# Patient Record
Sex: Male | Born: 1935
Health system: Southern US, Community
[De-identification: ages and names within clinical notes are randomized; demographics above are authoritative.]

## PROBLEM LIST (undated history)

## (undated) DIAGNOSIS — I10 Essential (primary) hypertension: Secondary | ICD-10-CM

## (undated) DIAGNOSIS — G2581 Restless legs syndrome: Secondary | ICD-10-CM

## (undated) DIAGNOSIS — H9190 Unspecified hearing loss, unspecified ear: Secondary | ICD-10-CM

## (undated) DIAGNOSIS — Z9289 Personal history of other medical treatment: Secondary | ICD-10-CM

## (undated) DIAGNOSIS — F411 Generalized anxiety disorder: Secondary | ICD-10-CM

## (undated) DIAGNOSIS — R0602 Shortness of breath: Secondary | ICD-10-CM

## (undated) DIAGNOSIS — K828 Other specified diseases of gallbladder: Secondary | ICD-10-CM

## (undated) DIAGNOSIS — F329 Major depressive disorder, single episode, unspecified: Secondary | ICD-10-CM

## (undated) DIAGNOSIS — C61 Malignant neoplasm of prostate: Secondary | ICD-10-CM

## (undated) DIAGNOSIS — F419 Anxiety disorder, unspecified: Secondary | ICD-10-CM

## (undated) DIAGNOSIS — R011 Cardiac murmur, unspecified: Secondary | ICD-10-CM

## (undated) DIAGNOSIS — G8929 Other chronic pain: Secondary | ICD-10-CM

## (undated) DIAGNOSIS — K279 Peptic ulcer, site unspecified, unspecified as acute or chronic, without hemorrhage or perforation: Secondary | ICD-10-CM

## (undated) DIAGNOSIS — K219 Gastro-esophageal reflux disease without esophagitis: Secondary | ICD-10-CM

## (undated) DIAGNOSIS — Z9181 History of falling: Secondary | ICD-10-CM

## (undated) DIAGNOSIS — Z789 Other specified health status: Secondary | ICD-10-CM

## (undated) DIAGNOSIS — C801 Malignant (primary) neoplasm, unspecified: Secondary | ICD-10-CM

## (undated) DIAGNOSIS — J849 Interstitial pulmonary disease, unspecified: Secondary | ICD-10-CM

## (undated) DIAGNOSIS — E785 Hyperlipidemia, unspecified: Secondary | ICD-10-CM

## (undated) DIAGNOSIS — R0789 Other chest pain: Secondary | ICD-10-CM

## (undated) DIAGNOSIS — K922 Gastrointestinal hemorrhage, unspecified: Secondary | ICD-10-CM

## (undated) DIAGNOSIS — E7800 Pure hypercholesterolemia, unspecified: Secondary | ICD-10-CM

## (undated) HISTORY — PX: OTHER SURGICAL HISTORY: SHX169

## (undated) HISTORY — DX: Malignant neoplasm of prostate: C61

## (undated) HISTORY — DX: Essential (primary) hypertension: I10

## (undated) HISTORY — DX: Pure hypercholesterolemia, unspecified: E78.00

## (undated) HISTORY — PX: ESOPHAGOGASTRODUODENOSCOPY: SHX1529

## (undated) HISTORY — DX: Other specified diseases of gallbladder: K82.8

## (undated) HISTORY — DX: Gastrointestinal hemorrhage, unspecified: K92.2

## (undated) HISTORY — PX: TONSILLECTOMY: SUR1361

---

## 1998-08-04 ENCOUNTER — Other Ambulatory Visit: Admission: RE | Admit: 1998-08-04 | Discharge: 1998-08-04 | Payer: Self-pay

## 2000-05-04 ENCOUNTER — Encounter: Payer: Self-pay | Admitting: Emergency Medicine

## 2000-05-04 ENCOUNTER — Emergency Department (HOSPITAL_COMMUNITY): Admission: EM | Admit: 2000-05-04 | Discharge: 2000-05-04 | Payer: Self-pay | Admitting: Emergency Medicine

## 2000-05-07 ENCOUNTER — Inpatient Hospital Stay (HOSPITAL_COMMUNITY): Admission: EM | Admit: 2000-05-07 | Discharge: 2000-05-07 | Payer: Self-pay | Admitting: Urology

## 2000-05-07 ENCOUNTER — Encounter: Payer: Self-pay | Admitting: Urology

## 2000-08-14 ENCOUNTER — Other Ambulatory Visit: Admission: RE | Admit: 2000-08-14 | Discharge: 2000-08-14 | Payer: Self-pay | Admitting: Urology

## 2000-10-09 ENCOUNTER — Ambulatory Visit (HOSPITAL_COMMUNITY): Admission: RE | Admit: 2000-10-09 | Discharge: 2000-10-09 | Payer: Self-pay | Admitting: Urology

## 2000-10-09 ENCOUNTER — Encounter: Payer: Self-pay | Admitting: Urology

## 2000-10-13 ENCOUNTER — Ambulatory Visit: Admission: RE | Admit: 2000-10-13 | Discharge: 2001-01-11 | Payer: Self-pay | Admitting: Radiation Oncology

## 2001-07-23 ENCOUNTER — Encounter: Payer: Self-pay | Admitting: Family Medicine

## 2001-07-23 ENCOUNTER — Ambulatory Visit (HOSPITAL_COMMUNITY): Admission: RE | Admit: 2001-07-23 | Discharge: 2001-07-23 | Payer: Self-pay | Admitting: Family Medicine

## 2001-08-10 ENCOUNTER — Ambulatory Visit: Admission: RE | Admit: 2001-08-10 | Discharge: 2001-11-08 | Payer: Self-pay | Admitting: Radiation Oncology

## 2001-08-17 ENCOUNTER — Ambulatory Visit (HOSPITAL_COMMUNITY): Admission: RE | Admit: 2001-08-17 | Discharge: 2001-08-17 | Payer: Self-pay | Admitting: Radiation Oncology

## 2001-08-18 ENCOUNTER — Ambulatory Visit (HOSPITAL_COMMUNITY): Admission: RE | Admit: 2001-08-18 | Discharge: 2001-08-18 | Payer: Self-pay | Admitting: Radiation Oncology

## 2002-08-25 ENCOUNTER — Encounter: Payer: Self-pay | Admitting: Family Medicine

## 2002-08-25 ENCOUNTER — Ambulatory Visit (HOSPITAL_COMMUNITY): Admission: RE | Admit: 2002-08-25 | Discharge: 2002-08-25 | Payer: Self-pay | Admitting: Family Medicine

## 2003-05-23 ENCOUNTER — Ambulatory Visit (HOSPITAL_COMMUNITY): Admission: RE | Admit: 2003-05-23 | Discharge: 2003-05-23 | Payer: Self-pay | Admitting: Family Medicine

## 2003-06-11 HISTORY — PX: COLONOSCOPY: SHX174

## 2003-07-25 ENCOUNTER — Inpatient Hospital Stay (HOSPITAL_COMMUNITY): Admission: AD | Admit: 2003-07-25 | Discharge: 2003-07-28 | Payer: Self-pay | Admitting: Family Medicine

## 2003-08-18 ENCOUNTER — Ambulatory Visit (HOSPITAL_COMMUNITY): Admission: RE | Admit: 2003-08-18 | Discharge: 2003-08-18 | Payer: Self-pay | Admitting: Internal Medicine

## 2003-09-01 ENCOUNTER — Ambulatory Visit (HOSPITAL_COMMUNITY): Admission: RE | Admit: 2003-09-01 | Discharge: 2003-09-01 | Payer: Self-pay | Admitting: Internal Medicine

## 2004-03-07 ENCOUNTER — Ambulatory Visit (HOSPITAL_COMMUNITY): Admission: RE | Admit: 2004-03-07 | Discharge: 2004-03-07 | Payer: Self-pay | Admitting: Family Medicine

## 2004-11-19 ENCOUNTER — Ambulatory Visit (HOSPITAL_COMMUNITY): Admission: RE | Admit: 2004-11-19 | Discharge: 2004-11-19 | Payer: Self-pay | Admitting: Family Medicine

## 2005-03-08 ENCOUNTER — Inpatient Hospital Stay (HOSPITAL_COMMUNITY): Admission: AD | Admit: 2005-03-08 | Discharge: 2005-03-10 | Payer: Self-pay | Admitting: Family Medicine

## 2005-03-09 ENCOUNTER — Ambulatory Visit: Payer: Self-pay | Admitting: Internal Medicine

## 2005-03-15 ENCOUNTER — Ambulatory Visit (HOSPITAL_COMMUNITY): Admission: RE | Admit: 2005-03-15 | Discharge: 2005-03-15 | Payer: Self-pay | Admitting: Internal Medicine

## 2007-01-22 ENCOUNTER — Ambulatory Visit (HOSPITAL_COMMUNITY): Admission: RE | Admit: 2007-01-22 | Discharge: 2007-01-22 | Payer: Self-pay | Admitting: Family Medicine

## 2007-09-10 ENCOUNTER — Ambulatory Visit (HOSPITAL_COMMUNITY): Admission: RE | Admit: 2007-09-10 | Discharge: 2007-09-10 | Payer: Self-pay | Admitting: Family Medicine

## 2007-09-15 ENCOUNTER — Encounter: Admission: RE | Admit: 2007-09-15 | Discharge: 2007-09-15 | Payer: Self-pay | Admitting: Family Medicine

## 2007-09-29 ENCOUNTER — Encounter: Admission: RE | Admit: 2007-09-29 | Discharge: 2007-09-29 | Payer: Self-pay | Admitting: Family Medicine

## 2007-10-12 ENCOUNTER — Encounter: Admission: RE | Admit: 2007-10-12 | Discharge: 2007-10-12 | Payer: Self-pay | Admitting: Family Medicine

## 2007-12-31 ENCOUNTER — Encounter (INDEPENDENT_AMBULATORY_CARE_PROVIDER_SITE_OTHER): Payer: Self-pay | Admitting: Family Medicine

## 2007-12-31 ENCOUNTER — Ambulatory Visit (HOSPITAL_COMMUNITY): Admission: RE | Admit: 2007-12-31 | Discharge: 2007-12-31 | Payer: Self-pay | Admitting: Family Medicine

## 2008-09-19 ENCOUNTER — Encounter (HOSPITAL_COMMUNITY): Admission: RE | Admit: 2008-09-19 | Discharge: 2008-10-19 | Payer: Self-pay | Admitting: Urology

## 2010-06-30 ENCOUNTER — Encounter (INDEPENDENT_AMBULATORY_CARE_PROVIDER_SITE_OTHER): Payer: Self-pay | Admitting: Internal Medicine

## 2010-07-01 ENCOUNTER — Encounter: Payer: Self-pay | Admitting: Family Medicine

## 2010-08-15 ENCOUNTER — Ambulatory Visit (HOSPITAL_COMMUNITY)
Admission: RE | Admit: 2010-08-15 | Discharge: 2010-08-15 | Disposition: A | Discharge status: Home / Self Care | Payer: Medicare Other | Source: Ambulatory Visit | Attending: Family Medicine | Admitting: Family Medicine

## 2010-08-15 ENCOUNTER — Other Ambulatory Visit (HOSPITAL_COMMUNITY): Payer: Self-pay | Admitting: Family Medicine

## 2010-08-15 DIAGNOSIS — Z8546 Personal history of malignant neoplasm of prostate: Secondary | ICD-10-CM | POA: Insufficient documentation

## 2010-08-15 DIAGNOSIS — K5730 Diverticulosis of large intestine without perforation or abscess without bleeding: Secondary | ICD-10-CM | POA: Insufficient documentation

## 2010-08-15 DIAGNOSIS — R109 Unspecified abdominal pain: Secondary | ICD-10-CM | POA: Insufficient documentation

## 2010-08-15 DIAGNOSIS — R58 Hemorrhage, not elsewhere classified: Secondary | ICD-10-CM

## 2010-08-15 DIAGNOSIS — N323 Diverticulum of bladder: Secondary | ICD-10-CM | POA: Insufficient documentation

## 2010-08-15 DIAGNOSIS — R933 Abnormal findings on diagnostic imaging of other parts of digestive tract: Secondary | ICD-10-CM | POA: Insufficient documentation

## 2010-08-15 DIAGNOSIS — N200 Calculus of kidney: Secondary | ICD-10-CM | POA: Insufficient documentation

## 2010-08-15 DIAGNOSIS — K429 Umbilical hernia without obstruction or gangrene: Secondary | ICD-10-CM | POA: Insufficient documentation

## 2010-08-15 DIAGNOSIS — K625 Hemorrhage of anus and rectum: Secondary | ICD-10-CM | POA: Insufficient documentation

## 2010-08-15 MED ORDER — IOHEXOL 300 MG/ML  SOLN: 100.0000 mL | Freq: Once | INTRAMUSCULAR | Status: AC | PRN

## 2010-09-03 ENCOUNTER — Encounter (INDEPENDENT_AMBULATORY_CARE_PROVIDER_SITE_OTHER): Payer: Self-pay | Admitting: *Deleted

## 2010-09-04 ENCOUNTER — Ambulatory Visit: Admitting: Gastroenterology

## 2010-09-11 NOTE — Letter (Signed)
Summary: Generic Letter, Intro to Referring  [facility]  [address]   [place], [place] [address]   Phone: [phone]  Fax: [phone]      September 03, 2010             RE: Jeffrey Sherman   [birth year]                 [address]                 [place], [place]  [address]  Dear Kemper Durie,  Pt has cancelled his appointment for tomorrow 09/04/10 @ 1030 w/LSL            Sincerely,    Diana Eves  [facility] Ph: [phone]   Fax: [phone]

## 2010-10-26 NOTE — Op Note (Signed)
[facility]  Patient:    Jeffrey Sherman, Jeffrey Sherman                        MRN: [id number] Proc. Date: 05/07/00 Adm. Date:  [id number] Disc. Date: [id number] Attending:  Benny Lennert                           Operative Report  PREOPERATIVE DIAGNOSIS:  Left distal ureteral calculus.  POSTOPERATIVE DIAGNOSIS:  Left distal ureteral calculus.  PROCEDURE PERFORMED:  Cystoscopy, ureteroscopy, stone basketing, and double-J stent placement.  SURGEON:  Barron Alvine, M.D.  ANESTHESIA:  General endotracheal.  INDICATIONS:  The patient is a 77 year old male.  He was recently evaluated in our office due to a distal left ureteral stone.  The stone was 4 mm in size and recommended continued efforts at spontaneous passage.  He contacted [place] today stating he could no longer stand the pain.  For that reason, we admitted him to [facility].  X-rays showed continued presence of a distal ureteral stone.  The patient requested intervention.  He understood the advantages, disadvantages, as well as full risks, and full informed consent was obtained.  DESCRIPTION OF PROCEDURE AND FINDINGS:  The patient was brought to the operating room where he had the successful induction of general endotracheal anesthesia.  He was placed in the lithotomy position, prepped and draped in the usual manner.  Cystoscopy revealed a relatively unremarkable prostatic urethra.  The patient did have considerable edema of the left hemitrigone.  At the ureteral orifice, one could see a stone just proximal.  For that reason, retrograde pyelogram was not performed.  With some manipulation, a guidewire was able to be passed beyond the stone and into the left renal unit with fluoroscopic guidance.  We then utilized a 6.5-French rigid ureteroscope.  We were able to insert this into the distal ureter without the need for dilation. A 4 mm hard stone was encountered.  This was basket extracted  without difficulty.  The stone appeared to be ________ composition.  Over the guidewire, a 24 cm, 6-French double-J stent was placed.  A dangle string was left which was secured to the penis.  The stent was placed with visual as well as fluoroscopic guidance and appeared to be in good position.  The patient tolerated the procedure well.  He was brought to the recovery room in stable condition. DD:  05/07/00 TD:  05/07/00 Job: [id number]

## 2010-10-26 NOTE — H&P (Signed)
NAME:  Jeffrey Sherman, Jeffrey Sherman                         ACCOUNT NO.:  [id number]   MEDICAL RECORD NO.:  [id number]                  PATIENT TYPE:   LOCATION:  339                                  FACILITY:  [facility]   PHYSICIAN:  Patrica Duel, M.D.                 DATE OF BIRTH:  Dec 29, 1929   DATE OF ADMISSION:  DATE OF DISCHARGE:                                HISTORY & PHYSICAL   CHIEF COMPLAINT:  Diarrhea.   HISTORY OF PRESENT ILLNESS:  This is a 77 year old male with a history of  hypertension, disc disease, osteoarthritis, and prostate carcinoma medically  treated.  He has been quite stable over the past several years.  He suffered  the loss of his wife approximately 2 years ago and suffered from depression  but this has been well controlled of recent.   The patient presented to the office on July 19, 2003 with a 5- to 6-day  history of diarrhea.  This consisted of 4-5 watery stools daily.  He has had  ongoing abdominal cramping.  Of note, the patient had taken a course of  amoxicillin approximately 7 days prior to the onset of diarrhea.  He denied  fever, chills, melena, or hematemesis.   The patient was worked up with CBC which revealed a mild anemia (hemoglobin  11.3) with a prior hemoglobin check in June 2004 of 13.  Chemistries WRE  normal.  Stool for C. difficile, O&P negative; C&S is negative for  suspicious cultures at this time - final report is pending.   The patient was treated empirically with Flagyl and Imodium as well as  dietary instructions.  He has continued to experience 4-5 watery stools per  day without gross blood.  His only abdominal pain is of cramping quality.  He does note high-intensity borborygmi but is devoid of other complaints.  He is generally weak and is in need of admission for hydration and a full  workup of intractable diarrhea.  He is very reluctant to undergo colonoscopy  at this time though this is probably indicated.   There is no history of  headache, neurologic deficits, shortness of breath,  chest pain, or genitourinary symptoms.   PAST MEDICAL HISTORY:  As documented above.   CURRENT MEDICATIONS:  1. Terazosin 5 mg p.o. b.i.d.  2. Verapamil SR 120 one t.i.d.  3. Lisinopril 20 mg one q.i.d.  4. Hydrochlorothiazide 25 daily.  5. Celebrex 200 mg daily p.r.n. (not taken recently).  6. Potassium 20 mEq one b.i.d.  7. Aspirin 81 mg daily.  8. Effexor XR 75 mg daily.   SOCIAL HISTORY:  Nonsmoker, nondrinker.   FAMILY HISTORY:  Significant for coronary disease in his mother.  One  brother died of carcinoma, unknown origin.  One brother died of heart  attack.   REVIEW OF SYSTEMS:  Negative except as mentioned.   PHYSICAL EXAMINATION:  GENERAL:  A very pleasant white male  who is alert and  oriented, no acute distress.  HEENT:  His mucous membranes are mildly dehydrated.  Ears, nose, and throat  benign.  There is no scleral icterus.  NECK:  Supple, no bruits.  LUNGS:  Clear.  HEART:  Heart sounds are normal without murmurs, rubs, or gallops.  ABDOMEN:  Mildly hypertympanitic with increased bowel sounds.  There is mild  periumbilical tenderness only.  EXTREMITIES:  No clubbing, cyanosis, or edema.  NEUROLOGIC:  Within normal limits.   ASSESSMENT:  Intractable diarrhea with negative Clostridium difficile, ova  and parasites, culture and sensitivity, and mild anemia documented on recent  laboratory.  Admit for hydration, GI consult.  Will continue empiric Flagyl  and Lomotil.  Will follow and treat expectantly.     ___________________________________________                                         Patrica Duel, M.D.   MC/MEDQ  D:  07/25/2003  T:  07/25/2003  Job:  [id number]

## 2010-10-26 NOTE — Op Note (Signed)
Jeffrey Sherman, Jeffrey Sherman               ACCOUNT NO.:  [id number]   MEDICAL RECORD NO.:  [id number]          PATIENT TYPE:  INP   LOCATION:  A210                          FACILITY:  [facility]   PHYSICIAN:  R. Roetta Sessions, M.D. DATE OF BIRTH:  07-19-1929   DATE OF PROCEDURE:  03/09/2005  DATE OF DISCHARGE:                                 OPERATIVE REPORT   PROCEDURE:  Esophagogastroduodenoscopy with ablation arteriovenous  malformation and Maloney dilation.   ENDOSCOPIST:  Jonathon Bellows, M.D.   INDICATIONS FOR PROCEDURE:  The patient is a 77 year old gentleman who  experienced recent syncope recently in the setting of melena.  He was seen  by Dr. Phillips Odor and was found to have current jelly, grossly guaiac-positive  stool on rectal exam in his office.  He was admitted for further evaluation.  We have seen him.  He does take aspirin daily.  Admitting hemoglobin 12.9.  It is 11.5 this morning with INR of 1.2.  He has remained stable and has not  had any more blood per rectum.  He also reports intermittent esophageal  dysphagia to solids.  Colonoscopy in February 2005 demonstrated sigmoid  diverticula and hemorrhoids.  An EGD is now being done.  This procedure was  discussed with the patient and the patient's family at length.  Potential  risks, benefits, and alternatives have been reviewed and questioned  answered.   PROCEDURE NOTE:  Oxygen saturation, blood pressure, pulse, respirations were  monitored throughout the entirety of the procedure.   MEDICATIONS:  Conscious sedation:  Versed 3 mg IV, Demerol 25 mg IV in  divided doses.  Cetacaine spray for topical oropharyngeal anesthesia.   INSTRUMENT:  Olympus video chip system.   FINDINGS:  Examination of the tubular esophagus revealed no mucosal  abnormalities.  There was no ring, stricture, or other abnormality.  The EG  junction was easily traversed to enter the stomach and gastric cavity.  The  stomach inflated well with air.  It was  empty.  A thorough examination of  the gastric mucosa, including retroflexion view of the proximal stomach and  esophagogastric junction demonstrated only a solitary non-bleeding AVM in  the antrum.  The pylorus was patent and easily traversed. Examination of the  bulb and second portion appeared normal.   THERAPY/DIAGNOSTIC MANEUVERS PERFORMED:  The solitary AVM was ablated with  the Gold probe.  Subsequently a 56-French Maloney dilator was passed to full  insertion with good patient tolerance and without blood or trauma.  A look  back revealed no apparent complications related to the passage of the  dilator.  The patient tolerated the procedure well, and was reacted.   IMPRESSION:  1.  Normal esophagus, status post passage of a 56-French Maloney dilator.  2.  Solitary antral arteriovenous malformation (non-bleeding), otherwise      normal gastric mucosa, status post ablation of the arteriovenous      malformation as described above.  3.  Patent pylorus.  4.  Normal D1 and D2.   Today's findings do not explain the patient's GI bleed.  Would wonder about  the  possibility of a small bowel erosion, AVM, or even a slow diverticular  bleed to explain the bulk of his recent bleeding.  At any rate, his bleeding  seems to have cease.   RECOMMENDATIONS:  1.  Discontinue aspirin.  2.  Regular diet.  3.  Outpatient Gibbon small bowel  capsule study.  4.  Check hemoglobin and hematocrit tomorrow morning.      Jonathon Bellows, M.D.  Electronically Signed     RMR/MEDQ  D:  03/09/2005  T:  03/09/2005  Job:  [id number]   cc:   Corrie Mckusick, M.D.  Fax: [phone]

## 2010-10-26 NOTE — Consult Note (Signed)
Jeffrey Sherman, Jeffrey Sherman               ACCOUNT NO.:  [id number]   MEDICAL RECORD NO.:  [id number]          PATIENT TYPE:  INP   LOCATION:  A210                          FACILITY:  [facility]   PHYSICIAN:  R. Roetta Sessions, M.D. DATE OF BIRTH:  May 26, 1930   DATE OF CONSULTATION:  03/08/2005  DATE OF DISCHARGE:                                   CONSULTATION   CONSULTING PHYSICIAN:  R. Roetta Sessions, M.D.   REFERRING PHYSICIAN:  Corrie Mckusick, M.D.   REASON FOR CONSULTATION:  GI bleed.   HISTORY OF PRESENT ILLNESS:  The patient is a 77 year old Caucasian  gentleman who was admitted with syncope and GI bleed.  Approximately three  days ago, he developed two episodes of black tarry stools, followed by  bright red blood per rectum.  He had a syncopal episode.  Yesterday he had  another episode of fresh blood per rectum.  He presented to Dr. Lamar Blinks  office this morning for evaluation.  Rectal examination, by Melony Overly,  P.A., revealed fresh blood per rectum.  He was admitted for further  evaluation.  Before Tuesday, the patient was having regular bowel movements  and no abdominal pain until he developed tarry stools.  He describes mid  abdominal tenderness.  He also is having gas pains.  He denies any  heartburn.  He had a single episode of vomiting Tuesday as well.  He denies  any hematemesis.  He has intermittent esophageal dysphagia.  He has  difficulty swallowing solid foods intermittently and gets __________  with  liquids occasionally.  He complains of about a one to two-month history of  palpitations, denies any chest pain.   MEDICATIONS PRIOR TO ADMISSION:  1.  Terazosin 5 mg, two tablets b.i.d.  2.  Lisinopril 20 mg every day.  3.  Potassium chloride 20 mEq every day.  4.  Effexor XR 75 mg every day.  5.  Prevacid 30 mg every day.  6.  Aspirin 81 mg every day.  7.  __________  500 mg two p.r.n. but rarely takes for pain.  8.  Verapamil SR 240 mg every day.  9.   Hydrochlorothiazide 25 mg every day.  10. Garlique every day.   ALLERGIES:  No known drug allergies.   PAST MEDICAL HISTORY:  1.  He was in the hospital, in February 2005, for diarrhea.  At that time,      he had a colonoscopy, by Dr. Karilyn Cota, which revealed sigmoid      diverticulosis, hemorrhoids, a few focal areas of nonspecific mucosal      erythema.  2.  In March 2005, a GI series with small-bowel follow-through which      revealed a single episode of gastroesophageal reflux.  The patient states his diarrhea has resolved.  1.  Hypertension.  2.  Osteoarthritis.  3.  Prostate cancer, diagnosed in 2002.  He receives Lupron injections every      four months.  4.  History of bleeding duodenal ulcer in the 1980s, treated by Dr. Park Breed,      according to the patient.  5.  Left ureteral stone, status post cystoscopy.   FAMILY HISTORY:  Brother with prostate cancer.   SOCIAL HISTORY:  He is widowed.  He has three children locally.  He has one  son he has not heard from in over five years.  Denies any tobacco or alcohol  use.   REVIEW OF SYSTEMS:  See HPI for GI and cardiopulmonary.  CONSTITUTIONAL:  Denies any weight loss.   PHYSICAL EXAMINATION:  VITAL SIGNS:  Weight 174.4, temp 97.3, pulse 63,  respirations 20, blood pressure 130/70.  GENERAL:  A pleasant, well nourished, well developed, Caucasian male in no  acute distress.  SKIN:  Warm and dry.  No jaundice.  HEENT:  Sclerae nonicteric.  Oropharyngeal mucosa moist and pink.  No  lymphadenopathy.  CHEST:  Lungs are clear to auscultation.  CARDIAC:  Reveals a regular rate and rhythm.  No murmurs, rubs, or gallops.  ABDOMEN:  Positive bowel sounds.  Soft, nontender, nondistended.  No  organomegaly or masses.  No rebound tenderness or guarding.  No abdominal  bruits or hernias.  EXTREMITIES:  No edema.  RECTAL:  Done at the primary medical doctor's office today.  According to  the patient, he was found to have fresh blood per  rectum.   LABORATORY:  White count 14,500, hemoglobin 12.9, hematocrit 37.8, platelets  239,000.  Sodium 136, potassium 3.1, BUN 28, creatinine 1.7, glucose 117.  Total bilirubin 0.5, alkaline phosphatase 53, AST 20, ALT 15, albumin 3.5.  Cardiac enzymes negative x1.  Chest x-ray revealed an area of chronic  atelectasis, infiltrate, or scarring inferior medial aspect of the right  middle lobe which was seen on prior CT in March 2005, a moderate degree of  COPD.   IMPRESSION:  The patient is a 77 year old gentleman who had two episodes of  melena, followed by fresh blood per rectum and syncope.  He had fresh blood  per rectum again yesterday.  His hemoglobin is mildly low.  He gives a  history of bleeding duodenal ulcer in the past.  He is on Prevacid and  aspirin daily.  Given a history of melena, would suspect upper  gastrointestinal bleed.  Reassuringly he had a colonoscopy, February 2005,  which revealed sigmoid diverticula and hemorrhoids.   RECOMMENDATIONS:  1.  IV Protonix.  2.  Replete potassium.  3.  Repeat CBC in the morning.  4.  EGD.  Further recommendations to follow.   I would like to thank Dr. Assunta Found for allowing [place] to take part in the  care of this patient.      Tana Coast, P.AJonathon Bellows, M.D.  Electronically Signed    LL/MEDQ  D:  03/08/2005  T:  03/08/2005  Job:  [id number]

## 2010-10-26 NOTE — H&P (Signed)
NAME:  Jeffrey Sherman,  NO.:  [id number]   MEDICAL RECORD NO.:  [id number]          PATIENT TYPE:  INP   LOCATION:  A210                          FACILITY:  [facility]   PHYSICIAN:  Corrie Mckusick, M.D.  DATE OF BIRTH:  Feb 04, 1930   DATE OF ADMISSION:  03/08/2005  DATE OF DISCHARGE:  LH                                HISTORY & PHYSICAL   Mr. Canterbury is a 77 year old white male who is being admitted from our office  for rectal bleeding, weakness, and syncope for three days.   HISTORY OF PRESENT ILLNESS:  Mr. Kubisiak was seen in our office, on March 07, 2005, for the above symptoms which began on March 05, 2005.  He  states that he passed out one time at home and had an unknown length of time  of loss of consciousness.  He has gotten progressively weaker over the past  several days or so and has noticed bright red rectal bleeding also for 2-3  days.  He also reported some palpitations without chest pain at the office  visit on March 07, 2005, and also nausea and vomiting x1.  He was worked  up in our office but adamantly refused admission yesterday but agreed to go  in today.  He is thus being admitted directly from our office for further  evaluation and treatment.   PAST MEDICAL HISTORY:  1.  Hypertension.  2.  Prostate cancer, diagnosed in 1998 for which he is on Lupron injections      every four months.  3.  GI bleed in the 1980s.  4.  Rectal bleeding in February 2005 and was admitted to [facility]      for the same.  He was worked up and evaluated by Dr. Karilyn Cota with      colonoscopy revealing sigmoid diverticula and moderate sized external      hemorrhoids.  5.  Osteoarthritis.   SURGICAL HISTORY:  Negative.   FAMILY HISTORY:  Noncontributory.   SOCIAL HISTORY:  He is a widower for approximately three years.  He is  accompanied by one of his daughters today.  Of note, it was very important  to him that he eat out with his family last  evening and that is why he was  adamant about not being admitted yesterday.  He denies any tobacco or  alcohol use.   REVIEW OF SYSTEMS:  He denies any sore throat, earache, no headaches, no  blurred vision, no dizziness, no chest pain, but has had palpitations off  and on for several months worsening in the past week or so.  No shortness of  breath, cough, hemoptysis.  He has had some diarrhea over the past several  days associated with the bright red bleeding on the toilet paper and in the  toilet, last episode was on March 07, 2005, in the morning and at the  time of admission he has had no further diarrhea stools.  No nausea or  vomiting at this time.  No bladder problems.  No muscle weakness joint pain,  no fevers, weight  loss, or other constitutional symptoms.   CURRENT MEDICATIONS:  1.  Verapamil SR 240 mg p.o. every day.  2.  HCTZ 25 mg p.o. every day.  3.  Hytrin 5 mg.  He normally takes one p.o. b.i.d. p.r.n. for blood      pressure but is allowed to take as many as four in the morning and two      in the evening, according to the patient and his daughter.  4.  Lisinopril 20 mg, two p.o. q.a.m.  5.  Potassium XR 20 mEq two p.o. every day.  6.  Effexor 75 mg, two every day.  7.  Prevacid 30 mg p.o. every day.  8.  Aspirin 81 mg p.o. every day, last taken on March 06, 2005.   He has no known drug allergies.   PHYSICAL EXAMINATION:  GENERAL:  He is alert and oriented, very pleasant,  elderly male accompanied by his daughter in no acute distress.  VITAL SIGNS:  He is afebrile, pulse is 64, BP is 100/62, weight is 180 up  from 177 yesterday in our office.  HEENT:  PERRLA.  EOMI.  Conjunctivae are pale.  Pharynx is within normal  limits.  NECK:  Supple without adenopathy.  HEART:  Regular rate and rhythm without murmur, gallop or rub.  LUNGS:  Clear to auscultation without crackles, wheezes, rales, or rhonchi.  ABDOMEN:  Bowel sounds are positive, soft.  He has  generalized mild  tenderness all throughout the four quadrants.  No masses or organomegaly are  palpable.  EXTREMITIES:  No edema, cyanosis, or clubbing.  BACK:  Negative for CVA tenderness.  SKIN:  Warm and dry.  NEURO:  CN II-XII are grossly normal.  Gait is normal.  RECTAL:  No obvious hemorrhoids externally.  Rectal tone is within normal  limits.  He does have bright red blood obvious on my glove after exam  currant jelly type and guaiac is positive.   LABORATORY:  On March 07, 2005, hematocrit, in our office, was 42%.  EKG, on March 07, 2005, revealed anterolateral ST changes as compared to  an old EKG from our office 2003.   ASSESSMENT:  1.  Rectal bleeding.  2.  Syncope x1.  3.  History of gastrointestinal bleed in 2005 and earlier in the 1980s.  4.  Hypertension.  5.  Prostate cancer, under the care of urology on Lupron injections every      four months.   PLAN:  1.  He is admitted to Dr. Phillips Odor.  2.  CBC, C-MET, troponin level, and cardiac enzymes on admission.  3.  Consult Dr. Karilyn Cota.  4.  Telemetry monitoring.  5.  Continue all above current medications.  6.  [facility] standing orders.  7.  Clear liquid diet.  8.  Obtain chest x-ray on admission.  9.  Coordination of care is provided.      Melony Overly, [place]      Corrie Mckusick, M.D.  Electronically Signed    TH/MEDQ  D:  03/08/2005  T:  03/08/2005  Job:  [id number]   cc:   Lionel December, M.D.  [address]  [place]  [place] [address]   Ky Barban, M.D.  Fax: [phone]

## 2010-10-26 NOTE — Op Note (Signed)
NAME:  MATTHER, LABELL                        ACCOUNT NO.:  [id number]   MEDICAL RECORD NO.:  [id number]                    PATIENT TYPE:  PINP   LOCATION:  339                                  FACILITY:   PHYSICIAN:  Lionel December, M.D.                 DATE OF BIRTH:   DATE OF PROCEDURE:  07/27/2003  DATE OF DISCHARGE:                                 OPERATIVE REPORT   PROCEDURE:  Total colonoscopy.   ENDOSCOPIST:  Lionel December, M.D.   INDICATIONS:  This patient is a 77 year old Caucasian male admitted with  about a 10-day history of diarrhea which now reportedly has become bloody.  A stool study has been negative including C. difficile toxin titer.  He was  recently treated with antibiotic; however, he has not responded to Flagyl.  He is now having bloody diarrhea.  The procedure and risks were reviewed  with the patient and informed consent was obtained.   PREOPERATIVE MEDICATIONS:  Demerol 50 mg IV and Versed 8 mg IV in divided  dose.   FINDINGS:  Procedure performed in endoscopy suite.  The patient's vital  signs and O2 saturation were monitored during the procedure and remained  stable.  The patient was placed in the left lateral recumbent position and  rectal examination was performed.  No abnormality noted on external or  digital exam.   Olympus videoscope was placed in the rectum and advanced under vision into  the sigmoid colon where multiple diverticular were noted.  There are a few  focal areas of mucosal erythema, but no erosions, ulcers, or membranes were  noted.  This yellowish-reddish material was noted throughout the colon.  Sample was taken and guaiac test was done and was negative. I felt that this  was red-yellow and not blood.   The scope was passed all the way to the cecum which was identified by  appendiceal orifice and the ileocecal valve.  Pictures were taken for the  record.  As the scope was withdrawn, the colonic mucosa was once again  carefully  examined and there were no endoscopic changes of colitis.  There  were also no polyps and/or tumor masses.  The rectal mucosa was normal.  The  scope was retroflexed to exam the anorectal junction and moderate sized  hemorrhoids were noted below the dentate line.   The endoscope was straightened and withdrawn.  The patient tolerated the  procedure well.   FINAL DIAGNOSES:  1. No endoscopic evidence of colitis.  2. Sigmoid colon diverticulosis and moderate size external hemorrhoids.  3. A few focal areas of mucosal erythema which is felt to be a nonspecific     finding and would not explain his diarrhea.   This is possibly small bowel diarrhea or post infectious viral.   RECOMMENDATIONS:  1. Will advance his diet to 4 gm sodium and start him on __________ 2 mg  p.o. q.i.d. Will start him on Metamucil, etc. at a later date.  2. Will discontinue his Questran and Lactinex.  3. CBC and MET-7 will be checked in the morning along with serum magnesium.      ___________________________________________                                            Lionel December, M.D.   NR/MEDQ  D:  07/27/2003  T:  07/28/2003  Job:  [id number]   cc:   Angus G. Renard Matter, M.D.  [address]  [place]  [place] [address]  Fax: [phone]

## 2010-10-26 NOTE — Discharge Summary (Signed)
NAME:  Jeffrey Sherman, Jeffrey Sherman NO.:  [id number]   MEDICAL RECORD NO.:  [id number]          PATIENT TYPE:  INP   LOCATION:  A210                          FACILITY:  [facility]   PHYSICIAN:  Corrie Mckusick, M.D.  DATE OF BIRTH:  [birth year]   DATE OF ADMISSION:  03/08/2005  DATE OF DISCHARGE:  10/01/2006LH                                 DISCHARGE SUMMARY   For history of present illness and past medical history, please see  admission H&P.   HOSPITAL COURSE:  A 77 year old gentleman with hypertension, prostate  cancer, GI bleed in the 1980s with a rectal bleed in February of 2005, and  osteoarthritis who presented to the office with recurrent GI bleeding. He  was seen by our physician's assistance who did the initial H&P. She was  admitted with cardiac enzymes as well as initial blood work. Dr. Karilyn Cota was  consulted on admission as well, and he was kept on a clear diet. Please see  Dr. Luvenia Starch note on admission for initial consultation details. EGD and  colonoscopy if needed on the day after admission.   The following day, the patient felt quite well. IV fluids were continued.  Potassium was increased as it had decreased to 2.8. A small amount of AVMs  were seen on EGD. Advanced the patient to a regular diet. Decided for an  outpatient capsule study. Hemoglobin and hematocrit remained stable. The GI  specialist felt like the patient could be discharged on the first as he had  remained stable. He was discharged on potassium chloride. He was to follow  up with Dr. Sherwood Gambler in one week. Follow up with Dr. Jena Gauss in one week for  capsule study. Please see note on day of discharge for details.      Corrie Mckusick, M.D.  Electronically Signed     JCG/MEDQ  D:  05/01/2005  T:  05/01/2005  Job:  [id number]

## 2010-10-26 NOTE — Consult Note (Signed)
NAME:  Jeffrey Sherman, Jeffrey Sherman                         ACCOUNT NO.:  [id number]   MEDICAL RECORD NO.:  [id number]                   PATIENT TYPE:  INP   LOCATION:  A339                                 FACILITY:  [facility]   PHYSICIAN:  Jeffrey Sherman, M.D.              DATE OF BIRTH:  May 24, 1930   DATE OF CONSULTATION:  DATE OF DISCHARGE:                                   CONSULTATION   GI CONSULT   REQUESTING PHYSICIAN:  Jeffrey Sherman, M.D.   REASON FOR CONSULTATION:  Diarrhea.   HISTORY OF PRESENT ILLNESS:  This patient is a 77 year old Caucasian male  who was admitted today for a 2-week history of large volume, loose watery  diarrhea.  He was seen in outpatient consultation on February 2 by Dr.  Nobie Sherman.  At that point in time he was treated for C. difficile colitis and  stool studies were obtained.  He has received 5 days of Flagyl.  He has also  been using Imodium, Pepto and Lomotil with minimal relief.  He reports large  volume diarrhea 5-10 times a day most days, however, 2-3 times a day on  other days.  He denies any mucous, bleeding, or melena. Stool studies were  negative for ova and parasites.  Culture and sensitivity is initially  negative; however, final studies are pending.  C. difficile is initially  negative.  He does report associated abdominal cramping; however, this is  relieved post defecation.   PAST MEDICAL HISTORY:  He does have a past medical history of what he  believes were duodenal ulcers, diagnosed by Dr. Park Sherman in the 1980's; however,  do not have records of this.  He denies any nausea or vomiting.  He denies  any problems with dysphagia or odynophagia.  He has never had a colonoscopy.  Questionable duodenal ulcers in the 1980s by Dr. Park Sherman, prostate carcinoma  diagnosed 2-3 years ago.  Currently on Lupron for the last year.  Disk  disease, osteoarthritis, hypertension, situational depression when he lost  his wife approximately 2 years ago, hypokalemia.   PAST  SURGICAL HISTORY:  Denies.   MEDICATIONS PRIOR TO ADMISSION:  1. Terazosin 5 mg p.o. b.i.d.  2. Verapamil 120 mg t.i.d.  3. Lisinopril 20 p.o. q.i.d.  4. Hydrochlorothiazide 25 mg daily.  5. Celebrex 200 mg p.Jeffreyn. on a very intermittent infrequent basis.  6. KCl 20 mEq p.o. b.i.d.  7. Aspirin 81 mg daily.  8. Effexor XR 75 mg daily.  9. Pepto p.Jeffreyn.  10.      Lomotil p.Jeffreyn.  11.      Imodium p.Jeffreyn.   ALLERGIES:  No known drug allergies.   FAMILY HISTORY:  No known family history of colorectal carcinoma, or similar  GI problems.  Mother deceased at age 62 secondary to coronary artery  disease.  Father had an __________ disease secondary to unknown etiology.  He has 1 sister who is  healthy and 5 siblings who are deceased with  significant history for prostate carcinoma.   SOCIAL HISTORY:  Denies any alcohol, tobacco or drug use.  The patient is a  widower who lives alone.  He has 3 healthy, grown children.   REVIEW OF SYSTEMS:  CONSTITUTIONAL:  He denies any fever or chills.  Weight  has been steady.  Denies any changes in appetite.  CARDIOVASCULAR:  Denies  any chest pain or palpitations.  RESPIRATORY:  Denies any shortness of  breath, dyspnea, cough, or hemoptysis.  GENITOURINARY:  Denies any dysuria,  hematuria, increased urinary frequency.  SKIN:  Denies any rash or jaundice.   PHYSICAL EXAMINATION:  VITAL SIGNS:  Weight 184.4 pounds.  Height 69 inches.  Temperature 97.1, pulse 61, respirations 20, blood pressure 120/70.  GENERAL:  This patient is a well-developed, well-nourished, Caucasian male  in no acute distress.  he does appear slightly pale today.  He is  accompanied by his daughter-in-law.  HEENT:  Sclerae are clear.  Nonicteric.  Conjunctivae pink.  Oropharynx pink  and moist without any lesions.  NECK:  Supple without any masses or thyromegaly.  HEART:  Regular rate and rhythm with normal S1-S2, without any murmurs,  clicks, rubs or gallops.  LUNGS:  Clear to  auscultation bilaterally.  ABDOMEN:  Positive bowel sounds x4, although he does have increased bowel  sounds, left lower quadrant as well as tympany.  He denies any tenderness.  Abdomen is nondistended.  No palpable mass or hepatosplenomegaly. No rebound  tenderness.  EXTREMITIES:  No edema.  Good pulses bilaterally.  SKIN:  Pale, warm and dry, without any rash or jaundice.   LABORATORY STUDIES:  WBC 7.3, hemoglobin 12, hematocrit 34.6, calcium 8.5.  Sodium 141, potassium 2.9, chloride 103, CO2 30, BUN 8, creatinine 1.3,  glucose 100.  Total bilirubin 0.4, total protein 5.9, alkaline phosphatase  64, SGOT 18, SGPT 12, albumin 3.0, stool studies for HPI.   ASSESSMENT:  1. [name] is a 77 year old Caucasian male with a 2-week history of large     volume watery diarrhea:  Agree with possible C. difficile colitis not     identified on initial assay, or antibiotic induced colitis, or possible,     but less likely inflammatory bowel disease.  I discussed colonoscopy with     the patient as well as his son and daughter-in-law.  At this point in     time, he is declining the procedure.  I also offered sigmoidoscopy and     explained both procedures in detail including risks and benefits with the     patient.  He is refusing; however, he will continue to consider.  2. Hypokalemia.  Potassium today is 2.9.  The patient does have a history of     this.  Can be addressed by Dr. Nobie Sherman.  3. Mild anemia.  Hemoglobin 12 and hematocrit 34.6 this is down from the     patient's baseline which apparently is around 13.   RECOMMENDATIONS:  1. We will recheck C. difficile.  2. Will follow up on C&S stool.  3. Continue Flagyl for now.  4. Start Questran 5 g daily.  Do not give 2 hours prior-to-or-post any other     medications.  5. Can try probiotics per Dr. Luvenia Sherman recommendations:  Lactinex 2 tablets     with meals or t.i.d. 6. Recommend colonoscopy or at least sigmoidoscopy; will follow up with      patient tomorrow.  7.  Will check Hemoccult stools x3.  8. Further recommendations to follow.     ________________________________________  ___________________________________________  Jeffrey Sherman, N.P.                  Jonathon Bellows, M.D.   KC/MEDQ  D:  07/25/2003  T:  07/25/2003  Job:  [id number]   cc:   Jeffrey Sherman, M.D.  [address]  [place]  [place] [address]  Fax: [phone]   Jeffrey Sherman, M.D.  [address]  [place]  [place] [address]  Fax: [phone]

## 2010-10-26 NOTE — Discharge Summary (Signed)
NAME:  Jeffrey Sherman, Jeffrey Sherman                         ACCOUNT NO.:  [id number]   MEDICAL RECORD NO.:  [id number]                   PATIENT TYPE:  INP   LOCATION:  A339                                 FACILITY:  [facility]   PHYSICIAN:  Patrica Duel, M.D.                 DATE OF BIRTH:  05-Oct-1929   DATE OF ADMISSION:  07/25/2003  DATE OF DISCHARGE:  07/28/2003                                 DISCHARGE SUMMARY   DISCHARGE DIAGNOSES:  1. Diarrhea of questionable etiology, possible secretory.  2. Colonoscopy essentially negative.  3. Culture is negative.  4. History of hypertension.  5. Osteoarthritis.  6. Prostate carcinoma medically treated.   HISTORY:  Details regarding admission, please refer to the admitting note.  Briefly this 77 year old male, with a history as noted above, presented to  the office with a 5-to-6-day history of diarrhea on February 8.  This  consisted of 4-5 watery stools daily.  He has had ongoing cramping. He had  taken a course of amoxicillin 7 days prior to the onset of diarrhea.  That  day the patient was tested with CBC which revealed a mild anemia (hemoglobin  11.3 with a prior hemoglobin check in June of 2004 of 13).  Chemistries were  normal.  Stool for C. difficile, O&P, and C&S were all negative.   The patient had been treated empirically with Flagyl and Imodium as well as  dietary instructions. He continued to experience 4-5 watery stools/day  without gross blood.  His only abdominal pain was of cramping quality.  The  patient was generally weak and the need of admission for hydration and a  full work for intractable diarrhea.   COURSE IN THE HOSPITAL:  The patient was seen by gastroenterology in  consultation.  Flagyl was continued.  He was also treated symptomatically  with Imodium, Lactinex and Questran.  He continued to have diarrhea.  He was  very reluctant to undergo colonoscopy, but ultimately consented.  The  procedure was performed on February 16 and  was essentially benign.   The etiology of his diarrhea remains unclear; however, it is improving and  the patient is very anxious for discharge at this time.  Aside from sigmoid  colon diverticulosis/irritable bowel, question small bowel diarrhea remain  likely culprits.   DISPOSITION:  1. The patient is to continue Imodium.  2. His home meds which include terazosin 5 mg b.i.d.  3. Verapamil-SR 120 t.i.d.  4. Lisinopril 20 q.i.d.  5. HCTZ (hold house 1 week) 25 mg daily.  6. Celebrex 200 p.r.n. only.  7. Potassium 20 mEq 1 b.i.d.  8. Aspirin 81 daily.  9. Effexor XR 75 daily.  10.      He will be followed and treated expectantly as an outpatient.  Of     note, the patient's potassium today is 2.9.  He is to increase his     potassium to  t.i.d. x1 week.     ___________________________________________                                         Patrica Duel, M.D.   MC/MEDQ  D:  07/28/2003  T:  07/28/2003  Job:  [id number]

## 2011-02-05 ENCOUNTER — Other Ambulatory Visit (HOSPITAL_COMMUNITY): Payer: Self-pay | Admitting: Physical Medicine and Rehabilitation

## 2011-02-05 DIAGNOSIS — M549 Dorsalgia, unspecified: Secondary | ICD-10-CM

## 2011-02-08 ENCOUNTER — Ambulatory Visit (HOSPITAL_COMMUNITY)
Admission: RE | Admit: 2011-02-08 | Discharge: 2011-02-08 | Disposition: A | Discharge status: Home / Self Care | Payer: Medicare Other | Source: Ambulatory Visit | Attending: Physical Medicine and Rehabilitation | Admitting: Physical Medicine and Rehabilitation

## 2011-02-08 DIAGNOSIS — M546 Pain in thoracic spine: Secondary | ICD-10-CM | POA: Insufficient documentation

## 2011-02-08 DIAGNOSIS — M549 Dorsalgia, unspecified: Secondary | ICD-10-CM

## 2011-02-08 DIAGNOSIS — M5124 Other intervertebral disc displacement, thoracic region: Secondary | ICD-10-CM | POA: Insufficient documentation

## 2011-02-08 LAB — CREATININE, SERUM
Creatinine, Ser: 0.94 mg/dL (ref 0.50–1.35)
GFR calc Af Amer: >60 mL/min (ref >60)
GFR calc non Af Amer: >60 mL/min (ref >60)

## 2011-02-08 MED ORDER — GADOBENATE DIMEGLUMINE 529 MG/ML IV SOLN
17.0000 mL | Freq: Once | INTRAVENOUS | Status: AC | PRN
  Administered 2011-02-08: 17 mL via INTRAVENOUS

## 2011-06-25 DIAGNOSIS — C61 Malignant neoplasm of prostate: Secondary | ICD-10-CM | POA: Diagnosis not present

## 2011-07-10 DIAGNOSIS — Z79899 Other long term (current) drug therapy: Secondary | ICD-10-CM | POA: Diagnosis not present

## 2011-07-10 DIAGNOSIS — Z6827 Body mass index (BMI) 27.0-27.9, adult: Secondary | ICD-10-CM | POA: Diagnosis not present

## 2011-07-10 DIAGNOSIS — M159 Polyosteoarthritis, unspecified: Secondary | ICD-10-CM | POA: Diagnosis not present

## 2011-07-10 DIAGNOSIS — I10 Essential (primary) hypertension: Secondary | ICD-10-CM | POA: Diagnosis not present

## 2011-07-10 DIAGNOSIS — E785 Hyperlipidemia, unspecified: Secondary | ICD-10-CM | POA: Diagnosis not present

## 2011-07-15 DIAGNOSIS — R269 Unspecified abnormalities of gait and mobility: Secondary | ICD-10-CM | POA: Diagnosis not present

## 2011-07-15 DIAGNOSIS — G609 Hereditary and idiopathic neuropathy, unspecified: Secondary | ICD-10-CM | POA: Diagnosis not present

## 2011-07-15 DIAGNOSIS — IMO0002 Reserved for concepts with insufficient information to code with codable children: Secondary | ICD-10-CM | POA: Diagnosis not present

## 2011-07-25 DIAGNOSIS — E782 Mixed hyperlipidemia: Secondary | ICD-10-CM | POA: Diagnosis not present

## 2011-07-25 DIAGNOSIS — I10 Essential (primary) hypertension: Secondary | ICD-10-CM | POA: Diagnosis not present

## 2011-07-25 DIAGNOSIS — I359 Nonrheumatic aortic valve disorder, unspecified: Secondary | ICD-10-CM | POA: Diagnosis not present

## 2011-07-29 ENCOUNTER — Other Ambulatory Visit (HOSPITAL_COMMUNITY): Payer: Self-pay | Admitting: Orthopaedic Surgery

## 2011-07-29 DIAGNOSIS — S2239XA Fracture of one rib, unspecified side, initial encounter for closed fracture: Secondary | ICD-10-CM

## 2011-07-29 DIAGNOSIS — S2249XA Multiple fractures of ribs, unspecified side, initial encounter for closed fracture: Secondary | ICD-10-CM

## 2011-07-29 DIAGNOSIS — M25579 Pain in unspecified ankle and joints of unspecified foot: Secondary | ICD-10-CM | POA: Diagnosis not present

## 2011-07-29 DIAGNOSIS — R079 Chest pain, unspecified: Secondary | ICD-10-CM | POA: Diagnosis not present

## 2011-07-30 ENCOUNTER — Encounter (HOSPITAL_COMMUNITY): Payer: Self-pay

## 2011-07-30 ENCOUNTER — Encounter (HOSPITAL_COMMUNITY)
Admission: RE | Admit: 2011-07-30 | Discharge: 2011-07-30 | Disposition: A | Discharge status: Home / Self Care | Payer: Medicare Other | Source: Ambulatory Visit | Attending: Orthopaedic Surgery | Admitting: Orthopaedic Surgery

## 2011-07-30 DIAGNOSIS — N323 Diverticulum of bladder: Secondary | ICD-10-CM | POA: Diagnosis not present

## 2011-07-30 DIAGNOSIS — IMO0002 Reserved for concepts with insufficient information to code with codable children: Secondary | ICD-10-CM | POA: Diagnosis not present

## 2011-07-30 DIAGNOSIS — R937 Abnormal findings on diagnostic imaging of other parts of musculoskeletal system: Secondary | ICD-10-CM | POA: Diagnosis not present

## 2011-07-30 DIAGNOSIS — R079 Chest pain, unspecified: Secondary | ICD-10-CM | POA: Diagnosis not present

## 2011-07-30 DIAGNOSIS — W19XXXA Unspecified fall, initial encounter: Secondary | ICD-10-CM | POA: Insufficient documentation

## 2011-07-30 DIAGNOSIS — Z043 Encounter for examination and observation following other accident: Secondary | ICD-10-CM | POA: Diagnosis not present

## 2011-07-30 DIAGNOSIS — S2239XA Fracture of one rib, unspecified side, initial encounter for closed fracture: Secondary | ICD-10-CM

## 2011-07-30 HISTORY — DX: Essential (primary) hypertension: I10

## 2011-07-30 HISTORY — DX: Malignant (primary) neoplasm, unspecified: C80.1

## 2011-07-30 MED ORDER — TECHNETIUM TC 99M MEDRONATE IV KIT
25.0000 | PACK | Freq: Once | INTRAVENOUS | Status: AC | PRN
  Administered 2011-07-30: 24.2 via INTRAVENOUS

## 2011-07-31 DIAGNOSIS — S2239XA Fracture of one rib, unspecified side, initial encounter for closed fracture: Secondary | ICD-10-CM | POA: Diagnosis not present

## 2011-08-07 DIAGNOSIS — S2239XA Fracture of one rib, unspecified side, initial encounter for closed fracture: Secondary | ICD-10-CM | POA: Diagnosis not present

## 2011-08-07 DIAGNOSIS — M25579 Pain in unspecified ankle and joints of unspecified foot: Secondary | ICD-10-CM | POA: Diagnosis not present

## 2011-08-09 DIAGNOSIS — E782 Mixed hyperlipidemia: Secondary | ICD-10-CM | POA: Diagnosis not present

## 2011-09-04 DIAGNOSIS — M25579 Pain in unspecified ankle and joints of unspecified foot: Secondary | ICD-10-CM | POA: Diagnosis not present

## 2011-09-04 DIAGNOSIS — S2239XA Fracture of one rib, unspecified side, initial encounter for closed fracture: Secondary | ICD-10-CM | POA: Diagnosis not present

## 2011-09-26 DIAGNOSIS — J1289 Other viral pneumonia: Secondary | ICD-10-CM | POA: Diagnosis not present

## 2011-09-26 DIAGNOSIS — J069 Acute upper respiratory infection, unspecified: Secondary | ICD-10-CM | POA: Diagnosis not present

## 2011-10-10 DIAGNOSIS — R079 Chest pain, unspecified: Secondary | ICD-10-CM | POA: Diagnosis not present

## 2011-10-16 DIAGNOSIS — H52229 Regular astigmatism, unspecified eye: Secondary | ICD-10-CM | POA: Diagnosis not present

## 2011-10-16 DIAGNOSIS — H2510 Age-related nuclear cataract, unspecified eye: Secondary | ICD-10-CM | POA: Diagnosis not present

## 2011-10-16 DIAGNOSIS — H2589 Other age-related cataract: Secondary | ICD-10-CM | POA: Diagnosis not present

## 2011-10-16 DIAGNOSIS — H5200 Hypermetropia, unspecified eye: Secondary | ICD-10-CM | POA: Diagnosis not present

## 2011-12-12 ENCOUNTER — Encounter (HOSPITAL_COMMUNITY): Payer: Self-pay | Admitting: *Deleted

## 2011-12-12 ENCOUNTER — Emergency Department (HOSPITAL_COMMUNITY): Payer: Medicare Other

## 2011-12-12 ENCOUNTER — Emergency Department (HOSPITAL_COMMUNITY)
Admission: EM | Admit: 2011-12-12 | Discharge: 2011-12-12 | Disposition: A | Discharge status: Home / Self Care | Payer: Medicare Other | Attending: Emergency Medicine | Admitting: Emergency Medicine

## 2011-12-12 DIAGNOSIS — Z7982 Long term (current) use of aspirin: Secondary | ICD-10-CM | POA: Insufficient documentation

## 2011-12-12 DIAGNOSIS — M25539 Pain in unspecified wrist: Secondary | ICD-10-CM | POA: Insufficient documentation

## 2011-12-12 DIAGNOSIS — Z79899 Other long term (current) drug therapy: Secondary | ICD-10-CM | POA: Diagnosis not present

## 2011-12-12 DIAGNOSIS — S52609A Unspecified fracture of lower end of unspecified ulna, initial encounter for closed fracture: Secondary | ICD-10-CM | POA: Insufficient documentation

## 2011-12-12 DIAGNOSIS — I10 Essential (primary) hypertension: Secondary | ICD-10-CM | POA: Diagnosis not present

## 2011-12-12 DIAGNOSIS — R110 Nausea: Secondary | ICD-10-CM | POA: Insufficient documentation

## 2011-12-12 DIAGNOSIS — S52509A Unspecified fracture of the lower end of unspecified radius, initial encounter for closed fracture: Secondary | ICD-10-CM | POA: Diagnosis not present

## 2011-12-12 DIAGNOSIS — W010XXA Fall on same level from slipping, tripping and stumbling without subsequent striking against object, initial encounter: Secondary | ICD-10-CM | POA: Insufficient documentation

## 2011-12-12 DIAGNOSIS — Z043 Encounter for examination and observation following other accident: Secondary | ICD-10-CM | POA: Diagnosis not present

## 2011-12-12 MED ORDER — HYDROCODONE-ACETAMINOPHEN 5-325 MG PO TABS
1.0000 | ORAL_TABLET | Freq: Once | ORAL | Status: AC
  Administered 2011-12-12: 1 via ORAL
  Filled 2011-12-12: qty 1

## 2011-12-12 MED ORDER — OXYCODONE-ACETAMINOPHEN 5-325 MG PO TABS: 1.0000 | ORAL_TABLET | Freq: Four times a day (QID) | ORAL | Status: DC | PRN

## 2011-12-12 MED ORDER — HYDROCODONE-ACETAMINOPHEN 5-325 MG PO TABS: 1.0000 | ORAL_TABLET | Freq: Four times a day (QID) | ORAL | Status: DC | PRN

## 2011-12-12 NOTE — ED Notes (Signed)
Pt states he slipped in wet grass. Deformity to right wrist. NAD.

## 2011-12-12 NOTE — ED Provider Notes (Signed)
History  Scribed for Shelda Jakes, MD, the patient was seen in room APA18/APA18. This chart was scribed by Candelaria Stagers. The patient's care started at 6:59 PM   CSN: [id number]  Arrival date & time 12/12/11  1819   First MD Initiated Contact with Patient 12/12/11 1821      Chief Complaint  Patient presents with  . Wrist Pain     The history is provided by the patient.   Jeffrey Sherman is a 78 y.o. male who presents to the Emergency Department complaining of right wrist pain after slipping and falling earlier today outside on wet grass landing on his right wrist.  He denies hitting his head or LOC.  No pain to the legs, hips, or neck.  Nothing seems to make the pain better or worse.    Past Medical History  Diagnosis Date  . Cancer   . Hypertension     History reviewed. No pertinent past surgical history.  No family history on file.  History  Substance Use Topics  . Smoking status: Never Smoker   . Smokeless tobacco: Not on file  . Alcohol Use: No      Review of Systems  Constitutional: Negative for fever.  HENT: Negative for neck pain.   Respiratory: Negative for cough and shortness of breath.   Cardiovascular: Negative for chest pain.  Gastrointestinal: Positive for nausea. Negative for vomiting, abdominal pain and diarrhea.  Musculoskeletal: Negative for myalgias and back pain.  Skin: Negative for rash.  Neurological: Negative for headaches.    Allergies  Review of patient's allergies indicates no known allergies.  Home Medications   Current Outpatient Rx  Name Route Sig Dispense Refill  . ALPRAZOLAM 0.25 MG PO TABS Oral Take 0.25 mg by mouth at bedtime as needed. For sleep    . ASPIRIN EC 81 MG PO TBEC Oral Take 81 mg by mouth every morning.    Marland Kitchen VITAMIN C 500 MG PO TABS Oral Take 500 mg by mouth every morning.    Marland Kitchen HYDROCODONE-ACETAMINOPHEN 5-325 MG PO TABS Oral Take 1-2 tablets by mouth every 6 (six) hours as needed for pain. 10 tablet 0  .  OXYCODONE-ACETAMINOPHEN 5-325 MG PO TABS Oral Take 1 tablet by mouth every 6 (six) hours as needed for pain. 3 tablet 0    BP 140/64  Pulse 62  Temp 97.5 F (36.4 C) (Oral)  Resp 20  Ht 5\' 9"  (1.753 m)  Wt 183 lb (83.008 kg)  BMI 27.02 kg/m2  SpO2 100%  Physical Exam  Nursing note and vitals reviewed. Constitutional: He is oriented to person, place, and time. He appears well-developed and well-nourished.  HENT:  Head: Normocephalic and atraumatic.  Eyes: EOM are normal.  Neck: Normal range of motion.  Cardiovascular: Normal rate and regular rhythm.   No murmur heard. Pulmonary/Chest: Effort normal. He has no wheezes. He has no rales.  Abdominal: Bowel sounds are normal. There is no tenderness.  Musculoskeletal: Normal range of motion. He exhibits no edema.       Deformity of the right wrist.  Normal feeling and ROM of the right fingers.  Tear under the right thumb nail.  Normal sensation to light touch of the right hand.    Neurological: He is alert and oriented to person, place, and time.  Skin: Skin is warm and dry.    ED Course  Procedures   DIAGNOSTIC STUDIES: Oxygen Saturation is 100% on room air, normal by my interpretation.  COORDINATION OF CARE:     Labs Reviewed - No data to display Dg Wrist Complete Right  12/12/2011  *RADIOLOGY REPORT*  Clinical Data: Wrist pain post fall  RIGHT WRIST - COMPLETE 3+ VIEW  Comparison: None  Findings: Four views of the right wrist submitted.  There is displaced fracture of the distal right radius.  Displaced fracture of the ulnar styloid.  IMPRESSION: Displaced fracture of distal right radius and displaced fracture of the ulnar styloid.  Original Report Authenticated By: Natasha Mead, M.D.     1. Closed fracture of distal radius and ulna       MDM  X-rays consistent with a distal radius fracture with displacement dorsally and ulnar styloid process fracture. Patient placed in sugar tong splint by the nurse with my assistance  will followup with Dr. Romeo Apple orthopedics for final fracture treatment.    I personally performed the services described in this documentation, which was scribed in my presence. The recorded information has been reviewed and considered.           Shelda Jakes, MD 12/12/11 2012

## 2011-12-12 NOTE — ED Notes (Signed)
Pt fell in water, landing on right wrist, pt c/o pain to right wrist area, good cap refill noted, pt able to wiggle fingers and feel touch, obvious deformity noted. Pt denies any other injury

## 2011-12-16 ENCOUNTER — Encounter: Payer: Self-pay | Admitting: Orthopedic Surgery

## 2011-12-16 ENCOUNTER — Telehealth: Payer: Self-pay | Admitting: Orthopedic Surgery

## 2011-12-16 ENCOUNTER — Ambulatory Visit (INDEPENDENT_AMBULATORY_CARE_PROVIDER_SITE_OTHER): Payer: Medicare Other | Admitting: Orthopedic Surgery

## 2011-12-16 VITALS — BP 90/50 | Ht 69.0 in | Wt 183.0 lb

## 2011-12-16 DIAGNOSIS — S62109A Fracture of unspecified carpal bone, unspecified wrist, initial encounter for closed fracture: Secondary | ICD-10-CM | POA: Diagnosis not present

## 2011-12-16 DIAGNOSIS — K5900 Constipation, unspecified: Secondary | ICD-10-CM

## 2011-12-16 HISTORY — DX: Fracture of unspecified carpal bone, unspecified wrist, initial encounter for closed fracture: S62.109A

## 2011-12-16 MED ORDER — POLYETHYLENE GLYCOL 3350 17 GM/SCOOP PO POWD: 17.0000 g | Freq: Every day | ORAL | Status: AC

## 2011-12-16 MED ORDER — HYDROCODONE-ACETAMINOPHEN 10-325 MG PO TABS: 1.0000 | ORAL_TABLET | ORAL | Status: DC | PRN

## 2011-12-16 NOTE — Progress Notes (Signed)
Patient ID: Jeffrey Sherman, male   DOB: February 17, 1930, 78 y.o.   MRN: [id number] Chief Complaint  Patient presents with  . Wrist Injury    fractured right wrist, DOI 12/12/11    BP 90/50  Ht 5\' 9"  (1.753 m)  Wt 83.008 kg (183 lb)  BMI 27.02 kg/m2  Past Medical History  Diagnosis Date  . Cancer   . Hypertension     No past surgical history on file.  Family History  Problem Relation Age of Onset  . Cancer     History   Social History  . Marital Status: Widowed    Spouse Name: N/A    Number of Children: N/A  . Years of Education: N/A   Occupational History  . Not on file.   Social History Main Topics  . Smoking status: Never Smoker   . Smokeless tobacco: Not on file  . Alcohol Use: No  . Drug Use: No  . Sexually Active:    Other Topics Concern  . Not on file   Social History Narrative  . No narrative on file     This is a 78 year old male, who fell on July 4. He injured his RIGHT wrist. He has a displaced distal radius fracture. He was placed in splints and to the office for followup visit.  He complains of 5/10 pain and swelling with throbbing in the RIGHT wrist radiating to the RIGHT elbow.  He has a history of blurred vision, fainting. He sees a cardiologist at [facility]. Complains of some wheezing. He has some constipation secondary to the medications. He has some anxiety and depression and nervousness. He bruises easily. He has no other review of system complaints.  His family physician is Dr. Phillips Odor.  He's awake alert, and oriented. His mood and affect are normal. His overall appearance is normally ambulatory. The cane.  Left Upper extremity exam  Inspection and palpation revealed no abnormalities in the upper extremities.  Range of motion is full without contracture.  Motor exam is normal with grade 5 strength.  The joints are fully reduced without subluxation.  There is no atrophy or tremor and muscle tone is normal.  All joints are  stable.  Lower extremity exam  Ambulation is normal.  Inspection and palpation revealed no tenderness or abnormality in alignment in the lower extremities. Range of motion is full.  Strength is grade 5.   all joints are stable.  RIGHT wrist is deformed tenderness at the distal radius, significant swelling in the hand and the wrist. Painful range of motion. Joint otherwise stable. Strength not assessed. Skin intact.  X-rays show a displaced distal radius fracture with osteopenia.  Recommend open reduction, internal fixation after cardiology consult  MiraLax for constipation Norco for pain

## 2011-12-16 NOTE — Patient Instructions (Addendum)
Keep elevated   See the heart doctor and call us when they have seen you

## 2011-12-16 NOTE — Telephone Encounter (Signed)
Contacted [facility] at ph# [phone], to request "urgent" appointment for medical/surgical clearance per Dr. Romeo Apple, for wrist fracture requiring surgery as soon as possible.  Spoke with Cordelia Pen.  States patient sees Dr. Rennis Golden, who will be in office tomorrow, 12/17/11.  She will see that he receives our faxed notes, sent to fax# [phone].  Referral entered into [facility] system.

## 2011-12-17 ENCOUNTER — Telehealth: Payer: Self-pay | Admitting: Orthopedic Surgery

## 2011-12-17 ENCOUNTER — Other Ambulatory Visit: Payer: Self-pay | Admitting: *Deleted

## 2011-12-17 ENCOUNTER — Encounter (HOSPITAL_COMMUNITY): Payer: Self-pay | Admitting: Pharmacy Technician

## 2011-12-17 DIAGNOSIS — Z9889 Other specified postprocedural states: Secondary | ICD-10-CM

## 2011-12-17 NOTE — Addendum Note (Signed)
Addended by: Fuller Canada E on: 12/17/2011 02:19 PM   Modules accepted: Orders

## 2011-12-17 NOTE — Telephone Encounter (Signed)
Patient has been scheduled for surgery

## 2011-12-17 NOTE — Telephone Encounter (Signed)
Per Medicare and secondary insurer [employer] guidelines, no pre-authorization required for surgery;  Out-patient surgery scheduled at [facility] 12/19/11, repair of fracture right wrist (right closed distal radius fracture).

## 2011-12-17 NOTE — Telephone Encounter (Signed)
Letter of clearance received today, 12/17/11, from Dr. Rennis Golden.  Dr. Romeo Apple has reviewed, and has scheduled the surgery for this Thursday, 12/19/11; patient aware, per call to patient and family by nurse.  Pre-op and post op appointments scheduled, as noted in system.

## 2011-12-18 ENCOUNTER — Encounter (HOSPITAL_COMMUNITY)
Admission: RE | Admit: 2011-12-18 | Discharge: 2011-12-18 | Disposition: A | Discharge status: Home / Self Care | Payer: Medicare Other | Source: Ambulatory Visit | Attending: Orthopedic Surgery | Admitting: Orthopedic Surgery

## 2011-12-18 ENCOUNTER — Encounter (HOSPITAL_COMMUNITY): Payer: Self-pay | Admitting: Pharmacy Technician

## 2011-12-18 ENCOUNTER — Other Ambulatory Visit: Payer: Self-pay | Admitting: *Deleted

## 2011-12-18 ENCOUNTER — Encounter (HOSPITAL_COMMUNITY): Payer: Self-pay

## 2011-12-18 DIAGNOSIS — Z01810 Encounter for preprocedural cardiovascular examination: Secondary | ICD-10-CM | POA: Diagnosis not present

## 2011-12-18 DIAGNOSIS — Z9889 Other specified postprocedural states: Secondary | ICD-10-CM

## 2011-12-18 DIAGNOSIS — Z01812 Encounter for preprocedural laboratory examination: Secondary | ICD-10-CM | POA: Diagnosis not present

## 2011-12-18 DIAGNOSIS — S52539A Colles' fracture of unspecified radius, initial encounter for closed fracture: Secondary | ICD-10-CM | POA: Diagnosis not present

## 2011-12-18 DIAGNOSIS — I10 Essential (primary) hypertension: Secondary | ICD-10-CM | POA: Diagnosis not present

## 2011-12-18 DIAGNOSIS — Z79899 Other long term (current) drug therapy: Secondary | ICD-10-CM | POA: Diagnosis not present

## 2011-12-18 HISTORY — DX: Cardiac murmur, unspecified: R01.1

## 2011-12-18 HISTORY — DX: Shortness of breath: R06.02

## 2011-12-18 HISTORY — DX: Gastro-esophageal reflux disease without esophagitis: K21.9

## 2011-12-18 HISTORY — DX: Unspecified hearing loss, unspecified ear: H91.90

## 2011-12-18 HISTORY — DX: Anxiety disorder, unspecified: F41.9

## 2011-12-18 HISTORY — DX: Other specified health status: Z78.9

## 2011-12-18 LAB — APTT: aPTT: 31 seconds (ref 24–37)

## 2011-12-18 LAB — CBC
HCT: 36.6 % — ABNORMAL LOW (ref 39.0–52.0)
Hemoglobin: 12.2 g/dL — ABNORMAL LOW (ref 13.0–17.0)
MCV: 84.5 fL (ref 78.0–100.0)
RBC: 4.33 MIL/uL (ref 4.22–5.81)
RDW: 14 % (ref 11.5–15.5)
WBC: 6.9 10*3/uL (ref 4.0–10.5)

## 2011-12-18 LAB — DIFFERENTIAL
Eosinophils Relative: 3 % (ref 0–5)
Lymphocytes Relative: 15 % (ref 12–46)
Lymphs Abs: 1 10*3/uL (ref 0.7–4.0)
Monocytes Absolute: 0.7 10*3/uL (ref 0.1–1.0)
Monocytes Relative: 11 % (ref 3–12)

## 2011-12-18 LAB — BASIC METABOLIC PANEL
CO2: 29 mEq/L (ref 19–32)
Chloride: 105 mEq/L (ref 96–112)
Creatinine, Ser: 0.96 mg/dL (ref 0.50–1.35)
Potassium: 4.2 mEq/L (ref 3.5–5.1)

## 2011-12-18 NOTE — Patient Instructions (Addendum)
pat20 Jeffrey Sherman  12/18/2011   Your procedure is scheduled on:  12/19/2011  Report to [facility] at  1130  AM.  Call this number if you have problems the morning of surgery: [phone]   Remember:   Do not eat food:After Midnight.  May have clear liquids:until Midnight .    Take these medicines the morning of surgery with A SIP OF WATER: norco,percocet,xanax,hctz,omeprazole,lisinopril,terazosin,effexor,verapamil   Do not wear jewelry, make-up or nail polish.  Do not wear lotions, powders, or perfumes. You may wear deodorant.  Do not shave 48 hours prior to surgery. Men may shave face and neck.  Do not bring valuables to the hospital.  Contacts, dentures or bridgework may not be worn into surgery.  Leave suitcase in the car. After surgery it may be brought to your room.  For patients admitted to the hospital, checkout time is 11:00 AM the day of discharge.   Patients discharged the day of surgery will not be allowed to drive home.  Name and phone number of your driver: family  Special Instructions: CHG Shower Use Special Wash: 1/2 bottle night before surgery and 1/2 bottle morning of surgery.   Please read over the following fact sheets that you were given: Pain Booklet, MRSA Information, Surgical Site Infection Prevention, Anesthesia Post-op Instructions and Care and Recovery After Surgery Open Reduction, Internal Fixation (ORIF), Generic Usually, if bones are broken (fractured) and are out of place, unstable, or may become out of place, surgery is needed. This surgery is called an open reduction and internal fixation (ORIF). Open reduction means that the area of the fracture is opened up so the surgeon can see it. Internal fixation means that screws, pins, or fixation devices are used to hold the bone pieces in place. LET YOUR CAREGIVER KNOW ABOUT:   Allergies.   Medicines taken, including herbs, eyedrops, over-the-counter medicines, and creams.   Use of steroids (by mouth  or creams).   Previous problems with anesthetics or numbing medicines.   History of bleeding or blood problems.   History of blood clots.   Possibility of pregnancy, if this applies.   Previous surgery.   Other health problems.  RISKS AND COMPLICATIONS  All surgery is associated with risks. Some of these risks are:  Excessive bleeding.   Infection.   Imperfect results with loss of joint function.  BEFORE THE PROCEDURE  Usually, surgery is performed shortly after the injury. It is important to provide information to your caregiver after your injury. AFTER THE PROCEDURE  After surgery, you will be taken to a recovery area where a nurse will monitor your progress. You may have a long, narrow tube(catheter) in the bladder following surgery that helps you pass your water. When awake, stable, taking fluids well, and without complications, you will be returned to your room. You will receive physical therapy and other care. Physical therapy is done until you are doing well and your caregiver feels it is safe for you to go home or to an extended care facility. Following surgery, the bones may be protected with a cast. The type of casting depends on where the fracture was. Casts are generally left in place for about 5 to 6 weeks. During this time, your caregiver will follow your progress. X-rays may be taken during healing to make sure the bones stay in place. HOME CARE INSTRUCTIONS   You or your child may resume normal diet and activities as directed or allowed.   Put  ice on the injured area.   Put ice in a plastic bag.   Place a towel between the skin and the bag.   Leave the ice on for 15 to 20 minutes at a time, 3 to 4 times a day, for the first 2 days following surgery.   Change bandages (dressings) if necessary or as directed.   If given a plaster or fiberglass cast:   Do not try to scratch the skin under the cast using sharp or pointed objects.   Check the skin around the  cast every day. You may put lotion on any red or sore areas.   Keep the cast dry and clean.   Do not put pressure on any part of the cast or splint until it is fully hardened.   The cast or splint can be protected during bathing with a plastic bag. Do not lower the cast or splint into water.   Only take over-the-counter or prescription medicines for pain, discomfort, or fever as directed by your caregiver.   Use crutches as directed and do not exercise the leg unless instructed.   If the bones get out of position (displaced), it may eventually lead to arthritis and lasting disability. Problems can follow even the best of care. Follow the directions of your caregiver.   Follow all instructions given by your caregiver, make and keep follow-up appointments, and use crutches as directed.  SEEK IMMEDIATE MEDICAL CARE IF:   There is redness, swelling, numbness, or increasing pain in the wound.   There is pus coming from the wound.   You or your child has an oral temperature above 102 F (38.9 C), not controlled by medicine.   A bad smell is coming from the wound or dressing.   The wound breaks open (edges not staying together) after stitches (sutures) or staples have been removed.   The skin or nails below the injury turn blue or gray, or feel cold or numb.   There is severe pain under the cast or in the foot.  If there is not a window in the cast for observing the wound, a discharge or minor bleeding may show up as a stain on the outside of the cast. Report these findings to your caregiver. MAKE SURE YOU:   Understand these instructions.   Will watch your condition.   Will get help right away if you are not doing well or gets worse.  Document Released: 06/07/2006 Document Revised: 05/16/2011 Document Reviewed: 05/15/2007 [facility] Patient Information 2012 [place], [place].PATIENT INSTRUCTIONS POST-ANESTHESIA  IMMEDIATELY FOLLOWING SURGERY:  Do not drive or operate machinery for  the first twenty four hours after surgery.  Do not make any important decisions for twenty four hours after surgery or while taking narcotic pain medications or sedatives.  If you develop intractable nausea and vomiting or a severe headache please notify your doctor immediately.  FOLLOW-UP:  Please make an appointment with your surgeon as instructed. You do not need to follow up with anesthesia unless specifically instructed to do so.  WOUND CARE INSTRUCTIONS (if applicable):  Keep a dry clean dressing on the anesthesia/puncture wound site if there is drainage.  Once the wound has quit draining you may leave it open to air.  Generally you should leave the bandage intact for twenty four hours unless there is drainage.  If the epidural site drains for more than 36-48 hours please call the anesthesia department.  QUESTIONS?:  Please feel free to call your physician or the  hospital operator if you have any questions, and they will be happy to assist you.

## 2011-12-18 NOTE — Progress Notes (Signed)
12/18/11 0924  OBSTRUCTIVE SLEEP APNEA  Have you ever been diagnosed with sleep apnea through a sleep study? No  Do you snore loudly (loud enough to be heard through closed doors)?  1  Do you often feel tired, fatigued, or sleepy during the daytime? 0  Has anyone observed you stop breathing during your sleep? 0  Do you have, or are you being treated for high blood pressure? 1  BMI more than 35 kg/m2? 0  Age over 76 years old? 1  Neck circumference greater than 40 cm/18 inches? 0  Gender: 1  Obstructive Sleep Apnea Score 4   Score 4 or greater  Updated health history;Results sent to PCP

## 2011-12-18 NOTE — Pre-Procedure Instructions (Signed)
Patient's daughter in law concerned that patient may have postop N/V,sts he had PONV with Ether he had been given in past,but patient cannot remember type of surgery he had them. Daughter in law spoke with Glynn Octave CRNA about this.

## 2011-12-19 ENCOUNTER — Encounter (HOSPITAL_COMMUNITY): Payer: Self-pay | Admitting: Anesthesiology

## 2011-12-19 ENCOUNTER — Ambulatory Visit (HOSPITAL_COMMUNITY): Payer: Medicare Other

## 2011-12-19 ENCOUNTER — Encounter (HOSPITAL_COMMUNITY): Payer: Self-pay | Admitting: *Deleted

## 2011-12-19 ENCOUNTER — Ambulatory Visit (HOSPITAL_COMMUNITY): Payer: Medicare Other | Admitting: Anesthesiology

## 2011-12-19 ENCOUNTER — Ambulatory Visit (HOSPITAL_COMMUNITY)
Admission: RE | Admit: 2011-12-19 | Discharge: 2011-12-20 | Disposition: A | Discharge status: Home / Self Care | Payer: Medicare Other | Source: Ambulatory Visit | Attending: Orthopedic Surgery | Admitting: Orthopedic Surgery

## 2011-12-19 ENCOUNTER — Encounter (HOSPITAL_COMMUNITY)
Admission: RE | Disposition: A | Discharge status: Home / Self Care | Payer: Self-pay | Source: Ambulatory Visit | Attending: Orthopedic Surgery

## 2011-12-19 DIAGNOSIS — K5900 Constipation, unspecified: Secondary | ICD-10-CM

## 2011-12-19 DIAGNOSIS — S62109A Fracture of unspecified carpal bone, unspecified wrist, initial encounter for closed fracture: Secondary | ICD-10-CM | POA: Diagnosis not present

## 2011-12-19 DIAGNOSIS — Z79899 Other long term (current) drug therapy: Secondary | ICD-10-CM | POA: Insufficient documentation

## 2011-12-19 DIAGNOSIS — S52539A Colles' fracture of unspecified radius, initial encounter for closed fracture: Secondary | ICD-10-CM | POA: Insufficient documentation

## 2011-12-19 DIAGNOSIS — Z01810 Encounter for preprocedural cardiovascular examination: Secondary | ICD-10-CM | POA: Insufficient documentation

## 2011-12-19 DIAGNOSIS — S52599A Other fractures of lower end of unspecified radius, initial encounter for closed fracture: Secondary | ICD-10-CM | POA: Diagnosis not present

## 2011-12-19 DIAGNOSIS — Z01812 Encounter for preprocedural laboratory examination: Secondary | ICD-10-CM | POA: Insufficient documentation

## 2011-12-19 DIAGNOSIS — IMO0002 Reserved for concepts with insufficient information to code with codable children: Secondary | ICD-10-CM | POA: Diagnosis not present

## 2011-12-19 DIAGNOSIS — I10 Essential (primary) hypertension: Secondary | ICD-10-CM | POA: Insufficient documentation

## 2011-12-19 DIAGNOSIS — W19XXXA Unspecified fall, initial encounter: Secondary | ICD-10-CM | POA: Insufficient documentation

## 2011-12-19 HISTORY — PX: ORIF WRIST FRACTURE: SHX2133

## 2011-12-19 SURGERY — OPEN REDUCTION INTERNAL FIXATION (ORIF) WRIST FRACTURE
Anesthesia: General | Site: Wrist | Laterality: Right | Wound class: Clean

## 2011-12-19 MED ORDER — LABETALOL HCL 5 MG/ML IV SOLN
INTRAVENOUS | Status: AC
  Filled 2011-12-19: qty 4

## 2011-12-19 MED ORDER — MORPHINE SULFATE 4 MG/ML IJ SOLN
2.0000 mg | INTRAMUSCULAR | Status: DC | PRN
  Administered 2011-12-20 (×2): 2 mg via INTRAVENOUS
  Filled 2011-12-19 (×2): qty 1

## 2011-12-19 MED ORDER — ONDANSETRON HCL 4 MG/2ML IJ SOLN
INTRAMUSCULAR | Status: AC
  Filled 2011-12-19: qty 2

## 2011-12-19 MED ORDER — MIDAZOLAM HCL 2 MG/2ML IJ SOLN
INTRAMUSCULAR | Status: AC
  Administered 2011-12-19: 2 mg via INTRAVENOUS
  Filled 2011-12-19: qty 2

## 2011-12-19 MED ORDER — LABETALOL HCL 5 MG/ML IV SOLN
10.0000 mg | Freq: Once | INTRAVENOUS | Status: AC
  Administered 2011-12-19: 10 mg via INTRAVENOUS

## 2011-12-19 MED ORDER — ETOMIDATE 2 MG/ML IV SOLN
INTRAVENOUS | Status: DC | PRN
  Administered 2011-12-19: 6 mg via INTRAVENOUS
  Administered 2011-12-19: 14 mg via INTRAVENOUS

## 2011-12-19 MED ORDER — HYDRALAZINE HCL 20 MG/ML IJ SOLN
INTRAMUSCULAR | Status: AC
  Filled 2011-12-19: qty 1

## 2011-12-19 MED ORDER — MIDAZOLAM HCL 2 MG/2ML IJ SOLN
INTRAMUSCULAR | Status: AC
  Filled 2011-12-19: qty 2

## 2011-12-19 MED ORDER — PROPOFOL 10 MG/ML IV EMUL
INTRAVENOUS | Status: AC
  Filled 2011-12-19: qty 20

## 2011-12-19 MED ORDER — LISINOPRIL 5 MG PO TABS
2.5000 mg | ORAL_TABLET | Freq: Every day | ORAL | Status: DC
  Administered 2011-12-20: 2.5 mg via ORAL
  Filled 2011-12-19: qty 1

## 2011-12-19 MED ORDER — ACETAMINOPHEN 10 MG/ML IV SOLN
INTRAVENOUS | Status: AC
  Filled 2011-12-19: qty 100

## 2011-12-19 MED ORDER — CEFAZOLIN SODIUM-DEXTROSE 2-3 GM-% IV SOLR
INTRAVENOUS | Status: AC
  Filled 2011-12-19: qty 50

## 2011-12-19 MED ORDER — FENTANYL CITRATE 0.05 MG/ML IJ SOLN
INTRAMUSCULAR | Status: AC
  Filled 2011-12-19: qty 2

## 2011-12-19 MED ORDER — ASPIRIN EC 81 MG PO TBEC
81.0000 mg | DELAYED_RELEASE_TABLET | ORAL | Status: DC
  Administered 2011-12-20: 81 mg via ORAL
  Filled 2011-12-19: qty 1

## 2011-12-19 MED ORDER — TERAZOSIN HCL 1 MG PO CAPS
1.0000 mg | ORAL_CAPSULE | Freq: Every day | ORAL | Status: DC
  Administered 2011-12-19: 1 mg via ORAL
  Filled 2011-12-19: qty 1

## 2011-12-19 MED ORDER — ONDANSETRON HCL 4 MG PO TABS: 4.0000 mg | ORAL_TABLET | Freq: Four times a day (QID) | ORAL | Status: DC | PRN

## 2011-12-19 MED ORDER — FENTANYL CITRATE 0.05 MG/ML IJ SOLN
25.0000 ug | INTRAMUSCULAR | Status: DC | PRN
  Administered 2011-12-19 (×2): 50 ug via INTRAVENOUS

## 2011-12-19 MED ORDER — CHLORHEXIDINE GLUCONATE 4 % EX LIQD
60.0000 mL | Freq: Once | CUTANEOUS | Status: DC
  Filled 2011-12-19: qty 60

## 2011-12-19 MED ORDER — SODIUM CHLORIDE 0.9 % IR SOLN
Status: DC | PRN
  Administered 2011-12-19: 1000 mL

## 2011-12-19 MED ORDER — POLYETHYLENE GLYCOL 3350 17 G PO PACK: 17.0000 g | PACK | Freq: Every day | ORAL | Status: DC

## 2011-12-19 MED ORDER — LIDOCAINE HCL (PF) 1 % IJ SOLN
INTRAMUSCULAR | Status: AC
  Filled 2011-12-19: qty 5

## 2011-12-19 MED ORDER — ALPRAZOLAM 0.25 MG PO TABS
0.2500 mg | ORAL_TABLET | Freq: Every evening | ORAL | Status: DC | PRN
  Administered 2011-12-19: 0.25 mg via ORAL
  Filled 2011-12-19: qty 1

## 2011-12-19 MED ORDER — ALUM & MAG HYDROXIDE-SIMETH 200-200-20 MG/5ML PO SUSP: 30.0000 mL | Freq: Four times a day (QID) | ORAL | Status: DC | PRN

## 2011-12-19 MED ORDER — OMEGA-3-ACID ETHYL ESTERS 1 G PO CAPS
1.0000 g | ORAL_CAPSULE | Freq: Every day | ORAL | Status: DC
  Administered 2011-12-19 – 2011-12-20 (×2): 1 g via ORAL
  Filled 2011-12-19 (×2): qty 1

## 2011-12-19 MED ORDER — LIDOCAINE HCL (CARDIAC) 10 MG/ML IV SOLN
INTRAVENOUS | Status: DC | PRN
  Administered 2011-12-19: 50 mg via INTRAVENOUS

## 2011-12-19 MED ORDER — ONDANSETRON HCL 4 MG/2ML IJ SOLN: 4.0000 mg | Freq: Four times a day (QID) | INTRAMUSCULAR | Status: DC | PRN

## 2011-12-19 MED ORDER — POTASSIUM CHLORIDE CRYS ER 20 MEQ PO TBCR
20.0000 meq | EXTENDED_RELEASE_TABLET | Freq: Two times a day (BID) | ORAL | Status: DC
  Administered 2011-12-19 – 2011-12-20 (×2): 20 meq via ORAL
  Filled 2011-12-19 (×2): qty 1

## 2011-12-19 MED ORDER — TRAMADOL HCL 50 MG PO TABS
ORAL_TABLET | ORAL | Status: AC
  Filled 2011-12-19: qty 1

## 2011-12-19 MED ORDER — CEFAZOLIN SODIUM-DEXTROSE 2-3 GM-% IV SOLR: 2.0000 g | INTRAVENOUS | Status: DC

## 2011-12-19 MED ORDER — VENLAFAXINE HCL ER 75 MG PO CP24
150.0000 mg | ORAL_CAPSULE | Freq: Every day | ORAL | Status: DC
  Administered 2011-12-19 – 2011-12-20 (×2): 150 mg via ORAL
  Filled 2011-12-19 (×2): qty 2

## 2011-12-19 MED ORDER — BUPIVACAINE HCL (PF) 0.5 % IJ SOLN
INTRAMUSCULAR | Status: DC | PRN
  Administered 2011-12-19: 30 mL

## 2011-12-19 MED ORDER — PANTOPRAZOLE SODIUM 40 MG PO TBEC
40.0000 mg | DELAYED_RELEASE_TABLET | Freq: Every day | ORAL | Status: DC
  Administered 2011-12-19: 40 mg via ORAL
  Filled 2011-12-19: qty 1

## 2011-12-19 MED ORDER — HYDROCODONE-ACETAMINOPHEN 10-325 MG PO TABS
1.0000 | ORAL_TABLET | ORAL | Status: DC | PRN
  Administered 2011-12-19 – 2011-12-20 (×3): 1 via ORAL
  Filled 2011-12-19 (×3): qty 1

## 2011-12-19 MED ORDER — FENTANYL CITRATE 0.05 MG/ML IJ SOLN
INTRAMUSCULAR | Status: AC
  Administered 2011-12-19: 50 ug via INTRAVENOUS
  Filled 2011-12-19: qty 2

## 2011-12-19 MED ORDER — LACTATED RINGERS IV SOLN
INTRAVENOUS | Status: DC
  Administered 2011-12-19: 12:00:00 via INTRAVENOUS

## 2011-12-19 MED ORDER — HYDROCHLOROTHIAZIDE 25 MG PO TABS
25.0000 mg | ORAL_TABLET | Freq: Every day | ORAL | Status: DC
  Administered 2011-12-20: 25 mg via ORAL
  Filled 2011-12-19: qty 1

## 2011-12-19 MED ORDER — ONDANSETRON HCL 4 MG/2ML IJ SOLN: 4.0000 mg | Freq: Once | INTRAMUSCULAR | Status: DC | PRN

## 2011-12-19 MED ORDER — POLYETHYLENE GLYCOL 3350 17 G PO PACK
17.0000 g | PACK | Freq: Every day | ORAL | Status: DC
  Administered 2011-12-19 – 2011-12-20 (×2): 17 g via ORAL
  Filled 2011-12-19 (×2): qty 1

## 2011-12-19 MED ORDER — LABETALOL HCL 5 MG/ML IV SOLN
INTRAVENOUS | Status: DC | PRN
  Administered 2011-12-19: 10 mg via INTRAVENOUS

## 2011-12-19 MED ORDER — BUPIVACAINE HCL (PF) 0.5 % IJ SOLN
INTRAMUSCULAR | Status: AC
  Filled 2011-12-19: qty 60

## 2011-12-19 MED ORDER — LACTATED RINGERS IV SOLN
INTRAVENOUS | Status: DC | PRN
  Administered 2011-12-19: 12:00:00 via INTRAVENOUS

## 2011-12-19 MED ORDER — ROCURONIUM BROMIDE 50 MG/5ML IV SOLN
INTRAVENOUS | Status: AC
  Filled 2011-12-19: qty 1

## 2011-12-19 MED ORDER — CEFAZOLIN SODIUM 1-5 GM-% IV SOLN
INTRAVENOUS | Status: DC | PRN
  Administered 2011-12-19: 2 g via INTRAVENOUS

## 2011-12-19 MED ORDER — ACETAMINOPHEN 10 MG/ML IV SOLN
1000.0000 mg | Freq: Once | INTRAVENOUS | Status: AC
  Administered 2011-12-19: 1000 mg via INTRAVENOUS

## 2011-12-19 MED ORDER — HYDRALAZINE HCL 20 MG/ML IJ SOLN
INTRAMUSCULAR | Status: DC | PRN
  Administered 2011-12-19 (×2): 2 mg via INTRAVENOUS

## 2011-12-19 MED ORDER — SIMVASTATIN 20 MG PO TABS
20.0000 mg | ORAL_TABLET | Freq: Every evening | ORAL | Status: DC
  Administered 2011-12-19: 20 mg via ORAL
  Filled 2011-12-19: qty 1

## 2011-12-19 MED ORDER — TRAMADOL HCL 50 MG PO TABS
50.0000 mg | ORAL_TABLET | Freq: Once | ORAL | Status: AC
  Administered 2011-12-19: 50 mg via ORAL

## 2011-12-19 MED ORDER — ARTIFICIAL TEARS OP OINT
TOPICAL_OINTMENT | OPHTHALMIC | Status: AC
  Filled 2011-12-19: qty 3.5

## 2011-12-19 MED ORDER — ONDANSETRON HCL 4 MG/2ML IJ SOLN
4.0000 mg | Freq: Once | INTRAMUSCULAR | Status: AC
  Administered 2011-12-19: 4 mg via INTRAVENOUS

## 2011-12-19 MED ORDER — MIDAZOLAM HCL 2 MG/2ML IJ SOLN
1.0000 mg | INTRAMUSCULAR | Status: DC | PRN
  Administered 2011-12-19 (×2): 2 mg via INTRAVENOUS

## 2011-12-19 MED ORDER — BIOTENE DRY MOUTH MT LIQD
15.0000 mL | Freq: Two times a day (BID) | OROMUCOSAL | Status: DC
  Administered 2011-12-20: 15 mL via OROMUCOSAL

## 2011-12-19 MED ORDER — VITAMIN C 500 MG PO TABS
500.0000 mg | ORAL_TABLET | ORAL | Status: DC
  Administered 2011-12-20: 500 mg via ORAL
  Filled 2011-12-19: qty 1

## 2011-12-19 MED ORDER — LORAZEPAM 0.5 MG PO TABS: 0.5000 mg | ORAL_TABLET | ORAL | Status: DC | PRN

## 2011-12-19 MED ORDER — VERAPAMIL HCL 80 MG PO TABS
40.0000 mg | ORAL_TABLET | Freq: Two times a day (BID) | ORAL | Status: DC
  Administered 2011-12-19 – 2011-12-20 (×2): 40 mg via ORAL
  Filled 2011-12-19: qty 1
  Filled 2011-12-19: qty 2

## 2011-12-19 MED ORDER — FENTANYL CITRATE 0.05 MG/ML IJ SOLN
INTRAMUSCULAR | Status: DC | PRN
  Administered 2011-12-19 (×4): 50 ug via INTRAVENOUS

## 2011-12-19 MED ORDER — ETOMIDATE 2 MG/ML IV SOLN
INTRAVENOUS | Status: AC
  Filled 2011-12-19: qty 10

## 2011-12-19 MED ORDER — SODIUM CHLORIDE 0.9 % IV SOLN
INTRAVENOUS | Status: DC
  Administered 2011-12-19: 17:00:00 via INTRAVENOUS

## 2011-12-19 MED ORDER — SUCCINYLCHOLINE CHLORIDE 20 MG/ML IJ SOLN
INTRAMUSCULAR | Status: AC
  Filled 2011-12-19: qty 1

## 2011-12-19 SURGICAL SUPPLY — 64 items
BAG HAMPER (MISCELLANEOUS) ×2 IMPLANT
BANDAGE ELASTIC 3 VELCRO NS (GAUZE/BANDAGES/DRESSINGS) ×2 IMPLANT
BANDAGE ELASTIC 4 VELCRO NS (GAUZE/BANDAGES/DRESSINGS) ×2 IMPLANT
BANDAGE ESMARK 4X12 BL STRL LF (DISPOSABLE) ×1 IMPLANT
BIT DRILL 2 FAST STEP (BIT) ×2 IMPLANT
BIT DRILL 2.5X4 QC (BIT) ×2 IMPLANT
BLADE SURG SZ10 CARB STEEL (BLADE) ×2 IMPLANT
BNDG CMPR 12X4 ELC STRL LF (DISPOSABLE) ×1
BNDG COHESIVE 4X5 TAN NS LF (GAUZE/BANDAGES/DRESSINGS) ×2 IMPLANT
BNDG ESMARK 4X12 BLUE STRL LF (DISPOSABLE) ×2
CHLORAPREP W/TINT 26ML (MISCELLANEOUS) IMPLANT
CLOTH BEACON ORANGE TIMEOUT ST (SAFETY) ×2 IMPLANT
COVER LIGHT HANDLE STERIS (MISCELLANEOUS) ×4 IMPLANT
COVER PROBE W GEL 5X96 (DRAPES) IMPLANT
CUFF TOURNIQUET SINGLE 18IN (TOURNIQUET CUFF) ×2 IMPLANT
DRAPE C-ARM FOLDED MOBILE STRL (DRAPES) ×2 IMPLANT
DRSG TEGADERM 2-3/8X2-3/4 SM (GAUZE/BANDAGES/DRESSINGS) ×2 IMPLANT
DRSG XEROFORM 1X8 (GAUZE/BANDAGES/DRESSINGS) ×2 IMPLANT
ELECT REM PT RETURN 9FT ADLT (ELECTROSURGICAL) ×2
ELECTRODE REM PT RTRN 9FT ADLT (ELECTROSURGICAL) ×1 IMPLANT
GLOVE BIOGEL PI IND STRL 6.5 (GLOVE) ×1 IMPLANT
GLOVE BIOGEL PI IND STRL 7.0 (GLOVE) ×2 IMPLANT
GLOVE BIOGEL PI INDICATOR 6.5 (GLOVE) ×1
GLOVE BIOGEL PI INDICATOR 7.0 (GLOVE) ×2
GLOVE ECLIPSE 6.5 STRL STRAW (GLOVE) ×6 IMPLANT
GLOVE EXAM NITRILE MD LF STRL (GLOVE) ×2 IMPLANT
GLOVE SKINSENSE NS SZ8.0 LF (GLOVE) ×1
GLOVE SKINSENSE STRL SZ8.0 LF (GLOVE) ×1 IMPLANT
GLOVE SS N UNI LF 8.5 STRL (GLOVE) ×2 IMPLANT
GOWN STRL REIN XL XLG (GOWN DISPOSABLE) ×8 IMPLANT
INST SET MINOR BONE (KITS) ×2 IMPLANT
K-WIRE 1.6 (WIRE) ×1
K-WIRE FX5X1.6XNS BN SS (WIRE) ×1
KIT ROOM TURNOVER APOR (KITS) ×2 IMPLANT
KWIRE FX5X1.6XNS BN SS (WIRE) ×1 IMPLANT
MANIFOLD NEPTUNE II (INSTRUMENTS) ×2 IMPLANT
NEEDLE HYPO 21X1.5 SAFETY (NEEDLE) ×2 IMPLANT
NS IRRIG 1000ML POUR BTL (IV SOLUTION) ×2 IMPLANT
PACK BASIC LIMB (CUSTOM PROCEDURE TRAY) ×2 IMPLANT
PAD ARMBOARD 7.5X6 YLW CONV (MISCELLANEOUS) ×2 IMPLANT
PAD CAST 3X4 CTTN HI CHSV (CAST SUPPLIES) ×1 IMPLANT
PADDING CAST COTTON 3X4 STRL (CAST SUPPLIES) ×2
PEG SUBCHONDRAL SMOOTH 2.0X18 (Peg) ×4 IMPLANT
PEG SUBCHONDRAL SMOOTH 2.0X20 (Peg) ×4 IMPLANT
PEG SUBCHONDRAL SMOOTH 2.0X22 (Peg) ×4 IMPLANT
PEG SUBCHONDRAL SMOOTH 2.0X26 (Peg) ×2 IMPLANT
PEG SUBCHONDRAL SMOOTH 2.0X28 (Peg) ×2 IMPLANT
PLATE STAN 24.4X59.5 RT (Plate) ×2 IMPLANT
SCREW BN 13X3.5XNS CORT TI (Screw) ×1 IMPLANT
SCREW CORT 3.5X13 (Screw) ×1 IMPLANT
SCREW CORT 3.5X14 LNG (Screw) ×4 IMPLANT
SET BASIN LINEN APH (SET/KITS/TRAYS/PACK) ×2 IMPLANT
SPLINT IMMOBILIZER J 3INX20FT (CAST SUPPLIES)
SPLINT J IMMOBILIZER 3X20FT (CAST SUPPLIES) IMPLANT
SPONGE GAUZE 2X2 8PLY STRL LF (GAUZE/BANDAGES/DRESSINGS) ×2 IMPLANT
SPONGE GAUZE 4X4 12PLY (GAUZE/BANDAGES/DRESSINGS) ×2 IMPLANT
SPONGE LAP 4X18 X RAY DECT (DISPOSABLE) ×2 IMPLANT
STAPLER VISISTAT (STAPLE) ×2 IMPLANT
SUT ETHILON 3 0 FSL (SUTURE) IMPLANT
SUT MON AB 2-0 CT1 36 (SUTURE) ×2 IMPLANT
SYR 30ML LL (SYRINGE) ×2 IMPLANT
SYR BULB IRRIGATION 50ML (SYRINGE) ×2 IMPLANT
SYR CONTROL 10ML LL (SYRINGE) ×2 IMPLANT
WATER STERILE IRR 1000ML POUR (IV SOLUTION) ×4 IMPLANT

## 2011-12-19 NOTE — H&P (Signed)
  Chief Complaint   Patient presents with   .  Wrist Injury     fractured right wrist, DOI 12/12/11   BP 90/50  Ht 5\' 9"  (1.753 m)  Wt 83.008 kg (183 lb)  BMI 27.02 kg/m2  Past Medical History   Diagnosis  Date   .  Cancer    .  Hypertension    No past surgical history on file.  Family History   Problem  Relation  Age of Onset   .  Cancer      History    Social History   .  Marital Status:  Widowed     Spouse Name:  N/A     Number of Children:  N/A   .  Years of Education:  N/A    Occupational History   .  Not on file.    Social History Main Topics   .  Smoking status:  Never Smoker   .  Smokeless tobacco:  Not on file   .  Alcohol Use:  No   .  Drug Use:  No   .  Sexually Active:     Other Topics  Concern   .  Not on file    Social History Narrative   .  No narrative on file   This is a 78 year old male, who fell on July 4. He injured his RIGHT wrist. He has a displaced distal radius fracture. He was placed in splints and to the office for followup visit.  He complains of 5/10 pain and swelling with throbbing in the RIGHT wrist radiating to the RIGHT elbow.  He has a history of blurred vision, fainting. He sees a cardiologist at [facility]. Complains of some wheezing. He has some constipation secondary to the medications. He has some anxiety and depression and nervousness. He bruises easily. He has no other review of system complaints.  His family physician is Dr. Phillips Odor.  He's awake alert, and oriented. His mood and affect are normal. His overall appearance is normally ambulatory. The cane.  Left Upper extremity exam  Inspection and palpation revealed no abnormalities in the upper extremities. Range of motion is full without contracture.  Motor exam is normal with grade 5 strength.  The joints are fully reduced without subluxation.  There is no atrophy or tremor and muscle tone is normal. All joints are stable.  Lower extremity exam  Ambulation is normal.    Inspection and palpation revealed no tenderness or abnormality in alignment in the lower extremities.  Range of motion is full. Strength is grade 5.  all joints are stable.  RIGHT wrist is deformed tenderness at the distal radius, significant swelling in the hand and the wrist. Painful range of motion. Joint otherwise stable. Strength not assessed. Skin intact.  X-rays show a displaced distal radius fracture with osteopenia.  Recommend open reduction, internal fixation after cardiology consult  MiraLax for constipation  Norco for pain   Addendum: [facility] cardiology has cleared patient for surgery

## 2011-12-19 NOTE — Transfer of Care (Signed)
Immediate Anesthesia Transfer of Care Note  Patient: Jeffrey Sherman  Procedure(s) Performed: Procedure(s) (LRB): OPEN REDUCTION INTERNAL FIXATION (ORIF) WRIST FRACTURE (Right)  Patient Location: PACU  Anesthesia Type: General  Level of Consciousness: awake, alert , oriented and patient cooperative  Airway & Oxygen Therapy: Patient Spontanous Breathing and Patient connected to face mask oxygen  Post-op Assessment: Report given to PACU RN and Post -op Vital signs reviewed and stable  Post vital signs: Reviewed and stable  Complications: No apparent anesthesia complications

## 2011-12-19 NOTE — Brief Op Note (Addendum)
12/19/2011  3:16 PM  PATIENT:  Jeffrey Sherman  78 y.o. male  PRE-OPERATIVE DIAGNOSIS:  right closed distal radius fracture  POST-OPERATIVE DIAGNOSIS:  right closed distal radius fracture  PROCEDURE:  Procedure(s) (LRB): OPEN REDUCTION INTERNAL FIXATION (ORIF) WRIST FRACTURE (Right)  SURGEON:  Surgeon(s) and Role:    * Vickki Hearing, MD - Primary  PHYSICIAN ASSISTANT:   ASSISTANTS: BETTY ASHLEY    ANESTHESIA:   general  EBL:  Total I/O In: 600 [I.V.:600] Out: 0   BLOOD ADMINISTERED:none  DRAINS: none   LOCAL MEDICATIONS USED:  MARCAINE     SPECIMEN:  No Specimen  DISPOSITION OF SPECIMEN:  N/A  COUNTS:  YES  TOURNIQUET:  * Missing tourniquet times found for documented tourniquets in log:  16109 *  DICTATION: .Dragon Dictation  PLAN OF CARE: Admit for overnight observation  PATIENT DISPOSITION:  PACU - guarded condition.   Delay start of Pharmacological VTE agent (>24hrs) due to surgical blood loss or risk of bleeding: not applicable

## 2011-12-19 NOTE — Anesthesia Preprocedure Evaluation (Addendum)
Anesthesia Evaluation  Patient identified by MRN, date of birth, ID band Patient awake    Reviewed: Allergy & Precautions, H&P , NPO status , Patient's Chart, lab work & pertinent test results  Airway Mallampati: II      Dental  (+) Edentulous Upper, Missing and Poor Dentition   Pulmonary shortness of breath and with exertion,  RA sta=92% breath sounds clear to auscultation        Cardiovascular hypertension, Pt. on medications + dysrhythmias (in NSR now) Atrial Fibrillation + Valvular Problems/Murmurs Rhythm:Regular Rate:Normal     Neuro/Psych Anxiety    GI/Hepatic PUD, GERD-  Medicated and Controlled,  Endo/Other    Renal/GU      Musculoskeletal   Abdominal   Peds  Hematology   Anesthesia Other Findings   Reproductive/Obstetrics                           Anesthesia Physical Anesthesia Plan  ASA: III  Anesthesia Plan: General   Post-op Pain Management:    Induction: Intravenous, Rapid sequence and Cricoid pressure planned  Airway Management Planned: Oral ETT  Additional Equipment:   Intra-op Plan:   Post-operative Plan: Extubation in OR  Informed Consent: I have reviewed the patients History and Physical, chart, labs and discussed the procedure including the risks, benefits and alternatives for the proposed anesthesia with the patient or authorized representative who has indicated his/her understanding and acceptance.     Plan Discussed with:   Anesthesia Plan Comments: (Amidate induction.)        Anesthesia Quick Evaluation

## 2011-12-19 NOTE — Anesthesia Postprocedure Evaluation (Addendum)
  Anesthesia Post-op Note  Patient: Jeffrey Sherman  Procedure(s) Performed: Procedure(s) (LRB): OPEN REDUCTION INTERNAL FIXATION (ORIF) WRIST FRACTURE (Right)  Patient Location: PACU  Anesthesia Type: General  Level of Consciousness: awake, alert , oriented and patient cooperative  Airway and Oxygen Therapy: Patient Spontanous Breathing and Patient connected to face mask oxygen  Post-op Pain: none  Post-op Assessment: Post-op Vital signs reviewed, Patient's Cardiovascular Status Stable, Respiratory Function Stable and Patent Airway  Post-op Vital Signs: Reviewed and stable  Complications: No apparent anesthesia complications  12/20/11  Patient doing well, No PONV.  VSS.  No apparent anesthesia complications.

## 2011-12-19 NOTE — Anesthesia Procedure Notes (Addendum)
Procedure Name: Intubation Date/Time: 12/19/2011 1:37 PM Performed by: Carolyne Littles, Letina Luckett L Pre-anesthesia Checklist: Patient identified, Patient being monitored, Timeout performed, Emergency Drugs available and Suction available Patient Re-evaluated:Patient Re-evaluated prior to inductionOxygen Delivery Method: Circle System Utilized Preoxygenation: Pre-oxygenation with 100% oxygen Intubation Type: IV induction, Rapid sequence and Cricoid Pressure applied Laryngoscope Size: 3 and Miller Grade View: Grade I Tube type: Oral Tube size: 7.0 mm Number of attempts: 1 Airway Equipment and Method: stylet Placement Confirmation: ETT inserted through vocal cords under direct vision,  positive ETCO2 and breath sounds checked- equal and bilateral Secured at: 21 cm Tube secured with: Tape Dental Injury: Teeth and Oropharynx as per pre-operative assessment

## 2011-12-19 NOTE — Op Note (Signed)
12/19/2011  3:16 PM  PATIENT:  Jeffrey Sherman  78 y.o. male  PRE-OPERATIVE DIAGNOSIS:  right closed distal radius fracture  POST-OPERATIVE DIAGNOSIS:  right closed distal radius fracture  PROCEDURE:  Procedure(s) (LRB): OPEN REDUCTION INTERNAL FIXATION (ORIF) WRIST FRACTURE (Right)  SURGEON:  Surgeon(s) and Role:    * Vickki Hearing, MD - Primary  PHYSICIAN ASSISTANT:   ASSISTANTS: BETTY ASHLEY    ANESTHESIA:   general  EBL:  Total I/O In: 600 [I.V.:600] Out: 0   BLOOD ADMINISTERED:none  DRAINS: none   LOCAL MEDICATIONS USED:  MARCAINE     SPECIMEN:  No Specimen  DISPOSITION OF SPECIMEN:  N/A  COUNTS:  YES  TOURNIQUET:  * Missing tourniquet times found for documented tourniquets in log:  16109 *  DICTATION: .Dragon Dictation  PLAN OF CARE: Admit for overnight observation  PATIENT DISPOSITION:  PACU - guarded condition.   Delay start of Pharmacological VTE agent (>24hrs) due to surgical blood loss or risk of bleeding: not applicable  The patient was taken to the operating room after site marking and chart update. Antibiotics were started using Ancef.  general anesthesia was administered   His right arm was prepped with Betadine and draped sterilely timeout was completed  A volar incision was made over the right wrist subcutaneous tissue was divided the flexor carpi radialis tendon was identified the tendon sheath was split down to the scaphoid tubercle. The radial artery was protected. The floor of the tendon was explored and opened the pronator quadratus was ruptured. The first extensor compartment was identified the brachial radialis was cut in a step cut fashion and retracted. Subperiosteal dissection expose the fracture which was reduced with manual traction and rotation. A K wire was placed in the fracture fragments to hold the bone in place and then a plate was applied to the radius using the DVR system. The oblong hole was used first a 2.5 drill bit  drill the hole the screw was placed the plate was adjusted using a K wire to confirm position of the plate in relation to the subchondral bone  Smooth pegs were applied per manufacture technique  2 additional screws were placed in the shaft  AP and lateral x-rays which were taken throughout the case confirmed reduction of the fracture and the position of the plate and hardware  The wounds were irrigated the brachial radialis tendon was repaired with 2-0 Monocryl suture  The skin was closed with 2-0 Monocryl and staples. Marcaine plain 30 cc were injected into the wound edges.  A volar splint was applied over the sterile dressing  After the splint is changed a cast will be applied and he'll wear that for 6 weeks and then a removable splint and start range of motion exercises at that time  He is having some issues with his blood pressure and will be on overnight admission status.

## 2011-12-19 NOTE — Preoperative (Signed)
Beta Blockers   Reason not to administer Beta Blockers:Not Applicable 

## 2011-12-19 NOTE — Addendum Note (Signed)
Addendum  created 12/19/11 1549 by Marolyn Hammock, CRNA   Modules edited:Charges VN

## 2011-12-19 NOTE — Interval H&P Note (Signed)
History and Physical Interval Note:  12/19/2011 1:11 PM  Jeffrey Sherman  has presented today for surgery, with the diagnosis of right closed distal radius fracture  The various methods of treatment have been discussed with the patient and family. After consideration of risks, benefits and other options for treatment, the patient has consented to  Procedure(s) (LRB): OPEN REDUCTION INTERNAL FIXATION (ORIF) WRIST FRACTURE (Right) as a surgical intervention .  The patient's history has been reviewed, patient examined, no change in status, stable for surgery.  I have reviewed the patients' chart and labs.  Questions were answered to the patient's satisfaction.     Fuller Canada

## 2011-12-19 NOTE — Progress Notes (Signed)
Pt states "I have not had a bowel movement in 7 days." Stomach is taut and firm. Bowel sounds are active. Dr. Romeo Apple made aware via page and order for one time soap suds enema obtained. Pt states he would like to rest at this time. Ask that we return later and administer enema.

## 2011-12-20 ENCOUNTER — Encounter (HOSPITAL_COMMUNITY): Payer: Self-pay | Admitting: Orthopedic Surgery

## 2011-12-20 MED ORDER — HYDROCODONE-ACETAMINOPHEN 10-325 MG PO TABS: 1.0000 | ORAL_TABLET | ORAL | Status: AC | PRN

## 2011-12-20 NOTE — Progress Notes (Signed)
Subjective: 1 Day Post-Op Procedure(s) (LRB): OPEN REDUCTION INTERNAL FIXATION (ORIF) WRIST FRACTURE (Right) Patient reports pain as moderate  Objective: Vital signs in last 24 hours: Temp:  [97.9 F (36.6 C)-99.4 F (37.4 C)] 98.4 F (36.9 C) (07/12 0517) Pulse Rate:  [69-94] 94  (07/12 0517) Resp:  [9-25] 18  (07/12 0517) BP: (136-205)/(69-110) 180/76 mmHg (07/12 0517) SpO2:  [90 %-99 %] 92 % (07/12 0517) Weight:  [198 lb 10.2 oz (90.1 kg)] 198 lb 10.2 oz (90.1 kg) (07/11 1651)  Intake/Output from previous day: 07/11 0701 - 07/12 0700 In: 699.2 [I.V.:699.2] Out: 975 [Urine:975] Intake/Output this shift:     Basename 12/18/11 0930  HGB 12.2*    Basename 12/18/11 0930  WBC 6.9  RBC 4.33  HCT 36.6*  PLT 172    Basename 12/18/11 0930  NA 141  K 4.2  CL 105  CO2 29  BUN 18  CREATININE 0.96  GLUCOSE 126*  CALCIUM 9.2    Basename 12/18/11 0930  LABPT --  INR 1.08      Assessment/Plan: 1 Day Post-Op Procedure(s) (LRB): OPEN REDUCTION INTERNAL FIXATION (ORIF) WRIST FRACTURE (Right) Home today  Fuller Canada 12/20/2011, 7:46 AM

## 2011-12-20 NOTE — Addendum Note (Signed)
Addendum  created 12/20/11 0958 by Moshe Salisbury, CRNA   Modules edited:Notes Section

## 2011-12-20 NOTE — Plan of Care (Signed)
Problem: Phase I Progression Outcomes Goal: Incision/dressings dry and intact Outcome: Completed/Met Date Met:  12/20/11 Dressing to rt wrist intact without drainage  Problem: Discharge Progression Outcomes Goal: Other Discharge Outcomes/Goals Outcome: Completed/Met Date Met:  12/20/11 Discharge instructions read to pt and his family both verbalized understanding of instructions  Rx for pain control Discharged to home with family

## 2011-12-20 NOTE — Progress Notes (Deleted)
States feels better. Slept well last PM. No complaints this am. No changes from PM assessment.

## 2011-12-20 NOTE — Discharge Summary (Signed)
Physician Discharge Summary  Patient ID: Jeffrey Sherman MRN: [id number] DOB/AGE: 01-31-1930 78 y.o.  Admit date: 12/19/2011 Discharge date: 12/20/2011  Admission Diagnoses: Fracture right wrist, hypertension  Discharge Diagnoses: Same Active Problems:  * No active hospital problems. *    Discharged Condition: good  Hospital Course: Patient was admitted on July 11 for a right distal radius fracture for internal fixation in the postop period his blood pressure was elevated he was kept overnight for observation blood pressure improved he was allowed to be discharged home BP 180/76  Pulse 94  Temp 98.4 F (36.9 C) (Oral)  Resp 18  Ht 5\' 9"  (1.753 m)  Wt 90.1 kg (198 lb 10.2 oz)  BMI 29.33 kg/m2  SpO2 94%   Consults: None  Significant Diagnostic Studies:   Treatments:   Discharge Exam: Blood pressure 180/76, pulse 94, temperature 98.4 F (36.9 C), temperature source Oral, resp. rate 18, height 5\' 9"  (1.753 m), weight 90.1 kg (198 lb 10.2 oz), SpO2 94.00%. General appearance: alert and cooperative, there is a moderate amount of swelling in the right upper extremity but not unexpected. Neurovascular exam is normal capillary refill is normal  Disposition: 01-Home or Self Care  Discharge Orders    Future Appointments: Provider: Department: Dept Phone: Center:   12/23/2011 2:30 PM Vickki Hearing, MD [facility] [phone] [facility]     Future Orders Please Complete By Expires   Diet - low sodium heart healthy      Call MD / Call 911      Comments:   If you experience chest pain or shortness of breath, CALL 911 and be transported to the hospital emergency room.  If you develope a fever above 101 F, pus (white drainage) or increased drainage or redness at the wound, or calf pain, call your surgeon's office.   Constipation Prevention      Comments:   Drink plenty of fluids.  Prune juice may be helpful.  You may use a stool softener, such as Colace (over the counter)  100 mg twice a day.  Use MiraLax (over the counter) for constipation as needed.   Increase activity slowly as tolerated      Discharge instructions      Comments:   Keep hand elevated and apply ice packs to the macular wrist every 2 hours for 30 minutes     Medication List  As of 12/20/2011 10:12 AM   STOP taking these medications         polyethylene glycol powder powder         TAKE these medications         ALPRAZolam 0.25 MG tablet   Commonly known as: XANAX   Take 0.25 mg by mouth at bedtime as needed. For sleep      aspirin EC 81 MG tablet   Take 81 mg by mouth every morning.      fish oil-omega-3 fatty acids 1000 MG capsule   Take 1 g by mouth daily.      hydrochlorothiazide 25 MG tablet   Commonly known as: HYDRODIURIL   Take 25 mg by mouth daily.      HYDROcodone-acetaminophen 10-325 MG per tablet   Commonly known as: NORCO   Take 1 tablet by mouth every 4 (four) hours as needed. For pain      lisinopril 2.5 MG tablet   Commonly known as: PRINIVIL,ZESTRIL   Take 2.5 mg by mouth daily.      LORazepam 0.5  MG tablet   Commonly known as: ATIVAN   Take 0.5 mg by mouth as needed. For anxiety      MELATONIN PO   Take 1 capsule by mouth at bedtime.      naproxen sodium 220 MG tablet   Commonly known as: ANAPROX   Take 220 mg by mouth every 8 (eight) hours as needed. For pain      omeprazole 20 MG capsule   Commonly known as: PRILOSEC   Take 20 mg by mouth 2 (two) times daily.      OSTEO BI-FLEX ADV JOINT SHIELD PO   Take 2 tablets by mouth daily.      potassium chloride SA 20 MEQ tablet   Commonly known as: K-DUR,KLOR-CON   Take 20 mEq by mouth 2 (two) times daily.      simvastatin 20 MG tablet   Commonly known as: ZOCOR   Take 20 mg by mouth every evening.      terazosin 1 MG capsule   Commonly known as: HYTRIN   Take 1 mg by mouth at bedtime.      venlafaxine XR 150 MG 24 hr capsule   Commonly known as: EFFEXOR-XR   Take 150 mg by mouth daily.        verapamil 40 MG tablet   Commonly known as: CALAN   Take 40 mg by mouth 2 (two) times daily.      vitamin C 500 MG tablet   Commonly known as: ASCORBIC ACID   Take 500 mg by mouth every morning.           Follow-up Information    Follow up on 12/23/2011. (2:30 PM Monday)         Splint change on Monday Signed: Fuller Canada 12/20/2011, 10:12 AM

## 2011-12-20 NOTE — Progress Notes (Signed)
UR chart review completed.  

## 2011-12-20 NOTE — Care Management Note (Signed)
    Page 1 of 1   12/20/2011     11:29:25 AM   CARE MANAGEMENT NOTE 12/20/2011  Patient:  Jeffrey Sherman, Jeffrey Sherman   Account Number:  [id number]  Date Initiated:  12/20/2011  Documentation initiated by:  Sharrie Rothman  Subjective/Objective Assessment:   Pt admitted from home s/p OTIF of right wrist. Pt lives with daughter and will return home with her at discharge. Pt is fairly indpendent with ADL's PTA.     Action/Plan:   No HH or DME needs noted.   Anticipated DC Date:  12/20/2011   Anticipated DC Plan:  HOME/SELF CARE      DC Planning Services  CM consult      Choice offered to / List presented to:             Status of service:  Completed, signed off Medicare Important Message given?   (If response is "NO", the following Medicare IM given date fields will be blank) Date Medicare IM given:   Date Additional Medicare IM given:    Discharge Disposition:  HOME/SELF CARE  Per UR Regulation:    If discussed at Long Length of Stay Meetings, dates discussed:    Comments:  12/20/11 1128 Arlyss Queen, RN BSN CM Pt discharged home today with daughter. No CM needs noted.

## 2011-12-23 ENCOUNTER — Ambulatory Visit (INDEPENDENT_AMBULATORY_CARE_PROVIDER_SITE_OTHER): Payer: Medicare Other | Admitting: Orthopedic Surgery

## 2011-12-23 ENCOUNTER — Encounter: Payer: Self-pay | Admitting: Orthopedic Surgery

## 2011-12-23 VITALS — BP 120/70 | Ht 69.0 in | Wt 198.0 lb

## 2011-12-23 DIAGNOSIS — S62109A Fracture of unspecified carpal bone, unspecified wrist, initial encounter for closed fracture: Secondary | ICD-10-CM

## 2011-12-23 NOTE — Progress Notes (Signed)
Patient ID: Jeffrey Sherman, male   DOB: 1929/08/26, 78 y.o.   MRN: [id number] Chief Complaint  Patient presents with  . Routine Post Op    post op 1, right wrist, DOS 12/19/11    BP 120/70  Ht 5\' 9"  (1.753 m)  Wt 198 lb (89.812 kg)  BMI 29.24 kg/m2  PRE-OPERATIVE DIAGNOSIS: right closed distal radius fracture  POST-OPERATIVE DIAGNOSIS: right closed distal radius fracture  PROCEDURE: Procedure(s) (LRB):  OPEN REDUCTION INTERNAL FIXATION (ORIF) WRIST FRACTURE (Right)   The patient presents for dressing change and splint change and wound check  mild swelling is noted. Wounds are redressed including one on the forearm   Application of new splint volar neutral wrist position  Return for x-ray staples out and short arm cast

## 2011-12-23 NOTE — Patient Instructions (Addendum)
Keep elevated and apply ice as needed

## 2011-12-30 ENCOUNTER — Ambulatory Visit (INDEPENDENT_AMBULATORY_CARE_PROVIDER_SITE_OTHER): Payer: Medicare Other | Admitting: Orthopedic Surgery

## 2011-12-30 ENCOUNTER — Encounter: Payer: Self-pay | Admitting: Orthopedic Surgery

## 2011-12-30 ENCOUNTER — Ambulatory Visit (INDEPENDENT_AMBULATORY_CARE_PROVIDER_SITE_OTHER): Payer: Medicare Other

## 2011-12-30 VITALS — BP 104/60 | Ht 69.0 in | Wt 198.0 lb

## 2011-12-30 DIAGNOSIS — S62109A Fracture of unspecified carpal bone, unspecified wrist, initial encounter for closed fracture: Secondary | ICD-10-CM

## 2011-12-30 DIAGNOSIS — S62101A Fracture of unspecified carpal bone, right wrist, initial encounter for closed fracture: Secondary | ICD-10-CM

## 2011-12-30 NOTE — Patient Instructions (Addendum)
Keep  Cast dry   Do not get wet   If it gets wet dry with a hair dryer on low setting and call the office   

## 2011-12-30 NOTE — Progress Notes (Signed)
Patient ID: Jeffrey Sherman, male   DOB: 20-May-1930, 78 y.o.   MRN: [id number] Chief Complaint  Patient presents with  . Follow-up    1 week recheck right wrist    xrays look good dorsal comminutuia   Cast change   SAC

## 2012-01-28 ENCOUNTER — Ambulatory Visit (INDEPENDENT_AMBULATORY_CARE_PROVIDER_SITE_OTHER): Payer: Medicare Other

## 2012-01-28 ENCOUNTER — Encounter: Payer: Self-pay | Admitting: Orthopedic Surgery

## 2012-01-28 ENCOUNTER — Ambulatory Visit (INDEPENDENT_AMBULATORY_CARE_PROVIDER_SITE_OTHER): Payer: Medicare Other | Admitting: Orthopedic Surgery

## 2012-01-28 VITALS — BP 102/64 | Ht 69.0 in | Wt 198.0 lb

## 2012-01-28 DIAGNOSIS — S62101A Fracture of unspecified carpal bone, right wrist, initial encounter for closed fracture: Secondary | ICD-10-CM

## 2012-01-28 DIAGNOSIS — S62109A Fracture of unspecified carpal bone, unspecified wrist, initial encounter for closed fracture: Secondary | ICD-10-CM | POA: Diagnosis not present

## 2012-01-28 NOTE — Progress Notes (Signed)
Patient ID: Jeffrey Sherman, male   DOB: 1929/10/18, 78 y.o.   MRN: [id number] Chief Complaint  Patient presents with  . Follow-up    one month recheck and xrays Right wrist/ july 11 DOS     BP 102/64  Ht 5\' 9"  (1.753 m)  Wt 198 lb (89.812 kg)  BMI 29.24 kg/m2  Postoperative #6 status post open treatment internal fixation, RIGHT wrist with DVR plate.  Abdomen superficial skin abrasion area. He is doing very well. His hand looks good. He has some stiffness.  His x-ray shows good fracture healing and appropriate plate placement.  Recommend 6 weeks in a brace and come back for repeat films and determine if therapy is needed

## 2012-01-28 NOTE — Patient Instructions (Signed)
Soak hand   Wear brace

## 2012-02-04 DIAGNOSIS — R7309 Other abnormal glucose: Secondary | ICD-10-CM | POA: Diagnosis not present

## 2012-02-04 DIAGNOSIS — Z79899 Other long term (current) drug therapy: Secondary | ICD-10-CM | POA: Diagnosis not present

## 2012-02-04 DIAGNOSIS — E785 Hyperlipidemia, unspecified: Secondary | ICD-10-CM | POA: Diagnosis not present

## 2012-02-04 DIAGNOSIS — I10 Essential (primary) hypertension: Secondary | ICD-10-CM | POA: Diagnosis not present

## 2012-02-04 DIAGNOSIS — R55 Syncope and collapse: Secondary | ICD-10-CM | POA: Diagnosis not present

## 2012-02-04 DIAGNOSIS — Z6827 Body mass index (BMI) 27.0-27.9, adult: Secondary | ICD-10-CM | POA: Diagnosis not present

## 2012-02-04 DIAGNOSIS — Z125 Encounter for screening for malignant neoplasm of prostate: Secondary | ICD-10-CM | POA: Diagnosis not present

## 2012-02-04 DIAGNOSIS — M159 Polyosteoarthritis, unspecified: Secondary | ICD-10-CM | POA: Diagnosis not present

## 2012-03-10 ENCOUNTER — Encounter: Payer: Self-pay | Admitting: Orthopedic Surgery

## 2012-03-10 ENCOUNTER — Ambulatory Visit (INDEPENDENT_AMBULATORY_CARE_PROVIDER_SITE_OTHER): Payer: Medicare Other | Admitting: Orthopedic Surgery

## 2012-03-10 ENCOUNTER — Ambulatory Visit (INDEPENDENT_AMBULATORY_CARE_PROVIDER_SITE_OTHER): Payer: Medicare Other

## 2012-03-10 VITALS — BP 120/70 | Ht 69.0 in | Wt 187.0 lb

## 2012-03-10 DIAGNOSIS — G5600 Carpal tunnel syndrome, unspecified upper limb: Secondary | ICD-10-CM

## 2012-03-10 DIAGNOSIS — S62101A Fracture of unspecified carpal bone, right wrist, initial encounter for closed fracture: Secondary | ICD-10-CM

## 2012-03-10 DIAGNOSIS — S62109A Fracture of unspecified carpal bone, unspecified wrist, initial encounter for closed fracture: Secondary | ICD-10-CM

## 2012-03-10 MED ORDER — GABAPENTIN 100 MG PO CAPS: 100.0000 mg | ORAL_CAPSULE | Freq: Every day | ORAL | Status: DC

## 2012-03-10 NOTE — Patient Instructions (Signed)
activities as tolerated  Start gabapentin 100 mg at night take for 2 moths for numbness

## 2012-03-10 NOTE — Progress Notes (Signed)
Patient ID: Jeffrey Sherman, male   DOB: 1930-04-22, 78 y.o.   MRN: [id number] Chief Complaint  Patient presents with  . Follow-up    follow up and xray right wrist, ORIF 12/19/11   C/O numbness in the index finger and thumb.  Xray today: healed fracture distal radius   tinels negative , no carpal tunnel tenderness   This is date range of motion in the index finger. He also has some stiffness in the LEFT hand as well.  Recommend Neurontin 100 mg at night for 2 months if no improvement, then reassess for carpal tunnel syndrome

## 2012-03-17 DIAGNOSIS — Z23 Encounter for immunization: Secondary | ICD-10-CM | POA: Diagnosis not present

## 2012-04-10 DIAGNOSIS — C44319 Basal cell carcinoma of skin of other parts of face: Secondary | ICD-10-CM | POA: Diagnosis not present

## 2012-04-10 DIAGNOSIS — L570 Actinic keratosis: Secondary | ICD-10-CM | POA: Diagnosis not present

## 2012-04-10 DIAGNOSIS — D044 Carcinoma in situ of skin of scalp and neck: Secondary | ICD-10-CM | POA: Diagnosis not present

## 2012-04-23 DIAGNOSIS — Z85828 Personal history of other malignant neoplasm of skin: Secondary | ICD-10-CM | POA: Diagnosis not present

## 2012-04-23 DIAGNOSIS — L980 Pyogenic granuloma: Secondary | ICD-10-CM | POA: Diagnosis not present

## 2012-07-16 DIAGNOSIS — R9431 Abnormal electrocardiogram [ECG] [EKG]: Secondary | ICD-10-CM | POA: Diagnosis not present

## 2012-07-16 DIAGNOSIS — I10 Essential (primary) hypertension: Secondary | ICD-10-CM | POA: Diagnosis not present

## 2012-07-16 DIAGNOSIS — E782 Mixed hyperlipidemia: Secondary | ICD-10-CM | POA: Diagnosis not present

## 2012-07-21 DIAGNOSIS — Z6827 Body mass index (BMI) 27.0-27.9, adult: Secondary | ICD-10-CM | POA: Diagnosis not present

## 2012-07-21 DIAGNOSIS — I10 Essential (primary) hypertension: Secondary | ICD-10-CM | POA: Diagnosis not present

## 2012-07-21 DIAGNOSIS — F411 Generalized anxiety disorder: Secondary | ICD-10-CM | POA: Diagnosis not present

## 2012-07-21 DIAGNOSIS — R7309 Other abnormal glucose: Secondary | ICD-10-CM | POA: Diagnosis not present

## 2012-07-21 DIAGNOSIS — M159 Polyosteoarthritis, unspecified: Secondary | ICD-10-CM | POA: Diagnosis not present

## 2012-11-13 DIAGNOSIS — I10 Essential (primary) hypertension: Secondary | ICD-10-CM | POA: Diagnosis not present

## 2012-11-13 DIAGNOSIS — E781 Pure hyperglyceridemia: Secondary | ICD-10-CM | POA: Diagnosis not present

## 2012-11-13 DIAGNOSIS — R7301 Impaired fasting glucose: Secondary | ICD-10-CM | POA: Diagnosis not present

## 2012-11-13 DIAGNOSIS — E785 Hyperlipidemia, unspecified: Secondary | ICD-10-CM | POA: Diagnosis not present

## 2012-11-13 DIAGNOSIS — Z6826 Body mass index (BMI) 26.0-26.9, adult: Secondary | ICD-10-CM | POA: Diagnosis not present

## 2012-12-28 DIAGNOSIS — R972 Elevated prostate specific antigen [PSA]: Secondary | ICD-10-CM | POA: Diagnosis not present

## 2013-01-05 DIAGNOSIS — C61 Malignant neoplasm of prostate: Secondary | ICD-10-CM | POA: Diagnosis not present

## 2013-01-29 DIAGNOSIS — Z6826 Body mass index (BMI) 26.0-26.9, adult: Secondary | ICD-10-CM | POA: Diagnosis not present

## 2013-01-29 DIAGNOSIS — E785 Hyperlipidemia, unspecified: Secondary | ICD-10-CM | POA: Diagnosis not present

## 2013-01-29 DIAGNOSIS — R55 Syncope and collapse: Secondary | ICD-10-CM | POA: Diagnosis not present

## 2013-01-29 DIAGNOSIS — I10 Essential (primary) hypertension: Secondary | ICD-10-CM | POA: Diagnosis not present

## 2013-03-10 DIAGNOSIS — Z23 Encounter for immunization: Secondary | ICD-10-CM | POA: Diagnosis not present

## 2013-05-17 DIAGNOSIS — H2510 Age-related nuclear cataract, unspecified eye: Secondary | ICD-10-CM | POA: Diagnosis not present

## 2013-05-17 DIAGNOSIS — H52229 Regular astigmatism, unspecified eye: Secondary | ICD-10-CM | POA: Diagnosis not present

## 2013-05-17 DIAGNOSIS — H524 Presbyopia: Secondary | ICD-10-CM | POA: Diagnosis not present

## 2013-05-17 DIAGNOSIS — H5200 Hypermetropia, unspecified eye: Secondary | ICD-10-CM | POA: Diagnosis not present

## 2013-06-09 DIAGNOSIS — F411 Generalized anxiety disorder: Secondary | ICD-10-CM | POA: Diagnosis not present

## 2013-06-09 DIAGNOSIS — Z6827 Body mass index (BMI) 27.0-27.9, adult: Secondary | ICD-10-CM | POA: Diagnosis not present

## 2013-06-09 DIAGNOSIS — H8309 Labyrinthitis, unspecified ear: Secondary | ICD-10-CM | POA: Diagnosis not present

## 2013-07-08 DIAGNOSIS — J019 Acute sinusitis, unspecified: Secondary | ICD-10-CM | POA: Diagnosis not present

## 2013-07-08 DIAGNOSIS — I10 Essential (primary) hypertension: Secondary | ICD-10-CM | POA: Diagnosis not present

## 2013-07-08 DIAGNOSIS — Z6827 Body mass index (BMI) 27.0-27.9, adult: Secondary | ICD-10-CM | POA: Diagnosis not present

## 2013-07-19 DIAGNOSIS — J210 Acute bronchiolitis due to respiratory syncytial virus: Secondary | ICD-10-CM | POA: Diagnosis not present

## 2013-07-19 DIAGNOSIS — J392 Other diseases of pharynx: Secondary | ICD-10-CM | POA: Diagnosis not present

## 2013-10-04 DIAGNOSIS — J210 Acute bronchiolitis due to respiratory syncytial virus: Secondary | ICD-10-CM | POA: Diagnosis not present

## 2013-10-06 DIAGNOSIS — J984 Other disorders of lung: Secondary | ICD-10-CM | POA: Diagnosis not present

## 2013-10-06 DIAGNOSIS — Z6827 Body mass index (BMI) 27.0-27.9, adult: Secondary | ICD-10-CM | POA: Diagnosis not present

## 2013-10-08 DIAGNOSIS — J189 Pneumonia, unspecified organism: Secondary | ICD-10-CM | POA: Diagnosis not present

## 2013-11-17 DIAGNOSIS — J210 Acute bronchiolitis due to respiratory syncytial virus: Secondary | ICD-10-CM | POA: Diagnosis not present

## 2013-11-17 DIAGNOSIS — J392 Other diseases of pharynx: Secondary | ICD-10-CM | POA: Diagnosis not present

## 2013-12-17 ENCOUNTER — Ambulatory Visit (INDEPENDENT_AMBULATORY_CARE_PROVIDER_SITE_OTHER)
Admission: RE | Admit: 2013-12-17 | Discharge: 2013-12-17 | Disposition: A | Discharge status: Home / Self Care | Payer: Medicare Other | Source: Ambulatory Visit | Attending: Emergency Medicine | Admitting: Emergency Medicine

## 2013-12-17 ENCOUNTER — Ambulatory Visit (INDEPENDENT_AMBULATORY_CARE_PROVIDER_SITE_OTHER): Payer: Medicare Other | Admitting: Emergency Medicine

## 2013-12-17 ENCOUNTER — Encounter: Payer: Self-pay | Admitting: Emergency Medicine

## 2013-12-17 VITALS — BP 130/72 | HR 69 | Ht 69.0 in | Wt 190.0 lb

## 2013-12-17 DIAGNOSIS — R0609 Other forms of dyspnea: Secondary | ICD-10-CM

## 2013-12-17 DIAGNOSIS — R059 Cough, unspecified: Secondary | ICD-10-CM | POA: Diagnosis not present

## 2013-12-17 DIAGNOSIS — R0600 Dyspnea, unspecified: Secondary | ICD-10-CM | POA: Insufficient documentation

## 2013-12-17 DIAGNOSIS — R0989 Other specified symptoms and signs involving the circulatory and respiratory systems: Secondary | ICD-10-CM | POA: Diagnosis not present

## 2013-12-17 DIAGNOSIS — R05 Cough: Secondary | ICD-10-CM | POA: Diagnosis not present

## 2013-12-17 NOTE — Assessment & Plan Note (Addendum)
Etiology unclear, but suspect that there are components of some deconditioning following his recent pneumonia/bronchitis, as well as residual congestion, nasal drip and cough. Must also consider pneumonitis or obstructive lung disease.  - We will check a chest x-ray pulmonary function testing - we will start Nasonex to see if this will help with his nocturnal drainage and wheezing - rov to review testing and response

## 2013-12-17 NOTE — Progress Notes (Signed)
Subjective:    Patient ID: Jeffrey Sherman, male    DOB: 1930-04-07, 80 y.o.   MRN: [id number]  HPI 80 yo never smoker, hx HTN, prostate CA, murmur with ? Valvular disease. He presents to evaluate dyspnea. He was well until April. He tells me that he had an episode of coughing, some sputum, SOB and wheeze when laying supine. Dx as bronchitis vs PNA, treated w Levaquin. He improved but breathing not back to baseline. Then he had recurrent event, same sx In June - he was treated with another round abx, now completed. Was also given SABA, not sure whether he did it correctly, didn't really help. He had a CXR at urgent care - questioned a mild PNA.   His complaints now are dyspnea at rest and then worse with exertion, the wheeze he hears at night.  He has some nasal gtt at night. He is weaker. He may have had some LE edema   Review of Systems  Constitutional: Negative for fever and unexpected weight change.  HENT: Positive for rhinorrhea. Negative for congestion, dental problem, ear pain, nosebleeds, postnasal drip, sinus pressure, sneezing, sore throat and trouble swallowing.   Eyes: Negative for redness and itching.  Respiratory: Positive for cough and shortness of breath. Negative for chest tightness and wheezing.   Cardiovascular: Negative for palpitations and leg swelling.  Gastrointestinal: Negative for nausea and vomiting.  Genitourinary: Negative for dysuria.  Musculoskeletal: Negative for joint swelling.  Skin: Negative for rash.  Neurological: Negative for headaches.  Hematological: Does not bruise/bleed easily.  Psychiatric/Behavioral: Negative for dysphoric mood. The patient is not nervous/anxious.     Past Medical History  Diagnosis Date  . Hypertension   . Anxiety   . Shortness of breath   . Heart murmur     "leaking heart valve"  . GERD (gastroesophageal reflux disease)   . Cancer     prostate  . Poor historian   . HOH (hard of hearing)     left     Family History   Problem Relation Age of Onset  . Cancer       History   Social History  . Marital Status: Widowed    Spouse Name: N/A    Number of Children: N/A  . Years of Education: N/A   Occupational History  . retired    Social History Main Topics  . Smoking status: Never Smoker   . Smokeless tobacco: Never Used  . Alcohol Use: No  . Drug Use: No  . Sexual Activity: Not on file   Other Topics Concern  . Not on file   Social History Narrative  . No narrative on file     No Known Allergies   Outpatient Prescriptions Prior to Visit  Medication Sig Dispense Refill  . aspirin EC 81 MG tablet Take 81 mg by mouth every morning.      . fish oil-omega-3 fatty acids 1000 MG capsule Take 1 g by mouth daily.      . hydrochlorothiazide (HYDRODIURIL) 25 MG tablet Take 25 mg by mouth daily.      Marland Kitchen lisinopril (PRINIVIL,ZESTRIL) 2.5 MG tablet Take 2.5 mg by mouth daily.      Marland Kitchen LORazepam (ATIVAN) 0.5 MG tablet Take 0.5 mg by mouth as needed. For anxiety      . Misc Natural Products (OSTEO BI-FLEX ADV JOINT SHIELD PO) Take 2 tablets by mouth daily.      . Nutritional Supplements (MELATONIN PO) Take 1 capsule  by mouth at bedtime.      Marland Kitchen omeprazole (PRILOSEC) 20 MG capsule Take 20 mg by mouth 2 (two) times daily.      . potassium chloride SA (K-DUR,KLOR-CON) 20 MEQ tablet Take 20 mEq by mouth 2 (two) times daily.      . simvastatin (ZOCOR) 20 MG tablet Take 20 mg by mouth every evening.      . terazosin (HYTRIN) 1 MG capsule Take 1 mg by mouth at bedtime.      Marland Kitchen venlafaxine XR (EFFEXOR-XR) 150 MG 24 hr capsule Take 150 mg by mouth daily.      . verapamil (CALAN) 40 MG tablet Take 40 mg by mouth 2 (two) times daily.      . vitamin C (ASCORBIC ACID) 500 MG tablet Take 500 mg by mouth every morning.      Marland Kitchen ALPRAZolam (XANAX) 0.25 MG tablet Take 0.25 mg by mouth at bedtime as needed. For sleep      . gabapentin (NEURONTIN) 100 MG capsule Take 1 capsule (100 mg total) by mouth daily.  30 capsule  1  .  naproxen sodium (ANAPROX) 220 MG tablet Take 220 mg by mouth every 8 (eight) hours as needed. For pain       No facility-administered medications prior to visit.         Objective:   Physical Exam  Filed Vitals:   12/17/13 0945  BP: 130/72  Pulse: 69  Height: 5\' 9"  (1.753 m)  Weight: 190 lb (86.183 kg)  SpO2: 96%   Gen: Pleasant, well-nourished, in no distress,  normal affect  ENT: No lesions,  mouth clear,  oropharynx clear, no postnasal drip  Neck: No JVD, no TMG, no carotid bruits  Lungs: No use of accessory muscles, clear without rales or rhonchi  Cardiovascular: RRR, heart sounds normal, no murmur or gallops, no peripheral edema  Musculoskeletal: No deformities, no cyanosis or clubbing  Neuro: alert, non focal  Skin: Warm, no lesions or rashes     Assessment & Plan:  Dyspnea Etiology unclear, but suspect that there are components of some deconditioning following his recent pneumonia/bronchitis, as well as residual congestion, nasal drip and cough. Must also consider pneumonitis or obstructive lung disease.  - We will check a chest x-ray pulmonary function testing - we will start Nasonex to see if this will help with his nocturnal drainage and wheezing - rov to review testing and response

## 2013-12-17 NOTE — Patient Instructions (Signed)
We will perform a chest x-ray today and attempt to obtain her old films from urgent care We will perform pulmonary function testing at your next office visit Try using Nasonex 2 sprays each nostril in the evening (not right before bedtime).  Follow with Dr Lamonte Sakai next available with full PFT

## 2014-02-02 ENCOUNTER — Ambulatory Visit (INDEPENDENT_AMBULATORY_CARE_PROVIDER_SITE_OTHER): Payer: Medicare Other | Admitting: Emergency Medicine

## 2014-02-02 ENCOUNTER — Encounter: Payer: Self-pay | Admitting: Emergency Medicine

## 2014-02-02 VITALS — BP 134/74 | HR 62 | Ht 67.0 in | Wt 187.0 lb

## 2014-02-02 DIAGNOSIS — R0609 Other forms of dyspnea: Secondary | ICD-10-CM

## 2014-02-02 DIAGNOSIS — R0989 Other specified symptoms and signs involving the circulatory and respiratory systems: Secondary | ICD-10-CM | POA: Diagnosis not present

## 2014-02-02 DIAGNOSIS — R0600 Dyspnea, unspecified: Secondary | ICD-10-CM

## 2014-02-02 LAB — PULMONARY FUNCTION TEST
DL/VA % PRED: 103 %
DL/VA: 4.51 ml/min/mmHg/L
DLCO UNC % PRED: 92 %
DLCO unc: 26.29 ml/min/mmHg
FEF 25-75 POST: 3.55 L/s
FEF 25-75 PRE: 2.26 L/s
FEF2575-%CHANGE-POST: 57 %
FEF2575-%PRED-POST: 221 %
FEF2575-%Pred-Pre: 141 %
FEV1-%Change-Post: 12 %
FEV1-%Pred-Post: 107 %
FEV1-%Pred-Pre: 94 %
FEV1-POST: 2.58 L
FEV1-PRE: 2.29 L
FEV1FVC-%Change-Post: 0 %
FEV1FVC-%PRED-PRE: 112 %
FEV6-%CHANGE-POST: 13 %
FEV6-%PRED-PRE: 90 %
FEV6-%Pred-Post: 102 %
FEV6-POST: 3.25 L
FEV6-Pre: 2.86 L
FEV6FVC-%PRED-POST: 108 %
FEV6FVC-%Pred-Pre: 108 %
FVC-%CHANGE-POST: 13 %
FVC-%PRED-POST: 94 %
FVC-%PRED-PRE: 83 %
FVC-PRE: 2.86 L
FVC-Post: 3.25 L
PRE FEV1/FVC RATIO: 80 %
Post FEV1/FVC ratio: 80 %
Post FEV6/FVC ratio: 100 %
Pre FEV6/FVC Ratio: 100 %
RV % pred: 90 %
RV: 2.29 L
TLC % pred: 103 %
TLC: 6.67 L

## 2014-02-02 NOTE — Progress Notes (Signed)
Subjective:    Patient ID: Jeffrey Sherman, male    DOB: Aug 20, 1929, 80 y.o.   MRN: [id number]  HPI 81 yo never smoker, hx HTN, prostate CA, murmur with ? Valvular disease. He presents to evaluate dyspnea. He was well until April. He tells me that he had an episode of coughing, some sputum, SOB and wheeze when laying supine. Dx as bronchitis vs PNA, treated w Levaquin. He improved but breathing not back to baseline. Then he had recurrent event, same sx In June - he was treated with another round abx, now completed. Was also given SABA, not sure whether he did it correctly, didn't really help. He had a CXR at urgent care - questioned a mild PNA.   His complaints now are dyspnea at rest and then worse with exertion, the wheeze he hears at night.  He has some nasal gtt at night. He is weaker. He may have had some LE edema  ROV 02/02/14 -- follow up visit for dyspnea, showed up initially in 4/'15. He underwent PFT today. He is now doing some better. He still hears some wheezing at night and in the am. He has some dry cough. nasonex seemed to make him worse. He is on lisinopril.    Review of Systems  Constitutional: Negative for fever and unexpected weight change.  HENT: Positive for rhinorrhea. Negative for congestion, dental problem, ear pain, nosebleeds, postnasal drip, sinus pressure, sneezing, sore throat and trouble swallowing.   Eyes: Negative for redness and itching.  Respiratory: Positive for cough and shortness of breath. Negative for chest tightness and wheezing.   Cardiovascular: Negative for palpitations and leg swelling.  Gastrointestinal: Negative for nausea and vomiting.  Genitourinary: Negative for dysuria.  Musculoskeletal: Negative for joint swelling.  Skin: Negative for rash.  Neurological: Negative for headaches.  Hematological: Does not bruise/bleed easily.  Psychiatric/Behavioral: Negative for dysphoric mood. The patient is not nervous/anxious.     Past Medical History    Diagnosis Date  . Hypertension   . Anxiety   . Shortness of breath   . Heart murmur     "leaking heart valve"  . GERD (gastroesophageal reflux disease)   . Cancer     prostate  . Poor historian   . HOH (hard of hearing)     left     Family History  Problem Relation Age of Onset  . Cancer       History   Social History  . Marital Status: Widowed    Spouse Name: N/A    Number of Children: N/A  . Years of Education: N/A   Occupational History  . retired    Social History Main Topics  . Smoking status: Never Smoker   . Smokeless tobacco: Never Used  . Alcohol Use: No  . Drug Use: No  . Sexual Activity: Not on file   Other Topics Concern  . Not on file   Social History Narrative  . No narrative on file     No Known Allergies   Outpatient Prescriptions Prior to Visit  Medication Sig Dispense Refill  . ALPRAZolam (XANAX) 0.25 MG tablet Take 0.25 mg by mouth at bedtime as needed. For sleep      . aspirin EC 81 MG tablet Take 81 mg by mouth every morning.      . fish oil-omega-3 fatty acids 1000 MG capsule Take 1 g by mouth daily.      . hydrochlorothiazide (HYDRODIURIL) 25 MG tablet Take 25 mg  by mouth daily.      Marland Kitchen lisinopril (PRINIVIL,ZESTRIL) 2.5 MG tablet Take 2.5 mg by mouth daily.      Marland Kitchen LORazepam (ATIVAN) 0.5 MG tablet Take 0.5 mg by mouth as needed. For anxiety      . Misc Natural Products (OSTEO BI-FLEX ADV JOINT SHIELD PO) Take 2 tablets by mouth daily.      . naproxen sodium (ANAPROX) 220 MG tablet Take 220 mg by mouth every 8 (eight) hours as needed. For pain      . Nutritional Supplements (MELATONIN PO) Take 1 capsule by mouth at bedtime.      Marland Kitchen omeprazole (PRILOSEC) 20 MG capsule Take 20 mg by mouth 2 (two) times daily.      . potassium chloride SA (K-DUR,KLOR-CON) 20 MEQ tablet Take 20 mEq by mouth 2 (two) times daily.      . simvastatin (ZOCOR) 20 MG tablet Take 20 mg by mouth every evening.      . terazosin (HYTRIN) 1 MG capsule Take 1 mg by  mouth at bedtime.      Marland Kitchen venlafaxine XR (EFFEXOR-XR) 150 MG 24 hr capsule Take 75 mg by mouth daily.       . verapamil (CALAN) 40 MG tablet Take 40 mg by mouth 2 (two) times daily.      . vitamin C (ASCORBIC ACID) 500 MG tablet Take 500 mg by mouth every morning.      . gabapentin (NEURONTIN) 100 MG capsule Take 1 capsule (100 mg total) by mouth daily.  30 capsule  1   No facility-administered medications prior to visit.         Objective:   Physical Exam  Filed Vitals:   02/02/14 1132  BP: 134/74  Pulse: 62  Height: 5\' 7"  (1.702 m)  Weight: 187 lb (84.823 kg)  SpO2: 93%   Gen: Pleasant, well-nourished, in no distress,  normal affect  ENT: No lesions,  mouth clear,  oropharynx clear, no postnasal drip  Neck: No JVD, no TMG, no carotid bruits  Lungs: No use of accessory muscles, clear without rales or rhonchi  Cardiovascular: RRR, heart sounds normal, no murmur or gallops, no peripheral edema  Musculoskeletal: No deformities, no cyanosis or clubbing  Neuro: alert, non focal  Skin: Warm, no lesions or rashes     Assessment & Plan:  Dyspnea PFTs support mild AFL - probably never became clinically relevant until after URI in April.  - will teach him how to do the ProAir respiclick, may be easier to do  - asked him to discuss with Dr Hilma Favors a possible alternative to lisinopril - follow prn

## 2014-02-02 NOTE — Assessment & Plan Note (Signed)
PFTs support mild AFL - probably never became clinically relevant until after URI in April.  - will teach him how to do the ProAir respiclick, may be easier to do  - asked him to discuss with Dr Hilma Favors a possible alternative to lisinopril - follow prn

## 2014-02-02 NOTE — Progress Notes (Signed)
PFT done today. 

## 2014-02-02 NOTE — Patient Instructions (Signed)
We will teach you how to use ProAir. Use 2 puffs if you have shortness of breath or wheezing.  Follow with Dr Lamonte Sakai as needed

## 2014-03-07 ENCOUNTER — Ambulatory Visit (HOSPITAL_COMMUNITY)
Admission: RE | Admit: 2014-03-07 | Discharge: 2014-03-07 | Disposition: A | Discharge status: Home / Self Care | Payer: Medicare Other | Source: Ambulatory Visit | Attending: Family Medicine | Admitting: Family Medicine

## 2014-03-07 ENCOUNTER — Other Ambulatory Visit (HOSPITAL_COMMUNITY): Payer: Self-pay | Admitting: Family Medicine

## 2014-03-07 DIAGNOSIS — M25572 Pain in left ankle and joints of left foot: Secondary | ICD-10-CM

## 2014-03-07 DIAGNOSIS — R609 Edema, unspecified: Secondary | ICD-10-CM | POA: Diagnosis not present

## 2014-03-07 DIAGNOSIS — M25579 Pain in unspecified ankle and joints of unspecified foot: Secondary | ICD-10-CM | POA: Diagnosis not present

## 2014-03-07 DIAGNOSIS — R5381 Other malaise: Secondary | ICD-10-CM | POA: Diagnosis not present

## 2014-03-07 DIAGNOSIS — M7989 Other specified soft tissue disorders: Secondary | ICD-10-CM | POA: Diagnosis not present

## 2014-03-07 DIAGNOSIS — Z6827 Body mass index (BMI) 27.0-27.9, adult: Secondary | ICD-10-CM | POA: Diagnosis not present

## 2014-03-07 DIAGNOSIS — R5383 Other fatigue: Secondary | ICD-10-CM | POA: Diagnosis not present

## 2014-03-07 DIAGNOSIS — E785 Hyperlipidemia, unspecified: Secondary | ICD-10-CM | POA: Diagnosis not present

## 2014-03-07 DIAGNOSIS — C61 Malignant neoplasm of prostate: Secondary | ICD-10-CM | POA: Diagnosis not present

## 2014-03-10 DIAGNOSIS — C61 Malignant neoplasm of prostate: Secondary | ICD-10-CM | POA: Diagnosis not present

## 2014-03-11 ENCOUNTER — Other Ambulatory Visit (HOSPITAL_COMMUNITY): Payer: Self-pay | Admitting: Urology

## 2014-03-11 DIAGNOSIS — C61 Malignant neoplasm of prostate: Secondary | ICD-10-CM

## 2014-03-16 DIAGNOSIS — Z23 Encounter for immunization: Secondary | ICD-10-CM | POA: Diagnosis not present

## 2014-03-28 ENCOUNTER — Encounter (HOSPITAL_COMMUNITY): Payer: Medicare Other

## 2014-03-28 ENCOUNTER — Encounter (HOSPITAL_COMMUNITY)
Admission: RE | Admit: 2014-03-28 | Discharge: 2014-03-28 | Disposition: A | Discharge status: Home / Self Care | Payer: Medicare Other | Source: Ambulatory Visit | Attending: Urology | Admitting: Urology

## 2014-03-28 DIAGNOSIS — N200 Calculus of kidney: Secondary | ICD-10-CM | POA: Diagnosis not present

## 2014-03-28 DIAGNOSIS — C61 Malignant neoplasm of prostate: Secondary | ICD-10-CM | POA: Insufficient documentation

## 2014-03-28 MED ORDER — TECHNETIUM TC 99M MEDRONATE IV KIT
26.0000 | PACK | Freq: Once | INTRAVENOUS | Status: AC | PRN
  Administered 2014-03-28: 26 via INTRAVENOUS

## 2014-04-06 DIAGNOSIS — C61 Malignant neoplasm of prostate: Secondary | ICD-10-CM | POA: Diagnosis not present

## 2014-04-25 DIAGNOSIS — J020 Streptococcal pharyngitis: Secondary | ICD-10-CM | POA: Diagnosis not present

## 2014-04-25 DIAGNOSIS — J0100 Acute maxillary sinusitis, unspecified: Secondary | ICD-10-CM | POA: Diagnosis not present

## 2014-04-25 DIAGNOSIS — J988 Other specified respiratory disorders: Secondary | ICD-10-CM | POA: Diagnosis not present

## 2014-05-08 ENCOUNTER — Emergency Department (HOSPITAL_COMMUNITY)
Admission: EM | Admit: 2014-05-08 | Discharge: 2014-05-08 | Disposition: A | Discharge status: Home / Self Care | Payer: Medicare Other | Attending: Emergency Medicine | Admitting: Emergency Medicine

## 2014-05-08 ENCOUNTER — Encounter (HOSPITAL_COMMUNITY): Payer: Self-pay

## 2014-05-08 DIAGNOSIS — R300 Dysuria: Secondary | ICD-10-CM | POA: Diagnosis not present

## 2014-05-08 DIAGNOSIS — Z79899 Other long term (current) drug therapy: Secondary | ICD-10-CM | POA: Diagnosis not present

## 2014-05-08 DIAGNOSIS — I10 Essential (primary) hypertension: Secondary | ICD-10-CM | POA: Insufficient documentation

## 2014-05-08 DIAGNOSIS — Z8546 Personal history of malignant neoplasm of prostate: Secondary | ICD-10-CM | POA: Diagnosis not present

## 2014-05-08 DIAGNOSIS — Z7982 Long term (current) use of aspirin: Secondary | ICD-10-CM | POA: Insufficient documentation

## 2014-05-08 DIAGNOSIS — R339 Retention of urine, unspecified: Secondary | ICD-10-CM | POA: Diagnosis present

## 2014-05-08 DIAGNOSIS — Z87442 Personal history of urinary calculi: Secondary | ICD-10-CM | POA: Diagnosis not present

## 2014-05-08 DIAGNOSIS — K219 Gastro-esophageal reflux disease without esophagitis: Secondary | ICD-10-CM | POA: Diagnosis not present

## 2014-05-08 DIAGNOSIS — F419 Anxiety disorder, unspecified: Secondary | ICD-10-CM | POA: Insufficient documentation

## 2014-05-08 DIAGNOSIS — R319 Hematuria, unspecified: Secondary | ICD-10-CM | POA: Diagnosis not present

## 2014-05-08 DIAGNOSIS — R011 Cardiac murmur, unspecified: Secondary | ICD-10-CM | POA: Diagnosis not present

## 2014-05-08 DIAGNOSIS — H9190 Unspecified hearing loss, unspecified ear: Secondary | ICD-10-CM | POA: Insufficient documentation

## 2014-05-08 LAB — URINALYSIS, ROUTINE W REFLEX MICROSCOPIC
Bilirubin Urine: NEGATIVE
Glucose, UA: NEGATIVE mg/dL
Ketones, ur: NEGATIVE mg/dL
LEUKOCYTES UA: NEGATIVE
NITRITE: NEGATIVE
PROTEIN: 100 mg/dL — AB
Specific Gravity, Urine: >1.03 — ABNORMAL HIGH (ref 1.005–1.030)
UROBILINOGEN UA: 0.2 mg/dL (ref 0.0–1.0)
pH: 7 (ref 5.0–8.0)

## 2014-05-08 LAB — URINE MICROSCOPIC-ADD ON

## 2014-05-08 MED ORDER — CIPROFLOXACIN HCL 500 MG PO TABS: 500.0000 mg | ORAL_TABLET | Freq: Two times a day (BID) | ORAL | Status: DC

## 2014-05-08 MED ORDER — PHENAZOPYRIDINE HCL 100 MG PO TABS
100.0000 mg | ORAL_TABLET | Freq: Once | ORAL | Status: AC
  Administered 2014-05-08: 100 mg via ORAL
  Filled 2014-05-08: qty 1

## 2014-05-08 MED ORDER — PHENAZOPYRIDINE HCL 97.2 MG PO TABS: 97.0000 mg | ORAL_TABLET | Freq: Three times a day (TID) | ORAL | Status: DC | PRN

## 2014-05-08 MED ORDER — CIPROFLOXACIN HCL 250 MG PO TABS
500.0000 mg | ORAL_TABLET | Freq: Once | ORAL | Status: AC
  Administered 2014-05-08: 500 mg via ORAL
  Filled 2014-05-08: qty 2

## 2014-05-08 NOTE — ED Notes (Signed)
Pt states his bladder does not feel full at this time, he has been going often today with minimal results each time, "dribbles".  63mls in bladder per bladder scanner results

## 2014-05-08 NOTE — Discharge Instructions (Signed)
Dysuria Dysuria is the medical term for pain with urination. There are many causes for dysuria, but urinary tract infection is the most common. If a urinalysis was performed it can show that there is a urinary tract infection. A urine culture confirms that you or your child is sick. You will need to follow up with a healthcare provider because:  If a urine culture was done you will need to know the culture results and treatment recommendations.  If the urine culture was positive, you or your child will need to be put on antibiotics or know if the antibiotics prescribed are the right antibiotics for your urinary tract infection.  If the urine culture is negative (no urinary tract infection), then other causes may need to be explored or antibiotics need to be stopped. Today laboratory work may have been done and there does not seem to be an infection. If cultures were done they will take at least 24 to 48 hours to be completed. Today x-rays may have been taken and they read as normal. No cause can be found for the problems. The x-rays may be re-read by a radiologist and you will be contacted if additional findings are made. You or your child may have been put on medications to help with this problem until you can see your primary caregiver. If the problems get better, see your primary caregiver if the problems return. If you were given antibiotics (medications which kill germs), take all of the mediations as directed for the full course of treatment.  If laboratory work was done, you need to find the results. Leave a telephone number where you can be reached. If this is not possible, make sure you find out how you are to get test results. HOME CARE INSTRUCTIONS   Drink lots of fluids. For adults, drink eight, 8 ounce glasses of clear juice or water a day. For children, replace fluids as suggested by your caregiver.  Empty the bladder often. Avoid holding urine for long periods of time.  After a bowel  movement, women should cleanse front to back, using each tissue only once.  Empty your bladder before and after sexual intercourse.  Take all the medicine given to you until it is gone. You may feel better in a few days, but TAKE ALL MEDICINE.  Avoid caffeine, tea, alcohol and carbonated beverages, because they tend to irritate the bladder.  In men, alcohol may irritate the prostate.  Only take over-the-counter or prescription medicines for pain, discomfort, or fever as directed by your caregiver.  If your caregiver has given you a follow-up appointment, it is very important to keep that appointment. Not keeping the appointment could result in a chronic or permanent injury, pain, and disability. If there is any problem keeping the appointment, you must call back to this facility for assistance. SEEK IMMEDIATE MEDICAL CARE IF:   Back pain develops.  A fever develops.  There is nausea (feeling sick to your stomach) or vomiting (throwing up).  Problems are no better with medications or are getting worse. MAKE SURE YOU:   Understand these instructions.  Will watch your condition.  Will get help right away if you are not doing well or get worse. Document Released: 02/23/2004 Document Revised: 08/19/2011 Document Reviewed: 12/31/2007 [facility] Patient Information 2015 [place], [place]. This information is not intended to replace advice given to you by your health care provider. Make sure you discuss any questions you have with your health care provider.  Hematuria Hematuria is  blood in your urine. It can be caused by a bladder infection, kidney infection, prostate infection, kidney stone, or cancer of your urinary tract. Infections can usually be treated with medicine, and a kidney stone usually will pass through your urine. If neither of these is the cause of your hematuria, further workup to find out the reason may be needed. It is very important that you tell your health care provider  about any blood you see in your urine, even if the blood stops without treatment or happens without causing pain. Blood in your urine that happens and then stops and then happens again can be a symptom of a very serious condition. Also, pain is not a symptom in the initial stages of many urinary cancers. HOME CARE INSTRUCTIONS   Drink lots of fluid, 3-4 quarts a day. If you have been diagnosed with an infection, cranberry juice is especially recommended, in addition to large amounts of water.  Avoid caffeine, tea, and carbonated beverages because they tend to irritate the bladder.  Avoid alcohol because it may irritate the prostate.  Take all medicines as directed by your health care provider.  If you were prescribed an antibiotic medicine, finish it all even if you start to feel better.  If you have been diagnosed with a kidney stone, follow your health care provider's instructions regarding straining your urine to catch the stone.  Empty your bladder often. Avoid holding urine for long periods of time.  After a bowel movement, women should cleanse front to back. Use each tissue only once.  Empty your bladder before and after sexual intercourse if you are a male. SEEK MEDICAL CARE IF:  You develop back pain.  You have a fever.  You have a feeling of sickness in your stomach (nausea) or vomiting.  Your symptoms are not better in 3 days. Return sooner if you are getting worse. SEEK IMMEDIATE MEDICAL CARE IF:   You develop severe vomiting and are unable to keep the medicine down.  You develop severe back or abdominal pain despite taking your medicines.  You begin passing a large amount of blood or clots in your urine.  You feel extremely weak or faint, or you pass out. MAKE SURE YOU:   Understand these instructions.  Will watch your condition.  Will get help right away if you are not doing well or get worse. Document Released: 05/27/2005 Document Revised: 10/11/2013  Document Reviewed: 01/25/2013 [facility] Patient Information 2015 [place], [place]. This information is not intended to replace advice given to you by your health care provider. Make sure you discuss any questions you have with your health care provider.  Possible Urinary Tract Infection Urinary tract infections (UTIs) can develop anywhere along your urinary tract. Your urinary tract is your body's drainage system for removing wastes and extra water. Your urinary tract includes two kidneys, two ureters, a bladder, and a urethra. Your kidneys are a pair of bean-shaped organs. Each kidney is about the size of your fist. They are located below your ribs, one on each side of your spine. CAUSES Infections are caused by microbes, which are microscopic organisms, including fungi, viruses, and bacteria. These organisms are so small that they can only be seen through a microscope. Bacteria are the microbes that most commonly cause UTIs. SYMPTOMS  Symptoms of UTIs may vary by age and gender of the patient and by the location of the infection. Symptoms in young women typically include a frequent and intense urge to urinate and a  painful, burning feeling in the bladder or urethra during urination. Older women and men are more likely to be tired, shaky, and weak and have muscle aches and abdominal pain. A fever may mean the infection is in your kidneys. Other symptoms of a kidney infection include pain in your back or sides below the ribs, nausea, and vomiting. DIAGNOSIS To diagnose a UTI, your caregiver will ask you about your symptoms. Your caregiver also will ask to provide a urine sample. The urine sample will be tested for bacteria and white blood cells. White blood cells are made by your body to help fight infection. TREATMENT  Typically, UTIs can be treated with medication. Because most UTIs are caused by a bacterial infection, they usually can be treated with the use of antibiotics. The choice of antibiotic and  length of treatment depend on your symptoms and the type of bacteria causing your infection. HOME CARE INSTRUCTIONS  If you were prescribed antibiotics, take them exactly as your caregiver instructs you. Finish the medication even if you feel better after you have only taken some of the medication.  Drink enough water and fluids to keep your urine clear or pale yellow.  Avoid caffeine, tea, and carbonated beverages. They tend to irritate your bladder.  Empty your bladder often. Avoid holding urine for long periods of time.  Empty your bladder before and after sexual intercourse.  After a bowel movement, women should cleanse from front to back. Use each tissue only once. SEEK MEDICAL CARE IF:   You have back pain.  You develop a fever.  Your symptoms do not begin to resolve within 3 days. SEEK IMMEDIATE MEDICAL CARE IF:   You have severe back pain or lower abdominal pain.  You develop chills.  You have nausea or vomiting.  You have continued burning or discomfort with urination. MAKE SURE YOU:   Understand these instructions.  Will watch your condition.  Will get help right away if you are not doing well or get worse. Document Released: 03/06/2005 Document Revised: 11/26/2011 Document Reviewed: 07/05/2011 [facility] Patient Information 2015 [place], [place]. This information is not intended to replace advice given to you by your health care provider. Make sure you discuss any questions you have with your health care provider.   Possible Kidney Stones Kidney stones (urolithiasis) are deposits that form inside your kidneys. The intense pain is caused by the stone moving through the urinary tract. When the stone moves, the ureter goes into spasm around the stone. The stone is usually passed in the urine.  CAUSES   A disorder that makes certain neck glands produce too much parathyroid hormone (primary hyperparathyroidism).  A buildup of uric acid crystals, similar to gout in  your joints.  Narrowing (stricture) of the ureter.  A kidney obstruction present at birth (congenital obstruction).  Previous surgery on the kidney or ureters.  Numerous kidney infections. SYMPTOMS   Feeling sick to your stomach (nauseous).  Throwing up (vomiting).  Blood in the urine (hematuria).  Pain that usually spreads (radiates) to the groin.  Frequency or urgency of urination. DIAGNOSIS   Taking a history and physical exam.  Blood or urine tests.  CT scan.  Occasionally, an examination of the inside of the urinary bladder (cystoscopy) is performed. TREATMENT   Observation.  Increasing your fluid intake.  Extracorporeal shock wave lithotripsy--This is a noninvasive procedure that uses shock waves to break up kidney stones.  Surgery may be needed if you have severe pain or persistent obstruction.  There are various surgical procedures. Most of the procedures are performed with the use of small instruments. Only small incisions are needed to accommodate these instruments, so recovery time is minimized. The size, location, and chemical composition are all important variables that will determine the proper choice of action for you. Talk to your health care provider to better understand your situation so that you will minimize the risk of injury to yourself and your kidney.  HOME CARE INSTRUCTIONS   Drink enough water and fluids to keep your urine clear or pale yellow. This will help you to pass the stone or stone fragments.  Strain all urine through the provided strainer. Keep all particulate matter and stones for your health care provider to see. The stone causing the pain may be as small as a grain of salt. It is very important to use the strainer each and every time you pass your urine. The collection of your stone will allow your health care provider to analyze it and verify that a stone has actually passed. The stone analysis will often identify what you can do to  reduce the incidence of recurrences.  Only take over-the-counter or prescription medicines for pain, discomfort, or fever as directed by your health care provider.  Make a follow-up appointment with your health care provider as directed.  Get follow-up X-rays if required. The absence of pain does not always mean that the stone has passed. It may have only stopped moving. If the urine remains completely obstructed, it can cause loss of kidney function or even complete destruction of the kidney. It is your responsibility to make sure X-rays and follow-ups are completed. Ultrasounds of the kidney can show blockages and the status of the kidney. Ultrasounds are not associated with any radiation and can be performed easily in a matter of minutes. SEEK MEDICAL CARE IF:  You experience pain that is progressive and unresponsive to any pain medicine you have been prescribed. SEEK IMMEDIATE MEDICAL CARE IF:   Pain cannot be controlled with the prescribed medicine.  You have a fever or shaking chills.  The severity or intensity of pain increases over 18 hours and is not relieved by pain medicine.  You develop a new onset of abdominal pain.  You feel faint or pass out.  You are unable to urinate. MAKE SURE YOU:   Understand these instructions.  Will watch your condition.  Will get help right away if you are not doing well or get worse. Document Released: 05/27/2005 Document Revised: 01/27/2013 Document Reviewed: 10/28/2012 [facility] Patient Information 2015 [place], [place]. This information is not intended to replace advice given to you by your health care provider. Make sure you discuss any questions you have with your health care provider.

## 2014-05-08 NOTE — ED Notes (Signed)
I am having trouble urinating. Having retention, hard to start urinating per pt. It has been going on for several days, gotten worse per family. History of prostate cancer, and they recently started him back on the shots for prostate cancer. Had a shot last month per patient.

## 2014-05-08 NOTE — ED Provider Notes (Addendum)
TIME SEEN: This chart was scribed for [place], DO by Hilda Lias, ED Scribe. This patient was seen in room APA04/APA04 and the patient's care was started at 9:05 PM.   CHIEF COMPLAINT: Dysuria   HPI: HPI Comments: Jeffrey Sherman is a 80 y.o. male with a hx of prostate cancer who presents to the Emergency Department complaining of dysuria that he describes as burning and sharp with associated discharge that is worsening, and has been present for several days. Pt states that he has had kidney stones before and that this pain is different from his kidney stone pain. Pt also notes that he had diarrhea once yesterday. Pt states that he recently started a new medication for his prostate problems and thinks that the new medication may be what is causing his current symptoms. He denies ever having a urinary tract infection in the past. Pt denies fever, chills, nausea, vomiting, testicular pain or swelling, hematuria, or back pain.     ROS: See HPI Constitutional: no fever  Eyes: no drainage  ENT: no runny nose   Cardiovascular:  no chest pain  Resp: no SOB  GI: no vomiting; no nausea; one episode of diarrhea GU: dysuria; penile discharge; no hematuria; no testicular pain Integumentary: no rash  Allergy: no hives  Musculoskeletal: no leg swelling; no back pain Neurological: no slurred speech ROS otherwise negative  PAST MEDICAL HISTORY/PAST SURGICAL HISTORY:  Past Medical History  Diagnosis Date  . Hypertension   . Anxiety   . Shortness of breath   . Heart murmur     "leaking heart valve"  . GERD (gastroesophageal reflux disease)   . Cancer     prostate  . Poor historian   . HOH (hard of hearing)     left    MEDICATIONS:  Prior to Admission medications   Medication Sig Start Date End Date Taking? Authorizing Provider  ALPRAZolam (XANAX) 0.25 MG tablet Take 0.25 mg by mouth at bedtime as needed. For sleep    Historical Provider, MD  aspirin EC 81 MG tablet Take 81 mg by  mouth every morning.    Historical Provider, MD  fish oil-omega-3 fatty acids 1000 MG capsule Take 1 g by mouth daily.    Historical Provider, MD  gabapentin (NEURONTIN) 100 MG capsule Take 1 capsule (100 mg total) by mouth daily. 03/10/12   Carole Civil, MD  hydrochlorothiazide (HYDRODIURIL) 25 MG tablet Take 25 mg by mouth daily.    Historical Provider, MD  lisinopril (PRINIVIL,ZESTRIL) 2.5 MG tablet Take 2.5 mg by mouth daily.    Historical Provider, MD  LORazepam (ATIVAN) 0.5 MG tablet Take 0.5 mg by mouth as needed. For anxiety    Historical Provider, MD  Misc Natural Products (OSTEO BI-FLEX ADV JOINT SHIELD PO) Take 2 tablets by mouth daily.    Historical Provider, MD  naproxen sodium (ANAPROX) 220 MG tablet Take 220 mg by mouth every 8 (eight) hours as needed. For pain    Historical Provider, MD  Nutritional Supplements (MELATONIN PO) Take 1 capsule by mouth at bedtime.    Historical Provider, MD  omeprazole (PRILOSEC) 20 MG capsule Take 20 mg by mouth 2 (two) times daily.    Historical Provider, MD  potassium chloride SA (K-DUR,KLOR-CON) 20 MEQ tablet Take 20 mEq by mouth 2 (two) times daily.    Historical Provider, MD  simvastatin (ZOCOR) 20 MG tablet Take 20 mg by mouth every evening.    Historical Provider, MD  terazosin (HYTRIN)  1 MG capsule Take 1 mg by mouth at bedtime.    Historical Provider, MD  venlafaxine XR (EFFEXOR-XR) 150 MG 24 hr capsule Take 75 mg by mouth daily.     Historical Provider, MD  verapamil (CALAN) 40 MG tablet Take 40 mg by mouth 2 (two) times daily.    Historical Provider, MD  vitamin C (ASCORBIC ACID) 500 MG tablet Take 500 mg by mouth every morning.    Historical Provider, MD    ALLERGIES:  No Known Allergies  SOCIAL HISTORY:  History  Substance Use Topics  . Smoking status: Never Smoker   . Smokeless tobacco: Never Used  . Alcohol Use: No    FAMILY HISTORY: Family History  Problem Relation Age of Onset  . Cancer      EXAM: BP 127/67 mmHg   Pulse 74  Temp(Src) 98.1 F (36.7 C) (Oral)  Resp 16  Ht 5\' 9"  (1.753 m)  Wt 188 lb (85.276 kg)  BMI 27.75 kg/m2  SpO2 98% CONSTITUTIONAL: Alert and oriented and responds appropriately to questions. Well-appearing; well-nourished, nontoxic, in no distress, pleasant HEAD: Normocephalic EYES: Conjunctivae clear, PERRL ENT: normal nose; no rhinorrhea; moist mucous membranes; pharynx without lesions noted NECK: Supple, no meningismus, no LAD  CARD: RRR; S1 and S2 appreciated; no murmurs, no clicks, no rubs, no gallops RESP: Normal chest excursion without splinting or tachypnea; breath sounds clear and equal bilaterally; no wheezes, no rhonchi, no rales,  ABD/GI: Normal bowel sounds; non-distended; soft, non-tender, no rebound, no guarding GU: normal external genitalia; no penile discharge; normal urethral meatus; circumsized; no testicular pain or masses; no scrotal swelling; no hernia; 2+ femoral pulses bilaterally; no peroneal warmth, erythema, induration, or crepitus  BACK:  The back appears normal and is non-tender to palpation, there is no CVA tenderness EXT: Normal ROM in all joints; non-tender to palpation; no edema; normal capillary refill; no cyanosis    SKIN: Normal color for age and race; warm NEURO: Moves all extremities equally PSYCH: The patient's mood and manner are appropriate. Grooming and personal hygiene are appropriate.    MEDICAL DECISION MAKING: Patient here with dysuria that is concerning for possible UTI.  He describes urinary retention but only has 23 mL of urine in his bladder. He is hemodynamically stable. GU exam normal. Urinalysis, urine culture pending. Flank pain to suggest nephrolithiasis. He is otherwise well-appearing, nontoxic.  ED PROGRESS: Patient's urine shows hemoglobin but no other sign of infection. Discussed with patient that this could be a renal stone that is trying to pass through the urethra. He denies any flank pain and denies that he is having  any pain unless he is to urinate. Have discussed with her that given he does have blood and symptoms of dysuria I will discharge him on Cipro. I do not feel he needs further workup today as he is very well-appearing, nontoxic, in no distress and without other associated symptoms. Have advised him to follow-up with his PCP if his symptoms continue. Have a diet center return to the ER if he develops fevers, flank pain, vomiting, is unable to urinate or is passing clots. He verbalizes understanding and is comfortable with this plan. We'll discharge with prescription for Pyridium as well.  He is already on Terazosin.   I personally performed the services described in this documentation, which was scribed in my presence. The recorded information has been reviewed and is accurate.     [place], DO 05/08/14 [place], DO 05/08/14  [id number] 

## 2014-05-12 DIAGNOSIS — R300 Dysuria: Secondary | ICD-10-CM | POA: Diagnosis not present

## 2014-05-25 DIAGNOSIS — E663 Overweight: Secondary | ICD-10-CM | POA: Diagnosis not present

## 2014-05-25 DIAGNOSIS — Z6827 Body mass index (BMI) 27.0-27.9, adult: Secondary | ICD-10-CM | POA: Diagnosis not present

## 2014-05-25 DIAGNOSIS — E782 Mixed hyperlipidemia: Secondary | ICD-10-CM | POA: Diagnosis not present

## 2014-05-25 DIAGNOSIS — I10 Essential (primary) hypertension: Secondary | ICD-10-CM | POA: Diagnosis not present

## 2014-06-10 DIAGNOSIS — Z9289 Personal history of other medical treatment: Secondary | ICD-10-CM

## 2014-06-10 HISTORY — DX: Personal history of other medical treatment: Z92.89

## 2014-06-24 DIAGNOSIS — H04129 Dry eye syndrome of unspecified lacrimal gland: Secondary | ICD-10-CM | POA: Diagnosis not present

## 2014-08-04 DIAGNOSIS — H16223 Keratoconjunctivitis sicca, not specified as Sjogren's, bilateral: Secondary | ICD-10-CM | POA: Diagnosis not present

## 2014-08-04 DIAGNOSIS — H04123 Dry eye syndrome of bilateral lacrimal glands: Secondary | ICD-10-CM | POA: Diagnosis not present

## 2014-08-09 DIAGNOSIS — D485 Neoplasm of uncertain behavior of skin: Secondary | ICD-10-CM | POA: Diagnosis not present

## 2014-08-09 DIAGNOSIS — Z85828 Personal history of other malignant neoplasm of skin: Secondary | ICD-10-CM | POA: Diagnosis not present

## 2014-08-09 DIAGNOSIS — C44319 Basal cell carcinoma of skin of other parts of face: Secondary | ICD-10-CM | POA: Diagnosis not present

## 2014-08-09 DIAGNOSIS — D0462 Carcinoma in situ of skin of left upper limb, including shoulder: Secondary | ICD-10-CM | POA: Diagnosis not present

## 2014-08-09 DIAGNOSIS — D0439 Carcinoma in situ of skin of other parts of face: Secondary | ICD-10-CM | POA: Diagnosis not present

## 2014-08-15 DIAGNOSIS — C44621 Squamous cell carcinoma of skin of unspecified upper limb, including shoulder: Secondary | ICD-10-CM | POA: Diagnosis not present

## 2014-09-01 DIAGNOSIS — C44310 Basal cell carcinoma of skin of unspecified parts of face: Secondary | ICD-10-CM | POA: Diagnosis not present

## 2014-09-01 DIAGNOSIS — C44320 Squamous cell carcinoma of skin of unspecified parts of face: Secondary | ICD-10-CM | POA: Diagnosis not present

## 2014-09-13 DIAGNOSIS — C61 Malignant neoplasm of prostate: Secondary | ICD-10-CM | POA: Diagnosis not present

## 2014-09-23 DIAGNOSIS — R7301 Impaired fasting glucose: Secondary | ICD-10-CM | POA: Diagnosis not present

## 2014-09-23 DIAGNOSIS — R5383 Other fatigue: Secondary | ICD-10-CM | POA: Diagnosis not present

## 2014-09-23 DIAGNOSIS — Z6828 Body mass index (BMI) 28.0-28.9, adult: Secondary | ICD-10-CM | POA: Diagnosis not present

## 2014-09-23 DIAGNOSIS — E663 Overweight: Secondary | ICD-10-CM | POA: Diagnosis not present

## 2014-09-23 DIAGNOSIS — R0609 Other forms of dyspnea: Secondary | ICD-10-CM | POA: Diagnosis not present

## 2014-10-03 DIAGNOSIS — R0602 Shortness of breath: Secondary | ICD-10-CM | POA: Diagnosis not present

## 2014-10-03 DIAGNOSIS — R001 Bradycardia, unspecified: Secondary | ICD-10-CM | POA: Diagnosis not present

## 2014-10-04 DIAGNOSIS — R0602 Shortness of breath: Secondary | ICD-10-CM | POA: Diagnosis not present

## 2014-10-04 DIAGNOSIS — R001 Bradycardia, unspecified: Secondary | ICD-10-CM | POA: Diagnosis not present

## 2014-10-25 ENCOUNTER — Ambulatory Visit (INDEPENDENT_AMBULATORY_CARE_PROVIDER_SITE_OTHER): Payer: Medicare Other | Admitting: Urology

## 2014-10-25 DIAGNOSIS — C61 Malignant neoplasm of prostate: Secondary | ICD-10-CM

## 2014-10-25 DIAGNOSIS — E291 Testicular hypofunction: Secondary | ICD-10-CM

## 2014-11-08 DIAGNOSIS — R001 Bradycardia, unspecified: Secondary | ICD-10-CM | POA: Diagnosis not present

## 2014-11-14 DIAGNOSIS — R5383 Other fatigue: Secondary | ICD-10-CM | POA: Diagnosis not present

## 2014-12-20 ENCOUNTER — Ambulatory Visit (INDEPENDENT_AMBULATORY_CARE_PROVIDER_SITE_OTHER): Payer: Medicare Other | Admitting: Orthopedic Surgery

## 2014-12-20 ENCOUNTER — Ambulatory Visit (INDEPENDENT_AMBULATORY_CARE_PROVIDER_SITE_OTHER): Payer: Medicare Other

## 2014-12-20 ENCOUNTER — Encounter: Payer: Self-pay | Admitting: Orthopedic Surgery

## 2014-12-20 VITALS — BP 121/51 | Ht 69.0 in | Wt 195.0 lb

## 2014-12-20 DIAGNOSIS — M1712 Unilateral primary osteoarthritis, left knee: Secondary | ICD-10-CM

## 2014-12-20 DIAGNOSIS — M25562 Pain in left knee: Secondary | ICD-10-CM | POA: Diagnosis not present

## 2014-12-20 NOTE — Progress Notes (Signed)
Patient ID: Jeffrey Sherman, male   DOB: Oct 23, 1929, 81 y.o.   MRN: [id number] New Problem   Chief Complaint  Patient presents with  . Knee Pain    left knee pain, no known injury     Jeffrey Sherman is a 81 y.o. male.   HPI This 81 year old male presents with a ten-day history of severe pain over the medial aspect of his left knee which progressed to stiffness stabbing aching sharp constant 7 out of 10 discomfort without any history of trauma  System review shortness of breath wheezing ankle leg edema excessive nighttime urination burning pain in his legs dizziness lightheadedness joint pain stiffness joints and swollen joints   Review of Systems See hpi  Past Medical History  Diagnosis Date  . Hypertension   . Anxiety   . Shortness of breath   . Heart murmur     "leaking heart valve"  . GERD (gastroesophageal reflux disease)   . Cancer     prostate  . Poor historian   . HOH (hard of hearing)     left    Past Surgical History  Procedure Laterality Date  . Esophagogastroduodenoscopy    . Orif wrist fracture  12/19/2011    Procedure: OPEN REDUCTION INTERNAL FIXATION (ORIF) WRIST FRACTURE;  Surgeon: Carole Civil, MD;  Location: [facility];  Service: Orthopedics;  Laterality: Right;    Family History  Problem Relation Age of Onset  . Cancer    . Heart attack Mother   . Heart disease Sister     by pass  . Cancer Brother     Social History History  Substance Use Topics  . Smoking status: Never Smoker   . Smokeless tobacco: Never Used  . Alcohol Use: No    No Known Allergies  Current Outpatient Prescriptions  Medication Sig Dispense Refill  . aspirin EC 81 MG tablet Take 81 mg by mouth every morning.    Marland Kitchen aspirin 500 MG tablet Take 250-500 mg by mouth daily as needed for pain.    . cefdinir (OMNICEF) 300 MG capsule Take 300 mg by mouth 2 (two) times daily.    . ciprofloxacin (CIPRO) 500 MG tablet Take 1 tablet (500 mg total) by mouth 2 (two) times daily. 14  tablet 0  . Flax Oil-Fish Oil-Borage Oil (FISH OIL-FLAX OIL-BORAGE OIL) CAPS Take 1 capsule by mouth daily.    Marland Kitchen gabapentin (NEURONTIN) 100 MG capsule Take 1 capsule (100 mg total) by mouth daily. (Patient not taking: Reported on 05/08/2014) 30 capsule 1  . Glucosamine-Chondroit-Vit C-Mn (GLUCOSAMINE 1500 COMPLEX PO) Take 1 tablet by mouth daily.    . hydrochlorothiazide (HYDRODIURIL) 25 MG tablet Take 25 mg by mouth daily.    Marland Kitchen lisinopril (PRINIVIL,ZESTRIL) 2.5 MG tablet Take 2.5 mg by mouth daily.    Marland Kitchen LORazepam (ATIVAN) 1 MG tablet Take 1 mg by mouth 2 (two) times daily as needed for anxiety.    . Misc Natural Products (OSTEO BI-FLEX ADV JOINT SHIELD PO) Take 2 tablets by mouth daily.    . Multiple Vitamin (MULTIVITAMIN WITH MINERALS) TABS tablet Take 1 tablet by mouth daily.    . Nutritional Supplements (MELATONIN PO) Take 1 capsule by mouth at bedtime.    Marland Kitchen omeprazole (PRILOSEC) 20 MG capsule Take 20 mg by mouth 2 (two) times daily.    . phenazopyridine (PYRIDIUM) 97 MG tablet Take 1 tablet (97 mg total) by mouth 3 (three) times daily as needed for pain. 10 tablet 0  .  potassium chloride SA (K-DUR,KLOR-CON) 20 MEQ tablet Take 20 mEq by mouth 2 (two) times daily.    . simvastatin (ZOCOR) 20 MG tablet Take 20 mg by mouth every evening.    . terazosin (HYTRIN) 5 MG capsule Take 5 mg by mouth at bedtime.    Marland Kitchen venlafaxine XR (EFFEXOR-XR) 75 MG 24 hr capsule Take 75 mg by mouth daily.    . verapamil (CALAN-SR) 240 MG CR tablet Take 240 mg by mouth 2 (two) times daily.    . vitamin C (ASCORBIC ACID) 500 MG tablet Take 500 mg by mouth every morning.     No current facility-administered medications for this visit.       Physical Exam Blood pressure 121/51, height 5\' 9"  (1.753 m), weight 195 lb (88.451 kg). Physical Exam The patient is well developed well nourished and well groomed. Orientation to person place and time is normal  Mood is pleasant. Ambulatory status walking with a  cane  Medial joint line tenderness quite severe. McMurray sign positive. Knee flexion 120. Slight loss of extension less than 5. Stability tests were normal. Muscle tone strength normal in his quads. Skin intact. Sensation normal. Passer exam normal.   Data Reviewed Imaging shows mild to moderate joint space narrowing medially nothing too severe  Assessment Encounter Diagnoses  Name Primary?  . Left knee pain   . Primary osteoarthritis of left knee Yes   Question medial meniscal tear Plan Injection follow-up 6 weeks  Procedure note left knee injection verbal consent was obtained to inject left knee joint  Timeout was completed to confirm the site of injection  The medications used were 40 mg of Depo-Medrol and 1% lidocaine 3 cc  Anesthesia was provided by ethyl chloride and the skin was prepped with alcohol.  After cleaning the skin with alcohol a 20-gauge needle was used to inject the left knee joint. There were no complications. A sterile bandage was applied.

## 2015-01-10 ENCOUNTER — Other Ambulatory Visit: Payer: Self-pay | Admitting: Urology

## 2015-01-10 DIAGNOSIS — C61 Malignant neoplasm of prostate: Secondary | ICD-10-CM

## 2015-01-16 ENCOUNTER — Other Ambulatory Visit: Payer: Self-pay | Admitting: Urology

## 2015-01-16 DIAGNOSIS — I10 Essential (primary) hypertension: Secondary | ICD-10-CM | POA: Diagnosis not present

## 2015-01-16 DIAGNOSIS — C61 Malignant neoplasm of prostate: Secondary | ICD-10-CM

## 2015-01-16 DIAGNOSIS — Z6828 Body mass index (BMI) 28.0-28.9, adult: Secondary | ICD-10-CM | POA: Diagnosis not present

## 2015-01-16 DIAGNOSIS — Z1389 Encounter for screening for other disorder: Secondary | ICD-10-CM | POA: Diagnosis not present

## 2015-01-16 DIAGNOSIS — E291 Testicular hypofunction: Secondary | ICD-10-CM

## 2015-01-16 DIAGNOSIS — E663 Overweight: Secondary | ICD-10-CM | POA: Diagnosis not present

## 2015-01-17 ENCOUNTER — Other Ambulatory Visit (HOSPITAL_COMMUNITY): Payer: TRICARE For Life (TFL)

## 2015-01-30 DIAGNOSIS — C61 Malignant neoplasm of prostate: Secondary | ICD-10-CM | POA: Diagnosis not present

## 2015-01-31 ENCOUNTER — Ambulatory Visit (INDEPENDENT_AMBULATORY_CARE_PROVIDER_SITE_OTHER): Payer: Medicare Other | Admitting: Orthopedic Surgery

## 2015-01-31 VITALS — BP 152/61 | Ht 69.0 in | Wt 194.0 lb

## 2015-01-31 DIAGNOSIS — M25562 Pain in left knee: Secondary | ICD-10-CM

## 2015-01-31 DIAGNOSIS — M1712 Unilateral primary osteoarthritis, left knee: Secondary | ICD-10-CM

## 2015-01-31 NOTE — Patient Instructions (Signed)
We will refer to [facility] for Synvisc injections

## 2015-01-31 NOTE — Progress Notes (Signed)
Patient ID: Jeffrey Sherman, male   DOB: 1930/01/15, 81 y.o.   MRN: [id number]  Follow up visit  Chief Complaint  Patient presents with  . Follow-up    6 week follow up left knee s/p injection    BP 152/61 mmHg  Ht 5\' 9"  (1.753 m)  Wt 194 lb (87.998 kg)  BMI 28.64 kg/m2  Encounter Diagnoses  Name Primary?  . Left knee pain Yes  . Primary osteoarthritis of left knee     Follow-up visit after injection which did give the patient's symptomatic improvement in his left knee pain however he would like to try hyaluronic acid injections. We reviewed his and records and his insurance does not cover [place] doing the injection per [employer] policy  He is interested in seeking this treatment so we will send him to another orthopedist to get the injection

## 2015-02-01 ENCOUNTER — Other Ambulatory Visit: Payer: Self-pay | Admitting: *Deleted

## 2015-02-01 ENCOUNTER — Telehealth: Payer: Self-pay | Admitting: *Deleted

## 2015-02-01 DIAGNOSIS — M1712 Unilateral primary osteoarthritis, left knee: Secondary | ICD-10-CM

## 2015-02-01 NOTE — Telephone Encounter (Signed)
REFERRAL FAXED TO [facility] FOR ORTHOVISC INJECTIONS

## 2015-02-02 NOTE — Telephone Encounter (Signed)
appt wth Dr Veverly Fells 03/21/15 10:30am

## 2015-02-28 ENCOUNTER — Ambulatory Visit (INDEPENDENT_AMBULATORY_CARE_PROVIDER_SITE_OTHER): Payer: Medicare Other | Admitting: Urology

## 2015-02-28 DIAGNOSIS — C61 Malignant neoplasm of prostate: Secondary | ICD-10-CM | POA: Diagnosis not present

## 2015-03-16 DIAGNOSIS — Z23 Encounter for immunization: Secondary | ICD-10-CM | POA: Diagnosis not present

## 2015-04-18 DIAGNOSIS — M1712 Unilateral primary osteoarthritis, left knee: Secondary | ICD-10-CM | POA: Diagnosis not present

## 2015-04-18 DIAGNOSIS — R262 Difficulty in walking, not elsewhere classified: Secondary | ICD-10-CM | POA: Diagnosis not present

## 2015-04-18 DIAGNOSIS — M25561 Pain in right knee: Secondary | ICD-10-CM | POA: Diagnosis not present

## 2015-04-18 DIAGNOSIS — M170 Bilateral primary osteoarthritis of knee: Secondary | ICD-10-CM | POA: Diagnosis not present

## 2015-04-18 DIAGNOSIS — M25562 Pain in left knee: Secondary | ICD-10-CM | POA: Diagnosis not present

## 2015-04-25 DIAGNOSIS — M1712 Unilateral primary osteoarthritis, left knee: Secondary | ICD-10-CM | POA: Diagnosis not present

## 2015-04-25 DIAGNOSIS — R2689 Other abnormalities of gait and mobility: Secondary | ICD-10-CM | POA: Diagnosis not present

## 2015-04-25 DIAGNOSIS — M25561 Pain in right knee: Secondary | ICD-10-CM | POA: Diagnosis not present

## 2015-04-25 DIAGNOSIS — M170 Bilateral primary osteoarthritis of knee: Secondary | ICD-10-CM | POA: Diagnosis not present

## 2015-04-25 DIAGNOSIS — M25562 Pain in left knee: Secondary | ICD-10-CM | POA: Diagnosis not present

## 2015-05-02 DIAGNOSIS — M25562 Pain in left knee: Secondary | ICD-10-CM | POA: Diagnosis not present

## 2015-05-02 DIAGNOSIS — M1712 Unilateral primary osteoarthritis, left knee: Secondary | ICD-10-CM | POA: Diagnosis not present

## 2015-05-09 DIAGNOSIS — M25562 Pain in left knee: Secondary | ICD-10-CM | POA: Diagnosis not present

## 2015-05-09 DIAGNOSIS — M1712 Unilateral primary osteoarthritis, left knee: Secondary | ICD-10-CM | POA: Diagnosis not present

## 2015-05-16 DIAGNOSIS — M25562 Pain in left knee: Secondary | ICD-10-CM | POA: Diagnosis not present

## 2015-05-16 DIAGNOSIS — M1712 Unilateral primary osteoarthritis, left knee: Secondary | ICD-10-CM | POA: Diagnosis not present

## 2015-06-14 DIAGNOSIS — C61 Malignant neoplasm of prostate: Secondary | ICD-10-CM | POA: Diagnosis not present

## 2015-06-27 ENCOUNTER — Ambulatory Visit: Payer: TRICARE For Life (TFL) | Admitting: Internal Medicine

## 2015-06-29 DIAGNOSIS — E785 Hyperlipidemia, unspecified: Secondary | ICD-10-CM | POA: Diagnosis not present

## 2015-06-29 DIAGNOSIS — C61 Malignant neoplasm of prostate: Secondary | ICD-10-CM | POA: Diagnosis not present

## 2015-06-29 DIAGNOSIS — K259 Gastric ulcer, unspecified as acute or chronic, without hemorrhage or perforation: Secondary | ICD-10-CM | POA: Diagnosis not present

## 2015-06-29 DIAGNOSIS — K4090 Unilateral inguinal hernia, without obstruction or gangrene, not specified as recurrent: Secondary | ICD-10-CM | POA: Diagnosis not present

## 2015-06-29 DIAGNOSIS — R7302 Impaired glucose tolerance (oral): Secondary | ICD-10-CM | POA: Diagnosis not present

## 2015-06-29 DIAGNOSIS — Z1389 Encounter for screening for other disorder: Secondary | ICD-10-CM | POA: Diagnosis not present

## 2015-06-29 DIAGNOSIS — Z6828 Body mass index (BMI) 28.0-28.9, adult: Secondary | ICD-10-CM | POA: Diagnosis not present

## 2015-06-29 DIAGNOSIS — R739 Hyperglycemia, unspecified: Secondary | ICD-10-CM | POA: Diagnosis not present

## 2015-07-04 ENCOUNTER — Ambulatory Visit (INDEPENDENT_AMBULATORY_CARE_PROVIDER_SITE_OTHER): Payer: Medicare Other | Admitting: Urology

## 2015-07-04 DIAGNOSIS — C61 Malignant neoplasm of prostate: Secondary | ICD-10-CM | POA: Diagnosis not present

## 2015-07-04 DIAGNOSIS — N401 Enlarged prostate with lower urinary tract symptoms: Secondary | ICD-10-CM | POA: Diagnosis not present

## 2015-07-07 ENCOUNTER — Ambulatory Visit: Payer: TRICARE For Life (TFL) | Admitting: Internal Medicine

## 2015-07-14 DIAGNOSIS — M79642 Pain in left hand: Secondary | ICD-10-CM | POA: Diagnosis not present

## 2015-07-14 DIAGNOSIS — T1490 Injury, unspecified: Secondary | ICD-10-CM | POA: Diagnosis not present

## 2015-07-14 DIAGNOSIS — W5501XA Bitten by cat, initial encounter: Secondary | ICD-10-CM | POA: Diagnosis not present

## 2015-07-17 ENCOUNTER — Encounter: Payer: Self-pay | Admitting: Internal Medicine

## 2015-07-18 DIAGNOSIS — S76211A Strain of adductor muscle, fascia and tendon of right thigh, initial encounter: Secondary | ICD-10-CM | POA: Diagnosis not present

## 2015-07-26 ENCOUNTER — Ambulatory Visit: Payer: TRICARE For Life (TFL) | Admitting: Nurse Practitioner

## 2015-09-13 DIAGNOSIS — Z0000 Encounter for general adult medical examination without abnormal findings: Secondary | ICD-10-CM | POA: Diagnosis not present

## 2015-09-13 DIAGNOSIS — Z6827 Body mass index (BMI) 27.0-27.9, adult: Secondary | ICD-10-CM | POA: Diagnosis not present

## 2015-09-13 DIAGNOSIS — Z1389 Encounter for screening for other disorder: Secondary | ICD-10-CM | POA: Diagnosis not present

## 2015-09-29 DIAGNOSIS — H31001 Unspecified chorioretinal scars, right eye: Secondary | ICD-10-CM | POA: Diagnosis not present

## 2015-09-29 DIAGNOSIS — H52223 Regular astigmatism, bilateral: Secondary | ICD-10-CM | POA: Diagnosis not present

## 2015-09-29 DIAGNOSIS — H5203 Hypermetropia, bilateral: Secondary | ICD-10-CM | POA: Diagnosis not present

## 2015-09-29 DIAGNOSIS — H524 Presbyopia: Secondary | ICD-10-CM | POA: Diagnosis not present

## 2015-10-03 DIAGNOSIS — N401 Enlarged prostate with lower urinary tract symptoms: Secondary | ICD-10-CM | POA: Diagnosis not present

## 2015-10-31 ENCOUNTER — Ambulatory Visit (INDEPENDENT_AMBULATORY_CARE_PROVIDER_SITE_OTHER): Payer: Medicare Other | Admitting: Urology

## 2015-10-31 ENCOUNTER — Other Ambulatory Visit: Payer: Self-pay | Admitting: Urology

## 2015-10-31 DIAGNOSIS — C61 Malignant neoplasm of prostate: Secondary | ICD-10-CM | POA: Diagnosis not present

## 2015-11-01 ENCOUNTER — Other Ambulatory Visit: Payer: Self-pay | Admitting: Urology

## 2015-11-01 DIAGNOSIS — C61 Malignant neoplasm of prostate: Secondary | ICD-10-CM

## 2015-11-07 ENCOUNTER — Encounter (HOSPITAL_COMMUNITY)
Admission: RE | Admit: 2015-11-07 | Discharge: 2015-11-07 | Disposition: A | Discharge status: Home / Self Care | Payer: Medicare Other | Source: Ambulatory Visit | Attending: Urology | Admitting: Urology

## 2015-11-07 ENCOUNTER — Encounter (HOSPITAL_COMMUNITY): Payer: Self-pay

## 2015-11-07 DIAGNOSIS — C61 Malignant neoplasm of prostate: Secondary | ICD-10-CM | POA: Insufficient documentation

## 2015-11-07 MED ORDER — TECHNETIUM TC 99M MEDRONATE IV KIT
25.0000 | PACK | Freq: Once | INTRAVENOUS | Status: AC | PRN
  Administered 2015-11-07: 25 via INTRAVENOUS

## 2015-11-09 ENCOUNTER — Ambulatory Visit (HOSPITAL_COMMUNITY)
Admission: RE | Admit: 2015-11-09 | Discharge: 2015-11-09 | Disposition: A | Discharge status: Home / Self Care | Payer: Medicare Other | Source: Ambulatory Visit | Attending: Urology | Admitting: Urology

## 2015-11-09 DIAGNOSIS — C61 Malignant neoplasm of prostate: Secondary | ICD-10-CM | POA: Diagnosis not present

## 2015-11-09 DIAGNOSIS — M778 Other enthesopathies, not elsewhere classified: Secondary | ICD-10-CM | POA: Insufficient documentation

## 2015-11-09 DIAGNOSIS — J984 Other disorders of lung: Secondary | ICD-10-CM | POA: Diagnosis not present

## 2015-11-09 DIAGNOSIS — K7689 Other specified diseases of liver: Secondary | ICD-10-CM | POA: Diagnosis not present

## 2015-11-09 DIAGNOSIS — N281 Cyst of kidney, acquired: Secondary | ICD-10-CM | POA: Diagnosis not present

## 2015-11-09 DIAGNOSIS — M4807 Spinal stenosis, lumbosacral region: Secondary | ICD-10-CM | POA: Insufficient documentation

## 2015-11-09 DIAGNOSIS — K5730 Diverticulosis of large intestine without perforation or abscess without bleeding: Secondary | ICD-10-CM | POA: Diagnosis not present

## 2015-11-09 DIAGNOSIS — I2510 Atherosclerotic heart disease of native coronary artery without angina pectoris: Secondary | ICD-10-CM | POA: Insufficient documentation

## 2015-11-09 LAB — POCT I-STAT CREATININE: CREATININE: 1.1 mg/dL (ref 0.61–1.24)

## 2015-11-09 MED ORDER — IOPAMIDOL (ISOVUE-300) INJECTION 61%
100.0000 mL | Freq: Once | INTRAVENOUS | Status: AC | PRN
  Administered 2015-11-09: 100 mL via INTRAVENOUS

## 2015-12-27 DIAGNOSIS — F419 Anxiety disorder, unspecified: Secondary | ICD-10-CM | POA: Diagnosis not present

## 2015-12-27 DIAGNOSIS — I10 Essential (primary) hypertension: Secondary | ICD-10-CM | POA: Diagnosis not present

## 2015-12-27 DIAGNOSIS — C61 Malignant neoplasm of prostate: Secondary | ICD-10-CM | POA: Diagnosis not present

## 2015-12-27 DIAGNOSIS — Z6828 Body mass index (BMI) 28.0-28.9, adult: Secondary | ICD-10-CM | POA: Diagnosis not present

## 2016-02-21 DIAGNOSIS — Z6828 Body mass index (BMI) 28.0-28.9, adult: Secondary | ICD-10-CM | POA: Diagnosis not present

## 2016-02-21 DIAGNOSIS — F419 Anxiety disorder, unspecified: Secondary | ICD-10-CM | POA: Diagnosis not present

## 2016-02-21 DIAGNOSIS — E663 Overweight: Secondary | ICD-10-CM | POA: Diagnosis not present

## 2016-02-22 DIAGNOSIS — C61 Malignant neoplasm of prostate: Secondary | ICD-10-CM | POA: Diagnosis not present

## 2016-02-27 ENCOUNTER — Ambulatory Visit (INDEPENDENT_AMBULATORY_CARE_PROVIDER_SITE_OTHER): Payer: Medicare Other | Admitting: Urology

## 2016-02-27 DIAGNOSIS — C61 Malignant neoplasm of prostate: Secondary | ICD-10-CM | POA: Diagnosis not present

## 2016-03-20 DIAGNOSIS — Z23 Encounter for immunization: Secondary | ICD-10-CM | POA: Diagnosis not present

## 2016-05-31 DIAGNOSIS — Z6828 Body mass index (BMI) 28.0-28.9, adult: Secondary | ICD-10-CM | POA: Diagnosis not present

## 2016-05-31 DIAGNOSIS — E782 Mixed hyperlipidemia: Secondary | ICD-10-CM | POA: Diagnosis not present

## 2016-05-31 DIAGNOSIS — Z1389 Encounter for screening for other disorder: Secondary | ICD-10-CM | POA: Diagnosis not present

## 2016-05-31 DIAGNOSIS — K219 Gastro-esophageal reflux disease without esophagitis: Secondary | ICD-10-CM | POA: Diagnosis not present

## 2016-05-31 DIAGNOSIS — E663 Overweight: Secondary | ICD-10-CM | POA: Diagnosis not present

## 2016-05-31 DIAGNOSIS — I10 Essential (primary) hypertension: Secondary | ICD-10-CM | POA: Diagnosis not present

## 2016-05-31 DIAGNOSIS — R1013 Epigastric pain: Secondary | ICD-10-CM | POA: Diagnosis not present

## 2016-06-18 ENCOUNTER — Encounter (INDEPENDENT_AMBULATORY_CARE_PROVIDER_SITE_OTHER): Payer: Self-pay | Admitting: Internal Medicine

## 2016-06-20 ENCOUNTER — Encounter (INDEPENDENT_AMBULATORY_CARE_PROVIDER_SITE_OTHER): Payer: Self-pay | Admitting: Internal Medicine

## 2016-06-20 ENCOUNTER — Encounter (INDEPENDENT_AMBULATORY_CARE_PROVIDER_SITE_OTHER): Payer: Self-pay | Admitting: *Deleted

## 2016-06-20 ENCOUNTER — Other Ambulatory Visit (INDEPENDENT_AMBULATORY_CARE_PROVIDER_SITE_OTHER): Payer: Self-pay | Admitting: Internal Medicine

## 2016-06-20 ENCOUNTER — Encounter (INDEPENDENT_AMBULATORY_CARE_PROVIDER_SITE_OTHER): Payer: Self-pay

## 2016-06-20 ENCOUNTER — Ambulatory Visit (INDEPENDENT_AMBULATORY_CARE_PROVIDER_SITE_OTHER): Payer: Medicare Other | Admitting: Internal Medicine

## 2016-06-20 VITALS — BP 144/80 | HR 60 | Temp 98.1°F | Ht 69.0 in | Wt 194.5 lb

## 2016-06-20 DIAGNOSIS — I1A0 Resistant hypertension: Secondary | ICD-10-CM | POA: Insufficient documentation

## 2016-06-20 DIAGNOSIS — K219 Gastro-esophageal reflux disease without esophagitis: Secondary | ICD-10-CM | POA: Diagnosis not present

## 2016-06-20 DIAGNOSIS — I10 Essential (primary) hypertension: Secondary | ICD-10-CM

## 2016-06-20 DIAGNOSIS — E7800 Pure hypercholesterolemia, unspecified: Secondary | ICD-10-CM

## 2016-06-20 HISTORY — DX: Pure hypercholesterolemia, unspecified: E78.00

## 2016-06-20 HISTORY — DX: Essential (primary) hypertension: I10

## 2016-06-20 NOTE — Progress Notes (Addendum)
Subjective:    Patient ID: Jeffrey Sherman, male    DOB: Oct 11, 1929, 83 y.o.   MRN: [id number]  HPI Referred by Dr. Gerarda Fraction for PUD/EGD. He tells me he is pretty sure he has an ulcer. Symptoms for about a year. Saw Dr. Gerarda Fraction and was given an Rx for Protonix. He says the Protonix has helped. His acid reflux for the most part controlled with Protonix.  He says his acid reflux was terrible before starting the Protonix.  His appetite is good. No weight loss. No abdominal pain.  Has a BM about every 3 days. No melena or BRRB.   05/31/2016 Amylase 51, Lipase 37, H. Pylori less than 0.9 06/28/2016 H and H 14.2 and 41.2    DATE OF PROCEDURE:  03/09/2005       PROCEDURE:  Esophagogastroduodenoscopy with ablation arteriovenous  malformation and Maloney dilation.   ENDOSCOPIST:  Bridgette Habermann, M.D.   INDICATIONS FOR PROCEDURE:  The patient is a 83 year old gentleman who  experienced recent syncope recently in the setting of melena.  He was seen  by Dr. Hilma Favors and was found to have current jelly, grossly guaiac-positive  stool on rectal exam in his office.  He was admitted for further evaluation.   1.  Normal esophagus, status post passage of a 56-French Maloney dilator.  2.  Solitary antral arteriovenous malformation (non-bleeding), otherwise      normal gastric mucosa, status post ablation of the arteriovenous      malformation as described above.  3.  Patent pylorus.  4.  Normal D1 and D2.    08/06/2003 Colonoscopy: diarrhea, bloody.    FINAL DIAGNOSES:  1. No endoscopic evidence of colitis.  2. Sigmoid colon diverticulosis and moderate size external hemorrhoids.  3. A few focal areas of mucosal erythema which is felt to be a nonspecific     finding and would not explain his diarrhea.    Review of Systems Past Medical History:  Diagnosis Date  . Anxiety   . Cancer [facility])    prostate  . Essential hypertension, benign 06/20/2016  . GERD (gastroesophageal reflux disease)     . Heart murmur    "leaking heart valve"  . High cholesterol 06/20/2016  . HOH (hard of hearing)    left  . Hypertension   . Poor historian   . Shortness of breath     Past Surgical History:  Procedure Laterality Date  . ESOPHAGOGASTRODUODENOSCOPY    . ORIF WRIST FRACTURE  12/19/2011   Procedure: OPEN REDUCTION INTERNAL FIXATION (ORIF) WRIST FRACTURE;  Surgeon: Carole Civil, MD;  Location: [facility];  Service: Orthopedics;  Laterality: Right;    No Known Allergies  Current Outpatient Prescriptions on File Prior to Visit  Medication Sig Dispense Refill  . aspirin EC 81 MG tablet Take 81 mg by mouth every morning.    . Flax Oil-Fish Oil-Borage Oil (FISH OIL-FLAX OIL-BORAGE OIL) CAPS Take 1 capsule by mouth daily.    . hydrochlorothiazide (HYDRODIURIL) 25 MG tablet Take 25 mg by mouth daily.    Marland Kitchen lisinopril (PRINIVIL,ZESTRIL) 2.5 MG tablet Take 40 mg by mouth daily.     Marland Kitchen LORazepam (ATIVAN) 1 MG tablet Take 1 mg by mouth 2 (two) times daily as needed for anxiety.    . Multiple Vitamin (MULTIVITAMIN WITH MINERALS) TABS tablet Take 1 tablet by mouth daily.    . Nutritional Supplements (MELATONIN PO) Take 1 capsule by mouth at bedtime.    . potassium chloride SA (  K-DUR,KLOR-CON) 20 MEQ tablet Take 20 mEq by mouth 2 (two) times daily.    . simvastatin (ZOCOR) 20 MG tablet Take 20 mg by mouth every evening.    . terazosin (HYTRIN) 5 MG capsule Take 5 mg by mouth at bedtime.    Marland Kitchen venlafaxine XR (EFFEXOR-XR) 75 MG 24 hr capsule Take 75 mg by mouth daily.    . verapamil (CALAN-SR) 240 MG CR tablet Take 240 mg by mouth 2 (two) times daily.     No current facility-administered medications on file prior to visit.        Objective:   Physical Exam  Vitals:   06/20/16 1015  BP: (!) 144/80  Pulse: 60  Temp: 98.1 F (36.7 C)   Alert and oriented. Skin warm and dry. Oral mucosa is moist.   . Sclera anicteric, conjunctivae is pink. Thyroid not enlarged. No cervical lymphadenopathy. Lungs  clear. Heart regular rate and rhythm.  Abdomen is soft. Bowel sounds are positive. No hepatomegaly. No abdominal masses felt. No tenderness.  No edema to lower extremities.          Assessment & Plan:  GERD.  EGD to rule out PUD.  The risks and benefits such as perforation, bleeding, and infection were reviewed with the patient and is agreeable.

## 2016-06-20 NOTE — Patient Instructions (Signed)
EGD. The risks and benefits such as perforation, bleeding, and infection were reviewed with the patient and is agreeable. 

## 2016-06-24 ENCOUNTER — Encounter (INDEPENDENT_AMBULATORY_CARE_PROVIDER_SITE_OTHER): Payer: Self-pay

## 2016-06-25 DIAGNOSIS — C61 Malignant neoplasm of prostate: Secondary | ICD-10-CM | POA: Diagnosis not present

## 2016-07-02 ENCOUNTER — Ambulatory Visit (INDEPENDENT_AMBULATORY_CARE_PROVIDER_SITE_OTHER): Payer: Medicare Other | Admitting: Urology

## 2016-07-02 DIAGNOSIS — N3281 Overactive bladder: Secondary | ICD-10-CM | POA: Diagnosis not present

## 2016-07-02 DIAGNOSIS — C61 Malignant neoplasm of prostate: Secondary | ICD-10-CM | POA: Diagnosis not present

## 2016-07-02 DIAGNOSIS — R9721 Rising PSA following treatment for malignant neoplasm of prostate: Secondary | ICD-10-CM

## 2016-07-02 DIAGNOSIS — N401 Enlarged prostate with lower urinary tract symptoms: Secondary | ICD-10-CM

## 2016-07-18 ENCOUNTER — Encounter (HOSPITAL_COMMUNITY): Payer: Self-pay | Admitting: *Deleted

## 2016-07-18 ENCOUNTER — Ambulatory Visit (HOSPITAL_COMMUNITY)
Admission: RE | Admit: 2016-07-18 | Discharge: 2016-07-18 | Disposition: A | Discharge status: Home / Self Care | Payer: Medicare Other | Source: Ambulatory Visit | Attending: Internal Medicine | Admitting: Internal Medicine

## 2016-07-18 ENCOUNTER — Encounter (HOSPITAL_COMMUNITY)
Admission: RE | Disposition: A | Discharge status: Home / Self Care | Payer: Self-pay | Source: Ambulatory Visit | Attending: Internal Medicine

## 2016-07-18 DIAGNOSIS — K228 Other specified diseases of esophagus: Secondary | ICD-10-CM | POA: Diagnosis not present

## 2016-07-18 DIAGNOSIS — K3189 Other diseases of stomach and duodenum: Secondary | ICD-10-CM | POA: Diagnosis not present

## 2016-07-18 DIAGNOSIS — Z7982 Long term (current) use of aspirin: Secondary | ICD-10-CM | POA: Diagnosis not present

## 2016-07-18 DIAGNOSIS — F419 Anxiety disorder, unspecified: Secondary | ICD-10-CM | POA: Diagnosis not present

## 2016-07-18 DIAGNOSIS — K2980 Duodenitis without bleeding: Secondary | ICD-10-CM

## 2016-07-18 DIAGNOSIS — Z79899 Other long term (current) drug therapy: Secondary | ICD-10-CM | POA: Diagnosis not present

## 2016-07-18 DIAGNOSIS — E7800 Pure hypercholesterolemia, unspecified: Secondary | ICD-10-CM | POA: Diagnosis not present

## 2016-07-18 DIAGNOSIS — I10 Essential (primary) hypertension: Secondary | ICD-10-CM | POA: Diagnosis not present

## 2016-07-18 DIAGNOSIS — K219 Gastro-esophageal reflux disease without esophagitis: Secondary | ICD-10-CM | POA: Insufficient documentation

## 2016-07-18 DIAGNOSIS — R1013 Epigastric pain: Secondary | ICD-10-CM | POA: Diagnosis not present

## 2016-07-18 HISTORY — PX: ESOPHAGOGASTRODUODENOSCOPY: SHX5428

## 2016-07-18 SURGERY — EGD (ESOPHAGOGASTRODUODENOSCOPY)
Anesthesia: Moderate Sedation

## 2016-07-18 MED ORDER — ENALAPRILAT 1.25 MG/ML IV SOLN
INTRAVENOUS | Status: DC | PRN
  Administered 2016-07-18: 1.25 mg via INTRAVENOUS

## 2016-07-18 MED ORDER — MIDAZOLAM HCL 5 MG/5ML IJ SOLN
INTRAMUSCULAR | Status: DC | PRN
  Administered 2016-07-18 (×4): 1 mg via INTRAVENOUS

## 2016-07-18 MED ORDER — MEPERIDINE HCL 50 MG/ML IJ SOLN
INTRAMUSCULAR | Status: DC | PRN
  Administered 2016-07-18 (×2): 25 mg via INTRAVENOUS

## 2016-07-18 MED ORDER — BUTAMBEN-TETRACAINE-BENZOCAINE 2-2-14 % EX AERO
INHALATION_SPRAY | CUTANEOUS | Status: DC | PRN
  Administered 2016-07-18: 2 via TOPICAL

## 2016-07-18 MED ORDER — MIDAZOLAM HCL 5 MG/5ML IJ SOLN
INTRAMUSCULAR | Status: AC
  Filled 2016-07-18: qty 10

## 2016-07-18 MED ORDER — ENALAPRILAT 1.25 MG/ML IV SOLN
INTRAVENOUS | Status: AC
  Filled 2016-07-18: qty 2

## 2016-07-18 MED ORDER — MEPERIDINE HCL 50 MG/ML IJ SOLN
INTRAMUSCULAR | Status: AC
  Filled 2016-07-18: qty 1

## 2016-07-18 MED ORDER — SODIUM CHLORIDE 0.9 % IV SOLN
INTRAVENOUS | Status: DC
  Administered 2016-07-18: 10:00:00 via INTRAVENOUS

## 2016-07-18 MED ORDER — STERILE WATER FOR IRRIGATION IR SOLN
Status: DC | PRN
  Administered 2016-07-18: 11:00:00

## 2016-07-18 NOTE — H&P (Signed)
Jeffrey Sherman is an 83 y.o. male.   Chief Complaint: Patient is here for EGD. HPI: Patient is a 83 year old Caucasian male who presents with over 6 month history of intermittent epigastric pain and belching. He has not experienced nausea vomiting hematemesis melena or rectal bleeding or weight loss. He also been having frequent heartburn. He states his symptoms reminded him. Peptic ulcer disease. He is on low-dose aspirin and he also takes Advil for headache twice a week. He was begun on pantoprazole by PCP few weeks ago and feels 50% better. He is undergoing diagnostic EGD.  Past Medical History:  Diagnosis Date  . Anxiety   . Cancer [facility])    prostate  . Essential hypertension, benign 06/20/2016  . GERD (gastroesophageal reflux disease)   . Heart murmur    "leaking heart valve"  . High cholesterol 06/20/2016  . HOH (hard of hearing)    left  . Hypertension   . Poor historian   . Shortness of breath     Past Surgical History:  Procedure Laterality Date  . ESOPHAGOGASTRODUODENOSCOPY    . ORIF WRIST FRACTURE  12/19/2011   Procedure: OPEN REDUCTION INTERNAL FIXATION (ORIF) WRIST FRACTURE;  Surgeon: Carole Civil, MD;  Location: [facility];  Service: Orthopedics;  Laterality: Right;  . prostate cancer     Diagnosed in the 90s  . TONSILLECTOMY      Family History  Problem Relation Age of Onset  . Cancer    . Heart attack Mother   . Cancer Brother   . Heart attack Brother   . Heart disease Sister     by pass   Social History:  reports that he has never smoked. He has never used smokeless tobacco. He reports that he does not drink alcohol or use drugs.  Allergies: No Known Allergies  Medications Prior to Admission  Medication Sig Dispense Refill  . alfuzosin (UROXATRAL) 10 MG 24 hr tablet Take 10 mg by mouth daily with breakfast.    . alum & mag hydroxide-simeth (MAALOX/MYLANTA) 200-200-20 MG/5ML suspension Take 30 mLs by mouth every 6 (six) hours as needed for indigestion  or heartburn.    Marland Kitchen aspirin EC 81 MG tablet Take 81 mg by mouth every morning.    . calcium carbonate (TUMS - DOSED IN MG ELEMENTAL CALCIUM) 500 MG chewable tablet Chew 1 tablet by mouth 2 (two) times daily as needed for indigestion or heartburn.     . cholecalciferol (VITAMIN D) 1000 units tablet Take 1,000 Units by mouth daily.    . diphenhydramine-acetaminophen (TYLENOL PM) 25-500 MG TABS tablet Take 1 tablet by mouth at bedtime.     . Flax Oil-Fish Oil-Borage Oil (FISH OIL-FLAX OIL-BORAGE OIL) CAPS Take 1 capsule by mouth daily.    . hydrochlorothiazide (HYDRODIURIL) 25 MG tablet Take 25 mg by mouth daily.    Marland Kitchen lisinopril (PRINIVIL,ZESTRIL) 40 MG tablet Take 40 mg by mouth 2 (two) times daily.    Marland Kitchen LORazepam (ATIVAN) 1 MG tablet Take 1 mg by mouth 2 (two) times daily as needed for anxiety.     . Melatonin 10 MG TABS Take 10 mg by mouth at bedtime.    . pantoprazole (PROTONIX) 40 MG tablet Take 40 mg by mouth daily.    . potassium chloride SA (K-DUR,KLOR-CON) 20 MEQ tablet Take 20 mEq by mouth 2 (two) times daily.    . simvastatin (ZOCOR) 10 MG tablet Take 10 mg by mouth daily.    Marland Kitchen venlafaxine XR (EFFEXOR-XR)  75 MG 24 hr capsule Take 75 mg by mouth daily.    . verapamil (CALAN-SR) 240 MG CR tablet Take 240 mg by mouth 2 (two) times daily.    . vitamin C (ASCORBIC ACID) 500 MG tablet Take 1,000 mg by mouth daily.      No results found for this or any previous visit (from the past 48 hour(s)). No results found.  ROS  Blood pressure (!) 210/89, pulse (!) 56, temperature 97.8 F (36.6 C), temperature source Oral, resp. rate 15, height 5\' 9"  (1.753 m), weight 194 lb (88 kg), SpO2 97 %. Physical Exam  Constitutional: He appears well-developed and well-nourished.  HENT:  Mouth/Throat: Oropharynx is clear and moist.  Eyes: Conjunctivae are normal. No scleral icterus.  Neck: No thyromegaly present.  Cardiovascular: Normal rate, regular rhythm and normal heart sounds.   No murmur  heard. Respiratory: Effort normal and breath sounds normal.  GI:  Abdomen is full, symmetrical soft and nontender without organomegaly or masses.  Musculoskeletal: He exhibits no edema.  Lymphadenopathy:    He has no cervical adenopathy.  Neurological: He is alert.  Skin: Skin is warm and dry.     Assessment/Plan Epigastric pain. GERD. Diagnostic EGD.  Hildred Laser, MD 07/18/2016, 10:45 AM

## 2016-07-18 NOTE — Op Note (Signed)
[facility] Patient Name: Jeffrey Sherman Procedure Date: 07/18/2016 10:46 AM MRN: [id number] Date of Birth: [birth year] Attending MD: Hildred Laser , MD CSN: [id number] Age: 83 Admit Type: Outpatient Procedure:                Upper GI endoscopy Indications:              Epigastric abdominal pain, Follow-up of                            gastro-esophageal reflux disease Providers:                Hildred Laser, MD, Otis Peak B. Sharon Seller, RN, Sherlyn Lees, Technician Referring MD:             Halford Chessman. Md Medicines:                Cetacaine spray, Meperidine 50 mg IV, Midazolam 4                            mg IV Complications:            No immediate complications. Estimated Blood Loss:     Estimated blood loss: none. Procedure:                Pre-Anesthesia Assessment:                           - Prior to the procedure, a History and Physical                            was performed, and patient medications and                            allergies were reviewed. The patient's tolerance of                            previous anesthesia was also reviewed. The risks                            and benefits of the procedure and the sedation                            options and risks were discussed with the patient.                            All questions were answered, and informed consent                            was obtained. Prior Anticoagulants: The patient                            last took aspirin 1 day and ibuprofen 7 days prior  to the procedure. ASA Grade Assessment: II - A                            patient with mild systemic disease. After reviewing                            the risks and benefits, the patient was deemed in                            satisfactory condition to undergo the procedure.                           After obtaining informed consent, the endoscope was                            passed  under direct vision. Throughout the                            procedure, the patient's blood pressure, pulse, and                            oxygen saturations were monitored continuously. The                            [id number]) scope was introduced through the                            mouth, and advanced to the second part of duodenum.                            The upper GI endoscopy was accomplished without                            difficulty. The patient tolerated the procedure                            well. Scope In: 10:57:03 AM Scope Out: 11:01:54 AM Total Procedure Duration: 0 hours 4 minutes 51 seconds  Findings:      The examined esophagus was normal.      The Z-line was irregular and was found 43 cm from the incisors.      The entire examined stomach was normal.      A healed ulcer was found in the duodenal bulb. Adjacent mucosal findings       include congestion and erythema.      The second portion of the duodenum was normal. Impression:               - Normal esophagus.                           - Z-line irregular, 43 cm from the incisors.                           - Normal stomach.                           -  Duodenal scar.                           - Normal second portion of the duodenum.                           - No specimens collected. Moderate Sedation:      Moderate (conscious) sedation was administered by the endoscopy nurse       and supervised by the endoscopist. The following parameters were       monitored: oxygen saturation, heart rate, blood pressure, CO2       capnography and response to care. Total physician intraservice time was       12 minutes. Recommendation:           - Patient has a contact number available for                            emergencies. The signs and symptoms of potential                            delayed complications were discussed with the                            patient. Return to normal activities tomorrow.                             Written discharge instructions were provided to the                            patient.                           - Resume previous diet today.                           - Continue present medications.                           - Perform an H. pylori serology today. Procedure Code(s):        --- Professional ---                           [phone], Esophagogastroduodenoscopy, flexible,                            transoral; diagnostic, including collection of                            specimen(s) by brushing or washing, when performed                            (separate procedure)                           99152, Moderate sedation services provided by the  same physician or other qualified health care                            professional performing the diagnostic or                            therapeutic service that the sedation supports,                            requiring the presence of an independent trained                            observer to assist in the monitoring of the                            patient's level of consciousness and physiological                            status; initial 15 minutes of intraservice time,                            patient age 13 years or older Diagnosis Code(s):        --- Professional ---                           K22.8, Other specified diseases of esophagus                           K31.89, Other diseases of stomach and duodenum                           R10.13, Epigastric pain                           K21.9, Gastro-esophageal reflux disease without                            esophagitis CPT copyright 2016 American Medical Association. All rights reserved. The codes documented in this report are preliminary and upon coder review may  be revised to meet current compliance requirements. Hildred Laser, MD Hildred Laser, MD 07/18/2016 11:12:41 AM This report has been signed  electronically. Number of Addenda: 0

## 2016-07-18 NOTE — Discharge Instructions (Signed)
Resume usual medications and diet. Keep Advil use to minimum. Can take Tylenol up to 2 g per day as needed for headache. No driving for 24 hours. Physician will call with results of blood test.  Esophagogastroduodenoscopy, Care After Introduction Refer to this sheet in the next few weeks. These instructions provide you with information about caring for yourself after your procedure. Your health care provider may also give you more specific instructions. Your treatment has been planned according to current medical practices, but problems sometimes occur. Call your health care provider if you have any problems or questions after your procedure. What can I expect after the procedure? After the procedure, it is common to have:  A sore throat.  Nausea.  Bloating.  Dizziness.  Fatigue. Follow these instructions at home:  Do not eat or drink anything until the numbing medicine (local anesthetic) has worn off and your gag reflex has returned. You will know that the local anesthetic has worn off when you can swallow comfortably.  Do not drive for 24 hours if you received a medicine to help you relax (sedative).  If your health care provider took a tissue sample for testing during the procedure, make sure to get your test results. This is your responsibility. Ask your health care provider or the department performing the test when your results will be ready.  Keep all follow-up visits as told by your health care provider. This is important. Contact a health care provider if:  You cannot stop coughing.  You are not urinating.  You are urinating less than usual. Get help right away if:  You have trouble swallowing.  You cannot eat or drink.  You have throat or chest pain that gets worse.  You are dizzy or light-headed.  You faint.  You have nausea or vomiting.  You have chills.  You have a fever.  You have severe abdominal pain.  You have black, tarry, or bloody  stools. This information is not intended to replace advice given to you by your health care provider. Make sure you discuss any questions you have with your health care provider. Document Released: 05/13/2012 Document Revised: 11/02/2015 Document Reviewed: 04/20/2015  2017 Elsevier

## 2016-07-19 LAB — H. PYLORI ANTIBODY, IGG

## 2016-07-23 ENCOUNTER — Encounter (HOSPITAL_COMMUNITY): Payer: Self-pay | Admitting: Internal Medicine

## 2016-07-26 ENCOUNTER — Emergency Department (HOSPITAL_COMMUNITY)
Admission: EM | Admit: 2016-07-26 | Discharge: 2016-07-26 | Disposition: A | Discharge status: Home / Self Care | Payer: Medicare Other | Attending: Emergency Medicine | Admitting: Emergency Medicine

## 2016-07-26 ENCOUNTER — Encounter (HOSPITAL_COMMUNITY): Payer: Self-pay | Admitting: Emergency Medicine

## 2016-07-26 ENCOUNTER — Emergency Department (HOSPITAL_COMMUNITY): Payer: Medicare Other

## 2016-07-26 DIAGNOSIS — Z1389 Encounter for screening for other disorder: Secondary | ICD-10-CM | POA: Diagnosis not present

## 2016-07-26 DIAGNOSIS — R0789 Other chest pain: Secondary | ICD-10-CM | POA: Diagnosis not present

## 2016-07-26 DIAGNOSIS — Z7982 Long term (current) use of aspirin: Secondary | ICD-10-CM | POA: Diagnosis not present

## 2016-07-26 DIAGNOSIS — R079 Chest pain, unspecified: Secondary | ICD-10-CM | POA: Diagnosis not present

## 2016-07-26 DIAGNOSIS — I10 Essential (primary) hypertension: Secondary | ICD-10-CM | POA: Insufficient documentation

## 2016-07-26 DIAGNOSIS — Z8546 Personal history of malignant neoplasm of prostate: Secondary | ICD-10-CM | POA: Insufficient documentation

## 2016-07-26 DIAGNOSIS — Z79899 Other long term (current) drug therapy: Secondary | ICD-10-CM | POA: Diagnosis not present

## 2016-07-26 DIAGNOSIS — E663 Overweight: Secondary | ICD-10-CM | POA: Diagnosis not present

## 2016-07-26 DIAGNOSIS — Z6828 Body mass index (BMI) 28.0-28.9, adult: Secondary | ICD-10-CM | POA: Diagnosis not present

## 2016-07-26 HISTORY — DX: Other chest pain: R07.89

## 2016-07-26 HISTORY — DX: Personal history of other medical treatment: Z92.89

## 2016-07-26 HISTORY — DX: Other chronic pain: G89.29

## 2016-07-26 LAB — CBC
HCT: 40.7 % (ref 39.0–52.0)
HEMOGLOBIN: 13.7 g/dL (ref 13.0–17.0)
MCH: 28.3 pg (ref 26.0–34.0)
MCHC: 33.7 g/dL (ref 30.0–36.0)
MCV: 84.1 fL (ref 78.0–100.0)
Platelets: 202 10*3/uL (ref 150–400)
RBC: 4.84 MIL/uL (ref 4.22–5.81)
RDW: 13.9 % (ref 11.5–15.5)
WBC: 7.3 10*3/uL (ref 4.0–10.5)

## 2016-07-26 LAB — TROPONIN I
Troponin I: <0.03 ng/mL (ref <0.03)
Troponin I: <0.03 ng/mL (ref <0.03)

## 2016-07-26 LAB — BASIC METABOLIC PANEL
ANION GAP: 9 (ref 5–15)
BUN: 20 mg/dL (ref 6–20)
CO2: 32 mmol/L (ref 22–32)
Calcium: 9.3 mg/dL (ref 8.9–10.3)
Chloride: 100 mmol/L — ABNORMAL LOW (ref 101–111)
Creatinine, Ser: 1.02 mg/dL (ref 0.61–1.24)
GFR calc non Af Amer: >60 mL/min (ref >60)
GLUCOSE: 120 mg/dL — AB (ref 65–99)
POTASSIUM: 3 mmol/L — AB (ref 3.5–5.1)
Sodium: 141 mmol/L (ref 135–145)

## 2016-07-26 LAB — D-DIMER, QUANTITATIVE: D-Dimer, Quant: 0.34 ug/mL-FEU (ref 0.00–0.50)

## 2016-07-26 MED ORDER — POTASSIUM CHLORIDE CRYS ER 20 MEQ PO TBCR
40.0000 meq | EXTENDED_RELEASE_TABLET | Freq: Once | ORAL | Status: AC
  Administered 2016-07-26: 40 meq via ORAL
  Filled 2016-07-26: qty 2

## 2016-07-26 MED ORDER — HYDROCODONE-ACETAMINOPHEN 5-325 MG PO TABS
ORAL_TABLET | ORAL | 0 refills | Status: DC
Start: 2016-07-26 — End: 2017-11-05

## 2016-07-26 MED ORDER — HYDROCODONE-ACETAMINOPHEN 5-325 MG PO TABS
1.0000 | ORAL_TABLET | Freq: Once | ORAL | Status: AC
  Administered 2016-07-26: 1 via ORAL
  Filled 2016-07-26: qty 1

## 2016-07-26 NOTE — ED Notes (Signed)
EKG given to DR. Thurnell Garbe

## 2016-07-26 NOTE — ED Triage Notes (Signed)
Pt reports cp in left chest without radiation since yesterday . Pt was sent by Dr. Hilma Favors.  Pt alert and oriented.

## 2016-07-26 NOTE — Discharge Instructions (Signed)
Take the prescription as directed.  Apply moist heat or ice to the area(s) of discomfort, for 15 minutes at a time, several times per day for the next few days.  Do not fall asleep on a heating or ice pack.  Call your regular medical doctor on Monday to schedule a follow up appointment in the next 3 days.  Return to the Emergency Department immediately if worsening. ° °

## 2016-07-26 NOTE — ED Notes (Signed)
Pt just left for xray

## 2016-07-26 NOTE — ED Provider Notes (Signed)
[facility] DEPT Provider Note   CSN: [id number] Arrival date & time: 07/26/16  1227     History   Chief Complaint Chief Complaint  Patient presents with  . Chest Pain    HPI Jeffrey Sherman is a 83 y.o. male.   Chest Pain      Pt was seen at 1330. Per pt and his family, c/o gradual onset and persistence of multiple intermittent episodes of left sided chest wall "pain" for the past 2 days. Describes the CP as "sharp like a pin," and specifically located at one spot on his left upper chest wall. Episodes occur regardless of activity or rest. Episodes last seconds to one minute maximum. Pt endorses hx of same "for years and years." States he was told "it was muscle pain." Pt was evaluated by his PMD PTA, received 1 SL ntg without change in his symptoms. Pt states he occasionally takes tylenol "that helps." Denies palpitations, no cough/SOB, no abd pain, no N/V/D, no injury, no rash, no fevers, no back pain.     Past Medical History:  Diagnosis Date  . Anxiety   . Cancer [name])    prostate  . Chronic chest wall pain   . Essential hypertension, benign 06/20/2016  . GERD (gastroesophageal reflux disease)   . H/O echocardiogram 2016   normal  . Heart murmur    "leaking heart valve"  . High cholesterol 06/20/2016  . HOH (hard of hearing)    left  . Hypertension   . Poor historian   . Shortness of breath     Patient Active Problem List   Diagnosis Date Noted  . Essential hypertension, benign 06/20/2016  . High cholesterol 06/20/2016  . Gastroesophageal reflux disease without esophagitis 06/20/2016  . Dyspnea 12/17/2013  . Constipation 12/16/2011  . Wrist fracture 12/16/2011    Past Surgical History:  Procedure Laterality Date  . ESOPHAGOGASTRODUODENOSCOPY    . ESOPHAGOGASTRODUODENOSCOPY N/A 07/18/2016   Procedure: ESOPHAGOGASTRODUODENOSCOPY (EGD);  Surgeon: Rogene Houston, MD;  Location: [facility] ENDO SUITE;  Service: Endoscopy;  Laterality: N/A;  3:00-moved to 230 per  Lelon Frohlich  . ORIF WRIST FRACTURE  12/19/2011   Procedure: OPEN REDUCTION INTERNAL FIXATION (ORIF) WRIST FRACTURE;  Surgeon: Carole Civil, MD;  Location: [facility] ORS;  Service: Orthopedics;  Laterality: Right;  . prostate cancer     Diagnosed in the 90s  . TONSILLECTOMY         Home Medications    Prior to Admission medications   Medication Sig Start Date End Date Taking? Authorizing Provider  alfuzosin (UROXATRAL) 10 MG 24 hr tablet Take 10 mg by mouth daily with breakfast.    Historical Provider, MD  alum & mag hydroxide-simeth (MAALOX/MYLANTA) 200-200-20 MG/5ML suspension Take 30 mLs by mouth every 6 (six) hours as needed for indigestion or heartburn.    Historical Provider, MD  aspirin EC 81 MG tablet Take 81 mg by mouth every morning.    Historical Provider, MD  calcium carbonate (TUMS - DOSED IN MG ELEMENTAL CALCIUM) 500 MG chewable tablet Chew 1 tablet by mouth 2 (two) times daily as needed for indigestion or heartburn.     Historical Provider, MD  cholecalciferol (VITAMIN D) 1000 units tablet Take 1,000 Units by mouth daily.    Historical Provider, MD  diphenhydramine-acetaminophen (TYLENOL PM) 25-500 MG TABS tablet Take 1 tablet by mouth at bedtime.     Historical Provider, MD  Flax Oil-Fish Oil-Borage Oil (FISH OIL-FLAX OIL-BORAGE OIL) CAPS Take 1 capsule by mouth  daily.    Historical Provider, MD  hydrochlorothiazide (HYDRODIURIL) 25 MG tablet Take 25 mg by mouth daily.    Historical Provider, MD  lisinopril (PRINIVIL,ZESTRIL) 40 MG tablet Take 40 mg by mouth 2 (two) times daily.    Historical Provider, MD  LORazepam (ATIVAN) 1 MG tablet Take 1 mg by mouth 2 (two) times daily as needed for anxiety.     Historical Provider, MD  Melatonin 10 MG TABS Take 10 mg by mouth at bedtime.    Historical Provider, MD  pantoprazole (PROTONIX) 40 MG tablet Take 40 mg by mouth daily.    Historical Provider, MD  potassium chloride SA (K-DUR,KLOR-CON) 20 MEQ tablet Take 20 mEq by mouth 2 (two) times  daily.    Historical Provider, MD  simvastatin (ZOCOR) 10 MG tablet Take 10 mg by mouth daily.    Historical Provider, MD  venlafaxine XR (EFFEXOR-XR) 75 MG 24 hr capsule Take 75 mg by mouth daily.    Historical Provider, MD  verapamil (CALAN-SR) 240 MG CR tablet Take 240 mg by mouth 2 (two) times daily.    Historical Provider, MD  vitamin C (ASCORBIC ACID) 500 MG tablet Take 1,000 mg by mouth daily.    Historical Provider, MD    Family History Family History  Problem Relation Age of Onset  . Cancer    . Heart attack Mother   . Cancer Brother   . Heart attack Brother   . Heart disease Sister     by pass    Social History Social History  Substance Use Topics  . Smoking status: Never Smoker  . Smokeless tobacco: Never Used  . Alcohol use No     Allergies   Patient has no known allergies.   Review of Systems Review of Systems  Cardiovascular: Positive for chest pain.  ROS: Statement: All systems negative except as marked or noted in the HPI; Constitutional: Negative for fever and chills. ; ; Eyes: Negative for eye pain, redness and discharge. ; ; ENMT: Negative for ear pain, hoarseness, nasal congestion, sinus pressure and sore throat. ; ; Cardiovascular: Negative for palpitations, diaphoresis, dyspnea and peripheral edema. ; ; Respiratory: Negative for cough, wheezing and stridor. ; ; Gastrointestinal: Negative for nausea, vomiting, diarrhea, abdominal pain, blood in stool, hematemesis, jaundice and rectal bleeding. . ; ; Genitourinary: Negative for dysuria, flank pain and hematuria. ; ; Musculoskeletal: +chest wall pain. Negative for back pain and neck pain. Negative for swelling and trauma.; ; Skin: Negative for pruritus, rash, abrasions, blisters, bruising and skin lesion.; ; Neuro: Negative for headache, lightheadedness and neck stiffness. Negative for weakness, altered level of consciousness, altered mental status, extremity weakness, paresthesias, involuntary movement, seizure  and syncope.      Physical Exam Updated Vital Signs BP (!) 203/104 (BP Location: Right Arm)   Pulse 75   Temp 98.2 F (36.8 C) (Oral)   Resp 18   Ht 5\' 9"  (1.753 m)   Wt 194 lb (88 kg)   SpO2 95%   BMI 28.65 kg/m   Patient Vitals for the past 24 hrs:  BP Temp Temp src Pulse Resp SpO2 Height Weight  07/26/16 1500 186/95 - - (!) 59 17 95 % - -  07/26/16 1400 162/91 - - 61 14 96 % - -  07/26/16 1345 - - - 63 13 96 % - -  07/26/16 1330 169/89 - - 63 18 93 % - -  07/26/16 1236 (!) 203/104 98.2 F (36.8 C) Oral 75  18 95 % - -  07/26/16 1234 - - - - - - 5\' 9"  (1.753 m) 194 lb (88 kg)      Physical Exam 1335: Physical examination:  Nursing notes reviewed; Vital signs and O2 SAT reviewed;  Constitutional: Well developed, Well nourished, Well hydrated, In no acute distress; Head:  Normocephalic, atraumatic; Eyes: EOMI, PERRL, No scleral icterus; ENMT: Mouth and pharynx normal, Mucous membranes moist; Neck: Supple, Full range of motion, No lymphadenopathy; Cardiovascular: Regular rate and rhythm, No gallop; Respiratory: Breath sounds clear & equal bilaterally, No wheezes.  Speaking full sentences with ease, Normal respiratory effort/excursion; Chest: +left upper chest wall point tender to palp which reproduces pt's symptoms. No rash, no soft tissue crepitus, no deformity. Movement normal; Abdomen: Soft, Nontender, Nondistended, Normal bowel sounds; Genitourinary: No CVA tenderness; Extremities: Pulses normal, No tenderness, No edema, No calf edema or asymmetry.; Neuro: AA&Ox3, Major CN grossly intact.  Speech clear. No gross focal motor or sensory deficits in extremities.; Skin: Color normal, Warm, Dry.   ED Treatments / Results  Labs (all labs ordered are listed, but only abnormal results are displayed)   EKG  EKG Interpretation  Date/Time:  Friday July 26 2016 12:40:18 EST Ventricular Rate:  74 PR Interval:    QRS Duration: 100 QT Interval:  439 QTC Calculation: 488 R  Axis:   -44 Text Interpretation:  Sinus rhythm with 1st degree A-V block Left axis deviation Inferior infarct, old Baseline wander When compared with ECG of 05/07/2000 No significant change was found Confirmed by [facility]  MD, Nunzio Cory [phone]) on 07/26/2016 1:52:55 PM       Radiology   Procedures Procedures (including critical care time)  Medications Ordered in ED Medications  HYDROcodone-acetaminophen (NORCO/VICODIN) 5-325 MG per tablet 1 tablet (not administered)     Initial Impression / Assessment and Plan / ED Course  I have reviewed the triage vital signs and the nursing notes.  Pertinent labs & imaging results that were available during my care of the patient were reviewed by me and considered in my medical decision making (see chart for details).  MDM Reviewed: previous chart, nursing note and vitals Reviewed previous: labs and ECG Interpretation: labs, ECG and x-ray   Results for orders placed or performed during the hospital encounter of [id number]  Basic metabolic panel  Result Value Ref Range   Sodium 141 135 - 145 mmol/L   Potassium 3.0 (L) 3.5 - 5.1 mmol/L   Chloride 100 (L) 101 - 111 mmol/L   CO2 32 22 - 32 mmol/L   Glucose, Bld 120 (H) 65 - 99 mg/dL   BUN 20 6 - 20 mg/dL   Creatinine, Ser 1.02 0.61 - 1.24 mg/dL   Calcium 9.3 8.9 - 10.3 mg/dL   GFR calc non Af Amer >60 >60 mL/min   GFR calc Af Amer >60 >60 mL/min   Anion gap 9 5 - 15  CBC  Result Value Ref Range   WBC 7.3 4.0 - 10.5 K/uL   RBC 4.84 4.22 - 5.81 MIL/uL   Hemoglobin 13.7 13.0 - 17.0 g/dL   HCT 40.7 39.0 - 52.0 %   MCV 84.1 78.0 - 100.0 fL   MCH 28.3 26.0 - 34.0 pg   MCHC 33.7 30.0 - 36.0 g/dL   RDW 13.9 11.5 - 15.5 %   Platelets 202 150 - 400 K/uL  Troponin I  Result Value Ref Range   Troponin I <0.03 <0.03 ng/mL  D-dimer, quantitative  Result Value Ref Range  D-Dimer, Quant 0.34 0.00 - 0.50 ug/mL-FEU  Troponin I  Result Value Ref Range   Troponin I <0.03 <0.03 ng/mL   Dg Chest  2 View Result Date: 07/26/2016 CLINICAL DATA:  Left chest pain for years, worsening pain last few days EXAM: CHEST  2 VIEW COMPARISON:  12/17/2013 FINDINGS: Cardiomediastinal silhouette is stable. Atherosclerotic calcifications of thoracic aorta again noted. No infiltrate or pleural effusion. No pulmonary edema. Degenerative changes mid thoracic spine. Stable bilateral basilar scarring. IMPRESSION: No active cardiopulmonary disease. Electronically Signed   By: Lahoma Crocker M.D.   On: 07/26/2016 12:59    1645:  Doubt PE as cause for symptoms with normal d-dimer and low risk [place].  Doubt ACS as cause for symptoms with normal troponin and unchanged EKG from previous after 2 days of atypical, chronic symptoms. Pt feels better after pain meds. Potassium repleted PO. Pt wants to go home now. Dx and testing d/w pt and family.  Questions answered.  Verb understanding, agreeable to d/c home with outpt f/u.     Final Clinical Impressions(s) / ED Diagnoses   Final diagnoses:  None    New Prescriptions New Prescriptions   No medications on file      Francine Graven, DO 07/29/16 1541

## 2016-08-02 ENCOUNTER — Encounter (INDEPENDENT_AMBULATORY_CARE_PROVIDER_SITE_OTHER): Payer: Self-pay | Admitting: Internal Medicine

## 2016-08-02 NOTE — Progress Notes (Signed)
Patient was given an appointment for 10/30/16 at 11:30am with Deberah Castle, NP.  A letter was mailed to the patient.

## 2016-08-08 DIAGNOSIS — I10 Essential (primary) hypertension: Secondary | ICD-10-CM | POA: Diagnosis not present

## 2016-08-08 DIAGNOSIS — E785 Hyperlipidemia, unspecified: Secondary | ICD-10-CM | POA: Diagnosis not present

## 2016-08-08 DIAGNOSIS — E782 Mixed hyperlipidemia: Secondary | ICD-10-CM | POA: Diagnosis not present

## 2016-08-08 DIAGNOSIS — R7309 Other abnormal glucose: Secondary | ICD-10-CM | POA: Diagnosis not present

## 2016-08-08 DIAGNOSIS — Z6829 Body mass index (BMI) 29.0-29.9, adult: Secondary | ICD-10-CM | POA: Diagnosis not present

## 2016-08-08 DIAGNOSIS — C61 Malignant neoplasm of prostate: Secondary | ICD-10-CM | POA: Diagnosis not present

## 2016-09-25 ENCOUNTER — Other Ambulatory Visit: Payer: Self-pay | Admitting: Urology

## 2016-09-25 DIAGNOSIS — E782 Mixed hyperlipidemia: Secondary | ICD-10-CM | POA: Diagnosis not present

## 2016-09-25 DIAGNOSIS — Z8546 Personal history of malignant neoplasm of prostate: Secondary | ICD-10-CM | POA: Diagnosis not present

## 2016-09-25 DIAGNOSIS — C61 Malignant neoplasm of prostate: Secondary | ICD-10-CM

## 2016-09-25 DIAGNOSIS — I10 Essential (primary) hypertension: Secondary | ICD-10-CM | POA: Diagnosis not present

## 2016-09-25 DIAGNOSIS — F330 Major depressive disorder, recurrent, mild: Secondary | ICD-10-CM | POA: Diagnosis not present

## 2016-09-25 DIAGNOSIS — N400 Enlarged prostate without lower urinary tract symptoms: Secondary | ICD-10-CM | POA: Diagnosis not present

## 2016-10-08 ENCOUNTER — Other Ambulatory Visit: Payer: Self-pay | Admitting: Urology

## 2016-10-08 DIAGNOSIS — C61 Malignant neoplasm of prostate: Secondary | ICD-10-CM

## 2016-10-11 DIAGNOSIS — N400 Enlarged prostate without lower urinary tract symptoms: Secondary | ICD-10-CM | POA: Diagnosis not present

## 2016-10-11 DIAGNOSIS — Z0000 Encounter for general adult medical examination without abnormal findings: Secondary | ICD-10-CM | POA: Diagnosis not present

## 2016-10-11 DIAGNOSIS — F330 Major depressive disorder, recurrent, mild: Secondary | ICD-10-CM | POA: Diagnosis not present

## 2016-10-11 DIAGNOSIS — E782 Mixed hyperlipidemia: Secondary | ICD-10-CM | POA: Diagnosis not present

## 2016-10-11 DIAGNOSIS — Z8546 Personal history of malignant neoplasm of prostate: Secondary | ICD-10-CM | POA: Diagnosis not present

## 2016-10-11 DIAGNOSIS — I10 Essential (primary) hypertension: Secondary | ICD-10-CM | POA: Diagnosis not present

## 2016-10-30 ENCOUNTER — Ambulatory Visit (INDEPENDENT_AMBULATORY_CARE_PROVIDER_SITE_OTHER): Payer: Medicare Other | Admitting: Internal Medicine

## 2016-10-31 ENCOUNTER — Other Ambulatory Visit (HOSPITAL_COMMUNITY): Payer: Medicare Other

## 2016-11-02 DIAGNOSIS — R197 Diarrhea, unspecified: Secondary | ICD-10-CM | POA: Diagnosis not present

## 2016-11-07 ENCOUNTER — Encounter (HOSPITAL_COMMUNITY): Payer: Self-pay

## 2016-11-07 ENCOUNTER — Ambulatory Visit (HOSPITAL_COMMUNITY): Payer: Medicare Other

## 2016-11-07 DIAGNOSIS — C61 Malignant neoplasm of prostate: Secondary | ICD-10-CM | POA: Diagnosis not present

## 2016-11-12 ENCOUNTER — Ambulatory Visit (INDEPENDENT_AMBULATORY_CARE_PROVIDER_SITE_OTHER): Payer: Medicare Other | Admitting: Urology

## 2016-11-12 DIAGNOSIS — R9721 Rising PSA following treatment for malignant neoplasm of prostate: Secondary | ICD-10-CM

## 2016-11-12 DIAGNOSIS — C61 Malignant neoplasm of prostate: Secondary | ICD-10-CM

## 2016-11-15 ENCOUNTER — Encounter (HOSPITAL_COMMUNITY)
Admission: RE | Admit: 2016-11-15 | Discharge: 2016-11-15 | Disposition: A | Discharge status: Home / Self Care | Payer: Medicare Other | Source: Ambulatory Visit | Attending: Urology | Admitting: Urology

## 2016-11-15 ENCOUNTER — Encounter (HOSPITAL_COMMUNITY): Payer: Self-pay

## 2016-11-15 DIAGNOSIS — C61 Malignant neoplasm of prostate: Secondary | ICD-10-CM | POA: Insufficient documentation

## 2016-11-15 MED ORDER — TECHNETIUM TC 99M MEDRONATE IV KIT
20.0000 | PACK | Freq: Once | INTRAVENOUS | Status: AC | PRN
  Administered 2016-11-15: 21 via INTRAVENOUS

## 2016-11-22 ENCOUNTER — Ambulatory Visit (HOSPITAL_COMMUNITY)
Admission: RE | Admit: 2016-11-22 | Discharge: 2016-11-22 | Disposition: A | Discharge status: Home / Self Care | Payer: Medicare Other | Source: Ambulatory Visit | Attending: Urology | Admitting: Urology

## 2016-11-22 DIAGNOSIS — K8020 Calculus of gallbladder without cholecystitis without obstruction: Secondary | ICD-10-CM | POA: Diagnosis not present

## 2016-11-22 DIAGNOSIS — C61 Malignant neoplasm of prostate: Secondary | ICD-10-CM | POA: Diagnosis not present

## 2016-11-22 DIAGNOSIS — R918 Other nonspecific abnormal finding of lung field: Secondary | ICD-10-CM | POA: Diagnosis not present

## 2016-11-22 DIAGNOSIS — I700 Atherosclerosis of aorta: Secondary | ICD-10-CM | POA: Insufficient documentation

## 2016-11-22 DIAGNOSIS — I2510 Atherosclerotic heart disease of native coronary artery without angina pectoris: Secondary | ICD-10-CM | POA: Diagnosis not present

## 2016-11-22 DIAGNOSIS — K5730 Diverticulosis of large intestine without perforation or abscess without bleeding: Secondary | ICD-10-CM | POA: Insufficient documentation

## 2016-11-22 LAB — POCT I-STAT CREATININE: CREATININE: 1.1 mg/dL (ref 0.61–1.24)

## 2016-11-22 MED ORDER — IOPAMIDOL (ISOVUE-300) INJECTION 61%
100.0000 mL | Freq: Once | INTRAVENOUS | Status: AC | PRN
  Administered 2016-11-22: 100 mL via INTRAVENOUS

## 2017-02-04 DIAGNOSIS — C61 Malignant neoplasm of prostate: Secondary | ICD-10-CM | POA: Diagnosis not present

## 2017-02-11 ENCOUNTER — Ambulatory Visit (INDEPENDENT_AMBULATORY_CARE_PROVIDER_SITE_OTHER): Payer: Medicare Other | Admitting: Urology

## 2017-02-11 DIAGNOSIS — C61 Malignant neoplasm of prostate: Secondary | ICD-10-CM | POA: Diagnosis not present

## 2017-02-11 DIAGNOSIS — R351 Nocturia: Secondary | ICD-10-CM

## 2017-02-11 DIAGNOSIS — N3281 Overactive bladder: Secondary | ICD-10-CM

## 2017-02-11 DIAGNOSIS — R9721 Rising PSA following treatment for malignant neoplasm of prostate: Secondary | ICD-10-CM

## 2017-03-08 DIAGNOSIS — I10 Essential (primary) hypertension: Secondary | ICD-10-CM | POA: Diagnosis not present

## 2017-03-08 DIAGNOSIS — F330 Major depressive disorder, recurrent, mild: Secondary | ICD-10-CM | POA: Diagnosis not present

## 2017-03-12 DIAGNOSIS — N400 Enlarged prostate without lower urinary tract symptoms: Secondary | ICD-10-CM | POA: Diagnosis not present

## 2017-03-12 DIAGNOSIS — I10 Essential (primary) hypertension: Secondary | ICD-10-CM | POA: Diagnosis not present

## 2017-03-14 DIAGNOSIS — F330 Major depressive disorder, recurrent, mild: Secondary | ICD-10-CM | POA: Diagnosis not present

## 2017-03-14 DIAGNOSIS — E782 Mixed hyperlipidemia: Secondary | ICD-10-CM | POA: Diagnosis not present

## 2017-03-14 DIAGNOSIS — Z8546 Personal history of malignant neoplasm of prostate: Secondary | ICD-10-CM | POA: Diagnosis not present

## 2017-03-14 DIAGNOSIS — I10 Essential (primary) hypertension: Secondary | ICD-10-CM | POA: Diagnosis not present

## 2017-03-17 ENCOUNTER — Other Ambulatory Visit (HOSPITAL_COMMUNITY): Payer: Self-pay | Admitting: Internal Medicine

## 2017-03-17 ENCOUNTER — Ambulatory Visit (HOSPITAL_COMMUNITY)
Admission: RE | Admit: 2017-03-17 | Discharge: 2017-03-17 | Disposition: A | Discharge status: Home / Self Care | Payer: Medicare Other | Source: Ambulatory Visit | Attending: Internal Medicine | Admitting: Internal Medicine

## 2017-03-17 DIAGNOSIS — R52 Pain, unspecified: Secondary | ICD-10-CM | POA: Insufficient documentation

## 2017-03-17 DIAGNOSIS — S2242XA Multiple fractures of ribs, left side, initial encounter for closed fracture: Secondary | ICD-10-CM | POA: Diagnosis not present

## 2017-03-18 DIAGNOSIS — R071 Chest pain on breathing: Secondary | ICD-10-CM | POA: Diagnosis not present

## 2017-03-18 DIAGNOSIS — Z6829 Body mass index (BMI) 29.0-29.9, adult: Secondary | ICD-10-CM | POA: Diagnosis not present

## 2017-03-24 DIAGNOSIS — Z6829 Body mass index (BMI) 29.0-29.9, adult: Secondary | ICD-10-CM | POA: Diagnosis not present

## 2017-03-24 DIAGNOSIS — S2249XS Multiple fractures of ribs, unspecified side, sequela: Secondary | ICD-10-CM | POA: Diagnosis not present

## 2017-05-16 DIAGNOSIS — I10 Essential (primary) hypertension: Secondary | ICD-10-CM | POA: Diagnosis not present

## 2017-05-23 DIAGNOSIS — I10 Essential (primary) hypertension: Secondary | ICD-10-CM | POA: Diagnosis not present

## 2017-07-07 DIAGNOSIS — C61 Malignant neoplasm of prostate: Secondary | ICD-10-CM | POA: Diagnosis not present

## 2017-07-15 ENCOUNTER — Ambulatory Visit (INDEPENDENT_AMBULATORY_CARE_PROVIDER_SITE_OTHER): Payer: Medicare Other | Admitting: Urology

## 2017-07-15 DIAGNOSIS — R9721 Rising PSA following treatment for malignant neoplasm of prostate: Secondary | ICD-10-CM

## 2017-07-15 DIAGNOSIS — R3913 Splitting of urinary stream: Secondary | ICD-10-CM | POA: Diagnosis not present

## 2017-07-15 DIAGNOSIS — C61 Malignant neoplasm of prostate: Secondary | ICD-10-CM

## 2017-07-15 DIAGNOSIS — N401 Enlarged prostate with lower urinary tract symptoms: Secondary | ICD-10-CM | POA: Diagnosis not present

## 2017-07-29 DIAGNOSIS — Z6830 Body mass index (BMI) 30.0-30.9, adult: Secondary | ICD-10-CM | POA: Diagnosis not present

## 2017-07-29 DIAGNOSIS — R109 Unspecified abdominal pain: Secondary | ICD-10-CM | POA: Diagnosis not present

## 2017-08-12 DIAGNOSIS — R109 Unspecified abdominal pain: Secondary | ICD-10-CM | POA: Diagnosis not present

## 2017-08-12 DIAGNOSIS — N400 Enlarged prostate without lower urinary tract symptoms: Secondary | ICD-10-CM | POA: Diagnosis not present

## 2017-08-12 DIAGNOSIS — Z6830 Body mass index (BMI) 30.0-30.9, adult: Secondary | ICD-10-CM | POA: Diagnosis not present

## 2017-08-12 DIAGNOSIS — F419 Anxiety disorder, unspecified: Secondary | ICD-10-CM | POA: Diagnosis not present

## 2017-09-18 DIAGNOSIS — H25813 Combined forms of age-related cataract, bilateral: Secondary | ICD-10-CM | POA: Diagnosis not present

## 2017-09-18 DIAGNOSIS — H31001 Unspecified chorioretinal scars, right eye: Secondary | ICD-10-CM | POA: Diagnosis not present

## 2017-10-06 DIAGNOSIS — C61 Malignant neoplasm of prostate: Secondary | ICD-10-CM | POA: Diagnosis not present

## 2017-10-14 ENCOUNTER — Ambulatory Visit (INDEPENDENT_AMBULATORY_CARE_PROVIDER_SITE_OTHER): Payer: Medicare Other | Admitting: Urology

## 2017-10-14 DIAGNOSIS — C61 Malignant neoplasm of prostate: Secondary | ICD-10-CM | POA: Diagnosis not present

## 2017-10-14 DIAGNOSIS — N401 Enlarged prostate with lower urinary tract symptoms: Secondary | ICD-10-CM

## 2017-10-14 DIAGNOSIS — R3915 Urgency of urination: Secondary | ICD-10-CM

## 2017-10-14 DIAGNOSIS — R9721 Rising PSA following treatment for malignant neoplasm of prostate: Secondary | ICD-10-CM

## 2017-10-15 ENCOUNTER — Other Ambulatory Visit: Payer: Self-pay | Admitting: Urology

## 2017-10-15 DIAGNOSIS — C61 Malignant neoplasm of prostate: Secondary | ICD-10-CM

## 2017-10-21 DIAGNOSIS — C61 Malignant neoplasm of prostate: Secondary | ICD-10-CM | POA: Diagnosis not present

## 2017-11-04 ENCOUNTER — Ambulatory Visit (HOSPITAL_COMMUNITY)
Admission: RE | Admit: 2017-11-04 | Discharge: 2017-11-04 | Disposition: A | Discharge status: Home / Self Care | Payer: Medicare Other | Source: Ambulatory Visit | Attending: Urology | Admitting: Urology

## 2017-11-04 DIAGNOSIS — I120 Hypertensive chronic kidney disease with stage 5 chronic kidney disease or end stage renal disease: Secondary | ICD-10-CM | POA: Diagnosis not present

## 2017-11-04 DIAGNOSIS — N179 Acute kidney failure, unspecified: Secondary | ICD-10-CM | POA: Diagnosis not present

## 2017-11-04 DIAGNOSIS — N400 Enlarged prostate without lower urinary tract symptoms: Secondary | ICD-10-CM

## 2017-11-04 DIAGNOSIS — N323 Diverticulum of bladder: Secondary | ICD-10-CM | POA: Insufficient documentation

## 2017-11-04 DIAGNOSIS — K5730 Diverticulosis of large intestine without perforation or abscess without bleeding: Secondary | ICD-10-CM

## 2017-11-04 DIAGNOSIS — K625 Hemorrhage of anus and rectum: Secondary | ICD-10-CM | POA: Diagnosis not present

## 2017-11-04 DIAGNOSIS — Z09 Encounter for follow-up examination after completed treatment for conditions other than malignant neoplasm: Secondary | ICD-10-CM

## 2017-11-04 DIAGNOSIS — K8020 Calculus of gallbladder without cholecystitis without obstruction: Secondary | ICD-10-CM | POA: Insufficient documentation

## 2017-11-04 DIAGNOSIS — Z8546 Personal history of malignant neoplasm of prostate: Secondary | ICD-10-CM

## 2017-11-04 DIAGNOSIS — E785 Hyperlipidemia, unspecified: Secondary | ICD-10-CM | POA: Diagnosis not present

## 2017-11-04 DIAGNOSIS — N200 Calculus of kidney: Secondary | ICD-10-CM

## 2017-11-04 DIAGNOSIS — D72829 Elevated white blood cell count, unspecified: Secondary | ICD-10-CM | POA: Diagnosis not present

## 2017-11-04 DIAGNOSIS — C61 Malignant neoplasm of prostate: Secondary | ICD-10-CM

## 2017-11-04 DIAGNOSIS — N189 Chronic kidney disease, unspecified: Secondary | ICD-10-CM | POA: Diagnosis not present

## 2017-11-04 DIAGNOSIS — K5733 Diverticulitis of large intestine without perforation or abscess with bleeding: Secondary | ICD-10-CM | POA: Diagnosis not present

## 2017-11-04 DIAGNOSIS — A09 Infectious gastroenteritis and colitis, unspecified: Secondary | ICD-10-CM | POA: Diagnosis not present

## 2017-11-04 DIAGNOSIS — I10 Essential (primary) hypertension: Secondary | ICD-10-CM | POA: Diagnosis not present

## 2017-11-04 LAB — POCT I-STAT CREATININE: CREATININE: 1.1 mg/dL (ref 0.61–1.24)

## 2017-11-04 MED ORDER — IOPAMIDOL (ISOVUE-300) INJECTION 61%
100.0000 mL | Freq: Once | INTRAVENOUS | Status: AC | PRN
  Administered 2017-11-04: 100 mL via INTRAVENOUS

## 2017-11-05 ENCOUNTER — Emergency Department (HOSPITAL_COMMUNITY)
Admission: EM | Admit: 2017-11-05 | Discharge: 2017-11-06 | Disposition: A | Discharge status: Home / Self Care | Payer: Medicare Other | Source: Home / Self Care | Attending: Emergency Medicine | Admitting: Emergency Medicine

## 2017-11-05 ENCOUNTER — Encounter (HOSPITAL_COMMUNITY): Payer: Self-pay

## 2017-11-05 DIAGNOSIS — K625 Hemorrhage of anus and rectum: Secondary | ICD-10-CM | POA: Insufficient documentation

## 2017-11-05 DIAGNOSIS — Z79899 Other long term (current) drug therapy: Secondary | ICD-10-CM | POA: Insufficient documentation

## 2017-11-05 DIAGNOSIS — Z8546 Personal history of malignant neoplasm of prostate: Secondary | ICD-10-CM

## 2017-11-05 DIAGNOSIS — I10 Essential (primary) hypertension: Secondary | ICD-10-CM

## 2017-11-05 DIAGNOSIS — Z7982 Long term (current) use of aspirin: Secondary | ICD-10-CM

## 2017-11-05 NOTE — ED Triage Notes (Signed)
Patient states that he has rectal bleeding, started around 4 hours ago.  States that he has the bleeding in the past that was much worse.

## 2017-11-06 ENCOUNTER — Other Ambulatory Visit: Payer: Self-pay

## 2017-11-06 ENCOUNTER — Encounter (HOSPITAL_COMMUNITY): Payer: Self-pay | Admitting: Emergency Medicine

## 2017-11-06 ENCOUNTER — Inpatient Hospital Stay (HOSPITAL_COMMUNITY)
Admission: EM | Admit: 2017-11-06 | Discharge: 2017-11-08 | DRG: 378 | Disposition: A | Discharge status: Home / Self Care | Payer: Medicare Other | Attending: Internal Medicine | Admitting: Internal Medicine

## 2017-11-06 ENCOUNTER — Inpatient Hospital Stay (HOSPITAL_COMMUNITY): Payer: Medicare Other

## 2017-11-06 DIAGNOSIS — F329 Major depressive disorder, single episode, unspecified: Secondary | ICD-10-CM | POA: Diagnosis present

## 2017-11-06 DIAGNOSIS — Z8639 Personal history of other endocrine, nutritional and metabolic disease: Secondary | ICD-10-CM | POA: Diagnosis present

## 2017-11-06 DIAGNOSIS — E860 Dehydration: Secondary | ICD-10-CM | POA: Diagnosis present

## 2017-11-06 DIAGNOSIS — E785 Hyperlipidemia, unspecified: Secondary | ICD-10-CM | POA: Diagnosis present

## 2017-11-06 DIAGNOSIS — E876 Hypokalemia: Secondary | ICD-10-CM | POA: Diagnosis present

## 2017-11-06 DIAGNOSIS — I120 Hypertensive chronic kidney disease with stage 5 chronic kidney disease or end stage renal disease: Secondary | ICD-10-CM | POA: Diagnosis not present

## 2017-11-06 DIAGNOSIS — H918X2 Other specified hearing loss, left ear: Secondary | ICD-10-CM | POA: Diagnosis present

## 2017-11-06 DIAGNOSIS — I10 Essential (primary) hypertension: Secondary | ICD-10-CM | POA: Diagnosis present

## 2017-11-06 DIAGNOSIS — D649 Anemia, unspecified: Secondary | ICD-10-CM | POA: Diagnosis not present

## 2017-11-06 DIAGNOSIS — R109 Unspecified abdominal pain: Secondary | ICD-10-CM | POA: Diagnosis present

## 2017-11-06 DIAGNOSIS — N179 Acute kidney failure, unspecified: Secondary | ICD-10-CM | POA: Diagnosis present

## 2017-11-06 DIAGNOSIS — R739 Hyperglycemia, unspecified: Secondary | ICD-10-CM | POA: Diagnosis present

## 2017-11-06 DIAGNOSIS — K5733 Diverticulitis of large intestine without perforation or abscess with bleeding: Principal | ICD-10-CM | POA: Diagnosis present

## 2017-11-06 DIAGNOSIS — Z7982 Long term (current) use of aspirin: Secondary | ICD-10-CM | POA: Diagnosis not present

## 2017-11-06 DIAGNOSIS — E7800 Pure hypercholesterolemia, unspecified: Secondary | ICD-10-CM | POA: Diagnosis present

## 2017-11-06 DIAGNOSIS — Z79899 Other long term (current) drug therapy: Secondary | ICD-10-CM

## 2017-11-06 DIAGNOSIS — N400 Enlarged prostate without lower urinary tract symptoms: Secondary | ICD-10-CM | POA: Diagnosis not present

## 2017-11-06 DIAGNOSIS — E1165 Type 2 diabetes mellitus with hyperglycemia: Secondary | ICD-10-CM | POA: Diagnosis present

## 2017-11-06 DIAGNOSIS — A09 Infectious gastroenteritis and colitis, unspecified: Secondary | ICD-10-CM | POA: Diagnosis not present

## 2017-11-06 DIAGNOSIS — D72829 Elevated white blood cell count, unspecified: Secondary | ICD-10-CM | POA: Diagnosis not present

## 2017-11-06 DIAGNOSIS — F419 Anxiety disorder, unspecified: Secondary | ICD-10-CM | POA: Diagnosis present

## 2017-11-06 DIAGNOSIS — K219 Gastro-esophageal reflux disease without esophagitis: Secondary | ICD-10-CM | POA: Diagnosis present

## 2017-11-06 DIAGNOSIS — Z8546 Personal history of malignant neoplasm of prostate: Secondary | ICD-10-CM

## 2017-11-06 DIAGNOSIS — N200 Calculus of kidney: Secondary | ICD-10-CM | POA: Diagnosis not present

## 2017-11-06 DIAGNOSIS — T50995A Adverse effect of other drugs, medicaments and biological substances, initial encounter: Secondary | ICD-10-CM | POA: Diagnosis present

## 2017-11-06 DIAGNOSIS — K529 Noninfective gastroenteritis and colitis, unspecified: Secondary | ICD-10-CM

## 2017-11-06 DIAGNOSIS — Y92009 Unspecified place in unspecified non-institutional (private) residence as the place of occurrence of the external cause: Secondary | ICD-10-CM | POA: Diagnosis not present

## 2017-11-06 DIAGNOSIS — Z8249 Family history of ischemic heart disease and other diseases of the circulatory system: Secondary | ICD-10-CM

## 2017-11-06 DIAGNOSIS — N289 Disorder of kidney and ureter, unspecified: Secondary | ICD-10-CM

## 2017-11-06 DIAGNOSIS — R1030 Lower abdominal pain, unspecified: Secondary | ICD-10-CM | POA: Diagnosis not present

## 2017-11-06 DIAGNOSIS — N189 Chronic kidney disease, unspecified: Secondary | ICD-10-CM | POA: Diagnosis not present

## 2017-11-06 DIAGNOSIS — D62 Acute posthemorrhagic anemia: Secondary | ICD-10-CM

## 2017-11-06 DIAGNOSIS — Z8711 Personal history of peptic ulcer disease: Secondary | ICD-10-CM

## 2017-11-06 DIAGNOSIS — K625 Hemorrhage of anus and rectum: Secondary | ICD-10-CM | POA: Diagnosis not present

## 2017-11-06 HISTORY — DX: Peptic ulcer, site unspecified, unspecified as acute or chronic, without hemorrhage or perforation: K27.9

## 2017-11-06 LAB — COMPREHENSIVE METABOLIC PANEL
ALBUMIN: 3.7 g/dL (ref 3.5–5.0)
ALT: 21 U/L (ref 17–63)
ANION GAP: 12 (ref 5–15)
AST: 28 U/L (ref 15–41)
Alkaline Phosphatase: 63 U/L (ref 38–126)
BILIRUBIN TOTAL: 0.9 mg/dL (ref 0.3–1.2)
BUN: 18 mg/dL (ref 6–20)
CHLORIDE: 99 mmol/L — AB (ref 101–111)
CO2: 29 mmol/L (ref 22–32)
Calcium: 9.3 mg/dL (ref 8.9–10.3)
Creatinine, Ser: 1.31 mg/dL — ABNORMAL HIGH (ref 0.61–1.24)
GFR calc Af Amer: 56 mL/min — ABNORMAL LOW (ref >60)
GFR, EST NON AFRICAN AMERICAN: 48 mL/min — AB (ref >60)
Glucose, Bld: 175 mg/dL — ABNORMAL HIGH (ref 65–99)
POTASSIUM: 3.4 mmol/L — AB (ref 3.5–5.1)
Sodium: 140 mmol/L (ref 135–145)
TOTAL PROTEIN: 6.6 g/dL (ref 6.5–8.1)

## 2017-11-06 LAB — CBC WITH DIFFERENTIAL/PLATELET
BASOS PCT: 0 %
Basophils Absolute: 0 10*3/uL (ref 0.0–0.1)
EOS ABS: 0 10*3/uL (ref 0.0–0.7)
EOS PCT: 0 %
HCT: 44.7 % (ref 39.0–52.0)
HEMOGLOBIN: 14.9 g/dL (ref 13.0–17.0)
Lymphocytes Relative: 6 %
Lymphs Abs: 1 10*3/uL (ref 0.7–4.0)
MCH: 27.9 pg (ref 26.0–34.0)
MCHC: 33.3 g/dL (ref 30.0–36.0)
MCV: 83.7 fL (ref 78.0–100.0)
Monocytes Absolute: 1.1 10*3/uL (ref 0.1–1.0)
Monocytes Relative: 6 %
NEUTROS PCT: 88 %
Neutro Abs: 16.2 10*3/uL (ref 1.7–7.7)
PLATELETS: 197 10*3/uL (ref 150–400)
RBC: 5.34 MIL/uL (ref 4.22–5.81)
RDW: 14.2 % (ref 11.5–15.5)
WBC: 18.3 10*3/uL — AB (ref 4.0–10.5)

## 2017-11-06 LAB — CBC
HEMATOCRIT: 43.9 % (ref 39.0–52.0)
HEMOGLOBIN: 14.7 g/dL (ref 13.0–17.0)
MCH: 27.7 pg (ref 26.0–34.0)
MCHC: 33.5 g/dL (ref 30.0–36.0)
MCV: 82.7 fL (ref 78.0–100.0)
Platelets: 209 10*3/uL (ref 150–400)
RBC: 5.31 MIL/uL (ref 4.22–5.81)
RDW: 14.2 % (ref 11.5–15.5)
WBC: 19.6 10*3/uL — AB (ref 4.0–10.5)

## 2017-11-06 LAB — BASIC METABOLIC PANEL
Anion gap: 12 (ref 5–15)
BUN: 19 mg/dL (ref 6–20)
CALCIUM: 9.2 mg/dL (ref 8.9–10.3)
CO2: 28 mmol/L (ref 22–32)
CREATININE: 1.6 mg/dL — AB (ref 0.61–1.24)
Chloride: 101 mmol/L (ref 101–111)
GFR, EST AFRICAN AMERICAN: 44 mL/min — AB (ref >60)
GFR, EST NON AFRICAN AMERICAN: 38 mL/min — AB (ref >60)
Glucose, Bld: 218 mg/dL — ABNORMAL HIGH (ref 65–99)
Potassium: 3.3 mmol/L — ABNORMAL LOW (ref 3.5–5.1)
SODIUM: 141 mmol/L (ref 135–145)

## 2017-11-06 LAB — PROTIME-INR
INR: 1.12
PROTHROMBIN TIME: 14.3 s (ref 11.4–15.2)

## 2017-11-06 LAB — C DIFFICILE QUICK SCREEN W PCR REFLEX
C DIFFICLE (CDIFF) ANTIGEN: NEGATIVE
C Diff interpretation: NOT DETECTED
C Diff toxin: NEGATIVE

## 2017-11-06 LAB — TYPE AND SCREEN
ABO/RH(D): O POS
ANTIBODY SCREEN: NEGATIVE

## 2017-11-06 LAB — POC OCCULT BLOOD, ED: FECAL OCCULT BLD: POSITIVE — AB

## 2017-11-06 MED ORDER — POTASSIUM CHLORIDE IN NACL 20-0.9 MEQ/L-% IV SOLN
INTRAVENOUS | Status: AC
  Administered 2017-11-06: 22:00:00 via INTRAVENOUS

## 2017-11-06 MED ORDER — METRONIDAZOLE IN NACL 5-0.79 MG/ML-% IV SOLN
500.0000 mg | Freq: Three times a day (TID) | INTRAVENOUS | Status: DC
  Administered 2017-11-06 – 2017-11-08 (×5): 500 mg via INTRAVENOUS
  Filled 2017-11-06 (×5): qty 100

## 2017-11-06 MED ORDER — ALFUZOSIN HCL ER 10 MG PO TB24
10.0000 mg | ORAL_TABLET | Freq: Every day | ORAL | Status: DC
  Administered 2017-11-07 – 2017-11-08 (×2): 10 mg via ORAL
  Filled 2017-11-06 (×4): qty 1

## 2017-11-06 MED ORDER — POTASSIUM CHLORIDE CRYS ER 20 MEQ PO TBCR
20.0000 meq | EXTENDED_RELEASE_TABLET | Freq: Two times a day (BID) | ORAL | Status: DC
  Administered 2017-11-06 – 2017-11-08 (×4): 20 meq via ORAL
  Filled 2017-11-06 (×4): qty 1

## 2017-11-06 MED ORDER — POTASSIUM CHLORIDE CRYS ER 20 MEQ PO TBCR
40.0000 meq | EXTENDED_RELEASE_TABLET | Freq: Once | ORAL | Status: AC
  Administered 2017-11-06: 40 meq via ORAL
  Filled 2017-11-06: qty 2

## 2017-11-06 MED ORDER — SODIUM CHLORIDE 0.9 % IV SOLN
INTRAVENOUS | Status: DC
Start: 2017-11-06 — End: 2017-11-06

## 2017-11-06 MED ORDER — SACCHAROMYCES BOULARDII 250 MG PO CAPS
250.0000 mg | ORAL_CAPSULE | Freq: Two times a day (BID) | ORAL | Status: DC
  Administered 2017-11-06 – 2017-11-08 (×4): 250 mg via ORAL
  Filled 2017-11-06 (×4): qty 1

## 2017-11-06 MED ORDER — MELATONIN 3 MG PO TABS
9.0000 mg | ORAL_TABLET | Freq: Every day | ORAL | Status: DC
  Administered 2017-11-07: 9 mg via ORAL
  Filled 2017-11-06 (×3): qty 3

## 2017-11-06 MED ORDER — FAMOTIDINE IN NACL 20-0.9 MG/50ML-% IV SOLN
20.0000 mg | Freq: Once | INTRAVENOUS | Status: AC
  Administered 2017-11-06: 20 mg via INTRAVENOUS
  Filled 2017-11-06: qty 50

## 2017-11-06 MED ORDER — VENLAFAXINE HCL ER 75 MG PO CP24
75.0000 mg | ORAL_CAPSULE | Freq: Every day | ORAL | Status: DC
Start: 2017-11-07 — End: 2017-11-08
  Administered 2017-11-07 – 2017-11-08 (×2): 75 mg via ORAL
  Filled 2017-11-06 (×2): qty 1

## 2017-11-06 MED ORDER — SIMVASTATIN 10 MG PO TABS
10.0000 mg | ORAL_TABLET | Freq: Every day | ORAL | Status: DC
  Administered 2017-11-06 – 2017-11-07 (×2): 10 mg via ORAL
  Filled 2017-11-06 (×2): qty 1

## 2017-11-06 MED ORDER — SODIUM CHLORIDE 0.9 % IV BOLUS
1000.0000 mL | Freq: Once | INTRAVENOUS | Status: AC
  Administered 2017-11-06: 1000 mL via INTRAVENOUS

## 2017-11-06 MED ORDER — LORAZEPAM 1 MG PO TABS
1.0000 mg | ORAL_TABLET | Freq: Two times a day (BID) | ORAL | Status: DC | PRN
  Administered 2017-11-07: 1 mg via ORAL
  Filled 2017-11-06: qty 1

## 2017-11-06 MED ORDER — FAMOTIDINE IN NACL 20-0.9 MG/50ML-% IV SOLN
20.0000 mg | Freq: Two times a day (BID) | INTRAVENOUS | Status: DC
  Filled 2017-11-06: qty 50

## 2017-11-06 MED ORDER — MELATONIN 5 MG PO TABS
10.0000 mg | ORAL_TABLET | Freq: Every day | ORAL | Status: DC
  Filled 2017-11-06: qty 2

## 2017-11-06 MED ORDER — SODIUM CHLORIDE 0.9 % IV BOLUS
500.0000 mL | Freq: Once | INTRAVENOUS | Status: AC
  Administered 2017-11-06: 500 mL via INTRAVENOUS

## 2017-11-06 MED ORDER — ACETAMINOPHEN 650 MG RE SUPP: 650.0000 mg | Freq: Four times a day (QID) | RECTAL | Status: DC | PRN

## 2017-11-06 MED ORDER — ACETAMINOPHEN 325 MG PO TABS
650.0000 mg | ORAL_TABLET | Freq: Four times a day (QID) | ORAL | Status: DC | PRN
Start: 2017-11-06 — End: 2017-11-08
  Administered 2017-11-07: 650 mg via ORAL
  Filled 2017-11-06: qty 2

## 2017-11-06 MED ORDER — CIPROFLOXACIN IN D5W 400 MG/200ML IV SOLN
400.0000 mg | Freq: Two times a day (BID) | INTRAVENOUS | Status: DC
  Administered 2017-11-06 – 2017-11-08 (×4): 400 mg via INTRAVENOUS
  Filled 2017-11-06 (×4): qty 200

## 2017-11-06 MED ORDER — VERAPAMIL HCL ER 240 MG PO TBCR
240.0000 mg | EXTENDED_RELEASE_TABLET | Freq: Two times a day (BID) | ORAL | Status: DC
  Administered 2017-11-06 – 2017-11-08 (×4): 240 mg via ORAL
  Filled 2017-11-06 (×4): qty 1

## 2017-11-06 NOTE — Discharge Instructions (Addendum)
Return to the emergency department if you develop worsening bleeding, dizziness or lightheadedness, severe abdominal pain, high fever, or other new and concerning symptoms.

## 2017-11-06 NOTE — H&P (Signed)
[facility] H&P   Patient Demographics:    Jeffrey Sherman, is a 84 y.o. male  MRN: [id number]   DOB - 11-25-1929  Admit Date - 11/06/2017  Outpatient Primary MD for the patient is Celene Squibb, MD  Referring MD/NP/PA:  Angelina Ok  Outpatient Specialists:  Dr. Laural Golden  Patient coming from: home  Chief Complaint  Patient presents with  . Rectal Bleeding      HPI:    Jeffrey Sherman  is a 84 y.o. male, w hypertension, hyperlipidemia, prostate cancer, pud, gerd, diverticulosis c/o rectal bleeding on toilet paper and in toilet bowel starting yesterday.  Had 2 episodes this am.  Pt notes  Bilateral lower abd pain, starting yesterday as well as diarrhea, .  Pt denies fever, chills, heartburn.    In ED,  Na 140 K 3.4 Bun 18, Creatinine 1.31 Ast 28, Alt 21   Wbc 19.6, Hgb 14.7, Plt 209  FOBT positive  CT scan abd / pelvis pending  Pt will be admitted for rectal bleeding, diarrhea, and lower abdominal pain.       Review of systems:    In addition to the HPI above,  No Fever-chills, No Headache, No changes with Vision or hearing, No problems swallowing food or Liquids, No Chest pain, Cough or Shortness of Breath,  No Blood in  Urine, No dysuria, No new skin rashes or bruises, No new joints pains-aches,  No new weakness, tingling, numbness in any extremity, No recent weight gain or loss, No polyuria, polydypsia or polyphagia, No significant Mental Stressors.  A full 10 point Review of Systems was done, except as stated above, all other Review of Systems were negative.   With Past History of the following :    Past Medical History:  Diagnosis Date  . Anxiety   . Cancer [facility])    prostate  . Chronic chest wall pain   . Essential hypertension, benign 06/20/2016  . GERD (gastroesophageal reflux disease)   . H/O echocardiogram 2016   normal  . Heart murmur    "leaking heart valve"  . High cholesterol 06/20/2016  . HOH (hard of hearing)    left  . Hypertension   . Poor historian   . PUD (peptic ulcer disease)   . Shortness of breath       Past Surgical History:  Procedure Laterality Date  . ESOPHAGOGASTRODUODENOSCOPY    . ESOPHAGOGASTRODUODENOSCOPY N/A 07/18/2016   Procedure: ESOPHAGOGASTRODUODENOSCOPY (EGD);  Surgeon: Rogene Houston, MD;  Location: [facility] ENDO SUITE;  Service: Endoscopy;  Laterality: N/A;  3:00-moved to 230 per Lelon Frohlich  . ORIF WRIST FRACTURE  12/19/2011   Procedure: OPEN REDUCTION INTERNAL FIXATION (ORIF) WRIST FRACTURE;  Surgeon: Carole Civil, MD;  Location: [facility] ORS;  Service: Orthopedics;  Laterality: Right;  . prostate cancer     Diagnosed in the 90s  . TONSILLECTOMY  Social History:     Social History   Tobacco Use  . Smoking status: Never Smoker  . Smokeless tobacco: Never Used  Substance Use Topics  . Alcohol use: No     Lives - at home  Mobility - walks by self   Family History :     Family History  Problem Relation Age of Onset  . Cancer Unknown   . Heart attack Mother   . Cancer Brother   . Heart attack Brother   . Heart disease Sister        by pass       Home Medications:   Prior to Admission medications   Medication Sig Start Date End Date Taking? Authorizing Provider  alfuzosin (UROXATRAL) 10 MG 24 hr tablet Take 10 mg by mouth daily with breakfast.    [provider]  aspirin EC 81 MG tablet Take 81 mg by mouth every morning.    [provider]  calcium carbonate (TUMS - DOSED IN MG ELEMENTAL CALCIUM) 500 MG chewable tablet Chew 1 tablet by mouth 2 (two) times daily as needed for indigestion or heartburn.     [provider]  cholecalciferol (VITAMIN D) 1000 units tablet Take 1,000 Units by mouth daily.    [provider]  diphenhydramine-acetaminophen (TYLENOL PM) 25-500 MG TABS tablet Take 1 tablet by mouth at bedtime.     [provider]  Flax Oil-Fish Oil-Borage Oil (FISH OIL-FLAX OIL-BORAGE OIL) CAPS Take 1 capsule by mouth daily.    [provider]  hydrochlorothiazide (HYDRODIURIL) 25 MG tablet Take 25 mg by mouth daily.    [provider]  lisinopril (PRINIVIL,ZESTRIL) 40 MG tablet Take 40 mg by mouth 2 (two) times daily.    [provider]  LORazepam (ATIVAN) 1 MG tablet Take 1 mg by mouth 2 (two) times daily as needed for anxiety.     [provider]  Melatonin 10 MG TABS Take 10 mg by mouth at bedtime.    [provider]  omeprazole (PRILOSEC) 20 MG capsule Take 20 mg by mouth 2 (two) times daily. 10/23/17   [provider]  potassium chloride SA (K-DUR,KLOR-CON) 20 MEQ tablet Take 20 mEq by mouth 2 (two) times daily.    [provider]  simvastatin (ZOCOR) 10 MG tablet Take 10 mg by mouth at bedtime.     [provider]  terazosin (HYTRIN) 5 MG capsule Take 5 mg by mouth 2 (two) times daily. 09/14/17   [provider]  venlafaxine XR (EFFEXOR-XR) 75 MG 24 hr capsule Take 75 mg by mouth daily.    [provider]  verapamil (CALAN-SR) 240 MG CR tablet Take 240 mg by mouth 2 (two) times daily.    [provider]  vitamin C (ASCORBIC ACID) 500 MG tablet Take 1,000 mg by mouth daily.    [provider]     Allergies:    No Known Allergies   Physical Exam:   Vitals  Blood pressure (!) 151/77, pulse 97, temperature 98.4 F (36.9 C), temperature source Oral, resp. rate 18, height 5\' 9"  (1.753 m), weight 90.7 kg (200 lb), SpO2 96 %.   1. General  lying in bed in NAD,   2. Normal affect and insight, Not Suicidal or Homicidal, Awake Alert, Oriented X 3.  3. No F.N deficits, ALL C.Nerves Intact, Strength 5/5 all 4 extremities, Sensation intact all 4 extremities, Plantars down going.  4. Ears and Eyes appear Normal,  Conjunctivae clear, PERRLA. Moist Oral Mucosa.  5. Supple Neck, No JVD, No cervical  lymphadenopathy appriciated, No Carotid Bruits.  6. Symmetrical Chest wall movement, Good air movement bilaterally, CTAB.  7. RRR, No Gallops, Rubs or Murmurs, No Parasternal Heave.  8. Positive Bowel Sounds, Abdomen Soft, No tenderness, No organomegaly appriciated,No rebound -guarding or rigidity.  9.  No Cyanosis, Normal Skin Turgor, No Skin Rash or Bruise.  10. Good muscle tone,  joints appear normal , no effusions, Normal ROM.  11. No Palpable Lymph Nodes in Neck or Axillae      Data Review:    CBC Recent Labs  Lab 11/05/17 2218 11/06/17 1445  WBC 18.3* 19.6*  HGB 14.9 14.7  HCT 44.7 43.9  PLT 197 209  MCV 83.7 82.7  MCH 27.9 27.7  MCHC 33.3 33.5  RDW 14.2 14.2  LYMPHSABS 1.0  --   MONOABS 1.1  --   EOSABS 0.0  --   BASOSABS 0.0  --    ------------------------------------------------------------------------------------------------------------------  Chemistries  Recent Labs  Lab 11/04/17 0915 11/05/17 2218 11/06/17 1445  NA  --  141 140  K  --  3.3* 3.4*  CL  --  101 99*  CO2  --  28 29  GLUCOSE  --  218* 175*  BUN  --  19 18  CREATININE 1.10 1.60* 1.31*  CALCIUM  --  9.2 9.3  AST  --   --  28  ALT  --   --  21  ALKPHOS  --   --  63  BILITOT  --   --  0.9   ------------------------------------------------------------------------------------------------------------------ estimated creatinine clearance is 45.9 mL/min (A) (by C-G formula based on SCr of 1.31 mg/dL (H)). ------------------------------------------------------------------------------------------------------------------ No results for input(s): TSH, T4TOTAL, T3FREE, THYROIDAB in the last 72 hours.  Invalid input(s): FREET3  Coagulation profile Recent Labs  Lab 11/05/17 2218  INR 1.12   ------------------------------------------------------------------------------------------------------------------- No results for input(s): DDIMER in the last 72  hours. -------------------------------------------------------------------------------------------------------------------  Cardiac Enzymes No results for input(s): CKMB, TROPONINI, MYOGLOBIN in the last 168 hours.  Invalid input(s): CK ------------------------------------------------------------------------------------------------------------------ No results found for: BNP   ---------------------------------------------------------------------------------------------------------------  Urinalysis    Component Value Date/Time   COLORURINE YELLOW 05/08/2014 2045   APPEARANCEUR CLEAR 05/08/2014 2045   LABSPEC >1.030 (H) 05/08/2014 2045   PHURINE 7.0 05/08/2014 2045   GLUCOSEU NEGATIVE 05/08/2014 2045   HGBUR MODERATE (A) 05/08/2014 2045   BILIRUBINUR NEGATIVE 05/08/2014 2045   KETONESUR NEGATIVE 05/08/2014 2045   PROTEINUR 100 (A) 05/08/2014 2045   UROBILINOGEN 0.2 05/08/2014 2045   NITRITE NEGATIVE 05/08/2014 2045   LEUKOCYTESUR NEGATIVE 05/08/2014 2045    ----------------------------------------------------------------------------------------------------------------   Imaging Results:    No results found.     Assessment & Plan:    Active Problems:   Rectal bleeding    Rectal bleeding NPO except for medications STOP aspirin Pepcid 20mg  iv bid Please consult GI in am  Diarrhea Check stool for GI pathogen panel Check stool for C. Diff  Lower abdominal pain Check CT abd / pelvis r/o diverticulitis Empirically start abx below Start Cipro 400mg  iv bid Start Flagyl 500mg  iv tid  Hypertension Cont Verapamil 240mg  po qday HOLD  Lisinopril HOLD  Hydrochlorothiazide  Hyperlipidemia Cont simvastatin 10mg  po qhs  Renal insufficiency HOLD lisinopril HOLD hydrochlorothiazide CT scan as above r/o hydronephrosis Hydrate with ns iv  Anxiety Cont Effexor Cont lorazepam prn  Bph Cont Terazosin 5mg  po bid      DVT Prophylaxis  SCDs  AM Labs Ordered,  also please review Full Orders  Family Communication: Admission, patients condition and plan of care including tests being ordered have been discussed with the patient  who indicate understanding and agree with the plan and Code Status.  Code Status FULL CODE  Likely DC to home  Condition GUARDED    Consults called:  Please call GI in am  Admission status: inpatient  Time spent in minutes : 45   Jani Gravel M.D on 11/06/2017 at 5:45 PM  Between 7am to 7pm - Pager - [phone]  . After 7pm go to [email or web address] - password [facility]  [facility] - Office  [phone]

## 2017-11-06 NOTE — ED Provider Notes (Signed)
[facility] EMERGENCY DEPARTMENT Provider Note   CSN: [id number] Arrival date & time: 11/06/17  1421     History   Chief Complaint Chief Complaint  Patient presents with  . Rectal Bleeding    HPI Jeffrey WELCHER is a 84 y.o. male.Patient complains of rectal bleeding since yesterday af pain. He is pain-free at present. He was seen here yesterday told to return if he had worsening bleeding. Other associated symptoms includelightheadedness . He admits to some nausea, no vomitting No other associated symptoms.   HPI  Past Medical History:  Diagnosis Date  . Anxiety   . Cancer [facility])    prostate  . Chronic chest wall pain   . Essential hypertension, benign 06/20/2016  . GERD (gastroesophageal reflux disease)   . H/O echocardiogram 2016   normal  . Heart murmur    "leaking heart valve"  . High cholesterol 06/20/2016  . HOH (hard of hearing)    left  . Hypertension   . Poor historian   . Shortness of breath   bleeding ulcers  Patient Active Problem List   Diagnosis Date Noted  . Essential hypertension, benign 06/20/2016  . High cholesterol 06/20/2016  . Gastroesophageal reflux disease without esophagitis 06/20/2016  . Dyspnea 12/17/2013  . Constipation 12/16/2011  . Wrist fracture 12/16/2011    Past Surgical History:  Procedure Laterality Date  . ESOPHAGOGASTRODUODENOSCOPY    . ESOPHAGOGASTRODUODENOSCOPY N/A 07/18/2016   Procedure: ESOPHAGOGASTRODUODENOSCOPY (EGD);  Surgeon: Rogene Houston, MD;  Location: [facility] ENDO [facility]ITE;  Service: Endoscopy;  Laterality: N/A;  3:00-moved to 230 per Lelon Frohlich  . ORIF WRIST FRACTURE  12/19/2011   Procedure: OPEN REDUCTION INTERNAL FIXATION (ORIF) WRIST FRACTURE;  Surgeon: Carole Civil, MD;  Location: [facility] ORS;  Service: Orthopedics;  Laterality: Right;  . prostate cancer     Diagnosed in the 90s  . TONSILLECTOMY          Home Medications    Prior to Admission medications   Medication Sig Start Date End Date Taking? Authorizing  Provider  alfuzosin (UROXATRAL) 10 MG 24 hr tablet Take 10 mg by mouth daily with breakfast.    [provider]  aspirin EC 81 MG tablet Take 81 mg by mouth every morning.    [provider]  calcium carbonate (TUMS - DOSED IN MG ELEMENTAL CALCIUM) 500 MG chewable tablet Chew 1 tablet by mouth 2 (two) times daily as needed for indigestion or heartburn.     [provider]  cholecalciferol (VITAMIN D) 1000 units tablet Take 1,000 Units by mouth daily.    [provider]  diphenhydramine-acetaminophen (TYLENOL PM) 25-500 MG TABS tablet Take 1 tablet by mouth at bedtime.     [provider]  Flax Oil-Fish Oil-Borage Oil (FISH OIL-FLAX OIL-BORAGE OIL) CAPS Take 1 capsule by mouth daily.    [provider]  hydrochlorothiazide (HYDRODIURIL) 25 MG tablet Take 25 mg by mouth daily.    [provider]  lisinopril (PRINIVIL,ZESTRIL) 40 MG tablet Take 40 mg by mouth 2 (two) times daily.    [provider]  LORazepam (ATIVAN) 1 MG tablet Take 1 mg by mouth 2 (two) times daily as needed for anxiety.     [provider]  Melatonin 10 MG TABS Take 10 mg by mouth at bedtime.    [provider]  omeprazole (PRILOSEC) 20 MG capsule Take 20 mg by mouth 2 (two) times daily. 10/23/17   [provider]  potassium chloride SA (K-DUR,KLOR-CON)  20 MEQ tablet Take 20 mEq by mouth 2 (two) times daily.    [provider]  simvastatin (ZOCOR) 10 MG tablet Take 10 mg by mouth at bedtime.     [provider]  terazosin (HYTRIN) 5 MG capsule Take 5 mg by mouth 2 (two) times daily. 09/14/17   [provider]  venlafaxine XR (EFFEXOR-XR) 75 MG 24 hr capsule Take 75 mg by mouth daily.    [provider]  verapamil (CALAN-SR) 240 MG CR tablet Take 240 mg by mouth 2 (two) times daily.    [provider]  vitamin C (ASCORBIC ACID) 500 MG tablet Take 1,000 mg by mouth daily.    [provider]    Family History Family History  Problem Relation Age of Onset  . Cancer Unknown   . Heart attack Mother   . Cancer Brother   . Heart attack Brother   . Heart disease Sister        by pass    Social History Social History   Tobacco Use  . Smoking status: Never Smoker  . Smokeless tobacco: Never Used  Substance Use Topics  . Alcohol use: No  . Drug use: No     Allergies   Patient has no known allergies.   Review of Systems Review of Systems  Constitutional: Negative.   HENT: Negative.   Respiratory: Negative.   Cardiovascular: Negative.   Gastrointestinal: Positive for abdominal pain, blood in stool and nausea.  Musculoskeletal: Negative.   Skin: Negative.   Allergic/Immunologic: Positive for immunocompromised state.       Cancer patient  Neurological: Positive for light-headedness.  Psychiatric/Behavioral: Negative.   All other systems reviewed and are negative.    Physical Exam Updated Vital Signs BP (!) 151/77 (BP Location: Left Arm)   Pulse 97   Temp 98.4 F (36.9 C) (Oral)   Resp 18   Ht 5\' 9"  (1.753 m)   Wt 90.7 kg (200 lb)   SpO2 96%   BMI 29.53 kg/m   Physical Exam  Constitutional: He appears well-developed and well-nourished. No distress.  HENT:  Head: Normocephalic and atraumatic.  Eyes: Pupils are equal, round, and reactive to light. Conjunctivae are normal.  Neck: Neck supple. No tracheal deviation present. No thyromegaly present.  Cardiovascular: Normal rate and regular rhythm.  No murmur heard. Pulmonary/Chest: Effort normal and breath sounds normal.  Abdominal: Soft. Bowel sounds are normal. He exhibits no distension. There is tenderness.  Mildly tender over lower quadrants  bilaterally  Genitourinary: Penis normal.  Genitourinary Comments: Rectal normal tone, grossly bloody stool  Musculoskeletal: Normal range of motion. He exhibits no edema or tenderness.  Neurological: He is alert. Coordination normal.  Skin:  Skin is warm and dry. No rash noted.  Psychiatric: He has a normal mood and affect.  Nursing note and vitals reviewed.    ED Treatments / Results  Labs (all labs ordered are listed, but only abnormal results are displayed) Labs Reviewed  COMPREHENSIVE METABOLIC PANEL - Abnormal; Notable for the following components:      Result Value   Potassium 3.4 (*)    Chloride 99 (*)    Glucose, Bld 175 (*)    Creatinine, Ser 1.31 (*)    GFR calc non Af Amer 48 (*)    GFR calc Af Amer 56 (*)    All other components within normal limits  CBC - Abnormal; Notable for the following components:   WBC 19.6 (*)  All other components within normal limits  POC OCCULT BLOOD, ED - Abnormal; Notable for the following components:   Fecal Occult Bld POSITIVE (*)    All other components within normal limits  TYPE AND SCREEN   Results for orders placed or performed during the hospital encounter of 11/06/17  Comprehensive metabolic panel  Result Value Ref Range   Sodium 140 135 - 145 mmol/L   Potassium 3.4 (L) 3.5 - 5.1 mmol/L   Chloride 99 (L) 101 - 111 mmol/L   CO2 29 22 - 32 mmol/L   Glucose, Bld 175 (H) 65 - 99 mg/dL   BUN 18 6 - 20 mg/dL   Creatinine, Ser 1.31 (H) 0.61 - 1.24 mg/dL   Calcium 9.3 8.9 - 10.3 mg/dL   Total Protein 6.6 6.5 - 8.1 g/dL   Albumin 3.7 3.5 - 5.0 g/dL   AST 28 15 - 41 U/L   ALT 21 17 - 63 U/L   Alkaline Phosphatase 63 38 - 126 U/L   Total Bilirubin 0.9 0.3 - 1.2 mg/dL   GFR calc non Af Amer 48 (L) >60 mL/min   GFR calc Af Amer 56 (L) >60 mL/min   Anion gap 12 5 - 15  CBC  Result Value Ref Range   WBC 19.6 (H) 4.0 - 10.5 K/uL   RBC 5.31 4.22 - 5.81 MIL/uL   Hemoglobin 14.7 13.0 - 17.0 g/dL   HCT 43.9 39.0 - 52.0 %   MCV 82.7 78.0 - 100.0 fL   MCH 27.7 26.0 - 34.0 pg   MCHC 33.5 30.0 - 36.0 g/dL   RDW 14.2 11.5 - 15.5 %   Platelets 209 150 - 400 K/uL  POC occult blood, ED  Result Value Ref Range   Fecal Occult Bld POSITIVE (A) NEGATIVE  Type and screen  [facility]  Result Value Ref Range   ABO/RH(D) O POS    Antibody Screen NEG    Sample Expiration      11/09/2017 Performed at [facility], [address] [place], [place] [address]    Ct Abdomen Pelvis W Contrast  Result Date: 11/04/2017 CLINICAL DATA:  Follow-up prostate carcinoma. EXAM: CT ABDOMEN AND PELVIS WITH CONTRAST TECHNIQUE: Multidetector CT imaging of the abdomen and pelvis was performed using the standard protocol following bolus administration of intravenous contrast. CONTRAST:  110mL ISOVUE-300 IOPAMIDOL (ISOVUE-300) INJECTION 61% COMPARISON:  11/22/2016 FINDINGS: Lower Chest: No acute findings. Hepatobiliary: Small cyst in inferior left hepatic lobe remains stable. A tiny sub-cm hypervascular focus in the anterior left hepatic lobe remains stable or decreased in size since previous study. No new or enlarging liver masses identified. Several tiny calcified gallstones are seen, however there is no evidence of cholecystitis or biliary ductal dilatation. Pancreas:  No mass or inflammatory changes. Spleen: Within normal limits in size and appearance. Adrenals/Urinary Tract: Stable 3 mm nonobstructing calculus in lower pole of right kidney. No evidence of ureteral calculi or hydronephrosis. Bilateral renal cysts show no significant change, and there is no evidence of renal mass or hydronephrosis. Several small bladder diverticula noted. Stomach/Bowel: No evidence of obstruction, inflammatory process or abnormal fluid collections. Diverticulosis again seen involving the descending and sigmoid colon, without evidence of diverticulitis. Vascular/Lymphatic: No pathologically enlarged lymph nodes. No abdominal aortic aneurysm. Aortic atherosclerosis. Retroaortic left renal vein incidentally noted. Reproductive: Stable mildly enlarged prostate gland with mass effect on bladder base. Symmetric seminal vesicles. Other:  None. Musculoskeletal:  No suspicious bone lesions identified. IMPRESSION:  Stable mildly  enlarged prostate gland. No evidence of recurrent or metastatic disease. Stable small bladder diverticula. Colonic diverticulosis, without radiographic evidence of diverticulitis. Stable tiny nonobstructing right renal calculus. Cholelithiasis.  No radiographic evidence of cholecystitis. Electronically Signed   By: Earle Gell M.D.   On: 11/04/2017 10:13    EKG None  Radiology No results found.  Procedures Procedures (including critical care time)  Medications Ordered in ED Medications  sodium chloride 0.9 % bolus 1,000 mL (has no administration in time range)     Initial Impression / Assessment and Plan / ED Course  I have reviewed the triage vital signs and the nursing notes.  Pertinent labs & imaging results that were available during my care of the patient were reviewed by me and considered in my medical decision making (see chart for details).     Case discussed with Dr. Oneida Alar, gastrologistby telephone. There is noted gastroenterology coverage this weekend at [facility]. Optionsinclude23 hour observation under hospitalist service and serial hemoglobins, if patient remains stablehe could presumably go home for out Patient colonoscopy next week. Another option is to transfer pt to [facility], where he could get GI consultation this weekend.patient elects to stay at [facility] consulted hospitalist.lab work remarkable for leukocytosis.hemoglobin remained stable over yesterday.lab work also remarkable for mild hypokalemi, improved over yesterday. Dr. Oneida Alar recommends Anusol cream 4 times daily.  Oral potassium supplementation ordered. . Pepcid ordered by me. I've consulted Dr. Maudie Mercury who willarrange for overnight stay Final Clinical Impressions(s) / ED Diagnoses  Diagnosis #1 rectal bleeding #2lower abdominal pain #3 leukocytosis Final diagnoses:  None  #4 hypokalemia #5 renal insufficiency  ED Discharge Orders    None         Orlie Dakin, MD 11/06/17 1747

## 2017-11-06 NOTE — ED Triage Notes (Signed)
Pt here last night for rectal bleeding Here today with daughter who shares pictures of blood in toilet States pain is 8 and bleeding is worse

## 2017-11-06 NOTE — ED Provider Notes (Signed)
Jeffrey Sherman [facility] EMERGENCY DEPARTMENT Provider Note   CSN: [id number] Arrival date & time: 11/05/17  2135     History   Chief Complaint Chief Complaint  Patient presents with  . Rectal Bleeding    HPI Jeffrey Sherman is a 84 y.o. male.  Patient is an 84 year old male with past medical history of hypertension, prostate cancer, high cholesterol.  He presents for evaluation of rectal bleeding.  He states that he had several episodes of passing bright red blood per rectum this evening.  He denies any pain.  He denies any hemorrhoid.  He denies any fevers or chills.  The history is provided by the patient.  Rectal Bleeding  Quality:  Bright red Amount:  Moderate Duration:  2 hours Timing:  Constant Chronicity:  New Relieved by:  Nothing Worsened by:  Nothing Ineffective treatments:  None tried   Past Medical History:  Diagnosis Date  . Anxiety   . Cancer [facility])    prostate  . Chronic chest wall pain   . Essential hypertension, benign 06/20/2016  . GERD (gastroesophageal reflux disease)   . H/O echocardiogram 2016   normal  . Heart murmur    "leaking heart valve"  . High cholesterol 06/20/2016  . HOH (hard of hearing)    left  . Hypertension   . Poor historian   . Shortness of breath     Patient Active Problem List   Diagnosis Date Noted  . Essential hypertension, benign 06/20/2016  . High cholesterol 06/20/2016  . Gastroesophageal reflux disease without esophagitis 06/20/2016  . Dyspnea 12/17/2013  . Constipation 12/16/2011  . Wrist fracture 12/16/2011    Past Surgical History:  Procedure Laterality Date  . ESOPHAGOGASTRODUODENOSCOPY    . ESOPHAGOGASTRODUODENOSCOPY N/A 07/18/2016   Procedure: ESOPHAGOGASTRODUODENOSCOPY (EGD);  Surgeon: Jeffrey Sherman;  Location: [facility] ENDO [facility];  Service: Endoscopy;  Laterality: N/A;  3:00-moved to 230 per Lelon Frohlich  . ORIF WRIST FRACTURE  12/19/2011   Procedure: OPEN REDUCTION INTERNAL FIXATION (ORIF) WRIST FRACTURE;  Surgeon:  Jeffrey Sherman;  Location: [facility] ORS;  Service: Orthopedics;  Laterality: Right;  . prostate cancer     Diagnosed in the 90s  . TONSILLECTOMY          Home Medications    Prior to Admission medications   Medication Sig Start Date End Date Taking? Authorizing Provider  alfuzosin (UROXATRAL) 10 MG 24 hr tablet Take 10 mg by mouth daily with breakfast.   Yes Provider, Historical, Sherman  aspirin EC 81 MG tablet Take 81 mg by mouth every morning.   Yes Provider, Historical, Sherman  calcium carbonate (TUMS - DOSED IN MG ELEMENTAL CALCIUM) 500 MG chewable tablet Chew 1 tablet by mouth 2 (two) times daily as needed for indigestion or heartburn.    Yes Provider, Historical, Sherman  cholecalciferol (VITAMIN D) 1000 units tablet Take 1,000 Units by mouth daily.   Yes Provider, Historical, Sherman  diphenhydramine-acetaminophen (TYLENOL PM) 25-500 MG TABS tablet Take 1 tablet by mouth at bedtime.    Yes Provider, Historical, Sherman  Flax Oil-Fish Oil-Borage Oil (FISH OIL-FLAX OIL-BORAGE OIL) CAPS Take 1 capsule by mouth daily.   Yes Provider, Historical, Sherman  hydrochlorothiazide (HYDRODIURIL) 25 MG tablet Take 25 mg by mouth daily.   Yes Provider, Historical, Sherman  lisinopril (PRINIVIL,ZESTRIL) 40 MG tablet Take 40 mg by mouth 2 (two) times daily.   Yes Provider, Historical, Sherman  LORazepam (ATIVAN) 1 MG tablet Take 1 mg by mouth 2 (two) times  daily as needed for anxiety.    Yes Provider, Historical, Sherman  Melatonin 10 MG TABS Take 10 mg by mouth at bedtime.   Yes Provider, Historical, Sherman  omeprazole (PRILOSEC) 20 MG capsule Take 20 mg by mouth 2 (two) times daily. 10/23/17  Yes Provider, Historical, Sherman  potassium chloride SA (K-DUR,KLOR-CON) 20 MEQ tablet Take 20 mEq by mouth 2 (two) times daily.   Yes Provider, Historical, Sherman  simvastatin (ZOCOR) 10 MG tablet Take 10 mg by mouth at bedtime.    Yes Provider, Historical, Sherman  terazosin (HYTRIN) 5 MG capsule Take 5 mg by mouth 2 (two) times daily. 09/14/17  Yes Provider,  Historical, Sherman  venlafaxine XR (EFFEXOR-XR) 75 MG 24 hr capsule Take 75 mg by mouth daily.   Yes Provider, Historical, Sherman  verapamil (CALAN-SR) 240 MG CR tablet Take 240 mg by mouth 2 (two) times daily.   Yes Provider, Historical, Sherman  vitamin C (ASCORBIC ACID) 500 MG tablet Take 1,000 mg by mouth daily.   Yes Provider, Historical, Sherman    Family History Family History  Problem Relation Age of Onset  . Cancer Unknown   . Heart attack Mother   . Cancer Brother   . Heart attack Brother   . Heart disease Sister        by pass    Social History Social History   Tobacco Use  . Smoking status: Never Smoker  . Smokeless tobacco: Never Used  Substance Use Topics  . Alcohol use: No  . Drug use: No     Allergies   Patient has no known allergies.   Review of Systems Review of Systems  Gastrointestinal: Positive for hematochezia.  All other systems reviewed and are negative.    Physical Exam Updated Vital Signs BP (!) 187/95   Pulse 89   Temp 98 F (36.7 C) (Oral)   Resp 16   Ht 5\' 9"  (1.753 m)   Wt 90.7 kg (200 lb)   SpO2 94%   BMI 29.53 kg/m   Physical Exam  Constitutional: He is oriented to person, place, and time. He appears well-developed and well-nourished. No distress.  HENT:  Head: Normocephalic and atraumatic.  Mouth/Throat: Oropharynx is clear and moist.  Neck: Normal range of motion. Neck supple.  Cardiovascular: Normal rate and regular rhythm. Exam reveals no friction rub.  No murmur heard. Pulmonary/Chest: Effort normal and breath sounds normal. No respiratory distress. He has no wheezes. He has no rales.  Abdominal: Soft. Bowel sounds are normal. He exhibits no distension. There is no tenderness.  Genitourinary: Rectum normal.  Genitourinary Comments: There are no obvious hemorrhoids or fissures visible.  There is no gross blood noted on DRE.  Musculoskeletal: Normal range of motion. He exhibits no edema.  Neurological: He is alert and oriented to  person, place, and time. Coordination normal.  Skin: Skin is warm and dry. He is not diaphoretic.  Nursing note and vitals reviewed.    ED Treatments / Results  Labs (all labs ordered are listed, but only abnormal results are displayed) Labs Reviewed  BASIC METABOLIC PANEL  CBC WITH DIFFERENTIAL/PLATELET  PROTIME-INR    EKG None  Radiology Ct Abdomen Pelvis W Contrast  Result Date: 11/04/2017 CLINICAL DATA:  Follow-up prostate carcinoma. EXAM: CT ABDOMEN AND PELVIS WITH CONTRAST TECHNIQUE: Multidetector CT imaging of the abdomen and pelvis was performed using the standard protocol following bolus administration of intravenous contrast. CONTRAST:  175mL ISOVUE-300 IOPAMIDOL (ISOVUE-300) INJECTION 61% COMPARISON:  11/22/2016 FINDINGS:  Lower Chest: No acute findings. Hepatobiliary: Small cyst in inferior left hepatic lobe remains stable. A tiny sub-cm hypervascular focus in the anterior left hepatic lobe remains stable or decreased in size since previous study. No new or enlarging liver masses identified. Several tiny calcified gallstones are seen, however there is no evidence of cholecystitis or biliary ductal dilatation. Pancreas:  No mass or inflammatory changes. Spleen: Within normal limits in size and appearance. Adrenals/Urinary Tract: Stable 3 mm nonobstructing calculus in lower pole of right kidney. No evidence of ureteral calculi or hydronephrosis. Bilateral renal cysts show no significant change, and there is no evidence of renal mass or hydronephrosis. Several small bladder diverticula noted. Stomach/Bowel: No evidence of obstruction, inflammatory process or abnormal fluid collections. Diverticulosis again seen involving the descending and sigmoid colon, without evidence of diverticulitis. Vascular/Lymphatic: No pathologically enlarged lymph nodes. No abdominal aortic aneurysm. Aortic atherosclerosis. Retroaortic left renal vein incidentally noted. Reproductive: Stable mildly enlarged  prostate gland with mass effect on bladder base. Symmetric seminal vesicles. Other:  None. Musculoskeletal:  No suspicious bone lesions identified. IMPRESSION: Stable mildly enlarged prostate gland. No evidence of recurrent or metastatic disease. Stable small bladder diverticula. Colonic diverticulosis, without radiographic evidence of diverticulitis. Stable tiny nonobstructing right renal calculus. Cholelithiasis.  No radiographic evidence of cholecystitis. Electronically Signed   By: Earle Gell M.D.   On: 11/04/2017 10:13    Procedures Procedures (including critical care time)  Medications Ordered in ED Medications  sodium chloride 0.9 % bolus 500 mL (has no administration in time range)     Initial Impression / Assessment and Plan / ED Course  I have reviewed the triage vital signs and the nursing notes.  Pertinent labs & imaging results that were available during my care of the patient were reviewed by me and considered in my medical decision making (see chart for details).  Patient presents here after several episodes of rectal bleeding.  He reports passing bright red blood.  He has had no dizziness, chest pain, or shortness of breath.  He is remained hemodynamically stable while in the emergency department.  His hemoglobin is 14.9.  He does have a white count of 18,000, the significance of which I am uncertain.  He is complaining of no abdominal pain and his abdominal exam is benign.  He underwent a CT scan yesterday as an outpatient for follow-up of his prostate cancer.  This revealed diverticulosis with no diverticulitis.  With the benign abdominal exam, I see no indication to repeat this.  The patient has had no further bleeding and is insistent upon going home.  He does not want to stay in the hospital and his family at bedside states they will watch him and bring him back if he experiences additional problems.  At this point, I feel as though he is appropriate for discharge with strict  return precautions.  Final Clinical Impressions(s) / ED Diagnoses   Final diagnoses:  None    ED Discharge Orders    None       Veryl Speak, Sherman 11/06/17 [phone]

## 2017-11-06 NOTE — Progress Notes (Signed)
Pharmacy Antibiotic Note  Jeffrey Sherman is a 84 y.o. male admitted on 11/06/2017 with intr-abdominal infection.Marland Kitchen  Pharmacy has been consulted for ciprofloxacin dosing.  Plan: Start ciprofloxacin 400mg  IV q12h. Pharmacy will continue to monitor labs, cultures and patient  progress.  Height: 5\' 9"  (175.3 cm) Weight: 200 lb (90.7 kg) IBW/kg (Calculated) : 70.7  Temp (24hrs), Avg:98.2 F (36.8 C), Min:98 F (36.7 C), Max:98.4 F (36.9 C)  Recent Labs  Lab 11/04/17 0915 11/05/17 2218 11/06/17 1445  WBC  --  18.3* 19.6*  CREATININE 1.10 1.60* 1.31*    Estimated Creatinine Clearance: 45.9 mL/min (A) (by C-G formula based on SCr of 1.31 mg/dL (H)).    No Known Allergies  Antimicrobials this admission: 5/30 Cipro>>   Dose adjustments this admission: n/a  Microbiology results: c. diff screen in progress  BCx:   UCx:   Sputum:   MRSA PCR:   Thank you for allowing pharmacy to be a part of this patient's care.  Despina Pole 11/06/2017 9:12 PM

## 2017-11-07 ENCOUNTER — Telehealth: Payer: Self-pay | Admitting: Gastroenterology

## 2017-11-07 DIAGNOSIS — D62 Acute posthemorrhagic anemia: Secondary | ICD-10-CM

## 2017-11-07 DIAGNOSIS — K625 Hemorrhage of anus and rectum: Secondary | ICD-10-CM

## 2017-11-07 DIAGNOSIS — R1030 Lower abdominal pain, unspecified: Secondary | ICD-10-CM

## 2017-11-07 DIAGNOSIS — N400 Enlarged prostate without lower urinary tract symptoms: Secondary | ICD-10-CM

## 2017-11-07 DIAGNOSIS — D72829 Elevated white blood cell count, unspecified: Secondary | ICD-10-CM

## 2017-11-07 DIAGNOSIS — I10 Essential (primary) hypertension: Secondary | ICD-10-CM

## 2017-11-07 DIAGNOSIS — N289 Disorder of kidney and ureter, unspecified: Secondary | ICD-10-CM

## 2017-11-07 DIAGNOSIS — A09 Infectious gastroenteritis and colitis, unspecified: Secondary | ICD-10-CM

## 2017-11-07 DIAGNOSIS — E876 Hypokalemia: Secondary | ICD-10-CM

## 2017-11-07 DIAGNOSIS — D649 Anemia, unspecified: Secondary | ICD-10-CM

## 2017-11-07 DIAGNOSIS — K529 Noninfective gastroenteritis and colitis, unspecified: Secondary | ICD-10-CM

## 2017-11-07 LAB — COMPREHENSIVE METABOLIC PANEL
ALT: 18 U/L (ref 17–63)
AST: 26 U/L (ref 15–41)
Albumin: 3.1 g/dL — ABNORMAL LOW (ref 3.5–5.0)
Alkaline Phosphatase: 49 U/L (ref 38–126)
Anion gap: 6 (ref 5–15)
BUN: 19 mg/dL (ref 6–20)
CHLORIDE: 105 mmol/L (ref 101–111)
CO2: 27 mmol/L (ref 22–32)
CREATININE: 1.24 mg/dL (ref 0.61–1.24)
Calcium: 8.8 mg/dL — ABNORMAL LOW (ref 8.9–10.3)
GFR, EST AFRICAN AMERICAN: 59 mL/min — AB (ref >60)
GFR, EST NON AFRICAN AMERICAN: 51 mL/min — AB (ref >60)
Glucose, Bld: 147 mg/dL — ABNORMAL HIGH (ref 65–99)
Potassium: 3.4 mmol/L — ABNORMAL LOW (ref 3.5–5.1)
Sodium: 138 mmol/L (ref 135–145)
Total Bilirubin: 0.9 mg/dL (ref 0.3–1.2)
Total Protein: 5.5 g/dL — ABNORMAL LOW (ref 6.5–8.1)

## 2017-11-07 LAB — GASTROINTESTINAL PANEL BY PCR, STOOL (REPLACES STOOL CULTURE)
ADENOVIRUS F40/41: NOT DETECTED
Astrovirus: NOT DETECTED
Campylobacter species: NOT DETECTED
Cryptosporidium: NOT DETECTED
Cyclospora cayetanensis: NOT DETECTED
ENTEROAGGREGATIVE E COLI (EAEC): NOT DETECTED
ENTEROPATHOGENIC E COLI (EPEC): NOT DETECTED
Entamoeba histolytica: NOT DETECTED
Enterotoxigenic E coli (ETEC): NOT DETECTED
GIARDIA LAMBLIA: NOT DETECTED
NOROVIRUS GI/GII: NOT DETECTED
Plesimonas shigelloides: NOT DETECTED
ROTAVIRUS A: NOT DETECTED
SHIGELLA/ENTEROINVASIVE E COLI (EIEC): NOT DETECTED
Salmonella species: NOT DETECTED
Sapovirus (I, II, IV, and V): NOT DETECTED
Shiga like toxin producing E coli (STEC): NOT DETECTED
Vibrio cholerae: NOT DETECTED
Vibrio species: NOT DETECTED
Yersinia enterocolitica: NOT DETECTED

## 2017-11-07 LAB — HEMOGLOBIN A1C
Hgb A1c MFr Bld: 5.5 % (ref 4.8–5.6)
MEAN PLASMA GLUCOSE: 111.15 mg/dL

## 2017-11-07 LAB — CBC
HCT: 39 % (ref 39.0–52.0)
Hemoglobin: 12.6 g/dL — ABNORMAL LOW (ref 13.0–17.0)
MCH: 27 pg (ref 26.0–34.0)
MCHC: 32.3 g/dL (ref 30.0–36.0)
MCV: 83.5 fL (ref 78.0–100.0)
PLATELETS: 174 10*3/uL (ref 150–400)
RBC: 4.67 MIL/uL (ref 4.22–5.81)
RDW: 14.4 % (ref 11.5–15.5)
WBC: 18.2 10*3/uL — AB (ref 4.0–10.5)

## 2017-11-07 MED ORDER — PANTOPRAZOLE SODIUM 40 MG PO TBEC
40.0000 mg | DELAYED_RELEASE_TABLET | Freq: Every day | ORAL | Status: DC
  Administered 2017-11-07 – 2017-11-08 (×2): 40 mg via ORAL
  Filled 2017-11-07 (×2): qty 1

## 2017-11-07 NOTE — Care Management Important Message (Signed)
Important Message  Patient Details  Name: Jeffrey Sherman MRN: [id number] Date of Birth: [birth year]   Medicare Important Message Given:  Yes    Shelda Altes 11/07/2017, 12:37 PM

## 2017-11-07 NOTE — Consult Note (Signed)
Referring Provider: [facility] Primary Care Physician:  Celene Squibb, MD Primary Gastroenterologist:  Dr. Laural Golden  Date of Admission: 11/06/17 Date of Consultation: 11/07/17  Reason for Consultation:  Colitis, GI bleed  HPI:  Jeffrey Sherman is a 84 y.o. male with a past medical history of anxiety, prostate cancer, GERD, hypertension, peptic ulcer disease.  He presented to the emergency department complaining of rectal bleeding on toilet paper and in the toilet bowl which began the day prior to presentation.  2 episodes yesterday morning.  Notes bilateral lower abdominal pain which started yesterday as well as diarrhea.  In the ED his white blood cell count was 19.6, hemoglobin 14.7. His iFOBT was positive.  CT of the abdomen and pelvis found diffuse wall thickening along the distal transverse, descending, sigmoid colon with surrounding soft tissue inflammation compatible with infectious versus inflammatory colitis.  There is also a question of a 1.7 cm nonspecific hypodensity in the right hepatic lobe, liver otherwise unremarkable and recommended correlation with liver enzymes.  CMP found normal liver enzymes.  The patient was admitted and started on antibiotics for presumed infectious colitis.  C. difficile was negative, GI pathogen panel is still pending.  CMP this morning with mild hypokalemia (potassium 3.4), liver enzymes remain normal.  CBC found a mild decrease in his white blood cell count from 19.6 to 18.2 on antibiotics.  His hemoglobin decreased from 14.7 to 12.6.  He is n.p.o.  Last colonoscopy in our system completed 07/27/2003 which found no endoscopic evidence of colitis, sigmoid colon diverticulosis and moderate-sized external hemorrhoids, few focal areas of mucosal erythema felt to be nonspecific.  Today he is accompanied by his family including his son and daughter-in-law at the bedside.  Today he states he began having abdominal pain a couple few days ago.  This is  accompanied with significant GI bleeding.  He is also felt ill.  His abdominal pain continued through last night and this morning.  He is having lower abdominal pain bilaterally today.  He states his last bowel movement was 7:00 this morning and there was blood in air as well, although he feels it is decreased in amount.  Denies nausea and vomiting.  He does have some lightheadedness and weakness.  Denies chest pain, shortness of breath, syncope.  Denies any history of inflammatory bowel diseases.  Confirms to date and findings of his last colonoscopy.  He notes that he was in the emergency department the day before the visit that led to this admission and he was discharged home.  They did come back, and subsequently were admitted, due to worsening symptoms.  No other GI complaints.  He is asking to eat.  Past Medical History:  Diagnosis Date  . Anxiety   . Cancer [facility])    prostate  . Chronic chest wall pain   . Essential hypertension, benign 06/20/2016  . GERD (gastroesophageal reflux disease)   . H/O echocardiogram 2016   normal  . Heart murmur    "leaking heart valve"  . High cholesterol 06/20/2016  . HOH (hard of hearing)    left  . Hypertension   . Poor historian   . PUD (peptic ulcer disease)   . Shortness of breath     Past Surgical History:  Procedure Laterality Date  . ESOPHAGOGASTRODUODENOSCOPY    . ESOPHAGOGASTRODUODENOSCOPY N/A 07/18/2016   Procedure: ESOPHAGOGASTRODUODENOSCOPY (EGD);  Surgeon: Rogene Houston, MD;  Location: [facility];  Service: Endoscopy;  Laterality: N/A;  3:00-moved to 230  per Lelon Frohlich  . ORIF WRIST FRACTURE  12/19/2011   Procedure: OPEN REDUCTION INTERNAL FIXATION (ORIF) WRIST FRACTURE;  Surgeon: Carole Civil, MD;  Location: [facility] ORS;  Service: Orthopedics;  Laterality: Right;  . prostate cancer     Diagnosed in the 90s  . TONSILLECTOMY      Prior to Admission medications   Medication Sig Start Date End Date Taking? Authorizing Provider   alfuzosin (UROXATRAL) 10 MG 24 hr tablet Take 10 mg by mouth daily with breakfast.   Yes [provider]  aspirin EC 81 MG tablet Take 81 mg by mouth every morning.   Yes [provider]  calcium carbonate (TUMS - DOSED IN MG ELEMENTAL CALCIUM) 500 MG chewable tablet Chew 1 tablet by mouth 2 (two) times daily as needed for indigestion or heartburn.    Yes [provider]  cholecalciferol (VITAMIN D) 1000 units tablet Take 1,000 Units by mouth daily.   Yes [provider]  diphenhydramine-acetaminophen (TYLENOL PM) 25-500 MG TABS tablet Take 1 tablet by mouth at bedtime.    Yes [provider]  Flax Oil-Fish Oil-Borage Oil (FISH OIL-FLAX OIL-BORAGE OIL) CAPS Take 1 capsule by mouth daily.   Yes [provider]  hydrochlorothiazide (HYDRODIURIL) 25 MG tablet Take 25 mg by mouth daily.   Yes [provider]  lisinopril (PRINIVIL,ZESTRIL) 40 MG tablet Take 40 mg by mouth 2 (two) times daily.   Yes [provider]  LORazepam (ATIVAN) 1 MG tablet Take 1 mg by mouth 2 (two) times daily as needed for anxiety.    Yes [provider]  Melatonin 10 MG TABS Take 10 mg by mouth at bedtime.   Yes [provider]  omeprazole (PRILOSEC) 20 MG capsule Take 20 mg by mouth 2 (two) times daily. 10/23/17  Yes [provider]  potassium chloride SA (K-DUR,KLOR-CON) 20 MEQ tablet Take 20 mEq by mouth 2 (two) times daily.   Yes [provider]  simvastatin (ZOCOR) 10 MG tablet Take 10 mg by mouth at bedtime.    Yes [provider]  terazosin (HYTRIN) 5 MG capsule Take 5 mg by mouth 2 (two) times daily. 09/14/17  Yes [provider]  venlafaxine XR (EFFEXOR-XR) 75 MG 24 hr capsule Take 75 mg by mouth daily.   Yes [provider]  verapamil (CALAN-SR) 240 MG CR tablet Take 240 mg by mouth 2 (two) times daily.   Yes [provider]  vitamin C (ASCORBIC ACID) 500 MG tablet Take 1,000 mg  by mouth daily.   Yes [provider]    Current Facility-Administered Medications  Medication Dose Route Frequency Provider Last Rate Last Dose  . 0.9 % NaCl with KCl 20 mEq/ L  infusion   Intravenous Continuous Jani Gravel, MD 100 mL/hr at 11/07/17 0600    . acetaminophen (TYLENOL) tablet 650 mg  650 mg Oral Q6H PRN Jani Gravel, MD       Or  . acetaminophen (TYLENOL) suppository 650 mg  650 mg Rectal Q6H PRN Jani Gravel, MD      . alfuzosin (UROXATRAL) 24 hr tablet 10 mg  10 mg Oral Q breakfast Jani Gravel, MD      . ciprofloxacin (CIPRO) IVPB 400 mg  400 mg Intravenous Q12H Jani Gravel, MD   Stopped at 11/07/17 0025  . famotidine (PEPCID) IVPB 20 mg premix  20 mg Intravenous Q12H Jani Gravel, MD      . LORazepam (ATIVAN) tablet 1 mg  1  mg Oral BID PRN Jani Gravel, MD      . Melatonin TABS 9 mg  9 mg Oral Loma Sousa, MD      . metroNIDAZOLE (FLAGYL) IVPB 500 mg  500 mg Intravenous Lysle Dingwall, MD   Stopped at 11/07/17 0720  . potassium chloride SA (K-DUR,KLOR-CON) CR tablet 20 mEq  20 mEq Oral BID Jani Gravel, MD   20 mEq at 11/06/17 2129  . saccharomyces boulardii (FLORASTOR) capsule 250 mg  250 mg Oral BID Jani Gravel, MD   250 mg at 11/06/17 2130  . simvastatin (ZOCOR) tablet 10 mg  10 mg Oral Loma Sousa, MD   10 mg at 11/06/17 2130  . venlafaxine XR (EFFEXOR-XR) 24 hr capsule 75 mg  75 mg Oral Daily Jani Gravel, MD      . verapamil (CALAN-SR) CR tablet 240 mg  240 mg Oral BID Jani Gravel, MD   240 mg at 11/06/17 2129    Allergies as of 11/06/2017  . (No Known Allergies)    Family History  Problem Relation Age of Onset  . Cancer Unknown   . Heart attack Mother   . Cancer Brother   . Heart attack Brother   . Heart disease Sister        by pass    Social History   Socioeconomic History  . Marital status: Widowed    Spouse name: Not on file  . Number of children: Not on file  . Years of education: Not on file  . Highest education level: Not on file  Occupational  History  . Occupation: retired    Fish farm manager: RETIRED  Social Needs  . Financial resource strain: Not on file  . Food insecurity:    Worry: Not on file    Inability: Not on file  . Transportation needs:    Medical: Not on file    Non-medical: Not on file  Tobacco Use  . Smoking status: Never Smoker  . Smokeless tobacco: Never Used  Substance and Sexual Activity  . Alcohol use: No  . Drug use: No  . Sexual activity: Not on file  Lifestyle  . Physical activity:    Days per week: Not on file    Minutes per session: Not on file  . Stress: Not on file  Relationships  . Social connections:    Talks on phone: Not on file    Gets together: Not on file    Attends religious service: Not on file    Active member of club or organization: Not on file    Attends meetings of clubs or organizations: Not on file    Relationship status: Not on file  . Intimate partner violence:    Fear of current or ex partner: Not on file    Emotionally abused: Not on file    Physically abused: Not on file    Forced sexual activity: Not on file  Other Topics Concern  . Not on file  Social History Narrative  . Not on file    Review of Systems: General: Negative for anorexia, weight loss, fever, chills, fatigue, weakness. ENT: Negative for hoarseness, difficulty swallowing. CV: Negative for chest pain, angina, palpitations, peripheral edema.  Respiratory: Negative for dyspnea at rest, cough, sputum, wheezing.  GI: See history of present illness. Derm: Negative for rash or itching.  Endo: Negative for unusual weight change.  Heme: Negative for bruising or bleeding. Allergy: Negative for rash or hives.  Physical Exam: Vital signs  in last 24 hours: Temp:  [98.2 F (36.8 C)-98.6 F (37 C)] 98.6 F (37 C) (05/31 0456) Pulse Rate:  [77-101] 78 (05/31 0456) Resp:  [18] 18 (05/30 1649) BP: (93-194)/(60-84) 145/74 (05/31 0456) SpO2:  [91 %-97 %] 91 % (05/31 0456) Weight:  [200 lb (90.7 kg)-200 lb 6.4  oz (90.9 kg)] 200 lb 6.4 oz (90.9 kg) (05/31 0500) Last BM Date: 11/06/17 General:   Alert,  Well-developed, well-nourished, pleasant and cooperative in NAD Head:  Normocephalic and atraumatic. Eyes:  Sclera clear, no icterus. Conjunctiva pink. Ears:  Somewhat hard of hearing. Neck:  Supple; no masses or thyromegaly. Lungs:  Clear throughout to auscultation. No wheezes, crackles, or rhonchi. No acute distress. Heart:  Regular rate and rhythm; no murmurs, clicks, rubs,  or gallops. Abdomen:  Soft, and nondistended. Noted TTP bilateral lower abdominal quadrants. No masses, hepatosplenomegaly or hernias noted. Normal bowel sounds, without guarding, and without rebound.   Rectal:  Deferred.   Msk:  Symmetrical without gross deformities. Pulses:  Normal bilateral DP pulses noted. Extremities:  Without clubbing or edema. Neurologic:  Alert and  oriented x4;  grossly normal neurologically. Psych:  Alert and cooperative. Normal mood and affect.  Intake/Output from previous day: 05/30 0701 - 05/31 0700 In: 2951 [P.O.:240; I.V.:850; IV Piggyback:200] Out: -  Intake/Output this shift: No intake/output data recorded.  Lab Results: Recent Labs    11/05/17 2218 11/06/17 1445 11/07/17 0451  WBC 18.3* 19.6* 18.2*  HGB 14.9 14.7 12.6*  HCT 44.7 43.9 39.0  PLT 197 209 174   BMET Recent Labs    11/05/17 2218 11/06/17 1445 11/07/17 0451  NA 141 140 138  K 3.3* 3.4* 3.4*  CL 101 99* 105  CO2 28 29 27   GLUCOSE 218* 175* 147*  BUN 19 18 19   CREATININE 1.60* 1.31* 1.24  CALCIUM 9.2 9.3 8.8*   LFT Recent Labs    11/06/17 1445 11/07/17 0451  PROT 6.6 5.5*  ALBUMIN 3.7 3.1*  AST 28 26  ALT 21 18  ALKPHOS 63 49  BILITOT 0.9 0.9   PT/INR Recent Labs    11/05/17 2218  LABPROT 14.3  INR 1.12   Hepatitis Panel No results for input(s): HEPBSAG, HCVAB, HEPAIGM, HEPBIGM in the last 72 hours. C-Diff Recent Labs    11/06/17 1751  CDIFFTOX NEGATIVE    Studies/Results: Ct  Abdomen Pelvis Wo Contrast  Result Date: 11/07/2017 CLINICAL DATA:  Acute onset of bilateral lower quadrant abdominal pain. Hypertension and hyperlipidemia. EXAM: CT ABDOMEN AND PELVIS WITHOUT CONTRAST TECHNIQUE: Multidetector CT imaging of the abdomen and pelvis was performed following the standard protocol without IV contrast. COMPARISON:  CT of the abdomen and pelvis performed 11/04/2017 FINDINGS: Lower chest: Mild scarring is noted at the lung bases. Scattered coronary artery calcifications are seen. Hepatobiliary: A 1.7 cm nonspecific hypodensity is noted at the right hepatic lobe. The liver is otherwise unremarkable. Stones are seen dependently within the gallbladder. The gallbladder is otherwise unremarkable. The common bile duct remains normal in caliber. Pancreas: The pancreas is within normal limits. Spleen: The spleen is unremarkable in appearance. Adrenals/Urinary Tract: The adrenal glands are unremarkable in appearance. Large bilateral renal cysts are seen. Nonobstructing bilateral renal stones are noted, measuring up to 4 mm on the right. There is no evidence of hydronephrosis. No obstructing ureteral stones are seen. Minimal left-sided pelvicaliectasis remains within normal limits. Stomach/Bowel: There is diffuse wall thickening along the distal transverse, descending and sigmoid colon, with surrounding soft tissue inflammation and  trace fluid along the left paracolic gutter, compatible with infectious or inflammatory colitis. Underlying diverticulosis is noted along the sigmoid colon, without definite evidence of focal diverticulitis. The remaining colon is unremarkable in appearance. The appendix is not definitely characterized; there is no evidence for appendicitis. The small bowel is unremarkable in appearance. The stomach is decompressed and grossly unremarkable. Vascular/Lymphatic: Diffuse calcification is seen along the abdominal aorta and its branches. The abdominal aorta is otherwise  grossly unremarkable. The inferior vena cava is grossly unremarkable. No retroperitoneal lymphadenopathy is seen. No pelvic sidewall lymphadenopathy is identified. Reproductive: The bladder is mildly distended and grossly unremarkable. There is impression on the base of the bladder by the patient's prostate. The prostate remains normal in size. Other: No additional soft tissue abnormalities are seen. Musculoskeletal: No acute osseous abnormalities are identified. Vacuum phenomenon is noted at L4-L5. The visualized musculature is unremarkable in appearance. IMPRESSION: 1. Diffuse wall thickening along the distal transverse, descending and sigmoid colon, with surrounding soft tissue inflammation and trace fluid along the left paracolic gutter, compatible with infectious or inflammatory colitis. 2. Underlying diverticulosis along the sigmoid colon. 3. Scattered coronary artery calcifications seen. 4. 1.7 cm nonspecific hypodensity at the right hepatic lobe. Would correlate with LFTs. 5. Nonobstructing bilateral renal stones, measuring up to 4 mm on the right. 6. Cholelithiasis. Gallbladder otherwise unremarkable. Aortic Atherosclerosis (ICD10-I70.0). Electronically Signed   By: Garald Balding M.D.   On: 11/07/2017 00:00    Impression: Admitted with abdominal pain and rectal bleeding with decline in hgb, although still >10. CT consistent with bowel wall thickening transverse colon through sigmoid colon consistent with infectious vs inflammatory colitis.  Given the fact that he felt ill prior to his symptoms and his elevated white blood cell count infectious colitis is at the top of his differentials.  He is still having abdominal pain this morning.  He did have a bowel movement this morning with rectal bleeding, although he feels it was much less blood than previous.  Denies nausea and vomiting.  He is wanting to eat.  He is wanting to go home soon.  No indication for endoscopic evaluation at this  time.  Plan: 1. [place] for further GI bleeding 2. Follow hgb and WBC count (CBC in the morning) 3. Agree with antibiotics 4. Transfuse as necessary 5. Continue oral PPI (history of GERD) 6. If he develops a need for endoscopic evaluation over the weekend, will need to be transferred to [facility] 7. Supportive measures   ** Please note there is no GI coverage this weekend from Friday 11/07/17 at 5 pm until Monday 11/10/17 at 7 am. **   Thank you for allowing [place] to participate in the care of [place], DNP, AGNP-C Adult & Gerontological Nurse Practitioner [facility]    LOS: 1 day     11/07/2017, 8:07 AM

## 2017-11-07 NOTE — Progress Notes (Signed)
PROGRESS NOTE    Jeffrey Sherman  GQB:[id number] DOB: January 23, 1930 DOA: 11/06/2017 PCP: Celene Squibb, MD     Brief Narrative:  84 year old male with past medical history significant for diabetes, BPH, hypertension, hyperlipidemia and diverticulosis.  Who was admitted secondary to abdominal pain, elevated WBCs and rectal bleeding.  Found with acute infectious colitis/diverticulitis.  Assessment & Plan: 1-acute infectious diverticulitis: -Continue Cipro and Flagyl -Slowly advance diet -Continue as needed analgesics -Continue IV fluids and supportive care -PRN antiemetics. -Patient is still having some dark bloody stools but hollowed less than prior to admission and reports improvement in his abdominal pain. -colonoscopy in 4-6 weeks  2-hypertension: -Continue verapamil. -BP stable and under fair control  3-hyperlipidemia -Continue Zocor  4-history of BPH: Without acute urinary symptoms  -continue alfuzosin   5-GERD: -Continue PPI daily.  6-depression/anxiety -Continue Effexor and as needed Ativan.  7-acute kidney injury: In the setting of dehydration and continue use of nephrotoxic agents -Continue holding diuretics and ACE inhibitors -IV fluid has been provided -Creatinine trending down.  Will follow be met in a.m.  DVT prophylaxis: SCDs Code Status: Full code Family Communication: Son and daughter-in-law at bedside. Disposition Plan: Continue IV antibiotics for diverticulitis.  Slowly advance diet.  Follow hemoglobin trend.  Consultants:   GI service  Procedures:   See below for x-ray reports.  Antimicrobials:  Anti-infectives (From admission, onward)   Start     Dose/Rate Route Frequency Ordered Stop   11/06/17 2200  ciprofloxacin (CIPRO) IVPB 400 mg     400 mg 200 mL/hr over 60 Minutes Intravenous Every 12 hours 11/06/17 2111     11/06/17 2100  metroNIDAZOLE (FLAGYL) IVPB 500 mg     500 mg 100 mL/hr over 60 Minutes Intravenous Every 8 hours 11/06/17 2049        Subjective: No nausea, no vomiting.  Still reporting mild to moderate abdominal discomfort.  Had some dark bloody bowel movement earlier this morning.  Objective: Vitals:   11/06/17 2014 11/06/17 2017 11/07/17 0456 11/07/17 0500  BP:  (!) 154/84 (!) 145/74   Pulse:  78 78   Resp:      Temp:  98.2 F (36.8 C) 98.6 F (37 C)   TempSrc:  Oral Oral   SpO2: 93% 97% 91%   Weight:    90.9 kg (200 lb 6.4 oz)  Height:        Intake/Output Summary (Last 24 hours) at 11/07/2017 1803 Last data filed at 11/07/2017 0900 Gross per 24 hour  Intake 1770 ml  Output 250 ml  Net 1520 ml   Filed Weights   11/06/17 1429 11/07/17 0500  Weight: 90.7 kg (200 lb) 90.9 kg (200 lb 6.4 oz)    Examination: General exam: Alert, awake, oriented x 3.  Still reporting mild abdominal discomfort.  No nausea, no vomiting. Respiratory system: Clear to auscultation. Respiratory effort normal. Cardiovascular system:RRR. No murmurs, rubs, gallops. Gastrointestinal system: Abdomen is nondistended, soft and tender to palpation to bilateral lower quadrants with deep palpation. No organomegaly or masses felt. Normal bowel sounds heard. Central nervous system: Alert and oriented. No focal neurological deficits. Extremities: No C/C/E, +pedal pulses Skin: No rashes, lesions or ulcers Psychiatry: Judgement and insight appear normal. Mood & affect appropriate.   Data Reviewed: I have personally reviewed following labs and imaging studies  CBC: Recent Labs  Lab 11/05/17 2218 11/06/17 1445 11/07/17 0451  WBC 18.3* 19.6* 18.2*  NEUTROABS 16.2  --   --   HGB 14.9  14.7 12.6*  HCT 44.7 43.9 39.0  MCV 83.7 82.7 83.5  PLT 197 209 774   Basic Metabolic Panel: Recent Labs  Lab 11/04/17 0915 11/05/17 2218 11/06/17 1445 11/07/17 0451  NA  --  141 140 138  K  --  3.3* 3.4* 3.4*  CL  --  101 99* 105  CO2  --  28 29 27   GLUCOSE  --  218* 175* 147*  BUN  --  19 18 19   CREATININE 1.10 1.60* 1.31* 1.24    CALCIUM  --  9.2 9.3 8.8*   GFR: Estimated Creatinine Clearance: 48.5 mL/min (by C-G formula based on SCr of 1.24 mg/dL).   Liver Function Tests: Recent Labs  Lab 11/06/17 1445 11/07/17 0451  AST 28 26  ALT 21 18  ALKPHOS 63 49  BILITOT 0.9 0.9  PROT 6.6 5.5*  ALBUMIN 3.7 3.1*   Coagulation Profile: Recent Labs  Lab 11/05/17 2218  INR 1.12   HbA1C: Recent Labs    11/06/17 1445  HGBA1C 5.5   Urine analysis:    Component Value Date/Time   COLORURINE YELLOW 05/08/2014 2045   APPEARANCEUR CLEAR 05/08/2014 2045   LABSPEC >1.030 (H) 05/08/2014 2045   PHURINE 7.0 05/08/2014 2045   GLUCOSEU NEGATIVE 05/08/2014 2045   HGBUR MODERATE (A) 05/08/2014 2045   BILIRUBINUR NEGATIVE 05/08/2014 2045   KETONESUR NEGATIVE 05/08/2014 2045   PROTEINUR 100 (A) 05/08/2014 2045   UROBILINOGEN 0.2 05/08/2014 2045   NITRITE NEGATIVE 05/08/2014 2045   LEUKOCYTESUR NEGATIVE 05/08/2014 2045   Sepsis Labs:  Recent Results (from the past 240 hour(s))  C difficile quick scan w PCR reflex     Status: None   Collection Time: 11/06/17  5:51 PM  Result Value Ref Range Status   C Diff antigen NEGATIVE NEGATIVE Final   C Diff toxin NEGATIVE NEGATIVE Final   C Diff interpretation No C. difficile detected.  Final    Comment: Performed at [facility], [address], [place], [place] [address]     Radiology Studies: Ct Abdomen Pelvis Wo Contrast  Result Date: 11/07/2017 CLINICAL DATA:  Acute onset of bilateral lower quadrant abdominal pain. Hypertension and hyperlipidemia. EXAM: CT ABDOMEN AND PELVIS WITHOUT CONTRAST TECHNIQUE: Multidetector CT imaging of the abdomen and pelvis was performed following the standard protocol without IV contrast. COMPARISON:  CT of the abdomen and pelvis performed 11/04/2017 FINDINGS: Lower chest: Mild scarring is noted at the lung bases. Scattered coronary artery calcifications are seen. Hepatobiliary: A 1.7 cm nonspecific hypodensity is noted at the right hepatic  lobe. The liver is otherwise unremarkable. Stones are seen dependently within the gallbladder. The gallbladder is otherwise unremarkable. The common bile duct remains normal in caliber. Pancreas: The pancreas is within normal limits. Spleen: The spleen is unremarkable in appearance. Adrenals/Urinary Tract: The adrenal glands are unremarkable in appearance. Large bilateral renal cysts are seen. Nonobstructing bilateral renal stones are noted, measuring up to 4 mm on the right. There is no evidence of hydronephrosis. No obstructing ureteral stones are seen. Minimal left-sided pelvicaliectasis remains within normal limits. Stomach/Bowel: There is diffuse wall thickening along the distal transverse, descending and sigmoid colon, with surrounding soft tissue inflammation and trace fluid along the left paracolic gutter, compatible with infectious or inflammatory colitis. Underlying diverticulosis is noted along the sigmoid colon, without definite evidence of focal diverticulitis. The remaining colon is unremarkable in appearance. The appendix is not definitely characterized; there is no evidence for appendicitis. The small bowel is unremarkable in appearance.  The stomach is decompressed and grossly unremarkable. Vascular/Lymphatic: Diffuse calcification is seen along the abdominal aorta and its branches. The abdominal aorta is otherwise grossly unremarkable. The inferior vena cava is grossly unremarkable. No retroperitoneal lymphadenopathy is seen. No pelvic sidewall lymphadenopathy is identified. Reproductive: The bladder is mildly distended and grossly unremarkable. There is impression on the base of the bladder by the patient's prostate. The prostate remains normal in size. Other: No additional soft tissue abnormalities are seen. Musculoskeletal: No acute osseous abnormalities are identified. Vacuum phenomenon is noted at L4-L5. The visualized musculature is unremarkable in appearance. IMPRESSION: 1. Diffuse wall  thickening along the distal transverse, descending and sigmoid colon, with surrounding soft tissue inflammation and trace fluid along the left paracolic gutter, compatible with infectious or inflammatory colitis. 2. Underlying diverticulosis along the sigmoid colon. 3. Scattered coronary artery calcifications seen. 4. 1.7 cm nonspecific hypodensity at the right hepatic lobe. Would correlate with LFTs. 5. Nonobstructing bilateral renal stones, measuring up to 4 mm on the right. 6. Cholelithiasis. Gallbladder otherwise unremarkable. Aortic Atherosclerosis (ICD10-I70.0). Electronically Signed   By: Garald Balding M.D.   On: 11/07/2017 00:00    Scheduled Meds: . alfuzosin  10 mg Oral Q breakfast  . Melatonin  9 mg Oral QHS  . pantoprazole  40 mg Oral QAC breakfast  . potassium chloride SA  20 mEq Oral BID  . saccharomyces boulardii  250 mg Oral BID  . simvastatin  10 mg Oral QHS  . venlafaxine XR  75 mg Oral Daily  . verapamil  240 mg Oral BID   Continuous Infusions: . ciprofloxacin Stopped (11/07/17 1048)  . metronidazole Stopped (11/07/17 1639)     LOS: 1 day    Time spent: 25 minutes     Barton Dubois, MD [facility] Pager [phone]  If 7PM-7AM, please contact night-coverage [email or web address] Password [facility] 11/07/2017, 6:03 PM

## 2017-11-07 NOTE — Telephone Encounter (Signed)
OPV W/ DR. Laural Golden WITHIN 4-6 WEEKS, Dx: COLITIS & DISCUSS THE BENEFITS V.RISKS OF TCS.

## 2017-11-08 LAB — BASIC METABOLIC PANEL
ANION GAP: 6 (ref 5–15)
BUN: 16 mg/dL (ref 6–20)
CALCIUM: 8.8 mg/dL — AB (ref 8.9–10.3)
CO2: 28 mmol/L (ref 22–32)
Chloride: 108 mmol/L (ref 101–111)
Creatinine, Ser: 1 mg/dL (ref 0.61–1.24)
GFR calc Af Amer: >60 mL/min (ref >60)
GFR calc non Af Amer: >60 mL/min (ref >60)
GLUCOSE: 117 mg/dL — AB (ref 65–99)
Potassium: 3.3 mmol/L — ABNORMAL LOW (ref 3.5–5.1)
Sodium: 142 mmol/L (ref 135–145)

## 2017-11-08 LAB — CBC
HEMATOCRIT: 36 % — AB (ref 39.0–52.0)
Hemoglobin: 11.6 g/dL — ABNORMAL LOW (ref 13.0–17.0)
MCH: 27.4 pg (ref 26.0–34.0)
MCHC: 32.2 g/dL (ref 30.0–36.0)
MCV: 85.1 fL (ref 78.0–100.0)
Platelets: 172 10*3/uL (ref 150–400)
RBC: 4.23 MIL/uL (ref 4.22–5.81)
RDW: 14.3 % (ref 11.5–15.5)
WBC: 14.6 10*3/uL — AB (ref 4.0–10.5)

## 2017-11-08 MED ORDER — SIMVASTATIN 10 MG PO TABS
10.0000 mg | ORAL_TABLET | Freq: Every day | ORAL | Status: DC
Start: 2017-11-23 — End: 2020-03-22

## 2017-11-08 MED ORDER — METRONIDAZOLE 500 MG PO TABS: 500.0000 mg | ORAL_TABLET | Freq: Three times a day (TID) | ORAL | 0 refills | Status: AC

## 2017-11-08 MED ORDER — CIPROFLOXACIN HCL 500 MG PO TABS: 500.0000 mg | ORAL_TABLET | Freq: Two times a day (BID) | ORAL | 0 refills | Status: AC

## 2017-11-08 MED ORDER — SACCHAROMYCES BOULARDII 250 MG PO CAPS: 250.0000 mg | ORAL_CAPSULE | Freq: Two times a day (BID) | ORAL | 0 refills | Status: DC

## 2017-11-08 NOTE — Progress Notes (Addendum)
Patient's IVs removed.  Sites WNL.  Sites WNL. AVS reviewed with patient.  Verbalized understanding of discharge instructions, physician follow-up, medications.  Prescriptions given to patient.  Patient stable at time of discharge.

## 2017-11-08 NOTE — Discharge Summary (Signed)
Physician Discharge Summary  Jeffrey Sherman TKW:[id number] DOB: 1930-02-02 DOA: 11/06/2017  PCP: Celene Squibb, MD  Admit date: 11/06/2017 Discharge date: 11/08/2017  Time spent: 35 minutes  Recommendations for Outpatient Follow-up:  1. Repeat CBC to follow hemoglobin and WBCs trend 2. Repeat basic metabolic panel to follow electrolytes and renal function 3. Reassess blood pressure and further adjust antihypertensive regimen as needed.   Discharge Diagnoses:  Active Problems:   Rectal bleeding   Abdominal pain   Hypokalemia   Renal insufficiency   Hyperglycemia   Infectious colitis   Anemia   Leukocytosis BPH  Discharge Condition: Stable and improved.  Patient tolerating diet without any further episode of nausea/vomiting.  Discharge home with instructions to follow-up with PCP in 10 days and to follow-up with gastroenterology service in 4-6 weeks.  Diet recommendation: Heart healthy/low residue diet.  Filed Weights   11/06/17 1429 11/07/17 0500  Weight: 90.7 kg (200 lb) 90.9 kg (200 lb 6.4 oz)    Brief Narrative of present illness:   84 year old male with past medical history significant for diabetes, BPH, hypertension, hyperlipidemia and diverticulosis.  Who was admitted secondary to abdominal pain, elevated WBCs and rectal bleeding.  Found with acute infectious colitis/diverticulitis.  Hospital Course:  1-acute infectious diverticulitis: -Continue Cipro and Flagyl for 10 days -Continue as needed analgesics -Continue probiotics -Patient without blood in his stools at discharge; having mild loose BM's.  -advise to follow low residue diet and to maintain adequate hydration. -colonoscopy in 4-6 weeks  2-hypertension: -BP stable and under fair control -Okay to resume home antihypertensive regimen. -Patient advised to follow low-sodium diet.  3-hyperlipidemia -Continue Zocor after completing treatment with ciprofloxacin   4-history of BPH:  -Without acute urinary  symptoms  -continue Alfuzosin and Hytrin   5-GERD: -Continue PPI daily.  6-depression/anxiety -Continue Effexor and as needed Ativan. -No suicidal ideation or hallucination -Mood is a stable.  7-acute kidney injury: In the setting of dehydration and continue use of nephrotoxic agents -Resolved back to normal range at discharge -Patient advised to keep himself well-hydrated -Okay to resume home diuretics and antihypertensive regimen at this point. -We will recommend repeat basic metabolic panel at follow-up visit to reassess electrolytes and renal function trend.  8-hypokalemia -Repleted and patient will be discharged on potassium supplementation. -As mentioned above repeat basic metabolic panel to reassess electrolytes trend.    Procedures:  See below for x-ray reports.  Consultations:  GI service  Discharge Exam: Vitals:   11/07/17 2118 11/08/17 0529  BP: (!) 166/74 (!) 155/70  Pulse: 74 72  Resp:    Temp: 98.4 F (36.9 C) 98.3 F (36.8 C)  SpO2: 95% 94%   General exam: Alert, awake, oriented x 3.  reports having some loose stools, but no further bleeding. No nausea, no vomiting and able to tolerate diet. Mild lower quadrant pain reported. Respiratory system: Clear to auscultation. Respiratory effort normal. Cardiovascular system:RRR. No murmurs, rubs, gallops. Gastrointestinal system: Abdomen is nondistended, soft and with very mild tenderness to palpation in lower quadrants (L > R). No organomegaly or masses felt. Normal bowel sounds heard. Central nervous system: Alert and oriented. No focal neurological deficits. Extremities: No C/C/E, +pedal pulses Skin: No rashes, lesions or ulcers Psychiatry: Judgement and insight appear normal. Mood & affect appropriate.   Discharge Instructions   Discharge Instructions    Diet - low sodium heart healthy   Complete by:  As directed    Discharge instructions   Complete by:  As directed  Take medications as  prescribed Keep yourself well-hydrated Follow-up with PCP in 10 days Contact gastroenterologist office to arrange follow-up visit in 4-6 weeks. Increase fiber intake and follow a low residue diet.     Allergies as of 11/08/2017   No Known Allergies     Medication List    TAKE these medications   alfuzosin 10 MG 24 hr tablet Commonly known as:  UROXATRAL Take 10 mg by mouth daily with breakfast.   aspirin EC 81 MG tablet Take 81 mg by mouth every morning.   calcium carbonate 500 MG chewable tablet Commonly known as:  TUMS - dosed in mg elemental calcium Chew 1 tablet by mouth 2 (two) times daily as needed for indigestion or heartburn.   cholecalciferol 1000 units tablet Commonly known as:  VITAMIN D Take 1,000 Units by mouth daily.   ciprofloxacin 500 MG tablet Commonly known as:  CIPRO Take 1 tablet (500 mg total) by mouth 2 (two) times daily for 10 days.   diphenhydramine-acetaminophen 25-500 MG Tabs tablet Commonly known as:  TYLENOL PM Take 1 tablet by mouth at bedtime.   Fish Oil-Flax Oil-Borage Oil Caps Take 1 capsule by mouth daily.   hydrochlorothiazide 25 MG tablet Commonly known as:  HYDRODIURIL Take 25 mg by mouth daily.   lisinopril 40 MG tablet Commonly known as:  PRINIVIL,ZESTRIL Take 40 mg by mouth 2 (two) times daily.   LORazepam 1 MG tablet Commonly known as:  ATIVAN Take 1 mg by mouth 2 (two) times daily as needed for anxiety.   Melatonin 10 MG Tabs Take 10 mg by mouth at bedtime.   metroNIDAZOLE 500 MG tablet Commonly known as:  FLAGYL Take 1 tablet (500 mg total) by mouth 3 (three) times daily for 10 days.   omeprazole 20 MG capsule Commonly known as:  PRILOSEC Take 20 mg by mouth 2 (two) times daily.   potassium chloride SA 20 MEQ tablet Commonly known as:  K-DUR,KLOR-CON Take 20 mEq by mouth 2 (two) times daily.   saccharomyces boulardii 250 MG capsule Commonly known as:  FLORASTOR Take 1 capsule (250 mg total) by mouth 2 (two)  times daily.   simvastatin 10 MG tablet Commonly known as:  ZOCOR Take 1 tablet (10 mg total) by mouth at bedtime. Start taking on:  11/23/2017 What changed:  These instructions start on 11/23/2017. If you are unsure what to do until then, ask your doctor or other care provider.   terazosin 5 MG capsule Commonly known as:  HYTRIN Take 5 mg by mouth 2 (two) times daily.   venlafaxine XR 75 MG 24 hr capsule Commonly known as:  EFFEXOR-XR Take 75 mg by mouth daily.   verapamil 240 MG CR tablet Commonly known as:  CALAN-SR Take 240 mg by mouth 2 (two) times daily.   vitamin C 500 MG tablet Commonly known as:  ASCORBIC ACID Take 1,000 mg by mouth daily.      No Known Allergies Follow-up Information    Celene Squibb, MD. Schedule an appointment as soon as possible for a visit in 10 day(s).   Specialty:  Internal Medicine Contact information: [place] [address] [phone]           The results of significant diagnostics from this hospitalization (including imaging, microbiology, ancillary and laboratory) are listed below for reference.    Significant Diagnostic Studies: Ct Abdomen Pelvis Wo Contrast  Result Date: 11/07/2017 CLINICAL DATA:  Acute onset of bilateral lower  quadrant abdominal pain. Hypertension and hyperlipidemia. EXAM: CT ABDOMEN AND PELVIS WITHOUT CONTRAST TECHNIQUE: Multidetector CT imaging of the abdomen and pelvis was performed following the standard protocol without IV contrast. COMPARISON:  CT of the abdomen and pelvis performed 11/04/2017 FINDINGS: Lower chest: Mild scarring is noted at the lung bases. Scattered coronary artery calcifications are seen. Hepatobiliary: A 1.7 cm nonspecific hypodensity is noted at the right hepatic lobe. The liver is otherwise unremarkable. Stones are seen dependently within the gallbladder. The gallbladder is otherwise unremarkable. The common bile duct remains normal in caliber. Pancreas: The pancreas is  within normal limits. Spleen: The spleen is unremarkable in appearance. Adrenals/Urinary Tract: The adrenal glands are unremarkable in appearance. Large bilateral renal cysts are seen. Nonobstructing bilateral renal stones are noted, measuring up to 4 mm on the right. There is no evidence of hydronephrosis. No obstructing ureteral stones are seen. Minimal left-sided pelvicaliectasis remains within normal limits. Stomach/Bowel: There is diffuse wall thickening along the distal transverse, descending and sigmoid colon, with surrounding soft tissue inflammation and trace fluid along the left paracolic gutter, compatible with infectious or inflammatory colitis. Underlying diverticulosis is noted along the sigmoid colon, without definite evidence of focal diverticulitis. The remaining colon is unremarkable in appearance. The appendix is not definitely characterized; there is no evidence for appendicitis. The small bowel is unremarkable in appearance. The stomach is decompressed and grossly unremarkable. Vascular/Lymphatic: Diffuse calcification is seen along the abdominal aorta and its branches. The abdominal aorta is otherwise grossly unremarkable. The inferior vena cava is grossly unremarkable. No retroperitoneal lymphadenopathy is seen. No pelvic sidewall lymphadenopathy is identified. Reproductive: The bladder is mildly distended and grossly unremarkable. There is impression on the base of the bladder by the patient's prostate. The prostate remains normal in size. Other: No additional soft tissue abnormalities are seen. Musculoskeletal: No acute osseous abnormalities are identified. Vacuum phenomenon is noted at L4-L5. The visualized musculature is unremarkable in appearance. IMPRESSION: 1. Diffuse wall thickening along the distal transverse, descending and sigmoid colon, with surrounding soft tissue inflammation and trace fluid along the left paracolic gutter, compatible with infectious or inflammatory colitis. 2.  Underlying diverticulosis along the sigmoid colon. 3. Scattered coronary artery calcifications seen. 4. 1.7 cm nonspecific hypodensity at the right hepatic lobe. Would correlate with LFTs. 5. Nonobstructing bilateral renal stones, measuring up to 4 mm on the right. 6. Cholelithiasis. Gallbladder otherwise unremarkable. Aortic Atherosclerosis (ICD10-I70.0). Electronically Signed   By: Garald Balding M.D.   On: 11/07/2017 00:00   Ct Abdomen Pelvis W Contrast  Result Date: 11/04/2017 CLINICAL DATA:  Follow-up prostate carcinoma. EXAM: CT ABDOMEN AND PELVIS WITH CONTRAST TECHNIQUE: Multidetector CT imaging of the abdomen and pelvis was performed using the standard protocol following bolus administration of intravenous contrast. CONTRAST:  140mL ISOVUE-300 IOPAMIDOL (ISOVUE-300) INJECTION 61% COMPARISON:  11/22/2016 FINDINGS: Lower Chest: No acute findings. Hepatobiliary: Small cyst in inferior left hepatic lobe remains stable. A tiny sub-cm hypervascular focus in the anterior left hepatic lobe remains stable or decreased in size since previous study. No new or enlarging liver masses identified. Several tiny calcified gallstones are seen, however there is no evidence of cholecystitis or biliary ductal dilatation. Pancreas:  No mass or inflammatory changes. Spleen: Within normal limits in size and appearance. Adrenals/Urinary Tract: Stable 3 mm nonobstructing calculus in lower pole of right kidney. No evidence of ureteral calculi or hydronephrosis. Bilateral renal cysts show no significant change, and there is no evidence of renal mass or hydronephrosis. Several small bladder diverticula noted.  Stomach/Bowel: No evidence of obstruction, inflammatory process or abnormal fluid collections. Diverticulosis again seen involving the descending and sigmoid colon, without evidence of diverticulitis. Vascular/Lymphatic: No pathologically enlarged lymph nodes. No abdominal aortic aneurysm. Aortic atherosclerosis. Retroaortic  left renal vein incidentally noted. Reproductive: Stable mildly enlarged prostate gland with mass effect on bladder base. Symmetric seminal vesicles. Other:  None. Musculoskeletal:  No suspicious bone lesions identified. IMPRESSION: Stable mildly enlarged prostate gland. No evidence of recurrent or metastatic disease. Stable small bladder diverticula. Colonic diverticulosis, without radiographic evidence of diverticulitis. Stable tiny nonobstructing right renal calculus. Cholelithiasis.  No radiographic evidence of cholecystitis. Electronically Signed   By: Earle Gell M.D.   On: 11/04/2017 10:13    Microbiology: Recent Results (from the past 240 hour(s))  Gastrointestinal Panel by PCR , Stool     Status: None   Collection Time: 11/06/17  5:51 PM  Result Value Ref Range Status   Campylobacter species NOT DETECTED NOT DETECTED Final   Plesimonas shigelloides NOT DETECTED NOT DETECTED Final   Salmonella species NOT DETECTED NOT DETECTED Final   Yersinia enterocolitica NOT DETECTED NOT DETECTED Final   Vibrio species NOT DETECTED NOT DETECTED Final   Vibrio cholerae NOT DETECTED NOT DETECTED Final   Enteroaggregative E coli (EAEC) NOT DETECTED NOT DETECTED Final   Enteropathogenic E coli (EPEC) NOT DETECTED NOT DETECTED Final   Enterotoxigenic E coli (ETEC) NOT DETECTED NOT DETECTED Final   Shiga like toxin producing E coli (STEC) NOT DETECTED NOT DETECTED Final   Shigella/Enteroinvasive E coli (EIEC) NOT DETECTED NOT DETECTED Final   Cryptosporidium NOT DETECTED NOT DETECTED Final   Cyclospora cayetanensis NOT DETECTED NOT DETECTED Final   Entamoeba histolytica NOT DETECTED NOT DETECTED Final   Giardia lamblia NOT DETECTED NOT DETECTED Final   Adenovirus F40/41 NOT DETECTED NOT DETECTED Final   Astrovirus NOT DETECTED NOT DETECTED Final   Norovirus GI/GII NOT DETECTED NOT DETECTED Final   Rotavirus A NOT DETECTED NOT DETECTED Final   Sapovirus (I, II, IV, and V) NOT DETECTED NOT DETECTED  Final    Comment: Performed at [facility], [address] [place], [place] [address]  C difficile quick scan w PCR reflex     Status: None   Collection Time: 11/06/17  5:51 PM  Result Value Ref Range Status   C Diff antigen NEGATIVE NEGATIVE Final   C Diff toxin NEGATIVE NEGATIVE Final   C Diff interpretation No C. difficile detected.  Final    Comment: Performed at [facility], [address] [place], [place] [address]     Labs: Basic Metabolic Panel: Recent Labs  Lab 11/04/17 0915 11/05/17 2218 11/06/17 1445 11/07/17 0451 11/08/17 0706  NA  --  141 140 138 142  K  --  3.3* 3.4* 3.4* 3.3*  CL  --  101 99* 105 108  CO2  --  28 29 27 28   GLUCOSE  --  218* 175* 147* 117*  BUN  --  19 18 19 16   CREATININE 1.10 1.60* 1.31* 1.24 1.00  CALCIUM  --  9.2 9.3 8.8* 8.8*   Liver Function Tests: Recent Labs  Lab 11/06/17 1445 11/07/17 0451  AST 28 26  ALT 21 18  ALKPHOS 63 49  BILITOT 0.9 0.9  PROT 6.6 5.5*  ALBUMIN 3.7 3.1*   CBC: Recent Labs  Lab 11/05/17 2218 11/06/17 1445 11/07/17 0451 11/08/17 0706  WBC 18.3* 19.6* 18.2* 14.6*  NEUTROABS 16.2  --   --   --  HGB 14.9 14.7 12.6* 11.6*  HCT 44.7 43.9 39.0 36.0*  MCV 83.7 82.7 83.5 85.1  PLT 197 209 174 172    Signed:  Barton Dubois MD.  [facility] 11/08/2017, 1:10 PM

## 2017-11-10 NOTE — Telephone Encounter (Signed)
Forwarded to [employer] for OV

## 2017-11-14 DIAGNOSIS — Z6830 Body mass index (BMI) 30.0-30.9, adult: Secondary | ICD-10-CM | POA: Diagnosis not present

## 2017-11-14 DIAGNOSIS — I10 Essential (primary) hypertension: Secondary | ICD-10-CM | POA: Diagnosis not present

## 2017-11-14 DIAGNOSIS — K5792 Diverticulitis of intestine, part unspecified, without perforation or abscess without bleeding: Secondary | ICD-10-CM | POA: Diagnosis not present

## 2017-11-28 DIAGNOSIS — Z6830 Body mass index (BMI) 30.0-30.9, adult: Secondary | ICD-10-CM | POA: Diagnosis not present

## 2017-11-28 DIAGNOSIS — I10 Essential (primary) hypertension: Secondary | ICD-10-CM | POA: Diagnosis not present

## 2017-11-28 DIAGNOSIS — K5781 Diverticulitis of intestine, part unspecified, with perforation and abscess with bleeding: Secondary | ICD-10-CM | POA: Diagnosis not present

## 2017-12-23 ENCOUNTER — Ambulatory Visit (INDEPENDENT_AMBULATORY_CARE_PROVIDER_SITE_OTHER): Payer: Medicare Other | Admitting: Internal Medicine

## 2017-12-23 ENCOUNTER — Encounter (INDEPENDENT_AMBULATORY_CARE_PROVIDER_SITE_OTHER): Payer: Self-pay | Admitting: Internal Medicine

## 2017-12-23 VITALS — BP 130/90 | HR 74 | Temp 97.9°F | Resp 18 | Ht 66.0 in | Wt 200.0 lb

## 2017-12-23 DIAGNOSIS — Z8719 Personal history of other diseases of the digestive system: Secondary | ICD-10-CM

## 2017-12-23 DIAGNOSIS — K219 Gastro-esophageal reflux disease without esophagitis: Secondary | ICD-10-CM

## 2017-12-23 MED ORDER — OMEPRAZOLE 20 MG PO CPDR: 20.0000 mg | DELAYED_RELEASE_CAPSULE | Freq: Every day | ORAL | 5 refills | Status: DC

## 2017-12-23 NOTE — Patient Instructions (Signed)
Notify if abdominal pain or rectal bleeding recurs. Notify if decreasing Prilosec/omeprazole dose causes breakthrough symptoms. Please have lab send a copy of your blood work to be done in 3 weeks.

## 2017-12-23 NOTE — Progress Notes (Signed)
Presenting complaint;  Follow-up to recent hospitalization for left-sided colitis.  Database and subjective:  Patient is 84 year old Caucasian male with multiple medical problems who was in usual state of health until 11/05/2017 when he noted left-sided abdominal pain and rectal bleeding.  He was seen in emergency room that evening.  His WBC was 18.3 but H&H was 14.9 and 44.7.  Patient had scheduled unenhanced CT the day before as a follow-up to his prostate cancer.  This study revealed diverticulosis but no diverticulitis.  Therefore patient was felt to be bleeding most likely from colonic diverticulosis.  He was deemed to be stable and discharged.  Patient returned the next day with persistent left-sided pain and large volume hematochezia.  His WBC was 9.6 and H&H was 14.7 and 43.9.  Abdominopelvic CT was obtained without IV contrast.  This study revealed diffuse wall thickening starting at distal transverse colon and involving splenic flexure descending colon as well as proximal sigmoid colon.  He also had sigmoid colon diverticulosis nonspecific hypodensity in the right hepatic lobe and nonobstructing renal calculi and cholelithiasis. Patient was hospitalized.  He was empirically treated with antibiotics.  GI pathogen panel was negative and C. difficile quick scan was also negative. He was seen in consultation by Dr. Gala Romney during my absence. He improved and was discharged.  At the time of discharge WBC was 14.6 and hemoglobin was 11.6.  Patient is accompanied by his daughter-in-law Silva Bandy. His abdominal pain has completely resolved and he has not passed any more blood per rectum.  His daughter-in-law states that it took him 3 to 4 weeks  before he felt better.  He is having formed stools daily.  He has good appetite.  He says heartburn is well controlled with therapy and he may take Tums once or twice a month.  He denies dysphagia epigastric pain or melena.      Current Medications: Outpatient  Encounter Medications as of 12/23/2017  Medication Sig  . alfuzosin (UROXATRAL) 10 MG 24 hr tablet Take 10 mg by mouth daily with breakfast.  . aspirin EC 81 MG tablet Take 81 mg by mouth every morning.  . calcium carbonate (TUMS - DOSED IN MG ELEMENTAL CALCIUM) 500 MG chewable tablet Chew 1 tablet by mouth 2 (two) times daily as needed for indigestion or heartburn.   . cholecalciferol (VITAMIN D) 1000 units tablet Take 1,000 Units by mouth daily.  Marland Kitchen diltiazem (CARDIZEM CD) 240 MG 24 hr capsule Take 240 mg by mouth daily.   . diphenhydramine-acetaminophen (TYLENOL PM) 25-500 MG TABS tablet Take 1 tablet by mouth at bedtime.   . Flax Oil-Fish Oil-Borage Oil (FISH OIL-FLAX OIL-BORAGE OIL) CAPS Take 1 capsule by mouth daily.  . hydrochlorothiazide (HYDRODIURIL) 25 MG tablet Take 25 mg by mouth daily.  Marland Kitchen lisinopril (PRINIVIL,ZESTRIL) 40 MG tablet Take 40 mg by mouth 2 (two) times daily.  Marland Kitchen LORazepam (ATIVAN) 1 MG tablet Take 1 mg by mouth 2 (two) times daily as needed for anxiety.   . meclizine (ANTIVERT) 25 MG tablet Take 25 mg by mouth as needed.   . Melatonin 10 MG TABS Take 10 mg by mouth at bedtime.  Marland Kitchen omeprazole (PRILOSEC) 20 MG capsule Take 20 mg by mouth 2 (two) times daily.  . potassium chloride SA (K-DUR,KLOR-CON) 20 MEQ tablet Take 20 mEq by mouth 2 (two) times daily.  Marland Kitchen saccharomyces boulardii (FLORASTOR) 250 MG capsule Take 1 capsule (250 mg total) by mouth 2 (two) times daily.  . simvastatin (ZOCOR) 10 MG  tablet Take 1 tablet (10 mg total) by mouth at bedtime.  Marland Kitchen terazosin (HYTRIN) 5 MG capsule Take 5 mg by mouth 2 (two) times daily.  Marland Kitchen venlafaxine XR (EFFEXOR-XR) 75 MG 24 hr capsule Take 75 mg by mouth daily.  . verapamil (CALAN-SR) 240 MG CR tablet Take 240 mg by mouth 2 (two) times daily.  . vitamin C (ASCORBIC ACID) 500 MG tablet Take 1,000 mg by mouth daily.   No facility-administered encounter medications on file as of 12/23/2017.    Past Medical History:  Diagnosis Date  .  Anxiety   . Cancer [facility]) diagnosed 18 years ago.  He remains in remission.    prostate  .    Marland Kitchen Essential hypertension, benign 06/20/2016  . GERD (gastroesophageal reflux disease)   . H/O echocardiogram 2016   normal  . Heart murmur    "leaking heart valve"  . High cholesterol 06/20/2016  . HOH (hard of hearing)    left  . Hypertension   . Poor historian   . PUD (peptic ulcer disease)   . Shortness of breath    Past Surgical History:  Procedure Laterality Date  . ESOPHAGOGASTRODUODENOSCOPY    . ESOPHAGOGASTRODUODENOSCOPY N/A 07/18/2016   Procedure: ESOPHAGOGASTRODUODENOSCOPY (EGD);  Surgeon: Rogene Houston, MD;  Location: [facility] ENDO SUITE;  Service: Endoscopy;  Laterality: N/A;  3:00-moved to 230 per Lelon Frohlich  . ORIF WRIST FRACTURE  12/19/2011   Procedure: OPEN REDUCTION INTERNAL FIXATION (ORIF) WRIST FRACTURE;  Surgeon: Carole Civil, MD;  Location: [facility] ORS;  Service: Orthopedics;  Laterality: Right;  . prostate cancer     Diagnosed in the 90s  . TONSILLECTOMY       Objective: Blood pressure 130/90, pulse 74, temperature 97.9 F (36.6 C), temperature source Oral, resp. rate 18, height 5\' 6"  (1.676 m), weight 200 lb (90.7 kg). Patient is alert and in no acute distress. He is using cane for ambulation.  He was able to move from chair to examination table by himself. Conjunctiva is pink. Sclera is nonicteric Oropharyngeal mucosa is normal. No neck masses or thyromegaly noted. Cardiac exam with regular rhythm normal S1 and S2. No murmur or gallop noted. Lungs are clear to auscultation. Abdomen is full.  Bowel sounds are normal.  No bruit noted.  On palpation abdomen is soft and nontender with organomegaly or masses. He has trace edema around left ankle.  Labs/studies Results:  WBC and hemoglobin as above. LFTs from 11/07/2017 Bilirubin 0.9, AP 49, AST 26, ALT 18 and albumin 3.1.  Multiple CT images reviewed including one from 11/09/2015 as well as from 11/04/2017 and  11/07/2017.  Wall thickening to descending colon extending to splenic flexure distal sigmoid colon and proximal sigmoid colon. Hepatic cyst also seen on study of 11/09/2015.  This is stable and does not need further work-up. Cholelithiasis and nephrolithiasis Sigmoid diverticulosis.    Assessment:  #1.  Self-limiting bout of left-sided colitis.  Patient has had full recovery.  GI pathogen panel and C. difficile testing was negative.  Based on distribution and presentation it appears he had ischemic colitis.  Infection less likely.  No further work-up unless his condition recurs.  #2.  Chronic GERD.  He also has a history of duodenal ulcer documented to have healed on EGD of January 2018.  He is doing well and does not need double dose PPI.  #3.  Asymptomatic cholelithiasis.  #4.  Anemia secondary to recent GI blood loss.  Patient is scheduled for blood work in  2 weeks.  Therefore will hold off doing any blood test today.   Plan:  Medication list updated. Florastor removed from the list. Decrease omeprazole to 20 mg p.o. every morning. Patient to have CBC in 2 weeks his primary care physician. Office visit as needed.

## 2018-01-09 ENCOUNTER — Other Ambulatory Visit: Payer: Self-pay

## 2018-01-26 DIAGNOSIS — R7301 Impaired fasting glucose: Secondary | ICD-10-CM | POA: Diagnosis not present

## 2018-01-26 DIAGNOSIS — I10 Essential (primary) hypertension: Secondary | ICD-10-CM | POA: Diagnosis not present

## 2018-01-26 DIAGNOSIS — N400 Enlarged prostate without lower urinary tract symptoms: Secondary | ICD-10-CM | POA: Diagnosis not present

## 2018-01-30 DIAGNOSIS — C61 Malignant neoplasm of prostate: Secondary | ICD-10-CM | POA: Diagnosis not present

## 2018-01-30 DIAGNOSIS — E782 Mixed hyperlipidemia: Secondary | ICD-10-CM | POA: Diagnosis not present

## 2018-01-30 DIAGNOSIS — N400 Enlarged prostate without lower urinary tract symptoms: Secondary | ICD-10-CM | POA: Diagnosis not present

## 2018-01-30 DIAGNOSIS — R7301 Impaired fasting glucose: Secondary | ICD-10-CM | POA: Diagnosis not present

## 2018-01-30 DIAGNOSIS — F330 Major depressive disorder, recurrent, mild: Secondary | ICD-10-CM | POA: Diagnosis not present

## 2018-01-30 DIAGNOSIS — Z6830 Body mass index (BMI) 30.0-30.9, adult: Secondary | ICD-10-CM | POA: Diagnosis not present

## 2018-01-30 DIAGNOSIS — I10 Essential (primary) hypertension: Secondary | ICD-10-CM | POA: Diagnosis not present

## 2018-01-30 DIAGNOSIS — K219 Gastro-esophageal reflux disease without esophagitis: Secondary | ICD-10-CM | POA: Diagnosis not present

## 2018-02-23 DIAGNOSIS — R42 Dizziness and giddiness: Secondary | ICD-10-CM | POA: Diagnosis not present

## 2018-02-23 DIAGNOSIS — K219 Gastro-esophageal reflux disease without esophagitis: Secondary | ICD-10-CM | POA: Diagnosis not present

## 2018-02-23 DIAGNOSIS — Z6830 Body mass index (BMI) 30.0-30.9, adult: Secondary | ICD-10-CM | POA: Diagnosis not present

## 2018-04-14 DIAGNOSIS — C61 Malignant neoplasm of prostate: Secondary | ICD-10-CM | POA: Diagnosis not present

## 2018-04-21 ENCOUNTER — Ambulatory Visit (INDEPENDENT_AMBULATORY_CARE_PROVIDER_SITE_OTHER): Payer: Medicare Other | Admitting: Urology

## 2018-04-21 DIAGNOSIS — N401 Enlarged prostate with lower urinary tract symptoms: Secondary | ICD-10-CM

## 2018-04-21 DIAGNOSIS — R3915 Urgency of urination: Secondary | ICD-10-CM | POA: Diagnosis not present

## 2018-04-21 DIAGNOSIS — C61 Malignant neoplasm of prostate: Secondary | ICD-10-CM

## 2018-04-29 ENCOUNTER — Other Ambulatory Visit: Payer: Self-pay

## 2018-06-06 ENCOUNTER — Encounter (HOSPITAL_COMMUNITY): Payer: Self-pay

## 2018-06-06 ENCOUNTER — Emergency Department (HOSPITAL_COMMUNITY)
Admission: EM | Admit: 2018-06-06 | Discharge: 2018-06-06 | Disposition: A | Discharge status: Home / Self Care | Payer: Medicare Other | Attending: Emergency Medicine | Admitting: Emergency Medicine

## 2018-06-06 ENCOUNTER — Other Ambulatory Visit: Payer: Self-pay

## 2018-06-06 ENCOUNTER — Emergency Department (HOSPITAL_COMMUNITY): Payer: Medicare Other

## 2018-06-06 DIAGNOSIS — Z7982 Long term (current) use of aspirin: Secondary | ICD-10-CM | POA: Diagnosis not present

## 2018-06-06 DIAGNOSIS — R0689 Other abnormalities of breathing: Secondary | ICD-10-CM | POA: Diagnosis not present

## 2018-06-06 DIAGNOSIS — R55 Syncope and collapse: Secondary | ICD-10-CM | POA: Insufficient documentation

## 2018-06-06 DIAGNOSIS — I10 Essential (primary) hypertension: Secondary | ICD-10-CM | POA: Diagnosis not present

## 2018-06-06 DIAGNOSIS — I959 Hypotension, unspecified: Secondary | ICD-10-CM | POA: Diagnosis not present

## 2018-06-06 DIAGNOSIS — E860 Dehydration: Secondary | ICD-10-CM | POA: Diagnosis not present

## 2018-06-06 DIAGNOSIS — E876 Hypokalemia: Secondary | ICD-10-CM | POA: Insufficient documentation

## 2018-06-06 DIAGNOSIS — Z79899 Other long term (current) drug therapy: Secondary | ICD-10-CM | POA: Diagnosis not present

## 2018-06-06 DIAGNOSIS — J9811 Atelectasis: Secondary | ICD-10-CM | POA: Diagnosis not present

## 2018-06-06 DIAGNOSIS — I4430 Unspecified atrioventricular block: Secondary | ICD-10-CM | POA: Diagnosis not present

## 2018-06-06 DIAGNOSIS — R9431 Abnormal electrocardiogram [ECG] [EKG]: Secondary | ICD-10-CM | POA: Diagnosis not present

## 2018-06-06 DIAGNOSIS — I440 Atrioventricular block, first degree: Secondary | ICD-10-CM | POA: Diagnosis not present

## 2018-06-06 LAB — COMPREHENSIVE METABOLIC PANEL
ALBUMIN: 3.8 g/dL (ref 3.5–5.0)
ALT: 17 U/L (ref 0–44)
ANION GAP: 8 (ref 5–15)
AST: 20 U/L (ref 15–41)
Alkaline Phosphatase: 51 U/L (ref 38–126)
BILIRUBIN TOTAL: 0.5 mg/dL (ref 0.3–1.2)
BUN: 21 mg/dL (ref 8–23)
CHLORIDE: 106 mmol/L (ref 98–111)
CO2: 27 mmol/L (ref 22–32)
Calcium: 8.9 mg/dL (ref 8.9–10.3)
Creatinine, Ser: 1.55 mg/dL — ABNORMAL HIGH (ref 0.61–1.24)
GFR calc Af Amer: 46 mL/min — ABNORMAL LOW (ref >60)
GFR, EST NON AFRICAN AMERICAN: 40 mL/min — AB (ref >60)
GLUCOSE: 147 mg/dL — AB (ref 70–99)
Potassium: 3.2 mmol/L — ABNORMAL LOW (ref 3.5–5.1)
Sodium: 141 mmol/L (ref 135–145)
TOTAL PROTEIN: 6.5 g/dL (ref 6.5–8.1)

## 2018-06-06 LAB — CBC WITH DIFFERENTIAL/PLATELET
ABS IMMATURE GRANULOCYTES: 0.12 10*3/uL — AB (ref 0.00–0.07)
BASOS ABS: 0 10*3/uL (ref 0.0–0.1)
BASOS PCT: 0 %
Eosinophils Absolute: 0 10*3/uL (ref 0.0–0.5)
Eosinophils Relative: 0 %
HCT: 43.4 % (ref 39.0–52.0)
HEMOGLOBIN: 13.7 g/dL (ref 13.0–17.0)
Immature Granulocytes: 1 %
LYMPHS PCT: 6 %
Lymphs Abs: 1.1 10*3/uL (ref 0.7–4.0)
MCH: 27 pg (ref 26.0–34.0)
MCHC: 31.6 g/dL (ref 30.0–36.0)
MCV: 85.4 fL (ref 80.0–100.0)
MONO ABS: 1.7 10*3/uL — AB (ref 0.1–1.0)
Monocytes Relative: 9 %
NEUTROS ABS: 16.4 10*3/uL — AB (ref 1.7–7.7)
NEUTROS PCT: 84 %
PLATELETS: 216 10*3/uL (ref 150–400)
RBC: 5.08 MIL/uL (ref 4.22–5.81)
RDW: 13.9 % (ref 11.5–15.5)
WBC: 19.4 10*3/uL — AB (ref 4.0–10.5)
nRBC: 0 % (ref 0.0–0.2)

## 2018-06-06 LAB — URINALYSIS, ROUTINE W REFLEX MICROSCOPIC
BILIRUBIN URINE: NEGATIVE
Bacteria, UA: NONE SEEN
GLUCOSE, UA: NEGATIVE mg/dL
HGB URINE DIPSTICK: NEGATIVE
KETONES UR: NEGATIVE mg/dL
LEUKOCYTES UA: NEGATIVE
NITRITE: NEGATIVE
PH: 5 (ref 5.0–8.0)
Protein, ur: 30 mg/dL — AB
SPECIFIC GRAVITY, URINE: 1.018 (ref 1.005–1.030)

## 2018-06-06 LAB — LACTIC ACID, PLASMA
Lactic Acid, Venous: 2 mmol/L (ref 0.5–1.9)
Lactic Acid, Venous: 1.6 mmol/L (ref 0.5–1.9)

## 2018-06-06 MED ORDER — ONDANSETRON HCL 4 MG/2ML IJ SOLN
4.0000 mg | Freq: Once | INTRAMUSCULAR | Status: AC
  Administered 2018-06-06: 4 mg via INTRAVENOUS
  Filled 2018-06-06: qty 2

## 2018-06-06 MED ORDER — POTASSIUM CHLORIDE CRYS ER 20 MEQ PO TBCR
40.0000 meq | EXTENDED_RELEASE_TABLET | Freq: Once | ORAL | Status: AC
  Administered 2018-06-06: 40 meq via ORAL
  Filled 2018-06-06: qty 2

## 2018-06-06 MED ORDER — LISINOPRIL 10 MG PO TABS
40.0000 mg | ORAL_TABLET | Freq: Once | ORAL | Status: AC
  Administered 2018-06-06: 40 mg via ORAL
  Filled 2018-06-06: qty 4

## 2018-06-06 MED ORDER — SODIUM CHLORIDE 0.9 % IV BOLUS
1000.0000 mL | Freq: Once | INTRAVENOUS | Status: AC
  Administered 2018-06-06: 1000 mL via INTRAVENOUS

## 2018-06-06 NOTE — ED Notes (Signed)
O2 up to 100% with O2 at 2 L

## 2018-06-06 NOTE — ED Notes (Signed)
Pt has had 2 large loose stools

## 2018-06-06 NOTE — Discharge Instructions (Addendum)
Follow-up with your doctor next week as scheduled for blood work and checkup.  Today you are mildly dehydrated with a somewhat low potassium.  Make sure you take all of your medicine as usual.  Try to drink some extra water for the next several days.  Also make sure you are eating 3 meals each day.  If you feel dizzy, lie down quickly.  Return here if your symptoms worsen for reevaluation.

## 2018-06-06 NOTE — ED Provider Notes (Signed)
[facility] EMERGENCY DEPARTMENT Provider Note   CSN: [id number] Arrival date & time: 06/06/18  1731     History   Chief Complaint Chief Complaint  Patient presents with  . Loss of Consciousness    HPI Jeffrey Sherman is a 84 y.o. male.  HPI  Patient presents for evaluation of near syncope which occurred while he was sitting on a commode at home today.  He felt nauseated and dizzy so he stood up and tried to get to his bed.  He fell on the floor and does not feel he passed out.  He was able to pound on the floor to get his son's attention who arrived and called EMS to transfer him here.  Patient reports having this happen multiple times, but not recently.  He denies injury today.  He was able to eat both breakfast and lunch, before the incident.  No other illnesses recently including vomiting, diarrhea, dysuria, cough or shortness of breath.  There are no other known modifying factors.   Past Medical History:  Diagnosis Date  . Anxiety   . Cancer [facility])    prostate  . Chronic chest wall pain   . Essential hypertension, benign 06/20/2016  . GERD (gastroesophageal reflux disease)   . H/O echocardiogram 2016   normal  . Heart murmur    "leaking heart valve"  . High cholesterol 06/20/2016  . HOH (hard of hearing)    left  . Hypertension   . Poor historian   . PUD (peptic ulcer disease)   . Shortness of breath     Patient Active Problem List   Diagnosis Date Noted  . Infectious colitis   . Anemia   . Leukocytosis   . Rectal bleeding 11/06/2017  . Abdominal pain 11/06/2017  . Hypokalemia 11/06/2017  . Renal insufficiency 11/06/2017  . Hyperglycemia 11/06/2017  . Essential hypertension, benign 06/20/2016  . High cholesterol 06/20/2016  . Gastroesophageal reflux disease without esophagitis 06/20/2016  . Dyspnea 12/17/2013  . Constipation 12/16/2011  . Wrist fracture 12/16/2011    Past Surgical History:  Procedure Laterality Date  . ESOPHAGOGASTRODUODENOSCOPY      . ESOPHAGOGASTRODUODENOSCOPY N/A 07/18/2016   Procedure: ESOPHAGOGASTRODUODENOSCOPY (EGD);  Surgeon: Rogene Houston, MD;  Location: [facility] ENDO SUITE;  Service: Endoscopy;  Laterality: N/A;  3:00-moved to 230 per Lelon Frohlich  . ORIF WRIST FRACTURE  12/19/2011   Procedure: OPEN REDUCTION INTERNAL FIXATION (ORIF) WRIST FRACTURE;  Surgeon: Carole Civil, MD;  Location: [facility] ORS;  Service: Orthopedics;  Laterality: Right;  . prostate cancer     Diagnosed in the 90s  . TONSILLECTOMY          Home Medications    Prior to Admission medications   Medication Sig Start Date End Date Taking? Authorizing Provider  alfuzosin (UROXATRAL) 10 MG 24 hr tablet Take 10 mg by mouth daily with breakfast.   Yes [provider]  aspirin EC 81 MG tablet Take 81 mg by mouth every morning.   Yes [provider]  calcium carbonate (TUMS - DOSED IN MG ELEMENTAL CALCIUM) 500 MG chewable tablet Chew 1 tablet by mouth 2 (two) times daily as needed for indigestion or heartburn.    Yes [provider]  cholecalciferol (VITAMIN D) 1000 units tablet Take 1,000 Units by mouth daily.   Yes [provider]  diltiazem (CARDIZEM CD) 240 MG 24 hr capsule Take 240 mg by mouth daily.  11/28/17  Yes [provider]  diphenhydramine-acetaminophen (TYLENOL PM) 25-500  MG TABS tablet Take 1 tablet by mouth at bedtime.    Yes [provider]  Flax Oil-Fish Oil-Borage Oil (FISH OIL-FLAX OIL-BORAGE OIL) CAPS Take 1 capsule by mouth daily.   Yes [provider]  hydrochlorothiazide (HYDRODIURIL) 25 MG tablet Take 25 mg by mouth daily.   Yes [provider]  lisinopril (PRINIVIL,ZESTRIL) 40 MG tablet Take 40 mg by mouth 2 (two) times daily.   Yes [provider]  LORazepam (ATIVAN) 1 MG tablet Take 1 mg by mouth 2 (two) times daily as needed for anxiety.    Yes [provider]  meclizine (ANTIVERT) 25 MG tablet Take 25 mg by mouth as needed.  11/14/17  Yes [provider]  Melatonin 10 MG TABS Take 10 mg by mouth at bedtime.   Yes [provider]  omeprazole (PRILOSEC) 20 MG capsule Take 1 capsule (20 mg total) by mouth daily before breakfast. Patient taking differently: Take 40 mg by mouth daily before breakfast.  12/23/17  Yes Rehman, Mechele Dawley, MD  potassium chloride SA (K-DUR,KLOR-CON) 20 MEQ tablet Take 20 mEq by mouth 2 (two) times daily.   Yes [provider]  simvastatin (ZOCOR) 10 MG tablet Take 1 tablet (10 mg total) by mouth at bedtime. 11/23/17  Yes Barton Dubois, MD  terazosin (HYTRIN) 5 MG capsule Take 5 mg by mouth 2 (two) times daily. 09/14/17  Yes [provider]  venlafaxine XR (EFFEXOR-XR) 75 MG 24 hr capsule Take 75 mg by mouth daily.   Yes [provider]  vitamin C (ASCORBIC ACID) 500 MG tablet Take 1,000 mg by mouth daily.   Yes [provider]    Family History Family History  Problem Relation Age of Onset  . Cancer Other   . Heart attack Mother   . Cancer Brother   . Heart attack Brother   . Heart disease Sister        by pass    Social History Social History   Tobacco Use  . Smoking status: Never Smoker  . Smokeless tobacco: Never Used  Substance Use Topics  . Alcohol use: No  . Drug use: No     Allergies   Patient has no known allergies.   Review of Systems Review of Systems  All other systems reviewed and are negative.    Physical Exam Updated Vital Signs BP (!) 196/82   Pulse 60   Temp 97.7 F (36.5 C) (Rectal)   Resp 15   Wt 88.5 kg   SpO2 100%   BMI 31.47 kg/m   Physical Exam Vitals signs and nursing note reviewed.  Constitutional:      General: He is not in acute distress.    Appearance: Normal appearance. He is well-developed. He is obese. He is not ill-appearing or diaphoretic.  HENT:     Head: Normocephalic and atraumatic.     Right Ear: External ear normal.     Left Ear: External ear normal.  Eyes:     Conjunctiva/sclera:  Conjunctivae normal.     Pupils: Pupils are equal, round, and reactive to light.  Neck:     Musculoskeletal: Normal range of motion and neck supple. No neck rigidity.     Trachea: Phonation normal.  Cardiovascular:     Rate and Rhythm: Normal rate and regular rhythm.     Heart sounds: Normal heart sounds.  Pulmonary:     Effort: Pulmonary effort is normal.     Breath sounds: Normal breath  sounds. No stridor. No rhonchi.  Abdominal:     Palpations: Abdomen is soft.     Tenderness: There is no abdominal tenderness.  Musculoskeletal: Normal range of motion.        General: No swelling, tenderness or signs of injury.  Skin:    General: Skin is warm and dry.  Neurological:     Mental Status: He is alert and oriented to person, place, and time.     Cranial Nerves: No cranial nerve deficit.     Sensory: No sensory deficit.     Motor: No abnormal muscle tone.     Coordination: Coordination normal.  Psychiatric:        Behavior: Behavior normal.        Thought Content: Thought content normal.        Judgment: Judgment normal.      ED Treatments / Results  Labs (all labs ordered are listed, but only abnormal results are displayed) Labs Reviewed  COMPREHENSIVE METABOLIC PANEL - Abnormal; Notable for the following components:      Result Value   Potassium 3.2 (*)    Glucose, Bld 147 (*)    Creatinine, Ser 1.55 (*)    GFR calc non Af Amer 40 (*)    GFR calc Af Amer 46 (*)    All other components within normal limits  CBC WITH DIFFERENTIAL/PLATELET - Abnormal; Notable for the following components:   WBC 19.4 (*)    Neutro Abs 16.4 (*)    Monocytes Absolute 1.7 (*)    Abs Immature Granulocytes 0.12 (*)    All other components within normal limits  URINALYSIS, ROUTINE W REFLEX MICROSCOPIC - Abnormal; Notable for the following components:   APPearance HAZY (*)    Protein, ur 30 (*)    All other components within normal limits  LACTIC ACID, PLASMA - Abnormal; Notable for the  following components:   Lactic Acid, Venous 2.0 (*)    All other components within normal limits  CULTURE, BLOOD (ROUTINE X 2)  CULTURE, BLOOD (ROUTINE X 2)  LACTIC ACID, PLASMA    EKG EKG Interpretation  Date/Time:  Saturday June 06 2018 17:50:04 EST Ventricular Rate:  67 PR Interval:    QRS Duration: 92 QT Interval:  513 QTC Calculation: 542 R Axis:   -41 Text Interpretation:  Sinus rhythm Prolonged PR interval Left axis deviation Abnormal R-wave progression, late transition Abnormal inferior Q waves Nonspecific T abnrm, anterolateral leads Prolonged QT interval Since last tracing QT has lengthened Confirmed by Daleen Bo [phone]) on 06/06/2018 8:57:52 PM   Radiology Dg Chest Port 1 View  Result Date: 06/06/2018 CLINICAL DATA:  Acute onset of syncope. Decreased blood pressure. EXAM: PORTABLE CHEST 1 VIEW COMPARISON:  Chest radiograph performed 03/17/2017 FINDINGS: The lungs are well-aerated. Mild vascular congestion is noted. Minimal bibasilar atelectasis is seen. There is no evidence of pleural effusion or pneumothorax. The cardiomediastinal silhouette is within normal limits. No acute osseous abnormalities are seen. IMPRESSION: Mild vascular congestion noted. Minimal bibasilar atelectasis seen. Electronically Signed   By: Garald Balding M.D.   On: 06/06/2018 21:58    Procedures Procedures (including critical care time)  Medications Ordered in ED Medications  sodium chloride 0.9 % bolus 1,000 mL (0 mLs Intravenous Stopped 06/06/18 1915)  ondansetron (ZOFRAN) injection 4 mg (4 mg Intravenous Given 06/06/18 1915)  lisinopril (PRINIVIL,ZESTRIL) tablet 40 mg (40 mg Oral Given 06/06/18 2143)  potassium chloride SA (K-DUR,KLOR-CON) CR tablet 40 mEq (40 mEq Oral Given 06/06/18 2143)  Initial Impression / Assessment and Plan / ED Course  I have reviewed the triage vital signs and the nursing notes.  Pertinent labs & imaging results that were available during my care of  the patient were reviewed by me and considered in my medical decision making (see chart for details).  Clinical Course as of Jun 06 2202  Sat Jun 06, 2018  2110 Normal except potassium low, glucose high, creatinine high, GFR low  Comprehensive metabolic panel(!) [EW]  4287 Normal except white count high  CBC with Differential(!) [EW]  2111 Normal except protein on dipstick  Urinalysis, Routine w reflex microscopic(!) [EW]  2154 No infiltrate or CHF, images reviewed by me.  DG Chest Port 1 View [EW]    Clinical Course User Index [EW] Daleen Bo, MD     Patient Vitals for the past 24 hrs:  BP Temp Temp src Pulse Resp SpO2 Weight  06/06/18 2030 (!) 196/82 - - 60 15 - -  06/06/18 2000 (!) 176/68 - - 67 15 - -  06/06/18 1945 - - - 64 15 - -  06/06/18 1930 (!) 160/72 - - 60 12 - -  06/06/18 1900 (!) 175/78 - - (!) 55 11 - -  06/06/18 1840 - - - 61 12 - -  06/06/18 1839 (!) 168/70 - - 67 15 100 % -  06/06/18 1830 (!) 168/70 - - - - - -  06/06/18 1819 130/81 - - - - 100 % -  06/06/18 1815 - 97.7 F (36.5 C) Rectal - - - -  06/06/18 1754 - - - 60 12 - -  06/06/18 1740 138/88 98.1 F (36.7 C) Oral 80 11 (!) 76 % -  06/06/18 1738 - - - - - - 88.5 kg    9:28 PM Reevaluation with update and discussion. After initial assessment and treatment, an updated evaluation reveals he is feeling better at this time no longer nauseous.  He did not take his evening blood pressure medicine.  Typically takes lisinopril in the evenings.  Patient and family updated on findings and plan.  They initially stated that the patient want to stay in the hospital, but if he cannot be fully admitted he does not want to stay.  I advised that he appeared to be a reasonable observation candidate, but not a admission candidate at this time. Daleen Bo   Medical Decision Making: Syncope versus syncope associated with attempt to have a bowel movement.  Patient improved on arrival after treatment.  Elevated white  count but no focus for infection.  No fever.  SIRS negative with reassuring blood pressure.  Lactate is borderline elevated.  Doubt sepsis.  Doubt metabolic instability.  Mild elevation in creatinine, patient has not been vomiting.  No significant preceding symptoms prior to the near syncopal episode.    CRITICAL CARE-no Performed by: Daleen Bo  Nursing Notes Reviewed/ Care Coordinated Applicable Imaging Reviewed Interpretation of Laboratory Data incorporated into ED treatment  The patient appears reasonably screened and/or stabilized for discharge and I doubt any other medical condition or other [facility] requiring further screening, evaluation, or treatment in the ED at this time prior to discharge.  Plan: Home Medications-continue usual medications; Home Treatments-rest, fluids, increase daily fluid intake; return here if the recommended treatment, does not improve the symptoms; Recommended follow up-PCP checkup next week as scheduled for blood work and further evaluation as needed.   Final Clinical Impressions(s) / ED Diagnoses   Final diagnoses:  Near syncope  Hypokalemia  Dehydration    ED Discharge Orders    None       Daleen Bo, MD 06/06/18 2203

## 2018-06-06 NOTE — ED Notes (Signed)
Pt. Was able to ambulate with no assistance on room air without stumbling or any trouble breathing.

## 2018-06-06 NOTE — ED Triage Notes (Signed)
Pt was ambulating to BR due to feeling nauseated   had syncopal episode. Pt was alert upon EMS arrival. No complaints of pain. Pt was clammy at time. Bp dropped to 72/38 and fluids initiated and placed in trendelenburg. Pt is alert and oriented at this time and requested bedpan upon arrival due to loose stools

## 2018-06-11 LAB — CULTURE, BLOOD (ROUTINE X 2)
CULTURE: NO GROWTH
Culture: NO GROWTH
SPECIAL REQUESTS: ADEQUATE
SPECIAL REQUESTS: ADEQUATE

## 2018-06-12 DIAGNOSIS — E782 Mixed hyperlipidemia: Secondary | ICD-10-CM | POA: Diagnosis not present

## 2018-06-12 DIAGNOSIS — N400 Enlarged prostate without lower urinary tract symptoms: Secondary | ICD-10-CM | POA: Diagnosis not present

## 2018-06-12 DIAGNOSIS — I10 Essential (primary) hypertension: Secondary | ICD-10-CM | POA: Diagnosis not present

## 2018-06-12 DIAGNOSIS — F330 Major depressive disorder, recurrent, mild: Secondary | ICD-10-CM | POA: Diagnosis not present

## 2018-06-12 DIAGNOSIS — R7301 Impaired fasting glucose: Secondary | ICD-10-CM | POA: Diagnosis not present

## 2018-06-16 DIAGNOSIS — I10 Essential (primary) hypertension: Secondary | ICD-10-CM | POA: Diagnosis not present

## 2018-06-16 DIAGNOSIS — E782 Mixed hyperlipidemia: Secondary | ICD-10-CM | POA: Diagnosis not present

## 2018-06-16 DIAGNOSIS — N401 Enlarged prostate with lower urinary tract symptoms: Secondary | ICD-10-CM | POA: Diagnosis not present

## 2018-06-16 DIAGNOSIS — F331 Major depressive disorder, recurrent, moderate: Secondary | ICD-10-CM | POA: Diagnosis not present

## 2018-06-16 DIAGNOSIS — K219 Gastro-esophageal reflux disease without esophagitis: Secondary | ICD-10-CM | POA: Diagnosis not present

## 2018-06-16 DIAGNOSIS — R55 Syncope and collapse: Secondary | ICD-10-CM | POA: Diagnosis not present

## 2018-06-16 DIAGNOSIS — R7301 Impaired fasting glucose: Secondary | ICD-10-CM | POA: Diagnosis not present

## 2018-07-03 DIAGNOSIS — F331 Major depressive disorder, recurrent, moderate: Secondary | ICD-10-CM | POA: Diagnosis not present

## 2018-07-03 DIAGNOSIS — R42 Dizziness and giddiness: Secondary | ICD-10-CM | POA: Diagnosis not present

## 2018-07-03 DIAGNOSIS — N400 Enlarged prostate without lower urinary tract symptoms: Secondary | ICD-10-CM | POA: Diagnosis not present

## 2018-07-03 DIAGNOSIS — I10 Essential (primary) hypertension: Secondary | ICD-10-CM | POA: Diagnosis not present

## 2018-07-03 DIAGNOSIS — C61 Malignant neoplasm of prostate: Secondary | ICD-10-CM | POA: Diagnosis not present

## 2018-08-03 DIAGNOSIS — I10 Essential (primary) hypertension: Secondary | ICD-10-CM | POA: Diagnosis not present

## 2018-08-03 DIAGNOSIS — R42 Dizziness and giddiness: Secondary | ICD-10-CM | POA: Diagnosis not present

## 2018-11-04 DIAGNOSIS — Z0000 Encounter for general adult medical examination without abnormal findings: Secondary | ICD-10-CM | POA: Diagnosis not present

## 2018-12-23 DIAGNOSIS — E782 Mixed hyperlipidemia: Secondary | ICD-10-CM | POA: Diagnosis not present

## 2018-12-23 DIAGNOSIS — R7301 Impaired fasting glucose: Secondary | ICD-10-CM | POA: Diagnosis not present

## 2018-12-23 DIAGNOSIS — I10 Essential (primary) hypertension: Secondary | ICD-10-CM | POA: Diagnosis not present

## 2018-12-25 DIAGNOSIS — I129 Hypertensive chronic kidney disease with stage 1 through stage 4 chronic kidney disease, or unspecified chronic kidney disease: Secondary | ICD-10-CM | POA: Diagnosis not present

## 2018-12-25 DIAGNOSIS — Z8546 Personal history of malignant neoplasm of prostate: Secondary | ICD-10-CM | POA: Diagnosis not present

## 2018-12-25 DIAGNOSIS — N401 Enlarged prostate with lower urinary tract symptoms: Secondary | ICD-10-CM | POA: Diagnosis not present

## 2018-12-25 DIAGNOSIS — R7301 Impaired fasting glucose: Secondary | ICD-10-CM | POA: Diagnosis not present

## 2018-12-25 DIAGNOSIS — F331 Major depressive disorder, recurrent, moderate: Secondary | ICD-10-CM | POA: Diagnosis not present

## 2018-12-25 DIAGNOSIS — K219 Gastro-esophageal reflux disease without esophagitis: Secondary | ICD-10-CM | POA: Diagnosis not present

## 2018-12-25 DIAGNOSIS — E782 Mixed hyperlipidemia: Secondary | ICD-10-CM | POA: Diagnosis not present

## 2018-12-25 DIAGNOSIS — R079 Chest pain, unspecified: Secondary | ICD-10-CM | POA: Diagnosis not present

## 2018-12-25 DIAGNOSIS — E876 Hypokalemia: Secondary | ICD-10-CM | POA: Diagnosis not present

## 2018-12-25 DIAGNOSIS — N183 Chronic kidney disease, stage 3 (moderate): Secondary | ICD-10-CM | POA: Diagnosis not present

## 2019-01-06 ENCOUNTER — Other Ambulatory Visit: Payer: Medicare Other

## 2019-01-06 ENCOUNTER — Other Ambulatory Visit: Payer: Self-pay

## 2019-01-06 DIAGNOSIS — R6889 Other general symptoms and signs: Secondary | ICD-10-CM | POA: Diagnosis not present

## 2019-01-06 DIAGNOSIS — Z20822 Contact with and (suspected) exposure to covid-19: Secondary | ICD-10-CM

## 2019-01-08 LAB — NOVEL CORONAVIRUS, NAA: SARS-CoV-2, NAA: NOT DETECTED

## 2019-03-15 DIAGNOSIS — C61 Malignant neoplasm of prostate: Secondary | ICD-10-CM | POA: Diagnosis not present

## 2019-03-23 ENCOUNTER — Other Ambulatory Visit: Payer: Self-pay

## 2019-03-23 ENCOUNTER — Ambulatory Visit (INDEPENDENT_AMBULATORY_CARE_PROVIDER_SITE_OTHER): Payer: Medicare Other | Admitting: Urology

## 2019-03-23 DIAGNOSIS — C61 Malignant neoplasm of prostate: Secondary | ICD-10-CM

## 2019-03-23 DIAGNOSIS — N401 Enlarged prostate with lower urinary tract symptoms: Secondary | ICD-10-CM | POA: Diagnosis not present

## 2019-03-23 DIAGNOSIS — R9721 Rising PSA following treatment for malignant neoplasm of prostate: Secondary | ICD-10-CM | POA: Diagnosis not present

## 2019-03-25 DIAGNOSIS — N400 Enlarged prostate without lower urinary tract symptoms: Secondary | ICD-10-CM | POA: Diagnosis not present

## 2019-03-25 DIAGNOSIS — F331 Major depressive disorder, recurrent, moderate: Secondary | ICD-10-CM | POA: Diagnosis not present

## 2019-03-25 DIAGNOSIS — Z23 Encounter for immunization: Secondary | ICD-10-CM | POA: Diagnosis not present

## 2019-03-25 DIAGNOSIS — I10 Essential (primary) hypertension: Secondary | ICD-10-CM | POA: Diagnosis not present

## 2019-03-25 DIAGNOSIS — C61 Malignant neoplasm of prostate: Secondary | ICD-10-CM | POA: Diagnosis not present

## 2019-04-21 ENCOUNTER — Ambulatory Visit (HOSPITAL_COMMUNITY)
Admission: RE | Admit: 2019-04-21 | Discharge: 2019-04-21 | Disposition: A | Discharge status: Home / Self Care | Payer: Medicare Other | Source: Ambulatory Visit | Attending: Internal Medicine | Admitting: Internal Medicine

## 2019-04-21 ENCOUNTER — Other Ambulatory Visit: Payer: Self-pay

## 2019-04-21 ENCOUNTER — Other Ambulatory Visit (HOSPITAL_COMMUNITY): Payer: Self-pay | Admitting: Internal Medicine

## 2019-04-21 ENCOUNTER — Other Ambulatory Visit: Payer: Self-pay | Admitting: Internal Medicine

## 2019-04-21 DIAGNOSIS — R42 Dizziness and giddiness: Secondary | ICD-10-CM | POA: Diagnosis not present

## 2019-04-21 DIAGNOSIS — R1011 Right upper quadrant pain: Secondary | ICD-10-CM | POA: Insufficient documentation

## 2019-04-21 DIAGNOSIS — N401 Enlarged prostate with lower urinary tract symptoms: Secondary | ICD-10-CM | POA: Diagnosis not present

## 2019-04-21 DIAGNOSIS — R079 Chest pain, unspecified: Secondary | ICD-10-CM | POA: Diagnosis not present

## 2019-04-21 DIAGNOSIS — N183 Chronic kidney disease, stage 3 unspecified: Secondary | ICD-10-CM | POA: Diagnosis not present

## 2019-04-21 DIAGNOSIS — K5781 Diverticulitis of intestine, part unspecified, with perforation and abscess with bleeding: Secondary | ICD-10-CM | POA: Diagnosis not present

## 2019-04-21 DIAGNOSIS — R109 Unspecified abdominal pain: Secondary | ICD-10-CM | POA: Diagnosis not present

## 2019-04-21 DIAGNOSIS — Z8546 Personal history of malignant neoplasm of prostate: Secondary | ICD-10-CM | POA: Diagnosis not present

## 2019-04-21 DIAGNOSIS — I10 Essential (primary) hypertension: Secondary | ICD-10-CM | POA: Diagnosis not present

## 2019-04-21 DIAGNOSIS — Z8719 Personal history of other diseases of the digestive system: Secondary | ICD-10-CM

## 2019-04-21 DIAGNOSIS — I129 Hypertensive chronic kidney disease with stage 1 through stage 4 chronic kidney disease, or unspecified chronic kidney disease: Secondary | ICD-10-CM | POA: Diagnosis not present

## 2019-04-21 DIAGNOSIS — K219 Gastro-esophageal reflux disease without esophagitis: Secondary | ICD-10-CM | POA: Diagnosis not present

## 2019-04-21 DIAGNOSIS — Z6830 Body mass index (BMI) 30.0-30.9, adult: Secondary | ICD-10-CM | POA: Diagnosis not present

## 2019-04-21 DIAGNOSIS — K8020 Calculus of gallbladder without cholecystitis without obstruction: Secondary | ICD-10-CM | POA: Diagnosis not present

## 2019-04-21 DIAGNOSIS — F419 Anxiety disorder, unspecified: Secondary | ICD-10-CM | POA: Diagnosis not present

## 2019-04-21 DIAGNOSIS — E876 Hypokalemia: Secondary | ICD-10-CM | POA: Diagnosis not present

## 2019-04-21 DIAGNOSIS — N400 Enlarged prostate without lower urinary tract symptoms: Secondary | ICD-10-CM | POA: Diagnosis not present

## 2019-05-05 ENCOUNTER — Other Ambulatory Visit: Payer: Self-pay

## 2019-05-17 DIAGNOSIS — N183 Chronic kidney disease, stage 3 unspecified: Secondary | ICD-10-CM | POA: Diagnosis not present

## 2019-05-17 DIAGNOSIS — E782 Mixed hyperlipidemia: Secondary | ICD-10-CM | POA: Diagnosis not present

## 2019-05-17 DIAGNOSIS — I10 Essential (primary) hypertension: Secondary | ICD-10-CM | POA: Diagnosis not present

## 2019-05-17 DIAGNOSIS — R7301 Impaired fasting glucose: Secondary | ICD-10-CM | POA: Diagnosis not present

## 2019-05-21 DIAGNOSIS — I10 Essential (primary) hypertension: Secondary | ICD-10-CM | POA: Diagnosis not present

## 2019-05-21 DIAGNOSIS — F331 Major depressive disorder, recurrent, moderate: Secondary | ICD-10-CM | POA: Diagnosis not present

## 2019-05-21 DIAGNOSIS — E875 Hyperkalemia: Secondary | ICD-10-CM | POA: Diagnosis not present

## 2019-05-21 DIAGNOSIS — E782 Mixed hyperlipidemia: Secondary | ICD-10-CM | POA: Diagnosis not present

## 2019-05-21 DIAGNOSIS — N401 Enlarged prostate with lower urinary tract symptoms: Secondary | ICD-10-CM | POA: Diagnosis not present

## 2019-05-21 DIAGNOSIS — K219 Gastro-esophageal reflux disease without esophagitis: Secondary | ICD-10-CM | POA: Diagnosis not present

## 2019-05-21 DIAGNOSIS — R7301 Impaired fasting glucose: Secondary | ICD-10-CM | POA: Diagnosis not present

## 2019-05-21 DIAGNOSIS — I714 Abdominal aortic aneurysm, without rupture: Secondary | ICD-10-CM | POA: Diagnosis not present

## 2019-06-17 ENCOUNTER — Other Ambulatory Visit: Payer: Self-pay

## 2019-06-17 DIAGNOSIS — C61 Malignant neoplasm of prostate: Secondary | ICD-10-CM

## 2019-06-17 NOTE — Addendum Note (Signed)
Addended by: Dorisann Frames on: 06/17/2019 01:11 PM   Modules accepted: Orders

## 2019-06-22 DIAGNOSIS — Z23 Encounter for immunization: Secondary | ICD-10-CM | POA: Diagnosis not present

## 2019-06-30 DIAGNOSIS — Z8546 Personal history of malignant neoplasm of prostate: Secondary | ICD-10-CM | POA: Diagnosis not present

## 2019-06-30 DIAGNOSIS — N401 Enlarged prostate with lower urinary tract symptoms: Secondary | ICD-10-CM | POA: Diagnosis not present

## 2019-06-30 DIAGNOSIS — I129 Hypertensive chronic kidney disease with stage 1 through stage 4 chronic kidney disease, or unspecified chronic kidney disease: Secondary | ICD-10-CM | POA: Diagnosis not present

## 2019-06-30 DIAGNOSIS — K219 Gastro-esophageal reflux disease without esophagitis: Secondary | ICD-10-CM | POA: Diagnosis not present

## 2019-06-30 DIAGNOSIS — N183 Chronic kidney disease, stage 3 unspecified: Secondary | ICD-10-CM | POA: Diagnosis not present

## 2019-07-19 DIAGNOSIS — R2689 Other abnormalities of gait and mobility: Secondary | ICD-10-CM | POA: Diagnosis not present

## 2019-08-01 DIAGNOSIS — C61 Malignant neoplasm of prostate: Secondary | ICD-10-CM | POA: Diagnosis not present

## 2019-08-01 DIAGNOSIS — N400 Enlarged prostate without lower urinary tract symptoms: Secondary | ICD-10-CM | POA: Diagnosis not present

## 2019-08-01 DIAGNOSIS — F331 Major depressive disorder, recurrent, moderate: Secondary | ICD-10-CM | POA: Diagnosis not present

## 2019-08-01 DIAGNOSIS — I10 Essential (primary) hypertension: Secondary | ICD-10-CM | POA: Diagnosis not present

## 2019-08-02 DIAGNOSIS — Z23 Encounter for immunization: Secondary | ICD-10-CM | POA: Diagnosis not present

## 2019-08-17 DIAGNOSIS — Z6830 Body mass index (BMI) 30.0-30.9, adult: Secondary | ICD-10-CM | POA: Diagnosis not present

## 2019-08-17 DIAGNOSIS — R079 Chest pain, unspecified: Secondary | ICD-10-CM | POA: Diagnosis not present

## 2019-08-17 DIAGNOSIS — R109 Unspecified abdominal pain: Secondary | ICD-10-CM | POA: Diagnosis not present

## 2019-08-17 DIAGNOSIS — K219 Gastro-esophageal reflux disease without esophagitis: Secondary | ICD-10-CM | POA: Diagnosis not present

## 2019-08-17 DIAGNOSIS — N183 Chronic kidney disease, stage 3 unspecified: Secondary | ICD-10-CM | POA: Diagnosis not present

## 2019-08-17 DIAGNOSIS — Z8546 Personal history of malignant neoplasm of prostate: Secondary | ICD-10-CM | POA: Diagnosis not present

## 2019-08-17 DIAGNOSIS — I129 Hypertensive chronic kidney disease with stage 1 through stage 4 chronic kidney disease, or unspecified chronic kidney disease: Secondary | ICD-10-CM | POA: Diagnosis not present

## 2019-08-17 DIAGNOSIS — N400 Enlarged prostate without lower urinary tract symptoms: Secondary | ICD-10-CM | POA: Diagnosis not present

## 2019-08-17 DIAGNOSIS — R42 Dizziness and giddiness: Secondary | ICD-10-CM | POA: Diagnosis not present

## 2019-08-17 DIAGNOSIS — R10816 Epigastric abdominal tenderness: Secondary | ICD-10-CM | POA: Diagnosis not present

## 2019-08-17 DIAGNOSIS — E876 Hypokalemia: Secondary | ICD-10-CM | POA: Diagnosis not present

## 2019-08-17 DIAGNOSIS — I714 Abdominal aortic aneurysm, without rupture: Secondary | ICD-10-CM | POA: Diagnosis not present

## 2019-08-17 DIAGNOSIS — I10 Essential (primary) hypertension: Secondary | ICD-10-CM | POA: Diagnosis not present

## 2019-08-17 DIAGNOSIS — F419 Anxiety disorder, unspecified: Secondary | ICD-10-CM | POA: Diagnosis not present

## 2019-08-19 ENCOUNTER — Other Ambulatory Visit (HOSPITAL_COMMUNITY): Payer: Medicare Other

## 2019-08-25 ENCOUNTER — Other Ambulatory Visit: Payer: Self-pay | Admitting: Urology

## 2019-08-25 DIAGNOSIS — C61 Malignant neoplasm of prostate: Secondary | ICD-10-CM | POA: Diagnosis not present

## 2019-08-26 LAB — BUN: BUN: 20 mg/dL (ref 7–25)

## 2019-08-26 LAB — CREATININE, SERUM: Creat: 1.18 mg/dL — ABNORMAL HIGH (ref 0.70–1.11)

## 2019-08-26 LAB — PSA: PSA: 28.7 ng/mL — ABNORMAL HIGH (ref <=4.0)

## 2019-08-30 ENCOUNTER — Other Ambulatory Visit: Payer: Self-pay | Admitting: Urology

## 2019-08-31 DIAGNOSIS — R10816 Epigastric abdominal tenderness: Secondary | ICD-10-CM | POA: Diagnosis not present

## 2019-08-31 DIAGNOSIS — K219 Gastro-esophageal reflux disease without esophagitis: Secondary | ICD-10-CM | POA: Diagnosis not present

## 2019-09-15 DIAGNOSIS — R21 Rash and other nonspecific skin eruption: Secondary | ICD-10-CM | POA: Diagnosis not present

## 2019-09-28 DIAGNOSIS — R21 Rash and other nonspecific skin eruption: Secondary | ICD-10-CM | POA: Diagnosis not present

## 2019-10-15 DIAGNOSIS — H31001 Unspecified chorioretinal scars, right eye: Secondary | ICD-10-CM | POA: Diagnosis not present

## 2019-10-19 ENCOUNTER — Encounter: Payer: Self-pay | Admitting: Urology

## 2019-10-19 ENCOUNTER — Other Ambulatory Visit: Payer: Self-pay

## 2019-10-19 ENCOUNTER — Ambulatory Visit (INDEPENDENT_AMBULATORY_CARE_PROVIDER_SITE_OTHER): Payer: Medicare Other | Admitting: Urology

## 2019-10-19 VITALS — BP 164/81 | HR 64 | Temp 97.3°F | Ht 69.0 in | Wt 195.0 lb

## 2019-10-19 DIAGNOSIS — C61 Malignant neoplasm of prostate: Secondary | ICD-10-CM

## 2019-10-19 LAB — POCT URINALYSIS DIPSTICK
Blood, UA: NEGATIVE
Glucose, UA: NEGATIVE
Ketones, UA: NEGATIVE
Leukocytes, UA: NEGATIVE
Nitrite, UA: NEGATIVE
Protein, UA: POSITIVE — AB
Spec Grav, UA: 1.015 (ref 1.010–1.025)
Urobilinogen, UA: NEGATIVE E.U./dL — AB
pH, UA: 5 (ref 5.0–8.0)

## 2019-10-19 LAB — BLADDER SCAN AMB NON-IMAGING: Scan Result: 55.5

## 2019-10-19 MED ORDER — LEUPROLIDE ACETATE (4 MONTH) 30 MG ~~LOC~~ KIT
30.0000 mg | PACK | Freq: Once | SUBCUTANEOUS | Status: AC
  Administered 2019-10-19: 30 mg via SUBCUTANEOUS

## 2019-10-19 NOTE — Progress Notes (Signed)
See progress note.

## 2019-10-19 NOTE — Progress Notes (Signed)
H&P  Chief Complaint: Follow-up of prostate cancer  History of Present Illness:   5.11.2021: This man comes in today for follow-up.  He was seen 6 months ago.  At that time I ordered bone and CT scans.  He refused to have either 1 of these done.  He complains mainly about urinary frequency, urgency and nocturia at the present time.  He is adamant about restarting Lupron, as he feels like this will improve his urinary symptomatology.  He denies any bone pain.  He has a stable weight.  He still refuses to have any imaging studies done.  Originally diagnosed by Dr. Serita Butcher on 3.7.2002. At that time, his PSA was 22.7. Prostate cancer was found bilaterally-less than 5% of the biopsy tissue on the right revealed GS 3+3 pattern. 15% of the biopsy material on the left revealed GS 3+4 = 7 pattern. Metastatic survey was negative. The patient did not desire definitive therapy, i.e., radiation or surgery. He was put on androgen deprivation. He continued that until his visit with Dr. Junious Silk, in October 2015 at which time his PSA was up to 11.29. Bone scan in October 2015 revealed no evidence of osseous metastatic disease.  bone scan and CT scans from late May 2017 revealed no evidence of metastatic disease.  He has had a steady progression of his PSA since he was started on his androgen deprivation therapy vacation.  CT abdomen and pelvis as well as bone scans performed in June, 2018 revealed no evidence of metastatic disease.   11.12.2019: Most recent PSA (11.5.2019) was 18.   07/15/2017--no recent complaints of bone pain, weight gain or weight loss. Biggest complaint is fatigue. No blood in his urine or stool.   10/14/2017: Most recent PSA 15.3. T level 110. No c/o blood in urine or stool. Still on ADT vacation.   5.28.2019: Bone/CT scan both showed no evidence of metastatic disease.   11.12.2019: Most recent PSA 18.   10.13.2020: PSA 22.1, T level 94.   Past Medical History:  Diagnosis Date  .  Anxiety   . Cancer [facility])    prostate  . Chronic chest wall pain   . Essential hypertension, benign 06/20/2016  . GERD (gastroesophageal reflux disease)   . H/O echocardiogram 2016   normal  . Heart murmur    "leaking heart valve"  . High cholesterol 06/20/2016  . HOH (hard of hearing)    left  . Hypertension   . Poor historian   . PUD (peptic ulcer disease)   . Shortness of breath     Past Surgical History:  Procedure Laterality Date  . ESOPHAGOGASTRODUODENOSCOPY    . ESOPHAGOGASTRODUODENOSCOPY N/A 07/18/2016   Procedure: ESOPHAGOGASTRODUODENOSCOPY (EGD);  Surgeon: Rogene Houston, MD;  Location: [facility] ENDO SUITE;  Service: Endoscopy;  Laterality: N/A;  3:00-moved to 230 per Lelon Frohlich  . ORIF WRIST FRACTURE  12/19/2011   Procedure: OPEN REDUCTION INTERNAL FIXATION (ORIF) WRIST FRACTURE;  Surgeon: Carole Civil, MD;  Location: [facility] ORS;  Service: Orthopedics;  Laterality: Right;  . prostate cancer     Diagnosed in the 90s  . TONSILLECTOMY      Home Medications:  Allergies as of 10/19/2019   No Known Allergies     Medication List       Accurate as of Oct 19, 2019 10:15 AM. If you have any questions, ask your nurse or doctor.        alfuzosin 10 MG 24 hr tablet Commonly known as: UROXATRAL Take 10 mg  by mouth daily with breakfast.   aspirin EC 81 MG tablet Take 81 mg by mouth every morning.   calcium carbonate 500 MG chewable tablet Commonly known as: TUMS - dosed in mg elemental calcium Chew 1 tablet by mouth 2 (two) times daily as needed for indigestion or heartburn.   cholecalciferol 1000 units tablet Commonly known as: VITAMIN D Take 1,000 Units by mouth daily.   diltiazem 240 MG 24 hr capsule Commonly known as: CARDIZEM CD Take 240 mg by mouth daily.   diphenhydramine-acetaminophen 25-500 MG Tabs tablet Commonly known as: TYLENOL PM Take 1 tablet by mouth at bedtime.   Fish Oil-Flax Oil-Borage Oil Caps Take 1 capsule by mouth daily.   hydrochlorothiazide 25  MG tablet Commonly known as: HYDRODIURIL Take 25 mg by mouth daily.   lidocaine 5 % ointment Commonly known as: XYLOCAINE   lisinopril 40 MG tablet Commonly known as: ZESTRIL Take 40 mg by mouth 2 (two) times daily.   LORazepam 1 MG tablet Commonly known as: ATIVAN Take 1 mg by mouth 2 (two) times daily as needed for anxiety.   meclizine 25 MG tablet Commonly known as: ANTIVERT Take 25 mg by mouth as needed.   Melatonin 10 MG Tabs Take 10 mg by mouth at bedtime.   omeprazole 20 MG capsule Commonly known as: PRILOSEC Take 1 capsule (20 mg total) by mouth daily before breakfast. What changed: how much to take   potassium chloride SA 20 MEQ tablet Commonly known as: KLOR-CON Take 20 mEq by mouth 2 (two) times daily.   simvastatin 10 MG tablet Commonly known as: ZOCOR Take 1 tablet (10 mg total) by mouth at bedtime.   terazosin 5 MG capsule Commonly known as: HYTRIN Take 5 mg by mouth 2 (two) times daily.   triamcinolone cream 0.1 % Commonly known as: KENALOG APPLY TO AFFECTED AREASWTHREE TIMES DAILY AS NEEDED FOR RASH.   venlafaxine XR 75 MG 24 hr capsule Commonly known as: EFFEXOR-XR Take 75 mg by mouth daily.   vitamin C 500 MG tablet Commonly known as: ASCORBIC ACID Take 1,000 mg by mouth daily.       Allergies: No Known Allergies  Family History  Problem Relation Age of Onset  . Cancer Other   . Heart attack Mother   . Cancer Brother   . Heart attack Brother   . Heart disease Sister        by pass    Social History:  reports that he has never smoked. He has never used smokeless tobacco. He reports that he does not drink alcohol or use drugs.  ROS: Urological Symptom Review  Patient is experiencing the following symptoms: Frequent urination Hard to postpone urination Get up at night to urinate Leakage of urine Stream starts and stops Trouble starting stream Weak stream  Review of Systems  Gastrointestinal (upper)  : Negative for  upper GI symptoms Estill Bamberg to check to see if he needs a preop for Gastrointestinal (lower) : Negative for lower GI symptoms Constitutional : Negative for symptoms Skin: Negative for skin symptoms Eyes: Negative for eye symptoms Ear/Nose/Throat : Negative for Ear/Nose/Throat symptoms Hematologic/Lymphatic: Easy bruising Cardiovascular : Leg swelling Chest pain Respiratory : Shortness of breath Endocrine: Negative for endocrine symptoms Musculoskeletal: Back pain Neurological: Negative for neurological symptoms Psychologic: Anxiety                  Physical Exam:  Vital signs in last 24 hours: BP (!) 164/81   Pulse 64  Temp (!) 97.3 F (36.3 C)   Ht 5\' 9"  (1.753 m)   Wt 195 lb (88.5 kg)   BMI 28.80 kg/m  Constitutional:  Alert and oriented, No acute distress Cardiovascular: Regular rate  Respiratory: Normal respiratory effort Genitourinary: Prostate is 30 g, symmetrical, minimally firm. Lymphatic: No lymphadenopathy Neurologic: Grossly intact, no focal deficits Psychiatric: Normal mood and affect  Laboratory Data:  No results for input(s): WBC, HGB, HCT, PLT in the last 72 hours.  No results for input(s): NA, K, CL, GLUCOSE, BUN, CALCIUM, CREATININE in the last 72 hours.  Invalid input(s): CO3   Results for orders placed or performed in visit on 10/19/19 (from the past 24 hour(s))  POCT urinalysis dipstick     Status: Abnormal   Collection Time: 10/19/19  9:58 AM  Result Value Ref Range   Color, UA dk yellow    Clarity, UA clear    Glucose, UA Negative Negative   Bilirubin, UA moderate    Ketones, UA neg    Spec Grav, UA 1.015 1.010 - 1.025   Blood, UA neg    pH, UA 5.0 5.0 - 8.0   Protein, UA Positive (A) Negative   Urobilinogen, UA negative (A) 0.2 or 1.0 E.U./dL   Nitrite, UA neg    Leukocytes, UA Negative Negative   Appearance     Odor     I have reviewed prior pt notes  I have reviewed notes from referring/previous physicians  I  have reviewed urinalysis results  I have reviewed prior PSA/testosterone results  Impression/Assessment:  1.  Prostate cancer, diagnosed in 2002, without definitive curative therapy.  His last Lupron was many years ago.  He has had slow upward increase of his PSA but he is denied/refused imaging recently.  PSA 6 months ago was 22.  Currently, I am not concerned about bony metastatic disease but will never know this unless he has imaging.  He wants to start back on androgen deprivation therapy.  2.  Lower urinary tract symptoms, probably unrelated to his prostate cancer.  Plan:  I spent a significant amount of time with the patient discussing with him the fact that increasing PSA does not correlate with urinary tract symptomatology.  It does correlate with advancing prostate cancer, however.  He refuses imaging at this point.  He also wants to get back on ADT which we reinitiated today  I will have him come back in 4 months for repeat Lupron.  We will perform laboratories today and before I see him in 4 months.  He is also aware that without imaging we will not know about metastatic disease.  He does not want to be treated for metastatic disease anyway

## 2019-10-20 LAB — TESTOSTERONE: Testosterone: 78 ng/dL — ABNORMAL LOW (ref 250–827)

## 2019-10-20 LAB — PSA: PSA: 24 ng/mL — ABNORMAL HIGH (ref <=4.0)

## 2019-10-21 ENCOUNTER — Telehealth: Payer: Self-pay

## 2019-10-21 NOTE — Telephone Encounter (Signed)
-----   Message from Franchot Gallo, MD sent at 10/20/2019  5:03 PM EDT ----- Notify patient that PSA was lower at 24.  His testosterone is still pretty low at 70. ----- Message ----- From: Dorisann Frames, RN Sent: 10/20/2019   8:59 AM EDT To: Franchot Gallo, MD  Please review

## 2019-10-21 NOTE — Telephone Encounter (Signed)
Pt notified of results

## 2019-12-02 ENCOUNTER — Encounter (HOSPITAL_COMMUNITY): Payer: Self-pay | Admitting: *Deleted

## 2019-12-02 ENCOUNTER — Emergency Department (HOSPITAL_COMMUNITY): Payer: Medicare Other

## 2019-12-02 ENCOUNTER — Emergency Department (HOSPITAL_COMMUNITY)
Admission: EM | Admit: 2019-12-02 | Discharge: 2019-12-02 | Disposition: A | Discharge status: Home / Self Care | Payer: Medicare Other | Attending: Emergency Medicine | Admitting: Emergency Medicine

## 2019-12-02 ENCOUNTER — Other Ambulatory Visit: Payer: Self-pay

## 2019-12-02 DIAGNOSIS — Z8546 Personal history of malignant neoplasm of prostate: Secondary | ICD-10-CM | POA: Diagnosis not present

## 2019-12-02 DIAGNOSIS — Z7982 Long term (current) use of aspirin: Secondary | ICD-10-CM | POA: Diagnosis not present

## 2019-12-02 DIAGNOSIS — Z23 Encounter for immunization: Secondary | ICD-10-CM | POA: Diagnosis not present

## 2019-12-02 DIAGNOSIS — Y999 Unspecified external cause status: Secondary | ICD-10-CM | POA: Insufficient documentation

## 2019-12-02 DIAGNOSIS — S51812A Laceration without foreign body of left forearm, initial encounter: Secondary | ICD-10-CM | POA: Insufficient documentation

## 2019-12-02 DIAGNOSIS — Y929 Unspecified place or not applicable: Secondary | ICD-10-CM | POA: Diagnosis not present

## 2019-12-02 DIAGNOSIS — I10 Essential (primary) hypertension: Secondary | ICD-10-CM | POA: Insufficient documentation

## 2019-12-02 DIAGNOSIS — Y9389 Activity, other specified: Secondary | ICD-10-CM | POA: Diagnosis not present

## 2019-12-02 DIAGNOSIS — W25XXXA Contact with sharp glass, initial encounter: Secondary | ICD-10-CM | POA: Diagnosis not present

## 2019-12-02 DIAGNOSIS — S5012XA Contusion of left forearm, initial encounter: Secondary | ICD-10-CM

## 2019-12-02 MED ORDER — POVIDONE-IODINE 10 % OINT PACKET: TOPICAL_OINTMENT | Freq: Once | CUTANEOUS | Status: DC

## 2019-12-02 MED ORDER — TETANUS-DIPHTH-ACELL PERTUSSIS 5-2.5-18.5 LF-MCG/0.5 IM SUSP
0.5000 mL | Freq: Once | INTRAMUSCULAR | Status: AC
  Administered 2019-12-02: 0.5 mL via INTRAMUSCULAR
  Filled 2019-12-02: qty 0.5

## 2019-12-02 MED ORDER — POVIDONE-IODINE 10 % EX SOLN
CUTANEOUS | Status: AC
  Filled 2019-12-02: qty 30

## 2019-12-02 MED ORDER — DOUBLE ANTIBIOTIC 500-10000 UNIT/GM EX OINT
TOPICAL_OINTMENT | Freq: Once | CUTANEOUS | Status: AC
  Filled 2019-12-02: qty 3

## 2019-12-02 MED ORDER — BACITRACIN ZINC 500 UNIT/GM EX OINT: 1.0000 "application " | TOPICAL_OINTMENT | Freq: Two times a day (BID) | CUTANEOUS | Status: DC

## 2019-12-02 NOTE — Discharge Instructions (Signed)
Apply cold compresses to this frequently Change your dressings twice a day with a topical antibiotic ointment and a sterile dressing with a compression wrap similar to how we wrapped it in the emergency department See your doctor in 3 days for recheck and wound check Come back to the emergency department for increasing pain swelling numbness or weakness of the arm any changes in color of the hand or a cold hand Tylenol or ibuprofen for pain I would expect this to be swollen and bruised for up to 1 month or even longer in some cases.  Try to keep your arm elevated above your heart to prevent swelling down into the hand

## 2019-12-02 NOTE — ED Provider Notes (Signed)
[facility] Provider Note   CSN: [id number] Arrival date & time: 12/02/19  1730     History Chief Complaint  Patient presents with   Laceration    Jeffrey Sherman is a 86 y.o. male.  HPI   This patient is an 86 year old male, had an accidental fall where he slipped and tripped over his feet landing on his left forearm on the grass.  This occurred 4 hours ago.  He was held back up to his feet by his family, he complains of pain and swelling with some laceration to the left extensor surface of the proximal forearm.  He denies any numbness or weakness of the hand, no pain at the wrist, no pain in the shoulder, no injury to the head or the neck or the trunk.  No pain with deep breathing.  This is persistent, mild to moderate, treated with a dressing prehospital.  He is not on any anticoagulants.  Past Medical History:  Diagnosis Date   Anxiety    Cancer [facility])    prostate   Chronic chest wall pain    Essential hypertension, benign 06/20/2016   GERD (gastroesophageal reflux disease)    H/O echocardiogram 2016   normal   Heart murmur    "leaking heart valve"   High cholesterol 06/20/2016   HOH (hard of hearing)    left   Hypertension    Poor historian    PUD (peptic ulcer disease)    Shortness of breath     Patient Active Problem List   Diagnosis Date Noted   Infectious colitis    Anemia    Leukocytosis    Rectal bleeding 11/06/2017   Abdominal pain 11/06/2017   Hypokalemia 11/06/2017   Renal insufficiency 11/06/2017   Hyperglycemia 11/06/2017   Essential hypertension, benign 06/20/2016   High cholesterol 06/20/2016   Gastroesophageal reflux disease without esophagitis 06/20/2016   Dyspnea 12/17/2013   Constipation 12/16/2011   Wrist fracture 12/16/2011    Past Surgical History:  Procedure Laterality Date   ESOPHAGOGASTRODUODENOSCOPY     ESOPHAGOGASTRODUODENOSCOPY N/A 07/18/2016   Procedure:  ESOPHAGOGASTRODUODENOSCOPY (EGD);  Surgeon: Rogene Houston, MD;  Location[facility];  Service: Endoscopy;  Laterality: N/A;  3:00-moved to 230 per Ann   ORIF WRIST FRACTURE  12/19/2011   Procedure: OPEN REDUCTION INTERNAL FIXATION (ORIF) WRIST FRACTURE;  Surgeon: Carole Civil, MD;  Location: [facility] ORS;  Service: Orthopedics;  Laterality: Right;   prostate cancer     Diagnosed in the 90s   TONSILLECTOMY         Family History  Problem Relation Age of Onset   Cancer Other    Heart attack Mother    Cancer Brother    Heart attack Brother    Heart disease Sister        by pass    Social History   Tobacco Use   Smoking status: Never Smoker   Smokeless tobacco: Never Used  Substance Use Topics   Alcohol use: No   Drug use: No    Home Medications Prior to Admission medications   Medication Sig Start Date End Date Taking? Authorizing Provider  alfuzosin (UROXATRAL) 10 MG 24 hr tablet Take 10 mg by mouth daily with breakfast.    [provider]  aspirin EC 81 MG tablet Take 81 mg by mouth every morning.    [provider]  calcium carbonate (TUMS - DOSED IN MG ELEMENTAL CALCIUM) 500 MG chewable tablet Chew 1 tablet  by mouth 2 (two) times daily as needed for indigestion or heartburn.     [provider]  cholecalciferol (VITAMIN D) 1000 units tablet Take 1,000 Units by mouth daily.    [provider]  diltiazem (CARDIZEM CD) 240 MG 24 hr capsule Take 240 mg by mouth daily.  11/28/17   [provider]  diphenhydramine-acetaminophen (TYLENOL PM) 25-500 MG TABS tablet Take 1 tablet by mouth at bedtime.     [provider]  Flax Oil-Fish Oil-Borage Oil (FISH OIL-FLAX OIL-BORAGE OIL) CAPS Take 1 capsule by mouth daily.    [provider]  hydrochlorothiazide (HYDRODIURIL) 25 MG tablet Take 25 mg by mouth daily.    [provider]  lidocaine (XYLOCAINE) 5 % ointment  09/28/19   [provider]    lisinopril (PRINIVIL,ZESTRIL) 40 MG tablet Take 40 mg by mouth 2 (two) times daily.    [provider]  LORazepam (ATIVAN) 1 MG tablet Take 1 mg by mouth 2 (two) times daily as needed for anxiety.     [provider]  meclizine (ANTIVERT) 25 MG tablet Take 25 mg by mouth as needed.  11/14/17   [provider]  Melatonin 10 MG TABS Take 10 mg by mouth at bedtime.    [provider]  omeprazole (PRILOSEC) 20 MG capsule Take 1 capsule (20 mg total) by mouth daily before breakfast. Patient taking differently: Take 40 mg by mouth daily before breakfast.  12/23/17   Rehman, Mechele Dawley, MD  potassium chloride SA (K-DUR,KLOR-CON) 20 MEQ tablet Take 20 mEq by mouth 2 (two) times daily.    [provider]  simvastatin (ZOCOR) 10 MG tablet Take 1 tablet (10 mg total) by mouth at bedtime. 11/23/17   Barton Dubois, MD  terazosin (HYTRIN) 5 MG capsule Take 5 mg by mouth 2 (two) times daily. 09/14/17   [provider]  triamcinolone cream (KENALOG) 0.1 % APPLY TO AFFECTED AREASWTHREE TIMES DAILY AS NEEDED FOR RASH. 09/15/19   [provider]  venlafaxine XR (EFFEXOR-XR) 75 MG 24 hr capsule Take 75 mg by mouth daily.    [provider]  vitamin C (ASCORBIC ACID) 500 MG tablet Take 1,000 mg by mouth daily.    [provider]    Allergies    Patient has no known allergies.  Review of Systems   Review of Systems  All other systems reviewed and are negative.   Physical Exam Updated Vital Signs BP (!) 150/109    Pulse 71    Temp 98.6 F (37 C)    Resp 20    Ht 1.753 m (5\' 9" )    Wt 89.8 kg    SpO2 95%    BMI 29.24 kg/m   Physical Exam Vitals and nursing note reviewed.  Constitutional:      General: He is not in acute distress.    Appearance: He is well-developed.  HENT:     Head: Normocephalic and atraumatic.     Mouth/Throat:     Pharynx: No oropharyngeal exudate.  Eyes:     General: No scleral icterus.       Right eye: No  discharge.        Left eye: No discharge.     Conjunctiva/sclera: Conjunctivae normal.     Pupils: Pupils are equal, round, and reactive to light.  Neck:     Thyroid: No thyromegaly.     Vascular: No JVD.  Cardiovascular:     Rate and Rhythm: Normal  rate and regular rhythm.     Heart sounds: Normal heart sounds. No murmur heard.  No friction rub. No gallop.   Pulmonary:     Effort: Pulmonary effort is normal. No respiratory distress.     Breath sounds: Normal breath sounds. No wheezing or rales.  Abdominal:     General: Bowel sounds are normal. There is no distension.     Palpations: Abdomen is soft. There is no mass.     Tenderness: There is no abdominal tenderness.  Musculoskeletal:        General: Swelling, tenderness and signs of injury present. No deformity. Normal range of motion.     Cervical back: Normal range of motion and neck supple.     Right lower leg: No edema.     Left lower leg: No edema.     Comments: Totally normal range of motion of the shoulder elbow and wrist.  He has no pain with range of motion of those joints however he does have significant swelling of the left forearm.  It is still soft but noticeably swollen.  He has normal sensation distally to the hand, normal pulses at the wrist, normal color and temperature of the skin.  There are some superficial skin tears overlying the swollen area.  Lymphadenopathy:     Cervical: No cervical adenopathy.  Skin:    General: Skin is warm and dry.     Findings: No erythema or rash.     Comments: See above  Neurological:     Mental Status: He is alert.     Coordination: Coordination normal.     Comments: See above  Psychiatric:        Behavior: Behavior normal.     ED Results / Procedures / Treatments   Labs (all labs ordered are listed, but only abnormal results are displayed) Labs Reviewed - No data to display  EKG None  Radiology DG Forearm Left  Result Date: 12/02/2019 CLINICAL DATA:  Status post  trauma. EXAM: LEFT FOREARM - 2 VIEW COMPARISON:  None. FINDINGS: There is no evidence of acute fracture or other focal bone lesions. A small, chronic appearing deformity is seen along the distal left radius. This is present on the prior exam. Moderate soft tissue swelling is seen along the dorsal aspect of the proximal to mid left forearm. IMPRESSION: Moderate soft tissue swelling along the dorsal aspect of the proximal to mid left forearm. Electronically Signed   By: Virgina Norfolk M.D.   On: 12/02/2019 19:08    Procedures Procedures (including critical care time)  Medications Ordered in ED Medications  povidone-iodine (BETADINE) 10 % ointment (has no administration in time range)  povidone-iodine (BETADINE) 10 % external solution (has no administration in time range)  polymixin-bacitracin (POLYSPORIN) ointment (has no administration in time range)  bacitracin ointment 1 application (has no administration in time range)  Tdap (BOOSTRIX) injection 0.5 mL (has no administration in time range)    ED Course  I have reviewed the triage vital signs and the nursing notes.  Pertinent labs & imaging results that were available during my care of the patient were reviewed by me and considered in my medical decision making (see chart for details).    MDM Rules/Calculators/A&P                          I have personally viewed the x-rays, there is no signs of fractures or dislocation but just significant soft tissue  swelling.  The patient's exam shows significant swelling of the forearm however there is no signs of a compartment syndrome at this time.  There is no fractures just soft tissue swelling which I suspect will lead to extensive bruising.  I have encouraged the patient to do cold compresses, anti-inflammatories, wound care with antibiotics cream as well as sterile dressings which will be started in the emergency department after the wounds are cleansed.  Patient is agreeable to the  plan.  tdap updated  Final Clinical Impression(s) / ED Diagnoses Final diagnoses:  Contusion of left forearm, initial encounter  Skin tear of left forearm without complication, initial encounter    Rx / DC Orders ED Discharge Orders    None       Noemi Chapel, MD 12/02/19 2033

## 2019-12-02 NOTE — ED Notes (Signed)
Tripped   [place] denies hitting head   With lac/tear to R FA

## 2019-12-02 NOTE — ED Triage Notes (Signed)
Fell laceration to left arm with swelling

## 2019-12-28 ENCOUNTER — Other Ambulatory Visit: Payer: Self-pay

## 2019-12-28 ENCOUNTER — Emergency Department (HOSPITAL_COMMUNITY)
Admission: EM | Admit: 2019-12-28 | Discharge: 2019-12-28 | Disposition: A | Discharge status: Home / Self Care | Payer: Medicare Other | Attending: Emergency Medicine | Admitting: Emergency Medicine

## 2019-12-28 ENCOUNTER — Encounter (HOSPITAL_COMMUNITY): Payer: Self-pay | Admitting: Emergency Medicine

## 2019-12-28 DIAGNOSIS — Z7982 Long term (current) use of aspirin: Secondary | ICD-10-CM | POA: Insufficient documentation

## 2019-12-28 DIAGNOSIS — Z8546 Personal history of malignant neoplasm of prostate: Secondary | ICD-10-CM | POA: Insufficient documentation

## 2019-12-28 DIAGNOSIS — Y9289 Other specified places as the place of occurrence of the external cause: Secondary | ICD-10-CM | POA: Diagnosis not present

## 2019-12-28 DIAGNOSIS — Y9389 Activity, other specified: Secondary | ICD-10-CM | POA: Insufficient documentation

## 2019-12-28 DIAGNOSIS — Z79899 Other long term (current) drug therapy: Secondary | ICD-10-CM | POA: Diagnosis not present

## 2019-12-28 DIAGNOSIS — Y998 Other external cause status: Secondary | ICD-10-CM | POA: Insufficient documentation

## 2019-12-28 DIAGNOSIS — S51812S Laceration without foreign body of left forearm, sequela: Secondary | ICD-10-CM | POA: Diagnosis not present

## 2019-12-28 DIAGNOSIS — T148XXA Other injury of unspecified body region, initial encounter: Secondary | ICD-10-CM | POA: Diagnosis not present

## 2019-12-28 DIAGNOSIS — I10 Essential (primary) hypertension: Secondary | ICD-10-CM | POA: Insufficient documentation

## 2019-12-28 DIAGNOSIS — W19XXXA Unspecified fall, initial encounter: Secondary | ICD-10-CM | POA: Insufficient documentation

## 2019-12-28 DIAGNOSIS — S5012XA Contusion of left forearm, initial encounter: Secondary | ICD-10-CM | POA: Diagnosis not present

## 2019-12-28 MED ORDER — HYDROCODONE-ACETAMINOPHEN 5-325 MG PO TABS: 1.0000 | ORAL_TABLET | Freq: Four times a day (QID) | ORAL | 0 refills | Status: DC | PRN

## 2019-12-28 MED ORDER — LIDOCAINE-EPINEPHRINE 1 %-1:100000 IJ SOLN
30.0000 mL | Freq: Once | INTRAMUSCULAR | Status: AC
  Administered 2019-12-28: 30 mL via INTRADERMAL
  Filled 2019-12-28: qty 1

## 2019-12-28 MED ORDER — HYDROCODONE-ACETAMINOPHEN 5-325 MG PO TABS
1.0000 | ORAL_TABLET | Freq: Once | ORAL | Status: AC
  Administered 2019-12-28: 1 via ORAL
  Filled 2019-12-28: qty 1

## 2019-12-28 MED ORDER — DOXYCYCLINE HYCLATE 100 MG PO TABS
100.0000 mg | ORAL_TABLET | Freq: Once | ORAL | Status: AC
  Administered 2019-12-28: 100 mg via ORAL
  Filled 2019-12-28: qty 1

## 2019-12-28 MED ORDER — DOXYCYCLINE HYCLATE 100 MG PO CAPS: 100.0000 mg | ORAL_CAPSULE | Freq: Two times a day (BID) | ORAL | 0 refills | Status: AC

## 2019-12-28 NOTE — ED Triage Notes (Signed)
Pt arrives via POV from Dr. Vanetta Shawl office where patient was seen for followup evaluation for left arm hematoma from fall last month. Per family patient possibly requires surgery to remove hematoma. Page Dr. Amedeo Plenty once patient is in a room. PT is currently NPO.

## 2019-12-28 NOTE — Consult Note (Signed)
Reason for Consult:Left forearm hematoma Referring Physician: Coralyn Sherman  Jeffrey Sherman is an 86 y.o. male.  HPI: Jeffrey Sherman fell and struck his left arm against a trashcan on 6/24 and developed a forearm hematoma. It has persisted until now and he has developed a wound overlying it. He was directed to come to ED for evaluation and likely evacuation. He is not on any blood thinners. He is RHD and usually ambulates with a cane.  Past Medical History:  Diagnosis Date  . Anxiety   . Cancer [facility])    prostate  . Chronic chest wall pain   . Essential hypertension, benign 06/20/2016  . GERD (gastroesophageal reflux disease)   . H/O echocardiogram 2016   normal  . Heart murmur    "leaking heart valve"  . High cholesterol 06/20/2016  . HOH (hard of hearing)    left  . Hypertension   . Poor historian   . PUD (peptic ulcer disease)   . Shortness of breath     Past Surgical History:  Procedure Laterality Date  . ESOPHAGOGASTRODUODENOSCOPY    . ESOPHAGOGASTRODUODENOSCOPY N/A 07/18/2016   Procedure: ESOPHAGOGASTRODUODENOSCOPY (EGD);  Surgeon: Jeffrey Houston, MD;  Location[facility];  Service: Endoscopy;  Laterality: N/A;  3:00-moved to 230 per Jeffrey Sherman  . ORIF WRIST FRACTURE  12/19/2011   Procedure: OPEN REDUCTION INTERNAL FIXATION (ORIF) WRIST FRACTURE;  Surgeon: Jeffrey Civil, MD;  Location: [facility] ORS;  Service: Orthopedics;  Laterality: Right;  . prostate cancer     Diagnosed in the 90s  . TONSILLECTOMY      Family History  Problem Relation Age of Onset  . Cancer Other   . Heart attack Mother   . Cancer Brother   . Heart attack Brother   . Heart disease Sister        by pass    Social History:  reports that he has never smoked. He has never used smokeless tobacco. He reports that he does not drink alcohol and does not use drugs.  Allergies: No Known Allergies  Medications: I have reviewed the patient's current medications.  No results found for this or any previous visit (from the  past 48 hour(s)).  No results found.  Review of Systems  HENT: Negative for ear discharge, ear pain, hearing loss and tinnitus.   Eyes: Negative for photophobia and pain.  Respiratory: Negative for cough and shortness of breath.   Cardiovascular: Negative for chest pain.  Gastrointestinal: Negative for abdominal pain, nausea and vomiting.  Genitourinary: Negative for dysuria, flank pain, frequency and urgency.  Musculoskeletal: Positive for myalgias (Left forearm). Negative for back pain and neck pain.  Neurological: Negative for dizziness and headaches.  Hematological: Does not bruise/bleed easily.  Psychiatric/Behavioral: The patient is not nervous/anxious.    Blood pressure (!) 147/71, pulse 61, temperature 98.3 F (36.8 C), temperature source Oral, resp. rate 14, height 5\' 9"  (1.753 m), weight 90.7 kg, SpO2 95 %. Physical Exam Constitutional:      General: He is not in acute distress.    Appearance: He is well-developed. He is not diaphoretic.  HENT:     Head: Normocephalic and atraumatic.  Eyes:     General: No scleral icterus.       Right eye: No discharge.        Left eye: No discharge.     Conjunctiva/sclera: Conjunctivae normal.  Cardiovascular:     Rate and Rhythm: Normal rate and regular rhythm.  Pulmonary:  Effort: Pulmonary effort is normal. No respiratory distress.  Musculoskeletal:     Cervical back: Normal range of motion.     Comments: Left shoulder, elbow, wrist, digits- Dorsal FA hematoma with full thickness skin loss overlying, mild-mod TTP, no instability, no blocks to motion  Sens  Ax/R/M/U intact  Mot   Ax/ R/ PIN/ M/ AIN/ U intact  Rad 2+  Skin:    General: Skin is warm and dry.  Neurological:     Mental Status: He is alert.  Psychiatric:        Behavior: Behavior normal.     Assessment/Plan: Left forearm hematoma -- Plan bedside I&D by Dr. Amedeo Sherman today. Anticipate discharge after procedure. May need formal wound care and/or plastic surgery  for wound closure.  Multiple medical problems including HTN, HLD, and GERD    Lisette Abu, PA-C Orthopedic Surgery [phone] 12/28/2019, 4:28 PM

## 2019-12-28 NOTE — Discharge Instructions (Addendum)
At this time there does not appear to be the presence of an emergent medical condition, however there is always the potential for conditions to change. Please read and follow the below instructions.  Please return to the Emergency Department immediately for any new or worsening symptoms. Go to Dr. Vanetta Shawl office tomorrow at noon for reevaluation. Please take your antibiotic Doxycycline as prescribed until complete to help with your symptoms.  Please drink enough water to avoid dehydration and get plenty of rest. You may take the medication Norco (Hydrocodone/Acetaminophen) as prescribed to help with severe pain.  This medication will make you drowsy so do not drive, drink alcohol, take other sedating medications or perform any dangerous activities such as driving after taking Norco. Norco contains Tylenol (acetaminophen) so do not take any other Tylenol-containing products with Norco.  Do not take Norco with lorazepam as this will make side effects worse.  Get help right away if: You have fever or chills Your pain is not getting better with medicine. Your skin over the hematoma breaks or starts to bleed. You have chest pain or trouble breathing You have any new/concerning or worsening of symptoms  Please read the additional information packets attached to your discharge summary.  Do not take your medicine if  develop an itchy rash, swelling in your mouth or lips, or difficulty breathing; call 911 and seek immediate emergency medical attention if this occurs.  You may review your lab tests and imaging results in their entirety on your MyChart account.  Please discuss all results of fully with your primary care provider and other specialist at your follow-up visit.  Note: Portions of this text may have been transcribed using voice recognition software. Every effort was made to ensure accuracy; however, inadvertent computerized transcription errors may still be present.

## 2019-12-28 NOTE — ED Provider Notes (Signed)
[facility] EMERGENCY DEPARTMENT Provider Note   CSN: [id number] Arrival date & time: 12/28/19  1436     History Chief Complaint  Patient presents with  . Fall    Jeffrey Sherman is a 86 y.o. male history prostate cancer, hypertension, GERD, hypercholesterolemia, PUD.  Patient sent in today by PCP for evaluation of left forearm hematoma.  Patient suffered a fall last month, at that time he had x-rays which showed no fracture.  He reports that the swelling of his arm has continued and has recently opened up on the dorsal forearm exposing a blood clot.  Plan of care today is for hand surgery evaluation and evacuation of the hematoma.  Patient denies any new fall, denies fever/chills, chest pain/shortness of breath, abdominal pain, nausea/vomiting, numbness/weakness, tingling or any additional concerns.  HPI     Past Medical History:  Diagnosis Date  . Anxiety   . Cancer [facility])    prostate  . Chronic chest wall pain   . Essential hypertension, benign 06/20/2016  . GERD (gastroesophageal reflux disease)   . H/O echocardiogram 2016   normal  . Heart murmur    "leaking heart valve"  . High cholesterol 06/20/2016  . HOH (hard of hearing)    left  . Hypertension   . Poor historian   . PUD (peptic ulcer disease)   . Shortness of breath     Patient Active Problem List   Diagnosis Date Noted  . Infectious colitis   . Anemia   . Leukocytosis   . Rectal bleeding 11/06/2017  . Abdominal pain 11/06/2017  . Hypokalemia 11/06/2017  . Renal insufficiency 11/06/2017  . Hyperglycemia 11/06/2017  . Essential hypertension, benign 06/20/2016  . High cholesterol 06/20/2016  . Gastroesophageal reflux disease without esophagitis 06/20/2016  . Dyspnea 12/17/2013  . Constipation 12/16/2011  . Wrist fracture 12/16/2011    Past Surgical History:  Procedure Laterality Date  . ESOPHAGOGASTRODUODENOSCOPY    . ESOPHAGOGASTRODUODENOSCOPY N/A 07/18/2016   Procedure:  ESOPHAGOGASTRODUODENOSCOPY (EGD);  Surgeon: Rogene Houston, MD;  [facility];  Service: Endoscopy;  Laterality: N/A;  3:00-moved to 230 per Lelon Frohlich  . ORIF WRIST FRACTURE  12/19/2011   Procedure: OPEN REDUCTION INTERNAL FIXATION (ORIF) WRIST FRACTURE;  Surgeon: Carole Civil, MD;  Location: [facility] ORS;  Service: Orthopedics;  Laterality: Right;  . prostate cancer     Diagnosed in the 90s  . TONSILLECTOMY         Family History  Problem Relation Age of Onset  . Cancer Other   . Heart attack Mother   . Cancer Brother   . Heart attack Brother   . Heart disease Sister        by pass    Social History   Tobacco Use  . Smoking status: Never Smoker  . Smokeless tobacco: Never Used  Substance Use Topics  . Alcohol use: No  . Drug use: No    Home Medications Prior to Admission medications   Medication Sig Start Date End Date Taking? Authorizing Provider  alfuzosin (UROXATRAL) 10 MG 24 hr tablet Take 10 mg by mouth daily with breakfast.    [provider]  aspirin EC 81 MG tablet Take 81 mg by mouth every morning.    [provider]  calcium carbonate (TUMS - DOSED IN MG ELEMENTAL CALCIUM) 500 MG chewable tablet Chew 1 tablet by mouth 2 (two) times daily as needed for indigestion or heartburn.     [provider]  cholecalciferol (VITAMIN D) 1000 units tablet Take 1,000 Units by mouth daily.    [provider]  diltiazem (CARDIZEM CD) 240 MG 24 hr capsule Take 240 mg by mouth daily.  11/28/17   [provider]  diphenhydramine-acetaminophen (TYLENOL PM) 25-500 MG TABS tablet Take 1 tablet by mouth at bedtime.     [provider]  doxycycline (VIBRAMYCIN) 100 MG capsule Take 1 capsule (100 mg total) by mouth 2 (two) times daily for 14 days. 12/28/19 01/11/20  Nuala Alpha A, PA-C  Flax Oil-Fish Oil-Borage Oil (FISH OIL-FLAX OIL-BORAGE OIL) CAPS Take 1 capsule by mouth daily.    [provider]  hydrochlorothiazide  (HYDRODIURIL) 25 MG tablet Take 25 mg by mouth daily.    [provider]  HYDROcodone-acetaminophen (NORCO/VICODIN) 5-325 MG tablet Take 1 tablet by mouth every 6 (six) hours as needed. 12/28/19   Nuala Alpha A, PA-C  lidocaine (XYLOCAINE) 5 % ointment  09/28/19   [provider]  lisinopril (PRINIVIL,ZESTRIL) 40 MG tablet Take 40 mg by mouth 2 (two) times daily.    [provider]  LORazepam (ATIVAN) 1 MG tablet Take 1 mg by mouth 2 (two) times daily as needed for anxiety.     [provider]  meclizine (ANTIVERT) 25 MG tablet Take 25 mg by mouth as needed.  11/14/17   [provider]  Melatonin 10 MG TABS Take 10 mg by mouth at bedtime.    [provider]  omeprazole (PRILOSEC) 20 MG capsule Take 1 capsule (20 mg total) by mouth daily before breakfast. Patient taking differently: Take 40 mg by mouth daily before breakfast.  12/23/17   Rehman, Mechele Dawley, MD  potassium chloride SA (K-DUR,KLOR-CON) 20 MEQ tablet Take 20 mEq by mouth 2 (two) times daily.    [provider]  simvastatin (ZOCOR) 10 MG tablet Take 1 tablet (10 mg total) by mouth at bedtime. 11/23/17   Barton Dubois, MD  terazosin (HYTRIN) 5 MG capsule Take 5 mg by mouth 2 (two) times daily. 09/14/17   [provider]  triamcinolone cream (KENALOG) 0.1 % APPLY TO AFFECTED AREASWTHREE TIMES DAILY AS NEEDED FOR RASH. 09/15/19   [provider]  venlafaxine XR (EFFEXOR-XR) 75 MG 24 hr capsule Take 75 mg by mouth daily.    [provider]  vitamin C (ASCORBIC ACID) 500 MG tablet Take 1,000 mg by mouth daily.    [provider]    Allergies    Patient has no known allergies.  Review of Systems   Review of Systems Ten systems are reviewed and are negative for acute change except as noted in the HPI Physical Exam Updated Vital Signs BP (!) 147/71 (BP Location: Right Arm)   Pulse 61   Temp 98.3 F (36.8 C) (Oral)   Resp 14   Ht 5\' 9"  (1.753  m)   Wt 90.7 kg   SpO2 95%   BMI 29.53 kg/m   Physical Exam Constitutional:      General: He is not in acute distress.    Appearance: Normal appearance. He is well-developed. He is not ill-appearing or diaphoretic.  HENT:     Head: Normocephalic and atraumatic.  Eyes:     General: Vision grossly intact. Gaze aligned appropriately.     Pupils: Pupils are equal, round, and reactive to light.  Neck:     Trachea: Trachea and phonation normal.  Pulmonary:     Effort: Pulmonary effort is normal. No respiratory distress.  Abdominal:     General: There is no distension.     Palpations: Abdomen is soft.     Tenderness: There is no abdominal tenderness. There is no guarding or rebound.  Musculoskeletal:        General: Normal range of motion.     Cervical back: Normal range of motion.     Comments: Good range of motion and strength at the left elbow forearm and hand without pain.  Capillary refill and sensation intact to all fingers.  Strong and equal radial pulses.  Skin:    General: Skin is warm and dry.          Comments: Large hematoma approximately 8 cm x 5 cm to the dorsal left forearm.  There is approximately a 1-2 cm area where it has opened up exposing blood clot.  No active bleeding.  Neurological:     Mental Status: He is alert.     GCS: GCS eye subscore is 4. GCS verbal subscore is 5. GCS motor subscore is 6.     Comments: Speech is clear and goal oriented, follows commands Major Cranial nerves without deficit, no facial droop Moves extremities without ataxia, coordination intact  Psychiatric:        Behavior: Behavior normal.     ED Results / Procedures / Treatments   Labs (all labs ordered are listed, but only abnormal results are displayed) Labs Reviewed - No data to display  EKG None  Radiology No results found.  Procedures Procedures (including critical care time)  Medications Ordered in ED Medications  lidocaine-EPINEPHrine (XYLOCAINE W/EPI) 1  %-1:100000 (with pres) injection 30 mL (30 mLs Intradermal Given 12/28/19 1644)  doxycycline (VIBRA-TABS) tablet 100 mg (100 mg Oral Given 12/28/19 1844)  HYDROcodone-acetaminophen (NORCO/VICODIN) 5-325 MG per tablet 1 tablet (1 tablet Oral Given 12/28/19 1844)    ED Course  I have reviewed the triage vital signs and the nursing notes.  Pertinent labs & imaging results that were available during my care of the patient were reviewed by me and considered in my medical decision making (see chart for details).  Clinical Course as of Dec 27 1845  Tue Dec 28, 2019  1829 Dr. Amedeo Plenty; Doxycycline 100mg  BID x 2 weeks, follow-up in office tomorrow.   [BM]    Clinical Course User Index [BM] Gari Crown   MDM Rules/Calculators/A&P                         Additional History Obtained: 1. Nursing notes from this visit. 2. Electronic medical record, patient presented on 12/02/2019 after a fall.  He had contusion and skin tear of the left forearm.  X-ray at that time showed soft tissue swelling without acute fracture.  Tdap was updated and bacitracin/polymyxin was applied. ------------------------------------ 86 year old male arrives for hand surgery evaluation.  He has large hematoma to the left forearm, no active bleeding.  Neurovascular intact distally.  Strong equal radial pulses, good capillary refill and sensation, good range of motion at the wrist elbow and hand. - 4:25 PM: Discussed case in person with hand surgery PA Hilbert Odor, advises that Dr. Amedeo Plenty is in route to evaluate patient. --- Patient underwent irrigation and drainage by Dr. Amedeo Plenty.  Plan of care is discharged with doxycycline 100 mg twice daily x2 weeks as well as pain control.  Patient to see Dr. Amedeo Plenty in his office tomorrow at noon.  On reassessment patient resting comfortably no acute distress states understanding  of care plan has no questions.  First dose doxycycline given in the ER, patient given 1 Norco for pain  control.  His wife is here to drive home, patient states understanding of narcotic precautions.  Patient prescribed 6 (six) pills Norco for pain.  PDMP reviewed, patient without recent opiate prescriptions, he was recently prescribed lorazepam.  I advised patient to not take pain medication with lorazepam due to side effects and patient and his wife stated understanding.  At this time there does not appear to be any evidence of an acute emergency medical condition and the patient appears stable for discharge with appropriate outpatient follow up. Diagnosis was discussed with patient who verbalizes understanding of care plan and is agreeable to discharge. I have discussed return precautions with patient and wife who verbalizes understanding. Patient encouraged to follow-up with Dr. Amedeo Plenty. All questions answered.  Patient's case discussed with Dr. Laverta Baltimore who agrees with plan to discharge with follow-up.   Note: Portions of this report may have been transcribed using voice recognition software. Every effort was made to ensure accuracy; however, inadvertent computerized transcription errors may still be present. Final Clinical Impression(s) / ED Diagnoses Final diagnoses:  Hematoma    Rx / DC Orders ED Discharge Orders         Ordered    HYDROcodone-acetaminophen (NORCO/VICODIN) 5-325 MG tablet  Every 6 hours PRN     Discontinue  Reprint     12/28/19 1846    doxycycline (VIBRAMYCIN) 100 MG capsule  2 times daily     Discontinue  Reprint     12/28/19 1846           Deliah Boston, PA-C 12/28/19 1847    Margette Fast, MD 12/28/19 [phone]

## 2019-12-28 NOTE — Consult Note (Signed)
Reason for Consult-request to evaluate and treat left forearm hematoma Referring Physician: ER staff and outside primary care provider  VINSON TIETZE is an 86 y.o. male.  HPI: 86 year old male with hematoma about his arm.  Patient had a 46-week old hematoma unfortunately this is eroded his skin and he has a quarter sized area of de-epithelialized and open injury.  There is extruding hematoma which is about the size of a softball.  This is very impressive on his examination.  He is here today with his daughter-in-law.  They are very nice people.  They are from [place].  He came down today for evaluation and treatment services.  I reviewed his exam at length and the findings.  Past Medical History:  Diagnosis Date  . Anxiety   . Cancer [facility])    prostate  . Chronic chest wall pain   . Essential hypertension, benign 06/20/2016  . GERD (gastroesophageal reflux disease)   . H/O echocardiogram 2016   normal  . Heart murmur    "leaking heart valve"  . High cholesterol 06/20/2016  . HOH (hard of hearing)    left  . Hypertension   . Poor historian   . PUD (peptic ulcer disease)   . Shortness of breath     Past Surgical History:  Procedure Laterality Date  . ESOPHAGOGASTRODUODENOSCOPY    . ESOPHAGOGASTRODUODENOSCOPY N/A 07/18/2016   Procedure: ESOPHAGOGASTRODUODENOSCOPY (EGD);  Surgeon: Rogene Houston, MD;  Location[facility];  Service: Endoscopy;  Laterality: N/A;  3:00-moved to 230 per Lelon Frohlich  . ORIF WRIST FRACTURE  12/19/2011   Procedure: OPEN REDUCTION INTERNAL FIXATION (ORIF) WRIST FRACTURE;  Surgeon: Carole Civil, MD;  Location: [facility] ORS;  Service: Orthopedics;  Laterality: Right;  . prostate cancer     Diagnosed in the 90s  . TONSILLECTOMY      Family History  Problem Relation Age of Onset  . Cancer Other   . Heart attack Mother   . Cancer Brother   . Heart attack Brother   . Heart disease Sister        by pass    Social History:  reports that he has never  smoked. He has never used smokeless tobacco. He reports that he does not drink alcohol and does not use drugs.  Allergies: No Known Allergies  Medications: I have reviewed the patient's current medications.  No results found for this or any previous visit (from the past 48 hour(s)).  No results found.  Review of Systems  Respiratory: Negative.   Cardiovascular: Negative.   Endocrine: Negative.   Genitourinary: Negative.    Blood pressure (!) 177/91, pulse 87, temperature 98.3 F (36.8 C), resp. rate 15, height 5\' 9"  (1.753 m), weight 90.7 kg, SpO2 95 %. Physical Exam Large softball size hematoma forearm with extruding hematoma.  There is open quarter size skin injury.  He notes no other complaints.  I reviewed this with him at length fortunately he is neurovascularly intact.  No significant radial median or ulnar nerve issues and no compartment syndrome.  The patient is alert and oriented in no acute distress. The patient complains of pain in the affected upper extremity.  The patient is noted to have a normal HEENT exam. Lung fields show equal chest expansion and no shortness of breath. Abdomen exam is nontender without distention. Lower extremity examination does not show any fracture dislocation or blood clot symptoms. Pelvis is stable and the neck and back are stable and nontender. Assessment/Plan: Large left  forearm hematoma.  The patient was consented and and following this underwent evacuation of the hematoma this was a softball size hematoma which I evacuated without difficulty.  Following the hematoma evacuation the patient then underwent irrigation debridement of skin subtenons tissue and a limited extensor tenolysis.  Patient tolerated this well.  Following this I then packed the wound with gauze and perform dressing of Xeroform 4 x 4's and compressive wrap.  We will see him tomorrow in the office and begin wet-to-dry dressing changes and whirlpools.  I discussed  with the patient and his family all issues.  He will need a compressive wrap and daily dressing changes with whirlpool and wicking of the wound.  Discharge medicines doxycycline 100 twice daily x14 days and in addition to this pain management as provided by the ER tonight.  I discussed all issues.  He tolerated the procedure well.  Amedeo Plenty MD  Satira Anis Lani Havlik III 12/28/2019, 7:02 PM

## 2019-12-29 ENCOUNTER — Telehealth: Payer: Self-pay | Admitting: Orthopedic Surgery

## 2019-12-29 DIAGNOSIS — M79602 Pain in left arm: Secondary | ICD-10-CM | POA: Diagnosis not present

## 2019-12-29 NOTE — Telephone Encounter (Signed)
Pt's daughter in law called and stated that Jeffrey Sherman had recently fell and had a hematoma surgically removed.  She said that now he was having to have daily whirlpools.  She wanted to know if we knew of a PT facility around [place] that had a whirlpool.  I gave her the phone number to a couple of locations but I told her that I was unsure what they had in office and what they could offer.  She doesn't want to have to take him to [facility] everyday and that they were told that Medicare will not pay for him to have home health for this.  I told her again that I could only tell her the therapy facilities that I know of and that she should speak with the surgeon or his office staff for further help.

## 2019-12-30 DIAGNOSIS — M79602 Pain in left arm: Secondary | ICD-10-CM | POA: Diagnosis not present

## 2019-12-31 DIAGNOSIS — M79602 Pain in left arm: Secondary | ICD-10-CM | POA: Diagnosis not present

## 2020-01-03 DIAGNOSIS — M79642 Pain in left hand: Secondary | ICD-10-CM | POA: Diagnosis not present

## 2020-01-05 DIAGNOSIS — M79642 Pain in left hand: Secondary | ICD-10-CM | POA: Diagnosis not present

## 2020-02-07 ENCOUNTER — Other Ambulatory Visit: Payer: Self-pay

## 2020-02-07 ENCOUNTER — Other Ambulatory Visit: Payer: Medicare Other

## 2020-02-07 DIAGNOSIS — C61 Malignant neoplasm of prostate: Secondary | ICD-10-CM | POA: Diagnosis not present

## 2020-02-08 LAB — PSA: Prostate Specific Ag, Serum: 24.4 ng/mL — ABNORMAL HIGH (ref 0.0–4.0)

## 2020-02-08 LAB — TESTOSTERONE: Testosterone: 32 ng/dL — ABNORMAL LOW (ref 264–916)

## 2020-02-15 ENCOUNTER — Ambulatory Visit (INDEPENDENT_AMBULATORY_CARE_PROVIDER_SITE_OTHER): Payer: Medicare Other | Admitting: Urology

## 2020-02-15 ENCOUNTER — Encounter: Payer: Self-pay | Admitting: Urology

## 2020-02-15 ENCOUNTER — Other Ambulatory Visit: Payer: Self-pay

## 2020-02-15 VITALS — BP 145/80 | HR 103 | Temp 98.3°F | Ht 69.0 in | Wt 197.0 lb

## 2020-02-15 DIAGNOSIS — C61 Malignant neoplasm of prostate: Secondary | ICD-10-CM

## 2020-02-15 DIAGNOSIS — R350 Frequency of micturition: Secondary | ICD-10-CM | POA: Diagnosis not present

## 2020-02-15 DIAGNOSIS — M545 Low back pain, unspecified: Secondary | ICD-10-CM

## 2020-02-15 DIAGNOSIS — R3915 Urgency of urination: Secondary | ICD-10-CM | POA: Diagnosis not present

## 2020-02-15 LAB — MICROSCOPIC EXAMINATION
Bacteria, UA: NONE SEEN
Epithelial Cells (non renal): NONE SEEN /hpf (ref 0–10)
RBC, Urine: NONE SEEN /hpf (ref 0–2)
Renal Epithel, UA: NONE SEEN /hpf
WBC, UA: NONE SEEN /hpf (ref 0–5)

## 2020-02-15 LAB — URINALYSIS, ROUTINE W REFLEX MICROSCOPIC
Bilirubin, UA: NEGATIVE
Glucose, UA: NEGATIVE
Ketones, UA: NEGATIVE
Leukocytes,UA: NEGATIVE
Nitrite, UA: NEGATIVE
RBC, UA: NEGATIVE
Specific Gravity, UA: 1.02 (ref 1.005–1.030)
Urobilinogen, Ur: 0.2 mg/dL (ref 0.2–1.0)
pH, UA: 6 (ref 5.0–7.5)

## 2020-02-15 MED ORDER — OXYBUTYNIN CHLORIDE 5 MG PO TABS: 5.0000 mg | ORAL_TABLET | Freq: Three times a day (TID) | ORAL | 3 refills | Status: DC | PRN

## 2020-02-15 MED ORDER — OXYBUTYNIN CHLORIDE 5 MG PO TABS: 5.0000 mg | ORAL_TABLET | Freq: Three times a day (TID) | ORAL | 11 refills | Status: DC | PRN

## 2020-02-15 NOTE — Progress Notes (Signed)

## 2020-02-15 NOTE — Progress Notes (Signed)
H&P  Chief Complaint: PCa and BPH w/LUTS  History of Present Illness:   9.7.2021: Pt reports that he is experiencing urinary frequency despite taking both alfuzosin and terazosin. He does c/o about his urinary frequency.  Pt denies any recent UTIs, significant urinary incontinence, gross hematuria, or blood in his stool. He realizes that Lupron did not help his LUTS. Pt experiencing pain in his back and reports a sedentary lifestyle. PSA: 24  (below copied from [facility] records):  5.11.2021: This man comes in today for follow-up.  He was seen 6 months ago.  At that time I ordered bone and CT scans.  He refused to have either 1 of these done.  He complains mainly about urinary frequency, urgency and nocturia at the present time.  He is adamant about restarting Lupron, as he feels like this will improve his urinary symptomatology. This was restarted.  He denies any bone pain.  He has a stable weight.  He still refuses to have any imaging studies done.  Originally diagnosed by Dr. Serita Butcher on 3.7.2002. At that time, his PSA was 22.7. Prostate cancer was found bilaterally-less than 5% of the biopsy tissue on the right revealed GS 3+3 pattern. 15% of the biopsy material on the left revealed GS 3+4 = 7 pattern. Metastatic survey was negative. The patient did not desire definitive therapy, i.e., radiation or surgery. He was put on androgen deprivation. He continued that until his visit with Dr. Junious Silk, in October 2015 at which time his PSA was up to 11.29. Bone scan in October 2015 revealed no evidence of osseous metastatic disease.  bone scan and CT scans from late May 2017 revealed no evidence of metastatic disease.  He has had a steady progression of his PSA since he was started on his androgen deprivation therapy vacation.  CT abdomen and pelvis as well as bone scans performed in June, 2018 revealed no evidence of metastatic disease.   11.12.2019: Most recent PSA (11.5.2019) was 18.    07/15/2017--no recent complaints of bone pain, weight gain or weight loss. Biggest complaint is fatigue. No blood in his urine or stool.   10/14/2017: Most recent PSA 15.3. T level 110. No c/o blood in urine or stool. Still on ADT vacation.   5.28.2019: Bone/CT scan both showed no evidence of metastatic disease.   11.12.2019: Most recent PSA 18.   10.13.2020: PSA 22.1, T level 94.  Past Medical History:  Diagnosis Date  . Anxiety   . Cancer [facility])    prostate  . Chronic chest wall pain   . Essential hypertension, benign 06/20/2016  . GERD (gastroesophageal reflux disease)   . H/O echocardiogram 2016   normal  . Heart murmur    "leaking heart valve"  . High cholesterol 06/20/2016  . HOH (hard of hearing)    left  . Hypertension   . Poor historian   . PUD (peptic ulcer disease)   . Shortness of breath     Past Surgical History:  Procedure Laterality Date  . ESOPHAGOGASTRODUODENOSCOPY    . ESOPHAGOGASTRODUODENOSCOPY N/A 07/18/2016   Procedure: ESOPHAGOGASTRODUODENOSCOPY (EGD);  Surgeon: Rogene Houston, MD;  [facility];  Service: Endoscopy;  Laterality: N/A;  3:00-moved to 230 per Lelon Frohlich  . ORIF WRIST FRACTURE  12/19/2011   Procedure: OPEN REDUCTION INTERNAL FIXATION (ORIF) WRIST FRACTURE;  Surgeon: Carole Civil, MD;  Location: [facility] ORS;  Service: Orthopedics;  Laterality: Right;  . prostate cancer     Diagnosed in the 90s  .  TONSILLECTOMY      Home Medications:  Allergies as of 02/15/2020   No Known Allergies     Medication List       Accurate as of February 15, 2020  1:10 PM. If you have any questions, ask your nurse or doctor.        alfuzosin 10 MG 24 hr tablet Commonly known as: UROXATRAL Take 10 mg by mouth daily with breakfast.   aspirin EC 81 MG tablet Take 81 mg by mouth every morning.   calcium carbonate 500 MG chewable tablet Commonly known as: TUMS - dosed in mg elemental calcium Chew 1 tablet by mouth 2 (two) times daily as needed for  indigestion or heartburn.   cholecalciferol 1000 units tablet Commonly known as: VITAMIN D Take 1,000 Units by mouth daily.   diltiazem 240 MG 24 hr capsule Commonly known as: CARDIZEM CD Take 240 mg by mouth daily.   diphenhydramine-acetaminophen 25-500 MG Tabs tablet Commonly known as: TYLENOL PM Take 1 tablet by mouth at bedtime.   Fish Oil-Flax Oil-Borage Oil Caps Take 1 capsule by mouth daily.   hydrochlorothiazide 25 MG tablet Commonly known as: HYDRODIURIL Take 25 mg by mouth daily.   HYDROcodone-acetaminophen 5-325 MG tablet Commonly known as: NORCO/VICODIN Take 1 tablet by mouth every 6 (six) hours as needed.   lidocaine 5 % ointment Commonly known as: XYLOCAINE   lisinopril 40 MG tablet Commonly known as: ZESTRIL Take 40 mg by mouth 2 (two) times daily.   LORazepam 1 MG tablet Commonly known as: ATIVAN Take 1 mg by mouth 2 (two) times daily as needed for anxiety.   meclizine 25 MG tablet Commonly known as: ANTIVERT Take 25 mg by mouth as needed.   Melatonin 10 MG Tabs Take 10 mg by mouth at bedtime.   omeprazole 20 MG capsule Commonly known as: PRILOSEC Take 1 capsule (20 mg total) by mouth daily before breakfast. What changed: how much to take   potassium chloride SA 20 MEQ tablet Commonly known as: KLOR-CON Take 20 mEq by mouth 2 (two) times daily.   simvastatin 10 MG tablet Commonly known as: ZOCOR Take 1 tablet (10 mg total) by mouth at bedtime.   terazosin 5 MG capsule Commonly known as: HYTRIN Take 5 mg by mouth 2 (two) times daily.   triamcinolone cream 0.1 % Commonly known as: KENALOG APPLY TO AFFECTED AREASWTHREE TIMES DAILY AS NEEDED FOR RASH.   venlafaxine XR 75 MG 24 hr capsule Commonly known as: EFFEXOR-XR Take 75 mg by mouth daily.   vitamin C 500 MG tablet Commonly known as: ASCORBIC ACID Take 1,000 mg by mouth daily.       Allergies: No Known Allergies  Family History  Problem Relation Age of Onset  . Cancer  Other   . Heart attack Mother   . Cancer Brother   . Heart attack Brother   . Heart disease Sister        by pass    Social History:  reports that he has never smoked. He has never used smokeless tobacco. He reports that he does not drink alcohol and does not use drugs.  ROS: A complete review of systems was performed.  All systems are negative except for pertinent findings as noted.  Physical Exam:  Vital signs in last 24 hours: There were no vitals taken for this visit. Constitutional:  Alert and oriented, No acute distress Cardiovascular: Regular rate  Respiratory: Normal respiratory effort Neurologic: Grossly intact, no focal deficits  Psychiatric: Normal mood and affect  I have reviewed prior pt notes  I have reviewed notes from referring/previous physicians  I have reviewed urinalysis results  I have reviewed prior PSA results  Impression/Assessment:  Recurrent PCa followinng definitive Rx--PSA did not respond to Lupron  Significant LUTS  Plan:  1. Pt advised to discontinue alfuzosin, and started on oxybutynin.  2. Pt sent for x-ray (lumbar series) in one week and encouraged to increase his activity.  3. F/U in 6 months for OV, PSA, and symptom recheck.  4. He is adamantly against further Rx for his PCa  CC: Dr. Allyn Kenner

## 2020-02-16 ENCOUNTER — Ambulatory Visit (HOSPITAL_COMMUNITY)
Admission: RE | Admit: 2020-02-16 | Discharge: 2020-02-16 | Disposition: A | Discharge status: Home / Self Care | Payer: Medicare Other | Source: Ambulatory Visit | Attending: Urology | Admitting: Urology

## 2020-02-16 DIAGNOSIS — M545 Low back pain, unspecified: Secondary | ICD-10-CM

## 2020-02-16 DIAGNOSIS — C61 Malignant neoplasm of prostate: Secondary | ICD-10-CM | POA: Diagnosis not present

## 2020-02-17 NOTE — Progress Notes (Signed)
Letter sent.

## 2020-02-21 DIAGNOSIS — M545 Low back pain: Secondary | ICD-10-CM | POA: Diagnosis not present

## 2020-03-01 DIAGNOSIS — J069 Acute upper respiratory infection, unspecified: Secondary | ICD-10-CM | POA: Diagnosis not present

## 2020-03-09 ENCOUNTER — Emergency Department (HOSPITAL_COMMUNITY): Payer: Medicare Other

## 2020-03-09 ENCOUNTER — Encounter (HOSPITAL_COMMUNITY): Payer: Self-pay | Admitting: Emergency Medicine

## 2020-03-09 ENCOUNTER — Inpatient Hospital Stay (HOSPITAL_COMMUNITY)
Admission: EM | Admit: 2020-03-09 | Discharge: 2020-03-14 | DRG: 480 | Disposition: A | Discharge status: Skilled Nursing Facility | Payer: Medicare Other | Attending: Family Medicine | Admitting: Family Medicine

## 2020-03-09 ENCOUNTER — Other Ambulatory Visit: Payer: Self-pay

## 2020-03-09 DIAGNOSIS — W010XXA Fall on same level from slipping, tripping and stumbling without subsequent striking against object, initial encounter: Secondary | ICD-10-CM | POA: Diagnosis present

## 2020-03-09 DIAGNOSIS — Y9301 Activity, walking, marching and hiking: Secondary | ICD-10-CM | POA: Diagnosis present

## 2020-03-09 DIAGNOSIS — S72001A Fracture of unspecified part of neck of right femur, initial encounter for closed fracture: Secondary | ICD-10-CM | POA: Diagnosis not present

## 2020-03-09 DIAGNOSIS — E663 Overweight: Secondary | ICD-10-CM | POA: Diagnosis present

## 2020-03-09 DIAGNOSIS — S72001S Fracture of unspecified part of neck of right femur, sequela: Secondary | ICD-10-CM

## 2020-03-09 DIAGNOSIS — R5381 Other malaise: Secondary | ICD-10-CM | POA: Diagnosis not present

## 2020-03-09 DIAGNOSIS — M6281 Muscle weakness (generalized): Secondary | ICD-10-CM | POA: Diagnosis not present

## 2020-03-09 DIAGNOSIS — I1A0 Resistant hypertension: Secondary | ICD-10-CM

## 2020-03-09 DIAGNOSIS — Z79899 Other long term (current) drug therapy: Secondary | ICD-10-CM | POA: Diagnosis not present

## 2020-03-09 DIAGNOSIS — J9601 Acute respiratory failure with hypoxia: Secondary | ICD-10-CM | POA: Diagnosis present

## 2020-03-09 DIAGNOSIS — Z6829 Body mass index (BMI) 29.0-29.9, adult: Secondary | ICD-10-CM

## 2020-03-09 DIAGNOSIS — E785 Hyperlipidemia, unspecified: Secondary | ICD-10-CM | POA: Diagnosis not present

## 2020-03-09 DIAGNOSIS — I10 Essential (primary) hypertension: Secondary | ICD-10-CM | POA: Diagnosis not present

## 2020-03-09 DIAGNOSIS — F419 Anxiety disorder, unspecified: Secondary | ICD-10-CM | POA: Diagnosis present

## 2020-03-09 DIAGNOSIS — Z419 Encounter for procedure for purposes other than remedying health state, unspecified: Secondary | ICD-10-CM

## 2020-03-09 DIAGNOSIS — Z8711 Personal history of peptic ulcer disease: Secondary | ICD-10-CM | POA: Diagnosis not present

## 2020-03-09 DIAGNOSIS — D72828 Other elevated white blood cell count: Secondary | ICD-10-CM | POA: Diagnosis not present

## 2020-03-09 DIAGNOSIS — K5909 Other constipation: Secondary | ICD-10-CM | POA: Diagnosis not present

## 2020-03-09 DIAGNOSIS — Z8249 Family history of ischemic heart disease and other diseases of the circulatory system: Secondary | ICD-10-CM | POA: Diagnosis not present

## 2020-03-09 DIAGNOSIS — R0600 Dyspnea, unspecified: Secondary | ICD-10-CM | POA: Diagnosis present

## 2020-03-09 DIAGNOSIS — N179 Acute kidney failure, unspecified: Secondary | ICD-10-CM | POA: Diagnosis present

## 2020-03-09 DIAGNOSIS — N3281 Overactive bladder: Secondary | ICD-10-CM | POA: Diagnosis not present

## 2020-03-09 DIAGNOSIS — Z7982 Long term (current) use of aspirin: Secondary | ICD-10-CM

## 2020-03-09 DIAGNOSIS — D62 Acute posthemorrhagic anemia: Secondary | ICD-10-CM | POA: Diagnosis not present

## 2020-03-09 DIAGNOSIS — Z4789 Encounter for other orthopedic aftercare: Secondary | ICD-10-CM | POA: Diagnosis not present

## 2020-03-09 DIAGNOSIS — F339 Major depressive disorder, recurrent, unspecified: Secondary | ICD-10-CM | POA: Diagnosis not present

## 2020-03-09 DIAGNOSIS — S72001D Fracture of unspecified part of neck of right femur, subsequent encounter for closed fracture with routine healing: Secondary | ICD-10-CM | POA: Diagnosis not present

## 2020-03-09 DIAGNOSIS — R739 Hyperglycemia, unspecified: Secondary | ICD-10-CM | POA: Diagnosis present

## 2020-03-09 DIAGNOSIS — K219 Gastro-esophageal reflux disease without esophagitis: Secondary | ICD-10-CM | POA: Diagnosis present

## 2020-03-09 DIAGNOSIS — J189 Pneumonia, unspecified organism: Secondary | ICD-10-CM | POA: Diagnosis not present

## 2020-03-09 DIAGNOSIS — W19XXXA Unspecified fall, initial encounter: Secondary | ICD-10-CM | POA: Diagnosis not present

## 2020-03-09 DIAGNOSIS — J984 Other disorders of lung: Secondary | ICD-10-CM | POA: Diagnosis not present

## 2020-03-09 DIAGNOSIS — Z9181 History of falling: Secondary | ICD-10-CM | POA: Diagnosis not present

## 2020-03-09 DIAGNOSIS — Z20822 Contact with and (suspected) exposure to covid-19: Secondary | ICD-10-CM | POA: Diagnosis present

## 2020-03-09 DIAGNOSIS — H9190 Unspecified hearing loss, unspecified ear: Secondary | ICD-10-CM | POA: Diagnosis present

## 2020-03-09 DIAGNOSIS — R011 Cardiac murmur, unspecified: Secondary | ICD-10-CM | POA: Diagnosis present

## 2020-03-09 DIAGNOSIS — H9192 Unspecified hearing loss, left ear: Secondary | ICD-10-CM | POA: Diagnosis not present

## 2020-03-09 DIAGNOSIS — R339 Retention of urine, unspecified: Secondary | ICD-10-CM

## 2020-03-09 DIAGNOSIS — R262 Difficulty in walking, not elsewhere classified: Secondary | ICD-10-CM | POA: Diagnosis not present

## 2020-03-09 DIAGNOSIS — E876 Hypokalemia: Secondary | ICD-10-CM | POA: Diagnosis present

## 2020-03-09 DIAGNOSIS — Z809 Family history of malignant neoplasm, unspecified: Secondary | ICD-10-CM

## 2020-03-09 DIAGNOSIS — R488 Other symbolic dysfunctions: Secondary | ICD-10-CM | POA: Diagnosis not present

## 2020-03-09 DIAGNOSIS — C61 Malignant neoplasm of prostate: Secondary | ICD-10-CM | POA: Diagnosis not present

## 2020-03-09 DIAGNOSIS — S72041A Displaced fracture of base of neck of right femur, initial encounter for closed fracture: Secondary | ICD-10-CM | POA: Diagnosis not present

## 2020-03-09 DIAGNOSIS — E7800 Pure hypercholesterolemia, unspecified: Secondary | ICD-10-CM | POA: Diagnosis not present

## 2020-03-09 DIAGNOSIS — N281 Cyst of kidney, acquired: Secondary | ICD-10-CM | POA: Diagnosis not present

## 2020-03-09 DIAGNOSIS — N289 Disorder of kidney and ureter, unspecified: Secondary | ICD-10-CM

## 2020-03-09 DIAGNOSIS — R000 Tachycardia, unspecified: Secondary | ICD-10-CM | POA: Diagnosis not present

## 2020-03-09 DIAGNOSIS — Z8546 Personal history of malignant neoplasm of prostate: Secondary | ICD-10-CM | POA: Diagnosis not present

## 2020-03-09 DIAGNOSIS — D649 Anemia, unspecified: Secondary | ICD-10-CM | POA: Diagnosis not present

## 2020-03-09 DIAGNOSIS — Z741 Need for assistance with personal care: Secondary | ICD-10-CM | POA: Diagnosis not present

## 2020-03-09 DIAGNOSIS — F411 Generalized anxiety disorder: Secondary | ICD-10-CM | POA: Diagnosis not present

## 2020-03-09 DIAGNOSIS — J849 Interstitial pulmonary disease, unspecified: Secondary | ICD-10-CM | POA: Diagnosis not present

## 2020-03-09 DIAGNOSIS — J90 Pleural effusion, not elsewhere classified: Secondary | ICD-10-CM | POA: Diagnosis not present

## 2020-03-09 DIAGNOSIS — R52 Pain, unspecified: Secondary | ICD-10-CM | POA: Diagnosis not present

## 2020-03-09 DIAGNOSIS — R079 Chest pain, unspecified: Secondary | ICD-10-CM | POA: Diagnosis not present

## 2020-03-09 HISTORY — DX: Fracture of unspecified part of neck of right femur, initial encounter for closed fracture: S72.001A

## 2020-03-09 LAB — URINALYSIS, ROUTINE W REFLEX MICROSCOPIC
Bacteria, UA: NONE SEEN
Bilirubin Urine: NEGATIVE
Glucose, UA: NEGATIVE mg/dL
Hgb urine dipstick: NEGATIVE
Ketones, ur: NEGATIVE mg/dL
Leukocytes,Ua: NEGATIVE
Nitrite: NEGATIVE
Protein, ur: 30 mg/dL — AB
Specific Gravity, Urine: 1.019 (ref 1.005–1.030)
pH: 6 (ref 5.0–8.0)

## 2020-03-09 LAB — HIV ANTIBODY (ROUTINE TESTING W REFLEX): HIV Screen 4th Generation wRfx: NONREACTIVE

## 2020-03-09 LAB — GLUCOSE, CAPILLARY
Glucose-Capillary: 150 mg/dL — ABNORMAL HIGH (ref 70–99)
Glucose-Capillary: 167 mg/dL — ABNORMAL HIGH (ref 70–99)
Glucose-Capillary: 188 mg/dL — ABNORMAL HIGH (ref 70–99)

## 2020-03-09 LAB — CBC WITH DIFFERENTIAL/PLATELET
Abs Immature Granulocytes: 0.07 10*3/uL (ref 0.00–0.07)
Basophils Absolute: 0 10*3/uL (ref 0.0–0.1)
Basophils Relative: 0 %
Eosinophils Absolute: 0 10*3/uL (ref 0.0–0.5)
Eosinophils Relative: 0 %
HCT: 37.7 % — ABNORMAL LOW (ref 39.0–52.0)
Hemoglobin: 12.1 g/dL — ABNORMAL LOW (ref 13.0–17.0)
Immature Granulocytes: 1 %
Lymphocytes Relative: 4 %
Lymphs Abs: 0.5 10*3/uL — ABNORMAL LOW (ref 0.7–4.0)
MCH: 25.8 pg — ABNORMAL LOW (ref 26.0–34.0)
MCHC: 32.1 g/dL (ref 30.0–36.0)
MCV: 80.4 fL (ref 80.0–100.0)
Monocytes Absolute: 1 10*3/uL (ref 0.1–1.0)
Monocytes Relative: 7 %
Neutro Abs: 12.2 10*3/uL — ABNORMAL HIGH (ref 1.7–7.7)
Neutrophils Relative %: 88 %
Platelets: 205 10*3/uL (ref 150–400)
RBC: 4.69 MIL/uL (ref 4.22–5.81)
RDW: 14.6 % (ref 11.5–15.5)
WBC: 13.7 10*3/uL — ABNORMAL HIGH (ref 4.0–10.5)
nRBC: 0 % (ref 0.0–0.2)

## 2020-03-09 LAB — PROTIME-INR
INR: 1.1 (ref 0.8–1.2)
Prothrombin Time: 13.5 seconds (ref 11.4–15.2)

## 2020-03-09 LAB — RESPIRATORY PANEL BY RT PCR (FLU A&B, COVID)
Influenza A by PCR: NEGATIVE
Influenza B by PCR: NEGATIVE
SARS Coronavirus 2 by RT PCR: NEGATIVE

## 2020-03-09 LAB — HEMOGLOBIN A1C
Hgb A1c MFr Bld: 5.9 % — ABNORMAL HIGH (ref 4.8–5.6)
Mean Plasma Glucose: 122.63 mg/dL

## 2020-03-09 LAB — BASIC METABOLIC PANEL
Anion gap: 10 (ref 5–15)
BUN: 21 mg/dL (ref 8–23)
CO2: 31 mmol/L (ref 22–32)
Calcium: 9.2 mg/dL (ref 8.9–10.3)
Chloride: 100 mmol/L (ref 98–111)
Creatinine, Ser: 1.5 mg/dL — ABNORMAL HIGH (ref 0.61–1.24)
GFR calc Af Amer: 47 mL/min — ABNORMAL LOW (ref >60)
GFR calc non Af Amer: 41 mL/min — ABNORMAL LOW (ref >60)
Glucose, Bld: 208 mg/dL — ABNORMAL HIGH (ref 70–99)
Potassium: 3.2 mmol/L — ABNORMAL LOW (ref 3.5–5.1)
Sodium: 141 mmol/L (ref 135–145)

## 2020-03-09 LAB — STREP PNEUMONIAE URINARY ANTIGEN: Strep Pneumo Urinary Antigen: NEGATIVE

## 2020-03-09 LAB — SURGICAL PCR SCREEN
MRSA, PCR: NEGATIVE
Staphylococcus aureus: NEGATIVE

## 2020-03-09 LAB — TYPE AND SCREEN
ABO/RH(D): O POS
Antibody Screen: NEGATIVE

## 2020-03-09 LAB — CBG MONITORING, ED: Glucose-Capillary: 156 mg/dL — ABNORMAL HIGH (ref 70–99)

## 2020-03-09 MED ORDER — CALCIUM CARBONATE ANTACID 500 MG PO CHEW: 1.0000 | CHEWABLE_TABLET | Freq: Two times a day (BID) | ORAL | Status: DC | PRN

## 2020-03-09 MED ORDER — FENTANYL CITRATE (PF) 100 MCG/2ML IJ SOLN
50.0000 ug | INTRAMUSCULAR | Status: AC | PRN
  Administered 2020-03-09 (×2): 50 ug via INTRAVENOUS
  Filled 2020-03-09 (×2): qty 2

## 2020-03-09 MED ORDER — SODIUM CHLORIDE 0.9 % IV SOLN
500.0000 mg | INTRAVENOUS | Status: DC
  Administered 2020-03-09 – 2020-03-13 (×4): 500 mg via INTRAVENOUS
  Filled 2020-03-09 (×4): qty 500

## 2020-03-09 MED ORDER — HYDROCHLOROTHIAZIDE 25 MG PO TABS
25.0000 mg | ORAL_TABLET | Freq: Every day | ORAL | Status: DC
  Administered 2020-03-09 – 2020-03-11 (×2): 25 mg via ORAL
  Filled 2020-03-09 (×3): qty 1

## 2020-03-09 MED ORDER — MORPHINE SULFATE (PF) 4 MG/ML IV SOLN
4.0000 mg | INTRAVENOUS | Status: DC | PRN
  Administered 2020-03-09 (×2): 4 mg via INTRAVENOUS
  Filled 2020-03-09 (×2): qty 1

## 2020-03-09 MED ORDER — CHLORHEXIDINE GLUCONATE 4 % EX LIQD
60.0000 mL | Freq: Once | CUTANEOUS | Status: DC
  Filled 2020-03-09: qty 15

## 2020-03-09 MED ORDER — ONDANSETRON HCL 4 MG/2ML IJ SOLN
4.0000 mg | Freq: Once | INTRAMUSCULAR | Status: AC
  Administered 2020-03-09: 4 mg via INTRAVENOUS
  Filled 2020-03-09: qty 2

## 2020-03-09 MED ORDER — ONDANSETRON HCL 4 MG/2ML IJ SOLN
4.0000 mg | Freq: Three times a day (TID) | INTRAMUSCULAR | Status: DC | PRN
  Administered 2020-03-10: 4 mg via INTRAVENOUS
  Filled 2020-03-09: qty 2

## 2020-03-09 MED ORDER — INFLUENZA VAC A&B SA ADJ QUAD 0.5 ML IM PRSY
0.5000 mL | PREFILLED_SYRINGE | INTRAMUSCULAR | Status: DC
  Filled 2020-03-09: qty 0.5

## 2020-03-09 MED ORDER — POVIDONE-IODINE 10 % EX SWAB: 2.0000 "application " | Freq: Once | CUTANEOUS | Status: DC

## 2020-03-09 MED ORDER — SODIUM CHLORIDE 0.9 % IV SOLN: INTRAVENOUS | Status: DC

## 2020-03-09 MED ORDER — VENLAFAXINE HCL ER 75 MG PO CP24
75.0000 mg | ORAL_CAPSULE | Freq: Every day | ORAL | Status: DC
  Administered 2020-03-09 – 2020-03-14 (×5): 75 mg via ORAL
  Filled 2020-03-09 (×5): qty 1

## 2020-03-09 MED ORDER — LORAZEPAM 1 MG PO TABS
1.0000 mg | ORAL_TABLET | Freq: Two times a day (BID) | ORAL | Status: DC | PRN
  Administered 2020-03-09 – 2020-03-11 (×2): 1 mg via ORAL
  Filled 2020-03-09 (×3): qty 1

## 2020-03-09 MED ORDER — ONDANSETRON 4 MG PO TBDP: 4.0000 mg | ORAL_TABLET | Freq: Three times a day (TID) | ORAL | Status: DC | PRN

## 2020-03-09 MED ORDER — INSULIN ASPART 100 UNIT/ML ~~LOC~~ SOLN
0.0000 [IU] | Freq: Three times a day (TID) | SUBCUTANEOUS | Status: DC
  Administered 2020-03-09 – 2020-03-11 (×4): 1 [IU] via SUBCUTANEOUS

## 2020-03-09 MED ORDER — TERAZOSIN HCL 5 MG PO CAPS
5.0000 mg | ORAL_CAPSULE | Freq: Two times a day (BID) | ORAL | Status: DC
  Administered 2020-03-09 – 2020-03-14 (×10): 5 mg via ORAL
  Filled 2020-03-09 (×13): qty 1

## 2020-03-09 MED ORDER — CEFAZOLIN SODIUM-DEXTROSE 2-4 GM/100ML-% IV SOLN: 2.0000 g | INTRAVENOUS | Status: DC

## 2020-03-09 MED ORDER — PANTOPRAZOLE SODIUM 40 MG PO TBEC
40.0000 mg | DELAYED_RELEASE_TABLET | Freq: Every day | ORAL | Status: DC
  Administered 2020-03-09 – 2020-03-14 (×5): 40 mg via ORAL
  Filled 2020-03-09 (×5): qty 1

## 2020-03-09 MED ORDER — DILTIAZEM HCL ER COATED BEADS 120 MG PO CP24
240.0000 mg | ORAL_CAPSULE | Freq: Every day | ORAL | Status: DC
  Administered 2020-03-09 – 2020-03-14 (×5): 240 mg via ORAL
  Filled 2020-03-09 (×4): qty 2
  Filled 2020-03-09: qty 1
  Filled 2020-03-09: qty 2
  Filled 2020-03-09: qty 1

## 2020-03-09 MED ORDER — POTASSIUM CHLORIDE CRYS ER 20 MEQ PO TBCR
20.0000 meq | EXTENDED_RELEASE_TABLET | Freq: Once | ORAL | Status: AC
  Administered 2020-03-09: 20 meq via ORAL
  Filled 2020-03-09: qty 1

## 2020-03-09 MED ORDER — LISINOPRIL 10 MG PO TABS
40.0000 mg | ORAL_TABLET | Freq: Every day | ORAL | Status: DC
  Administered 2020-03-09 – 2020-03-12 (×3): 40 mg via ORAL
  Filled 2020-03-09 (×3): qty 4

## 2020-03-09 MED ORDER — MUPIROCIN 2 % EX OINT: 1.0000 "application " | TOPICAL_OINTMENT | Freq: Two times a day (BID) | CUTANEOUS | Status: DC

## 2020-03-09 MED ORDER — INSULIN ASPART 100 UNIT/ML ~~LOC~~ SOLN
0.0000 [IU] | SUBCUTANEOUS | Status: DC
  Administered 2020-03-09: 1 [IU] via SUBCUTANEOUS
  Filled 2020-03-09: qty 1

## 2020-03-09 MED ORDER — ASPIRIN EC 81 MG PO TBEC: 81.0000 mg | DELAYED_RELEASE_TABLET | ORAL | Status: DC

## 2020-03-09 MED ORDER — LABETALOL HCL 5 MG/ML IV SOLN: 10.0000 mg | INTRAVENOUS | Status: DC | PRN

## 2020-03-09 MED ORDER — SODIUM CHLORIDE 0.9 % IV SOLN
2.0000 g | INTRAVENOUS | Status: AC
  Administered 2020-03-09 – 2020-03-13 (×5): 2 g via INTRAVENOUS
  Filled 2020-03-09 (×5): qty 20

## 2020-03-09 MED ORDER — SIMVASTATIN 20 MG PO TABS
10.0000 mg | ORAL_TABLET | Freq: Every day | ORAL | Status: DC
  Administered 2020-03-09 – 2020-03-13 (×5): 10 mg via ORAL
  Filled 2020-03-09 (×5): qty 1

## 2020-03-09 MED ORDER — HYDROMORPHONE HCL 1 MG/ML IJ SOLN
0.5000 mg | INTRAMUSCULAR | Status: DC | PRN
  Administered 2020-03-09 – 2020-03-10 (×4): 0.5 mg via INTRAVENOUS
  Filled 2020-03-09 (×4): qty 0.5

## 2020-03-09 MED ORDER — ASPIRIN EC 81 MG PO TBEC
81.0000 mg | DELAYED_RELEASE_TABLET | ORAL | Status: DC
  Administered 2020-03-09: 81 mg via ORAL
  Filled 2020-03-09: qty 1

## 2020-03-09 MED ORDER — POTASSIUM CHLORIDE 10 MEQ/100ML IV SOLN
10.0000 meq | INTRAVENOUS | Status: AC
  Administered 2020-03-09 (×3): 10 meq via INTRAVENOUS
  Filled 2020-03-09 (×3): qty 100

## 2020-03-09 MED ORDER — HEPARIN SODIUM (PORCINE) 5000 UNIT/ML IJ SOLN
5000.0000 [IU] | Freq: Three times a day (TID) | INTRAMUSCULAR | Status: DC
  Administered 2020-03-09 (×3): 5000 [IU] via SUBCUTANEOUS
  Filled 2020-03-09 (×3): qty 1

## 2020-03-09 NOTE — Anesthesia Preprocedure Evaluation (Addendum)
Anesthesia Evaluation  Patient identified by MRN, date of birth, ID band Patient awake    Reviewed: Allergy & Precautions, NPO status , Patient's Chart, lab work & pertinent test results  History of Anesthesia Complications Negative for: history of anesthetic complications  Airway Mallampati: II  TM Distance: >3 FB Neck ROM: Full    Dental  (+) Edentulous Upper, Missing, Dental Advisory Given   Pulmonary shortness of breath and with exertion, pneumonia (on 3l of O2),  IMPRESSION: Peribronchial thickening with streaky perihilar infiltrates suggesting bronchitis or multifocal pneumonia.   Electronically Signed   By: Lucienne Capers M.D.   On: 03/09/2020 02:47   Pulmonary exam normal breath sounds clear to auscultation       Cardiovascular Exercise Tolerance: Good hypertension, Pt. on medications Normal cardiovascular exam+ Valvular Problems/Murmurs  Rhythm:Regular Rate:Normal  09-Mar-2020 02:50:13 [facility] ROUTINE RECORD Marked sinus bradycardia Left axis deviation Abnormal R-wave progression, late transition Borderline repolarization abnormality Otherwise no significant change Confirmed by Ripley Fraise [phone]) on 03/09/2020 3:31:24 AM   Neuro/Psych Anxiety negative neurological ROS     GI/Hepatic PUD, GERD  Medicated,  Endo/Other  negative endocrine ROS  Renal/GU Renal disease     Musculoskeletal   Abdominal   Peds  Hematology  (+) anemia ,   Anesthesia Other Findings   Reproductive/Obstetrics                            Anesthesia Physical Anesthesia Plan  ASA: IV  Anesthesia Plan: Spinal and General   Post-op Pain Management:    Induction: Intravenous  PONV Risk Score and Plan: 3 and Ondansetron  Airway Management Planned: Nasal Cannula, Natural Airway and Simple Face Mask  Additional Equipment:   Intra-op Plan:   Post-operative Plan:    Informed Consent: I have reviewed the patients History and Physical, chart, labs and discussed the procedure including the risks, benefits and alternatives for the proposed anesthesia with the patient or authorized representative who has indicated his/her understanding and acceptance.     Dental advisory given  Plan Discussed with: CRNA and Surgeon  Anesthesia Plan Comments: (Discussed with Dr. Wynetta Emery regarding continuing IV fluids until tomorrow,  Dr. Wynetta Emery will order echo today and will review.)       Anesthesia Quick Evaluation

## 2020-03-09 NOTE — Plan of Care (Signed)

## 2020-03-09 NOTE — H&P (Signed)
TRH H&P    Patient Demographics:    Jeffrey Sherman, is a 86 y.o. male  MRN: [id number]  DOB - [birth year]  Admit Date - 03/09/2020  Referring MD/NP/PA: Dr. Christy Gentles  Outpatient Primary MD for the patient is Celene Squibb, MD  Patient coming from: Home  Chief complaint- Fall and hip pain   HPI:    Jeffrey Sherman  is a 86 y.o. male, with history of peptic ulcer disease, hypertension, hard of hearing, hyperlipidemia, GERD, anxiety, and more presents to the ED with a chief complaint of fall and right hip pain.  Patient reports that at 7:30 PM he was walking to the kitchen when he tripped over the threshold in the doorway, and landed on his right hip.  Patient reports that he instantly felt sharp pain, especially when moving the extremity, but was trying not to come into the ER.  His family was unable to get him off of the commode around 12:30 AM so they called EMS.  In the interim they had given him hydrocodone, and a muscle relaxer, and neither had much of an effect on his pain.  He reports that at rest his pain is a 5-6 out of 10, and when he is moving it severe.  Patient reports that before his fall he had no lightheadedness, dizziness, chest pain, palpitations, shortness of breath.  After his fall he did have nausea that he attributes to the pain.  Patient reports that he has had a cough over the last week.  He went to PCP virtual visit and was given a Z-Pak.  This did not improve his cough.  Patient has no other acute complaints at this time.  In the ED Temperature 97.9, heart rate 58, respiratory rate 17, blood pressure 169/74 White blood cell count 13.7, hemoglobin 12.1 Hypokalemia at 3.2 AKI with a creatinine of 1.50 EKG shows heart rate 53, sinus bradycardia, QTC 461 Respiratory panel pending X-ray hip shows minimally displaced right femoral neck fracture Chest x-ray shows peribronchial thickening with streaky  perihilar infiltrates suggesting bronchitis or multifocal pneumonia 2 L nasal cannula applied Fentanyl given for pain     Review of systems:    In addition to the HPI above,  No Fever-chills, No Headache, No changes with Vision or hearing, No problems swallowing food or Liquids, Positive for chronic chest pain, and dry cough No Abdominal pain, No  Vomiting, admits to nausea after fall , bowel movements are regular, No Blood in stool or Urine, No dysuria, No new skin rashes or bruises, New right hip pain,  No new weakness, tingling, numbness in any extremity, No recent weight gain or loss, No polyuria, polydypsia or polyphagia, No significant Mental Stressors.  All other systems reviewed and are negative.    Past History of the following :    Past Medical History:  Diagnosis Date  . Anxiety   . Cancer [facility])    prostate  . Chronic chest wall pain   . Essential hypertension, benign 06/20/2016  . GERD (gastroesophageal reflux disease)   .  H/O echocardiogram 2016   normal  . Heart murmur    "leaking heart valve"  . High cholesterol 06/20/2016  . HOH (hard of hearing)    left  . Hypertension   . Poor historian   . PUD (peptic ulcer disease)   . Shortness of breath       Past Surgical History:  Procedure Laterality Date  . ESOPHAGOGASTRODUODENOSCOPY    . ESOPHAGOGASTRODUODENOSCOPY N/A 07/18/2016   Procedure: ESOPHAGOGASTRODUODENOSCOPY (EGD);  Surgeon: Rogene Houston, MD;  Location: [facility] ENDO SUITE;  Service: Endoscopy;  Laterality: N/A;  3:00-moved to 230 per Lelon Frohlich  . ORIF WRIST FRACTURE  12/19/2011   Procedure: OPEN REDUCTION INTERNAL FIXATION (ORIF) WRIST FRACTURE;  Surgeon: Carole Civil, MD;  Location: [facility] ORS;  Service: Orthopedics;  Laterality: Right;  . prostate cancer     Diagnosed in the 90s  . TONSILLECTOMY        Social History:      Social History   Tobacco Use  . Smoking status: Never Smoker  . Smokeless tobacco: Never Used  Substance Use  Topics  . Alcohol use: No       Family History :     Family History  Problem Relation Age of Onset  . Cancer Other   . Heart attack Mother   . Cancer Brother   . Heart attack Brother   . Heart disease Sister        by pass      Home Medications:   Prior to Admission medications   Medication Sig Start Date End Date Taking? Authorizing Provider  alfuzosin (UROXATRAL) 10 MG 24 hr tablet Take 10 mg by mouth daily with breakfast.    [provider]  aspirin EC 81 MG tablet Take 81 mg by mouth every morning.    [provider]  calcium carbonate (TUMS - DOSED IN MG ELEMENTAL CALCIUM) 500 MG chewable tablet Chew 1 tablet by mouth 2 (two) times daily as needed for indigestion or heartburn.     [provider]  cholecalciferol (VITAMIN D) 1000 units tablet Take 1,000 Units by mouth daily.    [provider]  diltiazem (CARDIZEM CD) 240 MG 24 hr capsule Take 240 mg by mouth daily.  11/28/17   [provider]  diphenhydramine-acetaminophen (TYLENOL PM) 25-500 MG TABS tablet Take 1 tablet by mouth at bedtime.     [provider]  Flax Oil-Fish Oil-Borage Oil (FISH OIL-FLAX OIL-BORAGE OIL) CAPS Take 1 capsule by mouth daily.    [provider]  hydrochlorothiazide (HYDRODIURIL) 25 MG tablet Take 25 mg by mouth daily.    [provider]  HYDROcodone-acetaminophen (NORCO/VICODIN) 5-325 MG tablet Take 1 tablet by mouth every 6 (six) hours as needed. 12/28/19   Nuala Alpha A, PA-C  lidocaine (XYLOCAINE) 5 % ointment  09/28/19   [provider]  lisinopril (PRINIVIL,ZESTRIL) 40 MG tablet Take 40 mg by mouth 2 (two) times daily.    [provider]  LORazepam (ATIVAN) 1 MG tablet Take 1 mg by mouth 2 (two) times daily as needed for anxiety.     [provider]  meclizine (ANTIVERT) 25 MG tablet Take 25 mg by mouth as needed.  11/14/17   [provider]  Melatonin 10 MG TABS Take 10 mg by mouth  at bedtime.    [provider]  omeprazole (PRILOSEC) 20 MG capsule Take 1 capsule (20 mg total) by mouth daily before breakfast. Patient taking differently: Take 40 mg  by mouth daily before breakfast.  12/23/17   Rehman, Mechele Dawley, MD  oxybutynin (DITROPAN) 5 MG tablet Take 1 tablet (5 mg total) by mouth every 8 (eight) hours as needed (urinary frequency/urgency). 02/15/20   Franchot Gallo, MD  potassium chloride SA (K-DUR,KLOR-CON) 20 MEQ tablet Take 20 mEq by mouth 2 (two) times daily.    [provider]  simvastatin (ZOCOR) 10 MG tablet Take 1 tablet (10 mg total) by mouth at bedtime. 11/23/17   Barton Dubois, MD  terazosin (HYTRIN) 5 MG capsule Take 5 mg by mouth 2 (two) times daily. 09/14/17   [provider]  triamcinolone cream (KENALOG) 0.1 % APPLY TO AFFECTED AREASWTHREE TIMES DAILY AS NEEDED FOR RASH. 09/15/19   [provider]  venlafaxine XR (EFFEXOR-XR) 75 MG 24 hr capsule Take 75 mg by mouth daily.    [provider]  vitamin C (ASCORBIC ACID) 500 MG tablet Take 1,000 mg by mouth daily.    [provider]     Allergies:    No Known Allergies   Physical Exam:   Vitals  Blood pressure (!) 185/78, pulse 72, temperature 97.9 F (36.6 C), temperature source Oral, resp. rate 14, height 5\' 9"  (1.753 m), weight 90 kg, SpO2 98 %.  1.  General: Lying supine in bed in no acute distress  2. Psychiatric: Mood and behavior normal for situation Oriented to person and place  3. Neurologic: Cranial nerves II through XII are grossly intact, equal sensation in the upper and lower extremities bilaterally  4. HEENMT:  Head is atraumatic, normocephalic, pupils are reactive to light, neck is supple, trachea is midline, mucous membranes are moist  5. Respiratory : Lungs are diminished in the lower lung fields bilaterally No wheeze rhonchi or crackles  6. Cardiovascular : Heart rate is bradycardic, systolic murmur, no rubs or  gallops Rhythm is regular Trace peripheral edema bilaterally  7. Gastrointestinal:  Abdomen is soft, nondistended, nontender to palpation Bowel sounds active  8. Skin:  No acute lesions on limited skin exam  9.Musculoskeletal:  Right leg minimally externally rotated no bruising, tender to palpation over proximal femur    Data Review:    CBC Recent Labs  Lab 03/09/20 0259  WBC 13.7*  HGB 12.1*  HCT 37.7*  PLT 205  MCV 80.4  MCH 25.8*  MCHC 32.1  RDW 14.6  LYMPHSABS 0.5*  MONOABS 1.0  EOSABS 0.0  BASOSABS 0.0   ------------------------------------------------------------------------------------------------------------------  Results for orders placed or performed during the hospital encounter of 03/09/20 (from the past 48 hour(s))  Basic metabolic panel     Status: Abnormal   Collection Time: 03/09/20  2:59 AM  Result Value Ref Range   Sodium 141 135 - 145 mmol/L   Potassium 3.2 (L) 3.5 - 5.1 mmol/L   Chloride 100 98 - 111 mmol/L   CO2 31 22 - 32 mmol/L   Glucose, Bld 208 (H) 70 - 99 mg/dL    Comment: Glucose reference range applies only to samples taken after fasting for at least 8 hours.   BUN 21 8 - 23 mg/dL   Creatinine, Ser 1.50 (H) 0.61 - 1.24 mg/dL   Calcium 9.2 8.9 - 10.3 mg/dL   GFR calc non Af Amer 41 (L) >60 mL/min   GFR calc Af Amer 47 (L) >60 mL/min   Anion gap 10 5 - 15    Comment: Performed at [facility], [address] [place], [place] [address]  CBC WITH DIFFERENTIAL  Status: Abnormal   Collection Time: 03/09/20  2:59 AM  Result Value Ref Range   WBC 13.7 (H) 4.0 - 10.5 K/uL   RBC 4.69 4.22 - 5.81 MIL/uL   Hemoglobin 12.1 (L) 13.0 - 17.0 g/dL   HCT 37.7 (L) 39 - 52 %   MCV 80.4 80.0 - 100.0 fL   MCH 25.8 (L) 26.0 - 34.0 pg   MCHC 32.1 30.0 - 36.0 g/dL   RDW 14.6 11.5 - 15.5 %   Platelets 205 150 - 400 K/uL   nRBC 0.0 0.0 - 0.2 %   Neutrophils Relative % 88 %   Neutro Abs 12.2 (H) 1.7 - 7.7 K/uL   Lymphocytes Relative 4 %    Lymphs Abs 0.5 (L) 0.7 - 4.0 K/uL   Monocytes Relative 7 %   Monocytes Absolute 1.0 0 - 1 K/uL   Eosinophils Relative 0 %   Eosinophils Absolute 0.0 0 - 0 K/uL   Basophils Relative 0 %   Basophils Absolute 0.0 0 - 0 K/uL   Immature Granulocytes 1 %   Abs Immature Granulocytes 0.07 0.00 - 0.07 K/uL    Comment: Performed at [facility], [address] [place], [place] [address]  Protime-INR     Status: None   Collection Time: 03/09/20  2:59 AM  Result Value Ref Range   Prothrombin Time 13.5 11.4 - 15.2 seconds   INR 1.1 0.8 - 1.2    Comment: (NOTE) INR goal varies based on device and disease states. Performed at [facility], [address], [place], [place] [address]   Type and screen [facility]     Status: None   Collection Time: 03/09/20  2:59 AM  Result Value Ref Range   ABO/RH(D) O POS    Antibody Screen NEG    Sample Expiration      03/12/2020,2359 Performed at [facility], [address] [place], [place] [address]   Respiratory Panel by RT PCR (Flu A&B, Covid) - Nasopharyngeal Swab     Status: None   Collection Time: 03/09/20  3:55 AM   Specimen: Nasopharyngeal Swab  Result Value Ref Range   SARS Coronavirus 2 by RT PCR NEGATIVE NEGATIVE    Comment: (NOTE) SARS-CoV-2 target nucleic acids are NOT DETECTED.  The SARS-CoV-2 RNA is generally detectable in upper respiratoy specimens during the acute phase of infection. The lowest concentration of SARS-CoV-2 viral copies this assay can detect is 131 copies/mL. A negative result does not preclude SARS-Cov-2 infection and should not be used as the sole basis for treatment or other patient management decisions. A negative result may occur with  improper specimen collection/handling, submission of specimen other than nasopharyngeal swab, presence of viral mutation(s) within the areas targeted by this assay, and inadequate number of viral copies (<131 copies/mL). A negative result must be combined with  clinical observations, patient history, and epidemiological information. The expected result is Negative.  Fact Sheet for Patients:  PinkCheek.be  Fact Sheet for Healthcare Providers:  GravelBags.it  This test is no t yet approved or cleared by the [place] FDA and  has been authorized for detection and/or diagnosis of SARS-CoV-2 by FDA under an Emergency Use Authorization (EUA). This EUA will remain  in effect (meaning this test can be used) for the duration of the COVID-19 declaration under Section 564(b)(1) of the Act, 21 U.S.C. section 360bbb-3(b)(1), unless the authorization is terminated or revoked sooner.     Influenza A by PCR NEGATIVE NEGATIVE  Influenza B by PCR NEGATIVE NEGATIVE    Comment: (NOTE) The Xpert Xpress SARS-CoV-2/FLU/RSV assay is intended as an aid in  the diagnosis of influenza from Nasopharyngeal swab specimens and  should not be used as a sole basis for treatment. Nasal washings and  aspirates are unacceptable for Xpert Xpress SARS-CoV-2/FLU/RSV  testing.  Fact Sheet for Patients: PinkCheek.be  Fact Sheet for Healthcare Providers: GravelBags.it  This test is not yet approved or cleared by the [place] FDA and  has been authorized for detection and/or diagnosis of SARS-CoV-2 by  FDA under an Emergency Use Authorization (EUA). This EUA will remain  in effect (meaning this test can be used) for the duration of the  Covid-19 declaration under Section 564(b)(1) of the Act, 21  U.S.C. section 360bbb-3(b)(1), unless the authorization is  terminated or revoked. Performed at [facility], [address], [place], [place] [address]     Chemistries  Recent Labs  Lab 03/09/20 0259  NA 141  K 3.2*  CL 100  CO2 31  GLUCOSE 208*  BUN 21  CREATININE 1.50*  CALCIUM 9.2    ------------------------------------------------------------------------------------------------------------------  ------------------------------------------------------------------------------------------------------------------ GFR: Estimated Creatinine Clearance: 37.7 mL/min (A) (by C-G formula based on SCr of 1.5 mg/dL (H)). Liver Function Tests: No results for input(s): AST, ALT, ALKPHOS, BILITOT, PROT, ALBUMIN in the last 168 hours. No results for input(s): LIPASE, AMYLASE in the last 168 hours. No results for input(s): AMMONIA in the last 168 hours. Coagulation Profile: Recent Labs  Lab 03/09/20 0259  INR 1.1   Cardiac Enzymes: No results for input(s): CKTOTAL, CKMB, CKMBINDEX, TROPONINI in the last 168 hours. BNP (last 3 results) No results for input(s): PROBNP in the last 8760 hours. HbA1C: No results for input(s): HGBA1C in the last 72 hours. CBG: No results for input(s): GLUCAP in the last 168 hours. Lipid Profile: No results for input(s): CHOL, HDL, LDLCALC, TRIG, CHOLHDL, LDLDIRECT in the last 72 hours. Thyroid Function Tests: No results for input(s): TSH, T4TOTAL, FREET4, T3FREE, THYROIDAB in the last 72 hours. Anemia Panel: No results for input(s): VITAMINB12, FOLATE, FERRITIN, TIBC, IRON, RETICCTPCT in the last 72 hours.  --------------------------------------------------------------------------------------------------------------- Urine analysis:    Component Value Date/Time   COLORURINE YELLOW 06/06/2018 1840   APPEARANCEUR Clear 02/15/2020 1321   LABSPEC 1.018 06/06/2018 1840   PHURINE 5.0 06/06/2018 1840   GLUCOSEU Negative 02/15/2020 1321   HGBUR NEGATIVE 06/06/2018 1840   BILIRUBINUR Negative 02/15/2020 1321   KETONESUR NEGATIVE 06/06/2018 1840   PROTEINUR 1+ (A) 02/15/2020 1321   PROTEINUR 30 (A) 06/06/2018 1840   UROBILINOGEN negative (A) 10/19/2019 0958   UROBILINOGEN 0.2 05/08/2014 2045   NITRITE Negative 02/15/2020 1321   NITRITE NEGATIVE  06/06/2018 1840   LEUKOCYTESUR Negative 02/15/2020 1321      Imaging Results:    DG Chest Port 1 View  Result Date: 03/09/2020 CLINICAL DATA:  Right hip pain after a fall. History of prostate cancer. EXAM: PORTABLE CHEST 1 VIEW COMPARISON:  06/06/2018 FINDINGS: Heart size and pulmonary vascularity are normal. Peribronchial thickening with streaky perihilar infiltrates. This could represent bronchitis or multifocal pneumonia. No focal consolidation. No pleural effusions. No pneumothorax. Mediastinal contours appear intact. Calcified and tortuous aorta. Degenerative changes in the spine and shoulders. IMPRESSION: Peribronchial thickening with streaky perihilar infiltrates suggesting bronchitis or multifocal pneumonia. Electronically Signed   By: Lucienne Capers M.D.   On: 03/09/2020 02:47   DG Hip Unilat W or Wo Pelvis 2-3 Views Right  Result Date: 03/09/2020 CLINICAL DATA:  Right hip pain after fall EXAM: DG HIP (WITH OR WITHOUT PELVIS) 2-3V RIGHT COMPARISON:  None. FINDINGS: There is a minimally displaced fracture of the right femoral neck with mild shortening. No dislocation. No other pelvic fracture. Left hip normal. IMPRESSION: Minimally displaced right femoral neck fracture. Electronically Signed   By: Ulyses Jarred M.D.   On: 03/09/2020 02:45    My personal review of EKG: Rhythm SB, Rate 53 /min, QTc 461 ,no Acute ST changes   Assessment & Plan:    Active Problems:   Closed hip fracture, right, initial encounter ([place])   1. Right closed hip fracture 1. X-ray shows minimally displaced right femoral neck fracture 2. Secondary to mechanical fall 3. Pain control with morphine 4. Consult to Ortho -plan to see this morning 5. EKG shows sinus bradycardia, patient is asymptomatic, QTC is 461 6. Chest x-ray showed questionable pneumonia, bronchitis 7. UA pending 8. Blood type O+ 9. Revised cardiac risk index for preoperative risk = 3.9 % 10. 30-day risk of death, MI, or cardiac  arrest 2. Respiratory failure with hypoxia 1. Saturations down to 89% 2. Improved with 2LNC 3. Patient reports that he doesn't wear O2 at home 4. Likely 2/2 CAP 5. Wean off O2 as tolerated 6. Start Rocephin and Zithromax per CAP protocol 7. Sputum culture pending 8. Continue to monitor 3. Community-acquired pneumonia 1. Start Rocephin and Zithromax 2. Sputum culture 3. Continue to monitor 4. Hypokalemia 1. Replace and recheck 5. AKI 1. Baseline Cr 1.18 2. Today 1.5 3. Continue IV fluids 4. Continue to monitor 6. Hyperglycemia 1. Glucose 200 without diagnosis of DMII 2. Stress reaction? 3. Hgb A1C pending 7. HTN 1. Continue home medications    DVT Prophylaxis-Heparin- SCDs   AM Labs Ordered, also please review Full Orders  Family Communication: Admission, patients condition and plan of care including tests being ordered have been discussed with the patient and daughter-in-law who indicate understanding and agree with the plan and Code Status.  Code Status: Full code  Admission status: Inpatient :The appropriate admission status for this patient is INPATIENT. Inpatient status is judged to be reasonable and necessary in order to provide the required intensity of service to ensure the patient's safety. The patient's presenting symptoms, physical exam findings, and initial radiographic and laboratory data in the context of their chronic comorbidities is felt to place them at high risk for further clinical deterioration. Furthermore, it is not anticipated that the patient will be medically stable for discharge from the hospital within 2 midnights of admission. The following factors support the admission status of inpatient.     The patient's presenting symptoms include painful right hip, cough The worrisome physical exam findings include minimally externally rotated right lower extremity, hypoxic down to 89 requiring 2 L nasal cannula The initial radiographic and laboratory data  are worrisome because of pneumonia and right hip fracture The chronic co-morbidities include hx of peptic ulcer disease, hypertension, hard of hearing, GERD, chronic chest wall pain       * I certify that at the point of admission it is my clinical judgment that the patient will require inpatient hospital care spanning beyond 2 midnights from the point of admission due to high intensity of service, high risk for further deterioration and high frequency of surveillance required.*  Time spent in minutes : [place]

## 2020-03-09 NOTE — ED Triage Notes (Signed)
Pt fell earlier today causing injury to right hip/leg. Pt called EMS due to pain increasing over the night.

## 2020-03-09 NOTE — H&P (View-Only) (Signed)
[place]   Patient ID: ARTHURO CANELO, male   DOB: 02/18/1930, 86 y.o.   MRN: [id number]  New patient  Requested by:  Reason for:   Based on the information below I recommend internal fixation right hip versus bipolar partial replacement depending on fracture reduction at the time of surgery*  Chief Complaint  Patient presents with  . Fall     HPI  I have spoken to [name] who indicates the patient is not a good ambulator at this time.  She is the caregiver and is the daughter-in-law.  86 year old male fell on March 09, 2020 landed on his right hip was unable to ambulate complained of severe nonradiating sharp right hip pain which was worse by movement  Review of Systems (all) ROS All systems were negative except for patient was experienced some shortness of breath has been to the urologist for prostate issues and he also had some recent back pain   Past Medical History:  Diagnosis Date  . Anxiety   . Cancer [facility])    prostate  . Chronic chest wall pain   . Essential hypertension, benign 06/20/2016  . GERD (gastroesophageal reflux disease)   . H/O echocardiogram 2016   normal  . Heart murmur    "leaking heart valve"  . High cholesterol 06/20/2016  . HOH (hard of hearing)    left  . Hypertension   . Poor historian   . PUD (peptic ulcer disease)   . Shortness of breath     Past Surgical History:  Procedure Laterality Date  . ESOPHAGOGASTRODUODENOSCOPY    . ESOPHAGOGASTRODUODENOSCOPY N/A 07/18/2016   Procedure: ESOPHAGOGASTRODUODENOSCOPY (EGD);  Surgeon: Rogene Houston, MD;  Location: [facility] ENDO SUITE;  Service: Endoscopy;  Laterality: N/A;  3:00-moved to 230 per Lelon Frohlich  . ORIF WRIST FRACTURE  12/19/2011   Procedure: OPEN REDUCTION INTERNAL FIXATION (ORIF) WRIST FRACTURE;  Surgeon: Carole Civil, MD;  Location: [facility] ORS;  Service: Orthopedics;  Laterality: Right;  . prostate cancer     Diagnosed in the 90s  . TONSILLECTOMY       Family History  Problem Relation Age of Onset  . Cancer Other   . Heart attack Mother   . Cancer Brother   . Heart attack Brother   . Heart disease Sister        by pass   Social History   Tobacco Use  . Smoking status: Never Smoker  . Smokeless tobacco: Never Used  Substance Use Topics  . Alcohol use: No  . Drug use: No   No Known Allergies  Current Facility-Administered Medications:  .  0.9 %  sodium chloride infusion, , Intravenous, Continuous, Zierle-Ghosh, Asia B, DO, Last Rate: 75 mL/hr at 03/09/20 0624, New Bag at 03/09/20 0624 .  aspirin EC tablet 81 mg, 81 mg, Oral, BH-q7a, Zierle-Ghosh, Asia B, DO, 81 mg at 03/09/20 0743 .  azithromycin (ZITHROMAX) 500 mg in sodium chloride 0.9 % 250 mL IVPB, 500 mg, Intravenous, Q24H, Zierle-Ghosh, Asia B, DO .  calcium carbonate (TUMS - dosed in mg elemental calcium) chewable tablet 200 mg of elemental calcium, 1 tablet, Oral, BID PRN, Zierle-Ghosh, Asia B, DO .  cefTRIAXone (ROCEPHIN) 2 g in sodium chloride 0.9 % 100 mL IVPB, 2 g, Intravenous, Q24H, Zierle-Ghosh, Asia B, DO, Stopped at 03/09/20 0715 .  diltiazem (CARDIZEM CD) 24 hr capsule 240 mg, 240 mg, Oral, Daily, Zierle-Ghosh, Asia B, DO .  heparin injection 5,000 Units, 5,000 Units, Subcutaneous,  Q8H, Zierle-Ghosh, Asia B, DO, 5,000 Units at 03/09/20 0618 .  hydrochlorothiazide (HYDRODIURIL) tablet 25 mg, 25 mg, Oral, Daily, Zierle-Ghosh, Asia B, DO .  insulin aspart (novoLOG) injection 0-6 Units, 0-6 Units, Subcutaneous, Q4H, Johnson, Clanford L, MD .  lisinopril (ZESTRIL) tablet 40 mg, 40 mg, Oral, Daily, Zierle-Ghosh, Asia B, DO .  LORazepam (ATIVAN) tablet 1 mg, 1 mg, Oral, BID PRN, Zierle-Ghosh, Asia B, DO .  morphine 4 MG/ML injection 4 mg, 4 mg, Intravenous, Q3H PRN, Zierle-Ghosh, Asia B, DO, 4 mg at 03/09/20 0617 .  pantoprazole (PROTONIX) EC tablet 40 mg, 40 mg, Oral, Daily, Zierle-Ghosh, Asia B, DO .  potassium chloride 10 mEq in 100 mL IVPB, 10 mEq, Intravenous, Q1  Hr x 3, Zierle-Ghosh, Asia B, DO, Last Rate: 100 mL/hr at 03/09/20 0802, 10 mEq at 03/09/20 0802 .  simvastatin (ZOCOR) tablet 10 mg, 10 mg, Oral, QHS, Zierle-Ghosh, Asia B, DO .  terazosin (HYTRIN) capsule 5 mg, 5 mg, Oral, BID, Zierle-Ghosh, Asia B, DO .  venlafaxine XR (EFFEXOR-XR) 24 hr capsule 75 mg, 75 mg, Oral, Daily, Zierle-Ghosh, Asia B, DO  Current Outpatient Medications:  .  alfuzosin (UROXATRAL) 10 MG 24 hr tablet, Take 10 mg by mouth daily with breakfast., Disp: , Rfl:  .  aspirin EC 81 MG tablet, Take 81 mg by mouth every morning., Disp: , Rfl:  .  calcium carbonate (TUMS - DOSED IN MG ELEMENTAL CALCIUM) 500 MG chewable tablet, Chew 1 tablet by mouth 2 (two) times daily as needed for indigestion or heartburn. , Disp: , Rfl:  .  cholecalciferol (VITAMIN D) 1000 units tablet, Take 1,000 Units by mouth daily., Disp: , Rfl:  .  diltiazem (CARDIZEM CD) 240 MG 24 hr capsule, Take 240 mg by mouth daily. , Disp: , Rfl:  .  diphenhydramine-acetaminophen (TYLENOL PM) 25-500 MG TABS tablet, Take 1 tablet by mouth at bedtime. , Disp: , Rfl:  .  Flax Oil-Fish Oil-Borage Oil (FISH OIL-FLAX OIL-BORAGE OIL) CAPS, Take 1 capsule by mouth daily., Disp: , Rfl:  .  hydrochlorothiazide (HYDRODIURIL) 25 MG tablet, Take 25 mg by mouth daily., Disp: , Rfl:  .  HYDROcodone-acetaminophen (NORCO/VICODIN) 5-325 MG tablet, Take 1 tablet by mouth every 6 (six) hours as needed., Disp: 6 tablet, Rfl: 0 .  lidocaine (XYLOCAINE) 5 % ointment, , Disp: , Rfl:  .  lisinopril (PRINIVIL,ZESTRIL) 40 MG tablet, Take 40 mg by mouth 2 (two) times daily., Disp: , Rfl:  .  LORazepam (ATIVAN) 1 MG tablet, Take 1 mg by mouth 2 (two) times daily as needed for anxiety. , Disp: , Rfl:  .  meclizine (ANTIVERT) 25 MG tablet, Take 25 mg by mouth as needed. , Disp: , Rfl:  .  Melatonin 10 MG TABS, Take 10 mg by mouth at bedtime., Disp: , Rfl:  .  omeprazole (PRILOSEC) 20 MG capsule, Take 1 capsule (20 mg total) by mouth daily before  breakfast. (Patient taking differently: Take 40 mg by mouth daily before breakfast. ), Disp: 30 capsule, Rfl: 5 .  oxybutynin (DITROPAN) 5 MG tablet, Take 1 tablet (5 mg total) by mouth every 8 (eight) hours as needed (urinary frequency/urgency)., Disp: 270 tablet, Rfl: 3 .  potassium chloride SA (K-DUR,KLOR-CON) 20 MEQ tablet, Take 20 mEq by mouth 2 (two) times daily., Disp: , Rfl:  .  simvastatin (ZOCOR) 10 MG tablet, Take 1 tablet (10 mg total) by mouth at bedtime., Disp: , Rfl:  .  terazosin (HYTRIN) 5 MG capsule, Take  5 mg by mouth 2 (two) times daily., Disp: , Rfl:  .  triamcinolone cream (KENALOG) 0.1 %, APPLY TO AFFECTED AREASWTHREE TIMES DAILY AS NEEDED FOR RASH., Disp: , Rfl:  .  venlafaxine XR (EFFEXOR-XR) 75 MG 24 hr capsule, Take 75 mg by mouth daily., Disp: , Rfl:  .  vitamin C (ASCORBIC ACID) 500 MG tablet, Take 1,000 mg by mouth daily., Disp: , Rfl:     Physical Exam(=30) BP (!) 185/72 (BP Location: Right Arm)   Pulse (!) 54   Temp 97.9 F (36.6 C) (Oral)   Resp 16   Ht 5\' 9"  (1.753 m)   Wt 90 kg   SpO2 98%   BMI 29.30 kg/m   Gen. Appearance normal with a protuberant abdomen mildly overweight Peripheral vascular system mild edema Lymph nodes ARE NORMAL  Gait unable to ambulate  Left Upper extremity  Inspection revealed no malalignment or asymmetry  Assessment of range of motion: Full range of motion was recorded  Assessment of stability: Elbow wrist and hand and shoulder were stable  Assessment of muscle strength and tone revealed grade 5 muscle strength and normal muscle tone  Skin was normal without rash lesion or ulceration  Right upper extremity  Inspection revealed no malalignment or asymmetry  Assessment of range of motion: Full range of motion was recorded  Assessment of stability: Elbow wrist and hand and shoulder were stable  Assessment of muscle strength and tone revealed grade 5 muscle strength and normal muscle tone  Skin was normal without rash  lesion or ulceration  Left lower extremity  Inspection revealed no malalignment or asymmetry  Assessment of range of motion: Full range of motion was recorded  Assessment of stability: Ankle, knee and hip were stable  Assessment of muscle strength and tone revealed grade 5 muscle strength and normal muscle tone  Skin was normal without rash lesion or ulceration  Right lower extremity Inspection revealed slight shortening of the right lower extremity  Assessment of range of motion revealed he can move his foot and his toes and his ankle  Assessment of range of motion: Full range of motion was recorded  Assessment of stability: Ankle, knee and hip were stable  Assessment of muscle strength and tone normal strength and muscle tone  Skin was normal without rash lesion or ulceration  Coordination was tested by finger-to-nose nose and was normal with slight intention tremor Deep tendon reflexes were normal in the upper extremities deferred in the lower extremities toes were downgoing on Babinski sensation to touch was normal   Mental status  Oriented to time person and place normal  Mood and affect normal without depression anxiety or agitation  Dx:   Data Reviewed  ER RECORD REVIEWED: CONFIRMS HISTORY   I reviewed the following images and the reports and my independent interpretation is AP pelvis AP lateral right hip  The fracture is complete it is partially angulated apex anterior   EXAM: PORTABLE CHEST 1 VIEW  COMPARISON:  06/06/2018  FINDINGS: Heart size and pulmonary vascularity are normal. Peribronchial thickening with streaky perihilar infiltrates. This could represent bronchitis or multifocal pneumonia. No focal consolidation. No pleural effusions. No pneumothorax. Mediastinal contours appear intact. Calcified and tortuous aorta. Degenerative changes in the spine and shoulders.  IMPRESSION: Peribronchial thickening with streaky perihilar  infiltrates suggesting bronchitis or multifocal pneumonia.   Electronically Signed   By: Lucienne Capers M.D.   On: 03/09/2020 02:47  Assessment  86 year old male who is a poor household  ambulator fractured his right hip has a complete fracture with angulation  Plan   Closed reduction on the fracture table Assess fracture reduction if acceptable cannulated hip screws right hip or dynamic hip screw right hip if not  Convert to bipolar hip replacement right hip   Carole Civil MD

## 2020-03-09 NOTE — ED Provider Notes (Signed)
[facility] EMERGENCY DEPARTMENT Provider Note   CSN: [id number] Arrival date & time: 03/09/20  0201     History Chief Complaint  Patient presents with  . Fall    Jeffrey Sherman is a 86 y.o. male.  The history is provided by the patient.  Fall This is a new problem. Pertinent negatives include no chest pain, no abdominal pain, no headaches and no shortness of breath. The symptoms are aggravated by walking. The symptoms are relieved by rest.  Patient presents after mechanical fall.  He reports he fell several hours ago at home.  Reports he tripped over the threshold in his house.  He reports he landed on his right hip and buttocks.  No head injury or LOC.  He does not take anticoagulants. No neck or back pain.  No chest pain/shortness of breath Patient now reports pain in right hip and right buttock    Past Medical History:  Diagnosis Date  . Anxiety   . Cancer [facility])    prostate  . Chronic chest wall pain   . Essential hypertension, benign 06/20/2016  . GERD (gastroesophageal reflux disease)   . H/O echocardiogram 2016   normal  . Heart murmur    "leaking heart valve"  . High cholesterol 06/20/2016  . HOH (hard of hearing)    left  . Hypertension   . Poor historian   . PUD (peptic ulcer disease)   . Shortness of breath     Patient Active Problem List   Diagnosis Date Noted  . Infectious colitis   . Anemia   . Leukocytosis   . Rectal bleeding 11/06/2017  . Abdominal pain 11/06/2017  . Hypokalemia 11/06/2017  . Renal insufficiency 11/06/2017  . Hyperglycemia 11/06/2017  . Essential hypertension, benign 06/20/2016  . High cholesterol 06/20/2016  . Gastroesophageal reflux disease without esophagitis 06/20/2016  . Dyspnea 12/17/2013  . Constipation 12/16/2011  . Wrist fracture 12/16/2011    Past Surgical History:  Procedure Laterality Date  . ESOPHAGOGASTRODUODENOSCOPY    . ESOPHAGOGASTRODUODENOSCOPY N/A 07/18/2016   Procedure: ESOPHAGOGASTRODUODENOSCOPY  (EGD);  Surgeon: Rogene Houston, MD;  Location: [facility] ENDO SUITE;  Service: Endoscopy;  Laterality: N/A;  3:00-moved to 230 per Lelon Frohlich  . ORIF WRIST FRACTURE  12/19/2011   Procedure: OPEN REDUCTION INTERNAL FIXATION (ORIF) WRIST FRACTURE;  Surgeon: Carole Civil, MD;  Location: [facility] ORS;  Service: Orthopedics;  Laterality: Right;  . prostate cancer     Diagnosed in the 90s  . TONSILLECTOMY         Family History  Problem Relation Age of Onset  . Cancer Other   . Heart attack Mother   . Cancer Brother   . Heart attack Brother   . Heart disease Sister        by pass    Social History   Tobacco Use  . Smoking status: Never Smoker  . Smokeless tobacco: Never Used  Substance Use Topics  . Alcohol use: No  . Drug use: No    Home Medications Prior to Admission medications   Medication Sig Start Date End Date Taking? Authorizing Provider  alfuzosin (UROXATRAL) 10 MG 24 hr tablet Take 10 mg by mouth daily with breakfast.    [provider]  aspirin EC 81 MG tablet Take 81 mg by mouth every morning.    [provider]  calcium carbonate (TUMS - DOSED IN MG ELEMENTAL CALCIUM) 500 MG chewable tablet Chew 1 tablet by mouth 2 (two) times daily as  needed for indigestion or heartburn.     [provider]  cholecalciferol (VITAMIN D) 1000 units tablet Take 1,000 Units by mouth daily.    [provider]  diltiazem (CARDIZEM CD) 240 MG 24 hr capsule Take 240 mg by mouth daily.  11/28/17   [provider]  diphenhydramine-acetaminophen (TYLENOL PM) 25-500 MG TABS tablet Take 1 tablet by mouth at bedtime.     [provider]  Flax Oil-Fish Oil-Borage Oil (FISH OIL-FLAX OIL-BORAGE OIL) CAPS Take 1 capsule by mouth daily.    [provider]  hydrochlorothiazide (HYDRODIURIL) 25 MG tablet Take 25 mg by mouth daily.    [provider]  HYDROcodone-acetaminophen (NORCO/VICODIN) 5-325 MG tablet Take 1 tablet by mouth every 6 (six)  hours as needed. 12/28/19   Nuala Alpha A, PA-C  lidocaine (XYLOCAINE) 5 % ointment  09/28/19   [provider]  lisinopril (PRINIVIL,ZESTRIL) 40 MG tablet Take 40 mg by mouth 2 (two) times daily.    [provider]  LORazepam (ATIVAN) 1 MG tablet Take 1 mg by mouth 2 (two) times daily as needed for anxiety.     [provider]  meclizine (ANTIVERT) 25 MG tablet Take 25 mg by mouth as needed.  11/14/17   [provider]  Melatonin 10 MG TABS Take 10 mg by mouth at bedtime.    [provider]  omeprazole (PRILOSEC) 20 MG capsule Take 1 capsule (20 mg total) by mouth daily before breakfast. Patient taking differently: Take 40 mg by mouth daily before breakfast.  12/23/17   Rehman, Mechele Dawley, MD  oxybutynin (DITROPAN) 5 MG tablet Take 1 tablet (5 mg total) by mouth every 8 (eight) hours as needed (urinary frequency/urgency). 02/15/20   Franchot Gallo, MD  potassium chloride SA (K-DUR,KLOR-CON) 20 MEQ tablet Take 20 mEq by mouth 2 (two) times daily.    [provider]  simvastatin (ZOCOR) 10 MG tablet Take 1 tablet (10 mg total) by mouth at bedtime. 11/23/17   Barton Dubois, MD  terazosin (HYTRIN) 5 MG capsule Take 5 mg by mouth 2 (two) times daily. 09/14/17   [provider]  triamcinolone cream (KENALOG) 0.1 % APPLY TO AFFECTED AREASWTHREE TIMES DAILY AS NEEDED FOR RASH. 09/15/19   [provider]  venlafaxine XR (EFFEXOR-XR) 75 MG 24 hr capsule Take 75 mg by mouth daily.    [provider]  vitamin C (ASCORBIC ACID) 500 MG tablet Take 1,000 mg by mouth daily.    [provider]    Allergies    Patient has no known allergies.  Review of Systems   Review of Systems  Constitutional: Negative for fever.  Respiratory: Negative for shortness of breath.   Cardiovascular: Negative for chest pain.  Gastrointestinal: Negative for abdominal pain.  Musculoskeletal: Positive for arthralgias. Negative for back pain and  neck pain.  Neurological: Negative for headaches.  All other systems reviewed and are negative.   Physical Exam Updated Vital Signs BP 127/68   Pulse (!) 58   Temp 97.9 F (36.6 C) (Oral)   Resp 18   Ht 1.753 m (5\' 9" )   Wt 90 kg   SpO2 90%   BMI 29.30 kg/m   Physical Exam CONSTITUTIONAL: Elderly, uncomfortable appearing HEAD: Normocephalic/atraumatic EYES: EOMI/PERRL ENMT: Mucous membranes moist NECK: supple no meningeal signs SPINE/BACK:entire spine nontender CV: S1/S2 noted, no murmurs/rubs/gallops noted LUNGS: Lungs are clear to auscultation bilaterally, no apparent distress ABDOMEN: soft, nontender NEURO: Pt is awake/alert/appropriate, moves all extremitiesx4.  No facial droop.   EXTREMITIES: pulses normal/equal, full ROM,, right lower extremity appears shortened.  Tenderness to range of motion right hip.  Distal pulses intact.  All other extremities/joints palpated/ranged and nontender SKIN: warm, color normal PSYCH: no abnormalities of mood noted, alert and oriented to situation  ED Results / Procedures / Treatments   Labs (all labs ordered are listed, but only abnormal results are displayed) Labs Reviewed  BASIC METABOLIC PANEL - Abnormal; Notable for the following components:      Result Value   Potassium 3.2 (*)    Glucose, Bld 208 (*)    Creatinine, Ser 1.50 (*)    GFR calc non Af Amer 41 (*)    GFR calc Af Amer 47 (*)    All other components within normal limits  CBC WITH DIFFERENTIAL/PLATELET - Abnormal; Notable for the following components:   WBC 13.7 (*)    Hemoglobin 12.1 (*)    HCT 37.7 (*)    MCH 25.8 (*)    Neutro Abs 12.2 (*)    Lymphs Abs 0.5 (*)    All other components within normal limits  RESPIRATORY PANEL BY RT PCR (FLU A&B, COVID)  PROTIME-INR  TYPE AND SCREEN    EKG EKG Interpretation  Date/Time:  Thursday March 09 2020 02:50:13 EDT Ventricular Rate:  53 PR Interval:    QRS Duration: 106 QT Interval:  491 QTC  Calculation: 461 R Axis:   -30 Text Interpretation: Marked sinus bradycardia Left axis deviation Abnormal R-wave progression, late transition Borderline repolarization abnormality Otherwise no significant change Confirmed by Ripley Fraise [phone]) on 03/09/2020 3:31:24 AM   Radiology DG Chest Port 1 View  Result Date: 03/09/2020 CLINICAL DATA:  Right hip pain after a fall. History of prostate cancer. EXAM: PORTABLE CHEST 1 VIEW COMPARISON:  06/06/2018 FINDINGS: Heart size and pulmonary vascularity are normal. Peribronchial thickening with streaky perihilar infiltrates. This could represent bronchitis or multifocal pneumonia. No focal consolidation. No pleural effusions. No pneumothorax. Mediastinal contours appear intact. Calcified and tortuous aorta. Degenerative changes in the spine and shoulders. IMPRESSION: Peribronchial thickening with streaky perihilar infiltrates suggesting bronchitis or multifocal pneumonia. Electronically Signed   By: Lucienne Capers M.D.   On: 03/09/2020 02:47   DG Hip Unilat W or Wo Pelvis 2-3 Views Right  Result Date: 03/09/2020 CLINICAL DATA:  Right hip pain after fall EXAM: DG HIP (WITH OR WITHOUT PELVIS) 2-3V RIGHT COMPARISON:  None. FINDINGS: There is a minimally displaced fracture of the right femoral neck with mild shortening. No dislocation. No other pelvic fracture. Left hip normal. IMPRESSION: Minimally displaced right femoral neck fracture. Electronically Signed   By: Ulyses Jarred M.D.   On: 03/09/2020 02:45    Procedures Procedures (including critical care time)  Medications Ordered in ED Medications  fentaNYL (SUBLIMAZE) injection 50 mcg (50 mcg Intravenous Given 03/09/20 0234)  ondansetron (ZOFRAN) injection 4 mg (4 mg Intravenous Given 03/09/20 0234)    ED Course  I have reviewed the triage vital signs and the nursing notes.  Pertinent labs & imaging results that were available during my care of the patient were reviewed by me and considered in  my medical decision making (see chart for details).    MDM Rules/Calculators/A&P                          4:30 AM Patient presents after mechanical fall. No signs of head or neck injury.  Patient sustained a right femoral neck  fracture.  Discussed with Dr. Aline Brochure with orthopedic surgery.  Patient will be admitted to medical service with Ortho consult 4:41 AM Discussed with hospitalist service for admission.  Final Clinical Impression(s) / ED Diagnoses Final diagnoses:  Closed displaced fracture of right femoral neck [facility])    Rx / DC Orders ED Discharge Orders    None       Ripley Fraise, MD 03/09/20 [phone]

## 2020-03-09 NOTE — TOC Initial Note (Signed)
Transition of Care [facility]) - Initial/Assessment Note    Patient Details  Name: Jeffrey Sherman MRN: [id number] Date of Birth: 10/26/1929  [facility] of [facility]) CM/SW Contact:    Boneta Lucks, RN Phone Number: 03/09/2020, 2:54 PM  Clinical Narrative:      Patient admitted with Right Closed hip fracture. Surgery scheduled for tomorrow. TOC spoke with Silva Bandy - Daughter in [place]. Patient lives with his son and Silva Bandy.  TOC discussed SNF with Silva Bandy. She is requesting [facility].  FL2 is incomplete, but TOC sent to Keri at [facility] and called. Kristin Bruins is hopeful she will have a bed first of the week. TOC to follow.            Expected Discharge Plan: Skilled Nursing Facility Barriers to Discharge: Continued Medical Work up   Patient Goals and CMS Choice Patient states their goals for this hospitalization and ongoing recovery are:: to go to SNF then return home. CMS Medicare.gov Compare Post Acute Care list provided to:: Patient Represenative (must comment) Choice offered to / list presented to : Adult Children  Expected Discharge Plan and Services Expected Discharge Plan: [place] [facility] [place] arrangements for the past 2 months: Single Family Home                     Prior Living Arrangements/Services Living arrangements for the past 2 months: Single Family Home Lives with:: Adult Children Patient language and need for interpreter reviewed:: Yes Do you feel safe going back to the place where you live?: Yes      Need for Family Participation in Patient Care: Yes (Comment) Care giver support system in place?: Yes (comment)   Criminal Activity/Legal Involvement Pertinent to Current Situation/Hospitalization: No - Comment as needed   Permission Sought/Granted      Share Information with NAME: Silva Bandy     Permission granted to share info w Relationship: Daughter in [place] granted to share info w Contact Information: SNF  Emotional Assessment      Affect (typically observed): Accepting Orientation: : Oriented to Self, Oriented to Place, Oriented to  Time, Oriented to Situation Alcohol / Substance Use: Not Applicable Psych Involvement: No (comment)  Admission diagnosis:  Closed hip fracture, right, initial encounter ([place]) [S72.001A] Closed displaced fracture of right femoral neck ([place]) [S72.001A] Patient Active Problem List   Diagnosis Date Noted  . Closed displaced fracture of right femoral neck ([place]) 03/09/2020  . Infectious colitis   . Anemia   . Leukocytosis   . Rectal bleeding 11/06/2017  . Abdominal pain 11/06/2017  . Hypokalemia 11/06/2017  . Renal insufficiency 11/06/2017  . Hyperglycemia 11/06/2017  . Essential hypertension 06/20/2016  . High cholesterol 06/20/2016  . Gastroesophageal reflux disease without esophagitis 06/20/2016  . Dyspnea 12/17/2013  . Constipation 12/16/2011  . Wrist fracture 12/16/2011   PCP:  Celene Squibb, MD Pharmacy:   [facility], [place] [address] Phone: [phone] Fax: [phone]  [facility] - [address], [place] [address] Phone: [phone] Fax: [phone]  [facility] [place], [place] [address] [place] [address] Phone: [phone] Fax: [phone]

## 2020-03-09 NOTE — Progress Notes (Signed)
ASSUMPTION OF CARE NOTE   03/09/2020 2:05 PM  Jeffrey Sherman was seen and examined.  The H&P by the admitting provider, orders, imaging was reviewed.  Please see new orders.  Will continue to follow.   Ortho consult requested and IV BP lowering meds ordered and 2D echo ordered for preop evaluation after conferencing with anesthesiologist.   Vitals:   03/09/20 1000 03/09/20 1100  BP: (!) 189/88 (!) 192/84  Pulse: 67 66  Resp: (!) 21 18  Temp:  98.2 F (36.8 C)  SpO2: 92% 93%    Results for orders placed or performed during the hospital encounter of 03/09/20  Respiratory Panel by RT PCR (Flu A&B, Covid) - Nasopharyngeal Swab   Specimen: Nasopharyngeal Swab  Result Value Ref Range   SARS Coronavirus 2 by RT PCR NEGATIVE NEGATIVE   Influenza A by PCR NEGATIVE NEGATIVE   Influenza B by PCR NEGATIVE NEGATIVE  Surgical PCR screen   Specimen: Nasal Mucosa; Nasal Swab  Result Value Ref Range   MRSA, PCR NEGATIVE NEGATIVE   Staphylococcus aureus NEGATIVE NEGATIVE  Basic metabolic panel  Result Value Ref Range   Sodium 141 135 - 145 mmol/L   Potassium 3.2 (L) 3.5 - 5.1 mmol/L   Chloride 100 98 - 111 mmol/L   CO2 31 22 - 32 mmol/L   Glucose, Bld 208 (H) 70 - 99 mg/dL   BUN 21 8 - 23 mg/dL   Creatinine, Ser 1.50 (H) 0.61 - 1.24 mg/dL   Calcium 9.2 8.9 - 10.3 mg/dL   GFR calc non Af Amer 41 (L) >60 mL/min   GFR calc Af Amer 47 (L) >60 mL/min   Anion gap 10 5 - 15  CBC WITH DIFFERENTIAL  Result Value Ref Range   WBC 13.7 (H) 4.0 - 10.5 K/uL   RBC 4.69 4.22 - 5.81 MIL/uL   Hemoglobin 12.1 (L) 13.0 - 17.0 g/dL   HCT 37.7 (L) 39 - 52 %   MCV 80.4 80.0 - 100.0 fL   MCH 25.8 (L) 26.0 - 34.0 pg   MCHC 32.1 30.0 - 36.0 g/dL   RDW 14.6 11.5 - 15.5 %   Platelets 205 150 - 400 K/uL   nRBC 0.0 0.0 - 0.2 %   Neutrophils Relative % 88 %   Neutro Abs 12.2 (H) 1.7 - 7.7 K/uL   Lymphocytes Relative 4 %   Lymphs Abs 0.5 (L) 0.7 - 4.0 K/uL   Monocytes Relative 7 %   Monocytes Absolute 1.0 0  - 1 K/uL   Eosinophils Relative 0 %   Eosinophils Absolute 0.0 0 - 0 K/uL   Basophils Relative 0 %   Basophils Absolute 0.0 0 - 0 K/uL   Immature Granulocytes 1 %   Abs Immature Granulocytes 0.07 0.00 - 0.07 K/uL  Protime-INR  Result Value Ref Range   Prothrombin Time 13.5 11.4 - 15.2 seconds   INR 1.1 0.8 - 1.2  Hemoglobin A1c  Result Value Ref Range   Hgb A1c MFr Bld 5.9 (H) 4.8 - 5.6 %   Mean Plasma Glucose 122.63 mg/dL  HIV Antibody (routine testing w rflx)  Result Value Ref Range   HIV Screen 4th Generation wRfx Non Reactive Non Reactive  Urinalysis, Routine w reflex microscopic Urine, Clean Catch  Result Value Ref Range   Color, Urine YELLOW YELLOW   APPearance CLEAR CLEAR   Specific Gravity, Urine 1.019 1.005 - 1.030   pH 6.0 5.0 - 8.0   Glucose, UA NEGATIVE  NEGATIVE mg/dL   Hgb urine dipstick NEGATIVE NEGATIVE   Bilirubin Urine NEGATIVE NEGATIVE   Ketones, ur NEGATIVE NEGATIVE mg/dL   Protein, ur 30 (A) NEGATIVE mg/dL   Nitrite NEGATIVE NEGATIVE   Leukocytes,Ua NEGATIVE NEGATIVE   RBC / HPF 0-5 0 - 5 RBC/hpf   WBC, UA 0-5 0 - 5 WBC/hpf   Bacteria, UA NONE SEEN NONE SEEN   Mucus PRESENT    Hyaline Casts, UA PRESENT   Glucose, capillary  Result Value Ref Range   Glucose-Capillary 150 (H) 70 - 99 mg/dL  CBG monitoring, ED  Result Value Ref Range   Glucose-Capillary 156 (H) 70 - 99 mg/dL   Comment 1 Notify RN   Type and screen [facility]  Result Value Ref Range   ABO/RH(D) O POS    Antibody Screen NEG    Sample Expiration      03/12/2020,2359 Performed at [facility], [address] [place], [place] [address]    Time Spent 45 mins  Murvin Natal, MD [facility]   03/09/2020  2:03 AM How to contact the [facility] Attending or Consulting provider [place] or covering provider during after hours 7P -7A, for this patient?  1. Check the care team in [facility] and look for a) attending/consulting TRH provider listed and b) the [facility] team listed 2. Log into  [email or web address] and use [place]'s universal password to access. If you do not have the password, please contact the hospital operator. 3. Locate the [facility] provider you are looking for under [facility] and page to a number that you can be directly reached. 4. If you still have difficulty reaching the provider, please page the [facility] (Director on Call) for the Hospitalists listed on amion for assistance.

## 2020-03-09 NOTE — Consult Note (Signed)
[place]   Patient ID: Jeffrey Sherman, male   DOB: May 17, 1930, 86 y.o.   MRN: [id number]  New patient  Requested by:  Reason for:   Based on the information below I recommend internal fixation right hip versus bipolar partial replacement depending on fracture reduction at the time of surgery*  Chief Complaint  Patient presents with  . Fall     HPI  I have spoken to Jeffrey Sherman who indicates the patient is not a good ambulator at this time.  She is the caregiver and is the daughter-in-law.  86 year old male fell on March 09, 2020 landed on his right hip was unable to ambulate complained of severe nonradiating sharp right hip pain which was worse by movement  Review of Systems (all) ROS All systems were negative except for patient was experienced some shortness of breath has been to the urologist for prostate issues and he also had some recent back pain   Past Medical History:  Diagnosis Date  . Anxiety   . Cancer [facility])    prostate  . Chronic chest wall pain   . Essential hypertension, benign 06/20/2016  . GERD (gastroesophageal reflux disease)   . H/O echocardiogram 2016   normal  . Heart murmur    "leaking heart valve"  . High cholesterol 06/20/2016  . HOH (hard of hearing)    left  . Hypertension   . Poor historian   . PUD (peptic ulcer disease)   . Shortness of breath     Past Surgical History:  Procedure Laterality Date  . ESOPHAGOGASTRODUODENOSCOPY    . ESOPHAGOGASTRODUODENOSCOPY N/A 07/18/2016   Procedure: ESOPHAGOGASTRODUODENOSCOPY (EGD);  Surgeon: Rogene Houston, MD;  Location: [facility] ENDO SUITE;  Service: Endoscopy;  Laterality: N/A;  3:00-moved to 230 per Lelon Frohlich  . ORIF WRIST FRACTURE  12/19/2011   Procedure: OPEN REDUCTION INTERNAL FIXATION (ORIF) WRIST FRACTURE;  Surgeon: Carole Civil, MD;  Location: [facility] ORS;  Service: Orthopedics;  Laterality: Right;  . prostate cancer     Diagnosed in the 90s  . TONSILLECTOMY       Family History  Problem Relation Age of Onset  . Cancer Other   . Heart attack Mother   . Cancer Brother   . Heart attack Brother   . Heart disease Sister        by pass   Social History   Tobacco Use  . Smoking status: Never Smoker  . Smokeless tobacco: Never Used  Substance Use Topics  . Alcohol use: No  . Drug use: No   No Known Allergies  Current Facility-Administered Medications:  .  0.9 %  sodium chloride infusion, , Intravenous, Continuous, Zierle-Ghosh, Asia B, DO, Last Rate: 75 mL/hr at 03/09/20 0624, New Bag at 03/09/20 0624 .  aspirin EC tablet 81 mg, 81 mg, Oral, BH-q7a, Zierle-Ghosh, Asia B, DO, 81 mg at 03/09/20 0743 .  azithromycin (ZITHROMAX) 500 mg in sodium chloride 0.9 % 250 mL IVPB, 500 mg, Intravenous, Q24H, Zierle-Ghosh, Asia B, DO .  calcium carbonate (TUMS - dosed in mg elemental calcium) chewable tablet 200 mg of elemental calcium, 1 tablet, Oral, BID PRN, Zierle-Ghosh, Asia B, DO .  cefTRIAXone (ROCEPHIN) 2 g in sodium chloride 0.9 % 100 mL IVPB, 2 g, Intravenous, Q24H, Zierle-Ghosh, Asia B, DO, Stopped at 03/09/20 0715 .  diltiazem (CARDIZEM CD) 24 hr capsule 240 mg, 240 mg, Oral, Daily, Zierle-Ghosh, Asia B, DO .  heparin injection 5,000 Units, 5,000 Units, Subcutaneous,  Q8H, Zierle-Ghosh, Asia B, DO, 5,000 Units at 03/09/20 0618 .  hydrochlorothiazide (HYDRODIURIL) tablet 25 mg, 25 mg, Oral, Daily, Zierle-Ghosh, Asia B, DO .  insulin aspart (novoLOG) injection 0-6 Units, 0-6 Units, Subcutaneous, Q4H, Johnson, Clanford L, MD .  lisinopril (ZESTRIL) tablet 40 mg, 40 mg, Oral, Daily, Zierle-Ghosh, Asia B, DO .  LORazepam (ATIVAN) tablet 1 mg, 1 mg, Oral, BID PRN, Zierle-Ghosh, Asia B, DO .  morphine 4 MG/ML injection 4 mg, 4 mg, Intravenous, Q3H PRN, Zierle-Ghosh, Asia B, DO, 4 mg at 03/09/20 0617 .  pantoprazole (PROTONIX) EC tablet 40 mg, 40 mg, Oral, Daily, Zierle-Ghosh, Asia B, DO .  potassium chloride 10 mEq in 100 mL IVPB, 10 mEq, Intravenous, Q1  Hr x 3, Zierle-Ghosh, Asia B, DO, Last Rate: 100 mL/hr at 03/09/20 0802, 10 mEq at 03/09/20 0802 .  simvastatin (ZOCOR) tablet 10 mg, 10 mg, Oral, QHS, Zierle-Ghosh, Asia B, DO .  terazosin (HYTRIN) capsule 5 mg, 5 mg, Oral, BID, Zierle-Ghosh, Asia B, DO .  venlafaxine XR (EFFEXOR-XR) 24 hr capsule 75 mg, 75 mg, Oral, Daily, Zierle-Ghosh, Asia B, DO  Current Outpatient Medications:  .  alfuzosin (UROXATRAL) 10 MG 24 hr tablet, Take 10 mg by mouth daily with breakfast., Disp: , Rfl:  .  aspirin EC 81 MG tablet, Take 81 mg by mouth every morning., Disp: , Rfl:  .  calcium carbonate (TUMS - DOSED IN MG ELEMENTAL CALCIUM) 500 MG chewable tablet, Chew 1 tablet by mouth 2 (two) times daily as needed for indigestion or heartburn. , Disp: , Rfl:  .  cholecalciferol (VITAMIN D) 1000 units tablet, Take 1,000 Units by mouth daily., Disp: , Rfl:  .  diltiazem (CARDIZEM CD) 240 MG 24 hr capsule, Take 240 mg by mouth daily. , Disp: , Rfl:  .  diphenhydramine-acetaminophen (TYLENOL PM) 25-500 MG TABS tablet, Take 1 tablet by mouth at bedtime. , Disp: , Rfl:  .  Flax Oil-Fish Oil-Borage Oil (FISH OIL-FLAX OIL-BORAGE OIL) CAPS, Take 1 capsule by mouth daily., Disp: , Rfl:  .  hydrochlorothiazide (HYDRODIURIL) 25 MG tablet, Take 25 mg by mouth daily., Disp: , Rfl:  .  HYDROcodone-acetaminophen (NORCO/VICODIN) 5-325 MG tablet, Take 1 tablet by mouth every 6 (six) hours as needed., Disp: 6 tablet, Rfl: 0 .  lidocaine (XYLOCAINE) 5 % ointment, , Disp: , Rfl:  .  lisinopril (PRINIVIL,ZESTRIL) 40 MG tablet, Take 40 mg by mouth 2 (two) times daily., Disp: , Rfl:  .  LORazepam (ATIVAN) 1 MG tablet, Take 1 mg by mouth 2 (two) times daily as needed for anxiety. , Disp: , Rfl:  .  meclizine (ANTIVERT) 25 MG tablet, Take 25 mg by mouth as needed. , Disp: , Rfl:  .  Melatonin 10 MG TABS, Take 10 mg by mouth at bedtime., Disp: , Rfl:  .  omeprazole (PRILOSEC) 20 MG capsule, Take 1 capsule (20 mg total) by mouth daily before  breakfast. (Patient taking differently: Take 40 mg by mouth daily before breakfast. ), Disp: 30 capsule, Rfl: 5 .  oxybutynin (DITROPAN) 5 MG tablet, Take 1 tablet (5 mg total) by mouth every 8 (eight) hours as needed (urinary frequency/urgency)., Disp: 270 tablet, Rfl: 3 .  potassium chloride SA (K-DUR,KLOR-CON) 20 MEQ tablet, Take 20 mEq by mouth 2 (two) times daily., Disp: , Rfl:  .  simvastatin (ZOCOR) 10 MG tablet, Take 1 tablet (10 mg total) by mouth at bedtime., Disp: , Rfl:  .  terazosin (HYTRIN) 5 MG capsule, Take  5 mg by mouth 2 (two) times daily., Disp: , Rfl:  .  triamcinolone cream (KENALOG) 0.1 %, APPLY TO AFFECTED AREASWTHREE TIMES DAILY AS NEEDED FOR RASH., Disp: , Rfl:  .  venlafaxine XR (EFFEXOR-XR) 75 MG 24 hr capsule, Take 75 mg by mouth daily., Disp: , Rfl:  .  vitamin C (ASCORBIC ACID) 500 MG tablet, Take 1,000 mg by mouth daily., Disp: , Rfl:     Physical Exam(=30) BP (!) 185/72 (BP Location: Right Arm)   Pulse (!) 54   Temp 97.9 F (36.6 C) (Oral)   Resp 16   Ht 5\' 9"  (1.753 m)   Wt 90 kg   SpO2 98%   BMI 29.30 kg/m   Gen. Appearance normal with a protuberant abdomen mildly overweight Peripheral vascular system mild edema Lymph nodes ARE NORMAL  Gait unable to ambulate  Left Upper extremity  Inspection revealed no malalignment or asymmetry  Assessment of range of motion: Full range of motion was recorded  Assessment of stability: Elbow wrist and hand and shoulder were stable  Assessment of muscle strength and tone revealed grade 5 muscle strength and normal muscle tone  Skin was normal without rash lesion or ulceration  Right upper extremity  Inspection revealed no malalignment or asymmetry  Assessment of range of motion: Full range of motion was recorded  Assessment of stability: Elbow wrist and hand and shoulder were stable  Assessment of muscle strength and tone revealed grade 5 muscle strength and normal muscle tone  Skin was normal without rash  lesion or ulceration  Left lower extremity  Inspection revealed no malalignment or asymmetry  Assessment of range of motion: Full range of motion was recorded  Assessment of stability: Ankle, knee and hip were stable  Assessment of muscle strength and tone revealed grade 5 muscle strength and normal muscle tone  Skin was normal without rash lesion or ulceration  Right lower extremity Inspection revealed slight shortening of the right lower extremity  Assessment of range of motion revealed he can move his foot and his toes and his ankle  Assessment of range of motion: Full range of motion was recorded  Assessment of stability: Ankle, knee and hip were stable  Assessment of muscle strength and tone normal strength and muscle tone  Skin was normal without rash lesion or ulceration  Coordination was tested by finger-to-nose nose and was normal with slight intention tremor Deep tendon reflexes were normal in the upper extremities deferred in the lower extremities toes were downgoing on Babinski sensation to touch was normal   Mental status  Oriented to time person and place normal  Mood and affect normal without depression anxiety or agitation  Dx:   Data Reviewed  ER RECORD REVIEWED: CONFIRMS HISTORY   I reviewed the following images and the reports and my independent interpretation is AP pelvis AP lateral right hip  The fracture is complete it is partially angulated apex anterior   EXAM: PORTABLE CHEST 1 VIEW  COMPARISON:  06/06/2018  FINDINGS: Heart size and pulmonary vascularity are normal. Peribronchial thickening with streaky perihilar infiltrates. This could represent bronchitis or multifocal pneumonia. No focal consolidation. No pleural effusions. No pneumothorax. Mediastinal contours appear intact. Calcified and tortuous aorta. Degenerative changes in the spine and shoulders.  IMPRESSION: Peribronchial thickening with streaky perihilar  infiltrates suggesting bronchitis or multifocal pneumonia.   Electronically Signed   By: Lucienne Capers M.D.   On: 03/09/2020 02:47  Assessment  86 year old male who is a poor household  ambulator fractured his right hip has a complete fracture with angulation  Plan   Closed reduction on the fracture table Assess fracture reduction if acceptable cannulated hip screws right hip or dynamic hip screw right hip if not  Convert to bipolar hip replacement right hip   Carole Civil MD

## 2020-03-10 ENCOUNTER — Inpatient Hospital Stay (HOSPITAL_COMMUNITY): Payer: Medicare Other | Admitting: Anesthesiology

## 2020-03-10 ENCOUNTER — Other Ambulatory Visit (HOSPITAL_COMMUNITY): Payer: Medicare Other

## 2020-03-10 ENCOUNTER — Encounter (HOSPITAL_COMMUNITY)
Admission: EM | Disposition: A | Discharge status: Skilled Nursing Facility | Payer: Self-pay | Source: Home / Self Care | Attending: Family Medicine

## 2020-03-10 ENCOUNTER — Encounter (HOSPITAL_COMMUNITY): Payer: Self-pay | Admitting: Family Medicine

## 2020-03-10 ENCOUNTER — Inpatient Hospital Stay (HOSPITAL_COMMUNITY): Payer: Medicare Other

## 2020-03-10 DIAGNOSIS — E7800 Pure hypercholesterolemia, unspecified: Secondary | ICD-10-CM | POA: Diagnosis not present

## 2020-03-10 DIAGNOSIS — K219 Gastro-esophageal reflux disease without esophagitis: Secondary | ICD-10-CM

## 2020-03-10 DIAGNOSIS — I10 Essential (primary) hypertension: Secondary | ICD-10-CM | POA: Diagnosis not present

## 2020-03-10 HISTORY — PX: HIP PINNING,CANNULATED: SHX1758

## 2020-03-10 LAB — CBC WITH DIFFERENTIAL/PLATELET
Abs Immature Granulocytes: 0.04 10*3/uL (ref 0.00–0.07)
Basophils Absolute: 0 10*3/uL (ref 0.0–0.1)
Basophils Relative: 0 %
Eosinophils Absolute: 0.1 10*3/uL (ref 0.0–0.5)
Eosinophils Relative: 1 %
HCT: 35.9 % — ABNORMAL LOW (ref 39.0–52.0)
Hemoglobin: 11.3 g/dL — ABNORMAL LOW (ref 13.0–17.0)
Immature Granulocytes: 0 %
Lymphocytes Relative: 9 %
Lymphs Abs: 1 10*3/uL (ref 0.7–4.0)
MCH: 26 pg (ref 26.0–34.0)
MCHC: 31.5 g/dL (ref 30.0–36.0)
MCV: 82.7 fL (ref 80.0–100.0)
Monocytes Absolute: 1.1 10*3/uL — ABNORMAL HIGH (ref 0.1–1.0)
Monocytes Relative: 10 %
Neutro Abs: 9 10*3/uL — ABNORMAL HIGH (ref 1.7–7.7)
Neutrophils Relative %: 80 %
Platelets: 188 10*3/uL (ref 150–400)
RBC: 4.34 MIL/uL (ref 4.22–5.81)
RDW: 15.1 % (ref 11.5–15.5)
WBC: 11.2 10*3/uL — ABNORMAL HIGH (ref 4.0–10.5)
nRBC: 0 % (ref 0.0–0.2)

## 2020-03-10 LAB — COMPREHENSIVE METABOLIC PANEL
ALT: 16 U/L (ref 0–44)
AST: 17 U/L (ref 15–41)
Albumin: 3 g/dL — ABNORMAL LOW (ref 3.5–5.0)
Alkaline Phosphatase: 46 U/L (ref 38–126)
Anion gap: 8 (ref 5–15)
BUN: 18 mg/dL (ref 8–23)
CO2: 30 mmol/L (ref 22–32)
Calcium: 8.8 mg/dL — ABNORMAL LOW (ref 8.9–10.3)
Chloride: 101 mmol/L (ref 98–111)
Creatinine, Ser: 1.21 mg/dL (ref 0.61–1.24)
GFR calc Af Amer: >60 mL/min (ref >60)
GFR calc non Af Amer: 53 mL/min — ABNORMAL LOW (ref >60)
Glucose, Bld: 160 mg/dL — ABNORMAL HIGH (ref 70–99)
Potassium: 3.3 mmol/L — ABNORMAL LOW (ref 3.5–5.1)
Sodium: 139 mmol/L (ref 135–145)
Total Bilirubin: 0.7 mg/dL (ref 0.3–1.2)
Total Protein: 5.6 g/dL — ABNORMAL LOW (ref 6.5–8.1)

## 2020-03-10 LAB — MAGNESIUM: Magnesium: 2.3 mg/dL (ref 1.7–2.4)

## 2020-03-10 LAB — GLUCOSE, CAPILLARY
Glucose-Capillary: 162 mg/dL — ABNORMAL HIGH (ref 70–99)
Glucose-Capillary: 141 mg/dL — ABNORMAL HIGH (ref 70–99)
Glucose-Capillary: 165 mg/dL — ABNORMAL HIGH (ref 70–99)
Glucose-Capillary: 141 mg/dL — ABNORMAL HIGH (ref 70–99)

## 2020-03-10 SURGERY — FIXATION, FEMUR, NECK, PERCUTANEOUS, USING SCREW
Anesthesia: General | Site: Hip | Laterality: Right

## 2020-03-10 MED ORDER — MENTHOL 3 MG MT LOZG: 1.0000 | LOZENGE | OROMUCOSAL | Status: DC | PRN

## 2020-03-10 MED ORDER — METHOCARBAMOL 1000 MG/10ML IJ SOLN
500.0000 mg | Freq: Four times a day (QID) | INTRAVENOUS | Status: DC | PRN
  Filled 2020-03-10: qty 5

## 2020-03-10 MED ORDER — BUPIVACAINE HCL (PF) 0.5 % IJ SOLN
INTRAMUSCULAR | Status: AC
  Filled 2020-03-10: qty 30

## 2020-03-10 MED ORDER — PHENOL 1.4 % MT LIQD: 1.0000 | OROMUCOSAL | Status: DC | PRN

## 2020-03-10 MED ORDER — CHLORHEXIDINE GLUCONATE CLOTH 2 % EX PADS
6.0000 | MEDICATED_PAD | Freq: Every day | CUTANEOUS | Status: DC
  Administered 2020-03-11: 6 via TOPICAL

## 2020-03-10 MED ORDER — SENNOSIDES-DOCUSATE SODIUM 8.6-50 MG PO TABS: 1.0000 | ORAL_TABLET | Freq: Every evening | ORAL | Status: DC | PRN

## 2020-03-10 MED ORDER — LACTATED RINGERS IV SOLN: INTRAVENOUS | Status: DC

## 2020-03-10 MED ORDER — DOCUSATE SODIUM 100 MG PO CAPS
100.0000 mg | ORAL_CAPSULE | Freq: Two times a day (BID) | ORAL | Status: DC
  Administered 2020-03-10 – 2020-03-14 (×10): 100 mg via ORAL
  Filled 2020-03-10 (×9): qty 1

## 2020-03-10 MED ORDER — SODIUM CHLORIDE 0.9 % IV SOLN: INTRAVENOUS | Status: DC

## 2020-03-10 MED ORDER — OXYBUTYNIN CHLORIDE 5 MG PO TABS: 5.0000 mg | ORAL_TABLET | Freq: Three times a day (TID) | ORAL | Status: DC | PRN

## 2020-03-10 MED ORDER — BUPIVACAINE HCL (PF) 0.5 % IJ SOLN
INTRAMUSCULAR | Status: DC | PRN
  Administered 2020-03-10: 30 mL

## 2020-03-10 MED ORDER — HYDROMORPHONE HCL 1 MG/ML IJ SOLN: 0.2500 mg | INTRAMUSCULAR | Status: DC | PRN

## 2020-03-10 MED ORDER — PHENYLEPHRINE 40 MCG/ML (10ML) SYRINGE FOR IV PUSH (FOR BLOOD PRESSURE SUPPORT)
PREFILLED_SYRINGE | INTRAVENOUS | Status: DC | PRN
  Administered 2020-03-10 (×2): 80 ug via INTRAVENOUS

## 2020-03-10 MED ORDER — ONDANSETRON HCL 4 MG/2ML IJ SOLN
INTRAMUSCULAR | Status: AC
  Filled 2020-03-10: qty 2

## 2020-03-10 MED ORDER — PHENYLEPHRINE 40 MCG/ML (10ML) SYRINGE FOR IV PUSH (FOR BLOOD PRESSURE SUPPORT)
PREFILLED_SYRINGE | INTRAVENOUS | Status: AC
  Filled 2020-03-10: qty 10

## 2020-03-10 MED ORDER — TRAMADOL HCL 50 MG PO TABS
50.0000 mg | ORAL_TABLET | Freq: Four times a day (QID) | ORAL | Status: DC
  Administered 2020-03-10 – 2020-03-12 (×5): 50 mg via ORAL
  Filled 2020-03-10 (×5): qty 1

## 2020-03-10 MED ORDER — FENTANYL CITRATE (PF) 100 MCG/2ML IJ SOLN
INTRAMUSCULAR | Status: DC | PRN
Start: 2020-03-10 — End: 2020-03-10
  Administered 2020-03-10: 50 ug via INTRAVENOUS

## 2020-03-10 MED ORDER — PROPOFOL 500 MG/50ML IV EMUL
INTRAVENOUS | Status: DC | PRN
  Administered 2020-03-10: 50 ug/kg/min via INTRAVENOUS
  Administered 2020-03-10: 60 ug/kg/min via INTRAVENOUS

## 2020-03-10 MED ORDER — BUPIVACAINE LIPOSOME 1.3 % IJ SUSP
INTRAMUSCULAR | Status: AC
  Filled 2020-03-10: qty 20

## 2020-03-10 MED ORDER — LACTATED RINGERS IV SOLN: INTRAVENOUS | Status: DC | PRN

## 2020-03-10 MED ORDER — BENZONATATE 100 MG PO CAPS: 100.0000 mg | ORAL_CAPSULE | Freq: Three times a day (TID) | ORAL | Status: DC | PRN

## 2020-03-10 MED ORDER — FENTANYL CITRATE (PF) 100 MCG/2ML IJ SOLN
INTRAMUSCULAR | Status: AC
  Filled 2020-03-10: qty 2

## 2020-03-10 MED ORDER — LIDOCAINE 2% (20 MG/ML) 5 ML SYRINGE
INTRAMUSCULAR | Status: AC
  Filled 2020-03-10: qty 5

## 2020-03-10 MED ORDER — POTASSIUM CHLORIDE CRYS ER 20 MEQ PO TBCR
40.0000 meq | EXTENDED_RELEASE_TABLET | Freq: Once | ORAL | Status: AC
  Administered 2020-03-10: 40 meq via ORAL
  Filled 2020-03-10: qty 2

## 2020-03-10 MED ORDER — PROPOFOL 10 MG/ML IV BOLUS
INTRAVENOUS | Status: AC
  Filled 2020-03-10: qty 20

## 2020-03-10 MED ORDER — ONDANSETRON HCL 4 MG/2ML IJ SOLN: 4.0000 mg | Freq: Once | INTRAMUSCULAR | Status: DC | PRN

## 2020-03-10 MED ORDER — ONDANSETRON HCL 4 MG/2ML IJ SOLN
INTRAMUSCULAR | Status: DC | PRN
  Administered 2020-03-10: 4 mg via INTRAVENOUS

## 2020-03-10 MED ORDER — 0.9 % SODIUM CHLORIDE (POUR BTL) OPTIME
TOPICAL | Status: DC | PRN
  Administered 2020-03-10: 1000 mL

## 2020-03-10 MED ORDER — MORPHINE SULFATE (PF) 2 MG/ML IV SOLN
0.5000 mg | INTRAVENOUS | Status: DC | PRN
  Administered 2020-03-10: 1 mg via INTRAVENOUS
  Filled 2020-03-10: qty 1

## 2020-03-10 MED ORDER — METOCLOPRAMIDE HCL 5 MG/ML IJ SOLN: 5.0000 mg | Freq: Three times a day (TID) | INTRAMUSCULAR | Status: DC | PRN

## 2020-03-10 MED ORDER — CEFAZOLIN SODIUM-DEXTROSE 2-4 GM/100ML-% IV SOLN
INTRAVENOUS | Status: AC
  Filled 2020-03-10: qty 100

## 2020-03-10 MED ORDER — CEFAZOLIN SODIUM-DEXTROSE 2-3 GM-%(50ML) IV SOLR
INTRAVENOUS | Status: DC | PRN
  Administered 2020-03-10: 2 g via INTRAVENOUS

## 2020-03-10 MED ORDER — PROPOFOL 10 MG/ML IV BOLUS
INTRAVENOUS | Status: AC
  Filled 2020-03-10: qty 40

## 2020-03-10 MED ORDER — METHOCARBAMOL 500 MG PO TABS
500.0000 mg | ORAL_TABLET | Freq: Four times a day (QID) | ORAL | Status: DC | PRN
  Administered 2020-03-10: 500 mg via ORAL
  Filled 2020-03-10: qty 1

## 2020-03-10 MED ORDER — ASPIRIN EC 325 MG PO TBEC
325.0000 mg | DELAYED_RELEASE_TABLET | Freq: Every day | ORAL | Status: DC
  Administered 2020-03-11 – 2020-03-14 (×4): 325 mg via ORAL
  Filled 2020-03-10 (×4): qty 1

## 2020-03-10 MED ORDER — CEFAZOLIN SODIUM-DEXTROSE 2-4 GM/100ML-% IV SOLN
2.0000 g | Freq: Four times a day (QID) | INTRAVENOUS | Status: AC
  Administered 2020-03-10 (×2): 2 g via INTRAVENOUS
  Filled 2020-03-10 (×2): qty 100

## 2020-03-10 MED ORDER — BUPIVACAINE HCL (PF) 0.5 % IJ SOLN
INTRAMUSCULAR | Status: DC | PRN
  Administered 2020-03-10: 2 mL via INTRATHECAL

## 2020-03-10 MED ORDER — PROPOFOL 10 MG/ML IV BOLUS
INTRAVENOUS | Status: DC | PRN
  Administered 2020-03-10 (×5): 20 mg via INTRAVENOUS

## 2020-03-10 MED ORDER — HYDROCODONE-ACETAMINOPHEN 5-325 MG PO TABS
1.0000 | ORAL_TABLET | ORAL | Status: DC | PRN
  Administered 2020-03-10: 2 via ORAL
  Administered 2020-03-11 – 2020-03-12 (×3): 1 via ORAL
  Filled 2020-03-10 (×3): qty 1
  Filled 2020-03-10: qty 2

## 2020-03-10 MED ORDER — FENTANYL CITRATE (PF) 100 MCG/2ML IJ SOLN
INTRAMUSCULAR | Status: DC | PRN
  Administered 2020-03-10: 15 ug via INTRATHECAL

## 2020-03-10 MED ORDER — LIDOCAINE 2% (20 MG/ML) 5 ML SYRINGE
INTRAMUSCULAR | Status: DC | PRN
  Administered 2020-03-10: 50 mg via INTRAVENOUS

## 2020-03-10 MED ORDER — METOCLOPRAMIDE HCL 5 MG PO TABS
5.0000 mg | ORAL_TABLET | Freq: Three times a day (TID) | ORAL | Status: DC | PRN
  Filled 2020-03-10: qty 2

## 2020-03-10 SURGICAL SUPPLY — 93 items
APL PRP STRL LF DISP 70% ISPRP (MISCELLANEOUS) ×1
APL SKNCLS STERI-STRIP NONHPOA (GAUZE/BANDAGES/DRESSINGS)
BAG HAMPER (MISCELLANEOUS) ×3 IMPLANT
BENZOIN TINCTURE PRP APPL 2/3 (GAUZE/BANDAGES/DRESSINGS) IMPLANT
BIT DRILL 2.8X128 (BIT) ×1 IMPLANT
BIT DRILL 2.8X128MM (BIT)
BIT DRILL 4.8X300 (BIT) ×2 IMPLANT
BIT DRILL 4.8X300MM (BIT) ×1
BLADE HEX COATED 2.75 (ELECTRODE) ×1 IMPLANT
BLADE SAGITTAL 25.0X1.27X90 (BLADE) ×1 IMPLANT
BLADE SAGITTAL 25.0X1.27X90MM (BLADE)
BLADE SURG SZ10 CARB STEEL (BLADE) ×6 IMPLANT
BNDG GAUZE ELAST 4 BULKY (GAUZE/BANDAGES/DRESSINGS) ×3 IMPLANT
BRUSH FEMORAL CANAL (MISCELLANEOUS) IMPLANT
CHLORAPREP W/TINT 26 (MISCELLANEOUS) ×3 IMPLANT
CLOSURE WOUND 1/2 X4 (GAUZE/BANDAGES/DRESSINGS)
CLOTH BEACON ORANGE TIMEOUT ST (SAFETY) ×3 IMPLANT
COVER LIGHT HANDLE STERIS (MISCELLANEOUS) ×6 IMPLANT
COVER MAYO STAND XLG (MISCELLANEOUS) ×2 IMPLANT
COVER WAND RF STERILE (DRAPES) ×3 IMPLANT
DECANTER SPIKE VIAL GLASS SM (MISCELLANEOUS) ×4 IMPLANT
DRAPE HIP W/POCKET STRL (MISCELLANEOUS) ×1 IMPLANT
DRAPE STERI IOBAN 125X83 (DRAPES) ×3 IMPLANT
DRAPE U-SHAPE 47X51 STRL (DRAPES) ×1 IMPLANT
DRESSING MEPILEX BORDER 6X8 (GAUZE/BANDAGES/DRESSINGS) ×1 IMPLANT
DRSG MEPILEX BORDER 4X12 (GAUZE/BANDAGES/DRESSINGS) ×1 IMPLANT
DRSG MEPILEX BORDER 6X8 (GAUZE/BANDAGES/DRESSINGS) ×3
DRSG MEPILEX SACRM 8.7X9.8 (GAUZE/BANDAGES/DRESSINGS) ×3 IMPLANT
ELECT REM PT RETURN 9FT ADLT (ELECTROSURGICAL) ×3
ELECTRODE REM PT RTRN 9FT ADLT (ELECTROSURGICAL) ×1 IMPLANT
GLOVE BIO SURGEON STRL SZ 6.5 (GLOVE) IMPLANT
GLOVE BIO SURGEONS STRL SZ 6.5 (GLOVE)
GLOVE BIOGEL M 7.0 STRL (GLOVE) ×2 IMPLANT
GLOVE BIOGEL PI IND STRL 7.0 (GLOVE) ×3 IMPLANT
GLOVE BIOGEL PI IND STRL 7.5 (GLOVE) IMPLANT
GLOVE BIOGEL PI INDICATOR 7.0 (GLOVE) ×6
GLOVE BIOGEL PI INDICATOR 7.5 (GLOVE) ×2
GLOVE ECLIPSE 6.5 STRL STRAW (GLOVE) ×4 IMPLANT
GLOVE SKINSENSE NS SZ8.0 LF (GLOVE) ×4
GLOVE SKINSENSE STRL SZ8.0 LF (GLOVE) ×2 IMPLANT
GLOVE SS N UNI LF 8.5 STRL (GLOVE) ×3 IMPLANT
GOWN STRL REUS W/TWL LRG LVL3 (GOWN DISPOSABLE) ×10 IMPLANT
GOWN STRL REUS W/TWL XL LVL3 (GOWN DISPOSABLE) ×3 IMPLANT
HANDPIECE INTERPULSE COAX TIP (DISPOSABLE)
INST SET MAJOR BONE (KITS) ×3 IMPLANT
KIT BLADEGUARD II DBL (SET/KITS/TRAYS/PACK) ×3 IMPLANT
KIT TURNOVER CYSTO (KITS) ×3 IMPLANT
KIT TURNOVER KIT A (KITS) ×3 IMPLANT
MANIFOLD NEPTUNE II (INSTRUMENTS) ×3 IMPLANT
MARKER SKIN DUAL TIP RULER LAB (MISCELLANEOUS) ×3 IMPLANT
NDL HYPO 18GX1.5 BLUNT FILL (NEEDLE) ×1 IMPLANT
NDL HYPO 21X1.5 SAFETY (NEEDLE) ×1 IMPLANT
NDL SPNL 18GX3.5 QUINCKE PK (NEEDLE) ×1 IMPLANT
NEEDLE HYPO 18GX1.5 BLUNT FILL (NEEDLE) IMPLANT
NEEDLE HYPO 21X1.5 SAFETY (NEEDLE) ×3 IMPLANT
NEEDLE SPNL 18GX3.5 QUINCKE PK (NEEDLE) ×3 IMPLANT
NS IRRIG 1000ML POUR BTL (IV SOLUTION) ×3 IMPLANT
PACK BASIC III (CUSTOM PROCEDURE TRAY) ×3
PACK SRG BSC III STRL LF ECLPS (CUSTOM PROCEDURE TRAY) ×1 IMPLANT
PACK TOTAL JOINT (CUSTOM PROCEDURE TRAY) ×1 IMPLANT
PAD ABD 5X9 TENDERSORB (GAUZE/BANDAGES/DRESSINGS) ×3 IMPLANT
PAD ARMBOARD 7.5X6 YLW CONV (MISCELLANEOUS) ×3 IMPLANT
PASSER SUT SWANSON 36MM LOOP (INSTRUMENTS) IMPLANT
PENCIL SMOKE EVACUATOR (MISCELLANEOUS) ×3 IMPLANT
PENCIL SMOKE EVACUATOR COATED (MISCELLANEOUS) ×1 IMPLANT
PIN GUIDE DRILL TIP 2.8X300 (DRILL) ×9 IMPLANT
PIN STMN SNGL STERILE 9X3.6MM (PIN) ×2 IMPLANT
SCREW CANN 6.5 90MM (Screw) ×6 IMPLANT
SCREW CANN 6.5 95MM (Screw) ×6 IMPLANT
SCREW CANN LG 6.5 FLT 90X22 (Screw) IMPLANT
SCREW CANN LG 6.5 FLT 95X22 (Screw) IMPLANT
SET BASIN LINEN APH (SET/KITS/TRAYS/PACK) ×3 IMPLANT
SET HNDPC FAN SPRY TIP SCT (DISPOSABLE) ×1 IMPLANT
SPONGE LAP 18X18 RF (DISPOSABLE) ×6 IMPLANT
STAPLER VISISTAT 35W (STAPLE) ×3 IMPLANT
STRIP CLOSURE SKIN 1/2X4 (GAUZE/BANDAGES/DRESSINGS) IMPLANT
SUT BRALON NAB BRD #1 30IN (SUTURE) ×6 IMPLANT
SUT ETHIBOND 5 LR DA (SUTURE) ×2 IMPLANT
SUT MNCRL 0 VIOLET CTX 36 (SUTURE) ×1 IMPLANT
SUT MON AB 0 CT1 (SUTURE) ×2 IMPLANT
SUT MON AB 2-0 CT1 36 (SUTURE) ×1 IMPLANT
SUT MONOCRYL 0 CTX 36 (SUTURE) ×3
SUT VIC AB 1 CT1 27 (SUTURE)
SUT VIC AB 1 CT1 27XBRD ANTBC (SUTURE) ×2 IMPLANT
SYR 20ML LL LF (SYRINGE) ×5 IMPLANT
SYR 30ML LL (SYRINGE) ×3 IMPLANT
SYR BULB IRRIG 60ML STRL (SYRINGE) ×6 IMPLANT
TOWER CARTRIDGE SMART MIX (DISPOSABLE) IMPLANT
TRAY FOLEY MTR SLVR 16FR STAT (SET/KITS/TRAYS/PACK) ×3 IMPLANT
WASHER ACECAN 6.5 (Washer) ×8 IMPLANT
WATER STERILE IRR 1000ML POUR (IV SOLUTION) ×6 IMPLANT
YANKAUER SUCT 12FT TUBE ARGYLE (SUCTIONS) ×1 IMPLANT
YANKAUER SUCT BULB TIP 10FT TU (MISCELLANEOUS) ×3 IMPLANT

## 2020-03-10 NOTE — Interval H&P Note (Signed)
History and Physical Interval Note:  03/10/2020 8:32 AM  Jeffrey Sherman  has presented today for surgery, with the diagnosis of Right hip fracture femoral neck.  The various methods of treatment have been discussed with the patient and family. After consideration of risks, benefits and other options for treatment, the patient has consented to  Procedure(s): CANNULATED HIP PINNING (Right) ARTHROPLASTY BIPOLAR HIP (HEMIARTHROPLASTY) (Right) as a surgical intervention.  The patient's history has been reviewed, patient examined, no change in status, stable for surgery.  I have reviewed the patient's chart and labs.  Questions were answered to the patient's satisfaction.     Arther Abbott

## 2020-03-10 NOTE — Interval H&P Note (Signed)
History and Physical Interval Note:  03/10/2020 8:28 AM  Jeffrey Sherman  has presented today for surgery, with the diagnosis of Right hip fracture femoral neck.  The various methods of treatment have been discussed with the patient and family. After consideration of risks, benefits and other options for treatment, the patient has consented to  Procedure(s): CANNULATED HIP PINNING (Right) ARTHROPLASTY BIPOLAR HIP (HEMIARTHROPLASTY) (Right) as a surgical intervention.  The patient's history has been reviewed, patient examined, no change in status, stable for surgery.  I have reviewed the patient's chart and labs.  Questions were answered to the patient's satisfaction.     Arther Abbott

## 2020-03-10 NOTE — Anesthesia Procedure Notes (Signed)
Date/Time: 03/10/2020 9:52 AM Performed by: Orlie Dakin, CRNA Pre-anesthesia Checklist: Patient identified, Emergency Drugs available, Suction available and Patient being monitored Oxygen Delivery Method: Non-rebreather mask Induction Type: IV induction Placement Confirmation: positive ETCO2

## 2020-03-10 NOTE — Progress Notes (Signed)
PROGRESS NOTE   Jeffrey Sherman  TWS:[id number] DOB: 1930-04-06 DOA: 03/09/2020 PCP: Celene Squibb, MD   Chief Complaint  Patient presents with  . Fall    Brief Admission History:   86 y.o. male, with history of peptic ulcer disease, hypertension, hard of hearing, hyperlipidemia, GERD, anxiety, and more presents to the ED with a chief complaint of fall and right hip pain.  Patient reports that at 7:30 PM he was walking to the kitchen when he tripped over the threshold in the doorway, and landed on his right hip.  Patient reports that he instantly felt sharp pain, especially when moving the extremity, but was trying not to come into the ER.  His family was unable to get him off of the commode around 12:30 AM so they called EMS.  In the interim they had given him hydrocodone, and a muscle relaxer, and neither had much of an effect on his pain.  He reports that at rest his pain is a 5-6 out of 10, and when he is moving it severe.  Patient reports that before his fall he had no lightheadedness, dizziness, chest pain, palpitations, shortness of breath.  After his fall he did have nausea that he attributes to the pain.  Patient reports that he has had a cough over the last week.  He went to PCP virtual visit and was given a Z-Pak.  This did not improve his cough.  Patient has no other acute complaints at this time.  Assessment & Plan:   Principal Problem:   Closed displaced fracture of right femoral neck (HCC) Active Problems:   Dyspnea   Essential hypertension   High cholesterol   Gastroesophageal reflux disease without esophagitis   Renal insufficiency  1. POD #0 s/p right hip fracture repair - per Dr. Aline Brochure, weightbearing as tolerated, no hip precautions, DVT prophylaxis for 35 days, staples to come otu on postop day 14, first postop visit in 2 weeks.   2. Hypertension - pt has been restarted on his home meds and BP is better controlled.  3. AKI - prerenal - resolved with IV fluid  hydration.   4. Acute blood loss anemia - likely from hip fracture and operative repair, following Hg, transfuse if needed, Hg on down to 11.3.  5. GERD - protonix ordered for GI protection.  6. Reactive leukocytosis - no s/s of infection found.   DVT prophylaxis: aspirin  Code Status: full  Family Communication:  Disposition: [facility]   Status is: Inpatient  Remains inpatient appropriate because:Inpatient level of care appropriate due to severity of illness  Dispo: The patient is from: Home              Anticipated d/c is to: SNF              Anticipated d/c date is: 1 day              Patient currently is not medically stable to d/c. Consultants:   Orthopedics   Procedures:   Internal fixation right hip   Antimicrobials:    Subjective: Pt reports pain is controlled.  No complaints.    Objective: Vitals:   03/10/20 1145 03/10/20 1200 03/10/20 1215 03/10/20 1434  BP: (!) 148/76 (!) 158/84 (!) 161/99 140/74  Pulse: 80 89 91 88  Resp: (!) 22 11 19 20   Temp:    99.3 F (37.4 C)  TempSrc:    Oral  SpO2: 100% 95% 94% 93%  Weight:  Height:        Intake/Output Summary (Last 24 hours) at 03/10/2020 1457 Last data filed at 03/10/2020 1215 Gross per 24 hour  Intake 2260.18 ml  Output 1200 ml  Net 1060.18 ml   Filed Weights   03/09/20 0206 03/10/20 0806  Weight: 90 kg 90 kg    Examination:  General exam: Appears calm and comfortable  Respiratory system: Clear to auscultation. Respiratory effort normal. Cardiovascular system: S1 & S2 heard, RRR. No JVD, murmurs, rubs, gallops or clicks. No pedal edema. Gastrointestinal system: Abdomen is nondistended, soft and nontender. No organomegaly or masses felt. Normal bowel sounds heard. Central nervous system: Alert and oriented. No focal neurological deficits. Extremities: wound clean and dry and intact. Good pedal pulses BLEs.  Skin: No rashes, lesions or ulcers Psychiatry: Judgement and insight appear normal. Mood  & affect appropriate.   Data Reviewed: I have personally reviewed following labs and imaging studies  CBC: Recent Labs  Lab 03/09/20 0259 03/10/20 0328  WBC 13.7* 11.2*  NEUTROABS 12.2* 9.0*  HGB 12.1* 11.3*  HCT 37.7* 35.9*  MCV 80.4 82.7  PLT 205 235    Basic Metabolic Panel: Recent Labs  Lab 03/09/20 0259 03/10/20 0328  NA 141 139  K 3.2* 3.3*  CL 100 101  CO2 31 30  GLUCOSE 208* 160*  BUN 21 18  CREATININE 1.50* 1.21  CALCIUM 9.2 8.8*  MG  --  2.3    GFR: Estimated Creatinine Clearance: 46.8 mL/min (by C-G formula based on SCr of 1.21 mg/dL).  Liver Function Tests: Recent Labs  Lab 03/10/20 0328  AST 17  ALT 16  ALKPHOS 46  BILITOT 0.7  PROT 5.6*  ALBUMIN 3.0*    CBG: Recent Labs  Lab 03/09/20 1106 03/09/20 1623 03/09/20 2158 03/10/20 0406 03/10/20 1143  GLUCAP 150* 167* 188* 162* 141*    Recent Results (from the past 240 hour(s))  Respiratory Panel by RT PCR (Flu A&B, Covid) - Nasopharyngeal Swab     Status: None   Collection Time: 03/09/20  3:55 AM   Specimen: Nasopharyngeal Swab  Result Value Ref Range Status   SARS Coronavirus 2 by RT PCR NEGATIVE NEGATIVE Final    Comment: (NOTE) SARS-CoV-2 target nucleic acids are NOT DETECTED.  The SARS-CoV-2 RNA is generally detectable in upper respiratoy specimens during the acute phase of infection. The lowest concentration of SARS-CoV-2 viral copies this assay can detect is 131 copies/mL. A negative result does not preclude SARS-Cov-2 infection and should not be used as the sole basis for treatment or other patient management decisions. A negative result may occur with  improper specimen collection/handling, submission of specimen other than nasopharyngeal swab, presence of viral mutation(s) within the areas targeted by this assay, and inadequate number of viral copies (<131 copies/mL). A negative result must be combined with clinical observations, patient history, and epidemiological  information. The expected result is Negative.  Fact Sheet for Patients:  PinkCheek.be  Fact Sheet for Healthcare Providers:  GravelBags.it  This test is no t yet approved or cleared by the [place] FDA and  has been authorized for detection and/or diagnosis of SARS-CoV-2 by FDA under an Emergency Use Authorization (EUA). This EUA will remain  in effect (meaning this test can be used) for the duration of the COVID-19 declaration under Section 564(b)(1) of the Act, 21 U.S.C. section 360bbb-3(b)(1), unless the authorization is terminated or revoked sooner.     Influenza A by PCR NEGATIVE NEGATIVE Final   Influenza B  by PCR NEGATIVE NEGATIVE Final    Comment: (NOTE) The Xpert Xpress SARS-CoV-2/FLU/RSV assay is intended as an aid in  the diagnosis of influenza from Nasopharyngeal swab specimens and  should not be used as a sole basis for treatment. Nasal washings and  aspirates are unacceptable for Xpert Xpress SARS-CoV-2/FLU/RSV  testing.  Fact Sheet for Patients: PinkCheek.be  Fact Sheet for Healthcare Providers: GravelBags.it  This test is not yet approved or cleared by the [place] FDA and  has been authorized for detection and/or diagnosis of SARS-CoV-2 by  FDA under an Emergency Use Authorization (EUA). This EUA will remain  in effect (meaning this test can be used) for the duration of the  Covid-19 declaration under Section 564(b)(1) of the Act, 21  U.S.C. section 360bbb-3(b)(1), unless the authorization is  terminated or revoked. Performed at [facility], [address], [place], [place] [address]   Surgical PCR screen     Status: None   Collection Time: 03/09/20 11:31 AM   Specimen: Nasal Mucosa; Nasal Swab  Result Value Ref Range Status   MRSA, PCR NEGATIVE NEGATIVE Final   Staphylococcus aureus NEGATIVE NEGATIVE Final    Comment: (NOTE) The  Xpert SA Assay (FDA approved for NASAL specimens in patients 32 years of age and older), is one component of a comprehensive surveillance program. It is not intended to diagnose infection nor to guide or monitor treatment. Performed at [facility], [address], [place],  [address]      Radiology Studies: [facility] Chest [facility] 1 View  Result Date: 03/09/2020 CLINICAL DATA:  Right hip pain after a fall. History of prostate cancer. EXAM: PORTABLE CHEST 1 VIEW COMPARISON:  06/06/2018 FINDINGS: Heart size and pulmonary vascularity are normal. Peribronchial thickening with streaky perihilar infiltrates. This could represent bronchitis or multifocal pneumonia. No focal consolidation. No pleural effusions. No pneumothorax. Mediastinal contours appear intact. Calcified and tortuous aorta. Degenerative changes in the spine and shoulders. IMPRESSION: Peribronchial thickening with streaky perihilar infiltrates suggesting bronchitis or multifocal pneumonia. Electronically Signed   By: Lucienne Capers M.D.   On: 03/09/2020 02:47   DG HIP OPERATIVE UNILAT WITH PELVIS RIGHT  Result Date: 03/10/2020 CLINICAL DATA:  Fracture fixation EXAM: OPERATIVE right HIP (WITH PELVIS IF PERFORMED) 8 VIEWS TECHNIQUE: Fluoroscopic spot image(s) were submitted for interpretation post-operatively. COMPARISON:  03/09/2020 FINDINGS: Right femoral neck fracture fixed with 4 screws across the fracture. Satisfactory fracture alignment. Satisfactory hardware placement. IMPRESSION: Screw fixation of right femoral neck fracture. Electronically Signed   By: Franchot Gallo M.D.   On: 03/10/2020 11:46   DG Hip Unilat W or Wo Pelvis 2-3 Views Right  Result Date: 03/09/2020 CLINICAL DATA:  Right hip pain after fall EXAM: DG HIP (WITH OR WITHOUT PELVIS) 2-3V RIGHT COMPARISON:  None. FINDINGS: There is a minimally displaced fracture of the right femoral neck with mild shortening. No dislocation. No other pelvic fracture. Left hip normal.  IMPRESSION: Minimally displaced right femoral neck fracture. Electronically Signed   By: Ulyses Jarred M.D.   On: 03/09/2020 02:45   Scheduled Meds: . [START ON 03/11/2020] aspirin EC  325 mg Oral Q breakfast  . Chlorhexidine Gluconate Cloth  6 each Topical Daily  . diltiazem  240 mg Oral Daily  . docusate sodium  100 mg Oral BID  . hydrochlorothiazide  25 mg Oral Daily  . influenza vaccine adjuvanted  0.5 mL Intramuscular Tomorrow-1000  . insulin aspart  0-6 Units Subcutaneous TID WC  . lisinopril  40 mg Oral  Daily  . pantoprazole  40 mg Oral Daily  . potassium chloride  40 mEq Oral Once  . simvastatin  10 mg Oral QHS  . terazosin  5 mg Oral BID  . traMADol  50 mg Oral Q6H  . venlafaxine XR  75 mg Oral Daily   Continuous Infusions: . sodium chloride 50 mL/hr at 03/09/20 1714  . sodium chloride 75 mL/hr at 03/10/20 1401  . azithromycin 500 mg (03/09/20 0844)  .  ceFAZolin (ANCEF) IV 2 g (03/10/20 1304)  . cefTRIAXone (ROCEPHIN)  IV 2 g (03/10/20 0545)  . methocarbamol (ROBAXIN) IV      LOS: 1 day   Time spent: 30 minutes   Lorik Guo Wynetta Emery, MD How to contact the [facility] Attending or Consulting provider [place] or covering provider during after hours [place], for this patient?  1. Check the care team in [facility] and look for a) attending/consulting [facility] provider listed and b) the [facility] team listed 2. Log into [email or web address] and use [place]'s universal password to access. If you do not have the password, please contact the hospital operator. 3. Locate the [facility] provider you are looking for under [facility] and page to a number that you can be directly reached. 4. If you still have difficulty reaching the provider, please page the [facility] (Director on Call) for the Hospitalists listed on amion for assistance.  03/10/2020, 2:57 PM

## 2020-03-10 NOTE — Brief Op Note (Signed)
03/10/2020  11:09 AM  PATIENT:  Jeffrey Sherman  86 y.o. male  PRE-OPERATIVE DIAGNOSIS:  Right hip fracture femoral neck  POST-OPERATIVE DIAGNOSIS:  Right hip fracture femoral neck  PROCEDURE:  Procedure(s): INTERNAL FIXATION RIGHT HIP (Right)   Implants Biomet titanium screws x4 with 4 washers.  2 screws were placed superiorly 90 mm and 2 inferior 95 mm all 4 screws had washers  Findings apex anterior angulation femoral neck fracture.  SURGEON:  Surgeon(s) and Role:    * Carole Civil, MD - Primary  PHYSICIAN ASSISTANT:   ASSISTANTS: none   ANESTHESIA:   spinal  EBL:  50  BLOOD ADMINISTERED:none  DRAINS: none   LOCAL MEDICATIONS USED:  MARCAINE     SPECIMEN:  No Specimen  DISPOSITION OF SPECIMEN:  N/A  COUNTS:  YES  TOURNIQUET:  * No tourniquets in log *  DICTATION: .Dragon Dictation  PLAN OF CARE: Admit to inpatient   PATIENT DISPOSITION:  PACU - hemodynamically stable.   Delay start of Pharmacological VTE agent (>24hrs) due to surgical blood loss or risk of bleeding: yes  Surgical dictation  Jeffrey Sherman was seen in the preop area his hip was reevaluated and he was reevaluated and I cleared him for surgery.  I updated the chart, checked the x-rays and check for implants which were available.  Once in the operating room and a spinal anesthetic was placed by anesthesia and then he was placed on the fracture table with a perineal post and a Foley catheter inserted.  The leg was placed in traction on the right and the left leg was padded and placed in a well leg holder in abduction flexion and external rotation.  The C-arm was brought in  A mini timeout was performed and radiographs were taken.  The fracture was reduced with traction and internal rotation.  There was minimal if any shortening and the primary deformity was external rotation.  After the anterior cortex of the femoral neck was reestablished using the concave contour concept the leg was  prepped and draped sterilely  Timeout was completed  The incision was made over the greater trochanter extended distally the subcutaneous tissue was divided down to the fascia.  The fascia was split in line with the skin incision and an L-shaped detachment of the vastus lateralis from the greater trochanter was performed followed by subperiosteal dissection and a retractor was placed to hold the muscle back.  A cannulated pin guide was used to place 2 pins superiorly and 2 pins inferiorly and a box shaped configuration  Once the pins were confirmed to be in place superiorly they were measured the cortex was drilled and 2 screws were placed with washers  We repeated this distally along the femoral neck.  Final imaging confirmed good pin placement and maintenance of reduction  The wound was irrigated with copious amounts of saline and the vastus lateralis was closed back to the greater trochanter with #1 Braylon followed by fascial closure with #1 Braylon and subcutaneous closure with 0 Monocryl.  Skin closed with staples.  30 cc of Marcaine with epinephrine was injected below the fascia and below the muscle  A sterile dressing was applied  The patient was taken off the fracture table carefully placed on the regular bed and taken to recovery room in stable condition  Postoperative plan  He can now be weightbearing as tolerated There are no hip precautions DVT prophylaxis for 35 days Staples come out on postop day 14 First  postop visit postop day 14 gave or take 1 or 2 days

## 2020-03-10 NOTE — Anesthesia Postprocedure Evaluation (Signed)
Anesthesia Post Note  Patient: Jeffrey Sherman  Procedure(s) Performed: INTERNAL FIXATION RIGHT HIP (Right Hip)  Patient location during evaluation: PACU Anesthesia Type: Spinal and General Level of consciousness: awake and alert Pain management: pain level controlled Vital Signs Assessment: post-procedure vital signs reviewed and stable Respiratory status: spontaneous breathing, nonlabored ventilation and respiratory function stable Cardiovascular status: blood pressure returned to baseline Postop Assessment: no apparent nausea or vomiting and no headache Anesthetic complications: no   No complications documented.   Last Vitals:  Vitals:   03/10/20 1145 03/10/20 1200  BP: (!) 148/76 (!) 158/84  Pulse: 80 89  Resp: (!) 22 11  Temp:    SpO2: 100% 95%    Last Pain:  Vitals:   03/10/20 1200  TempSrc:   PainSc: (P) 0-No pain                 Orlie Dakin

## 2020-03-10 NOTE — Anesthesia Procedure Notes (Signed)
Spinal  Patient location during procedure: OR Start time: 03/10/2020 9:19 AM End time: 03/10/2020 9:29 AM Staffing Performed: anesthesiologist  Anesthesiologist: Denese Killings, MD Resident/CRNA: Orlie Dakin, CRNA Preanesthetic Checklist Completed: patient identified, IV checked, site marked, risks and benefits discussed, surgical consent, monitors and equipment checked, pre-op evaluation and timeout performed Spinal Block Patient position: left lateral decubitus Prep: Betadine Patient monitoring: heart rate, cardiac monitor, continuous pulse ox and blood pressure Approach: midline Location: L3-4 Injection technique: single-shot Needle Needle type: Sprotte  Needle gauge: 24 G Needle length: 9 cm Needle insertion depth: 7 cm Assessment Sensory level: T8

## 2020-03-10 NOTE — Op Note (Signed)
03/10/2020  11:15 AM  PATIENT:  Jeffrey Sherman  86 y.o. male  PRE-OPERATIVE DIAGNOSIS:  Right hip fracture femoral neck  POST-OPERATIVE DIAGNOSIS:  Right hip fracture femoral neck  PROCEDURE:  Procedure(s): INTERNAL FIXATION RIGHT HIP (Right)   Implants Biomet titanium screws x4 with 4 washers.  2 screws were placed superiorly 90 mm and 2 inferior 95 mm all 4 screws had washers  Findings apex anterior angulation femoral neck fracture.  SURGEON:  Surgeon(s) and Role:    * Carole Civil, MD - Primary  PHYSICIAN ASSISTANT:   ASSISTANTS: none   ANESTHESIA:   spinal  EBL:  50  BLOOD ADMINISTERED:none  DRAINS: none   LOCAL MEDICATIONS USED:  MARCAINE     SPECIMEN:  No Specimen  DISPOSITION OF SPECIMEN:  N/A  COUNTS:  YES  TOURNIQUET:  * No tourniquets in log *  DICTATION: .Dragon Dictation  PLAN OF CARE: Admit to inpatient   PATIENT DISPOSITION:  PACU - hemodynamically stable.   Delay start of Pharmacological VTE agent (>24hrs) due to surgical blood loss or risk of bleeding: yes  Surgical dictation  Jeffrey Sherman was seen in the preop area his hip was reevaluated and he was reevaluated and I cleared him for surgery.  I updated the chart, checked the x-rays and check for implants which were available.  Once in the operating room and a spinal anesthetic was placed by anesthesia and then he was placed on the fracture table with a perineal post and a Foley catheter inserted.  The leg was placed in traction on the right and the left leg was padded and placed in a well leg holder in abduction flexion and external rotation.  The C-arm was brought in  A mini timeout was performed and radiographs were taken.  The fracture was reduced with traction and internal rotation.  There was minimal if any shortening and the primary deformity was external rotation.  After the anterior cortex of the femoral neck was reestablished using the concave contour concept the leg was  prepped and draped sterilely  Timeout was completed  The incision was made over the greater trochanter extended distally the subcutaneous tissue was divided down to the fascia.  The fascia was split in line with the skin incision and an L-shaped detachment of the vastus lateralis from the greater trochanter was performed followed by subperiosteal dissection and a retractor was placed to hold the muscle back.  A cannulated pin guide was used to place 2 pins superiorly and 2 pins inferiorly and a box shaped configuration  Once the pins were confirmed to be in place superiorly they were measured the cortex was drilled and 2 screws were placed with washers  We repeated this distally along the femoral neck.  Final imaging confirmed good pin placement and maintenance of reduction  The wound was irrigated with copious amounts of saline and the vastus lateralis was closed back to the greater trochanter with #1 Braylon followed by fascial closure with #1 Braylon and subcutaneous closure with 0 Monocryl.  Skin closed with staples.  30 cc of Marcaine with epinephrine was injected below the fascia and below the muscle  A sterile dressing was applied  The patient was taken off the fracture table carefully placed on the regular bed and taken to recovery room in stable condition  Postoperative plan  He can now be weightbearing as tolerated There are no hip precautions DVT prophylaxis for 35 days Staples come out on postop day 14 First  postop visit postop day 14 gave or take 1 or 2 days   Cpt code [phone]

## 2020-03-10 NOTE — Care Management Important Message (Signed)
Important Message  Patient Details  Name: Jeffrey Sherman MRN: [id number] Date of Birth: 03/21/1930   Medicare Important Message Given:  Yes     Tommy Medal 03/10/2020, 3:57 PM

## 2020-03-10 NOTE — Transfer of Care (Signed)
Immediate Anesthesia Transfer of Care Note  Patient: Jeffrey Sherman  Procedure(s) Performed: INTERNAL FIXATION RIGHT HIP (Right Hip)  Patient Location: PACU  Anesthesia Type:General and Spinal  Level of Consciousness: awake and patient cooperative  Airway & Oxygen Therapy: Patient Spontanous Breathing and Patient connected to face mask oxygen  Post-op Assessment: Report given to RN and Post -op Vital signs reviewed and stable  Post vital signs: Reviewed and stable  Last Vitals:  Vitals Value Taken Time  BP 119/70 03/10/20 1112  Temp    Pulse 80 03/10/20 1115  Resp 20 03/10/20 1115  SpO2 99 % 03/10/20 1115  Vitals shown include unvalidated device data.  Last Pain:  Vitals:   03/10/20 0806  TempSrc:   PainSc: 0-No pain         Complications: No complications documented.

## 2020-03-10 NOTE — Brief Op Note (Signed)
03/10/2020  11:15 AM  PATIENT:  Jeffrey Sherman  86 y.o. male  PRE-OPERATIVE DIAGNOSIS:  Right hip fracture femoral neck  POST-OPERATIVE DIAGNOSIS:  Right hip fracture femoral neck  PROCEDURE:  Procedure(s): INTERNAL FIXATION RIGHT HIP (Right)   Implants Biomet titanium screws x4 with 4 washers.  2 screws were placed superiorly 90 mm and 2 inferior 95 mm all 4 screws had washers  Findings apex anterior angulation femoral neck fracture.  SURGEON:  Surgeon(s) and Role:    * Carole Civil, MD - Primary  PHYSICIAN ASSISTANT:   ASSISTANTS: none   ANESTHESIA:   spinal  EBL:  50  BLOOD ADMINISTERED:none  DRAINS: none   LOCAL MEDICATIONS USED:  MARCAINE     SPECIMEN:  No Specimen  DISPOSITION OF SPECIMEN:  N/A  COUNTS:  YES  TOURNIQUET:  * No tourniquets in log *  DICTATION: .Dragon Dictation  PLAN OF CARE: Admit to inpatient   PATIENT DISPOSITION:  PACU - hemodynamically stable.   Delay start of Pharmacological VTE agent (>24hrs) due to surgical blood loss or risk of bleeding: yes  Surgical dictation  Mr. Barg was seen in the preop area his hip was reevaluated and he was reevaluated and I cleared him for surgery.  I updated the chart, checked the x-rays and check for implants which were available.  Once in the operating room and a spinal anesthetic was placed by anesthesia and then he was placed on the fracture table with a perineal post and a Foley catheter inserted.  The leg was placed in traction on the right and the left leg was padded and placed in a well leg holder in abduction flexion and external rotation.  The C-arm was brought in  A mini timeout was performed and radiographs were taken.  The fracture was reduced with traction and internal rotation.  There was minimal if any shortening and the primary deformity was external rotation.  After the anterior cortex of the femoral neck was reestablished using the concave contour concept the leg was  prepped and draped sterilely  Timeout was completed  The incision was made over the greater trochanter extended distally the subcutaneous tissue was divided down to the fascia.  The fascia was split in line with the skin incision and an L-shaped detachment of the vastus lateralis from the greater trochanter was performed followed by subperiosteal dissection and a retractor was placed to hold the muscle back.  A cannulated pin guide was used to place 2 pins superiorly and 2 pins inferiorly and a box shaped configuration  Once the pins were confirmed to be in place superiorly they were measured the cortex was drilled and 2 screws were placed with washers  We repeated this distally along the femoral neck.  Final imaging confirmed good pin placement and maintenance of reduction  The wound was irrigated with copious amounts of saline and the vastus lateralis was closed back to the greater trochanter with #1 Braylon followed by fascial closure with #1 Braylon and subcutaneous closure with 0 Monocryl.  Skin closed with staples.  30 cc of Marcaine with epinephrine was injected below the fascia and below the muscle  A sterile dressing was applied  The patient was taken off the fracture table carefully placed on the regular bed and taken to recovery room in stable condition  Postoperative plan  He can now be weightbearing as tolerated There are no hip precautions DVT prophylaxis for 35 days Staples come out on postop day 14 First  postop visit postop day 14 gave or take 1 or 2 days

## 2020-03-10 NOTE — TOC Progression Note (Signed)
Transition of Care [facility]) - Progression Note    Patient Details  Name: Jeffrey Sherman MRN: [id number] Date of Birth: [birth year]  Transition of Care [facility]) CM/SW Contact  Boneta Lucks, RN Phone Number: 03/10/2020, 11:43 AM  Clinical Narrative:   Patient is in surgery. TOC meeting with Silva Bandy, copy of vaccine card provided and faxed to [facility] at [facility].  Bed will be available on Monday. MD's updated.    Expected Discharge Plan: Skilled Nursing Facility Barriers to Discharge: Continued Medical Work up  Expected Discharge Plan and Services Expected Discharge Plan: [place]

## 2020-03-11 DIAGNOSIS — K219 Gastro-esophageal reflux disease without esophagitis: Secondary | ICD-10-CM | POA: Diagnosis not present

## 2020-03-11 DIAGNOSIS — E7800 Pure hypercholesterolemia, unspecified: Secondary | ICD-10-CM | POA: Diagnosis not present

## 2020-03-11 DIAGNOSIS — I10 Essential (primary) hypertension: Secondary | ICD-10-CM | POA: Diagnosis not present

## 2020-03-11 DIAGNOSIS — S72001A Fracture of unspecified part of neck of right femur, initial encounter for closed fracture: Secondary | ICD-10-CM | POA: Diagnosis not present

## 2020-03-11 LAB — BASIC METABOLIC PANEL
Anion gap: 7 (ref 5–15)
BUN: 24 mg/dL — ABNORMAL HIGH (ref 8–23)
CO2: 29 mmol/L (ref 22–32)
Calcium: 8.5 mg/dL — ABNORMAL LOW (ref 8.9–10.3)
Chloride: 102 mmol/L (ref 98–111)
Creatinine, Ser: 1.4 mg/dL — ABNORMAL HIGH (ref 0.61–1.24)
GFR calc Af Amer: 52 mL/min — ABNORMAL LOW (ref >60)
GFR calc non Af Amer: 45 mL/min — ABNORMAL LOW (ref >60)
Glucose, Bld: 169 mg/dL — ABNORMAL HIGH (ref 70–99)
Potassium: 3.8 mmol/L (ref 3.5–5.1)
Sodium: 138 mmol/L (ref 135–145)

## 2020-03-11 LAB — CBC
HCT: 32.7 % — ABNORMAL LOW (ref 39.0–52.0)
Hemoglobin: 9.9 g/dL — ABNORMAL LOW (ref 13.0–17.0)
MCH: 25.3 pg — ABNORMAL LOW (ref 26.0–34.0)
MCHC: 30.3 g/dL (ref 30.0–36.0)
MCV: 83.4 fL (ref 80.0–100.0)
Platelets: 153 10*3/uL (ref 150–400)
RBC: 3.92 MIL/uL — ABNORMAL LOW (ref 4.22–5.81)
RDW: 15.3 % (ref 11.5–15.5)
WBC: 12 10*3/uL — ABNORMAL HIGH (ref 4.0–10.5)
nRBC: 0 % (ref 0.0–0.2)

## 2020-03-11 LAB — GLUCOSE, CAPILLARY
Glucose-Capillary: 172 mg/dL — ABNORMAL HIGH (ref 70–99)
Glucose-Capillary: 151 mg/dL — ABNORMAL HIGH (ref 70–99)
Glucose-Capillary: 137 mg/dL — ABNORMAL HIGH (ref 70–99)
Glucose-Capillary: 172 mg/dL — ABNORMAL HIGH (ref 70–99)
Glucose-Capillary: 171 mg/dL — ABNORMAL HIGH (ref 70–99)
Glucose-Capillary: 156 mg/dL — ABNORMAL HIGH (ref 70–99)

## 2020-03-11 LAB — LEGIONELLA PNEUMOPHILA SEROGP 1 UR AG: L. pneumophila Serogp 1 Ur Ag: NEGATIVE

## 2020-03-11 MED ORDER — SODIUM CHLORIDE 0.9 % IV BOLUS
500.0000 mL | Freq: Once | INTRAVENOUS | Status: AC
  Administered 2020-03-11: 500 mL via INTRAVENOUS

## 2020-03-11 MED ORDER — SODIUM CHLORIDE 0.9 % IV BOLUS
250.0000 mL | Freq: Once | INTRAVENOUS | Status: AC
  Administered 2020-03-11: 250 mL via INTRAVENOUS

## 2020-03-11 NOTE — Progress Notes (Addendum)
Patient's foley removed at 0930 this am. 40cc of urine returned from In and Out cath this afternoon at 1800, MD aware and orders given

## 2020-03-11 NOTE — Progress Notes (Signed)
PROGRESS NOTE   Jeffrey Sherman  JKK:[id number] DOB: June 25, 1929 DOA: 03/09/2020 PCP: Celene Squibb, MD   Chief Complaint  Patient presents with  . Fall    Brief Admission History:   86 y.o. male, with history of peptic ulcer disease, hypertension, hard of hearing, hyperlipidemia, GERD, anxiety, and more presents to the ED with a chief complaint of fall and right hip pain.  Patient reports that at 7:30 PM he was walking to the kitchen when he tripped over the threshold in the doorway, and landed on his right hip.  Patient reports that he instantly felt sharp pain, especially when moving the extremity, but was trying not to come into the ER.  His family was unable to get him off of the commode around 12:30 AM so they called EMS.  In the interim they had given him hydrocodone, and a muscle relaxer, and neither had much of an effect on his pain.  He reports that at rest his pain is a 5-6 out of 10, and when he is moving it severe.  Patient reports that before his fall he had no lightheadedness, dizziness, chest pain, palpitations, shortness of breath.  After his fall he did have nausea that he attributes to the pain.  Patient reports that he has had a cough over the last week.  He went to PCP virtual visit and was given a Z-Pak.  This did not improve his cough.  Patient has no other acute complaints at this time.  Assessment & Plan:   Principal Problem:   Closed displaced fracture of right femoral neck (HCC) Active Problems:   Dyspnea   Essential hypertension   High cholesterol   Gastroesophageal reflux disease without esophagitis   Renal insufficiency  1. POD #1 s/p right hip fracture repair - per Dr. Aline Brochure, weightbearing as tolerated, no hip precautions, DVT prophylaxis for 35 days, staples to come out on postop day 14, first postop visit in 2 weeks.   2. Hypertension - pt has been restarted on his home meds and BP is better controlled.  3. AKI - prerenal - treating with IV fluid  hydration.   4. Acute blood loss anemia - likely from hip fracture and operative repair, following Hg, transfuse if needed, Hg on down to 11.3.  5. GERD - protonix ordered for GI protection.  6. Reactive leukocytosis - no s/s of infection found.   DVT prophylaxis: aspirin  Code Status: full  Family Communication: updated at bedside Disposition: [facility]   Status is: Inpatient  Remains inpatient appropriate because:Inpatient level of care appropriate due to severity of illness  Dispo: The patient is from: Home              Anticipated d/c is to: SNF              Anticipated d/c date is: 2 days              Patient currently is not medically stable to d/c. Consultants:   Orthopedics   Procedures:   Internal fixation right hip   Antimicrobials:    Subjective: Pt having intermittent confusion.   Needs soft diet due to few remaining teeth.     Objective: Vitals:   03/10/20 1652 03/10/20 1653 03/10/20 2141 03/11/20 0559  BP:   127/62 138/70  Pulse:   78 85  Resp:   16 14  Temp:   (!) 97.1 F (36.2 C) 97.9 F (36.6 C)  TempSrc:   Oral Axillary  SpO2: (!) 85% 91% 94% 94%  Weight:      Height:        Intake/Output Summary (Last 24 hours) at 03/11/2020 1245 Last data filed at 03/11/2020 0900 Gross per 24 hour  Intake 2240.44 ml  Output 850 ml  Net 1390.44 ml   Filed Weights   03/09/20 0206 03/10/20 0806  Weight: 90 kg 90 kg    Examination:  General exam: Appears calm and comfortable  Respiratory system: Clear to auscultation. Respiratory effort normal. Cardiovascular system: S1 & S2 heard, RRR. No JVD, murmurs, rubs, gallops or clicks. No pedal edema. Gastrointestinal system: Abdomen is nondistended, soft and nontender. No organomegaly or masses felt. Normal bowel sounds heard. Central nervous system: Alert and oriented. No focal neurological deficits. Extremities: wound clean and dry and intact. Good pedal pulses BLEs.  Skin: No rashes, lesions or  ulcers Psychiatry: Judgement and insight appear normal. Mood & affect appropriate.   Data Reviewed: I have personally reviewed following labs and imaging studies  CBC: Recent Labs  Lab 03/09/20 0259 03/10/20 0328 03/11/20 0702  WBC 13.7* 11.2* 12.0*  NEUTROABS 12.2* 9.0*  --   HGB 12.1* 11.3* 9.9*  HCT 37.7* 35.9* 32.7*  MCV 80.4 82.7 83.4  PLT 205 188 262    Basic Metabolic Panel: Recent Labs  Lab 03/09/20 0259 03/10/20 0328 03/11/20 0702  NA 141 139 138  K 3.2* 3.3* 3.8  CL 100 101 102  CO2 31 30 29   GLUCOSE 208* 160* 169*  BUN 21 18 24*  CREATININE 1.50* 1.21 1.40*  CALCIUM 9.2 8.8* 8.5*  MG  --  2.3  --     GFR: Estimated Creatinine Clearance: 40.4 mL/min (A) (by C-G formula based on SCr of 1.4 mg/dL (H)).  Liver Function Tests: Recent Labs  Lab 03/10/20 0328  AST 17  ALT 16  ALKPHOS 46  BILITOT 0.7  PROT 5.6*  ALBUMIN 3.0*    CBG: Recent Labs  Lab 03/10/20 1637 03/10/20 2145 03/11/20 0258 03/11/20 0737 03/11/20 1133  GLUCAP 165* 141* 172* 151* 137*    Recent Results (from the past 240 hour(s))  Respiratory Panel by RT PCR (Flu A&B, Covid) - Nasopharyngeal Swab     Status: None   Collection Time: 03/09/20  3:55 AM   Specimen: Nasopharyngeal Swab  Result Value Ref Range Status   SARS Coronavirus 2 by RT PCR NEGATIVE NEGATIVE Final    Comment: (NOTE) SARS-CoV-2 target nucleic acids are NOT DETECTED.  The SARS-CoV-2 RNA is generally detectable in upper respiratoy specimens during the acute phase of infection. The lowest concentration of SARS-CoV-2 viral copies this assay can detect is 131 copies/mL. A negative result does not preclude SARS-Cov-2 infection and should not be used as the sole basis for treatment or other patient management decisions. A negative result may occur with  improper specimen collection/handling, submission of specimen other than nasopharyngeal swab, presence of viral mutation(s) within the areas targeted by this  assay, and inadequate number of viral copies (<131 copies/mL). A negative result must be combined with clinical observations, patient history, and epidemiological information. The expected result is Negative.  Fact Sheet for Patients:  PinkCheek.be  Fact Sheet for Healthcare Providers:  GravelBags.it  This test is no t yet approved or cleared by the [place] FDA and  has been authorized for detection and/or diagnosis of SARS-CoV-2 by FDA under an Emergency Use Authorization (EUA). This EUA will remain  in effect (meaning this test can be used) for the  duration of the COVID-19 declaration under Section 564(b)(1) of the Act, 21 U.S.C. section 360bbb-3(b)(1), unless the authorization is terminated or revoked sooner.     Influenza A by PCR NEGATIVE NEGATIVE Final   Influenza B by PCR NEGATIVE NEGATIVE Final    Comment: (NOTE) The Xpert Xpress SARS-CoV-2/FLU/RSV assay is intended as an aid in  the diagnosis of influenza from Nasopharyngeal swab specimens and  should not be used as a sole basis for treatment. Nasal washings and  aspirates are unacceptable for Xpert Xpress SARS-CoV-2/FLU/RSV  testing.  Fact Sheet for Patients: PinkCheek.be  Fact Sheet for Healthcare Providers: GravelBags.it  This test is not yet approved or cleared by the [place] FDA and  has been authorized for detection and/or diagnosis of SARS-CoV-2 by  FDA under an Emergency Use Authorization (EUA). This EUA will remain  in effect (meaning this test can be used) for the duration of the  Covid-19 declaration under Section 564(b)(1) of the Act, 21  U.S.C. section 360bbb-3(b)(1), unless the authorization is  terminated or revoked. Performed at [facility], [address] [place], [place] [address]   Surgical PCR screen     Status: None   Collection Time: 03/09/20 11:31 AM   Specimen:  Nasal Mucosa; Nasal Swab  Result Value Ref Range Status   MRSA, PCR NEGATIVE NEGATIVE Final   Staphylococcus aureus NEGATIVE NEGATIVE Final    Comment: (NOTE) The Xpert SA Assay (FDA approved for NASAL specimens in patients 16 years of age and older), is one component of a comprehensive surveillance program. It is not intended to diagnose infection nor to guide or monitor treatment. Performed at [facility], [address] [place], [place] [address]      Radiology Studies: DG HIP OPERATIVE UNILAT WITH PELVIS RIGHT  Result Date: 03/10/2020 CLINICAL DATA:  Fracture fixation EXAM: OPERATIVE right HIP (WITH PELVIS IF PERFORMED) 8 VIEWS TECHNIQUE: Fluoroscopic spot image(s) were submitted for interpretation post-operatively. COMPARISON:  03/09/2020 FINDINGS: Right femoral neck fracture fixed with 4 screws across the fracture. Satisfactory fracture alignment. Satisfactory hardware placement. IMPRESSION: Screw fixation of right femoral neck fracture. Electronically Signed   By: Franchot Gallo M.D.   On: 03/10/2020 11:46   Scheduled Meds: . aspirin EC  325 mg Oral Q breakfast  . Chlorhexidine Gluconate Cloth  6 each Topical Daily  . diltiazem  240 mg Oral Daily  . docusate sodium  100 mg Oral BID  . hydrochlorothiazide  25 mg Oral Daily  . influenza vaccine adjuvanted  0.5 mL Intramuscular Tomorrow-1000  . insulin aspart  0-6 Units Subcutaneous TID WC  . lisinopril  40 mg Oral Daily  . pantoprazole  40 mg Oral Daily  . simvastatin  10 mg Oral QHS  . terazosin  5 mg Oral BID  . traMADol  50 mg Oral Q6H  . venlafaxine XR  75 mg Oral Daily   Continuous Infusions: . sodium chloride Stopped (03/10/20 0733)  . sodium chloride 75 mL/hr at 03/11/20 0357  . azithromycin 500 mg (03/11/20 0918)  . cefTRIAXone (ROCEPHIN)  IV 2 g (03/11/20 0630)  . methocarbamol (ROBAXIN) IV      LOS: 2 days   Time spent: 30 minutes   Marelyn Rouser Wynetta Emery, MD How to contact the [facility] Attending or Consulting  provider [place] or covering provider during after hours [place], for this patient?  1. Check the care team in [facility] and look for a) attending/consulting [facility] provider listed and b) the [facility] team listed 2.  Log into [email or web address] and use [place]'s universal password to access. If you do not have the password, please contact the hospital operator. 3. Locate the [facility] provider you are looking for under [facility] and page to a number that you can be directly reached. 4. If you still have difficulty reaching the provider, please page the [facility] (Director on Call) for the Hospitalists listed on amion for assistance.  03/11/2020, 12:45 PM

## 2020-03-11 NOTE — Evaluation (Signed)
Physical Therapy Evaluation Patient Details Name: KENDALL JUSTO MRN: [id number] DOB: [birth year] Today's Date: 03/11/2020   History of Present Illness  Jeffrey Sherman  is a 86 y.o. male, with history of peptic ulcer disease, hypertension, hard of hearing, hyperlipidemia, GERD, anxiety, and more presents to the ED with a chief complaint of fall and right hip pain.  Patient reports that at 7:30 PM he was walking to the kitchen when he tripped over the threshold in the doorway, and landed on his right hip.  Patient reports that he instantly felt sharp pain, especially when moving the extremity, but was trying not to come into the ER.  His family was unable to get him off of the commode around 12:30 AM so they called EMS.  In the interim they had given him hydrocodone, and a muscle relaxer, and neither had much of an effect on his pain.  He reports that at rest his pain is a 5-6 out of 10, and when he is moving it severe.  Patient reports that before his fall he had no lightheadedness, dizziness, chest pain, palpitations, shortness of breath.  After his fall he did have nausea that he attributes to the pain.  X-ray + for fx; an ORIF was completed on 03/10/2020.  The pt has no restrictions and is WBAT  Clinical Impression  PT is not oriented at this time and is total assist for all motion including ankle pumps.  PT will need SNF placement following acute stay.    Follow Up Recommendations SNF    Equipment Recommendations  None recommended by PT    Recommendations for Other Services       Precautions / Restrictions Precautions Precautions: Fall Restrictions Weight Bearing Restrictions: No      Mobility  Bed Mobility Overal bed mobility: Needs Assistance Bed Mobility: Supine to Sit     Supine to sit: +2 for safety/equipment        Transfers Overall transfer level:  (unable at this time)                       Pertinent Vitals/Pain Pain Assessment: Faces Faces Pain Scale:  Hurts even more Pain Descriptors / Indicators: Operative site guarding Pain Intervention(s): Limited activity within patient's tolerance    Home Living Family/patient expects to be discharged to:: Skilled nursing facility Living Arrangements: Children                    Prior Function Level of Independence: Independent               Hand Dominance        Extremity/Trunk Assessment   Upper Extremity Assessment Upper Extremity Assessment: Defer to OT evaluation    Lower Extremity Assessment Lower Extremity Assessment: Difficult to assess due to impaired cognition (All activity passive as pt would not assist)       Communication   Communication: No difficulties  Cognition Arousal/Alertness: Suspect due to medications Behavior During Therapy: WFL for tasks assessed/performed Overall Cognitive Status: Impaired/Different from baseline Area of Impairment: Orientation;Attention;Following commands;Awareness                 Orientation Level: Place;Time;Situation     Following Commands:  (not following commands)              General Comments      Exercises General Exercises - Lower Extremity Ankle Circles/Pumps: PROM;Both;10 reps Quad Sets: Other (comment) (attempted to get pt to complete quad  sets but was unable) Heel Slides: PROM;10 reps Hip ABduction/ADduction: PROM;10 reps   Assessment/Plan    PT Assessment Patient needs continued PT services  PT Problem List Decreased strength;Decreased range of motion;Decreased activity tolerance;Decreased balance;Pain       PT Treatment Interventions DME instruction;Gait training;Stair training;Functional mobility training;Therapeutic activities;Therapeutic exercise;Balance training    PT Goals (Current goals can be found in the Care Plan section)  Acute Rehab PT Goals Patient Stated Goal: To be able to walk again PT Goal Formulation: With patient/family Potential to Achieve Goals: Fair     Frequency Min 6X/week   Barriers to discharge        Co-evaluation               AM-PAC PT "6 Clicks" Mobility  Outcome Measure Help needed turning from your back to your side while in a flat bed without using bedrails?: Total Help needed moving from lying on your back to sitting on the side of a flat bed without using bedrails?: Total Help needed moving to and from a bed to a chair (including a wheelchair)?: Total Help needed standing up from a chair using your arms (e.g., wheelchair or bedside chair)?: Total Help needed to walk in hospital room?: Total Help needed climbing 3-5 steps with a railing? : Total 6 Click Score: 6    End of Session   Activity Tolerance: Patient limited by pain;Treatment limited secondary to agitation Patient left: in bed   PT Visit Diagnosis: Unsteadiness on feet (R26.81);Other abnormalities of gait and mobility (R26.89);Difficulty in walking, not elsewhere classified (R26.2)    Time: 1100-1140 PT Time Calculation (min) (ACUTE ONLY): 40 min   Charges:   PT Evaluation $PT Eval Low Complexity: 1 Low PT Treatments $Therapeutic Exercise: 8-22 mins         Rayetta Humphrey, PT CLT [phone] 03/11/2020, 12:34 PM

## 2020-03-11 NOTE — Progress Notes (Signed)
MD notified of current bladder scan 86ml. 500 ml bolus given at 1819. Pt had foley removed today and previous bladder scan per day shift report was also 0 ml prior to bolus.

## 2020-03-12 ENCOUNTER — Inpatient Hospital Stay (HOSPITAL_COMMUNITY): Payer: Medicare Other

## 2020-03-12 DIAGNOSIS — I10 Essential (primary) hypertension: Secondary | ICD-10-CM | POA: Diagnosis not present

## 2020-03-12 DIAGNOSIS — E7800 Pure hypercholesterolemia, unspecified: Secondary | ICD-10-CM | POA: Diagnosis not present

## 2020-03-12 DIAGNOSIS — S72001A Fracture of unspecified part of neck of right femur, initial encounter for closed fracture: Secondary | ICD-10-CM | POA: Diagnosis not present

## 2020-03-12 DIAGNOSIS — K219 Gastro-esophageal reflux disease without esophagitis: Secondary | ICD-10-CM | POA: Diagnosis not present

## 2020-03-12 LAB — GLUCOSE, CAPILLARY
Glucose-Capillary: 129 mg/dL — ABNORMAL HIGH (ref 70–99)
Glucose-Capillary: 146 mg/dL — ABNORMAL HIGH (ref 70–99)
Glucose-Capillary: 133 mg/dL — ABNORMAL HIGH (ref 70–99)
Glucose-Capillary: 150 mg/dL — ABNORMAL HIGH (ref 70–99)

## 2020-03-12 LAB — CBC
HCT: 29.6 % — ABNORMAL LOW (ref 39.0–52.0)
Hemoglobin: 9.3 g/dL — ABNORMAL LOW (ref 13.0–17.0)
MCH: 26.5 pg (ref 26.0–34.0)
MCHC: 31.4 g/dL (ref 30.0–36.0)
MCV: 84.3 fL (ref 80.0–100.0)
Platelets: 178 10*3/uL (ref 150–400)
RBC: 3.51 MIL/uL — ABNORMAL LOW (ref 4.22–5.81)
RDW: 15.8 % — ABNORMAL HIGH (ref 11.5–15.5)
WBC: 10.4 10*3/uL (ref 4.0–10.5)
nRBC: 0 % (ref 0.0–0.2)

## 2020-03-12 LAB — BASIC METABOLIC PANEL
Anion gap: 5 (ref 5–15)
BUN: 34 mg/dL — ABNORMAL HIGH (ref 8–23)
CO2: 27 mmol/L (ref 22–32)
Calcium: 8.3 mg/dL — ABNORMAL LOW (ref 8.9–10.3)
Chloride: 106 mmol/L (ref 98–111)
Creatinine, Ser: 1.77 mg/dL — ABNORMAL HIGH (ref 0.61–1.24)
GFR calc Af Amer: 39 mL/min — ABNORMAL LOW (ref >60)
GFR calc non Af Amer: 34 mL/min — ABNORMAL LOW (ref >60)
Glucose, Bld: 138 mg/dL — ABNORMAL HIGH (ref 70–99)
Potassium: 3.8 mmol/L (ref 3.5–5.1)
Sodium: 138 mmol/L (ref 135–145)

## 2020-03-12 MED ORDER — TRAMADOL HCL 50 MG PO TABS
25.0000 mg | ORAL_TABLET | Freq: Two times a day (BID) | ORAL | Status: DC | PRN
  Administered 2020-03-12: 25 mg via ORAL
  Filled 2020-03-12 (×2): qty 1

## 2020-03-12 MED ORDER — SODIUM CHLORIDE 0.9 % IV BOLUS
500.0000 mL | Freq: Once | INTRAVENOUS | Status: AC
  Administered 2020-03-12: 500 mL via INTRAVENOUS

## 2020-03-12 NOTE — Progress Notes (Signed)
Physical Therapy Treatment Patient Details Name: Jeffrey Sherman MRN: [id number] DOB: 11-Aug-1929 Today's Date: 03/12/2020    History of Present Illness Jeffrey Sherman  is a 86 y.o. male, with history of peptic ulcer disease, hypertension, hard of hearing, hyperlipidemia, GERD, anxiety, and more presents to the ED with a chief complaint of fall and right hip pain.  Patient reports that at 7:30 PM he was walking to the kitchen when he tripped over the threshold in the doorway, and landed on his right hip.  Patient reports that he instantly felt sharp pain, especially when moving the extremity, but was trying not to come into the ER.  His family was unable to get him off of the commode around 12:30 AM so they called EMS.  In the interim they had given him hydrocodone, and a muscle relaxer, and neither had much of an effect on his pain.  He reports that at rest his pain is a 5-6 out of 10, and when he is moving it severe.  Patient reports that before his fall he had no lightheadedness, dizziness, chest pain, palpitations, shortness of breath.  After his fall he did have nausea that he attributes to the pain.  X-ray + for fx; an ORIF was completed on 03/10/2020.  The pt has no restrictions and is WBAT    PT Comments    Therapist attempted to get pt to turn his head or use his UE but pt would not follow command for this activity. PT will respond to questions 50% of the time but will not answer appropriately.  Continue to follow pt to improve bed mobility and LE exercises to prevent stiffness as able.   Follow Up Recommendations  SNF     Equipment Recommendations  None recommended by PT    Recommendations for Other Services       Precautions / Restrictions Precautions Precautions: Fall Restrictions Weight Bearing Restrictions: Yes RUE Weight Bearing: Weight bearing as tolerated    Mobility  Bed Mobility Overal bed mobility: Needs Assistance Bed Mobility:  (unable to get pt to sitting ; pt  begins to resist therapist half way through)                       Exercises General Exercises - Lower Extremity Ankle Circles/Pumps: PROM;Both;10 reps Heel Slides: PROM;10 reps Hip ABduction/ADduction: PROM;10 reps    General Comments  All activity 100% therapist at this time.       Pertinent Vitals/Pain Pain Assessment: Faces Faces Pain Scale: Hurts even more Pain Descriptors / Indicators: Operative site guarding Pain Intervention(s): Limited activity within patient's tolerance    Home Living Family/patient expects to be discharged to:: Skilled nursing facility Living Arrangements: Children                  Prior Function Level of Independence: Independent          PT Goals (current goals can now be found in the care plan section)      Frequency    Min 6X/week      PT Plan  Continue to attempt to promote mobility        AM-PAC PT "6 Clicks" Mobility   Outcome Measure  Help needed turning from your back to your side while in a flat bed without using bedrails?: Total Help needed moving from lying on your back to sitting on the side of a flat bed without using bedrails?: Total Help needed moving to and from  a bed to a chair (including a wheelchair)?: Total Help needed standing up from a chair using your arms (e.g., wheelchair or bedside chair)?: Total Help needed to walk in hospital room?: Total Help needed climbing 3-5 steps with a railing? : Total 6 Click Score: 6    End of Session   Activity Tolerance: Patient limited by pain;Treatment limited secondary to agitation;Patient limited by lethargy Patient left: in bed   PT Visit Diagnosis: Unsteadiness on feet (R26.81);Other abnormalities of gait and mobility (R26.89);Difficulty in walking, not elsewhere classified (R26.2)     Time: 1030-1055 PT Time Calculation (min) (ACUTE ONLY): 25 min  Charges:  $Therapeutic Exercise: 23-37 mins                      Rayetta Humphrey, PT  CLT [phone] 03/12/2020, 11:06 AM

## 2020-03-12 NOTE — Progress Notes (Signed)
PROGRESS NOTE   Jeffrey Sherman  EUM:[id number] DOB: 06-03-1930 DOA: 03/09/2020 PCP: Celene Squibb, MD   Chief Complaint  Patient presents with  . Fall    Brief Admission History:   86 y.o. male, with history of peptic ulcer disease, hypertension, hard of hearing, hyperlipidemia, GERD, anxiety, and more presents to the ED with a chief complaint of fall and right hip pain.  Patient reports that at 7:30 PM he was walking to the kitchen when he tripped over the threshold in the doorway, and landed on his right hip.  Patient reports that he instantly felt sharp pain, especially when moving the extremity, but was trying not to come into the ER.  His family was unable to get him off of the commode around 12:30 AM so they called EMS.  In the interim they had given him hydrocodone, and a muscle relaxer, and neither had much of an effect on his pain.  He reports that at rest his pain is a 5-6 out of 10, and when he is moving it severe.  Patient reports that before his fall he had no lightheadedness, dizziness, chest pain, palpitations, shortness of breath.  After his fall he did have nausea that he attributes to the pain.  Patient reports that he has had a cough over the last week.  He went to PCP virtual visit and was given a Z-Pak.  This did not improve his cough.  Patient has no other acute complaints at this time.  Assessment & Plan:   Principal Problem:   Closed displaced fracture of right femoral neck (HCC) Active Problems:   Dyspnea   Essential hypertension   High cholesterol   Gastroesophageal reflux disease without esophagitis   Renal insufficiency  1. POD #2 s/p right hip fracture repair - per Dr. Aline Brochure, weightbearing as tolerated, no hip precautions, DVT prophylaxis for 35 days, staples to come out on postop day 14, first postop visit in 2 weeks.   2. Hypertension - pt has been restarted on his home meds and BP is better controlled.  3. AKI - prerenal - treating with IV fluid  hydration.  DC lisinopril.  DC HCTZ 4. Poor urinary output - I ordered STAT renal US and placed foley cath to monitor urine output better and rule out bladder outlet obstruction.  Tests have been unrevealing. No hydronephrosis.  Bladder empty.  Continue IV fluid hydration.  5. Abnormal CXR - repeated CXR 10/3 with no findings of pneumonia.  6. Acute blood loss anemia - likely from hip fracture and operative repair, following Hg, transfuse if needed, Hg on down to 9.3.  7. GERD - protonix ordered for GI protection.  8. Reactive leukocytosis - no s/s of infection found.  WBC down to 10.4.   DVT prophylaxis: aspirin  Code Status: full  Family Communication: updated at bedside Disposition: [facility]   Status is: Inpatient  Remains inpatient appropriate because:Inpatient level of care appropriate due to severity of illness.  Pt having very poor urinary output.    Dispo: The patient is from: Home              Anticipated d/c is to: SNF              Anticipated d/c date is: 2 days              Patient currently is not medically stable to d/c.  Consultants:   Orthopedics   Procedures:   Internal fixation right hip  Antimicrobials:    Subjective: Pt still having poor urine output.  Having right hip pain with movement.      Objective: Vitals:   03/11/20 1500 03/11/20 2050 03/12/20 0043 03/12/20 1443  BP: (!) 98/52 (!) 148/82 124/67 (!) 101/59  Pulse: 88 86 70 74  Resp: 18 19 19 18   Temp: 99 F (37.2 C) 98.5 F (36.9 C) 98 F (36.7 C)   TempSrc: Axillary Oral Oral   SpO2: 92% 100% 97% 96%  Weight:      Height:        Intake/Output Summary (Last 24 hours) at 03/12/2020 1641 Last data filed at 03/12/2020 1500 Gross per 24 hour  Intake 2981.41 ml  Output --  Net 2981.41 ml   Filed Weights   03/09/20 0206 03/10/20 0806  Weight: 90 kg 90 kg    Examination:  General exam: Appears calm and comfortable  Respiratory system: Clear to auscultation. Respiratory effort  normal. Cardiovascular system: S1 & S2 heard, RRR. No JVD, murmurs, rubs, gallops or clicks. No pedal edema. Gastrointestinal system: Abdomen is mildly distended, soft and light tenderness with deep palpation. No organomegaly or masses felt. Normal bowel sounds heard. Central nervous system: Alert and oriented. No focal neurological deficits. Extremities: wound clean and dry and intact. Good pedal pulses BLEs.  Skin: No rashes, lesions or ulcers Psychiatry: Judgement and insight appear normal. Mood & affect appropriate.   Data Reviewed: I have personally reviewed following labs and imaging studies  CBC: Recent Labs  Lab 03/09/20 0259 03/10/20 0328 03/11/20 0702 03/12/20 0215  WBC 13.7* 11.2* 12.0* 10.4  NEUTROABS 12.2* 9.0*  --   --   HGB 12.1* 11.3* 9.9* 9.3*  HCT 37.7* 35.9* 32.7* 29.6*  MCV 80.4 82.7 83.4 84.3  PLT 205 188 153 580    Basic Metabolic Panel: Recent Labs  Lab 03/09/20 0259 03/10/20 0328 03/11/20 0702 03/12/20 0215  NA 141 139 138 138  K 3.2* 3.3* 3.8 3.8  CL 100 101 102 106  CO2 31 30 29 27   GLUCOSE 208* 160* 169* 138*  BUN 21 18 24* 34*  CREATININE 1.50* 1.21 1.40* 1.77*  CALCIUM 9.2 8.8* 8.5* 8.3*  MG  --  2.3  --   --     GFR: Estimated Creatinine Clearance: 32 mL/min (A) (by C-G formula based on SCr of 1.77 mg/dL (H)).  Liver Function Tests: Recent Labs  Lab 03/10/20 0328  AST 17  ALT 16  ALKPHOS 46  BILITOT 0.7  PROT 5.6*  ALBUMIN 3.0*    CBG: Recent Labs  Lab 03/11/20 1612 03/11/20 2110 03/12/20 0732 03/12/20 1122 03/12/20 1633  GLUCAP 171* 156* 129* 146* 133*    Recent Results (from the past 240 hour(s))  Respiratory Panel by RT PCR (Flu A&B, Covid) - Nasopharyngeal Swab     Status: None   Collection Time: 03/09/20  3:55 AM   Specimen: Nasopharyngeal Swab  Result Value Ref Range Status   SARS Coronavirus 2 by RT PCR NEGATIVE NEGATIVE Final    Comment: (NOTE) SARS-CoV-2 target nucleic acids are NOT DETECTED.  The  SARS-CoV-2 RNA is generally detectable in upper respiratoy specimens during the acute phase of infection. The lowest concentration of SARS-CoV-2 viral copies this assay can detect is 131 copies/mL. A negative result does not preclude SARS-Cov-2 infection and should not be used as the sole basis for treatment or other patient management decisions. A negative result may occur with  improper specimen collection/handling, submission of specimen other than  nasopharyngeal swab, presence of viral mutation(s) within the areas targeted by this assay, and inadequate number of viral copies (<131 copies/mL). A negative result must be combined with clinical observations, patient history, and epidemiological information. The expected result is Negative.  Fact Sheet for Patients:  PinkCheek.be  Fact Sheet for Healthcare Providers:  GravelBags.it  This test is no t yet approved or cleared by the [place] FDA and  has been authorized for detection and/or diagnosis of SARS-CoV-2 by FDA under an Emergency Use Authorization (EUA). This EUA will remain  in effect (meaning this test can be used) for the duration of the COVID-19 declaration under Section 564(b)(1) of the Act, 21 U.S.C. section 360bbb-3(b)(1), unless the authorization is terminated or revoked sooner.     Influenza A by PCR NEGATIVE NEGATIVE Final   Influenza B by PCR NEGATIVE NEGATIVE Final    Comment: (NOTE) The Xpert Xpress SARS-CoV-2/FLU/RSV assay is intended as an aid in  the diagnosis of influenza from Nasopharyngeal swab specimens and  should not be used as a sole basis for treatment. Nasal washings and  aspirates are unacceptable for Xpert Xpress SARS-CoV-2/FLU/RSV  testing.  Fact Sheet for Patients: PinkCheek.be  Fact Sheet for Healthcare Providers: GravelBags.it  This test is not yet approved or cleared  by the [place] FDA and  has been authorized for detection and/or diagnosis of SARS-CoV-2 by  FDA under an Emergency Use Authorization (EUA). This EUA will remain  in effect (meaning this test can be used) for the duration of the  Covid-19 declaration under Section 564(b)(1) of the Act, 21  U.S.C. section 360bbb-3(b)(1), unless the authorization is  terminated or revoked. Performed at [facility], [address] [place], [place] [address]   Surgical PCR screen     Status: None   Collection Time: 03/09/20 11:31 AM   Specimen: Nasal Mucosa; Nasal Swab  Result Value Ref Range Status   MRSA, PCR NEGATIVE NEGATIVE Final   Staphylococcus aureus NEGATIVE NEGATIVE Final    Comment: (NOTE) The Xpert SA Assay (FDA approved for NASAL specimens in patients 24 years of age and older), is one component of a comprehensive surveillance program. It is not intended to diagnose infection nor to guide or monitor treatment. Performed at [facility], [address] [place], [place] [address]      Radiology Studies: US RENAL  Result Date: 03/12/2020 CLINICAL DATA:  Acute kidney injury EXAM: RENAL / URINARY TRACT ULTRASOUND COMPLETE COMPARISON:  Abdominal ultrasound 04/21/2019 FINDINGS: Right Kidney: Renal measurements: 12.5 x 5.0 x 4.3 cm = volume: 141 mL. Echogenicity within normal limits. Multiple cysts, largest measuring 6.6 cm. No solid mass. No hydronephrosis. Left Kidney: Renal measurements: 11.8 x 5.6 x 5.5 cm = volume: 189 mL. Echogenicity within normal limits. Large cyst measuring 6.1 cm. No solid mass. No hydronephrosis. Bladder: Decompressed. Other: None. IMPRESSION: No acute abnormality.  Bilateral renal cysts. Electronically Signed   By: Audie Pinto M.D.   On: 03/12/2020 12:06   DG CHEST PORT 1 VIEW  Result Date: 03/12/2020 CLINICAL DATA:  86 year old male with possible pneumonia. Negative for COVID-19 on 03/09/2020. EXAM: PORTABLE CHEST 1 VIEW COMPARISON:  Portable chest 03/09/2020  and earlier. FINDINGS: Portable AP upright view at 1259 hours. Lower lung volumes. Mediastinal contours are stable. Calcified aortic atherosclerosis. Visualized tracheal air column is within normal limits. Mild chronic increased pulmonary interstitial markings appear stable since 2019. No pneumothorax or pleural effusion. No confluent pulmonary opacity. No acute osseous abnormality identified. IMPRESSION: Lower lung  volumes. No acute cardiopulmonary abnormality. Electronically Signed   By: Genevie Ann M.D.   On: 03/12/2020 14:31   Scheduled Meds: . aspirin EC  325 mg Oral Q breakfast  . Chlorhexidine Gluconate Cloth  6 each Topical Daily  . diltiazem  240 mg Oral Daily  . docusate sodium  100 mg Oral BID  . influenza vaccine adjuvanted  0.5 mL Intramuscular Tomorrow-1000  . insulin aspart  0-6 Units Subcutaneous TID WC  . pantoprazole  40 mg Oral Daily  . simvastatin  10 mg Oral QHS  . terazosin  5 mg Oral BID  . venlafaxine XR  75 mg Oral Daily   Continuous Infusions: . sodium chloride 75 mL/hr at 03/12/20 0756  . azithromycin 500 mg (03/12/20 3762)  . cefTRIAXone (ROCEPHIN)  IV 2 g (03/12/20 0630)  . methocarbamol (ROBAXIN) IV      LOS: 3 days   Time spent: 30 minutes   Taylynn Easton Wynetta Emery, MD How to contact the [facility] Attending or Consulting provider [place] or covering provider during after hours [place], for this patient?  1. Check the care team in [facility] and look for a) attending/consulting TR[facility] provider listed and b) the [facility] team listed 2. Log into [email or web address] and use [place]'s universal password to access. If you do not have the password, please contact the hospital operator. 3. Locate the [facility] provider you are looking for under [facility] and page to a number that you can be directly reached. 4. If you still have difficulty reaching the provider, please page the [facility] (Director on Call) for the Hospitalists listed on amion for assistance.  03/12/2020, 4:41 PM

## 2020-03-12 NOTE — Progress Notes (Signed)
MD notified of current bladder scan 0 ml after 250 ml bolus was given

## 2020-03-12 NOTE — Progress Notes (Signed)
In and out cath ordered. MD notified of 5 ml return

## 2020-03-12 NOTE — Progress Notes (Signed)
Up trending Cr. UOP = 79ml in 7 hours. 587ml bolus, 24ml bolus given by previous providers. Another 589ml bolus given. Infusion rate increased to 156ml/hour for maintenance fluids. Stat repeat BMP ordered. Patient may need nephro consult in the AM.

## 2020-03-12 NOTE — Progress Notes (Signed)
MD notified of current bladder scan less than 50 ml

## 2020-03-13 ENCOUNTER — Encounter (HOSPITAL_COMMUNITY): Payer: Self-pay | Admitting: Orthopedic Surgery

## 2020-03-13 DIAGNOSIS — K219 Gastro-esophageal reflux disease without esophagitis: Secondary | ICD-10-CM | POA: Diagnosis not present

## 2020-03-13 DIAGNOSIS — I10 Essential (primary) hypertension: Secondary | ICD-10-CM | POA: Diagnosis not present

## 2020-03-13 DIAGNOSIS — E7800 Pure hypercholesterolemia, unspecified: Secondary | ICD-10-CM | POA: Diagnosis not present

## 2020-03-13 DIAGNOSIS — S72001A Fracture of unspecified part of neck of right femur, initial encounter for closed fracture: Secondary | ICD-10-CM | POA: Diagnosis not present

## 2020-03-13 LAB — BASIC METABOLIC PANEL
Anion gap: 8 (ref 5–15)
BUN: 43 mg/dL — ABNORMAL HIGH (ref 8–23)
CO2: 23 mmol/L (ref 22–32)
Calcium: 8.7 mg/dL — ABNORMAL LOW (ref 8.9–10.3)
Chloride: 106 mmol/L (ref 98–111)
Creatinine, Ser: 1.74 mg/dL — ABNORMAL HIGH (ref 0.61–1.24)
GFR calc Af Amer: 40 mL/min — ABNORMAL LOW (ref >60)
GFR calc non Af Amer: 34 mL/min — ABNORMAL LOW (ref >60)
Glucose, Bld: 144 mg/dL — ABNORMAL HIGH (ref 70–99)
Potassium: 3.6 mmol/L (ref 3.5–5.1)
Sodium: 137 mmol/L (ref 135–145)

## 2020-03-13 LAB — CBC
HCT: 29.5 % — ABNORMAL LOW (ref 39.0–52.0)
Hemoglobin: 9 g/dL — ABNORMAL LOW (ref 13.0–17.0)
MCH: 25.9 pg — ABNORMAL LOW (ref 26.0–34.0)
MCHC: 30.5 g/dL (ref 30.0–36.0)
MCV: 84.8 fL (ref 80.0–100.0)
Platelets: 159 10*3/uL (ref 150–400)
RBC: 3.48 MIL/uL — ABNORMAL LOW (ref 4.22–5.81)
RDW: 15.9 % — ABNORMAL HIGH (ref 11.5–15.5)
WBC: 8 10*3/uL (ref 4.0–10.5)
nRBC: 0 % (ref 0.0–0.2)

## 2020-03-13 LAB — GLUCOSE, CAPILLARY
Glucose-Capillary: 144 mg/dL — ABNORMAL HIGH (ref 70–99)
Glucose-Capillary: 120 mg/dL — ABNORMAL HIGH (ref 70–99)
Glucose-Capillary: 158 mg/dL — ABNORMAL HIGH (ref 70–99)
Glucose-Capillary: 147 mg/dL — ABNORMAL HIGH (ref 70–99)
Glucose-Capillary: 159 mg/dL — ABNORMAL HIGH (ref 70–99)

## 2020-03-13 LAB — PHOSPHORUS: Phosphorus: 3.3 mg/dL (ref 2.5–4.6)

## 2020-03-13 LAB — MAGNESIUM: Magnesium: 2.3 mg/dL (ref 1.7–2.4)

## 2020-03-13 LAB — SARS CORONAVIRUS 2 BY RT PCR (HOSPITAL ORDER, PERFORMED IN ~~LOC~~ HOSPITAL LAB): SARS Coronavirus 2: NEGATIVE

## 2020-03-13 MED ORDER — BENZONATATE 100 MG PO CAPS: 100.0000 mg | ORAL_CAPSULE | Freq: Three times a day (TID) | ORAL | Status: DC | PRN

## 2020-03-13 MED ORDER — METHOCARBAMOL 500 MG PO TABS: 250.0000 mg | ORAL_TABLET | Freq: Four times a day (QID) | ORAL | Status: DC | PRN

## 2020-03-13 MED ORDER — METHOCARBAMOL 1000 MG/10ML IJ SOLN
250.0000 mg | Freq: Four times a day (QID) | INTRAVENOUS | Status: DC | PRN
  Filled 2020-03-13: qty 2.5

## 2020-03-13 MED ORDER — LORAZEPAM 0.5 MG PO TABS
0.5000 mg | ORAL_TABLET | Freq: Four times a day (QID) | ORAL | Status: DC | PRN
  Administered 2020-03-13: 0.5 mg via ORAL
  Filled 2020-03-13: qty 1

## 2020-03-13 MED ORDER — HYDROCODONE-ACETAMINOPHEN 5-325 MG PO TABS: 1.0000 | ORAL_TABLET | Freq: Four times a day (QID) | ORAL | Status: DC | PRN

## 2020-03-13 MED ORDER — MORPHINE SULFATE (PF) 4 MG/ML IV SOLN
4.0000 mg | Freq: Once | INTRAVENOUS | Status: AC
  Administered 2020-03-13: 4 mg via INTRAVENOUS
  Filled 2020-03-13: qty 1

## 2020-03-13 MED ORDER — POLYETHYLENE GLYCOL 3350 17 G PO PACK
17.0000 g | PACK | Freq: Every day | ORAL | Status: DC
  Administered 2020-03-13 – 2020-03-14 (×2): 17 g via ORAL
  Filled 2020-03-13 (×2): qty 1

## 2020-03-13 MED ORDER — ACETAMINOPHEN 500 MG PO TABS
500.0000 mg | ORAL_TABLET | Freq: Four times a day (QID) | ORAL | Status: DC
  Administered 2020-03-13 – 2020-03-14 (×3): 500 mg via ORAL
  Filled 2020-03-13 (×4): qty 1

## 2020-03-13 MED ORDER — TRAMADOL HCL 50 MG PO TABS: 25.0000 mg | ORAL_TABLET | Freq: Two times a day (BID) | ORAL | Status: DC | PRN

## 2020-03-13 NOTE — Progress Notes (Signed)
Physical Therapy Treatment Patient Details Name: Jeffrey Sherman MRN: [id number] DOB: June 23, 1929 Today's Date: 03/13/2020    History of Present Illness Jeffrey Sherman  is a 86 y.o. male, with history of peptic ulcer disease, hypertension, hard of hearing, hyperlipidemia, GERD, anxiety, and more presents to the ED with a chief complaint of fall and right hip pain.  Patient reports that at 7:30 PM he was walking to the kitchen when he tripped over the threshold in the doorway, and landed on his right hip.  Patient reports that he instantly felt sharp pain, especially when moving the extremity, but was trying not to come into the ER.  His family was unable to get him off of the commode around 12:30 AM so they called EMS.  In the interim they had given him hydrocodone, and a muscle relaxer, and neither had much of an effect on his pain.  He reports that at rest his pain is a 5-6 out of 10, and when he is moving it severe.  Patient reports that before his fall he had no lightheadedness, dizziness, chest pain, palpitations, shortness of breath.  After his fall he did have nausea that he attributes to the pain.  X-ray + for fx; an ORIF was completed on 03/10/2020.  The pt has no restrictions and is WBAT    PT Comments    Patient apprehensive due to right hip pain, requires much time and Max assist to sit up at bedside requiring constant verbal/tactile cueing for proper hand placement, frequent leaning backward while seated at bedside, able to stand a couple of times for up to 20 seconds before having to sit due to fatigue.  Patient required Max assist for forward/laterally scooting at bedside and unable to transfer to chair or eat lunch seated at bedside due to constant leaning backwards and generalized weakness.  Patient required 2 person assist to reposition when put back bed.  Patient will benefit from continued physical therapy in hospital and recommended venue below to increase strength, balance, endurance  for safe ADLs and gait.   Follow Up Recommendations  SNF     Equipment Recommendations  None recommended by PT    Recommendations for Other Services       Precautions / Restrictions Precautions Precautions: Fall Restrictions Weight Bearing Restrictions: Yes RLE Weight Bearing: Weight bearing as tolerated    Mobility  Bed Mobility Overal bed mobility: Needs Assistance Bed Mobility: Supine to Sit;Sit to Supine     Supine to sit: Max assist Sit to supine: Max assist;+2 for physical assistance      Transfers Overall transfer level: Needs assistance Equipment used: Rolling walker (2 wheeled) Transfers: Sit to/from Stand Sit to Stand: Max assist;From elevated surface         General transfer comment: limited to standing for 10-20 second before having to sit due to weakness, right hip pain  Ambulation/Gait                 Stairs             Wheelchair Mobility    Modified Rankin (Stroke Patients Only)       Balance Overall balance assessment: Needs assistance Sitting-balance support: Feet supported;No upper extremity supported Sitting balance-Leahy Scale: Poor Sitting balance - Comments: fair/poor seated at EOB with frequent leaning backwards Postural control: Posterior lean Standing balance support: During functional activity;Bilateral upper extremity supported Standing balance-Leahy Scale: Poor Standing balance comment: using RW  Cognition Arousal/Alertness: Awake/alert Behavior During Therapy: WFL for tasks assessed/performed Overall Cognitive Status: Within Functional Limits for tasks assessed                                        Exercises General Exercises - Lower Extremity Ankle Circles/Pumps: AROM;Both;10 reps;Supine Heel Slides: Supine;10 reps;Both;AAROM;AROM    General Comments        Pertinent Vitals/Pain Pain Assessment: Faces Faces Pain Scale: Hurts even  more Pain Location: left hip Pain Descriptors / Indicators: Sore;Grimacing;Guarding Pain Intervention(s): Limited activity within patient's tolerance;Monitored during session    Home Living                      Prior Function            PT Goals (current goals can now be found in the care plan section) Acute Rehab PT Goals Patient Stated Goal: To be able to walk again PT Goal Formulation: With patient/family Potential to Achieve Goals: Good Progress towards PT goals: Progressing toward goals    Frequency    Min 4X/week      PT Plan Current plan remains appropriate    Co-evaluation              AM-PAC PT "6 Clicks" Mobility   Outcome Measure  Help needed turning from your back to your side while in a flat bed without using bedrails?: A Lot Help needed moving from lying on your back to sitting on the side of a flat bed without using bedrails?: A Lot Help needed moving to and from a bed to a chair (including a wheelchair)?: Total Help needed standing up from a chair using your arms (e.g., wheelchair or bedside chair)?: A Lot Help needed to walk in hospital room?: Total Help needed climbing 3-5 steps with a railing? : Total 6 Click Score: 9    End of Session Equipment Utilized During Treatment: Gait belt;Oxygen Activity Tolerance: Patient tolerated treatment well;Patient limited by fatigue Patient left: in bed;with call bell/phone within reach;with family/visitor present Nurse Communication: Mobility status PT Visit Diagnosis: Unsteadiness on feet (R26.81);Other abnormalities of gait and mobility (R26.89);Difficulty in walking, not elsewhere classified (R26.2);Muscle weakness (generalized) (M62.81);History of falling (Z91.81)     Time: 9774-1423 PT Time Calculation (min) (ACUTE ONLY): 34 min  Charges:  $Therapeutic Activity: 23-37 mins                     2:33 PM, 03/13/20 Lonell Grandchild, MPT Physical Therapist with [facility] 336 [phone] office [phone] mobile phone

## 2020-03-13 NOTE — NC FL2 (Signed)
[place] LEVEL OF CARE SCREENING TOOL     IDENTIFICATION  Patient Name: Jeffrey Sherman Birthdate: 02/13/1930 Sex: male Admission Date (Current Location): 03/09/2020  [facility] and [place] Number:  [employer] and Address:         Provider Number: [id number]  Attending Physician Name and Address:  Murlean Iba, MD  Relative Name and Phone Number:  Jeffrey Sherman [phone]    Current Level of Care: Hospital Recommended Level of Care: [place] Prior Approval Number:    Date Approved/Denied:   PASRR Number: [id number]  Discharge Plan: SNF    Current Diagnoses: Patient Active Problem List   Diagnosis Date Noted  . Closed displaced fracture of right femoral neck ([place]) 03/09/2020  . Infectious colitis   . Anemia   . Leukocytosis   . Rectal bleeding 11/06/2017  . Abdominal pain 11/06/2017  . Hypokalemia 11/06/2017  . Renal insufficiency 11/06/2017  . Hyperglycemia 11/06/2017  . Essential hypertension 06/20/2016  . High cholesterol 06/20/2016  . Gastroesophageal reflux disease without esophagitis 06/20/2016  . Dyspnea 12/17/2013  . Constipation 12/16/2011  . Wrist fracture 12/16/2011    Orientation RESPIRATION BLADDER Height & Weight     Self, Time, Situation, Place  O2 (2 L) Indwelling catheter Weight: 90 kg Height:  5\' 9"  (175.3 cm)  BEHAVIORAL SYMPTOMS/MOOD NEUROLOGICAL BOWEL NUTRITION STATUS      Continent Diet (See DC Summary)  AMBULATORY STATUS COMMUNICATION OF NEEDS Skin   Extensive Assist Verbally Surgical wounds (right hip)                       Personal Care Assistance Level of Assistance  Bathing, Feeding, Dressing Bathing Assistance: Maximum assistance Feeding assistance: Limited assistance Dressing Assistance: Maximum assistance     Functional Limitations Info  Sight, Hearing, Speech Sight Info: Adequate Hearing Info: Impaired Speech Info: Adequate    SPECIAL CARE FACTORS FREQUENCY   PT (By licensed PT)     PT Frequency: 5 times a week              Contractures Contractures Info: Not present    Additional Factors Info  Code Status, Allergies Code Status Info: Full Allergies Info: NKDA           Current Medications (03/13/2020):  This is the current hospital active medication list Current Facility-Administered Medications  Medication Dose Route Frequency Provider Last Rate Last Admin  . 0.9 %  sodium chloride infusion   Intravenous Continuous Wynetta Emery, Clanford L, MD 75 mL/hr at 03/12/20 0756 Rate Change at 03/12/20 0756  . aspirin EC tablet 325 mg  325 mg Oral Q breakfast Carole Civil, MD   325 mg at 03/13/20 0272  . benzonatate (TESSALON) capsule 100-200 mg  100-200 mg Oral Q8H PRN Carole Civil, MD      . calcium carbonate (TUMS - dosed in mg elemental calcium) chewable tablet 200 mg of elemental calcium  1 tablet Oral BID PRN Carole Civil, MD      . Chlorhexidine Gluconate Cloth 2 % PADS 6 each  6 each Topical Daily Carole Civil, MD   6 each at 03/11/20 0919  . diltiazem (CARDIZEM CD) 24 hr capsule 240 mg  240 mg Oral Daily Carole Civil, MD   240 mg at 03/13/20 5366  . docusate sodium (COLACE) capsule 100 mg  100 mg Oral BID Carole Civil, MD   100 mg at 03/13/20 4403  .  HYDROcodone-acetaminophen (NORCO/VICODIN) 5-325 MG per tablet 1-2 tablet  1-2 tablet Oral Q4H PRN Carole Civil, MD   1 tablet at 03/12/20 2347  . influenza vaccine adjuvanted (FLUAD) injection 0.5 mL  0.5 mL Intramuscular Tomorrow-1000 Johnson, Clanford L, MD      . insulin aspart (novoLOG) injection 0-6 Units  0-6 Units Subcutaneous TID WC Carole Civil, MD   1 Units at 03/11/20 1709  . labetalol (NORMODYNE) injection 10 mg  10 mg Intravenous Q2H PRN Carole Civil, MD      . LORazepam (ATIVAN) tablet 1 mg  1 mg Oral BID PRN Carole Civil, MD   1 mg at 03/11/20 0253  . menthol-cetylpyridinium (CEPACOL) lozenge 3 mg  1 lozenge  Oral PRN Carole Civil, MD       Or  . phenol (CHLORASEPTIC) mouth spray 1 spray  1 spray Mouth/Throat PRN Carole Civil, MD      . methocarbamol (ROBAXIN) tablet 500 mg  500 mg Oral Q6H PRN Carole Civil, MD   500 mg at 03/10/20 1409   Or  . methocarbamol (ROBAXIN) 500 mg in dextrose 5 % 50 mL IVPB  500 mg Intravenous Q6H PRN Carole Civil, MD      . metoCLOPramide (REGLAN) tablet 5-10 mg  5-10 mg Oral Q8H PRN Carole Civil, MD       Or  . metoCLOPramide (REGLAN) injection 5-10 mg  5-10 mg Intravenous Q8H PRN Carole Civil, MD      . ondansetron [facility]) injection 4 mg  4 mg Intravenous Q8H PRN Carole Civil, MD   4 mg at 03/10/20 0036   Or  . ondansetron (ZOFRAN-ODT) disintegrating tablet 4 mg  4 mg Oral Q8H PRN Carole Civil, MD      . oxybutynin (DITROPAN) tablet 5 mg  5 mg Oral Q8H PRN Johnson, Clanford L, MD      . pantoprazole (PROTONIX) EC tablet 40 mg  40 mg Oral Daily Carole Civil, MD   40 mg at 03/13/20 0907  . senna-docusate (Senokot-S) tablet 1 tablet  1 tablet Oral QHS PRN Carole Civil, MD      . simvastatin (ZOCOR) tablet 10 mg  10 mg Oral QHS Carole Civil, MD   10 mg at 03/12/20 2155  . terazosin (HYTRIN) capsule 5 mg  5 mg Oral BID Carole Civil, MD   5 mg at 03/13/20 8453  . traMADol (ULTRAM) tablet 25 mg  25 mg Oral Q12H PRN Johnson, Clanford L, MD   25 mg at 03/12/20 2200  . venlafaxine XR (EFFEXOR-XR) 24 hr capsule 75 mg  75 mg Oral Daily Carole Civil, MD   75 mg at 03/13/20 6468     Discharge Medications: Please see discharge summary for a list of discharge medications.  Relevant Imaging Results:  Relevant Lab Results:   Additional Information SSN#: [id number]  Boneta Lucks, RN

## 2020-03-13 NOTE — Progress Notes (Signed)
MD notified of pt moaning in pain and restless. PRN medications given at 2200 and 2347 with no relief

## 2020-03-13 NOTE — Progress Notes (Signed)
Patient ID: Jeffrey Sherman, male   DOB: [birth year], 86 y.o.   MRN: [id number] Surgery date October 1  This is postop day #3 status post internal fixation right hip with closed reduction of femoral neck fracture  BP 124/64 (BP Location: Right Arm)   Pulse 81   Temp 99.3 F (37.4 C) (Oral)   Resp 18   Ht 5\' 9"  (1.753 m)   Wt 90 kg   SpO2 97%   BMI 29.30 kg/m   Postop course has been marked by confusion, thought to be medication related and environmental change related  Patient is progressing slowly with physical therapy will need long-term rehab care.  Concern over swelling of the right thigh.  Right thigh examined dressing looks relatively clean thigh is soft there is no sign of compartment syndrome.  Patient had preop edema which was confirmed intraoperatively with a lot of extracellular fluid in the soft tissues  Leg alignment looks normal neurovascular exam is intact  Good with discharge when bed availabPostoperative plan  He can now be weightbearing as tolerated There are no hip precautions DVT prophylaxis for 35 days Staples come out on postop day 14 First postop visit postop day 14 gave or take 1 or 2 days    le

## 2020-03-13 NOTE — Progress Notes (Signed)
PROGRESS NOTE   Jeffrey Sherman  VZC:[id number] DOB: [birth year] DOA: 03/09/2020 PCP: Celene Squibb, MD   Chief Complaint  Patient presents with  . Fall    Brief Admission History:   86 y.o. male, with history of peptic ulcer disease, hypertension, hard of hearing, hyperlipidemia, GERD, anxiety, and more presents to the ED with a chief complaint of fall and right hip pain.  Patient reports that at 7:30 PM he was walking to the kitchen when he tripped over the threshold in the doorway, and landed on his right hip.  Patient reports that he instantly felt sharp pain, especially when moving the extremity, but was trying not to come into the ER.  His family was unable to get him off of the commode around 12:30 AM so they called EMS.  In the interim they had given him hydrocodone, and a muscle relaxer, and neither had much of an effect on his pain.  He reports that at rest his pain is a 5-6 out of 10, and when he is moving it severe.  Patient reports that before his fall he had no lightheadedness, dizziness, chest pain, palpitations, shortness of breath.  After his fall he did have nausea that he attributes to the pain.  Patient reports that he has had a cough over the last week.  He went to PCP virtual visit and was given a Z-Pak.  This did not improve his cough.  Patient has no other acute complaints at this time.  Assessment & Plan:   Principal Problem:   Closed displaced fracture of right femoral neck (HCC) Active Problems:   Dyspnea   Essential hypertension   High cholesterol   Gastroesophageal reflux disease without esophagitis   Renal insufficiency  1. POD #3 s/p right hip fracture repair - per Dr. Aline Brochure, weightbearing as tolerated, no hip precautions, DVT prophylaxis for 35 days, staples to come out on postop day 14, first postop visit in 2 weeks.   2. Hypertension - pt has been restarted on his home meds and BP is better controlled.  3. AKI - prerenal - treating with IV fluid  hydration.  DC lisinopril.  DC HCTZ.  Creatinine starting to plateau.  Hopefully will start improving in next 24 hours.  4. Poor urinary output - I ordered STAT renal US and placed foley cath to monitor urine output better and rule out bladder outlet obstruction.  Tests have been unrevealing. No hydronephrosis.  Bladder empty.  Continue IV fluid hydration.  5. Abnormal CXR - repeated CXR 10/3 with no findings of pneumonia.  6. Acute blood loss anemia - likely from hip fracture and operative repair, following Hg, transfuse if needed, Hg on down to 9.3.  7. GERD - protonix ordered for GI protection.  8. Reactive leukocytosis - no s/s of infection found.  WBC down to 10.4.   DVT prophylaxis: aspirin  Code Status: full  Family Communication: updated at bedside Disposition: [facility]   Status is: Inpatient  Remains inpatient appropriate because:Inpatient level of care appropriate due to severity of illness.  Pt having very poor urinary output.    Dispo: The patient is from: Home              Anticipated d/c is to: SNF              Anticipated d/c date is: 1 day              Patient currently is not medically stable to d/c.  Consultants:   Orthopedics   Procedures:   Internal fixation right hip   Antimicrobials:    Subjective: Pt eating a little better today.  He had right leg pain briefly overnight treated and better this morning.      Objective: Vitals:   03/12/20 1443 03/12/20 2139 03/13/20 0624 03/13/20 1601  BP: (!) 101/59 137/62 124/64 137/72  Pulse: 74 83 81 85  Resp: 18   18  Temp:  98.7 F (37.1 C) 99.3 F (37.4 C)   TempSrc:  Oral Oral   SpO2: 96% 97% 97% 94%  Weight:      Height:        Intake/Output Summary (Last 24 hours) at 03/13/2020 1618 Last data filed at 03/13/2020 0700 Gross per 24 hour  Intake --  Output 800 ml  Net -800 ml   Filed Weights   03/09/20 0206 03/10/20 0806  Weight: 90 kg 90 kg    Examination:  General exam: Appears calm and  comfortable  Respiratory system: Clear to auscultation. Respiratory effort normal. Cardiovascular system: S1 & S2 heard, RRR. No JVD, murmurs, rubs, gallops or clicks. No pedal edema. Gastrointestinal system: Abdomen is mildly distended, soft and light tenderness with deep palpation. No organomegaly or masses felt. Normal bowel sounds heard. Central nervous system: Alert and oriented. No focal neurological deficits. Extremities: wound clean and dry and intact. Good pedal pulses BLEs.  Skin: No rashes, lesions or ulcers Psychiatry: Judgement and insight appear normal. Mood & affect appropriate.   Data Reviewed: I have personally reviewed following labs and imaging studies  CBC: Recent Labs  Lab 03/09/20 0259 03/10/20 0328 03/11/20 0702 03/12/20 0215 03/13/20 0552  WBC 13.7* 11.2* 12.0* 10.4 8.0  NEUTROABS 12.2* 9.0*  --   --   --   HGB 12.1* 11.3* 9.9* 9.3* 9.0*  HCT 37.7* 35.9* 32.7* 29.6* 29.5*  MCV 80.4 82.7 83.4 84.3 84.8  PLT 205 188 153 178 865    Basic Metabolic Panel: Recent Labs  Lab 03/09/20 0259 03/10/20 0328 03/11/20 0702 03/12/20 0215 03/13/20 0552  NA 141 139 138 138 137  K 3.2* 3.3* 3.8 3.8 3.6  CL 100 101 102 106 106  CO2 31 30 29 27 23   GLUCOSE 208* 160* 169* 138* 144*  BUN 21 18 24* 34* 43*  CREATININE 1.50* 1.21 1.40* 1.77* 1.74*  CALCIUM 9.2 8.8* 8.5* 8.3* 8.7*  MG  --  2.3  --   --  2.3  PHOS  --   --   --   --  3.3    GFR: Estimated Creatinine Clearance: 32.5 mL/min (A) (by C-G formula based on SCr of 1.74 mg/dL (H)).  Liver Function Tests: Recent Labs  Lab 03/10/20 0328  AST 17  ALT 16  ALKPHOS 46  BILITOT 0.7  PROT 5.6*  ALBUMIN 3.0*    CBG: Recent Labs  Lab 03/12/20 2140 03/13/20 0256 03/13/20 0800 03/13/20 1114 03/13/20 1609  GLUCAP 150* 144* 120* 158* 147*    Recent Results (from the past 240 hour(s))  Respiratory Panel by RT PCR (Flu A&B, Covid) - Nasopharyngeal Swab     Status: None   Collection Time: 03/09/20   3:55 AM   Specimen: Nasopharyngeal Swab  Result Value Ref Range Status   SARS Coronavirus 2 by RT PCR NEGATIVE NEGATIVE Final    Comment: (NOTE) SARS-CoV-2 target nucleic acids are NOT DETECTED.  The SARS-CoV-2 RNA is generally detectable in upper respiratoy specimens during the acute phase of infection. The  lowest concentration of SARS-CoV-2 viral copies this assay can detect is 131 copies/mL. A negative result does not preclude SARS-Cov-2 infection and should not be used as the sole basis for treatment or other patient management decisions. A negative result may occur with  improper specimen collection/handling, submission of specimen other than nasopharyngeal swab, presence of viral mutation(s) within the areas targeted by this assay, and inadequate number of viral copies (<131 copies/mL). A negative result must be combined with clinical observations, patient history, and epidemiological information. The expected result is Negative.  Fact Sheet for Patients:  PinkCheek.be  Fact Sheet for Healthcare Providers:  GravelBags.it  This test is no t yet approved or cleared by the [place] FDA and  has been authorized for detection and/or diagnosis of SARS-CoV-2 by FDA under an Emergency Use Authorization (EUA). This EUA will remain  in effect (meaning this test can be used) for the duration of the COVID-19 declaration under Section 564(b)(1) of the Act, 21 U.S.C. section 360bbb-3(b)(1), unless the authorization is terminated or revoked sooner.     Influenza A by PCR NEGATIVE NEGATIVE Final   Influenza B by PCR NEGATIVE NEGATIVE Final    Comment: (NOTE) The Xpert Xpress SARS-CoV-2/FLU/RSV assay is intended as an aid in  the diagnosis of influenza from Nasopharyngeal swab specimens and  should not be used as a sole basis for treatment. Nasal washings and  aspirates are unacceptable for Xpert Xpress SARS-CoV-2/FLU/RSV   testing.  Fact Sheet for Patients: PinkCheek.be  Fact Sheet for Healthcare Providers: GravelBags.it  This test is not yet approved or cleared by the [place] FDA and  has been authorized for detection and/or diagnosis of SARS-CoV-2 by  FDA under an Emergency Use Authorization (EUA). This EUA will remain  in effect (meaning this test can be used) for the duration of the  Covid-19 declaration under Section 564(b)(1) of the Act, 21  U.S.C. section 360bbb-3(b)(1), unless the authorization is  terminated or revoked. Performed at [facility], [address] [place], [place] [address]   Surgical PCR screen     Status: None   Collection Time: 03/09/20 11:31 AM   Specimen: Nasal Mucosa; Nasal Swab  Result Value Ref Range Status   MRSA, PCR NEGATIVE NEGATIVE Final   Staphylococcus aureus NEGATIVE NEGATIVE Final    Comment: (NOTE) The Xpert SA Assay (FDA approved for NASAL specimens in patients 65 years of age and older), is one component of a comprehensive surveillance program. It is not intended to diagnose infection nor to guide or monitor treatment. Performed at [facility], [address], [place],  [address]   SARS Coronavirus 2 by RT PCR (hospital order, performed in [facility] hospital lab) Nasopharyngeal Nasopharyngeal Swab     Status: None   Collection Time: 03/13/20 10:59 AM   Specimen: Nasopharyngeal Swab  Result Value Ref Range Status   SARS Coronavirus 2 NEGATIVE NEGATIVE Final    Comment: (NOTE) SARS-CoV-2 target nucleic acids are NOT DETECTED.  The SARS-CoV-2 RNA is generally detectable in upper and lower respiratory specimens during the acute phase of infection. The lowest concentration of SARS-CoV-2 viral copies this assay can detect is 250 copies / mL. A negative result does not preclude SARS-CoV-2 infection and should not be used as the sole basis for treatment or other patient management  decisions.  A negative result may occur with improper specimen collection / handling, submission of specimen other than nasopharyngeal swab, presence of viral mutation(s) within the areas targeted by this assay,  and inadequate number of viral copies (<250 copies / mL). A negative result must be combined with clinical observations, patient history, and epidemiological information.  Fact Sheet for Patients:   StrictlyIdeas.no  Fact Sheet for Healthcare Providers: BankingDealers.co.za  This test is not yet approved or  cleared by the [place] FDA and has been authorized for detection and/or diagnosis of SARS-CoV-2 by FDA under an Emergency Use Authorization (EUA).  This EUA will remain in effect (meaning this test can be used) for the duration of the COVID-19 declaration under Section 564(b)(1) of the Act, 21 U.S.C. section 360bbb-3(b)(1), unless the authorization is terminated or revoked sooner.  Performed at [facility], [address], [place], [place] [address]      Radiology Studies: US RENAL  Result Date: 03/12/2020 CLINICAL DATA:  Acute kidney injury EXAM: RENAL / URINARY TRACT ULTRASOUND COMPLETE COMPARISON:  Abdominal ultrasound 04/21/2019 FINDINGS: Right Kidney: Renal measurements: 12.5 x 5.0 x 4.3 cm = volume: 141 mL. Echogenicity within normal limits. Multiple cysts, largest measuring 6.6 cm. No solid mass. No hydronephrosis. Left Kidney: Renal measurements: 11.8 x 5.6 x 5.5 cm = volume: 189 mL. Echogenicity within normal limits. Large cyst measuring 6.1 cm. No solid mass. No hydronephrosis. Bladder: Decompressed. Other: None. IMPRESSION: No acute abnormality.  Bilateral renal cysts. Electronically Signed   By: Audie Pinto M.D.   On: 03/12/2020 12:06   DG CHEST PORT 1 VIEW  Result Date: 03/12/2020 CLINICAL DATA:  86 year old male with possible pneumonia. Negative for COVID-19 on 03/09/2020. EXAM: PORTABLE CHEST 1 VIEW  COMPARISON:  Portable chest 03/09/2020 and earlier. FINDINGS: Portable AP upright view at 1259 hours. Lower lung volumes. Mediastinal contours are stable. Calcified aortic atherosclerosis. Visualized tracheal air column is within normal limits. Mild chronic increased pulmonary interstitial markings appear stable since 2019. No pneumothorax or pleural effusion. No confluent pulmonary opacity. No acute osseous abnormality identified. IMPRESSION: Lower lung volumes. No acute cardiopulmonary abnormality. Electronically Signed   By: Genevie Ann M.D.   On: 03/12/2020 14:31   Scheduled Meds: . acetaminophen  500 mg Oral Q6H  . aspirin EC  325 mg Oral Q breakfast  . Chlorhexidine Gluconate Cloth  6 each Topical Daily  . diltiazem  240 mg Oral Daily  . docusate sodium  100 mg Oral BID  . influenza vaccine adjuvanted  0.5 mL Intramuscular Tomorrow-1000  . insulin aspart  0-6 Units Subcutaneous TID WC  . pantoprazole  40 mg Oral Daily  . simvastatin  10 mg Oral QHS  . terazosin  5 mg Oral BID  . venlafaxine XR  75 mg Oral Daily   Continuous Infusions: . sodium chloride 75 mL/hr at 03/12/20 0756  . methocarbamol (ROBAXIN) IV      LOS: 4 days   Time spent: 30 minutes   Naoko Diperna Wynetta Emery, MD How to contact the [facility] Attending or Consulting provider [place] or covering provider during after hours [place], for this patient?  1. Check the care team in [facility] and look for a) attending/consulting [facility] provider listed and b) the [facility] team listed 2. Log into [email or web address] and use [place]'s universal password to access. If you do not have the password, please contact the hospital operator. 3. Locate the [facility] provider you are looking for under [facility] and page to a number that you can be directly reached. 4. If you still have difficulty reaching the provider, please page the [facility] (Director on Call) for the Hospitalists listed on amion for assistance.  03/13/2020, 4:18 PM

## 2020-03-13 NOTE — Care Management Important Message (Signed)
Important Message  Patient Details  Name: Jeffrey Sherman MRN: [id number] Date of Birth: 27-May-1930   Medicare Important Message Given:  Yes     Tommy Medal 03/13/2020, 11:48 AM

## 2020-03-14 ENCOUNTER — Non-Acute Institutional Stay (SKILLED_NURSING_FACILITY): Payer: Medicare Other | Admitting: Adult Health

## 2020-03-14 ENCOUNTER — Encounter: Payer: Self-pay | Admitting: Adult Health

## 2020-03-14 ENCOUNTER — Inpatient Hospital Stay
Admission: RE | Admit: 2020-03-14 | Discharge: 2020-05-22 | Disposition: A | Discharge status: Skilled Nursing Facility | Payer: Medicare Other | Source: Ambulatory Visit | Attending: Internal Medicine | Admitting: Internal Medicine

## 2020-03-14 DIAGNOSIS — S72001A Fracture of unspecified part of neck of right femur, initial encounter for closed fracture: Secondary | ICD-10-CM

## 2020-03-14 DIAGNOSIS — D62 Acute posthemorrhagic anemia: Secondary | ICD-10-CM | POA: Diagnosis not present

## 2020-03-14 DIAGNOSIS — R0600 Dyspnea, unspecified: Secondary | ICD-10-CM

## 2020-03-14 DIAGNOSIS — E43 Unspecified severe protein-calorie malnutrition: Secondary | ICD-10-CM | POA: Diagnosis not present

## 2020-03-14 DIAGNOSIS — Z9181 History of falling: Secondary | ICD-10-CM | POA: Diagnosis not present

## 2020-03-14 DIAGNOSIS — C61 Malignant neoplasm of prostate: Secondary | ICD-10-CM | POA: Diagnosis not present

## 2020-03-14 DIAGNOSIS — N3281 Overactive bladder: Secondary | ICD-10-CM | POA: Diagnosis not present

## 2020-03-14 DIAGNOSIS — M6281 Muscle weakness (generalized): Secondary | ICD-10-CM | POA: Diagnosis not present

## 2020-03-14 DIAGNOSIS — H9192 Unspecified hearing loss, left ear: Secondary | ICD-10-CM | POA: Diagnosis not present

## 2020-03-14 DIAGNOSIS — K5909 Other constipation: Secondary | ICD-10-CM | POA: Diagnosis not present

## 2020-03-14 DIAGNOSIS — N289 Disorder of kidney and ureter, unspecified: Secondary | ICD-10-CM

## 2020-03-14 DIAGNOSIS — F339 Major depressive disorder, recurrent, unspecified: Secondary | ICD-10-CM | POA: Diagnosis not present

## 2020-03-14 DIAGNOSIS — F0390 Unspecified dementia without behavioral disturbance: Secondary | ICD-10-CM | POA: Diagnosis not present

## 2020-03-14 DIAGNOSIS — Z4789 Encounter for other orthopedic aftercare: Secondary | ICD-10-CM | POA: Diagnosis not present

## 2020-03-14 DIAGNOSIS — E876 Hypokalemia: Secondary | ICD-10-CM | POA: Diagnosis not present

## 2020-03-14 DIAGNOSIS — S72001D Fracture of unspecified part of neck of right femur, subsequent encounter for closed fracture with routine healing: Secondary | ICD-10-CM | POA: Diagnosis not present

## 2020-03-14 DIAGNOSIS — E785 Hyperlipidemia, unspecified: Secondary | ICD-10-CM | POA: Diagnosis not present

## 2020-03-14 DIAGNOSIS — I10 Essential (primary) hypertension: Secondary | ICD-10-CM | POA: Diagnosis not present

## 2020-03-14 DIAGNOSIS — K219 Gastro-esophageal reflux disease without esophagitis: Secondary | ICD-10-CM | POA: Diagnosis not present

## 2020-03-14 DIAGNOSIS — R488 Other symbolic dysfunctions: Secondary | ICD-10-CM | POA: Diagnosis not present

## 2020-03-14 DIAGNOSIS — F411 Generalized anxiety disorder: Secondary | ICD-10-CM | POA: Diagnosis not present

## 2020-03-14 DIAGNOSIS — D508 Other iron deficiency anemias: Secondary | ICD-10-CM | POA: Diagnosis not present

## 2020-03-14 DIAGNOSIS — R262 Difficulty in walking, not elsewhere classified: Secondary | ICD-10-CM | POA: Diagnosis not present

## 2020-03-14 DIAGNOSIS — J849 Interstitial pulmonary disease, unspecified: Secondary | ICD-10-CM | POA: Diagnosis not present

## 2020-03-14 DIAGNOSIS — Z741 Need for assistance with personal care: Secondary | ICD-10-CM | POA: Diagnosis not present

## 2020-03-14 DIAGNOSIS — R011 Cardiac murmur, unspecified: Secondary | ICD-10-CM | POA: Diagnosis not present

## 2020-03-14 DIAGNOSIS — Z8711 Personal history of peptic ulcer disease: Secondary | ICD-10-CM | POA: Diagnosis not present

## 2020-03-14 DIAGNOSIS — S72041D Displaced fracture of base of neck of right femur, subsequent encounter for closed fracture with routine healing: Secondary | ICD-10-CM | POA: Diagnosis present

## 2020-03-14 DIAGNOSIS — X58XXXD Exposure to other specified factors, subsequent encounter: Secondary | ICD-10-CM | POA: Diagnosis not present

## 2020-03-14 DIAGNOSIS — R079 Chest pain, unspecified: Secondary | ICD-10-CM | POA: Diagnosis not present

## 2020-03-14 DIAGNOSIS — Z23 Encounter for immunization: Secondary | ICD-10-CM | POA: Diagnosis not present

## 2020-03-14 DIAGNOSIS — E7800 Pure hypercholesterolemia, unspecified: Secondary | ICD-10-CM | POA: Diagnosis not present

## 2020-03-14 LAB — MAGNESIUM: Magnesium: 2.1 mg/dL (ref 1.7–2.4)

## 2020-03-14 LAB — BASIC METABOLIC PANEL
Anion gap: 7 (ref 5–15)
BUN: 36 mg/dL — ABNORMAL HIGH (ref 8–23)
CO2: 25 mmol/L (ref 22–32)
Calcium: 8.8 mg/dL — ABNORMAL LOW (ref 8.9–10.3)
Chloride: 107 mmol/L (ref 98–111)
Creatinine, Ser: 1.23 mg/dL (ref 0.61–1.24)
GFR calc Af Amer: >60 mL/min (ref >60)
GFR calc non Af Amer: 52 mL/min — ABNORMAL LOW (ref >60)
Glucose, Bld: 134 mg/dL — ABNORMAL HIGH (ref 70–99)
Potassium: 3.2 mmol/L — ABNORMAL LOW (ref 3.5–5.1)
Sodium: 139 mmol/L (ref 135–145)

## 2020-03-14 LAB — GLUCOSE, CAPILLARY
Glucose-Capillary: 143 mg/dL — ABNORMAL HIGH (ref 70–99)
Glucose-Capillary: 170 mg/dL — ABNORMAL HIGH (ref 70–99)

## 2020-03-14 MED ORDER — ASPIRIN 325 MG PO TBEC: DELAYED_RELEASE_TABLET | ORAL | 0 refills | Status: DC

## 2020-03-14 MED ORDER — FLEET ENEMA 7-19 GM/118ML RE ENEM
1.0000 | ENEMA | Freq: Once | RECTAL | Status: AC
  Administered 2020-03-14: 1 via RECTAL

## 2020-03-14 MED ORDER — POLYETHYLENE GLYCOL 3350 17 G PO PACK: 17.0000 g | PACK | ORAL | 0 refills | Status: DC

## 2020-03-14 MED ORDER — LISINOPRIL 40 MG PO TABS: 40.0000 mg | ORAL_TABLET | Freq: Every day | ORAL | Status: DC

## 2020-03-14 MED ORDER — SENNOSIDES-DOCUSATE SODIUM 8.6-50 MG PO TABS: 1.0000 | ORAL_TABLET | Freq: Every evening | ORAL | Status: DC | PRN

## 2020-03-14 MED ORDER — ACETAMINOPHEN 500 MG PO TABS
500.0000 mg | ORAL_TABLET | Freq: Three times a day (TID) | ORAL | 0 refills | Status: DC
Start: 2020-03-14 — End: 2020-03-29

## 2020-03-14 MED ORDER — DILTIAZEM HCL ER COATED BEADS 240 MG PO CP24: 240.0000 mg | ORAL_CAPSULE | Freq: Every day | ORAL | Status: DC

## 2020-03-14 MED ORDER — POTASSIUM CHLORIDE 20 MEQ/15ML (10%) PO SOLN
60.0000 meq | Freq: Every day | ORAL | Status: DC
  Administered 2020-03-14: 60 meq via ORAL
  Filled 2020-03-14: qty 60

## 2020-03-14 MED ORDER — OMEPRAZOLE 20 MG PO CPDR
20.0000 mg | DELAYED_RELEASE_CAPSULE | Freq: Every day | ORAL | 5 refills | Status: AC
Start: 2020-03-14 — End: ?

## 2020-03-14 NOTE — Progress Notes (Signed)
Location:    [facility] Room Number: 143/P Place of Service:  SNF (31)   CODE STATUS: Full Code  No Known Allergies  Chief Complaint  Patient presents with  . Hospitalization Follow-up    Hospitalization Follow Up    HPI:  He is a 86 year old man who has been hospitalized from 03-09-20 through 03-14-20. He had a mechanical fall at home and suffered a right hip fracture. He underwent a right hip internal fixation on 03-10-20. He is now requiring 02 which will need to be weaned off of. His k+ remains low at 3.2. he will need supplementation. His cxr does demonstrate some interstitial lung disease. His goal is to return back home. He will continue to be followed for his chronic illnesses including: hypertension; depression; prostate cancer.   Past Medical History:  Diagnosis Date  . Anxiety   . Cancer [facility])    prostate  . Chronic chest wall pain   . Essential hypertension, benign 06/20/2016  . GERD (gastroesophageal reflux disease)   . H/O echocardiogram 2016   normal  . Heart murmur    "leaking heart valve"  . High cholesterol 06/20/2016  . HOH (hard of hearing)    left  . Poor historian   . PUD (peptic ulcer disease)   . Shortness of breath     Past Surgical History:  Procedure Laterality Date  . ESOPHAGOGASTRODUODENOSCOPY    . ESOPHAGOGASTRODUODENOSCOPY N/A 07/18/2016   Procedure: ESOPHAGOGASTRODUODENOSCOPY (EGD);  Surgeon: Rogene Houston, MD;  Location: [facility] ENDO SUITE;  Service: Endoscopy;  Laterality: N/A;  3:00-moved to 230 per Lelon Frohlich  . HIP PINNING,CANNULATED Right 03/10/2020   Procedure: INTERNAL FIXATION RIGHT HIP;  Surgeon: Carole Civil, MD;  Location: [facility] ORS;  Service: Orthopedics;  Laterality: Right;  . ORIF WRIST FRACTURE  12/19/2011   Procedure: OPEN REDUCTION INTERNAL FIXATION (ORIF) WRIST FRACTURE;  Surgeon: Carole Civil, MD;  Location: [facility] ORS;  Service: Orthopedics;  Laterality: Right;  . prostate cancer     Diagnosed in the 90s   . TONSILLECTOMY      Social History   Socioeconomic History  . Marital status: Widowed    Spouse name: Not on file  . Number of children: Not on file  . Years of education: Not on file  . Highest education level: Not on file  Occupational History  . Occupation: retired    Fish farm manager: RETIRED  Tobacco Use  . Smoking status: Never Smoker  . Smokeless tobacco: Never Used  Substance and Sexual Activity  . Alcohol use: No  . Drug use: No  . Sexual activity: Not on file  Other Topics Concern  . Not on file  Social History Narrative  . Not on file   Social Determinants of Health   Financial Resource Strain:   . Difficulty of Paying Living Expenses: Not on file  Food Insecurity:   . Worried About Charity fundraiser in the Last Year: Not on file  . Ran Out of Food in the Last Year: Not on file  Transportation Needs:   . Lack of Transportation (Medical): Not on file  . Lack of Transportation (Non-Medical): Not on file  Physical Activity:   . Days of Exercise per Week: Not on file  . Minutes of Exercise per Session: Not on file  Stress:   . Feeling of Stress : Not on file  Social Connections:   . Frequency of Communication with Friends and Family: Not on file  .  Frequency of Social Gatherings with Friends and Family: Not on file  . Attends Religious Services: Not on file  . Active Member of Clubs or Organizations: Not on file  . Attends Archivist Meetings: Not on file  . Marital Status: Not on file  Intimate Partner Violence:   . Fear of Current or Ex-Partner: Not on file  . Emotionally Abused: Not on file  . Physically Abused: Not on file  . Sexually Abused: Not on file   Family History  Problem Relation Age of Onset  . Cancer Other   . Heart attack Mother   . Cancer Brother   . Heart attack Brother   . Heart disease Sister        by pass      VITAL SIGNS BP 138/89   Temp 98.3 F (36.8 C)   Resp 20   Ht 5\' 9"  (1.753 m)   Wt 223 lb 9.6 oz  (101.4 kg)   SpO2 97%   BMI 33.02 kg/m   Outpatient Encounter Medications as of 03/14/2020  Medication Sig  . acetaminophen (TYLENOL) 500 MG tablet Take 1 tablet (500 mg total) by mouth 3 (three) times daily.  Marland Kitchen alfuzosin (UROXATRAL) 10 MG 24 hr tablet Take 10 mg by mouth daily with breakfast.  . aspirin EC 325 MG EC tablet Take 325 mg daily thru 11/5 then resume 81 mg daily  . calcium carbonate (TUMS - DOSED IN MG ELEMENTAL CALCIUM) 500 MG chewable tablet Chew 1 tablet by mouth 2 (two) times daily as needed for indigestion or heartburn.   . diltiazem (CARDIZEM CD) 240 MG 24 hr capsule Take 1 capsule (240 mg total) by mouth daily.  . Flax Oil-Fish Oil-Borage Oil (FISH OIL-FLAX OIL-BORAGE OIL) CAPS Take 1 capsule by mouth daily.  Marland Kitchen lisinopril (ZESTRIL) 40 MG tablet Take 1 tablet (40 mg total) by mouth daily.  . Melatonin 10 MG TABS Take 10 mg by mouth at bedtime.  . NON FORMULARY Diet - Mechanical Soft  . omeprazole (PRILOSEC) 20 MG capsule Take 1 capsule (20 mg total) by mouth daily before breakfast.  . OXYGEN Inhale 2 L into the lungs continuous.  . polyethylene glycol (MIRALAX / GLYCOLAX) 17 g packet Take 17 g by mouth every other day.  . senna-docusate (SENOKOT-S) 8.6-50 MG tablet Take 1 tablet by mouth at bedtime as needed for mild constipation.  . simvastatin (ZOCOR) 10 MG tablet Take 1 tablet (10 mg total) by mouth at bedtime.  Marland Kitchen terazosin (HYTRIN) 5 MG capsule Take 5 mg by mouth daily.   Marland Kitchen venlafaxine XR (EFFEXOR-XR) 75 MG 24 hr capsule Take 75 mg by mouth daily.  . vitamin C (ASCORBIC ACID) 500 MG tablet Take 1,000 mg by mouth daily.   No facility-administered encounter medications on file as of 03/14/2020.     SIGNIFICANT DIAGNOSTIC EXAMS  TODAY  03-09-20: right hip x-ray: Minimally displaced right femoral neck fracture.  03-09-20: chest x-ray: Peribronchial thickening with streaky perihilar infiltrates suggesting bronchitis or multifocal pneumonia.  03-12-20: renal  ultrasound: No acute abnormality. Bilateral renal cysts  03-12-20: chest x-ray: Lower lung volumes. No acute cardiopulmonary abnormality.   LABS REVIEWED TODAY;   03-09-20: wbc 13.7; hgb 12.1; hct 37.7; mcv 80.4 plt 205; glucose 208; bun 21; creat 1.50; k+ 3.2; na++ 141; ca 9.2; hgb a1c 5.9; HIV: nr 03-10-20: wbc 11.2; hgb 11.3; hct 35.9; mcv 82.7 plt 188; glucose 160; bun 18; creat 1.21; k+ 3.3; na++ 139; ca 8.8 liver normal  3.0; mag 2.3 03-12-20: wbc 10.4; hgb 9.3; hct 29.6; mcv 84.3 plt 178; glucose 138; bun 34; creat 1.77; k+ 3.8; na++ 138; ca 8.3 03-14-20: glucose 134; bun 36; creat 1.23; k+ 3.2; na++ 139; ca 8.8 mag 2.1      Review of Systems  Reason unable to perform ROS: confusion     Physical Exam Constitutional:      General: He is not in acute distress.    Appearance: He is well-developed. He is obese. He is not diaphoretic.  HENT:     Ears:     Comments: HOH L > R  Neck:     Thyroid: No thyromegaly.  Cardiovascular:     Rate and Rhythm: Normal rate and regular rhythm.     Heart sounds: Normal heart sounds.  Pulmonary:     Effort: Pulmonary effort is normal. No respiratory distress.     Breath sounds: Normal breath sounds.     Comments: 02 Abdominal:     General: Bowel sounds are normal. There is distension.     Palpations: Abdomen is soft.     Tenderness: There is no abdominal tenderness.     Comments: Tympanic to percussion   Musculoskeletal:     Cervical back: Neck supple.     Right lower leg: No edema.     Left lower leg: No edema.     Comments: Is able to move all extremities  Status post right hip internal fixation 03-10-20  Lymphadenopathy:     Cervical: No cervical adenopathy.  Skin:    General: Skin is warm and dry.     Comments: Staples intact   Neurological:     Mental Status: He is alert. Mental status is at baseline.  Psychiatric:        Mood and Affect: Mood normal.        ASSESSMENT/ PLAN:  1. Closed displaced of right femoral neck : is  stable is status post right internal fixation 03-10-20. Will continue therapy as directed and will follow up with orthopedics; will continue tylenol 500 mg three times daily asa 325 mg daily through 04-14-20   2. Essential hypertension: is stable b/p 138/89 will continue lisinopril 40 mg daily cardizem cd 240 mg daily hytrin 5 mg daily (may need to stop this medication due to his recent fracture)  3. Gastroesophageal reflux disease without esophagitis: is stable will continue prilosec 20 mg daily   4. Prostate cancer/ overactive bladder: will continue hytrin 5 mg daily; urosatral 10 mg daily have stopped oxybutrin due to his recent fracture.   5. Renal insufficiency: is stable bun 36; creat 1.23   6. Hypokalemia: is worse k+ 3.2 will give k+ 40 meq today will repeat labs.   7. Dyslipidemia: is stable will continue zocor 10 mg daily and fish oil 1 gm daily   8. Chronic constipation: is worse: will change to miralax 17 gm daily; and will continue senna s daily as needed  9. Major depression recurrent chronic: is stable will continue effexor xr 75 mg daily take melatonin 10 mg nightly for sleep.   10. Acute blood loss as cause of postoperative anemia: hgb is declining current hgb 9.3 will continue to monitor his status.   11. Interstitial lung disease; is without change; now requires 02. Will attempt to wean off 02 as tolerated.    Will check cbc; iron studies bmp     MD is aware of resident's narcotic use and is in agreement with current plan  of care. We will attempt to wean resident as appropriate.  Ok Edwards NP [facility]  Contact [phone] Monday through Friday 8am- 5pm  After hours call [phone]

## 2020-03-14 NOTE — Discharge Instructions (Signed)
Follow up with Dr. Aline Brochure in 10 days for staple removal and postop check up.   Continue ASPIRIN for DVT prophylaxis thru 04/14/20 and then stop the 325 mg dose and resume 81 mg daily.    Weightbearing as tolerated.  There are no hip precautions.     IMPORTANT INFORMATION: PAY CLOSE ATTENTION   PHYSICIAN DISCHARGE INSTRUCTIONS  Follow with Primary care provider  Celene Squibb, MD  and other consultants as instructed by your Hospitalist Physician  [place] IF SYMPTOMS COME BACK, WORSEN OR NEW PROBLEM DEVELOPS   Please note: You were cared for by a hospitalist during your hospital stay. Every effort will be made to forward records to your primary care provider.  You can request that your primary care provider send for your hospital records if they have not received them.  Once you are discharged, your primary care physician will handle any further medical issues. Please note that NO REFILLS for any discharge medications will be authorized once you are discharged, as it is imperative that you return to your primary care physician (or establish a relationship with a primary care physician if you do not have one) for your post hospital discharge needs so that they can reassess your need for medications and monitor your lab values.  Please get a complete blood count and chemistry panel checked by your Primary MD at your next visit, and again as instructed by your Primary MD.  Get Medicines reviewed and adjusted: Please take all your medications with you for your next visit with your Primary MD  Laboratory/radiological data: Please request your Primary MD to go over all hospital tests and procedure/radiological results at the follow up, please ask your primary care provider to get all Hospital records sent to his/her office.  In some cases, they will be blood work, cultures and biopsy results pending at the time of your discharge. Please request that your  primary care provider follow up on these results.  If you are diabetic, please bring your blood sugar readings with you to your follow up appointment with primary care.    Please call and make your follow up appointments as soon as possible.    Also Note the following: If you experience worsening of your admission symptoms, develop shortness of breath, life threatening emergency, suicidal or homicidal thoughts you must seek medical attention immediately by calling 911 or calling your MD immediately  if symptoms less severe.  You must read complete instructions/literature along with all the possible adverse reactions/side effects for all the Medicines you take and that have been prescribed to you. Take any new Medicines after you have completely understood and accpet all the possible adverse reactions/side effects.   Do not drive when taking Pain medications or sleeping medications (Benzodiazepines)  Do not take more than prescribed Pain, Sleep and Anxiety Medications. It is not advisable to combine anxiety,sleep and pain medications without talking with your primary care practitioner  Special Instructions: If you have smoked or chewed Tobacco  in the last 2 yrs please stop smoking, stop any regular Alcohol  and or any Recreational drug use.  Wear Seat belts while driving.  Do not drive if taking any narcotic, mind altering or controlled substances or recreational drugs or alcohol.

## 2020-03-14 NOTE — Plan of Care (Signed)

## 2020-03-14 NOTE — Progress Notes (Signed)
Patient was given Fleet enema @ 1045.  Patient had medium-sized, soft BM @ 1136.

## 2020-03-14 NOTE — Discharge Summary (Signed)
Physician Discharge Summary  Jeffrey Sherman WNU:[id number] DOB: 05/04/1930 DOA: 03/09/2020  PCP: Jeffrey Squibb, MD Orthopedics: Dr. Aline Brochure  Admit date: 03/09/2020 Discharge date: 03/14/2020  Disposition:  SNF   Recommendations for Outpatient Follow-up:  1. Follow up with Dr. Aline Brochure in 10 days for suture removal and postop check 2. Suture Removal in 10 days.  3. Continue aspirin 325 mg daily thru 11/5, then resume Aspirin 81 mg daily   Discharge Condition: STABLE   CODE STATUS: FULL    Brief Hospitalization Summary: Please see all hospital notes, images, labs for full details of the hospitalization. ADMISSION HPI:  Jeffrey Sherman  is a 86 y.o. male, with history of peptic ulcer disease, hypertension, hard of hearing, hyperlipidemia, GERD, anxiety, and more presents to the ED with a chief complaint of fall and right hip pain.  Patient reports that at 7:30 PM he was walking to the kitchen when he tripped over the threshold in the doorway, and landed on his right hip.  Patient reports that he instantly felt sharp pain, especially when moving the extremity, but was trying not to come into the ER.  His family was unable to get him off of the commode around 12:30 AM so they called EMS.  In the interim they had given him hydrocodone, and a muscle relaxer, and neither had much of an effect on his pain.  He reports that at rest his pain is a 5-6 out of 10, and when he is moving it severe.  Patient reports that before his fall he had no lightheadedness, dizziness, chest pain, palpitations, shortness of breath.  After his fall he did have nausea that he attributes to the pain.  Patient reports that he has had a cough over the last week.  He went to PCP virtual visit and was given a Z-Pak.  This did not improve his cough.  Patient has no other acute complaints at this time.  In the ED Temperature 97.9, heart rate 58, respiratory rate 17, blood pressure 169/74 White blood cell count 13.7, hemoglobin  12.1 Hypokalemia at 3.2 AKI with a creatinine of 1.50 EKG shows heart rate 53, sinus bradycardia, QTC 461 Respiratory panel pending X-ray hip shows minimally displaced right femoral neck fracture Chest x-ray shows peribronchial thickening with streaky perihilar infiltrates suggesting bronchitis or multifocal pneumonia 2 L nasal cannula applied Fentanyl given for pain   1. POD #4 s/p right hip fracture repair - per Dr. Aline Brochure, weightbearing as tolerated, no hip precautions, DVT prophylaxis for 35 days, staples to come out on postop day 14, first postop visit in 2 weeks.   2. Hypertension - pt has been restarted on his home meds.  3. AKI - prerenal -RESOLVED.  Treated with IV fluid hydration.  DC HCTZ.  Creatinine much improved.   4. Poor urinary output - IMPROVED.  renal US normal, no bladder outlet obstruction found.  Tests have been unrevealing. No hydronephrosis.  Bladder empty.  5. Abnormal CXR - repeated CXR 10/3 with no findings of pneumonia.  6. Acute blood loss anemia - likely from hip fracture and operative repair, following Hg, transfuse if needed, Hg on down to 9.3.  7. GERD - protonix ordered for GI protection.  8. Reactive leukocytosis - no s/s of infection found.  WBC down to 10.4.   DVT prophylaxis: aspirin  Code Status: full  Family Communication: updated at bedside Disposition: [facility]   Discharge Diagnoses:  Principal Problem:   Closed displaced fracture of right femoral  neck [facility]) Active Problems:   Dyspnea   Essential hypertension   High cholesterol   Gastroesophageal reflux disease without esophagitis   Renal insufficiency  Discharge Instructions:  Allergies as of 03/14/2020   No Known Allergies     Medication List    STOP taking these medications   benzonatate 100 MG capsule Commonly known as: TESSALON   hydrochlorothiazide 25 MG tablet Commonly known as: HYDRODIURIL   HYDROcodone-acetaminophen 5-325 MG tablet Commonly known as:  NORCO/VICODIN   LORazepam 1 MG tablet Commonly known as: ATIVAN   potassium chloride SA 20 MEQ tablet Commonly known as: KLOR-CON     TAKE these medications   acetaminophen 500 MG tablet Commonly known as: TYLENOL Take 1 tablet (500 mg total) by mouth 3 (three) times daily.   alfuzosin 10 MG 24 hr tablet Commonly known as: UROXATRAL Take 10 mg by mouth daily with breakfast.   aspirin 325 MG EC tablet Take 325 mg daily thru 11/5 then resume 81 mg daily What changed:   medication strength  how much to take  how to take this  when to take this  additional instructions   calcium carbonate 500 MG chewable tablet Commonly known as: TUMS - dosed in mg elemental calcium Chew 1 tablet by mouth 2 (two) times daily as needed for indigestion or heartburn.   diltiazem 240 MG 24 hr capsule Commonly known as: CARDIZEM CD Take 1 capsule (240 mg total) by mouth daily.   Fish Oil-Flax Oil-Borage Oil Caps Take 1 capsule by mouth daily.   lisinopril 40 MG tablet Commonly known as: ZESTRIL Take 1 tablet (40 mg total) by mouth daily. What changed: when to take this   Melatonin 10 MG Tabs Take 10 mg by mouth at bedtime.   omeprazole 20 MG capsule Commonly known as: PRILOSEC Take 1 capsule (20 mg total) by mouth daily before breakfast.   oxybutynin 5 MG tablet Commonly known as: DITROPAN Take 1 tablet (5 mg total) by mouth every 8 (eight) hours as needed (urinary frequency/urgency).   polyethylene glycol 17 g packet Commonly known as: MIRALAX / GLYCOLAX Take 17 g by mouth every other day.   senna-docusate 8.6-50 MG tablet Commonly known as: Senokot-S Take 1 tablet by mouth at bedtime as needed for mild constipation.   simvastatin 10 MG tablet Commonly known as: ZOCOR Take 1 tablet (10 mg total) by mouth at bedtime.   terazosin 5 MG capsule Commonly known as: HYTRIN Take 5 mg by mouth daily.   venlafaxine XR 75 MG 24 hr capsule Commonly known as: EFFEXOR-XR Take 75  mg by mouth daily.   vitamin C 500 MG tablet Commonly known as: ASCORBIC ACID Take 1,000 mg by mouth daily.       Contact information for follow-up providers    Carole Civil, MD. Schedule an appointment as soon as possible for a visit in 10 day(s).   Specialties: Orthopedic Surgery, Radiology Why: Hospital Follow Up and staple removal  Contact information: [id number] [place] [address] [phone]            Contact information for after-discharge care    [facility] .   Service: Skilled Nursing Contact information: [address] [place] [phone]                 No Known Allergies Allergies as of 03/14/2020   No Known Allergies     Medication List  STOP taking these medications   benzonatate 100 MG capsule Commonly known as: TESSALON   hydrochlorothiazide 25 MG tablet Commonly known as: HYDRODIURIL   HYDROcodone-acetaminophen 5-325 MG tablet Commonly known as: NORCO/VICODIN   LORazepam 1 MG tablet Commonly known as: ATIVAN   potassium chloride SA 20 MEQ tablet Commonly known as: KLOR-CON     TAKE these medications   acetaminophen 500 MG tablet Commonly known as: TYLENOL Take 1 tablet (500 mg total) by mouth 3 (three) times daily.   alfuzosin 10 MG 24 hr tablet Commonly known as: UROXATRAL Take 10 mg by mouth daily with breakfast.   aspirin 325 MG EC tablet Take 325 mg daily thru 11/5 then resume 81 mg daily What changed:   medication strength  how much to take  how to take this  when to take this  additional instructions   calcium carbonate 500 MG chewable tablet Commonly known as: TUMS - dosed in mg elemental calcium Chew 1 tablet by mouth 2 (two) times daily as needed for indigestion or heartburn.   diltiazem 240 MG 24 hr capsule Commonly known as: CARDIZEM CD Take 1 capsule (240 mg total) by mouth daily.   Fish Oil-Flax  Oil-Borage Oil Caps Take 1 capsule by mouth daily.   lisinopril 40 MG tablet Commonly known as: ZESTRIL Take 1 tablet (40 mg total) by mouth daily. What changed: when to take this   Melatonin 10 MG Tabs Take 10 mg by mouth at bedtime.   omeprazole 20 MG capsule Commonly known as: PRILOSEC Take 1 capsule (20 mg total) by mouth daily before breakfast.   oxybutynin 5 MG tablet Commonly known as: DITROPAN Take 1 tablet (5 mg total) by mouth every 8 (eight) hours as needed (urinary frequency/urgency).   polyethylene glycol 17 g packet Commonly known as: MIRALAX / GLYCOLAX Take 17 g by mouth every other day.   senna-docusate 8.6-50 MG tablet Commonly known as: Senokot-S Take 1 tablet by mouth at bedtime as needed for mild constipation.   simvastatin 10 MG tablet Commonly known as: ZOCOR Take 1 tablet (10 mg total) by mouth at bedtime.   terazosin 5 MG capsule Commonly known as: HYTRIN Take 5 mg by mouth daily.   venlafaxine XR 75 MG 24 hr capsule Commonly known as: EFFEXOR-XR Take 75 mg by mouth daily.   vitamin C 500 MG tablet Commonly known as: ASCORBIC ACID Take 1,000 mg by mouth daily.       Procedures/Studies: Lumbar Spine 2-3 Views  Result Date: 02/16/2020 CLINICAL DATA:  Low back pain for 1 week, no known injury, initial encounter EXAM: LUMBAR SPINE - 2-3 VIEW COMPARISON:  None. FINDINGS: Five lumbar type vertebral bodies are well visualized. Vertebral body height is well maintained. Disc space narrowing is noted at L4-5. Mild osteophytic changes are seen. Facet hypertrophic changes are noted as well. No soft tissue abnormality is noted. Vascular calcifications are seen. IMPRESSION: Degenerative change without acute abnormality. Electronically Signed   By: Inez Catalina M.D.   On: 02/16/2020 14:59   US RENAL  Result Date: 03/12/2020 CLINICAL DATA:  Acute kidney injury EXAM: RENAL / URINARY TRACT ULTRASOUND COMPLETE COMPARISON:  Abdominal ultrasound 04/21/2019  FINDINGS: Right Kidney: Renal measurements: 12.5 x 5.0 x 4.3 cm = volume: 141 mL. Echogenicity within normal limits. Multiple cysts, largest measuring 6.6 cm. No solid mass. No hydronephrosis. Left Kidney: Renal measurements: 11.8 x 5.6 x 5.5 cm = volume: 189 mL. Echogenicity within normal limits. Large cyst measuring 6.1 cm. No  solid mass. No hydronephrosis. Bladder: Decompressed. Other: None. IMPRESSION: No acute abnormality.  Bilateral renal cysts. Electronically Signed   By: Audie Pinto M.D.   On: 03/12/2020 12:06   DG CHEST PORT 1 VIEW  Result Date: 03/12/2020 CLINICAL DATA:  86 year old male with possible pneumonia. Negative for COVID-19 on 03/09/2020. EXAM: PORTABLE CHEST 1 VIEW COMPARISON:  Portable chest 03/09/2020 and earlier. FINDINGS: Portable AP upright view at 1259 hours. Lower lung volumes. Mediastinal contours are stable. Calcified aortic atherosclerosis. Visualized tracheal air column is within normal limits. Mild chronic increased pulmonary interstitial markings appear stable since 2019. No pneumothorax or pleural effusion. No confluent pulmonary opacity. No acute osseous abnormality identified. IMPRESSION: Lower lung volumes. No acute cardiopulmonary abnormality. Electronically Signed   By: Genevie Ann M.D.   On: 03/12/2020 14:31   DG Chest Port 1 View  Result Date: 03/09/2020 CLINICAL DATA:  Right hip pain after a fall. History of prostate cancer. EXAM: PORTABLE CHEST 1 VIEW COMPARISON:  06/06/2018 FINDINGS: Heart size and pulmonary vascularity are normal. Peribronchial thickening with streaky perihilar infiltrates. This could represent bronchitis or multifocal pneumonia. No focal consolidation. No pleural effusions. No pneumothorax. Mediastinal contours appear intact. Calcified and tortuous aorta. Degenerative changes in the spine and shoulders. IMPRESSION: Peribronchial thickening with streaky perihilar infiltrates suggesting bronchitis or multifocal pneumonia. Electronically Signed    By: Lucienne Capers M.D.   On: 03/09/2020 02:47   DG HIP OPERATIVE UNILAT WITH PELVIS RIGHT  Result Date: 03/10/2020 CLINICAL DATA:  Fracture fixation EXAM: OPERATIVE right HIP (WITH PELVIS IF PERFORMED) 8 VIEWS TECHNIQUE: Fluoroscopic spot image(s) were submitted for interpretation post-operatively. COMPARISON:  03/09/2020 FINDINGS: Right femoral neck fracture fixed with 4 screws across the fracture. Satisfactory fracture alignment. Satisfactory hardware placement. IMPRESSION: Screw fixation of right femoral neck fracture. Electronically Signed   By: Franchot Gallo M.D.   On: 03/10/2020 11:46   DG Hip Unilat W or Wo Pelvis 2-3 Views Right  Result Date: 03/09/2020 CLINICAL DATA:  Right hip pain after fall EXAM: DG HIP (WITH OR WITHOUT PELVIS) 2-3V RIGHT COMPARISON:  None. FINDINGS: There is a minimally displaced fracture of the right femoral neck with mild shortening. No dislocation. No other pelvic fracture. Left hip normal. IMPRESSION: Minimally displaced right femoral neck fracture. Electronically Signed   By: Ulyses Jarred M.D.   On: 03/09/2020 02:45      Subjective: Pt without complaints.   Discharge Exam: Vitals:   03/13/20 2050 03/14/20 0450  BP: (!) 151/88 (!) 196/90  Pulse: 92 86  Resp: 20 20  Temp: 99.1 F (37.3 C) 99.2 F (37.3 C)  SpO2: 95% 97%   Vitals:   03/13/20 0624 03/13/20 1601 03/13/20 2050 03/14/20 0450  BP: 124/64 137/72 (!) 151/88 (!) 196/90  Pulse: 81 85 92 86  Resp:  18 20 20   Temp: 99.3 F (37.4 C)  99.1 F (37.3 C) 99.2 F (37.3 C)  TempSrc: Oral  Oral Oral  SpO2: 97% 94% 95% 97%  Weight:      Height:       General exam: Appears calm and comfortable  Respiratory system: Clear to auscultation. Respiratory effort normal. Cardiovascular system: S1 & S2 heard. No JVD, murmurs, rubs, gallops or clicks. No pedal edema. Gastrointestinal system: Abdomen is mildly distended, soft and light tenderness with deep palpation. No organomegaly or masses felt.  Normal bowel sounds heard. Central nervous system: Alert and oriented. No focal neurological deficits. Extremities: wound clean and dry and intact. Good pedal pulses BLEs.  Skin: No rashes, lesions or ulcers.  Psychiatry:  Mood & affect appropriate.    The results of significant diagnostics from this hospitalization (including imaging, microbiology, ancillary and laboratory) are listed below for reference.     Microbiology: Recent Results (from the past 240 hour(s))  Respiratory Panel by RT PCR (Flu A&B, Covid) - Nasopharyngeal Swab     Status: None   Collection Time: 03/09/20  3:55 AM   Specimen: Nasopharyngeal Swab  Result Value Ref Range Status   SARS Coronavirus 2 by RT PCR NEGATIVE NEGATIVE Final    Comment: (NOTE) SARS-CoV-2 target nucleic acids are NOT DETECTED.  The SARS-CoV-2 RNA is generally detectable in upper respiratoy specimens during the acute phase of infection. The lowest concentration of SARS-CoV-2 viral copies this assay can detect is 131 copies/mL. A negative result does not preclude SARS-Cov-2 infection and should not be used as the sole basis for treatment or other patient management decisions. A negative result may occur with  improper specimen collection/handling, submission of specimen other than nasopharyngeal swab, presence of viral mutation(s) within the areas targeted by this assay, and inadequate number of viral copies (<131 copies/mL). A negative result must be combined with clinical observations, patient history, and epidemiological information. The expected result is Negative.  Fact Sheet for Patients:  PinkCheek.be  Fact Sheet for Healthcare Providers:  GravelBags.it  This test is no t yet approved or cleared by the [place] FDA and  has been authorized for detection and/or diagnosis of SARS-CoV-2 by FDA under an Emergency Use Authorization (EUA). This EUA will remain  in effect  (meaning this test can be used) for the duration of the COVID-19 declaration under Section 564(b)(1) of the Act, 21 U.S.C. section 360bbb-3(b)(1), unless the authorization is terminated or revoked sooner.     Influenza A by PCR NEGATIVE NEGATIVE Final   Influenza B by PCR NEGATIVE NEGATIVE Final    Comment: (NOTE) The Xpert Xpress SARS-CoV-2/FLU/RSV assay is intended as an aid in  the diagnosis of influenza from Nasopharyngeal swab specimens and  should not be used as a sole basis for treatment. Nasal washings and  aspirates are unacceptable for Xpert Xpress SARS-CoV-2/FLU/RSV  testing.  Fact Sheet for Patients: PinkCheek.be  Fact Sheet for Healthcare Providers: GravelBags.it  This test is not yet approved or cleared by the [place] FDA and  has been authorized for detection and/or diagnosis of SARS-CoV-2 by  FDA under an Emergency Use Authorization (EUA). This EUA will remain  in effect (meaning this test can be used) for the duration of the  Covid-19 declaration under Section 564(b)(1) of the Act, 21  U.S.C. section 360bbb-3(b)(1), unless the authorization is  terminated or revoked. Performed at [facility], [address], [place], [place] [address]   Surgical PCR screen     Status: None   Collection Time: 03/09/20 11:31 AM   Specimen: Nasal Mucosa; Nasal Swab  Result Value Ref Range Status   MRSA, PCR NEGATIVE NEGATIVE Final   Staphylococcus aureus NEGATIVE NEGATIVE Final    Comment: (NOTE) The Xpert SA Assay (FDA approved for NASAL specimens in patients 107 years of age and older), is one component of a comprehensive surveillance program. It is not intended to diagnose infection nor to guide or monitor treatment. Performed at [facility], [address], [place], [place] [address]   SARS Coronavirus 2 by RT PCR (hospital order, performed in [facility] hospital lab) Nasopharyngeal Nasopharyngeal Swab      Status: None  Collection Time: 03/13/20 10:59 AM   Specimen: Nasopharyngeal Swab  Result Value Ref Range Status   SARS Coronavirus 2 NEGATIVE NEGATIVE Final    Comment: (NOTE) SARS-CoV-2 target nucleic acids are NOT DETECTED.  The SARS-CoV-2 RNA is generally detectable in upper and lower respiratory specimens during the acute phase of infection. The lowest concentration of SARS-CoV-2 viral copies this assay can detect is 250 copies / mL. A negative result does not preclude SARS-CoV-2 infection and should not be used as the sole basis for treatment or other patient management decisions.  A negative result may occur with improper specimen collection / handling, submission of specimen other than nasopharyngeal swab, presence of viral mutation(s) within the areas targeted by this assay, and inadequate number of viral copies (<250 copies / mL). A negative result must be combined with clinical observations, patient history, and epidemiological information.  Fact Sheet for Patients:   StrictlyIdeas.no  Fact Sheet for Healthcare Providers: BankingDealers.co.za  This test is not yet approved or  cleared by the [place] FDA and has been authorized for detection and/or diagnosis of SARS-CoV-2 by FDA under an Emergency Use Authorization (EUA).  This EUA will remain in effect (meaning this test can be used) for the duration of the COVID-19 declaration under Section 564(b)(1) of the Act, 21 U.S.C. section 360bbb-3(b)(1), unless the authorization is terminated or revoked sooner.  Performed at [facility], [address] [place], [place] [address]      Labs: BNP (last 3 results) No results for input(s): BNP in the last 8760 hours. Basic Metabolic Panel: Recent Labs  Lab 03/10/20 0328 03/11/20 0702 03/12/20 0215 03/13/20 0552 03/14/20 0630  NA 139 138 138 137 139  K 3.3* 3.8 3.8 3.6 3.2*  CL 101 102 106 106 107  CO2 30 29 27 23  25   GLUCOSE 160* 169* 138* 144* 134*  BUN 18 24* 34* 43* 36*  CREATININE 1.21 1.40* 1.77* 1.74* 1.23  CALCIUM 8.8* 8.5* 8.3* 8.7* 8.8*  MG 2.3  --   --  2.3 2.1  PHOS  --   --   --  3.3  --    Liver Function Tests: Recent Labs  Lab 03/10/20 0328  AST 17  ALT 16  ALKPHOS 46  BILITOT 0.7  PROT 5.6*  ALBUMIN 3.0*   No results for input(s): LIPASE, AMYLASE in the last 168 hours. No results for input(s): AMMONIA in the last 168 hours. CBC: Recent Labs  Lab 03/09/20 0259 03/10/20 0328 03/11/20 0702 03/12/20 0215 03/13/20 0552  WBC 13.7* 11.2* 12.0* 10.4 8.0  NEUTROABS 12.2* 9.0*  --   --   --   HGB 12.1* 11.3* 9.9* 9.3* 9.0*  HCT 37.7* 35.9* 32.7* 29.6* 29.5*  MCV 80.4 82.7 83.4 84.3 84.8  PLT 205 188 153 178 159   Cardiac Enzymes: No results for input(s): CKTOTAL, CKMB, CKMBINDEX, TROPONINI in the last 168 hours. BNP: Invalid input(s): POCBNP CBG: Recent Labs  Lab 03/13/20 0800 03/13/20 1114 03/13/20 1609 03/13/20 2050 03/14/20 0336  GLUCAP 120* 158* 147* 159* 143*   D-Dimer No results for input(s): DDIMER in the last 72 hours. Hgb A1c No results for input(s): HGBA1C in the last 72 hours. Lipid Profile No results for input(s): CHOL, HDL, LDLCALC, TRIG, CHOLHDL, LDLDIRECT in the last 72 hours. Thyroid function studies No results for input(s): TSH, T4TOTAL, T3FREE, THYROIDAB in the last 72 hours.  Invalid input(s): FREET3 Anemia work up No results for input(s): VITAMINB12, FOLATE, FERRITIN, TIBC, IRON, RETICCTPCT  in the last 72 hours. Urinalysis    Component Value Date/Time   COLORURINE YELLOW 03/09/2020 0605   APPEARANCEUR CLEAR 03/09/2020 0605   APPEARANCEUR Clear 02/15/2020 1321   LABSPEC 1.019 03/09/2020 0605   PHURINE 6.0 03/09/2020 0605   GLUCOSEU NEGATIVE 03/09/2020 0605   HGBUR NEGATIVE 03/09/2020 0605   BILIRUBINUR NEGATIVE 03/09/2020 0605   BILIRUBINUR Negative 02/15/2020 1321   KETONESUR NEGATIVE 03/09/2020 0605   PROTEINUR 30 (A)  03/09/2020 0605   UROBILINOGEN negative (A) 10/19/2019 0958   UROBILINOGEN 0.2 05/08/2014 2045   NITRITE NEGATIVE 03/09/2020 0605   LEUKOCYTESUR NEGATIVE 03/09/2020 0605   Sepsis Labs Invalid input(s): PROCALCITONIN,  WBC,  LACTICIDVEN Microbiology Recent Results (from the past 240 hour(s))  Respiratory Panel by RT PCR (Flu A&B, Covid) - Nasopharyngeal Swab     Status: None   Collection Time: 03/09/20  3:55 AM   Specimen: Nasopharyngeal Swab  Result Value Ref Range Status   SARS Coronavirus 2 by RT PCR NEGATIVE NEGATIVE Final    Comment: (NOTE) SARS-CoV-2 target nucleic acids are NOT DETECTED.  The SARS-CoV-2 RNA is generally detectable in upper respiratoy specimens during the acute phase of infection. The lowest concentration of SARS-CoV-2 viral copies this assay can detect is 131 copies/mL. A negative result does not preclude SARS-Cov-2 infection and should not be used as the sole basis for treatment or other patient management decisions. A negative result may occur with  improper specimen collection/handling, submission of specimen other than nasopharyngeal swab, presence of viral mutation(s) within the areas targeted by this assay, and inadequate number of viral copies (<131 copies/mL). A negative result must be combined with clinical observations, patient history, and epidemiological information. The expected result is Negative.  Fact Sheet for Patients:  PinkCheek.be  Fact Sheet for Healthcare Providers:  GravelBags.it  This test is no t yet approved or cleared by the [place] FDA and  has been authorized for detection and/or diagnosis of SARS-CoV-2 by FDA under an Emergency Use Authorization (EUA). This EUA will remain  in effect (meaning this test can be used) for the duration of the COVID-19 declaration under Section 564(b)(1) of the Act, 21 U.S.C. section 360bbb-3(b)(1), unless the authorization is  terminated or revoked sooner.     Influenza A by PCR NEGATIVE NEGATIVE Final   Influenza B by PCR NEGATIVE NEGATIVE Final    Comment: (NOTE) The Xpert Xpress SARS-CoV-2/FLU/RSV assay is intended as an aid in  the diagnosis of influenza from Nasopharyngeal swab specimens and  should not be used as a sole basis for treatment. Nasal washings and  aspirates are unacceptable for Xpert Xpress SARS-CoV-2/FLU/RSV  testing.  Fact Sheet for Patients: PinkCheek.be  Fact Sheet for Healthcare Providers: GravelBags.it  This test is not yet approved or cleared by the [place] FDA and  has been authorized for detection and/or diagnosis of SARS-CoV-2 by  FDA under an Emergency Use Authorization (EUA). This EUA will remain  in effect (meaning this test can be used) for the duration of the  Covid-19 declaration under Section 564(b)(1) of the Act, 21  U.S.C. section 360bbb-3(b)(1), unless the authorization is  terminated or revoked. Performed at [facility], [address], [place], [place] [address]   Surgical PCR screen     Status: None   Collection Time: 03/09/20 11:31 AM   Specimen: Nasal Mucosa; Nasal Swab  Result Value Ref Range Status   MRSA, PCR NEGATIVE NEGATIVE Final   Staphylococcus aureus NEGATIVE NEGATIVE Final    Comment: (NOTE)  The Xpert SA Assay (FDA approved for NASAL specimens in patients 36 years of age and older), is one component of a comprehensive surveillance program. It is not intended to diagnose infection nor to guide or monitor treatment. Performed at [facility], [address] [place], [place] [address]   SARS Coronavirus 2 by RT PCR (hospital order, performed in [facility] hospital lab) Nasopharyngeal Nasopharyngeal Swab     Status: None   Collection Time: 03/13/20 10:59 AM   Specimen: Nasopharyngeal Swab  Result Value Ref Range Status   SARS Coronavirus 2 NEGATIVE NEGATIVE Final    Comment:  (NOTE) SARS-CoV-2 target nucleic acids are NOT DETECTED.  The SARS-CoV-2 RNA is generally detectable in upper and lower respiratory specimens during the acute phase of infection. The lowest concentration of SARS-CoV-2 viral copies this assay can detect is 250 copies / mL. A negative result does not preclude SARS-CoV-2 infection and should not be used as the sole basis for treatment or other patient management decisions.  A negative result may occur with improper specimen collection / handling, submission of specimen other than nasopharyngeal swab, presence of viral mutation(s) within the areas targeted by this assay, and inadequate number of viral copies (<250 copies / mL). A negative result must be combined with clinical observations, patient history, and epidemiological information.  Fact Sheet for Patients:   StrictlyIdeas.no  Fact Sheet for Healthcare Providers: BankingDealers.co.za  This test is not yet approved or  cleared by the [place] FDA and has been authorized for detection and/or diagnosis of SARS-CoV-2 by FDA under an Emergency Use Authorization (EUA).  This EUA will remain in effect (meaning this test can be used) for the duration of the COVID-19 declaration under Section 564(b)(1) of the Act, 21 U.S.C. section 360bbb-3(b)(1), unless the authorization is terminated or revoked sooner.  Performed at [facility], [address] [place], [place] [address]    Time coordinating discharge: 38 mins   SIGNED:  Irwin Brakeman, MD  [facility] 03/14/2020, 10:06 AM How to contact the [facility] Attending or Consulting provider [place] or covering provider during after hours [place], for this patient?  1. Check the care team in [facility] and look for a) attending/consulting TRH provider listed and b) the [facility] team listed 2. Log into [email or web address] and use [place]'s universal password to access. If you do not have the password,  please contact the hospital operator. 3. Locate the [facility] provider you are looking for under [facility] and page to a number that you can be directly reached. 4. If you still have difficulty reaching the provider, please page the [facility] (Director on Call) for the Hospitalists listed on amion for assistance.

## 2020-03-14 NOTE — TOC Transition Note (Addendum)
Transition of Care [facility]) - CM/SW Discharge Note  Patient Details  Name: Jeffrey Sherman MRN: [id number] Date of Birth: 1930/03/05  [facility] of [facility]) CM/SW Contact:  [name], LCSW Phone Number: 03/14/2020, 10:40 AM  Clinical Narrative: Patient is medically stable to discharge to [facility]. CSW faxed discharge summary, discharge orders, FL2, and therapy notes to [facility]. CSW spoke with [facility] in admissions with [facility] Patient will go to room 155 and the number to call for report is [phone]. Patient had negative COVID test 03/13/20. CSW left multiple VM for patient's son regarding transfer. TOC signing off.  Final next level of care: Skilled Nursing Facility Barriers to Discharge: Barriers Resolved  Patient Goals and CMS Choice Patient states their goals for this hospitalization and ongoing recovery are:: Discharge to [facility] CMS Medicare.gov Compare Post Acute Care list provided to:: Patient Represenative (must comment) [name]. (son)) Choice offered to / list presented to : Adult Children  Discharge Placement PASRR number recieved: 03/13/20        Patient chooses bed at: [facility] Name of family member notified: [name]. Patient and family notified of of transfer: 03/14/20  Discharge Plan and Services        DME Arranged: N/A DME Agency: NA HH Arranged: NA [place] Agency: NA  Readmission Risk Interventions No flowsheet data found.

## 2020-03-15 ENCOUNTER — Non-Acute Institutional Stay (SKILLED_NURSING_FACILITY): Payer: Medicare Other | Admitting: Internal Medicine

## 2020-03-15 ENCOUNTER — Encounter: Payer: Self-pay | Admitting: Internal Medicine

## 2020-03-15 DIAGNOSIS — J849 Interstitial pulmonary disease, unspecified: Secondary | ICD-10-CM | POA: Insufficient documentation

## 2020-03-15 DIAGNOSIS — N3281 Overactive bladder: Secondary | ICD-10-CM

## 2020-03-15 DIAGNOSIS — S72001A Fracture of unspecified part of neck of right femur, initial encounter for closed fracture: Secondary | ICD-10-CM

## 2020-03-15 DIAGNOSIS — F339 Major depressive disorder, recurrent, unspecified: Secondary | ICD-10-CM | POA: Insufficient documentation

## 2020-03-15 DIAGNOSIS — D62 Acute posthemorrhagic anemia: Secondary | ICD-10-CM | POA: Diagnosis not present

## 2020-03-15 DIAGNOSIS — N289 Disorder of kidney and ureter, unspecified: Secondary | ICD-10-CM

## 2020-03-15 DIAGNOSIS — C61 Malignant neoplasm of prostate: Secondary | ICD-10-CM

## 2020-03-15 DIAGNOSIS — I10 Essential (primary) hypertension: Secondary | ICD-10-CM

## 2020-03-15 DIAGNOSIS — E785 Hyperlipidemia, unspecified: Secondary | ICD-10-CM | POA: Insufficient documentation

## 2020-03-15 DIAGNOSIS — E876 Hypokalemia: Secondary | ICD-10-CM

## 2020-03-15 HISTORY — DX: Interstitial pulmonary disease, unspecified: J84.9

## 2020-03-15 NOTE — Assessment & Plan Note (Signed)
Trial of generic Flomax discussed with NP

## 2020-03-15 NOTE — Assessment & Plan Note (Signed)
Urology monitor as scheduled

## 2020-03-15 NOTE — Assessment & Plan Note (Signed)
Post cannulated hip pinning hemoglobin dropped to 9.3. Monitor closely because of full dose aspirin prophylaxis in the context of past medical history of PUD. Change aspirin to enteric-coated and give after breakfast.

## 2020-03-15 NOTE — Assessment & Plan Note (Signed)
PT/OT at SNF.  Weightbearing as tolerated.

## 2020-03-15 NOTE — Assessment & Plan Note (Signed)
Avoid nephrotoxic agents such as spironolactone

## 2020-03-15 NOTE — Patient Instructions (Signed)
See assessment and plan under each diagnosis in the problem list and acutely for this visit 

## 2020-03-15 NOTE — Assessment & Plan Note (Addendum)
Potassium 3.2 in the context of HCTZ therapy.  HCTZ will be discontinued and hydralazine initiated.  Spironolactone not an option due to CKD. Repeat labs pending.

## 2020-03-15 NOTE — Assessment & Plan Note (Addendum)
Blood pressure initially 787 systolic; recheck 183/67.  He had been on HCTZ prior to his hospitalization.  This is relatively contraindicated because of the hypokalemia he has exhibited as well as the AKI superimposed on CKD stage IIIa.  Spironolactone is contraindicated in the context of CKD.  High-dose ACE inhibitor will also need to be monitored in view of the CKD.  If bradycardia is a persistent problem the rate limiting calcium channel blocker will need to be reassessed. Hydralazine will be ordered 3 times daily as needed for systolics greater than 255.

## 2020-03-15 NOTE — Progress Notes (Signed)
NURSING HOME LOCATION: [place] NUMBER: 143/P   CODE STATUS: Full Code  PCP:  Celene Squibb, MD  This is a comprehensive admission note to [facility] performed on this date less than 30 days from date of admission. Included are preadmission medical/surgical history; reconciled medication list; family history; social history and comprehensive review of systems.  Corrections and additions to the records were documented. Comprehensive physical exam was also performed. Additionally a clinical summary was entered for each active diagnosis pertinent to this admission in the Problem List to enhance continuity of care.  HPI: Patient was hospitalized 9/30-10/10/2019 for right hip fracture sustained in a mechanical fall.  He actually fell approximately 7:30 PM but declined to go to the ED despite ongoing pain.  At approximately 12:30 AM he was unable to rise from the commode even with help prompting EMS transfer to the ED.  Prior to arrival in ED he had received hydrocodone and a muscle relaxant without subjective benefit. White count was 13,700; hemoglobin was 12.1.  Hypokalemia was present with a potassium of 3.2.  AKI was documented with a creatinine of 1.50.  EKG revealed sinus bradycardia with a rate of 53 with QT interval of 461.  Imaging revealed minimally displaced right femoral neck fracture.  Chest x-ray suggested chronic bronchitic changes or possibly multifocal pneumonia.  He received nasal oxygen at 2 L/min. Cannulated right hip pinning was performed 10/1 by Dr. Arther Abbott.  This was associated with acute blood loss anemia.  Nadir hemoglobin was 9.3. AKI resolved with IV rehydration and discontinuation of HCTZ. Urine output was suboptimal; renal ultrasound was normal and no bladder outlet obstruction suggested. Repeat chest x-ray 10/3 did not suggest community associated pneumonia.( I personally reviewed the films. Present are interstitial ,reticulonodular  changes. The peribronchial changes are most prominent in the RLL). The elevated white count was felt to be reactive leukocytosis as there were no signs or symptoms of active infection.  White count did drop to 10,400. Postop weightbearing as tolerated was recommended. Orthopedic follow-up for suture removal scheduled for 10/13. Full dose aspirin prophylaxis was to be continued through 11/5; at that time dose was to be decreased to 81 mg daily.  Past medical and surgical history:includes GERD, dyslipidemia, history of peptic ulcer disease, essential hypertension, history of prostate cancer, and chronic pain syndrome. Surgeries and procedures include upper endoscopy, ORIF wrist fracture, and prostate biopsy apparently. He denies RRP or radiation; but apparently he had received ADT.  Social history: Nondrinker; never smoked.  Family history: There is a strong family history of heart disease/MI as well as cancer.  This is not contributory as he is 86 years old.   Review of systems: He states that he is tired having worked with PT/OT. He describes his hip pain as "terrible". The veracity of his responses is somewhat questionable as he could not give me the date he fell. He began to talk about having fallen June 24 and injuring his left elbow. He could not name his Orthopedist. He can also not name the Urologist who had treated his prostate cancer. Positive responses include intermittent shortness of breath as well as intermittent heart racing. His major concern is "overactive bladder" with urination every 15-60 minutes. He believes he is not receiving the medication prescribed by his Urologist for this. Indeed he had been on maintenance doses of Ditropan as well as Hytrin; both were discontinued. He states that he did have a prostate biopsy. He had no  further surgical intervention stating "I would not let them". He did state that he was on "women's shots", apparently androgen deprivation therapy. He does  validate that he has a history of ulcer. He describes dysphagia almost daily. The week prior to admission he had a virtual visit with his PCP and was given a Z-Pak for cough.  There was no improvement in the symptoms despite the antibiotic.  Constitutional: No fever, significant weight change  Eyes: No redness, discharge, pain, vision change ENT/mouth: No nasal congestion, purulent discharge, earache, change in hearing, sore throat  Cardiovascular: No chest pain, paroxysmal nocturnal dyspnea, claudication, edema  Respiratory: No sputum production, hemoptysis, significant snoring, apnea  Gastrointestinal: No heartburn, abdominal pain, nausea /vomiting, rectal bleeding, melena, change in bowels Genitourinary: No dysuria, hematuria, pyuria Dermatologic: No rash, pruritus, change in appearance of skin Neurologic: No dizziness, headache, syncope, seizures, numbness, tingling Psychiatric: No significant anxiety, depression, insomnia, anorexia Endocrine: No change in hair/skin/nails, excessive thirst, excessive hunger  Hematologic/lymphatic: No significant bruising, lymphadenopathy, abnormal bleeding Allergy/immunology: No itchy/watery eyes, significant sneezing, urticaria, angioedema  Physical exam:  Pertinent or positive findings: As noted he seems to be somewhat confused. Pattern alopecia is present. Maxilla is edentulous. He has a few remaining mandibular teeth. His heart rhythm and rate are slow and slightly irregular. He has low-grade rales at the bases; breath sounds are decreased superiorly. Abdomen is protuberant. Trace edema is noted at the sock line. Pedal pulses are decreased. There is a deformity of the left elbow.  General appearance: Adequately nourished; no acute distress, increased work of breathing is present.   Lymphatic: No lymphadenopathy about the head, neck, axilla. Eyes: No conjunctival inflammation or lid edema is present. There is no scleral icterus. Ears:  External ear exam  shows no significant lesions or deformities.   Nose:  External nasal examination shows no deformity or inflammation. Nasal mucosa are pink and moist without lesions, exudates Oral exam: Lips and gums are healthy appearing.There is no oropharyngeal erythema or exudate. Neck:  No thyromegaly, masses, tenderness noted.    Heart:  No gallop, murmur, click, rub.  Lungs:  without wheezes, rhonchi, rubs. Abdomen: Bowel sounds are normal.  Abdomen is soft and nontender with no organomegaly, hernias, masses. GU: Deferred  Extremities:  No cyanosis, clubbing. Neurologic exam: Balance, Rhomberg, finger to nose testing could not be completed due to clinical state Skin: Warm & dry w/o tenting. No significant lesions or rash.  See clinical summary under each active problem in the Problem List with associated updated therapeutic plan

## 2020-03-16 ENCOUNTER — Non-Acute Institutional Stay (SKILLED_NURSING_FACILITY): Payer: Medicare Other | Admitting: Adult Health

## 2020-03-16 ENCOUNTER — Encounter: Payer: Self-pay | Admitting: Adult Health

## 2020-03-16 ENCOUNTER — Encounter: Payer: Self-pay | Admitting: Internal Medicine

## 2020-03-16 ENCOUNTER — Other Ambulatory Visit (HOSPITAL_COMMUNITY)
Admission: RE | Admit: 2020-03-16 | Discharge: 2020-03-16 | Disposition: A | Discharge status: Home / Self Care | Payer: Medicare Other | Source: Skilled Nursing Facility | Attending: Adult Health | Admitting: Adult Health

## 2020-03-16 DIAGNOSIS — E876 Hypokalemia: Secondary | ICD-10-CM

## 2020-03-16 DIAGNOSIS — F0390 Unspecified dementia without behavioral disturbance: Secondary | ICD-10-CM | POA: Insufficient documentation

## 2020-03-16 DIAGNOSIS — D508 Other iron deficiency anemias: Secondary | ICD-10-CM

## 2020-03-16 DIAGNOSIS — I10 Essential (primary) hypertension: Secondary | ICD-10-CM | POA: Diagnosis not present

## 2020-03-16 DIAGNOSIS — E43 Unspecified severe protein-calorie malnutrition: Secondary | ICD-10-CM | POA: Diagnosis not present

## 2020-03-16 DIAGNOSIS — S72041D Displaced fracture of base of neck of right femur, subsequent encounter for closed fracture with routine healing: Secondary | ICD-10-CM | POA: Insufficient documentation

## 2020-03-16 DIAGNOSIS — X58XXXD Exposure to other specified factors, subsequent encounter: Secondary | ICD-10-CM | POA: Diagnosis not present

## 2020-03-16 LAB — COMPREHENSIVE METABOLIC PANEL
ALT: 33 U/L (ref 0–44)
AST: 32 U/L (ref 15–41)
Albumin: 2.5 g/dL — ABNORMAL LOW (ref 3.5–5.0)
Alkaline Phosphatase: 85 U/L (ref 38–126)
Anion gap: 11 (ref 5–15)
BUN: 18 mg/dL (ref 8–23)
CO2: 26 mmol/L (ref 22–32)
Calcium: 9.2 mg/dL (ref 8.9–10.3)
Chloride: 102 mmol/L (ref 98–111)
Creatinine, Ser: 0.86 mg/dL (ref 0.61–1.24)
GFR calc non Af Amer: >60 mL/min (ref >60)
Glucose, Bld: 127 mg/dL — ABNORMAL HIGH (ref 70–99)
Potassium: 3.3 mmol/L — ABNORMAL LOW (ref 3.5–5.1)
Sodium: 139 mmol/L (ref 135–145)
Total Bilirubin: 0.8 mg/dL (ref 0.3–1.2)
Total Protein: 5.8 g/dL — ABNORMAL LOW (ref 6.5–8.1)

## 2020-03-16 LAB — IRON AND TIBC
Iron: 23 ug/dL — ABNORMAL LOW (ref 45–182)
Saturation Ratios: 10 % — ABNORMAL LOW (ref 17.9–39.5)
TIBC: 221 ug/dL — ABNORMAL LOW (ref 250–450)
UIBC: 198 ug/dL

## 2020-03-16 LAB — HEMOGLOBIN AND HEMATOCRIT, BLOOD
HCT: 33.4 % — ABNORMAL LOW (ref 39.0–52.0)
Hemoglobin: 10.7 g/dL — ABNORMAL LOW (ref 13.0–17.0)

## 2020-03-16 LAB — FERRITIN: Ferritin: 137 ng/mL (ref 24–336)

## 2020-03-16 NOTE — Progress Notes (Signed)
Location:    [place] Room Number: 143/P Place of Service:  SNF (31)   CODE STATUS: Full Code  No Known Allergies  Chief Complaint  Patient presents with  . Acute Visit    Lab Follow Up    HPI:  He is hypokalemic low albumin and low iron levels. His BCAT is 24/50. He is cognitively impaired; his blood pressure remains elevated. There are no reports of uncontrolled pain; no reports of headaches; no reports of changes in appetite. He continues to participate in therapy.    Past Medical History:  Diagnosis Date  . Anxiety   . Cancer [facility])    prostate  . Chronic chest wall pain   . Essential hypertension, benign 06/20/2016  . GERD (gastroesophageal reflux disease)   . H/O echocardiogram 2016   normal  . Heart murmur    "leaking heart valve"  . High cholesterol 06/20/2016  . HOH (hard of hearing)    left  . Poor historian   . PUD (peptic ulcer disease)   . Shortness of breath     Past Surgical History:  Procedure Laterality Date  . ESOPHAGOGASTRODUODENOSCOPY    . ESOPHAGOGASTRODUODENOSCOPY N/A 07/18/2016   Procedure: ESOPHAGOGASTRODUODENOSCOPY (EGD);  Surgeon: Rogene Houston, MD;  Location: [facility] ENDO SUITE;  Service: Endoscopy;  Laterality: N/A;  3:00-moved to 230 per Lelon Frohlich  . HIP PINNING,CANNULATED Right 03/10/2020   Procedure: INTERNAL FIXATION RIGHT HIP;  Surgeon: Carole Civil, MD;  Location: [facility] ORS;  Service: Orthopedics;  Laterality: Right;  . ORIF WRIST FRACTURE  12/19/2011   Procedure: OPEN REDUCTION INTERNAL FIXATION (ORIF) WRIST FRACTURE;  Surgeon: Carole Civil, MD;  Location: [facility] ORS;  Service: Orthopedics;  Laterality: Right;  . prostate cancer     Diagnosed in the 90s  . TONSILLECTOMY      Social History   Socioeconomic History  . Marital status: Widowed    Spouse name: Not on file  . Number of children: Not on file  . Years of education: Not on file  . Highest education level: Not on file  Occupational History  .  Occupation: retired    Fish farm manager: RETIRED  Tobacco Use  . Smoking status: Never Smoker  . Smokeless tobacco: Never Used  Substance and Sexual Activity  . Alcohol use: No  . Drug use: No  . Sexual activity: Not on file  Other Topics Concern  . Not on file  Social History Narrative  . Not on file   Social Determinants of Health   Financial Resource Strain:   . Difficulty of Paying Living Expenses: Not on file  Food Insecurity:   . Worried About Charity fundraiser in the Last Year: Not on file  . Ran Out of Food in the Last Year: Not on file  Transportation Needs:   . Lack of Transportation (Medical): Not on file  . Lack of Transportation (Non-Medical): Not on file  Physical Activity:   . Days of Exercise per Week: Not on file  . Minutes of Exercise per Session: Not on file  Stress:   . Feeling of Stress : Not on file  Social Connections:   . Frequency of Communication with Friends and Family: Not on file  . Frequency of Social Gatherings with Friends and Family: Not on file  . Attends Religious Services: Not on file  . Active Member of Clubs or Organizations: Not on file  . Attends Archivist Meetings: Not on file  .  Marital Status: Not on file  Intimate Partner Violence:   . Fear of Current or Ex-Partner: Not on file  . Emotionally Abused: Not on file  . Physically Abused: Not on file  . Sexually Abused: Not on file   Family History  Problem Relation Age of Onset  . Cancer Other   . Heart attack Mother   . Cancer Brother   . Heart attack Brother   . Heart disease Sister        by pass      VITAL SIGNS BP (!) 170/84   Pulse 100   Temp (!) 97.2 F (36.2 C)   Resp 20   Ht 5\' 9"  (1.753 m)   Wt 213 lb 9.6 oz (96.9 kg)   SpO2 93%   BMI 31.54 kg/m   Outpatient Encounter Medications as of 03/16/2020  Medication Sig  . acetaminophen (TYLENOL) 500 MG tablet Take 1 tablet (500 mg total) by mouth 3 (three) times daily.  Marland Kitchen alfuzosin (UROXATRAL) 10 MG 24  hr tablet Take 10 mg by mouth daily with breakfast.  . Amino Acids-Protein Hydrolys (PRO-STAT PO) liquid; 15-100 gram-kcal/30 mL; amt: 30 ml; oral Special Instructions: for albumin 2.5 With Meals  . aspirin EC 325 MG EC tablet Take 325 mg daily thru 11/5 then resume 81 mg daily  . Balsam [place]-Castor Oil [facility] OINT Special Instructions: Apply to bilateral buttocks & sacral area qshift for blanchable erythema.  . calcium carbonate (TUMS - DOSED IN MG ELEMENTAL CALCIUM) 500 MG chewable tablet Chew 1 tablet by mouth 2 (two) times daily as needed for indigestion or heartburn.   . diltiazem (CARDIZEM CD) 240 MG 24 hr capsule Take 1 capsule (240 mg total) by mouth daily.  Derrill Memo ON 03/17/2020] ferrous sulfate 325 (65 FE) MG tablet Take 325 mg by mouth daily with breakfast. For Anemia and  Iron Deficiency  . Flax Oil-Fish Oil-Borage Oil (FISH OIL-FLAX OIL-BORAGE OIL) CAPS Take 1 capsule by mouth daily.  . hydrALAZINE (APRESOLINE) 50 MG tablet Take 50 mg by mouth 3 (three) times daily.  Marland Kitchen lisinopril (ZESTRIL) 40 MG tablet Take 1 tablet (40 mg total) by mouth daily.  . Melatonin 10 MG TABS Take 10 mg by mouth at bedtime.  Derrill Memo ON 03/25/2020] memantine (NAMENDA XR) 14 MG CP24 24 hr capsule Take 14 mg by mouth daily. For Dementia  . [START ON 04/02/2020] memantine (NAMENDA XR) 21 MG CP24 24 hr capsule Take 21 mg by mouth daily.  Derrill Memo ON 04/10/2020] memantine (NAMENDA XR) 28 MG CP24 24 hr capsule Take 28 mg by mouth daily.  Derrill Memo ON 03/17/2020] memantine (NAMENDA XR) 7 MG CP24 24 hr capsule Take 7 mg by mouth daily.  . NON FORMULARY Diet - Mechanical Soft  . omeprazole (PRILOSEC) 20 MG capsule Take 1 capsule (20 mg total) by mouth daily before breakfast.  . OXYGEN Inhale 2 L into the lungs continuous.  . polyethylene glycol (MIRALAX / GLYCOLAX) 17 g packet Take 17 g by mouth every other day.  Derrill Memo ON 03/17/2020] potassium chloride SA (KLOR-CON) 20 MEQ tablet Take 20 mEq by mouth daily.  Marland Kitchen  senna-docusate (SENOKOT-S) 8.6-50 MG tablet Take 1 tablet by mouth at bedtime as needed for mild constipation.  . simvastatin (ZOCOR) 10 MG tablet Take 1 tablet (10 mg total) by mouth at bedtime.  Marland Kitchen terazosin (HYTRIN) 5 MG capsule Take 5 mg by mouth daily.   Marland Kitchen venlafaxine XR (EFFEXOR-XR) 75 MG 24 hr capsule  Take 75 mg by mouth daily.  . vitamin C (ASCORBIC ACID) 500 MG tablet Take 1,000 mg by mouth daily.   No facility-administered encounter medications on file as of 03/16/2020.     SIGNIFICANT DIAGNOSTIC EXAMS  PREVIOUS   03-09-20: right hip x-ray: Minimally displaced right femoral neck fracture.  03-09-20: chest x-ray: Peribronchial thickening with streaky perihilar infiltrates suggesting bronchitis or multifocal pneumonia.  03-12-20: renal ultrasound: No acute abnormality. Bilateral renal cysts  03-12-20: chest x-ray: Lower lung volumes. No acute cardiopulmonary abnormality.   NO NEW EXAMS.   LABS REVIEWED PREVIOUS    03-09-20: wbc 13.7; hgb 12.1; hct 37.7; mcv 80.4 plt 205; glucose 208; bun 21; creat 1.50; k+ 3.2; na++ 141; ca 9.2; hgb a1c 5.9; HIV: nr 03-10-20: wbc 11.2; hgb 11.3; hct 35.9; mcv 82.7 plt 188; glucose 160; bun 18; creat 1.21; k+ 3.3; na++ 139; ca 8.8 liver normal 3.0; mag 2.3 03-12-20: wbc 10.4; hgb 9.3; hct 29.6; mcv 84.3 plt 178; glucose 138; bun 34; creat 1.77; k+ 3.8; na++ 138; ca 8.3 03-14-20: glucose 134; bun 36; creat 1.23; k+ 3.2; na++ 139; ca 8.8 mag 2.1    TODAY;   03-16-20: hgb 10.7; hct 33.4 glucose 127; bun 18; creat 0.86 k+ 3.3; na++ 139; ca 9.2 ;liver normal albumin 2.5 iron 23; tibc 221; ferritin 137   Review of Systems  Unable to perform ROS: Dementia (unable to participate )    Physical Exam Constitutional:      General: He is not in acute distress.    Appearance: He is well-developed. He is obese. He is not diaphoretic.  HENT:     Ears:     Comments: HOH L > R  Neck:     Thyroid: No thyromegaly.  Cardiovascular:     Rate and Rhythm: Normal  rate and regular rhythm.     Pulses: Normal pulses.     Heart sounds: Normal heart sounds.  Pulmonary:     Effort: Pulmonary effort is normal. No respiratory distress.     Breath sounds: Normal breath sounds.  Abdominal:     General: Bowel sounds are normal. There is no distension.     Palpations: Abdomen is soft.     Tenderness: There is no abdominal tenderness.  Musculoskeletal:     Cervical back: Neck supple.     Right lower leg: No edema.     Left lower leg: No edema.     Comments: Is able to move all extremities  Status post right hip internal fixation 03-10-20  Lymphadenopathy:     Cervical: No cervical adenopathy.  Skin:    General: Skin is warm and dry.     Comments: Incision line without signs of infection present   Neurological:     Mental Status: He is alert. Mental status is at baseline.  Psychiatric:        Mood and Affect: Mood normal.      ASSESSMENT/ PLAN:  TODAY  1. Essential hypertension 2. Hypokalemia 3. Protein calorie malnutrition severe 4. Dementia without behavioral disturbance unspecified dementia type 5. Iron deficiency anemia due to dietary causes  Will increase hydralazine to 50 mg three times daily  Will begin iron daily  Will begin k+ 20 meq daily  Will repeat k+ on 03-23-20 Will begin prostat 30 cc three times daily  Will begin namenda xr titration weekly to  28 mg daily  Will continue to monitor his status     MD is aware of resident's narcotic  use and is in agreement with current plan of care. We will attempt to wean resident as appropriate.  Ok Edwards NP [facility]  Contact [phone] Monday through Friday 8am- 5pm  After hours call [phone]

## 2020-03-22 ENCOUNTER — Encounter: Payer: Self-pay | Admitting: Adult Health

## 2020-03-22 ENCOUNTER — Inpatient Hospital Stay: Payer: Medicare Other

## 2020-03-22 ENCOUNTER — Ambulatory Visit (INDEPENDENT_AMBULATORY_CARE_PROVIDER_SITE_OTHER): Payer: Medicare Other | Admitting: Orthopedic Surgery

## 2020-03-22 ENCOUNTER — Non-Acute Institutional Stay (SKILLED_NURSING_FACILITY): Payer: Medicare Other | Admitting: Adult Health

## 2020-03-22 DIAGNOSIS — S72001A Fracture of unspecified part of neck of right femur, initial encounter for closed fracture: Secondary | ICD-10-CM

## 2020-03-22 DIAGNOSIS — I10 Essential (primary) hypertension: Secondary | ICD-10-CM

## 2020-03-22 DIAGNOSIS — F0390 Unspecified dementia without behavioral disturbance: Secondary | ICD-10-CM | POA: Insufficient documentation

## 2020-03-22 DIAGNOSIS — E441 Mild protein-calorie malnutrition: Secondary | ICD-10-CM | POA: Insufficient documentation

## 2020-03-22 DIAGNOSIS — D508 Other iron deficiency anemias: Secondary | ICD-10-CM | POA: Insufficient documentation

## 2020-03-22 DIAGNOSIS — E43 Unspecified severe protein-calorie malnutrition: Secondary | ICD-10-CM | POA: Insufficient documentation

## 2020-03-22 DIAGNOSIS — K219 Gastro-esophageal reflux disease without esophagitis: Secondary | ICD-10-CM

## 2020-03-22 HISTORY — DX: Other iron deficiency anemias: D50.8

## 2020-03-22 NOTE — Progress Notes (Signed)
Location:    [place] Room Number: 143/P Place of Service:  SNF (31)   CODE STATUS: Full Code  No Known Allergies  Chief Complaint  Patient presents with  . Short Term Rehab (STR)           Closed displaced fracture of right femoral neck:    Essential hypertension Gastroesophageal reflux disease without esophagitis    Weekly follow up for the first 30 days post hospitalization.     HPI:  He is a 86 year old short term rehab patient being seen for the management of his chronic illnesses: Closed displaced fracture of right femoral neck:    Essential hypertension Gastroesophageal reflux disease without esophagitis. There are no reports of uncontrolled pain. No reports of agitation or anxiety. His blood pressure is not adequately controlled.   Past Medical History:  Diagnosis Date  . Anxiety   . Cancer [facility])    prostate  . Chronic chest wall pain   . Essential hypertension, benign 06/20/2016  . GERD (gastroesophageal reflux disease)   . H/O echocardiogram 2016   normal  . Heart murmur    "leaking heart valve"  . High cholesterol 06/20/2016  . HOH (hard of hearing)    left  . Poor historian   . PUD (peptic ulcer disease)   . Shortness of breath     Past Surgical History:  Procedure Laterality Date  . ESOPHAGOGASTRODUODENOSCOPY    . ESOPHAGOGASTRODUODENOSCOPY N/A 07/18/2016   Procedure: ESOPHAGOGASTRODUODENOSCOPY (EGD);  Surgeon: Rogene Houston, MD;  Location: [facility] ENDO [facility];  Service: Endoscopy;  Laterality: N/A;  3:00-moved to 230 per Lelon Frohlich  . HIP PINNING,CANNULATED Right 03/10/2020   Procedure: INTERNAL FIXATION RIGHT HIP;  Surgeon: Carole Civil, MD;  Location: [facility] ORS;  Service: Orthopedics;  Laterality: Right;  . ORIF WRIST FRACTURE  12/19/2011   Procedure: OPEN REDUCTION INTERNAL FIXATION (ORIF) WRIST FRACTURE;  Surgeon: Carole Civil, MD;  Location: [facility] ORS;  Service: Orthopedics;  Laterality: Right;  . prostate cancer     Diagnosed in the  90s  . TONSILLECTOMY      Social History   Socioeconomic History  . Marital status: Widowed    Spouse name: Not on file  . Number of children: Not on file  . Years of education: Not on file  . Highest education level: Not on file  Occupational History  . Occupation: retired    Fish farm manager: RETIRED  Tobacco Use  . Smoking status: Never Smoker  . Smokeless tobacco: Never Used  Substance and Sexual Activity  . Alcohol use: No  . Drug use: No  . Sexual activity: Not on file  Other Topics Concern  . Not on file  Social History Narrative  . Not on file   Social Determinants of Health   Financial Resource Strain:   . Difficulty of Paying Living Expenses: Not on file  Food Insecurity:   . Worried About Charity fundraiser in the Last Year: Not on file  . Ran Out of Food in the Last Year: Not on file  Transportation Needs:   . Lack of Transportation (Medical): Not on file  . Lack of Transportation (Non-Medical): Not on file  Physical Activity:   . Days of Exercise per Week: Not on file  . Minutes of Exercise per Session: Not on file  Stress:   . Feeling of Stress : Not on file  Social Connections:   . Frequency of Communication with Friends and Family:  Not on file  . Frequency of Social Gatherings with Friends and Family: Not on file  . Attends Religious Services: Not on file  . Active Member of Clubs or Organizations: Not on file  . Attends Archivist Meetings: Not on file  . Marital Status: Not on file  Intimate Partner Violence:   . Fear of Current or Ex-Partner: Not on file  . Emotionally Abused: Not on file  . Physically Abused: Not on file  . Sexually Abused: Not on file   Family History  Problem Relation Age of Onset  . Cancer Other   . Heart attack Mother   . Cancer Brother   . Heart attack Brother   . Heart disease Sister        by pass      VITAL SIGNS BP (!) 184/90   Pulse 96   Temp 98.7 F (37.1 C)   Resp 20   Ht 5\' 9"  (1.753 m)   Wt  215 lb 8 oz (97.8 kg)   SpO2 97%   BMI 31.82 kg/m   Outpatient Encounter Medications as of 03/22/2020  Medication Sig  . acetaminophen (TYLENOL) 500 MG tablet Take 1 tablet (500 mg total) by mouth 3 (three) times daily.  Marland Kitchen alfuzosin (UROXATRAL) 10 MG 24 hr tablet Take 10 mg by mouth daily with breakfast.  . Amino Acids-Protein Hydrolys (PRO-STAT PO) liquid; 15-100 gram-kcal/30 mL; amt: 30 ml; oral Special Instructions: for albumin 2.5 With Meals  . aspirin EC 325 MG EC tablet Take 325 mg daily thru 11/5 then resume 81 mg daily  . Balsam [place]-Castor Oil [facility]) OINT Special Instructions: Apply to bilateral buttocks & sacral area qshift for blanchable erythema.  . calcium carbonate (TUMS - DOSED IN MG ELEMENTAL CALCIUM) 500 MG chewable tablet Chew 1 tablet by mouth 2 (two) times daily as needed for indigestion or heartburn.   . diltiazem (CARDIZEM CD) 360 MG 24 hr capsule Take 360 mg by mouth daily. Start 03/23/2020  . ferrous sulfate 325 (65 FE) MG tablet Take 325 mg by mouth daily with breakfast. For Anemia and  Iron Deficiency  . Flax Oil-Fish Oil-Borage Oil (FISH OIL-FLAX OIL-BORAGE OIL) CAPS Take 1 capsule by mouth daily.  . hydrALAZINE (APRESOLINE) 50 MG tablet Take 50 mg by mouth 3 (three) times daily.  Marland Kitchen lisinopril (ZESTRIL) 40 MG tablet Take 1 tablet (40 mg total) by mouth daily.  . Melatonin 10 MG TABS Take 10 mg by mouth at bedtime.  Derrill Memo ON 03/25/2020] memantine (NAMENDA XR) 14 MG CP24 24 hr capsule Take 14 mg by mouth daily. For Dementia  . [START ON 04/02/2020] memantine (NAMENDA XR) 21 MG CP24 24 hr capsule Take 21 mg by mouth daily.  Derrill Memo ON 04/10/2020] memantine (NAMENDA XR) 28 MG CP24 24 hr capsule Take 28 mg by mouth daily.  . memantine (NAMENDA XR) 7 MG CP24 24 hr capsule Take 7 mg by mouth daily.  . NON FORMULARY Diet - Mechanical Soft  . omeprazole (PRILOSEC) 20 MG capsule Take 1 capsule (20 mg total) by mouth daily before breakfast.  . OXYGEN Inhale 2 L into  the lungs continuous.  . polyethylene glycol (MIRALAX / GLYCOLAX) 17 g packet Take 17 g by mouth every other day.  . potassium chloride SA (KLOR-CON) 20 MEQ tablet Take 20 mEq by mouth daily.  Marland Kitchen senna-docusate (SENOKOT-S) 8.6-50 MG tablet Take 1 tablet by mouth at bedtime as needed for mild constipation.  Marland Kitchen terazosin (HYTRIN) 5  MG capsule Take 5 mg by mouth at bedtime.   Marland Kitchen venlafaxine XR (EFFEXOR-XR) 75 MG 24 hr capsule Take 75 mg by mouth daily.  . vitamin C (ASCORBIC ACID) 500 MG tablet Take 1,000 mg by mouth daily.  . [DISCONTINUED] diltiazem (CARDIZEM CD) 240 MG 24 hr capsule Take 1 capsule (240 mg total) by mouth daily.  . [DISCONTINUED] simvastatin (ZOCOR) 10 MG tablet Take 1 tablet (10 mg total) by mouth at bedtime.   No facility-administered encounter medications on file as of 03/22/2020.     SIGNIFICANT DIAGNOSTIC EXAMS   PREVIOUS   03-09-20: right hip x-ray: Minimally displaced right femoral neck fracture.  03-09-20: chest x-ray: Peribronchial thickening with streaky perihilar infiltrates suggesting bronchitis or multifocal pneumonia.  03-12-20: renal ultrasound: No acute abnormality. Bilateral renal cysts  03-12-20: chest x-ray: Lower lung volumes. No acute cardiopulmonary abnormality.   NO NEW EXAMS.   LABS REVIEWED PREVIOUS    03-09-20: wbc 13.7; hgb 12.1; hct 37.7; mcv 80.4 plt 205; glucose 208; bun 21; creat 1.50; k+ 3.2; na++ 141; ca 9.2; hgb a1c 5.9; HIV: nr 03-10-20: wbc 11.2; hgb 11.3; hct 35.9; mcv 82.7 plt 188; glucose 160; bun 18; creat 1.21; k+ 3.3; na++ 139; ca 8.8 liver normal 3.0; mag 2.3 03-12-20: wbc 10.4; hgb 9.3; hct 29.6; mcv 84.3 plt 178; glucose 138; bun 34; creat 1.77; k+ 3.8; na++ 138; ca 8.3 03-14-20: glucose 134; bun 36; creat 1.23; k+ 3.2; na++ 139; ca 8.8 mag 2.1   03-16-20: hgb 10.7; hct 33.4 glucose 127; bun 18; creat 0.86 k+ 3.3; na++ 139; ca 9.2 ;liver normal albumin 2.5 iron 23; tibc 221; ferritin 137  NO NEW LABS.    Review of Systems   Unable to perform ROS: Dementia (unable to participate )     Physical Exam Constitutional:      General: He is not in acute distress.    Appearance: He is well-developed. He is not diaphoretic.  HENT:     Ears:     Comments:  HOH L > R Neck:     Thyroid: No thyromegaly.  Cardiovascular:     Rate and Rhythm: Normal rate and regular rhythm.     Pulses: Normal pulses.     Heart sounds: Normal heart sounds.  Pulmonary:     Effort: Pulmonary effort is normal. No respiratory distress.     Breath sounds: Normal breath sounds.  Abdominal:     General: Bowel sounds are normal. There is no distension.     Palpations: Abdomen is soft.     Tenderness: There is no abdominal tenderness.  Musculoskeletal:     Cervical back: Neck supple.     Right lower leg: No edema.     Left lower leg: No edema.     Comments: Is able to move all extremities  Status post right hip internal fixation 03-10-20   Lymphadenopathy:     Cervical: No cervical adenopathy.  Skin:    General: Skin is warm and dry.  Neurological:     Mental Status: He is alert. Mental status is at baseline.  Psychiatric:        Mood and Affect: Mood normal.      ASSESSMENT/ PLAN:  1.  Closed displaced fracture of right femoral neck: is stable is status post right internal fixation 03-10-20; will continue therapy as directed and will follow up with orthopedics; will continue tylenol 500 mg three times daily asa 325 mg daily through 04-14-20  2. Essential hypertension  is worse: b/p 184/90: will continue lisinopril 40 mg daily hytrin 5 mg daily will increase to hydralazine 100 mg three times daily and cardizem cd 360 mg daily   3. Gastroesophageal reflux disease without esophagitis: is stable will continue prilosec 20 mg daily     PREVIOUS   4. Prostate cancer/ overactive bladder: will continue hytrin 5 mg daily; urosatral 10 mg daily have stopped oxybutrin due to his recent fracture.   5. Renal insufficiency: is stable bun  36; creat 1.23   6. Hypokalemia: is without change  k+ 3.2 awaiting follow up labs.   7. Dyslipidemia: is stable will continue  fish oil 1 gm daily and will stop zocor   8. Chronic constipation: is worse: will change to miralax 17 gm daily; and will continue senna s daily as needed  9. Major depression recurrent chronic: is stable will continue effexor xr 75 mg daily take melatonin 10 mg nightly for sleep.   10. Acute blood loss as cause of postoperative anemia: hgb 10.7 is stable  will continue to monitor his status.   11. Interstitial lung disease; is stable will monitor is off 02.   12. Dementia without behavioral disturbance unspecified dementia type: is without change: weight is 215 pounds. Will continue namenda titration.      MD is aware of resident's narcotic use and is in agreement with current plan of care. We will attempt to wean resident as appropriate.  Ok Edwards NP [facility]  Contact [phone] Monday through Friday 8am- 5pm  After hours call [phone]

## 2020-03-22 NOTE — Progress Notes (Signed)
Chief Complaint  Patient presents with  . Follow-up    Recheck on right hip, DOS 03-10-20.   xrays are good   86 year old male doing well with therapy postop ORIF right hip for femoral neck fracture x-rays today show good fracture alignment and normal hardware  Follow-up 4 weeks repeat x-ray

## 2020-03-23 ENCOUNTER — Non-Acute Institutional Stay (SKILLED_NURSING_FACILITY): Payer: Medicare Other | Admitting: Adult Health

## 2020-03-23 ENCOUNTER — Other Ambulatory Visit (HOSPITAL_COMMUNITY)
Admission: RE | Admit: 2020-03-23 | Discharge: 2020-03-23 | Disposition: A | Discharge status: Home / Self Care | Payer: Medicare Other | Source: Skilled Nursing Facility | Attending: Adult Health | Admitting: Adult Health

## 2020-03-23 DIAGNOSIS — I10 Essential (primary) hypertension: Secondary | ICD-10-CM | POA: Diagnosis not present

## 2020-03-23 DIAGNOSIS — E876 Hypokalemia: Secondary | ICD-10-CM | POA: Insufficient documentation

## 2020-03-23 LAB — POTASSIUM: Potassium: 3 mmol/L — ABNORMAL LOW (ref 3.5–5.1)

## 2020-03-27 ENCOUNTER — Telehealth: Payer: Self-pay | Admitting: Orthopedic Surgery

## 2020-03-27 ENCOUNTER — Encounter: Payer: Self-pay | Admitting: Adult Health

## 2020-03-27 ENCOUNTER — Encounter (HOSPITAL_COMMUNITY)
Admission: RE | Admit: 2020-03-27 | Discharge: 2020-03-27 | Disposition: A | Discharge status: Home / Self Care | Payer: Medicare Other | Source: Skilled Nursing Facility | Attending: Adult Health | Admitting: Adult Health

## 2020-03-27 DIAGNOSIS — I10 Essential (primary) hypertension: Secondary | ICD-10-CM | POA: Insufficient documentation

## 2020-03-27 LAB — BASIC METABOLIC PANEL
Anion gap: 10 (ref 5–15)
BUN: 34 mg/dL — ABNORMAL HIGH (ref 8–23)
CO2: 27 mmol/L (ref 22–32)
Calcium: 9 mg/dL (ref 8.9–10.3)
Chloride: 105 mmol/L (ref 98–111)
Creatinine, Ser: 0.85 mg/dL (ref 0.61–1.24)
GFR, Estimated: >60 mL/min (ref >60)
Glucose, Bld: 133 mg/dL — ABNORMAL HIGH (ref 70–99)
Potassium: 3.6 mmol/L (ref 3.5–5.1)
Sodium: 142 mmol/L (ref 135–145)

## 2020-03-27 LAB — CORTISOL-AM, BLOOD: Cortisol - AM: 10.6 ug/dL (ref 6.7–22.6)

## 2020-03-27 NOTE — Progress Notes (Signed)
Location:   [facility] Room Number: 923 RAQTM of Service:  SNF (31)   CODE STATUS: full code   No Known Allergies  Chief Complaint  Patient presents with  . Acute Visit    follow up labs     HPI:  His k+ returned today at 3.0. he is presently taking k+ 20 meq daily. His blood pressure reading is good today. There are no reports of uncontrolled pain. No reports of dizziness represent. His appetite is good.   Past Medical History:  Diagnosis Date  . Anxiety   . Cancer [facility])    prostate  . Chronic chest wall pain   . Essential hypertension, benign 06/20/2016  . GERD (gastroesophageal reflux disease)   . H/O echocardiogram 2016   normal  . Heart murmur    "leaking heart valve"  . High cholesterol 06/20/2016  . HOH (hard of hearing)    left  . Poor historian   . PUD (peptic ulcer disease)   . Shortness of breath     Past Surgical History:  Procedure Laterality Date  . ESOPHAGOGASTRODUODENOSCOPY    . ESOPHAGOGASTRODUODENOSCOPY N/A 07/18/2016   Procedure: ESOPHAGOGASTRODUODENOSCOPY (EGD);  Surgeon: Rogene Houston, MD;  Location: [facility] ENDO [facility];  Service: Endoscopy;  Laterality: N/A;  3:00-moved to 230 per Lelon Frohlich  . HIP PINNING,CANNULATED Right 03/10/2020   Procedure: INTERNAL FIXATION RIGHT HIP;  Surgeon: Carole Civil, MD;  Location: [facility] ORS;  Service: Orthopedics;  Laterality: Right;  . ORIF WRIST FRACTURE  12/19/2011   Procedure: OPEN REDUCTION INTERNAL FIXATION (ORIF) WRIST FRACTURE;  Surgeon: Carole Civil, MD;  Location: [facility] ORS;  Service: Orthopedics;  Laterality: Right;  . prostate cancer     Diagnosed in the 90s  . TONSILLECTOMY      Social History   Socioeconomic History  . Marital status: Widowed    Spouse name: Not on file  . Number of children: Not on file  . Years of education: Not on file  . Highest education level: Not on file  Occupational History  . Occupation: retired    Fish farm manager: RETIRED  Tobacco Use  . Smoking status:  Never Smoker  . Smokeless tobacco: Never Used  Substance and Sexual Activity  . Alcohol use: No  . Drug use: No  . Sexual activity: Not on file  Other Topics Concern  . Not on file  Social History Narrative  . Not on file   Social Determinants of Health   Financial Resource Strain:   . Difficulty of Paying Living Expenses: Not on file  Food Insecurity:   . Worried About Charity fundraiser in the Last Year: Not on file  . Ran Out of Food in the Last Year: Not on file  Transportation Needs:   . Lack of Transportation (Medical): Not on file  . Lack of Transportation (Non-Medical): Not on file  Physical Activity:   . Days of Exercise per Week: Not on file  . Minutes of Exercise per Session: Not on file  Stress:   . Feeling of Stress : Not on file  Social Connections:   . Frequency of Communication with Friends and Family: Not on file  . Frequency of Social Gatherings with Friends and Family: Not on file  . Attends Religious Services: Not on file  . Active Member of Clubs or Organizations: Not on file  . Attends Archivist Meetings: Not on file  . Marital Status: Not on file  Intimate  Partner Violence:   . Fear of Current or Ex-Partner: Not on file  . Emotionally Abused: Not on file  . Physically Abused: Not on file  . Sexually Abused: Not on file   Family History  Problem Relation Age of Onset  . Cancer Other   . Heart attack Mother   . Cancer Brother   . Heart attack Brother   . Heart disease Sister        by pass      VITAL SIGNS BP 133/73   Pulse 80   Ht 5\' 9"  (1.753 m)   Wt 215 lb (97.5 kg)   BMI 31.75 kg/m   Outpatient Encounter Medications as of 03/23/2020  Medication Sig  . acetaminophen (TYLENOL) 500 MG tablet Take 1 tablet (500 mg total) by mouth 3 (three) times daily.  Marland Kitchen alfuzosin (UROXATRAL) 10 MG 24 hr tablet Take 10 mg by mouth daily with breakfast.  . Amino Acids-Protein Hydrolys (PRO-STAT PO) liquid; 15-100 gram-kcal/30 mL; amt: 30  ml; oral Special Instructions: for albumin 2.5 With Meals  . aspirin EC 325 MG EC tablet Take 325 mg daily thru 11/5 then resume 81 mg daily  . Balsam [place]-Castor Oil [facility]) OINT Special Instructions: Apply to bilateral buttocks & sacral area qshift for blanchable erythema.  . calcium carbonate (TUMS - DOSED IN MG ELEMENTAL CALCIUM) 500 MG chewable tablet Chew 1 tablet by mouth 2 (two) times daily as needed for indigestion or heartburn.   . diltiazem (CARDIZEM CD) 360 MG 24 hr capsule Take 360 mg by mouth daily. Start 03/23/2020  . ferrous sulfate 325 (65 FE) MG tablet Take 325 mg by mouth daily with breakfast. For Anemia and  Iron Deficiency  . Flax Oil-Fish Oil-Borage Oil (FISH OIL-FLAX OIL-BORAGE OIL) CAPS Take 1 capsule by mouth daily.  . hydrALAZINE (APRESOLINE) 50 MG tablet Take 50 mg by mouth 3 (three) times daily.  Marland Kitchen lisinopril (ZESTRIL) 40 MG tablet Take 1 tablet (40 mg total) by mouth daily.  . Melatonin 10 MG TABS Take 10 mg by mouth at bedtime.  . memantine (NAMENDA XR) 14 MG CP24 24 hr capsule Take 14 mg by mouth daily. For Dementia  . [START ON 04/02/2020] memantine (NAMENDA XR) 21 MG CP24 24 hr capsule Take 21 mg by mouth daily.  Derrill Memo ON 04/10/2020] memantine (NAMENDA XR) 28 MG CP24 24 hr capsule Take 28 mg by mouth daily.  . memantine (NAMENDA XR) 7 MG CP24 24 hr capsule Take 7 mg by mouth daily.  . NON FORMULARY Diet - Mechanical Soft  . omeprazole (PRILOSEC) 20 MG capsule Take 1 capsule (20 mg total) by mouth daily before breakfast.  . OXYGEN Inhale 2 L into the lungs continuous.  . polyethylene glycol (MIRALAX / GLYCOLAX) 17 g packet Take 17 g by mouth every other day.  . potassium chloride SA (KLOR-CON) 20 MEQ tablet Take 20 mEq by mouth daily.  Marland Kitchen senna-docusate (SENOKOT-S) 8.6-50 MG tablet Take 1 tablet by mouth at bedtime as needed for mild constipation.  Marland Kitchen terazosin (HYTRIN) 5 MG capsule Take 5 mg by mouth at bedtime.   Marland Kitchen venlafaxine XR (EFFEXOR-XR) 75 MG 24 hr  capsule Take 75 mg by mouth daily.  . vitamin C (ASCORBIC ACID) 500 MG tablet Take 1,000 mg by mouth daily.   No facility-administered encounter medications on file as of 03/23/2020.     SIGNIFICANT DIAGNOSTIC EXAMS   PREVIOUS   03-09-20: right hip x-ray: Minimally displaced right femoral neck fracture.  03-09-20: chest x-ray: Peribronchial thickening with streaky perihilar infiltrates suggesting bronchitis or multifocal pneumonia.  03-12-20: renal ultrasound: No acute abnormality. Bilateral renal cysts  03-12-20: chest x-ray: Lower lung volumes. No acute cardiopulmonary abnormality.   NO NEW EXAMS.   LABS REVIEWED PREVIOUS    03-09-20: wbc 13.7; hgb 12.1; hct 37.7; mcv 80.4 plt 205; glucose 208; bun 21; creat 1.50; k+ 3.2; na++ 141; ca 9.2; hgb a1c 5.9; HIV: nr 03-10-20: wbc 11.2; hgb 11.3; hct 35.9; mcv 82.7 plt 188; glucose 160; bun 18; creat 1.21; k+ 3.3; na++ 139; ca 8.8 liver normal 3.0; mag 2.3 03-12-20: wbc 10.4; hgb 9.3; hct 29.6; mcv 84.3 plt 178; glucose 138; bun 34; creat 1.77; k+ 3.8; na++ 138; ca 8.3 03-14-20: glucose 134; bun 36; creat 1.23; k+ 3.2; na++ 139; ca 8.8 mag 2.1   03-16-20: hgb 10.7; hct 33.4 glucose 127; bun 18; creat 0.86 k+ 3.3; na++ 139; ca 9.2 ;liver normal albumin 2.5 iron 23; tibc 221; ferritin 137  TODAY  03-23-20: k+ 3.0    Review of Systems  Unable to perform ROS: Dementia (unable to participate )    Physical Exam Constitutional:      General: He is not in acute distress.    Appearance: He is well-developed. He is not diaphoretic.  HENT:     Ears:     Comments: HOH L > R Neck:     Thyroid: No thyromegaly.  Cardiovascular:     Rate and Rhythm: Normal rate and regular rhythm.     Pulses: Normal pulses.     Heart sounds: Normal heart sounds.  Pulmonary:     Effort: Pulmonary effort is normal. No respiratory distress.     Breath sounds: Normal breath sounds.  Abdominal:     General: Bowel sounds are normal. There is no distension.      Palpations: Abdomen is soft.     Tenderness: There is no abdominal tenderness.  Musculoskeletal:     Cervical back: Neck supple.     Right lower leg: No edema.     Left lower leg: No edema.     Comments: Is able to move all extremities  Status post right hip internal fixation 03-10-20    Lymphadenopathy:     Cervical: No cervical adenopathy.  Skin:    General: Skin is warm and dry.  Neurological:     Mental Status: He is alert. Mental status is at baseline.  Psychiatric:        Mood and Affect: Mood normal.        ASSESSMENT/ PLAN:  TODAY  1. Hypokalemia worse 2. Essential hypertension stable  Will continue current anti-hypertensive medications Will change to k+ 20 meq twice daily  Will check cortisol and bmp on 03-27-20.   MD is aware of resident's narcotic use and is in agreement with current plan of care. We will attempt to wean resident as appropriate.  Ok Edwards NP [facility]  Contact [phone] Monday through Friday 8am- 5pm  After hours call [phone]

## 2020-03-27 NOTE — Telephone Encounter (Signed)
I have faxed it.  

## 2020-03-27 NOTE — Telephone Encounter (Signed)
Call from [place] at [facility] - [phone] - requests order for removal of staples; states was not done at appointment. Aware of next follow up 04/19/20.

## 2020-03-27 NOTE — Telephone Encounter (Signed)
Yes, fax# [phone] (I closed out of note prior to adding).  I can fax it.

## 2020-03-27 NOTE — Telephone Encounter (Signed)
Have op note, did she give fax number?

## 2020-03-29 ENCOUNTER — Encounter: Payer: Self-pay | Admitting: Adult Health

## 2020-03-29 ENCOUNTER — Non-Acute Institutional Stay (SKILLED_NURSING_FACILITY): Payer: Medicare Other | Admitting: Adult Health

## 2020-03-29 ENCOUNTER — Other Ambulatory Visit: Payer: Self-pay | Admitting: *Deleted

## 2020-03-29 DIAGNOSIS — E876 Hypokalemia: Secondary | ICD-10-CM | POA: Diagnosis not present

## 2020-03-29 DIAGNOSIS — N3281 Overactive bladder: Secondary | ICD-10-CM | POA: Diagnosis not present

## 2020-03-29 DIAGNOSIS — N289 Disorder of kidney and ureter, unspecified: Secondary | ICD-10-CM | POA: Diagnosis not present

## 2020-03-29 DIAGNOSIS — C61 Malignant neoplasm of prostate: Secondary | ICD-10-CM | POA: Diagnosis not present

## 2020-03-29 NOTE — Progress Notes (Signed)
Location:    [facility] Room Number: 143/P Place of Service:  SNF (31)   CODE STATUS: Full Code  No Known Allergies  Chief Complaint  Patient presents with   Short Term Rehab (STR)          Prostate cancer/overactive bladder:   Renal insufficiency  Hypokalemia:    Weekly follow up for the first 30 days post hospitalization.     HPI:  He is a 86 year old short term rehab patient being seen for the management of his chronic illnesses:Prostate cancer/overactive bladder:   Renal insufficiency  Hypokalemia. There are no reports of uncontrolled pain; no changes in appetite; no heart burn; does have urinary frequency.    Past Medical History:  Diagnosis Date   Anxiety    Cancer [facility])    prostate   Chronic chest wall pain    Essential hypertension, benign 06/20/2016   GERD (gastroesophageal reflux disease)    H/O echocardiogram 2016   normal   Heart murmur    "leaking heart valve"   High cholesterol 06/20/2016   HOH (hard of hearing)    left   Poor historian    PUD (peptic ulcer disease)    Shortness of breath     Past Surgical History:  Procedure Laterality Date   ESOPHAGOGASTRODUODENOSCOPY     ESOPHAGOGASTRODUODENOSCOPY N/A 07/18/2016   Procedure: ESOPHAGOGASTRODUODENOSCOPY (EGD);  Surgeon: Rogene Houston, MD;  Location: [facility] ENDO SUITE;  Service: Endoscopy;  Laterality: N/A;  3:00-moved to 230 per [place] Right 03/10/2020   Procedure: INTERNAL FIXATION RIGHT HIP;  Surgeon: Carole Civil, MD;  Location: [facility] ORS;  Service: Orthopedics;  Laterality: Right;   ORIF WRIST FRACTURE  12/19/2011   Procedure: OPEN REDUCTION INTERNAL FIXATION (ORIF) WRIST FRACTURE;  Surgeon: Carole Civil, MD;  Location: [facility] ORS;  Service: Orthopedics;  Laterality: Right;   prostate cancer     Diagnosed in the 90s   TONSILLECTOMY      Social History   Socioeconomic History   Marital status: Widowed    Spouse name: Not on file    Number of children: Not on file   Years of education: Not on file   Highest education level: Not on file  Occupational History   Occupation: retired    Fish farm manager: RETIRED  Tobacco Use   Smoking status: Never Smoker   Smokeless tobacco: Never Used  Substance and Sexual Activity   Alcohol use: No   Drug use: No   Sexual activity: Not on file  Other Topics Concern   Not on file  Social History Narrative   Not on file   Social Determinants of Health   Financial Resource Strain:    Difficulty of Paying Living Expenses: Not on file  Food Insecurity:    Worried About Charity fundraiser in the Last Year: Not on file   [employer] of Food in the Last Year: Not on file  Transportation Needs:    Lack of Transportation (Medical): Not on file   Lack of Transportation (Non-Medical): Not on file  Physical Activity:    Days of Exercise per Week: Not on file   Minutes of Exercise per Session: Not on file  Stress:    Feeling of Stress : Not on file  Social Connections:    Frequency of Communication with Friends and Family: Not on file   Frequency of Social Gatherings with Friends and Family: Not on file   Attends  Religious Services: Not on file   Active Member of Clubs or Organizations: Not on file   Attends Club or Organization Meetings: Not on file   Marital Status: Not on file  Intimate Partner Violence:    Fear of Current or Ex-Partner: Not on file   Emotionally Abused: Not on file   Physically Abused: Not on file   Sexually Abused: Not on file   Family History  Problem Relation Age of Onset   Cancer Other    Heart attack Mother    Cancer Brother    Heart attack Brother    Heart disease Sister        by pass      VITAL SIGNS BP (!) 170/76    Pulse 84    Temp (!) 97.2 F (36.2 C)    Resp 20    Ht 5\' 9"  (1.753 m)    Wt 215 lb 8 oz (97.8 kg)    SpO2 98%    BMI 31.82 kg/m   Outpatient Encounter Medications as of 03/29/2020  Medication Sig    acetaminophen (TYLENOL) 500 MG tablet Take 1,000 mg by mouth in the morning, at noon, and at bedtime.   alfuzosin (UROXATRAL) 10 MG 24 hr tablet Take 10 mg by mouth daily with breakfast.   Amino Acids-Protein Hydrolys (PRO-STAT PO) liquid; 15-100 gram-kcal/30 mL; amt: 30 ml; oral Special Instructions: for albumin 2.5 With Meals   aspirin EC 325 MG EC tablet Take 325 mg daily thru 11/5 then resume 81 mg daily   Balsam [place]-Castor Oil (VENELEX) OINT Special Instructions: Apply to bilateral buttocks & sacral area qshift for blanchable erythema.   calcium carbonate (TUMS - DOSED IN MG ELEMENTAL CALCIUM) 500 MG chewable tablet Chew 1 tablet by mouth 2 (two) times daily as needed for indigestion or heartburn.    diltiazem (CARDIZEM CD) 360 MG 24 hr capsule Take 360 mg by mouth daily. Start 03/23/2020   ferrous sulfate 325 (65 FE) MG tablet Take 325 mg by mouth daily with breakfast. For Anemia and  Iron Deficiency   Flax Oil-Fish Oil-Borage Oil (FISH OIL-FLAX OIL-BORAGE OIL) CAPS Take 1 capsule by mouth daily.   hydrALAZINE (APRESOLINE) 50 MG tablet Take 100 mg by mouth 3 (three) times daily.    lisinopril (ZESTRIL) 40 MG tablet Take 1 tablet (40 mg total) by mouth daily.   Melatonin 10 MG TABS Take 10 mg by mouth at bedtime.   memantine (NAMENDA XR) 14 MG CP24 24 hr capsule Take 14 mg by mouth daily. For Dementia   [START ON 04/02/2020] memantine (NAMENDA XR) 21 MG CP24 24 hr capsule Take 21 mg by mouth daily.   [START ON 04/10/2020] memantine (NAMENDA XR) 28 MG CP24 24 hr capsule Take 28 mg by mouth daily.   NON FORMULARY Diet - Mechanical Soft   omeprazole (PRILOSEC) 20 MG capsule Take 1 capsule (20 mg total) by mouth daily before breakfast.   OXYGEN Inhale 2 L into the lungs continuous.   polyethylene glycol (MIRALAX / GLYCOLAX) 17 g packet Take 17 g by mouth every other day.   potassium chloride SA (KLOR-CON) 20 MEQ tablet Take 20 mEq by mouth 2 (two) times daily.     senna-docusate (SENOKOT-S) 8.6-50 MG tablet Take 1 tablet by mouth at bedtime as needed for mild constipation.   terazosin (HYTRIN) 5 MG capsule Take 5 mg by mouth at bedtime.    venlafaxine XR (EFFEXOR-XR) 75 MG 24 hr capsule Take 75 mg by mouth  daily.   vitamin C (ASCORBIC ACID) 500 MG tablet Take 1,000 mg by mouth daily.   No facility-administered encounter medications on file as of 03/29/2020.     SIGNIFICANT DIAGNOSTIC EXAMS   PREVIOUS   03-09-20: right hip x-ray: Minimally displaced right femoral neck fracture.  03-09-20: chest x-ray: Peribronchial thickening with streaky perihilar infiltrates suggesting bronchitis or multifocal pneumonia.  03-12-20: renal ultrasound: No acute abnormality. Bilateral renal cysts  03-12-20: chest x-ray: Lower lung volumes. No acute cardiopulmonary abnormality.   NO NEW EXAMS.   LABS REVIEWED PREVIOUS    03-09-20: wbc 13.7; hgb 12.1; hct 37.7; mcv 80.4 plt 205; glucose 208; bun 21; creat 1.50; k+ 3.2; na++ 141; ca 9.2; hgb a1c 5.9; HIV: nr 03-10-20: wbc 11.2; hgb 11.3; hct 35.9; mcv 82.7 plt 188; glucose 160; bun 18; creat 1.21; k+ 3.3; na++ 139; ca 8.8 liver normal 3.0; mag 2.3 03-12-20: wbc 10.4; hgb 9.3; hct 29.6; mcv 84.3 plt 178; glucose 138; bun 34; creat 1.77; k+ 3.8; na++ 138; ca 8.3 03-14-20: glucose 134; bun 36; creat 1.23; k+ 3.2; na++ 139; ca 8.8 mag 2.1   03-16-20: hgb 10.7; hct 33.4 glucose 127; bun 18; creat 0.86 k+ 3.3; na++ 139; ca 9.2 ;liver normal albumin 2.5 iron 23; tibc 221; ferritin 137 03-23-20: k+ 3.0   TODAY  03-27-20: glucose 133; bun 34; creat 0.85; k+ 3.6; na++ 142; ca 9.0; AM cortisol: 10.6    Review of Systems  Constitutional: Negative for malaise/fatigue.  Respiratory: Negative for cough and shortness of breath.   Cardiovascular: Negative for chest pain, palpitations and leg swelling.  Gastrointestinal: Negative for abdominal pain, constipation and heartburn.  Musculoskeletal: Negative for back pain, joint pain  and myalgias.  Skin: Negative.   Neurological: Negative for dizziness.  Psychiatric/Behavioral: The patient is not nervous/anxious.     Physical Exam Constitutional:      General: He is not in acute distress.    Appearance: He is well-developed. He is not diaphoretic.  HENT:     Ears:     Comments: HOH L>R Neck:     Thyroid: No thyromegaly.  Cardiovascular:     Rate and Rhythm: Normal rate and regular rhythm.     Pulses: Normal pulses.     Heart sounds: Normal heart sounds.  Pulmonary:     Effort: Pulmonary effort is normal. No respiratory distress.     Breath sounds: Normal breath sounds.  Abdominal:     General: Bowel sounds are normal. There is no distension.     Palpations: Abdomen is soft.     Tenderness: There is no abdominal tenderness.  Musculoskeletal:     Cervical back: Neck supple.     Right lower leg: No edema.     Left lower leg: No edema.     Comments: Is able to move all extremities  Status post right hip internal fixation 03-10-20  Lymphadenopathy:     Cervical: No cervical adenopathy.  Skin:    General: Skin is warm and dry.  Neurological:     Mental Status: He is alert. Mental status is at baseline.  Psychiatric:        Mood and Affect: Mood normal.       ASSESSMENT/ PLAN:  TODAY  1. Prostate cancer/overactive bladder: is stable will continue hytrin 5 mg daily urosatral 10 mg daily is off oxybutrin due to fracture  2. Renal insufficiency is stable bun 34; creat 0.85  3. Hypokalemia: is stable k+ 3.6 will continue k+  20 meq twice daily    PREVIOUS   4. Dyslipidemia: is stable will continue  fish oil 1 gm daily and is off zocor   5. Chronic constipation: is worse: will change to miralax 17 gm daily; and will continue senna s daily as needed  6. Major depression recurrent chronic: is stable will continue effexor xr 75 mg daily take melatonin 10 mg nightly for sleep.   9. Acute blood loss as cause of postoperative anemia: hgb 10.7 is stable   will continue to monitor his status.   10. Interstitial lung disease; is stable will monitor is off 02.   11. Dementia without behavioral disturbance unspecified dementia type: is without change: weight is 215 pounds. Will continue namenda titration.   12.  Closed displaced fracture of right femoral neck: is stable is status post right internal fixation 03-10-20; will continue therapy as directed and will follow up with orthopedics; will continue tylenol 1000 mg three times daily asa 325 mg daily through 04-14-20  13. Essential hypertension is without change : b/p 170/76: will continue lisinopril 40 mg daily hytrin 5 mg daily  hydralazine 100 mg three times daily and cardizem cd 360 mg daily   14. Gastroesophageal reflux disease without esophagitis: is stable will continue prilosec 20 mg daily     MD is aware of resident's narcotic use and is in agreement with current plan of care. We will attempt to wean resident as appropriate.  Ok Edwards NP [facility]  Contact [phone] Monday through Friday 8am- 5pm  After hours call [phone]

## 2020-03-29 NOTE — Patient Outreach (Signed)
Member screened for potential [facility] Care Management needs as a benefit of [place] [employer].  Per Patient Jeffrey Sherman member resides in [place].   Communication sent to [place] [facility] SW to collaborate about anticipated dc plans and potential [facility] Care Management needs.  Will continue to follow while member resides in SNF.   Marthenia Rolling, MSN-Ed, RN,BSN [place] Acute Care Coordinator [phone] [facility]) [phone]  (Toll free office)

## 2020-03-30 ENCOUNTER — Encounter: Payer: Self-pay | Admitting: Adult Health

## 2020-03-30 ENCOUNTER — Non-Acute Institutional Stay (SKILLED_NURSING_FACILITY): Payer: Medicare Other | Admitting: Adult Health

## 2020-03-30 DIAGNOSIS — S72001A Fracture of unspecified part of neck of right femur, initial encounter for closed fracture: Secondary | ICD-10-CM | POA: Diagnosis not present

## 2020-03-30 DIAGNOSIS — F0390 Unspecified dementia without behavioral disturbance: Secondary | ICD-10-CM

## 2020-03-30 DIAGNOSIS — J849 Interstitial pulmonary disease, unspecified: Secondary | ICD-10-CM | POA: Diagnosis not present

## 2020-03-30 NOTE — Progress Notes (Signed)
Location:    [facility] Room Number: 143/P Place of Service:  SNF (31)   CODE STATUS: DNR  No Known Allergies  Chief Complaint  Patient presents with   Acute Visit    Care Plan Meeting    HPI:  We have come together for her care plan meeting. Family present. BIMS 13/15 mood 8/30 with decreased energy and appetite. Weight is stable appetite is good is on supplements. There have been no recent falls. He has a blister on his right heel. He is non-ambulatory. He requires extensive assist with adls does feed himself. Is frequently incontinent of bladder and bowel. Presently this could represent a long term placement for him. He denies any uncontrolled pain. He continues to be followed for his chronic illnesses including:  Dementia without behavioral disturbance unspecified dementia type Interstitial lung disease Closed displaced fracture of right femoral neck  Past Medical History:  Diagnosis Date   Anxiety    Cancer [facility])    prostate   Chronic chest wall pain    Essential hypertension, benign 06/20/2016   GERD (gastroesophageal reflux disease)    H/O echocardiogram 2016   normal   Heart murmur    "leaking heart valve"   High cholesterol 06/20/2016   HOH (hard of hearing)    left   Poor historian    PUD (peptic ulcer disease)    Shortness of breath     Past Surgical History:  Procedure Laterality Date   ESOPHAGOGASTRODUODENOSCOPY     ESOPHAGOGASTRODUODENOSCOPY N/A 07/18/2016   Procedure: ESOPHAGOGASTRODUODENOSCOPY (EGD);  Surgeon: Rogene Houston, MD;  Location: [facility] ENDO SUITE;  Service: Endoscopy;  Laterality: N/A;  3:00-moved to 230 per [place] Right 03/10/2020   Procedure: INTERNAL FIXATION RIGHT HIP;  Surgeon: Carole Civil, MD;  Location: [facility] ORS;  Service: Orthopedics;  Laterality: Right;   ORIF WRIST FRACTURE  12/19/2011   Procedure: OPEN REDUCTION INTERNAL FIXATION (ORIF) WRIST FRACTURE;  Surgeon: Carole Civil, MD;  Location: [facility] ORS;  Service: Orthopedics;  Laterality: Right;   prostate cancer     Diagnosed in the 90s   TONSILLECTOMY      Social History   Socioeconomic History   Marital status: Widowed    Spouse name: Not on file   Number of children: Not on file   Years of education: Not on file   Highest education level: Not on file  Occupational History   Occupation: retired    Fish farm manager: RETIRED  Tobacco Use   Smoking status: Never Smoker   Smokeless tobacco: Never Used  Substance and Sexual Activity   Alcohol use: No   Drug use: No   Sexual activity: Not on file  Other Topics Concern   Not on file  Social History Narrative   Not on file   Social Determinants of Health   Financial Resource Strain:    Difficulty of Paying Living Expenses: Not on file  Food Insecurity:    Worried About Charity fundraiser in the Last Year: Not on file   [employer] of Food in the Last Year: Not on file  Transportation Needs:    Lack of Transportation (Medical): Not on file   Lack of Transportation (Non-Medical): Not on file  Physical Activity:    Days of Exercise per Week: Not on file   Minutes of Exercise per Session: Not on file  Stress:    Feeling of Stress : Not on file  Social Connections:  Frequency of Communication with Friends and Family: Not on file   Frequency of Social Gatherings with Friends and Family: Not on file   Attends Religious Services: Not on file   Active Member of Clubs or Organizations: Not on file   Attends Archivist Meetings: Not on file   Marital Status: Not on file  Intimate Partner Violence:    Fear of Current or Ex-Partner: Not on file   Emotionally Abused: Not on file   Physically Abused: Not on file   Sexually Abused: Not on file   Family History  Problem Relation Age of Onset   Cancer Other    Heart attack Mother    Cancer Brother    Heart attack Brother    Heart disease Sister        by  pass      VITAL SIGNS BP (!) 178/73    Pulse 82    Temp (!) 96.8 F (36 C)    Resp 20    Ht 5\' 9"  (1.753 m)    Wt 199 lb (90.3 kg)    SpO2 95%    BMI 29.39 kg/m   Outpatient Encounter Medications as of 03/30/2020  Medication Sig   acetaminophen (TYLENOL) 500 MG tablet Take 1,000 mg by mouth in the morning, at noon, and at bedtime.   alfuzosin (UROXATRAL) 10 MG 24 hr tablet Take 10 mg by mouth daily with breakfast.   Amino Acids-Protein Hydrolys (PRO-STAT PO) liquid; 15-100 gram-kcal/30 mL; amt: 30 ml; oral Special Instructions: for albumin 2.5 With Meals   aspirin EC 325 MG EC tablet Take 325 mg daily thru 11/5 then resume 81 mg daily   Balsam [place]-Castor Oil (VENELEX) OINT Special Instructions: Apply to bilateral buttocks & sacral area qshift for blanchable erythema.   calcium carbonate (TUMS - DOSED IN MG ELEMENTAL CALCIUM) 500 MG chewable tablet Chew 1 tablet by mouth 2 (two) times daily as needed for indigestion or heartburn.    diltiazem (CARDIZEM CD) 360 MG 24 hr capsule Take 360 mg by mouth daily. Start 03/23/2020   ferrous sulfate 325 (65 FE) MG tablet Take 325 mg by mouth daily with breakfast. For Anemia and  Iron Deficiency   Flax Oil-Fish Oil-Borage Oil (FISH OIL-FLAX OIL-BORAGE OIL) CAPS Take 1 capsule by mouth daily.   hydrALAZINE (APRESOLINE) 50 MG tablet Take 100 mg by mouth 3 (three) times daily.    lisinopril (ZESTRIL) 40 MG tablet Take 1 tablet (40 mg total) by mouth daily.   Melatonin 10 MG TABS Take 10 mg by mouth at bedtime.   memantine (NAMENDA XR) 14 MG CP24 24 hr capsule Take 14 mg by mouth daily. For Dementia   [START ON 04/02/2020] memantine (NAMENDA XR) 21 MG CP24 24 hr capsule Take 21 mg by mouth daily.   [START ON 04/10/2020] memantine (NAMENDA XR) 28 MG CP24 24 hr capsule Take 28 mg by mouth daily.   NON FORMULARY Diet - Mechanical Soft   omeprazole (PRILOSEC) 20 MG capsule Take 1 capsule (20 mg total) by mouth daily before breakfast.    OXYGEN Inhale 2 L into the lungs continuous.   polyethylene glycol (MIRALAX / GLYCOLAX) 17 g packet Take 17 g by mouth every other day.   potassium chloride SA (KLOR-CON) 20 MEQ tablet Take 20 mEq by mouth 2 (two) times daily.    senna-docusate (SENOKOT-S) 8.6-50 MG tablet Take 1 tablet by mouth at bedtime as needed for mild constipation.   terazosin (HYTRIN) 5 MG  capsule Take 5 mg by mouth at bedtime.    venlafaxine XR (EFFEXOR-XR) 75 MG 24 hr capsule Take 75 mg by mouth daily.   vitamin C (ASCORBIC ACID) 500 MG tablet Take 1,000 mg by mouth daily.   No facility-administered encounter medications on file as of 03/30/2020.     SIGNIFICANT DIAGNOSTIC EXAMS   PREVIOUS   03-09-20: right hip x-ray: Minimally displaced right femoral neck fracture.  03-09-20: chest x-ray: Peribronchial thickening with streaky perihilar infiltrates suggesting bronchitis or multifocal pneumonia.  03-12-20: renal ultrasound: No acute abnormality. Bilateral renal cysts  03-12-20: chest x-ray: Lower lung volumes. No acute cardiopulmonary abnormality.   NO NEW EXAMS.   LABS REVIEWED PREVIOUS    03-09-20: wbc 13.7; hgb 12.1; hct 37.7; mcv 80.4 plt 205; glucose 208; bun 21; creat 1.50; k+ 3.2; na++ 141; ca 9.2; hgb a1c 5.9; HIV: nr 03-10-20: wbc 11.2; hgb 11.3; hct 35.9; mcv 82.7 plt 188; glucose 160; bun 18; creat 1.21; k+ 3.3; na++ 139; ca 8.8 liver normal 3.0; mag 2.3 03-12-20: wbc 10.4; hgb 9.3; hct 29.6; mcv 84.3 plt 178; glucose 138; bun 34; creat 1.77; k+ 3.8; na++ 138; ca 8.3 03-14-20: glucose 134; bun 36; creat 1.23; k+ 3.2; na++ 139; ca 8.8 mag 2.1   03-16-20: hgb 10.7; hct 33.4 glucose 127; bun 18; creat 0.86 k+ 3.3; na++ 139; ca 9.2 ;liver normal albumin 2.5 iron 23; tibc 221; ferritin 137 03-23-20: k+ 3.0  03-27-20: glucose 133; bun 34; creat 0.85; k+ 3.6; na++ 142; ca 9.0; AM cortisol: 10.6  NO NEW LABS.     Review of Systems  Constitutional: Negative for malaise/fatigue.  Respiratory: Negative  for cough and shortness of breath.   Cardiovascular: Negative for chest pain, palpitations and leg swelling.  Gastrointestinal: Negative for abdominal pain, constipation and heartburn.  Musculoskeletal: Negative for back pain, joint pain and myalgias.  Skin: Negative.   Neurological: Negative for dizziness.  Psychiatric/Behavioral: The patient is not nervous/anxious.     Physical Exam Constitutional:      General: He is not in acute distress.    Appearance: He is well-developed. He is not diaphoretic.  HENT:     Ears:     Comments: HOH L>R Neck:     Thyroid: No thyromegaly.  Cardiovascular:     Rate and Rhythm: Normal rate and regular rhythm.     Pulses: Normal pulses.     Heart sounds: Normal heart sounds.  Pulmonary:     Effort: Pulmonary effort is normal. No respiratory distress.     Breath sounds: Normal breath sounds.  Abdominal:     General: Bowel sounds are normal. There is no distension.     Palpations: Abdomen is soft.     Tenderness: There is no abdominal tenderness.  Musculoskeletal:     Cervical back: Neck supple.     Right lower leg: No edema.     Left lower leg: No edema.     Comments: Is able to move all extremities  Status post right hip internal fixation 03-10-20  Lymphadenopathy:     Cervical: No cervical adenopathy.  Skin:    General: Skin is warm and dry.     Comments: Right heel blister: 2.6 x 2.6 cm  Neurological:     Mental Status: He is alert and oriented to person, place, and time.  Psychiatric:        Mood and Affect: Mood normal.       ASSESSMENT/ PLAN:  TODAY  1. Dementia  without behavioral disturbance unspecified dementia type 2. Interstitial lung disease 3. Closed displaced fracture of right femoral neck  Will continue current medication Will continue current plan of care Will continue to monitor his status.     MD is aware of resident's narcotic use and is in agreement with current plan of care. We will attempt to wean  resident as appropriate.  Ok Edwards NP [facility] Adult Medicine  Contact [phone] Monday through Friday 8am- 5pm  After hours call [phone]

## 2020-03-31 ENCOUNTER — Other Ambulatory Visit: Payer: Self-pay | Admitting: *Deleted

## 2020-03-31 NOTE — Patient Outreach (Signed)
[facility] Post- Acute Care Coordinator follow up. Member screened for potential [facility] Care Management needs as a benefit of [place] [employer].  Update previously received from [place] indicating transition plan is for member to return home with son and daughter in law.  Writer will continue to follow for potential [facility] Care Management needs while member resides in SNF.    Marthenia Rolling, MSN-Ed, RN,BSN [place] Acute Care Coordinator [phone] [facility]) [phone]  (Toll free office)

## 2020-04-04 ENCOUNTER — Non-Acute Institutional Stay (SKILLED_NURSING_FACILITY): Payer: Medicare Other | Admitting: Adult Health

## 2020-04-04 ENCOUNTER — Encounter: Payer: Self-pay | Admitting: Adult Health

## 2020-04-04 DIAGNOSIS — E785 Hyperlipidemia, unspecified: Secondary | ICD-10-CM | POA: Diagnosis not present

## 2020-04-04 DIAGNOSIS — F339 Major depressive disorder, recurrent, unspecified: Secondary | ICD-10-CM | POA: Diagnosis not present

## 2020-04-04 DIAGNOSIS — K5909 Other constipation: Secondary | ICD-10-CM

## 2020-04-04 NOTE — Progress Notes (Signed)
Location:    [place] Room Number: 156/D Place of Service:  SNF (31)   CODE STATUS: DNR  No Known Allergies  Chief Complaint  Patient presents with  . Short Term Rehab (STR)         Dyslipidemia:    Chronic constipation:  Major depression recurrent chronic     Weekly follow up for the first 30 days post hospitalization.     HPI:  He is a 86 year old short term rehab patient being seen for the management of his chronic illnesses: Dyslipidemia:    Chronic constipation:  Major depression recurrent chronic. He continues to participate in therapy. There are no reports of uncontrolled pain; no heart burn; no constipation. No anxiety or depressive thoughts.   Past Medical History:  Diagnosis Date  . Anxiety   . Cancer [facility])    prostate  . Chronic chest wall pain   . Essential hypertension, benign 06/20/2016  . GERD (gastroesophageal reflux disease)   . H/O echocardiogram 2016   normal  . Heart murmur    "leaking heart valve"  . High cholesterol 06/20/2016  . HOH (hard of hearing)    left  . Poor historian   . PUD (peptic ulcer disease)   . Shortness of breath     Past Surgical History:  Procedure Laterality Date  . ESOPHAGOGASTRODUODENOSCOPY    . ESOPHAGOGASTRODUODENOSCOPY N/A 07/18/2016   Procedure: ESOPHAGOGASTRODUODENOSCOPY (EGD);  Surgeon: Rogene Houston, MD;  Location: [facility] ENDO [facility];  Service: Endoscopy;  Laterality: N/A;  3:00-moved to 230 per Lelon Frohlich  . HIP PINNING,CANNULATED Right 03/10/2020   Procedure: INTERNAL FIXATION RIGHT HIP;  Surgeon: Carole Civil, MD;  Location: [facility] ORS;  Service: Orthopedics;  Laterality: Right;  . ORIF WRIST FRACTURE  12/19/2011   Procedure: OPEN REDUCTION INTERNAL FIXATION (ORIF) WRIST FRACTURE;  Surgeon: Carole Civil, MD;  Location: [facility] ORS;  Service: Orthopedics;  Laterality: Right;  . prostate cancer     Diagnosed in the 90s  . TONSILLECTOMY      Social History   Socioeconomic History  . Marital  status: Widowed    Spouse name: Not on file  . Number of children: Not on file  . Years of education: Not on file  . Highest education level: Not on file  Occupational History  . Occupation: retired    Fish farm manager: RETIRED  Tobacco Use  . Smoking status: Never Smoker  . Smokeless tobacco: Never Used  Substance and Sexual Activity  . Alcohol use: No  . Drug use: No  . Sexual activity: Not on file  Other Topics Concern  . Not on file  Social History Narrative  . Not on file   Social Determinants of Health   Financial Resource Strain:   . Difficulty of Paying Living Expenses: Not on file  Food Insecurity:   . Worried About Charity fundraiser in the Last Year: Not on file  . Ran Out of Food in the Last Year: Not on file  Transportation Needs:   . Lack of Transportation (Medical): Not on file  . Lack of Transportation (Non-Medical): Not on file  Physical Activity:   . Days of Exercise per Week: Not on file  . Minutes of Exercise per Session: Not on file  Stress:   . Feeling of Stress : Not on file  Social Connections:   . Frequency of Communication with Friends and Family: Not on file  . Frequency of Social Gatherings with Friends  and Family: Not on file  . Attends Religious Services: Not on file  . Active Member of Clubs or Organizations: Not on file  . Attends Archivist Meetings: Not on file  . Marital Status: Not on file  Intimate Partner Violence:   . Fear of Current or Ex-Partner: Not on file  . Emotionally Abused: Not on file  . Physically Abused: Not on file  . Sexually Abused: Not on file   Family History  Problem Relation Age of Onset  . Cancer Other   . Heart attack Mother   . Cancer Brother   . Heart attack Brother   . Heart disease Sister        by pass      VITAL SIGNS BP 140/67   Pulse 80   Temp 98.2 F (36.8 C)   Resp 20   Ht 5\' 9"  (1.753 m)   Wt 196 lb 6.4 oz (89.1 kg)   SpO2 96%   BMI 29.00 kg/m   Outpatient Encounter  Medications as of 04/04/2020  Medication Sig  . acetaminophen (TYLENOL) 500 MG tablet Take 1,000 mg by mouth in the morning, at noon, and at bedtime.  Marland Kitchen alfuzosin (UROXATRAL) 10 MG 24 hr tablet Take 10 mg by mouth daily with breakfast.  . Amino Acids-Protein Hydrolys (PRO-STAT PO) liquid; 15-100 gram-kcal/30 mL; amt: 30 ml; oral Special Instructions: for albumin 2.5 With Meals  . aspirin EC 325 MG EC tablet Take 325 mg daily thru 11/5 then resume 81 mg daily  . Balsam [place]-Castor Oil [facility]) OINT Special Instructions: Apply to bilateral buttocks & sacral area qshift for blanchable erythema.  . calcium carbonate (TUMS - DOSED IN MG ELEMENTAL CALCIUM) 500 MG chewable tablet Chew 1 tablet by mouth 2 (two) times daily as needed for indigestion or heartburn.   . diltiazem (CARDIZEM CD) 360 MG 24 hr capsule Take 360 mg by mouth daily. Start 03/23/2020  . ferrous sulfate 325 (65 FE) MG tablet Take 325 mg by mouth daily with breakfast. For Anemia and  Iron Deficiency  . Flax Oil-Fish Oil-Borage Oil (FISH OIL-FLAX OIL-BORAGE OIL) CAPS Take 1 capsule by mouth daily.  . hydrALAZINE (APRESOLINE) 50 MG tablet Take 100 mg by mouth 3 (three) times daily.   Marland Kitchen lisinopril (ZESTRIL) 40 MG tablet Take 1 tablet (40 mg total) by mouth daily.  . Melatonin 10 MG TABS Take 10 mg by mouth at bedtime.  . memantine (NAMENDA XR) 21 MG CP24 24 hr capsule Take 21 mg by mouth daily.  Derrill Memo ON 04/10/2020] memantine (NAMENDA XR) 28 MG CP24 24 hr capsule Take 28 mg by mouth daily.  . NON FORMULARY Diet - Mechanical Soft  . omeprazole (PRILOSEC) 20 MG capsule Take 1 capsule (20 mg total) by mouth daily before breakfast.  . OXYGEN Inhale 2 L into the lungs continuous.  . polyethylene glycol (MIRALAX / GLYCOLAX) 17 g packet Take 17 g by mouth every other day.  . potassium chloride SA (KLOR-CON) 20 MEQ tablet Take 20 mEq by mouth 2 (two) times daily.   Marland Kitchen senna-docusate (SENOKOT-S) 8.6-50 MG tablet Take 1 tablet by mouth at  bedtime as needed for mild constipation.  Marland Kitchen terazosin (HYTRIN) 5 MG capsule Take 5 mg by mouth at bedtime.   Marland Kitchen venlafaxine XR (EFFEXOR-XR) 75 MG 24 hr capsule Take 75 mg by mouth daily.  . vitamin C (ASCORBIC ACID) 500 MG tablet Take 1,000 mg by mouth daily.  . [DISCONTINUED] memantine (NAMENDA XR) 14  MG CP24 24 hr capsule Take 14 mg by mouth daily. For Dementia   No facility-administered encounter medications on file as of 04/04/2020.     SIGNIFICANT DIAGNOSTIC EXAMS   PREVIOUS   03-09-20: right hip x-ray: Minimally displaced right femoral neck fracture.  03-09-20: chest x-ray: Peribronchial thickening with streaky perihilar infiltrates suggesting bronchitis or multifocal pneumonia.  03-12-20: renal ultrasound: No acute abnormality. Bilateral renal cysts  03-12-20: chest x-ray: Lower lung volumes. No acute cardiopulmonary abnormality.   NO NEW EXAMS.   LABS REVIEWED PREVIOUS    03-09-20: wbc 13.7; hgb 12.1; hct 37.7; mcv 80.4 plt 205; glucose 208; bun 21; creat 1.50; k+ 3.2; na++ 141; ca 9.2; hgb a1c 5.9; HIV: nr 03-10-20: wbc 11.2; hgb 11.3; hct 35.9; mcv 82.7 plt 188; glucose 160; bun 18; creat 1.21; k+ 3.3; na++ 139; ca 8.8 liver normal 3.0; mag 2.3 03-12-20: wbc 10.4; hgb 9.3; hct 29.6; mcv 84.3 plt 178; glucose 138; bun 34; creat 1.77; k+ 3.8; na++ 138; ca 8.3 03-14-20: glucose 134; bun 36; creat 1.23; k+ 3.2; na++ 139; ca 8.8 mag 2.1   03-16-20: hgb 10.7; hct 33.4 glucose 127; bun 18; creat 0.86 k+ 3.3; na++ 139; ca 9.2 ;liver normal albumin 2.5 iron 23; tibc 221; ferritin 137 03-23-20: k+ 3.0  03-27-20: glucose 133; bun 34; creat 0.85; k+ 3.6; na++ 142; ca 9.0; AM cortisol: 10.6   NO NEW LABS.    Review of Systems  Constitutional: Negative for malaise/fatigue.  Respiratory: Negative for cough and shortness of breath.   Cardiovascular: Negative for chest pain, palpitations and leg swelling.  Gastrointestinal: Negative for abdominal pain, constipation and heartburn.   Musculoskeletal: Negative for back pain, joint pain and myalgias.  Skin: Negative.   Neurological: Negative for dizziness.  Psychiatric/Behavioral: The patient is not nervous/anxious.       Physical Exam Constitutional:      General: He is not in acute distress.    Appearance: He is well-developed. He is not diaphoretic.  HENT:     Ears:     Comments: HOH L>R Neck:     Thyroid: No thyromegaly.  Cardiovascular:     Rate and Rhythm: Normal rate and regular rhythm.     Pulses: Normal pulses.     Heart sounds: Normal heart sounds.  Pulmonary:     Effort: Pulmonary effort is normal. No respiratory distress.     Breath sounds: Normal breath sounds.  Abdominal:     General: Bowel sounds are normal. There is no distension.     Palpations: Abdomen is soft.     Tenderness: There is no abdominal tenderness.  Musculoskeletal:     Cervical back: Neck supple.     Right lower leg: No edema.     Left lower leg: No edema.     Comments:  Is able to move all extremities  Status post right hip internal fixation 03-10-20   Lymphadenopathy:     Cervical: No cervical adenopathy.  Skin:    General: Skin is warm and dry.     Comments: Right heel blister: 2.6 x 2.6 cm   Neurological:     Mental Status: He is alert and oriented to person, place, and time.  Psychiatric:        Mood and Affect: Mood normal.     ASSESSMENT/ PLAN:  TODAY  1. Dyslipidemia: is stable will continue fish oil 1 gm daily is off zocor  2. Chronic constipation: is stable will continue miralax daily and senna  s daily as needed  3. Major depression recurrent chronic: is stable will continue effexor xr 75 mg daily melatonin 10 mg nightly for sleep.     PREVIOUS   4. Acute blood loss as cause of postoperative anemia: hgb 10.7 is stable  will continue to monitor his status.   5. Interstitial lung disease; is stable will monitor is off 02.   6. Dementia without behavioral disturbance unspecified dementia type: is  without change: weight is 196 pounds. Will continue namenda titration.   7.  Closed displaced fracture of right femoral neck: is stable is status post right internal fixation 03-10-20; will continue therapy as directed and will follow up with orthopedics; will continue tylenol 1000 mg three times daily asa 325 mg daily through 04-14-20  8. Essential hypertension is without change : b/p 140/67: will continue lisinopril 40 mg daily hytrin 5 mg daily  hydralazine 100 mg three times daily and cardizem cd 360 mg daily   9. Gastroesophageal reflux disease without esophagitis: is stable will continue prilosec 20 mg daily   10. Prostate cancer/overactive bladder: is stable will continue hytrin 5 mg daily urosatral 10 mg daily is off oxybutrin due to fracture  11. Renal insufficiency is stable bun 34; creat 0.85  12. Hypokalemia: is stable k+ 3.6 will continue k+ 20 meq twice daily        MD is aware of resident's narcotic use and is in agreement with current plan of care. We will attempt to wean resident as appropriate.  Ok Edwards NP [facility]  Contact [phone] Monday through Friday 8am- 5pm  After hours call [phone]

## 2020-04-11 ENCOUNTER — Encounter: Payer: Self-pay | Admitting: Adult Health

## 2020-04-11 ENCOUNTER — Non-Acute Institutional Stay (SKILLED_NURSING_FACILITY): Payer: Medicare Other | Admitting: Adult Health

## 2020-04-11 DIAGNOSIS — D62 Acute posthemorrhagic anemia: Secondary | ICD-10-CM | POA: Diagnosis not present

## 2020-04-11 DIAGNOSIS — F0390 Unspecified dementia without behavioral disturbance: Secondary | ICD-10-CM | POA: Diagnosis not present

## 2020-04-11 DIAGNOSIS — J849 Interstitial pulmonary disease, unspecified: Secondary | ICD-10-CM | POA: Diagnosis not present

## 2020-04-11 NOTE — Progress Notes (Signed)
Location:    [place] Room Number: 156/D Place of Service:  SNF (31)   CODE STATUS: DNR  No Known Allergies  Chief Complaint  Patient presents with  . Medical Management of Chronic Issues           Acute blood loss as cause of postoperative anemia:    Interstitial lung disease   Dementia without behavioral disturbance unspecified dementia type:    HPI:  He is a 86 year old long term resident of this facility being seen for the management of his chronic illnesses; Acute blood loss as cause of postoperative anemia:    Interstitial lung disease   Dementia without behavioral disturbance unspecified dementia type. There are no reports of uncontrolled pain. He continues to have urinary frequency; is unable to take oxybutrin due to his recent fracture. No reports of constipation or heart burn.   Past Medical History:  Diagnosis Date  . Anxiety   . Cancer [facility])    prostate  . Chronic chest wall pain   . Essential hypertension, benign 06/20/2016  . GERD (gastroesophageal reflux disease)   . H/O echocardiogram 2016   normal  . Heart murmur    "leaking heart valve"  . High cholesterol 06/20/2016  . HOH (hard of hearing)    left  . Poor historian   . PUD (peptic ulcer disease)   . Shortness of breath     Past Surgical History:  Procedure Laterality Date  . ESOPHAGOGASTRODUODENOSCOPY    . ESOPHAGOGASTRODUODENOSCOPY N/A 07/18/2016   Procedure: ESOPHAGOGASTRODUODENOSCOPY (EGD);  Surgeon: Rogene Houston, MD;  Location: [facility];  Service: Endoscopy;  Laterality: N/A;  3:00-moved to 230 per Lelon Frohlich  . HIP PINNING,CANNULATED Right 03/10/2020   Procedure: INTERNAL FIXATION RIGHT HIP;  Surgeon: Carole Civil, MD;  Location: [facility] ORS;  Service: Orthopedics;  Laterality: Right;  . ORIF WRIST FRACTURE  12/19/2011   Procedure: OPEN REDUCTION INTERNAL FIXATION (ORIF) WRIST FRACTURE;  Surgeon: Carole Civil, MD;  Location: [facility] ORS;  Service: Orthopedics;   Laterality: Right;  . prostate cancer     Diagnosed in the 90s  . TONSILLECTOMY      Social History   Socioeconomic History  . Marital status: Widowed    Spouse name: Not on file  . Number of children: Not on file  . Years of education: Not on file  . Highest education level: Not on file  Occupational History  . Occupation: retired    Fish farm manager: RETIRED  Tobacco Use  . Smoking status: Never Smoker  . Smokeless tobacco: Never Used  Substance and Sexual Activity  . Alcohol use: No  . Drug use: No  . Sexual activity: Not on file  Other Topics Concern  . Not on file  Social History Narrative  . Not on file   Social Determinants of Health   Financial Resource Strain:   . Difficulty of Paying Living Expenses: Not on file  Food Insecurity:   . Worried About Charity fundraiser in the Last Year: Not on file  . Ran Out of Food in the Last Year: Not on file  Transportation Needs:   . Lack of Transportation (Medical): Not on file  . Lack of Transportation (Non-Medical): Not on file  Physical Activity:   . Days of Exercise per Week: Not on file  . Minutes of Exercise per Session: Not on file  Stress:   . Feeling of Stress : Not on file  Social Connections:   .  Frequency of Communication with Friends and Family: Not on file  . Frequency of Social Gatherings with Friends and Family: Not on file  . Attends Religious Services: Not on file  . Active Member of Clubs or Organizations: Not on file  . Attends Archivist Meetings: Not on file  . Marital Status: Not on file  Intimate Partner Violence:   . Fear of Current or Ex-Partner: Not on file  . Emotionally Abused: Not on file  . Physically Abused: Not on file  . Sexually Abused: Not on file   Family History  Problem Relation Age of Onset  . Cancer Other   . Heart attack Mother   . Cancer Brother   . Heart attack Brother   . Heart disease Sister        by pass      VITAL SIGNS BP (!) 126/52   Pulse 80    Temp 98.2 F (36.8 C)   Resp 20   Ht 5\' 9"  (1.753 m)   Wt 199 lb (90.3 kg)   SpO2 93%   BMI 29.39 kg/m   Outpatient Encounter Medications as of 04/11/2020  Medication Sig  . acetaminophen (TYLENOL) 500 MG tablet Take 1,000 mg by mouth in the morning, at noon, and at bedtime.  Marland Kitchen alfuzosin (UROXATRAL) 10 MG 24 hr tablet Take 10 mg by mouth daily with breakfast.  . Amino Acids-Protein Hydrolys (PRO-STAT PO) liquid; 15-100 gram-kcal/30 mL; amt: 30 ml; oral Special Instructions: for albumin 2.5 With Meals  . aspirin EC 325 MG EC tablet Take 325 mg daily thru 11/5 then resume 81 mg daily  . Balsam [place]-Castor Oil [facility]) OINT Special Instructions: Apply to bilateral buttocks & sacral area qshift for blanchable erythema.  . calcium carbonate (TUMS - DOSED IN MG ELEMENTAL CALCIUM) 500 MG chewable tablet Chew 1 tablet by mouth 2 (two) times daily as needed for indigestion or heartburn.   . diltiazem (CARDIZEM CD) 360 MG 24 hr capsule Take 360 mg by mouth daily. Start 03/23/2020  . ferrous sulfate 325 (65 FE) MG tablet Take 325 mg by mouth daily with breakfast. For Anemia and  Iron Deficiency  . Flax Oil-Fish Oil-Borage Oil (FISH OIL-FLAX OIL-BORAGE OIL) CAPS Take 1 capsule by mouth daily.  . hydrALAZINE (APRESOLINE) 50 MG tablet Take 100 mg by mouth 3 (three) times daily.   Marland Kitchen lisinopril (ZESTRIL) 40 MG tablet Take 1 tablet (40 mg total) by mouth daily.  . Melatonin 10 MG TABS Take 10 mg by mouth at bedtime.  . memantine (NAMENDA XR) 28 MG CP24 24 hr capsule Take 28 mg by mouth daily.  . NON FORMULARY Diet - Mechanical Soft  . omeprazole (PRILOSEC) 20 MG capsule Take 1 capsule (20 mg total) by mouth daily before breakfast.  . OXYGEN Inhale 2 L into the lungs continuous.  . polyethylene glycol (MIRALAX / GLYCOLAX) 17 g packet Take 17 g by mouth every other day.  . potassium chloride SA (KLOR-CON) 20 MEQ tablet Take 20 mEq by mouth 2 (two) times daily.   Marland Kitchen senna-docusate (SENOKOT-S) 8.6-50 MG  tablet Take 1 tablet by mouth at bedtime as needed for mild constipation.  Marland Kitchen terazosin (HYTRIN) 5 MG capsule Take 5 mg by mouth at bedtime.   Marland Kitchen venlafaxine XR (EFFEXOR-XR) 75 MG 24 hr capsule Take 75 mg by mouth daily.  . vitamin C (ASCORBIC ACID) 500 MG tablet Take 1,000 mg by mouth daily.  . [DISCONTINUED] memantine (NAMENDA XR) 21 MG CP24 24  hr capsule Take 21 mg by mouth daily.   No facility-administered encounter medications on file as of 04/11/2020.     SIGNIFICANT DIAGNOSTIC EXAMS   PREVIOUS   03-09-20: right hip x-ray: Minimally displaced right femoral neck fracture.  03-09-20: chest x-ray: Peribronchial thickening with streaky perihilar infiltrates suggesting bronchitis or multifocal pneumonia.  03-12-20: renal ultrasound: No acute abnormality. Bilateral renal cysts  03-12-20: chest x-ray: Lower lung volumes. No acute cardiopulmonary abnormality.   NO NEW EXAMS.   LABS REVIEWED PREVIOUS    03-09-20: wbc 13.7; hgb 12.1; hct 37.7; mcv 80.4 plt 205; glucose 208; bun 21; creat 1.50; k+ 3.2; na++ 141; ca 9.2; hgb a1c 5.9; HIV: nr 03-10-20: wbc 11.2; hgb 11.3; hct 35.9; mcv 82.7 plt 188; glucose 160; bun 18; creat 1.21; k+ 3.3; na++ 139; ca 8.8 liver normal 3.0; mag 2.3 03-12-20: wbc 10.4; hgb 9.3; hct 29.6; mcv 84.3 plt 178; glucose 138; bun 34; creat 1.77; k+ 3.8; na++ 138; ca 8.3 03-14-20: glucose 134; bun 36; creat 1.23; k+ 3.2; na++ 139; ca 8.8 mag 2.1   03-16-20: hgb 10.7; hct 33.4 glucose 127; bun 18; creat 0.86 k+ 3.3; na++ 139; ca 9.2 ;liver normal albumin 2.5 iron 23; tibc 221; ferritin 137 03-23-20: k+ 3.0  03-27-20: glucose 133; bun 34; creat 0.85; k+ 3.6; na++ 142; ca 9.0; AM cortisol: 10.6   NO NEW LABS.    Review of Systems  Constitutional: Negative for malaise/fatigue.  Respiratory: Negative for cough and shortness of breath.   Cardiovascular: Negative for chest pain, palpitations and leg swelling.  Gastrointestinal: Negative for abdominal pain, constipation and  heartburn.  Musculoskeletal: Negative for back pain, joint pain and myalgias.  Skin: Negative.   Neurological: Negative for dizziness.  Psychiatric/Behavioral: The patient is not nervous/anxious.     Physical Exam Constitutional:      General: He is not in acute distress.    Appearance: He is well-developed. He is not diaphoretic.  HENT:     Ears:     Comments: HOH L>R Neck:     Thyroid: No thyromegaly.  Cardiovascular:     Rate and Rhythm: Normal rate and regular rhythm.     Heart sounds: Normal heart sounds.  Pulmonary:     Effort: Pulmonary effort is normal. No respiratory distress.     Breath sounds: Normal breath sounds.  Abdominal:     General: Bowel sounds are normal. There is no distension.     Palpations: Abdomen is soft.     Tenderness: There is no abdominal tenderness.  Musculoskeletal:     Cervical back: Neck supple.     Right lower leg: No edema.     Left lower leg: No edema.     Comments: Is able to move all extremities  Status post right hip internal fixation 03-10-20    Lymphadenopathy:     Cervical: No cervical adenopathy.  Skin:    General: Skin is warm and dry.     Comments: Right heel: unstage: 4 x 4 cm   Neurological:     Mental Status: He is alert. Mental status is at baseline.  Psychiatric:        Mood and Affect: Mood normal.        ASSESSMENT/ PLAN:  TODAY  1. Acute blood loss as cause of postoperative anemia: hgb 10.7 will continue to monitor his status.   2. Interstitial lung disease is stable is off 02  3. Dementia without behavioral disturbance unspecified dementia type: is without  change weight is 199 pounds; will continue namenda xr 28 mg daily     PREVIOUS   4.  Closed displaced fracture of right femoral neck: is stable is status post right internal fixation 03-10-20; will continue therapy as directed and will follow up with orthopedics; will continue tylenol 1000 mg three times daily asa 325 mg daily through 04-14-20  5.  Essential hypertension is without change : b/p 126/52: will continue lisinopril 40 mg daily hytrin 5 mg daily  hydralazine 100 mg three times daily and cardizem cd 360 mg daily   6. Gastroesophageal reflux disease without esophagitis: is stable will continue prilosec 20 mg daily   7. Prostate cancer/overactive bladder: is stable will continue hytrin 5 mg daily urosatral 10 mg daily is off oxybutrin due to fracture  8. Renal insufficiency is stable bun 34; creat 0.85  9. Hypokalemia: is stable k+ 3.6 will continue k+ 20 meq twice daily   10. Dyslipidemia: is stable will continue fish oil 1 gm daily is off zocor  11. Chronic constipation: is stable will continue miralax daily and senna s daily as needed  12. Major depression recurrent chronic: is stable will continue effexor xr 75 mg daily melatonin 10 mg nightly for sleep.       MD is aware of resident's narcotic use and is in agreement with current plan of care. We will attempt to wean resident as appropriate.  Ok Edwards NP [facility]  Contact [phone] Monday through Friday 8am- 5pm  After hours call [phone]

## 2020-04-19 ENCOUNTER — Ambulatory Visit (INDEPENDENT_AMBULATORY_CARE_PROVIDER_SITE_OTHER): Payer: Medicare Other | Admitting: Orthopedic Surgery

## 2020-04-19 ENCOUNTER — Encounter: Payer: Self-pay | Admitting: Orthopedic Surgery

## 2020-04-19 ENCOUNTER — Inpatient Hospital Stay: Payer: Medicare Other

## 2020-04-19 DIAGNOSIS — S72001A Fracture of unspecified part of neck of right femur, initial encounter for closed fracture: Secondary | ICD-10-CM

## 2020-04-19 NOTE — Progress Notes (Signed)
Chief Complaint  Patient presents with  . Routine Post Op    03/10/20 right hip fracture/ not walking much   . Leg Problem    has restless legs at night/ wants to know who to see for it     X-rays are normal and frx is healing   Right leg is moving normally   Fu 6 weeks

## 2020-04-20 ENCOUNTER — Encounter: Payer: Self-pay | Admitting: Adult Health

## 2020-04-20 ENCOUNTER — Non-Acute Institutional Stay (SKILLED_NURSING_FACILITY): Payer: Medicare Other | Admitting: Adult Health

## 2020-04-20 DIAGNOSIS — G2581 Restless legs syndrome: Secondary | ICD-10-CM

## 2020-04-20 NOTE — Progress Notes (Signed)
Location:    [place] Room Number: 156/D Place of Service:  SNF (31)   CODE STATUS: DNR  No Known Allergies  Chief Complaint  Patient presents with  . Acute Visit    RLS    HPI:  He states that over the past several weeks his RLS has gotten worse. The jumping of his legs is interfering his ability to sleep at night. He denies any pain present. He denies any numbness or tingling at this time.   Past Medical History:  Diagnosis Date  . Anxiety   . Cancer [facility])    prostate  . Chronic chest wall pain   . Essential hypertension, benign 06/20/2016  . GERD (gastroesophageal reflux disease)   . H/O echocardiogram 2016   normal  . Heart murmur    "leaking heart valve"  . High cholesterol 06/20/2016  . HOH (hard of hearing)    left  . Poor historian   . PUD (peptic ulcer disease)   . Shortness of breath     Past Surgical History:  Procedure Laterality Date  . ESOPHAGOGASTRODUODENOSCOPY    . ESOPHAGOGASTRODUODENOSCOPY N/A 07/18/2016   Procedure: ESOPHAGOGASTRODUODENOSCOPY (EGD);  Surgeon: Rogene Houston, MD;  Location: [facility] ENDO SU[facility];  Service: Endoscopy;  Laterality: N/A;  3:00-moved to 230 per Lelon Frohlich  . HIP PINNING,CANNULATED Right 03/10/2020   Procedure: INTERNAL FIXATION RIGHT HIP;  Surgeon: Carole Civil, MD;  Location: [facility] ORS;  Service: Orthopedics;  Laterality: Right;  . ORIF WRIST FRACTURE  12/19/2011   Procedure: OPEN REDUCTION INTERNAL FIXATION (ORIF) WRIST FRACTURE;  Surgeon: Carole Civil, MD;  Location: [facility] ORS;  Service: Orthopedics;  Laterality: Right;  . prostate cancer     Diagnosed in the 90s  . TONSILLECTOMY      Social History   Socioeconomic History  . Marital status: Widowed    Spouse name: Not on file  . Number of children: Not on file  . Years of education: Not on file  . Highest education level: Not on file  Occupational History  . Occupation: retired    Fish farm manager: RETIRED  Tobacco Use  . Smoking status: Never  Smoker  . Smokeless tobacco: Never Used  Substance and Sexual Activity  . Alcohol use: No  . Drug use: No  . Sexual activity: Not on file  Other Topics Concern  . Not on file  Social History Narrative  . Not on file   Social Determinants of Health   Financial Resource Strain:   . Difficulty of Paying Living Expenses: Not on file  Food Insecurity:   . Worried About Charity fundraiser in the Last Year: Not on file  . Ran Out of Food in the Last Year: Not on file  Transportation Needs:   . Lack of Transportation (Medical): Not on file  . Lack of Transportation (Non-Medical): Not on file  Physical Activity:   . Days of Exercise per Week: Not on file  . Minutes of Exercise per Session: Not on file  Stress:   . Feeling of Stress : Not on file  Social Connections:   . Frequency of Communication with Friends and Family: Not on file  . Frequency of Social Gatherings with Friends and Family: Not on file  . Attends Religious Services: Not on file  . Active Member of Clubs or Organizations: Not on file  . Attends Archivist Meetings: Not on file  . Marital Status: Not on file  Intimate Partner Violence:   .  Fear of Current or Ex-Partner: Not on file  . Emotionally Abused: Not on file  . Physically Abused: Not on file  . Sexually Abused: Not on file   Family History  Problem Relation Age of Onset  . Cancer Other   . Heart attack Mother   . Cancer Brother   . Heart attack Brother   . Heart disease Sister        by pass      VITAL SIGNS BP (!) 141/79   Pulse 84   Temp 98 F (36.7 C)   Resp 18   Ht 5\' 9"  (1.753 m)   Wt 196 lb 12.8 oz (89.3 kg)   SpO2 95%   BMI 29.06 kg/m   Outpatient Encounter Medications as of 04/20/2020  Medication Sig  . acetaminophen (TYLENOL) 500 MG tablet Take 1,000 mg by mouth in the morning, at noon, and at bedtime.  Marland Kitchen alfuzosin (UROXATRAL) 10 MG 24 hr tablet Take 10 mg by mouth daily with breakfast.  . Amino Acids-Protein  Hydrolys (PRO-STAT PO) liquid; 15-100 gram-kcal/30 mL; amt: 30 ml; oral Special Instructions: for albumin 2.5 With Meals  . aspirin 81 MG chewable tablet Chew 81 mg by mouth daily.  Roseanne Kaufman [place]-Castor Oil [facility]) OINT Special Instructions: Apply to bilateral buttocks & sacral area qshift for blanchable erythema.  . calcium carbonate (TUMS - DOSED IN MG ELEMENTAL CALCIUM) 500 MG chewable tablet Chew 1 tablet by mouth 2 (two) times daily as needed for indigestion or heartburn.   . diltiazem (CARDIZEM CD) 360 MG 24 hr capsule Take 360 mg by mouth daily. Start 03/23/2020  . ferrous sulfate 325 (65 FE) MG tablet Take 325 mg by mouth daily with breakfast. For Anemia and  Iron Deficiency  . Flax Oil-Fish Oil-Borage Oil (FISH OIL-FLAX OIL-BORAGE OIL) CAPS Take 1 capsule by mouth daily.  . hydrALAZINE (APRESOLINE) 50 MG tablet Take 100 mg by mouth 3 (three) times daily.   Marland Kitchen lisinopril (ZESTRIL) 40 MG tablet Take 1 tablet (40 mg total) by mouth daily.  . Melatonin 10 MG TABS Take 10 mg by mouth at bedtime.  . memantine (NAMENDA XR) 28 MG CP24 24 hr capsule Take 28 mg by mouth daily.   . NON FORMULARY Diet - Mechanical Soft  . omeprazole (PRILOSEC) 20 MG capsule Take 1 capsule (20 mg total) by mouth daily before breakfast.  . OXYGEN Inhale 2 L into the lungs continuous.  . polyethylene glycol (MIRALAX / GLYCOLAX) 17 g packet Take 17 g by mouth every other day.  . potassium chloride SA (KLOR-CON) 20 MEQ tablet Take 20 mEq by mouth 2 (two) times daily.   Marland Kitchen senna-docusate (SENOKOT-S) 8.6-50 MG tablet Take 1 tablet by mouth at bedtime as needed for mild constipation.  Marland Kitchen terazosin (HYTRIN) 5 MG capsule Take 5 mg by mouth at bedtime.   Marland Kitchen venlafaxine XR (EFFEXOR-XR) 75 MG 24 hr capsule Take 75 mg by mouth daily.  . vitamin C (ASCORBIC ACID) 500 MG tablet Take 1,000 mg by mouth daily.  . [DISCONTINUED] aspirin EC 325 MG EC tablet Take 325 mg daily thru 11/5 then resume 81 mg daily  . [DISCONTINUED]  simvastatin (ZOCOR) 10 MG tablet    No facility-administered encounter medications on file as of 04/20/2020.     SIGNIFICANT DIAGNOSTIC EXAMS   PREVIOUS   03-09-20: right hip x-ray: Minimally displaced right femoral neck fracture.  03-09-20: chest x-ray: Peribronchial thickening with streaky perihilar infiltrates suggesting bronchitis or multifocal pneumonia.  03-12-20:  renal ultrasound: No acute abnormality. Bilateral renal cysts  03-12-20: chest x-ray: Lower lung volumes. No acute cardiopulmonary abnormality.   NO NEW EXAMS.   LABS REVIEWED PREVIOUS    03-09-20: wbc 13.7; hgb 12.1; hct 37.7; mcv 80.4 plt 205; glucose 208; bun 21; creat 1.50; k+ 3.2; na++ 141; ca 9.2; hgb a1c 5.9; HIV: nr 03-10-20: wbc 11.2; hgb 11.3; hct 35.9; mcv 82.7 plt 188; glucose 160; bun 18; creat 1.21; k+ 3.3; na++ 139; ca 8.8 liver normal 3.0; mag 2.3 03-12-20: wbc 10.4; hgb 9.3; hct 29.6; mcv 84.3 plt 178; glucose 138; bun 34; creat 1.77; k+ 3.8; na++ 138; ca 8.3 03-14-20: glucose 134; bun 36; creat 1.23; k+ 3.2; na++ 139; ca 8.8 mag 2.1   03-16-20: hgb 10.7; hct 33.4 glucose 127; bun 18; creat 0.86 k+ 3.3; na++ 139; ca 9.2 ;liver normal albumin 2.5 iron 23; tibc 221; ferritin 137 03-23-20: k+ 3.0  03-27-20: glucose 133; bun 34; creat 0.85; k+ 3.6; na++ 142; ca 9.0; AM cortisol: 10.6   NO NEW LABS.    Review of Systems  Constitutional: Negative for malaise/fatigue.  Respiratory: Negative for cough and shortness of breath.   Cardiovascular: Negative for chest pain, palpitations and leg swelling.  Gastrointestinal: Negative for abdominal pain, constipation and heartburn.  Musculoskeletal: Negative for back pain, joint pain and myalgias.  Skin: Negative.   Neurological: Negative for dizziness.       Legs jump at night   Psychiatric/Behavioral: The patient is not nervous/anxious.     Physical Exam Constitutional:      General: He is not in acute distress.    Appearance: He is well-developed. He is not  diaphoretic.  HENT:     Ears:     Comments: HOH L>R Neck:     Thyroid: No thyromegaly.  Cardiovascular:     Rate and Rhythm: Normal rate and regular rhythm.     Pulses: Normal pulses.     Heart sounds: Normal heart sounds.  Pulmonary:     Effort: Pulmonary effort is normal. No respiratory distress.     Breath sounds: Normal breath sounds.  Abdominal:     General: Bowel sounds are normal. There is no distension.     Palpations: Abdomen is soft.     Tenderness: There is no abdominal tenderness.  Musculoskeletal:     Cervical back: Neck supple.     Right lower leg: No edema.     Left lower leg: No edema.     Comments: Is able to move all extremities  Status post right hip internal fixation 03-10-20     Lymphadenopathy:     Cervical: No cervical adenopathy.  Skin:    General: Skin is warm and dry.  Neurological:     Mental Status: He is alert. Mental status is at baseline.  Psychiatric:        Mood and Affect: Mood normal.       ASSESSMENT/ PLAN:  TODAY  1. Restless leg syndrome.  Is worse  Will begin requip 0.25 mg nightly  Will monitor his response.     MD is aware of resident's narcotic use and is in agreement with current plan of care. We will attempt to wean resident as appropriate.  Ok Edwards NP [facility] Adult Medicine  Contact [phone] Monday through Friday 8am- 5pm  After hours call [phone]

## 2020-04-21 DIAGNOSIS — G2581 Restless legs syndrome: Secondary | ICD-10-CM | POA: Insufficient documentation

## 2020-04-24 ENCOUNTER — Other Ambulatory Visit: Payer: Self-pay | Admitting: *Deleted

## 2020-04-24 NOTE — Patient Outreach (Signed)
[facility] Post- Acute Care Coordinator follow up. Member screened for potential [facility] Care Management needs as a benefit of [place] Medicare.  Verified in Patient [place] that member resides in [facility] SNF.   Communication sent to SNF SW to inquire about transition plans.  Will continue to follow while member resides in SNF.    Marthenia Rolling, MSN-Ed, RN,BSN [place] Acute Care Coordinator [phone] [facility])

## 2020-04-26 ENCOUNTER — Other Ambulatory Visit: Payer: Self-pay | Admitting: *Deleted

## 2020-04-26 NOTE — Patient Outreach (Signed)
[place] Coordinator follow up. Member screened for potential [facility] Care Management needs as a benefit of [place] Medicare.  Update received from [facility] indicates member has transitioned to LTC at the facility.   No identifiable [facility] Care Management needs at this time.    Marthenia Rolling, MSN, RN,BSN [place] Acute Care Coordinator [phone] [facility]) [phone]  (Toll free office)

## 2020-04-27 ENCOUNTER — Encounter: Payer: Self-pay | Admitting: Adult Health

## 2020-04-27 ENCOUNTER — Non-Acute Institutional Stay (SKILLED_NURSING_FACILITY): Payer: Medicare Other | Admitting: Adult Health

## 2020-04-27 DIAGNOSIS — S72001A Fracture of unspecified part of neck of right femur, initial encounter for closed fracture: Secondary | ICD-10-CM | POA: Diagnosis not present

## 2020-04-27 DIAGNOSIS — F0390 Unspecified dementia without behavioral disturbance: Secondary | ICD-10-CM | POA: Diagnosis not present

## 2020-04-27 DIAGNOSIS — C61 Malignant neoplasm of prostate: Secondary | ICD-10-CM | POA: Diagnosis not present

## 2020-04-27 NOTE — Progress Notes (Signed)
Location:    [place] Room Number: 156/D Place of Service:  SNF (31)   CODE STATUS: DNR  No Known Allergies  Chief Complaint  Patient presents with  . Acute Visit    Care Plan Meeting    HPI:  We have come together for his care plan meeting. BIMS 15/15 mood 7/30.  He is a long term resident of this facility. He has a right heel unstaged wound. There are no recent falls. His weight is stable 196.8 pounds. He requires limited to extensive assist with adls. Is occasionally incontinent of urine; frequently incontinent of bowel. He feeds himself. He has a good appetite. There are no reports of uncontrolled pain. He continues to be followed for his chronic illnesses including: Dementia without behavioral disturbance unspecified dementia type Closed displaced fracture of right femoral neck Prostate cancer  Past Medical History:  Diagnosis Date  . Anxiety   . Cancer [facility])    prostate  . Chronic chest wall pain   . Essential hypertension, benign 06/20/2016  . GERD (gastroesophageal reflux disease)   . H/O echocardiogram 2016   normal  . Heart murmur    "leaking heart valve"  . High cholesterol 06/20/2016  . HOH (hard of hearing)    left  . Poor historian   . PUD (peptic ulcer disease)   . Shortness of breath     Past Surgical History:  Procedure Laterality Date  . ESOPHAGOGASTRODUODENOSCOPY    . ESOPHAGOGASTRODUODENOSCOPY N/A 07/18/2016   Procedure: ESOPHAGOGASTRODUODENOSCOPY (EGD);  Surgeon: Rogene Houston, MD;  Location: [facility];  Service: Endoscopy;  Laterality: N/A;  3:00-moved to 230 per Lelon Frohlich  . HIP PINNING,CANNULATED Right 03/10/2020   Procedure: INTERNAL FIXATION RIGHT HIP;  Surgeon: Carole Civil, MD;  Location: [facility] ORS;  Service: Orthopedics;  Laterality: Right;  . ORIF WRIST FRACTURE  12/19/2011   Procedure: OPEN REDUCTION INTERNAL FIXATION (ORIF) WRIST FRACTURE;  Surgeon: Carole Civil, MD;  Location: [facility] ORS;  Service: Orthopedics;   Laterality: Right;  . prostate cancer     Diagnosed in the 90s  . TONSILLECTOMY      Social History   Socioeconomic History  . Marital status: Widowed    Spouse name: Not on file  . Number of children: Not on file  . Years of education: Not on file  . Highest education level: Not on file  Occupational History  . Occupation: retired    Fish farm manager: RETIRED  Tobacco Use  . Smoking status: Never Smoker  . Smokeless tobacco: Never Used  Substance and Sexual Activity  . Alcohol use: No  . Drug use: No  . Sexual activity: Not on file  Other Topics Concern  . Not on file  Social History Narrative  . Not on file   Social Determinants of Health   Financial Resource Strain:   . Difficulty of Paying Living Expenses: Not on file  Food Insecurity:   . Worried About Charity fundraiser in the Last Year: Not on file  . Ran Out of Food in the Last Year: Not on file  Transportation Needs:   . Lack of Transportation (Medical): Not on file  . Lack of Transportation (Non-Medical): Not on file  Physical Activity:   . Days of Exercise per Week: Not on file  . Minutes of Exercise per Session: Not on file  Stress:   . Feeling of Stress : Not on file  Social Connections:   . Frequency of Communication  with Friends and Family: Not on file  . Frequency of Social Gatherings with Friends and Family: Not on file  . Attends Religious Services: Not on file  . Active Member of Clubs or Organizations: Not on file  . Attends Archivist Meetings: Not on file  . Marital Status: Not on file  Intimate Partner Violence:   . Fear of Current or Ex-Partner: Not on file  . Emotionally Abused: Not on file  . Physically Abused: Not on file  . Sexually Abused: Not on file   Family History  Problem Relation Age of Onset  . Cancer Other   . Heart attack Mother   . Cancer Brother   . Heart attack Brother   . Heart disease Sister        by pass      VITAL SIGNS BP 140/78   Pulse 84   Temp  98.2 F (36.8 C)   Resp 18   Ht 5\' 9"  (1.753 m)   Wt 196 lb 12.8 oz (89.3 kg)   SpO2 98%   BMI 29.06 kg/m   Outpatient Encounter Medications as of 04/27/2020  Medication Sig  . acetaminophen (TYLENOL) 500 MG tablet Take 1,000 mg by mouth in the morning, at noon, and at bedtime.  Marland Kitchen alfuzosin (UROXATRAL) 10 MG 24 hr tablet Take 10 mg by mouth daily with breakfast.  . Amino Acids-Protein Hydrolys (PRO-STAT PO) liquid; 15-100 gram-kcal/30 mL; amt: 30 ml; oral Special Instructions: for albumin 2.5 With Meals  . aspirin 81 MG chewable tablet Chew 81 mg by mouth daily.  Roseanne Kaufman [place]-Castor Oil [facility]) OINT Special Instructions: Apply to bilateral buttocks & sacral area qshift for blanchable erythema.  . calcium carbonate (TUMS - DOSED IN MG ELEMENTAL CALCIUM) 500 MG chewable tablet Chew 1 tablet by mouth 2 (two) times daily as needed for indigestion or heartburn.   . diltiazem (CARDIZEM CD) 360 MG 24 hr capsule Take 360 mg by mouth daily. Start 03/23/2020  . ferrous sulfate 325 (65 FE) MG tablet Take 325 mg by mouth daily with breakfast. For Anemia and  Iron Deficiency  . Flax Oil-Fish Oil-Borage Oil (FISH OIL-FLAX OIL-BORAGE OIL) CAPS Take 1 capsule by mouth daily.  . hydrALAZINE (APRESOLINE) 50 MG tablet Take 100 mg by mouth 3 (three) times daily.   Marland Kitchen lisinopril (ZESTRIL) 40 MG tablet Take 1 tablet (40 mg total) by mouth daily.  . Melatonin 10 MG TABS Take 10 mg by mouth at bedtime.  . memantine (NAMENDA XR) 28 MG CP24 24 hr capsule Take 28 mg by mouth daily.   . NON FORMULARY Diet - Mechanical Soft  . omeprazole (PRILOSEC) 20 MG capsule Take 1 capsule (20 mg total) by mouth daily before breakfast.  . OXYGEN Inhale 2 L into the lungs continuous.  . polyethylene glycol (MIRALAX / GLYCOLAX) 17 g packet Take 17 g by mouth every other day.  . potassium chloride SA (KLOR-CON) 20 MEQ tablet Take 20 mEq by mouth 2 (two) times daily.   Marland Kitchen rOPINIRole (REQUIP) 0.25 MG tablet Take 0.25 mg by mouth  at bedtime.  . senna-docusate (SENOKOT-S) 8.6-50 MG tablet Take 1 tablet by mouth at bedtime as needed for mild constipation.  Marland Kitchen terazosin (HYTRIN) 5 MG capsule Take 5 mg by mouth at bedtime.   Marland Kitchen venlafaxine XR (EFFEXOR-XR) 75 MG 24 hr capsule Take 75 mg by mouth daily.  . vitamin C (ASCORBIC ACID) 500 MG tablet Take 1,000 mg by mouth daily.   No  facility-administered encounter medications on file as of 04/27/2020.     SIGNIFICANT DIAGNOSTIC EXAMS   PREVIOUS   03-09-20: right hip x-ray: Minimally displaced right femoral neck fracture.  03-09-20: chest x-ray: Peribronchial thickening with streaky perihilar infiltrates suggesting bronchitis or multifocal pneumonia.  03-12-20: renal ultrasound: No acute abnormality. Bilateral renal cysts  03-12-20: chest x-ray: Lower lung volumes. No acute cardiopulmonary abnormality.   NO NEW EXAMS.   LABS REVIEWED PREVIOUS    03-09-20: wbc 13.7; hgb 12.1; hct 37.7; mcv 80.4 plt 205; glucose 208; bun 21; creat 1.50; k+ 3.2; na++ 141; ca 9.2; hgb a1c 5.9; HIV: nr 03-10-20: wbc 11.2; hgb 11.3; hct 35.9; mcv 82.7 plt 188; glucose 160; bun 18; creat 1.21; k+ 3.3; na++ 139; ca 8.8 liver normal 3.0; mag 2.3 03-12-20: wbc 10.4; hgb 9.3; hct 29.6; mcv 84.3 plt 178; glucose 138; bun 34; creat 1.77; k+ 3.8; na++ 138; ca 8.3 03-14-20: glucose 134; bun 36; creat 1.23; k+ 3.2; na++ 139; ca 8.8 mag 2.1   03-16-20: hgb 10.7; hct 33.4 glucose 127; bun 18; creat 0.86 k+ 3.3; na++ 139; ca 9.2 ;liver normal albumin 2.5 iron 23; tibc 221; ferritin 137 03-23-20: k+ 3.0  03-27-20: glucose 133; bun 34; creat 0.85; k+ 3.6; na++ 142; ca 9.0; AM cortisol: 10.6   NO NEW LABS.    Review of Systems  Constitutional: Negative for malaise/fatigue.  Respiratory: Negative for cough and shortness of breath.   Cardiovascular: Negative for chest pain, palpitations and leg swelling.  Gastrointestinal: Negative for abdominal pain, constipation and heartburn.  Musculoskeletal: Negative for  back pain, joint pain and myalgias.  Skin: Negative.   Neurological: Negative for dizziness.  Psychiatric/Behavioral: The patient is not nervous/anxious.     Physical Exam Constitutional:      General: He is not in acute distress.    Appearance: He is well-developed. He is not diaphoretic.  HENT:     Ears:     Comments: HOH L>R Neck:     Thyroid: No thyromegaly.  Cardiovascular:     Rate and Rhythm: Normal rate and regular rhythm.     Pulses: Normal pulses.     Heart sounds: Normal heart sounds.  Pulmonary:     Effort: Pulmonary effort is normal. No respiratory distress.     Breath sounds: Normal breath sounds.  Abdominal:     General: Bowel sounds are normal. There is no distension.     Palpations: Abdomen is soft.     Tenderness: There is no abdominal tenderness.  Musculoskeletal:     Cervical back: Neck supple.     Right lower leg: No edema.     Left lower leg: No edema.     Comments: Is able to move all extremities  Status post right hip internal fixation 03-10-20      Lymphadenopathy:     Cervical: No cervical adenopathy.  Skin:    General: Skin is warm and dry.  Neurological:     Mental Status: He is alert and oriented to person, place, and time.  Psychiatric:        Mood and Affect: Mood normal.       ASSESSMENT/ PLAN:  TODAY  1. Dementia without behavioral disturbance unspecified dementia type 2. Closed displaced fracture of right femoral neck 3. Prostate cancer  Will continue current medications Will continue current plan of care Will continue to monitor his status.   MD is aware of resident's narcotic use and is in agreement with current plan of  care. We will attempt to wean resident as appropriate.  Ok Edwards NP [facility] Adult Medicine  Contact [phone] Monday through Friday 8am- 5pm  After hours call [phone]

## 2020-05-12 ENCOUNTER — Non-Acute Institutional Stay (SKILLED_NURSING_FACILITY): Payer: Medicare Other | Admitting: Adult Health

## 2020-05-12 ENCOUNTER — Encounter: Payer: Self-pay | Admitting: Adult Health

## 2020-05-12 DIAGNOSIS — K219 Gastro-esophageal reflux disease without esophagitis: Secondary | ICD-10-CM | POA: Diagnosis not present

## 2020-05-12 DIAGNOSIS — S72001A Fracture of unspecified part of neck of right femur, initial encounter for closed fracture: Secondary | ICD-10-CM

## 2020-05-12 DIAGNOSIS — I10 Essential (primary) hypertension: Secondary | ICD-10-CM | POA: Diagnosis not present

## 2020-05-12 NOTE — Progress Notes (Signed)
Location:    [place] Room Number: 156-D Place of Service:  SNF (31)   CODE STATUS: DNR  No Known Allergies  Chief Complaint  Patient presents with  . Medical Management of Chronic Issues           Closed displaced fracture of right femoral neck:    Essential hypertension:  Gastroesophageal reflux disease without esophagitis    HPI:  He is a 86 year old long term resident of this facility being seen for the management of his chronic illnesses: Closed displaced fracture of right femoral neck:    Essential hypertension: I   Gastroesophageal reflux disease without esophagitis. He denies any pain; no heart burn; no constipation.   Past Medical History:  Diagnosis Date  . Anxiety   . Cancer [facility])    prostate  . Chronic chest wall pain   . Essential hypertension, benign 06/20/2016  . GERD (gastroesophageal reflux disease)   . H/O echocardiogram 2016   normal  . Heart murmur    "leaking heart valve"  . High cholesterol 06/20/2016  . HOH (hard of hearing)    left  . Poor historian   . PUD (peptic ulcer disease)   . Shortness of breath     Past Surgical History:  Procedure Laterality Date  . ESOPHAGOGASTRODUODENOSCOPY    . ESOPHAGOGASTRODUODENOSCOPY N/A 07/18/2016   Procedure: ESOPHAGOGASTRODUODENOSCOPY (EGD);  Surgeon: Rogene Houston, MD;  Location: [facility] ENDO [facility];  Service: Endoscopy;  Laterality: N/A;  3:00-moved to 230 per Lelon Frohlich  . HIP PINNING,CANNULATED Right 03/10/2020   Procedure: INTERNAL FIXATION RIGHT HIP;  Surgeon: Carole Civil, MD;  Location: [facility] ORS;  Service: Orthopedics;  Laterality: Right;  . ORIF WRIST FRACTURE  12/19/2011   Procedure: OPEN REDUCTION INTERNAL FIXATION (ORIF) WRIST FRACTURE;  Surgeon: Carole Civil, MD;  Location: [facility] ORS;  Service: Orthopedics;  Laterality: Right;  . prostate cancer     Diagnosed in the 90s  . TONSILLECTOMY      Social History   Socioeconomic History  . Marital status: Widowed    Spouse  name: Not on file  . Number of children: Not on file  . Years of education: Not on file  . Highest education level: Not on file  Occupational History  . Occupation: retired    Fish farm manager: RETIRED  Tobacco Use  . Smoking status: Never Smoker  . Smokeless tobacco: Never Used  Vaping Use  . Vaping Use: Never used  Substance and Sexual Activity  . Alcohol use: No  . Drug use: No  . Sexual activity: Not on file  Other Topics Concern  . Not on file  Social History Narrative  . Not on file   Social Determinants of Health   Financial Resource Strain:   . Difficulty of Paying Living Expenses: Not on file  Food Insecurity:   . Worried About Charity fundraiser in the Last Year: Not on file  . Ran Out of Food in the Last Year: Not on file  Transportation Needs:   . Lack of Transportation (Medical): Not on file  . Lack of Transportation (Non-Medical): Not on file  Physical Activity:   . Days of Exercise per Week: Not on file  . Minutes of Exercise per Session: Not on file  Stress:   . Feeling of Stress : Not on file  Social Connections:   . Frequency of Communication with Friends and Family: Not on file  . Frequency of Social Gatherings with  Friends and Family: Not on file  . Attends Religious Services: Not on file  . Active Member of Clubs or Organizations: Not on file  . Attends Archivist Meetings: Not on file  . Marital Status: Not on file  Intimate Partner Violence:   . Fear of Current or Ex-Partner: Not on file  . Emotionally Abused: Not on file  . Physically Abused: Not on file  . Sexually Abused: Not on file   Family History  Problem Relation Age of Onset  . Cancer Other   . Heart attack Mother   . Cancer Brother   . Heart attack Brother   . Heart disease Sister        by pass      VITAL SIGNS BP (!) 142/74   Pulse 84   Temp (!) 97.3 F (36.3 C)   Resp 20   Ht 5\' 9"  (1.753 m)   Wt 196 lb 12.8 oz (89.3 kg)   SpO2 95%   BMI 29.06 kg/m    Outpatient Encounter Medications as of 05/12/2020  Medication Sig  . acetaminophen (TYLENOL) 500 MG tablet Take 1,000 mg by mouth in the morning, at noon, and at bedtime.  Marland Kitchen alfuzosin (UROXATRAL) 10 MG 24 hr tablet Take 10 mg by mouth daily with breakfast.  . Amino Acids-Protein Hydrolys (PRO-STAT PO) liquid; 15-100 gram-kcal/30 mL; amt: 30 ml; oral Special Instructions: for albumin 2.5 With Meals  . Ascorbic Acid (VITAMIN C) 1000 MG tablet Take 1,000 mg by mouth daily.  Marland Kitchen aspirin 81 MG chewable tablet Chew 81 mg by mouth daily.  Roseanne Kaufman [place]-Castor Oil [facility]) OINT Special Instructions: Apply to bilateral buttocks & sacral area qshift for blanchable erythema.  . calcium carbonate (TUMS - DOSED IN MG ELEMENTAL CALCIUM) 500 MG chewable tablet Chew 1 tablet by mouth 2 (two) times daily as needed for indigestion or heartburn.   . diltiazem (CARDIZEM CD) 360 MG 24 hr capsule Take 360 mg by mouth daily. Start 03/23/2020  . ferrous sulfate 325 (65 FE) MG tablet Take 325 mg by mouth daily with breakfast. For Anemia and  Iron Deficiency  . Flax Oil-Fish Oil-Borage Oil (FISH OIL-FLAX OIL-BORAGE OIL) CAPS Take 1 capsule by mouth daily.  . hydrALAZINE (APRESOLINE) 100 MG tablet Take 100 mg by mouth 3 (three) times daily.  Marland Kitchen lisinopril (ZESTRIL) 40 MG tablet Take 1 tablet (40 mg total) by mouth daily.  . Melatonin 10 MG TABS Take 10 mg by mouth at bedtime.  . memantine (NAMENDA) 10 MG tablet Take 10 mg by mouth 2 (two) times daily.  . NON FORMULARY Diet - Mechanical Soft  . omeprazole (PRILOSEC) 20 MG capsule Take 1 capsule (20 mg total) by mouth daily before breakfast.  . OXYGEN Inhale 2 L into the lungs continuous.  . polyethylene glycol (MIRALAX / GLYCOLAX) 17 g packet Take 17 g by mouth daily.  . potassium chloride SA (KLOR-CON) 20 MEQ tablet Take 20 mEq by mouth 2 (two) times daily.   Marland Kitchen rOPINIRole (REQUIP) 0.25 MG tablet Take 0.25 mg by mouth at bedtime.  . salicylic acid 17 % gel Apply 1  application topically in the morning and at bedtime. Apply to warts to left hand  . senna-docusate (SENOKOT-S) 8.6-50 MG tablet Take 1 tablet by mouth at bedtime as needed for mild constipation.  Marland Kitchen terazosin (HYTRIN) 5 MG capsule Take 5 mg by mouth at bedtime.   Marland Kitchen venlafaxine XR (EFFEXOR-XR) 75 MG 24 hr capsule Take 75 mg  by mouth daily.  . [DISCONTINUED] hydrALAZINE (APRESOLINE) 50 MG tablet Take 100 mg by mouth 3 (three) times daily.   . [DISCONTINUED] memantine (NAMENDA XR) 28 MG CP24 24 hr capsule Take 28 mg by mouth daily.   . [DISCONTINUED] polyethylene glycol (MIRALAX / GLYCOLAX) 17 g packet Take 17 g by mouth every other day. (Patient not taking: Reported on 05/12/2020)  . [DISCONTINUED] vitamin C (ASCORBIC ACID) 500 MG tablet Take 1,000 mg by mouth daily.   No facility-administered encounter medications on file as of 05/12/2020.     SIGNIFICANT DIAGNOSTIC EXAMS   PREVIOUS   03-09-20: right hip x-ray: Minimally displaced right femoral neck fracture.  03-09-20: chest x-ray: Peribronchial thickening with streaky perihilar infiltrates suggesting bronchitis or multifocal pneumonia.  03-12-20: renal ultrasound: No acute abnormality. Bilateral renal cysts  03-12-20: chest x-ray: Lower lung volumes. No acute cardiopulmonary abnormality.   NO NEW EXAMS.   LABS REVIEWED PREVIOUS    03-09-20: wbc 13.7; hgb 12.1; hct 37.7; mcv 80.4 plt 205; glucose 208; bun 21; creat 1.50; k+ 3.2; na++ 141; ca 9.2; hgb a1c 5.9; HIV: nr 03-10-20: wbc 11.2; hgb 11.3; hct 35.9; mcv 82.7 plt 188; glucose 160; bun 18; creat 1.21; k+ 3.3; na++ 139; ca 8.8 liver normal 3.0; mag 2.3 03-12-20: wbc 10.4; hgb 9.3; hct 29.6; mcv 84.3 plt 178; glucose 138; bun 34; creat 1.77; k+ 3.8; na++ 138; ca 8.3 03-14-20: glucose 134; bun 36; creat 1.23; k+ 3.2; na++ 139; ca 8.8 mag 2.1   03-16-20: hgb 10.7; hct 33.4 glucose 127; bun 18; creat 0.86 k+ 3.3; na++ 139; ca 9.2 ;liver normal albumin 2.5 iron 23; tibc 221; ferritin 137  03-23-20: k+ 3.0  03-27-20: glucose 133; bun 34; creat 0.85; k+ 3.6; na++ 142; ca 9.0; AM cortisol: 10.6   NO NEW LABS.    Review of Systems  Constitutional: Negative for malaise/fatigue.  Respiratory: Negative for cough and shortness of breath.   Cardiovascular: Negative for chest pain, palpitations and leg swelling.  Gastrointestinal: Negative for abdominal pain, constipation and heartburn.  Musculoskeletal: Negative for back pain, joint pain and myalgias.  Skin: Negative.   Neurological: Negative for dizziness.  Psychiatric/Behavioral: The patient is not nervous/anxious.      Physical Exam Constitutional:      General: He is not in acute distress.    Appearance: He is well-developed. He is not diaphoretic.  HENT:     Ears:     Comments: HOH L>R Neck:     Thyroid: No thyromegaly.  Cardiovascular:     Rate and Rhythm: Normal rate and regular rhythm.     Pulses: Normal pulses.     Heart sounds: Normal heart sounds.  Pulmonary:     Effort: Pulmonary effort is normal. No respiratory distress.     Breath sounds: Normal breath sounds.  Abdominal:     General: Bowel sounds are normal. There is no distension.     Palpations: Abdomen is soft.     Tenderness: There is no abdominal tenderness.  Musculoskeletal:     Cervical back: Neck supple.     Right lower leg: No edema.     Left lower leg: No edema.     Comments: Is able to move all extremities  Status post right hip internal fixation 03-10-20       Lymphadenopathy:     Cervical: No cervical adenopathy.  Skin:    General: Skin is warm and dry.  Neurological:     Mental Status: He is  alert and oriented to person, place, and time.  Psychiatric:        Mood and Affect: Mood normal.     ASSESSMENT/ PLAN:  TODAY  1. Closed displaced fracture of right femoral neck: is stable is status post right internal fixation: 03-10-20. Will continue tylenol 1 gm three times daily  2. Essential hypertension: is stable b/p 142/74 will  continue lisinopril 40 mg daily; hytrin 5 mg daily hydralazine 100 mg three times daily and cardizem cd 360 mg daily   3. Gastroesophageal reflux disease without esophagitis: is stable will continue prilosec 20 mg daily     PREVIOUS    4. Prostate cancer/overactive bladder: is stable will continue hytrin 5 mg daily urosatral 10 mg daily is off oxybutrin due to fracture  5. Renal insufficiency is stable bun 34; creat 0.85  6. Hypokalemia: is stable k+ 3.6 will continue k+ 20 meq twice daily   7. Dyslipidemia: is stable will continue fish oil 1 gm daily is off zocor  8. Chronic constipation: is stable will continue miralax daily and senna s daily as needed  9. Major depression recurrent chronic: is stable will continue effexor xr 75 mg daily melatonin 10 mg nightly for sleep.   10. RLS: is stable will continue requip 0.25 mg nightly   11. Acute blood loss as cause of postoperative anemia: hgb 10.7 will continue to monitor his status.   12. Interstitial lung disease is stable is off 02  13. Dementia without behavioral disturbance unspecified dementia type: is without change weight is 194 pounds; will continue namenda 10 mg twice daily       MD is aware of resident's narcotic use and is in agreement with current plan of care. We will attempt to wean resident as appropriate.  Ok Edwards NP [facility]  Contact [phone] Monday through Friday 8am- 5pm  After hours call [phone]

## 2020-05-16 ENCOUNTER — Non-Acute Institutional Stay (SKILLED_NURSING_FACILITY): Payer: Medicare Other | Admitting: Adult Health

## 2020-05-16 ENCOUNTER — Encounter: Payer: Self-pay | Admitting: Adult Health

## 2020-05-16 DIAGNOSIS — F339 Major depressive disorder, recurrent, unspecified: Secondary | ICD-10-CM | POA: Diagnosis not present

## 2020-05-16 NOTE — Progress Notes (Signed)
Location:    [place] Room Number: 156-D Place of Service:  SNF (31)   CODE STATUS: DNR  No Known Allergies  Chief Complaint  Patient presents with  . Acute Visit    Anxiety     HPI:  He has a history of major depression for which he is taking effexor xr 75 mg daily. He states that he is feeling more nervous and anxious. He states that the anxiety is worse at night. He denies insomnia.   Past Medical History:  Diagnosis Date  . Anxiety   . Cancer [facility])    prostate  . Chronic chest wall pain   . Essential hypertension, benign 06/20/2016  . GERD (gastroesophageal reflux disease)   . H/O echocardiogram 2016   normal  . Heart murmur    "leaking heart valve"  . High cholesterol 06/20/2016  . HOH (hard of hearing)    left  . Poor historian   . PUD (peptic ulcer disease)   . Shortness of breath     Past Surgical History:  Procedure Laterality Date  . ESOPHAGOGASTRODUODENOSCOPY    . ESOPHAGOGASTRODUODENOSCOPY N/A 07/18/2016   Procedure: ESOPHAGOGASTRODUODENOSCOPY (EGD);  Surgeon: Rogene Houston, MD;  Location: [facility];  Service: Endoscopy;  Laterality: N/A;  3:00-moved to 230 per Lelon Frohlich  . HIP PINNING,CANNULATED Right 03/10/2020   Procedure: INTERNAL FIXATION RIGHT HIP;  Surgeon: Carole Civil, MD;  Location: [facility] ORS;  Service: Orthopedics;  Laterality: Right;  . ORIF WRIST FRACTURE  12/19/2011   Procedure: OPEN REDUCTION INTERNAL FIXATION (ORIF) WRIST FRACTURE;  Surgeon: Carole Civil, MD;  Location: [facility] ORS;  Service: Orthopedics;  Laterality: Right;  . prostate cancer     Diagnosed in the 90s  . TONSILLECTOMY      Social History   Socioeconomic History  . Marital status: Widowed    Spouse name: Not on file  . Number of children: Not on file  . Years of education: Not on file  . Highest education level: Not on file  Occupational History  . Occupation: retired    Fish farm manager: RETIRED  Tobacco Use  . Smoking status: Never Smoker   . Smokeless tobacco: Never Used  Vaping Use  . Vaping Use: Never used  Substance and Sexual Activity  . Alcohol use: No  . Drug use: No  . Sexual activity: Not on file  Other Topics Concern  . Not on file  Social History Narrative  . Not on file   Social Determinants of Health   Financial Resource Strain:   . Difficulty of Paying Living Expenses: Not on file  Food Insecurity:   . Worried About Charity fundraiser in the Last Year: Not on file  . Ran Out of Food in the Last Year: Not on file  Transportation Needs:   . Lack of Transportation (Medical): Not on file  . Lack of Transportation (Non-Medical): Not on file  Physical Activity:   . Days of Exercise per Week: Not on file  . Minutes of Exercise per Session: Not on file  Stress:   . Feeling of Stress : Not on file  Social Connections:   . Frequency of Communication with Friends and Family: Not on file  . Frequency of Social Gatherings with Friends and Family: Not on file  . Attends Religious Services: Not on file  . Active Member of Clubs or Organizations: Not on file  . Attends Archivist Meetings: Not on file  .  Marital Status: Not on file  Intimate Partner Violence:   . Fear of Current or Ex-Partner: Not on file  . Emotionally Abused: Not on file  . Physically Abused: Not on file  . Sexually Abused: Not on file   Family History  Problem Relation Age of Onset  . Cancer Other   . Heart attack Mother   . Cancer Brother   . Heart attack Brother   . Heart disease Sister        by pass      VITAL SIGNS BP 131/81   Pulse 84   Temp (!) 97.3 F (36.3 C)   Resp 20   Ht 5\' 9"  (1.753 m)   Wt 194 lb 6.4 oz (88.2 kg)   SpO2 98%   BMI 28.71 kg/m   Outpatient Encounter Medications as of 05/16/2020  Medication Sig  . acetaminophen (TYLENOL) 500 MG tablet Take 1,000 mg by mouth in the morning, at noon, and at bedtime.  Marland Kitchen alfuzosin (UROXATRAL) 10 MG 24 hr tablet Take 10 mg by mouth daily with  breakfast.  . Amino Acids-Protein Hydrolys (PRO-STAT PO) liquid; 15-100 gram-kcal/30 mL; amt: 30 ml; oral Special Instructions: for albumin 2.5 With Meals  . Ascorbic Acid (VITAMIN C) 1000 MG tablet Take 1,000 mg by mouth daily.  Marland Kitchen aspirin 81 MG chewable tablet Chew 81 mg by mouth daily.  Roseanne Kaufman [place]-Castor Oil [facility]) OINT Special Instructions: Apply to bilateral buttocks & sacral area qshift for blanchable erythema.  . calcium carbonate (TUMS - DOSED IN MG ELEMENTAL CALCIUM) 500 MG chewable tablet Chew 1 tablet by mouth 2 (two) times daily as needed for indigestion or heartburn.   . diltiazem (CARDIZEM CD) 360 MG 24 hr capsule Take 360 mg by mouth daily. Start 03/23/2020  . ferrous sulfate 325 (65 FE) MG tablet Take 325 mg by mouth daily with breakfast. For Anemia and  Iron Deficiency  . Flax Oil-Fish Oil-Borage Oil (FISH OIL-FLAX OIL-BORAGE OIL) CAPS Take 1 capsule by mouth daily.  . hydrALAZINE (APRESOLINE) 100 MG tablet Take 100 mg by mouth 3 (three) times daily.  Marland Kitchen lisinopril (ZESTRIL) 40 MG tablet Take 1 tablet (40 mg total) by mouth daily.  . Melatonin 10 MG TABS Take 10 mg by mouth at bedtime.  . memantine (NAMENDA) 10 MG tablet Take 10 mg by mouth 2 (two) times daily.  . NON FORMULARY Diet - Mechanical Soft  . omeprazole (PRILOSEC) 20 MG capsule Take 1 capsule (20 mg total) by mouth daily before breakfast.  . OXYGEN Inhale 2 L into the lungs continuous.  . polyethylene glycol (MIRALAX / GLYCOLAX) 17 g packet Take 17 g by mouth daily.  . potassium chloride SA (KLOR-CON) 20 MEQ tablet Take 20 mEq by mouth 2 (two) times daily.   Marland Kitchen rOPINIRole (REQUIP) 0.25 MG tablet Take 0.25 mg by mouth at bedtime.  . salicylic acid 17 % gel Apply 1 application topically in the morning and at bedtime. Apply to warts to left hand  . senna-docusate (SENOKOT-S) 8.6-50 MG tablet Take 1 tablet by mouth at bedtime as needed for mild constipation.  Marland Kitchen terazosin (HYTRIN) 5 MG capsule Take 5 mg by mouth at  bedtime.   Marland Kitchen venlafaxine XR (EFFEXOR-XR) 75 MG 24 hr capsule Take 75 mg by mouth daily.   No facility-administered encounter medications on file as of 05/16/2020.     SIGNIFICANT DIAGNOSTIC EXAMS   PREVIOUS   03-09-20: right hip x-ray: Minimally displaced right femoral neck fracture.  03-09-20:  chest x-ray: Peribronchial thickening with streaky perihilar infiltrates suggesting bronchitis or multifocal pneumonia.  03-12-20: renal ultrasound: No acute abnormality. Bilateral renal cysts  03-12-20: chest x-ray: Lower lung volumes. No acute cardiopulmonary abnormality.   NO NEW EXAMS.   LABS REVIEWED PREVIOUS    03-09-20: wbc 13.7; hgb 12.1; hct 37.7; mcv 80.4 plt 205; glucose 208; bun 21; creat 1.50; k+ 3.2; na++ 141; ca 9.2; hgb a1c 5.9; HIV: nr 03-10-20: wbc 11.2; hgb 11.3; hct 35.9; mcv 82.7 plt 188; glucose 160; bun 18; creat 1.21; k+ 3.3; na++ 139; ca 8.8 liver normal 3.0; mag 2.3 03-12-20: wbc 10.4; hgb 9.3; hct 29.6; mcv 84.3 plt 178; glucose 138; bun 34; creat 1.77; k+ 3.8; na++ 138; ca 8.3 03-14-20: glucose 134; bun 36; creat 1.23; k+ 3.2; na++ 139; ca 8.8 mag 2.1   03-16-20: hgb 10.7; hct 33.4 glucose 127; bun 18; creat 0.86 k+ 3.3; na++ 139; ca 9.2 ;liver normal albumin 2.5 iron 23; tibc 221; ferritin 137 03-23-20: k+ 3.0  03-27-20: glucose 133; bun 34; creat 0.85; k+ 3.6; na++ 142; ca 9.0; AM cortisol: 10.6   NO NEW LABS.     Review of Systems  Constitutional: Negative for malaise/fatigue.  Respiratory: Negative for cough and shortness of breath.   Cardiovascular: Negative for chest pain, palpitations and leg swelling.  Gastrointestinal: Negative for abdominal pain, constipation and heartburn.  Musculoskeletal: Negative for back pain, joint pain and myalgias.  Skin: Negative.   Neurological: Negative for dizziness.  Psychiatric/Behavioral: Positive for depression. The patient is nervous/anxious.     Physical Exam Constitutional:      General: He is not in acute  distress.    Appearance: He is well-developed. He is not diaphoretic.  HENT:     Ears:     Comments: HOH L>R Neck:     Thyroid: No thyromegaly.  Cardiovascular:     Rate and Rhythm: Normal rate and regular rhythm.     Pulses: Normal pulses.     Heart sounds: Normal heart sounds.  Pulmonary:     Effort: Pulmonary effort is normal. No respiratory distress.     Breath sounds: Normal breath sounds.  Abdominal:     General: Bowel sounds are normal. There is no distension.     Palpations: Abdomen is soft.     Tenderness: There is no abdominal tenderness.  Musculoskeletal:     Cervical back: Neck supple.     Right lower leg: No edema.     Left lower leg: No edema.     Comments:  Is able to move all extremities  Status post right hip internal fixation 03-10-20        Lymphadenopathy:     Cervical: No cervical adenopathy.  Skin:    General: Skin is warm and dry.  Neurological:     Mental Status: He is alert and oriented to person, place, and time.  Psychiatric:        Mood and Affect: Mood normal.      ASSESSMENT/ PLAN:  TODAY  1. Major depression recurrent chronic   Is worse Will increase to effexor xr 150 mg daily  Will monitor his status.     MD is aware of resident's narcotic use and is in agreement with current plan of care. We will attempt to wean resident as appropriate.  Ok Edwards NP [facility]  Contact [phone] Monday through Friday 8am- 5pm  After hours call [phone]

## 2020-05-22 ENCOUNTER — Encounter (HOSPITAL_COMMUNITY)
Admission: RE | Admit: 2020-05-22 | Discharge: 2020-05-22 | Disposition: A | Discharge status: Home / Self Care | Payer: Medicare Other | Source: Skilled Nursing Facility | Attending: Adult Health | Admitting: Adult Health

## 2020-05-22 ENCOUNTER — Inpatient Hospital Stay (HOSPITAL_COMMUNITY)
Admission: EM | Admit: 2020-05-22 | Discharge: 2020-06-01 | DRG: 872 | Disposition: A | Discharge status: Skilled Nursing Facility | Payer: Medicare Other | Source: Skilled Nursing Facility | Attending: Family Medicine | Admitting: Family Medicine

## 2020-05-22 ENCOUNTER — Emergency Department (HOSPITAL_COMMUNITY): Payer: Medicare Other

## 2020-05-22 ENCOUNTER — Non-Acute Institutional Stay (SKILLED_NURSING_FACILITY): Payer: Medicare Other | Admitting: Adult Health

## 2020-05-22 ENCOUNTER — Inpatient Hospital Stay (HOSPITAL_COMMUNITY): Payer: Medicare Other

## 2020-05-22 ENCOUNTER — Other Ambulatory Visit: Payer: Self-pay

## 2020-05-22 ENCOUNTER — Encounter: Payer: Self-pay | Admitting: Adult Health

## 2020-05-22 ENCOUNTER — Encounter (HOSPITAL_COMMUNITY): Payer: Self-pay | Admitting: *Deleted

## 2020-05-22 DIAGNOSIS — Z8249 Family history of ischemic heart disease and other diseases of the circulatory system: Secondary | ICD-10-CM

## 2020-05-22 DIAGNOSIS — R143 Flatulence: Secondary | ICD-10-CM | POA: Diagnosis not present

## 2020-05-22 DIAGNOSIS — I1A0 Resistant hypertension: Secondary | ICD-10-CM | POA: Diagnosis present

## 2020-05-22 DIAGNOSIS — F411 Generalized anxiety disorder: Secondary | ICD-10-CM | POA: Diagnosis present

## 2020-05-22 DIAGNOSIS — K5730 Diverticulosis of large intestine without perforation or abscess without bleeding: Secondary | ICD-10-CM | POA: Diagnosis not present

## 2020-05-22 DIAGNOSIS — R710 Precipitous drop in hematocrit: Secondary | ICD-10-CM | POA: Diagnosis present

## 2020-05-22 DIAGNOSIS — Z7982 Long term (current) use of aspirin: Secondary | ICD-10-CM

## 2020-05-22 DIAGNOSIS — Z66 Do not resuscitate: Secondary | ICD-10-CM | POA: Diagnosis present

## 2020-05-22 DIAGNOSIS — R42 Dizziness and giddiness: Secondary | ICD-10-CM | POA: Diagnosis not present

## 2020-05-22 DIAGNOSIS — K551 Chronic vascular disorders of intestine: Secondary | ICD-10-CM | POA: Diagnosis not present

## 2020-05-22 DIAGNOSIS — I10 Essential (primary) hypertension: Secondary | ICD-10-CM | POA: Diagnosis not present

## 2020-05-22 DIAGNOSIS — F0390 Unspecified dementia without behavioral disturbance: Secondary | ICD-10-CM | POA: Diagnosis present

## 2020-05-22 DIAGNOSIS — M6281 Muscle weakness (generalized): Secondary | ICD-10-CM | POA: Diagnosis not present

## 2020-05-22 DIAGNOSIS — R0602 Shortness of breath: Secondary | ICD-10-CM | POA: Diagnosis not present

## 2020-05-22 DIAGNOSIS — J9811 Atelectasis: Secondary | ICD-10-CM | POA: Diagnosis not present

## 2020-05-22 DIAGNOSIS — R011 Cardiac murmur, unspecified: Secondary | ICD-10-CM | POA: Diagnosis not present

## 2020-05-22 DIAGNOSIS — Z20822 Contact with and (suspected) exposure to covid-19: Secondary | ICD-10-CM | POA: Diagnosis present

## 2020-05-22 DIAGNOSIS — K219 Gastro-esophageal reflux disease without esophagitis: Secondary | ICD-10-CM | POA: Diagnosis present

## 2020-05-22 DIAGNOSIS — Z8711 Personal history of peptic ulcer disease: Secondary | ICD-10-CM

## 2020-05-22 DIAGNOSIS — N202 Calculus of kidney with calculus of ureter: Secondary | ICD-10-CM | POA: Diagnosis not present

## 2020-05-22 DIAGNOSIS — R109 Unspecified abdominal pain: Secondary | ICD-10-CM | POA: Diagnosis not present

## 2020-05-22 DIAGNOSIS — F339 Major depressive disorder, recurrent, unspecified: Secondary | ICD-10-CM | POA: Diagnosis not present

## 2020-05-22 DIAGNOSIS — Z9181 History of falling: Secondary | ICD-10-CM | POA: Diagnosis not present

## 2020-05-22 DIAGNOSIS — C61 Malignant neoplasm of prostate: Secondary | ICD-10-CM | POA: Diagnosis not present

## 2020-05-22 DIAGNOSIS — K2900 Acute gastritis without bleeding: Secondary | ICD-10-CM

## 2020-05-22 DIAGNOSIS — Z79899 Other long term (current) drug therapy: Secondary | ICD-10-CM | POA: Diagnosis not present

## 2020-05-22 DIAGNOSIS — E785 Hyperlipidemia, unspecified: Secondary | ICD-10-CM | POA: Diagnosis present

## 2020-05-22 DIAGNOSIS — M160 Bilateral primary osteoarthritis of hip: Secondary | ICD-10-CM | POA: Diagnosis not present

## 2020-05-22 DIAGNOSIS — A419 Sepsis, unspecified organism: Secondary | ICD-10-CM | POA: Diagnosis not present

## 2020-05-22 DIAGNOSIS — Z8546 Personal history of malignant neoplasm of prostate: Secondary | ICD-10-CM | POA: Diagnosis not present

## 2020-05-22 DIAGNOSIS — R0600 Dyspnea, unspecified: Secondary | ICD-10-CM | POA: Diagnosis not present

## 2020-05-22 DIAGNOSIS — J90 Pleural effusion, not elsewhere classified: Secondary | ICD-10-CM | POA: Diagnosis present

## 2020-05-22 DIAGNOSIS — J849 Interstitial pulmonary disease, unspecified: Secondary | ICD-10-CM | POA: Diagnosis not present

## 2020-05-22 DIAGNOSIS — K5909 Other constipation: Secondary | ICD-10-CM | POA: Diagnosis not present

## 2020-05-22 DIAGNOSIS — G8929 Other chronic pain: Secondary | ICD-10-CM

## 2020-05-22 DIAGNOSIS — E7800 Pure hypercholesterolemia, unspecified: Secondary | ICD-10-CM | POA: Diagnosis present

## 2020-05-22 DIAGNOSIS — R2681 Unsteadiness on feet: Secondary | ICD-10-CM | POA: Diagnosis not present

## 2020-05-22 DIAGNOSIS — N289 Disorder of kidney and ureter, unspecified: Secondary | ICD-10-CM | POA: Diagnosis not present

## 2020-05-22 DIAGNOSIS — M25551 Pain in right hip: Secondary | ICD-10-CM

## 2020-05-22 DIAGNOSIS — G2581 Restless legs syndrome: Secondary | ICD-10-CM | POA: Diagnosis present

## 2020-05-22 DIAGNOSIS — D72829 Elevated white blood cell count, unspecified: Secondary | ICD-10-CM | POA: Diagnosis not present

## 2020-05-22 DIAGNOSIS — D62 Acute posthemorrhagic anemia: Secondary | ICD-10-CM | POA: Diagnosis not present

## 2020-05-22 DIAGNOSIS — S72011A Unspecified intracapsular fracture of right femur, initial encounter for closed fracture: Secondary | ICD-10-CM | POA: Diagnosis not present

## 2020-05-22 DIAGNOSIS — I517 Cardiomegaly: Secondary | ICD-10-CM | POA: Diagnosis not present

## 2020-05-22 DIAGNOSIS — R279 Unspecified lack of coordination: Secondary | ICD-10-CM | POA: Diagnosis not present

## 2020-05-22 DIAGNOSIS — K529 Noninfective gastroenteritis and colitis, unspecified: Secondary | ICD-10-CM | POA: Diagnosis not present

## 2020-05-22 DIAGNOSIS — K5150 Left sided colitis without complications: Secondary | ICD-10-CM | POA: Diagnosis present

## 2020-05-22 DIAGNOSIS — R079 Chest pain, unspecified: Secondary | ICD-10-CM | POA: Diagnosis not present

## 2020-05-22 DIAGNOSIS — R262 Difficulty in walking, not elsewhere classified: Secondary | ICD-10-CM | POA: Diagnosis not present

## 2020-05-22 DIAGNOSIS — K922 Gastrointestinal hemorrhage, unspecified: Secondary | ICD-10-CM

## 2020-05-22 DIAGNOSIS — E876 Hypokalemia: Secondary | ICD-10-CM | POA: Diagnosis not present

## 2020-05-22 DIAGNOSIS — N3281 Overactive bladder: Secondary | ICD-10-CM | POA: Diagnosis not present

## 2020-05-22 DIAGNOSIS — K828 Other specified diseases of gallbladder: Secondary | ICD-10-CM | POA: Diagnosis not present

## 2020-05-22 DIAGNOSIS — S72001D Fracture of unspecified part of neck of right femur, subsequent encounter for closed fracture with routine healing: Secondary | ICD-10-CM | POA: Diagnosis not present

## 2020-05-22 DIAGNOSIS — A09 Infectious gastroenteritis and colitis, unspecified: Secondary | ICD-10-CM | POA: Diagnosis not present

## 2020-05-22 DIAGNOSIS — S3993XA Unspecified injury of pelvis, initial encounter: Secondary | ICD-10-CM | POA: Diagnosis not present

## 2020-05-22 DIAGNOSIS — R197 Diarrhea, unspecified: Secondary | ICD-10-CM | POA: Diagnosis not present

## 2020-05-22 DIAGNOSIS — K559 Vascular disorder of intestine, unspecified: Secondary | ICD-10-CM | POA: Diagnosis present

## 2020-05-22 DIAGNOSIS — R000 Tachycardia, unspecified: Secondary | ICD-10-CM | POA: Diagnosis not present

## 2020-05-22 DIAGNOSIS — R1310 Dysphagia, unspecified: Secondary | ICD-10-CM | POA: Diagnosis not present

## 2020-05-22 DIAGNOSIS — M25552 Pain in left hip: Secondary | ICD-10-CM

## 2020-05-22 HISTORY — DX: Cardiac murmur, unspecified: R01.1

## 2020-05-22 HISTORY — DX: Major depressive disorder, single episode, unspecified: F32.9

## 2020-05-22 HISTORY — DX: Restless legs syndrome: G25.81

## 2020-05-22 HISTORY — DX: Generalized anxiety disorder: F41.1

## 2020-05-22 HISTORY — DX: Interstitial pulmonary disease, unspecified: J84.9

## 2020-05-22 HISTORY — DX: History of falling: Z91.81

## 2020-05-22 HISTORY — DX: Hyperlipidemia, unspecified: E78.5

## 2020-05-22 LAB — URINALYSIS, ROUTINE W REFLEX MICROSCOPIC
Bacteria, UA: NONE SEEN
Bilirubin Urine: NEGATIVE
Glucose, UA: 150 mg/dL — AB
Hgb urine dipstick: NEGATIVE
Ketones, ur: NEGATIVE mg/dL
Leukocytes,Ua: NEGATIVE
Nitrite: NEGATIVE
Protein, ur: >=300 mg/dL — AB
Specific Gravity, Urine: 1.016 (ref 1.005–1.030)
pH: 5 (ref 5.0–8.0)

## 2020-05-22 LAB — CBC WITH DIFFERENTIAL/PLATELET
Abs Immature Granulocytes: 0.1 10*3/uL — ABNORMAL HIGH (ref 0.00–0.07)
Abs Immature Granulocytes: 0.22 10*3/uL — ABNORMAL HIGH (ref 0.00–0.07)
Basophils Absolute: 0 10*3/uL (ref 0.0–0.1)
Basophils Absolute: 0.1 10*3/uL (ref 0.0–0.1)
Basophils Relative: 0 %
Basophils Relative: 0 %
Eosinophils Absolute: 0 10*3/uL (ref 0.0–0.5)
Eosinophils Absolute: 0.1 10*3/uL (ref 0.0–0.5)
Eosinophils Relative: 0 %
Eosinophils Relative: 0 %
HCT: 43.8 % (ref 39.0–52.0)
HCT: 43.4 % (ref 39.0–52.0)
Hemoglobin: 14.6 g/dL (ref 13.0–17.0)
Hemoglobin: 14.1 g/dL (ref 13.0–17.0)
Immature Granulocytes: 1 %
Immature Granulocytes: 1 %
Lymphocytes Relative: 4 %
Lymphocytes Relative: 5 %
Lymphs Abs: 0.8 10*3/uL (ref 0.7–4.0)
Lymphs Abs: 1.4 10*3/uL (ref 0.7–4.0)
MCH: 27.2 pg (ref 26.0–34.0)
MCH: 26.8 pg (ref 26.0–34.0)
MCHC: 33.3 g/dL (ref 30.0–36.0)
MCHC: 32.5 g/dL (ref 30.0–36.0)
MCV: 81.6 fL (ref 80.0–100.0)
MCV: 82.4 fL (ref 80.0–100.0)
Monocytes Absolute: 1.3 10*3/uL — ABNORMAL HIGH (ref 0.1–1.0)
Monocytes Absolute: 2.8 10*3/uL — ABNORMAL HIGH (ref 0.1–1.0)
Monocytes Relative: 7 %
Monocytes Relative: 10 %
Neutro Abs: 17.2 10*3/uL — ABNORMAL HIGH (ref 1.7–7.7)
Neutro Abs: 25.1 10*3/uL — ABNORMAL HIGH (ref 1.7–7.7)
Neutrophils Relative %: 88 %
Neutrophils Relative %: 84 %
Platelets: 254 10*3/uL (ref 150–400)
Platelets: 265 10*3/uL (ref 150–400)
RBC: 5.37 MIL/uL (ref 4.22–5.81)
RBC: 5.27 MIL/uL (ref 4.22–5.81)
RDW: 14.6 % (ref 11.5–15.5)
RDW: 15.1 % (ref 11.5–15.5)
WBC: 19.4 10*3/uL — ABNORMAL HIGH (ref 4.0–10.5)
WBC: 29.7 10*3/uL — ABNORMAL HIGH (ref 4.0–10.5)
nRBC: 0 % (ref 0.0–0.2)
nRBC: 0 % (ref 0.0–0.2)

## 2020-05-22 LAB — BASIC METABOLIC PANEL
Anion gap: 11 (ref 5–15)
BUN: 35 mg/dL — ABNORMAL HIGH (ref 8–23)
CO2: 23 mmol/L (ref 22–32)
Calcium: 9.8 mg/dL (ref 8.9–10.3)
Chloride: 104 mmol/L (ref 98–111)
Creatinine, Ser: 1.2 mg/dL (ref 0.61–1.24)
GFR, Estimated: 58 mL/min — ABNORMAL LOW (ref >60)
Glucose, Bld: 211 mg/dL — ABNORMAL HIGH (ref 70–99)
Potassium: 3 mmol/L — ABNORMAL LOW (ref 3.5–5.1)
Sodium: 138 mmol/L (ref 135–145)

## 2020-05-22 LAB — COMPREHENSIVE METABOLIC PANEL
ALT: 12 U/L (ref 0–44)
AST: 15 U/L (ref 15–41)
Albumin: 3.5 g/dL (ref 3.5–5.0)
Alkaline Phosphatase: 66 U/L (ref 38–126)
Anion gap: 8 (ref 5–15)
BUN: 33 mg/dL — ABNORMAL HIGH (ref 8–23)
CO2: 28 mmol/L (ref 22–32)
Calcium: 9.6 mg/dL (ref 8.9–10.3)
Chloride: 101 mmol/L (ref 98–111)
Creatinine, Ser: 1.19 mg/dL (ref 0.61–1.24)
GFR, Estimated: 59 mL/min — ABNORMAL LOW (ref >60)
Glucose, Bld: 159 mg/dL — ABNORMAL HIGH (ref 70–99)
Potassium: 3.4 mmol/L — ABNORMAL LOW (ref 3.5–5.1)
Sodium: 137 mmol/L (ref 135–145)
Total Bilirubin: 0.7 mg/dL (ref 0.3–1.2)
Total Protein: 6.2 g/dL — ABNORMAL LOW (ref 6.5–8.1)

## 2020-05-22 LAB — LIPASE, BLOOD: Lipase: 18 U/L (ref 11–51)

## 2020-05-22 LAB — LACTIC ACID, PLASMA
Lactic Acid, Venous: 2.1 mmol/L (ref 0.5–1.9)
Lactic Acid, Venous: 1.9 mmol/L (ref 0.5–1.9)

## 2020-05-22 LAB — PROTIME-INR
INR: 1.2 (ref 0.8–1.2)
Prothrombin Time: 14.5 seconds (ref 11.4–15.2)

## 2020-05-22 LAB — MAGNESIUM: Magnesium: 1.9 mg/dL (ref 1.7–2.4)

## 2020-05-22 LAB — AMYLASE: Amylase: 58 U/L (ref 28–100)

## 2020-05-22 LAB — APTT: aPTT: 28 seconds (ref 24–36)

## 2020-05-22 LAB — POC OCCULT BLOOD, ED: Fecal Occult Bld: POSITIVE — AB

## 2020-05-22 MED ORDER — VANCOMYCIN 50 MG/ML ORAL SOLUTION
ORAL | Status: AC
  Filled 2020-05-22: qty 5

## 2020-05-22 MED ORDER — IOHEXOL 300 MG/ML  SOLN
100.0000 mL | Freq: Once | INTRAMUSCULAR | Status: AC | PRN
  Administered 2020-05-22: 19:00:00 100 mL via INTRAVENOUS

## 2020-05-22 MED ORDER — ACETAMINOPHEN 325 MG PO TABS
650.0000 mg | ORAL_TABLET | Freq: Once | ORAL | Status: AC
  Administered 2020-05-22: 19:00:00 650 mg via ORAL
  Filled 2020-05-22: qty 2

## 2020-05-22 MED ORDER — LACTATED RINGERS IV BOLUS (SEPSIS)
1000.0000 mL | Freq: Once | INTRAVENOUS | Status: AC
  Administered 2020-05-22: 19:00:00 1000 mL via INTRAVENOUS

## 2020-05-22 MED ORDER — MORPHINE SULFATE (PF) 2 MG/ML IV SOLN
2.0000 mg | Freq: Once | INTRAVENOUS | Status: AC
  Administered 2020-05-22: 21:00:00 2 mg via INTRAVENOUS
  Filled 2020-05-22: qty 1

## 2020-05-22 MED ORDER — LACTATED RINGERS IV BOLUS (SEPSIS): 1000.0000 mL | Freq: Once | INTRAVENOUS | Status: DC

## 2020-05-22 MED ORDER — POTASSIUM CHLORIDE IN NACL 40-0.9 MEQ/L-% IV SOLN
INTRAVENOUS | Status: AC
  Filled 2020-05-22 (×3): qty 1000

## 2020-05-22 MED ORDER — VANCOMYCIN 50 MG/ML ORAL SOLUTION
125.0000 mg | Freq: Four times a day (QID) | ORAL | Status: DC
  Administered 2020-05-23 (×2): 125 mg via ORAL
  Filled 2020-05-22 (×7): qty 2.5

## 2020-05-22 MED ORDER — HYDRALAZINE HCL 25 MG PO TABS
100.0000 mg | ORAL_TABLET | Freq: Three times a day (TID) | ORAL | Status: DC
  Administered 2020-05-23 – 2020-06-01 (×29): 100 mg via ORAL
  Filled 2020-05-22 (×2): qty 4
  Filled 2020-05-22: qty 2
  Filled 2020-05-22: qty 4
  Filled 2020-05-22: qty 2
  Filled 2020-05-22 (×6): qty 4
  Filled 2020-05-22: qty 2
  Filled 2020-05-22 (×11): qty 4
  Filled 2020-05-22: qty 2
  Filled 2020-05-22 (×9): qty 4
  Filled 2020-05-22: qty 2

## 2020-05-22 MED ORDER — VENLAFAXINE HCL ER 75 MG PO CP24
75.0000 mg | ORAL_CAPSULE | Freq: Every day | ORAL | Status: DC
  Administered 2020-05-23 – 2020-06-01 (×10): 75 mg via ORAL
  Filled 2020-05-22 (×7): qty 1
  Filled 2020-05-22: qty 2
  Filled 2020-05-22 (×2): qty 1

## 2020-05-22 MED ORDER — ALFUZOSIN HCL ER 10 MG PO TB24
10.0000 mg | ORAL_TABLET | Freq: Every day | ORAL | Status: DC
  Administered 2020-05-23 – 2020-06-01 (×10): 10 mg via ORAL
  Filled 2020-05-22 (×11): qty 1

## 2020-05-22 MED ORDER — METRONIDAZOLE IN NACL 5-0.79 MG/ML-% IV SOLN
500.0000 mg | Freq: Once | INTRAVENOUS | Status: AC
  Administered 2020-05-22: 20:00:00 500 mg via INTRAVENOUS
  Filled 2020-05-22: qty 100

## 2020-05-22 MED ORDER — MORPHINE SULFATE (PF) 2 MG/ML IV SOLN
2.0000 mg | INTRAVENOUS | Status: DC | PRN
  Administered 2020-05-23 – 2020-05-27 (×10): 2 mg via INTRAVENOUS
  Filled 2020-05-22 (×10): qty 1

## 2020-05-22 MED ORDER — ONDANSETRON HCL 4 MG/2ML IJ SOLN
4.0000 mg | Freq: Once | INTRAMUSCULAR | Status: AC
  Administered 2020-05-22: 19:00:00 4 mg via INTRAVENOUS
  Filled 2020-05-22: qty 2

## 2020-05-22 MED ORDER — DILTIAZEM HCL ER COATED BEADS 180 MG PO CP24
360.0000 mg | ORAL_CAPSULE | Freq: Every day | ORAL | Status: DC
  Administered 2020-05-23 – 2020-06-01 (×10): 360 mg via ORAL
  Filled 2020-05-22 (×3): qty 2
  Filled 2020-05-22 (×2): qty 1
  Filled 2020-05-22 (×7): qty 2

## 2020-05-22 MED ORDER — LACTATED RINGERS IV SOLN: INTRAVENOUS | Status: DC

## 2020-05-22 MED ORDER — SODIUM CHLORIDE 0.9 % IV SOLN
2.0000 g | Freq: Once | INTRAVENOUS | Status: AC
  Administered 2020-05-22: 19:00:00 2 g via INTRAVENOUS
  Filled 2020-05-22: qty 2

## 2020-05-22 NOTE — Progress Notes (Signed)
Pharmacy Antibiotic Note  Jeffrey Sherman is a 86 y.o. male admitted on 05/22/2020 with sepsis and IAI.  Pharmacy has been consulted for Cefepime dosing.  CC/HPI: From [facility] with dizziness and fall, loose stools with red-tinged blood and abdominal pain.  PMH: diverticulitis, MDD, anxiety, prostate cancer, HTN, GERD, HLD, HOH, PUD, R hip fx 03/09/20.   ID: Sepsis/IAI.  - Tmax 100.3. WBC 19.4>>29.7 same day. Scr 1.2  Cefepime 12/13>>  Plan: Cefepime 2g IV q8hr    Height: 5\' 9"  (175.3 cm) Weight: 88 kg (194 lb) IBW/kg (Calculated) : 70.7  Temp (24hrs), Avg:99.1 F (37.3 C), Min:97.9 F (36.6 C), Max:100.3 F (37.9 C)  Recent Labs  Lab 05/22/20 0130 05/22/20 1805  WBC 19.4* 29.7*  CREATININE 1.20  --     Estimated Creatinine Clearance: 46.7 mL/min (by C-G formula based on SCr of 1.2 mg/dL).    No Known Allergies   Braidyn Scorsone S. Alford Highland, PharmD, BCPS Clinical Staff Pharmacist [email or web address] [place] 05/22/2020 7:12 PM

## 2020-05-22 NOTE — ED Provider Notes (Signed)
[facility] Provider Note   CSN: [id number] Arrival date & time: 05/22/20  5188     History Chief Complaint  Patient presents with  . Rectal Bleeding    Jeffrey Sherman is a 86 y.o. male.  HPI   86 year old male with history of anxiety, prostate cancer, chest wall pain, hypertension, GERD, high cholesterol, hard of hearing, peptic ulcer disease, who presents to the emergency department today for evaluation of abdominal pain and rectal bleeding.  States he started having nausea vomiting and diarrhea yesterday.  He states that he did have some bright red blood per rectum as well.  He does not believe he had any melena.  No hematochezia.  He is not sure if he has had any fevers.  He denies any upper respiratory symptoms.  He does have a history of diverticulitis and states his symptoms feel similar.  Past Medical History:  Diagnosis Date  . Anxiety   . Cancer [facility])    prostate  . Chronic chest wall pain   . Essential hypertension, benign 06/20/2016  . GAD (generalized anxiety disorder)   . GERD (gastroesophageal reflux disease)   . H/O echocardiogram 2016   normal  . Heart murmur    "leaking heart valve"  . High cholesterol 06/20/2016  . HOH (hard of hearing)    left  . Hx of falling   . Hyperlipidemia   . Interstitial pulmonary disease ([place])   . MDD (major depressive disorder)   . Murmur, cardiac   . Poor historian   . PUD (peptic ulcer disease)   . PUD (peptic ulcer disease)   . RLS (restless legs syndrome)   . Shortness of breath   . Wrist fracture 12/16/2011    Patient Active Problem List   Diagnosis Date Noted  . Restless leg syndrome 04/21/2020  . Protein-calorie malnutrition, severe ([place]) 03/22/2020  . Dementia without behavioral disturbance ([place]) 03/22/2020  . Iron deficiency anemia due to dietary causes 03/22/2020  . Prostate cancer ([place]) 03/15/2020  . Overactive bladder 03/15/2020  . Dyslipidemia 03/15/2020  . Major depression,  recurrent, chronic ([place]) 03/15/2020  . Interstitial lung disease ([place]) 03/15/2020  . Closed displaced fracture of right femoral neck ([place]) 03/10/20 cannulated screw fixation  03/09/2020  . Infectious colitis   . Acute blood loss as cause of postoperative anemia   . Rectal bleeding 11/06/2017  . Hypokalemia 11/06/2017  . Renal insufficiency 11/06/2017  . Essential hypertension 06/20/2016  . High cholesterol 06/20/2016  . Gastroesophageal reflux disease without esophagitis 06/20/2016  . Dyspnea 12/17/2013  . Chronic constipation 12/16/2011    Past Surgical History:  Procedure Laterality Date  . ESOPHAGOGASTRODUODENOSCOPY    . ESOPHAGOGASTRODUODENOSCOPY N/A 07/18/2016   Procedure: ESOPHAGOGASTRODUODENOSCOPY (EGD);  Surgeon: Rogene Houston, MD;  Location: [facility] ENDO SUITE;  Service: Endoscopy;  Laterality: N/A;  3:00-moved to 230 per Lelon Frohlich  . HIP PINNING,CANNULATED Right 03/10/2020   Procedure: INTERNAL FIXATION RIGHT HIP;  Surgeon: Carole Civil, MD;  Location: [facility] ORS;  Service: Orthopedics;  Laterality: Right;  . ORIF WRIST FRACTURE  12/19/2011   Procedure: OPEN REDUCTION INTERNAL FIXATION (ORIF) WRIST FRACTURE;  Surgeon: Carole Civil, MD;  Location: [facility] ORS;  Service: Orthopedics;  Laterality: Right;  . prostate cancer     Diagnosed in the 90s  . TONSILLECTOMY         Family History  Problem Relation Age of Onset  . Cancer Other   . Heart attack Mother   . Cancer Brother   .  Heart attack Brother   . Heart disease Sister        by pass    Social History   Tobacco Use  . Smoking status: Never Smoker  . Smokeless tobacco: Never Used  Vaping Use  . Vaping Use: Never used  Substance Use Topics  . Alcohol use: No  . Drug use: No    Home Medications Prior to Admission medications   Medication Sig Start Date End Date Taking? Authorizing Provider  acetaminophen (TYLENOL) 500 MG tablet Take 1,000 mg by mouth in the morning, at noon, and at bedtime. 03/20/20  Yes  [provider]  alfuzosin (UROXATRAL) 10 MG 24 hr tablet Take 10 mg by mouth daily with breakfast.   Yes [provider]  Amino Acids-Protein Hydrolys (PRO-STAT PO) liquid; 15-100 gram-kcal/30 mL; amt: 30 ml; oral Special Instructions: for albumin 2.5 With Meals 03/16/20  Yes [provider]  Ascorbic Acid (VITAMIN C) 1000 MG tablet Take 1,000 mg by mouth daily.   Yes [provider]  aspirin 81 MG chewable tablet Chew 81 mg by mouth daily. 04/15/20  Yes [provider]  Janne Lab Oil [facility]) OINT Special Instructions: Apply to bilateral buttocks & sacral area qshift for blanchable erythema. 03/15/20  Yes [provider]  calcium carbonate (TUMS - DOSED IN MG ELEMENTAL CALCIUM) 500 MG chewable tablet Chew 1 tablet by mouth 2 (two) times daily as needed for indigestion or heartburn.    Yes [provider]  cloNIDine (CATAPRES) 0.1 MG tablet Take 0.1 mg by mouth See admin instructions. Stat-immediately due to elavated blood pressure   Yes [provider]  diltiazem (CARDIZEM CD) 360 MG 24 hr capsule Take 360 mg by mouth daily. Start 03/23/2020   Yes [provider]  ferrous sulfate 325 (65 FE) MG tablet Take 325 mg by mouth daily with breakfast. For Anemia and  Iron Deficiency 03/17/20  Yes [provider]  Flax Oil-Fish Oil-Borage Oil (FISH OIL-FLAX OIL-BORAGE OIL) CAPS Take 1 capsule by mouth daily.   Yes [provider]  hydrALAZINE (APRESOLINE) 100 MG tablet Take 100 mg by mouth 3 (three) times daily.   Yes [provider]  lisinopril (ZESTRIL) 40 MG tablet Take 1 tablet (40 mg total) by mouth daily. 03/14/20  Yes Johnson, Clanford L, MD  Melatonin 10 MG TABS Take 10 mg by mouth at bedtime.   Yes [provider]  memantine (NAMENDA) 10 MG tablet Take 10 mg by mouth 2 (two) times daily.   Yes [provider]  omeprazole (PRILOSEC) 20 MG capsule Take 1 capsule (20 mg  total) by mouth daily before breakfast. 03/14/20  Yes Johnson, Clanford L, MD  polyethylene glycol (MIRALAX / GLYCOLAX) 17 g packet Take 17 g by mouth daily.   Yes [provider]  potassium chloride SA (KLOR-CON) 20 MEQ tablet Take 20 mEq by mouth 2 (two) times daily.  03/23/20  Yes [provider]  rOPINIRole (REQUIP) 0.25 MG tablet Take 0.25 mg by mouth at bedtime. 04/21/20  Yes [provider]  salicylic acid 17 % gel Apply 1 application topically in the morning and at bedtime. Apply to warts to left hand   Yes [provider]  senna-docusate (SENOKOT-S) 8.6-50 MG tablet Take 1 tablet by mouth at bedtime as needed for mild constipation. 03/14/20  Yes Johnson, Clanford L, MD  terazosin (HYTRIN) 5 MG capsule Take 5 mg by mouth at bedtime.  03/14/20  Yes [provider]  venlafaxine XR (EFFEXOR-XR) 150 MG 24 hr capsule Take 75 mg by mouth daily with breakfast. 05/17/20  Yes [provider]  NON FORMULARY Diet - Mechanical Soft 03/14/20   [provider]  OXYGEN Inhale 2 L into the lungs continuous. 03/14/20   [provider]    Allergies    Patient has no known allergies.  Review of Systems   Review of Systems  Constitutional: Positive for fatigue. Negative for chills and fever.  HENT: Negative for ear pain and sore throat.   Eyes: Negative for visual disturbance.  Respiratory: Negative for cough and shortness of breath.   Cardiovascular: Negative for chest pain.  Gastrointestinal: Positive for abdominal pain, blood in stool, diarrhea, nausea and vomiting. Negative for constipation.  Genitourinary: Negative for dysuria and hematuria.  Musculoskeletal: Negative for back pain.  Skin: Negative for rash.  Neurological: Negative for headaches.  All other systems reviewed and are negative.   Physical Exam Updated Vital Signs BP (!) 177/81   Pulse (!) 105   Temp 97.8 F (36.6 C) (Oral)   Resp 19   Ht 5\' 9"  (1.753 m)    Wt 88 kg   SpO2 94%   BMI 28.65 kg/m   Physical Exam Vitals and nursing note reviewed.  Constitutional:      Appearance: He is well-developed and well-nourished. He is ill-appearing.  HENT:     Head: Normocephalic and atraumatic.  Eyes:     Conjunctiva/sclera: Conjunctivae normal.  Cardiovascular:     Rate and Rhythm: Regular rhythm. Tachycardia present.     Heart sounds: Normal heart sounds. No murmur heard.   Pulmonary:     Effort: Pulmonary effort is normal. No respiratory distress.     Breath sounds: Normal breath sounds. No wheezing, rhonchi or rales.  Abdominal:     General: Bowel sounds are normal.     Palpations: Abdomen is soft.     Tenderness: There is abdominal tenderness (LLQ). There is guarding. There is no rebound.  Musculoskeletal:        General: No edema.     Cervical back: Neck supple.  Skin:    General: Skin is warm and dry.  Neurological:     Mental Status: He is alert.  Psychiatric:        Mood and Affect: Mood and affect normal.     ED Results / Procedures / Treatments   Labs (all labs ordered are listed, but only abnormal results are displayed) Labs Reviewed  LACTIC ACID, PLASMA - Abnormal; Notable for the following components:      Result Value   Lactic Acid, Venous 2.1 (*)    All other components within normal limits  COMPREHENSIVE METABOLIC PANEL - Abnormal; Notable for the following components:   Potassium 3.4 (*)    Glucose, Bld 159 (*)    BUN 33 (*)    Total Protein 6.2 (*)    GFR, Estimated 59 (*)    All other components within normal limits  CBC WITH DIFFERENTIAL/PLATELET - Abnormal; Notable for the following components:   WBC 29.7 (*)    Neutro Abs 25.1 (*)    Monocytes Absolute 2.8 (*)    Abs Immature Granulocytes 0.22 (*)    All other components within normal limits  POC OCCULT BLOOD, ED - Abnormal; Notable for the following components:   Fecal Occult Bld POSITIVE (*)    All other components within normal limits  CULTURE,  BLOOD (ROUTINE X 2)  CULTURE, BLOOD (ROUTINE X  2)  URINE CULTURE  C DIFFICILE QUICK SCREEN W PCR REFLEX  PROTIME-INR  APTT  LACTIC ACID, PLASMA  URINALYSIS, ROUTINE W REFLEX MICROSCOPIC  TYPE AND SCREEN    EKG None  Radiology CT ABDOMEN PELVIS W CONTRAST  Result Date: 05/22/2020 CLINICAL DATA:  Abdominal pain, fever, rectal bleeding EXAM: CT ABDOMEN AND PELVIS WITH CONTRAST TECHNIQUE: Multidetector CT imaging of the abdomen and pelvis was performed using the standard protocol following bolus administration of intravenous contrast. CONTRAST:  115mL OMNIPAQUE IOHEXOL 300 MG/ML  SOLN COMPARISON:  May 2019 FINDINGS: Lower chest: Bibasilar atelectasis/scarring. Hepatobiliary: Small cyst of the left hepatic lobe. Few small layering gallstones. No biliary dilatation. Pancreas: Unremarkable. Spleen: Unremarkable. Adrenals/Urinary Tract: Adrenal glands are unremarkable. Bilateral renal cysts. 3 mm nonobstructing calculus of the lower pole the right kidney. Small bladder diverticula. Stomach/Bowel: Stomach is within normal limits. Distal colonic diverticulosis. There is wall thickening along the distal transverse colon, splenic flexure, and descending colon. Vascular/Lymphatic: Aortic atherosclerosis. No enlarged lymph nodes. Reproductive: Unremarkable. Other: Small volume abdominopelvic ascites. No acute abnormality of the abdominal wall. Musculoskeletal: Right femoral fixation with associated streak artifact. Degenerative changes of the lumbar spine. No acute osseous abnormality. IMPRESSION: Findings consistent with colitis involving the distal transverse colon, splenic flexure, and descending colon. This is likely ischemic or infectious in etiology. Diverticulosis is present but mostly distal to the area of thickening and diverticulitis is therefore less likely. 3 mm nonobstructing left renal calculus. Small bladder diverticula. Electronically Signed   By: Macy Mis M.D.   On: 05/22/2020 20:12    DG Chest Port 1 View  Result Date: 05/22/2020 CLINICAL DATA:  Questionable sepsis - evaluate for abnormality Patient reports dizziness leading to fall and abdominal pain. EXAM: PORTABLE CHEST 1 VIEW COMPARISON:  Radiograph 03/12/2020 FINDINGS: Improved cardiomegaly from prior. Unchanged mediastinal contours with aortic atherosclerosis and tortuosity. Chronic interstitial coarsening. Streaky atelectasis at the right lung base. No confluent consolidation. No pulmonary edema or large pleural effusion. No pneumothorax. No acute osseous abnormalities are seen. IMPRESSION: 1. Mild right basilar atelectasis. 2. Improved cardiomegaly from prior. Stable aortic atherosclerosis and tortuosity. Electronically Signed   By: Keith Rake M.D.   On: 05/22/2020 18:50    Procedures Procedures (including critical care time)  CRITICAL CARE Performed by: Rodney Booze   Total critical care time: 32 minutes  Critical care time was exclusive of separately billable procedures and treating other patients.  Critical care was necessary to treat or prevent imminent or life-threatening deterioration.  Critical care was time spent personally by me on the following activities: development of treatment plan with patient and/or surrogate as well as nursing, discussions with consultants, evaluation of patient's response to treatment, examination of patient, obtaining history from patient or surrogate, ordering and performing treatments and interventions, ordering and review of laboratory studies, ordering and review of radiographic studies, pulse oximetry and re-evaluation of patient's condition.   Medications Ordered in ED Medications  lactated ringers infusion ( Intravenous Rate/Dose Verify 05/22/20 2002)  ondansetron (ZOFRAN) injection 4 mg (4 mg Intravenous Given by Other 05/22/20 1835)  iohexol (OMNIPAQUE) 300 MG/ML solution 100 mL (100 mLs Intravenous Contrast Given 05/22/20 1919)  lactated ringers bolus  1,000 mL ( Intravenous Restarted 05/22/20 2047)  ceFEPIme (MAXIPIME) 2 g in sodium chloride 0.9 % 100 mL IVPB (0 g Intravenous Stopped 05/22/20 2000)  metroNIDAZOLE (FLAGYL) IVPB 500 mg (0 mg Intravenous Stopped 05/22/20 2047)  acetaminophen (TYLENOL) tablet 650 mg (650 mg Oral Given 05/22/20 1856)  morphine  2 MG/ML injection 2 mg (2 mg Intravenous Given 05/22/20 2046)    ED Course  I have reviewed the triage vital signs and the nursing notes.  Pertinent labs & imaging results that were available during my care of the patient were reviewed by me and considered in my medical decision making (see chart for details).    MDM Rules/Calculators/A&P                          86 year old male presenting the emergency department today for evaluation of abdominal pain, vomiting, diarrhea and rectal bleeding.  He arrives borderline febrile, tachycardic, tachypneic.  Code sepsis was initiated based on patient's initial presentation.  Fluids were ordered and IV antibiotics were ordered for suspected intra-abdominal infection.    Reviewed/interpreted labs CBC with leukocytosis, no sig anemia CMP is essentially at abseline Coags neg Hemoccult positive Lactic marginally elevated Blood cultures   ekg sinus tach, LAD, abnormal rwave progress, nonspecific t abnormalities, borderline prolonged qt  cxr -  1. Mild right basilar atelectasis. 2. Improved cardiomegaly from prior. Stable aortic atherosclerosis and tortuosity.  Ct abd/pelvis -  Findings consistent with colitis involving the distal transverse colon, splenic flexure, and descending colon. This is likely ischemic or infectious in etiology. Diverticulosis is present but mostly distal to the area of thickening and diverticulitis is therefore less likely. 3 mm nonobstructing left renal calculus. Small bladder diverticula.  Pt with sepsis from colitis. Ivf, abx, admit.  8:49 PM CONSULT with Dr. Denton Brick who accepts patient for admission  Final  Clinical Impression(s) / ED Diagnoses Final diagnoses:  Colitis    Rx / DC Orders ED Discharge Orders    None       Bishop Dublin 05/22/20 2054    Tiant Ferguson, MD 05/23/20 2215

## 2020-05-22 NOTE — ED Notes (Signed)
Date and time results received: 05/22/20 now (use smartphrase ".now" to insert current time)  Test: Lactic Acid Critical Value: 2.1    Name of Provider Notified: Dr. Roderic Palau.

## 2020-05-22 NOTE — ED Triage Notes (Signed)
Pt brought over from [facility] via stretcher with reports of pt becoming dizzy last night and then falling. Pt then began having loose stools with red tinged blood. Pt c/o abdominal pain all day. WBC 19.4, Hgb 14.6, protein in urine >300 per nursing home staff from labs today. Pt reports hx of diverticulitis with similar symptoms.

## 2020-05-22 NOTE — ED Notes (Signed)
Due to pat admission status, vital signs will be performed routine.

## 2020-05-22 NOTE — ED Notes (Signed)
Notified pharmacy of need for medications and IV fluids.

## 2020-05-22 NOTE — Progress Notes (Signed)
Location:  [place] Room Number: 156/D Place of Service:  SNF (31)   CODE STATUS: DNR  No Known Allergies  Chief Complaint  Patient presents with   Acute Visit    Nausea and Vomiting     HPI:  Overnight he had a "huge" bowel movement X2; and had a vagal response. He has had 2 diarrheal stools this AM. He is complaining of nausea; denies vomiting. Does have abdominal pain. His blood pressure is very high at 200/100. He denies any chest pain; no visual changes.   Past Medical History:  Diagnosis Date   Anxiety    Cancer [facility])    prostate   Chronic chest wall pain    Essential hypertension, benign 06/20/2016   GERD (gastroesophageal reflux disease)    H/O echocardiogram 2016   normal   Heart murmur    "leaking heart valve"   High cholesterol 06/20/2016   HOH (hard of hearing)    left   Poor historian    PUD (peptic ulcer disease)    Shortness of breath    Wrist fracture 12/16/2011    Past Surgical History:  Procedure Laterality Date   ESOPHAGOGASTRODUODENOSCOPY     ESOPHAGOGASTRODUODENOSCOPY N/A 07/18/2016   Procedure: ESOPHAGOGASTRODUODENOSCOPY (EGD);  Surgeon: Rogene Houston, MD;  Location: [facility] ENDO SUITE;  Service: Endoscopy;  Laterality: N/A;  3:00-moved to 230 per [place] Right 03/10/2020   Procedure: INTERNAL FIXATION RIGHT HIP;  Surgeon: Carole Civil, MD;  Location: [facility] ORS;  Service: Orthopedics;  Laterality: Right;   ORIF WRIST FRACTURE  12/19/2011   Procedure: OPEN REDUCTION INTERNAL FIXATION (ORIF) WRIST FRACTURE;  Surgeon: Carole Civil, MD;  Location: [facility] ORS;  Service: Orthopedics;  Laterality: Right;   prostate cancer     Diagnosed in the 90s   TONSILLECTOMY      Social History   Socioeconomic History   Marital status: Widowed    Spouse name: Not on file   Number of children: Not on file   Years of education: Not on file   Highest education level: Not on file   Occupational History   Occupation: retired    Fish farm manager: RETIRED  Tobacco Use   Smoking status: Never Smoker   Smokeless tobacco: Never Used  Scientific laboratory technician Use: Never used  Substance and Sexual Activity   Alcohol use: No   Drug use: No   Sexual activity: Not on file  Other Topics Concern   Not on file  Social History Narrative   Not on file   Social Determinants of Health   Financial Resource Strain: Not on file  Food Insecurity: Not on file  Transportation Needs: Not on file  Physical Activity: Not on file  Stress: Not on file  Social Connections: Not on file  Intimate Partner Violence: Not on file   Family History  Problem Relation Age of Onset   Cancer Other    Heart attack Mother    Cancer Brother    Heart attack Brother    Heart disease Sister        by pass      VITAL SIGNS BP (!) 200/100    Pulse (!) 58    Temp 97.9 F (36.6 C)    Resp 20    Ht 5\' 9"  (1.753 m)    Wt 194 lb 6.4 oz (88.2 kg)    SpO2 97%    BMI 28.71 kg/m   Outpatient Encounter  Medications as of 05/22/2020  Medication Sig   acetaminophen (TYLENOL) 500 MG tablet Take 1,000 mg by mouth in the morning, at noon, and at bedtime.   alfuzosin (UROXATRAL) 10 MG 24 hr tablet Take 10 mg by mouth daily with breakfast.   Amino Acids-Protein Hydrolys (PRO-STAT PO) liquid; 15-100 gram-kcal/30 mL; amt: 30 ml; oral Special Instructions: for albumin 2.5 With Meals   Ascorbic Acid (VITAMIN C) 1000 MG tablet Take 1,000 mg by mouth daily.   aspirin 81 MG chewable tablet Chew 81 mg by mouth daily.   Balsam [place]-Castor Oil [facility]) OINT Special Instructions: Apply to bilateral buttocks & sacral area qshift for blanchable erythema.   calcium carbonate (TUMS - DOSED IN MG ELEMENTAL CALCIUM) 500 MG chewable tablet Chew 1 tablet by mouth 2 (two) times daily as needed for indigestion or heartburn.    diltiazem (CARDIZEM CD) 360 MG 24 hr capsule Take 360 mg by mouth daily. Start 03/23/2020    ferrous sulfate 325 (65 FE) MG tablet Take 325 mg by mouth daily with breakfast. For Anemia and  Iron Deficiency   Flax Oil-Fish Oil-Borage Oil (FISH OIL-FLAX OIL-BORAGE OIL) CAPS Take 1 capsule by mouth daily.   hydrALAZINE (APRESOLINE) 100 MG tablet Take 100 mg by mouth 3 (three) times daily.   lisinopril (ZESTRIL) 40 MG tablet Take 1 tablet (40 mg total) by mouth daily.   Melatonin 10 MG TABS Take 10 mg by mouth at bedtime.   memantine (NAMENDA) 10 MG tablet Take 10 mg by mouth 2 (two) times daily.   NON FORMULARY Diet - Mechanical Soft   omeprazole (PRILOSEC) 20 MG capsule Take 1 capsule (20 mg total) by mouth daily before breakfast.   OXYGEN Inhale 2 L into the lungs continuous.   polyethylene glycol (MIRALAX / GLYCOLAX) 17 g packet Take 17 g by mouth daily.   potassium chloride SA (KLOR-CON) 20 MEQ tablet Take 20 mEq by mouth 2 (two) times daily.    rOPINIRole (REQUIP) 0.25 MG tablet Take 0.25 mg by mouth at bedtime.   salicylic acid 17 % gel Apply 1 application topically in the morning and at bedtime. Apply to warts to left hand   senna-docusate (SENOKOT-S) 8.6-50 MG tablet Take 1 tablet by mouth at bedtime as needed for mild constipation.   terazosin (HYTRIN) 5 MG capsule Take 5 mg by mouth at bedtime.    venlafaxine XR (EFFEXOR-XR) 150 MG 24 hr capsule Take 150 mg by mouth daily with breakfast.   [DISCONTINUED] venlafaxine XR (EFFEXOR-XR) 75 MG 24 hr capsule Take 75 mg by mouth daily.   No facility-administered encounter medications on file as of 05/22/2020.     SIGNIFICANT DIAGNOSTIC EXAMS   PREVIOUS   03-09-20: right hip x-ray: Minimally displaced right femoral neck fracture.  03-09-20: chest x-ray: Peribronchial thickening with streaky perihilar infiltrates suggesting bronchitis or multifocal pneumonia.  03-12-20: renal ultrasound: No acute abnormality. Bilateral renal cysts  03-12-20: chest x-ray: Lower lung volumes. No acute cardiopulmonary abnormality.    NO NEW EXAMS.   LABS REVIEWED PREVIOUS    03-09-20: wbc 13.7; hgb 12.1; hct 37.7; mcv 80.4 plt 205; glucose 208; bun 21; creat 1.50; k+ 3.2; na++ 141; ca 9.2; hgb a1c 5.9; HIV: nr 03-10-20: wbc 11.2; hgb 11.3; hct 35.9; mcv 82.7 plt 188; glucose 160; bun 18; creat 1.21; k+ 3.3; na++ 139; ca 8.8 liver normal 3.0; mag 2.3 03-12-20: wbc 10.4; hgb 9.3; hct 29.6; mcv 84.3 plt 178; glucose 138; bun 34; creat 1.77; k+ 3.8;  na++ 138; ca 8.3 03-14-20: glucose 134; bun 36; creat 1.23; k+ 3.2; na++ 139; ca 8.8 mag 2.1   03-16-20: hgb 10.7; hct 33.4 glucose 127; bun 18; creat 0.86 k+ 3.3; na++ 139; ca 9.2 ;liver normal albumin 2.5 iron 23; tibc 221; ferritin 137 03-23-20: k+ 3.0  03-27-20: glucose 133; bun 34; creat 0.85; k+ 3.6; na++ 142; ca 9.0; AM cortisol: 10.6   TODAY.    05-22-20: wbc 19.4; hgb 14.6; hct 43.8; mcv 81.6 plt 254; glucose 211; bun 35; creat 1.20; k+ 3.0; na++ 138; ca 9.8   Review of Systems  Constitutional: Negative for malaise/fatigue.  Respiratory: Negative for cough and shortness of breath.   Cardiovascular: Negative for chest pain, palpitations and leg swelling.  Gastrointestinal: Positive for abdominal pain, constipation, diarrhea and nausea. Negative for heartburn.  Musculoskeletal: Negative for back pain, joint pain and myalgias.  Skin: Negative.   Neurological: Negative for dizziness.  Psychiatric/Behavioral: The patient is not nervous/anxious.     Physical Exam Constitutional:      General: He is not in acute distress.    Appearance: He is well-developed and well-nourished. He is not diaphoretic.  HENT:     Ears:     Comments: HOH L>R Neck:     Thyroid: No thyromegaly.  Cardiovascular:     Rate and Rhythm: Normal rate and regular rhythm.     Pulses: Normal pulses and intact distal pulses.     Heart sounds: Normal heart sounds.  Pulmonary:     Effort: Pulmonary effort is normal. No respiratory distress.     Breath sounds: Normal breath sounds.  Abdominal:      General: Bowel sounds are normal. There is no distension.     Palpations: Abdomen is soft.     Tenderness: There is abdominal tenderness.  Musculoskeletal:        General: No edema.     Cervical back: Neck supple.     Right lower leg: No edema.     Left lower leg: No edema.     Comments: Is able to move all extremities  Status post right hip internal fixation 03-10-20         Lymphadenopathy:     Cervical: No cervical adenopathy.  Skin:    General: Skin is warm and dry.  Neurological:     Mental Status: He is alert and oriented to person, place, and time.  Psychiatric:        Mood and Affect: Mood and affect and mood normal.       ASSESSMENT/ PLAN:  TODAY  1. Constipation 2. Gastritis:  Is worse Will get kub Blood culture and urine cultures Will give k+ 40 meq one time Will begin clear liquid diet Will monitor his status.   MD is aware of resident's narcotic use and is in agreement with current plan of care. We will attempt to wean resident as appropriate.  Ok Edwards NP [facility]  Contact [phone] Monday through Friday 8am- 5pm  After hours call [phone]

## 2020-05-22 NOTE — H&P (Signed)
History and Physical    Jeffrey Sherman IOE:[id number] DOB: [birth year] DOA: 05/22/2020  PCP: Celene Squibb, MD   Patient coming from: [facility] center.  I have personally briefly reviewed patient's old medical records in [place]  Chief Complaint: Abdominal pain, Diarrhea  HPI: Jeffrey Sherman is a 86 y.o. male with medical history significant for prostate cancer, dementia, hypertension, peptic ulcer disease. Brought to the ED from nursing home with reports of abdominal pain, diarrhea.  Patient's symptoms started yesterday, with nausea vomiting and diarrhea.  There was some bright red blood in his stool.  He also reports vomiting.  Patient reports abdominal pain was very severe, and all over his abdomen particularly left side of abdomen. Reports similar presentation 2 years ago.  Patient also had hip fracture and repair October this year.  He reports he still having persistent pain in his right hip and is unable to bear weight, pain is so severe he cannot sleep.  Currently ambulating with a wheelchair.  ED Course:, Tachycardic to 116, intermittent tachypnea to 24, blood pressure systolic 182X to 937J, O2 sats greater than 91% on room air.  Potassium 3.4.  WBC 29.  Abd Ct-consistent with colitis involving the distal transverse, splenic flexure and descending colon-infectious versus h ischemic etiology.  Portable chest x-ray unremarkable.  IV cefepime and metronidazole.  Hospitalist to admit for likely infectious colitis.  Review of Systems: As per HPI all other systems reviewed and negative.  Past Medical History:  Diagnosis Date  . Anxiety   . Cancer [facility])    prostate  . Chronic chest wall pain   . Essential hypertension, benign 06/20/2016  . GAD (generalized anxiety disorder)   . GERD (gastroesophageal reflux disease)   . H/O echocardiogram 2016   normal  . Heart murmur    "leaking heart valve"  . High cholesterol 06/20/2016  . HOH (hard of hearing)    left  . Hx of falling    . Hyperlipidemia   . Interstitial pulmonary disease ([place])   . MDD (major depressive disorder)   . Murmur, cardiac   . Poor historian   . PUD (peptic ulcer disease)   . PUD (peptic ulcer disease)   . RLS (restless legs syndrome)   . Shortness of breath   . Wrist fracture 12/16/2011    Past Surgical History:  Procedure Laterality Date  . ESOPHAGOGASTRODUODENOSCOPY    . ESOPHAGOGASTRODUODENOSCOPY N/A 07/18/2016   Procedure: ESOPHAGOGASTRODUODENOSCOPY (EGD);  Surgeon: Rogene Houston, MD;  Location[facility] ENDO SU[facility];  Service: Endoscopy;  Laterality: N/A;  3:00-moved to 230 per [place] Frohlich  . HIP PINNING,CANNULATED Right 03/10/2020   Procedure: INTERNAL FIXATION RIGHT HIP;  Surgeon: Carole Civil, MD;  Location: [facility] ORS;  Service: Orthopedics;  Laterality: Right;  . ORIF WRIST FRACTURE  12/19/2011   Procedure: OPEN REDUCTION INTERNAL FIXATION (ORIF) WRIST FRACTURE;  Surgeon: Carole Civil, MD;  Location: [facility] ORS;  Service: Orthopedics;  Laterality: Right;  . prostate cancer     Diagnosed in the 90s  . TONSILLECTOMY       reports that he has never smoked. He has never used smokeless tobacco. He reports that he does not drink alcohol and does not use drugs.  No Known Allergies  Family History  Problem Relation Age of Onset  . Cancer Other   . Heart attack Mother   . Cancer Brother   . Heart attack Brother   . Heart disease Sister  by pass    Prior to Admission medications   Medication Sig Start Date End Date Taking? Authorizing Provider  acetaminophen (TYLENOL) 500 MG tablet Take 1,000 mg by mouth in the morning, at noon, and at bedtime. 03/20/20  Yes [provider]  alfuzosin (UROXATRAL) 10 MG 24 hr tablet Take 10 mg by mouth daily with breakfast.   Yes [provider]  Amino Acids-Protein Hydrolys (PRO-STAT PO) liquid; 15-100 gram-kcal/30 mL; amt: 30 ml; oral Special Instructions: for albumin 2.5 With Meals 03/16/20  Yes [provider]  Ascorbic  Acid (VITAMIN C) 1000 MG tablet Take 1,000 mg by mouth daily.   Yes [provider]  aspirin 81 MG chewable tablet Chew 81 mg by mouth daily. 04/15/20  Yes [provider]  Janne Lab Oil [facility]) OINT Special Instructions: Apply to bilateral buttocks & sacral area qshift for blanchable erythema. 03/15/20  Yes [provider]  calcium carbonate (TUMS - DOSED IN MG ELEMENTAL CALCIUM) 500 MG chewable tablet Chew 1 tablet by mouth 2 (two) times daily as needed for indigestion or heartburn.    Yes [provider]  cloNIDine (CATAPRES) 0.1 MG tablet Take 0.1 mg by mouth See admin instructions. Stat-immediately due to elavated blood pressure   Yes [provider]  diltiazem (CARDIZEM CD) 360 MG 24 hr capsule Take 360 mg by mouth daily. Start 03/23/2020   Yes [provider]  ferrous sulfate 325 (65 FE) MG tablet Take 325 mg by mouth daily with breakfast. For Anemia and  Iron Deficiency 03/17/20  Yes [provider]  Flax Oil-Fish Oil-Borage Oil (FISH OIL-FLAX OIL-BORAGE OIL) CAPS Take 1 capsule by mouth daily.   Yes [provider]  hydrALAZINE (APRESOLINE) 100 MG tablet Take 100 mg by mouth 3 (three) times daily.   Yes [provider]  lisinopril (ZESTRIL) 40 MG tablet Take 1 tablet (40 mg total) by mouth daily. 03/14/20  Yes Johnson, Clanford L, MD  Melatonin 10 MG TABS Take 10 mg by mouth at bedtime.   Yes [provider]  memantine (NAMENDA) 10 MG tablet Take 10 mg by mouth 2 (two) times daily.   Yes [provider]  omeprazole (PRILOSEC) 20 MG capsule Take 1 capsule (20 mg total) by mouth daily before breakfast. 03/14/20  Yes Johnson, Clanford L, MD  polyethylene glycol (MIRALAX / GLYCOLAX) 17 g packet Take 17 g by mouth daily.   Yes [provider]  potassium chloride SA (KLOR-CON) 20 MEQ tablet Take 20 mEq by mouth 2 (two) times daily.  03/23/20  Yes [provider]  rOPINIRole  (REQUIP) 0.25 MG tablet Take 0.25 mg by mouth at bedtime. 04/21/20  Yes [provider]  salicylic acid 17 % gel Apply 1 application topically in the morning and at bedtime. Apply to warts to left hand   Yes [provider]  senna-docusate (SENOKOT-S) 8.6-50 MG tablet Take 1 tablet by mouth at bedtime as needed for mild constipation. 03/14/20  Yes Johnson, Clanford L, MD  terazosin (HYTRIN) 5 MG capsule Take 5 mg by mouth at bedtime.  03/14/20  Yes [provider]  venlafaxine XR (EFFEXOR-XR) 150 MG 24 hr capsule Take 75 mg by mouth daily with breakfast. 05/17/20  Yes [provider]  NON FORMULARY Diet - Mechanical Soft 03/14/20   [provider]  OXYGEN Inhale 2 L into the lungs continuous. 03/14/20   [provider]    Physical Exam: Vitals:   05/22/20 1900 05/22/20 1930  05/22/20 1935 05/22/20 2001  BP: 130/66 (!) 185/79 (!) 177/81   Pulse: 95 (!) 107 (!) 105   Resp: (!) 24 19 19    Temp:    97.8 F (36.6 C)  TempSrc:    Oral  SpO2: 95% 93% 94%   Weight:      Height:        Constitutional: NAD, calm, comfortable Vitals:   05/22/20 1900 05/22/20 1930 05/22/20 1935 05/22/20 2001  BP: 130/66 (!) 185/79 (!) 177/81   Pulse: 95 (!) 107 (!) 105   Resp: (!) 24 19 19    Temp:    97.8 F (36.6 C)  TempSrc:    Oral  SpO2: 95% 93% 94%   Weight:      Height:       Eyes: PERRL, lids and conjunctivae normal ENMT: Mucous membranes are moist. Posterior pharynx clear of any exudate or lesions.Normal dentition.  Neck: normal, supple, no masses, no thyromegaly Respiratory: clear to auscultation bilaterally, no wheezing, no crackles. Normal respiratory effort. No accessory muscle use.  Cardiovascular: Regular rate and rhythm, no murmurs / rubs / gallops. No extremity edema. 2+ pedal pulses. No carotid bruits.  Abdomen: no tenderness, no masses palpated. No hepatosplenomegaly. Bowel sounds positive.  Musculoskeletal: no clubbing / cyanosis. No  joint deformity upper and lower extremities. Good ROM, no contractures. Normal muscle tone.  Skin: no rashes, lesions, ulcers. No induration Neurologic: CN 2-12 grossly intact. Sensation intact, DTR normal. Strength 5/5 in all 4.  Psychiatric: Normal judgment and insight. Alert and oriented x 3. Normal mood.   Labs on Admission: I have personally reviewed following labs and imaging studies  CBC: Recent Labs  Lab 05/22/20 0130 05/22/20 1805  WBC 19.4* 29.7*  NEUTROABS 17.2* 25.1*  HGB 14.6 14.1  HCT 43.8 43.4  MCV 81.6 82.4  PLT 254 161   Basic Metabolic Panel: Recent Labs  Lab 05/22/20 0130 05/22/20 1805  NA 138 137  K 3.0* 3.4*  CL 104 101  CO2 23 28  GLUCOSE 211* 159*  BUN 35* 33*  CREATININE 1.20 1.19  CALCIUM 9.8 9.6   Liver Function Tests: Recent Labs  Lab 05/22/20 1805  AST 15  ALT 12  ALKPHOS 66  BILITOT 0.7  PROT 6.2*  ALBUMIN 3.5   Recent Labs  Lab 05/22/20 1225  LIPASE 18  AMYLASE 58   Coagulation Profile: Recent Labs  Lab 05/22/20 1805  INR 1.2   Urine analysis:    Component Value Date/Time   COLORURINE YELLOW 05/22/2020 [place] 05/22/2020 1111   APPEARANCEUR Clear 02/15/2020 1321   LABSPEC 1.016 05/22/2020 1111   PHURINE 5.0 05/22/2020 1111   GLUCOSEU 150 (A) 05/22/2020 1111   HGBUR NEGATIVE 05/22/2020 1111   BILIRUBINUR NEGATIVE 05/22/2020 1111   BILIRUBINUR Negative 02/15/2020 1321   KETONESUR NEGATIVE 05/22/2020 1111   PROTEINUR >=300 (A) 05/22/2020 1111   UROBILINOGEN negative (A) 10/19/2019 0958   UROBILINOGEN 0.2 05/08/2014 2045   NITRITE NEGATIVE 05/22/2020 1111   LEUKOCYTESUR NEGATIVE 05/22/2020 1111    Radiological Exams on Admission: CT ABDOMEN PELVIS W CONTRAST  Result Date: 05/22/2020 CLINICAL DATA:  Abdominal pain, fever, rectal bleeding EXAM: CT ABDOMEN AND PELVIS WITH CONTRAST TECHNIQUE: Multidetector CT imaging of the abdomen and pelvis was performed using the standard protocol following  bolus administration of intravenous contrast. CONTRAST:  129mL OMNIPAQUE IOHEXOL 300 MG/ML  SOLN COMPARISON:  May 2019 FINDINGS: Lower chest: Bibasilar atelectasis/scarring. Hepatobiliary: Small cyst of the left hepatic lobe.  Few small layering gallstones. No biliary dilatation. Pancreas: Unremarkable. Spleen: Unremarkable. Adrenals/Urinary Tract: Adrenal glands are unremarkable. Bilateral renal cysts. 3 mm nonobstructing calculus of the lower pole the right kidney. Small bladder diverticula. Stomach/Bowel: Stomach is within normal limits. Distal colonic diverticulosis. There is wall thickening along the distal transverse colon, splenic flexure, and descending colon. Vascular/Lymphatic: Aortic atherosclerosis. No enlarged lymph nodes. Reproductive: Unremarkable. Other: Small volume abdominopelvic ascites. No acute abnormality of the abdominal wall. Musculoskeletal: Right femoral fixation with associated streak artifact. Degenerative changes of the lumbar spine. No acute osseous abnormality. IMPRESSION: Findings consistent with colitis involving the distal transverse colon, splenic flexure, and descending colon. This is likely ischemic or infectious in etiology. Diverticulosis is present but mostly distal to the area of thickening and diverticulitis is therefore less likely. 3 mm nonobstructing left renal calculus. Small bladder diverticula. Electronically Signed   By: Macy Mis M.D.   On: 05/22/2020 20:12   DG Chest Port 1 View  Result Date: 05/22/2020 CLINICAL DATA:  Questionable sepsis - evaluate for abnormality Patient reports dizziness leading to fall and abdominal pain. EXAM: PORTABLE CHEST 1 VIEW COMPARISON:  Radiograph 03/12/2020 FINDINGS: Improved cardiomegaly from prior. Unchanged mediastinal contours with aortic atherosclerosis and tortuosity. Chronic interstitial coarsening. Streaky atelectasis at the right lung base. No confluent consolidation. No pulmonary edema or large pleural effusion. No  pneumothorax. No acute osseous abnormalities are seen. IMPRESSION: 1. Mild right basilar atelectasis. 2. Improved cardiomegaly from prior. Stable aortic atherosclerosis and tortuosity. Electronically Signed   By: Keith Rake M.D.   On: 05/22/2020 18:50    EKG: Independently reviewed.  Tachycardic- 112 otherwise without significant change..  QTc prolonged 490.  Assessment/Plan Principal Problem:   Sepsis ([place]) Active Problems:   Infectious colitis   Essential hypertension   Prostate cancer ([place])   Major depression, recurrent, chronic (HCC)   Dementia without behavioral disturbance (HCC)   Restless leg syndrome   Sepsis, infectious colitis-with diarrhea, vomiting and abdominal pain.  Leukocytosis of 29, tachycardic to 116, tachypneic, with temp of 100.3.  Lactic acid 2.1.  Abdominal CT-colitis involving distal transverse splenic flexure and sigmoid colon.  More likely infectious considering SIRs.  -Will start oral vancomycin prophylactically pending stool C. difficile test, considering significant increase in leukocytosis, nursing home resident -Follow-up stool C. difficile -Continue IV ceftriaxone and metronidazole -Bowel rest with clear liquid diet - 1 L bolus given ,continue N/s+ 40 KCL 100cc/hr x 20 hrs -BMP, CBC in the morning - Hold home aspirin  Prolonged Qtc- 490. Med list include venlafaxine. -As needed Phenergan  Recent hip surgery 03/2020 with persistent right hip pain. -Obtain portable pelvic x-ray  Hypertension-blood pressure elevated up to 180s. -Resume diltiazem,  Hydralazine,  alfuzosin -Hold lisinopril with contrast exposure  Prostate cancer-Per nursing home notes follows with urology. -Resume alfuzosin  DVT prophylaxis: Scds-with reported blood in stools Code Status: Full code. Family Communication: None at bedside Disposition Plan:  > 2 days, pending resolution of SIRS, treatment of infectious colitis. Consults called: None Admission status: Inpt, tele   I certify that at the point of admission it is my clinical judgment that the patient will require inpatient hospital care spanning beyond 2 midnights from the point of admission due to high intensity of service, high risk for further deterioration and high frequency of surveillance required. The following factors support the patient status of inpatient: Current intravenous antibiotics for intra-abdominal infection.   Bethena Roys MD [facility]  05/22/2020, 10:43 PM

## 2020-05-22 NOTE — ED Notes (Signed)
Patient transported to CT via stretcher by radiology staff.

## 2020-05-22 NOTE — Progress Notes (Signed)
Following for Code Sepsis  

## 2020-05-23 ENCOUNTER — Encounter: Payer: Self-pay | Admitting: Adult Health

## 2020-05-23 ENCOUNTER — Other Ambulatory Visit: Payer: Self-pay

## 2020-05-23 DIAGNOSIS — C61 Malignant neoplasm of prostate: Secondary | ICD-10-CM

## 2020-05-23 DIAGNOSIS — F0390 Unspecified dementia without behavioral disturbance: Secondary | ICD-10-CM

## 2020-05-23 DIAGNOSIS — Z66 Do not resuscitate: Secondary | ICD-10-CM

## 2020-05-23 LAB — BASIC METABOLIC PANEL
Anion gap: 4 — ABNORMAL LOW (ref 5–15)
BUN: 31 mg/dL — ABNORMAL HIGH (ref 8–23)
CO2: 27 mmol/L (ref 22–32)
Calcium: 9.2 mg/dL (ref 8.9–10.3)
Chloride: 106 mmol/L (ref 98–111)
Creatinine, Ser: 1.06 mg/dL (ref 0.61–1.24)
GFR, Estimated: >60 mL/min (ref >60)
Glucose, Bld: 141 mg/dL — ABNORMAL HIGH (ref 70–99)
Potassium: 3.7 mmol/L (ref 3.5–5.1)
Sodium: 137 mmol/L (ref 135–145)

## 2020-05-23 LAB — TYPE AND SCREEN
ABO/RH(D): O POS
Antibody Screen: POSITIVE
Unit division: 0
Unit division: 0

## 2020-05-23 LAB — BPAM RBC
Blood Product Expiration Date: 202201152359
Blood Product Expiration Date: 202201152359
Unit Type and Rh: 5100
Unit Type and Rh: 5100

## 2020-05-23 LAB — URINE CULTURE: Culture: <10000 — AB

## 2020-05-23 LAB — RESP PANEL BY RT-PCR (FLU A&B, COVID) ARPGX2
Influenza A by PCR: NEGATIVE
Influenza B by PCR: NEGATIVE
SARS Coronavirus 2 by RT PCR: NEGATIVE

## 2020-05-23 LAB — NO BLOOD PRODUCTS

## 2020-05-23 LAB — C DIFFICILE QUICK SCREEN W PCR REFLEX
C Diff antigen: NEGATIVE
C Diff interpretation: NOT DETECTED
C Diff toxin: NEGATIVE

## 2020-05-23 LAB — CBC
HCT: 38.4 % — ABNORMAL LOW (ref 39.0–52.0)
Hemoglobin: 12.2 g/dL — ABNORMAL LOW (ref 13.0–17.0)
MCH: 26.5 pg (ref 26.0–34.0)
MCHC: 31.8 g/dL (ref 30.0–36.0)
MCV: 83.3 fL (ref 80.0–100.0)
Platelets: 224 10*3/uL (ref 150–400)
RBC: 4.61 MIL/uL (ref 4.22–5.81)
RDW: 15.5 % (ref 11.5–15.5)
WBC: 29.7 10*3/uL — ABNORMAL HIGH (ref 4.0–10.5)
nRBC: 0 % (ref 0.0–0.2)

## 2020-05-23 LAB — MRSA PCR SCREENING: MRSA by PCR: NEGATIVE

## 2020-05-23 MED ORDER — ACETAMINOPHEN 650 MG RE SUPP: 650.0000 mg | Freq: Four times a day (QID) | RECTAL | Status: DC | PRN

## 2020-05-23 MED ORDER — METRONIDAZOLE IN NACL 5-0.79 MG/ML-% IV SOLN
500.0000 mg | Freq: Three times a day (TID) | INTRAVENOUS | Status: DC
  Administered 2020-05-23 – 2020-05-30 (×22): 500 mg via INTRAVENOUS
  Filled 2020-05-23 (×23): qty 100

## 2020-05-23 MED ORDER — MEMANTINE HCL 10 MG PO TABS
10.0000 mg | ORAL_TABLET | Freq: Two times a day (BID) | ORAL | Status: DC
  Administered 2020-05-23 – 2020-06-01 (×18): 10 mg via ORAL
  Filled 2020-05-23 (×18): qty 1

## 2020-05-23 MED ORDER — PROMETHAZINE HCL 12.5 MG PO TABS: 12.5000 mg | ORAL_TABLET | Freq: Four times a day (QID) | ORAL | Status: DC | PRN

## 2020-05-23 MED ORDER — ACETAMINOPHEN 325 MG PO TABS
650.0000 mg | ORAL_TABLET | Freq: Four times a day (QID) | ORAL | Status: DC | PRN
  Administered 2020-05-25 – 2020-05-31 (×6): 650 mg via ORAL
  Filled 2020-05-23 (×6): qty 2

## 2020-05-23 MED ORDER — SODIUM CHLORIDE 0.9 % IV SOLN
2.0000 g | INTRAVENOUS | Status: DC
  Administered 2020-05-23 – 2020-05-30 (×8): 2 g via INTRAVENOUS
  Filled 2020-05-23 (×8): qty 20

## 2020-05-23 MED ORDER — LACTATED RINGERS IV SOLN: INTRAVENOUS | Status: DC

## 2020-05-23 NOTE — Progress Notes (Addendum)
PROGRESS NOTE    Jeffrey Sherman  FWY:[id number] DOB: 1929/11/22 DOA: 05/22/2020 PCP: Celene Squibb, MD    Brief Narrative:  86 y/o male with a history of hypertension, dementia, prostate cancer, who is currently in a nursing home, presents to the hospital with abdominal pain and diarrhea.  Found to have significant leukocytosis, low blood pressure as well as abdominal CT consistent with colitis.  C. diff found to be negative.  He is on antibiotics and IV fluids.   Assessment & Plan:   Principal Problem:   Sepsis ([place]) Active Problems:   Essential hypertension   Infectious colitis   Prostate cancer ([place])   Major depression, recurrent, chronic ([place])   Dementia without behavioral disturbance ([place])   Restless leg syndrome   Sepsis secondary to colitis -Patient presented with fever, leukocytosis, tachycardia and tachypnea -Abdominal CT indicated distal transverse splenic flexure and sigmoid colon colitis -Stool for C. difficile found to be negative -Patient is currently on intravenous antibiotics -Continue bowel rest, currently on clear liquids, advance as tolerated -Continue IV hydration -Repeat labs in a.m.  Hypertension -Continued on diltiazem and hydralazine -Lisinopril currently on hold since he was exposed to contrast  Prostate cancer -Continue follow-up with urology -Continue on alfuzosin  Dementia -Currently, patient does not appear confused   DVT prophylaxis: SCDs Start: 05/23/20 0258  Code Status: full code Family Communication: discussed with patient Disposition Plan: Status is: Inpatient  Remains inpatient appropriate because:IV treatments appropriate due to intensity of illness or inability to take PO   Dispo: The patient is from: SNF              Anticipated d/c is to: SNF              Anticipated d/c date is: 2 days              Patient currently is not medically stable to d/c.   Consultants:     Procedures:     Antimicrobials:    Ceftriaxone 12/14 >  Flagyl 12/14 >   Subjective: Patient reports that he is feeling better today.  Continues to have some loose stools and abdominal pain.  No nausea or vomiting.  Objective: Vitals:   05/23/20 1200 05/23/20 1230 05/23/20 1300 05/23/20 1324  BP: 126/76 (!) 142/67 132/66 (!) 146/66  Pulse: (!) 103 (!) 107 (!) 107 (!) 104  Resp: 19 20 (!) 22 16  Temp:    97.9 F (36.6 C)  TempSrc:    Oral  SpO2: 95% 96% 95% 97%  Weight:      Height:        Intake/Output Summary (Last 24 hours) at 05/23/2020 2038 Last data filed at 05/23/2020 1700 Gross per 24 hour  Intake 1480 ml  Output --  Net 1480 ml   Filed Weights   05/22/20 1810  Weight: 88 kg    Examination:  General exam: Appears calm and comfortable  Respiratory system: Clear to auscultation. Respiratory effort normal. Cardiovascular system: S1 & S2 heard, RRR. No JVD, murmurs, rubs, gallops or clicks. Gastrointestinal system: Abdomen is nondistended, soft and diffusely tender. No organomegaly or masses felt. Normal bowel sounds heard. Central nervous system: Alert and oriented. No focal neurological deficits. Extremities: Symmetric 5 x 5 power. Skin: No rashes, lesions or ulcers Psychiatry: Judgement and insight appear normal. Mood & affect appropriate.     Data Reviewed: I have personally reviewed following labs and imaging studies  CBC: Recent Labs  Lab 05/22/20 0130 05/22/20  1805 05/23/20 0600  WBC 19.4* 29.7* 29.7*  NEUTROABS 17.2* 25.1*  --   HGB 14.6 14.1 12.2*  HCT 43.8 43.4 38.4*  MCV 81.6 82.4 83.3  PLT 254 265 893   Basic Metabolic Panel: Recent Labs  Lab 05/22/20 0130 05/22/20 1805 05/23/20 0600  NA 138 137 137  K 3.0* 3.4* 3.7  CL 104 101 106  CO2 23 28 27   GLUCOSE 211* 159* 141*  BUN 35* 33* 31*  CREATININE 1.20 1.19 1.06  CALCIUM 9.8 9.6 9.2  MG  --  1.9  --    GFR: Estimated Creatinine Clearance: 52.9 mL/min (by C-G formula based on SCr of 1.06 mg/dL). Liver  Function Tests: Recent Labs  Lab 05/22/20 1805  AST 15  ALT 12  ALKPHOS 66  BILITOT 0.7  PROT 6.2*  ALBUMIN 3.5   Recent Labs  Lab 05/22/20 1225  LIPASE 18  AMYLASE 58   No results for input(s): AMMONIA in the last 168 hours. Coagulation Profile: Recent Labs  Lab 05/22/20 1805  INR 1.2   Cardiac Enzymes: No results for input(s): CKTOTAL, CKMB, CKMBINDEX, TROPONINI in the last 168 hours. BNP (last 3 results) No results for input(s): PROBNP in the last 8760 hours. HbA1C: No results for input(s): HGBA1C in the last 72 hours. CBG: No results for input(s): GLUCAP in the last 168 hours. Lipid Profile: No results for input(s): CHOL, HDL, LDLCALC, TRIG, CHOLHDL, LDLDIRECT in the last 72 hours. Thyroid Function Tests: No results for input(s): TSH, T4TOTAL, FREET4, T3FREE, THYROIDAB in the last 72 hours. Anemia Panel: No results for input(s): VITAMINB12, FOLATE, FERRITIN, TIBC, IRON, RETICCTPCT in the last 72 hours. Sepsis Labs: Recent Labs  Lab 05/22/20 1805 05/22/20 2230  LATICACIDVEN 2.1* 1.9    Recent Results (from the past 240 hour(s))  Culture, blood (routine x 2)     Status: None (Preliminary result)   Collection Time: 05/22/20  7:45 AM   Specimen: BLOOD  Result Value Ref Range Status   Specimen Description BLOOD RIGHT ANTECUBITAL DRAWN BY RN  Final   Special Requests   Final    BOTTLES DRAWN AEROBIC AND ANAEROBIC Blood Culture results may not be optimal due to an excessive volume of blood received in culture bottles   Culture   Final    NO GROWTH < 12 HOURS Performed at [facility], [address], [place], [place] [address]    Report Status PENDING  Incomplete  Culture, blood (routine x 2)     Status: None (Preliminary result)   Collection Time: 05/22/20  7:50 AM   Specimen: BLOOD  Result Value Ref Range Status   Specimen Description BLOOD LEFT ANTECUBITAL DRAWN BY RN  Final   Special Requests   Final    BOTTLES DRAWN AEROBIC AND ANAEROBIC Blood Culture  results may not be optimal due to an excessive volume of blood received in culture bottles   Culture   Final    NO GROWTH < 12 HOURS Performed at [facility], [address] [place], [place] [address]    Report Status PENDING  Incomplete  C Difficile Quick Screen w PCR reflex     Status: None   Collection Time: 05/22/20 11:55 PM   Specimen: STOOL  Result Value Ref Range Status   C Diff antigen NEGATIVE NEGATIVE Final   C Diff toxin NEGATIVE NEGATIVE Final   C Diff interpretation No C. difficile detected.  Final    Comment: Performed at [facility], [address]  [place], [place] [address]  Resp Panel by RT-PCR (Flu A&B, Covid) Nasopharyngeal Swab     Status: None   Collection Time: 05/23/20  1:00 AM   Specimen: Nasopharyngeal Swab; Nasopharyngeal(NP) swabs in vial transport medium  Result Value Ref Range Status   SARS Coronavirus 2 by RT PCR NEGATIVE NEGATIVE Final    Comment: (NOTE) SARS-CoV-2 target nucleic acids are NOT DETECTED.  The SARS-CoV-2 RNA is generally detectable in upper respiratory specimens during the acute phase of infection. The lowest concentration of SARS-CoV-2 viral copies this assay can detect is 138 copies/mL. A negative result does not preclude SARS-Cov-2 infection and should not be used as the sole basis for treatment or other patient management decisions. A negative result may occur with  improper specimen collection/handling, submission of specimen other than nasopharyngeal swab, presence of viral mutation(s) within the areas targeted by this assay, and inadequate number of viral copies(<138 copies/mL). A negative result must be combined with clinical observations, patient history, and epidemiological information. The expected result is Negative.  Fact Sheet for Patients:  EntrepreneurPulse.com.au  Fact Sheet for Healthcare Providers:  IncredibleEmployment.be  This test is no t yet approved or cleared by the  [place] FDA and  has been authorized for detection and/or diagnosis of SARS-CoV-2 by FDA under an Emergency Use Authorization (EUA). This EUA will remain  in effect (meaning this test can be used) for the duration of the COVID-19 declaration under Section 564(b)(1) of the Act, 21 U.S.C.section 360bbb-3(b)(1), unless the authorization is terminated  or revoked sooner.       Influenza A by PCR NEGATIVE NEGATIVE Final   Influenza B by PCR NEGATIVE NEGATIVE Final    Comment: (NOTE) The Xpert Xpress SARS-CoV-2/FLU/RSV plus assay is intended as an aid in the diagnosis of influenza from Nasopharyngeal swab specimens and should not be used as a sole basis for treatment. Nasal washings and aspirates are unacceptable for Xpert Xpress SARS-CoV-2/FLU/RSV testing.  Fact Sheet for Patients: EntrepreneurPulse.com.au  Fact Sheet for Healthcare Providers: IncredibleEmployment.be  This test is not yet approved or cleared by the [place] FDA and has been authorized for detection and/or diagnosis of SARS-CoV-2 by FDA under an Emergency Use Authorization (EUA). This EUA will remain in effect (meaning this test can be used) for the duration of the COVID-19 declaration under Section 564(b)(1) of the Act, 21 U.S.C. section 360bbb-3(b)(1), unless the authorization is terminated or revoked.  Performed at [facility], [address], [place], [place] [address]   MRSA PCR Screening     Status: None   Collection Time: 05/23/20  2:53 PM   Specimen: Nasal Mucosa; Nasopharyngeal  Result Value Ref Range Status   MRSA by PCR NEGATIVE NEGATIVE Final    Comment:        The GeneXpert MRSA Assay (FDA approved for NASAL specimens only), is one component of a comprehensive MRSA colonization surveillance program. It is not intended to diagnose MRSA infection nor to guide or monitor treatment for MRSA infections. Performed at [facility], [address]  [place], [place] [address]          Radiology Studies: CT ABDOMEN PELVIS W CONTRAST  Result Date: 05/22/2020 CLINICAL DATA:  Abdominal pain, fever, rectal bleeding EXAM: CT ABDOMEN AND PELVIS WITH CONTRAST TECHNIQUE: Multidetector CT imaging of the abdomen and pelvis was performed using the standard protocol following bolus administration of intravenous contrast. CONTRAST:  147mL OMNIPAQUE IOHEXOL 300 MG/ML  SOLN COMPARISON:  May 2019 FINDINGS: Lower chest: Bibasilar atelectasis/scarring. Hepatobiliary:  Small cyst of the left hepatic lobe. Few small layering gallstones. No biliary dilatation. Pancreas: Unremarkable. Spleen: Unremarkable. Adrenals/Urinary Tract: Adrenal glands are unremarkable. Bilateral renal cysts. 3 mm nonobstructing calculus of the lower pole the right kidney. Small bladder diverticula. Stomach/Bowel: Stomach is within normal limits. Distal colonic diverticulosis. There is wall thickening along the distal transverse colon, splenic flexure, and descending colon. Vascular/Lymphatic: Aortic atherosclerosis. No enlarged lymph nodes. Reproductive: Unremarkable. Other: Small volume abdominopelvic ascites. No acute abnormality of the abdominal wall. Musculoskeletal: Right femoral fixation with associated streak artifact. Degenerative changes of the lumbar spine. No acute osseous abnormality. IMPRESSION: Findings consistent with colitis involving the distal transverse colon, splenic flexure, and descending colon. This is likely ischemic or infectious in etiology. Diverticulosis is present but mostly distal to the area of thickening and diverticulitis is therefore less likely. 3 mm nonobstructing left renal calculus. Small bladder diverticula. Electronically Signed   By: Macy Mis M.D.   On: 05/22/2020 20:12   DG Pelvis Portable  Result Date: 05/22/2020 CLINICAL DATA:  Fall, pelvic injury EXAM: PORTABLE PELVIS 1-2 VIEWS COMPARISON:  None. FINDINGS: ORIF of a right subcapital femoral neck  fracture has been performed utilizing 4 partially-threaded screws with incomplete healing of the a fracture. No acute fracture or dislocation. Mild bilateral hip joint space narrowing is present in keeping with at least mild bilateral degenerative arthritis. Contrast is seen within the bladder lumen. Small bladder diverticulum present as well as trabeculation of the bladder wall is present in keeping with changes of probable chronic bladder outlet obstruction. Defect involving the bladder base likely represents mass effect from a hypertrophied central prostatic lobe. IMPRESSION: Status post right hip ORIF.  No acute fracture or dislocation. Findings in keeping with changes of chronic bladder outlet obstruction, possibly related to central prostatic hypertrophy. Electronically Signed   By: Fidela Salisbury MD   On: 05/22/2020 23:07   DG Chest Port 1 View  Result Date: 05/22/2020 CLINICAL DATA:  Questionable sepsis - evaluate for abnormality Patient reports dizziness leading to fall and abdominal pain. EXAM: PORTABLE CHEST 1 VIEW COMPARISON:  Radiograph 03/12/2020 FINDINGS: Improved cardiomegaly from prior. Unchanged mediastinal contours with aortic atherosclerosis and tortuosity. Chronic interstitial coarsening. Streaky atelectasis at the right lung base. No confluent consolidation. No pulmonary edema or large pleural effusion. No pneumothorax. No acute osseous abnormalities are seen. IMPRESSION: 1. Mild right basilar atelectasis. 2. Improved cardiomegaly from prior. Stable aortic atherosclerosis and tortuosity. Electronically Signed   By: Keith Rake M.D.   On: 05/22/2020 18:50        Scheduled Meds: . alfuzosin  10 mg Oral Q breakfast  . diltiazem  360 mg Oral Daily  . hydrALAZINE  100 mg Oral TID  . venlafaxine XR  75 mg Oral Q breakfast   Continuous Infusions: . cefTRIAXone (ROCEPHIN)  IV Stopped (05/23/20 2751)  . metronidazole 500 mg (05/23/20 1817)     LOS: 1 day    Time spent:  22mins    Kathie Dike, MD [facility]   If 7PM-7AM, please contact night-coverage [email or web address]  05/23/2020, 8:38 PM

## 2020-05-23 NOTE — Plan of Care (Signed)
  Problem: Education: Goal: Knowledge of General Education information will improve Description Including pain rating scale, medication(s)/side effects and non-pharmacologic comfort measures Outcome: Progressing   

## 2020-05-23 NOTE — ED Notes (Signed)

## 2020-05-23 NOTE — ED Notes (Signed)
Message sent to pharmacy regarding missing medications.

## 2020-05-23 NOTE — ED Notes (Signed)
Pt had small liquid burgundy stool.

## 2020-05-23 NOTE — Progress Notes (Signed)
Location:  [place] Room Number: 156-D Place of Service:  SNF (31)   CODE STATUS: DNR  No Known Allergies  Chief Complaint  Patient presents with  . Acute Visit    Medication review     HPI:    Past Medical History:  Diagnosis Date  . Anxiety   . Cancer [facility])    prostate  . Chronic chest wall pain   . Essential hypertension, benign 06/20/2016  . GAD (generalized anxiety disorder)   . GERD (gastroesophageal reflux disease)   . H/O echocardiogram 2016   normal  . Heart murmur    "leaking heart valve"  . High cholesterol 06/20/2016  . HOH (hard of hearing)    left  . Hx of falling   . Hyperlipidemia   . Interstitial pulmonary disease ([place])   . MDD (major depressive disorder)   . Murmur, cardiac   . Poor historian   . PUD (peptic ulcer disease)   . PUD (peptic ulcer disease)   . RLS (restless legs syndrome)   . Shortness of breath   . Wrist fracture 12/16/2011    Past Surgical History:  Procedure Laterality Date  . ESOPHAGOGASTRODUODENOSCOPY    . ESOPHAGOGASTRODUODENOSCOPY N/A 07/18/2016   Procedure: ESOPHAGOGASTRODUODENOSCOPY (EGD);  Surgeon: Rogene Houston, MD;  Location: [facility] ENDO SUITE;  Service: Endoscopy;  Laterality: N/A;  3:00-moved to 230 per Lelon Frohlich  . HIP PINNING,CANNULATED Right 03/10/2020   Procedure: INTERNAL FIXATION RIGHT HIP;  Surgeon: Carole Civil, MD;  Location: [facility] ORS;  Service: Orthopedics;  Laterality: Right;  . ORIF WRIST FRACTURE  12/19/2011   Procedure: OPEN REDUCTION INTERNAL FIXATION (ORIF) WRIST FRACTURE;  Surgeon: Carole Civil, MD;  Location: [facility] ORS;  Service: Orthopedics;  Laterality: Right;  . prostate cancer     Diagnosed in the 90s  . TONSILLECTOMY      Social History   Socioeconomic History  . Marital status: Widowed    Spouse name: Not on file  . Number of children: Not on file  . Years of education: Not on file  . Highest education level: Not on file  Occupational History  . Occupation:  retired    Fish farm manager: RETIRED  Tobacco Use  . Smoking status: Never Smoker  . Smokeless tobacco: Never Used  Vaping Use  . Vaping Use: Never used  Substance and Sexual Activity  . Alcohol use: No  . Drug use: No  . Sexual activity: Not on file  Other Topics Concern  . Not on file  Social History Narrative  . Not on file   Social Determinants of Health   Financial Resource Strain: Not on file  Food Insecurity: Not on file  Transportation Needs: Not on file  Physical Activity: Not on file  Stress: Not on file  Social Connections: Not on file  Intimate Partner Violence: Not on file   Family History  Problem Relation Age of Onset  . Cancer Other   . Heart attack Mother   . Cancer Brother   . Heart attack Brother   . Heart disease Sister        by pass      VITAL SIGNS BP (!) 152/78   Pulse (!) 58   Temp 97.9 F (36.6 C)   Resp 20   Ht 5\' 9"  (1.753 m)   Wt 194 lb 6.4 oz (88.2 kg)   SpO2 97%   BMI 28.71 kg/m   Facility-Administered Encounter Medications as of 05/23/2020  Medication  .  0.9 % NaCl with KCl 40 mEq / L  infusion  . acetaminophen (TYLENOL) tablet 650 mg   Or  . acetaminophen (TYLENOL) suppository 650 mg  . alfuzosin (UROXATRAL) 24 hr tablet 10 mg  . cefTRIAXone (ROCEPHIN) 2 g in sodium chloride 0.9 % 100 mL IVPB  . diltiazem (CARDIZEM CD) 24 hr capsule 360 mg  . hydrALAZINE (APRESOLINE) tablet 100 mg  . metroNIDAZOLE (FLAGYL) IVPB 500 mg  . morphine 2 MG/ML injection 2 mg  . promethazine (PHENERGAN) tablet 12.5 mg  . venlafaxine XR (EFFEXOR-XR) 24 hr capsule 75 mg   Outpatient Encounter Medications as of 05/23/2020  Medication Sig Note  . acetaminophen (TYLENOL) 500 MG tablet Take 1,000 mg by mouth in the morning, at noon, and at bedtime.   Marland Kitchen alfuzosin (UROXATRAL) 10 MG 24 hr tablet Take 10 mg by mouth daily with breakfast.   . Amino Acids-Protein Hydrolys (PRO-STAT PO) liquid; 15-100 gram-kcal/30 mL; amt: 30 ml; oral Special Instructions:  for albumin 2.5 With Meals   . Ascorbic Acid (VITAMIN C) 1000 MG tablet Take 1,000 mg by mouth daily.   Marland Kitchen aspirin 81 MG chewable tablet Chew 81 mg by mouth daily.   Roseanne Kaufman [place]-Castor Oil [facility]) OINT Special Instructions: Apply to bilateral buttocks & sacral area qshift for blanchable erythema.   . calcium carbonate (TUMS - DOSED IN MG ELEMENTAL CALCIUM) 500 MG chewable tablet Chew 1 tablet by mouth 2 (two) times daily as needed for indigestion or heartburn.    . cloNIDine (CATAPRES) 0.1 MG tablet Take 0.1 mg by mouth See admin instructions. Stat-immediately due to elavated blood pressure   . diltiazem (CARDIZEM CD) 360 MG 24 hr capsule Take 360 mg by mouth daily. Start 03/23/2020   . ferrous sulfate 325 (65 FE) MG tablet Take 325 mg by mouth daily with breakfast. For Anemia and  Iron Deficiency   . Flax Oil-Fish Oil-Borage Oil (FISH OIL-FLAX OIL-BORAGE OIL) CAPS Take 1 capsule by mouth daily.   . hydrALAZINE (APRESOLINE) 100 MG tablet Take 100 mg by mouth 3 (three) times daily.   Marland Kitchen lisinopril (ZESTRIL) 40 MG tablet Take 1 tablet (40 mg total) by mouth daily.   . Melatonin 10 MG TABS Take 10 mg by mouth at bedtime.   . memantine (NAMENDA) 10 MG tablet Take 10 mg by mouth 2 (two) times daily.   . NON FORMULARY Diet - Mechanical Soft 05/22/2020: Not on Mar   . omeprazole (PRILOSEC) 20 MG capsule Take 1 capsule (20 mg total) by mouth daily before breakfast.   . OXYGEN Inhale 2 L into the lungs continuous. 05/22/2020: Not on Mar  . polyethylene glycol (MIRALAX / GLYCOLAX) 17 g packet Take 17 g by mouth daily.   . potassium chloride SA (KLOR-CON) 20 MEQ tablet Take 20 mEq by mouth 2 (two) times daily.    Marland Kitchen rOPINIRole (REQUIP) 0.25 MG tablet Take 0.25 mg by mouth at bedtime.   . salicylic acid 17 % gel Apply 1 application topically in the morning and at bedtime. Apply to warts to left hand   . senna-docusate (SENOKOT-S) 8.6-50 MG tablet Take 1 tablet by mouth at bedtime as needed for mild  constipation.   Marland Kitchen terazosin (HYTRIN) 5 MG capsule Take 5 mg by mouth at bedtime.    Marland Kitchen venlafaxine XR (EFFEXOR-XR) 150 MG 24 hr capsule Take 75 mg by mouth daily with breakfast.      SIGNIFICANT DIAGNOSTIC EXAMS       ASSESSMENT/ PLAN:  MD is aware of resident's narcotic use and is in agreement with current plan of care. We will attempt to wean resident as appropriate.  Ok Edwards NP [facility]  Contact [phone] Monday through Friday 8am- 5pm  After hours call [phone]   This encounter was created in error - please disregard.

## 2020-05-23 NOTE — ED Notes (Addendum)
Pt placed on bedpan. Pt had small foul smelling burgundy liquid stool. Specimen sent to lab.

## 2020-05-24 DIAGNOSIS — K559 Vascular disorder of intestine, unspecified: Secondary | ICD-10-CM

## 2020-05-24 LAB — BASIC METABOLIC PANEL
Anion gap: 6 (ref 5–15)
BUN: 32 mg/dL — ABNORMAL HIGH (ref 8–23)
CO2: 24 mmol/L (ref 22–32)
Calcium: 9.2 mg/dL (ref 8.9–10.3)
Chloride: 108 mmol/L (ref 98–111)
Creatinine, Ser: 1.04 mg/dL (ref 0.61–1.24)
GFR, Estimated: >60 mL/min (ref >60)
Glucose, Bld: 118 mg/dL — ABNORMAL HIGH (ref 70–99)
Potassium: 3.9 mmol/L (ref 3.5–5.1)
Sodium: 138 mmol/L (ref 135–145)

## 2020-05-24 LAB — CBC
HCT: 34.6 % — ABNORMAL LOW (ref 39.0–52.0)
Hemoglobin: 10.8 g/dL — ABNORMAL LOW (ref 13.0–17.0)
MCH: 26.7 pg (ref 26.0–34.0)
MCHC: 31.2 g/dL (ref 30.0–36.0)
MCV: 85.4 fL (ref 80.0–100.0)
Platelets: 198 10*3/uL (ref 150–400)
RBC: 4.05 MIL/uL — ABNORMAL LOW (ref 4.22–5.81)
RDW: 15.5 % (ref 11.5–15.5)
WBC: 22.6 10*3/uL — ABNORMAL HIGH (ref 4.0–10.5)
nRBC: 0 % (ref 0.0–0.2)

## 2020-05-24 MED ORDER — MELATONIN 3 MG PO TABS
9.0000 mg | ORAL_TABLET | Freq: Every evening | ORAL | Status: DC | PRN
  Administered 2020-05-25 – 2020-05-30 (×4): 9 mg via ORAL
  Filled 2020-05-24 (×5): qty 3

## 2020-05-24 MED ORDER — MELATONIN 5 MG PO TABS
10.0000 mg | ORAL_TABLET | Freq: Every evening | ORAL | Status: DC | PRN
  Filled 2020-05-24: qty 2

## 2020-05-24 NOTE — [place] FL2 (Signed)
[place] MEDICAID FL2 LEVEL OF CARE SCREENING TOOL     IDENTIFICATION  Patient Name: Jeffrey Sherman Birthdate: Nov 07, 1929 Sex: male Admission Date (Current Location): 05/22/2020  [facility] and [place] Number:  [employer] and Address:  [facility] [address] [place]      Provider Number: [id number]  Attending Physician Name and Address:  Orson Eva, MD  Relative Name and Phone Number:       Current Level of Care: Hospital Recommended Level of Care:   Prior Approval Number:    Date Approved/Denied:   PASRR Number:    Discharge Plan: SNF    Current Diagnoses: Patient Active Problem List   Diagnosis Date Noted  . Sepsis ([place]) 05/22/2020  . Restless leg syndrome 04/21/2020  . Protein-calorie malnutrition, severe ([place]) 03/22/2020  . Dementia without behavioral disturbance ([place]) 03/22/2020  . Iron deficiency anemia due to dietary causes 03/22/2020  . Prostate cancer ([place]) 03/15/2020  . Overactive bladder 03/15/2020  . Dyslipidemia 03/15/2020  . Major depression, recurrent, chronic ([place]) 03/15/2020  . Interstitial lung disease ([place]) 03/15/2020  . Closed displaced fracture of right femoral neck ([place]) 03/10/20 cannulated screw fixation  03/09/2020  . Infectious colitis   . Acute blood loss as cause of postoperative anemia   . Rectal bleeding 11/06/2017  . Hypokalemia 11/06/2017  . Renal insufficiency 11/06/2017  . Essential hypertension 06/20/2016  . High cholesterol 06/20/2016  . Gastroesophageal reflux disease without esophagitis 06/20/2016  . Dyspnea 12/17/2013  . Chronic constipation 12/16/2011    Orientation RESPIRATION BLADDER Height & Weight     Self,Situation,Place  O2 (see dc summary) Incontinent Weight: 194 lb (88 kg) Height:  5\' 9"  (175.3 cm)  BEHAVIORAL SYMPTOMS/MOOD NEUROLOGICAL BOWEL NUTRITION STATUS      Continent Diet (see dc summary)  AMBULATORY STATUS COMMUNICATION OF NEEDS Skin   Extensive Assist  Verbally                         Personal Care Assistance Level of Assistance    Bathing Assistance: Maximum assistance Feeding assistance: Limited assistance Dressing Assistance: Maximum assistance     Functional Limitations Info    Sight Info: Adequate Hearing Info: Impaired Speech Info: Adequate    SPECIAL CARE FACTORS FREQUENCY                       Contractures Contractures Info: Not present    Additional Factors Info    Code Status Info: Full Allergies Info: NKDA           Current Medications (05/24/2020):  This is the current hospital active medication list Current Facility-Administered Medications  Medication Dose Route Frequency Provider Last Rate Last Admin  . acetaminophen (TYLENOL) tablet 650 mg  650 mg Oral Q6H PRN Emokpae, Ejiroghene E, MD       Or  . acetaminophen (TYLENOL) suppository 650 mg  650 mg Rectal Q6H PRN Emokpae, Ejiroghene E, MD      . alfuzosin (UROXATRAL) 24 hr tablet 10 mg  10 mg Oral Q breakfast Emokpae, Ejiroghene E, MD   10 mg at 05/24/20 1035  . cefTRIAXone (ROCEPHIN) 2 g in sodium chloride 0.9 % 100 mL IVPB  2 g Intravenous Q24H Emokpae, Ejiroghene E, MD 200 mL/hr at 05/24/20 0601 2 g at 05/24/20 0601  . diltiazem (CARDIZEM CD) 24 hr capsule 360 mg  360 mg Oral Daily Emokpae, Ejiroghene E, MD   360  mg at 05/24/20 1035  . hydrALAZINE (APRESOLINE) tablet 100 mg  100 mg Oral TID Emokpae, Ejiroghene E, MD   100 mg at 05/24/20 1035  . lactated ringers infusion   Intravenous Continuous Kathie Dike, MD 100 mL/hr at 05/23/20 2204 New Bag at 05/23/20 2204  . memantine (NAMENDA) tablet 10 mg  10 mg Oral BID Kathie Dike, MD   10 mg at 05/24/20 1035  . metroNIDAZOLE (FLAGYL) IVPB 500 mg  500 mg Intravenous Q8H Emokpae, Ejiroghene E, MD 100 mL/hr at 05/24/20 1046 500 mg at 05/24/20 1046  . morphine 2 MG/ML injection 2 mg  2 mg Intravenous Q4H PRN Emokpae, Ejiroghene E, MD   2 mg at 05/24/20 0331  . promethazine (PHENERGAN) tablet  12.5 mg  12.5 mg Oral Q6H PRN Emokpae, Ejiroghene E, MD      . venlafaxine XR (EFFEXOR-XR) 24 hr capsule 75 mg  75 mg Oral Q breakfast Emokpae, Ejiroghene E, MD   75 mg at 05/24/20 1036     Discharge Medications: Please see discharge summary for a list of discharge medications.  Relevant Imaging Results:  Relevant Lab Results:   Additional Information    Shade Flood, LCSW

## 2020-05-24 NOTE — TOC Initial Note (Signed)
Transition of Care [facility]) - Initial/Assessment Note    Patient Details  Name: Jeffrey Sherman MRN: [id number] Date of Birth: Apr 04, 1930  Transition of [facility]) CM/SW Contact:    Shade Flood, LCSW Phone Number: 05/24/2020, 11:05 AM  Clinical Narrative:                  Pt admitted from long term care at [facility]. Plan is for return to [facility] at dc. Pt status reviewed with MD in [place] today and MD is anticipating pt will be stable for dc tomorrow. Updated Kerri at [facility] and they would like a new COVID test for dc. Updated MD. Will follow up in AM.  Expected Discharge Plan: Long Term Nursing Home Barriers to Discharge: Continued Medical Work up   Patient Goals and CMS Choice        Expected Discharge Plan and Services Expected Discharge Plan: [place] In-house Referral: Clinical Social Work   Post Acute Care Choice: Resumption of Svcs/PTA Provider Living arrangements for the past 2 months: [place]                                      Prior Living Arrangements/Services Living arrangements for the past 2 months: [place] Lives with:: Facility Resident Patient language and need for interpreter reviewed:: Yes Do you feel safe going back to the place where you live?: Yes      Need for Family Participation in Patient Care: No (Comment) Care giver support system in place?: Yes (comment)   Criminal Activity/Legal Involvement Pertinent to Current Situation/Hospitalization: No - Comment as needed  Activities of Daily Living Home Assistive Devices/Equipment: Wheelchair ADL Screening (condition at time of admission) Patient's cognitive ability adequate to safely complete daily activities?: Yes Is the patient deaf or have difficulty hearing?: Yes Does the patient have difficulty seeing, even when wearing glasses/contacts?: No Does the patient have difficulty concentrating, remembering, or making decisions?: No Patient able to  express need for assistance with ADLs?: Yes Does the patient have difficulty dressing or bathing?: Yes Independently performs ADLs?: Yes (appropriate for developmental age) Communication: Independent Dressing (OT): Needs assistance Is this a change from baseline?: Pre-admission baseline Grooming: Needs assistance Is this a change from baseline?: Pre-admission baseline Feeding: Independent Bathing: Needs assistance Is this a change from baseline?: Pre-admission baseline Toileting: Needs assistance Is this a change from baseline?: Pre-admission baseline In/Out Bed: Needs assistance Is this a change from baseline?: Pre-admission baseline Walks in Home: Needs assistance Is this a change from baseline?: Pre-admission baseline Does the patient have difficulty walking or climbing stairs?: Yes Weakness of Legs: Both Weakness of Arms/Hands: None  Permission Sought/Granted                  Emotional Assessment       Orientation: : Oriented to Self,Oriented to Place,Oriented to Situation Alcohol / Substance Use: Not Applicable Psych Involvement: No (comment)  Admission diagnosis:  Infectious colitis [A09] Colitis [K52.9] Right hip pain [M25.551] Patient Active Problem List   Diagnosis Date Noted  . Sepsis ([place]) 05/22/2020  . Restless leg syndrome 04/21/2020  . Protein-calorie malnutrition, severe ([place]) 03/22/2020  . Dementia without behavioral disturbance ([place]) 03/22/2020  . Iron deficiency anemia due to dietary causes 03/22/2020  . Prostate cancer ([place]) 03/15/2020  . Overactive bladder 03/15/2020  . Dyslipidemia 03/15/2020  . Major depression, recurrent, chronic ([place]) 03/15/2020  .  Interstitial lung disease ([place]) 03/15/2020  . Closed displaced fracture of right femoral neck ([place]) 03/10/20 cannulated screw fixation  03/09/2020  . Infectious colitis   . Acute blood loss as cause of postoperative anemia   . Rectal bleeding 11/06/2017  . Hypokalemia 11/06/2017  . Renal  insufficiency 11/06/2017  . Essential hypertension 06/20/2016  . High cholesterol 06/20/2016  . Gastroesophageal reflux disease without esophagitis 06/20/2016  . Dyspnea 12/17/2013  . Chronic constipation 12/16/2011   PCP:  Celene Squibb, MD Pharmacy:   [facility], [place] [address] Phone: [phone] Fax: [phone]  [facility] - [address], [place] [address] Phone: [phone] Fax: [phone]  [facility] [place], [place] [address] [place] [address] Phone: [phone] Fax: [phone]     Social Determinants of Health (SDOH) Interventions    Readmission Risk Interventions Readmission Risk Prevention Plan 05/24/2020  Transportation Screening Complete  Medication Review Press photographer) Complete  Skilled Nursing Facility Complete  Some recent data might be hidden

## 2020-05-24 NOTE — Progress Notes (Addendum)
PROGRESS NOTE  Jeffrey Sherman ZHG:[id number] DOB: 02/09/1930 DOA: 05/22/2020 PCP: Celene Squibb, MD  Brief History:  86 y.o. male with medical history significant for prostate cancer, dementia, hypertension, peptic ulcer disease. Brought to the ED from [facility] with reports of abdominal pain, diarrhea.  Patient's symptoms started 05/21/20, with nausea vomiting and diarrhea.  There was some bright red blood in his stool. He also reports vomiting.  Patient reports abdominal pain was very severe, and all over his abdomen particularly left side of abdomen.  ED Course:, Tachycardic to 116, intermittent tachypnea to 24, blood pressure systolic 196Q to 229N, O2 sats greater than 91% on room air.  Potassium 3.4.  WBC 29.  Abd CT-consistent with colitis involving the distal transverse, splenic flexure and descending colon-infectious versus h ischemic etiology.  Portable chest x-ray unremarkable.  IV cefepime and metronidazole.  Hospitalist to admit for likely infectious colitis.  Assessment/Plan:   Sepsis secondary to colitis -Patient presented with fever, leukocytosis, tachycardia and tachypnea -Abdominal CT indicated distal transverse splenic flexure and sigmoid colon colitis -Stool for C. difficile found to be negative -Patient is currently on intravenous antibiotics -Continue bowel rest, currently on clear liquids, advance as tolerated -Continue IV hydration -Repeat labs in a.m. -suspect ischemic colitis -advance to full liquid  Hematochezia -due to ischemic colitis -no hematochezia since admission -drop in Hb due to dilution  Hypertension -Continued on diltiazem and hydralazine -Lisinopril currently on hold since he was exposed to contrast -BP remains controlled  Prostate cancer -Continue follow-up with urology -Continue on alfuzosin  Dementia without behavioral disturbance -continue Namenda       Status is: Inpatient  Remains inpatient appropriate  because:IV treatments appropriate due to intensity of illness or inability to take PO   Dispo: The patient is from: SNF              Anticipated d/c is to: SNF              Anticipated d/c date is: 1 day              Patient currently is not medically stable to d/c.        Family Communication:   No Family at bedside  Consultants:  none  Code Status: DNR  DVT Prophylaxis:  SCDs   Procedures: As Listed in Progress Note Above  Antibiotics: Ceftriaxone 12/14>> Cefepime 12/13>>12/14 metronidazole 12/13>>     Subjective: Patient feels that abd pain is slowly improving.  N/v/d has improved.  No BM today.  Denies cp, sob, f/c  Objective: Vitals:   05/24/20 0010 05/24/20 0328 05/24/20 1614 05/24/20 1700  BP: (!) 126/59 139/70 129/61   Pulse: 82 86 64   Resp: 19 18 19    Temp: 98.3 F (36.8 C) 98 F (36.7 C) (!) 97.4 F (36.3 C)   TempSrc:   Oral   SpO2: 98% 97% 94% 97%  Weight:      Height:        Intake/Output Summary (Last 24 hours) at 05/24/2020 1801 Last data filed at 05/24/2020 1500 Gross per 24 hour  Intake 1377.11 ml  Output 800 ml  Net 577.11 ml   Weight change:  Exam:   General:  Pt is alert, follows commands appropriately, not in acute distress  HEENT: No icterus, No thrush, No neck mass, [place]/AT  Cardiovascular: RRR, S1/S2, no rubs, no gallops  Respiratory: fine bibasilar crackles. No wheeze  Abdomen: Soft/+BS,  LLQ tender, non distended, no guarding  Extremities: No edema, No lymphangitis, No petechiae, No rashes, no synovitis   Data Reviewed: I have personally reviewed following labs and imaging studies Basic Metabolic Panel: Recent Labs  Lab 05/22/20 0130 05/22/20 1805 05/23/20 0600 05/24/20 0608  NA 138 137 137 138  K 3.0* 3.4* 3.7 3.9  CL 104 101 106 108  CO2 23 28 27 24   GLUCOSE 211* 159* 141* 118*  BUN 35* 33* 31* 32*  CREATININE 1.20 1.19 1.06 1.04  CALCIUM 9.8 9.6 9.2 9.2  MG  --  1.9  --   --    Liver Function  Tests: Recent Labs  Lab 05/22/20 1805  AST 15  ALT 12  ALKPHOS 66  BILITOT 0.7  PROT 6.2*  ALBUMIN 3.5   Recent Labs  Lab 05/22/20 1225  LIPASE 18  AMYLASE 58   No results for input(s): AMMONIA in the last 168 hours. Coagulation Profile: Recent Labs  Lab 05/22/20 1805  INR 1.2   CBC: Recent Labs  Lab 05/22/20 0130 05/22/20 1805 05/23/20 0600 05/24/20 0608  WBC 19.4* 29.7* 29.7* 22.6*  NEUTROABS 17.2* 25.1*  --   --   HGB 14.6 14.1 12.2* 10.8*  HCT 43.8 43.4 38.4* 34.6*  MCV 81.6 82.4 83.3 85.4  PLT 254 265 224 198   Cardiac Enzymes: No results for input(s): CKTOTAL, CKMB, CKMBINDEX, TROPONINI in the last 168 hours. BNP: Invalid input(s): POCBNP CBG: No results for input(s): GLUCAP in the last 168 hours. HbA1C: No results for input(s): HGBA1C in the last 72 hours. Urine analysis:    Component Value Date/Time   COLORURINE YELLOW 05/22/2020 1111   APPEARANCEUR CLEAR 05/22/2020 1111   APPEARANCEUR Clear 02/15/2020 1321   LABSPEC 1.016 05/22/2020 1111   PHURINE 5.0 05/22/2020 1111   GLUCOSEU 150 (A) 05/22/2020 1111   HGBUR NEGATIVE 05/22/2020 1111   BILIRUBINUR NEGATIVE 05/22/2020 1111   BILIRUBINUR Negative 02/15/2020 1321   KETONESUR NEGATIVE 05/22/2020 1111   PROTEINUR >=300 (A) 05/22/2020 1111   UROBILINOGEN negative (A) 10/19/2019 0958   UROBILINOGEN 0.2 05/08/2014 2045   NITRITE NEGATIVE 05/22/2020 1111   LEUKOCYTESUR NEGATIVE 05/22/2020 1111   Sepsis Labs: @LABRCNTIP (procalcitonin:4,lacticidven:4) ) Recent Results (from the past 240 hour(s))  Culture, blood (routine x 2)     Status: None (Preliminary result)   Collection Time: 05/22/20  7:45 AM   Specimen: BLOOD  Result Value Ref Range Status   Specimen Description BLOOD RIGHT ANTECUBITAL DRAWN BY RN  Final   Special Requests   Final    BOTTLES DRAWN AEROBIC AND ANAEROBIC Blood Culture results may not be optimal due to an excessive volume of blood received in culture bottles   Culture    Final    NO GROWTH < 12 HOURS Performed at [facility], [address] [place], [place] [address]    Report Status PENDING  Incomplete  Culture, blood (routine x 2)     Status: None (Preliminary result)   Collection Time: 05/22/20  7:50 AM   Specimen: BLOOD  Result Value Ref Range Status   Specimen Description BLOOD LEFT ANTECUBITAL DRAWN BY RN  Final   Special Requests   Final    BOTTLES DRAWN AEROBIC AND ANAEROBIC Blood Culture results may not be optimal due to an excessive volume of blood received in culture bottles   Culture   Final    NO GROWTH < 12 HOURS Performed at [facility], [address], [place], [place] [address]  Report Status PENDING  Incomplete  Culture, Urine     Status: Abnormal   Collection Time: 05/22/20 11:12 AM   Specimen: Urine, Clean Catch  Result Value Ref Range Status   Specimen Description   Final    URINE, CLEAN CATCH Performed at [facility], [address], [place], [place] [address]    Special Requests   Final    NONE Performed at [facility], [address] [place], [place] [address]    Culture (A)  Final    <10,000 COLONIES/mL INSIGNIFICANT GROWTH Performed at [facility] [address] [place], [place] [address]    Report Status 05/23/2020 FINAL  Final  C Difficile Quick Screen w PCR reflex     Status: None   Collection Time: 05/22/20 11:55 PM   Specimen: STOOL  Result Value Ref Range Status   C Diff antigen NEGATIVE NEGATIVE Final   C Diff toxin NEGATIVE NEGATIVE Final   C Diff interpretation No C. difficile detected.  Final    Comment: Performed at [facility], [address] [place], [place] [address]  Resp Panel by RT-PCR (Flu A&B, Covid) Nasopharyngeal Swab     Status: None   Collection Time: 05/23/20  1:00 AM   Specimen: Nasopharyngeal Swab; Nasopharyngeal(NP) swabs in vial transport medium  Result Value Ref Range Status   SARS Coronavirus 2 by RT PCR NEGATIVE NEGATIVE Final    Comment: (NOTE) SARS-CoV-2 target  nucleic acids are NOT DETECTED.  The SARS-CoV-2 RNA is generally detectable in upper respiratory specimens during the acute phase of infection. The lowest concentration of SARS-CoV-2 viral copies this assay can detect is 138 copies/mL. A negative result does not preclude SARS-Cov-2 infection and should not be used as the sole basis for treatment or other patient management decisions. A negative result may occur with  improper specimen collection/handling, submission of specimen other than nasopharyngeal swab, presence of viral mutation(s) within the areas targeted by this assay, and inadequate number of viral copies(<138 copies/mL). A negative result must be combined with clinical observations, patient history, and epidemiological information. The expected result is Negative.  Fact Sheet for Patients:  EntrepreneurPulse.com.au  Fact Sheet for Healthcare Providers:  IncredibleEmployment.be  This test is no t yet approved or cleared by the [place] FDA and  has been authorized for detection and/or diagnosis of SARS-CoV-2 by FDA under an Emergency Use Authorization (EUA). This EUA will remain  in effect (meaning this test can be used) for the duration of the COVID-19 declaration under Section 564(b)(1) of the Act, 21 U.S.C.section 360bbb-3(b)(1), unless the authorization is terminated  or revoked sooner.       Influenza A by PCR NEGATIVE NEGATIVE Final   Influenza B by PCR NEGATIVE NEGATIVE Final    Comment: (NOTE) The Xpert Xpress SARS-CoV-2/FLU/RSV plus assay is intended as an aid in the diagnosis of influenza from Nasopharyngeal swab specimens and should not be used as a sole basis for treatment. Nasal washings and aspirates are unacceptable for Xpert Xpress SARS-CoV-2/FLU/RSV testing.  Fact Sheet for Patients: EntrepreneurPulse.com.au  Fact Sheet for Healthcare  Providers: IncredibleEmployment.be  This test is not yet approved or cleared by the [place] FDA and has been authorized for detection and/or diagnosis of SARS-CoV-2 by FDA under an Emergency Use Authorization (EUA). This EUA will remain in effect (meaning this test can be used) for the duration of the COVID-19 declaration under Section 564(b)(1) of the Act, 21 U.S.C. section 360bbb-3(b)(1), unless the authorization is terminated or  revoked.  Performed at [facility] [address], [place], [place] [address]   MRSA PCR Screening     Status: None   Collection Time: 05/23/20  2:53 PM   Specimen: Nasal Mucosa; Nasopharyngeal  Result Value Ref Range Status   MRSA by PCR NEGATIVE NEGATIVE Final    Comment:        The GeneXpert MRSA Assay (FDA approved for NASAL specimens only), is one component of a comprehensive MRSA colonization surveillance program. It is not intended to diagnose MRSA infection nor to guide or monitor treatment for MRSA infections. Performed at [facility], [address], [place], [place] [address]      Scheduled Meds: . alfuzosin  10 mg Oral Q breakfast  . diltiazem  360 mg Oral Daily  . hydrALAZINE  100 mg Oral TID  . memantine  10 mg Oral BID  . venlafaxine XR  75 mg Oral Q breakfast   Continuous Infusions: . cefTRIAXone (ROCEPHIN)  IV Stopped (05/24/20 0630)  . lactated ringers 100 mL/hr at 05/23/20 2204  . metronidazole Stopped (05/24/20 1146)    Procedures/Studies: CT ABDOMEN PELVIS W CONTRAST  Result Date: 05/22/2020 CLINICAL DATA:  Abdominal pain, fever, rectal bleeding EXAM: CT ABDOMEN AND PELVIS WITH CONTRAST TECHNIQUE: Multidetector CT imaging of the abdomen and pelvis was performed using the standard protocol following bolus administration of intravenous contrast. CONTRAST:  110mL OMNIPAQUE IOHEXOL 300 MG/ML  SOLN COMPARISON:  May 2019 FINDINGS: Lower chest: Bibasilar atelectasis/scarring. Hepatobiliary: Small cyst  of the left hepatic lobe. Few small layering gallstones. No biliary dilatation. Pancreas: Unremarkable. Spleen: Unremarkable. Adrenals/Urinary Tract: Adrenal glands are unremarkable. Bilateral renal cysts. 3 mm nonobstructing calculus of the lower pole the right kidney. Small bladder diverticula. Stomach/Bowel: Stomach is within normal limits. Distal colonic diverticulosis. There is wall thickening along the distal transverse colon, splenic flexure, and descending colon. Vascular/Lymphatic: Aortic atherosclerosis. No enlarged lymph nodes. Reproductive: Unremarkable. Other: Small volume abdominopelvic ascites. No acute abnormality of the abdominal wall. Musculoskeletal: Right femoral fixation with associated streak artifact. Degenerative changes of the lumbar spine. No acute osseous abnormality. IMPRESSION: Findings consistent with colitis involving the distal transverse colon, splenic flexure, and descending colon. This is likely ischemic or infectious in etiology. Diverticulosis is present but mostly distal to the area of thickening and diverticulitis is therefore less likely. 3 mm nonobstructing left renal calculus. Small bladder diverticula. Electronically Signed   By: Macy Mis M.D.   On: 05/22/2020 20:12   DG Pelvis Portable  Result Date: 05/22/2020 CLINICAL DATA:  Fall, pelvic injury EXAM: PORTABLE PELVIS 1-2 VIEWS COMPARISON:  None. FINDINGS: ORIF of a right subcapital femoral neck fracture has been performed utilizing 4 partially-threaded screws with incomplete healing of the a fracture. No acute fracture or dislocation. Mild bilateral hip joint space narrowing is present in keeping with at least mild bilateral degenerative arthritis. Contrast is seen within the bladder lumen. Small bladder diverticulum present as well as trabeculation of the bladder wall is present in keeping with changes of probable chronic bladder outlet obstruction. Defect involving the bladder base likely represents mass  effect from a hypertrophied central prostatic lobe. IMPRESSION: Status post right hip ORIF.  No acute fracture or dislocation. Findings in keeping with changes of chronic bladder outlet obstruction, possibly related to central prostatic hypertrophy. Electronically Signed   By: Fidela Salisbury MD   On: 05/22/2020 23:07   DG Chest Port 1 View  Result Date: 05/22/2020 CLINICAL DATA:  Questionable sepsis - evaluate for abnormality Patient  reports dizziness leading to fall and abdominal pain. EXAM: PORTABLE CHEST 1 VIEW COMPARISON:  Radiograph 03/12/2020 FINDINGS: Improved cardiomegaly from prior. Unchanged mediastinal contours with aortic atherosclerosis and tortuosity. Chronic interstitial coarsening. Streaky atelectasis at the right lung base. No confluent consolidation. No pulmonary edema or large pleural effusion. No pneumothorax. No acute osseous abnormalities are seen. IMPRESSION: 1. Mild right basilar atelectasis. 2. Improved cardiomegaly from prior. Stable aortic atherosclerosis and tortuosity. Electronically Signed   By: Keith Rake M.D.   On: 05/22/2020 18:50    Orson Eva, DO  [facility]  If 7PM-7AM, please contact night-coverage [email or web address] Password [email or web address] 05/24/2020, 6:01 PM   LOS: 2 days

## 2020-05-25 ENCOUNTER — Encounter (HOSPITAL_COMMUNITY): Payer: Self-pay | Admitting: Internal Medicine

## 2020-05-25 ENCOUNTER — Inpatient Hospital Stay (HOSPITAL_COMMUNITY): Payer: Medicare Other

## 2020-05-25 DIAGNOSIS — K922 Gastrointestinal hemorrhage, unspecified: Secondary | ICD-10-CM

## 2020-05-25 LAB — BASIC METABOLIC PANEL
Anion gap: 6 (ref 5–15)
BUN: 24 mg/dL — ABNORMAL HIGH (ref 8–23)
CO2: 26 mmol/L (ref 22–32)
Calcium: 8.9 mg/dL (ref 8.9–10.3)
Chloride: 105 mmol/L (ref 98–111)
Creatinine, Ser: 0.86 mg/dL (ref 0.61–1.24)
GFR, Estimated: >60 mL/min (ref >60)
Glucose, Bld: 109 mg/dL — ABNORMAL HIGH (ref 70–99)
Potassium: 3.4 mmol/L — ABNORMAL LOW (ref 3.5–5.1)
Sodium: 137 mmol/L (ref 135–145)

## 2020-05-25 LAB — HEMOGLOBIN AND HEMATOCRIT, BLOOD
HCT: 36.9 % — ABNORMAL LOW (ref 39.0–52.0)
Hemoglobin: 10.8 g/dL — ABNORMAL LOW (ref 13.0–17.0)

## 2020-05-25 LAB — CBC
HCT: 34.8 % — ABNORMAL LOW (ref 39.0–52.0)
Hemoglobin: 10.8 g/dL — ABNORMAL LOW (ref 13.0–17.0)
MCH: 26.3 pg (ref 26.0–34.0)
MCHC: 31 g/dL (ref 30.0–36.0)
MCV: 84.7 fL (ref 80.0–100.0)
Platelets: 212 10*3/uL (ref 150–400)
RBC: 4.11 MIL/uL — ABNORMAL LOW (ref 4.22–5.81)
RDW: 15.3 % (ref 11.5–15.5)
WBC: 14.2 10*3/uL — ABNORMAL HIGH (ref 4.0–10.5)
nRBC: 0 % (ref 0.0–0.2)

## 2020-05-25 LAB — MAGNESIUM: Magnesium: 1.9 mg/dL (ref 1.7–2.4)

## 2020-05-25 MED ORDER — ALUM & MAG HYDROXIDE-SIMETH 200-200-20 MG/5ML PO SUSP
30.0000 mL | Freq: Four times a day (QID) | ORAL | Status: DC | PRN
  Administered 2020-05-25: 21:00:00 30 mL via ORAL
  Filled 2020-05-25: qty 30

## 2020-05-25 MED ORDER — IOHEXOL 350 MG/ML SOLN
100.0000 mL | Freq: Once | INTRAVENOUS | Status: AC | PRN
  Administered 2020-05-25: 100 mL via INTRAVENOUS

## 2020-05-25 MED ORDER — POTASSIUM CHLORIDE CRYS ER 20 MEQ PO TBCR
20.0000 meq | EXTENDED_RELEASE_TABLET | Freq: Once | ORAL | Status: AC
  Administered 2020-05-25: 18:00:00 20 meq via ORAL
  Filled 2020-05-25: qty 1

## 2020-05-25 NOTE — Progress Notes (Signed)
PROGRESS NOTE  Jeffrey Sherman LOV:[id number] DOB: 11-11-1929 DOA: 05/22/2020 PCP: Celene Squibb, MD  Brief History:  86 y.o.malewith medical history significant forprostate cancer, dementia, hypertension, peptic ulcer disease. [facility]Patient's symptoms started 05/21/20, with nausea vomiting and diarrhea. There was some bright red blood in his stool. He also reports vomiting.Patient reports abdominal pain was very severe,and all over his abdomen particularly left side of abdomen.  ED Course:, Tachycardic to 116, intermittent tachypnea to 24, blood pressure systolic 884Z to 660Y, O2 sats greater than 91% on room air. Potassium 3.4. WBC 29. Abd CT-consistent with colitis involving the distal transverse, splenic flexure and descending colon-infectious versus hischemic etiology. Portable chest x-ray unremarkable. IV cefepime and metronidazole. Hospitalist to admit for likely infectious colitis.  Assessment/Plan:   Sepsis secondary to colitis -Patient presented with fever, leukocytosis, tachycardia and tachypnea -Abdominal CT indicated distal transverse splenic flexure and sigmoid colon colitis -Stool for C. difficile found to be negative -only one small streak BM with blood 12/16 -Patient is currently on intravenous antibiotics -Continue IV LR -Repeat labs in a.m. -suspect ischemic colitis -continue full liquid -appreciate GI consult>>>CTA abd/pelvis  Hematochezia/Ischemic Colitis -due to ischemic colitis -small amount hematocheazia "streaks" on 12/16 -drop in Hb due to dilution  Hypertension -Continued on diltiazem and hydralazine -Lisinopril currently on hold since he was exposed to contrast -BP remains controlled  Prostate cancer -Continue follow-up with urology -Continue on alfuzosin  Dementia without behavioral disturbance -continue Namenda  Hypokalemia -replete -check  mag       Status is: Inpatient  Remains inpatient appropriate because:IV treatments appropriate due to intensity of illness or inability to take PO   Dispo: The patient is from: SNF  Anticipated d/c is to: SNF  Anticipated d/c date is: 1 day  Patient currently is not medically stable to d/c.        Family Communication:   No Family at bedside  Consultants:  none  Code Status: DNR  DVT Prophylaxis:  SCDs   Procedures: As Listed in Progress Note Above  Antibiotics: Ceftriaxone 12/14>> Cefepime 12/13>>12/14 metronidazole 12/13>>     Subjective: Patient had some abd pain with distension today.  Only streak of BM today.  Denies cp, sob,  N/v/d.  Objective: Vitals:   05/24/20 2047 05/25/20 0509 05/25/20 1006 05/25/20 1404  BP: (!) 149/68 (!) 160/82 (!) 178/95 (!) 163/81  Pulse: 80 86  87  Resp: 18 19  18   Temp: 98.5 F (36.9 C) 97.9 F (36.6 C)  98.7 F (37.1 C)  TempSrc:    Oral  SpO2: 96% 92%  90%  Weight:      Height:        Intake/Output Summary (Last 24 hours) at 05/25/2020 1747 Last data filed at 05/25/2020 1644 Gross per 24 hour  Intake --  Output 1600 ml  Net -1600 ml   Weight change:  Exam:   General:  Pt is alert, follows commands appropriately, not in acute distress  HEENT: No icterus, No thrush, No neck mass, [place]/AT  Cardiovascular: RRR, S1/S2, no rubs, no gallops  Respiratory: bibasilar rales. No wheeze  Abdomen: Soft/+BS, non tender, non distended, no guarding  Extremities: No edema, No lymphangitis, No petechiae, No rashes, no synovitis   Data Reviewed: I have personally reviewed following labs and imaging studies Basic Metabolic Panel: Recent Labs  Lab 05/22/20 0130 05/22/20 1805 05/23/20 0600 05/24/20 3016  05/25/20 0636  NA 138 137 137 138 137  K 3.0* 3.4* 3.7 3.9 3.4*  CL 104 101 106 108 105  CO2 23 28 27 24 26   GLUCOSE 211* 159* 141* 118* 109*   BUN 35* 33* 31* 32* 24*  CREATININE 1.20 1.19 1.06 1.04 0.86  CALCIUM 9.8 9.6 9.2 9.2 8.9  MG  --  1.9  --   --  1.9   Liver Function Tests: Recent Labs  Lab 05/22/20 1805  AST 15  ALT 12  ALKPHOS 66  BILITOT 0.7  PROT 6.2*  ALBUMIN 3.5   Recent Labs  Lab 05/22/20 1225  LIPASE 18  AMYLASE 58   No results for input(s): AMMONIA in the last 168 hours. Coagulation Profile: Recent Labs  Lab 05/22/20 1805  INR 1.2   CBC: Recent Labs  Lab 05/22/20 0130 05/22/20 1805 05/23/20 0600 05/24/20 0608 05/25/20 0636  WBC 19.4* 29.7* 29.7* 22.6* 14.2*  NEUTROABS 17.2* 25.1*  --   --   --   HGB 14.6 14.1 12.2* 10.8* 10.8*  HCT 43.8 43.4 38.4* 34.6* 34.8*  MCV 81.6 82.4 83.3 85.4 84.7  PLT 254 265 224 198 212   Cardiac Enzymes: No results for input(s): CKTOTAL, CKMB, CKMBINDEX, TROPONINI in the last 168 hours. BNP: Invalid input(s): POCBNP CBG: No results for input(s): GLUCAP in the last 168 hours. HbA1C: No results for input(s): HGBA1C in the last 72 hours. Urine analysis:    Component Value Date/Time   COLORURINE YELLOW 05/22/2020 1111   APPEARANCEUR CLEAR 05/22/2020 1111   APPEARANCEUR Clear 02/15/2020 1321   LABSPEC 1.016 05/22/2020 1111   PHURINE 5.0 05/22/2020 1111   GLUCOSEU 150 (A) 05/22/2020 1111   HGBUR NEGATIVE 05/22/2020 1111   BILIRUBINUR NEGATIVE 05/22/2020 1111   BILIRUBINUR Negative 02/15/2020 1321   KETONESUR NEGATIVE 05/22/2020 1111   PROTEINUR >=300 (A) 05/22/2020 1111   UROBILINOGEN negative (A) 10/19/2019 0958   UROBILINOGEN 0.2 05/08/2014 2045   NITRITE NEGATIVE 05/22/2020 1111   LEUKOCYTESUR NEGATIVE 05/22/2020 1111   Sepsis Labs: @LABRCNTIP (procalcitonin:4,lacticidven:4) ) Recent Results (from the past 240 hour(s))  Culture, blood (routine x 2)     Status: None (Preliminary result)   Collection Time: 05/22/20  7:45 AM   Specimen: BLOOD  Result Value Ref Range Status   Specimen Description BLOOD RIGHT ANTECUBITAL DRAWN BY RN  Final    Special Requests   Final    BOTTLES DRAWN AEROBIC AND ANAEROBIC Blood Culture results may not be optimal due to an excessive volume of blood received in culture bottles   Culture   Final    NO GROWTH 3 DAYS Performed at [facility], [address], [place], [place] [address]    Report Status PENDING  Incomplete  Culture, blood (routine x 2)     Status: None (Preliminary result)   Collection Time: 05/22/20  7:50 AM   Specimen: BLOOD  Result Value Ref Range Status   Specimen Description BLOOD LEFT ANTECUBITAL DRAWN BY RN  Final   Special Requests   Final    BOTTLES DRAWN AEROBIC AND ANAEROBIC Blood Culture results may not be optimal due to an excessive volume of blood received in culture bottles   Culture   Final    NO GROWTH 3 DAYS Performed at [facility], [address] [place], [place] [address]    Report Status PENDING  Incomplete  Culture, Urine     Status: Abnormal   Collection Time: 05/22/20 11:12 AM   Specimen: Urine, Clean  Catch  Result Value Ref Range Status   Specimen Description   Final    URINE, CLEAN CATCH Performed at [facility], [address] [place], [place] [address]    Special Requests   Final    NONE Performed at [facility], [address], [place], [place] [address]    Culture (A)  Final    <10,000 COLONIES/mL INSIGNIFICANT GROWTH Performed at [place] [address] [place], [place] [address]    Report Status 05/23/2020 FINAL  Final  Blood Culture (routine x 2)     Status: None (Preliminary result)   Collection Time: 05/22/20  6:18 PM   Specimen: BLOOD  Result Value Ref Range Status   Specimen Description BLOOD RIGHT ANTECUBITAL  Final   Special Requests   Final    BOTTLES DRAWN AEROBIC AND ANAEROBIC Blood Culture adequate volume   Culture   Final    NO GROWTH 3 DAYS Performed at [facility], [address] [place], [place] [address]    Report Status PENDING  Incomplete  Blood Culture (routine x 2)     Status: None (Preliminary  result)   Collection Time: 05/22/20  6:23 PM   Specimen: BLOOD  Result Value Ref Range Status   Specimen Description BLOOD BLOOD RIGHT WRIST  Final   Special Requests   Final    BOTTLES DRAWN AEROBIC AND ANAEROBIC Blood Culture adequate volume   Culture   Final    NO GROWTH 3 DAYS Performed at [facility], [address], [place], [place] [address]    Report Status PENDING  Incomplete  C Difficile Quick Screen w PCR reflex     Status: None   Collection Time: 05/22/20 11:55 PM   Specimen: STOOL  Result Value Ref Range Status   C Diff antigen NEGATIVE NEGATIVE Final   C Diff toxin NEGATIVE NEGATIVE Final   C Diff interpretation No C. difficile detected.  Final    Comment: Performed at [facility], [address], [place], [place] [address]  Resp Panel by RT-PCR (Flu A&B, Covid) Nasopharyngeal Swab     Status: None   Collection Time: 05/23/20  1:00 AM   Specimen: Nasopharyngeal Swab; Nasopharyngeal(NP) swabs in vial transport medium  Result Value Ref Range Status   SARS Coronavirus 2 by RT PCR NEGATIVE NEGATIVE Final    Comment: (NOTE) SARS-CoV-2 target nucleic acids are NOT DETECTED.  The SARS-CoV-2 RNA is generally detectable in upper respiratory specimens during the acute phase of infection. The lowest concentration of SARS-CoV-2 viral copies this assay can detect is 138 copies/mL. A negative result does not preclude SARS-Cov-2 infection and should not be used as the sole basis for treatment or other patient management decisions. A negative result may occur with  improper specimen collection/handling, submission of specimen other than nasopharyngeal swab, presence of viral mutation(s) within the areas targeted by this assay, and inadequate number of viral copies(<138 copies/mL). A negative result must be combined with clinical observations, patient history, and epidemiological information. The expected result is Negative.  Fact Sheet for Patients:   EntrepreneurPulse.com.au  Fact Sheet for Healthcare Providers:  IncredibleEmployment.be  This test is no t yet approved or cleared by the [place] FDA and  has been authorized for detection and/or diagnosis of SARS-CoV-2 by FDA under an Emergency Use Authorization (EUA). This EUA will remain  in effect (meaning this test can be used) for the duration of the COVID-19 declaration under Section 564(b)(1) of the Act, 21 U.S.C.section  360bbb-3(b)(1), unless the authorization is terminated  or revoked sooner.       Influenza A by PCR NEGATIVE NEGATIVE Final   Influenza B by PCR NEGATIVE NEGATIVE Final    Comment: (NOTE) The Xpert Xpress SARS-CoV-2/FLU/RSV plus assay is intended as an aid in the diagnosis of influenza from Nasopharyngeal swab specimens and should not be used as a sole basis for treatment. Nasal washings and aspirates are unacceptable for Xpert Xpress SARS-CoV-2/FLU/RSV testing.  Fact Sheet for Patients: EntrepreneurPulse.com.au  Fact Sheet for Healthcare Providers: IncredibleEmployment.be  This test is not yet approved or cleared by the [place] FDA and has been authorized for detection and/or diagnosis of SARS-CoV-2 by FDA under an Emergency Use Authorization (EUA). This EUA will remain in effect (meaning this test can be used) for the duration of the COVID-19 declaration under Section 564(b)(1) of the Act, 21 U.S.C. section 360bbb-3(b)(1), unless the authorization is terminated or revoked.  Performed at [facility], [address], [place], [place] [address]   MRSA PCR Screening     Status: None   Collection Time: 05/23/20  2:53 PM   Specimen: Nasal Mucosa; Nasopharyngeal  Result Value Ref Range Status   MRSA by PCR NEGATIVE NEGATIVE Final    Comment:        The GeneXpert MRSA Assay (FDA approved for NASAL specimens only), is one component of a comprehensive MRSA  colonization surveillance program. It is not intended to diagnose MRSA infection nor to guide or monitor treatment for MRSA infections. Performed at [facility], [address] [place], [place] [address]      Scheduled Meds: . alfuzosin  10 mg Oral Q breakfast  . diltiazem  360 mg Oral Daily  . hydrALAZINE  100 mg Oral TID  . memantine  10 mg Oral BID  . venlafaxine XR  75 mg Oral Q breakfast   Continuous Infusions: . cefTRIAXone (ROCEPHIN)  IV 2 g (05/25/20 3151)  . lactated ringers 100 mL/hr at 05/24/20 1854  . metronidazole 500 mg (05/25/20 1218)    Procedures/Studies: CT ABDOMEN PELVIS W CONTRAST  Result Date: 05/22/2020 CLINICAL DATA:  Abdominal pain, fever, rectal bleeding EXAM: CT ABDOMEN AND PELVIS WITH CONTRAST TECHNIQUE: Multidetector CT imaging of the abdomen and pelvis was performed using the standard protocol following bolus administration of intravenous contrast. CONTRAST:  169mL OMNIPAQUE IOHEXOL 300 MG/ML  SOLN COMPARISON:  May 2019 FINDINGS: Lower chest: Bibasilar atelectasis/scarring. Hepatobiliary: Small cyst of the left hepatic lobe. Few small layering gallstones. No biliary dilatation. Pancreas: Unremarkable. Spleen: Unremarkable. Adrenals/Urinary Tract: Adrenal glands are unremarkable. Bilateral renal cysts. 3 mm nonobstructing calculus of the lower pole the right kidney. Small bladder diverticula. Stomach/Bowel: Stomach is within normal limits. Distal colonic diverticulosis. There is wall thickening along the distal transverse colon, splenic flexure, and descending colon. Vascular/Lymphatic: Aortic atherosclerosis. No enlarged lymph nodes. Reproductive: Unremarkable. Other: Small volume abdominopelvic ascites. No acute abnormality of the abdominal wall. Musculoskeletal: Right femoral fixation with associated streak artifact. Degenerative changes of the lumbar spine. No acute osseous abnormality. IMPRESSION: Findings consistent with colitis involving the distal  transverse colon, splenic flexure, and descending colon. This is likely ischemic or infectious in etiology. Diverticulosis is present but mostly distal to the area of thickening and diverticulitis is therefore less likely. 3 mm nonobstructing left renal calculus. Small bladder diverticula. Electronically Signed   By: Macy Mis M.D.   On: 05/22/2020 20:12   DG Pelvis Portable  Result Date: 05/22/2020 CLINICAL DATA:  Fall, pelvic injury EXAM:  PORTABLE PELVIS 1-2 VIEWS COMPARISON:  None. FINDINGS: ORIF of a right subcapital femoral neck fracture has been performed utilizing 4 partially-threaded screws with incomplete healing of the a fracture. No acute fracture or dislocation. Mild bilateral hip joint space narrowing is present in keeping with at least mild bilateral degenerative arthritis. Contrast is seen within the bladder lumen. Small bladder diverticulum present as well as trabeculation of the bladder wall is present in keeping with changes of probable chronic bladder outlet obstruction. Defect involving the bladder base likely represents mass effect from a hypertrophied central prostatic lobe. IMPRESSION: Status post right hip ORIF.  No acute fracture or dislocation. Findings in keeping with changes of chronic bladder outlet obstruction, possibly related to central prostatic hypertrophy. Electronically Signed   By: Fidela Salisbury MD   On: 05/22/2020 23:07   DG Chest Port 1 View  Result Date: 05/22/2020 CLINICAL DATA:  Questionable sepsis - evaluate for abnormality Patient reports dizziness leading to fall and abdominal pain. EXAM: PORTABLE CHEST 1 VIEW COMPARISON:  Radiograph 03/12/2020 FINDINGS: Improved cardiomegaly from prior. Unchanged mediastinal contours with aortic atherosclerosis and tortuosity. Chronic interstitial coarsening. Streaky atelectasis at the right lung base. No confluent consolidation. No pulmonary edema or large pleural effusion. No pneumothorax. No acute osseous abnormalities  are seen. IMPRESSION: 1. Mild right basilar atelectasis. 2. Improved cardiomegaly from prior. Stable aortic atherosclerosis and tortuosity. Electronically Signed   By: Keith Rake M.D.   On: 05/22/2020 18:50    Orson Eva, DO  [facility]  If 7PM-7AM, please contact night-coverage [email or web address] Password [email or web address] 05/25/2020, 5:47 PM   LOS: 3 days

## 2020-05-25 NOTE — Consult Note (Signed)
Referring Provider: Orson Eva, MD Primary Care Physician:  Celene Squibb, MD Primary Gastroenterologist: Dr. Laural Golden Admission date: 05/22/2020 Consultation date: 05/25/2020  Reason for Consultation: Ischemic colitis  HPI: Jeffrey Sherman is a 86 y.o. male with history of prostate cancer, GERD, peptic ulcer disease, hypertension, recent hip fracture October 2021 presenting from the nursing home with abdominal pain, vomiting, diarrhea.  Symptoms started sunday.  He has been having bright red blood per rectum.  In the ED he was tachycardic with pulse of 116, tachypneic to 24, systolic blood pressures 921J to 180s.  O2 sats greater than 91% on room air.  Temp 100.3 lactic acid 2.1.  White blood cell count 29,000.  Potassium 3.4.  CT abdomen consistent with colitis involving the distal transverse, splenic flexure, descending colon.  Infectious versus ischemic etiology.  He was started on IV cefepime and IV metronidazole.  Patient was started on oral vancomycin prophylactically pending stool C. difficile testing considering significant leukocytosis. Has since been discontinued.   Last colonoscopy on the records, 2005.  Since admission C. difficile negative.  Admitting hemoglobin of 14.6, down to 10.8 yesterday and stable at 10.8 today.  Notably at discharge on October 7 his hemoglobin was 10.7.  SARS coronavirus 2 neg. GI pathogen panel ordered yesterday not collected.  Blood cultures remain negative.  Patient states his symptoms started day before admission.  Developed nausea and vomiting, diarrhea with blood in stool, left-sided abdominal pain.  States his nausea and vomiting quickly resolved.  He had several bloody burgundy colored stools in the emergency department on December 13.  States he did okay Tuesday and Wednesday, actually was supposed to be discharged today but this morning he started having left-sided abdominal pain and had another bloody bowel movement.  Nurses report burgundy colored  stool.  Patient states he has had a couple of this morning.  He denies any nausea or vomiting.  Continues to have significant right hip pain.  No other complaints.    Prior to Admission medications   Medication Sig Start Date End Date Taking? Authorizing Provider  acetaminophen (TYLENOL) 500 MG tablet Take 1,000 mg by mouth in the morning, at noon, and at bedtime. 03/20/20  Yes [provider]  alfuzosin (UROXATRAL) 10 MG 24 hr tablet Take 10 mg by mouth daily with breakfast.   Yes [provider]  Amino Acids-Protein Hydrolys (PRO-STAT PO) liquid; 15-100 gram-kcal/30 mL; amt: 30 ml; oral Special Instructions: for albumin 2.5 With Meals 03/16/20  Yes [provider]  Ascorbic Acid (VITAMIN C) 1000 MG tablet Take 1,000 mg by mouth daily.   Yes [provider]  aspirin 81 MG chewable tablet Chew 81 mg by mouth daily. 04/15/20  Yes [provider]  [facility]) OINT Special Instructions: Apply to bilateral buttocks & sacral area qshift for blanchable erythema. 03/15/20  Yes [provider]  calcium carbonate (TUMS - DOSED IN MG ELEMENTAL CALCIUM) 500 MG chewable tablet Chew 1 tablet by mouth 2 (two) times daily as needed for indigestion or heartburn.    Yes [provider]  cloNIDine (CATAPRES) 0.1 MG tablet Take 0.1 mg by mouth See admin instructions. Stat-immediately due to elavated blood pressure   Yes [provider]  diltiazem (CARDIZEM CD) 360 MG 24 hr capsule Take 360 mg by mouth daily. Start 03/23/2020   Yes [provider]  ferrous sulfate 325 (65 FE) MG tablet Take 325 mg by mouth daily with breakfast. For Anemia and  Iron  Deficiency 03/17/20  Yes [provider]  Flax Oil-Fish Oil-Borage Oil (FISH OIL-FLAX OIL-BORAGE OIL) CAPS Take 1 capsule by mouth daily.   Yes [provider]  hydrALAZINE (APRESOLINE) 100 MG tablet Take 100 mg by mouth 3 (three) times daily.   Yes [provider]  lisinopril (ZESTRIL) 40 MG tablet Take 1 tablet (40 mg total) by mouth daily. 03/14/20  Yes Johnson, Clanford L, MD  Melatonin 10 MG TABS Take 10 mg by mouth at bedtime.   Yes [provider]  memantine (NAMENDA) 10 MG tablet Take 10 mg by mouth 2 (two) times daily.   Yes [provider]  omeprazole (PRILOSEC) 20 MG capsule Take 1 capsule (20 mg total) by mouth daily before breakfast. 03/14/20  Yes Johnson, Clanford L, MD  polyethylene glycol (MIRALAX / GLYCOLAX) 17 g packet Take 17 g by mouth daily.   Yes [provider]  potassium chloride SA (KLOR-CON) 20 MEQ tablet Take 20 mEq by mouth 2 (two) times daily.  03/23/20  Yes [provider]  rOPINIRole (REQUIP) 0.25 MG tablet Take 0.25 mg by mouth at bedtime. 04/21/20  Yes [provider]  salicylic acid 17 % gel Apply 1 application topically in the morning and at bedtime. Apply to warts to left hand   Yes [provider]  senna-docusate (SENOKOT-S) 8.6-50 MG tablet Take 1 tablet by mouth at bedtime as needed for mild constipation. 03/14/20  Yes Johnson, Clanford L, MD  terazosin (HYTRIN) 5 MG capsule Take 5 mg by mouth at bedtime.  03/14/20  Yes [provider]  venlafaxine XR (EFFEXOR-XR) 150 MG 24 hr capsule Take 75 mg by mouth daily with breakfast. 05/17/20  Yes [provider]  NON FORMULARY Diet - Mechanical Soft 03/14/20   [provider]  OXYGEN Inhale 2 L into the lungs continuous. 03/14/20   [provider]    Current Facility-Administered Medications  Medication Dose Route Frequency Provider Last Rate Last Admin  . acetaminophen (TYLENOL) tablet 650 mg  650 mg Oral Q6H PRN Emokpae, Ejiroghene E, MD       Or  . acetaminophen (TYLENOL) suppository 650 mg  650 mg Rectal Q6H PRN Emokpae, Ejiroghene E, MD      . alfuzosin (UROXATRAL) 24 hr tablet 10 mg  10 mg Oral Q breakfast Emokpae, Ejiroghene E, MD   10 mg at 05/25/20 1011  . cefTRIAXone  (ROCEPHIN) 2 g in sodium chloride 0.9 % 100 mL IVPB  2 g Intravenous Q24H Emokpae, Ejiroghene E, MD 200 mL/hr at 05/25/20 0614 2 g at 05/25/20 0614  . diltiazem (CARDIZEM CD) 24 hr capsule 360 mg  360 mg Oral Daily Emokpae, Ejiroghene E, MD   360 mg at 05/25/20 1011  . hydrALAZINE (APRESOLINE) tablet 100 mg  100 mg Oral TID Emokpae, Ejiroghene E, MD   100 mg at 05/25/20 1011  . lactated ringers infusion   Intravenous Continuous Kathie Dike, MD 100 mL/hr at 05/24/20 1854 New Bag at 05/24/20 1854  . melatonin tablet 9 mg  9 mg Oral QHS PRN Lang Snow, FNP      . memantine (NAMENDA) tablet 10 mg  10 mg Oral BID Kathie Dike, MD   10 mg at 05/25/20 1011  . metroNIDAZOLE (FLAGYL) IVPB 500 mg  500 mg Intravenous Q8H Emokpae, Ejiroghene E, MD 100 mL/hr at 05/25/20 0359 500 mg at 05/25/20 0359  . morphine 2 MG/ML injection 2 mg  2 mg Intravenous Q4H PRN Emokpae, Ejiroghene E,  MD   2 mg at 05/25/20 1005  . promethazine (PHENERGAN) tablet 12.5 mg  12.5 mg Oral Q6H PRN Emokpae, Ejiroghene E, MD      . venlafaxine XR (EFFEXOR-XR) 24 hr capsule 75 mg  75 mg Oral Q breakfast Emokpae, Ejiroghene E, MD   75 mg at 05/25/20 1010    Allergies as of 05/22/2020  . (No Known Allergies)    Past Medical History:  Diagnosis Date  . Anxiety   . Cancer [facility])    prostate  . Chronic chest wall pain   . Essential hypertension, benign 06/20/2016  . GAD (generalized anxiety disorder)   . GERD (gastroesophageal reflux disease)   . H/O echocardiogram 2016   normal  . Heart murmur    "leaking heart valve"  . High cholesterol 06/20/2016  . HOH (hard of hearing)    left  . Hx of falling   . Hyperlipidemia   . Interstitial pulmonary disease ([place])   . MDD (major depressive disorder)   . Murmur, cardiac   . Poor historian   . PUD (peptic ulcer disease)   . PUD (peptic ulcer disease)   . RLS (restless legs syndrome)   . Shortness of breath   . Wrist fracture 12/16/2011    Past Surgical History:   Procedure Laterality Date  . COLONOSCOPY  2005   Rehman: Sigmoid colon diverticulosis, moderate sized external hemorrhoids. A few focal areas of mucosal erythema felt to be nonspecific.  Marland Kitchen ESOPHAGOGASTRODUODENOSCOPY    . ESOPHAGOGASTRODUODENOSCOPY N/A 07/18/2016   Rehman: normal exam except duodenal scar. h.pylori serologies negative  . HIP PINNING,CANNULATED Right 03/10/2020   Procedure: INTERNAL FIXATION RIGHT HIP;  Surgeon: Carole Civil, MD;  Location: [facility];  Service: Orthopedics;  Laterality: Right;  . ORIF WRIST FRACTURE  12/19/2011   Procedure: OPEN REDUCTION INTERNAL FIXATION (ORIF) WRIST FRACTURE;  Surgeon: Carole Civil, MD;  Location: [facility];  Service: Orthopedics;  Laterality: Right;  . prostate cancer     Diagnosed in the 90s  . TONSILLECTOMY      Family History  Problem Relation Age of Onset  . Cancer Other   . Heart attack Mother   . Cancer Brother   . Heart attack Brother   . Heart disease Sister        by pass    Social History   Socioeconomic History  . Marital status: Widowed    Spouse name: Not on file  . Number of children: Not on file  . Years of education: Not on file  . Highest education level: Not on file  Occupational History  . Occupation: retired    Fish farm manager: RETIRED  Tobacco Use  . Smoking status: Never Smoker  . Smokeless tobacco: Never Used  Vaping Use  . Vaping Use: Never used  Substance and Sexual Activity  . Alcohol use: No  . Drug use: No  . Sexual activity: Not on file  Other Topics Concern  . Not on file  Social History Narrative  . Not on file   Social Determinants of Health   Financial Resource Strain: Not on file  Food Insecurity: Not on file  Transportation Needs: Not on file  Physical Activity: Not on file  Stress: Not on file  Social Connections: Not on file  Intimate Partner Violence: Not on file     ROS:  General: Negative for anorexia, weight loss, fever, chills, fatigue, positive weakness. Eyes:  Negative for vision changes.  ENT: Negative for hoarseness, difficulty  swallowing , nasal congestion. CV: Negative for chest pain, angina, palpitations, dyspnea on exertion, peripheral edema.  Respiratory: Negative for dyspnea at rest, dyspnea on exertion, cough, sputum, wheezing.  GI: See history of present illness. GU:  Negative for dysuria, hematuria, urinary incontinence, urinary frequency, nocturnal urination.  MS: Positive right hip pain  Derm: Negative for rash or itching.  Neuro: Negative for weakness, abnormal sensation, seizure, frequent headaches, memory loss, confusion.  History of dementia but appears alert and oriented today. Psych: Negative for anxiety, depression, suicidal ideation, hallucinations.  Endo: Negative for unusual weight change.  Heme: Negative for bruising or bleeding. Allergy: Negative for rash or hives.       Physical Examination: Vital signs in last 24 hours: Temp:  [97.4 F (36.3 C)-98.5 F (36.9 C)] 97.9 F (36.6 C) (12/16 0509) Pulse Rate:  [64-86] 86 (12/16 0509) Resp:  [18-19] 19 (12/16 0509) BP: (129-178)/(61-95) 178/95 (12/16 1006) SpO2:  [92 %-97 %] 92 % (12/16 0509) Last BM Date: 05/23/20  General: Well-nourished, well-developed in no acute distress.  Head: Normocephalic, atraumatic.   Eyes: Conjunctiva pink, no icterus. Mouth: masked. Neck: Supple without thyromegaly, masses, or lymphadenopathy.  Lungs: Clear to auscultation bilaterally.  Heart: Regular rate and rhythm, no murmurs rubs or gallops.  Abdomen: Bowel sounds are normal,   nondistended, no hepatosplenomegaly or masses, no abdominal bruits or    hernia , no rebound or guarding.  Moderate left-sided tenderness Rectal: Not performed Extremities: No lower extremity edema, clubbing, deformity.  Neuro: Alert and oriented x 4 , grossly normal neurologically.  Skin: Warm and dry, no rash or jaundice.   Psych: Alert and cooperative, normal mood and affect.        Intake/Output from  previous day: 12/15 0701 - 12/16 0700 In: 570 [P.O.:470; IV Piggyback:100] Out: 1550 [Urine:1550] Intake/Output this shift: No intake/output data recorded.  Lab Results: CBC Recent Labs    05/23/20 0600 05/24/20 0608 05/25/20 0636  WBC 29.7* 22.6* 14.2*  HGB 12.2* 10.8* 10.8*  HCT 38.4* 34.6* 34.8*  MCV 83.3 85.4 84.7  PLT 224 198 212   BMET Recent Labs    05/23/20 0600 05/24/20 0608 05/25/20 0636  NA 137 138 137  K 3.7 3.9 3.4*  CL 106 108 105  CO2 27 24 26   GLUCOSE 141* 118* 109*  BUN 31* 32* 24*  CREATININE 1.06 1.04 0.86  CALCIUM 9.2 9.2 8.9   LFT Recent Labs    05/22/20 1805  BILITOT 0.7  ALKPHOS 66  AST 15  ALT 12  PROT 6.2*  ALBUMIN 3.5    Lipase Recent Labs    05/22/20 1225  LIPASE 18    PT/INR Recent Labs    05/22/20 1805  LABPROT 14.5  INR 1.2      Imaging Studies: CT ABDOMEN PELVIS W CONTRAST  Result Date: 05/22/2020 CLINICAL DATA:  Abdominal pain, fever, rectal bleeding EXAM: CT ABDOMEN AND PELVIS WITH CONTRAST TECHNIQUE: Multidetector CT imaging of the abdomen and pelvis was performed using the standard protocol following bolus administration of intravenous contrast. CONTRAST:  127mL OMNIPAQUE IOHEXOL 300 MG/ML  SOLN COMPARISON:  May 2019 FINDINGS: Lower chest: Bibasilar atelectasis/scarring. Hepatobiliary: Small cyst of the left hepatic lobe. Few small layering gallstones. No biliary dilatation. Pancreas: Unremarkable. Spleen: Unremarkable. Adrenals/Urinary Tract: Adrenal glands are unremarkable. Bilateral renal cysts. 3 mm nonobstructing calculus of the lower pole the right kidney. Small bladder diverticula. Stomach/Bowel: Stomach is within normal limits. Distal colonic diverticulosis. There is wall thickening along the distal  transverse colon, splenic flexure, and descending colon. Vascular/Lymphatic: Aortic atherosclerosis. No enlarged lymph nodes. Reproductive: Unremarkable. Other: Small volume abdominopelvic ascites. No acute  abnormality of the abdominal wall. Musculoskeletal: Right femoral fixation with associated streak artifact. Degenerative changes of the lumbar spine. No acute osseous abnormality. IMPRESSION: Findings consistent with colitis involving the distal transverse colon, splenic flexure, and descending colon. This is likely ischemic or infectious in etiology. Diverticulosis is present but mostly distal to the area of thickening and diverticulitis is therefore less likely. 3 mm nonobstructing left renal calculus. Small bladder diverticula. Electronically Signed   By: Macy Mis M.D.   On: 05/22/2020 20:12   DG Pelvis Portable  Result Date: 05/22/2020 CLINICAL DATA:  Fall, pelvic injury EXAM: PORTABLE PELVIS 1-2 VIEWS COMPARISON:  None. FINDINGS: ORIF of a right subcapital femoral neck fracture has been performed utilizing 4 partially-threaded screws with incomplete healing of the a fracture. No acute fracture or dislocation. Mild bilateral hip joint space narrowing is present in keeping with at least mild bilateral degenerative arthritis. Contrast is seen within the bladder lumen. Small bladder diverticulum present as well as trabeculation of the bladder wall is present in keeping with changes of probable chronic bladder outlet obstruction. Defect involving the bladder base likely represents mass effect from a hypertrophied central prostatic lobe. IMPRESSION: Status post right hip ORIF.  No acute fracture or dislocation. Findings in keeping with changes of chronic bladder outlet obstruction, possibly related to central prostatic hypertrophy. Electronically Signed   By: Fidela Salisbury MD   On: 05/22/2020 23:07   DG Chest Port 1 View  Result Date: 05/22/2020 CLINICAL DATA:  Questionable sepsis - evaluate for abnormality Patient reports dizziness leading to fall and abdominal pain. EXAM: PORTABLE CHEST 1 VIEW COMPARISON:  Radiograph 03/12/2020 FINDINGS: Improved cardiomegaly from prior. Unchanged mediastinal  contours with aortic atherosclerosis and tortuosity. Chronic interstitial coarsening. Streaky atelectasis at the right lung base. No confluent consolidation. No pulmonary edema or large pleural effusion. No pneumothorax. No acute osseous abnormalities are seen. IMPRESSION: 1. Mild right basilar atelectasis. 2. Improved cardiomegaly from prior. Stable aortic atherosclerosis and tortuosity. Electronically Signed   By: Keith Rake M.D.   On: 05/22/2020 18:50  [4 week]   Impression: 86 year old male with multiple comorbidities as outlined above, presenting from the nursing home 3 days ago for acute onset abdominal pain associated with vomiting, diarrhea, blood in the stool.  CT findings most consistent with ischemic versus infectious etiology.  Patient presented with significant leukocytosis 29,000, down to 14,000 today.  C. difficile negative.  GI pathogen panel ordered but not collected.  He has been on antibiotics, IV Rocephin and IV Flagyl.  Patient noted improvement over the last 24- 48 hours until this morning.  He has had some recurrent abdominal pain and noted to have burgundy colored stool today.  Plan: 1. Discussed with nursing staff, collect stool for GI pathogen panel. 2. Recheck H&H this afternoon. 3. Continue current supportive measures, antibiotic therapy. 4. Reassess in the morning.  Cannot exclude possibility of needing endoscopic evaluation based on clinical course.  We would like to thank you for the opportunity to participate in the care of WHITFIELD DULAY.  Laureen Ochs. Bernarda Caffey [facility] [phone] 12/16/20212:05 PM     LOS: 3 days

## 2020-05-26 DIAGNOSIS — K529 Noninfective gastroenteritis and colitis, unspecified: Secondary | ICD-10-CM

## 2020-05-26 LAB — BASIC METABOLIC PANEL
Anion gap: 9 (ref 5–15)
BUN: 14 mg/dL (ref 8–23)
CO2: 25 mmol/L (ref 22–32)
Calcium: 8.8 mg/dL — ABNORMAL LOW (ref 8.9–10.3)
Chloride: 102 mmol/L (ref 98–111)
Creatinine, Ser: 0.8 mg/dL (ref 0.61–1.24)
GFR, Estimated: >60 mL/min (ref >60)
Glucose, Bld: 138 mg/dL — ABNORMAL HIGH (ref 70–99)
Potassium: 3.1 mmol/L — ABNORMAL LOW (ref 3.5–5.1)
Sodium: 136 mmol/L (ref 135–145)

## 2020-05-26 LAB — GASTROINTESTINAL PANEL BY PCR, STOOL (REPLACES STOOL CULTURE)

## 2020-05-26 LAB — CBC
HCT: 38.9 % — ABNORMAL LOW (ref 39.0–52.0)
Hemoglobin: 12.4 g/dL — ABNORMAL LOW (ref 13.0–17.0)
MCH: 26.1 pg (ref 26.0–34.0)
MCHC: 31.9 g/dL (ref 30.0–36.0)
MCV: 81.9 fL (ref 80.0–100.0)
Platelets: 231 10*3/uL (ref 150–400)
RBC: 4.75 MIL/uL (ref 4.22–5.81)
RDW: 14.9 % (ref 11.5–15.5)
WBC: 17.2 10*3/uL — ABNORMAL HIGH (ref 4.0–10.5)
nRBC: 0 % (ref 0.0–0.2)

## 2020-05-26 LAB — MAGNESIUM: Magnesium: 1.8 mg/dL (ref 1.7–2.4)

## 2020-05-26 MED ORDER — METOPROLOL TARTRATE 25 MG PO TABS
25.0000 mg | ORAL_TABLET | Freq: Two times a day (BID) | ORAL | Status: DC
  Administered 2020-05-26 – 2020-06-01 (×12): 25 mg via ORAL
  Filled 2020-05-26 (×12): qty 1

## 2020-05-26 MED ORDER — POTASSIUM CHLORIDE CRYS ER 20 MEQ PO TBCR
40.0000 meq | EXTENDED_RELEASE_TABLET | Freq: Once | ORAL | Status: AC
  Administered 2020-05-26: 18:00:00 40 meq via ORAL
  Filled 2020-05-26: qty 2

## 2020-05-26 NOTE — TOC Progression Note (Addendum)
Transition of Care [facility]) - Progression Note    Patient Details  Name: Jeffrey Sherman MRN: [id number] Date of Birth: 10/12/1929  Transition of Care [facility]) CM/SW Contact  Shade Flood, LCSW Phone Number: 05/26/2020, 10:51 AM  Clinical Narrative:     TOC following. Pt status reviewed with MD in Progression today. MD now anticipating likely dc back to [facility] over the weekend. Updated pt's DIL [name]. She expresses that plan remains for pt to return to [facility] room 156 when stable for dc. She asks that [facility] contact her prior to transfer she is aware. Will ask weekend TOC to follow up on this in the event pt discharges over the weekend.  Pt will need new negative covid test result prior to transfer to [facility]. Updated MD this AM.  Expected Discharge Plan: Long Term Nursing Home Barriers to Discharge: Continued Medical Work up  Expected Discharge Plan and Services Expected Discharge Plan: [place] In-house Referral: Clinical Social Work   Post Acute Care Choice: Resumption of Svcs/PTA Provider Living arrangements for the past 2 months: [place]                                       Social Determinants of Health (SDOH) Interventions    Readmission Risk Interventions Readmission Risk Prevention Plan 05/24/2020  Transportation Screening Complete  Medication Review Press photographer) Complete  Skilled Nursing Facility Complete  Some recent data might be hidden

## 2020-05-26 NOTE — Care Management Important Message (Signed)
Important Message  Patient Details  Name: Jeffrey Sherman MRN: [id number] Date of Birth: April 22, 1930   Medicare Important Message Given:  Yes     Tommy Medal 05/26/2020, 4:00 PM

## 2020-05-26 NOTE — Plan of Care (Signed)

## 2020-05-26 NOTE — Progress Notes (Signed)
PROGRESS NOTE  Jeffrey Sherman YKZ:[id number] DOB: [birth year] DOA: 05/22/2020 PCP: Celene Squibb, MD  Brief History: 86 y.o.malewith medical history significant forprostate cancer, dementia, hypertension, peptic ulcer disease. [facility]Patient's symptoms started12/12/21, with nausea vomiting and diarrhea. There was some bright red blood in his stool. He also reports vomiting.Patient reports abdominal pain was very severe,and all over his abdomen particularly left side of abdomen.  ED Course:, Tachycardic to 116, intermittent tachypnea to 24, blood pressure systolic 939Q to 300P, O2 sats greater than 91% on room air. Potassium 3.4. WBC 29. Abd CT-consistent with colitis involving the distal transverse, splenic flexure and descending colon-infectious versus hischemic etiology. Portable chest x-ray unremarkable. IV cefepime and metronidazole was initially started. Hospitalist to admit for likely infectious colitis.  Cefepime was changed to ceftriaxone.  GI was consulted to assist  Assessment/Plan:   Sepsis secondary to colitis -Patient presented with fever, leukocytosis, tachycardia and tachypnea -Abdominal CT indicated distal transverse splenic flexure and sigmoid colon colitis -Stool for C. difficile found to be negative -GI pathogen panel neg -only one small streak BM with blood 12/16 -Patient is currently on intravenous antibiotics -Continue IV LR -Repeat labs in a.m. -suspect ischemic colitis -continue full liquid -appreciate GI consult>>>CTA abd/pelvis -12/16 CTA abd/pelvis-persistent colitis; tandem stenosis of IMA but patent celiac axis and SMA  Hematochezia/Ischemic Colitis -due to ischemic colitis -no hematochezia on 12/17 -drop in Hb due to dilution  Hypertension -Continued on diltiazem and hydralazine -Lisinopril currently on hold since he was exposed to contrast and  RAS -add metoprolol  Prostate cancer -Continue follow-up with urology -Continue on alfuzosin  Dementiawithout behavioral disturbance -continue Namenda  Hypokalemia -replete -check mag--1.8       Status is: Inpatient  Remains inpatient appropriate because:IV treatments appropriate due to intensity of illness or inability to take PO   Dispo: The patient is from:SNF Anticipated d/c is to:SNF Anticipated d/c date is: 1 day Patient currently is not medically stable to d/c.        Family Communication:NoFamily at bedside  Consultants:none  Code Status: DNR  DVT Prophylaxis: SCDs   Procedures: As Listed in Progress Note Above  Antibiotics: Ceftriaxone 12/14>> Cefepime 12/13>>12/14 metronidazole 12/13>>     Subjective: Patient had 2-3 BMs today-->states his abd pain is improving thereafter.  Denies f/c, cp, sob, n/v/  Objective: Vitals:   05/25/20 1404 05/25/20 2036 05/26/20 0529 05/26/20 1425  BP: (!) 163/81 (!) 199/96 (!) 196/103 (!) 177/86  Pulse: 87 88 77 84  Resp: 18 18 18 18   Temp: 98.7 F (37.1 C) 98.4 F (36.9 C) 97.7 F (36.5 C) 97.6 F (36.4 C)  TempSrc: Oral Oral Oral Oral  SpO2: 90% 94% 93% 95%  Weight:      Height:        Intake/Output Summary (Last 24 hours) at 05/26/2020 1712 Last data filed at 05/26/2020 1300 Gross per 24 hour  Intake 1080 ml  Output 500 ml  Net 580 ml   Weight change:  Exam:   General:  Pt is alert, follows commands appropriately, not in acute distress  HEENT: No icterus, No thrush, No neck mass, [place]/AT  Cardiovascular: RRR, S1/S2, no rubs, no gallops  Respiratory: bibasilar rales. No wheeze  Abdomen: Soft/+BS, LLQ pain/ tender, non distended, no guarding  Extremities: trace LE edema, No lymphangitis, No petechiae, No rashes, no synovitis   Data Reviewed: I have personally reviewed following  labs and imaging  studies Basic Metabolic Panel: Recent Labs  Lab 05/22/20 1805 05/23/20 0600 05/24/20 0608 05/25/20 0636 05/26/20 0607  NA 137 137 138 137 136  K 3.4* 3.7 3.9 3.4* 3.1*  CL 101 106 108 105 102  CO2 28 27 24 26 25   GLUCOSE 159* 141* 118* 109* 138*  BUN 33* 31* 32* 24* 14  CREATININE 1.19 1.06 1.04 0.86 0.80  CALCIUM 9.6 9.2 9.2 8.9 8.8*  MG 1.9  --   --  1.9 1.8   Liver Function Tests: Recent Labs  Lab 05/22/20 1805  AST 15  ALT 12  ALKPHOS 66  BILITOT 0.7  PROT 6.2*  ALBUMIN 3.5   Recent Labs  Lab 05/22/20 1225  LIPASE 18  AMYLASE 58   No results for input(s): AMMONIA in the last 168 hours. Coagulation Profile: Recent Labs  Lab 05/22/20 1805  INR 1.2   CBC: Recent Labs  Lab 05/22/20 0130 05/22/20 1805 05/23/20 0600 05/24/20 0608 05/25/20 0608 05/25/20 0636 05/26/20 0607  WBC 19.4* 29.7* 29.7* 22.6*  --  14.2* 17.2*  NEUTROABS 17.2* 25.1*  --   --   --   --   --   HGB 14.6 14.1 12.2* 10.8* 10.8* 10.8* 12.4*  HCT 43.8 43.4 38.4* 34.6* 36.9* 34.8* 38.9*  MCV 81.6 82.4 83.3 85.4  --  84.7 81.9  PLT 254 265 224 198  --  212 231   Cardiac Enzymes: No results for input(s): CKTOTAL, CKMB, CKMBINDEX, TROPONINI in the last 168 hours. BNP: Invalid input(s): POCBNP CBG: No results for input(s): GLUCAP in the last 168 hours. HbA1C: No results for input(s): HGBA1C in the last 72 hours. Urine analysis:    Component Value Date/Time   COLORURINE YELLOW 05/22/2020 1111   APPEARANCEUR CLEAR 05/22/2020 1111   APPEARANCEUR Clear 02/15/2020 1321   LABSPEC 1.016 05/22/2020 1111   PHURINE 5.0 05/22/2020 1111   GLUCOSEU 150 (A) 05/22/2020 1111   HGBUR NEGATIVE 05/22/2020 1111   BILIRUBINUR NEGATIVE 05/22/2020 1111   BILIRUBINUR Negative 02/15/2020 1321   KETONESUR NEGATIVE 05/22/2020 1111   PROTEINUR >=300 (A) 05/22/2020 1111   UROBILINOGEN negative (A) 10/19/2019 0958   UROBILINOGEN 0.2 05/08/2014 2045   NITRITE NEGATIVE 05/22/2020 1111   LEUKOCYTESUR  NEGATIVE 05/22/2020 1111   Sepsis Labs: @LABRCNTIP (procalcitonin:4,lacticidven:4) ) Recent Results (from the past 240 hour(s))  Culture, blood (routine x 2)     Status: None (Preliminary result)   Collection Time: 05/22/20  7:45 AM   Specimen: BLOOD  Result Value Ref Range Status   Specimen Description BLOOD RIGHT ANTECUBITAL DRAWN BY RN  Final   Special Requests   Final    BOTTLES DRAWN AEROBIC AND ANAEROBIC Blood Culture results may not be optimal due to an excessive volume of blood received in culture bottles   Culture   Final    NO GROWTH 4 DAYS Performed at [facility], [address], [place], [place] [address]    Report Status PENDING  Incomplete  Culture, blood (routine x 2)     Status: None (Preliminary result)   Collection Time: 05/22/20  7:50 AM   Specimen: BLOOD  Result Value Ref Range Status   Specimen Description BLOOD LEFT ANTECUBITAL DRAWN BY RN  Final   Special Requests   Final    BOTTLES DRAWN AEROBIC AND ANAEROBIC Blood Culture results may not be optimal due to an excessive volume of blood received in culture bottles   Culture   Final    NO  GROWTH 4 DAYS Performed at [facility], [address], [place], [place] [address]    Report Status PENDING  Incomplete  Culture, Urine     Status: Abnormal   Collection Time: 05/22/20 11:12 AM   Specimen: Urine, Clean Catch  Result Value Ref Range Status   Specimen Description   Final    URINE, CLEAN CATCH Performed at [facility], [address] [place], [place] [address]    Special Requests   Final    NONE Performed at [facility], [address] [place], [place] [address]    Culture (A)  Final    <10,000 COLONIES/mL INSIGNIFICANT GROWTH Performed at [facility] [address], [place], [place] [address]    Report Status 05/23/2020 FINAL  Final  Blood Culture (routine x 2)     Status: None (Preliminary result)   Collection Time: 05/22/20  6:18 PM   Specimen: BLOOD  Result Value Ref Range Status    Specimen Description BLOOD RIGHT ANTECUBITAL  Final   Special Requests   Final    BOTTLES DRAWN AEROBIC AND ANAEROBIC Blood Culture adequate volume   Culture   Final    NO GROWTH 4 DAYS Performed at [facility], [address], [place], [place] [address]    Report Status PENDING  Incomplete  Blood Culture (routine x 2)     Status: None (Preliminary result)   Collection Time: 05/22/20  6:23 PM   Specimen: BLOOD  Result Value Ref Range Status   Specimen Description BLOOD BLOOD RIGHT WRIST  Final   Special Requests   Final    BOTTLES DRAWN AEROBIC AND ANAEROBIC Blood Culture adequate volume   Culture   Final    NO GROWTH 4 DAYS Performed at [facility], [address] [place], [place] [address]    Report Status PENDING  Incomplete  C Difficile Quick Screen w PCR reflex     Status: None   Collection Time: 05/22/20 11:55 PM   Specimen: STOOL  Result Value Ref Range Status   C Diff antigen NEGATIVE NEGATIVE Final   C Diff toxin NEGATIVE NEGATIVE Final   C Diff interpretation No C. difficile detected.  Final    Comment: Performed at [facility], [address], [place], [place] [address]  Resp Panel by RT-PCR (Flu A&B, Covid) Nasopharyngeal Swab     Status: None   Collection Time: 05/23/20  1:00 AM   Specimen: Nasopharyngeal Swab; Nasopharyngeal(NP) swabs in vial transport medium  Result Value Ref Range Status   SARS Coronavirus 2 by RT PCR NEGATIVE NEGATIVE Final    Comment: (NOTE) SARS-CoV-2 target nucleic acids are NOT DETECTED.  The SARS-CoV-2 RNA is generally detectable in upper respiratory specimens during the acute phase of infection. The lowest concentration of SARS-CoV-2 viral copies this assay can detect is 138 copies/mL. A negative result does not preclude SARS-Cov-2 infection and should not be used as the sole basis for treatment or other patient management decisions. A negative result may occur with  improper specimen collection/handling, submission of specimen  other than nasopharyngeal swab, presence of viral mutation(s) within the areas targeted by this assay, and inadequate number of viral copies(<138 copies/mL). A negative result must be combined with clinical observations, patient history, and epidemiological information. The expected result is Negative.  Fact Sheet for Patients:  EntrepreneurPulse.com.au  Fact Sheet for Healthcare Providers:  IncredibleEmployment.be  This test is no t yet approved or cleared by the [place] FDA and  has been  authorized for detection and/or diagnosis of SARS-CoV-2 by FDA under an Emergency Use Authorization (EUA). This EUA will remain  in effect (meaning this test can be used) for the duration of the COVID-19 declaration under Section 564(b)(1) of the Act, 21 U.S.C.section 360bbb-3(b)(1), unless the authorization is terminated  or revoked sooner.       Influenza A by PCR NEGATIVE NEGATIVE Final   Influenza B by PCR NEGATIVE NEGATIVE Final    Comment: (NOTE) The Xpert Xpress SARS-CoV-2/FLU/RSV plus assay is intended as an aid in the diagnosis of influenza from Nasopharyngeal swab specimens and should not be used as a sole basis for treatment. Nasal washings and aspirates are unacceptable for Xpert Xpress SARS-CoV-2/FLU/RSV testing.  Fact Sheet for Patients: EntrepreneurPulse.com.au  Fact Sheet for Healthcare Providers: IncredibleEmployment.be  This test is not yet approved or cleared by the [place] FDA and has been authorized for detection and/or diagnosis of SARS-CoV-2 by FDA under an Emergency Use Authorization (EUA). This EUA will remain in effect (meaning this test can be used) for the duration of the COVID-19 declaration under Section 564(b)(1) of the Act, 21 U.S.C. section 360bbb-3(b)(1), unless the authorization is terminated or revoked.  Performed at [facility], [address], [place], [place]  [address]   MRSA PCR Screening     Status: None   Collection Time: 05/23/20  2:53 PM   Specimen: Nasal Mucosa; Nasopharyngeal  Result Value Ref Range Status   MRSA by PCR NEGATIVE NEGATIVE Final    Comment:        The GeneXpert MRSA Assay (FDA approved for NASAL specimens only), is one component of a comprehensive MRSA colonization surveillance program. It is not intended to diagnose MRSA infection nor to guide or monitor treatment for MRSA infections. Performed at [facility], [address], [place], [place] [address]   Gastrointestinal Panel by PCR , Stool     Status: None   Collection Time: 05/26/20  1:40 AM   Specimen: Stool  Result Value Ref Range Status   Campylobacter species NOT DETECTED NOT DETECTED Final   Plesimonas shigelloides NOT DETECTED NOT DETECTED Final   Salmonella species NOT DETECTED NOT DETECTED Final   Yersinia enterocolitica NOT DETECTED NOT DETECTED Final   Vibrio species NOT DETECTED NOT DETECTED Final   Vibrio cholerae NOT DETECTED NOT DETECTED Final   Enteroaggregative E coli (EAEC) NOT DETECTED NOT DETECTED Final   Enteropathogenic E coli (EPEC) NOT DETECTED NOT DETECTED Final   Enterotoxigenic E coli (ETEC) NOT DETECTED NOT DETECTED Final   Shiga like toxin producing E coli (STEC) NOT DETECTED NOT DETECTED Final   Shigella/Enteroinvasive E coli (EIEC) NOT DETECTED NOT DETECTED Final   Cryptosporidium NOT DETECTED NOT DETECTED Final   Cyclospora cayetanensis NOT DETECTED NOT DETECTED Final   Entamoeba histolytica NOT DETECTED NOT DETECTED Final   Giardia lamblia NOT DETECTED NOT DETECTED Final   Adenovirus F40/41 NOT DETECTED NOT DETECTED Final   Astrovirus NOT DETECTED NOT DETECTED Final   Norovirus GI/GII NOT DETECTED NOT DETECTED Final   Rotavirus A NOT DETECTED NOT DETECTED Final   Sapovirus (I, II, IV, and V) NOT DETECTED NOT DETECTED Final    Comment: Performed at [facility], [address] [place], [place] [address]      Scheduled Meds: . alfuzosin  10 mg Oral Q breakfast  . diltiazem  360 mg Oral Daily  . hydrALAZINE  100 mg Oral TID  . memantine  10 mg Oral BID  . venlafaxine XR  75 mg Oral Q breakfast   Continuous Infusions: . cefTRIAXone (ROCEPHIN)  IV 2 g (05/26/20 0725)  . lactated ringers 100 mL/hr at 05/26/20 0914  . metronidazole 500 mg (05/26/20 0912)    Procedures/Studies: CT ABDOMEN PELVIS W CONTRAST  Result Date: 05/22/2020 CLINICAL DATA:  Abdominal pain, fever, rectal bleeding EXAM: CT ABDOMEN AND PELVIS WITH CONTRAST TECHNIQUE: Multidetector CT imaging of the abdomen and pelvis was performed using the standard protocol following bolus administration of intravenous contrast. CONTRAST:  175mL OMNIPAQUE IOHEXOL 300 MG/ML  SOLN COMPARISON:  May 2019 FINDINGS: Lower chest: Bibasilar atelectasis/scarring. Hepatobiliary: Small cyst of the left hepatic lobe. Few small layering gallstones. No biliary dilatation. Pancreas: Unremarkable. Spleen: Unremarkable. Adrenals/Urinary Tract: Adrenal glands are unremarkable. Bilateral renal cysts. 3 mm nonobstructing calculus of the lower pole the right kidney. Small bladder diverticula. Stomach/Bowel: Stomach is within normal limits. Distal colonic diverticulosis. There is wall thickening along the distal transverse colon, splenic flexure, and descending colon. Vascular/Lymphatic: Aortic atherosclerosis. No enlarged lymph nodes. Reproductive: Unremarkable. Other: Small volume abdominopelvic ascites. No acute abnormality of the abdominal wall. Musculoskeletal: Right femoral fixation with associated streak artifact. Degenerative changes of the lumbar spine. No acute osseous abnormality. IMPRESSION: Findings consistent with colitis involving the distal transverse colon, splenic flexure, and descending colon. This is likely ischemic or infectious in etiology. Diverticulosis is present but mostly distal to the area of thickening and diverticulitis is therefore less  likely. 3 mm nonobstructing left renal calculus. Small bladder diverticula. Electronically Signed   By: Macy Mis M.D.   On: 05/22/2020 20:12   DG Pelvis Portable  Result Date: 05/22/2020 CLINICAL DATA:  Fall, pelvic injury EXAM: PORTABLE PELVIS 1-2 VIEWS COMPARISON:  None. FINDINGS: ORIF of a right subcapital femoral neck fracture has been performed utilizing 4 partially-threaded screws with incomplete healing of the a fracture. No acute fracture or dislocation. Mild bilateral hip joint space narrowing is present in keeping with at least mild bilateral degenerative arthritis. Contrast is seen within the bladder lumen. Small bladder diverticulum present as well as trabeculation of the bladder wall is present in keeping with changes of probable chronic bladder outlet obstruction. Defect involving the bladder base likely represents mass effect from a hypertrophied central prostatic lobe. IMPRESSION: Status post right hip ORIF.  No acute fracture or dislocation. Findings in keeping with changes of chronic bladder outlet obstruction, possibly related to central prostatic hypertrophy. Electronically Signed   By: Fidela Salisbury MD   On: 05/22/2020 23:07   DG Chest Port 1 View  Result Date: 05/22/2020 CLINICAL DATA:  Questionable sepsis - evaluate for abnormality Patient reports dizziness leading to fall and abdominal pain. EXAM: PORTABLE CHEST 1 VIEW COMPARISON:  Radiograph 03/12/2020 FINDINGS: Improved cardiomegaly from prior. Unchanged mediastinal contours with aortic atherosclerosis and tortuosity. Chronic interstitial coarsening. Streaky atelectasis at the right lung base. No confluent consolidation. No pulmonary edema or large pleural effusion. No pneumothorax. No acute osseous abnormalities are seen. IMPRESSION: 1. Mild right basilar atelectasis. 2. Improved cardiomegaly from prior. Stable aortic atherosclerosis and tortuosity. Electronically Signed   By: Keith Rake M.D.   On: 05/22/2020 18:50    CT Angio Abd/Pel w/ and/or w/o  Result Date: 05/26/2020 CLINICAL DATA:  Left abdominal pain, diarrhea, suspicion of recurrent colitis, rule out mesenteric ischemia EXAM: CTA ABDOMEN AND PELVIS WITH CONTRAST TECHNIQUE: Multidetector CT imaging of the abdomen and pelvis was performed using the standard protocol during bolus administration of intravenous contrast. Multiplanar reconstructed images and MIPs were obtained and reviewed to  evaluate the vascular anatomy. CONTRAST:  114mL OMNIPAQUE IOHEXOL 350 MG/ML SOLN COMPARISON:  05/22/2020 and previous FINDINGS: VASCULAR Aorta: Moderate calcified atheromatous plaque. No aneurysm, dissection, or stenosis. Celiac: Calcified ostial plaque without stenosis. Mild narrowing at the median arcuate ligament of the diaphragm, with minimal poststenotic dilatation, patent distally. SMA: Widely patent.  Minimal plaque.  Classic distal branch anatomy. Renals: Duplicated left, superior dominant with calcified ostial plaque, no stenosis. The inferior left renal artery is aberrant, arising below the level of the IMA origin just above the aortic bifurcation, supplying a portion of the lower pole. Duplicated right, superior dominant with calcified ostial plaque resulting in short segment stenosis of at least mild severity, patent distally. The more diminutive inferior right renal artery is aberrant arising below the level of the IMA, supplying a portion of the lower pole. IMA: Ostial stenosis of possible hemodynamic significance. Tandem heavily calcified lesion approximately 2.5 cm from the origin resulting in stenosis of probable hemodynamic significance. Inflow: Moderate calcified atheromatous plaque about the bifurcations of both common iliac arteries. No aneurysm, dissection, or stenosis. Proximal Outflow: Bilateral common femoral and visualized portions of the superficial and profunda femoral arteries are patent without evidence of aneurysm, dissection, vasculitis or  significant stenosis. Veins: No obvious venous abnormality within the limitations of this arterial phase study. Retroaortic left renal vein, an anatomic variant. Review of the MIP images confirms the above findings. NON-VASCULAR Lower chest: Small bilateral pleural effusions, and dependent atelectasis/consolidation posteriorly in both lung bases, increased since previous. Hepatobiliary: Benign cyst in hepatic for segment 4B, present since 08/15/2010. No new liver lesion or biliary ductal dilatation. Vicarious excretion of contrast material into the gallbladder obscuring the small calculi seen on the prior study. Pancreas: Unremarkable. No pancreatic ductal dilatation or surrounding inflammatory changes. Spleen: Normal in size without focal abnormality. Stable accessory splenule. Adrenals/Urinary Tract: Adrenal glands unremarkable. 6.7 cm cyst from the upper pole left kidney, slowly enlarging since 2012. 5.5 cm exophytic cyst from the mid right kidney, and 3.2 cm cyst from the right lower pole, present since 2012. 5 mm calcification in the lower pole right renal collecting system. 1 mm calcification in the left ureter at the level of the SI joint with stable mild pelvicaliectasis. Stomach/Bowel: Stomach is decompressed. Appendix not discretely identified no pericecal inflammatory/edematous change. The colon is nondilated. There is circumferential wall thickening in the distal transverse colon, splenic flexure, and descending colon. Multiple sigmoid diverticula without abscess. The rectum is nondistended. Lymphatic: Subcentimeter left para-aortic lymph node. No pelvic or mesenteric adenopathy. Reproductive: Mild prostate enlargement. Other: Small volume ascites in the lower abdomen, slightly increased since previous. No free air. Musculoskeletal: Lower lumbar spondylitic changes most marked L4-5. Orthopedic pins across the right femoral neck. No acute fracture or worrisome bone lesion. IMPRESSION: 1. Persistent  colitis involving distal transverse colon, splenic flexure, and descending colon. 2. Mild lower abdominal ascites, slightly increased on a. 3. 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 The celiac axis and superior mesenteric artery remain widely patent however, which typically allows adequate collateral perfusion. 4. Increase in small bilateral pleural effusions and dependent atelectasis/consolidation in both lung bases since previous study. 5. Sigmoid diverticulosis. 6. 1 mm mid left ureteral calculus with stable left renal pelvicaliectasis. 7. Right nephrolithiasis. 8. Bilateral renal artery ostial stenosis of possible hemodynamic significance. Aortic Atherosclerosis (ICD10-I70.0). Electronically Signed   By: Lucrezia Europe M.D.   On: 05/26/2020 08:35    Orson Eva, DO  [facility]  If 7PM-7AM, please contact  night-coverage [email or web address] Password [email or web address] 05/26/2020, 5:12 PM   LOS: 4 days

## 2020-05-26 NOTE — Progress Notes (Signed)
Subjective: Still with abdominal pain, but better today now that he's pooping. Laying in bed awaiting CNA assistance, has had a bowel movement. Stool liquid brown, no obvious melens or hematochezia. Pain is described as sharp. Denies N/V. No feeling up to eating, but is drinking ice water. Discussed CTA results and possible need for colonoscopy pending clinical progress, he understands and seems agreeable. No other overt GI complaints.  Objective: Vital signs in last 24 hours: Temp:  [97.7 F (36.5 C)-98.7 F (37.1 C)] 97.7 F (36.5 C) (12/17 0529) Pulse Rate:  [77-88] 77 (12/17 0529) Resp:  [18] 18 (12/17 0529) BP: (163-199)/(81-103) 196/103 (12/17 0529) SpO2:  [90 %-94 %] 93 % (12/17 0529) Last BM Date: 05/25/20 General:   Alert and oriented, pleasant Head:  Normocephalic and atraumatic. Eyes:  No icterus, sclera clear. Conjuctiva pink.  Neck:  Supple, without thyromegaly or masses.  Heart:  S1, S2 present, no murmurs noted.  Lungs: Clear to auscultation bilaterally, without wheezing, rales, or rhonchi.  Abdomen:  Bowel sounds present, soft, non-distended. Moderate to significant TTP generalized abdomen. No HSM or hernias noted. No rebound or guarding. Msk:  Symmetrical without gross deformities. Normal posture. Pulses:  Normal bilateral pulses noted. Extremities:  Without clubbing or edema. Neurologic:  Alert and  oriented x4;  grossly normal neurologically. Psych:  Alert and cooperative. Normal mood and affect.  Intake/Output from previous day: 12/16 0701 - 12/17 0700 In: 1560 [P.O.:1560] Out: 1150 [Urine:1150] Intake/Output this shift: Total I/O In: 240 [P.O.:240] Out: 250 [Urine:250]  Lab Results: Recent Labs    05/24/20 0608 05/25/20 0608 05/25/20 0636 05/26/20 0607  WBC 22.6*  --  14.2* 17.2*  HGB 10.8* 10.8* 10.8* 12.4*  HCT 34.6* 36.9* 34.8* 38.9*  PLT 198  --  212 231   BMET Recent Labs    05/24/20 0608 05/25/20 0636 05/26/20 0607  NA 138 137 136   K 3.9 3.4* 3.1*  CL 108 105 102  CO2 24 26 25   GLUCOSE 118* 109* 138*  BUN 32* 24* 14  CREATININE 1.04 0.86 0.80  CALCIUM 9.2 8.9 8.8*   LFT No results for input(s): PROT, ALBUMIN, AST, ALT, ALKPHOS, BILITOT, BILIDIR, IBILI in the last 72 hours. PT/INR No results for input(s): LABPROT, INR in the last 72 hours. Hepatitis Panel No results for input(s): HEPBSAG, HCVAB, HEPAIGM, HEPBIGM in the last 72 hours.   Studies/Results: CT Angio Abd/Pel w/ and/or w/o  Result Date: 05/26/2020 CLINICAL DATA:  Left abdominal pain, diarrhea, suspicion of recurrent colitis, rule out mesenteric ischemia EXAM: CTA ABDOMEN AND PELVIS WITH CONTRAST TECHNIQUE: Multidetector CT imaging of the abdomen and pelvis was performed using the standard protocol during bolus administration of intravenous contrast. Multiplanar reconstructed images and MIPs were obtained and reviewed to evaluate the vascular anatomy. CONTRAST:  154mL OMNIPAQUE IOHEXOL 350 MG/ML SOLN COMPARISON:  05/22/2020 and previous FINDINGS: VASCULAR Aorta: Moderate calcified atheromatous plaque. No aneurysm, dissection, or stenosis. Celiac: Calcified ostial plaque without stenosis. Mild narrowing at the median arcuate ligament of the diaphragm, with minimal poststenotic dilatation, patent distally. SMA: Widely patent.  Minimal plaque.  Classic distal branch anatomy. Renals: Duplicated left, superior dominant with calcified ostial plaque, no stenosis. The inferior left renal artery is aberrant, arising below the level of the IMA origin just above the aortic bifurcation, supplying a portion of the lower pole. Duplicated right, superior dominant with calcified ostial plaque resulting in short segment stenosis of at least mild severity, patent distally. The more diminutive inferior right  renal artery is aberrant arising below the level of the IMA, supplying a portion of the lower pole. IMA: Ostial stenosis of possible hemodynamic significance. Tandem heavily  calcified lesion approximately 2.5 cm from the origin resulting in stenosis of probable hemodynamic significance. Inflow: Moderate calcified atheromatous plaque about the bifurcations of both common iliac arteries. No aneurysm, dissection, or stenosis. Proximal Outflow: Bilateral common femoral and visualized portions of the superficial and profunda femoral arteries are patent without evidence of aneurysm, dissection, vasculitis or significant stenosis. Veins: No obvious venous abnormality within the limitations of this arterial phase study. Retroaortic left renal vein, an anatomic variant. Review of the MIP images confirms the above findings. NON-VASCULAR Lower chest: Small bilateral pleural effusions, and dependent atelectasis/consolidation posteriorly in both lung bases, increased since previous. Hepatobiliary: Benign cyst in hepatic for segment 4B, present since 08/15/2010. No new liver lesion or biliary ductal dilatation. Vicarious excretion of contrast material into the gallbladder obscuring the small calculi seen on the prior study. Pancreas: Unremarkable. No pancreatic ductal dilatation or surrounding inflammatory changes. Spleen: Normal in size without focal abnormality. Stable accessory splenule. Adrenals/Urinary Tract: Adrenal glands unremarkable. 6.7 cm cyst from the upper pole left kidney, slowly enlarging since 2012. 5.5 cm exophytic cyst from the mid right kidney, and 3.2 cm cyst from the right lower pole, present since 2012. 5 mm calcification in the lower pole right renal collecting system. 1 mm calcification in the left ureter at the level of the SI joint with stable mild pelvicaliectasis. Stomach/Bowel: Stomach is decompressed. Appendix not discretely identified no pericecal inflammatory/edematous change. The colon is nondilated. There is circumferential wall thickening in the distal transverse colon, splenic flexure, and descending colon. Multiple sigmoid diverticula without abscess. The rectum  is nondistended. Lymphatic: Subcentimeter left para-aortic lymph node. No pelvic or mesenteric adenopathy. Reproductive: Mild prostate enlargement. Other: Small volume ascites in the lower abdomen, slightly increased since previous. No free air. Musculoskeletal: Lower lumbar spondylitic changes most marked L4-5. Orthopedic pins across the right femoral neck. No acute fracture or worrisome bone lesion. IMPRESSION: 1. Persistent colitis involving distal transverse colon, splenic flexure, and descending colon. 2. Mild lower abdominal ascites, slightly increased on a. 3. 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 The celiac axis and superior mesenteric artery remain widely patent however, which typically allows adequate collateral perfusion. 4. Increase in small bilateral pleural effusions and dependent atelectasis/consolidation in both lung bases since previous study. 5. Sigmoid diverticulosis. 6. 1 mm mid left ureteral calculus with stable left renal pelvicaliectasis. 7. Right nephrolithiasis. 8. Bilateral renal artery ostial stenosis of possible hemodynamic significance. Aortic Atherosclerosis (ICD10-I70.0). Electronically Signed   By: Lucrezia Europe M.D.   On: 05/26/2020 08:35    Assessment: 86 year old male with multiple comorbidities as outlined in consult note and in history above, presenting from the nursing home 3 days ago for acute onset abdominal pain associated with vomiting, diarrhea, blood in the stool.  CT findings most consistent with ischemic versus infectious etiology.  Patient presented with significant leukocytosis 29,000, down to 14,000 today.  C. difficile negative.  GI pathogen panel ordered but not collected.  He has been on antibiotics, IV Rocephin and IV Flagyl.  Patient noted improvement over the last 24- 48 hours until this morning.  He has had some recurrent abdominal pain and noted to have burgundy colored stool today.   Abdominal pain with diarrhea and bleeding: CTA was  completed yesterday and noted persistent colitis involving the transverse colon, splenic flexure, and descending colon. Prox  stenosis of IMA, widely patent celiac and SMA and unlikely ischemic colitis. Right nephrolithiasis, bilateral renal artery ostial stenosis of possible hemodynamic significance. Hgb 12.4 today (has been 10.8, baseline normal). GI path panel collected and pending. Leucocytosis persists. Had a stool this morning, no obvious hematochezia or melena. Stool liquid but not watery. Pain improved with stools.   Plan: 1. Follow for GI Path Panel results 2. Follow hgb 3. Monitor for recurrent GI bleed 4. Transfuse as necessary 5. Continue antibiotics 6. Can hold on colonoscopy today with improvement 7. May need colonoscopy next couple days pending clinical progress 8. Supportive measures   Thank you for allowing [place] to participate in the care of Rogers, DNP, AGNP-C Adult & Gerontological Nurse Practitioner [facility]    LOS: 4 days    05/26/2020, 10:04 AM

## 2020-05-27 LAB — CBC
HCT: 36.7 % — ABNORMAL LOW (ref 39.0–52.0)
Hemoglobin: 11.6 g/dL — ABNORMAL LOW (ref 13.0–17.0)
MCH: 26 pg (ref 26.0–34.0)
MCHC: 31.6 g/dL (ref 30.0–36.0)
MCV: 82.3 fL (ref 80.0–100.0)
Platelets: 235 10*3/uL (ref 150–400)
RBC: 4.46 MIL/uL (ref 4.22–5.81)
RDW: 14.8 % (ref 11.5–15.5)
WBC: 13.6 10*3/uL — ABNORMAL HIGH (ref 4.0–10.5)
nRBC: 0.1 % (ref 0.0–0.2)

## 2020-05-27 LAB — BASIC METABOLIC PANEL
Anion gap: 8 (ref 5–15)
BUN: 11 mg/dL (ref 8–23)
CO2: 26 mmol/L (ref 22–32)
Calcium: 8.8 mg/dL — ABNORMAL LOW (ref 8.9–10.3)
Chloride: 104 mmol/L (ref 98–111)
Creatinine, Ser: 0.78 mg/dL (ref 0.61–1.24)
GFR, Estimated: >60 mL/min (ref >60)
Glucose, Bld: 123 mg/dL — ABNORMAL HIGH (ref 70–99)
Potassium: 3 mmol/L — ABNORMAL LOW (ref 3.5–5.1)
Sodium: 138 mmol/L (ref 135–145)

## 2020-05-27 LAB — CULTURE, BLOOD (ROUTINE X 2)
Culture: NO GROWTH
Culture: NO GROWTH
Culture: NO GROWTH
Culture: NO GROWTH
Special Requests: ADEQUATE
Special Requests: ADEQUATE

## 2020-05-27 LAB — MAGNESIUM: Magnesium: 1.8 mg/dL (ref 1.7–2.4)

## 2020-05-27 MED ORDER — POTASSIUM CHLORIDE CRYS ER 20 MEQ PO TBCR
40.0000 meq | EXTENDED_RELEASE_TABLET | Freq: Once | ORAL | Status: AC
  Administered 2020-05-27: 17:00:00 40 meq via ORAL
  Filled 2020-05-27: qty 2

## 2020-05-27 NOTE — Progress Notes (Signed)
PROGRESS NOTE  Jeffrey Sherman SPQ:[id number] DOB: 09-06-1929 DOA: 05/22/2020 PCP: Celene Squibb, MD  Brief History: 86 y.o.malewith medical history significant forprostate cancer, dementia, hypertension, peptic ulcer disease. [facility]Patient's symptoms started12/12/21, with nausea vomiting and diarrhea. There was some bright red blood in his stool. He also reports vomiting.Patient reports abdominal pain was very severe,and all over his abdomen particularly left side of abdomen.  ED Course:, Tachycardic to 116, intermittent tachypnea to 24, blood pressure systolic 333L to 456Y, O2 sats greater than 91% on room air. Potassium 3.4. WBC 29. Abd CT-consistent with colitis involving the distal transverse, splenic flexure and descending colon-infectious versus hischemic etiology. Portable chest x-ray unremarkable. IV cefepime and metronidazole was initially started. Hospitalist to admit for likely infectious colitis.  Cefepime was changed to ceftriaxone.  GI was consulted to assist The patient was placed on bowel rest and diet slowly advanced.  Assessment/Plan:   Sepsis secondary to colitis -Patient presented with fever, leukocytosis, tachycardia and tachypnea -Abdominal CT indicated distal transverse splenic flexure and sigmoid colon colitis -Stool for C. difficile found to be negative -GI pathogen panel neg -only one small streak BM with blood 12/16 -Patient is currently on intravenous antibiotics -Continue IVLR -Repeat labs in a.m. -suspect ischemic colitis -continuefull liquid>>advance to soft diet -appreciate GI consult>>>CTA abd/pelvis -12/16 CTA abd/pelvis-persistent colitis; tandem stenosis of IMA but patent celiac axis and SMA;  Colon is nondilated  Hematochezia/Ischemic Colitis -due to ischemic colitis -no hematochezia on 12/17 -drop in Hb due to  dilution  Hypertension -Continued on diltiazem and hydralazine -Lisinopril currently on hold since he was exposed to contrast and RAS -added metoprolol  Prostate cancer -Continue follow-up with urology -Continue on alfuzosin  Dementiawithout behavioral disturbance -continue Namenda  Hypokalemia -replete -check mag--1.8       Status is: Inpatient  Remains inpatient appropriate because:IV treatments appropriate due to intensity of illness or inability to take PO   Dispo: The patient is from:SNF Anticipated d/c is to:SNF Anticipated d/c date is: 1 day Patient currently is not medically stable to d/c.        Family Communication:daughter-in-law updated bedside 12/18  Consultants:none  Code Status: DNR  DVT Prophylaxis: SCDs   Procedures: As Listed in Progress Note Above  Antibiotics: Ceftriaxone 12/14>> Cefepime 12/13>>12/14 metronidazole 12/13>>       Subjective: Patient denies fevers, chills, headache, chest pain, dyspnea, nausea, vomiting, diarrhea,  dysuria, hematuria, hematochezia, and melena.  Continues to have LLQ abd pain, but is slowly improving   Objective: Vitals:   05/26/20 0529 05/26/20 1425 05/26/20 2025 05/27/20 0345  BP: (!) 196/103 (!) 177/86 (!) 152/84 (!) 146/64  Pulse: 77 84 82 77  Resp: 18 18 18 15   Temp: 97.7 F (36.5 C) 97.6 F (36.4 C) 98.1 F (36.7 C) 97.6 F (36.4 C)  TempSrc: Oral Oral  Oral  SpO2: 93% 95% 96% 92%  Weight:      Height:        Intake/Output Summary (Last 24 hours) at 05/27/2020 1445 Last data filed at 05/27/2020 0345 Gross per 24 hour  Intake 360 ml  Output 1500 ml  Net -1140 ml   Weight change:  Exam:   General:  Pt is alert, follows commands appropriately, not in acute distress  HEENT: No icterus, No thrush, No neck mass, [place]/AT  Cardiovascular: RRR, S1/S2, no rubs, no gallops  Respiratory: bibasilar  rales. No wheeze  Abdomen: Soft/+BS, non tender, non distended, no guarding  Extremities: trace LE edema, No lymphangitis, No petechiae, No rashes, no synovitis   Data Reviewed: I have personally reviewed following labs and imaging studies Basic Metabolic Panel: Recent Labs  Lab 05/22/20 1805 05/23/20 0600 05/24/20 0608 05/25/20 0636 05/26/20 0607 05/27/20 0452  NA 137 137 138 137 136 138  K 3.4* 3.7 3.9 3.4* 3.1* 3.0*  CL 101 106 108 105 102 104  CO2 28 27 24 26 25 26   GLUCOSE 159* 141* 118* 109* 138* 123*  BUN 33* 31* 32* 24* 14 11  CREATININE 1.19 1.06 1.04 0.86 0.80 0.78  CALCIUM 9.6 9.2 9.2 8.9 8.8* 8.8*  MG 1.9  --   --  1.9 1.8 1.8   Liver Function Tests: Recent Labs  Lab 05/22/20 1805  AST 15  ALT 12  ALKPHOS 66  BILITOT 0.7  PROT 6.2*  ALBUMIN 3.5   Recent Labs  Lab 05/22/20 1225  LIPASE 18  AMYLASE 58   No results for input(s): AMMONIA in the last 168 hours. Coagulation Profile: Recent Labs  Lab 05/22/20 1805  INR 1.2   CBC: Recent Labs  Lab 05/22/20 0130 05/22/20 1805 05/23/20 0600 05/24/20 0608 05/25/20 0608 05/25/20 0636 05/26/20 0607 05/27/20 0452  WBC 19.4* 29.7* 29.7* 22.6*  --  14.2* 17.2* 13.6*  NEUTROABS 17.2* 25.1*  --   --   --   --   --   --   HGB 14.6 14.1 12.2* 10.8* 10.8* 10.8* 12.4* 11.6*  HCT 43.8 43.4 38.4* 34.6* 36.9* 34.8* 38.9* 36.7*  MCV 81.6 82.4 83.3 85.4  --  84.7 81.9 82.3  PLT 254 265 224 198  --  212 231 235   Cardiac Enzymes: No results for input(s): CKTOTAL, CKMB, CKMBINDEX, TROPONINI in the last 168 hours. BNP: Invalid input(s): POCBNP CBG: No results for input(s): GLUCAP in the last 168 hours. HbA1C: No results for input(s): HGBA1C in the last 72 hours. Urine analysis:    Component Value Date/Time   COLORURINE YELLOW 05/22/2020 1111   APPEARANCEUR CLEAR 05/22/2020 1111   APPEARANCEUR Clear 02/15/2020 1321   LABSPEC 1.016 05/22/2020 1111   PHURINE 5.0 05/22/2020 1111   GLUCOSEU 150 (A)  05/22/2020 1111   HGBUR NEGATIVE 05/22/2020 1111   BILIRUBINUR NEGATIVE 05/22/2020 1111   BILIRUBINUR Negative 02/15/2020 1321   KETONESUR NEGATIVE 05/22/2020 1111   PROTEINUR >=300 (A) 05/22/2020 1111   UROBILINOGEN negative (A) 10/19/2019 0958   UROBILINOGEN 0.2 05/08/2014 2045   NITRITE NEGATIVE 05/22/2020 1111   LEUKOCYTESUR NEGATIVE 05/22/2020 1111   Sepsis Labs: @LABRCNTIP (procalcitonin:4,lacticidven:4) ) Recent Results (from the past 240 hour(s))  Culture, blood (routine x 2)     Status: None   Collection Time: 05/22/20  7:45 AM   Specimen: BLOOD  Result Value Ref Range Status   Specimen Description BLOOD RIGHT ANTECUBITAL DRAWN BY RN  Final   Special Requests   Final    BOTTLES DRAWN AEROBIC AND ANAEROBIC Blood Culture results may not be optimal due to an excessive volume of blood received in culture bottles   Culture   Final    NO GROWTH 5 DAYS Performed at [facility], [address] [place], [place] [address]    Report Status 05/27/2020 FINAL  Final  Culture, blood (routine x 2)     Status: None   Collection Time: 05/22/20  7:50 AM   Specimen: BLOOD  Result Value Ref Range Status   Specimen Description BLOOD LEFT ANTECUBITAL DRAWN  BY RN  Final   Special Requests   Final    BOTTLES DRAWN AEROBIC AND ANAEROBIC Blood Culture results may not be optimal due to an excessive volume of blood received in culture bottles   Culture   Final    NO GROWTH 5 DAYS Performed at [facility], [address], [place], [place] [address]    Report Status 05/27/2020 FINAL  Final  Culture, Urine     Status: Abnormal   Collection Time: 05/22/20 11:12 AM   Specimen: Urine, Clean Catch  Result Value Ref Range Status   Specimen Description   Final    URINE, CLEAN CATCH Performed at [facility], [address] [place], [place] [address]    Special Requests   Final    NONE Performed at [facility], [address] [place], [place] [address]    Culture (A)  Final    <10,000  COLONIES/mL INSIGNIFICANT GROWTH Performed at [place] [address], [place], [place] [address]    Report Status 05/23/2020 FINAL  Final  Blood Culture (routine x 2)     Status: None   Collection Time: 05/22/20  6:18 PM   Specimen: BLOOD  Result Value Ref Range Status   Specimen Description BLOOD RIGHT ANTECUBITAL  Final   Special Requests   Final    BOTTLES DRAWN AEROBIC AND ANAEROBIC Blood Culture adequate volume   Culture   Final    NO GROWTH 5 DAYS Performed at [facility], [address], [place], [place] [address]    Report Status 05/27/2020 FINAL  Final  Blood Culture (routine x 2)     Status: None   Collection Time: 05/22/20  6:23 PM   Specimen: BLOOD  Result Value Ref Range Status   Specimen Description BLOOD BLOOD RIGHT WRIST  Final   Special Requests   Final    BOTTLES DRAWN AEROBIC AND ANAEROBIC Blood Culture adequate volume   Culture   Final    NO GROWTH 5 DAYS Performed at [facility], [address] [place], [place] [address]    Report Status 05/27/2020 FINAL  Final  C Difficile Quick Screen w PCR reflex     Status: None   Collection Time: 05/22/20 11:55 PM   Specimen: STOOL  Result Value Ref Range Status   C Diff antigen NEGATIVE NEGATIVE Final   C Diff toxin NEGATIVE NEGATIVE Final   C Diff interpretation No C. difficile detected.  Final    Comment: Performed at [facility], [address], [place], [place] [address]  Resp Panel by RT-PCR (Flu A&B, Covid) Nasopharyngeal Swab     Status: None   Collection Time: 05/23/20  1:00 AM   Specimen: Nasopharyngeal Swab; Nasopharyngeal(NP) swabs in vial transport medium  Result Value Ref Range Status   SARS Coronavirus 2 by RT PCR NEGATIVE NEGATIVE Final    Comment: (NOTE) SARS-CoV-2 target nucleic acids are NOT DETECTED.  The SARS-CoV-2 RNA is generally detectable in upper respiratory specimens during the acute phase of infection. The lowest concentration of SARS-CoV-2 viral copies this assay can  detect is 138 copies/mL. A negative result does not preclude SARS-Cov-2 infection and should not be used as the sole basis for treatment or other patient management decisions. A negative result may occur with  improper specimen collection/handling, submission of specimen other than nasopharyngeal swab, presence of viral mutation(s) within the areas targeted by this assay, and inadequate number of viral copies(<138 copies/mL). A negative result must be combined with  clinical observations, patient history, and epidemiological information. The expected result is Negative.  Fact Sheet for Patients:  EntrepreneurPulse.com.au  Fact Sheet for Healthcare Providers:  IncredibleEmployment.be  This test is no t yet approved or cleared by the [place] FDA and  has been authorized for detection and/or diagnosis of SARS-CoV-2 by FDA under an Emergency Use Authorization (EUA). This EUA will remain  in effect (meaning this test can be used) for the duration of the COVID-19 declaration under Section 564(b)(1) of the Act, 21 U.S.C.section 360bbb-3(b)(1), unless the authorization is terminated  or revoked sooner.       Influenza A by PCR NEGATIVE NEGATIVE Final   Influenza B by PCR NEGATIVE NEGATIVE Final    Comment: (NOTE) The Xpert Xpress SARS-CoV-2/FLU/RSV plus assay is intended as an aid in the diagnosis of influenza from Nasopharyngeal swab specimens and should not be used as a sole basis for treatment. Nasal washings and aspirates are unacceptable for Xpert Xpress SARS-CoV-2/FLU/RSV testing.  Fact Sheet for Patients: EntrepreneurPulse.com.au  Fact Sheet for Healthcare Providers: IncredibleEmployment.be  This test is not yet approved or cleared by the [place] FDA and has been authorized for detection and/or diagnosis of SARS-CoV-2 by FDA under an Emergency Use Authorization (EUA). This EUA will remain in  effect (meaning this test can be used) for the duration of the COVID-19 declaration under Section 564(b)(1) of the Act, 21 U.S.C. section 360bbb-3(b)(1), unless the authorization is terminated or revoked.  Performed at [facility], [address], [place], [place] [address]   MRSA PCR Screening     Status: None   Collection Time: 05/23/20  2:53 PM   Specimen: Nasal Mucosa; Nasopharyngeal  Result Value Ref Range Status   MRSA by PCR NEGATIVE NEGATIVE Final    Comment:        The GeneXpert MRSA Assay (FDA approved for NASAL specimens only), is one component of a comprehensive MRSA colonization surveillance program. It is not intended to diagnose MRSA infection nor to guide or monitor treatment for MRSA infections. Performed at [facility], [address] [place], [place] [address]   Gastrointestinal Panel by PCR , Stool     Status: None   Collection Time: 05/26/20  1:40 AM   Specimen: Stool  Result Value Ref Range Status   Campylobacter species NOT DETECTED NOT DETECTED Final   Plesimonas shigelloides NOT DETECTED NOT DETECTED Final   Salmonella species NOT DETECTED NOT DETECTED Final   Yersinia enterocolitica NOT DETECTED NOT DETECTED Final   Vibrio species NOT DETECTED NOT DETECTED Final   Vibrio cholerae NOT DETECTED NOT DETECTED Final   Enteroaggregative E coli (EAEC) NOT DETECTED NOT DETECTED Final   Enteropathogenic E coli (EPEC) NOT DETECTED NOT DETECTED Final   Enterotoxigenic E coli (ETEC) NOT DETECTED NOT DETECTED Final   Shiga like toxin producing E coli (STEC) NOT DETECTED NOT DETECTED Final   Shigella/Enteroinvasive E coli (EIEC) NOT DETECTED NOT DETECTED Final   Cryptosporidium NOT DETECTED NOT DETECTED Final   Cyclospora cayetanensis NOT DETECTED NOT DETECTED Final   Entamoeba histolytica NOT DETECTED NOT DETECTED Final   Giardia lamblia NOT DETECTED NOT DETECTED Final   Adenovirus F40/41 NOT DETECTED NOT DETECTED Final   Astrovirus NOT DETECTED NOT DETECTED  Final   Norovirus GI/GII NOT DETECTED NOT DETECTED Final   Rotavirus A NOT DETECTED NOT DETECTED Final   Sapovirus (I, II, IV, and V) NOT DETECTED NOT DETECTED Final    Comment: Performed at [facility], [address] [place], [place] [address]     Scheduled Meds: . alfuzosin  10 mg Oral Q breakfast  . diltiazem  360 mg Oral Daily  . hydrALAZINE  100 mg Oral TID  . memantine  10 mg Oral BID  . metoprolol tartrate  25 mg Oral BID  . venlafaxine XR  75 mg Oral Q breakfast   Continuous Infusions: . cefTRIAXone (ROCEPHIN)  IV 2 g (05/27/20 6433)  . lactated ringers 100 mL/hr at 05/26/20 2222  . metronidazole 500 mg (05/27/20 1217)    Procedures/Studies: CT ABDOMEN PELVIS W CONTRAST  Result Date: 05/22/2020 CLINICAL DATA:  Abdominal pain, fever, rectal bleeding EXAM: CT ABDOMEN AND PELVIS WITH CONTRAST TECHNIQUE: Multidetector CT imaging of the abdomen and pelvis was performed using the standard protocol following bolus administration of intravenous contrast. CONTRAST:  181mL OMNIPAQUE IOHEXOL 300 MG/ML  SOLN COMPARISON:  May 2019 FINDINGS: Lower chest: Bibasilar atelectasis/scarring. Hepatobiliary: Small cyst of the left hepatic lobe. Few small layering gallstones. No biliary dilatation. Pancreas: Unremarkable. Spleen: Unremarkable. Adrenals/Urinary Tract: Adrenal glands are unremarkable. Bilateral renal cysts. 3 mm nonobstructing calculus of the lower pole the right kidney. Small bladder diverticula. Stomach/Bowel: Stomach is within normal limits. Distal colonic diverticulosis. There is wall thickening along the distal transverse colon, splenic flexure, and descending colon. Vascular/Lymphatic: Aortic atherosclerosis. No enlarged lymph nodes. Reproductive: Unremarkable. Other: Small volume abdominopelvic ascites. No acute abnormality of the abdominal wall. Musculoskeletal: Right femoral fixation with associated streak artifact. Degenerative changes of the lumbar spine. No acute  osseous abnormality. IMPRESSION: Findings consistent with colitis involving the distal transverse colon, splenic flexure, and descending colon. This is likely ischemic or infectious in etiology. Diverticulosis is present but mostly distal to the area of thickening and diverticulitis is therefore less likely. 3 mm nonobstructing left renal calculus. Small bladder diverticula. Electronically Signed   By: Macy Mis M.D.   On: 05/22/2020 20:12   DG Pelvis Portable  Result Date: 05/22/2020 CLINICAL DATA:  Fall, pelvic injury EXAM: PORTABLE PELVIS 1-2 VIEWS COMPARISON:  None. FINDINGS: ORIF of a right subcapital femoral neck fracture has been performed utilizing 4 partially-threaded screws with incomplete healing of the a fracture. No acute fracture or dislocation. Mild bilateral hip joint space narrowing is present in keeping with at least mild bilateral degenerative arthritis. Contrast is seen within the bladder lumen. Small bladder diverticulum present as well as trabeculation of the bladder wall is present in keeping with changes of probable chronic bladder outlet obstruction. Defect involving the bladder base likely represents mass effect from a hypertrophied central prostatic lobe. IMPRESSION: Status post right hip ORIF.  No acute fracture or dislocation. Findings in keeping with changes of chronic bladder outlet obstruction, possibly related to central prostatic hypertrophy. Electronically Signed   By: Fidela Salisbury MD   On: 05/22/2020 23:07   DG Chest Port 1 View  Result Date: 05/22/2020 CLINICAL DATA:  Questionable sepsis - evaluate for abnormality Patient reports dizziness leading to fall and abdominal pain. EXAM: PORTABLE CHEST 1 VIEW COMPARISON:  Radiograph 03/12/2020 FINDINGS: Improved cardiomegaly from prior. Unchanged mediastinal contours with aortic atherosclerosis and tortuosity. Chronic interstitial coarsening. Streaky atelectasis at the right lung base. No confluent consolidation. No  pulmonary edema or large pleural effusion. No pneumothorax. No acute osseous abnormalities are seen. IMPRESSION: 1. Mild right basilar atelectasis. 2. Improved cardiomegaly from prior. Stable aortic atherosclerosis and tortuosity. Electronically Signed   By: Keith Rake M.D.   On: 05/22/2020 18:50   CT Angio Abd/Pel w/ and/or w/o  Result Date: 05/26/2020 CLINICAL DATA:  Left abdominal pain, diarrhea, suspicion of recurrent colitis, rule out mesenteric ischemia EXAM: CTA ABDOMEN AND PELVIS WITH CONTRAST TECHNIQUE: Multidetector CT imaging of the abdomen and pelvis was performed using the standard protocol during bolus administration of intravenous contrast. Multiplanar reconstructed images and MIPs were obtained and reviewed to evaluate the vascular anatomy. CONTRAST:  16mL OMNIPAQUE IOHEXOL 350 MG/ML SOLN COMPARISON:  05/22/2020 and previous FINDINGS: VASCULAR Aorta: Moderate calcified atheromatous plaque. No aneurysm, dissection, or stenosis. Celiac: Calcified ostial plaque without stenosis. Mild narrowing at the median arcuate ligament of the diaphragm, with minimal poststenotic dilatation, patent distally. SMA: Widely patent.  Minimal plaque.  Classic distal branch anatomy. Renals: Duplicated left, superior dominant with calcified ostial plaque, no stenosis. The inferior left renal artery is aberrant, arising below the level of the IMA origin just above the aortic bifurcation, supplying a portion of the lower pole. Duplicated right, superior dominant with calcified ostial plaque resulting in short segment stenosis of at least mild severity, patent distally. The more diminutive inferior right renal artery is aberrant arising below the level of the IMA, supplying a portion of the lower pole. IMA: Ostial stenosis of possible hemodynamic significance. Tandem heavily calcified lesion approximately 2.5 cm from the origin resulting in stenosis of probable hemodynamic significance. Inflow: Moderate calcified  atheromatous plaque about the bifurcations of both common iliac arteries. No aneurysm, dissection, or stenosis. Proximal Outflow: Bilateral common femoral and visualized portions of the superficial and profunda femoral arteries are patent without evidence of aneurysm, dissection, vasculitis or significant stenosis. Veins: No obvious venous abnormality within the limitations of this arterial phase study. Retroaortic left renal vein, an anatomic variant. Review of the MIP images confirms the above findings. NON-VASCULAR Lower chest: Small bilateral pleural effusions, and dependent atelectasis/consolidation posteriorly in both lung bases, increased since previous. Hepatobiliary: Benign cyst in hepatic for segment 4B, present since 08/15/2010. No new liver lesion or biliary ductal dilatation. Vicarious excretion of contrast material into the gallbladder obscuring the small calculi seen on the prior study. Pancreas: Unremarkable. No pancreatic ductal dilatation or surrounding inflammatory changes. Spleen: Normal in size without focal abnormality. Stable accessory splenule. Adrenals/Urinary Tract: Adrenal glands unremarkable. 6.7 cm cyst from the upper pole left kidney, slowly enlarging since 2012. 5.5 cm exophytic cyst from the mid right kidney, and 3.2 cm cyst from the right lower pole, present since 2012. 5 mm calcification in the lower pole right renal collecting system. 1 mm calcification in the left ureter at the level of the SI joint with stable mild pelvicaliectasis. Stomach/Bowel: Stomach is decompressed. Appendix not discretely identified no pericecal inflammatory/edematous change. The colon is nondilated. There is circumferential wall thickening in the distal transverse colon, splenic flexure, and descending colon. Multiple sigmoid diverticula without abscess. The rectum is nondistended. Lymphatic: Subcentimeter left para-aortic lymph node. No pelvic or mesenteric adenopathy. Reproductive: Mild prostate  enlargement. Other: Small volume ascites in the lower abdomen, slightly increased since previous. No free air. Musculoskeletal: Lower lumbar spondylitic changes most marked L4-5. Orthopedic pins across the right femoral neck. No acute fracture or worrisome bone lesion. IMPRESSION: 1. Persistent colitis involving distal transverse colon, splenic flexure, and descending colon. 2. Mild lower abdominal ascites, slightly increased on a. 3. 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 The celiac axis and superior mesenteric artery remain widely patent however, which typically allows adequate collateral perfusion. 4. Increase in small bilateral pleural effusions and dependent atelectasis/consolidation in both lung bases since previous study. 5. Sigmoid diverticulosis.  6. 1 mm mid left ureteral calculus with stable left renal pelvicaliectasis. 7. Right nephrolithiasis. 8. Bilateral renal artery ostial stenosis of possible hemodynamic significance. Aortic Atherosclerosis (ICD10-I70.0). Electronically Signed   By: Lucrezia Europe M.D.   On: 05/26/2020 08:35    Orson Eva, DO  [facility]  If 7PM-7AM, please contact night-coverage [email or web address] Password [email or web address] 05/27/2020, 2:45 PM   LOS: 5 days

## 2020-05-27 NOTE — Progress Notes (Signed)
Subjective: Patient reports feeling better today.  Tolerating full liquid diet without any issues.  There is no one loose bowel movement today.  No melena hematochezia.  Still having some left lower quadrant discomfort but not severe.  Objective: Vital signs in last 24 hours: Temp:  [97.6 F (36.4 C)-98.1 F (36.7 C)] 97.6 F (36.4 C) (12/18 0345) Pulse Rate:  [77-84] 77 (12/18 0345) Resp:  [15-18] 15 (12/18 0345) BP: (146-177)/(64-86) 146/64 (12/18 0345) SpO2:  [92 %-96 %] 92 % (12/18 0345) Last BM Date: 05/27/20 General:   Alert and oriented, pleasant Head:  Normocephalic and atraumatic. Eyes:  No icterus, sclera clear. Conjuctiva pink.  Mouth:  Without lesions, mucosa pink and moist.  Neck:  Supple, without thyromegaly or masses.  Heart:  S1, S2 present, no murmurs noted.  Lungs: Clear to auscultation bilaterally, without wheezing, rales, or rhonchi.  Abdomen:  Bowel sounds present, soft, non-tender, non-distended. No HSM or hernias noted. No rebound or guarding. No masses appreciated  Msk:  Symmetrical without gross deformities. Normal posture. Pulses:  Normal pulses noted. Extremities:  Without clubbing or edema. Neurologic:  Alert and  oriented x4;  grossly normal neurologically. Skin:  Warm and dry, intact without significant lesions.  Cervical Nodes:  No significant cervical adenopathy. Psych:  Alert and cooperative. Normal mood and affect.  Intake/Output from previous day: 12/17 0701 - 12/18 0700 In: 840 [P.O.:840] Out: 1750 [Urine:1750] Intake/Output this shift: No intake/output data recorded.  Lab Results: Recent Labs    05/25/20 0636 05/26/20 0607 05/27/20 0452  WBC 14.2* 17.2* 13.6*  HGB 10.8* 12.4* 11.6*  HCT 34.8* 38.9* 36.7*  PLT 212 231 235   BMET Recent Labs    05/25/20 0636 05/26/20 0607 05/27/20 0452  NA 137 136 138  K 3.4* 3.1* 3.0*  CL 105 102 104  CO2 26 25 26   GLUCOSE 109* 138* 123*  BUN 24* 14 11  CREATININE 0.86 0.80 0.78  CALCIUM  8.9 8.8* 8.8*   LFT No results for input(s): PROT, ALBUMIN, AST, ALT, ALKPHOS, BILITOT, BILIDIR, IBILI in the last 72 hours. PT/INR No results for input(s): LABPROT, INR in the last 72 hours. Hepatitis Panel No results for input(s): HEPBSAG, HCVAB, HEPAIGM, HEPBIGM in the last 72 hours.   Studies/Results: CT Angio Abd/Pel w/ and/or w/o  Result Date: 05/26/2020 CLINICAL DATA:  Left abdominal pain, diarrhea, suspicion of recurrent colitis, rule out mesenteric ischemia EXAM: CTA ABDOMEN AND PELVIS WITH CONTRAST TECHNIQUE: Multidetector CT imaging of the abdomen and pelvis was performed using the standard protocol during bolus administration of intravenous contrast. Multiplanar reconstructed images and MIPs were obtained and reviewed to evaluate the vascular anatomy. CONTRAST:  153mL OMNIPAQUE IOHEXOL 350 MG/ML SOLN COMPARISON:  05/22/2020 and previous FINDINGS: VASCULAR Aorta: Moderate calcified atheromatous plaque. No aneurysm, dissection, or stenosis. Celiac: Calcified ostial plaque without stenosis. Mild narrowing at the median arcuate ligament of the diaphragm, with minimal poststenotic dilatation, patent distally. SMA: Widely patent.  Minimal plaque.  Classic distal branch anatomy. Renals: Duplicated left, superior dominant with calcified ostial plaque, no stenosis. The inferior left renal artery is aberrant, arising below the level of the IMA origin just above the aortic bifurcation, supplying a portion of the lower pole. Duplicated right, superior dominant with calcified ostial plaque resulting in short segment stenosis of at least mild severity, patent distally. The more diminutive inferior right renal artery is aberrant arising below the level of the IMA, supplying a portion of the lower pole. IMA: Ostial stenosis of possible  hemodynamic significance. Tandem heavily calcified lesion approximately 2.5 cm from the origin resulting in stenosis of probable hemodynamic significance. Inflow: Moderate  calcified atheromatous plaque about the bifurcations of both common iliac arteries. No aneurysm, dissection, or stenosis. Proximal Outflow: Bilateral common femoral and visualized portions of the superficial and profunda femoral arteries are patent without evidence of aneurysm, dissection, vasculitis or significant stenosis. Veins: No obvious venous abnormality within the limitations of this arterial phase study. Retroaortic left renal vein, an anatomic variant. Review of the MIP images confirms the above findings. NON-VASCULAR Lower chest: Small bilateral pleural effusions, and dependent atelectasis/consolidation posteriorly in both lung bases, increased since previous. Hepatobiliary: Benign cyst in hepatic for segment 4B, present since 08/15/2010. No new liver lesion or biliary ductal dilatation. Vicarious excretion of contrast material into the gallbladder obscuring the small calculi seen on the prior study. Pancreas: Unremarkable. No pancreatic ductal dilatation or surrounding inflammatory changes. Spleen: Normal in size without focal abnormality. Stable accessory splenule. Adrenals/Urinary Tract: Adrenal glands unremarkable. 6.7 cm cyst from the upper pole left kidney, slowly enlarging since 2012. 5.5 cm exophytic cyst from the mid right kidney, and 3.2 cm cyst from the right lower pole, present since 2012. 5 mm calcification in the lower pole right renal collecting system. 1 mm calcification in the left ureter at the level of the SI joint with stable mild pelvicaliectasis. Stomach/Bowel: Stomach is decompressed. Appendix not discretely identified no pericecal inflammatory/edematous change. The colon is nondilated. There is circumferential wall thickening in the distal transverse colon, splenic flexure, and descending colon. Multiple sigmoid diverticula without abscess. The rectum is nondistended. Lymphatic: Subcentimeter left para-aortic lymph node. No pelvic or mesenteric adenopathy. Reproductive: Mild  prostate enlargement. Other: Small volume ascites in the lower abdomen, slightly increased since previous. No free air. Musculoskeletal: Lower lumbar spondylitic changes most marked L4-5. Orthopedic pins across the right femoral neck. No acute fracture or worrisome bone lesion. IMPRESSION: 1. Persistent colitis involving distal transverse colon, splenic flexure, and descending colon. 2. Mild lower abdominal ascites, slightly increased on a. 3. 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 The celiac axis and superior mesenteric artery remain widely patent however, which typically allows adequate collateral perfusion. 4. Increase in small bilateral pleural effusions and dependent atelectasis/consolidation in both lung bases since previous study. 5. Sigmoid diverticulosis. 6. 1 mm mid left ureteral calculus with stable left renal pelvicaliectasis. 7. Right nephrolithiasis. 8. Bilateral renal artery ostial stenosis of possible hemodynamic significance. Aortic Atherosclerosis (ICD10-I70.0). Electronically Signed   By: Lucrezia Europe M.D.   On: 05/26/2020 08:35    Assessment: *Left-sided colitis-ischemic versus infectious *Left lower quadrant abdominal pain *Diarrhea  Plan: Patient continues to improve clinically.  Tolerating full liquid diet without any issues.  Still having some mild left lower quadrant discomfort.  One loose stool this a.m. C. difficile negative, GI panel PCR negative. Okay to DC abx. Continue supportive measures.  Advance diet as tolerated.  We will continue to hold off on colonoscopy for now as patient continues to improve.  He may benefit from outpatient colonoscopy in 4 to 6 weeks.  GI to continue to follow.  Elon Alas. Abbey Chatters, D.O. Gastroenterology and Hepatology [facility]   LOS: 5 days    05/27/2020, 11:05 AM

## 2020-05-28 LAB — BASIC METABOLIC PANEL
Anion gap: 6 (ref 5–15)
BUN: 10 mg/dL (ref 8–23)
CO2: 28 mmol/L (ref 22–32)
Calcium: 8.7 mg/dL — ABNORMAL LOW (ref 8.9–10.3)
Chloride: 103 mmol/L (ref 98–111)
Creatinine, Ser: 0.82 mg/dL (ref 0.61–1.24)
GFR, Estimated: >60 mL/min (ref >60)
Glucose, Bld: 132 mg/dL — ABNORMAL HIGH (ref 70–99)
Potassium: 3 mmol/L — ABNORMAL LOW (ref 3.5–5.1)
Sodium: 137 mmol/L (ref 135–145)

## 2020-05-28 LAB — CBC
HCT: 36.8 % — ABNORMAL LOW (ref 39.0–52.0)
Hemoglobin: 11.8 g/dL — ABNORMAL LOW (ref 13.0–17.0)
MCH: 26.2 pg (ref 26.0–34.0)
MCHC: 32.1 g/dL (ref 30.0–36.0)
MCV: 81.8 fL (ref 80.0–100.0)
Platelets: 227 10*3/uL (ref 150–400)
RBC: 4.5 MIL/uL (ref 4.22–5.81)
RDW: 15.1 % (ref 11.5–15.5)
WBC: 10.9 10*3/uL — ABNORMAL HIGH (ref 4.0–10.5)
nRBC: 0.3 % — ABNORMAL HIGH (ref 0.0–0.2)

## 2020-05-28 MED ORDER — FUROSEMIDE 10 MG/ML IJ SOLN
20.0000 mg | Freq: Once | INTRAMUSCULAR | Status: AC
  Administered 2020-05-28: 18:00:00 20 mg via INTRAVENOUS
  Filled 2020-05-28: qty 2

## 2020-05-28 MED ORDER — POTASSIUM CHLORIDE CRYS ER 20 MEQ PO TBCR
40.0000 meq | EXTENDED_RELEASE_TABLET | Freq: Once | ORAL | Status: AC
  Administered 2020-05-28: 18:00:00 40 meq via ORAL
  Filled 2020-05-28: qty 2

## 2020-05-28 NOTE — Progress Notes (Signed)
PROGRESS NOTE  Jeffrey Sherman EXB:[id number] DOB: 1929/07/04 DOA: 05/22/2020 PCP: Celene Squibb, MD   Brief History: 86 y.o.malewith medical history significant forprostate cancer, dementia, hypertension, peptic ulcer disease. [facility]Patient's symptoms started12/12/21, with nausea vomiting and diarrhea. There was some bright red blood in his stool. He also reports vomiting.Patient reports abdominal pain was very severe,and all over his abdomen particularly left side of abdomen.  ED Course:, Tachycardic to 116, intermittent tachypnea to 24, blood pressure systolic 102V to 253G, O2 sats greater than 91% on room air. Potassium 3.4. WBC 29. Abd CT-consistent with colitis involving the distal transverse, splenic flexure and descending colon-infectious versus hischemic etiology. Portable chest x-ray unremarkable. IV cefepime and metronidazolewas initially started. Hospitalist to admit for likely infectious colitis.Cefepime was changed to ceftriaxone. GI was consulted to assist The patient was placed on bowel rest and diet slowly advanced.  Assessment/Plan:   Sepsis secondary to colitis -Patient presented with fever, leukocytosis, tachycardia and tachypnea -Abdominal CT indicated distal transverse splenic flexure and sigmoid colon colitis -Stool for C. difficile found to be negative -GI pathogen panel neg -only one small streak BM with blood 12/16 -Patient is currently on intravenous antibiotics -Repeat labs in a.m. -suspect ischemic colitis -continuefull liquid>>advance to soft diet -appreciate GI consult>>>CTA abd/pelvis -12/16 CTA abd/pelvis-persistent colitis; tandem stenosis of IMA but patent celiac axis and SMA;  Colon is nondilated  Hematochezia/Ischemic Colitis -due to ischemic colitis -no hematochezia on 12/17 -drop in Hb due to dilution -discussed with GI, Dr. Almira Bar  sig on 12/20 as pt continues to have abd pain with "eleven" BMs since yesterday  Pleural Effusions -lasix IV x 1 -stable on RA -due to third-spacing  Hypertension -Continued on diltiazem and hydralazine -Lisinopril currently on hold since he was exposed to contrastand RAS -added metoprolol  Prostate cancer -Continue follow-up with urology -Continue on alfuzosin  Dementiawithout behavioral disturbance -continue Namenda  Hypokalemia -replete -check mag--1.8       Status is: Inpatient  Remains inpatient appropriate because:IV treatments appropriate due to intensity of illness or inability to take PO   Dispo: The patient is from:SNF Anticipated d/c is to:SNF Anticipated d/c date is: 1 day Patient currently is not medically stable to d/c.        Family Communication:daughter-in-law updated bedside 12/18  Consultants:none  Code Status: DNR  DVT Prophylaxis: SCDs   Procedures: As Listed in Progress Note Above  Antibiotics: Ceftriaxone 12/14>> Cefepime 12/13>>12/14 metronidazole 12/13>>      Subjective: Patient denies fevers, chills, headache, chest pain, dyspnea, nausea, vomiting,  dysuria, hematuria, hematochezia, and melena. He continues to have RLQ/LLQ pain intermittently, moderate.  Claims he had eleven BMs  Objective: Vitals:   05/27/20 1545 05/27/20 2003 05/28/20 0407 05/28/20 1411  BP: (!) 144/72 (!) 173/89 (!) 183/85 (!) 155/73  Pulse: 70 89 95   Resp: 20 18 18 20   Temp: 98.7 F (37.1 C) (!) 97.5 F (36.4 C) (!) 97.3 F (36.3 C) 99 F (37.2 C)  TempSrc: Oral Oral Oral Oral  SpO2: 95% 94% 95% 94%  Weight:      Height:        Intake/Output Summary (Last 24 hours) at 05/28/2020 1706 Last data filed at 05/28/2020 0918 Gross per 24 hour  Intake --  Output 1800 ml  Net -1800 ml   Weight change:  Exam:   General:  Pt is alert, follows commands  appropriately,  not in acute distress  HEENT: No icterus, No thrush, No neck mass, [place]/AT  Cardiovascular: RRR, S1/S2, no rubs, no gallops  Respiratory: CTA bilaterally, no wheezing, no crackles, no rhonchi  Abdomen: Soft/+BS, non tender, non distended, no guarding  Extremities: No edema, No lymphangitis, No petechiae, No rashes, no synovitis   Data Reviewed: I have personally reviewed following labs and imaging studies Basic Metabolic Panel: Recent Labs  Lab 05/22/20 1805 05/23/20 0600 05/24/20 0608 05/25/20 0636 05/26/20 0607 05/27/20 0452 05/28/20 0504  NA 137   < > 138 137 136 138 137  K 3.4*   < > 3.9 3.4* 3.1* 3.0* 3.0*  CL 101   < > 108 105 102 104 103  CO2 28   < > 24 26 25 26 28   GLUCOSE 159*   < > 118* 109* 138* 123* 132*  BUN 33*   < > 32* 24* 14 11 10   CREATININE 1.19   < > 1.04 0.86 0.80 0.78 0.82  CALCIUM 9.6   < > 9.2 8.9 8.8* 8.8* 8.7*  MG 1.9  --   --  1.9 1.8 1.8  --    < > = values in this interval not displayed.   Liver Function Tests: Recent Labs  Lab 05/22/20 1805  AST 15  ALT 12  ALKPHOS 66  BILITOT 0.7  PROT 6.2*  ALBUMIN 3.5   Recent Labs  Lab 05/22/20 1225  LIPASE 18  AMYLASE 58   No results for input(s): AMMONIA in the last 168 hours. Coagulation Profile: Recent Labs  Lab 05/22/20 1805  INR 1.2   CBC: Recent Labs  Lab 05/22/20 0130 05/22/20 1805 05/23/20 0600 05/24/20 0608 05/25/20 0608 05/25/20 0636 05/26/20 0607 05/27/20 0452 05/28/20 0504  WBC 19.4* 29.7*   < > 22.6*  --  14.2* 17.2* 13.6* 10.9*  NEUTROABS 17.2* 25.1*  --   --   --   --   --   --   --   HGB 14.6 14.1   < > 10.8* 10.8* 10.8* 12.4* 11.6* 11.8*  HCT 43.8 43.4   < > 34.6* 36.9* 34.8* 38.9* 36.7* 36.8*  MCV 81.6 82.4   < > 85.4  --  84.7 81.9 82.3 81.8  PLT 254 265   < > 198  --  212 231 235 227   < > = values in this interval not displayed.   Cardiac Enzymes: No results for input(s): CKTOTAL, CKMB, CKMBINDEX, TROPONINI in the last 168  hours. BNP: Invalid input(s): POCBNP CBG: No results for input(s): GLUCAP in the last 168 hours. HbA1C: No results for input(s): HGBA1C in the last 72 hours. Urine analysis:    Component Value Date/Time   COLORURINE YELLOW 05/22/2020 1111   APPEARANCEUR CLEAR 05/22/2020 1111   APPEARANCEUR Clear 02/15/2020 1321   LABSPEC 1.016 05/22/2020 1111   PHURINE 5.0 05/22/2020 1111   GLUCOSEU 150 (A) 05/22/2020 1111   HGBUR NEGATIVE 05/22/2020 1111   BILIRUBINUR NEGATIVE 05/22/2020 1111   BILIRUBINUR Negative 02/15/2020 1321   KETONESUR NEGATIVE 05/22/2020 1111   PROTEINUR >=300 (A) 05/22/2020 1111   UROBILINOGEN negative (A) 10/19/2019 0958   UROBILINOGEN 0.2 05/08/2014 2045   NITRITE NEGATIVE 05/22/2020 1111   LEUKOCYTESUR NEGATIVE 05/22/2020 1111   Sepsis Labs: @LABRCNTIP (procalcitonin:4,lacticidven:4) ) Recent Results (from the past 240 hour(s))  Culture, blood (routine x 2)     Status: None   Collection Time: 05/22/20  7:45 AM   Specimen: BLOOD  Result Value Ref Range Status  Specimen Description BLOOD RIGHT ANTECUBITAL DRAWN BY RN  Final   Special Requests   Final    BOTTLES DRAWN AEROBIC AND ANAEROBIC Blood Culture results may not be optimal due to an excessive volume of blood received in culture bottles   Culture   Final    NO GROWTH 5 DAYS Performed at [facility], [address], [place], [place] [address]    Report Status 05/27/2020 FINAL  Final  Culture, blood (routine x 2)     Status: None   Collection Time: 05/22/20  7:50 AM   Specimen: BLOOD  Result Value Ref Range Status   Specimen Description BLOOD LEFT ANTECUBITAL DRAWN BY RN  Final   Special Requests   Final    BOTTLES DRAWN AEROBIC AND ANAEROBIC Blood Culture results may not be optimal due to an excessive volume of blood received in culture bottles   Culture   Final    NO GROWTH 5 DAYS Performed at [facility], [address] [place], [place] [address]    Report Status 05/27/2020 FINAL  Final   Culture, Urine     Status: Abnormal   Collection Time: 05/22/20 11:12 AM   Specimen: Urine, Clean Catch  Result Value Ref Range Status   Specimen Description   Final    URINE, CLEAN CATCH Performed at [facility], [address], [place], [place] [address]    Special Requests   Final    NONE Performed at [facility], [address] [place], [place] [address]    Culture (A)  Final    <10,000 COLONIES/mL INSIGNIFICANT GROWTH Performed at [facility], [place] [address], [place], [place] [address]    Report Status 05/23/2020 FINAL  Final  Blood Culture (routine x 2)     Status: None   Collection Time: 05/22/20  6:18 PM   Specimen: BLOOD  Result Value Ref Range Status   Specimen Description BLOOD RIGHT ANTECUBITAL  Final   Special Requests   Final    BOTTLES DRAWN AEROBIC AND ANAEROBIC Blood Culture adequate volume   Culture   Final    NO GROWTH 5 DAYS Performed at [facility], [address] [place], [place] [address]    Report Status 05/27/2020 FINAL  Final  Blood Culture (routine x 2)     Status: None   Collection Time: 05/22/20  6:23 PM   Specimen: BLOOD  Result Value Ref Range Status   Specimen Description BLOOD BLOOD RIGHT WRIST  Final   Special Requests   Final    BOTTLES DRAWN AEROBIC AND ANAEROBIC Blood Culture adequate volume   Culture   Final    NO GROWTH 5 DAYS Performed at [facility], [address] [place], [place] [address]    Report Status 05/27/2020 FINAL  Final  C Difficile Quick Screen w PCR reflex     Status: None   Collection Time: 05/22/20 11:55 PM   Specimen: STOOL  Result Value Ref Range Status   C Diff antigen NEGATIVE NEGATIVE Final   C Diff toxin NEGATIVE NEGATIVE Final   C Diff interpretation No C. difficile detected.  Final    Comment: Performed at [facility], [address] [place], [place] [address]  Resp Panel by RT-PCR (Flu A&B, Covid) Nasopharyngeal Swab     Status: None   Collection Time: 05/23/20  1:00 AM   Specimen:  Nasopharyngeal Swab; Nasopharyngeal(NP) swabs in vial transport medium  Result Value Ref Range Status   SARS Coronavirus 2 by  RT PCR NEGATIVE NEGATIVE Final    Comment: (NOTE) SARS-CoV-2 target nucleic acids are NOT DETECTED.  The SARS-CoV-2 RNA is generally detectable in upper respiratory specimens during the acute phase of infection. The lowest concentration of SARS-CoV-2 viral copies this assay can detect is 138 copies/mL. A negative result does not preclude SARS-Cov-2 infection and should not be used as the sole basis for treatment or other patient management decisions. A negative result may occur with  improper specimen collection/handling, submission of specimen other than nasopharyngeal swab, presence of viral mutation(s) within the areas targeted by this assay, and inadequate number of viral copies(<138 copies/mL). A negative result must be combined with clinical observations, patient history, and epidemiological information. The expected result is Negative.  Fact Sheet for Patients:  EntrepreneurPulse.com.au  Fact Sheet for Healthcare Providers:  IncredibleEmployment.be  This test is no t yet approved or cleared by the [place] FDA and  has been authorized for detection and/or diagnosis of SARS-CoV-2 by FDA under an Emergency Use Authorization (EUA). This EUA will remain  in effect (meaning this test can be used) for the duration of the COVID-19 declaration under Section 564(b)(1) of the Act, 21 U.S.C.section 360bbb-3(b)(1), unless the authorization is terminated  or revoked sooner.       Influenza A by PCR NEGATIVE NEGATIVE Final   Influenza B by PCR NEGATIVE NEGATIVE Final    Comment: (NOTE) The Xpert Xpress SARS-CoV-2/FLU/RSV plus assay is intended as an aid in the diagnosis of influenza from Nasopharyngeal swab specimens and should not be used as a sole basis for treatment. Nasal washings and aspirates are unacceptable for  Xpert Xpress SARS-CoV-2/FLU/RSV testing.  Fact Sheet for Patients: EntrepreneurPulse.com.au  Fact Sheet for Healthcare Providers: IncredibleEmployment.be  This test is not yet approved or cleared by the [place] FDA and has been authorized for detection and/or diagnosis of SARS-CoV-2 by FDA under an Emergency Use Authorization (EUA). This EUA will remain in effect (meaning this test can be used) for the duration of the COVID-19 declaration under Section 564(b)(1) of the Act, 21 U.S.C. section 360bbb-3(b)(1), unless the authorization is terminated or revoked.  Performed at [facility], [address], [place], [place] [address]   MRSA PCR Screening     Status: None   Collection Time: 05/23/20  2:53 PM   Specimen: Nasal Mucosa; Nasopharyngeal  Result Value Ref Range Status   MRSA by PCR NEGATIVE NEGATIVE Final    Comment:        The GeneXpert MRSA Assay (FDA approved for NASAL specimens only), is one component of a comprehensive MRSA colonization surveillance program. It is not intended to diagnose MRSA infection nor to guide or monitor treatment for MRSA infections. Performed at [facility], [address], [place], [place] [address]   Gastrointestinal Panel by PCR , Stool     Status: None   Collection Time: 05/26/20  1:40 AM   Specimen: Stool  Result Value Ref Range Status   Campylobacter species NOT DETECTED NOT DETECTED Final   Plesimonas shigelloides NOT DETECTED NOT DETECTED Final   Salmonella species NOT DETECTED NOT DETECTED Final   Yersinia enterocolitica NOT DETECTED NOT DETECTED Final   Vibrio species NOT DETECTED NOT DETECTED Final   Vibrio cholerae NOT DETECTED NOT DETECTED Final   Enteroaggregative E coli (EAEC) NOT DETECTED NOT DETECTED Final   Enteropathogenic E coli (EPEC) NOT DETECTED NOT DETECTED Final   Enterotoxigenic E coli (ETEC) NOT DETECTED NOT DETECTED Final   Shiga like toxin producing E  coli (STEC) NOT  DETECTED NOT DETECTED Final   Shigella/Enteroinvasive E coli (EIEC) NOT DETECTED NOT DETECTED Final   Cryptosporidium NOT DETECTED NOT DETECTED Final   Cyclospora cayetanensis NOT DETECTED NOT DETECTED Final   Entamoeba histolytica NOT DETECTED NOT DETECTED Final   Giardia lamblia NOT DETECTED NOT DETECTED Final   Adenovirus F40/41 NOT DETECTED NOT DETECTED Final   Astrovirus NOT DETECTED NOT DETECTED Final   Norovirus GI/GII NOT DETECTED NOT DETECTED Final   Rotavirus A NOT DETECTED NOT DETECTED Final   Sapovirus (I, II, IV, and V) NOT DETECTED NOT DETECTED Final    Comment: Performed at [facility], [address] [place], [place] [address]     Scheduled Meds: . alfuzosin  10 mg Oral Q breakfast  . diltiazem  360 mg Oral Daily  . furosemide  20 mg Intravenous Once  . hydrALAZINE  100 mg Oral TID  . memantine  10 mg Oral BID  . metoprolol tartrate  25 mg Oral BID  . venlafaxine XR  75 mg Oral Q breakfast   Continuous Infusions: . cefTRIAXone (ROCEPHIN)  IV 2 g (05/28/20 0553)  . metronidazole 500 mg (05/28/20 1012)    Procedures/Studies: CT ABDOMEN PELVIS W CONTRAST  Result Date: 05/22/2020 CLINICAL DATA:  Abdominal pain, fever, rectal bleeding EXAM: CT ABDOMEN AND PELVIS WITH CONTRAST TECHNIQUE: Multidetector CT imaging of the abdomen and pelvis was performed using the standard protocol following bolus administration of intravenous contrast. CONTRAST:  175mL OMNIPAQUE IOHEXOL 300 MG/ML  SOLN COMPARISON:  May 2019 FINDINGS: Lower chest: Bibasilar atelectasis/scarring. Hepatobiliary: Small cyst of the left hepatic lobe. Few small layering gallstones. No biliary dilatation. Pancreas: Unremarkable. Spleen: Unremarkable. Adrenals/Urinary Tract: Adrenal glands are unremarkable. Bilateral renal cysts. 3 mm nonobstructing calculus of the lower pole the right kidney. Small bladder diverticula. Stomach/Bowel: Stomach is within normal limits. Distal colonic diverticulosis. There is  wall thickening along the distal transverse colon, splenic flexure, and descending colon. Vascular/Lymphatic: Aortic atherosclerosis. No enlarged lymph nodes. Reproductive: Unremarkable. Other: Small volume abdominopelvic ascites. No acute abnormality of the abdominal wall. Musculoskeletal: Right femoral fixation with associated streak artifact. Degenerative changes of the lumbar spine. No acute osseous abnormality. IMPRESSION: Findings consistent with colitis involving the distal transverse colon, splenic flexure, and descending colon. This is likely ischemic or infectious in etiology. Diverticulosis is present but mostly distal to the area of thickening and diverticulitis is therefore less likely. 3 mm nonobstructing left renal calculus. Small bladder diverticula. Electronically Signed   By: Macy Mis M.D.   On: 05/22/2020 20:12   DG Pelvis Portable  Result Date: 05/22/2020 CLINICAL DATA:  Fall, pelvic injury EXAM: PORTABLE PELVIS 1-2 VIEWS COMPARISON:  None. FINDINGS: ORIF of a right subcapital femoral neck fracture has been performed utilizing 4 partially-threaded screws with incomplete healing of the a fracture. No acute fracture or dislocation. Mild bilateral hip joint space narrowing is present in keeping with at least mild bilateral degenerative arthritis. Contrast is seen within the bladder lumen. Small bladder diverticulum present as well as trabeculation of the bladder wall is present in keeping with changes of probable chronic bladder outlet obstruction. Defect involving the bladder base likely represents mass effect from a hypertrophied central prostatic lobe. IMPRESSION: Status post right hip ORIF.  No acute fracture or dislocation. Findings in keeping with changes of chronic bladder outlet obstruction, possibly related to central prostatic hypertrophy. Electronically Signed   By: Fidela Salisbury MD   On: 05/22/2020 23:07   DG Chest Port 1  View  Result Date: 05/22/2020 CLINICAL DATA:   Questionable sepsis - evaluate for abnormality Patient reports dizziness leading to fall and abdominal pain. EXAM: PORTABLE CHEST 1 VIEW COMPARISON:  Radiograph 03/12/2020 FINDINGS: Improved cardiomegaly from prior. Unchanged mediastinal contours with aortic atherosclerosis and tortuosity. Chronic interstitial coarsening. Streaky atelectasis at the right lung base. No confluent consolidation. No pulmonary edema or large pleural effusion. No pneumothorax. No acute osseous abnormalities are seen. IMPRESSION: 1. Mild right basilar atelectasis. 2. Improved cardiomegaly from prior. Stable aortic atherosclerosis and tortuosity. Electronically Signed   By: Keith Rake M.D.   On: 05/22/2020 18:50   CT Angio Abd/Pel w/ and/or w/o  Result Date: 05/26/2020 CLINICAL DATA:  Left abdominal pain, diarrhea, suspicion of recurrent colitis, rule out mesenteric ischemia EXAM: CTA ABDOMEN AND PELVIS WITH CONTRAST TECHNIQUE: Multidetector CT imaging of the abdomen and pelvis was performed using the standard protocol during bolus administration of intravenous contrast. Multiplanar reconstructed images and MIPs were obtained and reviewed to evaluate the vascular anatomy. CONTRAST:  153mL OMNIPAQUE IOHEXOL 350 MG/ML SOLN COMPARISON:  05/22/2020 and previous FINDINGS: VASCULAR Aorta: Moderate calcified atheromatous plaque. No aneurysm, dissection, or stenosis. Celiac: Calcified ostial plaque without stenosis. Mild narrowing at the median arcuate ligament of the diaphragm, with minimal poststenotic dilatation, patent distally. SMA: Widely patent.  Minimal plaque.  Classic distal branch anatomy. Renals: Duplicated left, superior dominant with calcified ostial plaque, no stenosis. The inferior left renal artery is aberrant, arising below the level of the IMA origin just above the aortic bifurcation, supplying a portion of the lower pole. Duplicated right, superior dominant with calcified ostial plaque resulting in short segment  stenosis of at least mild severity, patent distally. The more diminutive inferior right renal artery is aberrant arising below the level of the IMA, supplying a portion of the lower pole. IMA: Ostial stenosis of possible hemodynamic significance. Tandem heavily calcified lesion approximately 2.5 cm from the origin resulting in stenosis of probable hemodynamic significance. Inflow: Moderate calcified atheromatous plaque about the bifurcations of both common iliac arteries. No aneurysm, dissection, or stenosis. Proximal Outflow: Bilateral common femoral and visualized portions of the superficial and profunda femoral arteries are patent without evidence of aneurysm, dissection, vasculitis or significant stenosis. Veins: No obvious venous abnormality within the limitations of this arterial phase study. Retroaortic left renal vein, an anatomic variant. Review of the MIP images confirms the above findings. NON-VASCULAR Lower chest: Small bilateral pleural effusions, and dependent atelectasis/consolidation posteriorly in both lung bases, increased since previous. Hepatobiliary: Benign cyst in hepatic for segment 4B, present since 08/15/2010. No new liver lesion or biliary ductal dilatation. Vicarious excretion of contrast material into the gallbladder obscuring the small calculi seen on the prior study. Pancreas: Unremarkable. No pancreatic ductal dilatation or surrounding inflammatory changes. Spleen: Normal in size without focal abnormality. Stable accessory splenule. Adrenals/Urinary Tract: Adrenal glands unremarkable. 6.7 cm cyst from the upper pole left kidney, slowly enlarging since 2012. 5.5 cm exophytic cyst from the mid right kidney, and 3.2 cm cyst from the right lower pole, present since 2012. 5 mm calcification in the lower pole right renal collecting system. 1 mm calcification in the left ureter at the level of the SI joint with stable mild pelvicaliectasis. Stomach/Bowel: Stomach is decompressed. Appendix not  discretely identified no pericecal inflammatory/edematous change. The colon is nondilated. There is circumferential wall thickening in the distal transverse colon, splenic flexure, and descending colon. Multiple sigmoid diverticula without abscess. The rectum is nondistended. Lymphatic: Subcentimeter left para-aortic lymph node. No  pelvic or mesenteric adenopathy. Reproductive: Mild prostate enlargement. Other: Small volume ascites in the lower abdomen, slightly increased since previous. No free air. Musculoskeletal: Lower lumbar spondylitic changes most marked L4-5. Orthopedic pins across the right femoral neck. No acute fracture or worrisome bone lesion. IMPRESSION: 1. Persistent colitis involving distal transverse colon, splenic flexure, and descending colon. 2. Mild lower abdominal ascites, slightly increased on a. 3. 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 The celiac axis and superior mesenteric artery remain widely patent however, which typically allows adequate collateral perfusion. 4. Increase in small bilateral pleural effusions and dependent atelectasis/consolidation in both lung bases since previous study. 5. Sigmoid diverticulosis. 6. 1 mm mid left ureteral calculus with stable left renal pelvicaliectasis. 7. Right nephrolithiasis. 8. Bilateral renal artery ostial stenosis of possible hemodynamic significance. Aortic Atherosclerosis (ICD10-I70.0). Electronically Signed   By: Lucrezia Europe M.D.   On: 05/26/2020 08:35    Orson Eva, DO  [facility]  If 7PM-7AM, please contact night-coverage [email or web address] Password [email or web address] 05/28/2020, 5:06 PM   LOS: 6 days

## 2020-05-28 NOTE — Progress Notes (Signed)
Subjective: Patient notes 11 loose bowel movements overnight.  Now having right-sided abdominal pain.  No melena hematochezia.  Hemoglobin stable.  Objective: Vital signs in last 24 hours: Temp:  [97.3 F (36.3 C)-99 F (37.2 C)] 99 F (37.2 C) (12/19 1411) Pulse Rate:  [70-95] 95 (12/19 0407) Resp:  [18-20] 20 (12/19 1411) BP: (144-183)/(72-89) 155/73 (12/19 1411) SpO2:  [94 %-95 %] 94 % (12/19 1411) Last BM Date: 05/28/20 General:   Alert and oriented, pleasant Head:  Normocephalic and atraumatic. Eyes:  No icterus, sclera clear. Conjuctiva pink.  Mouth:  Without lesions, mucosa pink and moist.  Neck:  Supple, without thyromegaly or masses.  Heart:  S1, S2 present, no murmurs noted.  Lungs: Clear to auscultation bilaterally, without wheezing, rales, or rhonchi.  Abdomen:  Bowel sounds present, soft, non-tender, non-distended. No HSM or hernias noted. No rebound or guarding. No masses appreciated  Msk:  Symmetrical without gross deformities. Normal posture. Pulses:  Normal pulses noted. Extremities:  Without clubbing or edema. Neurologic:  Alert and  oriented x4;  grossly normal neurologically. Skin:  Warm and dry, intact without significant lesions.  Cervical Nodes:  No significant cervical adenopathy. Psych:  Alert and cooperative. Normal mood and affect.  Intake/Output from previous day: 12/18 0701 - 12/19 0700 In: -  Out: 1100 [Urine:1100] Intake/Output this shift: Total I/O In: -  Out: 1000 [Urine:1000]  Lab Results: Recent Labs    05/26/20 0607 05/27/20 0452 05/28/20 0504  WBC 17.2* 13.6* 10.9*  HGB 12.4* 11.6* 11.8*  HCT 38.9* 36.7* 36.8*  PLT 231 235 227   BMET Recent Labs    05/26/20 0607 05/27/20 0452 05/28/20 0504  NA 136 138 137  K 3.1* 3.0* 3.0*  CL 102 104 103  CO2 25 26 28   GLUCOSE 138* 123* 132*  BUN 14 11 10   CREATININE 0.80 0.78 0.82  CALCIUM 8.8* 8.8* 8.7*   LFT No results for input(s): PROT, ALBUMIN, AST, ALT, ALKPHOS, BILITOT,  BILIDIR, IBILI in the last 72 hours. PT/INR No results for input(s): LABPROT, INR in the last 72 hours. Hepatitis Panel No results for input(s): HEPBSAG, HCVAB, HEPAIGM, HEPBIGM in the last 72 hours.   Studies/Results: No results found.   Assessment: *Left-sided colitis-ischemic versus infectious *Left lower quadrant abdominal pain *Diarrhea  Plan: Patient reports 11 bowel movements overnight.   C. difficile negative, GI panel PCR negative.  Continue supportive measures.  We may need to pursue flexible sigmoidoscopy in the a.m. to further evaluate as patient appears to have a taken a step backwards clinically today.  Recommend placing back on clear liquids today.  N.p.o. after midnight.  We will evaluate first thing in the morning for potential flexible sigmoidoscopy.  GI to continue to follow.  Elon Alas. Abbey Chatters, D.O. Gastroenterology and Hepatology [facility]   LOS: 6 days    05/28/2020, 3:05 PM

## 2020-05-29 ENCOUNTER — Telehealth: Payer: Self-pay

## 2020-05-29 DIAGNOSIS — A419 Sepsis, unspecified organism: Principal | ICD-10-CM

## 2020-05-29 LAB — BASIC METABOLIC PANEL
Anion gap: 8 (ref 5–15)
BUN: 10 mg/dL (ref 8–23)
CO2: 27 mmol/L (ref 22–32)
Calcium: 8.8 mg/dL — ABNORMAL LOW (ref 8.9–10.3)
Chloride: 102 mmol/L (ref 98–111)
Creatinine, Ser: 0.78 mg/dL (ref 0.61–1.24)
GFR, Estimated: >60 mL/min (ref >60)
Glucose, Bld: 127 mg/dL — ABNORMAL HIGH (ref 70–99)
Potassium: 2.9 mmol/L — ABNORMAL LOW (ref 3.5–5.1)
Sodium: 137 mmol/L (ref 135–145)

## 2020-05-29 LAB — CBC
HCT: 37.6 % — ABNORMAL LOW (ref 39.0–52.0)
Hemoglobin: 11.8 g/dL — ABNORMAL LOW (ref 13.0–17.0)
MCH: 25.7 pg — ABNORMAL LOW (ref 26.0–34.0)
MCHC: 31.4 g/dL (ref 30.0–36.0)
MCV: 81.7 fL (ref 80.0–100.0)
Platelets: 230 10*3/uL (ref 150–400)
RBC: 4.6 MIL/uL (ref 4.22–5.81)
RDW: 15.1 % (ref 11.5–15.5)
WBC: 9.8 10*3/uL (ref 4.0–10.5)
nRBC: 0 % (ref 0.0–0.2)

## 2020-05-29 LAB — MAGNESIUM: Magnesium: 1.9 mg/dL (ref 1.7–2.4)

## 2020-05-29 MED ORDER — FUROSEMIDE 10 MG/ML IJ SOLN
20.0000 mg | Freq: Once | INTRAMUSCULAR | Status: AC
  Administered 2020-05-29: 22:00:00 20 mg via INTRAVENOUS
  Filled 2020-05-29: qty 2

## 2020-05-29 MED ORDER — POTASSIUM CHLORIDE CRYS ER 20 MEQ PO TBCR
40.0000 meq | EXTENDED_RELEASE_TABLET | Freq: Once | ORAL | Status: DC
  Filled 2020-05-29: qty 2

## 2020-05-29 MED ORDER — POTASSIUM CHLORIDE CRYS ER 20 MEQ PO TBCR
40.0000 meq | EXTENDED_RELEASE_TABLET | Freq: Once | ORAL | Status: AC
  Administered 2020-05-29: 23:00:00 40 meq via ORAL
  Filled 2020-05-29: qty 2

## 2020-05-29 NOTE — Telephone Encounter (Signed)
Yes if he is in hospital his appointment needs to be RS

## 2020-05-29 NOTE — Progress Notes (Signed)
    Subjective: 2 loose stools overnight. None this morning. No rectal bleeding. Abdomen feels full. Complains of gas. Abdominal pain improved since admission but gas is moreso the issue now. Uncomfortable with gas but improved with passing flatus.   Objective: Vital signs in last 24 hours: Temp:  [98.1 F (36.7 C)-99 F (37.2 C)] 98.1 F (36.7 C) (12/20 0509) Pulse Rate:  [65-85] 65 (12/20 0509) Resp:  [20] 20 (12/20 0509) BP: (155-182)/(73-86) 172/81 (12/20 0509) SpO2:  [94 %-97 %] 97 % (12/20 0509) Last BM Date: 05/29/20 General:   Alert and oriented, pleasant Head:  Normocephalic and atraumatic. Abdomen:  Abdomen full, round, but soft. No TTP. +BS. No rebound or guarding. Rectus diastasis vs ventral hernia Neurologic:  Alert and  oriented x4 Psych:  Alert and cooperative. Normal mood and affect.  Intake/Output from previous day: 12/19 0701 - 12/20 0700 In: -  Out: 3200 [Urine:3200] Intake/Output this shift: No intake/output data recorded.  Lab Results: Recent Labs    05/27/20 0452 05/28/20 0504 05/29/20 0510  WBC 13.6* 10.9* 9.8  HGB 11.6* 11.8* 11.8*  HCT 36.7* 36.8* 37.6*  PLT 235 227 230   BMET Recent Labs    05/27/20 0452 05/28/20 0504 05/29/20 0510  NA 138 137 137  K 3.0* 3.0* 2.9*  CL 104 103 102  CO2 26 28 27   GLUCOSE 123* 132* 127*  BUN 11 10 10   CREATININE 0.78 0.82 0.78  CALCIUM 8.8* 8.7* 8.8*    Assessment: 86 year old male admitted with abdominal pain, vomiting, diarrhea, rectal bleeding, with CT findings of colitis involving distal transverse colon, splenic flexure, and descending colon. CTA with persistent colitis, proximal stenoses of IMA but celiac and SMA remain widely patent. Cdiff and GI path panel negative. Suspicious for ischemic colitis.   Today, he has had reduction in diarrhea, with 2 loose stools overnight and none this morning. Main complaint is gas; notes abdominal discomfort but improved from admission and relieved with  passing flatus. I do note his abdomen is full but soft. I'm unclear his baseline. Monitor for worsening and would recommend repeat imaging to exclude evolving ileus. No concerning signs for obstruction or acute abdomen on physical exam. Will monitor clinically for now. Leukocytosis resolved.   Hypokalemia: persistent. repletion per hospitalist.  Plan: Clear liquids for now. Would advance diet slowly and ensure he is tolerating before advancement Continue empiric antibiotics  Potassium repletion per hospitalist Hold off on flex sig unless recurrent diarrhea and failing to improve Will continue to follow with you   Annitta Needs, PhD, ANP-BC [facility]      LOS: 7 days    05/29/2020, 8:01 AM

## 2020-05-29 NOTE — Plan of Care (Signed)

## 2020-05-29 NOTE — Progress Notes (Signed)
PROGRESS NOTE  Jeffrey Sherman UKG:[id number] DOB: 1929-11-29 DOA: 05/22/2020 PCP: Celene Squibb, MD   Brief History: 86 y.o.malewith medical history significant forprostate cancer, dementia, hypertension, peptic ulcer disease. [facility]Patient's symptoms started12/12/21, with nausea vomiting and diarrhea. There was some bright red blood in his stool. He also reports vomiting.Patient reports abdominal pain was very severe,and all over his abdomen particularly left side of abdomen.  ED Course:, Tachycardic to 116, intermittent tachypnea to 24, blood pressure systolic 762G to 315V, O2 sats greater than 91% on room air. Potassium 3.4. WBC 29. Abd CT-consistent with colitis involving the distal transverse, splenic flexure and descending colon-infectious versus hischemic etiology. Portable chest x-ray unremarkable. IV cefepime and metronidazolewas initially started. Hospitalist to admit for likely infectious colitis.Cefepime was changed to ceftriaxone. GI was consulted to assist The patient was placed on bowel rest and diet slowly advanced.  His hospitalization has been prolonged due to slow improvement and some clinical regression when his diet has been advanced.  Assessment/Plan:   Sepsis secondary to colitis -Patient presented with fever, leukocytosis, tachycardia and tachypnea -Abdominal CT indicated distal transverse splenic flexure and sigmoid colon colitis -Stool for C. difficile found to be negative -GI pathogen panel neg -only one small streak BM with blood 12/16 -Patient is currently on intravenous antibiotics -Repeat labs in a.m. -suspect ischemic colitis -continuefull liquid>>advance to soft diet -appreciate GI consult>>>CTA abd/pelvis -12/16 CTA abd/pelvis-persistent colitis; tandem stenosis of IMA but patent celiac axis and SMA; Colon is nondilated  Hematochezia/Ischemic  Colitis -due to ischemic colitis -no hematochezia on 12/18-12/20 -drop in Hb due to dilution -discussed with GI, Dr. Malachi Carl flex sig today as pt is improving  Pleural Effusions -lasix IV x 1-->repeat 12/20 -stable on RA -due to third-spacing  Hypertension -Continued on diltiazem and hydralazine -Lisinopril currently on hold since he was exposed to contrastand RAS -addedmetoprolol  Prostate cancer -Continue follow-up with urology -Continue on alfuzosin  Dementiawithout behavioral disturbance -continue Namenda  Hypokalemia -replete -check mag--1.8       Status is: Inpatient  Remains inpatient appropriate because:IV treatments appropriate due to intensity of illness or inability to take PO   Dispo: The patient is from:SNF Anticipated d/c is to:SNF Anticipated d/c date is: 1 day Patient currently is not medically stable to d/c.        Family Communication:daughter-in-law updated bedside 12/18  Consultants:none  Code Status: DNR  DVT Prophylaxis: SCDs   Procedures: As Listed in Progress Note Above  Antibiotics: Ceftriaxone 12/14>> Cefepime 12/13>>12/14 metronidazole 12/13>>    Subjective: Patient had 2 BMs last night, none today.  No hematochezia or melena.  Feels that abd pain is better today.  Denies f/c, cp, sob, n/v  Objective: Vitals:   05/28/20 1411 05/28/20 2117 05/29/20 0509 05/29/20 1409  BP: (!) 155/73 (!) 182/86 (!) 172/81 (!) 153/71  Pulse:  85 65 70  Resp: 20 20 20 18   Temp: 99 F (37.2 C) 98.9 F (37.2 C) 98.1 F (36.7 C) 98.3 F (36.8 C)  TempSrc: Oral Oral Oral Oral  SpO2: 94% 94% 97% 98%  Weight:      Height:        Intake/Output Summary (Last 24 hours) at 05/29/2020 1810 Last data filed at 05/29/2020 0900 Gross per 24 hour  Intake 600 ml  Output 2200 ml  Net -1600 ml   Weight change:  Exam:   General:  Pt is  alert, follows  commands appropriately, not in acute distress  HEENT: No icterus, No thrush, No neck mass, [place]/AT  Cardiovascular: RRR, S1/S2, no rubs, no gallops  Respiratory: bibasilar rales. No wheeze  Abdomen: Soft/+BS, LLQ and RLQ tender, non distended, no guarding  Extremities: No edema, No lymphangitis, No petechiae, No rashes, no synovitis   Data Reviewed: I have personally reviewed following labs and imaging studies Basic Metabolic Panel: Recent Labs  Lab 05/25/20 0636 05/26/20 0607 05/27/20 0452 05/28/20 0504 05/29/20 0510  NA 137 136 138 137 137  K 3.4* 3.1* 3.0* 3.0* 2.9*  CL 105 102 104 103 102  CO2 26 25 26 28 27   GLUCOSE 109* 138* 123* 132* 127*  BUN 24* 14 11 10 10   CREATININE 0.86 0.80 0.78 0.82 0.78  CALCIUM 8.9 8.8* 8.8* 8.7* 8.8*  MG 1.9 1.8 1.8  --  1.9   Liver Function Tests: No results for input(s): AST, ALT, ALKPHOS, BILITOT, PROT, ALBUMIN in the last 168 hours. No results for input(s): LIPASE, AMYLASE in the last 168 hours. No results for input(s): AMMONIA in the last 168 hours. Coagulation Profile: No results for input(s): INR, PROTIME in the last 168 hours. CBC: Recent Labs  Lab 05/25/20 0636 05/26/20 0607 05/27/20 0452 05/28/20 0504 05/29/20 0510  WBC 14.2* 17.2* 13.6* 10.9* 9.8  HGB 10.8* 12.4* 11.6* 11.8* 11.8*  HCT 34.8* 38.9* 36.7* 36.8* 37.6*  MCV 84.7 81.9 82.3 81.8 81.7  PLT 212 231 235 227 230   Cardiac Enzymes: No results for input(s): CKTOTAL, CKMB, CKMBINDEX, TROPONINI in the last 168 hours. BNP: Invalid input(s): POCBNP CBG: No results for input(s): GLUCAP in the last 168 hours. HbA1C: No results for input(s): HGBA1C in the last 72 hours. Urine analysis:    Component Value Date/Time   COLORURINE YELLOW 05/22/2020 1111   APPEARANCEUR CLEAR 05/22/2020 1111   APPEARANCEUR Clear 02/15/2020 1321   LABSPEC 1.016 05/22/2020 1111   PHURINE 5.0 05/22/2020 1111   GLUCOSEU 150 (A) 05/22/2020 1111   HGBUR NEGATIVE 05/22/2020 1111    BILIRUBINUR NEGATIVE 05/22/2020 1111   BILIRUBINUR Negative 02/15/2020 1321   KETONESUR NEGATIVE 05/22/2020 1111   PROTEINUR >=300 (A) 05/22/2020 1111   UROBILINOGEN negative (A) 10/19/2019 0958   UROBILINOGEN 0.2 05/08/2014 2045   NITRITE NEGATIVE 05/22/2020 1111   LEUKOCYTESUR NEGATIVE 05/22/2020 1111   Sepsis Labs: @LABRCNTIP (procalcitonin:4,lacticidven:4) ) Recent Results (from the past 240 hour(s))  Culture, blood (routine x 2)     Status: None   Collection Time: 05/22/20  7:45 AM   Specimen: BLOOD  Result Value Ref Range Status   Specimen Description BLOOD RIGHT ANTECUBITAL DRAWN BY RN  Final   Special Requests   Final    BOTTLES DRAWN AEROBIC AND ANAEROBIC Blood Culture results may not be optimal due to an excessive volume of blood received in culture bottles   Culture   Final    NO GROWTH 5 DAYS Performed at [facility], [address], [place], [place] [address]    Report Status 05/27/2020 FINAL  Final  Culture, blood (routine x 2)     Status: None   Collection Time: 05/22/20  7:50 AM   Specimen: BLOOD  Result Value Ref Range Status   Specimen Description BLOOD LEFT ANTECUBITAL DRAWN BY RN  Final   Special Requests   Final    BOTTLES DRAWN AEROBIC AND ANAEROBIC Blood Culture results may not be optimal due to an excessive volume of blood received in culture bottles   Culture  Final    NO GROWTH 5 DAYS Performed at [facility], [address] [place], [place] [address]    Report Status 05/27/2020 FINAL  Final  Culture, Urine     Status: Abnormal   Collection Time: 05/22/20 11:12 AM   Specimen: Urine, Clean Catch  Result Value Ref Range Status   Specimen Description   Final    URINE, CLEAN CATCH Performed at [facility], [address], [place], [place] [address]    Special Requests   Final    NONE Performed at [facility], [address] [place], [place] [address]    Culture (A)  Final    <10,000 COLONIES/mL INSIGNIFICANT GROWTH Performed at [facility] [address] [place], [place] [address]    Report Status 05/23/2020 FINAL  Final  Blood Culture (routine x 2)     Status: None   Collection Time: 05/22/20  6:18 PM   Specimen: BLOOD  Result Value Ref Range Status   Specimen Description BLOOD RIGHT ANTECUBITAL  Final   Special Requests   Final    BOTTLES DRAWN AEROBIC AND ANAEROBIC Blood Culture adequate volume   Culture   Final    NO GROWTH 5 DAYS Performed at [facility], [address], [place], [place] [address]    Report Status 05/27/2020 FINAL  Final  Blood Culture (routine x 2)     Status: None   Collection Time: 05/22/20  6:23 PM   Specimen: BLOOD  Result Value Ref Range Status   Specimen Description BLOOD BLOOD RIGHT WRIST  Final   Special Requests   Final    BOTTLES DRAWN AEROBIC AND ANAEROBIC Blood Culture adequate volume   Culture   Final    NO GROWTH 5 DAYS Performed at [facility], [address], [place], [place] [address]    Report Status 05/27/2020 FINAL  Final  C Difficile Quick Screen w PCR reflex     Status: None   Collection Time: 05/22/20 11:55 PM   Specimen: STOOL  Result Value Ref Range Status   C Diff antigen NEGATIVE NEGATIVE Final   C Diff toxin NEGATIVE NEGATIVE Final   C Diff interpretation No C. difficile detected.  Final    Comment: Performed at [facility], [address], [place],  [address]  Resp Panel by RT-PCR (Flu A&B, Covid) Nasopharyngeal Swab     Status: None   Collection Time: 05/23/20  1:00 AM   Specimen: Nasopharyngeal Swab; Nasopharyngeal(NP) swabs in vial transport medium  Result Value Ref Range Status   SARS Coronavirus 2 by RT PCR NEGATIVE NEGATIVE Final    Comment: (NOTE) SARS-CoV-2 target nucleic acids are NOT DETECTED.  The SARS-CoV-2 RNA is generally detectable in upper respiratory specimens during the acute phase of infection. The lowest concentration of SARS-CoV-2 viral copies this assay can detect is 138 copies/mL. A negative result does not preclude  SARS-Cov-2 infection and should not be used as the sole basis for treatment or other patient management decisions. A negative result may occur with  improper specimen collection/handling, submission of specimen other than nasopharyngeal swab, presence of viral mutation(s) within the areas targeted by this assay, and inadequate number of viral copies(<138 copies/mL). A negative result must be combined with clinical observations, patient history, and epidemiological information. The expected result is Negative.  Fact Sheet for Patients:  EntrepreneurPulse.com.au  Fact Sheet for Healthcare Providers:  IncredibleEmployment.be  This test is no t yet approved or cleared by the [place] FDA  and  has been authorized for detection and/or diagnosis of SARS-CoV-2 by FDA under an Emergency Use Authorization (EUA). This EUA will remain  in effect (meaning this test can be used) for the duration of the COVID-19 declaration under Section 564(b)(1) of the Act, 21 U.S.C.section 360bbb-3(b)(1), unless the authorization is terminated  or revoked sooner.       Influenza A by PCR NEGATIVE NEGATIVE Final   Influenza B by PCR NEGATIVE NEGATIVE Final    Comment: (NOTE) The Xpert Xpress SARS-CoV-2/FLU/RSV plus assay is intended as an aid in the diagnosis of influenza from Nasopharyngeal swab specimens and should not be used as a sole basis for treatment. Nasal washings and aspirates are unacceptable for Xpert Xpress SARS-CoV-2/FLU/RSV testing.  Fact Sheet for Patients: EntrepreneurPulse.com.au  Fact Sheet for Healthcare Providers: IncredibleEmployment.be  This test is not yet approved or cleared by the [place] FDA and has been authorized for detection and/or diagnosis of SARS-CoV-2 by FDA under an Emergency Use Authorization (EUA). This EUA will remain in effect (meaning this test can be used) for the duration of  the COVID-19 declaration under Section 564(b)(1) of the Act, 21 U.S.C. section 360bbb-3(b)(1), unless the authorization is terminated or revoked.  Performed at [facility], [address], [place], [place] [address]   MRSA PCR Screening     Status: None   Collection Time: 05/23/20  2:53 PM   Specimen: Nasal Mucosa; Nasopharyngeal  Result Value Ref Range Status   MRSA by PCR NEGATIVE NEGATIVE Final    Comment:        The GeneXpert MRSA Assay (FDA approved for NASAL specimens only), is one component of a comprehensive MRSA colonization surveillance program. It is not intended to diagnose MRSA infection nor to guide or monitor treatment for MRSA infections. Performed at [facility], [address] [place], [place] [address]   Gastrointestinal Panel by PCR , Stool     Status: None   Collection Time: 05/26/20  1:40 AM   Specimen: Stool  Result Value Ref Range Status   Campylobacter species NOT DETECTED NOT DETECTED Final   Plesimonas shigelloides NOT DETECTED NOT DETECTED Final   Salmonella species NOT DETECTED NOT DETECTED Final   Yersinia enterocolitica NOT DETECTED NOT DETECTED Final   Vibrio species NOT DETECTED NOT DETECTED Final   Vibrio cholerae NOT DETECTED NOT DETECTED Final   Enteroaggregative E coli (EAEC) NOT DETECTED NOT DETECTED Final   Enteropathogenic E coli (EPEC) NOT DETECTED NOT DETECTED Final   Enterotoxigenic E coli (ETEC) NOT DETECTED NOT DETECTED Final   Shiga like toxin producing E coli (STEC) NOT DETECTED NOT DETECTED Final   Shigella/Enteroinvasive E coli (EIEC) NOT DETECTED NOT DETECTED Final   Cryptosporidium NOT DETECTED NOT DETECTED Final   Cyclospora cayetanensis NOT DETECTED NOT DETECTED Final   Entamoeba histolytica NOT DETECTED NOT DETECTED Final   Giardia lamblia NOT DETECTED NOT DETECTED Final   Adenovirus F40/41 NOT DETECTED NOT DETECTED Final   Astrovirus NOT DETECTED NOT DETECTED Final   Norovirus GI/GII NOT DETECTED NOT DETECTED Final    Rotavirus A NOT DETECTED NOT DETECTED Final   Sapovirus (I, II, IV, and V) NOT DETECTED NOT DETECTED Final    Comment: Performed at [facility], [address] [place], [place] [address]     Scheduled Meds: . alfuzosin  10 mg Oral Q breakfast  . diltiazem  360 mg Oral Daily  . hydrALAZINE  100 mg Oral TID  . memantine  10 mg Oral BID  .  metoprolol tartrate  25 mg Oral BID  . venlafaxine XR  75 mg Oral Q breakfast   Continuous Infusions: . cefTRIAXone (ROCEPHIN)  IV 2 g (05/29/20 0617)  . metronidazole 500 mg (05/29/20 1038)    Procedures/Studies: CT ABDOMEN PELVIS W CONTRAST  Result Date: 05/22/2020 CLINICAL DATA:  Abdominal pain, fever, rectal bleeding EXAM: CT ABDOMEN AND PELVIS WITH CONTRAST TECHNIQUE: Multidetector CT imaging of the abdomen and pelvis was performed using the standard protocol following bolus administration of intravenous contrast. CONTRAST:  144mL OMNIPAQUE IOHEXOL 300 MG/ML  SOLN COMPARISON:  May 2019 FINDINGS: Lower chest: Bibasilar atelectasis/scarring. Hepatobiliary: Small cyst of the left hepatic lobe. Few small layering gallstones. No biliary dilatation. Pancreas: Unremarkable. Spleen: Unremarkable. Adrenals/Urinary Tract: Adrenal glands are unremarkable. Bilateral renal cysts. 3 mm nonobstructing calculus of the lower pole the right kidney. Small bladder diverticula. Stomach/Bowel: Stomach is within normal limits. Distal colonic diverticulosis. There is wall thickening along the distal transverse colon, splenic flexure, and descending colon. Vascular/Lymphatic: Aortic atherosclerosis. No enlarged lymph nodes. Reproductive: Unremarkable. Other: Small volume abdominopelvic ascites. No acute abnormality of the abdominal wall. Musculoskeletal: Right femoral fixation with associated streak artifact. Degenerative changes of the lumbar spine. No acute osseous abnormality. IMPRESSION: Findings consistent with colitis involving the distal transverse colon,  splenic flexure, and descending colon. This is likely ischemic or infectious in etiology. Diverticulosis is present but mostly distal to the area of thickening and diverticulitis is therefore less likely. 3 mm nonobstructing left renal calculus. Small bladder diverticula. Electronically Signed   By: Macy Mis M.D.   On: 05/22/2020 20:12   DG Pelvis Portable  Result Date: 05/22/2020 CLINICAL DATA:  Fall, pelvic injury EXAM: PORTABLE PELVIS 1-2 VIEWS COMPARISON:  None. FINDINGS: ORIF of a right subcapital femoral neck fracture has been performed utilizing 4 partially-threaded screws with incomplete healing of the a fracture. No acute fracture or dislocation. Mild bilateral hip joint space narrowing is present in keeping with at least mild bilateral degenerative arthritis. Contrast is seen within the bladder lumen. Small bladder diverticulum present as well as trabeculation of the bladder wall is present in keeping with changes of probable chronic bladder outlet obstruction. Defect involving the bladder base likely represents mass effect from a hypertrophied central prostatic lobe. IMPRESSION: Status post right hip ORIF.  No acute fracture or dislocation. Findings in keeping with changes of chronic bladder outlet obstruction, possibly related to central prostatic hypertrophy. Electronically Signed   By: Fidela Salisbury MD   On: 05/22/2020 23:07   DG Chest Port 1 View  Result Date: 05/22/2020 CLINICAL DATA:  Questionable sepsis - evaluate for abnormality Patient reports dizziness leading to fall and abdominal pain. EXAM: PORTABLE CHEST 1 VIEW COMPARISON:  Radiograph 03/12/2020 FINDINGS: Improved cardiomegaly from prior. Unchanged mediastinal contours with aortic atherosclerosis and tortuosity. Chronic interstitial coarsening. Streaky atelectasis at the right lung base. No confluent consolidation. No pulmonary edema or large pleural effusion. No pneumothorax. No acute osseous abnormalities are seen.  IMPRESSION: 1. Mild right basilar atelectasis. 2. Improved cardiomegaly from prior. Stable aortic atherosclerosis and tortuosity. Electronically Signed   By: Keith Rake M.D.   On: 05/22/2020 18:50   CT Angio Abd/Pel w/ and/or w/o  Result Date: 05/26/2020 CLINICAL DATA:  Left abdominal pain, diarrhea, suspicion of recurrent colitis, rule out mesenteric ischemia EXAM: CTA ABDOMEN AND PELVIS WITH CONTRAST TECHNIQUE: Multidetector CT imaging of the abdomen and pelvis was performed using the standard protocol during bolus administration of intravenous contrast. Multiplanar reconstructed images and MIPs were obtained  and reviewed to evaluate the vascular anatomy. CONTRAST:  137mL OMNIPAQUE IOHEXOL 350 MG/ML SOLN COMPARISON:  05/22/2020 and previous FINDINGS: VASCULAR Aorta: Moderate calcified atheromatous plaque. No aneurysm, dissection, or stenosis. Celiac: Calcified ostial plaque without stenosis. Mild narrowing at the median arcuate ligament of the diaphragm, with minimal poststenotic dilatation, patent distally. SMA: Widely patent.  Minimal plaque.  Classic distal branch anatomy. Renals: Duplicated left, superior dominant with calcified ostial plaque, no stenosis. The inferior left renal artery is aberrant, arising below the level of the IMA origin just above the aortic bifurcation, supplying a portion of the lower pole. Duplicated right, superior dominant with calcified ostial plaque resulting in short segment stenosis of at least mild severity, patent distally. The more diminutive inferior right renal artery is aberrant arising below the level of the IMA, supplying a portion of the lower pole. IMA: Ostial stenosis of possible hemodynamic significance. Tandem heavily calcified lesion approximately 2.5 cm from the origin resulting in stenosis of probable hemodynamic significance. Inflow: Moderate calcified atheromatous plaque about the bifurcations of both common iliac arteries. No aneurysm, dissection, or  stenosis. Proximal Outflow: Bilateral common femoral and visualized portions of the superficial and profunda femoral arteries are patent without evidence of aneurysm, dissection, vasculitis or significant stenosis. Veins: No obvious venous abnormality within the limitations of this arterial phase study. Retroaortic left renal vein, an anatomic variant. Review of the MIP images confirms the above findings. NON-VASCULAR Lower chest: Small bilateral pleural effusions, and dependent atelectasis/consolidation posteriorly in both lung bases, increased since previous. Hepatobiliary: Benign cyst in hepatic for segment 4B, present since 08/15/2010. No new liver lesion or biliary ductal dilatation. Vicarious excretion of contrast material into the gallbladder obscuring the small calculi seen on the prior study. Pancreas: Unremarkable. No pancreatic ductal dilatation or surrounding inflammatory changes. Spleen: Normal in size without focal abnormality. Stable accessory splenule. Adrenals/Urinary Tract: Adrenal glands unremarkable. 6.7 cm cyst from the upper pole left kidney, slowly enlarging since 2012. 5.5 cm exophytic cyst from the mid right kidney, and 3.2 cm cyst from the right lower pole, present since 2012. 5 mm calcification in the lower pole right renal collecting system. 1 mm calcification in the left ureter at the level of the SI joint with stable mild pelvicaliectasis. Stomach/Bowel: Stomach is decompressed. Appendix not discretely identified no pericecal inflammatory/edematous change. The colon is nondilated. There is circumferential wall thickening in the distal transverse colon, splenic flexure, and descending colon. Multiple sigmoid diverticula without abscess. The rectum is nondistended. Lymphatic: Subcentimeter left para-aortic lymph node. No pelvic or mesenteric adenopathy. Reproductive: Mild prostate enlargement. Other: Small volume ascites in the lower abdomen, slightly increased since previous. No free  air. Musculoskeletal: Lower lumbar spondylitic changes most marked L4-5. Orthopedic pins across the right femoral neck. No acute fracture or worrisome bone lesion. IMPRESSION: 1. Persistent colitis involving distal transverse colon, splenic flexure, and descending colon. 2. Mild lower abdominal ascites, slightly increased on a. 3. 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 The celiac axis and superior mesenteric artery remain widely patent however, which typically allows adequate collateral perfusion. 4. Increase in small bilateral pleural effusions and dependent atelectasis/consolidation in both lung bases since previous study. 5. Sigmoid diverticulosis. 6. 1 mm mid left ureteral calculus with stable left renal pelvicaliectasis. 7. Right nephrolithiasis. 8. Bilateral renal artery ostial stenosis of possible hemodynamic significance. Aortic Atherosclerosis (ICD10-I70.0). Electronically Signed   By: Lucrezia Europe M.D.   On: 05/26/2020 08:35    Orson Eva, DO  [facility]  If  7PM-7AM, please contact night-coverage [email or web address] Password [email or web address] 05/29/2020, 6:10 PM   LOS: 7 days

## 2020-05-29 NOTE — Telephone Encounter (Signed)
Patient is in the hospital and Jeffrey Sherman) daughter-in-law called stating that he has been there for a week and not sure if he will be able to keep his appointment with Dr. Aline Brochure. He is a postop for hip DOS 10/1/2, she is asking if his appointment on Wednesday be canceled or what. States she doesn't think he will be out of hospital by then

## 2020-05-30 DIAGNOSIS — K529 Noninfective gastroenteritis and colitis, unspecified: Secondary | ICD-10-CM | POA: Diagnosis not present

## 2020-05-30 LAB — CBC
HCT: 37.9 % — ABNORMAL LOW (ref 39.0–52.0)
Hemoglobin: 11.9 g/dL — ABNORMAL LOW (ref 13.0–17.0)
MCH: 25.5 pg — ABNORMAL LOW (ref 26.0–34.0)
MCHC: 31.4 g/dL (ref 30.0–36.0)
MCV: 81.3 fL (ref 80.0–100.0)
Platelets: 216 10*3/uL (ref 150–400)
RBC: 4.66 MIL/uL (ref 4.22–5.81)
RDW: 15 % (ref 11.5–15.5)
WBC: 10.5 10*3/uL (ref 4.0–10.5)
nRBC: 0 % (ref 0.0–0.2)

## 2020-05-30 LAB — BASIC METABOLIC PANEL
Anion gap: 8 (ref 5–15)
BUN: 10 mg/dL (ref 8–23)
CO2: 28 mmol/L (ref 22–32)
Calcium: 8.6 mg/dL — ABNORMAL LOW (ref 8.9–10.3)
Chloride: 103 mmol/L (ref 98–111)
Creatinine, Ser: 0.85 mg/dL (ref 0.61–1.24)
GFR, Estimated: >60 mL/min (ref >60)
Glucose, Bld: 126 mg/dL — ABNORMAL HIGH (ref 70–99)
Potassium: 2.9 mmol/L — ABNORMAL LOW (ref 3.5–5.1)
Sodium: 139 mmol/L (ref 135–145)

## 2020-05-30 LAB — MAGNESIUM: Magnesium: 1.9 mg/dL (ref 1.7–2.4)

## 2020-05-30 MED ORDER — POTASSIUM CHLORIDE CRYS ER 20 MEQ PO TBCR
40.0000 meq | EXTENDED_RELEASE_TABLET | Freq: Once | ORAL | Status: AC
  Administered 2020-05-30: 09:00:00 40 meq via ORAL
  Filled 2020-05-30: qty 2

## 2020-05-30 MED ORDER — LOPERAMIDE HCL 2 MG PO CAPS
2.0000 mg | ORAL_CAPSULE | Freq: Two times a day (BID) | ORAL | Status: AC
  Administered 2020-05-30 (×2): 2 mg via ORAL
  Filled 2020-05-30 (×2): qty 1

## 2020-05-30 NOTE — Progress Notes (Addendum)
PROGRESS NOTE  Jeffrey Sherman [id number] DOB: [birth year] DOA: 05/22/2020 PCP: Celene Squibb, MD   Brief History: 86 y.o.malewith medical history significant forprostate cancer, dementia, hypertension, peptic ulcer disease. [facility]Patient's symptoms started12/12/21, with nausea vomiting and diarrhea. There was some bright red blood in his stool. He also reports vomiting.Patient reports abdominal pain was very severe,and all over his abdomen particularly left side of abdomen.  ED Course:, Tachycardic to 116, intermittent tachypnea to 24, blood pressure systolic [id number] to [id number], O2 sats greater than 91% on room air. Potassium 3.4. WBC 29. Abd CT-consistent with colitis involving the distal transverse, splenic flexure and descending colon-infectious versus hischemic etiology. Portable chest x-ray unremarkable. IV cefepime and metronidazolewas initially started. Hospitalist to admit for likely infectious colitis.Cefepime was changed to ceftriaxone. GI was consulted to assist The patient was placed on bowel rest and diet slowly advanced.  His hospitalization has been prolonged due to slow improvement and some clinical regression when his diet has been advanced.  Assessment/Plan:   Sepsis secondary to colitis -Patient presented with fever, leukocytosis, tachycardia and tachypnea -Abdominal CT indicated distal transverse splenic flexure and sigmoid colon colitis -Stool for C. difficile found to be negative -GI pathogen panel neg -Patient is currently on intravenous antibiotics--finished 8 days 05/30/20 -suspect ischemic colitis vs infectious -continuefull liquid -appreciate GI consult>>>CTA abd/pelvis -12/16 CTA abd/pelvis-persistent colitis; tandem stenosis of IMA but patent celiac axis and SMA; Colon is nondilated -patient continues to have loose stools--abd less distended and less tender  with BMs -WBC improved from 29.7>>10.5  Hematochezia/Ischemic Colitis -due to ischemic colitis vs infectious -no hematochezia on 12/18-12/20 -drop in Hb due to dilution -discussed with GI, Dr. Laural Golden 05/30/20--possible flex sig 12/22  Pleural Effusions -lasix IV x 2 -stable on RA -due to third-spacing  Hypertension -Continued on diltiazem and hydralazine -Lisinopril currently on hold since he was exposed to contrastand RAS -addedmetoprolol  Prostate cancer -Continue follow-up with urology -Continue on alfuzosin  Dementiawithout behavioral disturbance -continue Namenda  Hypokalemia -replete -check mag--1.8       Status is: Inpatient  Remains inpatient appropriate because:IV treatments appropriate due to intensity of illness or inability to take PO   Dispo: The patient is from:SNF Anticipated d/c is to:SNF Anticipated d/c date is: 1 day Patient currently is not medically stable to d/c.        Family Communication:daughter-in-law updated bedside 12/19  Consultants:none  Code Status: DNR  DVT Prophylaxis: SCDs   Procedures: As Listed in Progress Note Above  Antibiotics: Ceftriaxone 12/14>> Cefepime 12/13>>12/14 metronidazole 12/13>>     Subjective: Patient had 6BM since 11pm yesterday.  Denies f/c, cp, sob, hemaotcheiza.  abd less tender today  Objective: Vitals:   05/29/20 1409 05/29/20 2032 05/30/20 0433 05/30/20 1442  BP: (!) 153/71 (!) 154/73 (!) 170/71 (!) 150/71  Pulse: 70 69 64 70  Resp: 18 18 20 19   Temp: 98.3 F (36.8 C) 97.9 F (36.6 C) 98.1 F (36.7 C) 97.9 F (36.6 C)  TempSrc: Oral  Oral   SpO2: 98% 94% 92% 94%  Weight:      Height:        Intake/Output Summary (Last 24 hours) at 05/30/2020 1638 Last data filed at 05/30/2020 1300 Gross per 24 hour  Intake 1560 ml  Output 2200 ml  Net -640 ml   Weight change:  Exam:   General:  Pt  is alert, follows commands  appropriately, not in acute distress  HEENT: No icterus, No thrush, No neck mass, [place]/AT  Cardiovascular: RRR, S1/S2, no rubs, no gallops  Respiratory: CTA bilaterally, no wheezing, no crackles, no rhonchi  Abdomen: Soft/+BS, RLQ, LLQ tender, non distended, no guarding  Extremities: trace LE edema, No lymphangitis, No petechiae, No rashes, no synovitis   Data Reviewed: I have personally reviewed following labs and imaging studies Basic Metabolic Panel: Recent Labs  Lab 05/25/20 0636 05/26/20 0607 05/27/20 0452 05/28/20 0504 05/29/20 0510 05/30/20 0534  NA 137 136 138 137 137 139  K 3.4* 3.1* 3.0* 3.0* 2.9* 2.9*  CL 105 102 104 103 102 103  CO2 26 25 26 28 27 28   GLUCOSE 109* 138* 123* 132* 127* 126*  BUN 24* 14 11 10 10 10   CREATININE 0.86 0.80 0.78 0.82 0.78 0.85  CALCIUM 8.9 8.8* 8.8* 8.7* 8.8* 8.6*  MG 1.9 1.8 1.8  --  1.9 1.9   Liver Function Tests: No results for input(s): AST, ALT, ALKPHOS, BILITOT, PROT, ALBUMIN in the last 168 hours. No results for input(s): LIPASE, AMYLASE in the last 168 hours. No results for input(s): AMMONIA in the last 168 hours. Coagulation Profile: No results for input(s): INR, PROTIME in the last 168 hours. CBC: Recent Labs  Lab 05/26/20 0607 05/27/20 0452 05/28/20 0504 05/29/20 0510 05/30/20 0534  WBC 17.2* 13.6* 10.9* 9.8 10.5  HGB 12.4* 11.6* 11.8* 11.8* 11.9*  HCT 38.9* 36.7* 36.8* 37.6* 37.9*  MCV 81.9 82.3 81.8 81.7 81.3  PLT 231 235 227 230 216   Cardiac Enzymes: No results for input(s): CKTOTAL, CKMB, CKMBINDEX, TROPONINI in the last 168 hours. BNP: Invalid input(s): POCBNP CBG: No results for input(s): GLUCAP in the last 168 hours. HbA1C: No results for input(s): HGBA1C in the last 72 hours. Urine analysis:    Component Value Date/Time   COLORURINE YELLOW 05/22/2020 1111   APPEARANCEUR CLEAR 05/22/2020 1111   APPEARANCEUR Clear 02/15/2020 1321   LABSPEC 1.016 05/22/2020 1111    PHURINE 5.0 05/22/2020 1111   GLUCOSEU 150 (A) 05/22/2020 1111   HGBUR NEGATIVE 05/22/2020 1111   BILIRUBINUR NEGATIVE 05/22/2020 1111   BILIRUBINUR Negative 02/15/2020 1321   KETONESUR NEGATIVE 05/22/2020 1111   PROTEINUR >=300 (A) 05/22/2020 1111   UROBILINOGEN negative (A) 10/19/2019 0958   UROBILINOGEN 0.2 05/08/2014 2045   NITRITE NEGATIVE 05/22/2020 1111   LEUKOCYTESUR NEGATIVE 05/22/2020 1111   Sepsis Labs: @LABRCNTIP (procalcitonin:4,lacticidven:4) ) Recent Results (from the past 240 hour(s))  Culture, blood (routine x 2)     Status: None   Collection Time: 05/22/20  7:45 AM   Specimen: BLOOD  Result Value Ref Range Status   Specimen Description BLOOD RIGHT ANTECUBITAL DRAWN BY RN  Final   Special Requests   Final    BOTTLES DRAWN AEROBIC AND ANAEROBIC Blood Culture results may not be optimal due to an excessive volume of blood received in culture bottles   Culture   Final    NO GROWTH 5 DAYS Performed at [facility], [address], [place], [place] [address]    Report Status 05/27/2020 FINAL  Final  Culture, blood (routine x 2)     Status: None   Collection Time: 05/22/20  7:50 AM   Specimen: BLOOD  Result Value Ref Range Status   Specimen Description BLOOD LEFT ANTECUBITAL DRAWN BY RN  Final   Special Requests   Final    BOTTLES DRAWN AEROBIC AND ANAEROBIC Blood Culture results may not be optimal due to an excessive  volume of blood received in culture bottles   Culture   Final    NO GROWTH 5 DAYS Performed at [facility], [address], [place], [place] [address]    Report Status 05/27/2020 FINAL  Final  Culture, Urine     Status: Abnormal   Collection Time: 05/22/20 11:12 AM   Specimen: Urine, Clean Catch  Result Value Ref Range Status   Specimen Description   Final    URINE, CLEAN CATCH Performed at [facility], [address], [place], [place] [address]    Special Requests   Final    NONE Performed at [facility], [address], [place], [place]  [address]    Culture (A)  Final    <10,000 COLONIES/mL INSIGNIFICANT GROWTH Performed at [facility] [address], [place], [place] [address]    Report Status 05/23/2020 FINAL  Final  Blood Culture (routine x 2)     Status: None   Collection Time: 05/22/20  6:18 PM   Specimen: BLOOD  Result Value Ref Range Status   Specimen Description BLOOD RIGHT ANTECUBITAL  Final   Special Requests   Final    BOTTLES DRAWN AEROBIC AND ANAEROBIC Blood Culture adequate volume   Culture   Final    NO GROWTH 5 DAYS Performed at [facility], [address] [place], [place] [address]    Report Status 05/27/2020 FINAL  Final  Blood Culture (routine x 2)     Status: None   Collection Time: 05/22/20  6:23 PM   Specimen: BLOOD  Result Value Ref Range Status   Specimen Description BLOOD BLOOD RIGHT WRIST  Final   Special Requests   Final    BOTTLES DRAWN AEROBIC AND ANAEROBIC Blood Culture adequate volume   Culture   Final    NO GROWTH 5 DAYS Performed at [facility], [address], [place], [place] [address]    Report Status 05/27/2020 FINAL  Final  C Difficile Quick Screen w PCR reflex     Status: None   Collection Time: 05/22/20 11:55 PM   Specimen: STOOL  Result Value Ref Range Status   C Diff antigen NEGATIVE NEGATIVE Final   C Diff toxin NEGATIVE NEGATIVE Final   C Diff interpretation No C. difficile detected.  Final    Comment: Performed at [facility], [address] [place], [place] [address]  Resp Panel by RT-PCR (Flu A&B, Covid) Nasopharyngeal Swab     Status: None   Collection Time: 05/23/20  1:00 AM   Specimen: Nasopharyngeal Swab; Nasopharyngeal(NP) swabs in vial transport medium  Result Value Ref Range Status   SARS Coronavirus 2 by RT PCR NEGATIVE NEGATIVE Final    Comment: (NOTE) SARS-CoV-2 target nucleic acids are NOT DETECTED.  The SARS-CoV-2 RNA is generally detectable in upper respiratory specimens during the acute phase of infection. The lowest concentration of  SARS-CoV-2 viral copies this assay can detect is 138 copies/mL. A negative result does not preclude SARS-Cov-2 infection and should not be used as the sole basis for treatment or other patient management decisions. A negative result may occur with  improper specimen collection/handling, submission of specimen other than nasopharyngeal swab, presence of viral mutation(s) within the areas targeted by this assay, and inadequate number of viral copies(<138 copies/mL). A negative result must be combined with clinical observations, patient history, and epidemiological information. The expected result is Negative.  Fact Sheet for Patients:  EntrepreneurPulse.com.au  Fact Sheet for Healthcare Providers:  IncredibleEmployment.be  This test  is no t yet approved or cleared by the [place] and  has been authorized for detection and/or diagnosis of SARS-CoV-2 by FDA under an Emergency Use Authorization (EUA). This EUA will remain  in effect (meaning this test can be used) for the duration of the COVID-19 declaration under Section 564(b)(1) of the Act, 21 U.S.C.section 360bbb-3(b)(1), unless the authorization is terminated  or revoked sooner.       Influenza A by PCR NEGATIVE NEGATIVE Final   Influenza B by PCR NEGATIVE NEGATIVE Final    Comment: (NOTE) The Xpert Xpress SARS-CoV-2/FLU/RSV plus assay is intended as an aid in the diagnosis of influenza from Nasopharyngeal swab specimens and should not be used as a sole basis for treatment. Nasal washings and aspirates are unacceptable for Xpert Xpress SARS-CoV-2/FLU/RSV testing.  Fact Sheet for Patients: EntrepreneurPulse.com.au  Fact Sheet for Healthcare Providers: IncredibleEmployment.be  This test is not yet approved or cleared by the [place] FDA and has been authorized for detection and/or diagnosis of SARS-CoV-2 by FDA under an Emergency Use  Authorization (EUA). This EUA will remain in effect (meaning this test can be used) for the duration of the COVID-19 declaration under Section 564(b)(1) of the Act, 21 U.S.C. section 360bbb-3(b)(1), unless the authorization is terminated or revoked.  Performed at [facility], [address] [place], [place] [address]   MRSA PCR Screening     Status: None   Collection Time: 05/23/20  2:53 PM   Specimen: Nasal Mucosa; Nasopharyngeal  Result Value Ref Range Status   MRSA by PCR NEGATIVE NEGATIVE Final    Comment:        The GeneXpert MRSA Assay (FDA approved for NASAL specimens only), is one component of a comprehensive MRSA colonization surveillance program. It is not intended to diagnose MRSA infection nor to guide or monitor treatment for MRSA infections. Performed at [facility], [address], [place], [place] [address]   Gastrointestinal Panel by PCR , Stool     Status: None   Collection Time: 05/26/20  1:40 AM   Specimen: Stool  Result Value Ref Range Status   Campylobacter species NOT DETECTED NOT DETECTED Final   Plesimonas shigelloides NOT DETECTED NOT DETECTED Final   Salmonella species NOT DETECTED NOT DETECTED Final   Yersinia enterocolitica NOT DETECTED NOT DETECTED Final   Vibrio species NOT DETECTED NOT DETECTED Final   Vibrio cholerae NOT DETECTED NOT DETECTED Final   Enteroaggregative E coli (EAEC) NOT DETECTED NOT DETECTED Final   Enteropathogenic E coli (EPEC) NOT DETECTED NOT DETECTED Final   Enterotoxigenic E coli (ETEC) NOT DETECTED NOT DETECTED Final   Shiga like toxin producing E coli (STEC) NOT DETECTED NOT DETECTED Final   Shigella/Enteroinvasive E coli (EIEC) NOT DETECTED NOT DETECTED Final   Cryptosporidium NOT DETECTED NOT DETECTED Final   Cyclospora cayetanensis NOT DETECTED NOT DETECTED Final   Entamoeba histolytica NOT DETECTED NOT DETECTED Final   Giardia lamblia NOT DETECTED NOT DETECTED Final   Adenovirus F40/41 NOT DETECTED NOT DETECTED  Final   Astrovirus NOT DETECTED NOT DETECTED Final   Norovirus GI/GII NOT DETECTED NOT DETECTED Final   Rotavirus A NOT DETECTED NOT DETECTED Final   Sapovirus (I, II, IV, and V) NOT DETECTED NOT DETECTED Final    Comment: Performed at [facility], [address] [place], [place] [address]     Scheduled Meds: . alfuzosin  10 mg Oral Q breakfast  . diltiazem  360 mg Oral Daily  . hydrALAZINE  100 mg Oral TID  . loperamide  2 mg Oral BID  . memantine  10 mg Oral BID  . metoprolol tartrate  25 mg Oral BID  . venlafaxine XR  75 mg Oral Q breakfast   Continuous Infusions:  Procedures/Studies: CT ABDOMEN PELVIS W CONTRAST  Result Date: 05/22/2020 CLINICAL DATA:  Abdominal pain, fever, rectal bleeding EXAM: CT ABDOMEN AND PELVIS WITH CONTRAST TECHNIQUE: Multidetector CT imaging of the abdomen and pelvis was performed using the standard protocol following bolus administration of intravenous contrast. CONTRAST:  OMNIPAQUE IOHEXOL 300 MG/ML  SOLN COMPARISON:  May 2019 FINDINGS: Lower chest: Bibasilar atelectasis/scarring. Hepatobiliary: Small cyst of the left hepatic lobe. Few small layering gallstones. No biliary dilatation. Pancreas: Unremarkable. Spleen: Unremarkable. Adrenals/Urinary Tract: Adrenal glands are unremarkable. Bilateral renal cysts. 3 mm nonobstructing calculus of the lower pole the right kidney. Small bladder diverticula. Stomach/Bowel: Stomach is within normal limits. Distal colonic diverticulosis. There is wall thickening along the distal transverse colon, splenic flexure, and descending colon. Vascular/Lymphatic: Aortic atherosclerosis. No enlarged lymph nodes. Reproductive: Unremarkable. Other: Small volume abdominopelvic ascites. No acute abnormality of the abdominal wall. Musculoskeletal: Right femoral fixation with associated streak artifact. Degenerative changes of the lumbar spine. No acute osseous abnormality. IMPRESSION: Findings consistent with colitis  involving the distal transverse colon, splenic flexure, and descending colon. This is likely ischemic or infectious in etiology. Diverticulosis is present but mostly distal to the area of thickening and diverticulitis is therefore less likely. 3 mm nonobstructing left renal calculus. Small bladder diverticula. Electronically Signed   By: Guadlupe Spanish M.D.   On: 05/22/2020 20:12   DG Pelvis Portable  Result Date: 05/22/2020 CLINICAL DATA:  Fall, pelvic injury EXAM: PORTABLE PELVIS 1-2 VIEWS COMPARISON:  None. FINDINGS: ORIF of a right subcapital femoral neck fracture has been performed utilizing 4 partially-threaded screws with incomplete healing of the a fracture. No acute fracture or dislocation. Mild bilateral hip joint space narrowing is present in keeping with at least mild bilateral degenerative arthritis. Contrast is seen within the bladder lumen. Small bladder diverticulum present as well as trabeculation of the bladder wall is present in keeping with changes of probable chronic bladder outlet obstruction. Defect involving the bladder base likely represents mass effect from a hypertrophied central prostatic lobe. IMPRESSION: Status post right hip ORIF.  No acute fracture or dislocation. Findings in keeping with changes of chronic bladder outlet obstruction, possibly related to central prostatic hypertrophy. Electronically Signed   By: Helyn Numbers MD   On: 05/22/2020 23:07   DG Chest Port 1 View  Result Date: 05/22/2020 CLINICAL DATA:  Questionable sepsis - evaluate for abnormality Patient reports dizziness leading to fall and abdominal pain. EXAM: PORTABLE CHEST 1 VIEW COMPARISON:  Radiograph 03/12/2020 FINDINGS: Improved cardiomegaly from prior. Unchanged mediastinal contours with aortic atherosclerosis and tortuosity. Chronic interstitial coarsening. Streaky atelectasis at the right lung base. No confluent consolidation. No pulmonary edema or large pleural effusion. No pneumothorax. No acute  osseous abnormalities are seen. IMPRESSION: 1. Mild right basilar atelectasis. 2. Improved cardiomegaly from prior. Stable aortic atherosclerosis and tortuosity. Electronically Signed   By: Narda Rutherford M.D.   On: 05/22/2020 18:50   CT Angio Abd/Pel w/ and/or w/o  Result Date: 05/26/2020 CLINICAL DATA:  Left abdominal pain, diarrhea, suspicion of recurrent colitis, rule out mesenteric ischemia EXAM: CTA ABDOMEN AND PELVIS WITH CONTRAST TECHNIQUE: Multidetector CT imaging of the abdomen and pelvis was performed using the standard protocol during bolus administration of intravenous contrast. Multiplanar reconstructed images  and MIPs were obtained and reviewed to evaluate the vascular anatomy. CONTRAST:  113mL OMNIPAQUE IOHEXOL 350 MG/ML SOLN COMPARISON:  05/22/2020 and previous FINDINGS: VASCULAR Aorta: Moderate calcified atheromatous plaque. No aneurysm, dissection, or stenosis. Celiac: Calcified ostial plaque without stenosis. Mild narrowing at the median arcuate ligament of the diaphragm, with minimal poststenotic dilatation, patent distally. SMA: Widely patent.  Minimal plaque.  Classic distal branch anatomy. Renals: Duplicated left, superior dominant with calcified ostial plaque, no stenosis. The inferior left renal artery is aberrant, arising below the level of the IMA origin just above the aortic bifurcation, supplying a portion of the lower pole. Duplicated right, superior dominant with calcified ostial plaque resulting in short segment stenosis of at least mild severity, patent distally. The more diminutive inferior right renal artery is aberrant arising below the level of the IMA, supplying a portion of the lower pole. IMA: Ostial stenosis of possible hemodynamic significance. Tandem heavily calcified lesion approximately 2.5 cm from the origin resulting in stenosis of probable hemodynamic significance. Inflow: Moderate calcified atheromatous plaque about the bifurcations of both common iliac  arteries. No aneurysm, dissection, or stenosis. Proximal Outflow: Bilateral common femoral and visualized portions of the superficial and profunda femoral arteries are patent without evidence of aneurysm, dissection, vasculitis or significant stenosis. Veins: No obvious venous abnormality within the limitations of this arterial phase study. Retroaortic left renal vein, an anatomic variant. Review of the MIP images confirms the above findings. NON-VASCULAR Lower chest: Small bilateral pleural effusions, and dependent atelectasis/consolidation posteriorly in both lung bases, increased since previous. Hepatobiliary: Benign cyst in hepatic for segment 4B, present since 08/15/2010. No new liver lesion or biliary ductal dilatation. Vicarious excretion of contrast material into the gallbladder obscuring the small calculi seen on the prior study. Pancreas: Unremarkable. No pancreatic ductal dilatation or surrounding inflammatory changes. Spleen: Normal in size without focal abnormality. Stable accessory splenule. Adrenals/Urinary Tract: Adrenal glands unremarkable. 6.7 cm cyst from the upper pole left kidney, slowly enlarging since 2012. 5.5 cm exophytic cyst from the mid right kidney, and 3.2 cm cyst from the right lower pole, present since 2012. 5 mm calcification in the lower pole right renal collecting system. 1 mm calcification in the left ureter at the level of the SI joint with stable mild pelvicaliectasis. Stomach/Bowel: Stomach is decompressed. Appendix not discretely identified no pericecal inflammatory/edematous change. The colon is nondilated. There is circumferential wall thickening in the distal transverse colon, splenic flexure, and descending colon. Multiple sigmoid diverticula without abscess. The rectum is nondistended. Lymphatic: Subcentimeter left para-aortic lymph node. No pelvic or mesenteric adenopathy. Reproductive: Mild prostate enlargement. Other: Small volume ascites in the lower abdomen, slightly  increased since previous. No free air. Musculoskeletal: Lower lumbar spondylitic changes most marked L4-5. Orthopedic pins across the right femoral neck. No acute fracture or worrisome bone lesion. IMPRESSION: 1. Persistent colitis involving distal transverse colon, splenic flexure, and descending colon. 2. Mild lower abdominal ascites, slightly increased on a. 3. 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 The celiac axis and superior mesenteric artery remain widely patent however, which typically allows adequate collateral perfusion. 4. Increase in small bilateral pleural effusions and dependent atelectasis/consolidation in both lung bases since previous study. 5. Sigmoid diverticulosis. 6. 1 mm mid left ureteral calculus with stable left renal pelvicaliectasis. 7. Right nephrolithiasis. 8. Bilateral renal artery ostial stenosis of possible hemodynamic significance. Aortic Atherosclerosis (ICD10-I70.0). Electronically Signed   By: Lucrezia Europe M.D.   On: 05/26/2020 08:35    Orson Eva, DO  [facility]  If 7PM-7AM, please contact night-coverage [email or web address] Password [facility] 05/30/2020, 4:38 PM   LOS: 8 days

## 2020-05-30 NOTE — Progress Notes (Signed)
Subjective: States he has had 6 BMs since 11PM. Still watery. No blood in the stool. Mild abdominal pain prior to a BM that resolves thereafter. Intermittent discomfort with gas. Overall, feels about the same as yesterday but better compared to a couple of days ago as he was having about 11 BMs. No nausea or vomiting. Tolerated clear liquids yesterday.   Asking if he is going to need a colonoscopy. States he doesn't think he would tolerate drinking "the gallon of stuff". Discussed if needed, we could use MiraLAX prep. However, we would likely pursue flex sig rather than colonoscopy.  Confirmed with nursing staff. No overt GI bleeding. 4 documented stools overnight. 1 this morning. Continue to be loose.   Objective: Vital signs in last 24 hours: Temp:  [97.9 F (36.6 C)-98.3 F (36.8 C)] 98.1 F (36.7 C) (12/21 0433) Pulse Rate:  [64-70] 64 (12/21 0433) Resp:  [18-20] 20 (12/21 0433) BP: (153-170)/(71-73) 170/71 (12/21 0433) SpO2:  [92 %-98 %] 92 % (12/21 0433) Last BM Date: 05/29/20 General:   Alert and oriented, pleasant, NAD.  Head:  Normocephalic and atraumatic. Eyes:  No icterus, sclera clear. Conjuctiva pink.  Abdomen:  Obese, soft, non-distended. Very Mild TTP in LLQ. Patient states this is much improved. +BS in all 4 quadrants. No rebound or guarding. No masses appreciated  Extremities:  With trace edema in the extremities which patient reports in chronic.  Neurologic: Alert and  oriented x4;  grossly normal neurologically. Skin:  Warm and dry, intact without significant lesions.  Psych:  Normal mood and affect.  Intake/Output from previous day: 12/20 0701 - 12/21 0700 In: 1800 [P.O.:1800] Out: 2200 [Urine:2200] Intake/Output this shift: No intake/output data recorded.  Lab Results: Recent Labs    05/28/20 0504 05/29/20 0510 05/30/20 0534  WBC 10.9* 9.8 10.5  HGB 11.8* 11.8* 11.9*  HCT 36.8* 37.6* 37.9*  PLT 227 230 216   BMET Recent Labs    05/28/20 0504  05/29/20 0510 05/30/20 0534  NA 137 137 139  K 3.0* 2.9* 2.9*  CL 103 102 103  CO2 28 27 28   GLUCOSE 132* 127* 126*  BUN 10 10 10   CREATININE 0.82 0.78 0.85  CALCIUM 8.7* 8.8* 8.6*   Assessment: 86 year old male admitted 12/13 with abdominal pain, vomiting, diarrhea, rectal bleeding, with CT findings of colitis involving distal transverse colon, splenic flexure, and descending colon. CTA 12/16 with persistent colitis, proximal stenoses of IMA but celiac and SMA remain widely patent. Cdiff and GI path panel negative. Suspicious for ischemic colitis.   Currently on empiric antibiotics with IV ceftriaxone and Flagyl, started on 12/14.  Leukocytosis resolved.   Today, patient reports having about 6 BMs between 11PM and 9AM. Stool output increased compared to yesterday as patient reported only have 2 BMs overnight yesterday; however, improved compared to 2 days ago when he reported having 11 BMs overnight. No further rectal bleeding and hemoglobin has remained stable.  Reports very mild abdominal pain prior to BMs that resolves thereafter, overall significantly improved compared to admission.  Tolerating clear liquids well.  On exam, abdomen is obese/full but is soft, positive bowel sounds, very mild tenderness palpation in the left lower quadrant, no rebound or guarding.  Had previously discussed possible flex sig, but held off thus far due to clinical improvement. Overall, his clinical picture seems improved although stool output did increase somewhat overnight. Query whether this may also be influenced by antibiotics vs persistent colitis. Would recommend continuing to monitor over  the next 24 hours and if he doesn't continue to improve, will need to consider flex sig in the morning.     Plan: Continue clear liquids for now as we may need to consider flex sig if diarrhea is persistent tomorrow.  NPO at midnight.  Discontinue antibiotics as he has completed a 7 day course at this point.   Potassium repletion per hospitalist Reassess in the am.    LOS: 8 days    05/30/2020, 9:14 AM   Aliene Altes, PA-C [facility]

## 2020-05-30 NOTE — Plan of Care (Signed)

## 2020-05-31 ENCOUNTER — Ambulatory Visit: Payer: Medicare Other | Admitting: Orthopedic Surgery

## 2020-05-31 DIAGNOSIS — G2581 Restless legs syndrome: Secondary | ICD-10-CM

## 2020-05-31 DIAGNOSIS — M25551 Pain in right hip: Secondary | ICD-10-CM

## 2020-05-31 DIAGNOSIS — F339 Major depressive disorder, recurrent, unspecified: Secondary | ICD-10-CM

## 2020-05-31 DIAGNOSIS — G8929 Other chronic pain: Secondary | ICD-10-CM

## 2020-05-31 LAB — CBC WITH DIFFERENTIAL/PLATELET
Abs Immature Granulocytes: 0.23 10*3/uL — ABNORMAL HIGH (ref 0.00–0.07)
Basophils Absolute: 0.1 10*3/uL (ref 0.0–0.1)
Basophils Relative: 1 %
Eosinophils Absolute: 0.1 10*3/uL (ref 0.0–0.5)
Eosinophils Relative: 1 %
HCT: 40.5 % (ref 39.0–52.0)
Hemoglobin: 12.9 g/dL — ABNORMAL LOW (ref 13.0–17.0)
Immature Granulocytes: 2 %
Lymphocytes Relative: 18 %
Lymphs Abs: 1.7 10*3/uL (ref 0.7–4.0)
MCH: 26.1 pg (ref 26.0–34.0)
MCHC: 31.9 g/dL (ref 30.0–36.0)
MCV: 82 fL (ref 80.0–100.0)
Monocytes Absolute: 0.9 10*3/uL (ref 0.1–1.0)
Monocytes Relative: 9 %
Neutro Abs: 6.7 10*3/uL (ref 1.7–7.7)
Neutrophils Relative %: 69 %
Platelets: 240 10*3/uL (ref 150–400)
RBC: 4.94 MIL/uL (ref 4.22–5.81)
RDW: 15.2 % (ref 11.5–15.5)
WBC: 9.7 10*3/uL (ref 4.0–10.5)
nRBC: 0 % (ref 0.0–0.2)

## 2020-05-31 LAB — BASIC METABOLIC PANEL
Anion gap: 8 (ref 5–15)
BUN: 9 mg/dL (ref 8–23)
CO2: 28 mmol/L (ref 22–32)
Calcium: 9 mg/dL (ref 8.9–10.3)
Chloride: 103 mmol/L (ref 98–111)
Creatinine, Ser: 0.89 mg/dL (ref 0.61–1.24)
GFR, Estimated: >60 mL/min (ref >60)
Glucose, Bld: 130 mg/dL — ABNORMAL HIGH (ref 70–99)
Potassium: 3.1 mmol/L — ABNORMAL LOW (ref 3.5–5.1)
Sodium: 139 mmol/L (ref 135–145)

## 2020-05-31 MED ORDER — POTASSIUM CHLORIDE 10 MEQ/100ML IV SOLN
10.0000 meq | INTRAVENOUS | Status: AC
  Administered 2020-05-31 (×4): 10 meq via INTRAVENOUS
  Filled 2020-05-31 (×4): qty 100

## 2020-05-31 MED ORDER — LOPERAMIDE HCL 2 MG PO CAPS
2.0000 mg | ORAL_CAPSULE | Freq: Three times a day (TID) | ORAL | Status: DC | PRN
  Administered 2020-06-01: 12:00:00 2 mg via ORAL
  Filled 2020-05-31: qty 1

## 2020-05-31 NOTE — TOC Progression Note (Signed)
Transition of Care [facility]) - Progression Note    Patient Details  Name: Jeffrey Sherman MRN: [id number] Date of Birth: 27-May-1930  Transition of Care [facility]) CM/SW Contact  Salome Arnt, [place] Phone Number: 05/31/2020, 10:23 AM  Clinical Narrative:  LCSW updated [facility] on pt. Possible d/c tomorrow. Pt will need COVID test prior to transfer to SNF.       Expected Discharge Plan: Long Term Nursing Home Barriers to Discharge: Continued Medical Work up  Expected Discharge Plan and Services Expected Discharge Plan: [place] In-house Referral: Clinical Social Work   Post Acute Care Choice: Resumption of Svcs/PTA Provider Living arrangements for the past 2 months: [place]                                       Social Determinants of Health (SDOH) Interventions    Readmission Risk Interventions Readmission Risk Prevention Plan 05/24/2020  Transportation Screening Complete  Medication Review Press photographer) [place] Complete  Some recent data might be hidden

## 2020-05-31 NOTE — Progress Notes (Signed)
Subjective: Feels good today. Denies abdominal pain. No stool since his dose of Imodium last night (and took another this morning). Denies any obvious hematochezia yesterday. No N/V. No other overt GI complaints. Is wondering if he may be able to go home soon. Is asking about being able to eat something.  Objective: Vital signs in last 24 hours: Temp:  [97.6 F (36.4 C)-97.9 F (36.6 C)] 97.6 F (36.4 C) (12/22 0520) Pulse Rate:  [70-97] 72 (12/22 0520) Resp:  [19-20] 20 (12/22 0520) BP: (150-187)/(71-99) 171/87 (12/22 0520) SpO2:  [94 %-96 %] 96 % (12/22 0520) Last BM Date: 05/30/20 General:   Alert and oriented, pleasant Head:  Normocephalic and atraumatic. Eyes:  No icterus, sclera clear. Conjuctiva pink.  Neck:  Supple, without thyromegaly or masses.  Heart:  S1, S2 present, no murmurs noted.  Lungs: Clear to auscultation bilaterally, without wheezing, rales, or rhonchi.  Abdomen:  Bowel sounds present, soft, non-tender, non-distended. No HSM or hernias noted. No rebound or guarding. No masses appreciated  Msk:  Symmetrical without gross deformities. Pulses:  Normal bilateral DP pulses noted. Extremities:  Without clubbing or edema. Neurologic:  Alert and  oriented x4;  grossly normal neurologically. Skin:  Warm and dry, intact without significant lesions.  Psych:  Alert and cooperative. Normal mood and affect.  Intake/Output from previous day: 12/21 0701 - 12/22 0700 In: 1320 [P.O.:1320] Out: 1100 [Urine:1100] Intake/Output this shift: No intake/output data recorded.  Lab Results: Recent Labs    05/29/20 0510 05/30/20 0534 05/31/20 0627  WBC 9.8 10.5 9.7  HGB 11.8* 11.9* 12.9*  HCT 37.6* 37.9* 40.5  PLT 230 216 240   BMET Recent Labs    05/29/20 0510 05/30/20 0534 05/31/20 0627  NA 137 139 139  K 2.9* 2.9* 3.1*  CL 102 103 103  CO2 27 28 28   GLUCOSE 127* 126* 130*  BUN 10 10 9   CREATININE 0.78 0.85 0.89  CALCIUM 8.8* 8.6* 9.0   LFT No results  for input(s): PROT, ALBUMIN, AST, ALT, ALKPHOS, BILITOT, BILIDIR, IBILI in the last 72 hours. PT/INR No results for input(s): LABPROT, INR in the last 72 hours. Hepatitis Panel No results for input(s): HEPBSAG, HCVAB, HEPAIGM, HEPBIGM in the last 72 hours.   Studies/Results: No results found.  Assessment: 86 year old male admitted 12/13 with abdominal pain, vomiting, diarrhea, rectal bleeding, with CT findings of colitis involving distal transverse colon, splenic flexure, and descending colon. CTA 12/16 with persistent colitis, proximal stenoses of IMA but celiac and SMA remain widely patent. Cdiff and GI path panel negative. Suspicious for ischemic colitis.Currently on empiric antibiotics with IV ceftriaxone and Flagyl, started on 12/14.  Leukocytosis resolved.   Yesterday the patient reported having about 6 BMs between 11PM and 9AM. Stool output increased compared to day prior as patient reported only have 2 BMs overnight then; however, improved compared to 3 days ago when he reported having 11 BMs overnight. No further rectal bleeding and hemoglobin has remained stable.  Yesterday reported very mild abdominal pain prior to BMs that resolveed thereafter, overall significantly improved compared to admission.  Tolerating clear liquids well yesterday and was improved overall on exam.  Had previously discussed possible flex sig, but held off thus far due to clinical improvement. Overall, his clinical picture seems improved although stool output did increase somewhat overnight and so there was a question as to possibly adding on a flexible sigmoidoscopy today pending re-evaluation this morning given some increased stool output yesterday.  This morning  she is improved. He received a dose of Imodium yesterday and this morning and has not had any loose stool since pm dose. States he feels good today. I thinking he will be able to go home soon. Query infectious colitis resolved with antibiotics and now  having diarrhea as antibiotic effect. No abdominal pain since yesterday. After discussing with the patient and Dr. Laural Golden we have elected to hold of on flexible sigmoidoscopy in favor of medical monitoring today. May be able to d/c home soon if he continues to be improved.  Plan: 1. Will order full liquid diet today, can advance as tolerated 2. Reassess in the morning for continued improvement 3. Will order Imodium tid prn 4. Monitor for any recurrent diarrhea 5. Monitor for any obvious GI bleed 6. Check labs (CBC, BMP) in the morning 7. Supportive measures   Thank you for allowing [place] to participate in the care of Jeffrey Hill, DNP, AGNP-C Adult & Gerontological Nurse Practitioner [facility]     LOS: 9 days    05/31/2020, 10:53 AM

## 2020-05-31 NOTE — Progress Notes (Signed)
PROGRESS NOTE   Jeffrey Sherman  [id number] DOB: May 28, 1930 DOA: 05/22/2020 PCP: Celene Squibb, MD   Chief Complaint  Patient presents with  . Rectal Bleeding    Brief Admission History:  86 y.o.malewith medical history significant forprostate cancer, dementia, hypertension, peptic ulcer disease.  [facility]Patient's symptoms started12/12/21, with nausea vomiting and diarrhea. There was some bright red blood in his stool. He also reports vomiting.Patient reports abdominal pain was very severe,and all over his abdomen particularly left side of abdomen.  Assessment & Plan:   Principal Problem:   Sepsis ([place]) Active Problems:   Essential hypertension   Colitis   Prostate cancer ([place])   Major depression, recurrent, chronic ([place])   Dementia without behavioral disturbance ([facility])   Restless leg syndrome   Ischemic colitis ([place])   Lower GI bleed  Sepsis secondary to colitis - RESOLVED -Patient presented with fever, leukocytosis, tachycardia and tachypnea -Abdominal CT indicated distal transverse splenic flexure and sigmoid colon colitis -Stool for C. difficile found to be negative -GI pathogen panel neg -Patient completed intravenous antibiotics--finished 8 days 05/30/20 -suspect ischemic colitis vs infectious -continuefull liquid -appreciate GI consult>>>CTA abd/pelvis -12/16 CTA abd/pelvis-persistent colitis; tandem stenosis of IMA but patent celiac axis and SMA; Colon is nondilated -WBC improved from 29.7>>10.5  Hematochezia/Ischemic Colitis -due to ischemic colitis vs infectious -no hematochezia on 12/18-12/20 -drop in Hb due to dilution -discussed with GI, Dr. Laural Golden 05/30/20--flex sig cancelled 12/22 - advanced to full liquid diet, if tolerates, likely could DC home in next 1-2 days  Pleural Effusions -lasix IV x 2 -stable on RA -due to third-spacing  Hypertension -Continued on  diltiazem and hydralazine -Lisinopril currently on hold since he was exposed to contrastand RAS -addedmetoprolol with improvement in BP control and better results  Prostate cancer -Continue follow-up with urology -Continue on alfuzosin  Dementiawithout behavioral disturbance -continue Namenda  Hypokalemia -replete -check mag--1.8  Status is: Inpatient  Remains inpatient appropriate because:IV treatments appropriate due to intensity of illness or inability to take PO   Dispo: The patient is from:SNF Anticipated d/c is to:SNF Anticipated d/c date is: 1 day Patient currently is not medically stable to d/c.   Family Communication:daughter-in-law updated bedside 12/19  Consultants:none  Code Status: DNR  DVT Prophylaxis: SCDs   Status is: Inpatient  Remains inpatient appropriate because:IV treatments appropriate due to intensity of illness or inability to take PO and Inpatient level of care appropriate due to severity of illness   Dispo: The patient is from: Home              Anticipated d/c is to: Home              Anticipated d/c date is: 1 day              Patient currently is not medically stable to d/c.  Consultants:   GI   Procedures:   n/a  Antimicrobials:  N/a  Subjective: Pt reports that he is feeling much better and stools have slowed down, no BM since last evening.   Objective: Vitals:   05/30/20 1442 05/30/20 2123 05/31/20 0520 05/31/20 1418  BP: (!) 150/71 (!) 187/99 (!) 171/87 (!) 146/69  Pulse: 70 97 72 77  Resp: 19 19 20 18   Temp: 97.9 F (36.6 C) 97.6 F (36.4 C) 97.6 F (36.4 C) 97.9 F (36.6 C)  TempSrc:   Oral Oral  SpO2: 94% 94% 96% 95%  Weight:  Height:        Intake/Output Summary (Last 24 hours) at 05/31/2020 1438 Last data filed at 05/31/2020 0603 Gross per 24 hour  Intake 360 ml  Output 1100 ml  Net -740 ml   Filed Weights   05/22/20 1810   Weight: 88 kg    Examination:  General exam: Appears calm and comfortable  Respiratory system: Clear to auscultation. Respiratory effort normal. Cardiovascular system: normal S1 & S2 heard. No JVD, murmurs, rubs, gallops or clicks. No pedal edema. Gastrointestinal system: Abdomen is nondistended, soft and nontender. No organomegaly or masses felt. Normal bowel sounds heard. Central nervous system: Alert and oriented. No focal neurological deficits. Extremities: Symmetric 5 x 5 power. Skin: No rashes, lesions or ulcers Psychiatry: Judgement and insight appear normal. Mood & affect appropriate.   Data Reviewed: I have personally reviewed following labs and imaging studies  CBC: Recent Labs  Lab 05/27/20 0452 05/28/20 0504 05/29/20 0510 05/30/20 0534 05/31/20 0627  WBC 13.6* 10.9* 9.8 10.5 9.7  NEUTROABS  --   --   --   --  6.7  HGB 11.6* 11.8* 11.8* 11.9* 12.9*  HCT 36.7* 36.8* 37.6* 37.9* 40.5  MCV 82.3 81.8 81.7 81.3 82.0  PLT 235 227 230 216 [id number]    Basic Metabolic Panel: Recent Labs  Lab 05/25/20 0636 05/26/20 0607 05/27/20 0452 05/28/20 0504 05/29/20 0510 05/30/20 0534 05/31/20 0627  NA 137 136 138 137 137 139 139  K 3.4* 3.1* 3.0* 3.0* 2.9* 2.9* 3.1*  CL 105 102 104 103 102 103 103  CO2 26 25 26 28 27 28 28   GLUCOSE 109* 138* 123* 132* 127* 126* 130*  BUN 24* 14 11 10 10 10 9   CREATININE 0.86 0.80 0.78 0.82 0.78 0.85 0.89  CALCIUM 8.9 8.8* 8.8* 8.7* 8.8* 8.6* 9.0  MG 1.9 1.8 1.8  --  1.9 1.9  --     GFR: Estimated Creatinine Clearance: 63 mL/min (by C-G formula based on SCr of 0.89 mg/dL).  Liver Function Tests: No results for input(s): AST, ALT, ALKPHOS, BILITOT, PROT, ALBUMIN in the last 168 hours.  CBG: No results for input(s): GLUCAP in the last 168 hours.  Recent Results (from the past 240 hour(s))  Culture, blood (routine x 2)     Status: None   Collection Time: 05/22/20  7:45 AM   Specimen: BLOOD  Result Value Ref Range Status   Specimen  Description BLOOD RIGHT ANTECUBITAL DRAWN BY RN  Final   Special Requests   Final    BOTTLES DRAWN AEROBIC AND ANAEROBIC Blood Culture results may not be optimal due to an excessive volume of blood received in culture bottles   Culture   Final    NO GROWTH 5 DAYS Performed at [facility], [address] [place], [place] [phone]    Report Status 05/27/2020 FINAL  Final  Culture, blood (routine x 2)     Status: None   Collection Time: 05/22/20  7:50 AM   Specimen: BLOOD  Result Value Ref Range Status   Specimen Description BLOOD LEFT ANTECUBITAL DRAWN BY RN  Final   Special Requests   Final    BOTTLES DRAWN AEROBIC AND ANAEROBIC Blood Culture results may not be optimal due to an excessive volume of blood received in culture bottles   Culture   Final    NO GROWTH 5 DAYS Performed at [facility], [address], [place], [place] [address]    Report Status 05/27/2020 FINAL  Final  Culture, Urine     Status: Abnormal   Collection Time: 05/22/20 11:12 AM   Specimen: Urine, Clean Catch  Result Value Ref Range Status   Specimen Description   Final    URINE, CLEAN CATCH Performed at [facility], [address], [place], [place] [address]    Special Requests   Final    NONE Performed at [facility], [address], [place], [place] [address]    Culture (A)  Final    <10,000 COLONIES/mL INSIGNIFICANT GROWTH Performed at [facility] [address], [place], [place] [address]    Report Status 05/23/2020 FINAL  Final  Blood Culture (routine x 2)     Status: None   Collection Time: 05/22/20  6:18 PM   Specimen: BLOOD  Result Value Ref Range Status   Specimen Description BLOOD RIGHT ANTECUBITAL  Final   Special Requests   Final    BOTTLES DRAWN AEROBIC AND ANAEROBIC Blood Culture adequate volume   Culture   Final    NO GROWTH 5 DAYS Performed at [facility], [address], [place], [place] [address]    Report Status 05/27/2020 FINAL  Final  Blood Culture (routine x 2)      Status: None   Collection Time: 05/22/20  6:23 PM   Specimen: BLOOD  Result Value Ref Range Status   Specimen Description BLOOD BLOOD RIGHT WRIST  Final   Special Requests   Final    BOTTLES DRAWN AEROBIC AND ANAEROBIC Blood Culture adequate volume   Culture   Final    NO GROWTH 5 DAYS Performed at [facility], [address] [place], [place] [address]    Report Status 05/27/2020 FINAL  Final  C Difficile Quick Screen w PCR reflex     Status: None   Collection Time: 05/22/20 11:55 PM   Specimen: STOOL  Result Value Ref Range Status   C Diff antigen NEGATIVE NEGATIVE Final   C Diff toxin NEGATIVE NEGATIVE Final   C Diff interpretation No C. difficile detected.  Final    Comment: Performed at [facility], [address] [place], [place] [address]  Resp Panel by RT-PCR (Flu A&B, Covid) Nasopharyngeal Swab     Status: None   Collection Time: 05/23/20  1:00 AM   Specimen: Nasopharyngeal Swab; Nasopharyngeal(NP) swabs in vial transport medium  Result Value Ref Range Status   SARS Coronavirus 2 by RT PCR NEGATIVE NEGATIVE Final    Comment: (NOTE) SARS-CoV-2 target nucleic acids are NOT DETECTED.  The SARS-CoV-2 RNA is generally detectable in upper respiratory specimens during the acute phase of infection. The lowest concentration of SARS-CoV-2 viral copies this assay can detect is 138 copies/mL. A negative result does not preclude SARS-Cov-2 infection and should not be used as the sole basis for treatment or other patient management decisions. A negative result may occur with  improper specimen collection/handling, submission of specimen other than nasopharyngeal swab, presence of viral mutation(s) within the areas targeted by this assay, and inadequate number of viral copies(<138 copies/mL). A negative result must be combined with clinical observations, patient history, and epidemiological information. The expected result is Negative.  Fact Sheet for Patients:   EntrepreneurPulse.com.au  Fact Sheet for Healthcare Providers:  IncredibleEmployment.be  This test is no t yet approved or cleared by the [place] FDA and  has been authorized for detection and/or diagnosis of SARS-CoV-2 by FDA under an Emergency Use Authorization (EUA). This EUA will remain  in effect (meaning this test  can be used) for the duration of the COVID-19 declaration under Section 564(b)(1) of the Act, 21 U.S.C.section 360bbb-3(b)(1), unless the authorization is terminated  or revoked sooner.       Influenza A by PCR NEGATIVE NEGATIVE Final   Influenza B by PCR NEGATIVE NEGATIVE Final    Comment: (NOTE) The Xpert Xpress SARS-CoV-2/FLU/RSV plus assay is intended as an aid in the diagnosis of influenza from Nasopharyngeal swab specimens and should not be used as a sole basis for treatment. Nasal washings and aspirates are unacceptable for Xpert Xpress SARS-CoV-2/FLU/RSV testing.  Fact Sheet for Patients: EntrepreneurPulse.com.au  Fact Sheet for Healthcare Providers: IncredibleEmployment.be  This test is not yet approved or cleared by the [place] FDA and has been authorized for detection and/or diagnosis of SARS-CoV-2 by FDA under an Emergency Use Authorization (EUA). This EUA will remain in effect (meaning this test can be used) for the duration of the COVID-19 declaration under Section 564(b)(1) of the Act, 21 U.S.C. section 360bbb-3(b)(1), unless the authorization is terminated or revoked.  Performed at [facility], [address] [place], [place] [address]   MRSA PCR Screening     Status: None   Collection Time: 05/23/20  2:53 PM   Specimen: Nasal Mucosa; Nasopharyngeal  Result Value Ref Range Status   MRSA by PCR NEGATIVE NEGATIVE Final    Comment:        The GeneXpert MRSA Assay (FDA approved for NASAL specimens only), is one component of a comprehensive MRSA  colonization surveillance program. It is not intended to diagnose MRSA infection nor to guide or monitor treatment for MRSA infections. Performed at [facility], [address] [place], [place] [address]   Gastrointestinal Panel by PCR , Stool     Status: None   Collection Time: 05/26/20  1:40 AM   Specimen: Stool  Result Value Ref Range Status   Campylobacter species NOT DETECTED NOT DETECTED Final   Plesimonas shigelloides NOT DETECTED NOT DETECTED Final   Salmonella species NOT DETECTED NOT DETECTED Final   Yersinia enterocolitica NOT DETECTED NOT DETECTED Final   Vibrio species NOT DETECTED NOT DETECTED Final   Vibrio cholerae NOT DETECTED NOT DETECTED Final   Enteroaggregative E coli (EAEC) NOT DETECTED NOT DETECTED Final   Enteropathogenic E coli (EPEC) NOT DETECTED NOT DETECTED Final   Enterotoxigenic E coli (ETEC) NOT DETECTED NOT DETECTED Final   Shiga like toxin producing E coli (STEC) NOT DETECTED NOT DETECTED Final   Shigella/Enteroinvasive E coli (EIEC) NOT DETECTED NOT DETECTED Final   Cryptosporidium NOT DETECTED NOT DETECTED Final   Cyclospora cayetanensis NOT DETECTED NOT DETECTED Final   Entamoeba histolytica NOT DETECTED NOT DETECTED Final   Giardia lamblia NOT DETECTED NOT DETECTED Final   Adenovirus F40/41 NOT DETECTED NOT DETECTED Final   Astrovirus NOT DETECTED NOT DETECTED Final   Norovirus GI/GII NOT DETECTED NOT DETECTED Final   Rotavirus A NOT DETECTED NOT DETECTED Final   Sapovirus (I, II, IV, and V) NOT DETECTED NOT DETECTED Final    Comment: Performed at [facility], [address] [place], [place] [address]     Radiology Studies: No results found.   Scheduled Meds: . alfuzosin  10 mg Oral Q breakfast  . diltiazem  360 mg Oral Daily  . hydrALAZINE  100 mg Oral TID  . memantine  10 mg Oral BID  . metoprolol tartrate  25 mg Oral BID  . venlafaxine XR  75 mg Oral Q breakfast   Continuous Infusions: .  potassium chloride 10 mEq  (05/31/20 1342)     LOS: 9 days   Time spent: 35 mins   Hazel Wrinkle Wynetta Emery, MD How to contact the [facility] Attending or Consulting provider [place] or covering provider during after hours [place], for this patient?  1. Check the care team in [facility] and look for a) attending/consulting [facility] provider listed and b) the [facility] team listed 2. Log into [email or web address] and use [place]'s universal password to access. If you do not have the password, please contact the hospital operator. 3. Locate the [facility] provider you are looking for under [facility] and page to a number that you can be directly reached. 4. If you still have difficulty reaching the provider, please page the [facility] (Director on Call) for the Hospitalists listed on amion for assistance.  05/31/2020, 2:38 PM

## 2020-05-31 NOTE — Plan of Care (Signed)

## 2020-06-01 ENCOUNTER — Inpatient Hospital Stay
Admission: RE | Admit: 2020-06-01 | Discharge: 2020-06-09 | Disposition: A | Discharge status: Nursing Home (Includes ICF) | Payer: Medicare Other | Source: Ambulatory Visit | Attending: Internal Medicine | Admitting: Internal Medicine

## 2020-06-01 DIAGNOSIS — C61 Malignant neoplasm of prostate: Secondary | ICD-10-CM | POA: Diagnosis not present

## 2020-06-01 DIAGNOSIS — E875 Hyperkalemia: Secondary | ICD-10-CM | POA: Diagnosis present

## 2020-06-01 DIAGNOSIS — K922 Gastrointestinal hemorrhage, unspecified: Secondary | ICD-10-CM | POA: Diagnosis not present

## 2020-06-01 DIAGNOSIS — M6281 Muscle weakness (generalized): Secondary | ICD-10-CM | POA: Diagnosis not present

## 2020-06-01 DIAGNOSIS — N179 Acute kidney failure, unspecified: Secondary | ICD-10-CM | POA: Diagnosis present

## 2020-06-01 DIAGNOSIS — B370 Candidal stomatitis: Secondary | ICD-10-CM | POA: Diagnosis not present

## 2020-06-01 DIAGNOSIS — K5730 Diverticulosis of large intestine without perforation or abscess without bleeding: Secondary | ICD-10-CM | POA: Diagnosis not present

## 2020-06-01 DIAGNOSIS — F411 Generalized anxiety disorder: Secondary | ICD-10-CM | POA: Diagnosis present

## 2020-06-01 DIAGNOSIS — R109 Unspecified abdominal pain: Secondary | ICD-10-CM | POA: Diagnosis present

## 2020-06-01 DIAGNOSIS — R0600 Dyspnea, unspecified: Secondary | ICD-10-CM | POA: Diagnosis not present

## 2020-06-01 DIAGNOSIS — Z66 Do not resuscitate: Secondary | ICD-10-CM | POA: Diagnosis present

## 2020-06-01 DIAGNOSIS — N3281 Overactive bladder: Secondary | ICD-10-CM | POA: Diagnosis not present

## 2020-06-01 DIAGNOSIS — K8020 Calculus of gallbladder without cholecystitis without obstruction: Secondary | ICD-10-CM | POA: Diagnosis not present

## 2020-06-01 DIAGNOSIS — I10 Essential (primary) hypertension: Secondary | ICD-10-CM | POA: Diagnosis not present

## 2020-06-01 DIAGNOSIS — R011 Cardiac murmur, unspecified: Secondary | ICD-10-CM | POA: Diagnosis not present

## 2020-06-01 DIAGNOSIS — S72001A Fracture of unspecified part of neck of right femur, initial encounter for closed fracture: Secondary | ICD-10-CM | POA: Diagnosis not present

## 2020-06-01 DIAGNOSIS — K219 Gastro-esophageal reflux disease without esophagitis: Secondary | ICD-10-CM | POA: Diagnosis not present

## 2020-06-01 DIAGNOSIS — Z20822 Contact with and (suspected) exposure to covid-19: Secondary | ICD-10-CM | POA: Diagnosis present

## 2020-06-01 DIAGNOSIS — D62 Acute posthemorrhagic anemia: Secondary | ICD-10-CM | POA: Diagnosis not present

## 2020-06-01 DIAGNOSIS — R140 Abdominal distension (gaseous): Secondary | ICD-10-CM | POA: Diagnosis not present

## 2020-06-01 DIAGNOSIS — A419 Sepsis, unspecified organism: Secondary | ICD-10-CM | POA: Diagnosis not present

## 2020-06-01 DIAGNOSIS — K5909 Other constipation: Secondary | ICD-10-CM | POA: Diagnosis not present

## 2020-06-01 DIAGNOSIS — G9341 Metabolic encephalopathy: Secondary | ICD-10-CM | POA: Diagnosis present

## 2020-06-01 DIAGNOSIS — R1310 Dysphagia, unspecified: Secondary | ICD-10-CM | POA: Diagnosis not present

## 2020-06-01 DIAGNOSIS — R262 Difficulty in walking, not elsewhere classified: Secondary | ICD-10-CM | POA: Diagnosis not present

## 2020-06-01 DIAGNOSIS — G2581 Restless legs syndrome: Secondary | ICD-10-CM | POA: Diagnosis present

## 2020-06-01 DIAGNOSIS — R2681 Unsteadiness on feet: Secondary | ICD-10-CM | POA: Diagnosis not present

## 2020-06-01 DIAGNOSIS — Z7982 Long term (current) use of aspirin: Secondary | ICD-10-CM | POA: Diagnosis not present

## 2020-06-01 DIAGNOSIS — F339 Major depressive disorder, recurrent, unspecified: Secondary | ICD-10-CM | POA: Diagnosis not present

## 2020-06-01 DIAGNOSIS — E43 Unspecified severe protein-calorie malnutrition: Secondary | ICD-10-CM | POA: Diagnosis not present

## 2020-06-01 DIAGNOSIS — R112 Nausea with vomiting, unspecified: Secondary | ICD-10-CM | POA: Diagnosis not present

## 2020-06-01 DIAGNOSIS — K529 Noninfective gastroenteritis and colitis, unspecified: Secondary | ICD-10-CM | POA: Diagnosis not present

## 2020-06-01 DIAGNOSIS — J90 Pleural effusion, not elsewhere classified: Secondary | ICD-10-CM | POA: Diagnosis not present

## 2020-06-01 DIAGNOSIS — N289 Disorder of kidney and ureter, unspecified: Secondary | ICD-10-CM | POA: Diagnosis not present

## 2020-06-01 DIAGNOSIS — Z79899 Other long term (current) drug therapy: Secondary | ICD-10-CM | POA: Diagnosis not present

## 2020-06-01 DIAGNOSIS — N281 Cyst of kidney, acquired: Secondary | ICD-10-CM | POA: Diagnosis not present

## 2020-06-01 DIAGNOSIS — E785 Hyperlipidemia, unspecified: Secondary | ICD-10-CM | POA: Diagnosis not present

## 2020-06-01 DIAGNOSIS — E7800 Pure hypercholesterolemia, unspecified: Secondary | ICD-10-CM | POA: Diagnosis present

## 2020-06-01 DIAGNOSIS — J9611 Chronic respiratory failure with hypoxia: Secondary | ICD-10-CM | POA: Diagnosis present

## 2020-06-01 DIAGNOSIS — R197 Diarrhea, unspecified: Secondary | ICD-10-CM | POA: Diagnosis not present

## 2020-06-01 DIAGNOSIS — K6389 Other specified diseases of intestine: Secondary | ICD-10-CM | POA: Diagnosis not present

## 2020-06-01 DIAGNOSIS — K824 Cholesterolosis of gallbladder: Secondary | ICD-10-CM | POA: Diagnosis not present

## 2020-06-01 DIAGNOSIS — R079 Chest pain, unspecified: Secondary | ICD-10-CM | POA: Diagnosis not present

## 2020-06-01 DIAGNOSIS — Z8546 Personal history of malignant neoplasm of prostate: Secondary | ICD-10-CM | POA: Diagnosis not present

## 2020-06-01 DIAGNOSIS — F0390 Unspecified dementia without behavioral disturbance: Secondary | ICD-10-CM | POA: Diagnosis not present

## 2020-06-01 DIAGNOSIS — K559 Vascular disorder of intestine, unspecified: Secondary | ICD-10-CM | POA: Diagnosis present

## 2020-06-01 DIAGNOSIS — K828 Other specified diseases of gallbladder: Secondary | ICD-10-CM | POA: Diagnosis not present

## 2020-06-01 DIAGNOSIS — E876 Hypokalemia: Secondary | ICD-10-CM | POA: Diagnosis not present

## 2020-06-01 DIAGNOSIS — D508 Other iron deficiency anemias: Secondary | ICD-10-CM | POA: Diagnosis present

## 2020-06-01 DIAGNOSIS — M25551 Pain in right hip: Secondary | ICD-10-CM | POA: Diagnosis not present

## 2020-06-01 DIAGNOSIS — R279 Unspecified lack of coordination: Secondary | ICD-10-CM | POA: Diagnosis not present

## 2020-06-01 DIAGNOSIS — I739 Peripheral vascular disease, unspecified: Secondary | ICD-10-CM | POA: Diagnosis present

## 2020-06-01 DIAGNOSIS — Z993 Dependence on wheelchair: Secondary | ICD-10-CM | POA: Diagnosis not present

## 2020-06-01 DIAGNOSIS — Z9981 Dependence on supplemental oxygen: Secondary | ICD-10-CM | POA: Diagnosis not present

## 2020-06-01 DIAGNOSIS — S72001D Fracture of unspecified part of neck of right femur, subsequent encounter for closed fracture with routine healing: Secondary | ICD-10-CM | POA: Diagnosis not present

## 2020-06-01 DIAGNOSIS — K648 Other hemorrhoids: Secondary | ICD-10-CM | POA: Diagnosis present

## 2020-06-01 DIAGNOSIS — Z1159 Encounter for screening for other viral diseases: Secondary | ICD-10-CM | POA: Diagnosis not present

## 2020-06-01 DIAGNOSIS — K8010 Calculus of gallbladder with chronic cholecystitis without obstruction: Secondary | ICD-10-CM | POA: Diagnosis present

## 2020-06-01 DIAGNOSIS — J849 Interstitial pulmonary disease, unspecified: Secondary | ICD-10-CM | POA: Diagnosis present

## 2020-06-01 LAB — CBC WITH DIFFERENTIAL/PLATELET
Abs Immature Granulocytes: 0.2 10*3/uL — ABNORMAL HIGH (ref 0.00–0.07)
Basophils Absolute: 0 10*3/uL (ref 0.0–0.1)
Basophils Relative: 0 %
Eosinophils Absolute: 0.1 10*3/uL (ref 0.0–0.5)
Eosinophils Relative: 1 %
HCT: 40.3 % (ref 39.0–52.0)
Hemoglobin: 12.5 g/dL — ABNORMAL LOW (ref 13.0–17.0)
Immature Granulocytes: 2 %
Lymphocytes Relative: 13 %
Lymphs Abs: 1.6 10*3/uL (ref 0.7–4.0)
MCH: 25.4 pg — ABNORMAL LOW (ref 26.0–34.0)
MCHC: 31 g/dL (ref 30.0–36.0)
MCV: 81.9 fL (ref 80.0–100.0)
Monocytes Absolute: 0.9 10*3/uL (ref 0.1–1.0)
Monocytes Relative: 7 %
Neutro Abs: 9.4 10*3/uL — ABNORMAL HIGH (ref 1.7–7.7)
Neutrophils Relative %: 77 %
Platelets: 236 10*3/uL (ref 150–400)
RBC: 4.92 MIL/uL (ref 4.22–5.81)
RDW: 15.1 % (ref 11.5–15.5)
WBC: 12.2 10*3/uL — ABNORMAL HIGH (ref 4.0–10.5)
nRBC: 0 % (ref 0.0–0.2)

## 2020-06-01 LAB — SARS CORONAVIRUS 2 BY RT PCR (HOSPITAL ORDER, PERFORMED IN ~~LOC~~ HOSPITAL LAB): SARS Coronavirus 2: NEGATIVE

## 2020-06-01 LAB — BASIC METABOLIC PANEL
Anion gap: 12 (ref 5–15)
BUN: 12 mg/dL (ref 8–23)
CO2: 25 mmol/L (ref 22–32)
Calcium: 8.9 mg/dL (ref 8.9–10.3)
Chloride: 103 mmol/L (ref 98–111)
Creatinine, Ser: 0.81 mg/dL (ref 0.61–1.24)
GFR, Estimated: >60 mL/min (ref >60)
Glucose, Bld: 136 mg/dL — ABNORMAL HIGH (ref 70–99)
Potassium: 3 mmol/L — ABNORMAL LOW (ref 3.5–5.1)
Sodium: 140 mmol/L (ref 135–145)

## 2020-06-01 LAB — MAGNESIUM: Magnesium: 1.9 mg/dL (ref 1.7–2.4)

## 2020-06-01 MED ORDER — CLONIDINE HCL 0.1 MG PO TABS: 0.1000 mg | ORAL_TABLET | ORAL | 11 refills | Status: DC

## 2020-06-01 MED ORDER — POTASSIUM CHLORIDE CRYS ER 20 MEQ PO TBCR
60.0000 meq | EXTENDED_RELEASE_TABLET | Freq: Once | ORAL | Status: AC
  Administered 2020-06-01: 09:00:00 60 meq via ORAL
  Filled 2020-06-01: qty 3

## 2020-06-01 MED ORDER — METOPROLOL TARTRATE 25 MG PO TABS: 25.0000 mg | ORAL_TABLET | Freq: Two times a day (BID) | ORAL | Status: DC

## 2020-06-01 MED ORDER — LOPERAMIDE HCL 2 MG PO CAPS: 2.0000 mg | ORAL_CAPSULE | Freq: Three times a day (TID) | ORAL | 0 refills | Status: DC | PRN

## 2020-06-01 NOTE — Care Management Important Message (Signed)
Important Message  Patient Details  Name: Jeffrey Sherman MRN: [id number] Date of Birth: 1929-08-31   Medicare Important Message Given:  Yes     [name], LCSW 06/01/2020, 10:23 AM

## 2020-06-01 NOTE — Discharge Summary (Signed)
Physician Discharge Summary  Jeffrey Sherman [id number] DOB: 22-Apr-1930 DOA: 05/22/2020  PCP: Celene Squibb, MD  Admit date: 05/22/2020 Discharge date: 06/01/2020  Admitted From:  SNF  Disposition:  SNF   Recommendations for Outpatient Follow-up:  1. Follow up with PCP in 2 weeks 2. Follow up with GI in 1 month 3. Please obtain BMP/ in one week to check potassium level  Discharge Condition: STABLE   CODE STATUS: DNR    Brief Hospitalization Summary: Please see all hospital notes, images, labs for full details of the hospitalization. ADMISSION HPI: Jeffrey Sherman is a 86 y.o. male with medical history significant for prostate cancer, dementia, hypertension, peptic ulcer disease.  Brought to the ED from nursing home with reports of abdominal pain, diarrhea.  Patient's symptoms started yesterday, with nausea vomiting and diarrhea.  There was some bright red blood in his stool.  He also reports vomiting.  Patient reports abdominal pain was very severe, and all over his abdomen particularly left side of abdomen. Reports similar presentation 2 years ago.  Patient also had hip fracture and repair October this year.  He reports he still having persistent pain in his right hip and is unable to bear weight, pain is so severe he cannot sleep.  Currently ambulating with a wheelchair.  ED Course:, Tachycardic to 116, intermittent tachypnea to 24, blood pressure systolic [id number] to [id number], O2 sats greater than 91% on room air.  Potassium 3.4.  WBC 29.  Abd Ct-consistent with colitis involving the distal transverse, splenic flexure and descending colon-infectious versus h ischemic etiology.  Portable chest x-ray unremarkable.  IV cefepime and metronidazole.  Hospitalist to admit for likely infectious colitis.  Hospital Course   Sepsis secondary to colitis - RESOLVED -Patient presented with fever, leukocytosis, tachycardia and tachypnea -Abdominal CT indicated distal transverse splenic flexure and  sigmoid colon colitis -Stool for C. difficile found to be negative -GI pathogen panel neg -Patient completed intravenous antibiotics--finished 8 days 05/30/20 -suspect ischemic colitisvs infectious -continuefull liquid -appreciate GI consult>>>CTA abd/pelvis -12/16 CTA abd/pelvis-persistent colitis; tandem stenosis of IMA but patent celiac axis and SMA; Colon is nondilated -WBC improved  Hematochezia/Ischemic Colitis -due to ischemic colitisvs infectious -no hematochezia on 12/18-12/20 -drop in Hb due to dilution -discussed with GI, Dr.Rehman 05/30/20--flex sig cancelled 12/22 - advanced to soft mechanical diet which he has tolerated and is feeling better and cleared by GI to discharge to SNF  Pleural Effusions -lasix IV x2 was given -stable on RA -due to third-spacing  Hypertension -Continued on diltiazem and hydralazine -addedmetoprolol with improvement in BP control and better results - resume home treatment plan   Prostate cancer -Continue follow-up with urology -Continue on alfuzosin  Dementiawithout behavioral disturbance -continue Namenda  Hypokalemia -repleted orally   Discharge Diagnoses:  Principal Problem:   Sepsis ([place]) Active Problems:   Essential hypertension   Colitis   Prostate cancer ([place])   Major depression, recurrent, chronic ([place])   Dementia without behavioral disturbance ([place])   Restless leg syndrome   Ischemic colitis ([place])   Lower GI bleed   Right hip pain   Discharge Instructions:  Allergies as of 06/01/2020   No Known Allergies     Medication List    TAKE these medications   acetaminophen 500 MG tablet Commonly known as: TYLENOL Take 1,000 mg by mouth in the morning, at noon, and at bedtime.   alfuzosin 10 MG 24 hr tablet Commonly known as: UROXATRAL Take 10 mg by mouth daily with breakfast.  aspirin 81 MG chewable tablet Chew 81 mg by mouth daily.   calcium carbonate 500 MG chewable tablet Commonly known  as: TUMS - dosed in mg elemental calcium Chew 1 tablet by mouth 2 (two) times daily as needed for indigestion or heartburn.   cloNIDine 0.1 MG tablet Commonly known as: CATAPRES Take 1 tablet (0.1 mg total) by mouth See admin instructions. Stat-immediately due to elavated blood pressure if needed What changed: additional instructions   diltiazem 360 MG 24 hr capsule Commonly known as: CARDIZEM CD Take 360 mg by mouth daily. Start 03/23/2020   ferrous sulfate 325 (65 FE) MG tablet Take 325 mg by mouth daily with breakfast. For Anemia and  Iron Deficiency   Fish Oil-Flax Oil-Borage Oil Caps Take 1 capsule by mouth daily.   hydrALAZINE 100 MG tablet Commonly known as: APRESOLINE Take 100 mg by mouth 3 (three) times daily.   lisinopril 40 MG tablet Commonly known as: ZESTRIL Take 1 tablet (40 mg total) by mouth daily.   loperamide 2 MG capsule Commonly known as: IMODIUM Take 1 capsule (2 mg total) by mouth 3 (three) times daily as needed for diarrhea or loose stools.   Melatonin 10 MG Tabs Take 10 mg by mouth at bedtime.   memantine 10 MG tablet Commonly known as: NAMENDA Take 10 mg by mouth 2 (two) times daily.   metoprolol tartrate 25 MG tablet Commonly known as: LOPRESSOR Take 1 tablet (25 mg total) by mouth 2 (two) times daily.   NON FORMULARY Diet - Mechanical Soft   omeprazole 20 MG capsule Commonly known as: PRILOSEC Take 1 capsule (20 mg total) by mouth daily before breakfast.   OXYGEN Inhale 2 L into the lungs continuous.   polyethylene glycol 17 g packet Commonly known as: MIRALAX / GLYCOLAX Take 17 g by mouth daily.   potassium chloride SA 20 MEQ tablet Commonly known as: KLOR-CON Take 20 mEq by mouth 2 (two) times daily.   PRO-STAT PO liquid; 15-100 gram-kcal/30 mL; amt: 30 ml; oral Special Instructions: for albumin 2.5 With Meals   rOPINIRole 0.25 MG tablet Commonly known as: REQUIP Take 0.25 mg by mouth at bedtime.   salicylic acid 17 %  gel Apply 1 application topically in the morning and at bedtime. Apply to warts to left hand   senna-docusate 8.6-50 MG tablet Commonly known as: Senokot-S Take 1 tablet by mouth at bedtime as needed for mild constipation.   terazosin 5 MG capsule Commonly known as: HYTRIN Take 5 mg by mouth at bedtime.   Venelex Oint Special Instructions: Apply to bilateral buttocks & sacral area qshift for blanchable erythema.   venlafaxine XR 150 MG 24 hr capsule Commonly known as: EFFEXOR-XR Take 75 mg by mouth daily with breakfast.   vitamin C 1000 MG tablet Take 1,000 mg by mouth daily.       Follow-up Information    Celene Squibb, MD. Schedule an appointment as soon as possible for a visit in 2 week(s).   Specialty: Internal Medicine Contact information: [facility] [address] [phone]        Rogene Houston, MD. Schedule an appointment as soon as possible for a visit in 1 month(s).   Specialty: Gastroenterology Contact information: [address], [address] [place] [address] [phone]              No Known Allergies Allergies as of 06/01/2020   No Known Allergies     Medication  List    TAKE these medications   acetaminophen 500 MG tablet Commonly known as: TYLENOL Take 1,000 mg by mouth in the morning, at noon, and at bedtime.   alfuzosin 10 MG 24 hr tablet Commonly known as: UROXATRAL Take 10 mg by mouth daily with breakfast.   aspirin 81 MG chewable tablet Chew 81 mg by mouth daily.   calcium carbonate 500 MG chewable tablet Commonly known as: TUMS - dosed in mg elemental calcium Chew 1 tablet by mouth 2 (two) times daily as needed for indigestion or heartburn.   cloNIDine 0.1 MG tablet Commonly known as: CATAPRES Take 1 tablet (0.1 mg total) by mouth See admin instructions. Stat-immediately due to elavated blood pressure if needed What changed: additional instructions   diltiazem 360 MG 24 hr capsule Commonly known as:  CARDIZEM CD Take 360 mg by mouth daily. Start 03/23/2020   ferrous sulfate 325 (65 FE) MG tablet Take 325 mg by mouth daily with breakfast. For Anemia and  Iron Deficiency   Fish Oil-Flax Oil-Borage Oil Caps Take 1 capsule by mouth daily.   hydrALAZINE 100 MG tablet Commonly known as: APRESOLINE Take 100 mg by mouth 3 (three) times daily.   lisinopril 40 MG tablet Commonly known as: ZESTRIL Take 1 tablet (40 mg total) by mouth daily.   loperamide 2 MG capsule Commonly known as: IMODIUM Take 1 capsule (2 mg total) by mouth 3 (three) times daily as needed for diarrhea or loose stools.   Melatonin 10 MG Tabs Take 10 mg by mouth at bedtime.   memantine 10 MG tablet Commonly known as: NAMENDA Take 10 mg by mouth 2 (two) times daily.   metoprolol tartrate 25 MG tablet Commonly known as: LOPRESSOR Take 1 tablet (25 mg total) by mouth 2 (two) times daily.   NON FORMULARY Diet - Mechanical Soft   omeprazole 20 MG capsule Commonly known as: PRILOSEC Take 1 capsule (20 mg total) by mouth daily before breakfast.   OXYGEN Inhale 2 L into the lungs continuous.   polyethylene glycol 17 g packet Commonly known as: MIRALAX / GLYCOLAX Take 17 g by mouth daily.   potassium chloride SA 20 MEQ tablet Commonly known as: KLOR-CON Take 20 mEq by mouth 2 (two) times daily.   PRO-STAT PO liquid; 15-100 gram-kcal/30 mL; amt: 30 ml; oral Special Instructions: for albumin 2.5 With Meals   rOPINIRole 0.25 MG tablet Commonly known as: REQUIP Take 0.25 mg by mouth at bedtime.   salicylic acid 17 % gel Apply 1 application topically in the morning and at bedtime. Apply to warts to left hand   senna-docusate 8.6-50 MG tablet Commonly known as: Senokot-S Take 1 tablet by mouth at bedtime as needed for mild constipation.   terazosin 5 MG capsule Commonly known as: HYTRIN Take 5 mg by mouth at bedtime.   Venelex Oint Special Instructions: Apply to bilateral buttocks & sacral area  qshift for blanchable erythema.   venlafaxine XR 150 MG 24 hr capsule Commonly known as: EFFEXOR-XR Take 75 mg by mouth daily with breakfast.   vitamin C 1000 MG tablet Take 1,000 mg by mouth daily.       Procedures/Studies: CT ABDOMEN PELVIS W CONTRAST  Result Date: 05/22/2020 CLINICAL DATA:  Abdominal pain, fever, rectal bleeding EXAM: CT ABDOMEN AND PELVIS WITH CONTRAST TECHNIQUE: Multidetector CT imaging of the abdomen and pelvis was performed using the standard protocol following bolus administration of intravenous contrast. CONTRAST:  142mL OMNIPAQUE IOHEXOL 300 MG/ML  SOLN COMPARISON:  May 2019 FINDINGS: Lower chest: Bibasilar atelectasis/scarring. Hepatobiliary: Small cyst of the left hepatic lobe. Few small layering gallstones. No biliary dilatation. Pancreas: Unremarkable. Spleen: Unremarkable. Adrenals/Urinary Tract: Adrenal glands are unremarkable. Bilateral renal cysts. 3 mm nonobstructing calculus of the lower pole the right kidney. Small bladder diverticula. Stomach/Bowel: Stomach is within normal limits. Distal colonic diverticulosis. There is wall thickening along the distal transverse colon, splenic flexure, and descending colon. Vascular/Lymphatic: Aortic atherosclerosis. No enlarged lymph nodes. Reproductive: Unremarkable. Other: Small volume abdominopelvic ascites. No acute abnormality of the abdominal wall. Musculoskeletal: Right femoral fixation with associated streak artifact. Degenerative changes of the lumbar spine. No acute osseous abnormality. IMPRESSION: Findings consistent with colitis involving the distal transverse colon, splenic flexure, and descending colon. This is likely ischemic or infectious in etiology. Diverticulosis is present but mostly distal to the area of thickening and diverticulitis is therefore less likely. 3 mm nonobstructing left renal calculus. Small bladder diverticula. Electronically Signed   By: Macy Mis M.D.   On: 05/22/2020 20:12   DG  Pelvis Portable  Result Date: 05/22/2020 CLINICAL DATA:  Fall, pelvic injury EXAM: PORTABLE PELVIS 1-2 VIEWS COMPARISON:  None. FINDINGS: ORIF of a right subcapital femoral neck fracture has been performed utilizing 4 partially-threaded screws with incomplete healing of the a fracture. No acute fracture or dislocation. Mild bilateral hip joint space narrowing is present in keeping with at least mild bilateral degenerative arthritis. Contrast is seen within the bladder lumen. Small bladder diverticulum present as well as trabeculation of the bladder wall is present in keeping with changes of probable chronic bladder outlet obstruction. Defect involving the bladder base likely represents mass effect from a hypertrophied central prostatic lobe. IMPRESSION: Status post right hip ORIF.  No acute fracture or dislocation. Findings in keeping with changes of chronic bladder outlet obstruction, possibly related to central prostatic hypertrophy. Electronically Signed   By: Fidela Salisbury MD   On: 05/22/2020 23:07   DG Chest Port 1 View  Result Date: 05/22/2020 CLINICAL DATA:  Questionable sepsis - evaluate for abnormality Patient reports dizziness leading to fall and abdominal pain. EXAM: PORTABLE CHEST 1 VIEW COMPARISON:  Radiograph 03/12/2020 FINDINGS: Improved cardiomegaly from prior. Unchanged mediastinal contours with aortic atherosclerosis and tortuosity. Chronic interstitial coarsening. Streaky atelectasis at the right lung base. No confluent consolidation. No pulmonary edema or large pleural effusion. No pneumothorax. No acute osseous abnormalities are seen. IMPRESSION: 1. Mild right basilar atelectasis. 2. Improved cardiomegaly from prior. Stable aortic atherosclerosis and tortuosity. Electronically Signed   By: Keith Rake M.D.   On: 05/22/2020 18:50   CT Angio Abd/Pel w/ and/or w/o  Result Date: 05/26/2020 CLINICAL DATA:  Left abdominal pain, diarrhea, suspicion of recurrent colitis, rule out  mesenteric ischemia EXAM: CTA ABDOMEN AND PELVIS WITH CONTRAST TECHNIQUE: Multidetector CT imaging of the abdomen and pelvis was performed using the standard protocol during bolus administration of intravenous contrast. Multiplanar reconstructed images and MIPs were obtained and reviewed to evaluate the vascular anatomy. CONTRAST:  119mL OMNIPAQUE IOHEXOL 350 MG/ML SOLN COMPARISON:  05/22/2020 and previous FINDINGS: VASCULAR Aorta: Moderate calcified atheromatous plaque. No aneurysm, dissection, or stenosis. Celiac: Calcified ostial plaque without stenosis. Mild narrowing at the median arcuate ligament of the diaphragm, with minimal poststenotic dilatation, patent distally. SMA: Widely patent.  Minimal plaque.  Classic distal branch anatomy. Renals: Duplicated left, superior dominant with calcified ostial plaque, no stenosis. The inferior left renal artery is aberrant, arising below the level of the IMA origin just above the aortic bifurcation, supplying  a portion of the lower pole. Duplicated right, superior dominant with calcified ostial plaque resulting in short segment stenosis of at least mild severity, patent distally. The more diminutive inferior right renal artery is aberrant arising below the level of the IMA, supplying a portion of the lower pole. IMA: Ostial stenosis of possible hemodynamic significance. Tandem heavily calcified lesion approximately 2.5 cm from the origin resulting in stenosis of probable hemodynamic significance. Inflow: Moderate calcified atheromatous plaque about the bifurcations of both common iliac arteries. No aneurysm, dissection, or stenosis. Proximal Outflow: Bilateral common femoral and visualized portions of the superficial and profunda femoral arteries are patent without evidence of aneurysm, dissection, vasculitis or significant stenosis. Veins: No obvious venous abnormality within the limitations of this arterial phase study. Retroaortic left renal vein, an anatomic variant.  Review of the MIP images confirms the above findings. NON-VASCULAR Lower chest: Small bilateral pleural effusions, and dependent atelectasis/consolidation posteriorly in both lung bases, increased since previous. Hepatobiliary: Benign cyst in hepatic for segment 4B, present since 08/15/2010. No new liver lesion or biliary ductal dilatation. Vicarious excretion of contrast material into the gallbladder obscuring the small calculi seen on the prior study. Pancreas: Unremarkable. No pancreatic ductal dilatation or surrounding inflammatory changes. Spleen: Normal in size without focal abnormality. Stable accessory splenule. Adrenals/Urinary Tract: Adrenal glands unremarkable. 6.7 cm cyst from the upper pole left kidney, slowly enlarging since 2012. 5.5 cm exophytic cyst from the mid right kidney, and 3.2 cm cyst from the right lower pole, present since 2012. 5 mm calcification in the lower pole right renal collecting system. 1 mm calcification in the left ureter at the level of the SI joint with stable mild pelvicaliectasis. Stomach/Bowel: Stomach is decompressed. Appendix not discretely identified no pericecal inflammatory/edematous change. The colon is nondilated. There is circumferential wall thickening in the distal transverse colon, splenic flexure, and descending colon. Multiple sigmoid diverticula without abscess. The rectum is nondistended. Lymphatic: Subcentimeter left para-aortic lymph node. No pelvic or mesenteric adenopathy. Reproductive: Mild prostate enlargement. Other: Small volume ascites in the lower abdomen, slightly increased since previous. No free air. Musculoskeletal: Lower lumbar spondylitic changes most marked L4-5. Orthopedic pins across the right femoral neck. No acute fracture or worrisome bone lesion. IMPRESSION: 1. Persistent colitis involving distal transverse colon, splenic flexure, and descending colon. 2. Mild lower abdominal ascites, slightly increased on a. 3. Tandem proximal stenoses  of the inferior mesenteric artery. The celiac axis and superior mesenteric artery remain widely patent however, which typically allows adequate collateral perfusion. 4. Increase in small bilateral pleural effusions and dependent atelectasis/consolidation in both lung bases since previous study. 5. Sigmoid diverticulosis. 6. 1 mm mid left ureteral calculus with stable left renal pelvicaliectasis. 7. Right nephrolithiasis. 8. Bilateral renal artery ostial stenosis of possible hemodynamic significance. Aortic Atherosclerosis (ICD10-I70.0). Electronically Signed   By: Lucrezia Europe M.D.   On: 05/26/2020 08:35      Subjective: Pt awake, alert, no BM, no diarrhea, tolerating diet.  No chest pain, no abdominal pain and no SOB.    Discharge Exam: Vitals:   05/31/20 2256 06/01/20 0524  BP: (!) 147/74 (!) 165/87  Pulse: 66 75  Resp: 18 18  Temp: 97.6 F (36.4 C) 98.2 F (36.8 C)  SpO2: 95% 96%   Vitals:   05/31/20 0520 05/31/20 1418 05/31/20 2256 06/01/20 0524  BP: (!) 171/87 (!) 146/69 (!) 147/74 (!) 165/87  Pulse: 72 77 66 75  Resp: 20 18 18 18   Temp: 97.6 F (36.4 C) 97.9 F (  36.6 C) 97.6 F (36.4 C) 98.2 F (36.8 C)  TempSrc: Oral Oral Tympanic Oral  SpO2: 96% 95% 95% 96%  Weight:      Height:       General: Pt is alert, awake, not in acute distress Cardiovascular: normal S1/S2 +, no rubs, no gallops Respiratory: CTA bilaterally, no wheezing, no rhonchi Abdominal: Soft, NT, ND, bowel sounds + Extremities: no edema, no cyanosis   The results of significant diagnostics from this hospitalization (including imaging, microbiology, ancillary and laboratory) are listed below for reference.     Microbiology: Recent Results (from the past 240 hour(s))  Culture, Urine     Status: Abnormal   Collection Time: 05/22/20 11:12 AM   Specimen: Urine, Clean Catch  Result Value Ref Range Status   Specimen Description   Final    URINE, CLEAN CATCH Performed at [facility], [address]  [place], [place] [address]    Special Requests   Final    NONE Performed at [facility], [address], [place], [place] [address]    Culture (A)  Final    <10,000 COLONIES/mL INSIGNIFICANT GROWTH Performed at [facility] [address] [place], [place] [address]    Report Status 05/23/2020 FINAL  Final  Blood Culture (routine x 2)     Status: None   Collection Time: 05/22/20  6:18 PM   Specimen: BLOOD  Result Value Ref Range Status   Specimen Description BLOOD RIGHT ANTECUBITAL  Final   Special Requests   Final    BOTTLES DRAWN AEROBIC AND ANAEROBIC Blood Culture adequate volume   Culture   Final    NO GROWTH 5 DAYS Performed at [facility], [address] [place], [place] [address]    Report Status 05/27/2020 FINAL  Final  Blood Culture (routine x 2)     Status: None   Collection Time: 05/22/20  6:23 PM   Specimen: BLOOD  Result Value Ref Range Status   Specimen Description BLOOD BLOOD RIGHT WRIST  Final   Special Requests   Final    BOTTLES DRAWN AEROBIC AND ANAEROBIC Blood Culture adequate volume   Culture   Final    NO GROWTH 5 DAYS Performed at [facility], [address] [place], [place] [address]    Report Status 05/27/2020 FINAL  Final  C Difficile Quick Screen w PCR reflex     Status: None   Collection Time: 05/22/20 11:55 PM   Specimen: STOOL  Result Value Ref Range Status   C Diff antigen NEGATIVE NEGATIVE Final   C Diff toxin NEGATIVE NEGATIVE Final   C Diff interpretation No C. difficile detected.  Final    Comment: Performed at [facility], [address], [place],  [address]  Resp Panel by RT-PCR (Flu A&B, Covid) Nasopharyngeal Swab     Status: None   Collection Time: 05/23/20  1:00 AM   Specimen: Nasopharyngeal Swab; Nasopharyngeal(NP) swabs in vial transport medium  Result Value Ref Range Status   SARS Coronavirus 2 by RT PCR NEGATIVE NEGATIVE Final    Comment: (NOTE) SARS-CoV-2 target nucleic acids are NOT DETECTED.  The SARS-CoV-2  RNA is generally detectable in upper respiratory specimens during the acute phase of infection. The lowest concentration of SARS-CoV-2 viral copies this assay can detect is 138 copies/mL. A negative result does not preclude SARS-Cov-2 infection and should not be used as the sole basis for treatment or other patient management decisions. A negative result may occur with  improper specimen collection/handling, submission of specimen other than nasopharyngeal swab, presence of viral mutation(s) within the areas targeted by this assay, and inadequate number of viral copies(<138 copies/mL). A negative result must be combined with clinical observations, patient history, and epidemiological information. The expected result is Negative.  Fact Sheet for Patients:  EntrepreneurPulse.com.au  Fact Sheet for Healthcare Providers:  IncredibleEmployment.be  This test is no t yet approved or cleared by the [place] FDA and  has been authorized for detection and/or diagnosis of SARS-CoV-2 by FDA under an Emergency Use Authorization (EUA). This EUA will remain  in effect (meaning this test can be used) for the duration of the COVID-19 declaration under Section 564(b)(1) of the Act, 21 U.S.C.section 360bbb-3(b)(1), unless the authorization is terminated  or revoked sooner.       Influenza A by PCR NEGATIVE NEGATIVE Final   Influenza B by PCR NEGATIVE NEGATIVE Final    Comment: (NOTE) The Xpert Xpress SARS-CoV-2/FLU/RSV plus assay is intended as an aid in the diagnosis of influenza from Nasopharyngeal swab specimens and should not be used as a sole basis for treatment. Nasal washings and aspirates are unacceptable for Xpert Xpress SARS-CoV-2/FLU/RSV testing.  Fact Sheet for Patients: EntrepreneurPulse.com.au  Fact Sheet for Healthcare Providers: IncredibleEmployment.be  This test is not yet approved or cleared by the  [place] FDA and has been authorized for detection and/or diagnosis of SARS-CoV-2 by FDA under an Emergency Use Authorization (EUA). This EUA will remain in effect (meaning this test can be used) for the duration of the COVID-19 declaration under Section 564(b)(1) of the Act, 21 U.S.C. section 360bbb-3(b)(1), unless the authorization is terminated or revoked.  Performed at [facility], [address], [place], [place] [address]   MRSA PCR Screening     Status: None   Collection Time: 05/23/20  2:53 PM   Specimen: Nasal Mucosa; Nasopharyngeal  Result Value Ref Range Status   MRSA by PCR NEGATIVE NEGATIVE Final    Comment:        The GeneXpert MRSA Assay (FDA approved for NASAL specimens only), is one component of a comprehensive MRSA colonization surveillance program. It is not intended to diagnose MRSA infection nor to guide or monitor treatment for MRSA infections. Performed at [facility], [address] [address], [place], [place] [address]   Gastrointestinal Panel by PCR , Stool     Status: None   Collection Time: 05/26/20  1:40 AM   Specimen: Stool  Result Value Ref Range Status   Campylobacter species NOT DETECTED NOT DETECTED Final   Plesimonas shigelloides NOT DETECTED NOT DETECTED Final   Salmonella species NOT DETECTED NOT DETECTED Final   Yersinia enterocolitica NOT DETECTED NOT DETECTED Final   Vibrio species NOT DETECTED NOT DETECTED Final   Vibrio cholerae NOT DETECTED NOT DETECTED Final   Enteroaggregative E coli (EAEC) NOT DETECTED NOT DETECTED Final   Enteropathogenic E coli (EPEC) NOT DETECTED NOT DETECTED Final   Enterotoxigenic E coli (ETEC) NOT DETECTED NOT DETECTED Final   Shiga like toxin producing E coli (STEC) NOT DETECTED NOT DETECTED Final   Shigella/Enteroinvasive E coli (EIEC) NOT DETECTED NOT DETECTED Final   Cryptosporidium NOT DETECTED NOT DETECTED Final   Cyclospora cayetanensis NOT DETECTED NOT DETECTED Final   Entamoeba histolytica NOT  DETECTED NOT DETECTED Final   Giardia lamblia NOT DETECTED NOT DETECTED Final   Adenovirus F40/41 NOT DETECTED NOT DETECTED Final   Astrovirus NOT DETECTED NOT DETECTED Final   Norovirus GI/GII NOT DETECTED NOT DETECTED  Final   Rotavirus A NOT DETECTED NOT DETECTED Final   Sapovirus (I, II, IV, and V) NOT DETECTED NOT DETECTED Final    Comment: Performed at  [facility], [address] [place], [place] [address]     Labs: BNP (last 3 results) No results for input(s): BNP in the last 8760 hours. Basic Metabolic Panel: Recent Labs  Lab 05/26/20 0607 05/27/20 0452 05/28/20 0504 05/29/20 0510 05/30/20 0534 05/31/20 0627 06/01/20 0615  NA 136 138 137 137 139 139 140  K 3.1* 3.0* 3.0* 2.9* 2.9* 3.1* 3.0*  CL 102 104 103 102 103 103 103  CO2 25 26 28 27 28 28 25   GLUCOSE 138* 123* 132* 127* 126* 130* 136*  BUN 14 11 10 10 10 9 12   CREATININE 0.80 0.78 0.82 0.78 0.85 0.89 0.81  CALCIUM 8.8* 8.8* 8.7* 8.8* 8.6* 9.0 8.9  MG 1.8 1.8  --  1.9 1.9  --  1.9   Liver Function Tests: No results for input(s): AST, ALT, ALKPHOS, BILITOT, PROT, ALBUMIN in the last 168 hours. No results for input(s): LIPASE, AMYLASE in the last 168 hours. No results for input(s): AMMONIA in the last 168 hours. CBC: Recent Labs  Lab 05/28/20 0504 05/29/20 0510 05/30/20 0534 05/31/20 0627 06/01/20 0615  WBC 10.9* 9.8 10.5 9.7 12.2*  NEUTROABS  --   --   --  6.7 9.4*  HGB 11.8* 11.8* 11.9* 12.9* 12.5*  HCT 36.8* 37.6* 37.9* 40.5 40.3  MCV 81.8 81.7 81.3 82.0 81.9  PLT 227 230 216 240 236   Cardiac Enzymes: No results for input(s): CKTOTAL, CKMB, CKMBINDEX, TROPONINI in the last 168 hours. BNP: Invalid input(s): POCBNP CBG: No results for input(s): GLUCAP in the last 168 hours. D-Dimer No results for input(s): DDIMER in the last 72 hours. Hgb A1c No results for input(s): HGBA1C in the last 72 hours. Lipid Profile No results for input(s): CHOL, HDL, LDLCALC, TRIG, CHOLHDL, LDLDIRECT in  the last 72 hours. Thyroid function studies No results for input(s): TSH, T4TOTAL, T3FREE, THYROIDAB in the last 72 hours.  Invalid input(s): FREET3 Anemia work up No results for input(s): VITAMINB12, FOLATE, FERRITIN, TIBC, IRON, RETICCTPCT in the last 72 hours. Urinalysis    Component Value Date/Time   COLORURINE YELLOW 05/22/2020 1111   APPEARANCEUR CLEAR 05/22/2020 1111   APPEARANCEUR Clear 02/15/2020 1321   LABSPEC 1.016 05/22/2020 1111   PHURINE 5.0 05/22/2020 1111   GLUCOSEU 150 (A) 05/22/2020 1111   HGBUR NEGATIVE 05/22/2020 [place] 05/22/2020 1111   BILIRUBINUR Negative 02/15/2020 1321   KETONESUR NEGATIVE 05/22/2020 1111   PROTEINUR >=300 (A) 05/22/2020 1111   UROBILINOGEN negative (A) 10/19/2019 0958   UROBILINOGEN 0.2 05/08/2014 2045   NITRITE NEGATIVE 05/22/2020 1111   LEUKOCYTESUR NEGATIVE 05/22/2020 1111   Sepsis Labs Invalid input(s): PROCALCITONIN,  WBC,  LACTICIDVEN Microbiology Recent Results (from the past 240 hour(s))  Culture, Urine     Status: Abnormal   Collection Time: 05/22/20 11:12 AM   Specimen: Urine, Clean Catch  Result Value Ref Range Status   Specimen Description   Final    URINE, CLEAN CATCH Performed at [facility], [address], [place], [place] [address]    Special Requests   Final    NONE Performed at [facility], [address] [place], [place] [address]    Culture (A)  Final    <10,000 COLONIES/mL INSIGNIFICANT GROWTH Performed at [facility], [place] [address] [place], [place] [address]  Report Status 05/23/2020 FINAL  Final  Blood Culture (routine x 2)     Status: None   Collection Time: 05/22/20  6:18 PM   Specimen: BLOOD  Result Value Ref Range Status   Specimen Description BLOOD RIGHT ANTECUBITAL  Final   Special Requests   Final    BOTTLES DRAWN AEROBIC AND ANAEROBIC Blood Culture adequate volume   Culture   Final    NO GROWTH 5 DAYS Performed at [facility], [address],  [place], [place] [address]    Report Status 05/27/2020 FINAL  Final  Blood Culture (routine x 2)     Status: None   Collection Time: 05/22/20  6:23 PM   Specimen: BLOOD  Result Value Ref Range Status   Specimen Description BLOOD BLOOD RIGHT WRIST  Final   Special Requests   Final    BOTTLES DRAWN AEROBIC AND ANAEROBIC Blood Culture adequate volume   Culture   Final    NO GROWTH 5 DAYS Performed at [facility], [address] [place], [place] [address]    Report Status 05/27/2020 FINAL  Final  C Difficile Quick Screen w PCR reflex     Status: None   Collection Time: 05/22/20 11:55 PM   Specimen: STOOL  Result Value Ref Range Status   C Diff antigen NEGATIVE NEGATIVE Final   C Diff toxin NEGATIVE NEGATIVE Final   C Diff interpretation No C. difficile detected.  Final    Comment: Performed at [facility], [address] [place], [place] [address]  Resp Panel by RT-PCR (Flu A&B, Covid) Nasopharyngeal Swab     Status: None   Collection Time: 05/23/20  1:00 AM   Specimen: Nasopharyngeal Swab; Nasopharyngeal(NP) swabs in vial transport medium  Result Value Ref Range Status   SARS Coronavirus 2 by RT PCR NEGATIVE NEGATIVE Final    Comment: (NOTE) SARS-CoV-2 target nucleic acids are NOT DETECTED.  The SARS-CoV-2 RNA is generally detectable in upper respiratory specimens during the acute phase of infection. The lowest concentration of SARS-CoV-2 viral copies this assay can detect is 138 copies/mL. A negative result does not preclude SARS-Cov-2 infection and should not be used as the sole basis for treatment or other patient management decisions. A negative result may occur with  improper specimen collection/handling, submission of specimen other than nasopharyngeal swab, presence of viral mutation(s) within the areas targeted by this assay, and inadequate number of viral copies(<138 copies/mL). A negative result must be combined with clinical observations, patient history, and  epidemiological information. The expected result is Negative.  Fact Sheet for Patients:  EntrepreneurPulse.com.au  Fact Sheet for Healthcare Providers:  IncredibleEmployment.be  This test is no t yet approved or cleared by the [place] FDA and  has been authorized for detection and/or diagnosis of SARS-CoV-2 by FDA under an Emergency Use Authorization (EUA). This EUA will remain  in effect (meaning this test can be used) for the duration of the COVID-19 declaration under Section 564(b)(1) of the Act, 21 U.S.C.section 360bbb-3(b)(1), unless the authorization is terminated  or revoked sooner.       Influenza A by PCR NEGATIVE NEGATIVE Final   Influenza B by PCR NEGATIVE NEGATIVE Final    Comment: (NOTE) The Xpert Xpress SARS-CoV-2/FLU/RSV plus assay is intended as an aid in the diagnosis of influenza from Nasopharyngeal swab specimens and should not be used as a sole basis for treatment. Nasal washings and aspirates are unacceptable for Xpert Xpress SARS-CoV-2/FLU/RSV testing.  Fact Sheet for Patients: EntrepreneurPulse.com.au  Fact Sheet for Healthcare Providers: IncredibleEmployment.be  This test is not yet approved or cleared by the [place] FDA and has been authorized for detection and/or diagnosis of SARS-CoV-2 by FDA under an Emergency Use Authorization (EUA). This EUA will remain in effect (meaning this test can be used) for the duration of the COVID-19 declaration under Section 564(b)(1) of the Act, 21 U.S.C. section 360bbb-3(b)(1), unless the authorization is terminated or revoked.  Performed at [facility], [address], [place], [place] [address]   MRSA PCR Screening     Status: None   Collection Time: 05/23/20  2:53 PM   Specimen: Nasal Mucosa; Nasopharyngeal  Result Value Ref Range Status   MRSA by PCR NEGATIVE NEGATIVE Final    Comment:        The GeneXpert MRSA Assay  (FDA approved for NASAL specimens only), is one component of a comprehensive MRSA colonization surveillance program. It is not intended to diagnose MRSA infection nor to guide or monitor treatment for MRSA infections. Performed at [facility], [address], [place], [place] [address]   Gastrointestinal Panel by PCR , Stool     Status: None   Collection Time: 05/26/20  1:40 AM   Specimen: Stool  Result Value Ref Range Status   Campylobacter species NOT DETECTED NOT DETECTED Final   Plesimonas shigelloides NOT DETECTED NOT DETECTED Final   Salmonella species NOT DETECTED NOT DETECTED Final   Yersinia enterocolitica NOT DETECTED NOT DETECTED Final   Vibrio species NOT DETECTED NOT DETECTED Final   Vibrio cholerae NOT DETECTED NOT DETECTED Final   Enteroaggregative E coli (EAEC) NOT DETECTED NOT DETECTED Final   Enteropathogenic E coli (EPEC) NOT DETECTED NOT DETECTED Final   Enterotoxigenic E coli (ETEC) NOT DETECTED NOT DETECTED Final   Shiga like toxin producing E coli (STEC) NOT DETECTED NOT DETECTED Final   Shigella/Enteroinvasive E coli (EIEC) NOT DETECTED NOT DETECTED Final   Cryptosporidium NOT DETECTED NOT DETECTED Final   Cyclospora cayetanensis NOT DETECTED NOT DETECTED Final   Entamoeba histolytica NOT DETECTED NOT DETECTED Final   Giardia lamblia NOT DETECTED NOT DETECTED Final   Adenovirus F40/41 NOT DETECTED NOT DETECTED Final   Astrovirus NOT DETECTED NOT DETECTED Final   Norovirus GI/GII NOT DETECTED NOT DETECTED Final   Rotavirus A NOT DETECTED NOT DETECTED Final   Sapovirus (I, II, IV, and V) NOT DETECTED NOT DETECTED Final    Comment: Performed at [facility], [address] [place], [place] [address]    Time coordinating discharge: 37 minutes   SIGNED:  Irwin Brakeman, MD  [facility] 06/01/2020, 11:01 AM How to contact the [facility] Attending or Consulting provider 7A - 7P or covering provider during after hours 7P -7A, for this patient?   1. Check the care team in [facility] and look for a) attending/consulting TR[facility] provider listed and b) the [facility] team listed 2. Log into [email or web address] and use [place] universal password to access. If you do not have the password, please contact the hospital operator. 3. Locate the [facility] provider you are looking for under [facility] and page to a number that you can be directly reached. 4. If you still have difficulty reaching the provider, please page the [facility] (Director on Call) for the Hospitalists listed on amion for assistance.

## 2020-06-01 NOTE — TOC Transition Note (Signed)
Transition of Care [facility]) - CM/SW Discharge Note   Patient Details  Name: Jeffrey Sherman MRN: [id number] Date of Birth: 11/02/1929  Transition of [facility]) CM/SW Contact:  Natasha Bence, LCSW Phone Number: 06/01/2020, 12:07 PM   Clinical Narrative:    CSW spoked to [facility] with [facility] to inquire if patient is able to return to [facility]. Kerri agreeable to take patient on 06/01/2020. TOC signing off.    Final next level of care: Skilled Nursing Facility Barriers to Discharge: Barriers Resolved   Patient Goals and CMS Choice Patient states their goals for this hospitalization and ongoing recovery are:: Return to SNF CMS Medicare.gov Compare Post Acute Care list provided to:: Patient Choice offered to / list presented to : Patient  Discharge Placement   Existing PASRR number confirmed : 06/01/20            Patient to be transferred to facility by: [facility] Name of family member notified: Pruiett,[name] Patient and family notified of of transfer: 06/01/20  Discharge Plan and Services In-house Referral: Clinical Social Work   Post Acute Care Choice: Resumption of Svcs/PTA Provider                    HH Arranged: NA HH Agency: NA        Social Determinants of Health (SDOH) Interventions     Readmission Risk Interventions Readmission Risk Prevention Plan 05/24/2020  Transportation Screening Complete  Medication Review Press photographer) [place] Complete  Some recent data might be hidden

## 2020-06-01 NOTE — Discharge Instructions (Signed)

## 2020-06-01 NOTE — Plan of Care (Signed)

## 2020-06-02 ENCOUNTER — Other Ambulatory Visit (HOSPITAL_COMMUNITY)
Admission: RE | Admit: 2020-06-02 | Discharge: 2020-06-02 | Disposition: A | Discharge status: Home / Self Care | Payer: Medicare Other | Source: Skilled Nursing Facility | Attending: Adult Health | Admitting: Adult Health

## 2020-06-02 DIAGNOSIS — K922 Gastrointestinal hemorrhage, unspecified: Secondary | ICD-10-CM | POA: Insufficient documentation

## 2020-06-02 LAB — CBC
HCT: 35.8 % — ABNORMAL LOW (ref 39.0–52.0)
Hemoglobin: 11.2 g/dL — ABNORMAL LOW (ref 13.0–17.0)
MCH: 25.9 pg — ABNORMAL LOW (ref 26.0–34.0)
MCHC: 31.3 g/dL (ref 30.0–36.0)
MCV: 82.9 fL (ref 80.0–100.0)
Platelets: 195 10*3/uL (ref 150–400)
RBC: 4.32 MIL/uL (ref 4.22–5.81)
RDW: 15 % (ref 11.5–15.5)
WBC: 10.4 10*3/uL (ref 4.0–10.5)
nRBC: 0 % (ref 0.0–0.2)

## 2020-06-02 LAB — BASIC METABOLIC PANEL
Anion gap: 8 (ref 5–15)
BUN: 22 mg/dL (ref 8–23)
CO2: 25 mmol/L (ref 22–32)
Calcium: 9 mg/dL (ref 8.9–10.3)
Chloride: 105 mmol/L (ref 98–111)
Creatinine, Ser: 1.25 mg/dL — ABNORMAL HIGH (ref 0.61–1.24)
GFR, Estimated: 55 mL/min — ABNORMAL LOW (ref >60)
Glucose, Bld: 134 mg/dL — ABNORMAL HIGH (ref 70–99)
Potassium: 3.6 mmol/L (ref 3.5–5.1)
Sodium: 138 mmol/L (ref 135–145)

## 2020-06-05 ENCOUNTER — Other Ambulatory Visit: Payer: Self-pay

## 2020-06-05 ENCOUNTER — Encounter (HOSPITAL_COMMUNITY)
Admission: RE | Admit: 2020-06-05 | Discharge: 2020-06-05 | Disposition: A | Discharge status: Home / Self Care | Payer: Medicare Other | Source: Skilled Nursing Facility | Attending: Adult Health | Admitting: Adult Health

## 2020-06-05 ENCOUNTER — Ambulatory Visit (HOSPITAL_COMMUNITY)
Admission: RE | Admit: 2020-06-05 | Discharge: 2020-06-05 | Disposition: A | Discharge status: Home / Self Care | Payer: Medicare Other | Source: Ambulatory Visit | Attending: Adult Health | Admitting: Adult Health

## 2020-06-05 ENCOUNTER — Encounter: Payer: Self-pay | Admitting: Adult Health

## 2020-06-05 ENCOUNTER — Non-Acute Institutional Stay (SKILLED_NURSING_FACILITY): Payer: Medicare Other | Admitting: Adult Health

## 2020-06-05 ENCOUNTER — Other Ambulatory Visit (HOSPITAL_COMMUNITY): Payer: Self-pay | Admitting: Adult Health

## 2020-06-05 DIAGNOSIS — C61 Malignant neoplasm of prostate: Secondary | ICD-10-CM | POA: Diagnosis not present

## 2020-06-05 DIAGNOSIS — S72001A Fracture of unspecified part of neck of right femur, initial encounter for closed fracture: Secondary | ICD-10-CM | POA: Diagnosis not present

## 2020-06-05 DIAGNOSIS — K529 Noninfective gastroenteritis and colitis, unspecified: Secondary | ICD-10-CM | POA: Diagnosis not present

## 2020-06-05 DIAGNOSIS — D508 Other iron deficiency anemias: Secondary | ICD-10-CM

## 2020-06-05 DIAGNOSIS — F0390 Unspecified dementia without behavioral disturbance: Secondary | ICD-10-CM

## 2020-06-05 DIAGNOSIS — R109 Unspecified abdominal pain: Secondary | ICD-10-CM

## 2020-06-05 DIAGNOSIS — D62 Acute posthemorrhagic anemia: Secondary | ICD-10-CM | POA: Diagnosis not present

## 2020-06-05 DIAGNOSIS — A419 Sepsis, unspecified organism: Secondary | ICD-10-CM

## 2020-06-05 DIAGNOSIS — E785 Hyperlipidemia, unspecified: Secondary | ICD-10-CM

## 2020-06-05 DIAGNOSIS — J849 Interstitial pulmonary disease, unspecified: Secondary | ICD-10-CM

## 2020-06-05 DIAGNOSIS — S72001D Fracture of unspecified part of neck of right femur, subsequent encounter for closed fracture with routine healing: Secondary | ICD-10-CM | POA: Diagnosis present

## 2020-06-05 DIAGNOSIS — I10 Essential (primary) hypertension: Secondary | ICD-10-CM

## 2020-06-05 DIAGNOSIS — E876 Hypokalemia: Secondary | ICD-10-CM

## 2020-06-05 DIAGNOSIS — K922 Gastrointestinal hemorrhage, unspecified: Secondary | ICD-10-CM | POA: Insufficient documentation

## 2020-06-05 DIAGNOSIS — R197 Diarrhea, unspecified: Secondary | ICD-10-CM | POA: Diagnosis not present

## 2020-06-05 DIAGNOSIS — N3281 Overactive bladder: Secondary | ICD-10-CM

## 2020-06-05 DIAGNOSIS — K5909 Other constipation: Secondary | ICD-10-CM | POA: Diagnosis not present

## 2020-06-05 DIAGNOSIS — K8020 Calculus of gallbladder without cholecystitis without obstruction: Secondary | ICD-10-CM | POA: Diagnosis not present

## 2020-06-05 DIAGNOSIS — G2581 Restless legs syndrome: Secondary | ICD-10-CM | POA: Diagnosis present

## 2020-06-05 DIAGNOSIS — F339 Major depressive disorder, recurrent, unspecified: Secondary | ICD-10-CM

## 2020-06-05 DIAGNOSIS — K219 Gastro-esophageal reflux disease without esophagitis: Secondary | ICD-10-CM | POA: Diagnosis not present

## 2020-06-05 DIAGNOSIS — K6389 Other specified diseases of intestine: Secondary | ICD-10-CM | POA: Diagnosis not present

## 2020-06-05 DIAGNOSIS — K5730 Diverticulosis of large intestine without perforation or abscess without bleeding: Secondary | ICD-10-CM | POA: Diagnosis not present

## 2020-06-05 LAB — BASIC METABOLIC PANEL
Anion gap: 8 (ref 5–15)
BUN: 35 mg/dL — ABNORMAL HIGH (ref 8–23)
CO2: 21 mmol/L — ABNORMAL LOW (ref 22–32)
Calcium: 8.9 mg/dL (ref 8.9–10.3)
Chloride: 108 mmol/L (ref 98–111)
Creatinine, Ser: 1.26 mg/dL — ABNORMAL HIGH (ref 0.61–1.24)
GFR, Estimated: 55 mL/min — ABNORMAL LOW (ref >60)
Glucose, Bld: 125 mg/dL — ABNORMAL HIGH (ref 70–99)
Potassium: 3.5 mmol/L (ref 3.5–5.1)
Sodium: 137 mmol/L (ref 135–145)

## 2020-06-05 LAB — CBC
HCT: 32.9 % — ABNORMAL LOW (ref 39.0–52.0)
Hemoglobin: 10.2 g/dL — ABNORMAL LOW (ref 13.0–17.0)
MCH: 25.9 pg — ABNORMAL LOW (ref 26.0–34.0)
MCHC: 31 g/dL (ref 30.0–36.0)
MCV: 83.5 fL (ref 80.0–100.0)
Platelets: 266 10*3/uL (ref 150–400)
RBC: 3.94 MIL/uL — ABNORMAL LOW (ref 4.22–5.81)
RDW: 15.3 % (ref 11.5–15.5)
WBC: 13.6 10*3/uL — ABNORMAL HIGH (ref 4.0–10.5)
nRBC: 0 % (ref 0.0–0.2)

## 2020-06-05 NOTE — Progress Notes (Signed)
Location:  [facility] Room Number: 156/D Place of Service:  SNF (31)   CODE STATUS: DNR  No Known Allergies  Chief Complaint  Patient presents with  . Hospitalization Follow-up    Hospitalization Follow Up     HPI:  He is a 86 year old long term resident of this facility who has had a right hip fracture with an ORIF in Oct of this year who has been hospitalized from 05-22-20 through 06-01-20. He had developed diarrhea with nausea and vomiting. He presented to the ED with n/v/d with abdominal pain and blood in stool. His CT scan demonstrated: distal transverse splenic flexure and sigmoid colon colitis. He was admitted with sepsis due to colitis; which has resolved he was treated with 8 days of IV abt which completed on 05-30-20. He is tolerating po. He tells me that he is weak; he continues to have diarrheal stools with blood present. His po intake is poor; is complaining of nausea; abdominal pain. Repeat Ct scan today did not show any acute findings. He will continue to be followed for his chronic illnesses including: Closed displaced fracture of right femoral neck:    Essential hypertension:  Gastroesophageal reflux disease without esophagitis:  Past Medical History:  Diagnosis Date  . Anxiety   . Cancer [facility])    prostate  . Chronic chest wall pain   . Essential hypertension, benign 06/20/2016  . GAD (generalized anxiety disorder)   . GERD (gastroesophageal reflux disease)   . H/O echocardiogram 2016   normal  . Heart murmur    "leaking heart valve"  . High cholesterol 06/20/2016  . HOH (hard of hearing)    left  . Hx of falling   . Hyperlipidemia   . Interstitial pulmonary disease ([place])   . MDD (major depressive disorder)   . Murmur, cardiac   . Poor historian   . PUD (peptic ulcer disease)   . PUD (peptic ulcer disease)   . RLS (restless legs syndrome)   . Shortness of breath   . Wrist fracture 12/16/2011    Past Surgical History:  Procedure  Laterality Date  . COLONOSCOPY  2005   Rehman: Sigmoid colon diverticulosis, moderate sized external hemorrhoids. A few focal areas of mucosal erythema felt to be nonspecific.  Marland Kitchen ESOPHAGOGASTRODUODENOSCOPY    . ESOPHAGOGASTRODUODENOSCOPY N/A 07/18/2016   Rehman: normal exam except duodenal scar. h.pylori serologies negative  . HIP PINNING,CANNULATED Right 03/10/2020   Procedure: INTERNAL FIXATION RIGHT HIP;  Surgeon: Carole Civil, MD;  Location: [facility];  Service: Orthopedics;  Laterality: Right;  . ORIF WRIST FRACTURE  12/19/2011   Procedure: OPEN REDUCTION INTERNAL FIXATION (ORIF) WRIST FRACTURE;  Surgeon: Carole Civil, MD;  Location: [facility];  Service: Orthopedics;  Laterality: Right;  . prostate cancer     Diagnosed in the 90s  . TONSILLECTOMY      Social History   Socioeconomic History  . Marital status: Widowed    Spouse name: Not on file  . Number of children: Not on file  . Years of education: Not on file  . Highest education level: Not on file  Occupational History  . Occupation: retired    Fish farm manager: RETIRED  Tobacco Use  . Smoking status: Never Smoker  . Smokeless tobacco: Never Used  Vaping Use  . Vaping Use: Never used  Substance and Sexual Activity  . Alcohol use: No  . Drug use: No  . Sexual activity: Not on file  Other Topics  Concern  . Not on file  Social History Narrative  . Not on file   Social Determinants of Health   Financial Resource Strain: Not on file  Food Insecurity: Not on file  Transportation Needs: Not on file  Physical Activity: Not on file  Stress: Not on file  Social Connections: Not on file  Intimate Partner Violence: Not on file   Family History  Problem Relation Age of Onset  . Cancer Other   . Heart attack Mother   . Cancer Brother   . Heart attack Brother   . Heart disease Sister        by pass      VITAL SIGNS BP 134/60   Pulse 67   Temp 98.9 F (37.2 C)   Resp 18   Ht 5\' 9"  (1.753 m)   Wt 186 lb 9.6 oz  (84.6 kg)   SpO2 95%   BMI 27.56 kg/m   Outpatient Encounter Medications as of 06/05/2020  Medication Sig  . acetaminophen (TYLENOL) 500 MG tablet Take 1,000 mg by mouth in the morning, at noon, and at bedtime.  Marland Kitchen alfuzosin (UROXATRAL) 10 MG 24 hr tablet Take 10 mg by mouth daily with breakfast.  . Amino Acids-Protein Hydrolys (PRO-STAT PO) liquid; 15-100 gram-kcal/30 mL; amt: 30 ml; oral Special Instructions: for albumin 2.5 With Meals  . Ascorbic Acid (VITAMIN C) 1000 MG tablet Take 1,000 mg by mouth daily.  Marland Kitchen aspirin 81 MG chewable tablet Chew 81 mg by mouth daily.  Roseanne Kaufman [place]-Castor Oil [facility]) OINT Special Instructions: Apply to bilateral buttocks & sacral area qshift for blanchable erythema.  . calcium carbonate (TUMS - DOSED IN MG ELEMENTAL CALCIUM) 500 MG chewable tablet Chew 1 tablet by mouth 2 (two) times daily as needed for indigestion or heartburn.   . diltiazem (CARDIZEM CD) 360 MG 24 hr capsule Take 360 mg by mouth daily. Start 03/23/2020  . ferrous sulfate 325 (65 FE) MG tablet Take 325 mg by mouth daily with breakfast. For Anemia and  Iron Deficiency  . Flax Oil-Fish Oil-Borage Oil (FISH OIL-FLAX OIL-BORAGE OIL) CAPS Take 1 capsule by mouth daily.  . hydrALAZINE (APRESOLINE) 100 MG tablet Take 100 mg by mouth 3 (three) times daily.  Marland Kitchen lisinopril (ZESTRIL) 40 MG tablet Take 1 tablet (40 mg total) by mouth daily.  Marland Kitchen loperamide (IMODIUM A-D) 2 MG tablet amt: 2mg ; oral Special Instructions: after each stool do not exceed more than 4tabs in 24 hours, As Needed   amt: 4mg ; oral Special Instructions: don't exceed 4mg  in 24hours As Needed  . Melatonin 10 MG TABS Take 10 mg by mouth at bedtime.  . memantine (NAMENDA) 10 MG tablet Take 10 mg by mouth 2 (two) times daily.  . metoprolol tartrate (LOPRESSOR) 25 MG tablet Take 1 tablet (25 mg total) by mouth 2 (two) times daily.  . NON FORMULARY Diet - Mechanical Soft  . omeprazole (PRILOSEC) 20 MG capsule Take 1 capsule (20 mg  total) by mouth daily before breakfast.  . OXYGEN Inhale 2 L into the lungs continuous.  . polyethylene glycol (MIRALAX / GLYCOLAX) 17 g packet Take 17 g by mouth daily as needed.  . potassium chloride SA (KLOR-CON) 20 MEQ tablet Take 20 mEq by mouth 2 (two) times daily.   Marland Kitchen rOPINIRole (REQUIP) 0.25 MG tablet Take 0.25 mg by mouth at bedtime.  . salicylic acid 17 % gel Apply 1 application topically in the morning and at bedtime. Apply to warts to left  hand  . senna-docusate (SENOKOT-S) 8.6-50 MG tablet Take 1 tablet by mouth at bedtime as needed for mild constipation.  Marland Kitchen terazosin (HYTRIN) 5 MG capsule Take 5 mg by mouth at bedtime.   Marland Kitchen venlafaxine XR (EFFEXOR-XR) 150 MG 24 hr capsule Take 75 mg by mouth daily with breakfast.  . [DISCONTINUED] cloNIDine (CATAPRES) 0.1 MG tablet Take 1 tablet (0.1 mg total) by mouth See admin instructions. Stat-immediately due to elavated blood pressure if needed  . [DISCONTINUED] loperamide (IMODIUM) 2 MG capsule Take 1 capsule (2 mg total) by mouth 3 (three) times daily as needed for diarrhea or loose stools.   No facility-administered encounter medications on file as of 06/05/2020.     SIGNIFICANT DIAGNOSTIC EXAMS   PREVIOUS   03-09-20: right hip x-ray: Minimally displaced right femoral neck fracture.  03-09-20: chest x-ray: Peribronchial thickening with streaky perihilar infiltrates suggesting bronchitis or multifocal pneumonia.  03-12-20: renal ultrasound: No acute abnormality. Bilateral renal cysts  03-12-20: chest x-ray: Lower lung volumes. No acute cardiopulmonary abnormality.    TODAY  05-22-20: chest x-ray:  1. Mild right basilar atelectasis. 2. Improved cardiomegaly from prior. Stable aortic atherosclerosis and tortuosity  05-22-20: ct of abdomen and pelvis:  Findings consistent with colitis involving the distal transverse colon, splenic flexure, and descending colon. This is likely ischemic or infectious in etiology. Diverticulosis is  present but mostly distal to the area of thickening and diverticulitis is therefore less likely. 3 mm nonobstructing left renal calculus. Small bladder diverticula.  05-26-20: ct angio of abdomen and pelvis:  1. Persistent colitis involving distal transverse colon, splenic flexure, and descending colon. 2. Mild lower abdominal ascites, slightly increased on a. 3. 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 The celiac axis and superior mesenteric artery remain widely patent however, which typically allows adequate collateral perfusion. 4. Increase in small bilateral pleural effusions and dependent atelectasis/consolidation in both lung bases since previous study. 5. Sigmoid diverticulosis. 6. 1 mm mid left ureteral calculus with stable left renal pelvicaliectasis. 7. Right nephrolithiasis. 8. Bilateral renal artery ostial stenosis of possible hemodynamic significance.  9. Aortic Atherosclerosis  06-05-20: ct of abdomen and pelvis:  Mild colonic distension with air-fluid levels and segmental wall thickening likely reflects colitis. Additional stable findings detailed above.     LABS REVIEWED PREVIOUS    03-09-20: wbc 13.7; hgb 12.1; hct 37.7; mcv 80.4 plt 205; glucose 208; bun 21; creat 1.50; k+ 3.2; na++ 141; ca 9.2; hgb a1c 5.9; HIV: nr 03-10-20: wbc 11.2; hgb 11.3; hct 35.9; mcv 82.7 plt 188; glucose 160; bun 18; creat 1.21; k+ 3.3; na++ 139; ca 8.8 liver normal 3.0; mag 2.3 03-12-20: wbc 10.4; hgb 9.3; hct 29.6; mcv 84.3 plt 178; glucose 138; bun 34; creat 1.77; k+ 3.8; na++ 138; ca 8.3 03-14-20: glucose 134; bun 36; creat 1.23; k+ 3.2; na++ 139; ca 8.8 mag 2.1   03-16-20: hgb 10.7; hct 33.4 glucose 127; bun 18; creat 0.86 k+ 3.3; na++ 139; ca 9.2 ;liver normal albumin 2.5 iron 23; tibc 221; ferritin 137 03-23-20: k+ 3.0  03-27-20: glucose 133; bun 34; creat 0.85; k+ 3.6; na++ 142; ca 9.0; AM cortisol: 10.6   TODAY  05-22-20: wbc 19.4; hgb 14.6; hct 43.8; mcv 81.6 plt  254; glucose 211; bun 35; creat 1.20; k+ 3.0; na++ 138; ca 9.8; liver normal albumin 3.5 mag 1.9; urine culture: <10,000; blood culture: no growth 05-25-20: wbc 14.2; hgb 20.8; hct 34.8; mcv 84.7 plt 212; glucose 109; bun 24; creat 0.86; k+  3.4; na++ 137; ca 8.9; mag 1.9 05-28-20: wbc 10.9; hgb 11.8; hct 36.8; mcv 81.8 plt 227; glucose 132; bun 10; creat 0.82; k+ 3.0; na++ 137; ca 8.7 06-01-20: wbc 12.2; hgb 12.5; hct 40.3; mcv 81.9 plt 136; glucose 136; bun 12; creat 0.81; k+ 3.0; na++ 140; ca 8.9 mag 1.9 06-05-20: wbc 13.6; hgb 10.2; hct 32.9; mcv 93.5 plt 266 glucose 125; bun 35; creat 1.26; k+ 3.5; na++ 137; ca 8.9   Review of Systems  Constitutional: Positive for malaise/fatigue.  Respiratory: Negative for cough and shortness of breath.   Cardiovascular: Negative for chest pain, palpitations and leg swelling.  Gastrointestinal: Positive for abdominal pain, diarrhea, nausea and vomiting. Negative for heartburn.  Musculoskeletal: Negative for back pain, joint pain and myalgias.  Skin: Negative.   Neurological: Negative for dizziness.  Psychiatric/Behavioral: The patient is not nervous/anxious.      Physical Exam Constitutional:      General: He is not in acute distress.    Appearance: He is well-developed and well-nourished. He is not diaphoretic.  HENT:     Ears:     Comments: HOH L>R Neck:     Thyroid: No thyromegaly.  Cardiovascular:     Rate and Rhythm: Normal rate and regular rhythm.     Pulses: Normal pulses and intact distal pulses.     Heart sounds: Normal heart sounds.  Pulmonary:     Effort: Pulmonary effort is normal. No respiratory distress.     Breath sounds: Normal breath sounds.  Abdominal:     General: Bowel sounds are normal. There is no distension.     Palpations: Abdomen is soft.     Tenderness: There is abdominal tenderness. There is no guarding.     Comments: Generalized   Musculoskeletal:        General: No edema.     Cervical back: Neck supple.      Right lower leg: No edema.     Left lower leg: No edema.     Comments:  Is able to move all extremities  Status post right hip internal fixation 03-10-20  Lymphadenopathy:     Cervical: No cervical adenopathy.  Skin:    General: Skin is warm and dry.  Neurological:     Mental Status: He is alert and oriented to person, place, and time.  Psychiatric:        Mood and Affect: Mood and affect and mood normal.     ASSESSMENT/ PLAN:  TODAY   1. Colitis: is without change he continues to have diarrheal stools; with some blood present; he has abdominal pain and weakness present. Will continue to monitor his status.   2. Closed displaced fracture of right femoral neck: is stable is status post right internal fixation: 03-10-20> will continue tylenol 1 gm three times daily     3. Essential hypertension: is stable b/p 134/60 will continue lisinopril 40 mg daily hytrin 5 mg daily hydralazine 100 mg thee times daily and cardizem cd 360 mg daily   4. Gastroesophageal reflux disease without esophagitis: is stable will continue prilosec 20 mg daily   5. Prostate cancer/overactive bladder: is stable will continue hytrin 5 mg daily urosatral 10 mg daily is off oxybutrin due to fracture  6. Renal insuffiencey: is stable Bun 35; creat 1.26   7. Hypokalemia: is stable k+ 3.5 will continue k+ 20 meq twice daily   8. Dyslipidemia: is stable will continue fish oil 1 gm daily is off zocor  9. Chronic  constipation: is stable will continue miralax and senna s daily as needed  10. Major depression recurrent chronic: is stable will continue effexor xr 150 mg daily takes melatonin 10 mg nightly for sleep   11. RLS: is stable will continue requip 0.25 mg nightly   12. Iron deficiency due to dietary intake/acute blood loss anemia: is slowly declining: hgb 10.2 will continue iron daily and will monitor his status.   13. Interstitial lung disease: is stable is off 02  14. Dementia without behavioral  disturbance unspecified dementia type.     will monitor cbc and bmp      13. Dementia without behavioral disturbance unspecified dementia type: is without change weight is 186 pounds; will continue namenda 10 mg twice daily             MD is aware of resident's narcotic use and is in agreement with current plan of care. We will attempt to wean resident as appropriate.  Ok Edwards NP [facility]  Contact [phone] Monday through Friday 8am- 5pm  After hours call [phone]

## 2020-06-06 ENCOUNTER — Encounter: Payer: Self-pay | Admitting: Adult Health

## 2020-06-06 ENCOUNTER — Non-Acute Institutional Stay (SKILLED_NURSING_FACILITY): Payer: Medicare Other | Admitting: Adult Health

## 2020-06-06 DIAGNOSIS — R197 Diarrhea, unspecified: Secondary | ICD-10-CM

## 2020-06-06 DIAGNOSIS — R112 Nausea with vomiting, unspecified: Secondary | ICD-10-CM | POA: Diagnosis not present

## 2020-06-06 DIAGNOSIS — K529 Noninfective gastroenteritis and colitis, unspecified: Secondary | ICD-10-CM | POA: Diagnosis not present

## 2020-06-06 DIAGNOSIS — B370 Candidal stomatitis: Secondary | ICD-10-CM

## 2020-06-06 NOTE — Progress Notes (Signed)
Location:  [place] Room Number: 156/D Place of Service:  SNF (31)   CODE STATUS: DNR  No Known Allergies  Chief Complaint  Patient presents with  . Acute Visit    Nausea and Vomiting     HPI:  He is complaining of nausea and vomiting. He continues to have diarrheal stools with some blood present. He does not have any appetite. There are no reports of fevers. He does have abdominal pain and tenderness present.   Past Medical History:  Diagnosis Date  . Anxiety   . Cancer [facility])    prostate  . Chronic chest wall pain   . Essential hypertension, benign 06/20/2016  . GAD (generalized anxiety disorder)   . GERD (gastroesophageal reflux disease)   . H/O echocardiogram 2016   normal  . Heart murmur    "leaking heart valve"  . High cholesterol 06/20/2016  . HOH (hard of hearing)    left  . Hx of falling   . Hyperlipidemia   . Interstitial pulmonary disease ([place])   . MDD (major depressive disorder)   . Murmur, cardiac   . Poor historian   . PUD (peptic ulcer disease)   . PUD (peptic ulcer disease)   . RLS (restless legs syndrome)   . Shortness of breath   . Wrist fracture 12/16/2011    Past Surgical History:  Procedure Laterality Date  . COLONOSCOPY  2005   Rehman: Sigmoid colon diverticulosis, moderate sized external hemorrhoids. A few focal areas of mucosal erythema felt to be nonspecific.  Marland Kitchen ESOPHAGOGASTRODUODENOSCOPY    . ESOPHAGOGASTRODUODENOSCOPY N/A 07/18/2016   Rehman: normal exam except duodenal scar. h.pylori serologies negative  . HIP PINNING,CANNULATED Right 03/10/2020   Procedure: INTERNAL FIXATION RIGHT HIP;  Surgeon: Carole Civil, MD;  Location: [facility] OR[facility];  Service: Orthopedics;  Laterality: Right;  . ORIF WRIST FRACTURE  12/19/2011   Procedure: OPEN REDUCTION INTERNAL FIXATION (ORIF) WRIST FRACTURE;  Surgeon: Carole Civil, MD;  Location: [facility] ORS;  Service: Orthopedics;  Laterality: Right;  . prostate cancer     Diagnosed in  the 90s  . TONSILLECTOMY      Social History   Socioeconomic History  . Marital status: Widowed    Spouse name: Not on file  . Number of children: Not on file  . Years of education: Not on file  . Highest education level: Not on file  Occupational History  . Occupation: retired    Fish farm manager: RETIRED  Tobacco Use  . Smoking status: Never Smoker  . Smokeless tobacco: Never Used  Vaping Use  . Vaping Use: Never used  Substance and Sexual Activity  . Alcohol use: No  . Drug use: No  . Sexual activity: Not on file  Other Topics Concern  . Not on file  Social History Narrative  . Not on file   Social Determinants of Health   Financial Resource Strain: Not on file  Food Insecurity: Not on file  Transportation Needs: Not on file  Physical Activity: Not on file  Stress: Not on file  Social Connections: Not on file  Intimate Partner Violence: Not on file   Family History  Problem Relation Age of Onset  . Cancer Other   . Heart attack Mother   . Cancer Brother   . Heart attack Brother   . Heart disease Sister        by pass      VITAL SIGNS BP 135/80   Pulse 67  Temp 98.9 F (37.2 C)   Resp 18   Ht 5\' 9"  (1.753 m)   Wt 186 lb 9.6 oz (84.6 kg)   SpO2 98%   BMI 27.56 kg/m   Outpatient Encounter Medications as of 06/06/2020  Medication Sig  . acetaminophen (TYLENOL) 500 MG tablet Take 1,000 mg by mouth in the morning, at noon, and at bedtime.  Marland Kitchen alfuzosin (UROXATRAL) 10 MG 24 hr tablet Take 10 mg by mouth daily with breakfast.  . Amino Acids-Protein Hydrolys (PRO-STAT PO) liquid; 15-100 gram-kcal/30 mL; amt: 30 ml; oral Special Instructions: for albumin 2.5 With Meals  . Ascorbic Acid (VITAMIN C) 1000 MG tablet Take 1,000 mg by mouth daily.  Marland Kitchen aspirin 81 MG chewable tablet Chew 81 mg by mouth daily.  Roseanne Kaufman [place]-Castor Oil [facility]) OINT Special Instructions: Apply to bilateral buttocks & sacral area qshift for blanchable erythema.  . calcium carbonate  (TUMS - DOSED IN MG ELEMENTAL CALCIUM) 500 MG chewable tablet Chew 1 tablet by mouth 2 (two) times daily as needed for indigestion or heartburn.   . diltiazem (CARDIZEM CD) 360 MG 24 hr capsule Take 360 mg by mouth daily. Start 03/23/2020  . ferrous sulfate 325 (65 FE) MG tablet Take 325 mg by mouth daily with breakfast. For Anemia and  Iron Deficiency  . Flax Oil-Fish Oil-Borage Oil (FISH OIL-FLAX OIL-BORAGE OIL) CAPS Take 1 capsule by mouth daily.  . hydrALAZINE (APRESOLINE) 100 MG tablet Take 100 mg by mouth 3 (three) times daily.  Marland Kitchen lisinopril (ZESTRIL) 40 MG tablet Take 1 tablet (40 mg total) by mouth daily.  Marland Kitchen loperamide (IMODIUM A-D) 2 MG tablet amt: 2mg ; oral Special Instructions: after each stool do not exceed more than 4tabs in 24 hours, As Needed  . Melatonin 10 MG TABS Take 10 mg by mouth at bedtime.  . memantine (NAMENDA) 10 MG tablet Take 10 mg by mouth 2 (two) times daily.  . metoprolol tartrate (LOPRESSOR) 25 MG tablet Take 1 tablet (25 mg total) by mouth 2 (two) times daily.  . NON FORMULARY Diet - Mechanical Soft  . nystatin (MYCOSTATIN) 100000 UNIT/ML suspension Take 5 mLs by mouth 4 (four) times daily.  Marland Kitchen omeprazole (PRILOSEC) 20 MG capsule Take 1 capsule (20 mg total) by mouth daily before breakfast.  . OXYGEN Inhale 2 L into the lungs continuous.  . polyethylene glycol (MIRALAX / GLYCOLAX) 17 g packet Take 17 g by mouth daily as needed.  . potassium chloride SA (KLOR-CON) 20 MEQ tablet Take 20 mEq by mouth 2 (two) times daily.   Marland Kitchen rOPINIRole (REQUIP) 0.25 MG tablet Take 0.25 mg by mouth at bedtime.  . salicylic acid 17 % gel Apply 1 application topically in the morning and at bedtime. Apply to warts to left hand  . senna-docusate (SENOKOT-S) 8.6-50 MG tablet Take 1 tablet by mouth at bedtime as needed for mild constipation.  . sodium chloride 0.9 % infusion Inject into the vein. Give 75 cc, Special Instructions: for acute renal failure; with physical signs of poor hydration.   Every Shift Day, Evening, Night  . terazosin (HYTRIN) 5 MG capsule Take 5 mg by mouth at bedtime.   Marland Kitchen venlafaxine XR (EFFEXOR-XR) 150 MG 24 hr capsule Take 150 mg by mouth daily with breakfast.   No facility-administered encounter medications on file as of 06/06/2020.     SIGNIFICANT DIAGNOSTIC EXAMS   PREVIOUS   03-09-20: right hip x-ray: Minimally displaced right femoral neck fracture.  03-09-20: chest x-ray: Peribronchial thickening  with streaky perihilar infiltrates suggesting bronchitis or multifocal pneumonia.  03-12-20: renal ultrasound: No acute abnormality. Bilateral renal cysts  03-12-20: chest x-ray: Lower lung volumes. No acute cardiopulmonary abnormality.   05-22-20: chest x-ray:  1. Mild right basilar atelectasis. 2. Improved cardiomegaly from prior. Stable aortic atherosclerosis and tortuosity  05-22-20: ct of abdomen and pelvis:  Findings consistent with colitis involving the distal transverse colon, splenic flexure, and descending colon. This is likely ischemic or infectious in etiology. Diverticulosis is present but mostly distal to the area of thickening and diverticulitis is therefore less likely. 3 mm nonobstructing left renal calculus. Small bladder diverticula.  05-26-20: ct angio of abdomen and pelvis:  1. Persistent colitis involving distal transverse colon, splenic flexure, and descending colon. 2. Mild lower abdominal ascites, slightly increased on a. 3. 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 The celiac axis and superior mesenteric artery remain widely patent however, which typically allows adequate collateral perfusion. 4. Increase in small bilateral pleural effusions and dependent atelectasis/consolidation in both lung bases since previous study. 5. Sigmoid diverticulosis. 6. 1 mm mid left ureteral calculus with stable left renal pelvicaliectasis. 7. Right nephrolithiasis. 8. Bilateral renal artery ostial stenosis of possible  hemodynamic significance.  9. Aortic Atherosclerosis  06-05-20: ct of abdomen and pelvis:  Mild colonic distension with air-fluid levels and segmental wall thickening likely reflects colitis. Additional stable findings detailed above.  NO NEW EXAMS.    LABS REVIEWED PREVIOUS    03-09-20: wbc 13.7; hgb 12.1; hct 37.7; mcv 80.4 plt 205; glucose 208; bun 21; creat 1.50; k+ 3.2; na++ 141; ca 9.2; hgb a1c 5.9; HIV: nr 03-10-20: wbc 11.2; hgb 11.3; hct 35.9; mcv 82.7 plt 188; glucose 160; bun 18; creat 1.21; k+ 3.3; na++ 139; ca 8.8 liver normal 3.0; mag 2.3 03-12-20: wbc 10.4; hgb 9.3; hct 29.6; mcv 84.3 plt 178; glucose 138; bun 34; creat 1.77; k+ 3.8; na++ 138; ca 8.3 03-14-20: glucose 134; bun 36; creat 1.23; k+ 3.2; na++ 139; ca 8.8 mag 2.1   03-16-20: hgb 10.7; hct 33.4 glucose 127; bun 18; creat 0.86 k+ 3.3; na++ 139; ca 9.2 ;liver normal albumin 2.5 iron 23; tibc 221; ferritin 137 03-23-20: k+ 3.0  03-27-20: glucose 133; bun 34; creat 0.85; k+ 3.6; na++ 142; ca 9.0; AM cortisol: 10.6  05-22-20: wbc 19.4; hgb 14.6; hct 43.8; mcv 81.6 plt 254; glucose 211; bun 35; creat 1.20; k+ 3.0; na++ 138; ca 9.8; liver normal albumin 3.5 mag 1.9; urine culture: <10,000; blood culture: no growth 05-25-20: wbc 14.2; hgb 20.8; hct 34.8; mcv 84.7 plt 212; glucose 109; bun 24; creat 0.86; k+ 3.4; na++ 137; ca 8.9; mag 1.9 05-28-20: wbc 10.9; hgb 11.8; hct 36.8; mcv 81.8 plt 227; glucose 132; bun 10; creat 0.82; k+ 3.0; na++ 137; ca 8.7 06-01-20: wbc 12.2; hgb 12.5; hct 40.3; mcv 81.9 plt 136; glucose 136; bun 12; creat 0.81; k+ 3.0; na++ 140; ca 8.9 mag 1.9 06-05-20: wbc 13.6; hgb 10.2; hct 32.9; mcv 93.5 plt 266 glucose 125; bun 35; creat 1.26; k+ 3.5; na++ 137; ca 8.9   NO NEW LABS.   Review of Systems  Constitutional: Positive for malaise/fatigue.  Respiratory: Negative for cough and shortness of breath.   Cardiovascular: Negative for chest pain, palpitations and leg swelling.  Gastrointestinal: Positive  for abdominal pain, diarrhea, nausea and vomiting. Negative for heartburn.  Musculoskeletal: Negative for back pain, joint pain and myalgias.  Skin: Negative.   Neurological: Negative for dizziness.  Psychiatric/Behavioral: The patient  is not nervous/anxious.     Physical Exam Constitutional:      General: He is not in acute distress.    Appearance: He is well-developed and well-nourished. He is not diaphoretic.  HENT:     Ears:     Comments: HOH L>R    Nose: Nose normal.     Mouth/Throat:     Mouth: Mucous membranes are dry.     Comments: Thrush present  Neck:     Thyroid: No thyromegaly.  Cardiovascular:     Rate and Rhythm: Normal rate and regular rhythm.     Pulses: Normal pulses and intact distal pulses.     Heart sounds: Normal heart sounds.  Pulmonary:     Effort: Pulmonary effort is normal. No respiratory distress.     Breath sounds: Normal breath sounds.  Abdominal:     General: Bowel sounds are normal. There is no distension.     Palpations: Abdomen is soft.     Tenderness: There is abdominal tenderness.  Musculoskeletal:        General: No edema. Normal range of motion.     Cervical back: Neck supple.     Right lower leg: No edema.     Left lower leg: No edema.  Lymphadenopathy:     Cervical: No cervical adenopathy.  Skin:    General: Skin is warm and dry.  Neurological:     Mental Status: He is alert and oriented to person, place, and time.  Psychiatric:        Mood and Affect: Mood and affect and mood normal.      ASSESSMENT/ PLAN:  TODAY  1. Colitis  2. Oral thrush 3. Nausea, vomiting and diarrhea  Will begin nystatin 5 cc swish and swallow four times daily for one week Will begin NS at 75 cc per hour Will get cbc cmp in AM.     MD is aware of resident's narcotic use and is in agreement with current plan of care. We will attempt to wean resident as appropriate.  Ok Edwards NP [facility]  Contact [phone] Monday  through Friday 8am- 5pm  After hours call [phone]

## 2020-06-07 ENCOUNTER — Other Ambulatory Visit (HOSPITAL_COMMUNITY)
Admission: RE | Admit: 2020-06-07 | Discharge: 2020-06-07 | Disposition: A | Discharge status: Home / Self Care | Payer: Medicare Other | Source: Skilled Nursing Facility | Attending: Adult Health | Admitting: Adult Health

## 2020-06-07 ENCOUNTER — Non-Acute Institutional Stay (SKILLED_NURSING_FACILITY): Payer: Medicare Other | Admitting: Adult Health

## 2020-06-07 ENCOUNTER — Encounter: Payer: Self-pay | Admitting: Adult Health

## 2020-06-07 DIAGNOSIS — R197 Diarrhea, unspecified: Secondary | ICD-10-CM

## 2020-06-07 DIAGNOSIS — K529 Noninfective gastroenteritis and colitis, unspecified: Secondary | ICD-10-CM

## 2020-06-07 DIAGNOSIS — R112 Nausea with vomiting, unspecified: Secondary | ICD-10-CM | POA: Diagnosis not present

## 2020-06-07 DIAGNOSIS — I10 Essential (primary) hypertension: Secondary | ICD-10-CM | POA: Insufficient documentation

## 2020-06-07 DIAGNOSIS — D508 Other iron deficiency anemias: Secondary | ICD-10-CM

## 2020-06-07 DIAGNOSIS — N179 Acute kidney failure, unspecified: Secondary | ICD-10-CM

## 2020-06-07 DIAGNOSIS — E43 Unspecified severe protein-calorie malnutrition: Secondary | ICD-10-CM | POA: Diagnosis not present

## 2020-06-07 LAB — CBC WITH DIFFERENTIAL/PLATELET
Band Neutrophils: 11 %
Basophils Absolute: 0 10*3/uL (ref 0.0–0.1)
Basophils Relative: 0 %
Eosinophils Absolute: 0 10*3/uL (ref 0.0–0.5)
Eosinophils Relative: 0 %
HCT: 29.8 % — ABNORMAL LOW (ref 39.0–52.0)
Hemoglobin: 9.6 g/dL — ABNORMAL LOW (ref 13.0–17.0)
Lymphocytes Relative: 12 %
Lymphs Abs: 1.4 10*3/uL (ref 0.7–4.0)
MCH: 26.7 pg (ref 26.0–34.0)
MCHC: 32.2 g/dL (ref 30.0–36.0)
MCV: 82.8 fL (ref 80.0–100.0)
Monocytes Absolute: 1.5 10*3/uL — ABNORMAL HIGH (ref 0.1–1.0)
Monocytes Relative: 13 %
Neutro Abs: 8.9 10*3/uL — ABNORMAL HIGH (ref 1.7–7.7)
Neutrophils Relative %: 64 %
Platelets: 318 10*3/uL (ref 150–400)
RBC: 3.6 MIL/uL — ABNORMAL LOW (ref 4.22–5.81)
RDW: 15.4 % (ref 11.5–15.5)
WBC: 11.9 10*3/uL — ABNORMAL HIGH (ref 4.0–10.5)
nRBC: 0 % (ref 0.0–0.2)

## 2020-06-07 LAB — COMPREHENSIVE METABOLIC PANEL
ALT: 11 U/L (ref 0–44)
AST: 9 U/L — ABNORMAL LOW (ref 15–41)
Albumin: 2.1 g/dL — ABNORMAL LOW (ref 3.5–5.0)
Alkaline Phosphatase: 68 U/L (ref 38–126)
Anion gap: 8 (ref 5–15)
BUN: 42 mg/dL — ABNORMAL HIGH (ref 8–23)
CO2: 20 mmol/L — ABNORMAL LOW (ref 22–32)
Calcium: 8.8 mg/dL — ABNORMAL LOW (ref 8.9–10.3)
Chloride: 109 mmol/L (ref 98–111)
Creatinine, Ser: 1.47 mg/dL — ABNORMAL HIGH (ref 0.61–1.24)
GFR, Estimated: 46 mL/min — ABNORMAL LOW (ref >60)
Glucose, Bld: 114 mg/dL — ABNORMAL HIGH (ref 70–99)
Potassium: 3.9 mmol/L (ref 3.5–5.1)
Sodium: 137 mmol/L (ref 135–145)
Total Bilirubin: 0.2 mg/dL — ABNORMAL LOW (ref 0.3–1.2)
Total Protein: 4.9 g/dL — ABNORMAL LOW (ref 6.5–8.1)

## 2020-06-07 NOTE — Progress Notes (Signed)
Location:  [place] Room Number: 156/D Place of Service:  SNF (31)   CODE STATUS: DNR  No Known Allergies  Chief Complaint  Patient presents with  . Follow-up    Follow Up Labs     HPI:  He continues on NS at 75 cc per hour. There is little change in his renal labs. His albumin level is 2.1 he is presently taking prostat three times daily. He is sitting in wheelchair today. He states that he is tired and has little energy. He continues to have nausea. His appetite remains poor.   Past Medical History:  Diagnosis Date  . Anxiety   . Cancer [facility])    prostate  . Chronic chest wall pain   . Essential hypertension, benign 06/20/2016  . GAD (generalized anxiety disorder)   . GERD (gastroesophageal reflux disease)   . H/O echocardiogram 2016   normal  . Heart murmur    "leaking heart valve"  . High cholesterol 06/20/2016  . HOH (hard of hearing)    left  . Hx of falling   . Hyperlipidemia   . Interstitial pulmonary disease ([place])   . MDD (major depressive disorder)   . Murmur, cardiac   . Poor historian   . PUD (peptic ulcer disease)   . PUD (peptic ulcer disease)   . RLS (restless legs syndrome)   . Shortness of breath   . Wrist fracture 12/16/2011    Past Surgical History:  Procedure Laterality Date  . COLONOSCOPY  2005   Rehman: Sigmoid colon diverticulosis, moderate sized external hemorrhoids. A few focal areas of mucosal erythema felt to be nonspecific.  Marland Kitchen ESOPHAGOGASTRODUODENOSCOPY    . ESOPHAGOGASTRODUODENOSCOPY N/A 07/18/2016   Rehman: normal exam except duodenal scar. h.pylori serologies negative  . HIP PINNING,CANNULATED Right 03/10/2020   Procedure: INTERNAL FIXATION RIGHT HIP;  Surgeon: Carole Civil, MD;  Location: [facility];  Service: Orthopedics;  Laterality: Right;  . ORIF WRIST FRACTURE  12/19/2011   Procedure: OPEN REDUCTION INTERNAL FIXATION (ORIF) WRIST FRACTURE;  Surgeon: Carole Civil, MD;  Location: [facility];  Service:  Orthopedics;  Laterality: Right;  . prostate cancer     Diagnosed in the 90s  . TONSILLECTOMY      Social History   Socioeconomic History  . Marital status: Widowed    Spouse name: Not on file  . Number of children: Not on file  . Years of education: Not on file  . Highest education level: Not on file  Occupational History  . Occupation: retired    Fish farm manager: RETIRED  Tobacco Use  . Smoking status: Never Smoker  . Smokeless tobacco: Never Used  Vaping Use  . Vaping Use: Never used  Substance and Sexual Activity  . Alcohol use: No  . Drug use: No  . Sexual activity: Not on file  Other Topics Concern  . Not on file  Social History Narrative  . Not on file   Social Determinants of Health   Financial Resource Strain: Not on file  Food Insecurity: Not on file  Transportation Needs: Not on file  Physical Activity: Not on file  Stress: Not on file  Social Connections: Not on file  Intimate Partner Violence: Not on file   Family History  Problem Relation Age of Onset  . Cancer Other   . Heart attack Mother   . Cancer Brother   . Heart attack Brother   . Heart disease Sister  by pass      VITAL SIGNS BP (!) 103/50   Pulse 67   Temp 98.9 F (37.2 C)   Resp 18   Ht 5\' 9"  (1.753 m)   Wt 186 lb 9.6 oz (84.6 kg)   SpO2 96%   BMI 27.56 kg/m   Outpatient Encounter Medications as of 06/07/2020  Medication Sig  . acetaminophen (TYLENOL) 500 MG tablet Take 1,000 mg by mouth in the morning, at noon, and at bedtime.  Marland Kitchen alfuzosin (UROXATRAL) 10 MG 24 hr tablet Take 10 mg by mouth daily with breakfast.  . Amino Acids-Protein Hydrolys (PRO-STAT PO) liquid; 15-100 gram-kcal/30 mL; amt: 30 ml; oral Special Instructions: for albumin 2.5 With Meals  . Ascorbic Acid (VITAMIN C) 1000 MG tablet Take 1,000 mg by mouth daily.  Marland Kitchen aspirin 81 MG chewable tablet Chew 81 mg by mouth daily.  Roseanne Kaufman [place]-Castor Oil [facility]) OINT Special Instructions: Apply to bilateral  buttocks & sacral area qshift for blanchable erythema.  . calcium carbonate (TUMS - DOSED IN MG ELEMENTAL CALCIUM) 500 MG chewable tablet Chew 1 tablet by mouth 2 (two) times daily as needed for indigestion or heartburn.   . diltiazem (CARDIZEM CD) 360 MG 24 hr capsule Take 360 mg by mouth daily. Start 03/23/2020  . ferrous sulfate 325 (65 FE) MG tablet Take 325 mg by mouth daily with breakfast. For Anemia and  Iron Deficiency  . Flax Oil-Fish Oil-Borage Oil (FISH OIL-FLAX OIL-BORAGE OIL) CAPS Take 1 capsule by mouth daily.  . hydrALAZINE (APRESOLINE) 100 MG tablet Take 100 mg by mouth 3 (three) times daily.  Marland Kitchen lisinopril (ZESTRIL) 40 MG tablet Take 1 tablet (40 mg total) by mouth daily.  Marland Kitchen loperamide (IMODIUM A-D) 2 MG tablet amt: 2mg ; oral Special Instructions: after each stool do not exceed more than 4tabs in 24 hours, As Needed  . Melatonin 10 MG TABS Take 10 mg by mouth at bedtime.  . memantine (NAMENDA) 10 MG tablet Take 10 mg by mouth 2 (two) times daily.  . metoprolol tartrate (LOPRESSOR) 25 MG tablet Take 1 tablet (25 mg total) by mouth 2 (two) times daily.  . NON FORMULARY Diet - Mechanical Soft  . nystatin (MYCOSTATIN) 100000 UNIT/ML suspension Take 5 mLs by mouth 4 (four) times daily. For Oral Thrush  . omeprazole (PRILOSEC) 20 MG capsule Take 1 capsule (20 mg total) by mouth daily before breakfast.  . ondansetron (ZOFRAN-ODT) 8 MG disintegrating tablet Take 8 mg by mouth 3 (three) times daily before meals. For vomiting and nausea  . OXYGEN Inhale 2 L into the lungs continuous.  . polyethylene glycol (MIRALAX / GLYCOLAX) 17 g packet Take 17 g by mouth daily as needed.  . potassium chloride SA (KLOR-CON) 20 MEQ tablet Take 20 mEq by mouth 2 (two) times daily.   Marland Kitchen rOPINIRole (REQUIP) 0.25 MG tablet Take 0.25 mg by mouth at bedtime.  . salicylic acid 17 % gel Apply 1 application topically in the morning and at bedtime. Apply to warts to left hand  . senna-docusate (SENOKOT-S) 8.6-50 MG  tablet Take 1 tablet by mouth at bedtime as needed for mild constipation.  . sodium chloride 0.9 % infusion Inject into the vein. Give 75 cc, Special Instructions: for acute renal failure; with physical signs of poor hydration.  Every Shift Day, Evening, Night  . terazosin (HYTRIN) 5 MG capsule Take 5 mg by mouth at bedtime.   Marland Kitchen venlafaxine XR (EFFEXOR-XR) 150 MG 24 hr capsule Take 150 mg  by mouth daily with breakfast.   No facility-administered encounter medications on file as of 06/07/2020.     SIGNIFICANT DIAGNOSTIC EXAMS   PREVIOUS   03-09-20: right hip x-ray: Minimally displaced right femoral neck fracture.  03-09-20: chest x-ray: Peribronchial thickening with streaky perihilar infiltrates suggesting bronchitis or multifocal pneumonia.  03-12-20: renal ultrasound: No acute abnormality. Bilateral renal cysts  03-12-20: chest x-ray: Lower lung volumes. No acute cardiopulmonary abnormality.   05-22-20: chest x-ray:  1. Mild right basilar atelectasis. 2. Improved cardiomegaly from prior. Stable aortic atherosclerosis and tortuosity  05-22-20: ct of abdomen and pelvis:  Findings consistent with colitis involving the distal transverse colon, splenic flexure, and descending colon. This is likely ischemic or infectious in etiology. Diverticulosis is present but mostly distal to the area of thickening and diverticulitis is therefore less likely. 3 mm nonobstructing left renal calculus. Small bladder diverticula.  05-26-20: ct angio of abdomen and pelvis:  1. Persistent colitis involving distal transverse colon, splenic flexure, and descending colon. 2. Mild lower abdominal ascites, slightly increased on a. 3. 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 The celiac axis and superior mesenteric artery remain widely patent however, which typically allows adequate collateral perfusion. 4. Increase in small bilateral pleural effusions and dependent atelectasis/consolidation in  both lung bases since previous study. 5. Sigmoid diverticulosis. 6. 1 mm mid left ureteral calculus with stable left renal pelvicaliectasis. 7. Right nephrolithiasis. 8. Bilateral renal artery ostial stenosis of possible hemodynamic significance.  9. Aortic Atherosclerosis  06-05-20: ct of abdomen and pelvis:  Mild colonic distension with air-fluid levels and segmental wall thickening likely reflects colitis. Additional stable findings detailed above.  NO NEW EXAMS.    LABS REVIEWED PREVIOUS    03-09-20: wbc 13.7; hgb 12.1; hct 37.7; mcv 80.4 plt 205; glucose 208; bun 21; creat 1.50; k+ 3.2; na++ 141; ca 9.2; hgb a1c 5.9; HIV: nr 03-10-20: wbc 11.2; hgb 11.3; hct 35.9; mcv 82.7 plt 188; glucose 160; bun 18; creat 1.21; k+ 3.3; na++ 139; ca 8.8 liver normal 3.0; mag 2.3 03-12-20: wbc 10.4; hgb 9.3; hct 29.6; mcv 84.3 plt 178; glucose 138; bun 34; creat 1.77; k+ 3.8; na++ 138; ca 8.3 03-14-20: glucose 134; bun 36; creat 1.23; k+ 3.2; na++ 139; ca 8.8 mag 2.1   03-16-20: hgb 10.7; hct 33.4 glucose 127; bun 18; creat 0.86 k+ 3.3; na++ 139; ca 9.2 ;liver normal albumin 2.5 iron 23; tibc 221; ferritin 137 03-23-20: k+ 3.0  03-27-20: glucose 133; bun 34; creat 0.85; k+ 3.6; na++ 142; ca 9.0; AM cortisol: 10.6  05-22-20: wbc 19.4; hgb 14.6; hct 43.8; mcv 81.6 plt 254; glucose 211; bun 35; creat 1.20; k+ 3.0; na++ 138; ca 9.8; liver normal albumin 3.5 mag 1.9; urine culture: <10,000; blood culture: no growth 05-25-20: wbc 14.2; hgb 20.8; hct 34.8; mcv 84.7 plt 212; glucose 109; bun 24; creat 0.86; k+ 3.4; na++ 137; ca 8.9; mag 1.9 05-28-20: wbc 10.9; hgb 11.8; hct 36.8; mcv 81.8 plt 227; glucose 132; bun 10; creat 0.82; k+ 3.0; na++ 137; ca 8.7 06-01-20: wbc 12.2; hgb 12.5; hct 40.3; mcv 81.9 plt 136; glucose 136; bun 12; creat 0.81; k+ 3.0; na++ 140; ca 8.9 mag 1.9 06-05-20: wbc 13.6; hgb 10.2; hct 32.9; mcv 93.5 plt 266 glucose 125; bun 35; creat 1.26; k+ 3.5; na++ 137; ca 8.9   TODAY  05-08-20:  wbc 11.9; hgb 9.6; hct 29.8; mcv 82.8 plt 318; glucose 114; bun 42; creat 1.47; k+ 3.9; na++ 138; ca 8.8  GFR 46; liver normal albumin 2.1    Review of Systems  Constitutional: Positive for malaise/fatigue.  Respiratory: Negative for cough and shortness of breath.   Cardiovascular: Negative for chest pain, palpitations and leg swelling.  Gastrointestinal: Positive for abdominal pain, diarrhea and nausea. Negative for heartburn.  Musculoskeletal: Negative for back pain, joint pain and myalgias.  Skin: Negative.   Neurological: Negative for dizziness.  Psychiatric/Behavioral: The patient is not nervous/anxious.     Physical Exam Constitutional:      General: He is not in acute distress.    Appearance: He is well-developed and well-nourished. He is not diaphoretic.  Neck:     Thyroid: No thyromegaly.  Cardiovascular:     Rate and Rhythm: Normal rate and regular rhythm.     Pulses: Normal pulses and intact distal pulses.     Heart sounds: Normal heart sounds.  Pulmonary:     Effort: Pulmonary effort is normal. No respiratory distress.     Breath sounds: Normal breath sounds.  Abdominal:     General: Bowel sounds are normal. There is no distension.     Palpations: Abdomen is soft.     Tenderness: There is abdominal tenderness.  Musculoskeletal:        General: No edema. Normal range of motion.     Cervical back: Neck supple.     Right lower leg: No edema.     Left lower leg: No edema.  Lymphadenopathy:     Cervical: No cervical adenopathy.  Skin:    General: Skin is warm and dry.  Neurological:     Mental Status: He is alert and oriented to person, place, and time.  Psychiatric:        Mood and Affect: Mood and affect and mood normal.      ASSESSMENT/ PLAN:  TODAY  1. Nausea vomiting diarrhea 2. Colitis 3. Acute renal failure  4. Protein calorie malnutrition  5. Iron deficiency anemia  His status is worse Will continue IVF Will continue daily BMP Will begin  prostat 60 cc three times daily with zofran 8 mg  Will continue to monitor his status.   MD is aware of resident's narcotic use and is in agreement with current plan of care. We will attempt to wean resident as appropriate.  Ok Edwards NP [facility]  Contact [phone] Monday through Friday 8am- 5pm  After hours call [phone]

## 2020-06-08 ENCOUNTER — Other Ambulatory Visit (HOSPITAL_COMMUNITY)
Admission: RE | Admit: 2020-06-08 | Discharge: 2020-06-08 | Disposition: A | Discharge status: Home / Self Care | Payer: Medicare Other | Source: Skilled Nursing Facility | Attending: Adult Health | Admitting: Adult Health

## 2020-06-08 DIAGNOSIS — R112 Nausea with vomiting, unspecified: Secondary | ICD-10-CM | POA: Insufficient documentation

## 2020-06-08 DIAGNOSIS — B370 Candidal stomatitis: Secondary | ICD-10-CM | POA: Insufficient documentation

## 2020-06-08 DIAGNOSIS — R197 Diarrhea, unspecified: Secondary | ICD-10-CM | POA: Insufficient documentation

## 2020-06-08 DIAGNOSIS — N179 Acute kidney failure, unspecified: Secondary | ICD-10-CM | POA: Insufficient documentation

## 2020-06-08 LAB — BASIC METABOLIC PANEL
Anion gap: 5 (ref 5–15)
BUN: 57 mg/dL — ABNORMAL HIGH (ref 8–23)
CO2: 20 mmol/L — ABNORMAL LOW (ref 22–32)
Calcium: 8.9 mg/dL (ref 8.9–10.3)
Chloride: 110 mmol/L (ref 98–111)
Creatinine, Ser: 1.81 mg/dL — ABNORMAL HIGH (ref 0.61–1.24)
GFR, Estimated: 36 mL/min — ABNORMAL LOW (ref >60)
Glucose, Bld: 107 mg/dL — ABNORMAL HIGH (ref 70–99)
Potassium: 4.3 mmol/L (ref 3.5–5.1)
Sodium: 135 mmol/L (ref 135–145)

## 2020-06-09 ENCOUNTER — Inpatient Hospital Stay (HOSPITAL_COMMUNITY): Payer: Medicare Other

## 2020-06-09 ENCOUNTER — Encounter (HOSPITAL_COMMUNITY): Payer: Self-pay

## 2020-06-09 ENCOUNTER — Other Ambulatory Visit: Payer: Self-pay

## 2020-06-09 ENCOUNTER — Inpatient Hospital Stay (HOSPITAL_COMMUNITY)
Admission: EM | Admit: 2020-06-09 | Discharge: 2020-06-14 | DRG: 393 | Disposition: A | Discharge status: Skilled Nursing Facility | Payer: Medicare Other | Source: Skilled Nursing Facility | Attending: Family Medicine | Admitting: Family Medicine

## 2020-06-09 ENCOUNTER — Emergency Department (HOSPITAL_COMMUNITY): Payer: Medicare Other

## 2020-06-09 ENCOUNTER — Other Ambulatory Visit (HOSPITAL_COMMUNITY)
Admission: RE | Admit: 2020-06-09 | Discharge: 2020-06-09 | Disposition: A | Discharge status: Home / Self Care | Payer: Medicare Other | Source: Skilled Nursing Facility | Attending: Adult Health | Admitting: Adult Health

## 2020-06-09 DIAGNOSIS — C61 Malignant neoplasm of prostate: Secondary | ICD-10-CM | POA: Diagnosis present

## 2020-06-09 DIAGNOSIS — Z79899 Other long term (current) drug therapy: Secondary | ICD-10-CM | POA: Diagnosis not present

## 2020-06-09 DIAGNOSIS — E875 Hyperkalemia: Secondary | ICD-10-CM | POA: Diagnosis present

## 2020-06-09 DIAGNOSIS — I10 Essential (primary) hypertension: Secondary | ICD-10-CM | POA: Diagnosis not present

## 2020-06-09 DIAGNOSIS — K529 Noninfective gastroenteritis and colitis, unspecified: Secondary | ICD-10-CM

## 2020-06-09 DIAGNOSIS — Z8546 Personal history of malignant neoplasm of prostate: Secondary | ICD-10-CM | POA: Diagnosis not present

## 2020-06-09 DIAGNOSIS — N179 Acute kidney failure, unspecified: Secondary | ICD-10-CM | POA: Diagnosis not present

## 2020-06-09 DIAGNOSIS — R933 Abnormal findings on diagnostic imaging of other parts of digestive tract: Secondary | ICD-10-CM | POA: Diagnosis not present

## 2020-06-09 DIAGNOSIS — R0902 Hypoxemia: Secondary | ICD-10-CM

## 2020-06-09 DIAGNOSIS — I739 Peripheral vascular disease, unspecified: Secondary | ICD-10-CM | POA: Diagnosis present

## 2020-06-09 DIAGNOSIS — Z9981 Dependence on supplemental oxygen: Secondary | ICD-10-CM | POA: Diagnosis not present

## 2020-06-09 DIAGNOSIS — I1A0 Resistant hypertension: Secondary | ICD-10-CM | POA: Diagnosis present

## 2020-06-09 DIAGNOSIS — S72001D Fracture of unspecified part of neck of right femur, subsequent encounter for closed fracture with routine healing: Secondary | ICD-10-CM | POA: Diagnosis not present

## 2020-06-09 DIAGNOSIS — F411 Generalized anxiety disorder: Secondary | ICD-10-CM | POA: Diagnosis present

## 2020-06-09 DIAGNOSIS — R262 Difficulty in walking, not elsewhere classified: Secondary | ICD-10-CM | POA: Diagnosis not present

## 2020-06-09 DIAGNOSIS — D508 Other iron deficiency anemias: Secondary | ICD-10-CM | POA: Diagnosis present

## 2020-06-09 DIAGNOSIS — Z20822 Contact with and (suspected) exposure to covid-19: Secondary | ICD-10-CM | POA: Diagnosis present

## 2020-06-09 DIAGNOSIS — M25552 Pain in left hip: Secondary | ICD-10-CM | POA: Diagnosis present

## 2020-06-09 DIAGNOSIS — K219 Gastro-esophageal reflux disease without esophagitis: Secondary | ICD-10-CM | POA: Diagnosis not present

## 2020-06-09 DIAGNOSIS — N281 Cyst of kidney, acquired: Secondary | ICD-10-CM | POA: Diagnosis not present

## 2020-06-09 DIAGNOSIS — Z66 Do not resuscitate: Secondary | ICD-10-CM | POA: Diagnosis present

## 2020-06-09 DIAGNOSIS — D62 Acute posthemorrhagic anemia: Secondary | ICD-10-CM | POA: Diagnosis not present

## 2020-06-09 DIAGNOSIS — K559 Vascular disorder of intestine, unspecified: Secondary | ICD-10-CM | POA: Diagnosis present

## 2020-06-09 DIAGNOSIS — R011 Cardiac murmur, unspecified: Secondary | ICD-10-CM | POA: Diagnosis not present

## 2020-06-09 DIAGNOSIS — E876 Hypokalemia: Secondary | ICD-10-CM | POA: Diagnosis not present

## 2020-06-09 DIAGNOSIS — R140 Abdominal distension (gaseous): Secondary | ICD-10-CM | POA: Diagnosis not present

## 2020-06-09 DIAGNOSIS — Z7982 Long term (current) use of aspirin: Secondary | ICD-10-CM | POA: Diagnosis not present

## 2020-06-09 DIAGNOSIS — J849 Interstitial pulmonary disease, unspecified: Secondary | ICD-10-CM | POA: Diagnosis not present

## 2020-06-09 DIAGNOSIS — E441 Mild protein-calorie malnutrition: Secondary | ICD-10-CM | POA: Diagnosis present

## 2020-06-09 DIAGNOSIS — K8020 Calculus of gallbladder without cholecystitis without obstruction: Secondary | ICD-10-CM

## 2020-06-09 DIAGNOSIS — G9341 Metabolic encephalopathy: Secondary | ICD-10-CM | POA: Diagnosis present

## 2020-06-09 DIAGNOSIS — G8929 Other chronic pain: Secondary | ICD-10-CM | POA: Diagnosis present

## 2020-06-09 DIAGNOSIS — J90 Pleural effusion, not elsewhere classified: Secondary | ICD-10-CM | POA: Diagnosis not present

## 2020-06-09 DIAGNOSIS — K5730 Diverticulosis of large intestine without perforation or abscess without bleeding: Secondary | ICD-10-CM | POA: Diagnosis not present

## 2020-06-09 DIAGNOSIS — K5909 Other constipation: Secondary | ICD-10-CM | POA: Diagnosis not present

## 2020-06-09 DIAGNOSIS — S72001A Fracture of unspecified part of neck of right femur, initial encounter for closed fracture: Secondary | ICD-10-CM | POA: Diagnosis not present

## 2020-06-09 DIAGNOSIS — K828 Other specified diseases of gallbladder: Secondary | ICD-10-CM | POA: Diagnosis not present

## 2020-06-09 DIAGNOSIS — E43 Unspecified severe protein-calorie malnutrition: Secondary | ICD-10-CM | POA: Diagnosis not present

## 2020-06-09 DIAGNOSIS — Z6827 Body mass index (BMI) 27.0-27.9, adult: Secondary | ICD-10-CM

## 2020-06-09 DIAGNOSIS — K922 Gastrointestinal hemorrhage, unspecified: Secondary | ICD-10-CM | POA: Diagnosis not present

## 2020-06-09 DIAGNOSIS — K8010 Calculus of gallbladder with chronic cholecystitis without obstruction: Secondary | ICD-10-CM | POA: Diagnosis present

## 2020-06-09 DIAGNOSIS — R079 Chest pain, unspecified: Secondary | ICD-10-CM | POA: Diagnosis not present

## 2020-06-09 DIAGNOSIS — N3281 Overactive bladder: Secondary | ICD-10-CM | POA: Diagnosis not present

## 2020-06-09 DIAGNOSIS — F0390 Unspecified dementia without behavioral disturbance: Secondary | ICD-10-CM | POA: Diagnosis not present

## 2020-06-09 DIAGNOSIS — Z993 Dependence on wheelchair: Secondary | ICD-10-CM | POA: Diagnosis not present

## 2020-06-09 DIAGNOSIS — G2581 Restless legs syndrome: Secondary | ICD-10-CM | POA: Diagnosis present

## 2020-06-09 DIAGNOSIS — I700 Atherosclerosis of aorta: Secondary | ICD-10-CM | POA: Diagnosis not present

## 2020-06-09 DIAGNOSIS — E7800 Pure hypercholesterolemia, unspecified: Secondary | ICD-10-CM | POA: Diagnosis present

## 2020-06-09 DIAGNOSIS — K648 Other hemorrhoids: Secondary | ICD-10-CM | POA: Diagnosis not present

## 2020-06-09 DIAGNOSIS — I509 Heart failure, unspecified: Secondary | ICD-10-CM | POA: Diagnosis not present

## 2020-06-09 DIAGNOSIS — K824 Cholesterolosis of gallbladder: Secondary | ICD-10-CM | POA: Diagnosis not present

## 2020-06-09 DIAGNOSIS — R2681 Unsteadiness on feet: Secondary | ICD-10-CM | POA: Diagnosis not present

## 2020-06-09 DIAGNOSIS — K6389 Other specified diseases of intestine: Secondary | ICD-10-CM | POA: Diagnosis not present

## 2020-06-09 DIAGNOSIS — R1011 Right upper quadrant pain: Secondary | ICD-10-CM

## 2020-06-09 DIAGNOSIS — E8770 Fluid overload, unspecified: Secondary | ICD-10-CM | POA: Diagnosis not present

## 2020-06-09 DIAGNOSIS — J9611 Chronic respiratory failure with hypoxia: Secondary | ICD-10-CM | POA: Diagnosis present

## 2020-06-09 DIAGNOSIS — M25551 Pain in right hip: Secondary | ICD-10-CM | POA: Diagnosis present

## 2020-06-09 DIAGNOSIS — N289 Disorder of kidney and ureter, unspecified: Secondary | ICD-10-CM | POA: Diagnosis not present

## 2020-06-09 DIAGNOSIS — M6281 Muscle weakness (generalized): Secondary | ICD-10-CM | POA: Diagnosis not present

## 2020-06-09 DIAGNOSIS — R197 Diarrhea, unspecified: Secondary | ICD-10-CM | POA: Diagnosis not present

## 2020-06-09 DIAGNOSIS — E785 Hyperlipidemia, unspecified: Secondary | ICD-10-CM | POA: Diagnosis present

## 2020-06-09 DIAGNOSIS — R279 Unspecified lack of coordination: Secondary | ICD-10-CM | POA: Diagnosis not present

## 2020-06-09 DIAGNOSIS — R0600 Dyspnea, unspecified: Secondary | ICD-10-CM | POA: Diagnosis not present

## 2020-06-09 DIAGNOSIS — R0602 Shortness of breath: Secondary | ICD-10-CM | POA: Diagnosis not present

## 2020-06-09 LAB — COMPREHENSIVE METABOLIC PANEL
ALT: 15 U/L (ref 0–44)
AST: 11 U/L — ABNORMAL LOW (ref 15–41)
Albumin: 2.3 g/dL — ABNORMAL LOW (ref 3.5–5.0)
Alkaline Phosphatase: 82 U/L (ref 38–126)
Anion gap: 5 (ref 5–15)
BUN: 52 mg/dL — ABNORMAL HIGH (ref 8–23)
CO2: 20 mmol/L — ABNORMAL LOW (ref 22–32)
Calcium: 9.3 mg/dL (ref 8.9–10.3)
Chloride: 111 mmol/L (ref 98–111)
Creatinine, Ser: 1.64 mg/dL — ABNORMAL HIGH (ref 0.61–1.24)
GFR, Estimated: 40 mL/min — ABNORMAL LOW (ref >60)
Glucose, Bld: 107 mg/dL — ABNORMAL HIGH (ref 70–99)
Potassium: 5.3 mmol/L — ABNORMAL HIGH (ref 3.5–5.1)
Sodium: 136 mmol/L (ref 135–145)
Total Bilirubin: 0.4 mg/dL (ref 0.3–1.2)
Total Protein: 5.4 g/dL — ABNORMAL LOW (ref 6.5–8.1)

## 2020-06-09 LAB — RESP PANEL BY RT-PCR (FLU A&B, COVID) ARPGX2
Influenza A by PCR: NEGATIVE
Influenza B by PCR: NEGATIVE
SARS Coronavirus 2 by RT PCR: NEGATIVE

## 2020-06-09 LAB — CBC WITH DIFFERENTIAL/PLATELET
Abs Immature Granulocytes: 0.13 10*3/uL — ABNORMAL HIGH (ref 0.00–0.07)
Basophils Absolute: 0 10*3/uL (ref 0.0–0.1)
Basophils Relative: 0 %
Eosinophils Absolute: 0 10*3/uL (ref 0.0–0.5)
Eosinophils Relative: 0 %
HCT: 33.5 % — ABNORMAL LOW (ref 39.0–52.0)
Hemoglobin: 10.2 g/dL — ABNORMAL LOW (ref 13.0–17.0)
Immature Granulocytes: 1 %
Lymphocytes Relative: 8 %
Lymphs Abs: 0.8 10*3/uL (ref 0.7–4.0)
MCH: 25.5 pg — ABNORMAL LOW (ref 26.0–34.0)
MCHC: 30.4 g/dL (ref 30.0–36.0)
MCV: 83.8 fL (ref 80.0–100.0)
Monocytes Absolute: 0.8 10*3/uL (ref 0.1–1.0)
Monocytes Relative: 8 %
Neutro Abs: 7.8 10*3/uL — ABNORMAL HIGH (ref 1.7–7.7)
Neutrophils Relative %: 83 %
Platelets: 413 10*3/uL — ABNORMAL HIGH (ref 150–400)
RBC: 4 MIL/uL — ABNORMAL LOW (ref 4.22–5.81)
RDW: 15.9 % — ABNORMAL HIGH (ref 11.5–15.5)
WBC: 9.6 10*3/uL (ref 4.0–10.5)
nRBC: 0 % (ref 0.0–0.2)

## 2020-06-09 LAB — BASIC METABOLIC PANEL
Anion gap: 4 — ABNORMAL LOW (ref 5–15)
BUN: 53 mg/dL — ABNORMAL HIGH (ref 8–23)
CO2: 19 mmol/L — ABNORMAL LOW (ref 22–32)
Calcium: 8.9 mg/dL (ref 8.9–10.3)
Chloride: 110 mmol/L (ref 98–111)
Creatinine, Ser: 1.59 mg/dL — ABNORMAL HIGH (ref 0.61–1.24)
GFR, Estimated: 41 mL/min — ABNORMAL LOW (ref >60)
Glucose, Bld: 105 mg/dL — ABNORMAL HIGH (ref 70–99)
Potassium: 4.7 mmol/L (ref 3.5–5.1)
Sodium: 133 mmol/L — ABNORMAL LOW (ref 135–145)

## 2020-06-09 LAB — PROTIME-INR
INR: 1.3 — ABNORMAL HIGH (ref 0.8–1.2)
Prothrombin Time: 15.2 seconds (ref 11.4–15.2)

## 2020-06-09 LAB — LACTIC ACID, PLASMA: Lactic Acid, Venous: 0.9 mmol/L (ref 0.5–1.9)

## 2020-06-09 LAB — LIPASE, BLOOD: Lipase: 27 U/L (ref 11–51)

## 2020-06-09 MED ORDER — ASPIRIN 81 MG PO CHEW
81.0000 mg | CHEWABLE_TABLET | Freq: Every day | ORAL | Status: DC
  Administered 2020-06-10 – 2020-06-14 (×5): 81 mg via ORAL
  Filled 2020-06-09 (×5): qty 1

## 2020-06-09 MED ORDER — VENLAFAXINE HCL ER 75 MG PO CP24
150.0000 mg | ORAL_CAPSULE | Freq: Every day | ORAL | Status: DC
  Administered 2020-06-10 – 2020-06-14 (×5): 150 mg via ORAL
  Filled 2020-06-09 (×5): qty 2

## 2020-06-09 MED ORDER — ALFUZOSIN HCL ER 10 MG PO TB24
10.0000 mg | ORAL_TABLET | Freq: Every day | ORAL | Status: DC
  Administered 2020-06-10 – 2020-06-14 (×4): 10 mg via ORAL
  Filled 2020-06-09 (×7): qty 1

## 2020-06-09 MED ORDER — ONDANSETRON HCL 4 MG PO TABS: 4.0000 mg | ORAL_TABLET | Freq: Four times a day (QID) | ORAL | Status: DC | PRN

## 2020-06-09 MED ORDER — ENOXAPARIN SODIUM 40 MG/0.4ML ~~LOC~~ SOLN
40.0000 mg | SUBCUTANEOUS | Status: DC
  Administered 2020-06-09 – 2020-06-13 (×5): 40 mg via SUBCUTANEOUS
  Filled 2020-06-09 (×5): qty 0.4

## 2020-06-09 MED ORDER — MORPHINE SULFATE (PF) 4 MG/ML IV SOLN
4.0000 mg | Freq: Once | INTRAVENOUS | Status: AC
  Administered 2020-06-09: 4 mg via INTRAVENOUS
  Filled 2020-06-09: qty 1

## 2020-06-09 MED ORDER — SODIUM CHLORIDE 0.9 % IV BOLUS
1000.0000 mL | Freq: Once | INTRAVENOUS | Status: AC
  Administered 2020-06-09: 1000 mL via INTRAVENOUS

## 2020-06-09 MED ORDER — DEXTROSE IN LACTATED RINGERS 5 % IV SOLN: INTRAVENOUS | Status: DC

## 2020-06-09 MED ORDER — ONDANSETRON HCL 4 MG/2ML IJ SOLN: 4.0000 mg | Freq: Four times a day (QID) | INTRAMUSCULAR | Status: DC | PRN

## 2020-06-09 MED ORDER — LOPERAMIDE HCL 2 MG PO CAPS: 2.0000 mg | ORAL_CAPSULE | ORAL | Status: DC | PRN

## 2020-06-09 MED ORDER — DILTIAZEM HCL ER COATED BEADS 180 MG PO CP24
360.0000 mg | ORAL_CAPSULE | Freq: Every day | ORAL | Status: DC
  Administered 2020-06-10 – 2020-06-14 (×5): 360 mg via ORAL
  Filled 2020-06-09: qty 1
  Filled 2020-06-09 (×2): qty 2
  Filled 2020-06-09: qty 1
  Filled 2020-06-09 (×2): qty 2
  Filled 2020-06-09: qty 1
  Filled 2020-06-09: qty 2

## 2020-06-09 MED ORDER — IOHEXOL 300 MG/ML  SOLN
75.0000 mL | Freq: Once | INTRAMUSCULAR | Status: AC | PRN
  Administered 2020-06-09: 75 mL via INTRAVENOUS

## 2020-06-09 MED ORDER — ROPINIROLE HCL 0.25 MG PO TABS
0.2500 mg | ORAL_TABLET | Freq: Every day | ORAL | Status: DC
  Administered 2020-06-09 – 2020-06-13 (×5): 0.25 mg via ORAL
  Filled 2020-06-09 (×7): qty 1

## 2020-06-09 MED ORDER — ACETAMINOPHEN 500 MG PO TABS
1000.0000 mg | ORAL_TABLET | Freq: Three times a day (TID) | ORAL | Status: DC
  Administered 2020-06-09 – 2020-06-14 (×14): 1000 mg via ORAL
  Filled 2020-06-09 (×15): qty 2

## 2020-06-09 MED ORDER — METOPROLOL TARTRATE 25 MG PO TABS
25.0000 mg | ORAL_TABLET | Freq: Two times a day (BID) | ORAL | Status: DC
  Administered 2020-06-09 – 2020-06-14 (×10): 25 mg via ORAL
  Filled 2020-06-09 (×10): qty 1

## 2020-06-09 MED ORDER — SODIUM CHLORIDE 0.9 % IV SOLN: Freq: Once | INTRAVENOUS | Status: AC

## 2020-06-09 MED ORDER — HYDROMORPHONE HCL 1 MG/ML IJ SOLN
0.5000 mg | INTRAMUSCULAR | Status: DC | PRN
  Administered 2020-06-12: 0.5 mg via INTRAVENOUS
  Filled 2020-06-09: qty 0.5

## 2020-06-09 MED ORDER — PANTOPRAZOLE SODIUM 40 MG PO TBEC
40.0000 mg | DELAYED_RELEASE_TABLET | Freq: Every day | ORAL | Status: DC
  Administered 2020-06-10 – 2020-06-12 (×3): 40 mg via ORAL
  Filled 2020-06-09 (×3): qty 1

## 2020-06-09 MED ORDER — MELATONIN 3 MG PO TABS
9.0000 mg | ORAL_TABLET | Freq: Every day | ORAL | Status: DC
  Administered 2020-06-09 – 2020-06-13 (×5): 9 mg via ORAL
  Filled 2020-06-09 (×5): qty 3

## 2020-06-09 MED ORDER — MEMANTINE HCL 10 MG PO TABS
10.0000 mg | ORAL_TABLET | Freq: Two times a day (BID) | ORAL | Status: DC
  Administered 2020-06-09 – 2020-06-14 (×10): 10 mg via ORAL
  Filled 2020-06-09 (×10): qty 1

## 2020-06-09 NOTE — ED Triage Notes (Signed)
Pt presents to ED via wheelchair from [facility] for abdominal distention x 2 days, nausea. Per NF, have been giving zofran with meals last had at 0800.

## 2020-06-09 NOTE — H&P (Signed)
History and Physical  Patient Name: Jeffrey Sherman     [id number]    DOB: Jul 05, 1929    DOA: 06/09/2020 PCP: Gerlene Fee, NP  Patient coming from: [facility]  Chief Complaint: Abdominal pain, diarrhea, distension, vomiting      HPI: Jeffrey Sherman is a 86 y.o. M with hx ILD on home O2, moderate dementia, wheelchair bound last 6 months, lives at [facility], prostate cancer remote, HTN and PVD as well as recent admission for colitis, presumed ischemic versus infectious who presents with progressive abdominal pain, nausea, diarrhea and abdominal distension.  Patient was admitted with identical symptoms plus hematochezia earlier this month for 10 days. CT showed colitis, started on antibiotics.  GI consulted. Management was supportive.  Cdiff and GI PCR panel negative.  Imprved.  CTA abdomen showed tandem stenosis of IMA but patent celiac and SMA.  WBC, hematochezia, pain and diarrhea had some improvement and patient was discharged.  Since discharge 7 days ago, he was able to tolerate PO intake but still had nausea, abdominal pain.  This progressively got worse and so he was started on IV fluids, but had progressively worse abdominal pain, all over, frequent loose stools and nausea and inability to eat.  Creatinine started to worsen, and so patient was sent to hospital for GI and Gen Surg consultation.  In the ER, CT abdomen showed persistent left sided colitis but also GB distension and haziness.  Creatinine 1.6.  Afebrile, and without leukocytosis.         ROS: Review of Systems  Constitutional: Positive for malaise/fatigue. Negative for fever.  Respiratory: Negative for cough and shortness of breath.   Cardiovascular: Negative for chest pain and leg swelling.  Gastrointestinal: Positive for abdominal pain, blood in stool, diarrhea, nausea and vomiting. Negative for constipation and melena.  Neurological: Positive for weakness. Negative for focal weakness.  All other  systems reviewed and are negative.         Past Medical History:  Diagnosis Date  . Anxiety   . Cancer [facility])    prostate  . Chronic chest wall pain   . Essential hypertension, benign 06/20/2016  . GAD (generalized anxiety disorder)   . GERD (gastroesophageal reflux disease)   . H/O echocardiogram 2016   normal  . Heart murmur    "leaking heart valve"  . High cholesterol 06/20/2016  . HOH (hard of hearing)    left  . Hx of falling   . Hyperlipidemia   . Interstitial pulmonary disease ([place])   . MDD (major depressive disorder)   . Murmur, cardiac   . Poor historian   . PUD (peptic ulcer disease)   . PUD (peptic ulcer disease)   . RLS (restless legs syndrome)   . Shortness of breath   . Wrist fracture 12/16/2011    Past Surgical History:  Procedure Laterality Date  . COLONOSCOPY  2005   Rehman: Sigmoid colon diverticulosis, moderate sized external hemorrhoids. A few focal areas of mucosal erythema felt to be nonspecific.  Marland Kitchen ESOPHAGOGASTRODUODENOSCOPY    . ESOPHAGOGASTRODUODENOSCOPY N/A 07/18/2016   Rehman: normal exam except duodenal scar. h.pylori serologies negative  . HIP PINNING,CANNULATED Right 03/10/2020   Procedure: INTERNAL FIXATION RIGHT HIP;  Surgeon: Carole Civil, MD;  Location: [facility];  Service: Orthopedics;  Laterality: Right;  . ORIF WRIST FRACTURE  12/19/2011   Procedure: OPEN REDUCTION INTERNAL FIXATION (ORIF) WRIST FRACTURE;  Surgeon: Carole Civil, MD;  Location: [facility];  Service: Orthopedics;  Laterality: Right;  . prostate cancer     Diagnosed in the 90s  . TONSILLECTOMY      Social History: Patient lives at [facility]  The patient is wheelchair bound.  Nonsmoker.  No Known Allergies  Family history: family history includes Cancer in his brother and another family member; Heart attack in his brother and mother; Heart disease in his sister.  Prior to Admission medications   Medication Sig Start Date End Date Taking? Authorizing Provider   acetaminophen (TYLENOL) 500 MG tablet Take 1,000 mg by mouth in the morning, at noon, and at bedtime. 03/20/20  Yes [provider]  alfuzosin (UROXATRAL) 10 MG 24 hr tablet Take 10 mg by mouth daily with breakfast.   Yes [provider]  Amino Acids-Protein Hydrolys (PRO-STAT PO) liquid; 15-100 gram-kcal/30 mL; amt: 30 ml; oral Special Instructions: for albumin 2.5 With Meals 03/16/20  Yes [provider]  Ascorbic Acid (VITAMIN C) 1000 MG tablet Take 1,000 mg by mouth daily.   Yes [provider]  aspirin 81 MG chewable tablet Chew 81 mg by mouth daily. 04/15/20  Yes [provider]  Janne Lab Oil [facility]) OINT Special Instructions: Apply to bilateral buttocks & sacral area qshift for blanchable erythema. 03/15/20  Yes [provider]  calcium carbonate (TUMS - DOSED IN MG ELEMENTAL CALCIUM) 500 MG chewable tablet Chew 1 tablet by mouth 2 (two) times daily as needed for indigestion or heartburn.    Yes [provider]  diltiazem (CARDIZEM CD) 360 MG 24 hr capsule Take 360 mg by mouth daily. Start 03/23/2020   Yes [provider]  ferrous sulfate 325 (65 FE) MG tablet Take 325 mg by mouth daily with breakfast. For Anemia and  Iron Deficiency 03/17/20  Yes [provider]  Flax Oil-Fish Oil-Borage Oil (FISH OIL-FLAX OIL-BORAGE OIL) CAPS Take 1 capsule by mouth daily.   Yes [provider]  hydrALAZINE (APRESOLINE) 100 MG tablet Take 100 mg by mouth 3 (three) times daily.   Yes [provider]  lisinopril (ZESTRIL) 40 MG tablet Take 1 tablet (40 mg total) by mouth daily. 03/14/20  Yes Johnson, Clanford L, MD  loperamide (IMODIUM A-D) 2 MG tablet amt: 2mg ; oral Special Instructions: after each stool do not exceed more than 4tabs in 24 hours, As Needed 06/01/20  Yes [provider]  Melatonin 10 MG TABS Take 10 mg by mouth at bedtime.   Yes [provider]  memantine (NAMENDA) 10  MG tablet Take 10 mg by mouth 2 (two) times daily.   Yes [provider]  metoprolol tartrate (LOPRESSOR) 25 MG tablet Take 1 tablet (25 mg total) by mouth 2 (two) times daily. 06/01/20  Yes Johnson, Clanford L, MD  nystatin (MYCOSTATIN) 100000 UNIT/ML suspension Take 5 mLs by mouth 4 (four) times daily. For Oral Thrush 06/06/20 06/13/20 Yes [provider]  omeprazole (PRILOSEC) 20 MG capsule Take 1 capsule (20 mg total) by mouth daily before breakfast. 03/14/20  Yes Johnson, Clanford L, MD  ondansetron (ZOFRAN-ODT) 8 MG disintegrating tablet Take 8 mg by mouth 3 (three) times daily before meals. For vomiting and nausea 06/07/20  Yes [provider]  polyethylene glycol (MIRALAX / GLYCOLAX) 17 g packet Take 17 g by mouth daily as needed. 06/01/20  Yes [provider]  potassium chloride SA (KLOR-CON) 20 MEQ tablet Take 20 mEq by mouth 2 (two) times daily.  03/23/20  Yes [provider]  rOPINIRole (REQUIP) 0.25 MG tablet  Take 0.25 mg by mouth at bedtime. 04/21/20  Yes [provider]  salicylic acid 17 % gel Apply 1 application topically in the morning and at bedtime. Apply to warts to left hand   Yes [provider]  senna-docusate (SENOKOT-S) 8.6-50 MG tablet Take 1 tablet by mouth at bedtime as needed for mild constipation. 03/14/20  Yes Johnson, Clanford L, MD  terazosin (HYTRIN) 5 MG capsule Take 5 mg by mouth at bedtime.  03/14/20  Yes [provider]  venlafaxine XR (EFFEXOR-XR) 150 MG 24 hr capsule Take 150 mg by mouth daily with breakfast. 06/01/20  Yes [provider]  cloNIDine (CATAPRES) 0.1 MG tablet Take 0.1 mg by mouth once.    [provider]  ipratropium-albuterol (DUONEB) 0.5-2.5 (3) MG/3ML SOLN Take 3 mLs by nebulization every 6 (six) hours as needed.    [provider]  NON FORMULARY Diet - Mechanical Soft 03/14/20   [provider]  OXYGEN Inhale 2 L into the lungs continuous.  03/14/20   [provider]       Physical Exam: BP 128/61   Pulse 79   Temp 98 F (36.7 C) (Oral)   Resp (!) 31   Ht 5\' 9"  (1.753 m)   Wt 84.4 kg   SpO2 92%   BMI 27.47 kg/m  General appearance: Elderly adult male, awake, lying in bed, appears uncomfortable, but interactive and appropriate.   Eyes: Anicteric, conjunctiva pink, lids and lashes normal. PERRL.    ENT: No nasal deformity, discharge, epistaxis.  Hearing slightly diminshed. OP dry without lesions.  Lips normal, edentulous. Neck: No neck masses.  Trachea midline.  No thyromegaly/tenderness. Lymph: No cervical or supraclavicular lymphadenopathy. Skin: Warm and dry.  No jaundice.  No suspicious rashes or lesions. Cardiac: RRR, nl S1-S2, no murmurs appreciated.  Capillary refill is brisk.  JVP normal.  1+ LE edema.  Radial pulses 2+ and symmetric. Respiratory: Tachypneic.  CTAB without rales or wheezes. Abdomen: Abdomen soft.  Moderat TTP worse in RUQ where it is severe.  Soft.  Distended.  Tympanitic. No ascites. MSK: No deformities or effusions of the large joints of the upper or lower extremities bilaterally.  No cyanosis or clubbing. Neuro: Cranial nerves normal.  Sensation intact to light touch. Speech is fluent.  Muscle strength 4/5 and symmetric.    Psych: Sensorium intact and responding to questions, attention normal.  Behavior appropriate.  Affect stable.  Judgment and insight appear slightly impaired.     Labs on Admission:  I have personally reviewed following labs and imaging studies: CBC: Recent Labs  Lab 06/05/20 0710 06/07/20 0627 06/09/20 1117  WBC 13.6* 11.9* 9.6  NEUTROABS  --  8.9* 7.8*  HGB 10.2* 9.6* 10.2*  HCT 32.9* 29.8* 33.5*  MCV 83.5 82.8 83.8  PLT 266 318 [id number]*   Basic Metabolic Panel: Recent Labs  Lab 06/05/20 0710 06/07/20 0627 06/08/20 0330 06/09/20 0650 06/09/20 1117  NA 137 137 135 133* 136  K 3.5 3.9 4.3 4.7 5.3*  CL 108 109 110 110 111  CO2 21* 20* 20* 19* 20*   GLUCOSE 125* 114* 107* 105* 107*  BUN 35* 42* 57* 53* 52*  CREATININE 1.26* 1.47* 1.81* 1.59* 1.64*  CALCIUM 8.9 8.8* 8.9 8.9 9.3   GFR: Estimated Creatinine Clearance: 31.1 mL/min (A) (by C-G formula based on SCr of 1.64 mg/dL (H)).  Liver Function Tests: Recent Labs  Lab 06/07/20 0627 06/09/20 1117  AST 9* 11*  ALT 11 15  ALKPHOS 68 82  BILITOT 0.2* 0.4  PROT 4.9* 5.4*  ALBUMIN 2.1* 2.3*   Recent Labs  Lab 06/09/20 1117  LIPASE 27   No results for input(s): AMMONIA in the last 168 hours. Coagulation Profile: Recent Labs  Lab 06/09/20 1117  INR 1.3*   Lactate normal  Recent Results (from the past 240 hour(s))  SARS Coronavirus 2 by RT PCR (hospital order, performed in [facility] hospital lab) Nasopharyngeal Nasopharyngeal Swab     Status: None   Collection Time: 06/01/20  9:08 AM   Specimen: Nasopharyngeal Swab  Result Value Ref Range Status   SARS Coronavirus 2 NEGATIVE NEGATIVE Final    Comment: Performed at [facility], [address] [place], [place] [address]  Resp Panel by RT-PCR (Flu A&B, Covid) Nasopharyngeal Swab     Status: None   Collection Time: 06/09/20 12:25 PM   Specimen: Nasopharyngeal Swab; Nasopharyngeal(NP) swabs in vial transport medium  Result Value Ref Range Status   SARS Coronavirus 2 by RT PCR NEGATIVE NEGATIVE Final    Comment: (NOTE) SARS-CoV-2 target nucleic acids are NOT DETECTED.  The SARS-CoV-2 RNA is generally detectable in upper respiratory specimens during the acute phase of infection. The lowest concentration of SARS-CoV-2 viral copies this assay can detect is 138 copies/mL. A negative result does not preclude SARS-Cov-2 infection and should not be used as the sole basis for treatment or other patient management decisions. A negative result may occur with  improper specimen collection/handling, submission of specimen other than nasopharyngeal swab, presence of viral mutation(s) within the areas targeted by this assay, and  inadequate number of viral copies(<138 copies/mL). A negative result must be combined with clinical observations, patient history, and epidemiological information. The expected result is Negative.  Fact Sheet for Patients:  EntrepreneurPulse.com.au  Fact Sheet for Healthcare Providers:  IncredibleEmployment.be  This test is no t yet approved or cleared by the [place] FDA and  has been authorized for detection and/or diagnosis of SARS-CoV-2 by FDA under an Emergency Use Authorization (EUA). This EUA will remain  in effect (meaning this test can be used) for the duration of the COVID-19 declaration under Section 564(b)(1) of the Act, 21 U.S.C.section 360bbb-3(b)(1), unless the authorization is terminated  or revoked sooner.       Influenza A by PCR NEGATIVE NEGATIVE Final   Influenza B by PCR NEGATIVE NEGATIVE Final    Comment: (NOTE) The Xpert Xpress SARS-CoV-2/FLU/RSV plus assay is intended as an aid in the diagnosis of influenza from Nasopharyngeal swab specimens and should not be used as a sole basis for treatment. Nasal washings and aspirates are unacceptable for Xpert Xpress SARS-CoV-2/FLU/RSV testing.  Fact Sheet for Patients: EntrepreneurPulse.com.au  Fact Sheet for Healthcare Providers: IncredibleEmployment.be  This test is not yet approved or cleared by the [place] FDA and has been authorized for detection and/or diagnosis of SARS-CoV-2 by FDA under an Emergency Use Authorization (EUA). This EUA will remain in effect (meaning this test can be used) for the duration of the COVID-19 declaration under Section 564(b)(1) of the Act, 21 U.S.C. section 360bbb-3(b)(1), unless the authorization is terminated or revoked.  Performed at [facility], [address] [place], [place] [address]            Radiological Exams on Admission: Personally reviewed CXR shows no opacity; CT abdomen  report reviewed, cholecystitis and colitis suggested: CT Abdomen Pelvis W Contrast  Result Date: 06/09/2020 CLINICAL DATA:  Abdominal distension EXAM: CT ABDOMEN AND PELVIS WITH CONTRAST  TECHNIQUE: Multidetector CT imaging of the abdomen and pelvis was performed using the standard protocol following bolus administration of intravenous contrast. CONTRAST:  24mL OMNIPAQUE IOHEXOL 300 MG/ML  SOLN COMPARISON:  06/05/2020 FINDINGS: Lower chest: Small bilateral pleural effusions. Bibasilar atelectasis. Diffuse coronary artery calcifications. Heart is normal size. Hepatobiliary: Gallbladder is distended. Layering stones and possible sludge within the gallbladder. Slight indistinctness noted around the gallbladder concerning for cholecystitis. Mild periportal edema. No biliary ductal dilatation. Small cyst in the right hepatic lobe 16 mm. Pancreas: No focal abnormality or ductal dilatation. Spleen: No focal abnormality.  Normal size. Adrenals/Urinary Tract: Bilateral renal cysts. Slight fullness of the left renal collecting system, stable since prior study. No stones or overt hydronephrosis. Urinary bladder unremarkable. Stomach/Bowel: Sigmoid and descending colonic diverticulosis. There is wall thickening noted from the distal transverse colon through the mid to distal descending colon concerning for colitis. Slight surrounding inflammation. Remainder of the colon, stomach and small bowel unremarkable. Vascular/Lymphatic: Heavily calcified aorta and iliac vessels. No aneurysm or adenopathy. Reproductive: Mildly prominent prostate. Other: No free fluid or free air. Musculoskeletal: No acute bony abnormality. IMPRESSION: Cholelithiasis. Haziness noted around the gallbladder. Cannot exclude acute cholecystitis. Continued wall thickening in the left colon from the distal transverse colon through the distal descending colon concerning for colitis. Left colonic diverticulosis. Small bilateral pleural effusions, increasing  since prior study. Bibasilar atelectasis. Coronary artery disease, aortic atherosclerosis. Electronically Signed   By: Rolm Baptise M.D.   On: 06/09/2020 13:39   DG Abd Acute W/Chest  Result Date: 06/09/2020 CLINICAL DATA:  Abdominal distension. EXAM: DG ABDOMEN ACUTE WITH 1 VIEW CHEST COMPARISON:  None. FINDINGS: No small bowel dilatation is noted. Dilated and air-filled colon is noted suggesting possible ileus. No radiopaque calculi or other significant radiographic abnormality is seen. Heart size and mediastinal contours are within normal limits. Both lungs are clear. IMPRESSION: Dilated and air-filled colon is noted suggesting possible ileus. No acute cardiopulmonary disease. Electronically Signed   By: Marijo Conception M.D.   On: 06/09/2020 12:12    EKG: Independently reviewed. Rate 75, QTc normal, no ST changes. NSR.       Assessment/Plan   Colitis Negative infectious work up last time and persistence despite adequate antibiotic treatment rule out infectious colitis.    Ischemic colitis seems more likely, not acute given normal CTA last time.  Other considerations include IBD, seems less likely.  -Bowel rest -IV fluids -IV hydromorphone as needed -IV ondansetron as needed  -Continue aspirin  -Consult gastroenterology, general surgery, appreciate cares    Right upper quadrant pain LFTs normal but CT suggests cholecystitis and pain on RUQ exam. -Obtain RUQ [place] -Consult General Surgery  Acute kidney injury Hyperkalemia -Hold lisinopril -Start IV fluids -Trend BMP  ILD Chronic hypoxic respiratory failure Not on disease specific therapy.   -Continue home O2  Hypertension BP soft here -Continue aspirin, diltiazem, metoprolol -Hold hydralazine, lisinopril  Dementia -Continue venlafaxine -Dementia precautions  Restless leg syndrome  -Continue Requip  History prostate cancer  -Continue alfuzosin  Hx PUD -Continue PPI         DVT prophylaxis:  Lovenox Code Status: DNR Family Communication: Son by phone, daughter-in-law at the bedside Disposition Plan: Anticipate bowel rest, supportive cares, general surgery and gastroenterology consultations, right upper quadrant ultrasound and follow-up response to conservative measures. Consults called: General surgery, Dr. Constance Haw.  GI, Dr. Wallis Mart. Admission status: Inpatient   At the time of admission, it appears that the appropriate admission status for this patient  is INPATIENT. This is judged to be reasonable and necessary in order to provide the required intensity of service to ensure the patient's safety given: -presenting symptoms of abdominal pain, diarrhea -physical exam findings of abdominal pain, and  -initial radiographic and laboratory data cholecystitis, colitis, kidney injury -in the context of their chronic comorbidities ILD, chronic respiratory failure, dementia    Together, these circumstances are felt to place him at high risk for further clinical deterioration threatening life, limb, or organ requiring a high intensity of service due to this acute illness that poses a threat to life, limb or bodily function.  I certify that at the point of admission it is my clinical judgment that the patient will require inpatient hospital care spanning beyond 2 midnights from the point of admission and that early discharge would result in unnecessary risk of decompensation and readmission or threat to life, limb or bodily function.   Medical decision making: Patient seen at 3:21 PM on 06/09/2020.  The patient was discussed with Dr. Charm Barges.  What exists of the patient's chart was reviewed in depth and summarized above.  Clinical condition: stable on home O2.        Earl Lites Torre [place] [facility] Please page though AMION or Epic secure chat:  For password, contact charge nurse

## 2020-06-09 NOTE — ED Provider Notes (Signed)
[facility] EMERGENCY DEPARTMENT Provider Note   CSN: [id number] Arrival date & time: 06/09/20  1102     History Chief Complaint  Patient presents with  . Abdominal Pain    Jeffrey Sherman is a 86 y.o. male.  Level 5 caveat secondary to dementia.  Patient is a resident at [facility].  He was brought over here for abdominal distention and renal failure x2 days.  Patient states he is not sure why he is here.  He agrees that he is more distended and has a little bit of pain.  Has been having watery stools.  Follows with Dr. Laural Golden from GI.  Denies any fever.  No chest pain or shortness of breath.  The history is provided by the patient.  Abdominal Pain Pain location:  Generalized Pain quality: pressure   Pain radiates to:  Does not radiate Pain severity:  Moderate Onset quality:  Gradual Timing:  Intermittent Progression:  Unchanged Chronicity:  New Context: not trauma   Relieved by:  None tried Worsened by:  Nothing Ineffective treatments:  None tried Associated symptoms: diarrhea, flatus and nausea   Associated symptoms: no chest pain, no cough, no dysuria, no fever, no shortness of breath and no vomiting        Past Medical History:  Diagnosis Date  . Anxiety   . Cancer [facility])    prostate  . Chronic chest wall pain   . Essential hypertension, benign 06/20/2016  . GAD (generalized anxiety disorder)   . GERD (gastroesophageal reflux disease)   . H/O echocardiogram 2016   normal  . Heart murmur    "leaking heart valve"  . High cholesterol 06/20/2016  . HOH (hard of hearing)    left  . Hx of falling   . Hyperlipidemia   . Interstitial pulmonary disease ([place])   . MDD (major depressive disorder)   . Murmur, cardiac   . Poor historian   . PUD (peptic ulcer disease)   . PUD (peptic ulcer disease)   . RLS (restless legs syndrome)   . Shortness of breath   . Wrist fracture 12/16/2011    Patient Active Problem List   Diagnosis Date Noted  . Nausea vomiting and  diarrhea 06/08/2020  . Oral thrush 06/08/2020  . Nausea & vomiting 06/08/2020  . Acute renal failure (ARF) ([place]) 06/08/2020  . Right hip pain   . Lower GI bleed   . Ischemic colitis ([place]) 05/24/2020  . Sepsis ([place]) 05/22/2020  . Restless leg syndrome 04/21/2020  . Protein-calorie malnutrition, severe ([place]) 03/22/2020  . Dementia without behavioral disturbance ([place]) 03/22/2020  . Iron deficiency anemia due to dietary causes 03/22/2020  . Prostate cancer ([place]) 03/15/2020  . Overactive bladder 03/15/2020  . Dyslipidemia 03/15/2020  . Major depression, recurrent, chronic ([place]) 03/15/2020  . Interstitial lung disease ([place]) 03/15/2020  . Closed displaced fracture of right femoral neck ([place]) 03/10/20 cannulated screw fixation  03/09/2020  . Colitis   . Acute blood loss as cause of postoperative anemia   . Rectal bleeding 11/06/2017  . Hypokalemia 11/06/2017  . Renal insufficiency 11/06/2017  . Essential hypertension 06/20/2016  . High cholesterol 06/20/2016  . Gastroesophageal reflux disease without esophagitis 06/20/2016  . Dyspnea 12/17/2013  . Chronic constipation 12/16/2011    Past Surgical History:  Procedure Laterality Date  . COLONOSCOPY  2005   Rehman: Sigmoid colon diverticulosis, moderate sized external hemorrhoids. A few focal areas of mucosal erythema felt to be nonspecific.  Marland Kitchen ESOPHAGOGASTRODUODENOSCOPY    .  ESOPHAGOGASTRODUODENOSCOPY N/A 07/18/2016   Rehman: normal exam except duodenal scar. h.pylori serologies negative  . HIP PINNING,CANNULATED Right 03/10/2020   Procedure: INTERNAL FIXATION RIGHT HIP;  Surgeon: Carole Civil, MD;  Location: [facility];  Service: Orthopedics;  Laterality: Right;  . ORIF WRIST FRACTURE  12/19/2011   Procedure: OPEN REDUCTION INTERNAL FIXATION (ORIF) WRIST FRACTURE;  Surgeon: Carole Civil, MD;  Location: [facility];  Service: Orthopedics;  Laterality: Right;  . prostate cancer     Diagnosed in the 90s  . TONSILLECTOMY          Family History  Problem Relation Age of Onset  . Cancer Other   . Heart attack Mother   . Cancer Brother   . Heart attack Brother   . Heart disease Sister        by pass    Social History   Tobacco Use  . Smoking status: Never Smoker  . Smokeless tobacco: Never Used  Vaping Use  . Vaping Use: Never used  Substance Use Topics  . Alcohol use: No  . Drug use: No    Home Medications Prior to Admission medications   Medication Sig Start Date End Date Taking? Authorizing Provider  acetaminophen (TYLENOL) 500 MG tablet Take 1,000 mg by mouth in the morning, at noon, and at bedtime. 03/20/20   [provider]  alfuzosin (UROXATRAL) 10 MG 24 hr tablet Take 10 mg by mouth daily with breakfast.    [provider]  Amino Acids-Protein Hydrolys (PRO-STAT PO) liquid; 15-100 gram-kcal/30 mL; amt: 30 ml; oral Special Instructions: for albumin 2.5 With Meals 03/16/20   [provider]  Ascorbic Acid (VITAMIN C) 1000 MG tablet Take 1,000 mg by mouth daily.    [provider]  aspirin 81 MG chewable tablet Chew 81 mg by mouth daily. 04/15/20   [provider]  Janne Lab Oil [facility]) OINT Special Instructions: Apply to bilateral buttocks & sacral area qshift for blanchable erythema. 03/15/20   [provider]  calcium carbonate (TUMS - DOSED IN MG ELEMENTAL CALCIUM) 500 MG chewable tablet Chew 1 tablet by mouth 2 (two) times daily as needed for indigestion or heartburn.     [provider]  diltiazem (CARDIZEM CD) 360 MG 24 hr capsule Take 360 mg by mouth daily. Start 03/23/2020    [provider]  ferrous sulfate 325 (65 FE) MG tablet Take 325 mg by mouth daily with breakfast. For Anemia and  Iron Deficiency 03/17/20   [provider]  Flax Oil-Fish Oil-Borage Oil (FISH OIL-FLAX OIL-BORAGE OIL) CAPS Take 1 capsule by mouth daily.    [provider]  hydrALAZINE (APRESOLINE) 100 MG tablet Take  100 mg by mouth 3 (three) times daily.    [provider]  lisinopril (ZESTRIL) 40 MG tablet Take 1 tablet (40 mg total) by mouth daily. 03/14/20   Murlean Iba, MD  loperamide (IMODIUM A-D) 2 MG tablet amt: 2mg ; oral Special Instructions: after each stool do not exceed more than 4tabs in 24 hours, As Needed 06/01/20   [provider]  Melatonin 10 MG TABS Take 10 mg by mouth at bedtime.    [provider]  memantine (NAMENDA) 10 MG tablet Take 10 mg by mouth 2 (two) times daily.    [provider]  metoprolol tartrate (LOPRESSOR) 25 MG tablet Take 1 tablet (25 mg total) by mouth 2 (two) times daily. 06/01/20   Murlean Iba, MD  NON FORMULARY Diet -  Mechanical Soft 03/14/20   [provider]  nystatin (MYCOSTATIN) 100000 UNIT/ML suspension Take 5 mLs by mouth 4 (four) times daily. For Oral Thrush 06/06/20 06/13/20  [provider]  omeprazole (PRILOSEC) 20 MG capsule Take 1 capsule (20 mg total) by mouth daily before breakfast. 03/14/20   Johnson, Clanford L, MD  ondansetron (ZOFRAN-ODT) 8 MG disintegrating tablet Take 8 mg by mouth 3 (three) times daily before meals. For vomiting and nausea 06/07/20   [provider]  OXYGEN Inhale 2 L into the lungs continuous. 03/14/20   [provider]  polyethylene glycol (MIRALAX / GLYCOLAX) 17 g packet Take 17 g by mouth daily as needed. 06/01/20   [provider]  potassium chloride SA (KLOR-CON) 20 MEQ tablet Take 20 mEq by mouth 2 (two) times daily.  03/23/20   [provider]  rOPINIRole (REQUIP) 0.25 MG tablet Take 0.25 mg by mouth at bedtime. 04/21/20   [provider]  salicylic acid 17 % gel Apply 1 application topically in the morning and at bedtime. Apply to warts to left hand    [provider]  senna-docusate (SENOKOT-S) 8.6-50 MG tablet Take 1 tablet by mouth at bedtime as needed for mild constipation. 03/14/20   Johnson, Clanford L,  MD  sodium chloride 0.9 % infusion Inject into the vein. Give 75 cc, Special Instructions: for acute renal failure; with physical signs of poor hydration.  Every Shift Day, Evening, Night 06/06/20   [provider]  terazosin (HYTRIN) 5 MG capsule Take 5 mg by mouth at bedtime.  03/14/20   [provider]  venlafaxine XR (EFFEXOR-XR) 150 MG 24 hr capsule Take 150 mg by mouth daily with breakfast. 06/01/20   [provider]    Allergies    Patient has no known allergies.  Review of Systems   Review of Systems  Unable to perform ROS: Dementia  Constitutional: Negative for fever.  Eyes: Negative for visual disturbance.  Respiratory: Negative for cough and shortness of breath.   Cardiovascular: Negative for chest pain.  Gastrointestinal: Positive for abdominal pain, diarrhea, flatus and nausea. Negative for vomiting.  Genitourinary: Negative for dysuria.  Musculoskeletal: Negative for neck pain.  Skin: Positive for rash (scrotum).  Neurological: Negative for headaches.    Physical Exam Updated Vital Signs BP (!) 117/58 (BP Location: Left Arm)   Pulse 79   Temp 98 F (36.7 C) (Oral)   Resp (!) 32   Ht 5\' 9"  (1.753 m)   Wt 84.4 kg   SpO2 94%   BMI 27.47 kg/m   Physical Exam Vitals and nursing note reviewed.  Constitutional:      Appearance: Normal appearance. He is well-developed and well-nourished.  HENT:     Head: Normocephalic and atraumatic.  Eyes:     Conjunctiva/sclera: Conjunctivae normal.  Cardiovascular:     Rate and Rhythm: Normal rate and regular rhythm.     Heart sounds: No murmur heard.   Pulmonary:     Effort: Pulmonary effort is normal. No respiratory distress.     Breath sounds: Normal breath sounds.  Abdominal:     General: Abdomen is protuberant. There is distension.     Tenderness: There is generalized abdominal tenderness. There is no guarding or rebound.  Genitourinary:    Comments: Scrotum somewhat swollen and  reddened. Musculoskeletal:     Cervical back: Neck supple.     Right lower leg: Edema present.     Left lower leg: Edema present.  Skin:    General: Skin is warm and dry.  Neurological:     General: No focal deficit present.     Mental Status: He is alert.  Psychiatric:        Mood and Affect: Mood and affect normal.     ED Results / Procedures / Treatments   Labs (all labs ordered are listed, but only abnormal results are displayed) Labs Reviewed  COMPREHENSIVE METABOLIC PANEL - Abnormal; Notable for the following components:      Result Value   Potassium 5.3 (*)    CO2 20 (*)    Glucose, Bld 107 (*)    BUN 52 (*)    Creatinine, Ser 1.64 (*)    Total Protein 5.4 (*)    Albumin 2.3 (*)    AST 11 (*)    GFR, Estimated 40 (*)    All other components within normal limits  CBC WITH DIFFERENTIAL/PLATELET - Abnormal; Notable for the following components:   RBC 4.00 (*)    Hemoglobin 10.2 (*)    HCT 33.5 (*)    MCH 25.5 (*)    RDW 15.9 (*)    Platelets 413 (*)    Neutro Abs 7.8 (*)    Abs Immature Granulocytes 0.13 (*)    All other components within normal limits  PROTIME-INR - Abnormal; Notable for the following components:   INR 1.3 (*)    All other components within normal limits  RESP PANEL BY RT-PCR (FLU A&B, COVID) ARPGX2  GASTROINTESTINAL PANEL BY PCR, STOOL (REPLACES STOOL CULTURE)  C DIFFICILE QUICK SCREEN W PCR REFLEX  LIPASE, BLOOD  LACTIC ACID, PLASMA  COMPREHENSIVE METABOLIC PANEL  CBC    EKG EKG Interpretation  Date/Time:  Friday June 09 2020 11:25:26 EST Ventricular Rate:  75 PR Interval:    QRS Duration: 88 QT Interval:  385 QTC Calculation: 430 R Axis:   19 Text Interpretation: Sinus rhythm Prolonged PR interval Low voltage, precordial leads No significant change since prior 12/21 Confirmed by Meridee Score [phone]) on 06/09/2020 11:49:54 AM   Radiology US Abdomen Complete  Result Date: 06/09/2020 CLINICAL DATA:  Right upper  quadrant pain EXAM: ABDOMEN ULTRASOUND COMPLETE COMPARISON:  Ultrasound 03/12/2020, CT 06/09/2020 FINDINGS: Gallbladder: Gallbladder appears distended. Moderate sludge within the gallbladder lumen. Echodense foci in the sludge may reflect small stones. Slight increased wall thickness of 3.6 mm. Small amount of pericholecystic fluid. Negative sonographic Murphy. Gallbladder polyp up to 6 mm. Common bile duct: Diameter: 6 mm Liver: Within normal limits for echogenicity. No focal hepatic abnormality. Hepatic cyst on CT not well seen on sonography. Portal vein is patent on color Doppler imaging with normal direction of blood flow towards the liver. IVC: No abnormality visualized. Pancreas: Visualized portion unremarkable. Spleen: Size and appearance within normal limits. Right Kidney: Length: 11 cm. Borderline to mild renal cortical thickening. No hydronephrosis. Multiple cysts in the right kidney, the largest is seen in the lower pole and measures 5.3 cm. Left Kidney: Length: 12 cm. Echogenicity normal. No hydronephrosis. Dominant cyst in the upper pole measuring 6.6 cm. Abdominal aorta: No aneurysm visualized. Other findings: Incidental note made of bilateral pleural effusions. IMPRESSION: 1. Moderate gallbladder sludge with probable punctate stones. Slight increased wall thickness with small amount of fluid adjacent to the gallbladder but negative sonographic Murphy. Findings are suggestive of but not definitive for cholecystitis; consider correlation with nuclear medicine hepatobiliary imaging. 6 mm gallbladder polyp. 2. Simple appearing renal cysts. 3. Incidental note made of pleural effusions.  Electronically Signed   By: Jasmine Pang M.D.   On: 06/09/2020 16:12   CT Abdomen Pelvis W Contrast  Result Date: 06/09/2020 CLINICAL DATA:  Abdominal distension EXAM: CT ABDOMEN AND PELVIS WITH CONTRAST TECHNIQUE: Multidetector CT imaging of the abdomen and pelvis was performed using the standard protocol following  bolus administration of intravenous contrast. CONTRAST:  103mL OMNIPAQUE IOHEXOL 300 MG/ML  SOLN COMPARISON:  06/05/2020 FINDINGS: Lower chest: Small bilateral pleural effusions. Bibasilar atelectasis. Diffuse coronary artery calcifications. Heart is normal size. Hepatobiliary: Gallbladder is distended. Layering stones and possible sludge within the gallbladder. Slight indistinctness noted around the gallbladder concerning for cholecystitis. Mild periportal edema. No biliary ductal dilatation. Small cyst in the right hepatic lobe 16 mm. Pancreas: No focal abnormality or ductal dilatation. Spleen: No focal abnormality.  Normal size. Adrenals/Urinary Tract: Bilateral renal cysts. Slight fullness of the left renal collecting system, stable since prior study. No stones or overt hydronephrosis. Urinary bladder unremarkable. Stomach/Bowel: Sigmoid and descending colonic diverticulosis. There is wall thickening noted from the distal transverse colon through the mid to distal descending colon concerning for colitis. Slight surrounding inflammation. Remainder of the colon, stomach and small bowel unremarkable. Vascular/Lymphatic: Heavily calcified aorta and iliac vessels. No aneurysm or adenopathy. Reproductive: Mildly prominent prostate. Other: No free fluid or free air. Musculoskeletal: No acute bony abnormality. IMPRESSION: Cholelithiasis. Haziness noted around the gallbladder. Cannot exclude acute cholecystitis. Continued wall thickening in the left colon from the distal transverse colon through the distal descending colon concerning for colitis. Left colonic diverticulosis. Small bilateral pleural effusions, increasing since prior study. Bibasilar atelectasis. Coronary artery disease, aortic atherosclerosis. Electronically Signed   By: Charlett Nose M.D.   On: 06/09/2020 13:39   DG Abd Acute W/Chest  Result Date: 06/09/2020 CLINICAL DATA:  Abdominal distension. EXAM: DG ABDOMEN ACUTE WITH 1 VIEW CHEST COMPARISON:   None. FINDINGS: No small bowel dilatation is noted. Dilated and air-filled colon is noted suggesting possible ileus. No radiopaque calculi or other significant radiographic abnormality is seen. Heart size and mediastinal contours are within normal limits. Both lungs are clear. IMPRESSION: Dilated and air-filled colon is noted suggesting possible ileus. No acute cardiopulmonary disease. Electronically Signed   By: Lupita Raider M.D.   On: 06/09/2020 12:12    Procedures Procedures (including critical care time)  Medications Ordered in ED Medications  aspirin chewable tablet 81 mg (has no administration in time range)  acetaminophen (TYLENOL) tablet 1,000 mg (1,000 mg Oral Given 06/09/20 1604)  diltiazem (CARDIZEM CD) 24 hr capsule 360 mg (has no administration in time range)  metoprolol tartrate (LOPRESSOR) tablet 25 mg (has no administration in time range)  memantine (NAMENDA) tablet 10 mg (has no administration in time range)  venlafaxine XR (EFFEXOR-XR) 24 hr capsule 150 mg (has no administration in time range)  loperamide (IMODIUM) capsule 2 mg (has no administration in time range)  pantoprazole (PROTONIX) EC tablet 40 mg (has no administration in time range)  alfuzosin (UROXATRAL) 24 hr tablet 10 mg (has no administration in time range)  melatonin tablet 9 mg (has no administration in time range)  rOPINIRole (REQUIP) tablet 0.25 mg (has no administration in time range)  enoxaparin (LOVENOX) injection 40 mg (has no administration in time range)  dextrose 5 % in lactated ringers infusion ( Intravenous New Bag/Given 06/09/20 1611)  HYDROmorphone (DILAUDID) injection 0.5-1 mg (has no administration in time range)  ondansetron (ZOFRAN) tablet 4 mg (has no administration in time range)    Or  ondansetron (ZOFRAN)  injection 4 mg (has no administration in time range)  sodium chloride 0.9 % bolus 1,000 mL (0 mLs Intravenous Stopped 06/09/20 1237)  morphine 4 MG/ML injection 4 mg (4 mg  Intravenous Given 06/09/20 1126)  0.9 %  sodium chloride infusion ( Intravenous Stopped 06/09/20 1610)  iohexol (OMNIPAQUE) 300 MG/ML solution 75 mL (75 mLs Intravenous Contrast Given 06/09/20 1317)    ED Course  I have reviewed the triage vital signs and the nursing notes.  Pertinent labs & imaging results that were available during my care of the patient were reviewed by me and considered in my medical decision making (see chart for details).  Clinical Course as of 06/09/20 1721  Fri Jun 09, 2020  1120 Patient was admitted December 13-23 for colitis rectal bleeding.  Stool studies and C. difficile were negative at that time.  Improved with antibiotics. [MB]  [id number] Discussed with Dr. Abbey Chatters GI.  He is recommend the patient get a CAT scan as it sounds like he is progressed since his last CT 4 days ago. [MB]  1306 Acute abdominal series interpreted by me as large bowel distention, no acute infiltrates [MB]  1411 Reviewed case with Dr. Constance Haw general surgery along with Dr. Abbey Chatters.  Dr. Abbey Chatters has been recommending the patient be admitted back to the medical service bowel rest.  He does not think antibiotics are indicated currently.  May end up needing endoscopy colonoscopy.  Dr. Constance Haw recommended upper quadrant ultrasound if the patient becomes symptomatic. [MB]  47 Discussed with [facility] hospitalist Dr. Loleta Books who will evaluate the patient for admission [MB]    Clinical Course User Index [MB] Hayden Rasmussen, MD   MDM Rules/Calculators/A&P                         This patient complains of abdominal distention diarrhea; this involves an extensive number of treatment Options and is a complaint that carries with it a high risk of complications and Morbidity. The differential includes C. difficile, colitis, diverticulitis, metabolic derangement, GI bleed  I ordered, reviewed and interpreted labs, which included CBC with normal white count, stable hemoglobin, chemistries with mildly  elevated creatinine and potassium, Covid and flu testing negative, lactate not elevated, INR slightly elevated I ordered medication IV fluids and pain medicine I ordered imaging studies which included CT abdomen pelvis and I independently    visualized and interpreted imaging which showed gallstones, colonic dilatation and thickening  Previous records obtained and reviewed in epic including recent admission for similar presentation I consulted general surgery Dr. Constance Haw, GI Dr. Abbey Chatters and [facility] hospitalist Dr. Eudelia Bunch and discussed lab and imaging findings  Critical Interventions: None  After the interventions stated above, I reevaluated the patient and found patient to be minimally symptomatic.  He still has a distended abdomen tender though.  You need to be admitted to the hospital for further work-up.   Final Clinical Impression(s) / ED Diagnoses Final diagnoses:  Colitis  Calculus of gallbladder without cholecystitis without obstruction    Rx / DC Orders ED Discharge Orders    None       Hayden Rasmussen, MD 06/09/20 1725

## 2020-06-09 NOTE — Plan of Care (Signed)

## 2020-06-10 DIAGNOSIS — K559 Vascular disorder of intestine, unspecified: Secondary | ICD-10-CM | POA: Diagnosis not present

## 2020-06-10 LAB — COMPREHENSIVE METABOLIC PANEL
ALT: 14 U/L (ref 0–44)
AST: 9 U/L — ABNORMAL LOW (ref 15–41)
Albumin: 2.2 g/dL — ABNORMAL LOW (ref 3.5–5.0)
Alkaline Phosphatase: 85 U/L (ref 38–126)
Anion gap: 5 (ref 5–15)
BUN: 39 mg/dL — ABNORMAL HIGH (ref 8–23)
CO2: 21 mmol/L — ABNORMAL LOW (ref 22–32)
Calcium: 9.5 mg/dL (ref 8.9–10.3)
Chloride: 111 mmol/L (ref 98–111)
Creatinine, Ser: 1.2 mg/dL (ref 0.61–1.24)
GFR, Estimated: 58 mL/min — ABNORMAL LOW (ref >60)
Glucose, Bld: 126 mg/dL — ABNORMAL HIGH (ref 70–99)
Potassium: 4.4 mmol/L (ref 3.5–5.1)
Sodium: 137 mmol/L (ref 135–145)
Total Bilirubin: 0.3 mg/dL (ref 0.3–1.2)
Total Protein: 5.2 g/dL — ABNORMAL LOW (ref 6.5–8.1)

## 2020-06-10 LAB — CBC
HCT: 34.2 % — ABNORMAL LOW (ref 39.0–52.0)
Hemoglobin: 10.5 g/dL — ABNORMAL LOW (ref 13.0–17.0)
MCH: 25.6 pg — ABNORMAL LOW (ref 26.0–34.0)
MCHC: 30.7 g/dL (ref 30.0–36.0)
MCV: 83.4 fL (ref 80.0–100.0)
Platelets: 417 10*3/uL — ABNORMAL HIGH (ref 150–400)
RBC: 4.1 MIL/uL — ABNORMAL LOW (ref 4.22–5.81)
RDW: 16.1 % — ABNORMAL HIGH (ref 11.5–15.5)
WBC: 12.6 10*3/uL — ABNORMAL HIGH (ref 4.0–10.5)
nRBC: 0 % (ref 0.0–0.2)

## 2020-06-10 MED ORDER — HYDRALAZINE HCL 10 MG PO TABS
10.0000 mg | ORAL_TABLET | Freq: Three times a day (TID) | ORAL | Status: DC
  Administered 2020-06-10 – 2020-06-14 (×10): 10 mg via ORAL
  Filled 2020-06-10 (×11): qty 1

## 2020-06-10 NOTE — Progress Notes (Signed)
[facility] PROGRESS NOTE    Jeffrey Sherman  [id number] DOB: 13-Apr-1930 DOA: 06/09/2020 PCP: Gerlene Fee, NP      Brief Narrative:  Jeffrey Sherman is a 87 y.o. M with hx ILD on home O2, moderate dementia, wheelchair bound last 6 months, lives at [facility], prostate cancer remote, HTN and PVD as well as recent admission for colitis, presumed ischemic versus infectious who presents with progressive abdominal pain, nausea, diarrhea and abdominal distension.  Patient was admitted with identical symptoms plus hematochezia earlier this month for 10 days. CT showed colitis, started on antibiotics.  GI consulted. Management was supportive.  Cdiff and GI PCR panel negative.  Imprved.  CTA abdomen showed tandem stenosis of IMA but patent celiac and SMA.  WBC, hematochezia, pain and diarrhea had some improvement and patient was discharged.  Since discharge 7 days ago, he was able to tolerate PO intake but still had nausea, abdominal pain.  This progressively got worse and so he was started on IV fluids, but had progressively worse abdominal pain, all over, frequent loose stools and nausea and inability to eat.  Creatinine started to worsen, and so patient was sent to hospital for GI and Gen Surg consultation.  In the ER, CT abdomen showed persistent left sided colitis but also GB distension and haziness.  Creatinine 1.6.  Afebrile, and without leukocytosis.      Assessment & Plan:   Colitis Differential still includes infectious, ischemic, and inflammatory colitis. -Consult gastroenterology, appreciate recommendations -Plan for flexible sigmoidoscopy tomorrow -We will continue bowel rest, IV fluids, and IV hydromorphone and Zofran for the patient is still quite symptomatic   -Continue aspirin  -Consult gastroenterology, general surgery, appreciate cares    Right upper quadrant pain LFTs normal but CT suggests cholecystitis and pain on RUQ exam.   Ultrasound equivocal.  General Surgery opine this is not cholecystitis and agree, patient is exceedingly bad surgical candidate.  Will obtain HIDA scan -Obtain HIDA  Acute kidney injury Hyperkalemia Creatinine improved to 1.2 today  -Hold lisinopril -Continue IV fluids  -Trend BMP  Interstitial lung disease Chronic hypoxic respiratory failure Not on disease specific therapy.   -Continue home O2  Hypertension BP elevated -Continue aspirin, diltiazem, metoprolol -Resume low dose hydralazine -Hold lisinopril  Dementia Mild acute metabolic encephalopathy due to colitis  -Continue venlafaxine -Dementia precautions  RLS -Continue  Requip  History prostate cancer  -Continue alfuzosin  Hx PUD - COntinue  PPI  Moderate protein calorie malnutrition As evidenced by albumin 2.2, moderate diffuse loss of subcutaneous muscle mass and fat, poor p.o. intake over the last month due to colitis        Disposition: Status is: Inpatient  Remains inpatient appropriate because:IV treatments appropriate due to intensity of illness or inability to take PO   Dispo: The patient is from: SNF              Anticipated d/c is to: SNF              Anticipated d/c date is: > 3 days              Patient currently is not medically stable to d/c.              MDM: The below labs and imaging reports were reviewed and summarized above.  Medication management as above.   DVT prophylaxis: enoxaparin (LOVENOX) injection 40 mg Start: 06/09/20 2200  Code Status: DNR Family Communication: Son by phone  Subjective: Somewhat confused.  Still with abdominal pain, naueas, loose stools.  No fever.  No vomiting.  Objective: Vitals:   06/09/20 2208 06/10/20 0140 06/10/20 0649 06/10/20 0811  BP:  (!) 157/72 (!) 179/87 (!) 146/73  Pulse:  84 (!) 101 98  Resp:  (!) 21 18   Temp: 98.1 F (36.7 C) (!) 97.5 F (36.4 C) 98.1 F (36.7 C)   TempSrc:  Oral Oral    SpO2:  95% 93% 93%  Weight:      Height:        Intake/Output Summary (Last 24 hours) at 06/10/2020 1751 Last data filed at 06/10/2020 1700 Gross per 24 hour  Intake 2144.38 ml  Output 750 ml  Net 1394.38 ml   Filed Weights   06/09/20 1105  Weight: 84.4 kg    Examination: General appearance:  adult male, alert and in no acute distress.  Lying in bed, uncomfortable, confused HEENT: Anicteric, conjunctiva pink, lids and lashes normal. No nasal deformity, discharge, epistaxis.  Lips moist, mostly edentulous, op moist no oral lesions, hearing diminished.   Skin: Warm and dry.  No jaundice.  No suspicious rashes or lesions. Cardiac: RRR, nl S1-S2, no murmurs appreciated.  Capillary refill is brisk.  JVP not visible.  1+ LE edema.  Radial pulses 2+ and symmetric. Respiratory: Normal respiratory rate and rhythm.  CTAB without rales or wheezes. Abdomen: Abdomen soft.  Mild diffuse TTP with guarding on the RUQ.  Distended. No ascites  MSK: No deformities or effusions.  Diffuse loss of subcutaneous muscle mass and fat Neuro: Awake and alert but somewhat confused.  EOMI, moves all extremities with severe generalized weakness. Speech fluent.    Psych: Sensorium intact and responding to questions, attention diminished, confused to place.  Judgment and insight appear impaired.    Data Reviewed: I have personally reviewed following labs and imaging studies:  CBC: Recent Labs  Lab 06/05/20 0710 06/07/20 0627 06/09/20 1117 06/10/20 0644  WBC 13.6* 11.9* 9.6 12.6*  NEUTROABS  --  8.9* 7.8*  --   HGB 10.2* 9.6* 10.2* 10.5*  HCT 32.9* 29.8* 33.5* 34.2*  MCV 83.5 82.8 83.8 83.4  PLT 266 318 413* [id number]*   Basic Metabolic Panel: Recent Labs  Lab 06/07/20 0627 06/08/20 0330 06/09/20 0650 06/09/20 1117 06/10/20 0644  NA 137 135 133* 136 137  K 3.9 4.3 4.7 5.3* 4.4  CL 109 110 110 111 111  CO2 20* 20* 19* 20* 21*  GLUCOSE 114* 107* 105* 107* 126*  BUN 42* 57* 53* 52* 39*  CREATININE  1.47* 1.81* 1.59* 1.64* 1.20  CALCIUM 8.8* 8.9 8.9 9.3 9.5   GFR: Estimated Creatinine Clearance: 42.6 mL/min (by C-G formula based on SCr of 1.2 mg/dL). Liver Function Tests: Recent Labs  Lab 06/07/20 0627 06/09/20 1117 06/10/20 0644  AST 9* 11* 9*  ALT 11 15 14   ALKPHOS 68 82 85  BILITOT 0.2* 0.4 0.3  PROT 4.9* 5.4* 5.2*  ALBUMIN 2.1* 2.3* 2.2*   Recent Labs  Lab 06/09/20 1117  LIPASE 27   No results for input(s): AMMONIA in the last 168 hours. Coagulation Profile: Recent Labs  Lab 06/09/20 1117  INR 1.3*   Cardiac Enzymes: No results for input(s): CKTOTAL, CKMB, CKMBINDEX, TROPONINI in the last 168 hours. BNP (last 3 results) No results for input(s): PROBNP in the last 8760 hours. HbA1C: No results for input(s): HGBA1C in the last 72 hours. CBG: No results for input(s): GLUCAP in the last 168 hours. Lipid  Profile: No results for input(s): CHOL, HDL, LDLCALC, TRIG, CHOLHDL, LDLDIRECT in the last 72 hours. Thyroid Function Tests: No results for input(s): TSH, T4TOTAL, FREET4, T3FREE, THYROIDAB in the last 72 hours. Anemia Panel: No results for input(s): VITAMINB12, FOLATE, FERRITIN, TIBC, IRON, RETICCTPCT in the last 72 hours. Urine analysis:    Component Value Date/Time   COLORURINE YELLOW 05/22/2020 1111   APPEARANCEUR CLEAR 05/22/2020 1111   APPEARANCEUR Clear 02/15/2020 1321   LABSPEC 1.016 05/22/2020 1111   PHURINE 5.0 05/22/2020 1111   GLUCOSEU 150 (A) 05/22/2020 1111   HGBUR NEGATIVE 05/22/2020 1111   BILIRUBINUR NEGATIVE 05/22/2020 1111   BILIRUBINUR Negative 02/15/2020 1321   KETONESUR NEGATIVE 05/22/2020 1111   PROTEINUR >=300 (A) 05/22/2020 1111   UROBILINOGEN negative (A) 10/19/2019 0958   UROBILINOGEN 0.2 05/08/2014 2045   NITRITE NEGATIVE 05/22/2020 1111   LEUKOCYTESUR NEGATIVE 05/22/2020 1111   Sepsis Labs: @LABRCNTIP (procalcitonin:4,lacticacidven:4)  ) Recent Results (from the past 240 hour(s))  SARS Coronavirus 2 by RT PCR  (hospital order, performed in [facility] hospital lab) Nasopharyngeal Nasopharyngeal Swab     Status: None   Collection Time: 06/01/20  9:08 AM   Specimen: Nasopharyngeal Swab  Result Value Ref Range Status   SARS Coronavirus 2 NEGATIVE NEGATIVE Final    Comment: Performed at [facility], [address] [place], [place] [address]  Resp Panel by RT-PCR (Flu A&B, Covid) Nasopharyngeal Swab     Status: None   Collection Time: 06/09/20 12:25 PM   Specimen: Nasopharyngeal Swab; Nasopharyngeal(NP) swabs in vial transport medium  Result Value Ref Range Status   SARS Coronavirus 2 by RT PCR NEGATIVE NEGATIVE Final    Comment: (NOTE) SARS-CoV-2 target nucleic acids are NOT DETECTED.  The SARS-CoV-2 RNA is generally detectable in upper respiratory specimens during the acute phase of infection. The lowest concentration of SARS-CoV-2 viral copies this assay can detect is 138 copies/mL. A negative result does not preclude SARS-Cov-2 infection and should not be used as the sole basis for treatment or other patient management decisions. A negative result may occur with  improper specimen collection/handling, submission of specimen other than nasopharyngeal swab, presence of viral mutation(s) within the areas targeted by this assay, and inadequate number of viral copies(<138 copies/mL). A negative result must be combined with clinical observations, patient history, and epidemiological information. The expected result is Negative.  Fact Sheet for Patients:  EntrepreneurPulse.com.au  Fact Sheet for Healthcare Providers:  IncredibleEmployment.be  This test is no t yet approved or cleared by the [place] FDA and  has been authorized for detection and/or diagnosis of SARS-CoV-2 by FDA under an Emergency Use Authorization (EUA). This EUA will remain  in effect (meaning this test can be used) for the duration of the COVID-19 declaration under Section 564(b)(1)  of the Act, 21 U.S.C.section 360bbb-3(b)(1), unless the authorization is terminated  or revoked sooner.       Influenza A by PCR NEGATIVE NEGATIVE Final   Influenza B by PCR NEGATIVE NEGATIVE Final    Comment: (NOTE) The Xpert Xpress SARS-CoV-2/FLU/RSV plus assay is intended as an aid in the diagnosis of influenza from Nasopharyngeal swab specimens and should not be used as a sole basis for treatment. Nasal washings and aspirates are unacceptable for Xpert Xpress SARS-CoV-2/FLU/RSV testing.  Fact Sheet for Patients: EntrepreneurPulse.com.au  Fact Sheet for Healthcare Providers: IncredibleEmployment.be  This test is not yet approved or cleared by the [place] FDA and has been authorized for detection and/or diagnosis of SARS-CoV-2 by FDA  under an Emergency Use Authorization (EUA). This EUA will remain in effect (meaning this test can be used) for the duration of the COVID-19 declaration under Section 564(b)(1) of the Act, 21 U.S.C. section 360bbb-3(b)(1), unless the authorization is terminated or revoked.  Performed at [facility], [address] [place], [place] [address]          Radiology Studies: US Abdomen Complete  Result Date: 06/09/2020 CLINICAL DATA:  Right upper quadrant pain EXAM: ABDOMEN ULTRASOUND COMPLETE COMPARISON:  Ultrasound 03/12/2020, CT 06/09/2020 FINDINGS: Gallbladder: Gallbladder appears distended. Moderate sludge within the gallbladder lumen. Echodense foci in the sludge may reflect small stones. Slight increased wall thickness of 3.6 mm. Small amount of pericholecystic fluid. Negative sonographic Murphy. Gallbladder polyp up to 6 mm. Common bile duct: Diameter: 6 mm Liver: Within normal limits for echogenicity. No focal hepatic abnormality. Hepatic cyst on CT not well seen on sonography. Portal vein is patent on color Doppler imaging with normal direction of blood flow towards the liver. IVC: No abnormality  visualized. Pancreas: Visualized portion unremarkable. Spleen: Size and appearance within normal limits. Right Kidney: Length: 11 cm. Borderline to mild renal cortical thickening. No hydronephrosis. Multiple cysts in the right kidney, the largest is seen in the lower pole and measures 5.3 cm. Left Kidney: Length: 12 cm. Echogenicity normal. No hydronephrosis. Dominant cyst in the upper pole measuring 6.6 cm. Abdominal aorta: No aneurysm visualized. Other findings: Incidental note made of bilateral pleural effusions. IMPRESSION: 1. Moderate gallbladder sludge with probable punctate stones. Slight increased wall thickness with small amount of fluid adjacent to the gallbladder but negative sonographic Murphy. Findings are suggestive of but not definitive for cholecystitis; consider correlation with nuclear medicine hepatobiliary imaging. 6 mm gallbladder polyp. 2. Simple appearing renal cysts. 3. Incidental note made of pleural effusions. Electronically Signed   By: Donavan Foil M.D.   On: 06/09/2020 16:12   CT Abdomen Pelvis W Contrast  Result Date: 06/09/2020 CLINICAL DATA:  Abdominal distension EXAM: CT ABDOMEN AND PELVIS WITH CONTRAST TECHNIQUE: Multidetector CT imaging of the abdomen and pelvis was performed using the standard protocol following bolus administration of intravenous contrast. CONTRAST:  45mL OMNIPAQUE IOHEXOL 300 MG/ML  SOLN COMPARISON:  06/05/2020 FINDINGS: Lower chest: Small bilateral pleural effusions. Bibasilar atelectasis. Diffuse coronary artery calcifications. Heart is normal size. Hepatobiliary: Gallbladder is distended. Layering stones and possible sludge within the gallbladder. Slight indistinctness noted around the gallbladder concerning for cholecystitis. Mild periportal edema. No biliary ductal dilatation. Small cyst in the right hepatic lobe 16 mm. Pancreas: No focal abnormality or ductal dilatation. Spleen: No focal abnormality.  Normal size. Adrenals/Urinary Tract: Bilateral  renal cysts. Slight fullness of the left renal collecting system, stable since prior study. No stones or overt hydronephrosis. Urinary bladder unremarkable. Stomach/Bowel: Sigmoid and descending colonic diverticulosis. There is wall thickening noted from the distal transverse colon through the mid to distal descending colon concerning for colitis. Slight surrounding inflammation. Remainder of the colon, stomach and small bowel unremarkable. Vascular/Lymphatic: Heavily calcified aorta and iliac vessels. No aneurysm or adenopathy. Reproductive: Mildly prominent prostate. Other: No free fluid or free air. Musculoskeletal: No acute bony abnormality. IMPRESSION: Cholelithiasis. Haziness noted around the gallbladder. Cannot exclude acute cholecystitis. Continued wall thickening in the left colon from the distal transverse colon through the distal descending colon concerning for colitis. Left colonic diverticulosis. Small bilateral pleural effusions, increasing since prior study. Bibasilar atelectasis. Coronary artery disease, aortic atherosclerosis. Electronically Signed   By: Rolm Baptise M.D.   On: 06/09/2020  13:39   DG Abd Acute W/Chest  Result Date: 06/09/2020 CLINICAL DATA:  Abdominal distension. EXAM: DG ABDOMEN ACUTE WITH 1 VIEW CHEST COMPARISON:  None. FINDINGS: No small bowel dilatation is noted. Dilated and air-filled colon is noted suggesting possible ileus. No radiopaque calculi or other significant radiographic abnormality is seen. Heart size and mediastinal contours are within normal limits. Both lungs are clear. IMPRESSION: Dilated and air-filled colon is noted suggesting possible ileus. No acute cardiopulmonary disease. Electronically Signed   By: Lupita Raider M.D.   On: 06/09/2020 12:12        Scheduled Meds: . acetaminophen  1,000 mg Oral TID  . alfuzosin  10 mg Oral Q breakfast  . aspirin  81 mg Oral Daily  . diltiazem  360 mg Oral Daily  . enoxaparin (LOVENOX) injection  40 mg  Subcutaneous Q24H  . melatonin  9 mg Oral QHS  . memantine  10 mg Oral BID  . metoprolol tartrate  25 mg Oral BID  . pantoprazole  40 mg Oral Daily  . rOPINIRole  0.25 mg Oral QHS  . venlafaxine XR  150 mg Oral Q breakfast   Continuous Infusions: . dextrose 5% lactated ringers 100 mL/hr at 06/10/20 0100     LOS: 1 day    Time spent: 35 minutes   Alberteen Sam, MD [facility] 06/10/2020, 5:51 PM     Please page though AMION or Epic secure chat:  For [employer], Higher education careers adviser

## 2020-06-10 NOTE — Consult Note (Signed)
Consulting  Provider: Loleta Books Primary Care Physician:  Gerlene Fee, NP Primary Gastroenterologist:  Dr. Laural Golden  Reason for Consultation:  Colitis, diarrhea, abdominal pain  HPI:  Jeffrey Sherman is a 87 y.o. male with a past medical history of interstitial lung disease, moderate dementia, recent colitis, peripheral vascular disease, who was admitted to [facility] yesterday evening after presenting with abdominal pain, diarrhea.    Patient with a recent admission to our facility 05/22/2020, but at that time also having hematochezia.  CT during that admission showed left-sided colitis and patient completed a course of antibiotics.  C. difficile and GI panel PCR were both negative.  He did undergo CT abdomen pelvis which showed stenosis of the IMA but patent celiac and SMA.  Patient improved with conservative measures, no endoscopy was performed at that time.  Since discharge, patient was doing better, diarrhea improved, but still having mild abdominal pain.  Pain is all over his abdomen, no radiation.  States it is constant.  Diarrhea then returned and he was started on IV fluids.  Concern for dehydration and worsening abdominal distention at the [facility] so patient was brought back to the ER for evaluation.    Repeat CT abdomen pelvis showed persistent left-sided colitis also noted gallbladder distention and haziness.  Subsequent ultrasound revealed gallbladder sludge with probable punctate stones and slight increased wall thickness as well as fluid adjacent to the gallbladder.    Past Medical History:  Diagnosis Date  . Anxiety   . Cancer [facility])    prostate  . Chronic chest wall pain   . Essential hypertension, benign 06/20/2016  . GAD (generalized anxiety disorder)   . GERD (gastroesophageal reflux disease)   . H/O echocardiogram 2016   normal  . Heart murmur    "leaking heart valve"  . High cholesterol 06/20/2016  . HOH (hard of hearing)    left  . Hx of falling   .  Hyperlipidemia   . Interstitial pulmonary disease ([place])   . MDD (major depressive disorder)   . Murmur, cardiac   . Poor historian   . PUD (peptic ulcer disease)   . PUD (peptic ulcer disease)   . RLS (restless legs syndrome)   . Shortness of breath   . Wrist fracture 12/16/2011    Past Surgical History:  Procedure Laterality Date  . COLONOSCOPY  2005   Rehman: Sigmoid colon diverticulosis, moderate sized external hemorrhoids. A few focal areas of mucosal erythema felt to be nonspecific.  Marland Kitchen ESOPHAGOGASTRODUODENOSCOPY    . ESOPHAGOGASTRODUODENOSCOPY N/A 07/18/2016   Rehman: normal exam except duodenal scar. h.pylori serologies negative  . HIP PINNING,CANNULATED Right 03/10/2020   Procedure: INTERNAL FIXATION RIGHT HIP;  Surgeon: Carole Civil, MD;  Location: [facility];  Service: Orthopedics;  Laterality: Right;  . ORIF WRIST FRACTURE  12/19/2011   Procedure: OPEN REDUCTION INTERNAL FIXATION (ORIF) WRIST FRACTURE;  Surgeon: Carole Civil, MD;  Location: [facility]S;  Service: Orthopedics;  Laterality: Right;  . prostate cancer     Diagnosed in the 90s  . TONSILLECTOMY      Prior to Admission medications   Medication Sig Start Date End Date Taking? Authorizing Provider  acetaminophen (TYLENOL) 500 MG tablet Take 1,000 mg by mouth in the morning, at noon, and at bedtime. 03/20/20  Yes [provider]  alfuzosin (UROXATRAL) 10 MG 24 hr tablet Take 10 mg by mouth daily with breakfast.   Yes [provider]  Amino Acids-Protein Hydrolys (PRO-STAT PO) liquid;  15-100 gram-kcal/30 mL; amt: 30 ml; oral Special Instructions: for albumin 2.5 With Meals 03/16/20  Yes [provider]  Ascorbic Acid (VITAMIN C) 1000 MG tablet Take 1,000 mg by mouth daily.   Yes [provider]  aspirin 81 MG chewable tablet Chew 81 mg by mouth daily. 04/15/20  Yes [provider]  Janne Lab Oil [facility]) OINT Special Instructions: Apply to bilateral buttocks &  sacral area qshift for blanchable erythema. 03/15/20  Yes [provider]  calcium carbonate (TUMS - DOSED IN MG ELEMENTAL CALCIUM) 500 MG chewable tablet Chew 1 tablet by mouth 2 (two) times daily as needed for indigestion or heartburn.    Yes [provider]  diltiazem (CARDIZEM CD) 360 MG 24 hr capsule Take 360 mg by mouth daily. Start 03/23/2020   Yes [provider]  ferrous sulfate 325 (65 FE) MG tablet Take 325 mg by mouth daily with breakfast. For Anemia and  Iron Deficiency 03/17/20  Yes [provider]  Flax Oil-Fish Oil-Borage Oil (FISH OIL-FLAX OIL-BORAGE OIL) CAPS Take 1 capsule by mouth daily.   Yes [provider]  hydrALAZINE (APRESOLINE) 100 MG tablet Take 100 mg by mouth 3 (three) times daily.   Yes [provider]  lisinopril (ZESTRIL) 40 MG tablet Take 1 tablet (40 mg total) by mouth daily. 03/14/20  Yes Johnson, Clanford L, MD  loperamide (IMODIUM A-D) 2 MG tablet amt: 2mg ; oral Special Instructions: after each stool do not exceed more than 4tabs in 24 hours, As Needed 06/01/20  Yes [provider]  Melatonin 10 MG TABS Take 10 mg by mouth at bedtime.   Yes [provider]  memantine (NAMENDA) 10 MG tablet Take 10 mg by mouth 2 (two) times daily.   Yes [provider]  metoprolol tartrate (LOPRESSOR) 25 MG tablet Take 1 tablet (25 mg total) by mouth 2 (two) times daily. 06/01/20  Yes Johnson, Clanford L, MD  nystatin (MYCOSTATIN) 100000 UNIT/ML suspension Take 5 mLs by mouth 4 (four) times daily. For Oral Thrush 06/06/20 06/13/20 Yes [provider]  omeprazole (PRILOSEC) 20 MG capsule Take 1 capsule (20 mg total) by mouth daily before breakfast. 03/14/20  Yes Johnson, Clanford L, MD  ondansetron (ZOFRAN-ODT) 8 MG disintegrating tablet Take 8 mg by mouth 3 (three) times daily before meals. For vomiting and nausea 06/07/20  Yes [provider]  polyethylene glycol (MIRALAX / GLYCOLAX) 17 g  packet Take 17 g by mouth daily as needed. 06/01/20  Yes [provider]  potassium chloride SA (KLOR-CON) 20 MEQ tablet Take 20 mEq by mouth 2 (two) times daily.  03/23/20  Yes [provider]  rOPINIRole (REQUIP) 0.25 MG tablet Take 0.25 mg by mouth at bedtime. 04/21/20  Yes [provider]  salicylic acid 17 % gel Apply 1 application topically in the morning and at bedtime. Apply to warts to left hand   Yes [provider]  senna-docusate (SENOKOT-S) 8.6-50 MG tablet Take 1 tablet by mouth at bedtime as needed for mild constipation. 03/14/20  Yes Johnson, Clanford L, MD  terazosin (HYTRIN) 5 MG capsule Take 5 mg by mouth at bedtime.  03/14/20  Yes [provider]  venlafaxine XR (EFFEXOR-XR) 150 MG 24 hr capsule Take 150 mg by mouth daily with breakfast. 06/01/20  Yes [provider]  cloNIDine (CATAPRES) 0.1 MG tablet Take 0.1 mg by mouth once.    [provider]  ipratropium-albuterol (DUONEB) 0.5-2.5 (3) MG/3ML SOLN Take 3 mLs  by nebulization every 6 (six) hours as needed.    [provider]  NON FORMULARY Diet - Mechanical Soft 03/14/20   [provider]  OXYGEN Inhale 2 L into the lungs continuous. 03/14/20   [provider]    Current Facility-Administered Medications  Medication Dose Route Frequency Provider Last Rate Last Admin  . acetaminophen (TYLENOL) tablet 1,000 mg  1,000 mg Oral TID Edwin Dada, MD   1,000 mg at 06/10/20 0813  . alfuzosin (UROXATRAL) 24 hr tablet 10 mg  10 mg Oral Q breakfast Danford, Suann Larry, MD   10 mg at 06/10/20 0846  . aspirin chewable tablet 81 mg  81 mg Oral Daily Edwin Dada, MD   81 mg at 06/10/20 [id number]  . dextrose 5 % in lactated ringers infusion   Intravenous Continuous Edwin Dada, MD 100 mL/hr at 06/10/20 0100 New Bag at 06/10/20 0100  . diltiazem (CARDIZEM CD) 24 hr capsule 360 mg  360 mg Oral Daily Edwin Dada, MD    360 mg at 06/10/20 0813  . enoxaparin (LOVENOX) injection 40 mg  40 mg Subcutaneous Q24H Danford, Suann Larry, MD   40 mg at 06/09/20 2200  . HYDROmorphone (DILAUDID) injection 0.5-1 mg  0.5-1 mg Intravenous Q2H PRN Danford, Suann Larry, MD      . loperamide (IMODIUM) capsule 2 mg  2 mg Oral PRN Danford, Suann Larry, MD      . melatonin tablet 9 mg  9 mg Oral QHS Edwin Dada, MD   9 mg at 06/09/20 2201  . memantine (NAMENDA) tablet 10 mg  10 mg Oral BID Edwin Dada, MD   10 mg at 06/10/20 0814  . metoprolol tartrate (LOPRESSOR) tablet 25 mg  25 mg Oral BID Edwin Dada, MD   25 mg at 06/10/20 0814  . ondansetron (ZOFRAN) tablet 4 mg  4 mg Oral Q6H PRN Danford, Suann Larry, MD       Or  . ondansetron (ZOFRAN) injection 4 mg  4 mg Intravenous Q6H PRN Danford, Suann Larry, MD      . pantoprazole (PROTONIX) EC tablet 40 mg  40 mg Oral Daily Edwin Dada, MD   40 mg at 06/10/20 0814  . rOPINIRole (REQUIP) tablet 0.25 mg  0.25 mg Oral QHS Edwin Dada, MD   0.25 mg at 06/09/20 2159  . venlafaxine XR (EFFEXOR-XR) 24 hr capsule 150 mg  150 mg Oral Q breakfast Edwin Dada, MD   150 mg at 06/10/20 [id number]    Allergies as of 06/09/2020  . (No Known Allergies)    Family History  Problem Relation Age of Onset  . Cancer Other   . Heart attack Mother   . Cancer Brother   . Heart attack Brother   . Heart disease Sister        by pass    Social History   Socioeconomic History  . Marital status: Widowed    Spouse name: Not on file  . Number of children: Not on file  . Years of education: Not on file  . Highest education level: Not on file  Occupational History  . Occupation: retired    Fish farm manager: RETIRED  Tobacco Use  . Smoking status: Never Smoker  . Smokeless tobacco: Never Used  Vaping Use  . Vaping Use: Never used  Substance and Sexual Activity  . Alcohol use: No  . Drug use: No  . Sexual activity: Not on file  Other Topics Concern  . Not on file  Social History Narrative  . Not on file   Social Determinants of Health   Financial Resource Strain: Not on file  Food Insecurity: Not on file  Transportation Needs: Not on file  Physical Activity: Not on file  Stress: Not on file  Social Connections: Not on file  Intimate Partner Violence: Not on file    Review of Systems: General: Negative for anorexia, weight loss, fever, chills, fatigue, weakness. Eyes: Negative for vision changes.  ENT: Negative for hoarseness, difficulty swallowing , nasal congestion. CV: Negative for chest pain, angina, palpitations, dyspnea on exertion, peripheral edema.  Respiratory: Negative for dyspnea at rest, dyspnea on exertion, cough, sputum, wheezing.  GI: See history of present illness. GU:  Negative for dysuria, hematuria, urinary incontinence, urinary frequency, nocturnal urination.  MS: Negative for joint pain, low back pain.  Derm: Negative for rash or itching.  Neuro: Negative for weakness, abnormal sensation, seizure, frequent headaches, memory loss, confusion.  Psych: Negative for anxiety, depression, suicidal ideation, hallucinations.  Endo: Negative for unusual weight change.  Heme: Negative for bruising or bleeding. Allergy: Negative for rash or hives.  Physical Exam: Vital signs in last 24 hours: Temp:  [97.5 F (36.4 C)-98.1 F (36.7 C)] 98.1 F (36.7 C) (01/01 0649) Pulse Rate:  [65-101] 98 (01/01 0811) Resp:  [18-31] 18 (01/01 0649) BP: (99-179)/(46-97) 146/73 (01/01 0811) SpO2:  [90 %-97 %] 93 % (01/01 0811)   General:   Alert,  Well-developed, well-nourished, pleasant and cooperative in NAD Head:  Normocephalic and atraumatic. Eyes:  Sclera clear, no icterus.   Conjunctiva pink. Ears:  Normal auditory acuity. Nose:  No deformity, discharge,  or lesions. Mouth:  No deformity or lesions, dentition normal. Neck:  Supple; no masses or thyromegaly. Lungs:  Clear throughout to  auscultation.   No wheezes, crackles, or rhonchi. No acute distress. Heart:  Regular rate and rhythm; no murmurs, clicks, rubs,  or gallops. Abdomen:  Soft, nontender and nondistended. No masses, hepatosplenomegaly or hernias noted. Normal bowel sounds, without guarding, and without rebound.   Rectal:  Deferred until time of colonoscopy.   Msk:  Symmetrical without gross deformities. Normal posture. Pulses:  Normal pulses noted. Extremities:  Without clubbing or edema. Neurologic:  Alert and  oriented x4;  grossly normal neurologically. Skin:  Intact without significant lesions or rashes. Cervical Nodes:  No significant cervical adenopathy. Psych:  Alert and cooperative. Normal mood and affect.  Intake/Output from previous day: 12/31 0701 - 01/01 0700 In: 2363.3 [I.V.:1363.3; IV Piggyback:1000] Out: -  Intake/Output this shift: No intake/output data recorded.  Lab Results: Recent Labs    06/09/20 1117 06/10/20 0644  WBC 9.6 12.6*  HGB 10.2* 10.5*  HCT 33.5* 34.2*  PLT 413* 417*   BMET Recent Labs    06/09/20 0650 06/09/20 1117 06/10/20 0644  NA 133* 136 137  K 4.7 5.3* 4.4  CL 110 111 111  CO2 19* 20* 21*  GLUCOSE 105* 107* 126*  BUN 53* 52* 39*  CREATININE 1.59* 1.64* 1.20  CALCIUM 8.9 9.3 9.5   LFT Recent Labs    06/09/20 1117 06/10/20 0644  PROT 5.4* 5.2*  ALBUMIN 2.3* 2.2*  AST 11* 9*  ALT 15 14  ALKPHOS 82 85  BILITOT 0.4 0.3   PT/INR Recent Labs    06/09/20 1117  LABPROT 15.2  INR 1.3*   Hepatitis Panel No results for input(s): HEPBSAG, HCVAB, HEPAIGM, HEPBIGM in the last 72 hours. C-Diff  No results for input(s): CDIFFTOX in the last 72 hours.  Studies/Results: US Abdomen Complete  Result Date: 06/09/2020 CLINICAL DATA:  Right upper quadrant pain EXAM: ABDOMEN ULTRASOUND COMPLETE COMPARISON:  Ultrasound 03/12/2020, CT 06/09/2020 FINDINGS: Gallbladder: Gallbladder appears distended. Moderate sludge within the gallbladder lumen. Echodense  foci in the sludge may reflect small stones. Slight increased wall thickness of 3.6 mm. Small amount of pericholecystic fluid. Negative sonographic Murphy. Gallbladder polyp up to 6 mm. Common bile duct: Diameter: 6 mm Liver: Within normal limits for echogenicity. No focal hepatic abnormality. Hepatic cyst on CT not well seen on sonography. Portal vein is patent on color Doppler imaging with normal direction of blood flow towards the liver. IVC: No abnormality visualized. Pancreas: Visualized portion unremarkable. Spleen: Size and appearance within normal limits. Right Kidney: Length: 11 cm. Borderline to mild renal cortical thickening. No hydronephrosis. Multiple cysts in the right kidney, the largest is seen in the lower pole and measures 5.3 cm. Left Kidney: Length: 12 cm. Echogenicity normal. No hydronephrosis. Dominant cyst in the upper pole measuring 6.6 cm. Abdominal aorta: No aneurysm visualized. Other findings: Incidental note made of bilateral pleural effusions. IMPRESSION: 1. Moderate gallbladder sludge with probable punctate stones. Slight increased wall thickness with small amount of fluid adjacent to the gallbladder but negative sonographic Murphy. Findings are suggestive of but not definitive for cholecystitis; consider correlation with nuclear medicine hepatobiliary imaging. 6 mm gallbladder polyp. 2. Simple appearing renal cysts. 3. Incidental note made of pleural effusions. Electronically Signed   By: Donavan Foil M.D.   On: 06/09/2020 16:12   CT Abdomen Pelvis W Contrast  Result Date: 06/09/2020 CLINICAL DATA:  Abdominal distension EXAM: CT ABDOMEN AND PELVIS WITH CONTRAST TECHNIQUE: Multidetector CT imaging of the abdomen and pelvis was performed using the standard protocol following bolus administration of intravenous contrast. CONTRAST:  50mL OMNIPAQUE IOHEXOL 300 MG/ML  SOLN COMPARISON:  06/05/2020 FINDINGS: Lower chest: Small bilateral pleural effusions. Bibasilar atelectasis. Diffuse  coronary artery calcifications. Heart is normal size. Hepatobiliary: Gallbladder is distended. Layering stones and possible sludge within the gallbladder. Slight indistinctness noted around the gallbladder concerning for cholecystitis. Mild periportal edema. No biliary ductal dilatation. Small cyst in the right hepatic lobe 16 mm. Pancreas: No focal abnormality or ductal dilatation. Spleen: No focal abnormality.  Normal size. Adrenals/Urinary Tract: Bilateral renal cysts. Slight fullness of the left renal collecting system, stable since prior study. No stones or overt hydronephrosis. Urinary bladder unremarkable. Stomach/Bowel: Sigmoid and descending colonic diverticulosis. There is wall thickening noted from the distal transverse colon through the mid to distal descending colon concerning for colitis. Slight surrounding inflammation. Remainder of the colon, stomach and small bowel unremarkable. Vascular/Lymphatic: Heavily calcified aorta and iliac vessels. No aneurysm or adenopathy. Reproductive: Mildly prominent prostate. Other: No free fluid or free air. Musculoskeletal: No acute bony abnormality. IMPRESSION: Cholelithiasis. Haziness noted around the gallbladder. Cannot exclude acute cholecystitis. Continued wall thickening in the left colon from the distal transverse colon through the distal descending colon concerning for colitis. Left colonic diverticulosis. Small bilateral pleural effusions, increasing since prior study. Bibasilar atelectasis. Coronary artery disease, aortic atherosclerosis. Electronically Signed   By: Rolm Baptise M.D.   On: 06/09/2020 13:39   DG Abd Acute W/Chest  Result Date: 06/09/2020 CLINICAL DATA:  Abdominal distension. EXAM: DG ABDOMEN ACUTE WITH 1 VIEW CHEST COMPARISON:  None. FINDINGS: No small bowel dilatation is noted. Dilated and air-filled colon is noted suggesting possible ileus. No radiopaque calculi or other significant radiographic abnormality is seen.  Heart size and  mediastinal contours are within normal limits. Both lungs are clear. IMPRESSION: Dilated and air-filled colon is noted suggesting possible ileus. No acute cardiopulmonary disease. Electronically Signed   By: Lupita Raider M.D.   On: 06/09/2020 12:12    Impression: *Left-sided colitis *Diarrhea due to above *Abdominal pain  Plan: Etiology of patient's colitis unclear, given location this is likely ischemic in nature.  Other differential includes infectious, underlying IBD, or other.  Patient now with his second admission, minimal improvement on antibiotics previously.  Discussed in depth with patient and his daughter-in-law today.  We will proceed with flexible sigmoidoscopy with biopsies in the a.m. to further evaluate.  Clear liquids okay today.  Agree with C. difficile testing as well as infectious GI panel.  If we cannot obtain sample by tomorrow morning, I will obtain during flexible sigmoidoscopy.  Surgery following for gallbladder distention.  Further recommendations to follow.  Thank you for letting us take part in this patient's care.  Hennie Duos. Marletta Lor, D.O. Gastroenterology and Hepatology [facility]    LOS: 1 day     06/10/2020, 1:29 PM

## 2020-06-10 NOTE — H&P (View-Only) (Signed)
Consulting  Provider: Loleta Books Primary Care Physician:  Gerlene Fee, NP Primary Gastroenterologist:  Dr. Laural Golden  Reason for Consultation:  Colitis, diarrhea, abdominal pain  HPI:  Jeffrey Sherman is a 87 y.o. male with a past medical history of interstitial lung disease, moderate dementia, recent colitis, peripheral vascular disease, who was admitted to [facility] yesterday evening after presenting with abdominal pain, diarrhea.    Patient with a recent admission to our facility 05/22/2020, but at that time also having hematochezia.  CT during that admission showed left-sided colitis and patient completed a course of antibiotics.  C. difficile and GI panel PCR were both negative.  He did undergo CT abdomen pelvis which showed stenosis of the IMA but patent celiac and SMA.  Patient improved with conservative measures, no endoscopy was performed at that time.  Since discharge, patient was doing better, diarrhea improved, but still having mild abdominal pain.  Pain is all over his abdomen, no radiation.  States it is constant.  Diarrhea then returned and he was started on IV fluids.  Concern for dehydration and worsening abdominal distention at the [facility] so patient was brought back to the ER for evaluation.    Repeat CT abdomen pelvis showed persistent left-sided colitis also noted gallbladder distention and haziness.  Subsequent ultrasound revealed gallbladder sludge with probable punctate stones and slight increased wall thickness as well as fluid adjacent to the gallbladder.    Past Medical History:  Diagnosis Date  . Anxiety   . Cancer [facility])    prostate  . Chronic chest wall pain   . Essential hypertension, benign 06/20/2016  . GAD (generalized anxiety disorder)   . GERD (gastroesophageal reflux disease)   . H/O echocardiogram 2016   normal  . Heart murmur    "leaking heart valve"  . High cholesterol 06/20/2016  . HOH (hard of hearing)    left  . Hx of falling   .  Hyperlipidemia   . Interstitial pulmonary disease ([place])   . MDD (major depressive disorder)   . Murmur, cardiac   . Poor historian   . PUD (peptic ulcer disease)   . PUD (peptic ulcer disease)   . RLS (restless legs syndrome)   . Shortness of breath   . Wrist fracture 12/16/2011    Past Surgical History:  Procedure Laterality Date  . COLONOSCOPY  2005   Rehman: Sigmoid colon diverticulosis, moderate sized external hemorrhoids. A few focal areas of mucosal erythema felt to be nonspecific.  Marland Kitchen ESOPHAGOGASTRODUODENOSCOPY    . ESOPHAGOGASTRODUODENOSCOPY N/A 07/18/2016   Rehman: normal exam except duodenal scar. h.pylori serologies negative  . HIP PINNING,CANNULATED Right 03/10/2020   Procedure: INTERNAL FIXATION RIGHT HIP;  Surgeon: Carole Civil, MD;  Location: [facility];  Service: Orthopedics;  Laterality: Right;  . ORIF WRIST FRACTURE  12/19/2011   Procedure: OPEN REDUCTION INTERNAL FIXATION (ORIF) WRIST FRACTURE;  Surgeon: Carole Civil, MD;  Location: [facility] ORS;  Service: Orthopedics;  Laterality: Right;  . prostate cancer     Diagnosed in the 90s  . TONSILLECTOMY      Prior to Admission medications   Medication Sig Start Date End Date Taking? Authorizing Provider  acetaminophen (TYLENOL) 500 MG tablet Take 1,000 mg by mouth in the morning, at noon, and at bedtime. 03/20/20  Yes [provider]  alfuzosin (UROXATRAL) 10 MG 24 hr tablet Take 10 mg by mouth daily with breakfast.   Yes [provider]  Amino Acids-Protein Hydrolys (PRO-STAT PO) liquid;  15-100 gram-kcal/30 mL; amt: 30 ml; oral Special Instructions: for albumin 2.5 With Meals 03/16/20  Yes [provider]  Ascorbic Acid (VITAMIN C) 1000 MG tablet Take 1,000 mg by mouth daily.   Yes [provider]  aspirin 81 MG chewable tablet Chew 81 mg by mouth daily. 04/15/20  Yes [provider]  Janne Lab Oil [facility]: Lambert Rhonda W) OINT Special Instructions: Apply to bilateral buttocks &  sacral area qshift for blanchable erythema. 03/15/20  Yes [provider]  calcium carbonate (TUMS - DOSED IN MG ELEMENTAL CALCIUM) 500 MG chewable tablet Chew 1 tablet by mouth 2 (two) times daily as needed for indigestion or heartburn.    Yes [provider]  diltiazem (CARDIZEM CD) 360 MG 24 hr capsule Take 360 mg by mouth daily. Start 03/23/2020   Yes [provider]  ferrous sulfate 325 (65 FE) MG tablet Take 325 mg by mouth daily with breakfast. For Anemia and  Iron Deficiency 03/17/20  Yes [provider]  Flax Oil-Fish Oil-Borage Oil (FISH OIL-FLAX OIL-BORAGE OIL) CAPS Take 1 capsule by mouth daily.   Yes [provider]  hydrALAZINE (APRESOLINE) 100 MG tablet Take 100 mg by mouth 3 (three) times daily.   Yes [provider]  lisinopril (ZESTRIL) 40 MG tablet Take 1 tablet (40 mg total) by mouth daily. 03/14/20  Yes Johnson, Clanford L, MD  loperamide (IMODIUM A-D) 2 MG tablet amt: 2mg ; oral Special Instructions: after each stool do not exceed more than 4tabs in 24 hours, As Needed 06/01/20  Yes [provider]  Melatonin 10 MG TABS Take 10 mg by mouth at bedtime.   Yes [provider]  memantine (NAMENDA) 10 MG tablet Take 10 mg by mouth 2 (two) times daily.   Yes [provider]  metoprolol tartrate (LOPRESSOR) 25 MG tablet Take 1 tablet (25 mg total) by mouth 2 (two) times daily. 06/01/20  Yes Johnson, Clanford L, MD  nystatin (MYCOSTATIN) 100000 UNIT/ML suspension Take 5 mLs by mouth 4 (four) times daily. For Oral Thrush 06/06/20 06/13/20 Yes [provider]  omeprazole (PRILOSEC) 20 MG capsule Take 1 capsule (20 mg total) by mouth daily before breakfast. 03/14/20  Yes Johnson, Clanford L, MD  ondansetron (ZOFRAN-ODT) 8 MG disintegrating tablet Take 8 mg by mouth 3 (three) times daily before meals. For vomiting and nausea 06/07/20  Yes [provider]  polyethylene glycol (MIRALAX / GLYCOLAX) 17 g  packet Take 17 g by mouth daily as needed. 06/01/20  Yes [provider]  potassium chloride SA (KLOR-CON) 20 MEQ tablet Take 20 mEq by mouth 2 (two) times daily.  03/23/20  Yes [provider]  rOPINIRole (REQUIP) 0.25 MG tablet Take 0.25 mg by mouth at bedtime. 04/21/20  Yes [provider]  salicylic acid 17 % gel Apply 1 application topically in the morning and at bedtime. Apply to warts to left hand   Yes [provider]  senna-docusate (SENOKOT-S) 8.6-50 MG tablet Take 1 tablet by mouth at bedtime as needed for mild constipation. 03/14/20  Yes Johnson, Clanford L, MD  terazosin (HYTRIN) 5 MG capsule Take 5 mg by mouth at bedtime.  03/14/20  Yes [provider]  venlafaxine XR (EFFEXOR-XR) 150 MG 24 hr capsule Take 150 mg by mouth daily with breakfast. 06/01/20  Yes [provider]  cloNIDine (CATAPRES) 0.1 MG tablet Take 0.1 mg by mouth once.    [provider]  ipratropium-albuterol (DUONEB) 0.5-2.5 (3) MG/3ML SOLN Take 3 mLs  by nebulization every 6 (six) hours as needed.    [provider]  NON FORMULARY Diet - Mechanical Soft 03/14/20   [provider]  OXYGEN Inhale 2 L into the lungs continuous. 03/14/20   [provider]    Current Facility-Administered Medications  Medication Dose Route Frequency Provider Last Rate Last Admin  . acetaminophen (TYLENOL) tablet 1,000 mg  1,000 mg Oral TID Edwin Dada, MD   1,000 mg at 06/10/20 0813  . alfuzosin (UROXATRAL) 24 hr tablet 10 mg  10 mg Oral Q breakfast Danford, Suann Larry, MD   10 mg at 06/10/20 0846  . aspirin chewable tablet 81 mg  81 mg Oral Daily Edwin Dada, MD   81 mg at 06/10/20 [id number]  . dextrose 5 % in lactated ringers infusion   Intravenous Continuous Edwin Dada, MD 100 mL/hr at 06/10/20 0100 New Bag at 06/10/20 0100  . diltiazem (CARDIZEM CD) 24 hr capsule 360 mg  360 mg Oral Daily Edwin Dada, MD    360 mg at 06/10/20 0813  . enoxaparin (LOVENOX) injection 40 mg  40 mg Subcutaneous Q24H Danford, Suann Larry, MD   40 mg at 06/09/20 2200  . HYDROmorphone (DILAUDID) injection 0.5-1 mg  0.5-1 mg Intravenous Q2H PRN Danford, Suann Larry, MD      . loperamide (IMODIUM) capsule 2 mg  2 mg Oral PRN Danford, Suann Larry, MD      . melatonin tablet 9 mg  9 mg Oral QHS Edwin Dada, MD   9 mg at 06/09/20 2201  . memantine (NAMENDA) tablet 10 mg  10 mg Oral BID Edwin Dada, MD   10 mg at 06/10/20 0814  . metoprolol tartrate (LOPRESSOR) tablet 25 mg  25 mg Oral BID Edwin Dada, MD   25 mg at 06/10/20 0814  . ondansetron (ZOFRAN) tablet 4 mg  4 mg Oral Q6H PRN Danford, Suann Larry, MD       Or  . ondansetron (ZOFRAN) injection 4 mg  4 mg Intravenous Q6H PRN Danford, Suann Larry, MD      . pantoprazole (PROTONIX) EC tablet 40 mg  40 mg Oral Daily Edwin Dada, MD   40 mg at 06/10/20 0814  . rOPINIRole (REQUIP) tablet 0.25 mg  0.25 mg Oral QHS Edwin Dada, MD   0.25 mg at 06/09/20 2159  . venlafaxine XR (EFFEXOR-XR) 24 hr capsule 150 mg  150 mg Oral Q breakfast Edwin Dada, MD   150 mg at 06/10/20 [id number]    Allergies as of 06/09/2020  . (No Known Allergies)    Family History  Problem Relation Age of Onset  . Cancer Other   . Heart attack Mother   . Cancer Brother   . Heart attack Brother   . Heart disease Sister        by pass    Social History   Socioeconomic History  . Marital status: Widowed    Spouse name: Not on file  . Number of children: Not on file  . Years of education: Not on file  . Highest education level: Not on file  Occupational History  . Occupation: retired    Fish farm manager: RETIRED  Tobacco Use  . Smoking status: Never Smoker  . Smokeless tobacco: Never Used  Vaping Use  . Vaping Use: Never used  Substance and Sexual Activity  . Alcohol use: No  . Drug use: No  . Sexual activity: Not on file  Other Topics Concern  . Not on file  Social History Narrative  . Not on file   Social Determinants of Health   Financial Resource Strain: Not on file  Food Insecurity: Not on file  Transportation Needs: Not on file  Physical Activity: Not on file  Stress: Not on file  Social Connections: Not on file  Intimate Partner Violence: Not on file    Review of Systems: General: Negative for anorexia, weight loss, fever, chills, fatigue, weakness. Eyes: Negative for vision changes.  ENT: Negative for hoarseness, difficulty swallowing , nasal congestion. CV: Negative for chest pain, angina, palpitations, dyspnea on exertion, peripheral edema.  Respiratory: Negative for dyspnea at rest, dyspnea on exertion, cough, sputum, wheezing.  GI: See history of present illness. GU:  Negative for dysuria, hematuria, urinary incontinence, urinary frequency, nocturnal urination.  MS: Negative for joint pain, low back pain.  Derm: Negative for rash or itching.  Neuro: Negative for weakness, abnormal sensation, seizure, frequent headaches, memory loss, confusion.  Psych: Negative for anxiety, depression, suicidal ideation, hallucinations.  Endo: Negative for unusual weight change.  Heme: Negative for bruising or bleeding. Allergy: Negative for rash or hives.  Physical Exam: Vital signs in last 24 hours: Temp:  [97.5 F (36.4 C)-98.1 F (36.7 C)] 98.1 F (36.7 C) (01/01 0649) Pulse Rate:  [65-101] 98 (01/01 0811) Resp:  [18-31] 18 (01/01 0649) BP: (99-179)/(46-97) 146/73 (01/01 0811) SpO2:  [90 %-97 %] 93 % (01/01 0811)   General:   Alert,  Well-developed, well-nourished, pleasant and cooperative in NAD Head:  Normocephalic and atraumatic. Eyes:  Sclera clear, no icterus.   Conjunctiva pink. Ears:  Normal auditory acuity. Nose:  No deformity, discharge,  or lesions. Mouth:  No deformity or lesions, dentition normal. Neck:  Supple; no masses or thyromegaly. Lungs:  Clear throughout to  auscultation.   No wheezes, crackles, or rhonchi. No acute distress. Heart:  Regular rate and rhythm; no murmurs, clicks, rubs,  or gallops. Abdomen:  Soft, nontender and nondistended. No masses, hepatosplenomegaly or hernias noted. Normal bowel sounds, without guarding, and without rebound.   Rectal:  Deferred until time of colonoscopy.   Msk:  Symmetrical without gross deformities. Normal posture. Pulses:  Normal pulses noted. Extremities:  Without clubbing or edema. Neurologic:  Alert and  oriented x4;  grossly normal neurologically. Skin:  Intact without significant lesions or rashes. Cervical Nodes:  No significant cervical adenopathy. Psych:  Alert and cooperative. Normal mood and affect.  Intake/Output from previous day: 12/31 0701 - 01/01 0700 In: 2363.3 [I.V.:1363.3; IV Piggyback:1000] Out: -  Intake/Output this shift: No intake/output data recorded.  Lab Results: Recent Labs    06/09/20 1117 06/10/20 0644  WBC 9.6 12.6*  HGB 10.2* 10.5*  HCT 33.5* 34.2*  PLT 413* 417*   BMET Recent Labs    06/09/20 0650 06/09/20 1117 06/10/20 0644  NA 133* 136 137  K 4.7 5.3* 4.4  CL 110 111 111  CO2 19* 20* 21*  GLUCOSE 105* 107* 126*  BUN 53* 52* 39*  CREATININE 1.59* 1.64* 1.20  CALCIUM 8.9 9.3 9.5   LFT Recent Labs    06/09/20 1117 06/10/20 0644  PROT 5.4* 5.2*  ALBUMIN 2.3* 2.2*  AST 11* 9*  ALT 15 14  ALKPHOS 82 85  BILITOT 0.4 0.3   PT/INR Recent Labs    06/09/20 1117  LABPROT 15.2  INR 1.3*   Hepatitis Panel No results for input(s): HEPBSAG, HCVAB, HEPAIGM, HEPBIGM in the last 72 hours. C-Diff  No results for input(s): CDIFFTOX in the last 72 hours.  Studies/Results: US Abdomen Complete  Result Date: 06/09/2020 CLINICAL DATA:  Right upper quadrant pain EXAM: ABDOMEN ULTRASOUND COMPLETE COMPARISON:  Ultrasound 03/12/2020, CT 06/09/2020 FINDINGS: Gallbladder: Gallbladder appears distended. Moderate sludge within the gallbladder lumen. Echodense  foci in the sludge may reflect small stones. Slight increased wall thickness of 3.6 mm. Small amount of pericholecystic fluid. Negative sonographic Murphy. Gallbladder polyp up to 6 mm. Common bile duct: Diameter: 6 mm Liver: Within normal limits for echogenicity. No focal hepatic abnormality. Hepatic cyst on CT not well seen on sonography. Portal vein is patent on color Doppler imaging with normal direction of blood flow towards the liver. IVC: No abnormality visualized. Pancreas: Visualized portion unremarkable. Spleen: Size and appearance within normal limits. Right Kidney: Length: 11 cm. Borderline to mild renal cortical thickening. No hydronephrosis. Multiple cysts in the right kidney, the largest is seen in the lower pole and measures 5.3 cm. Left Kidney: Length: 12 cm. Echogenicity normal. No hydronephrosis. Dominant cyst in the upper pole measuring 6.6 cm. Abdominal aorta: No aneurysm visualized. Other findings: Incidental note made of bilateral pleural effusions. IMPRESSION: 1. Moderate gallbladder sludge with probable punctate stones. Slight increased wall thickness with small amount of fluid adjacent to the gallbladder but negative sonographic Murphy. Findings are suggestive of but not definitive for cholecystitis; consider correlation with nuclear medicine hepatobiliary imaging. 6 mm gallbladder polyp. 2. Simple appearing renal cysts. 3. Incidental note made of pleural effusions. Electronically Signed   By: Donavan Foil M.D.   On: 06/09/2020 16:12   CT Abdomen Pelvis W Contrast  Result Date: 06/09/2020 CLINICAL DATA:  Abdominal distension EXAM: CT ABDOMEN AND PELVIS WITH CONTRAST TECHNIQUE: Multidetector CT imaging of the abdomen and pelvis was performed using the standard protocol following bolus administration of intravenous contrast. CONTRAST:  69mL OMNIPAQUE IOHEXOL 300 MG/ML  SOLN COMPARISON:  06/05/2020 FINDINGS: Lower chest: Small bilateral pleural effusions. Bibasilar atelectasis. Diffuse  coronary artery calcifications. Heart is normal size. Hepatobiliary: Gallbladder is distended. Layering stones and possible sludge within the gallbladder. Slight indistinctness noted around the gallbladder concerning for cholecystitis. Mild periportal edema. No biliary ductal dilatation. Small cyst in the right hepatic lobe 16 mm. Pancreas: No focal abnormality or ductal dilatation. Spleen: No focal abnormality.  Normal size. Adrenals/Urinary Tract: Bilateral renal cysts. Slight fullness of the left renal collecting system, stable since prior study. No stones or overt hydronephrosis. Urinary bladder unremarkable. Stomach/Bowel: Sigmoid and descending colonic diverticulosis. There is wall thickening noted from the distal transverse colon through the mid to distal descending colon concerning for colitis. Slight surrounding inflammation. Remainder of the colon, stomach and small bowel unremarkable. Vascular/Lymphatic: Heavily calcified aorta and iliac vessels. No aneurysm or adenopathy. Reproductive: Mildly prominent prostate. Other: No free fluid or free air. Musculoskeletal: No acute bony abnormality. IMPRESSION: Cholelithiasis. Haziness noted around the gallbladder. Cannot exclude acute cholecystitis. Continued wall thickening in the left colon from the distal transverse colon through the distal descending colon concerning for colitis. Left colonic diverticulosis. Small bilateral pleural effusions, increasing since prior study. Bibasilar atelectasis. Coronary artery disease, aortic atherosclerosis. Electronically Signed   By: Rolm Baptise M.D.   On: 06/09/2020 13:39   DG Abd Acute W/Chest  Result Date: 06/09/2020 CLINICAL DATA:  Abdominal distension. EXAM: DG ABDOMEN ACUTE WITH 1 VIEW CHEST COMPARISON:  None. FINDINGS: No small bowel dilatation is noted. Dilated and air-filled colon is noted suggesting possible ileus. No radiopaque calculi or other significant radiographic abnormality is seen.  Heart size and  mediastinal contours are within normal limits. Both lungs are clear. IMPRESSION: Dilated and air-filled colon is noted suggesting possible ileus. No acute cardiopulmonary disease. Electronically Signed   By: Lupita Raider M.D.   On: 06/09/2020 12:12    Impression: *Left-sided colitis *Diarrhea due to above *Abdominal pain  Plan: Etiology of patient's colitis unclear, given location this is likely ischemic in nature.  Other differential includes infectious, underlying IBD, or other.  Patient now with his second admission, minimal improvement on antibiotics previously.  Discussed in depth with patient and his daughter-in-law today.  We will proceed with flexible sigmoidoscopy with biopsies in the a.m. to further evaluate.  Clear liquids okay today.  Agree with C. difficile testing as well as infectious GI panel.  If we cannot obtain sample by tomorrow morning, I will obtain during flexible sigmoidoscopy.  Surgery following for gallbladder distention.  Further recommendations to follow.  Thank you for letting us take part in this patient's care.  Hennie Duos. Marletta Lor, D.O. Gastroenterology and Hepatology [facility]    LOS: 1 day     06/10/2020, 1:29 PM

## 2020-06-11 ENCOUNTER — Encounter (HOSPITAL_COMMUNITY)
Admission: EM | Disposition: A | Discharge status: Skilled Nursing Facility | Payer: Self-pay | Source: Skilled Nursing Facility | Attending: Family Medicine

## 2020-06-11 ENCOUNTER — Encounter (HOSPITAL_COMMUNITY): Payer: Self-pay | Admitting: Family Medicine

## 2020-06-11 ENCOUNTER — Inpatient Hospital Stay (HOSPITAL_COMMUNITY): Payer: Medicare Other | Admitting: Anesthesiology

## 2020-06-11 DIAGNOSIS — K559 Vascular disorder of intestine, unspecified: Secondary | ICD-10-CM | POA: Diagnosis not present

## 2020-06-11 DIAGNOSIS — R197 Diarrhea, unspecified: Secondary | ICD-10-CM

## 2020-06-11 HISTORY — PX: FLEXIBLE SIGMOIDOSCOPY: SHX5431

## 2020-06-11 LAB — BASIC METABOLIC PANEL
Anion gap: 3 — ABNORMAL LOW (ref 5–15)
BUN: 33 mg/dL — ABNORMAL HIGH (ref 8–23)
CO2: 22 mmol/L (ref 22–32)
Calcium: 9.2 mg/dL (ref 8.9–10.3)
Chloride: 111 mmol/L (ref 98–111)
Creatinine, Ser: 1.08 mg/dL (ref 0.61–1.24)
GFR, Estimated: >60 mL/min (ref >60)
Glucose, Bld: 115 mg/dL — ABNORMAL HIGH (ref 70–99)
Potassium: 4.1 mmol/L (ref 3.5–5.1)
Sodium: 136 mmol/L (ref 135–145)

## 2020-06-11 LAB — C DIFFICILE QUICK SCREEN W PCR REFLEX
C Diff antigen: NEGATIVE
C Diff interpretation: NOT DETECTED
C Diff toxin: NEGATIVE

## 2020-06-11 LAB — CBC
HCT: 33.2 % — ABNORMAL LOW (ref 39.0–52.0)
Hemoglobin: 10.2 g/dL — ABNORMAL LOW (ref 13.0–17.0)
MCH: 25.2 pg — ABNORMAL LOW (ref 26.0–34.0)
MCHC: 30.7 g/dL (ref 30.0–36.0)
MCV: 82 fL (ref 80.0–100.0)
Platelets: 372 10*3/uL (ref 150–400)
RBC: 4.05 MIL/uL — ABNORMAL LOW (ref 4.22–5.81)
RDW: 15.9 % — ABNORMAL HIGH (ref 11.5–15.5)
WBC: 9.5 10*3/uL (ref 4.0–10.5)
nRBC: 0 % (ref 0.0–0.2)

## 2020-06-11 SURGERY — SIGMOIDOSCOPY, FLEXIBLE
Anesthesia: General

## 2020-06-11 MED ORDER — SODIUM CHLORIDE 0.9 % IV SOLN
2.0000 g | INTRAVENOUS | Status: DC
  Administered 2020-06-11: 2 g via INTRAVENOUS
  Filled 2020-06-11: qty 20

## 2020-06-11 MED ORDER — PROPOFOL 10 MG/ML IV BOLUS
INTRAVENOUS | Status: DC | PRN
  Administered 2020-06-11: 110 mg via INTRAVENOUS

## 2020-06-11 MED ORDER — LACTATED RINGERS IV SOLN: INTRAVENOUS | Status: DC | PRN

## 2020-06-11 MED ORDER — PROPOFOL 10 MG/ML IV BOLUS
INTRAVENOUS | Status: AC
  Filled 2020-06-11: qty 20

## 2020-06-11 MED ORDER — SODIUM CHLORIDE 0.9 % IV SOLN: INTRAVENOUS | Status: DC

## 2020-06-11 MED ORDER — LACTATED RINGERS IV SOLN: INTRAVENOUS | Status: DC

## 2020-06-11 NOTE — Anesthesia Preprocedure Evaluation (Signed)
Anesthesia Evaluation  Patient identified by MRN, date of birth, ID band Patient awake    Reviewed: Allergy & Precautions, H&P , NPO status , Patient's Chart, lab work & pertinent test results, reviewed documented beta blocker date and time   Airway Mallampati: II  TM Distance: >3 FB Neck ROM: full    Dental no notable dental hx.    Pulmonary shortness of breath,    Pulmonary exam normal breath sounds clear to auscultation       Cardiovascular Exercise Tolerance: Good hypertension, negative cardio ROS   Rhythm:regular Rate:Normal     Neuro/Psych PSYCHIATRIC DISORDERS Anxiety Depression Dementia negative neurological ROS     GI/Hepatic Neg liver ROS, PUD, GERD  Medicated,  Endo/Other  negative endocrine ROS  Renal/GU Renal disease  negative genitourinary   Musculoskeletal   Abdominal   Peds  Hematology  (+) Blood dyscrasia, anemia ,   Anesthesia Other Findings   Reproductive/Obstetrics negative OB ROS                             Anesthesia Physical Anesthesia Plan  ASA: III and emergent  Anesthesia Plan: General   Post-op Pain Management:    Induction:   PONV Risk Score and Plan: Propofol infusion  Airway Management Planned:   Additional Equipment:   Intra-op Plan:   Post-operative Plan:   Informed Consent: I have reviewed the patients History and Physical, chart, labs and discussed the procedure including the risks, benefits and alternatives for the proposed anesthesia with the patient or authorized representative who has indicated his/her understanding and acceptance.     Dental Advisory Given  Plan Discussed with: CRNA  Anesthesia Plan Comments:         Anesthesia Quick Evaluation

## 2020-06-11 NOTE — Anesthesia Postprocedure Evaluation (Signed)
Anesthesia Post Note  Patient: Jeffrey Sherman  Procedure(s) Performed: FLEXIBLE SIGMOIDOSCOPY (N/A )  Patient location during evaluation: PACU Anesthesia Type: General Level of consciousness: awake Pain management: pain level controlled Vital Signs Assessment: post-procedure vital signs reviewed and stable Respiratory status: spontaneous breathing Cardiovascular status: blood pressure returned to baseline Anesthetic complications: no   No complications documented.   Last Vitals:  Vitals:   06/10/20 2206 06/11/20 0530  BP: (!) 144/66 (!) 157/73  Pulse: 75 88  Resp: 18 16  Temp: 36.7 C 36.7 C  SpO2: 92% 95%    Last Pain:  Vitals:   06/11/20 0530  TempSrc: Oral  PainSc:                  Windell Norfolk

## 2020-06-11 NOTE — Op Note (Signed)
[facility] Patient Name: Jeffrey Sherman Procedure Date: 06/11/2020 7:49 AM MRN: [id number] Date of Birth: 12-22-1929 Attending MD: Elon Alas. Abbey Chatters DO CSN: [id number] Age: 87 Admit Type: Inpatient Procedure:                Flexible Sigmoidoscopy Indications:              Diarrhea, Abnormal CT of the GI tract Providers:                Elon Alas. Abbey Chatters, DO, Janeece Riggers, RN, Glendora Page Referring MD:              Medicines:                See the Anesthesia note for documentation of the                            administered medications Complications:            No immediate complications. Estimated Blood Loss:     Estimated blood loss was minimal. Procedure:                Pre-Anesthesia Assessment:                           - The anesthesia plan was to use monitored                            anesthesia care (MAC).                           After obtaining informed consent, the scope was                            passed under direct vision. The PCF-HQ190L                            ([id number]) scope was introduced through the anus and                            advanced to the the descending colon. The flexible                            sigmoidoscopy was accomplished without difficulty.                            The patient tolerated the procedure well. The                            quality of the bowel preparation was poor. Scope In: 8:13:38 AM Scope Out: 8:21:10 AM Total Procedure Duration: 0 hours 7 minutes 32 seconds  Findings:      The perianal and digital rectal examinations were normal.      Non-bleeding internal hemorrhoids were found during endoscopy.      Many small and large-mouthed diverticula were found in the sigmoid colon       and descending colon.      A large amount of stool was found in the sigmoid colon and in the  descending colon, making visualization difficult.      There is no endoscopic evidence of inflammation in the recto-sigmoid        colon, in the sigmoid colon and in the descending colon.      Biopsies were taken with a cold forceps in the sigmoid colon and in the       descending colon for histology. Impression:               - Preparation of the colon was poor.                           - Non-bleeding internal hemorrhoids.                           - Diverticulosis in the sigmoid colon and in the                            descending colon.                           - Stool in the sigmoid colon and in the descending                            colon.                           - Biopsies were taken with a cold forceps for                            histology in the sigmoid colon and in the                            descending colon.                           - Stool sample collected to rule out infectious                            etiology of patient's diarrhea. Moderate Sedation:      Per Anesthesia Care Recommendation:           - Return patient to hospital ward for ongoing care.                           - Advance diet as tolerated.                           - No active inflammation see in sigmoid or                            descending colon up to 60 cm. However, bowel prep                            was not ideal so exam was incomplete. I did take  biopsies of the sigmoid and descending colons.                            Stool sample was also collected to rule out                            infectious etiology of patient's diarrhea. Procedure Code(s):        --- Professional ---                           [phone], Sigmoidoscopy, flexible; with biopsy, single                            or multiple Diagnosis Code(s):        --- Professional ---                           K64.8, Other hemorrhoids                           R19.7, Diarrhea, unspecified                           K57.30, Diverticulosis of large intestine without                            perforation or abscess without  bleeding                           R93.3, Abnormal findings on diagnostic imaging of                            other parts of digestive tract CPT copyright 2019 American Medical Association. All rights reserved. The codes documented in this report are preliminary and upon coder review may  be revised to meet current compliance requirements. Elon Alas. Abbey Chatters, DO [place] Abbey Chatters, DO 06/11/2020 8:27:42 AM This report has been signed electronically. Number of Addenda: 0

## 2020-06-11 NOTE — Transfer of Care (Signed)
Immediate Anesthesia Transfer of Care Note  Patient: Jeffrey Sherman  Procedure(s) Performed: FLEXIBLE SIGMOIDOSCOPY (N/A )  Patient Location: PACU  Anesthesia Type:General  Level of Consciousness: drowsy  Airway & Oxygen Therapy: Patient Spontanous Breathing and Patient connected to nasal cannula oxygen  Post-op Assessment: Report given to RN and Post -op Vital signs reviewed and stable  Post vital signs: Reviewed and stable  Last Vitals:  Vitals Value Taken Time  BP    Temp    Pulse    Resp    SpO2      Last Pain:  Vitals:   06/11/20 0530  TempSrc: Oral  PainSc:          Complications: No complications documented.

## 2020-06-11 NOTE — Interval H&P Note (Signed)
History and Physical Interval Note:  06/11/2020 7:25 AM  Jeffrey Sherman  has presented today for surgery, with the diagnosis of Colitis, diarrhea.  The various methods of treatment have been discussed with the patient and family. After consideration of risks, benefits and other options for treatment, the patient has consented to  Procedure(s): FLEXIBLE SIGMOIDOSCOPY (N/A) as a surgical intervention.  The patient's history has been reviewed, patient examined, no change in status, stable for surgery.  I have reviewed the patient's chart and labs.  Questions were answered to the patient's satisfaction.     Lanelle Bal

## 2020-06-11 NOTE — Progress Notes (Signed)
[facility] PROGRESS NOTE    Jeffrey Sherman  FKC:[id number] DOB: February 01, 1930 DOA: 06/09/2020 PCP: Sharee Holster, NP      Brief Narrative:  Jeffrey Sherman is a 87 y.o. M with hx ILD on home O2, moderate dementia, wheelchair bound last 6 months, lives at [facility], prostate cancer remote, HTN and PVD as well as recent admission for colitis, presumed ischemic versus infectious who presents with progressive abdominal pain, nausea, diarrhea and abdominal distension.  Patient was admitted with identical symptoms plus hematochezia earlier this month for 10 days. CT showed colitis, started on antibiotics.  GI consulted. Management was supportive.  Cdiff and GI PCR panel negative.  Imprved.  CTA abdomen showed tandem stenosis of IMA but patent celiac and SMA.  WBC, hematochezia, pain and diarrhea had some improvement and patient was discharged.  Since discharge 7 days ago, he was able to tolerate PO intake but still had nausea, abdominal pain.  This progressively got worse and so he was started on IV fluids, but had progressively worse abdominal pain, all over, frequent loose stools and nausea and inability to eat.  Creatinine started to worsen, and so patient was sent to hospital for GI and Gen Surg consultation.  In the ER, CT abdomen showed persistent left sided colitis but also GB distension and haziness.  Creatinine 1.6.  Afebrile, and without leukocytosis.          Assessment & Plan:   Colitis Right upper quadrant pain Differential still includes infectious, ischemic, and inflammatory colitis.  Acalculous cholecystitis considered.  Flex sig today appeared to show a relatively normal colon with large volume stool.  Cdiff negative.    -Start ceftriaxone for possible acalculous cholecystitis -Obtain HIDA scan -> if abnormal, will call back to Gen Surg  -Trial clears (NPO in am for HIda, then resume clears and ADAT after)  -Continue aspirin -Consult GI Dr.  Marletta Lor, appreciate cares     Acute kidney injury Hyperkalemia Creatinine 1.6 on admission, resolved to 1 today, normal.     -Hold lisinopril  Interstitial lung disease Chronic hypoxic respiratory failure Not on disease specific therapy.   -Continue home O2  Hypertension BP elevated -Continue aspirin, diltiazem, metoprolol -Continue hydralazine (at lower than home dose) -Hold lisinopril  Dementia Mild acute metabolic encephalopathy due to colitis  -Continue venlafaxine -Dementia precautions  RLS -Continue  Requip  History prostate cancer  -Continue alfuzosin  Hx PUD - COntinue  PPI  Moderate protein calorie malnutrition As evidenced by albumin 2.2, moderate diffuse loss of subcutaneous muscle mass and fat, poor p.o. intake over the last month due to colitis        Disposition: Status is: Inpatient  Remains inpatient appropriate because:IV treatments appropriate due to intensity of illness or inability to take PO   Dispo: The patient is from: SNF              Anticipated d/c is to: SNF              Anticipated d/c date is: > 3 days              Patient currently is not medically stable to d/c.     Patient is admitted for colitis and abdominal pain.  Not an ileus, as he has frequent loose stools. Flex sig today did not show extensive colitis but exam was very limited.  Obtain HIDA scan, follow clinical course.  Prognosis appears to be worsening.  MDM: The below labs and imaging reports were reviewed and summarized above.  Medication management as above.   DVT prophylaxis: enoxaparin (LOVENOX) injection 40 mg Start: 06/09/20 2200  Code Status: DNR Family Communication: Daughter in Sports coach at the bedside           Subjective: Confused at times.  Abdominal pain still present, severe.  No vomiting.  A few BM overnight.  No hematochezia.  No dyspnea.      Objective: Vitals:   06/11/20 0915 06/11/20 1018 06/11/20 1333  06/11/20 1541  BP: (!) 162/75 (!) 182/84 (!) 150/70 (!) 147/63  Pulse: 88 90 73 71  Resp: (!) 25   20  Temp:   98.6 F (37 C) 98.1 F (36.7 C)  TempSrc:    Oral  SpO2: 96%   92%  Weight:      Height:        Intake/Output Summary (Last 24 hours) at 06/11/2020 1812 Last data filed at 06/11/2020 1613 Gross per 24 hour  Intake 177.92 ml  Output -  Net 177.92 ml   Filed Weights   06/09/20 1105  Weight: 84.4 kg    Examination: General appearance:  adult male, alert and in moderate distress from pain, lying in bed with eyes closed HEENT:   Skin:   Cardiac: RRR, nl S1-S2, no murmurs appreciated.  Lower extremity edema Respiratory: Respirations shallow, normal rate, do not appreciate rales or wheezing on exma. Abdomen: Abdomen soft.  Diffuse TTP with worse on Right.  Distended, bowel sounds reduced.     MSK: No deformities or effusions.  Diffuse loss of subcutaneous muscle mass and fat Neuro: Awake and alert but somewhat confused.  EOMI, moves all extremities with severe generalized weakness. Speech fluent.    Psych: Sensorium intact and responding to questions, attention diminished, confused to place.  Judgment and insight appear impaired.    Data Reviewed: I have personally reviewed following labs and imaging studies:  CBC: Recent Labs  Lab 06/05/20 0710 06/07/20 0627 06/09/20 1117 06/10/20 0644 06/11/20 0710  WBC 13.6* 11.9* 9.6 12.6* 9.5  NEUTROABS  --  8.9* 7.8*  --   --   HGB 10.2* 9.6* 10.2* 10.5* 10.2*  HCT 32.9* 29.8* 33.5* 34.2* 33.2*  MCV 83.5 82.8 83.8 83.4 82.0  PLT 266 318 413* 417* [id number]   Basic Metabolic Panel: Recent Labs  Lab 06/08/20 0330 06/09/20 0650 06/09/20 1117 06/10/20 0644 06/11/20 0710  NA 135 133* 136 137 136  K 4.3 4.7 5.3* 4.4 4.1  CL 110 110 111 111 111  CO2 20* 19* 20* 21* 22  GLUCOSE 107* 105* 107* 126* 115*  BUN 57* 53* 52* 39* 33*  CREATININE 1.81* 1.59* 1.64* 1.20 1.08  CALCIUM 8.9 8.9 9.3 9.5 9.2   GFR: Estimated  Creatinine Clearance: 47.3 mL/min (by C-G formula based on SCr of 1.08 mg/dL). Liver Function Tests: Recent Labs  Lab 06/07/20 0627 06/09/20 1117 06/10/20 0644  AST 9* 11* 9*  ALT 11 15 14   ALKPHOS 68 82 85  BILITOT 0.2* 0.4 0.3  PROT 4.9* 5.4* 5.2*  ALBUMIN 2.1* 2.3* 2.2*   Recent Labs  Lab 06/09/20 1117  LIPASE 27   No results for input(s): AMMONIA in the last 168 hours. Coagulation Profile: Recent Labs  Lab 06/09/20 1117  INR 1.3*   Cardiac Enzymes: No results for input(s): CKTOTAL, CKMB, CKMBINDEX, TROPONINI in the last 168 hours. BNP (last 3 results) No results for input(s): PROBNP in the last 8760 hours. HbA1C:  No results for input(s): HGBA1C in the last 72 hours. CBG: No results for input(s): GLUCAP in the last 168 hours. Lipid Profile: No results for input(s): CHOL, HDL, LDLCALC, TRIG, CHOLHDL, LDLDIRECT in the last 72 hours. Thyroid Function Tests: No results for input(s): TSH, T4TOTAL, FREET4, T3FREE, THYROIDAB in the last 72 hours. Anemia Panel: No results for input(s): VITAMINB12, FOLATE, FERRITIN, TIBC, IRON, RETICCTPCT in the last 72 hours. Urine analysis:    Component Value Date/Time   COLORURINE YELLOW 05/22/2020 1111   APPEARANCEUR CLEAR 05/22/2020 1111   APPEARANCEUR Clear 02/15/2020 1321   LABSPEC 1.016 05/22/2020 1111   PHURINE 5.0 05/22/2020 1111   GLUCOSEU 150 (A) 05/22/2020 1111   HGBUR NEGATIVE 05/22/2020 1111   BILIRUBINUR NEGATIVE 05/22/2020 1111   BILIRUBINUR Negative 02/15/2020 1321   KETONESUR NEGATIVE 05/22/2020 1111   PROTEINUR >=300 (A) 05/22/2020 1111   UROBILINOGEN negative (A) 10/19/2019 0958   UROBILINOGEN 0.2 05/08/2014 2045   NITRITE NEGATIVE 05/22/2020 1111   LEUKOCYTESUR NEGATIVE 05/22/2020 1111   Sepsis Labs: @LABRCNTIP (procalcitonin:4,lacticacidven:4)  ) Recent Results (from the past 240 hour(s))  C Difficile Quick Screen w PCR reflex     Status: None   Collection Time: 06/09/20 11:20 AM   Specimen: STOOL   Result Value Ref Range Status   C Diff antigen NEGATIVE NEGATIVE Final   C Diff toxin NEGATIVE NEGATIVE Final   C Diff interpretation No C. difficile detected.  Final    Comment: Performed at [facility], [address] [place], [place] [address]  Resp Panel by RT-PCR (Flu A&B, Covid) Nasopharyngeal Swab     Status: None   Collection Time: 06/09/20 12:25 PM   Specimen: Nasopharyngeal Swab; Nasopharyngeal(NP) swabs in vial transport medium  Result Value Ref Range Status   SARS Coronavirus 2 by RT PCR NEGATIVE NEGATIVE Final    Comment: (NOTE) SARS-CoV-2 target nucleic acids are NOT DETECTED.  The SARS-CoV-2 RNA is generally detectable in upper respiratory specimens during the acute phase of infection. The lowest concentration of SARS-CoV-2 viral copies this assay can detect is 138 copies/mL. A negative result does not preclude SARS-Cov-2 infection and should not be used as the sole basis for treatment or other patient management decisions. A negative result may occur with  improper specimen collection/handling, submission of specimen other than nasopharyngeal swab, presence of viral mutation(s) within the areas targeted by this assay, and inadequate number of viral copies(<138 copies/mL). A negative result must be combined with clinical observations, patient history, and epidemiological information. The expected result is Negative.  Fact Sheet for Patients:  EntrepreneurPulse.com.au  Fact Sheet for Healthcare Providers:  IncredibleEmployment.be  This test is no t yet approved or cleared by the [place] FDA and  has been authorized for detection and/or diagnosis of SARS-CoV-2 by FDA under an Emergency Use Authorization (EUA). This EUA will remain  in effect (meaning this test can be used) for the duration of the COVID-19 declaration under Section 564(b)(1) of the Act, 21 U.S.C.section 360bbb-3(b)(1), unless the authorization is terminated   or revoked sooner.       Influenza A by PCR NEGATIVE NEGATIVE Final   Influenza B by PCR NEGATIVE NEGATIVE Final    Comment: (NOTE) The Xpert Xpress SARS-CoV-2/FLU/RSV plus assay is intended as an aid in the diagnosis of influenza from Nasopharyngeal swab specimens and should not be used as a sole basis for treatment. Nasal washings and aspirates are unacceptable for Xpert Xpress SARS-CoV-2/FLU/RSV testing.  Fact Sheet for Patients: EntrepreneurPulse.com.au  Fact Sheet for  Healthcare Providers: IncredibleEmployment.be  This test is not yet approved or cleared by the [place] and has been authorized for detection and/or diagnosis of SARS-CoV-2 by FDA under an Emergency Use Authorization (EUA). This EUA will remain in effect (meaning this test can be used) for the duration of the COVID-19 declaration under Section 564(b)(1) of the Act, 21 U.S.C. section 360bbb-3(b)(1), unless the authorization is terminated or revoked.  Performed at [facility], [address] [place], [place] [address]          Radiology Studies: No results found.      Scheduled Meds: . acetaminophen  1,000 mg Oral TID  . alfuzosin  10 mg Oral Q breakfast  . aspirin  81 mg Oral Daily  . diltiazem  360 mg Oral Daily  . enoxaparin (LOVENOX) injection  40 mg Subcutaneous Q24H  . hydrALAZINE  10 mg Oral Q8H  . melatonin  9 mg Oral QHS  . memantine  10 mg Oral BID  . metoprolol tartrate  25 mg Oral BID  . pantoprazole  40 mg Oral Daily  . rOPINIRole  0.25 mg Oral QHS  . venlafaxine XR  150 mg Oral Q breakfast   Continuous Infusions: . cefTRIAXone (ROCEPHIN)  IV 2 g (06/11/20 1549)  . dextrose 5% lactated ringers 100 mL/hr at 06/10/20 0100     LOS: 2 days    Time spent: 25 minutes   Edwin Dada, MD [facility] 06/11/2020, 6:12 PM     Please page though [place] or Epic secure chat:  For [employer], Adult nurse

## 2020-06-11 NOTE — Progress Notes (Signed)
Just finished performing flexible sigmoidoscopy on this patient. Bowel prep was not ideal so exam was somewhat incomplete. That being said, I did not see evidence of active inflammation in patient's sigmoid or descending colon's. He does have extensive diverticulosis.   I took random biopsies of his sigmoid and descending colon and we will await these results. I also take a stool sample to rule out infectious etiology of patient's diarrhea.  Recommend continuing supportive care. Okay to advance diet as tolerated after HIDA scan. GI to continue to follow.

## 2020-06-12 ENCOUNTER — Other Ambulatory Visit: Payer: Self-pay | Admitting: Urology

## 2020-06-12 ENCOUNTER — Ambulatory Visit: Payer: Medicare Other | Admitting: Orthopedic Surgery

## 2020-06-12 ENCOUNTER — Inpatient Hospital Stay (HOSPITAL_COMMUNITY): Payer: Medicare Other

## 2020-06-12 ENCOUNTER — Encounter (HOSPITAL_COMMUNITY): Payer: Self-pay | Admitting: Internal Medicine

## 2020-06-12 DIAGNOSIS — K8020 Calculus of gallbladder without cholecystitis without obstruction: Secondary | ICD-10-CM | POA: Diagnosis not present

## 2020-06-12 DIAGNOSIS — K828 Other specified diseases of gallbladder: Secondary | ICD-10-CM | POA: Diagnosis not present

## 2020-06-12 DIAGNOSIS — C61 Malignant neoplasm of prostate: Secondary | ICD-10-CM

## 2020-06-12 DIAGNOSIS — K559 Vascular disorder of intestine, unspecified: Secondary | ICD-10-CM | POA: Diagnosis not present

## 2020-06-12 LAB — COMPREHENSIVE METABOLIC PANEL
ALT: 10 U/L (ref 0–44)
AST: 9 U/L — ABNORMAL LOW (ref 15–41)
Albumin: 1.9 g/dL — ABNORMAL LOW (ref 3.5–5.0)
Alkaline Phosphatase: 78 U/L (ref 38–126)
Anion gap: 8 (ref 5–15)
BUN: 24 mg/dL — ABNORMAL HIGH (ref 8–23)
CO2: 22 mmol/L (ref 22–32)
Calcium: 8.9 mg/dL (ref 8.9–10.3)
Chloride: 108 mmol/L (ref 98–111)
Creatinine, Ser: 0.87 mg/dL (ref 0.61–1.24)
GFR, Estimated: >60 mL/min (ref >60)
Glucose, Bld: 123 mg/dL — ABNORMAL HIGH (ref 70–99)
Potassium: 3.5 mmol/L (ref 3.5–5.1)
Sodium: 138 mmol/L (ref 135–145)
Total Bilirubin: 0.3 mg/dL (ref 0.3–1.2)
Total Protein: 5 g/dL — ABNORMAL LOW (ref 6.5–8.1)

## 2020-06-12 LAB — GASTROINTESTINAL PANEL BY PCR, STOOL (REPLACES STOOL CULTURE)

## 2020-06-12 LAB — CBC
HCT: 33.6 % — ABNORMAL LOW (ref 39.0–52.0)
Hemoglobin: 10.4 g/dL — ABNORMAL LOW (ref 13.0–17.0)
MCH: 25.1 pg — ABNORMAL LOW (ref 26.0–34.0)
MCHC: 31 g/dL (ref 30.0–36.0)
MCV: 81.2 fL (ref 80.0–100.0)
Platelets: 364 10*3/uL (ref 150–400)
RBC: 4.14 MIL/uL — ABNORMAL LOW (ref 4.22–5.81)
RDW: 15.9 % — ABNORMAL HIGH (ref 11.5–15.5)
WBC: 8.2 10*3/uL (ref 4.0–10.5)
nRBC: 0 % (ref 0.0–0.2)

## 2020-06-12 MED ORDER — SINCALIDE 5 MCG IJ SOLR
INTRAMUSCULAR | Status: AC
  Administered 2020-06-12: 1.68 ug
  Filled 2020-06-12: qty 5

## 2020-06-12 MED ORDER — STERILE WATER FOR INJECTION IJ SOLN
INTRAMUSCULAR | Status: AC
  Administered 2020-06-12: 1.68 mL
  Filled 2020-06-12: qty 10

## 2020-06-12 MED ORDER — LOPERAMIDE HCL 2 MG PO CAPS
2.0000 mg | ORAL_CAPSULE | Freq: Two times a day (BID) | ORAL | Status: DC
  Administered 2020-06-12 – 2020-06-13 (×3): 2 mg via ORAL
  Filled 2020-06-12 (×4): qty 1

## 2020-06-12 MED ORDER — LISINOPRIL 10 MG PO TABS
40.0000 mg | ORAL_TABLET | Freq: Every day | ORAL | Status: DC
Start: 2020-06-12 — End: 2020-06-14
  Administered 2020-06-12 – 2020-06-14 (×3): 40 mg via ORAL
  Filled 2020-06-12 (×3): qty 4

## 2020-06-12 MED ORDER — PANTOPRAZOLE SODIUM 40 MG PO TBEC
40.0000 mg | DELAYED_RELEASE_TABLET | Freq: Every day | ORAL | Status: DC
  Administered 2020-06-13 – 2020-06-14 (×2): 40 mg via ORAL
  Filled 2020-06-12 (×2): qty 1

## 2020-06-12 MED ORDER — HYDRALAZINE HCL 20 MG/ML IJ SOLN
10.0000 mg | INTRAMUSCULAR | Status: DC | PRN
  Administered 2020-06-12 (×2): 10 mg via INTRAVENOUS
  Filled 2020-06-12 (×2): qty 1

## 2020-06-12 MED ORDER — TECHNETIUM TC 99M MEBROFENIN IV KIT
5.0000 | PACK | Freq: Once | INTRAVENOUS | Status: AC | PRN
  Administered 2020-06-12: 5.1 via INTRAVENOUS

## 2020-06-12 NOTE — Progress Notes (Signed)
    Subjective: Intermittent upper abdominal pain, not worsened by eating yesterday. Notes some RUQ/LUQ discomfort earlier this morning but resolved now. No diarrhea. Soft BM yesterday per nursing staff. No overt bleeding.   Objective: Vital signs in last 24 hours: Temp:  [97.3 F (36.3 C)-98.6 F (37 C)] 97.3 F (36.3 C) (01/03 0541) Pulse Rate:  [71-109] 109 (01/03 0837) Resp:  [19-25] 20 (01/03 0541) BP: (147-190)/(63-88) 190/83 (01/03 0541) SpO2:  [92 %-99 %] 94 % (01/03 0541) Last BM Date: 06/10/20 General:   Alert and oriented, pleasant Head:  Normocephalic and atraumatic. Eyes:  No icterus, sclera clear. Conjuctiva pink.  Abdomen:  Bowel sounds present, soft, RUQ TTP, no rebound or guarding. Obese Neurologic:  Alert and  oriented x4 Psych:  Alert and cooperative. Normal mood and affect.  Intake/Output from previous day: 01/02 0701 - 01/03 0700 In: 417.9 [P.O.:240; I.V.:100; IV Piggyback:77.9] Out: 850 [Urine:850] Intake/Output this shift: No intake/output data recorded.  Lab Results: Recent Labs    06/10/20 0644 06/11/20 0710 06/12/20 0410  WBC 12.6* 9.5 8.2  HGB 10.5* 10.2* 10.4*  HCT 34.2* 33.2* 33.6*  PLT 417* 372 364   BMET Recent Labs    06/10/20 0644 06/11/20 0710 06/12/20 0410  NA 137 136 138  K 4.4 4.1 3.5  CL 111 111 108  CO2 21* 22 22  GLUCOSE 126* 115* 123*  BUN 39* 33* 24*  CREATININE 1.20 1.08 0.87  CALCIUM 9.5 9.2 8.9   LFT Recent Labs    06/09/20 1117 06/10/20 0644 06/12/20 0410  PROT 5.4* 5.2* 5.0*  ALBUMIN 2.3* 2.2* 1.9*  AST 11* 9* 9*  ALT 15 14 10   ALKPHOS 82 85 78  BILITOT 0.4 0.3 0.3   PT/INR Recent Labs    06/09/20 1117  LABPROT 15.2  INR 1.3*    Assessment: 87 year old male with multiple comorbidities, history of likely ischemic colitis in Dec 2021 during prior admission with CTA noting proximal stenoses of IMA but celiac and SMA remained widely patent, presenting again this admission with abdominla pain and  diarrhea, with CT showing persistent left-sided colitis. Also noted to have gallbladder distension and haziness, with Jan 2022 noting gallbladder sludge with probable punctate stones, slight increased wall thickening. Flex sig yesterday with diverticulosis, stool in sigmoid and descending colon. Biopsies taken of sigmoid and descending colon, along with stool sample. No active inflammation seen but bowel prep not ideal.   Clinically, he has had improvement with resolution of diarrhea and improvement in abdominal pain; however, he does note vague upper abdominal pain and has notable TTP RUQ. Known cholelithiasis. HIDA scan for today. May need supportive measures and would not be an ideal surgical candidate in light of comorbidities. Await HIDA findings.   Cdiff negative. GI pathogen panel PCR in process.    Plan: Resume diet once HIDA complete Follow-up on pending GI pathogen panel HIDA today PPI daily   [place], PhD, ANP-BC [facility]    LOS: 3 days    06/12/2020, 8:38 AM

## 2020-06-12 NOTE — Consult Note (Signed)
[facility] Consult  Reason for Consult: Biliary dyskinesia?  Referring Physician:  Dr. Sherryll Burger   Chief Complaint    Abdominal Pain      HPI: Jeffrey Sherman is a 87 y.o. male with interstitial lung disease, dementia, colitis with abdominal pain and diarrhea who was here recently and got readmitted with abdominal pain that has been intermittent and not made worse by food. He has also been having significant diarrhea. The diarrhea is keeping him from doing therapy at [facility] where he lives and also making him very depressed per his daughter in law who is at the bedside.  He had a CT on admission with left sided colitis and a distended gallbladder. US showed some stones and some wall thickness. HIDA was negative for acute cholecystitis but had abnormal function wit no ejection of bile.    The patient says that he ate lunch of a sandwich and is feeling fine. He says that he cannot say that food makes the pain worse. Today he is hurting more on his right side compared to the left.   He had surgery on a broken right hip a few months ago and underwent spinal and general anesthesia per the reports.  He is on Diltiazem a calcium channel blocker and has been acutely ill recently.  He says he is still having diarrhea.   Past Medical History:  Diagnosis Date  . Anxiety   . Cancer [facility])    prostate  . Chronic chest wall pain   . Essential hypertension, benign 06/20/2016  . GAD (generalized anxiety disorder)   . GERD (gastroesophageal reflux disease)   . H/O echocardiogram 2016   normal  . Heart murmur    "leaking heart valve"  . High cholesterol 06/20/2016  . HOH (hard of hearing)    left  . Hx of falling   . Hyperlipidemia   . Interstitial pulmonary disease (HCC)   . MDD (major depressive disorder)   . Murmur, cardiac   . Poor historian   . PUD (peptic ulcer disease)   . PUD (peptic ulcer disease)   . RLS (restless legs syndrome)   . Shortness of breath   . Wrist  fracture 12/16/2011    Past Surgical History:  Procedure Laterality Date  . COLONOSCOPY  2005   Rehman: Sigmoid colon diverticulosis, moderate sized external hemorrhoids. A few focal areas of mucosal erythema felt to be nonspecific.  Marland Kitchen ESOPHAGOGASTRODUODENOSCOPY    . ESOPHAGOGASTRODUODENOSCOPY N/A 07/18/2016   Rehman: normal exam except duodenal scar. h.pylori serologies negative  . FLEXIBLE SIGMOIDOSCOPY N/A 06/11/2020   Procedure: FLEXIBLE SIGMOIDOSCOPY;  Surgeon: Lanelle Bal, DO;  Location: [facility] END[facility] SUITE;  Service: Endoscopy;  Laterality: N/A;  . HIP PINNING,CANNULATED Right 03/10/2020   Procedure: INTERNAL FIXATION RIGHT HIP;  Surgeon: Vickki Hearing, MD;  Location: [facility] ORS;  Service: Orthopedics;  Laterality: Right;  . ORIF WRIST FRACTURE  12/19/2011   Procedure: OPEN REDUCTION INTERNAL FIXATION (ORIF) WRIST FRACTURE;  Surgeon: Vickki Hearing, MD;  Location: [facility] ORS;  Service: Orthopedics;  Laterality: Right;  . prostate cancer     Diagnosed in the 90s  . TONSILLECTOMY      Family History  Problem Relation Age of Onset  . Cancer Other   . Heart attack Mother   . Cancer Brother   . Heart attack Brother   . Heart disease Sister        by pass    Social History   Tobacco Use  .  Smoking status: Never Smoker  . Smokeless tobacco: Never Used  Vaping Use  . Vaping Use: Never used  Substance Use Topics  . Alcohol use: No  . Drug use: No    Medications:  I have reviewed the patient's current medications. Prior to Admission:  Medications Prior to Admission  Medication Sig Dispense Refill Last Dose  . acetaminophen (TYLENOL) 500 MG tablet Take 1,000 mg by mouth in the morning, at noon, and at bedtime.   06/09/2020 at Unknown time  . alfuzosin (UROXATRAL) 10 MG 24 hr tablet Take 10 mg by mouth daily with breakfast.   06/09/2020 at Unknown time  . Amino Acids-Protein Hydrolys (PRO-STAT PO) liquid; 15-100 gram-kcal/30 mL; amt: 30 ml; oral Special Instructions: for  albumin 2.5 With Meals   06/09/2020 at Unknown time  . Ascorbic Acid (VITAMIN C) 1000 MG tablet Take 1,000 mg by mouth daily.   06/09/2020 at Unknown time  . aspirin 81 MG chewable tablet Chew 81 mg by mouth daily.   06/09/2020 at Unknown time  . Balsam [place]-Castor Oil [facility]) OINT Special Instructions: Apply to bilateral buttocks & sacral area qshift for blanchable erythema.   06/09/2020 at Unknown time  . calcium carbonate (TUMS - DOSED IN MG ELEMENTAL CALCIUM) 500 MG chewable tablet Chew 1 tablet by mouth 2 (two) times daily as needed for indigestion or heartburn.    Past Week at Unknown time  . diltiazem (CARDIZEM CD) 360 MG 24 hr capsule Take 360 mg by mouth daily. Start 03/23/2020   06/09/2020 at Unknown time  . ferrous sulfate 325 (65 FE) MG tablet Take 325 mg by mouth daily with breakfast. For Anemia and  Iron Deficiency   06/09/2020 at Unknown time  . Flax Oil-Fish Oil-Borage Oil (FISH OIL-FLAX OIL-BORAGE OIL) CAPS Take 1 capsule by mouth daily.   06/09/2020 at Unknown time  . hydrALAZINE (APRESOLINE) 100 MG tablet Take 100 mg by mouth 3 (three) times daily.   06/09/2020 at Unknown time  . lisinopril (ZESTRIL) 40 MG tablet Take 1 tablet (40 mg total) by mouth daily.   06/09/2020 at Unknown time  . loperamide (IMODIUM A-D) 2 MG tablet amt: 2mg ; oral Special Instructions: after each stool do not exceed more than 4tabs in 24 hours, As Needed   Past Week at Unknown time  . Melatonin 10 MG TABS Take 10 mg by mouth at bedtime.   06/08/2020 at Unknown time  . memantine (NAMENDA) 10 MG tablet Take 10 mg by mouth 2 (two) times daily.   06/09/2020 at Unknown time  . metoprolol tartrate (LOPRESSOR) 25 MG tablet Take 1 tablet (25 mg total) by mouth 2 (two) times daily.   06/09/2020 at Unknown time  . nystatin (MYCOSTATIN) 100000 UNIT/ML suspension Take 5 mLs by mouth 4 (four) times daily. For Oral Thrush   06/09/2020 at Unknown time  . omeprazole (PRILOSEC) 20 MG capsule Take 1 capsule (20 mg total)  by mouth daily before breakfast. 30 capsule 5 06/09/2020 at Unknown time  . ondansetron (ZOFRAN-ODT) 8 MG disintegrating tablet Take 8 mg by mouth 3 (three) times daily before meals. For vomiting and nausea   06/09/2020 at Unknown time  . polyethylene glycol (MIRALAX / GLYCOLAX) 17 g packet Take 17 g by mouth daily as needed.   Past Week at Unknown time  . potassium chloride SA (KLOR-CON) 20 MEQ tablet Take 20 mEq by mouth 2 (two) times daily.    06/09/2020 at Unknown time  . rOPINIRole (REQUIP) 0.25 MG tablet  Take 0.25 mg by mouth at bedtime.   06/08/2020 at Unknown time  . salicylic acid 17 % gel Apply 1 application topically in the morning and at bedtime. Apply to warts to left hand   06/09/2020 at Unknown time  . senna-docusate (SENOKOT-S) 8.6-50 MG tablet Take 1 tablet by mouth at bedtime as needed for mild constipation.     Marland Kitchen terazosin (HYTRIN) 5 MG capsule Take 5 mg by mouth at bedtime.    06/08/2020 at Unknown time  . venlafaxine XR (EFFEXOR-XR) 150 MG 24 hr capsule Take 150 mg by mouth daily with breakfast.   06/09/2020 at Unknown time  . cloNIDine (CATAPRES) 0.1 MG tablet Take 0.1 mg by mouth once.   05/22/2020  . ipratropium-albuterol (DUONEB) 0.5-2.5 (3) MG/3ML SOLN Take 3 mLs by nebulization every 6 (six) hours as needed.   unknown  . NON FORMULARY Diet - Mechanical Soft     . OXYGEN Inhale 2 L into the lungs continuous.      Scheduled: . acetaminophen  1,000 mg Oral TID  . alfuzosin  10 mg Oral Q breakfast  . aspirin  81 mg Oral Daily  . diltiazem  360 mg Oral Daily  . enoxaparin (LOVENOX) injection  40 mg Subcutaneous Q24H  . hydrALAZINE  10 mg Oral Q8H  . lisinopril  40 mg Oral Daily  . melatonin  9 mg Oral QHS  . memantine  10 mg Oral BID  . metoprolol tartrate  25 mg Oral BID  . [START ON 06/13/2020] pantoprazole  40 mg Oral QAC breakfast  . rOPINIRole  0.25 mg Oral QHS  . venlafaxine XR  150 mg Oral Q breakfast   Continuous: . dextrose 5% lactated ringers 100 mL/hr at  06/12/20 0641   [id number], HYDROmorphone (DILAUDID) injection, loperamide, ondansetron **OR** ondansetron (ZOFRAN) IV  No Known Allergies   ROS:  A comprehensive review of systems was negative except for: Gastrointestinal: positive for abdominal pain and diarrhea  Blood pressure (!) 150/95, pulse 100, temperature 97.8 F (36.6 C), temperature source Oral, resp. rate (!) 22, height 5\' 9"  (1.753 m), weight 84.4 kg, SpO2 93 %. Physical Exam Vitals reviewed.  Constitutional:      Appearance: He is obese.  HENT:     Head: Normocephalic.  Eyes:     Extraocular Movements: Extraocular movements intact.  Cardiovascular:     Rate and Rhythm: Normal rate.  Pulmonary:     Effort: Pulmonary effort is normal.  Abdominal:     General: There is no distension.     Palpations: Abdomen is soft.     Tenderness: There is abdominal tenderness in the right upper quadrant, right lower quadrant and left upper quadrant.  Skin:    General: Skin is warm.  Neurological:     General: No focal deficit present.     Mental Status: He is alert and oriented to person, place, and time.  Psychiatric:        Mood and Affect: Mood normal.        Behavior: Behavior normal.     Results: Results for orders placed or performed during the hospital encounter of 06/09/20 (from the past 48 hour(s))  CBC     Status: Abnormal   Collection Time: 06/11/20  7:10 AM  Result Value Ref Range   WBC 9.5 4.0 - 10.5 K/uL   RBC 4.05 (L) 4.22 - 5.81 MIL/uL   Hemoglobin 10.2 (L) 13.0 - 17.0 g/dL   HCT 33.2 (L) 39.0 - 52.0 %  MCV 82.0 80.0 - 100.0 fL   MCH 25.2 (L) 26.0 - 34.0 pg   MCHC 30.7 30.0 - 36.0 g/dL   RDW 15.9 (H) 11.5 - 15.5 %   Platelets 372 150 - 400 K/uL   nRBC 0.0 0.0 - 0.2 %    Comment: Performed at [facility], [address], [place], [place] [id number]  Basic metabolic panel     Status: Abnormal   Collection Time: 06/11/20  7:10 AM  Result Value Ref Range   Sodium 136 135 - 145 mmol/L   Potassium  4.1 3.5 - 5.1 mmol/L   Chloride 111 98 - 111 mmol/L   CO2 22 22 - 32 mmol/L   Glucose, Bld 115 (H) 70 - 99 mg/dL    Comment: Glucose reference range applies only to samples taken after fasting for at least 8 hours.   BUN 33 (H) 8 - 23 mg/dL   Creatinine, Ser 1.08 0.61 - 1.24 mg/dL   Calcium 9.2 8.9 - 10.3 mg/dL   GFR, Estimated >60 >60 mL/min    Comment: (NOTE) Calculated using the CKD-EPI Creatinine Equation (2021)    Anion gap 3 (L) 5 - 15    Comment: Performed at [facility], [address], [place], [place] [address]  CBC     Status: Abnormal   Collection Time: 06/12/20  4:10 AM  Result Value Ref Range   WBC 8.2 4.0 - 10.5 K/uL   RBC 4.14 (L) 4.22 - 5.81 MIL/uL   Hemoglobin 10.4 (L) 13.0 - 17.0 g/dL   HCT 33.6 (L) 39.0 - 52.0 %   MCV 81.2 80.0 - 100.0 fL   MCH 25.1 (L) 26.0 - 34.0 pg   MCHC 31.0 30.0 - 36.0 g/dL   RDW 15.9 (H) 11.5 - 15.5 %   Platelets 364 150 - 400 K/uL   nRBC 0.0 0.0 - 0.2 %    Comment: Performed at [facility], [address] [place], [place] [address]  Comprehensive metabolic panel     Status: Abnormal   Collection Time: 06/12/20  4:10 AM  Result Value Ref Range   Sodium 138 135 - 145 mmol/L   Potassium 3.5 3.5 - 5.1 mmol/L   Chloride 108 98 - 111 mmol/L   CO2 22 22 - 32 mmol/L   Glucose, Bld 123 (H) 70 - 99 mg/dL    Comment: Glucose reference range applies only to samples taken after fasting for at least 8 hours.   BUN 24 (H) 8 - 23 mg/dL   Creatinine, Ser 0.87 0.61 - 1.24 mg/dL   Calcium 8.9 8.9 - 10.3 mg/dL   Total Protein 5.0 (L) 6.5 - 8.1 g/dL   Albumin 1.9 (L) 3.5 - 5.0 g/dL   AST 9 (L) 15 - 41 U/L   ALT 10 0 - 44 U/L   Alkaline Phosphatase 78 38 - 126 U/L   Total Bilirubin 0.3 0.3 - 1.2 mg/dL   GFR, Estimated >60 >60 mL/min    Comment: (NOTE) Calculated using the CKD-EPI Creatinine Equation (2021)    Anion gap 8 5 - 15    Comment: Performed at [facility], [address], [place], [place] [address]   Personally reviewed HIDA, [place]  and CT- gallbladder with stones and decreased function, distended on CT   NM Hepato W/EF  Result Date: 06/12/2020 CLINICAL DATA:  Cholelithiasis.  Evaluate for biliary dyskinesia. EXAM: NUCLEAR MEDICINE HEPATOBILIARY IMAGING WITH GALLBLADDER EF TECHNIQUE: Sequential images of the abdomen were obtained out to  60 minutes following intravenous administration of radiopharmaceutical. After slow intravenous infusion of 1.7 micrograms Cholecystokinin, gallbladder ejection fraction was determined. RADIOPHARMACEUTICALS:  5.1 mCi Tc-70m Choletec IV COMPARISON:  Right upper quadrant abdominal ultrasound-06/09/2020; CT abdomen pelvis-06/09/2020 FINDINGS: There is homogeneous distribution of injected radiotracer throughout the hepatic parenchyma. There is early opacification of the gallbladder, initially seen on the 15 minutes anterior projection planar image There is early excretion of radiotracer with opacification of the proximal small bowel, initially seen on the 20 minutes anterior projection planar image. Following the administration of CCK, there is little to no gallbladder emptying. Additionally, the patient reported mild abdominal pain during the CCK infusion. IMPRESSION: 1. No scintigraphic evidence of acute cholecystitis. 2. Findings compatible with severe biliary dyskinesia with little to no gallbladder emptying and elicitation of abdominal pain with the CCK administration. Electronically Signed   By: Sandi Mariscal M.D.   On: 06/12/2020 11:52     Assessment & Plan:  Jeffrey Sherman is a 87 y.o. male with abdominal pain that is intermittent and not associated with food per say. He has had ischemic colitis recently and lots of diarrhea. He is starting to have more right sided pain today and HIDA is concerning for biliary dyskinesia but this is also in the inpatient setting where the patient is inpatient, on CCB, and a short duration of the symptoms < 3 months. He very well may end up and need his gallbladder  removed and have some degree of biliary dyskinesia causing the symptoms but I do not think rushing into surgery is the best option.   -Recommend low fat / bland diet, patient and daughter in law concerned about what he is given to eat at [facility], may need specific restrictions there -Recommend imodium for diarrhea if GI ok with this and infectious etiologies have been ruled out like C dif; cholecystectomy will likely make diarrhea worse  -Recommend outpatient follow up in 1 month to determine symptoms and if additional testing needs to be done prior to deciding if he remove his gallbladder -Discussed that he may ultimately need this done but that we are not going to rush into this at this time    All questions were answered to the satisfaction of the patient and family. Updated Dr. Manuella Ghazi and team.    [name] 06/12/2020, 3:55 PM

## 2020-06-12 NOTE — TOC Initial Note (Signed)
Transition of Care [facility]) - Initial/Assessment Note    Patient Details  Name: Jeffrey Sherman MRN: [id number] Date of Birth: 1930-03-14  [facility] CM/SW Contact:    Salome Arnt, [place] Phone Number: 06/12/2020, 9:17 AM  Clinical Narrative:  Pt admitted due to colitis. He was hospitalized in December as well. LCSW completed assessment with pt's daughter-in-law, Silva Bandy as pt oriented to self only per chart. Silva Bandy reports pt has been a resident at [facility] since he fractured his hip at the end of September. She confirms plan to return to [facility] when medically stable and indicates he will be long term. Per Marianna Fuss at [facility], pt is okay to return and no FL2 needed. TOC will continue to follow.                   Expected Discharge Plan: Long Term Nursing Home Barriers to Discharge: Continued Medical Work up   Patient Goals and CMS Choice Patient states their goals for this hospitalization and ongoing recovery are:: return to [facility]   Choice offered to / list presented to : Adult Children  Expected Discharge Plan and Services Expected Discharge Plan: Long Term Nursing Home In-house Referral: Clinical Social Work   Post Acute Care Choice: Nursing Home Living arrangements for the past 2 months: [place]                                      Prior Living Arrangements/Services Living arrangements for the past 2 months: [place] Lives with:: Facility Resident Patient language and need for interpreter reviewed:: Yes Do you feel safe going back to the place where you live?: Yes      Need for Family Participation in Patient Care: Yes (Comment) Care giver support system in place?: Yes (comment)   Criminal Activity/Legal Involvement Pertinent to Current Situation/Hospitalization: No - Comment as needed  Activities of Daily Living Home Assistive Devices/Equipment: Wheelchair ADL Screening (condition at time of admission) Patient's cognitive  ability adequate to safely complete daily activities?: Yes Is the patient deaf or have difficulty hearing?: No Does the patient have difficulty seeing, even when wearing glasses/contacts?: No Does the patient have difficulty concentrating, remembering, or making decisions?: No Patient able to express need for assistance with ADLs?: Yes Does the patient have difficulty dressing or bathing?: Yes Independently performs ADLs?: No Does the patient have difficulty walking or climbing stairs?: Yes Weakness of Legs: Both Weakness of Arms/Hands: None  Permission Sought/Granted         Permission granted to share info w AGENCY: [facility]  Permission granted to share info w Relationship: SNF     Emotional Assessment       Orientation: : Oriented to Self Alcohol / Substance Use: Not Applicable Psych Involvement: No (comment)  Admission diagnosis:  Colitis [K52.9] RUQ pain [R10.11] Calculus of gallbladder without cholecystitis without obstruction [K80.20] Ischemic colitis ([place]) [K55.9] Patient Active Problem List   Diagnosis Date Noted  . Hyperkalemia 06/09/2020  . Nausea vomiting and diarrhea 06/08/2020  . Oral thrush 06/08/2020  . Nausea & vomiting 06/08/2020  . Acute renal failure (ARF) ([place]) 06/08/2020  . Right hip pain   . Lower GI bleed   . Ischemic colitis ([place]) 05/24/2020  . Sepsis ([place]) 05/22/2020  . Restless leg syndrome 04/21/2020  . Protein-calorie malnutrition, severe ([place]) 03/22/2020  . Dementia without behavioral disturbance ([place]) 03/22/2020  . Iron  deficiency anemia due to dietary causes 03/22/2020  . Prostate cancer (HCC) 03/15/2020  . Overactive bladder 03/15/2020  . Dyslipidemia 03/15/2020  . Major depression, recurrent, chronic (HCC) 03/15/2020  . Interstitial lung disease (HCC) 03/15/2020  . Closed displaced fracture of right femoral neck (HCC) 03/10/20 cannulated screw fixation  03/09/2020  . Colitis   . Acute blood loss as cause of postoperative anemia   .  Rectal bleeding 11/06/2017  . Hypokalemia 11/06/2017  . Renal insufficiency 11/06/2017  . Essential hypertension 06/20/2016  . High cholesterol 06/20/2016  . Gastroesophageal reflux disease without esophagitis 06/20/2016  . Dyspnea 12/17/2013  . Chronic constipation 12/16/2011   PCP:  Sharee Holster, NP Pharmacy:   [facility] [place],  - [address] [place] [address] Phone: [phone] Fax: [phone]  [facility] - [place], [place] - [address] [place] [address] Phone: [phone] Fax: [phone]  [facility] - [place], [place] - [address] [place] [address] Phone: [phone] Fax: [phone]     Social Determinants of Health (SDOH) Interventions    Readmission Risk Interventions Readmission Risk Prevention Plan 06/12/2020 05/24/2020  Transportation Screening Complete Complete  Medication Review Oceanographer) Complete Complete  PCP or Specialist appointment within 3-5 days of discharge Complete -  HRI or Home Care Consult Complete -  SW Recovery Care/Counseling Consult Complete -  Palliative Care Screening Not Applicable -  Skilled Nursing Facility Complete Complete  Some recent data might be hidden

## 2020-06-12 NOTE — Progress Notes (Signed)
PROGRESS NOTE    Jeffrey RHEIN  [id number] DOB: 1930/04/08 DOA: 06/09/2020 PCP: Jeffrey Fee, NP   Brief Narrative: Jeffrey Sherman is a 87 y.o.Mwith hx ILD on home O2, moderate dementia, wheelchair bound last 6 months, lives at [facility], prostate cancer remote, HTN and PVD as well as recent admission for colitis, presumed ischemic versus infectiouswho presents with progressive abdominal pain, nausea, diarrhea and abdominal distension.  Patient was admitted with identical symptoms plus hematochezia earlier this month for 10 days. CT showed colitis, started on antibiotics. GI consulted. Management was supportive. Cdiff and GI PCR panel negative. Imprved. CTA abdomen showed tandem stenosis of IMA but patent celiac and SMA. WBC, hematochezia, pain and diarrhea had some improvement and patient was discharged.  Since discharge 7 days ago, he was able to tolerate PO intake but still had nausea, abdominal pain. This progressively got worse and so he was started on IV fluids, but had progressively worse abdominal pain, all over, frequent loose stools and nausea and inability to eat. Creatinine started to worsen, and so patient was sent to hospital for GI and Gen Surg consultation.  In the ER, CT abdomen showed persistent left sided colitis but also GB distension and haziness. Creatinine 1.6. Afebrile, and without leukocytosis.    Assessment & Plan:   Principal Problem:   Ischemic colitis ([place]) Active Problems:   Essential hypertension   Prostate cancer ([place])   Protein-calorie malnutrition, severe ([place])   Dementia without behavioral disturbance (HCC)   Iron deficiency anemia due to dietary causes   Right hip pain   Acute renal failure (ARF) (HCC)   Hyperkalemia   Colitis Right upper quadrant pain with biliary dyskinesia Differential still includes infectious, ischemic, and inflammatory colitis.   Now with noted severe biliary dyskinesia on HIDA scan 1/3. Flex sig 1/2  appeared to show a relatively normal colon with large volume stool.  Cdiff negative.    -Discussed case with general surgery Dr. Constance Haw with formal consultation pending 1/3-1/4-much appreciated -Start on soft diet, low-fat -Appreciate ongoing GI consultation -DC Rocephin     Acute kidney injury Hyperkalemia Creatinine 1.6 on admission, resolved to 0.8 today, normal.     -Resume lisinopril  Interstitial lung disease Chronic hypoxic respiratory failure Not on disease specific therapy. -Continuehome O2  Hypertension BP elevated -Continue aspirin, diltiazem, metoprolol -Continue hydralazine  -Resume lisinopril  Dementia Mild acute metabolic encephalopathy due to colitis  -Continue venlafaxine -Dementia precautions  RLS -Continue  Requip  History prostate cancer -Continue alfuzosin  Hx PUD - COntinue PPI  Moderate protein calorie malnutrition As evidenced by albumin 2.2, moderate diffuse loss of subcutaneous muscle mass and fat, poor p.o. intake over the last month due to colitis    DVT prophylaxis:Lovenox Code Status: DNR Family Communication: Spoke with daughter-in-law 1/3 on the phone Disposition Plan:  Status is: Inpatient  Remains inpatient appropriate because:IV treatments appropriate due to intensity of illness or inability to take PO and Inpatient level of care appropriate due to severity of illness   Dispo: The patient is from: SNF              Anticipated d/c is to: SNF              Anticipated d/c date is: 2 days              Patient currently is not medically stable to d/c.   Consultants:   GI  General Surgery  Procedures:   See below  Antimicrobials:  Anti-infectives (From admission, onward)   Start     Dose/Rate Route Frequency Ordered Stop   06/11/20 1600  cefTRIAXone (ROCEPHIN) 2 g in sodium chloride 0.9 % 100 mL IVPB        2 g 200 mL/hr over 30 Minutes Intravenous Every 24 hours 06/11/20 1452          Subjective: Patient seen and evaluated today with ongoing intermittent right upper quadrant abdominal pain.  Does not appear to be affected by eating.  He did have a soft bowel movement yesterday and is getting ready to head down for HIDA scan.  Objective: Vitals:   06/11/20 2149 06/12/20 0541 06/12/20 0837 06/12/20 1205  BP: (!) 180/88 (!) 190/83 (!) 197/105 (!) 152/84  Pulse: 87 93 (!) 109 86  Resp: 20 20    Temp: 98.5 F (36.9 C) (!) 97.3 F (36.3 C)    TempSrc: Oral Oral    SpO2: 94% 94%  96%  Weight:      Height:        Intake/Output Summary (Last 24 hours) at 06/12/2020 1310 Last data filed at 06/12/2020 0900 Gross per 24 hour  Intake 317.92 ml  Output 850 ml  Net -532.08 ml   Filed Weights   06/09/20 1105  Weight: 84.4 kg    Examination:  General exam: Appears calm and comfortable  Respiratory system: Clear to auscultation. Respiratory effort normal. Cardiovascular system: S1 & S2 heard, RRR.  Gastrointestinal system: Abdomen is soft, mild upper quadrant tenderness Central nervous system: Alert and awake Extremities: No edema Skin: No significant lesions noted Psychiatry: Flat affect.    Data Reviewed: I have personally reviewed following labs and imaging studies  CBC: Recent Labs  Lab 06/07/20 0627 06/09/20 1117 06/10/20 0644 06/11/20 0710 06/12/20 0410  WBC 11.9* 9.6 12.6* 9.5 8.2  NEUTROABS 8.9* 7.8*  --   --   --   HGB 9.6* 10.2* 10.5* 10.2* 10.4*  HCT 29.8* 33.5* 34.2* 33.2* 33.6*  MCV 82.8 83.8 83.4 82.0 81.2  PLT 318 413* 417* 372 [id number]   Basic Metabolic Panel: Recent Labs  Lab 06/09/20 0650 06/09/20 1117 06/10/20 0644 06/11/20 0710 06/12/20 0410  NA 133* 136 137 136 138  K 4.7 5.3* 4.4 4.1 3.5  CL 110 111 111 111 108  CO2 19* 20* 21* 22 22  GLUCOSE 105* 107* 126* 115* 123*  BUN 53* 52* 39* 33* 24*  CREATININE 1.59* 1.64* 1.20 1.08 0.87  CALCIUM 8.9 9.3 9.5 9.2 8.9   GFR: Estimated Creatinine Clearance: 58.7 mL/min (by C-G  formula based on SCr of 0.87 mg/dL). Liver Function Tests: Recent Labs  Lab 06/07/20 0627 06/09/20 1117 06/10/20 0644 06/12/20 0410  AST 9* 11* 9* 9*  ALT 11 15 14 10   ALKPHOS 68 82 85 78  BILITOT 0.2* 0.4 0.3 0.3  PROT 4.9* 5.4* 5.2* 5.0*  ALBUMIN 2.1* 2.3* 2.2* 1.9*   Recent Labs  Lab 06/09/20 1117  LIPASE 27   No results for input(s): AMMONIA in the last 168 hours. Coagulation Profile: Recent Labs  Lab 06/09/20 1117  INR 1.3*   Cardiac Enzymes: No results for input(s): CKTOTAL, CKMB, CKMBINDEX, TROPONINI in the last 168 hours. BNP (last 3 results) No results for input(s): PROBNP in the last 8760 hours. HbA1C: No results for input(s): HGBA1C in the last 72 hours. CBG: No results for input(s): GLUCAP in the last 168 hours. Lipid Profile: No results for input(s): CHOL, HDL, LDLCALC, TRIG, CHOLHDL, LDLDIRECT in the last  72 hours. Thyroid Function Tests: No results for input(s): TSH, T4TOTAL, FREET4, T3FREE, THYROIDAB in the last 72 hours. Anemia Panel: No results for input(s): VITAMINB12, FOLATE, FERRITIN, TIBC, IRON, RETICCTPCT in the last 72 hours. Sepsis Labs: Recent Labs  Lab 06/09/20 1143  LATICACIDVEN 0.9    Recent Results (from the past 240 hour(s))  Gastrointestinal Panel by PCR , Stool     Status: None   Collection Time: 06/09/20 11:19 AM   Specimen: Stool  Result Value Ref Range Status   Campylobacter species NOT DETECTED NOT DETECTED Final   Plesimonas shigelloides NOT DETECTED NOT DETECTED Final   Salmonella species NOT DETECTED NOT DETECTED Final   Yersinia enterocolitica NOT DETECTED NOT DETECTED Final   Vibrio species NOT DETECTED NOT DETECTED Final   Vibrio cholerae NOT DETECTED NOT DETECTED Final   Enteroaggregative E coli (EAEC) NOT DETECTED NOT DETECTED Final   Enteropathogenic E coli (EPEC) NOT DETECTED NOT DETECTED Final   Enterotoxigenic E coli (ETEC) NOT DETECTED NOT DETECTED Final   Shiga like toxin producing E coli (STEC) NOT  DETECTED NOT DETECTED Final   Shigella/Enteroinvasive E coli (EIEC) NOT DETECTED NOT DETECTED Final   Cryptosporidium NOT DETECTED NOT DETECTED Final   Cyclospora cayetanensis NOT DETECTED NOT DETECTED Final   Entamoeba histolytica NOT DETECTED NOT DETECTED Final   Giardia lamblia NOT DETECTED NOT DETECTED Final   Adenovirus F40/41 NOT DETECTED NOT DETECTED Final   Astrovirus NOT DETECTED NOT DETECTED Final   Norovirus GI/GII NOT DETECTED NOT DETECTED Final   Rotavirus A NOT DETECTED NOT DETECTED Final   Sapovirus (I, II, IV, and V) NOT DETECTED NOT DETECTED Final    Comment: Performed at [facility], [address] [place], [place] [address]  C Difficile Quick Screen w PCR reflex     Status: None   Collection Time: 06/09/20 11:20 AM   Specimen: STOOL  Result Value Ref Range Status   C Diff antigen NEGATIVE NEGATIVE Final   C Diff toxin NEGATIVE NEGATIVE Final   C Diff interpretation No C. difficile detected.  Final    Comment: Performed at [facility], [address] [place], [place] [address]  Resp Panel by RT-PCR (Flu A&B, Covid) Nasopharyngeal Swab     Status: None   Collection Time: 06/09/20 12:25 PM   Specimen: Nasopharyngeal Swab; Nasopharyngeal(NP) swabs in vial transport medium  Result Value Ref Range Status   SARS Coronavirus 2 by RT PCR NEGATIVE NEGATIVE Final    Comment: (NOTE) SARS-CoV-2 target nucleic acids are NOT DETECTED.  The SARS-CoV-2 RNA is generally detectable in upper respiratory specimens during the acute phase of infection. The lowest concentration of SARS-CoV-2 viral copies this assay can detect is 138 copies/mL. A negative result does not preclude SARS-Cov-2 infection and should not be used as the sole basis for treatment or other patient management decisions. A negative result may occur with  improper specimen collection/handling, submission of specimen other than nasopharyngeal swab, presence of viral mutation(s) within the areas targeted  by this assay, and inadequate number of viral copies(<138 copies/mL). A negative result must be combined with clinical observations, patient history, and epidemiological information. The expected result is Negative.  Fact Sheet for Patients:  BloggerCourse.com  Fact Sheet for Healthcare Providers:  SeriousBroker.it  This test is no t yet approved or cleared by the [place] FDA and  has been authorized for detection and/or diagnosis of SARS-CoV-2 by FDA under an Emergency Use Authorization (EUA). This EUA will remain  in  effect (meaning this test can be used) for the duration of the COVID-19 declaration under Section 564(b)(1) of the Act, 21 U.S.C.section 360bbb-3(b)(1), unless the authorization is terminated  or revoked sooner.       Influenza A by PCR NEGATIVE NEGATIVE Final   Influenza B by PCR NEGATIVE NEGATIVE Final    Comment: (NOTE) The Xpert Xpress SARS-CoV-2/FLU/RSV plus assay is intended as an aid in the diagnosis of influenza from Nasopharyngeal swab specimens and should not be used as a sole basis for treatment. Nasal washings and aspirates are unacceptable for Xpert Xpress SARS-CoV-2/FLU/RSV testing.  Fact Sheet for Patients: EntrepreneurPulse.com.au  Fact Sheet for Healthcare Providers: IncredibleEmployment.be  This test is not yet approved or cleared by the [place] FDA and has been authorized for detection and/or diagnosis of SARS-CoV-2 by FDA under an Emergency Use Authorization (EUA). This EUA will remain in effect (meaning this test can be used) for the duration of the COVID-19 declaration under Section 564(b)(1) of the Act, 21 U.S.C. section 360bbb-3(b)(1), unless the authorization is terminated or revoked.  Performed at [facility], [address] [place], [place] [address]          Radiology Studies: NM Hepato W/EF  Result Date: 06/12/2020 CLINICAL  DATA:  Cholelithiasis.  Evaluate for biliary dyskinesia. EXAM: NUCLEAR MEDICINE HEPATOBILIARY IMAGING WITH GALLBLADDER EF TECHNIQUE: Sequential images of the abdomen were obtained out to 60 minutes following intravenous administration of radiopharmaceutical. After slow intravenous infusion of 1.7 micrograms Cholecystokinin, gallbladder ejection fraction was determined. RADIOPHARMACEUTICALS:  5.1 mCi Tc-22m Choletec IV COMPARISON:  Right upper quadrant abdominal ultrasound-06/09/2020; CT abdomen pelvis-06/09/2020 FINDINGS: There is homogeneous distribution of injected radiotracer throughout the hepatic parenchyma. There is early opacification of the gallbladder, initially seen on the 15 minutes anterior projection planar image There is early excretion of radiotracer with opacification of the proximal small bowel, initially seen on the 20 minutes anterior projection planar image. Following the administration of CCK, there is little to no gallbladder emptying. Additionally, the patient reported mild abdominal pain during the CCK infusion. IMPRESSION: 1. No scintigraphic evidence of acute cholecystitis. 2. Findings compatible with severe biliary dyskinesia with little to no gallbladder emptying and elicitation of abdominal pain with the CCK administration. Electronically Signed   By: Sandi Mariscal M.D.   On: 06/12/2020 11:52        Scheduled Meds: . acetaminophen  1,000 mg Oral TID  . alfuzosin  10 mg Oral Q breakfast  . aspirin  81 mg Oral Daily  . diltiazem  360 mg Oral Daily  . enoxaparin (LOVENOX) injection  40 mg Subcutaneous Q24H  . hydrALAZINE  10 mg Oral Q8H  . melatonin  9 mg Oral QHS  . memantine  10 mg Oral BID  . metoprolol tartrate  25 mg Oral BID  . [START ON 06/13/2020] pantoprazole  40 mg Oral QAC breakfast  . rOPINIRole  0.25 mg Oral QHS  . venlafaxine XR  150 mg Oral Q breakfast   Continuous Infusions: . cefTRIAXone (ROCEPHIN)  IV 2 g (06/11/20 1549)  . dextrose 5% lactated ringers  100 mL/hr at 06/12/20 0641     LOS: 3 days    Time spent: 35 minutes    Myla Mauriello D Manuella Ghazi, DO [facility]  If 7PM-7AM, please contact night-coverage [email or web address] 06/12/2020, 1:10 PM

## 2020-06-13 ENCOUNTER — Inpatient Hospital Stay (HOSPITAL_COMMUNITY): Payer: Medicare Other

## 2020-06-13 DIAGNOSIS — R0602 Shortness of breath: Secondary | ICD-10-CM

## 2020-06-13 DIAGNOSIS — K559 Vascular disorder of intestine, unspecified: Secondary | ICD-10-CM | POA: Diagnosis not present

## 2020-06-13 LAB — COMPREHENSIVE METABOLIC PANEL
ALT: 11 U/L (ref 0–44)
AST: 12 U/L — ABNORMAL LOW (ref 15–41)
Albumin: 2.1 g/dL — ABNORMAL LOW (ref 3.5–5.0)
Alkaline Phosphatase: 78 U/L (ref 38–126)
Anion gap: 8 (ref 5–15)
BUN: 20 mg/dL (ref 8–23)
CO2: 24 mmol/L (ref 22–32)
Calcium: 9.2 mg/dL (ref 8.9–10.3)
Chloride: 108 mmol/L (ref 98–111)
Creatinine, Ser: 0.98 mg/dL (ref 0.61–1.24)
GFR, Estimated: >60 mL/min (ref >60)
Glucose, Bld: 129 mg/dL — ABNORMAL HIGH (ref 70–99)
Potassium: 3.7 mmol/L (ref 3.5–5.1)
Sodium: 140 mmol/L (ref 135–145)
Total Bilirubin: 0.3 mg/dL (ref 0.3–1.2)
Total Protein: 5.3 g/dL — ABNORMAL LOW (ref 6.5–8.1)

## 2020-06-13 LAB — CBC
HCT: 35.9 % — ABNORMAL LOW (ref 39.0–52.0)
Hemoglobin: 11.5 g/dL — ABNORMAL LOW (ref 13.0–17.0)
MCH: 25.7 pg — ABNORMAL LOW (ref 26.0–34.0)
MCHC: 32 g/dL (ref 30.0–36.0)
MCV: 80.1 fL (ref 80.0–100.0)
Platelets: 409 10*3/uL — ABNORMAL HIGH (ref 150–400)
RBC: 4.48 MIL/uL (ref 4.22–5.81)
RDW: 15.9 % — ABNORMAL HIGH (ref 11.5–15.5)
WBC: 12.4 10*3/uL — ABNORMAL HIGH (ref 4.0–10.5)
nRBC: 0.2 % (ref 0.0–0.2)

## 2020-06-13 LAB — URINALYSIS, ROUTINE W REFLEX MICROSCOPIC
Bacteria, UA: NONE SEEN
Bilirubin Urine: NEGATIVE
Glucose, UA: NEGATIVE mg/dL
Hgb urine dipstick: NEGATIVE
Ketones, ur: NEGATIVE mg/dL
Leukocytes,Ua: NEGATIVE
Nitrite: NEGATIVE
Protein, ur: 30 mg/dL — AB
Specific Gravity, Urine: 1.014 (ref 1.005–1.030)
pH: 5 (ref 5.0–8.0)

## 2020-06-13 LAB — RESP PANEL BY RT-PCR (FLU A&B, COVID) ARPGX2
Influenza A by PCR: NEGATIVE
Influenza B by PCR: NEGATIVE
SARS Coronavirus 2 by RT PCR: NEGATIVE

## 2020-06-13 LAB — MAGNESIUM: Magnesium: 1.7 mg/dL (ref 1.7–2.4)

## 2020-06-13 LAB — SURGICAL PATHOLOGY

## 2020-06-13 MED ORDER — FUROSEMIDE 10 MG/ML IJ SOLN
40.0000 mg | Freq: Once | INTRAMUSCULAR | Status: AC
  Administered 2020-06-13: 40 mg via INTRAVENOUS
  Filled 2020-06-13: qty 4

## 2020-06-13 NOTE — Plan of Care (Signed)
  Problem: Education: Goal: Knowledge of General Education information will improve Description: Including pain rating scale, medication(s)/side effects and non-pharmacologic comfort measures Outcome: Progressing   Problem: Health Behavior/Discharge Planning: Goal: Ability to manage health-related needs will improve Outcome: Progressing   Problem: Clinical Measurements: Goal: Ability to maintain clinical measurements within normal limits will improve Outcome: Progressing   Problem: Activity: Goal: Risk for activity intolerance will decrease Outcome: Progressing   Problem: Safety: Goal: Ability to remain free from injury will improve Outcome: Progressing   Problem: Skin Integrity: Goal: Risk for impaired skin integrity will decrease Outcome: Progressing   Problem: Clinical Measurements: Goal: Respiratory complications will improve Outcome: Progressing

## 2020-06-13 NOTE — Progress Notes (Signed)
[facility] Progress Note  2 Days Post-Op  Subjective: Does not complain of abdominal pain and says better today. Tolerated banana pudding and potatoes. Imodium started yesterday and says BM issues are better so far.   Objective: Vital signs in last 24 hours: Temp:  [97.5 F (36.4 C)-99.6 F (37.6 C)] 97.5 F (36.4 C) (01/04 1341) Pulse Rate:  [76-129] 76 (01/04 1341) Resp:  [18-22] 18 (01/04 1341) BP: (118-185)/(65-111) 118/65 (01/04 1341) SpO2:  [91 %-98 %] 95 % (01/04 1341) Last BM Date: 06/11/20  Intake/Output from previous day: 01/03 0701 - 01/04 0700 In: 360 [P.O.:360] Out: -  Intake/Output this shift: Total I/O In: 480 [P.O.:480] Out: 700 [Urine:700]  General appearance: alert and no distress GI: soft, mildly distended, nontender  Lab Results:  Recent Labs    06/12/20 0410 06/13/20 0509  WBC 8.2 12.4*  HGB 10.4* 11.5*  HCT 33.6* 35.9*  PLT 364 409*   BMET Recent Labs    06/12/20 0410 06/13/20 0509  NA 138 140  K 3.5 3.7  CL 108 108  CO2 22 24  GLUCOSE 123* 129*  BUN 24* 20  CREATININE 0.87 0.98  CALCIUM 8.9 9.2   PT/INR No results for input(s): LABPROT, INR in the last 72 hours.  Studies/Results: NM Hepato W/EF  Result Date: 06/12/2020 CLINICAL DATA:  Cholelithiasis.  Evaluate for biliary dyskinesia. EXAM: NUCLEAR MEDICINE HEPATOBILIARY IMAGING WITH GALLBLADDER EF TECHNIQUE: Sequential images of the abdomen were obtained out to 60 minutes following intravenous administration of radiopharmaceutical. After slow intravenous infusion of 1.7 micrograms Cholecystokinin, gallbladder ejection fraction was determined. RADIOPHARMACEUTICALS:  5.1 mCi Tc-69m Choletec IV COMPARISON:  Right upper quadrant abdominal ultrasound-06/09/2020; CT abdomen pelvis-06/09/2020 FINDINGS: There is homogeneous distribution of injected radiotracer throughout the hepatic parenchyma. There is early opacification of the gallbladder, initially seen on the 15  minutes anterior projection planar image There is early excretion of radiotracer with opacification of the proximal small bowel, initially seen on the 20 minutes anterior projection planar image. Following the administration of CCK, there is little to no gallbladder emptying. Additionally, the patient reported mild abdominal pain during the CCK infusion. IMPRESSION: 1. No scintigraphic evidence of acute cholecystitis. 2. Findings compatible with severe biliary dyskinesia with little to no gallbladder emptying and elicitation of abdominal pain with the CCK administration. Electronically Signed   By: Sandi Mariscal M.D.   On: 06/12/2020 11:52   DG Chest Port 1 View  Result Date: 06/13/2020 CLINICAL DATA:  Hypoxemia and shortness of breath. Negative COVID test EXAM: PORTABLE CHEST 1 VIEW COMPARISON:  05/22/2020 FINDINGS: Diffuse interstitial opacity with Awanda Mink lines with possible trace pleural fluid. Stable heart size and aortic contours. Negative for air leak. IMPRESSION: CHF pattern. Electronically Signed   By: Monte Fantasia M.D.   On: 06/13/2020 10:39    Anti-infectives: Anti-infectives (From admission, onward)   Start     Dose/Rate Route Frequency Ordered Stop   06/11/20 1600  cefTRIAXone (ROCEPHIN) 2 g in sodium chloride 0.9 % 100 mL IVPB  Status:  Discontinued        2 g 200 mL/hr over 30 Minutes Intravenous Every 24 hours 06/11/20 1452 06/12/20 1320      Assessment/Plan: Mr. Macari is a 87 yo with recent colitis and possible biliary dyskinesia on HIDA but limited by acute illness, CCB and other things going on at this time.  Pain is not specific for gallbladder pain. He does have stones.  Right now improving Would continue diet and imodium Follow  up as outpatient in 1 month to discuss symptoms and options   Daughter in law and patient in agreement.    LOS: 4 days    Lucretia Roers 06/13/2020

## 2020-06-13 NOTE — Evaluation (Signed)
Physical Therapy Evaluation Patient Details Name: Jeffrey Sherman MRN: [id number] DOB: 1929/07/05 Today's Date: 06/13/2020   History of Present Illness  Jeffrey Sherman is a 87 y.o. M with hx ILD on home O2, moderate dementia, wheelchair bound last 6 months, lives at [facility], prostate cancer remote, HTN and PVD as well as recent admission for colitis, presumed ischemic versus infectious who presents with progressive abdominal pain, nausea, diarrhea and abdominal distension.    Clinical Impression  Patient demonstrates slow labored movement for sitting up at bedside, very unsteady on feet with near loss of balance when taking side steps, limited secondary to generalized weakness and poor standing balance.  Patient tolerated sitting up in chair after therapy - RN aware.  Patient will benefit from continued physical therapy in hospital and recommended venue below to increase strength, balance, endurance for safe ADLs and gait.      Follow Up Recommendations SNF    Equipment Recommendations  None recommended by PT    Recommendations for Other Services       Precautions / Restrictions Precautions Precautions: Fall Restrictions Weight Bearing Restrictions: No      Mobility  Bed Mobility Overal bed mobility: Needs Assistance Bed Mobility: Supine to Sit     Supine to sit: Mod assist     General bed mobility comments: increased time, labored movement    Transfers Overall transfer level: Needs assistance Equipment used: Rolling walker (2 wheeled) Transfers: Sit to/from [employer] Sit to Stand: Mod assist Stand pivot transfers: Mod assist       General transfer comment: slow labored unsteady movement  Ambulation/Gait Ambulation/Gait assistance: Mod assist;Max assist Gait Distance (Feet): 4 Feet Assistive device: Rolling walker (2 wheeled) Gait Pattern/deviations: Decreased step length - right;Decreased step length - left;Decreased stride length Gait  velocity: decreased   General Gait Details: limited to 4-5 slow labored side steps with near loss of balance due to weakness  Stairs            Wheelchair Mobility    Modified Rankin (Stroke Patients Only)       Balance Overall balance assessment: Needs assistance Sitting-balance support: Feet supported;No upper extremity supported Sitting balance-Leahy Scale: Fair Sitting balance - Comments: fair/good seated at EOB   Standing balance support: During functional activity;Bilateral upper extremity supported Standing balance-Leahy Scale: Poor Standing balance comment: fair/poor using RW                             Pertinent Vitals/Pain Pain Assessment: Faces Faces Pain Scale: Hurts little more Pain Location: right side of abdomen with pressure Pain Descriptors / Indicators: Sore;Grimacing;Discomfort Pain Intervention(s): Limited activity within patient's tolerance;Monitored during session    Home Living Family/patient expects to be discharged to:: Skilled nursing facility                      Prior Function Level of Independence: Needs assistance   Gait / Transfers Assistance Needed: Assisted transfers, has been non-ambulatory for last few months  ADL's / Homemaking Assistance Needed: assisted by SNF staff        Hand Dominance        Extremity/Trunk Assessment   Upper Extremity Assessment Upper Extremity Assessment: Generalized weakness    Lower Extremity Assessment Lower Extremity Assessment: Generalized weakness    Cervical / Trunk Assessment Cervical / Trunk Assessment: Normal  Communication   Communication: No difficulties  Cognition Arousal/Alertness: Awake/alert Behavior During  Therapy: WFL for tasks assessed/performed Overall Cognitive Status: Within Functional Limits for tasks assessed                                        General Comments      Exercises     Assessment/Plan    PT Assessment  Patient needs continued PT services  PT Problem List Decreased strength;Decreased activity tolerance;Decreased balance;Decreased mobility       PT Treatment Interventions DME instruction;Gait training;Stair training;Functional mobility training;Therapeutic activities;Therapeutic exercise;Patient/family education;Balance training    PT Goals (Current goals can be found in the Care Plan section)  Acute Rehab PT Goals Patient Stated Goal: return to SNF and restart rehab PT Goal Formulation: With patient Time For Goal Achievement: 06/27/20 Potential to Achieve Goals: Good    Frequency Min 3X/week   Barriers to discharge        Co-evaluation               AM-PAC PT "6 Clicks" Mobility  Outcome Measure Help needed turning from your back to your side while in a flat bed without using bedrails?: A Lot Help needed moving from lying on your back to sitting on the side of a flat bed without using bedrails?: A Lot Help needed moving to and from a bed to a chair (including a wheelchair)?: A Lot Help needed standing up from a chair using your arms (e.g., wheelchair or bedside chair)?: A Lot Help needed to walk in hospital room?: A Lot Help needed climbing 3-5 steps with a railing? : Total 6 Click Score: 11    End of Session Equipment Utilized During Treatment: Oxygen Activity Tolerance: Patient tolerated treatment well;Patient limited by fatigue Patient left: with call bell/phone within reach;in chair;with chair alarm set Nurse Communication: Mobility status PT Visit Diagnosis: Unsteadiness on feet (R26.81);Other abnormalities of gait and mobility (R26.89);Muscle weakness (generalized) (M62.81)    Time: [id number] PT Time Calculation (min) (ACUTE ONLY): 23 min   Charges:   PT Evaluation $PT Eval Moderate Complexity: 1 Mod PT Treatments $Therapeutic Activity: 23-37 mins        12:27 PM, 06/13/20 Lonell Grandchild, MPT Physical Therapist with [facility] [phone] office [phone] mobile phone

## 2020-06-13 NOTE — Progress Notes (Signed)
PROGRESS NOTE    Jeffrey Sherman  [id number] DOB: [birth year] DOA: 06/09/2020 PCP: Gerlene Fee, NP   Brief Narrative:  Jeffrey Sherman is a 87 y.o.Mwith hx ILD on home O2, moderate dementia, wheelchair bound last 6 months, lives at [facility], prostate cancer remote, HTN and PVD as well as recent admission for colitis, presumed ischemic versus infectiouswho presents with progressive abdominal pain, nausea, diarrhea and abdominal distension.  Patient was admitted with identical symptoms plus hematochezia earlier this month for 10 days. CT showed colitis, started on antibiotics. GI consulted. Management was supportive. Cdiff and GI PCR panel negative. Imprved. CTA abdomen showed tandem stenosis of IMA but patent celiac and SMA. WBC, hematochezia, pain and diarrhea had some improvement and patient was discharged.  Since discharge 7 days ago, he was able to tolerate PO intake but still had nausea, abdominal pain. This progressively got worse and so he was started on IV fluids, but had progressively worse abdominal pain, all over, frequent loose stools and nausea and inability to eat. Creatinine started to worsen, and so patient was sent to hospital for GI and Gen Surg consultation.  In the ER, CT abdomen showed persistent left sided colitis but also GB distension and haziness. Creatinine 1.6. Afebrile, and without leukocytosis.  -Patient now appears to be eating better with less diarrhea noted.  He is diuresing today as he is slightly volume overloaded and requiring some oxygen.  Likely can be discharged in a.m. back to SNF if back on room air and without further significant symptoms.   Assessment & Plan:   Principal Problem:   Ischemic colitis ([place]) Active Problems:   Essential hypertension   Prostate cancer ([place])   Protein-calorie malnutrition, severe ([place])   Dementia without behavioral disturbance ([place])   Iron deficiency anemia due to dietary causes   Right hip pain    Acute renal failure (ARF) ([facility])   Hyperkalemia   Calculus of gallbladder without cholecystitis without obstruction   Biliary dyskinesia  Colitis Right upper quadrant pain with biliary dyskinesia Differential still includes infectious, ischemic, and inflammatory colitis.  Now with noted severe biliary dyskinesia on HIDA scan 1/3. Flex sig 1/2 appeared to show a relatively normal colon with large volume stool. Cdiff negative.   -Discussed case with general surgery Dr. Constance Haw with formal consultation pending 1/3-1/4-much appreciated -Start on soft diet, low-fat -Appreciate ongoing GI consultation -DC Rocephin   Acute kidney injury-resolved Hyperkalemia-resolved Creatinine1.6 on admission, resolved to 0.8 today, normal. -Resumed lisinopril  Interstitial lung disease Chronic hypoxic respiratory failure Not on disease specific therapy. -Continuehome O2  Hypertension BP elevated -Continue aspirin, diltiazem, metoprolol -Continuehydralazine  -Resumed lisinopril  Dementia Mild acute metabolic encephalopathy due to colitis  -Continue venlafaxine -Dementia precautions  RLS -Continue Requip  History prostate cancer -Continue alfuzosin  Hx PUD -COntinue PPI  Moderate protein calorie malnutrition As evidenced by albumin 2.2, moderate diffuse loss of subcutaneous muscle mass and fat, poor p.o. intake over the last month due to colitis  Mild hypoxemia with volume overload -Plan to diurese 1/4 and wean oxygen as tolerated -Trial of Lasix 40 mg IV x1 dose today  DVT prophylaxis:Lovenox Code Status: DNR Family Communication: Spoke with daughter-in-law 1/4 on the phone Disposition Plan:  Status is: Inpatient  Remains inpatient appropriate because:IV treatments appropriate due to intensity of illness or inability to take PO and Inpatient level of care appropriate due to severity of illness   Dispo: The patient is from: SNF   Anticipated d/c is to: SNF  Anticipated d/c date is: 1 days  Patient currently is not medically stable to d/c.  May discharge in a.m. if tolerating diet and without further diarrhea.  Also with bacteremia after diuresis.   Consultants:   GI  General Surgery  Procedures:   See below  Antimicrobials:  Anti-infectives (From admission, onward)   Start     Dose/Rate Route Frequency Ordered Stop   06/11/20 1600  cefTRIAXone (ROCEPHIN) 2 g in sodium chloride 0.9 % 100 mL IVPB  Status:  Discontinued        2 g 200 mL/hr over 30 Minutes Intravenous Every 24 hours 06/11/20 1452 06/12/20 1320       Subjective: Patient seen and evaluated today with improved abdominal pain with less diarrhea noted.  No nausea or vomiting and appears to be tolerating some of his intent.  Objective: Vitals:   06/13/20 0713 06/13/20 1024 06/13/20 1341 06/13/20 1442  BP: (!) 177/104 126/86 118/65 132/60  Pulse: (!) 110 77 76 81  Resp:  18 18 18   Temp:  97.6 F (36.4 C) (!) 97.5 F (36.4 C) 98 F (36.7 C)  TempSrc:  Oral Oral Oral  SpO2: 98% 94% 95% 96%  Weight:      Height:        Intake/Output Summary (Last 24 hours) at 06/13/2020 1529 Last data filed at 06/13/2020 1330 Gross per 24 hour  Intake 600 ml  Output 700 ml  Net -100 ml   Filed Weights   06/09/20 1105  Weight: 84.4 kg    Examination:  General exam: Appears calm and comfortable  Respiratory system: Clear to auscultation. Respiratory effort normal.  Currently on nasal cannula oxygen 3 L Cardiovascular system: S1 & S2 heard, RRR.  Gastrointestinal system: Abdomen is soft Central nervous system: Alert and awake Extremities: Scant bilateral edema Skin: No significant lesions noted Psychiatry: Flat affect.    Data Reviewed: I have personally reviewed following labs and imaging studies  CBC: Recent Labs  Lab 06/07/20 0627 06/09/20 1117 06/10/20 0644 06/11/20 0710 06/12/20 0410 06/13/20 0509   WBC 11.9* 9.6 12.6* 9.5 8.2 12.4*  NEUTROABS 8.9* 7.8*  --   --   --   --   HGB 9.6* 10.2* 10.5* 10.2* 10.4* 11.5*  HCT 29.8* 33.5* 34.2* 33.2* 33.6* 35.9*  MCV 82.8 83.8 83.4 82.0 81.2 80.1  PLT 318 413* 417* 372 364 [id number]*   Basic Metabolic Panel: Recent Labs  Lab 06/09/20 1117 06/10/20 0644 06/11/20 0710 06/12/20 0410 06/13/20 0509  NA 136 137 136 138 140  K 5.3* 4.4 4.1 3.5 3.7  CL 111 111 111 108 108  CO2 20* 21* 22 22 24   GLUCOSE 107* 126* 115* 123* 129*  BUN 52* 39* 33* 24* 20  CREATININE 1.64* 1.20 1.08 0.87 0.98  CALCIUM 9.3 9.5 9.2 8.9 9.2  MG  --   --   --   --  1.7   GFR: Estimated Creatinine Clearance: 52.1 mL/min (by C-G formula based on SCr of 0.98 mg/dL). Liver Function Tests: Recent Labs  Lab 06/07/20 0627 06/09/20 1117 06/10/20 0644 06/12/20 0410 06/13/20 0509  AST 9* 11* 9* 9* 12*  ALT 11 15 14 10 11   ALKPHOS 68 82 85 78 78  BILITOT 0.2* 0.4 0.3 0.3 0.3  PROT 4.9* 5.4* 5.2* 5.0* 5.3*  ALBUMIN 2.1* 2.3* 2.2* 1.9* 2.1*   Recent Labs  Lab 06/09/20 1117  LIPASE 27   No results for input(s): AMMONIA in the last 168 hours. Coagulation  Profile: Recent Labs  Lab 06/09/20 1117  INR 1.3*   Cardiac Enzymes: No results for input(s): CKTOTAL, CKMB, CKMBINDEX, TROPONINI in the last 168 hours. BNP (last 3 results) No results for input(s): PROBNP in the last 8760 hours. HbA1C: No results for input(s): HGBA1C in the last 72 hours. CBG: No results for input(s): GLUCAP in the last 168 hours. Lipid Profile: No results for input(s): CHOL, HDL, LDLCALC, TRIG, CHOLHDL, LDLDIRECT in the last 72 hours. Thyroid Function Tests: No results for input(s): TSH, T4TOTAL, FREET4, T3FREE, THYROIDAB in the last 72 hours. Anemia Panel: No results for input(s): VITAMINB12, FOLATE, FERRITIN, TIBC, IRON, RETICCTPCT in the last 72 hours. Sepsis Labs: Recent Labs  Lab 06/09/20 1143  LATICACIDVEN 0.9    Recent Results (from the past 240 hour(s))  Gastrointestinal  Panel by PCR , Stool     Status: None   Collection Time: 06/09/20 11:19 AM   Specimen: Stool  Result Value Ref Range Status   Campylobacter species NOT DETECTED NOT DETECTED Final   Plesimonas shigelloides NOT DETECTED NOT DETECTED Final   Salmonella species NOT DETECTED NOT DETECTED Final   Yersinia enterocolitica NOT DETECTED NOT DETECTED Final   Vibrio species NOT DETECTED NOT DETECTED Final   Vibrio cholerae NOT DETECTED NOT DETECTED Final   Enteroaggregative E coli (EAEC) NOT DETECTED NOT DETECTED Final   Enteropathogenic E coli (EPEC) NOT DETECTED NOT DETECTED Final   Enterotoxigenic E coli (ETEC) NOT DETECTED NOT DETECTED Final   Shiga like toxin producing E coli (STEC) NOT DETECTED NOT DETECTED Final   Shigella/Enteroinvasive E coli (EIEC) NOT DETECTED NOT DETECTED Final   Cryptosporidium NOT DETECTED NOT DETECTED Final   Cyclospora cayetanensis NOT DETECTED NOT DETECTED Final   Entamoeba histolytica NOT DETECTED NOT DETECTED Final   Giardia lamblia NOT DETECTED NOT DETECTED Final   Adenovirus F40/41 NOT DETECTED NOT DETECTED Final   Astrovirus NOT DETECTED NOT DETECTED Final   Norovirus GI/GII NOT DETECTED NOT DETECTED Final   Rotavirus A NOT DETECTED NOT DETECTED Final   Sapovirus (I, II, IV, and V) NOT DETECTED NOT DETECTED Final    Comment: Performed at [facility] [address] [place], [place] [address]  C Difficile Quick Screen w PCR reflex     Status: None   Collection Time: 06/09/20 11:20 AM   Specimen: STOOL  Result Value Ref Range Status   C Diff antigen NEGATIVE NEGATIVE Final   C Diff toxin NEGATIVE NEGATIVE Final   C Diff interpretation No C. difficile detected.  Final    Comment: Performed at [facility], [address] [place], [place] [address]  Resp Panel by RT-PCR (Flu A&B, Covid) Nasopharyngeal Swab     Status: None   Collection Time: 06/09/20 12:25 PM   Specimen: Nasopharyngeal Swab; Nasopharyngeal(NP) swabs in vial transport medium   Result Value Ref Range Status   SARS Coronavirus 2 by RT PCR NEGATIVE NEGATIVE Final    Comment: (NOTE) SARS-CoV-2 target nucleic acids are NOT DETECTED.  The SARS-CoV-2 RNA is generally detectable in upper respiratory specimens during the acute phase of infection. The lowest concentration of SARS-CoV-2 viral copies this assay can detect is 138 copies/mL. A negative result does not preclude SARS-Cov-2 infection and should not be used as the sole basis for treatment or other patient management decisions. A negative result may occur with  improper specimen collection/handling, submission of specimen other than nasopharyngeal swab, presence of viral mutation(s) within the areas targeted by this assay, and inadequate number  of viral copies(<138 copies/mL). A negative result must be combined with clinical observations, patient history, and epidemiological information. The expected result is Negative.  Fact Sheet for Patients:  EntrepreneurPulse.com.au  Fact Sheet for Healthcare Providers:  IncredibleEmployment.be  This test is no t yet approved or cleared by the [place] FDA and  has been authorized for detection and/or diagnosis of SARS-CoV-2 by FDA under an Emergency Use Authorization (EUA). This EUA will remain  in effect (meaning this test can be used) for the duration of the COVID-19 declaration under Section 564(b)(1) of the Act, 21 U.S.C.section 360bbb-3(b)(1), unless the authorization is terminated  or revoked sooner.       Influenza A by PCR NEGATIVE NEGATIVE Final   Influenza B by PCR NEGATIVE NEGATIVE Final    Comment: (NOTE) The Xpert Xpress SARS-CoV-2/FLU/RSV plus assay is intended as an aid in the diagnosis of influenza from Nasopharyngeal swab specimens and should not be used as a sole basis for treatment. Nasal washings and aspirates are unacceptable for Xpert Xpress SARS-CoV-2/FLU/RSV testing.  Fact Sheet for  Patients: EntrepreneurPulse.com.au  Fact Sheet for Healthcare Providers: IncredibleEmployment.be  This test is not yet approved or cleared by the [place] FDA and has been authorized for detection and/or diagnosis of SARS-CoV-2 by FDA under an Emergency Use Authorization (EUA). This EUA will remain in effect (meaning this test can be used) for the duration of the COVID-19 declaration under Section 564(b)(1) of the Act, 21 U.S.C. section 360bbb-3(b)(1), unless the authorization is terminated or revoked.  Performed at [facility] [address] [place], [place] [address]   Resp Panel by RT-PCR (Flu A&B, Covid) Nasopharyngeal Swab     Status: None   Collection Time: 06/13/20 10:06 AM   Specimen: Nasopharyngeal Swab; Nasopharyngeal(NP) swabs in vial transport medium  Result Value Ref Range Status   SARS Coronavirus 2 by RT PCR NEGATIVE NEGATIVE Final    Comment: (NOTE) SARS-CoV-2 target nucleic acids are NOT DETECTED.  The SARS-CoV-2 RNA is generally detectable in upper respiratory specimens during the acute phase of infection. The lowest concentration of SARS-CoV-2 viral copies this assay can detect is 138 copies/mL. A negative result does not preclude SARS-Cov-2 infection and should not be used as the sole basis for treatment or other patient management decisions. A negative result may occur with  improper specimen collection/handling, submission of specimen other than nasopharyngeal swab, presence of viral mutation(s) within the areas targeted by this assay, and inadequate number of viral copies(<138 copies/mL). A negative result must be combined with clinical observations, patient history, and epidemiological information. The expected result is Negative.  Fact Sheet for Patients:  EntrepreneurPulse.com.au  Fact Sheet for Healthcare Providers:  IncredibleEmployment.be  This test is no t yet approved  or cleared by the [place] FDA and  has been authorized for detection and/or diagnosis of SARS-CoV-2 by FDA under an Emergency Use Authorization (EUA). This EUA will remain  in effect (meaning this test can be used) for the duration of the COVID-19 declaration under Section 564(b)(1) of the Act, 21 U.S.C.section 360bbb-3(b)(1), unless the authorization is terminated  or revoked sooner.       Influenza A by PCR NEGATIVE NEGATIVE Final   Influenza B by PCR NEGATIVE NEGATIVE Final    Comment: (NOTE) The Xpert Xpress SARS-CoV-2/FLU/RSV plus assay is intended as an aid in the diagnosis of influenza from Nasopharyngeal swab specimens and should not be used as a sole basis for treatment. Nasal washings and aspirates are unacceptable for Xpert  Xpress SARS-CoV-2/FLU/RSV testing.  Fact Sheet for Patients: EntrepreneurPulse.com.au  Fact Sheet for Healthcare Providers: IncredibleEmployment.be  This test is not yet approved or cleared by the [place] FDA and has been authorized for detection and/or diagnosis of SARS-CoV-2 by FDA under an Emergency Use Authorization (EUA). This EUA will remain in effect (meaning this test can be used) for the duration of the COVID-19 declaration under Section 564(b)(1) of the Act, 21 U.S.C. section 360bbb-3(b)(1), unless the authorization is terminated or revoked.  Performed at [facility], [address], [place], [place] [address]          Radiology Studies: NM Hepato W/EF  Result Date: 06/12/2020 CLINICAL DATA:  Cholelithiasis.  Evaluate for biliary dyskinesia. EXAM: NUCLEAR MEDICINE HEPATOBILIARY IMAGING WITH GALLBLADDER EF TECHNIQUE: Sequential images of the abdomen were obtained out to 60 minutes following intravenous administration of radiopharmaceutical. After slow intravenous infusion of 1.7 micrograms Cholecystokinin, gallbladder ejection fraction was determined. RADIOPHARMACEUTICALS:  5.1 mCi Tc-68m  Choletec IV COMPARISON:  Right upper quadrant abdominal ultrasound-06/09/2020; CT abdomen pelvis-06/09/2020 FINDINGS: There is homogeneous distribution of injected radiotracer throughout the hepatic parenchyma. There is early opacification of the gallbladder, initially seen on the 15 minutes anterior projection planar image There is early excretion of radiotracer with opacification of the proximal small bowel, initially seen on the 20 minutes anterior projection planar image. Following the administration of CCK, there is little to no gallbladder emptying. Additionally, the patient reported mild abdominal pain during the CCK infusion. IMPRESSION: 1. No scintigraphic evidence of acute cholecystitis. 2. Findings compatible with severe biliary dyskinesia with little to no gallbladder emptying and elicitation of abdominal pain with the CCK administration. Electronically Signed   By: Sandi Mariscal M.D.   On: 06/12/2020 11:52   DG Chest Port 1 View  Result Date: 06/13/2020 CLINICAL DATA:  Hypoxemia and shortness of breath. Negative COVID test EXAM: PORTABLE CHEST 1 VIEW COMPARISON:  05/22/2020 FINDINGS: Diffuse interstitial opacity with Awanda Mink lines with possible trace pleural fluid. Stable heart size and aortic contours. Negative for air leak. IMPRESSION: CHF pattern. Electronically Signed   By: Monte Fantasia M.D.   On: 06/13/2020 10:39        Scheduled Meds: . acetaminophen  1,000 mg Oral TID  . alfuzosin  10 mg Oral Q breakfast  . aspirin  81 mg Oral Daily  . diltiazem  360 mg Oral Daily  . enoxaparin (LOVENOX) injection  40 mg Subcutaneous Q24H  . hydrALAZINE  10 mg Oral Q8H  . lisinopril  40 mg Oral Daily  . loperamide  2 mg Oral BID  . melatonin  9 mg Oral QHS  . memantine  10 mg Oral BID  . metoprolol tartrate  25 mg Oral BID  . pantoprazole  40 mg Oral QAC breakfast  . rOPINIRole  0.25 mg Oral QHS  . venlafaxine XR  150 mg Oral Q breakfast    LOS: 4 days    Time spent: 35  minutes    Miakoda Mcmillion Darleen Crocker, DO [facility]  If 7PM-7AM, please contact night-coverage [email or web address] 06/13/2020, 3:29 PM

## 2020-06-13 NOTE — Progress Notes (Signed)
Subjective: No significant abdominal pain at this time. Mild abdominal pain yesterday. Reports he had 1 BM yesterday that was watery. Last night he had a small BM that was flaky and black. No bright red blood. Overall, feels diarrhea is much better. States he was taking up to 2 imodium daily while at [facility] and was having several bowel movements daily.  Denies any presyncopal episodes at [facility].    Reports all he had to eat although yesterday was 1 bite of a hamburger with ketchup.  States he cannot eat anymore as it would cause nausea.  Denies any vomiting.  States that hamburger did not cause any abdominal pain.  He did have abdominal pain yesterday with HIDA scan.   Also notes that he is having to strain quite a bit to urinate. States it has been going on for about 1 week with associated burning with urination. 20/10 in severity.  With straining, he also has lower abdominal pain.     On 2L [place] today.  States he has never been on oxygen before and this was a started yesterday.  Yesterday he began having difficulty breathing with sensation of not being able to get a good breath.  Also with increased cough yesterday and today.  Nurse took oxygen off for a few minutes and oxygen saturation declined to 91% on room air. Also reports having some left-sided chest discomfort yesterday.  No chest pain today.  Interstitial lung disease listed in history, but patient denies this.   Eating pancakes. He has only eaten about 15-20% and states he is finished. Asked why, and he stated he just isn't hungry. Denies abdominal pain or nausea. States he just doesn't want any more.     Physical therapy helped transfer patient to recliner with assistance while I was in the room.  Small amount of stool was on the bedding.  Stool was dark brown.  Patient states this is what his stool looked like last night that he referred to as black.  Objective: Vital signs in last 24 hours: Temp:  [97.5 F (36.4 C)-99.6  F (37.6 C)] 97.5 F (36.4 C) (01/04 0508) Pulse Rate:  [86-129] 110 (01/04 0713) Resp:  [21-22] 21 (01/04 0508) BP: (143-197)/(83-111) 177/104 (01/04 0713) SpO2:  [91 %-98 %] 98 % (01/04 0713) Last BM Date: 06/11/20 General:   Alert and oriented, pleasant, chronically ill-appearing.  Nasal cannula in place on 2L.  Head:  Normocephalic and atraumatic. Eyes:  No icterus, sclera clear. Conjuctiva pink.   Lungs: Coarsened breath sounds and crackles heard throughout lung fields. Abdomen:  Bowel sounds present.  Abdomen is full but soft.  Mild to moderate tenderness to palpation in the RUQ, otherwise abdomen was nontender.  No rebound or guarding. No masses appreciated  Extremities: 1-2+ bilateral lower extremity pitting edema. Neurologic:  Alert and  oriented x4 Psych: Normal mood and affect.  Intake/Output from previous day: 01/03 0701 - 01/04 0700 In: 360 [P.O.:360] Out: -  Intake/Output this shift: No intake/output data recorded.  Lab Results: Recent Labs    06/11/20 0710 06/12/20 0410 06/13/20 0509  WBC 9.5 8.2 12.4*  HGB 10.2* 10.4* 11.5*  HCT 33.2* 33.6* 35.9*  PLT 372 364 409*   BMET Recent Labs    06/11/20 0710 06/12/20 0410 06/13/20 0509  NA 136 138 140  K 4.1 3.5 3.7  CL 111 108 108  CO2 22 22 24   GLUCOSE 115* 123* 129*  BUN 33* 24* 20  CREATININE  1.08 0.87 0.98  CALCIUM 9.2 8.9 9.2   LFT Recent Labs    06/12/20 0410 06/13/20 0509  PROT 5.0* 5.3*  ALBUMIN 1.9* 2.1*  AST 9* 12*  ALT 10 11  ALKPHOS 78 78  BILITOT 0.3 0.3    Studies/Results: NM Hepato W/EF  Result Date: 06/12/2020 CLINICAL DATA:  Cholelithiasis.  Evaluate for biliary dyskinesia. EXAM: NUCLEAR MEDICINE HEPATOBILIARY IMAGING WITH GALLBLADDER EF TECHNIQUE: Sequential images of the abdomen were obtained out to 60 minutes following intravenous administration of radiopharmaceutical. After slow intravenous infusion of 1.7 micrograms Cholecystokinin, gallbladder ejection fraction was  determined. RADIOPHARMACEUTICALS:  5.1 mCi Tc-45m Choletec IV COMPARISON:  Right upper quadrant abdominal ultrasound-06/09/2020; CT abdomen pelvis-06/09/2020 FINDINGS: There is homogeneous distribution of injected radiotracer throughout the hepatic parenchyma. There is early opacification of the gallbladder, initially seen on the 15 minutes anterior projection planar image There is early excretion of radiotracer with opacification of the proximal small bowel, initially seen on the 20 minutes anterior projection planar image. Following the administration of CCK, there is little to no gallbladder emptying. Additionally, the patient reported mild abdominal pain during the CCK infusion. IMPRESSION: 1. No scintigraphic evidence of acute cholecystitis. 2. Findings compatible with severe biliary dyskinesia with little to no gallbladder emptying and elicitation of abdominal pain with the CCK administration. Electronically Signed   By: Sandi Mariscal M.D.   On: 06/12/2020 11:52    Assessment: 87 year old male with multiple comorbidities, history of likely ischemic colitis in the setting of abdominal pain, diarrhea, and hematochezia during prior admission in Dec 2021 with CTA noting proximal stenoses of IMA but celiac and SMA remained widely patent. During that admission, C. difficile and GI pathogen panel both negative and patient was treated empirically with a course of antibiotics and improved with supportive measures. Patient presented again on 12/31 due to ongoing abdominal pain and return of diarrhea, with CT showing persistent left-sided colitis as well as gallbladder distension and haziness. Also with AKI.   Colitis: Persistent left sided colitis on CT this admission.  C diff and GI pathogen panel negative. Flex sig 06/12/2019 with diverticulosis, stool in sigmoid and descending colon.  Biopsies taken the sigmoid and descending colon along with stool sample.  No active information seen but bowel prep not ideal.  Imodium was started yesterday evening. He has received 2 doses thus far. Clinically, diarrhea has improved and lower abdominal pain has essentially resolved. Reports 1 loose/watery bowel movement yesterday.  One flaky small BM overnight.  No BM this morning.  Smear of of soft brown stool noted on bedding after patient transferred to chair today.  Will continue imodium and follow up on pathology from flex sig.   RUQ abdominal pain: Gallbladder distention and haziness on CT A/P with contrast. [place] noting gallbladder sludge with probable punctate stones, slight increased wall thickening, small amount of pericholecystic fluid.  HIDA 06/13/2019 with no evidence of acute cholecystitis, findings compatible with severe biliary dyskinesia with little to no gallbladder emptying and eliciting abdominal pain with CCK administration. Surgery consult 06/13/19 who stated patient may end up needing cholecystectomy, but no plans for inpatient procedure.  Recommended low-fat/bland diet.  Recommended outpatient follow-up in 1 month.  Today, patient reports occasional nausea after meals without any significant abdominal pain.  He does have mild to moderate RUQ tenderness to palpation on exam.  Consumed 15-20% of pancakes and grits this morning with no significant symptoms. Patient states he just does not want to eat anymore. Ongoing management per general surgery.  Suspect decreased appetite may be multifactorial in the setting of chronic disease, recent acute illness, and biliary diskinesia. Discussed with patient he will need to try to eat a little more moving forward as long as he isn't having any significant abdominal pain, nausea, or vomiting.     Shortness of breath: Patient reports new onset shortness of breath and cough yesterday requiring supplemental oxygen via nasal cannula.  Also reports an episode of left-sided chest pain yesterday.  No chest pain today. History of interstitial lung disease, but patient reports he has never  required supplemental oxygen.  Nurse removed supplemental oxygen briefly and O2 saturations declined to around 91%.  On exam, he has coarsened breath sounds and crackles throughout lung fields. Notably, WBC has increased today to 12.4 from 8.2 yesterday. T max over last 24 hours 99.6. Discussed with Dr. Sherryll Burger. Plans to order chest x-ray and COVID swab.   Dysuria: Patient reports having to strain to urinate as well as burning and sharp pain with urination x1 week. Will order UA and urine culture.   Plan: Continue scheduled imodium today. May need to decrease if no BM by later today. Do not want to cause constipation.  Follow-up on surgical pathology.  Continue soft, low fat diet as tolerated.  Encouraged patient to increase oral intake as long as he isn't having any significant nausea, vomiting, or abdominal pain.  Chest x-ray and COVID swab per Dr. Sherryll Burger UA and urine culture Continue Protonix daily.  Appreciate general surgery consult.    LOS: 4 days    06/13/2020, 8:14 AM   Ermalinda Memos, [facility]

## 2020-06-13 NOTE — Plan of Care (Signed)
  Problem: Acute Rehab PT Goals(only PT should resolve) Goal: Pt Will Go Supine/Side To Sit Outcome: Progressing Flowsheets (Taken 06/13/2020 1228) Pt will go Supine/Side to Sit: with minimal assist Goal: Patient Will Transfer Sit To/From Stand Outcome: Progressing Flowsheets (Taken 06/13/2020 1228) Patient will transfer sit to/from stand:  with minimal assist  with moderate assist Goal: Pt Will Transfer Bed To Chair/Chair To Bed Outcome: Progressing Flowsheets (Taken 06/13/2020 1228) Pt will Transfer Bed to Chair/Chair to Bed:  with min assist  with mod assist Goal: Pt Will Ambulate Outcome: Progressing Flowsheets (Taken 06/13/2020 1228) Pt will Ambulate:  25 feet  with minimal assist  with moderate assist  with rolling walker   12:29 PM, 06/13/20 Ocie Bob, MPT Physical Therapist with [facility] [phone] office [phone] mobile phone

## 2020-06-14 ENCOUNTER — Inpatient Hospital Stay
Admission: RE | Admit: 2020-06-14 | Discharge: 2020-06-28 | Disposition: A | Discharge status: Other Acute Inpatient Hospital | Payer: Medicare Other | Source: Ambulatory Visit | Attending: Internal Medicine | Admitting: Internal Medicine

## 2020-06-14 ENCOUNTER — Non-Acute Institutional Stay (SKILLED_NURSING_FACILITY): Payer: Medicare Other | Admitting: Adult Health

## 2020-06-14 ENCOUNTER — Encounter: Payer: Self-pay | Admitting: Adult Health

## 2020-06-14 DIAGNOSIS — I10 Essential (primary) hypertension: Secondary | ICD-10-CM | POA: Diagnosis not present

## 2020-06-14 DIAGNOSIS — M24572 Contracture, left ankle: Secondary | ICD-10-CM | POA: Diagnosis not present

## 2020-06-14 DIAGNOSIS — K8020 Calculus of gallbladder without cholecystitis without obstruction: Secondary | ICD-10-CM | POA: Diagnosis not present

## 2020-06-14 DIAGNOSIS — D508 Other iron deficiency anemias: Secondary | ICD-10-CM | POA: Diagnosis not present

## 2020-06-14 DIAGNOSIS — R2681 Unsteadiness on feet: Secondary | ICD-10-CM | POA: Diagnosis not present

## 2020-06-14 DIAGNOSIS — Z79899 Other long term (current) drug therapy: Secondary | ICD-10-CM | POA: Diagnosis not present

## 2020-06-14 DIAGNOSIS — K828 Other specified diseases of gallbladder: Secondary | ICD-10-CM

## 2020-06-14 DIAGNOSIS — C61 Malignant neoplasm of prostate: Secondary | ICD-10-CM

## 2020-06-14 DIAGNOSIS — Z20822 Contact with and (suspected) exposure to covid-19: Secondary | ICD-10-CM | POA: Diagnosis not present

## 2020-06-14 DIAGNOSIS — E785 Hyperlipidemia, unspecified: Secondary | ICD-10-CM | POA: Diagnosis not present

## 2020-06-14 DIAGNOSIS — J849 Interstitial pulmonary disease, unspecified: Secondary | ICD-10-CM | POA: Diagnosis not present

## 2020-06-14 DIAGNOSIS — E43 Unspecified severe protein-calorie malnutrition: Secondary | ICD-10-CM | POA: Diagnosis not present

## 2020-06-14 DIAGNOSIS — F339 Major depressive disorder, recurrent, unspecified: Secondary | ICD-10-CM

## 2020-06-14 DIAGNOSIS — N3281 Overactive bladder: Secondary | ICD-10-CM

## 2020-06-14 DIAGNOSIS — S72001A Fracture of unspecified part of neck of right femur, initial encounter for closed fracture: Secondary | ICD-10-CM | POA: Diagnosis not present

## 2020-06-14 DIAGNOSIS — D62 Acute posthemorrhagic anemia: Secondary | ICD-10-CM | POA: Diagnosis not present

## 2020-06-14 DIAGNOSIS — N289 Disorder of kidney and ureter, unspecified: Secondary | ICD-10-CM | POA: Diagnosis not present

## 2020-06-14 DIAGNOSIS — J90 Pleural effusion, not elsewhere classified: Secondary | ICD-10-CM | POA: Diagnosis not present

## 2020-06-14 DIAGNOSIS — N179 Acute kidney failure, unspecified: Secondary | ICD-10-CM

## 2020-06-14 DIAGNOSIS — K529 Noninfective gastroenteritis and colitis, unspecified: Secondary | ICD-10-CM | POA: Diagnosis not present

## 2020-06-14 DIAGNOSIS — K559 Vascular disorder of intestine, unspecified: Secondary | ICD-10-CM | POA: Diagnosis not present

## 2020-06-14 DIAGNOSIS — E876 Hypokalemia: Secondary | ICD-10-CM

## 2020-06-14 DIAGNOSIS — F0390 Unspecified dementia without behavioral disturbance: Secondary | ICD-10-CM

## 2020-06-14 DIAGNOSIS — K5909 Other constipation: Secondary | ICD-10-CM | POA: Diagnosis not present

## 2020-06-14 DIAGNOSIS — L602 Onychogryphosis: Secondary | ICD-10-CM | POA: Diagnosis not present

## 2020-06-14 DIAGNOSIS — I4891 Unspecified atrial fibrillation: Secondary | ICD-10-CM | POA: Diagnosis not present

## 2020-06-14 DIAGNOSIS — K8010 Calculus of gallbladder with chronic cholecystitis without obstruction: Secondary | ICD-10-CM | POA: Diagnosis not present

## 2020-06-14 DIAGNOSIS — I700 Atherosclerosis of aorta: Secondary | ICD-10-CM

## 2020-06-14 DIAGNOSIS — M24571 Contracture, right ankle: Secondary | ICD-10-CM | POA: Diagnosis not present

## 2020-06-14 DIAGNOSIS — K922 Gastrointestinal hemorrhage, unspecified: Secondary | ICD-10-CM | POA: Diagnosis not present

## 2020-06-14 DIAGNOSIS — R079 Chest pain, unspecified: Secondary | ICD-10-CM | POA: Diagnosis not present

## 2020-06-14 DIAGNOSIS — S72001D Fracture of unspecified part of neck of right femur, subsequent encounter for closed fracture with routine healing: Secondary | ICD-10-CM | POA: Diagnosis not present

## 2020-06-14 DIAGNOSIS — F4323 Adjustment disorder with mixed anxiety and depressed mood: Secondary | ICD-10-CM | POA: Diagnosis not present

## 2020-06-14 DIAGNOSIS — R279 Unspecified lack of coordination: Secondary | ICD-10-CM | POA: Diagnosis not present

## 2020-06-14 DIAGNOSIS — M6281 Muscle weakness (generalized): Secondary | ICD-10-CM | POA: Diagnosis not present

## 2020-06-14 DIAGNOSIS — R0602 Shortness of breath: Secondary | ICD-10-CM | POA: Diagnosis not present

## 2020-06-14 DIAGNOSIS — R011 Cardiac murmur, unspecified: Secondary | ICD-10-CM | POA: Diagnosis not present

## 2020-06-14 DIAGNOSIS — R0600 Dyspnea, unspecified: Secondary | ICD-10-CM | POA: Diagnosis not present

## 2020-06-14 DIAGNOSIS — Z1159 Encounter for screening for other viral diseases: Secondary | ICD-10-CM | POA: Diagnosis not present

## 2020-06-14 DIAGNOSIS — R1010 Upper abdominal pain, unspecified: Secondary | ICD-10-CM | POA: Diagnosis present

## 2020-06-14 DIAGNOSIS — R262 Difficulty in walking, not elsewhere classified: Secondary | ICD-10-CM | POA: Diagnosis not present

## 2020-06-14 DIAGNOSIS — G2581 Restless legs syndrome: Secondary | ICD-10-CM

## 2020-06-14 DIAGNOSIS — K219 Gastro-esophageal reflux disease without esophagitis: Secondary | ICD-10-CM | POA: Diagnosis not present

## 2020-06-14 DIAGNOSIS — I739 Peripheral vascular disease, unspecified: Secondary | ICD-10-CM | POA: Diagnosis not present

## 2020-06-14 LAB — CBC
HCT: 33.8 % — ABNORMAL LOW (ref 39.0–52.0)
Hemoglobin: 10.5 g/dL — ABNORMAL LOW (ref 13.0–17.0)
MCH: 25 pg — ABNORMAL LOW (ref 26.0–34.0)
MCHC: 31.1 g/dL (ref 30.0–36.0)
MCV: 80.5 fL (ref 80.0–100.0)
Platelets: 267 10*3/uL (ref 150–400)
RBC: 4.2 MIL/uL — ABNORMAL LOW (ref 4.22–5.81)
RDW: 15.9 % — ABNORMAL HIGH (ref 11.5–15.5)
WBC: 9 10*3/uL (ref 4.0–10.5)
nRBC: 0 % (ref 0.0–0.2)

## 2020-06-14 LAB — SARS CORONAVIRUS 2 (TAT 6-24 HRS): SARS Coronavirus 2: NEGATIVE

## 2020-06-14 LAB — BASIC METABOLIC PANEL
Anion gap: 7 (ref 5–15)
BUN: 20 mg/dL (ref 8–23)
CO2: 26 mmol/L (ref 22–32)
Calcium: 9 mg/dL (ref 8.9–10.3)
Chloride: 107 mmol/L (ref 98–111)
Creatinine, Ser: 0.97 mg/dL (ref 0.61–1.24)
GFR, Estimated: >60 mL/min (ref >60)
Glucose, Bld: 122 mg/dL — ABNORMAL HIGH (ref 70–99)
Potassium: 3.3 mmol/L — ABNORMAL LOW (ref 3.5–5.1)
Sodium: 140 mmol/L (ref 135–145)

## 2020-06-14 LAB — MAGNESIUM: Magnesium: 1.7 mg/dL (ref 1.7–2.4)

## 2020-06-14 MED ORDER — POTASSIUM CHLORIDE CRYS ER 20 MEQ PO TBCR
40.0000 meq | EXTENDED_RELEASE_TABLET | Freq: Two times a day (BID) | ORAL | Status: DC
  Administered 2020-06-14: 40 meq via ORAL
  Filled 2020-06-14: qty 2

## 2020-06-14 MED ORDER — FUROSEMIDE 10 MG/ML IJ SOLN
40.0000 mg | Freq: Once | INTRAMUSCULAR | Status: DC
  Filled 2020-06-14: qty 4

## 2020-06-14 NOTE — Progress Notes (Signed)
Subjective: Feels hard to breathe after eating. Has gas in chest, burping. RUQ discomfort after eating. Ate mashed potatoes for dinner. Hamburger was on plate. Diarrhea resolved. "checked up". One small BM yesterday. States he desires to have cholecystectomy in future.   Objective: Vital signs in last 24 hours: Temp:  [97.5 F (36.4 C)-98 F (36.7 C)] 97.6 F (36.4 C) (01/05 0539) Pulse Rate:  [76-89] 80 (01/05 0539) Resp:  [18] 18 (01/04 2159) BP: (118-141)/(60-86) 130/85 (01/05 0539) SpO2:  [94 %-98 %] 98 % (01/05 0539) Last BM Date: 06/12/20 General:   Alert and oriented, pleasant Head:  Normocephalic and atraumatic. Abdomen:  Bowel sounds present, soft, mild discomfort RUQ but without rebound or guarding Neurologic:  Alert and  oriented x4   Intake/Output from previous day: 01/04 0701 - 01/05 0700 In: 960 [P.O.:960] Out: 700 [Urine:700] Intake/Output this shift: No intake/output data recorded.  Lab Results: Recent Labs    06/12/20 0410 06/13/20 0509 06/14/20 0533  WBC 8.2 12.4* 9.0  HGB 10.4* 11.5* 10.5*  HCT 33.6* 35.9* 33.8*  PLT 364 409* 267   BMET Recent Labs    06/12/20 0410 06/13/20 0509 06/14/20 0533  NA 138 140 140  K 3.5 3.7 3.3*  CL 108 108 107  CO2 22 24 26   GLUCOSE 123* 129* 122*  BUN 24* 20 20  CREATININE 0.87 0.98 0.97  CALCIUM 8.9 9.2 9.0   LFT Recent Labs    06/12/20 0410 06/13/20 0509  PROT 5.0* 5.3*  ALBUMIN 1.9* 2.1*  AST 9* 12*  ALT 10 11  ALKPHOS 78 78  BILITOT 0.3 0.3     Studies/Results: NM Hepato W/EF  Result Date: 06/12/2020 CLINICAL DATA:  Cholelithiasis.  Evaluate for biliary dyskinesia. EXAM: NUCLEAR MEDICINE HEPATOBILIARY IMAGING WITH GALLBLADDER EF TECHNIQUE: Sequential images of the abdomen were obtained out to 60 minutes following intravenous administration of radiopharmaceutical. After slow intravenous infusion of 1.7 micrograms Cholecystokinin, gallbladder ejection fraction was determined.  RADIOPHARMACEUTICALS:  5.1 mCi Tc-38m Choletec IV COMPARISON:  Right upper quadrant abdominal ultrasound-06/09/2020; CT abdomen pelvis-06/09/2020 FINDINGS: There is homogeneous distribution of injected radiotracer throughout the hepatic parenchyma. There is early opacification of the gallbladder, initially seen on the 15 minutes anterior projection planar image There is early excretion of radiotracer with opacification of the proximal small bowel, initially seen on the 20 minutes anterior projection planar image. Following the administration of CCK, there is little to no gallbladder emptying. Additionally, the patient reported mild abdominal pain during the CCK infusion. IMPRESSION: 1. No scintigraphic evidence of acute cholecystitis. 2. Findings compatible with severe biliary dyskinesia with little to no gallbladder emptying and elicitation of abdominal pain with the CCK administration. Electronically Signed   By: 06/11/2020 M.D.   On: 06/12/2020 11:52   DG Chest Port 1 View  Result Date: 06/13/2020 CLINICAL DATA:  Hypoxemia and shortness of breath. Negative COVID test EXAM: PORTABLE CHEST 1 VIEW COMPARISON:  05/22/2020 FINDINGS: Diffuse interstitial opacity with 05/24/2020 lines with possible trace pleural fluid. Stable heart size and aortic contours. Negative for air leak. IMPRESSION: CHF pattern. Electronically Signed   By: Charyl Dancer M.D.   On: 06/13/2020 10:39    Assessment: 87 year old male with multiple comorbidities, history of likely ischemic colitis in Dec 2021 during prior admission with CTA noting proximal stenoses of IMA but celiac and SMA remained widely patent, presenting again this admission with abdominal  pain and diarrhea, with CT showing persistent left-sided colitis. Flex sig with stool in sigmoid  and descending colon, s/p biopsy with focal active colitis but no significant chronic or ischemic features. Non-specific findings. Clinically, he has improved with resolution of diarrhea but  now trending towards more sluggish bowels on Imodium scheduled. Cdiff and GI pathogen panel negative.   RUQ pain this admission with known gallstones, HIDA with severe biliary dyskinesia but findings limited in setting of acute illness, medications, etc. I do note that his diet has included fatty foods; will change this to low fat diet. Appreciate Surgery following, and he will be seen in close follow-up as outpatient.   Plan: PPI daily Decrease Imodium to once daily as needed Recommend low-fat diet, avoiding greasy/fatty foods Keep close follow-up with Surgery as outpatient Will arrange outpatient GI follow-up Patient likely to be discharged today  GI signing off   Annitta Needs, PhD, ANP-BC [facility]    LOS: 5 days    06/14/2020, 8:27 AM

## 2020-06-14 NOTE — Discharge Summary (Signed)
Physician Discharge Summary  Jeffrey Sherman [id number] DOB: 07/17/1929 DOA: 06/09/2020  PCP: Gerlene Fee, NP  Admit date: 06/09/2020 Discharge date: 06/14/2020  Admitted From: Home  Disposition:  Home   Recommendations for Outpatient Follow-up:  1. Follow up with PCP as soon as able 2. Follow up with Dr. Constance Haw in 2-4 weeks 3. Follow up with Dr. Abbey Chatters in 4-6 weeks 4. [facility]: Please schedule follow up with Dr. Constance Haw as soon as able 5. [facility]: Please restrict diet to BLAND ONLY (bread, rice, toast, crackers, applesauce, mashed potatotoes, etc., I.e. a "BRAT diet", avoiding fatty foods) 6. [facility]: Repeat BMP in 1 week to check Cr      Home Health: N/A  Equipment/Devices: TBD at SNF  Discharge Condition: Fair  CODE STATUS: DNR Diet recommendation: Criss Rosales only  Brief/Interim Summary: Mr. Gronemeyer is a 87 y.o.Mwith hx ILD on home O2, moderate dementia, wheelchair bound last 6 months, lives at [facility], prostate cancer remote, HTN and PVD as well as recent admission for colitis, presumed ischemic versus infectiouswho presented with progressive abdominal pain, nausea, diarrhea and abdominal distension at his SNF, failed IV fluids and conservative therapy.  In the ER, CT abdomen showed persistent left sided colitis but also GB distension and haziness. Creatinine 1.6. Afebrile, and without leukocytosis.      PRINCIPAL HOSPITAL DIAGNOSIS: Colitis and cholelithiasis    Discharge Diagnoses:   Likely ischemic colitis with superimposed biliary colic  Patient admitted and started on bowel rest, fluids.  GI were consulted.    Patient underwent flexible sigmoidoscopy that showed unimpressive mostly normal colon with poor prep and inability to definitively confirm ischemic appearance but no stigmata of inflammatory bowel disease or Cdiff and Cdiff plus viral/bacterial GI pathogen PCR panel were negative.  Biopsy showed focal colitis consistent with  ischemia, but nonspecific.  Patient diet was advanced and he tolerated a low fat diet adequately.   Biliary colic Patient started on antibiotics due to RUQ pain and equivocal imaging for cholecystitis on CT and [place].  Gen surg consulted.  HIDA scan showed biliary diskineesia but General Surgery thought this was NOT consistent with acute cholecystitis.  General Surgery recommend supportive care and close outpatient follow up for consideration of cholecystectomy for biliary dyskinesia, cholelithiasis.      Acute kidney injury Hyperkalemia Creatinine 1.6 on admission, resolved to normal.      Interstitial lung disease Chronic hypoxic respiratory failure Not on disease specific therapy. -Continuehome O2  Hypertension  Dementia Mild acute metabolic encephalopathy due to colitis   RLS  History prostate cancer  Hx PUD  Moderate protein calorie malnutrition              Discharge Instructions  Discharge Instructions    Discharge instructions   Complete by: As directed    You were admitted for abdominal pain and distension.  Your sigmoidoscopy showed colitis, which is not specific, but given the whole picture, Dr. Abbey Chatters believes this was probably from poor blood flow to the colon. There is no specific cure for this, but it tends to get better gradually by itself. We also think that a LOT of your symptoms of pain and nausea are from gallstones.  Dr. Constance Haw should see you in 2-4 weeks Discussed gallbladder removal with her.  For now, eat a BLAND DIET ONLY  Bland means bananas, rice, toast, saltines, crackers, graham crackers, applesauce, mashed potatoes, white bread. AVOID fatty foods until you see Dr. Constance Haw  Go see Dr. Abbey Chatters  or Dr. Laural Golden in 4 weeks   Increase activity slowly   Complete by: As directed      Allergies as of 06/14/2020   No Known Allergies     Medication List    STOP taking these medications   cloNIDine 0.1 MG  tablet Commonly known as: CATAPRES   hydrALAZINE 100 MG tablet Commonly known as: APRESOLINE   nystatin 100000 UNIT/ML suspension Commonly known as: MYCOSTATIN     TAKE these medications   acetaminophen 500 MG tablet Commonly known as: TYLENOL Take 1,000 mg by mouth in the morning, at noon, and at bedtime.   alfuzosin 10 MG 24 hr tablet Commonly known as: UROXATRAL Take 10 mg by mouth daily with breakfast.   aspirin 81 MG chewable tablet Chew 81 mg by mouth daily.   calcium carbonate 500 MG chewable tablet Commonly known as: TUMS - dosed in mg elemental calcium Chew 1 tablet by mouth 2 (two) times daily as needed for indigestion or heartburn.   diltiazem 360 MG 24 hr capsule Commonly known as: CARDIZEM CD Take 360 mg by mouth daily. Start 03/23/2020   ferrous sulfate 325 (65 FE) MG tablet Take 325 mg by mouth daily with breakfast. For Anemia and  Iron Deficiency   Fish Oil-Flax Oil-Borage Oil Caps Take 1 capsule by mouth daily.   ipratropium-albuterol 0.5-2.5 (3) MG/3ML Soln Commonly known as: DUONEB Take 3 mLs by nebulization every 6 (six) hours as needed.   lisinopril 40 MG tablet Commonly known as: ZESTRIL Take 1 tablet (40 mg total) by mouth daily.   loperamide 2 MG tablet Commonly known as: IMODIUM A-D amt: 2mg ; oral Special Instructions: after each stool do not exceed more than 4tabs in 24 hours, As Needed   Melatonin 10 MG Tabs Take 10 mg by mouth at bedtime.   memantine 10 MG tablet Commonly known as: NAMENDA Take 10 mg by mouth 2 (two) times daily.   metoprolol tartrate 25 MG tablet Commonly known as: LOPRESSOR Take 1 tablet (25 mg total) by mouth 2 (two) times daily.   NON FORMULARY Diet - Mechanical Soft   omeprazole 20 MG capsule Commonly known as: PRILOSEC Take 1 capsule (20 mg total) by mouth daily before breakfast.   ondansetron 8 MG disintegrating tablet Commonly known as: ZOFRAN-ODT Take 8 mg by mouth 3 (three) times daily before  meals. For vomiting and nausea   OXYGEN Inhale 2 L into the lungs continuous.   polyethylene glycol 17 g packet Commonly known as: MIRALAX / GLYCOLAX Take 17 g by mouth daily as needed.   potassium chloride SA 20 MEQ tablet Commonly known as: KLOR-CON Take 20 mEq by mouth 2 (two) times daily.   PRO-STAT PO liquid; 15-100 gram-kcal/30 mL; amt: 30 ml; oral Special Instructions: for albumin 2.5 With Meals   rOPINIRole 0.25 MG tablet Commonly known as: REQUIP Take 0.25 mg by mouth at bedtime.   salicylic acid 17 % gel Apply 1 application topically in the morning and at bedtime. Apply to warts to left hand   senna-docusate 8.6-50 MG tablet Commonly known as: Senokot-S Take 1 tablet by mouth at bedtime as needed for mild constipation.   terazosin 5 MG capsule Commonly known as: HYTRIN Take 5 mg by mouth at bedtime.   Venelex Oint Special Instructions: Apply to bilateral buttocks & sacral area qshift for blanchable erythema.   venlafaxine XR 150 MG 24 hr capsule Commonly known as: EFFEXOR-XR Take 150 mg by mouth daily with breakfast.  vitamin C 1000 MG tablet Take 1,000 mg by mouth daily.       Follow-up Information    Gerlene Fee, NP Follow up.   Specialty: Geriatric Medicine Contact information: [place] [address] [phone]        [name], MD. Schedule an appointment as soon as possible for a visit in 2 week(s).   Specialty: General Surgery Contact information: [address] Linna Hoff [facility] [address] [place], [place], DO. Schedule an appointment as soon as possible for a visit in 1 month(s).   Specialty: Internal Medicine Contact information: [address] [place] [address] [phone]              No Known Allergies  Consultations:  General Surgery  Gastroenterology   Procedures/Studies: CT ABDOMEN PELVIS WO CONTRAST  Result Date: 06/05/2020 CLINICAL DATA:  Diffuse  abdominal pain and diarrhea EXAM: CT ABDOMEN AND PELVIS WITHOUT CONTRAST TECHNIQUE: Multidetector CT imaging of the abdomen and pelvis was performed following the standard protocol without IV contrast. COMPARISON:  05/25/2020 FINDINGS: Lower chest: Bibasilar atelectasis/scarring. Hepatobiliary: Stable small cyst of the medial left hepatic lobe. Cholelithiasis. No biliary dilatation. Pancreas: Unremarkable Spleen: Unremarkable. Adrenals/Urinary Tract: Adrenals are unremarkable. Bilateral renal cysts. 3 mm nonobstructing calculus of the right lower pole. Small bladder diverticulum on the right. Stomach/Bowel: Stomach is within normal limits. Small bowel is normal in caliber. There is mild distention of the colon with air-fluid levels and some wall thickening along the descending colon. Distal colonic diverticulosis. Vascular/Lymphatic: Aortic atherosclerosis. No enlarged lymph nodes. Reproductive: Stable appearance of the prostate. Other: No ascites.  No acute abnormality of the abdominal wall. Musculoskeletal: Stable degenerative changes of the lumbar spine. Right hip fixation with associated streak artifact. IMPRESSION: Mild colonic distension with air-fluid levels and segmental wall thickening likely reflects colitis. Additional stable findings detailed above. Electronically Signed   By: Macy Mis M.D.   On: 06/05/2020 14:39   US Abdomen Complete  Result Date: 06/09/2020 CLINICAL DATA:  Right upper quadrant pain EXAM: ABDOMEN ULTRASOUND COMPLETE COMPARISON:  Ultrasound 03/12/2020, CT 06/09/2020 FINDINGS: Gallbladder: Gallbladder appears distended. Moderate sludge within the gallbladder lumen. Echodense foci in the sludge may reflect small stones. Slight increased wall thickness of 3.6 mm. Small amount of pericholecystic fluid. Negative sonographic Murphy. Gallbladder polyp up to 6 mm. Common bile duct: Diameter: 6 mm Liver: Within normal limits for echogenicity. No focal hepatic abnormality. Hepatic cyst  on CT not well seen on sonography. Portal vein is patent on color Doppler imaging with normal direction of blood flow towards the liver. IVC: No abnormality visualized. Pancreas: Visualized portion unremarkable. Spleen: Size and appearance within normal limits. Right Kidney: Length: 11 cm. Borderline to mild renal cortical thickening. No hydronephrosis. Multiple cysts in the right kidney, the largest is seen in the lower pole and measures 5.3 cm. Left Kidney: Length: 12 cm. Echogenicity normal. No hydronephrosis. Dominant cyst in the upper pole measuring 6.6 cm. Abdominal aorta: No aneurysm visualized. Other findings: Incidental note made of bilateral pleural effusions. IMPRESSION: 1. Moderate gallbladder sludge with probable punctate stones. Slight increased wall thickness with small amount of fluid adjacent to the gallbladder but negative sonographic Murphy. Findings are suggestive of but not definitive for cholecystitis; consider correlation with nuclear medicine hepatobiliary imaging. 6 mm gallbladder polyp. 2. Simple appearing renal cysts. 3. Incidental note made of pleural effusions. Electronically Signed   By: Donavan Foil M.D.   On:  06/09/2020 16:12   CT Abdomen Pelvis W Contrast  Result Date: 06/09/2020 CLINICAL DATA:  Abdominal distension EXAM: CT ABDOMEN AND PELVIS WITH CONTRAST TECHNIQUE: Multidetector CT imaging of the abdomen and pelvis was performed using the standard protocol following bolus administration of intravenous contrast. CONTRAST:  23mL OMNIPAQUE IOHEXOL 300 MG/ML  SOLN COMPARISON:  06/05/2020 FINDINGS: Lower chest: Small bilateral pleural effusions. Bibasilar atelectasis. Diffuse coronary artery calcifications. Heart is normal size. Hepatobiliary: Gallbladder is distended. Layering stones and possible sludge within the gallbladder. Slight indistinctness noted around the gallbladder concerning for cholecystitis. Mild periportal edema. No biliary ductal dilatation. Small cyst in the  right hepatic lobe 16 mm. Pancreas: No focal abnormality or ductal dilatation. Spleen: No focal abnormality.  Normal size. Adrenals/Urinary Tract: Bilateral renal cysts. Slight fullness of the left renal collecting system, stable since prior study. No stones or overt hydronephrosis. Urinary bladder unremarkable. Stomach/Bowel: Sigmoid and descending colonic diverticulosis. There is wall thickening noted from the distal transverse colon through the mid to distal descending colon concerning for colitis. Slight surrounding inflammation. Remainder of the colon, stomach and small bowel unremarkable. Vascular/Lymphatic: Heavily calcified aorta and iliac vessels. No aneurysm or adenopathy. Reproductive: Mildly prominent prostate. Other: No free fluid or free air. Musculoskeletal: No acute bony abnormality. IMPRESSION: Cholelithiasis. Haziness noted around the gallbladder. Cannot exclude acute cholecystitis. Continued wall thickening in the left colon from the distal transverse colon through the distal descending colon concerning for colitis. Left colonic diverticulosis. Small bilateral pleural effusions, increasing since prior study. Bibasilar atelectasis. Coronary artery disease, aortic atherosclerosis. Electronically Signed   By: Rolm Baptise M.D.   On: 06/09/2020 13:39   CT ABDOMEN PELVIS W CONTRAST  Result Date: 05/22/2020 CLINICAL DATA:  Abdominal pain, fever, rectal bleeding EXAM: CT ABDOMEN AND PELVIS WITH CONTRAST TECHNIQUE: Multidetector CT imaging of the abdomen and pelvis was performed using the standard protocol following bolus administration of intravenous contrast. CONTRAST:  122mL OMNIPAQUE IOHEXOL 300 MG/ML  SOLN COMPARISON:  May 2019 FINDINGS: Lower chest: Bibasilar atelectasis/scarring. Hepatobiliary: Small cyst of the left hepatic lobe. Few small layering gallstones. No biliary dilatation. Pancreas: Unremarkable. Spleen: Unremarkable. Adrenals/Urinary Tract: Adrenal glands are unremarkable.  Bilateral renal cysts. 3 mm nonobstructing calculus of the lower pole the right kidney. Small bladder diverticula. Stomach/Bowel: Stomach is within normal limits. Distal colonic diverticulosis. There is wall thickening along the distal transverse colon, splenic flexure, and descending colon. Vascular/Lymphatic: Aortic atherosclerosis. No enlarged lymph nodes. Reproductive: Unremarkable. Other: Small volume abdominopelvic ascites. No acute abnormality of the abdominal wall. Musculoskeletal: Right femoral fixation with associated streak artifact. Degenerative changes of the lumbar spine. No acute osseous abnormality. IMPRESSION: Findings consistent with colitis involving the distal transverse colon, splenic flexure, and descending colon. This is likely ischemic or infectious in etiology. Diverticulosis is present but mostly distal to the area of thickening and diverticulitis is therefore less likely. 3 mm nonobstructing left renal calculus. Small bladder diverticula. Electronically Signed   By: Macy Mis M.D.   On: 05/22/2020 20:12   DG Pelvis Portable  Result Date: 05/22/2020 CLINICAL DATA:  Fall, pelvic injury EXAM: PORTABLE PELVIS 1-2 VIEWS COMPARISON:  None. FINDINGS: ORIF of a right subcapital femoral neck fracture has been performed utilizing 4 partially-threaded screws with incomplete healing of the a fracture. No acute fracture or dislocation. Mild bilateral hip joint space narrowing is present in keeping with at least mild bilateral degenerative arthritis. Contrast is seen within the bladder lumen. Small bladder diverticulum present as well as trabeculation of the bladder wall  is present in keeping with changes of probable chronic bladder outlet obstruction. Defect involving the bladder base likely represents mass effect from a hypertrophied central prostatic lobe. IMPRESSION: Status post right hip ORIF.  No acute fracture or dislocation. Findings in keeping with changes of chronic bladder outlet  obstruction, possibly related to central prostatic hypertrophy. Electronically Signed   By: Fidela Salisbury MD   On: 05/22/2020 23:07   NM Hepato W/EF  Result Date: 06/12/2020 CLINICAL DATA:  Cholelithiasis.  Evaluate for biliary dyskinesia. EXAM: NUCLEAR MEDICINE HEPATOBILIARY IMAGING WITH GALLBLADDER EF TECHNIQUE: Sequential images of the abdomen were obtained out to 60 minutes following intravenous administration of radiopharmaceutical. After slow intravenous infusion of 1.7 micrograms Cholecystokinin, gallbladder ejection fraction was determined. RADIOPHARMACEUTICALS:  5.1 mCi Tc-25m Choletec IV COMPARISON:  Right upper quadrant abdominal ultrasound-06/09/2020; CT abdomen pelvis-06/09/2020 FINDINGS: There is homogeneous distribution of injected radiotracer throughout the hepatic parenchyma. There is early opacification of the gallbladder, initially seen on the 15 minutes anterior projection planar image There is early excretion of radiotracer with opacification of the proximal small bowel, initially seen on the 20 minutes anterior projection planar image. Following the administration of CCK, there is little to no gallbladder emptying. Additionally, the patient reported mild abdominal pain during the CCK infusion. IMPRESSION: 1. No scintigraphic evidence of acute cholecystitis. 2. Findings compatible with severe biliary dyskinesia with little to no gallbladder emptying and elicitation of abdominal pain with the CCK administration. Electronically Signed   By: Sandi Mariscal M.D.   On: 06/12/2020 11:52   DG Chest Port 1 View  Result Date: 06/13/2020 CLINICAL DATA:  Hypoxemia and shortness of breath. Negative COVID test EXAM: PORTABLE CHEST 1 VIEW COMPARISON:  05/22/2020 FINDINGS: Diffuse interstitial opacity with Awanda Mink lines with possible trace pleural fluid. Stable heart size and aortic contours. Negative for air leak. IMPRESSION: CHF pattern. Electronically Signed   By: Monte Fantasia M.D.   On: 06/13/2020  10:39   DG Chest Port 1 View  Result Date: 05/22/2020 CLINICAL DATA:  Questionable sepsis - evaluate for abnormality Patient reports dizziness leading to fall and abdominal pain. EXAM: PORTABLE CHEST 1 VIEW COMPARISON:  Radiograph 03/12/2020 FINDINGS: Improved cardiomegaly from prior. Unchanged mediastinal contours with aortic atherosclerosis and tortuosity. Chronic interstitial coarsening. Streaky atelectasis at the right lung base. No confluent consolidation. No pulmonary edema or large pleural effusion. No pneumothorax. No acute osseous abnormalities are seen. IMPRESSION: 1. Mild right basilar atelectasis. 2. Improved cardiomegaly from prior. Stable aortic atherosclerosis and tortuosity. Electronically Signed   By: Keith Rake M.D.   On: 05/22/2020 18:50   DG Abd Acute W/Chest  Result Date: 06/09/2020 CLINICAL DATA:  Abdominal distension. EXAM: DG ABDOMEN ACUTE WITH 1 VIEW CHEST COMPARISON:  None. FINDINGS: No small bowel dilatation is noted. Dilated and air-filled colon is noted suggesting possible ileus. No radiopaque calculi or other significant radiographic abnormality is seen. Heart size and mediastinal contours are within normal limits. Both lungs are clear. IMPRESSION: Dilated and air-filled colon is noted suggesting possible ileus. No acute cardiopulmonary disease. Electronically Signed   By: Marijo Conception M.D.   On: 06/09/2020 12:12   CT Angio Abd/Pel w/ and/or w/o  Result Date: 05/26/2020 CLINICAL DATA:  Left abdominal pain, diarrhea, suspicion of recurrent colitis, rule out mesenteric ischemia EXAM: CTA ABDOMEN AND PELVIS WITH CONTRAST TECHNIQUE: Multidetector CT imaging of the abdomen and pelvis was performed using the standard protocol during bolus administration of intravenous contrast. Multiplanar reconstructed images and MIPs were obtained and reviewed to  evaluate the vascular anatomy. CONTRAST:  173mL OMNIPAQUE IOHEXOL 350 MG/ML SOLN COMPARISON:  05/22/2020 and previous  FINDINGS: VASCULAR Aorta: Moderate calcified atheromatous plaque. No aneurysm, dissection, or stenosis. Celiac: Calcified ostial plaque without stenosis. Mild narrowing at the median arcuate ligament of the diaphragm, with minimal poststenotic dilatation, patent distally. SMA: Widely patent.  Minimal plaque.  Classic distal branch anatomy. Renals: Duplicated left, superior dominant with calcified ostial plaque, no stenosis. The inferior left renal artery is aberrant, arising below the level of the IMA origin just above the aortic bifurcation, supplying a portion of the lower pole. Duplicated right, superior dominant with calcified ostial plaque resulting in short segment stenosis of at least mild severity, patent distally. The more diminutive inferior right renal artery is aberrant arising below the level of the IMA, supplying a portion of the lower pole. IMA: Ostial stenosis of possible hemodynamic significance. Tandem heavily calcified lesion approximately 2.5 cm from the origin resulting in stenosis of probable hemodynamic significance. Inflow: Moderate calcified atheromatous plaque about the bifurcations of both common iliac arteries. No aneurysm, dissection, or stenosis. Proximal Outflow: Bilateral common femoral and visualized portions of the superficial and profunda femoral arteries are patent without evidence of aneurysm, dissection, vasculitis or significant stenosis. Veins: No obvious venous abnormality within the limitations of this arterial phase study. Retroaortic left renal vein, an anatomic variant. Review of the MIP images confirms the above findings. NON-VASCULAR Lower chest: Small bilateral pleural effusions, and dependent atelectasis/consolidation posteriorly in both lung bases, increased since previous. Hepatobiliary: Benign cyst in hepatic for segment 4B, present since 08/15/2010. No new liver lesion or biliary ductal dilatation. Vicarious excretion of contrast material into the gallbladder  obscuring the small calculi seen on the prior study. Pancreas: Unremarkable. No pancreatic ductal dilatation or surrounding inflammatory changes. Spleen: Normal in size without focal abnormality. Stable accessory splenule. Adrenals/Urinary Tract: Adrenal glands unremarkable. 6.7 cm cyst from the upper pole left kidney, slowly enlarging since 2012. 5.5 cm exophytic cyst from the mid right kidney, and 3.2 cm cyst from the right lower pole, present since 2012. 5 mm calcification in the lower pole right renal collecting system. 1 mm calcification in the left ureter at the level of the SI joint with stable mild pelvicaliectasis. Stomach/Bowel: Stomach is decompressed. Appendix not discretely identified no pericecal inflammatory/edematous change. The colon is nondilated. There is circumferential wall thickening in the distal transverse colon, splenic flexure, and descending colon. Multiple sigmoid diverticula without abscess. The rectum is nondistended. Lymphatic: Subcentimeter left para-aortic lymph node. No pelvic or mesenteric adenopathy. Reproductive: Mild prostate enlargement. Other: Small volume ascites in the lower abdomen, slightly increased since previous. No free air. Musculoskeletal: Lower lumbar spondylitic changes most marked L4-5. Orthopedic pins across the right femoral neck. No acute fracture or worrisome bone lesion. IMPRESSION: 1. Persistent colitis involving distal transverse colon, splenic flexure, and descending colon. 2. Mild lower abdominal ascites, slightly increased on a. 3. 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 The celiac axis and superior mesenteric artery remain widely patent however, which typically allows adequate collateral perfusion. 4. Increase in small bilateral pleural effusions and dependent atelectasis/consolidation in both lung bases since previous study. 5. Sigmoid diverticulosis. 6. 1 mm mid left ureteral calculus with stable left renal pelvicaliectasis. 7.  Right nephrolithiasis. 8. Bilateral renal artery ostial stenosis of possible hemodynamic significance. Aortic Atherosclerosis (ICD10-I70.0). Electronically Signed   By: Lucrezia Europe M.D.   On: 05/26/2020 08:35       Subjective: Feeling tired, appetite okay, ate scrambled  eggs this morning.  Stools better, abdominal pain still rpesent but improved.  No fever.  Discharge Exam: Vitals:   06/13/20 2159 06/14/20 0539  BP: 137/85 130/85  Pulse: 87 80  Resp: 18   Temp: 97.8 F (36.6 C) 97.6 F (36.4 C)  SpO2: 97% 98%   Vitals:   06/13/20 1442 06/13/20 1748 06/13/20 2159 06/14/20 0539  BP: 132/60 (!) 141/77 137/85 130/85  Pulse: 81 89 87 80  Resp: 18 18 18    Temp: 98 F (36.7 C) 97.6 F (36.4 C) 97.8 F (36.6 C) 97.6 F (36.4 C)  TempSrc: Oral Oral Oral Oral  SpO2: 96% 98% 97% 98%  Weight:      Height:        General: Pt is alert, awake, not in acute distress, lying in bed Cardiovascular: RRR, nl S1-S2, no murmurs appreciated.   No LE edema.   Respiratory: Normal respiratory rate and rhythm.  CTAB without rales or wheezes. Abdominal: Abdomen soft and mildly diffusely tender but no guarding or rebound.  Mild disgtension. Neuro/Psych: Strength symmetric in upper and lower extremities.  Judgment and insight appear normal.   The results of significant diagnostics from this hospitalization (including imaging, microbiology, ancillary and laboratory) are listed below for reference.     Microbiology: Recent Results (from the past 240 hour(s))  Gastrointestinal Panel by PCR , Stool     Status: None   Collection Time: 06/09/20 11:19 AM   Specimen: Stool  Result Value Ref Range Status   Campylobacter species NOT DETECTED NOT DETECTED Final   Plesimonas shigelloides NOT DETECTED NOT DETECTED Final   Salmonella species NOT DETECTED NOT DETECTED Final   Yersinia enterocolitica NOT DETECTED NOT DETECTED Final   Vibrio species NOT DETECTED NOT DETECTED Final   Vibrio cholerae NOT  DETECTED NOT DETECTED Final   Enteroaggregative E coli (EAEC) NOT DETECTED NOT DETECTED Final   Enteropathogenic E coli (EPEC) NOT DETECTED NOT DETECTED Final   Enterotoxigenic E coli (ETEC) NOT DETECTED NOT DETECTED Final   Shiga like toxin producing E coli (STEC) NOT DETECTED NOT DETECTED Final   Shigella/Enteroinvasive E coli (EIEC) NOT DETECTED NOT DETECTED Final   Cryptosporidium NOT DETECTED NOT DETECTED Final   Cyclospora cayetanensis NOT DETECTED NOT DETECTED Final   Entamoeba histolytica NOT DETECTED NOT DETECTED Final   Giardia lamblia NOT DETECTED NOT DETECTED Final   Adenovirus F40/41 NOT DETECTED NOT DETECTED Final   Astrovirus NOT DETECTED NOT DETECTED Final   Norovirus GI/GII NOT DETECTED NOT DETECTED Final   Rotavirus A NOT DETECTED NOT DETECTED Final   Sapovirus (I, II, IV, and V) NOT DETECTED NOT DETECTED Final    Comment: Performed at [facility], [address] [place], [place] [address]  C Difficile Quick Screen w PCR reflex     Status: None   Collection Time: 06/09/20 11:20 AM   Specimen: STOOL  Result Value Ref Range Status   C Diff antigen NEGATIVE NEGATIVE Final   C Diff toxin NEGATIVE NEGATIVE Final   C Diff interpretation No C. difficile detected.  Final    Comment: Performed at [facility], [address] [place], [place] [address]  Resp Panel by RT-PCR (Flu A&B, Covid) Nasopharyngeal Swab     Status: None   Collection Time: 06/09/20 12:25 PM   Specimen: Nasopharyngeal Swab; Nasopharyngeal(NP) swabs in vial transport medium  Result Value Ref Range Status   SARS Coronavirus 2 by RT PCR NEGATIVE NEGATIVE Final    Comment: (NOTE) SARS-CoV-2  target nucleic acids are NOT DETECTED.  The SARS-CoV-2 RNA is generally detectable in upper respiratory specimens during the acute phase of infection. The lowest concentration of SARS-CoV-2 viral copies this assay can detect is 138 copies/mL. A negative result does not preclude SARS-Cov-2 infection and  should not be used as the sole basis for treatment or other patient management decisions. A negative result may occur with  improper specimen collection/handling, submission of specimen other than nasopharyngeal swab, presence of viral mutation(s) within the areas targeted by this assay, and inadequate number of viral copies(<138 copies/mL). A negative result must be combined with clinical observations, patient history, and epidemiological information. The expected result is Negative.  Fact Sheet for Patients:  EntrepreneurPulse.com.au  Fact Sheet for Healthcare Providers:  IncredibleEmployment.be  This test is no t yet approved or cleared by the [place] FDA and  has been authorized for detection and/or diagnosis of SARS-CoV-2 by FDA under an Emergency Use Authorization (EUA). This EUA will remain  in effect (meaning this test can be used) for the duration of the COVID-19 declaration under Section 564(b)(1) of the Act, 21 U.S.C.section 360bbb-3(b)(1), unless the authorization is terminated  or revoked sooner.       Influenza A by PCR NEGATIVE NEGATIVE Final   Influenza B by PCR NEGATIVE NEGATIVE Final    Comment: (NOTE) The Xpert Xpress SARS-CoV-2/FLU/RSV plus assay is intended as an aid in the diagnosis of influenza from Nasopharyngeal swab specimens and should not be used as a sole basis for treatment. Nasal washings and aspirates are unacceptable for Xpert Xpress SARS-CoV-2/FLU/RSV testing.  Fact Sheet for Patients: EntrepreneurPulse.com.au  Fact Sheet for Healthcare Providers: IncredibleEmployment.be  This test is not yet approved or cleared by the [place] FDA and has been authorized for detection and/or diagnosis of SARS-CoV-2 by FDA under an Emergency Use Authorization (EUA). This EUA will remain in effect (meaning this test can be used) for the duration of the COVID-19 declaration  under Section 564(b)(1) of the Act, 21 U.S.C. section 360bbb-3(b)(1), unless the authorization is terminated or revoked.  Performed at [facility], [address], [place], [place] [address]   Resp Panel by RT-PCR (Flu A&B, Covid) Nasopharyngeal Swab     Status: None   Collection Time: 06/13/20 10:06 AM   Specimen: Nasopharyngeal Swab; Nasopharyngeal(NP) swabs in vial transport medium  Result Value Ref Range Status   SARS Coronavirus 2 by RT PCR NEGATIVE NEGATIVE Final    Comment: (NOTE) SARS-CoV-2 target nucleic acids are NOT DETECTED.  The SARS-CoV-2 RNA is generally detectable in upper respiratory specimens during the acute phase of infection. The lowest concentration of SARS-CoV-2 viral copies this assay can detect is 138 copies/mL. A negative result does not preclude SARS-Cov-2 infection and should not be used as the sole basis for treatment or other patient management decisions. A negative result may occur with  improper specimen collection/handling, submission of specimen other than nasopharyngeal swab, presence of viral mutation(s) within the areas targeted by this assay, and inadequate number of viral copies(<138 copies/mL). A negative result must be combined with clinical observations, patient history, and epidemiological information. The expected result is Negative.  Fact Sheet for Patients:  EntrepreneurPulse.com.au  Fact Sheet for Healthcare Providers:  IncredibleEmployment.be  This test is no t yet approved or cleared by the [place] FDA and  has been authorized for detection and/or diagnosis of SARS-CoV-2 by FDA under an Emergency Use Authorization (EUA). This EUA will remain  in effect (meaning this test can be  used) for the duration of the COVID-19 declaration under Section 564(b)(1) of the Act, 21 U.S.C.section 360bbb-3(b)(1), unless the authorization is terminated  or revoked sooner.       Influenza A by PCR  NEGATIVE NEGATIVE Final   Influenza B by PCR NEGATIVE NEGATIVE Final    Comment: (NOTE) The Xpert Xpress SARS-CoV-2/FLU/RSV plus assay is intended as an aid in the diagnosis of influenza from Nasopharyngeal swab specimens and should not be used as a sole basis for treatment. Nasal washings and aspirates are unacceptable for Xpert Xpress SARS-CoV-2/FLU/RSV testing.  Fact Sheet for Patients: BloggerCourse.com  Fact Sheet for Healthcare Providers: SeriousBroker.it  This test is not yet approved or cleared by the [place] FDA and has been authorized for detection and/or diagnosis of SARS-CoV-2 by FDA under an Emergency Use Authorization (EUA). This EUA will remain in effect (meaning this test can be used) for the duration of the COVID-19 declaration under Section 564(b)(1) of the Act, 21 U.S.C. section 360bbb-3(b)(1), unless the authorization is terminated or revoked.  Performed at [facility], [address], [place], [place] [address]      Labs: BNP (last 3 results) No results for input(s): BNP in the last 8760 hours. Basic Metabolic Panel: Recent Labs  Lab 06/10/20 0644 06/11/20 0710 06/12/20 0410 06/13/20 0509 06/14/20 0533  NA 137 136 138 140 140  K 4.4 4.1 3.5 3.7 3.3*  CL 111 111 108 108 107  CO2 21* 22 22 24 26   GLUCOSE 126* 115* 123* 129* 122*  BUN 39* 33* 24* 20 20  CREATININE 1.20 1.08 0.87 0.98 0.97  CALCIUM 9.5 9.2 8.9 9.2 9.0  MG  --   --   --  1.7 1.7   Liver Function Tests: Recent Labs  Lab 06/09/20 1117 06/10/20 0644 06/12/20 0410 06/13/20 0509  AST 11* 9* 9* 12*  ALT 15 14 10 11   ALKPHOS 82 85 78 78  BILITOT 0.4 0.3 0.3 0.3  PROT 5.4* 5.2* 5.0* 5.3*  ALBUMIN 2.3* 2.2* 1.9* 2.1*   Recent Labs  Lab 06/09/20 1117  LIPASE 27   No results for input(s): AMMONIA in the last 168 hours. CBC: Recent Labs  Lab 06/09/20 1117 06/10/20 0644 06/11/20 0710 06/12/20 0410 06/13/20 0509  06/14/20 0533  WBC 9.6 12.6* 9.5 8.2 12.4* 9.0  NEUTROABS 7.8*  --   --   --   --   --   HGB 10.2* 10.5* 10.2* 10.4* 11.5* 10.5*  HCT 33.5* 34.2* 33.2* 33.6* 35.9* 33.8*  MCV 83.8 83.4 82.0 81.2 80.1 80.5  PLT 413* 417* 372 364 409* 267   Cardiac Enzymes: No results for input(s): CKTOTAL, CKMB, CKMBINDEX, TROPONINI in the last 168 hours. BNP: Invalid input(s): POCBNP CBG: No results for input(s): GLUCAP in the last 168 hours. D-Dimer No results for input(s): DDIMER in the last 72 hours. Hgb A1c No results for input(s): HGBA1C in the last 72 hours. Lipid Profile No results for input(s): CHOL, HDL, LDLCALC, TRIG, CHOLHDL, LDLDIRECT in the last 72 hours. Thyroid function studies No results for input(s): TSH, T4TOTAL, T3FREE, THYROIDAB in the last 72 hours.  Invalid input(s): FREET3 Anemia work up No results for input(s): VITAMINB12, FOLATE, FERRITIN, TIBC, IRON, RETICCTPCT in the last 72 hours. Urinalysis    Component Value Date/Time   COLORURINE YELLOW 06/13/2020 2215   APPEARANCEUR HAZY (A) 06/13/2020 2215   APPEARANCEUR Clear 02/15/2020 1321   LABSPEC 1.014 06/13/2020 2215   PHURINE 5.0 06/13/2020 2215   GLUCOSEU NEGATIVE 06/13/2020  [place] 06/13/2020 [place] 06/13/2020 2215   BILIRUBINUR Negative 02/15/2020 1321   [place] 06/13/2020 2215   PROTEINUR 30 (A) 06/13/2020 2215   UROBILINOGEN negative (A) 10/19/2019 0958   UROBILINOGEN 0.2 05/08/2014 2045   NITRITE NEGATIVE 06/13/2020 2215   LEUKOCYTESUR NEGATIVE 06/13/2020 2215   Sepsis Labs Invalid input(s): PROCALCITONIN,  WBC,  LACTICIDVEN Microbiology Recent Results (from the past 240 hour(s))  Gastrointestinal Panel by PCR , Stool     Status: None   Collection Time: 06/09/20 11:19 AM   Specimen: Stool  Result Value Ref Range Status   Campylobacter species NOT DETECTED NOT DETECTED Final   Plesimonas shigelloides NOT DETECTED NOT DETECTED Final   Salmonella species  NOT DETECTED NOT DETECTED Final   Yersinia enterocolitica NOT DETECTED NOT DETECTED Final   Vibrio species NOT DETECTED NOT DETECTED Final   Vibrio cholerae NOT DETECTED NOT DETECTED Final   Enteroaggregative E coli (EAEC) NOT DETECTED NOT DETECTED Final   Enteropathogenic E coli (EPEC) NOT DETECTED NOT DETECTED Final   Enterotoxigenic E coli (ETEC) NOT DETECTED NOT DETECTED Final   Shiga like toxin producing E coli (STEC) NOT DETECTED NOT DETECTED Final   Shigella/Enteroinvasive E coli (EIEC) NOT DETECTED NOT DETECTED Final   Cryptosporidium NOT DETECTED NOT DETECTED Final   Cyclospora cayetanensis NOT DETECTED NOT DETECTED Final   Entamoeba histolytica NOT DETECTED NOT DETECTED Final   Giardia lamblia NOT DETECTED NOT DETECTED Final   Adenovirus F40/41 NOT DETECTED NOT DETECTED Final   Astrovirus NOT DETECTED NOT DETECTED Final   Norovirus GI/GII NOT DETECTED NOT DETECTED Final   Rotavirus A NOT DETECTED NOT DETECTED Final   Sapovirus (I, II, IV, and V) NOT DETECTED NOT DETECTED Final    Comment: Performed at [facility], [address] [place], [place] [address]  C Difficile Quick Screen w PCR reflex     Status: None   Collection Time: 06/09/20 11:20 AM   Specimen: STOOL  Result Value Ref Range Status   C Diff antigen NEGATIVE NEGATIVE Final   C Diff toxin NEGATIVE NEGATIVE Final   C Diff interpretation No C. difficile detected.  Final    Comment: Performed at [facility], [address] [place], [place] [address]  Resp Panel by RT-PCR (Flu A&B, Covid) Nasopharyngeal Swab     Status: None   Collection Time: 06/09/20 12:25 PM   Specimen: Nasopharyngeal Swab; Nasopharyngeal(NP) swabs in vial transport medium  Result Value Ref Range Status   SARS Coronavirus 2 by RT PCR NEGATIVE NEGATIVE Final    Comment: (NOTE) SARS-CoV-2 target nucleic acids are NOT DETECTED.  The SARS-CoV-2 RNA is generally detectable in upper respiratory specimens during the acute phase of  infection. The lowest concentration of SARS-CoV-2 viral copies this assay can detect is 138 copies/mL. A negative result does not preclude SARS-Cov-2 infection and should not be used as the sole basis for treatment or other patient management decisions. A negative result may occur with  improper specimen collection/handling, submission of specimen other than nasopharyngeal swab, presence of viral mutation(s) within the areas targeted by this assay, and inadequate number of viral copies(<138 copies/mL). A negative result must be combined with clinical observations, patient history, and epidemiological information. The expected result is Negative.  Fact Sheet for Patients:  EntrepreneurPulse.com.au  Fact Sheet for Healthcare Providers:  IncredibleEmployment.be  This test is no t yet approved or cleared by the [place] and  has been authorized for  detection and/or diagnosis of SARS-CoV-2 by FDA under an Emergency Use Authorization (EUA). This EUA will remain  in effect (meaning this test can be used) for the duration of the COVID-19 declaration under Section 564(b)(1) of the Act, 21 U.S.C.section 360bbb-3(b)(1), unless the authorization is terminated  or revoked sooner.       Influenza A by PCR NEGATIVE NEGATIVE Final   Influenza B by PCR NEGATIVE NEGATIVE Final    Comment: (NOTE) The Xpert Xpress SARS-CoV-2/FLU/RSV plus assay is intended as an aid in the diagnosis of influenza from Nasopharyngeal swab specimens and should not be used as a sole basis for treatment. Nasal washings and aspirates are unacceptable for Xpert Xpress SARS-CoV-2/FLU/RSV testing.  Fact Sheet for Patients: EntrepreneurPulse.com.au  Fact Sheet for Healthcare Providers: IncredibleEmployment.be  This test is not yet approved or cleared by the [place] FDA and has been authorized for detection and/or diagnosis of SARS-CoV-2  by FDA under an Emergency Use Authorization (EUA). This EUA will remain in effect (meaning this test can be used) for the duration of the COVID-19 declaration under Section 564(b)(1) of the Act, 21 U.S.C. section 360bbb-3(b)(1), unless the authorization is terminated or revoked.  Performed at [facility], [address], [place], [place] [address]   Resp Panel by RT-PCR (Flu A&B, Covid) Nasopharyngeal Swab     Status: None   Collection Time: 06/13/20 10:06 AM   Specimen: Nasopharyngeal Swab; Nasopharyngeal(NP) swabs in vial transport medium  Result Value Ref Range Status   SARS Coronavirus 2 by RT PCR NEGATIVE NEGATIVE Final    Comment: (NOTE) SARS-CoV-2 target nucleic acids are NOT DETECTED.  The SARS-CoV-2 RNA is generally detectable in upper respiratory specimens during the acute phase of infection. The lowest concentration of SARS-CoV-2 viral copies this assay can detect is 138 copies/mL. A negative result does not preclude SARS-Cov-2 infection and should not be used as the sole basis for treatment or other patient management decisions. A negative result may occur with  improper specimen collection/handling, submission of specimen other than nasopharyngeal swab, presence of viral mutation(s) within the areas targeted by this assay, and inadequate number of viral copies(<138 copies/mL). A negative result must be combined with clinical observations, patient history, and epidemiological information. The expected result is Negative.  Fact Sheet for Patients:  EntrepreneurPulse.com.au  Fact Sheet for Healthcare Providers:  IncredibleEmployment.be  This test is no t yet approved or cleared by the [place] FDA and  has been authorized for detection and/or diagnosis of SARS-CoV-2 by FDA under an Emergency Use Authorization (EUA). This EUA will remain  in effect (meaning this test can be used) for the duration of the COVID-19 declaration  under Section 564(b)(1) of the Act, 21 U.S.C.section 360bbb-3(b)(1), unless the authorization is terminated  or revoked sooner.       Influenza A by PCR NEGATIVE NEGATIVE Final   Influenza B by PCR NEGATIVE NEGATIVE Final    Comment: (NOTE) The Xpert Xpress SARS-CoV-2/FLU/RSV plus assay is intended as an aid in the diagnosis of influenza from Nasopharyngeal swab specimens and should not be used as a sole basis for treatment. Nasal washings and aspirates are unacceptable for Xpert Xpress SARS-CoV-2/FLU/RSV testing.  Fact Sheet for Patients: EntrepreneurPulse.com.au  Fact Sheet for Healthcare Providers: IncredibleEmployment.be  This test is not yet approved or cleared by the [place] FDA and has been authorized for detection and/or diagnosis of SARS-CoV-2 by FDA under an Emergency Use Authorization (EUA). This EUA will remain in effect (meaning this test can be  used) for the duration of the COVID-19 declaration under Section 564(b)(1) of the Act, 21 U.S.C. section 360bbb-3(b)(1), unless the authorization is terminated or revoked.  Performed at [facility], [address] [place], [place] [address]      Time coordinating discharge: 45 minutes The [place] controlled substances registry was reviewed for this patien t    SIGNED:   Edwin Dada, MD  [facility] 06/14/2020, 9:39 AM

## 2020-06-14 NOTE — Care Management Important Message (Signed)
Important Message  Patient Details  Name: Jeffrey Sherman MRN: [id number] Date of Birth: 09/01/1929   Medicare Important Message Given:  Yes     Corey Harold 06/14/2020, 1:29 PM

## 2020-06-14 NOTE — Progress Notes (Signed)
Location:  [place] Room Number: 156/D Place of Service:  SNF (31)   CODE STATUS: DNR  No Known Allergies  Chief Complaint  Patient presents with  . Hospitalization Follow-up    Hospitalization Follow Up     HPI:   Jeffrey Sherman is a 87 year old long term resident of this facility who has been hospitalized from 06-09-20 through 06-14-20. Jeffrey Sherman had recently been hospitalized for sepsis due to colitis. Jeffrey Sherman returned to the facility with continued diarrhea; nausea and poor appetite. Jeffrey Sherman failed conservative treatment here and returned back to the hospital on 06-09-30. Jeffrey Sherman was found to have ischemic colitis with superimposed biliary colic. Jeffrey Sherman had a GI consult done. Jeffrey Sherman underwent a flex sigmoidoscopy which was unimpressive and mostly a normal colon. Jeffrey Sherman had a biopsy performed which was consistent with ischemia; but not specific. His GI panel was negative along with c-diff. Jeffrey Sherman is on a bland diet and is tolerating this. His HIDA showed biliary diskinesia; but NOT consistent with acute cholecystitis. His surgical consult recommended supportive care; IG follow up for consideration of cholecystectomy for his biliary diskinesia and cholelithiasis. Jeffrey Sherman did require IVF for acute renal failure which resolved with IVF. Jeffrey Sherman tells me that Jeffrey Sherman has right upper quad pain; some nausea and weakness present. Jeffrey Sherman states Jeffrey Sherman will follow low fat bland diet. Jeffrey Sherman continues to be followed for his chronic illnesses including: Closed displaced fracture of right femoral neck:   Essential hypertension:  Gastroesophageal reflux disease without esophagitis    Past Medical History:  Diagnosis Date  . Anxiety   . Cancer [facility])    prostate  . Chronic chest wall pain   . Essential hypertension, benign 06/20/2016  . GAD (generalized anxiety disorder)   . GERD (gastroesophageal reflux disease)   . H/O echocardiogram 2016   normal  . Heart murmur    "leaking heart valve"  . High cholesterol 06/20/2016  . HOH (hard of  hearing)    left  . Hx of falling   . Hyperlipidemia   . Interstitial pulmonary disease ([place])   . MDD (major depressive disorder)   . Murmur, cardiac   . Poor historian   . PUD (peptic ulcer disease)   . PUD (peptic ulcer disease)   . RLS (restless legs syndrome)   . Shortness of breath   . Wrist fracture 12/16/2011    Past Surgical History:  Procedure Laterality Date  . COLONOSCOPY  2005   Rehman: Sigmoid colon diverticulosis, moderate sized external hemorrhoids. A few focal areas of mucosal erythema felt to be nonspecific.  Marland Kitchen ESOPHAGOGASTRODUODENOSCOPY    . ESOPHAGOGASTRODUODENOSCOPY N/A 07/18/2016   Rehman: normal exam except duodenal scar. h.pylori serologies negative  . FLEXIBLE SIGMOIDOSCOPY N/A 06/11/2020   Procedure: FLEXIBLE SIGMOIDOSCOPY;  Surgeon: Eloise Harman, DO;  Location: [facility];  Service: Endoscopy;  Laterality: N/A;  . HIP PINNING,CANNULATED Right 03/10/2020   Procedure: INTERNAL FIXATION RIGHT HIP;  Surgeon: Carole Civil, MD;  Location: [facility] ORS;  Service: Orthopedics;  Laterality: Right;  . ORIF WRIST FRACTURE  12/19/2011   Procedure: OPEN REDUCTION INTERNAL FIXATION (ORIF) WRIST FRACTURE;  Surgeon: Carole Civil, MD;  Location: [facility] ORS;  Service: Orthopedics;  Laterality: Right;  . prostate cancer     Diagnosed in the 90s  . TONSILLECTOMY      Social History   Socioeconomic History  . Marital status: Widowed    Spouse name: Not on file  . Number of children: Not on file  .  Years of education: Not on file  . Highest education level: Not on file  Occupational History  . Occupation: retired    Fish farm manager: RETIRED  Tobacco Use  . Smoking status: Never Smoker  . Smokeless tobacco: Never Used  Vaping Use  . Vaping Use: Never used  Substance and Sexual Activity  . Alcohol use: No  . Drug use: No  . Sexual activity: Not on file  Other Topics Concern  . Not on file  Social History Narrative  . Not on file   Social Determinants of Health    Financial Resource Strain: Not on file  Food Insecurity: Not on file  Transportation Needs: Not on file  Physical Activity: Not on file  Stress: Not on file  Social Connections: Not on file  Intimate Partner Violence: Not on file   Family History  Problem Relation Age of Onset  . Cancer Other   . Heart attack Mother   . Cancer Brother   . Heart attack Brother   . Heart disease Sister        by pass      VITAL SIGNS BP 130/68   Pulse 80   Temp 98.5 F (36.9 C)   Resp 20   Ht 5\' 9"  (1.753 m)   Wt 197 lb 9.6 oz (89.6 kg)   SpO2 96%   BMI 29.18 kg/m   Outpatient Encounter Medications as of 06/14/2020  Medication Sig  . acetaminophen (TYLENOL) 500 MG tablet Take 1,000 mg by mouth in the morning, at noon, and at bedtime.  Marland Kitchen alfuzosin (UROXATRAL) 10 MG 24 hr tablet Take 10 mg by mouth daily with breakfast.  . Amino Acids-Protein Hydrolys (PRO-STAT PO) liquid; 15-100 gram-kcal/30 mL; amt: 30 ml; oral Special Instructions: for albumin 2.5 With Meals  . Ascorbic Acid (VITAMIN C) 1000 MG tablet Take 1,000 mg by mouth daily.  Marland Kitchen aspirin 81 MG chewable tablet Chew 81 mg by mouth daily.  Roseanne Kaufman [place]-Castor Oil [facility]) OINT Special Instructions: Apply to bilateral buttocks & sacral area qshift for blanchable erythema.  . calcium carbonate (TUMS - DOSED IN MG ELEMENTAL CALCIUM) 500 MG chewable tablet Chew 1 tablet by mouth 2 (two) times daily as needed for indigestion or heartburn.   . diltiazem (CARDIZEM CD) 360 MG 24 hr capsule Take 360 mg by mouth daily. Start 03/23/2020  . ferrous sulfate 325 (65 FE) MG tablet Take 325 mg by mouth daily with breakfast. For Anemia and  Iron Deficiency  . Flax Oil-Fish Oil-Borage Oil (FISH OIL-FLAX OIL-BORAGE OIL) CAPS Take 1 capsule by mouth daily.  Marland Kitchen ipratropium-albuterol (DUONEB) 0.5-2.5 (3) MG/3ML SOLN Take 3 mLs by nebulization every 6 (six) hours as needed.  Marland Kitchen lisinopril (ZESTRIL) 40 MG tablet Take 1 tablet (40 mg total) by mouth daily.   Marland Kitchen loperamide (IMODIUM A-D) 2 MG tablet amt: 2mg ; oral Special Instructions: after each stool do not exceed more than 4tabs in 24 hours, As Needed  . Melatonin 10 MG TABS Take 10 mg by mouth at bedtime.  . memantine (NAMENDA) 10 MG tablet Take 10 mg by mouth 2 (two) times daily.  . metoprolol tartrate (LOPRESSOR) 25 MG tablet Take 1 tablet (25 mg total) by mouth 2 (two) times daily.  . NON FORMULARY Diet - Mechanical Soft  . omeprazole (PRILOSEC) 20 MG capsule Take 1 capsule (20 mg total) by mouth daily before breakfast.  . ondansetron (ZOFRAN-ODT) 8 MG disintegrating tablet Take 8 mg by mouth 3 (three) times daily before meals.  For vomiting and nausea  . OXYGEN Inhale 2 L into the lungs continuous.  . polyethylene glycol (MIRALAX / GLYCOLAX) 17 g packet Take 17 g by mouth daily as needed.  . potassium chloride SA (KLOR-CON) 20 MEQ tablet Take 20 mEq by mouth 2 (two) times daily.   Marland Kitchen rOPINIRole (REQUIP) 0.25 MG tablet Take 0.25 mg by mouth at bedtime.  . salicylic acid 17 % gel Apply 1 application topically in the morning and at bedtime. Apply to warts to left hand  . senna-docusate (SENOKOT-S) 8.6-50 MG tablet Take 1 tablet by mouth at bedtime as needed for mild constipation.  Marland Kitchen terazosin (HYTRIN) 5 MG capsule Take 5 mg by mouth at bedtime.   Marland Kitchen venlafaxine XR (EFFEXOR-XR) 150 MG 24 hr capsule Take 150 mg by mouth daily with breakfast.   No facility-administered encounter medications on file as of 06/14/2020.     SIGNIFICANT DIAGNOSTIC EXAMS  PREVIOUS   03-09-20: right hip x-ray: Minimally displaced right femoral neck fracture.  03-09-20: chest x-ray: Peribronchial thickening with streaky perihilar infiltrates suggesting bronchitis or multifocal pneumonia.  03-12-20: renal ultrasound: No acute abnormality. Bilateral renal cysts  03-12-20: chest x-ray: Lower lung volumes. No acute cardiopulmonary abnormality.   05-22-20: chest x-ray:  1. Mild right basilar atelectasis. 2. Improved  cardiomegaly from prior. Stable aortic atherosclerosis and tortuosity  05-22-20: ct of abdomen and pelvis:  Findings consistent with colitis involving the distal transverse colon, splenic flexure, and descending colon. This is likely ischemic or infectious in etiology. Diverticulosis is present but mostly distal to the area of thickening and diverticulitis is therefore less likely. 3 mm nonobstructing left renal calculus. Small bladder diverticula.  05-26-20: ct angio of abdomen and pelvis:  1. Persistent colitis involving distal transverse colon, splenic flexure, and descending colon. 2. Mild lower abdominal ascites, slightly increased on a. 3. 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 The celiac axis and superior mesenteric artery remain widely patent however, which typically allows adequate collateral perfusion. 4. Increase in small bilateral pleural effusions and dependent atelectasis/consolidation in both lung bases since previous study. 5. Sigmoid diverticulosis. 6. 1 mm mid left ureteral calculus with stable left renal pelvicaliectasis. 7. Right nephrolithiasis. 8. Bilateral renal artery ostial stenosis of possible hemodynamic significance.  9. Aortic Atherosclerosis  06-05-20: ct of abdomen and pelvis:  Mild colonic distension with air-fluid levels and segmental wall thickening likely reflects colitis. Additional stable findings detailed above.  TODAY   06-09-20: acute abdomen:  Dilated and air-filled colon is noted suggesting possible ileus. No acute cardiopulmonary disease  06-09-20: ct of abdomen and pelvis:  Cholelithiasis. Haziness noted around the gallbladder. Cannot exclude acute cholecystitis. Continued wall thickening in the left colon from the distal transverse colon through the distal descending colon concerning for colitis. Left colonic diverticulosis. Small bilateral pleural effusions, increasing since prior study. Bibasilar atelectasis. Coronary  artery disease, aortic atherosclerosis.  06-09-20: abdominal ultrasound:  1. Moderate gallbladder sludge with probable punctate stones. Slight increased wall thickness with small amount of fluid adjacent to the gallbladder but negative sonographic Murphy. Findings are suggestive of but not definitive for cholecystitis; consider correlation with nuclear medicine hepatobiliary imaging. 6 mm gallbladder polyp. 2. Simple appearing renal cysts. 3. Incidental note made of pleural effusions.  06-12-20: HIDA:  1. No scintigraphic evidence of acute cholecystitis. 2. Findings compatible with severe biliary dyskinesia with little to no gallbladder emptying and elicitation of abdominal pain with the CCK administration.  06-13-20: chest x-ray:  Diffuse interstitial opacity with Awanda Mink  lines with possible trace pleural fluid. Stable heart size and aortic contours. Negative for air leak.     LABS REVIEWED PREVIOUS    03-09-20: wbc 13.7; hgb 12.1; hct 37.7; mcv 80.4 plt 205; glucose 208; bun 21; creat 1.50; k+ 3.2; na++ 141; ca 9.2; hgb a1c 5.9; HIV: nr 03-10-20: wbc 11.2; hgb 11.3; hct 35.9; mcv 82.7 plt 188; glucose 160; bun 18; creat 1.21; k+ 3.3; na++ 139; ca 8.8 liver normal 3.0; mag 2.3 03-12-20: wbc 10.4; hgb 9.3; hct 29.6; mcv 84.3 plt 178; glucose 138; bun 34; creat 1.77; k+ 3.8; na++ 138; ca 8.3 03-14-20: glucose 134; bun 36; creat 1.23; k+ 3.2; na++ 139; ca 8.8 mag 2.1   03-16-20: hgb 10.7; hct 33.4 glucose 127; bun 18; creat 0.86 k+ 3.3; na++ 139; ca 9.2 ;liver normal albumin 2.5 iron 23; tibc 221; ferritin 137 03-23-20: k+ 3.0  03-27-20: glucose 133; bun 34; creat 0.85; k+ 3.6; na++ 142; ca 9.0; AM cortisol: 10.6  05-22-20: wbc 19.4; hgb 14.6; hct 43.8; mcv 81.6 plt 254; glucose 211; bun 35; creat 1.20; k+ 3.0; na++ 138; ca 9.8; liver normal albumin 3.5 mag 1.9; urine culture: <10,000; blood culture: no growth 05-25-20: wbc 14.2; hgb 20.8; hct 34.8; mcv 84.7 plt 212; glucose 109; bun 24; creat 0.86; k+  3.4; na++ 137; ca 8.9; mag 1.9 05-28-20: wbc 10.9; hgb 11.8; hct 36.8; mcv 81.8 plt 227; glucose 132; bun 10; creat 0.82; k+ 3.0; na++ 137; ca 8.7 06-01-20: wbc 12.2; hgb 12.5; hct 40.3; mcv 81.9 plt 136; glucose 136; bun 12; creat 0.81; k+ 3.0; na++ 140; ca 8.9 mag 1.9 06-05-20: wbc 13.6; hgb 10.2; hct 32.9; mcv 93.5 plt 266 glucose 125; bun 35; creat 1.26; k+ 3.5; na++ 137; ca 8.9  06-07-20: wbc 11.9; hgb 9.6; hct 29.8; mcv 82.8 plt 318; glucose 114; bun 42; creat 1.47; k+ 3.9; na++ 138; ca 8.8 GFR 46; liver normal albumin 2.1   TODAY  06-08-20: glucose 107; bun 57; creat 1.81; k+ 4.3; na++ 135; ca 8.9 GFR 36 06-09-20: glucose 107; bun 52; creat 1.64; k+ 5.3; na++ 136; ca 9.3;  GFR 40; liver normal albumin 2.3 06-10-20: wbc 12.6; hgb 10.5; hct 34.2; mcv 83.4 plt 417; glucose 126; bun 39; creat 4.4; na++ 137; ca 9.5 GFR 58; liver normal albumin 2.2 06-12-20: wbc 8.2; hgb 10.4; hct 33.6; mcv 81.2 plt 364; glucose 123; bun 24; creat 0.87; k+ 3.5; na++ 138; GFR >60; liver normal albumin 1.9 06-13-20: urine culture: <10,000 colonies 06-14-20: wbc 9.0; hgb 10.5; hct 33.8; mcv 80.5 plt 267; glucose 122; bun 20; creat 0.97; k+3.3; na++ 140; ca 9.0 GFR >60    Review of Systems  Constitutional: Positive for malaise/fatigue.  Respiratory: Negative for cough and shortness of breath.   Cardiovascular: Negative for chest pain, palpitations and leg swelling.  Gastrointestinal: Positive for abdominal pain and vomiting. Negative for constipation and heartburn.  Musculoskeletal: Negative for back pain, joint pain and myalgias.  Skin: Negative.   Neurological: Negative for dizziness.  Psychiatric/Behavioral: The patient is not nervous/anxious.     Physical Exam Constitutional:      General: Jeffrey Sherman is not in acute distress.    Appearance: Jeffrey Sherman is well-developed and well-nourished. Jeffrey Sherman is not diaphoretic.  Neck:     Thyroid: No thyromegaly.  Cardiovascular:     Rate and Rhythm: Normal rate and regular rhythm.      Pulses: Normal pulses and intact distal pulses.     Heart sounds: Normal heart  sounds.  Pulmonary:     Effort: Pulmonary effort is normal. No respiratory distress.     Breath sounds: Normal breath sounds.  Abdominal:     General: Bowel sounds are normal. There is no distension.     Palpations: Abdomen is soft.     Tenderness: There is abdominal tenderness.     Comments: Right upper quad   Musculoskeletal:        General: No edema.     Cervical back: Neck supple.     Right lower leg: No edema.     Left lower leg: No edema.     Comments: Is able to move all extremities  Status post right hip internal fixation 03-10-20   Lymphadenopathy:     Cervical: No cervical adenopathy.  Skin:    General: Skin is warm and dry.  Neurological:     Mental Status: Jeffrey Sherman is alert. Mental status is at baseline.  Psychiatric:        Mood and Affect: Mood and affect and mood normal.       ASSESSMENT/ PLAN:  TODAY  1. Ischemic colitis/ biliary dyskinesia / calculus of gall bladder without cholecystitis without obstruction: is presently stable is on bland diet; will continue to monitor his status.  Will continue zofran 8 mg prior to meals for his nausea   2. Closed displaced fracture of right femoral neck: is stable is status post right internal fixation: 03-10-20; will continue tylenol 1 gm three times daily   3. Essential hypertension: is stable b/p 130/68 will continue lopressor 25 mg twice daily cardizem cd 360 mg daily lisinopril 40 mg daily   4. Gastroesophageal reflux disease without esophagitis: is stable will continue prilosec 20 mg daily  5. Prostate cancer/overactive bladder: is stable will continue hytrin 5 mg daily; urosatral 10 mg daily is not on oxybrutin due to history fracture   6. Acute renal failure is stable bun 20 creat 0.97  7. Hypokalemia: is worse k+ 3.3 will continue k+ 20 meq twice daily and will repeat labs.   8. Dyslipidemia: is stable will continue fish oil daily is off  zocor   9. Chronic constipation: is stable will continue miralax and senna s as needed   10. Major depression recurrent chronic: is stable will continue effexor xr 150 mg daily and melatonin 10 mg nightly for sleep.   11. RLS: is stable will continue requip 0.25 mg nightly   12. Iron deficiency due to dietary intake: is stable hgb 10.5 will continue iron daily   13.  Interstitial lung disease: is stable uses 02 prn; had duoneb every 6 hours as needed  14. Dementia without behavioral disturbance unspecified dementia type: is stable will continue namenda 10 mg twice daily   15. Aortic atherosclerosis (CT 06-09-20) will monitor is on asa 81 mg daily   16. Protein calorie malnutrition, severe: is without change albumin 1.9 will continue prostat tid and supplements as directed will monitor      MD is aware of resident's narcotic use and is in agreement with current plan of care. We will attempt to wean resident as appropriate.  Ok Edwards NP [facility]  Contact [phone] Monday through Friday 8am- 5pm  After hours call [phone]

## 2020-06-14 NOTE — TOC Transition Note (Signed)
Transition of Care [facility]) - CM/SW Discharge Note   Patient Details  Name: Jeffrey Sherman MRN: [id number] Date of Birth: 08-04-1929  Transition of Care [facility] CM/SW Contact:  Annice Needy, LCSW Phone Number: 06/14/2020, 10:35 AM   Clinical Narrative:    Discharge clinicals sent to facility. Spouse notified of discharge. Patient to return to [facility] once rapid COVID test results.    Final next level of care: Skilled Nursing Facility Barriers to Discharge: No Barriers Identified   Patient Goals and CMS Choice Patient states their goals for this hospitalization and ongoing recovery are:: return to [facility]   Choice offered to / list presented to : Adult Children  Discharge Placement                       Discharge Plan and Services In-house Referral: Clinical Social Work   Post Acute Care Choice: Nursing Home                               Social Determinants of Health (SDOH) Interventions     Readmission Risk Interventions Readmission Risk Prevention Plan 06/12/2020 05/24/2020  Transportation Screening Complete Complete  Medication Review Ocean[facility]) Complete Complete  PCP or Specialist appointment within 3-5 days of discharge Complete -  HRI or Home Care Consult Complete -  SW Recovery Care/Counseling Consult Complete -  Palliative Care Screening Not Applicable -  Skilled Nursing Facility Complete Complete  Some recent data might be hidden

## 2020-06-15 ENCOUNTER — Other Ambulatory Visit (HOSPITAL_COMMUNITY)
Admission: RE | Admit: 2020-06-15 | Discharge: 2020-06-15 | Disposition: A | Discharge status: Home / Self Care | Payer: Medicare Other | Source: Skilled Nursing Facility | Attending: Adult Health | Admitting: Adult Health

## 2020-06-15 DIAGNOSIS — I10 Essential (primary) hypertension: Secondary | ICD-10-CM | POA: Diagnosis present

## 2020-06-15 DIAGNOSIS — I700 Atherosclerosis of aorta: Secondary | ICD-10-CM | POA: Insufficient documentation

## 2020-06-15 LAB — BASIC METABOLIC PANEL
Anion gap: 7 (ref 5–15)
BUN: 26 mg/dL — ABNORMAL HIGH (ref 8–23)
CO2: 26 mmol/L (ref 22–32)
Calcium: 8.7 mg/dL — ABNORMAL LOW (ref 8.9–10.3)
Chloride: 108 mmol/L (ref 98–111)
Creatinine, Ser: 0.9 mg/dL (ref 0.61–1.24)
GFR, Estimated: >60 mL/min (ref >60)
Glucose, Bld: 127 mg/dL — ABNORMAL HIGH (ref 70–99)
Potassium: 3.8 mmol/L (ref 3.5–5.1)
Sodium: 141 mmol/L (ref 135–145)

## 2020-06-15 LAB — URINE CULTURE: Culture: <10000 — AB

## 2020-06-16 ENCOUNTER — Non-Acute Institutional Stay (SKILLED_NURSING_FACILITY): Payer: Medicare Other | Admitting: Internal Medicine

## 2020-06-16 ENCOUNTER — Encounter: Payer: Self-pay | Admitting: Internal Medicine

## 2020-06-16 DIAGNOSIS — J849 Interstitial pulmonary disease, unspecified: Secondary | ICD-10-CM | POA: Diagnosis not present

## 2020-06-16 DIAGNOSIS — F339 Major depressive disorder, recurrent, unspecified: Secondary | ICD-10-CM | POA: Diagnosis not present

## 2020-06-16 DIAGNOSIS — K559 Vascular disorder of intestine, unspecified: Secondary | ICD-10-CM

## 2020-06-16 DIAGNOSIS — K8020 Calculus of gallbladder without cholecystitis without obstruction: Secondary | ICD-10-CM

## 2020-06-16 DIAGNOSIS — R079 Chest pain, unspecified: Secondary | ICD-10-CM | POA: Insufficient documentation

## 2020-06-16 NOTE — Patient Instructions (Signed)
See assessment and plan under each diagnosis in the problem list and acutely for this visit 

## 2020-06-16 NOTE — Assessment & Plan Note (Addendum)
Recurrent right upper abdominal pain reported last night following ingestion of ice cream.  Surgery follow-up with Dr. Constance Haw  06/20/2020.

## 2020-06-16 NOTE — Assessment & Plan Note (Signed)
Most recent EKG reviewed.  There are nonspecific ST-T wave changes without suggestion of ischemia.  EKG will repeated should he have recurrence of his chest pain.  Trial of nitroglycerin as needed.

## 2020-06-16 NOTE — Assessment & Plan Note (Signed)
Today he denies depression; his daughter-in-law contradicted this stating that he is chronically depressed.

## 2020-06-16 NOTE — Assessment & Plan Note (Signed)
06/15/2020 recurrent right upper quadrant pain following ingestion of ice cream.  Surgery consult 06/20/2020.

## 2020-06-16 NOTE — Progress Notes (Signed)
NURSING HOME LOCATION:  [place] ROOM NUMBER:  156/D  CODE STATUS: DNR   PCP: Gerlene Fee, NP   This is a readmisson note to [facility] performed on this date less than 30 days from date of admission. Included are preadmission medical/surgical history; reconciled medication list; family history; social history and comprehensive review of systems.  Corrections and additions to the records were documented. Comprehensive physical exam was also performed. Additionally a clinical summary was entered for each active diagnosis pertinent to this admission in the Problem List to enhance continuity of care.  HPI: Patient was rehospitalized 06/09/2020-06/14/2020 admitted from the SNF with progressive abdominal pain associated with nausea, diarrhea, and abdominal distention.  Conservative therapy and IV fluids were initiated at the SNF but symptoms progressed.   This readmission was in the context of a recent admission for colitis clinically ischemic versus infectious etiology. In the ED CT revealed persistent left-sided colitis but also gallbladder distention and haziness.  Clinically he was felt to have colitis and acute, asymptomatic cholelithiasis.  Bowel rest and parenteral fluids were initiated.   C. difficile and viral/bacterial pathogen panels were negative. Flexible sigmoidoscopy by GI revealed normal colon with poor prep without stigmata of inflammatory bowel disease.  Biopsy revealed focal colitis with lamina propria edema & mild cryptitis Non specific findings suggest self limited ( infectious ) colitis. He received antibiotics due to the right upper quadrant pain and equivocal imaging results for cholecystitis on CT and ultrasound.  HIDA scan revealed biliary dyskinesia; surgical consultant recommended supportive care with close outpatient follow-up for consideration of potential cholecystectomy for biliary dyskinesia and cholelithiasis. Course was complicated by elevated  creatinine to 1.6 which resolved to his baseline. Patient tolerated advancement of diet to low-fat. At discharge diet was restricted to bland only.  Clinical discharge diagnosis was colitis with superimposed biliary colic.  Past medical and surgical history: Includes interstitial lung disease with chronic hypoxic respiratory failure , essential hypertension, RLS, history of prostate cancer, history of peptic ulcer disease, moderate protein/caloric malnutrition, dyslipidemia, history of depression and GAD, and baseline dementia. Surgeries and procedures include  right hip pinning, and ORIF wrist fracture procedure.  Social history: Nondrinker, never smoked.  He worked for 25 years @ a [employer] and was exposed to hydrochloric acid during 1 industrial accident.  Family history: Strongly positive for heart disease and cancer.  Essentially noncontributory due to his advanced age.   Review of systems: He gave me the month and year correctly but could not give me the date.  He denied any abdominal pain until his daughter in law reminded him that he had had right upper quadrant abdominal pain last night after ingesting ice cream.  He did validate that history at that point.  His daughter-in-law called the general surgeon's office and scheduled a follow-up appointment 1/11. He stated that he had localized left anterior chest pain which was sharp and nonradiating after PT this morning. He describes intermittent shortness of breath and intermittent nonproductive cough. Neither he nor his daughter in law were aware of the interstitial lung disease diagnosis. He denied depression but his daughter-in-law contradicted this stating that he is chronically depressed.  Constitutional: No fever, significant weight change  Eyes: No redness, discharge, pain, vision change ENT/mouth: No nasal congestion, purulent discharge, earache, change in hearing, sore throat  Cardiovascular: No  palpitations,  paroxysmal nocturnal dyspnea, claudication, edema  Respiratory: No  sputum production, hemoptysis, significant snoring, apnea  Gastrointestinal: No  heartburn, dysphagia, nausea /vomiting, rectal bleeding, melena, change in bowels Genitourinary: No dysuria, hematuria, pyuria, incontinence, nocturia Musculoskeletal: No joint stiffness, joint swelling, weakness, pain Dermatologic: No rash, pruritus, change in appearance of skin Neurologic: No dizziness, headache, syncope, seizures, numbness, tingling Psychiatric: No  insomnia Endocrine: No change in hair/skin/nails, excessive thirst, excessive hunger, excessive urination  Hematologic/lymphatic: No significant bruising, lymphadenopathy, abnormal bleeding Allergy/immunology: No itchy/watery eyes, significant sneezing, urticaria, angioedema  Physical exam:  Pertinent or positive findings: Affect is flat.  There is pattern alopecia.  He has only a few anterior mandibular teeth remaining and is otherwise edentulous.  He exhibits dry rales homogenously in all lung fields.  Heart sounds are markedly distant, heard best in the epigastrium; rhythm is slightly irregular.  Abdomen is protuberant.  He has minimal tenderness to palpation of the right upper quadrant.  Posterior tibial pulses are stronger than the dorsalis pedis pulses.  He has trace edema at the sock line but there is pitting over the ankles and feet.  Upper extremities appear weaker to opposition than the lower extremities.  He has faint bruising over the dorsum of the hands, greater on the right.  General appearance: no distress or increased work of breathing is present.   Lymphatic: No lymphadenopathy about the head, neck, axilla. Eyes: No conjunctival inflammation or lid edema is present. There is no scleral icterus. Ears:  External ear exam shows no significant lesions or deformities.   Nose:  External nasal examination shows no deformity or inflammation. Nasal mucosa are pink and moist  without lesions, exudates Oral exam: Lips and gums are healthy appearing.There is no oropharyngeal erythema or exudate. Neck:  No thyromegaly, masses, tenderness noted.    Heart:  No gallop, murmur, click, rub.  Lungs:  without wheezes, rhonchi,  rubs. Abdomen: Bowel sounds are normal.  Abdomen is soft and nontender with no organomegaly, hernias, masses. GU: Deferred  Extremities:  No cyanosis, clubbing. Neurologic exam:  Balance, Rhomberg, finger to nose testing could not be completed due to clinical state Skin: Warm & dry w/o tenting. No significant  rash.  See clinical summary under each active problem in the Problem List with associated updated therapeutic plan

## 2020-06-16 NOTE — Assessment & Plan Note (Signed)
On exam he exhibits diffuse homogenous rales.  Chest x-ray did show increased interstitial markings. He would need pulmonary clearance if cholecystectomy is pursued.

## 2020-06-19 ENCOUNTER — Non-Acute Institutional Stay (SKILLED_NURSING_FACILITY): Payer: Medicare Other | Admitting: Adult Health

## 2020-06-19 ENCOUNTER — Encounter: Payer: Self-pay | Admitting: Adult Health

## 2020-06-19 DIAGNOSIS — F339 Major depressive disorder, recurrent, unspecified: Secondary | ICD-10-CM

## 2020-06-19 NOTE — Progress Notes (Signed)
Location:  [place] Room Number: 156/D Place of Service:  SNF (31)   CODE STATUS: DNR  No Known Allergies  Chief Complaint  Patient presents with  . Acute Visit    Patient Request     HPI:  He has told stff members today that he would like to die if he does not start feeling any better. He denies nay suicidal ideation. He is upset and frustrated that he is not feeling any better. He is presently taking effexor 150 mg daily. He states that he has felt this way for the past several days. He is wanting to increase his effexor to 225 mg daily. He was incontinent of stool today due to diarrhea. He feels ashamed by this.  We have discussed his code status: we have filled out his MOST form. He does not want cpr or a tube feeding. He is willing to have abt and ivf as needed and is willing to be hospitalized but does not want ICU care.   Past Medical History:  Diagnosis Date  . Anxiety   . Cancer [facility])    prostate  . Chronic chest wall pain   . Essential hypertension, benign 06/20/2016  . GAD (generalized anxiety disorder)   . GERD (gastroesophageal reflux disease)   . H/O echocardiogram 2016   normal  . Heart murmur    "leaking heart valve"  . High cholesterol 06/20/2016  . HOH (hard of hearing)    left  . Hx of falling   . Hyperlipidemia   . Interstitial pulmonary disease ([place])   . MDD (major depressive disorder)   . Murmur, cardiac   . Poor historian   . PUD (peptic ulcer disease)   . RLS (restless legs syndrome)   . Shortness of breath   . Wrist fracture 12/16/2011    Past Surgical History:  Procedure Laterality Date  . COLONOSCOPY  2005   Rehman: Sigmoid colon diverticulosis, moderate sized external hemorrhoids. A few focal areas of mucosal erythema felt to be nonspecific.  Marland Kitchen ESOPHAGOGASTRODUODENOSCOPY    . ESOPHAGOGASTRODUODENOSCOPY N/A 07/18/2016   Rehman: normal exam except duodenal scar. h.pylori serologies negative  . FLEXIBLE SIGMOIDOSCOPY  N/A 06/11/2020   Procedure: FLEXIBLE SIGMOIDOSCOPY;  Surgeon: Eloise Harman, DO;  Location: [facility] ENDO SUITE;  Service: Endoscopy;  Laterality: N/A;  . HIP PINNING,CANNULATED Right 03/10/2020   Procedure: INTERNAL FIXATION RIGHT HIP;  Surgeon: Carole Civil, MD;  Location: [facility] ORS;  Service: Orthopedics;  Laterality: Right;  . ORIF WRIST FRACTURE  12/19/2011   Procedure: OPEN REDUCTION INTERNAL FIXATION (ORIF) WRIST FRACTURE;  Surgeon: Carole Civil, MD;  Location: [facility] ORS;  Service: Orthopedics;  Laterality: Right;  . prostate cancer     Diagnosed in the 90s  . TONSILLECTOMY      Social History   Socioeconomic History  . Marital status: Widowed    Spouse name: Not on file  . Number of children: Not on file  . Years of education: Not on file  . Highest education level: Not on file  Occupational History  . Occupation: retired    Fish farm manager: RETIRED  Tobacco Use  . Smoking status: Never Smoker  . Smokeless tobacco: Never Used  Vaping Use  . Vaping Use: Never used  Substance and Sexual Activity  . Alcohol use: No  . Drug use: No  . Sexual activity: Not on file  Other Topics Concern  . Not on file  Social History Narrative  . Not on  file   Social Determinants of Health   Financial Resource Strain: Not on file  Food Insecurity: Not on file  Transportation Needs: Not on file  Physical Activity: Not on file  Stress: Not on file  Social Connections: Not on file  Intimate Partner Violence: Not on file   Family History  Problem Relation Age of Onset  . Cancer Other   . Heart attack Mother   . Cancer Brother   . Heart attack Brother   . Heart disease Sister        by pass      VITAL SIGNS BP 140/72   Pulse 84   Temp (!) 97.1 F (36.2 C)   Resp 20   Ht 5\' 9"  (1.753 m)   Wt 202 lb 3.2 oz (91.7 kg)   SpO2 95%   BMI 29.86 kg/m   Outpatient Encounter Medications as of 06/19/2020  Medication Sig  . acetaminophen (TYLENOL) 500 MG tablet Take 1,000 mg by mouth  in the morning, at noon, and at bedtime.  Marland Kitchen alfuzosin (UROXATRAL) 10 MG 24 hr tablet Take 10 mg by mouth daily with breakfast.  . Amino Acids-Protein Hydrolys (PRO-STAT PO) liquid; 15-100 gram-kcal/30 mL; amt: 30 ml; oral Special Instructions: for albumin 2.5 With Meals  . Ascorbic Acid (VITAMIN C) 1000 MG tablet Take 1,000 mg by mouth daily.  Marland Kitchen aspirin 81 MG chewable tablet Chew 81 mg by mouth daily.  Roseanne Kaufman [place]-Castor Oil [facility]) OINT Special Instructions: Apply to bilateral buttocks & sacral area qshift for blanchable erythema.  . calcium carbonate (TUMS - DOSED IN MG ELEMENTAL CALCIUM) 500 MG chewable tablet Chew 1 tablet by mouth 2 (two) times daily as needed for indigestion or heartburn.   . diltiazem (CARDIZEM CD) 360 MG 24 hr capsule Take 360 mg by mouth daily. Start 03/23/2020  . ferrous sulfate 325 (65 FE) MG tablet Take 325 mg by mouth daily with breakfast. For Anemia and  Iron Deficiency  . Flax Oil-Fish Oil-Borage Oil (FISH OIL-FLAX OIL-BORAGE OIL) CAPS Take 1 capsule by mouth daily.  Marland Kitchen ipratropium-albuterol (DUONEB) 0.5-2.5 (3) MG/3ML SOLN Take 3 mLs by nebulization every 6 (six) hours as needed.  Marland Kitchen lisinopril (ZESTRIL) 40 MG tablet Take 1 tablet (40 mg total) by mouth daily.  Marland Kitchen loperamide (IMODIUM A-D) 2 MG tablet amt: 2mg ; oral Special Instructions: after each stool do not exceed more than 4tabs in 24 hours, As Needed  . Melatonin 10 MG TABS Take 10 mg by mouth at bedtime.  . memantine (NAMENDA) 10 MG tablet Take 10 mg by mouth 2 (two) times daily.  . metoprolol tartrate (LOPRESSOR) 25 MG tablet Take 1 tablet (25 mg total) by mouth 2 (two) times daily.  . NON FORMULARY Diet - Mechanical Soft _ Bland diet only. (EX: Bread, rice, toast, crackers, applesauce, mashed potatoes, BRAT diet, avoid fatty foods)  . omeprazole (PRILOSEC) 20 MG capsule Take 1 capsule (20 mg total) by mouth daily before breakfast.  . ondansetron (ZOFRAN-ODT) 8 MG disintegrating tablet Take 8 mg by mouth  3 (three) times daily before meals. For vomiting and nausea  . polyethylene glycol (MIRALAX / GLYCOLAX) 17 g packet Take 17 g by mouth daily as needed.  . potassium chloride SA (KLOR-CON) 20 MEQ tablet Take 20 mEq by mouth 2 (two) times daily.   Marland Kitchen rOPINIRole (REQUIP) 0.25 MG tablet Take 0.25 mg by mouth at bedtime.  . salicylic acid 17 % gel Apply 1 application topically in the morning and at  bedtime. Apply to warts to left hand  . senna-docusate (SENOKOT-S) 8.6-50 MG tablet Take 1 tablet by mouth at bedtime as needed for mild constipation.  Marland Kitchen terazosin (HYTRIN) 5 MG capsule Take 5 mg by mouth at bedtime.   Marland Kitchen venlafaxine XR (EFFEXOR-XR) 150 MG 24 hr capsule Take 150 mg by mouth daily with breakfast.   No facility-administered encounter medications on file as of 06/19/2020.     SIGNIFICANT DIAGNOSTIC EXAMS  PREVIOUS   03-09-20: right hip x-ray: Minimally displaced right femoral neck fracture.  03-09-20: chest x-ray: Peribronchial thickening with streaky perihilar infiltrates suggesting bronchitis or multifocal pneumonia.  03-12-20: renal ultrasound: No acute abnormality. Bilateral renal cysts  03-12-20: chest x-ray: Lower lung volumes. No acute cardiopulmonary abnormality.   05-22-20: chest x-ray:  1. Mild right basilar atelectasis. 2. Improved cardiomegaly from prior. Stable aortic atherosclerosis and tortuosity  05-22-20: ct of abdomen and pelvis:  Findings consistent with colitis involving the distal transverse colon, splenic flexure, and descending colon. This is likely ischemic or infectious in etiology. Diverticulosis is present but mostly distal to the area of thickening and diverticulitis is therefore less likely. 3 mm nonobstructing left renal calculus. Small bladder diverticula.  05-26-20: ct angio of abdomen and pelvis:  1. Persistent colitis involving distal transverse colon, splenic flexure, and descending colon. 2. Mild lower abdominal ascites, slightly increased on a. 3.  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 The celiac axis and superior mesenteric artery remain widely patent however, which typically allows adequate collateral perfusion. 4. Increase in small bilateral pleural effusions and dependent atelectasis/consolidation in both lung bases since previous study. 5. Sigmoid diverticulosis. 6. 1 mm mid left ureteral calculus with stable left renal pelvicaliectasis. 7. Right nephrolithiasis. 8. Bilateral renal artery ostial stenosis of possible hemodynamic significance.  9. Aortic Atherosclerosis  06-05-20: ct of abdomen and pelvis:  Mild colonic distension with air-fluid levels and segmental wall thickening likely reflects colitis. Additional stable findings detailed above.  06-09-20: acute abdomen:  Dilated and air-filled colon is noted suggesting possible ileus. No acute cardiopulmonary disease  06-09-20: ct of abdomen and pelvis:  Cholelithiasis. Haziness noted around the gallbladder. Cannot exclude acute cholecystitis. Continued wall thickening in the left colon from the distal transverse colon through the distal descending colon concerning for colitis. Left colonic diverticulosis. Small bilateral pleural effusions, increasing since prior study. Bibasilar atelectasis. Coronary artery disease, aortic atherosclerosis.  06-09-20: abdominal ultrasound:  1. Moderate gallbladder sludge with probable punctate stones. Slight increased wall thickness with small amount of fluid adjacent to the gallbladder but negative sonographic Murphy. Findings are suggestive of but not definitive for cholecystitis; consider correlation with nuclear medicine hepatobiliary imaging. 6 mm gallbladder polyp. 2. Simple appearing renal cysts. 3. Incidental note made of pleural effusions.  06-12-20: HIDA:  1. No scintigraphic evidence of acute cholecystitis. 2. Findings compatible with severe biliary dyskinesia with little to no gallbladder emptying and  elicitation of abdominal pain with the CCK administration.  06-13-20: chest x-ray:  Diffuse interstitial opacity with Awanda Mink lines with possible trace pleural fluid. Stable heart size and aortic contours. Negative for air leak  NO NEW EXAMS.      LABS REVIEWED PREVIOUS    03-09-20: wbc 13.7; hgb 12.1; hct 37.7; mcv 80.4 plt 205; glucose 208; bun 21; creat 1.50; k+ 3.2; na++ 141; ca 9.2; hgb a1c 5.9; HIV: nr 03-10-20: wbc 11.2; hgb 11.3; hct 35.9; mcv 82.7 plt 188; glucose 160; bun 18; creat 1.21; k+ 3.3; na++ 139; ca 8.8 liver normal  3.0; mag 2.3 03-12-20: wbc 10.4; hgb 9.3; hct 29.6; mcv 84.3 plt 178; glucose 138; bun 34; creat 1.77; k+ 3.8; na++ 138; ca 8.3 03-14-20: glucose 134; bun 36; creat 1.23; k+ 3.2; na++ 139; ca 8.8 mag 2.1   03-16-20: hgb 10.7; hct 33.4 glucose 127; bun 18; creat 0.86 k+ 3.3; na++ 139; ca 9.2 ;liver normal albumin 2.5 iron 23; tibc 221; ferritin 137 03-23-20: k+ 3.0  03-27-20: glucose 133; bun 34; creat 0.85; k+ 3.6; na++ 142; ca 9.0; AM cortisol: 10.6  05-22-20: wbc 19.4; hgb 14.6; hct 43.8; mcv 81.6 plt 254; glucose 211; bun 35; creat 1.20; k+ 3.0; na++ 138; ca 9.8; liver normal albumin 3.5 mag 1.9; urine culture: <10,000; blood culture: no growth 05-25-20: wbc 14.2; hgb 20.8; hct 34.8; mcv 84.7 plt 212; glucose 109; bun 24; creat 0.86; k+ 3.4; na++ 137; ca 8.9; mag 1.9 05-28-20: wbc 10.9; hgb 11.8; hct 36.8; mcv 81.8 plt 227; glucose 132; bun 10; creat 0.82; k+ 3.0; na++ 137; ca 8.7 06-01-20: wbc 12.2; hgb 12.5; hct 40.3; mcv 81.9 plt 136; glucose 136; bun 12; creat 0.81; k+ 3.0; na++ 140; ca 8.9 mag 1.9 06-05-20: wbc 13.6; hgb 10.2; hct 32.9; mcv 93.5 plt 266 glucose 125; bun 35; creat 1.26; k+ 3.5; na++ 137; ca 8.9  06-07-20: wbc 11.9; hgb 9.6; hct 29.8; mcv 82.8 plt 318; glucose 114; bun 42; creat 1.47; k+ 3.9; na++ 138; ca 8.8 GFR 46; liver normal albumin 2.1  06-08-20: glucose 107; bun 57; creat 1.81; k+ 4.3; na++ 135; ca 8.9 GFR 36 06-09-20: glucose 107; bun 52;  creat 1.64; k+ 5.3; na++ 136; ca 9.3;  GFR 40; liver normal albumin 2.3 06-10-20: wbc 12.6; hgb 10.5; hct 34.2; mcv 83.4 plt 417; glucose 126; bun 39; creat 4.4; na++ 137; ca 9.5 GFR 58; liver normal albumin 2.2 06-12-20: wbc 8.2; hgb 10.4; hct 33.6; mcv 81.2 plt 364; glucose 123; bun 24; creat 0.87; k+ 3.5; na++ 138; GFR >60; liver normal albumin 1.9 06-13-20: urine culture: <10,000 colonies 06-14-20: wbc 9.0; hgb 10.5; hct 33.8; mcv 80.5 plt 267; glucose 122; bun 20; creat 0.97; k+3.3; na++ 140; ca 9.0 GFR >60  NO NEW LABS.   Review of Systems  Constitutional: Negative for malaise/fatigue.  Respiratory: Negative for cough and shortness of breath.   Cardiovascular: Negative for chest pain, palpitations and leg swelling.  Gastrointestinal: Positive for diarrhea. Negative for abdominal pain and heartburn.  Musculoskeletal: Negative for back pain, joint pain and myalgias.  Skin: Negative.   Neurological: Negative for dizziness.  Psychiatric/Behavioral: Positive for depression. Negative for suicidal ideas. The patient is not nervous/anxious.     Physical Exam Constitutional:      General: He is not in acute distress.    Appearance: He is well-developed and well-nourished. He is not diaphoretic.  Neck:     Thyroid: No thyromegaly.  Cardiovascular:     Rate and Rhythm: Normal rate and regular rhythm.     Pulses: Normal pulses and intact distal pulses.     Heart sounds: Normal heart sounds.  Pulmonary:     Effort: Pulmonary effort is normal. No respiratory distress.     Breath sounds: Normal breath sounds.  Abdominal:     General: Bowel sounds are normal. There is no distension.     Palpations: Abdomen is soft.     Tenderness: There is abdominal tenderness.  Musculoskeletal:        General: No edema.     Cervical back: Neck  supple.     Right lower leg: No edema.     Left lower leg: No edema.     Comments:  Is able to move all extremities  Status post right hip internal fixation 03-10-20     Lymphadenopathy:     Cervical: No cervical adenopathy.  Skin:    General: Skin is warm and dry.  Neurological:     Mental Status: He is alert. Mental status is at baseline.  Psychiatric:        Mood and Affect: Mood and affect and mood normal.       ASSESSMENT/ PLAN:  TODAY  1. Major depression recurrent chronic  Is worse will increase effexor to 225 mg daily   MOST form filled out: DNR; no tube feeding: no ICU; ok for abt and ivf.   Time spent with patient: 60 spent with discussing his advanced directives and depression. He has verbalized understanding.   MD is aware of resident's narcotic use and is in agreement with current plan of care. We will attempt to wean resident as appropriate.  Ok Edwards NP [facility]  Contact [phone] Monday through Friday 8am- 5pm  After hours call [phone]

## 2020-06-20 ENCOUNTER — Encounter: Payer: Self-pay | Admitting: General Surgery

## 2020-06-20 ENCOUNTER — Ambulatory Visit (INDEPENDENT_AMBULATORY_CARE_PROVIDER_SITE_OTHER): Payer: Medicare Other | Admitting: General Surgery

## 2020-06-20 VITALS — BP 157/76 | HR 70 | Temp 97.9°F | Resp 12 | Ht 69.0 in | Wt 197.0 lb

## 2020-06-20 DIAGNOSIS — K828 Other specified diseases of gallbladder: Secondary | ICD-10-CM

## 2020-06-20 NOTE — Patient Instructions (Signed)
Biliary Dyskinesia  Biliary dyskinesia is a condition in which the gallbladder or bile ducts cannot release or move bile normally. Bile is a fluid that is made in the liver to help the body digest food. Bile flows to the gallbladder to be stored. When bile is needed for digestion, it leaves the gallbladder and flows through the bile ducts into the digestive tract. Biliary dyskinesia causes bile to build up, and that can cause pain in the abdomen. This condition may also be called:  Acalculous cholecystopathy.  Functional gallbladder disorder.  Sphincter of Oddi dysfunction. This is one type of biliary dyskinesia. What are the causes? The cause of biliary dyskinesia is poor function of the gallbladder. The exact reason this happens is often unknown. One reason may be changes in the gallbladder that are caused by obesity. What increases the risk? A person is more likely to develop this condition if his or her mother or father had it. It may also develop if the person is:  Overweight.  Male.  26-70 years old. What are the signs or symptoms? The main symptom of this condition is pain in the upper right side of the abdomen. Typically, the pain:  Starts about 30 minutes after a meal, especially a meal that is spicy or greasy.  Lasts for 30 minutes or longer.  Builds up gradually until it is a steady pain that is severe enough to interrupt daily activities. Other symptoms may include:  Nausea or vomiting.  Sweating.  Cramping or bloating in the abdomen.  Heartburn or belching.  Diarrhea. How is this diagnosed? This condition may be diagnosed based on your symptoms, your medical history, and a physical exam. You may have tests to rule out other conditions and to confirm the diagnosis. Tests may include:  Blood tests.  Ultrasound tests of the gallbladder.  Hepatobiliary iminodiacetic acid (HIDA) scan. This is an X-ray test that can show if your gallbladder empties less than a  normal amount of bile (gallbladder ejection fraction).  MRI or CT scan of the abdomen.  ERCP (endoscopic retrograde cholangiopancreatogram). During this procedure, a thin tube with a camera on the end is inserted into the throat and down into areas that need to be examined, such as the pancreas, bile ducts, liver, and gallbladder. Dye is injected into your blood, and then X-rays are done. The dye helps your health care provider see the areas to be examined. ERCP may be done to help diagnose sphincter of Oddi dysfunction. How is this treated? Treatment depends on the cause. Usually, the first step of treatment is to make lifestyle changes. Your health care provider may recommend:  Resting.  Losing weight.  Avoiding foods that are spicy, greasy, or fatty.  Taking over-the-counter or prescription pain medicine. In some cases, the condition gets better with lifestyle changes only. However, in many cases, surgery to remove the gallbladder (cholecystectomy) is needed. Follow these instructions at home: Medicines  Take over-the-counter and prescription medicines only as told by your health care provider.  Ask your health care provider if the medicine prescribed to you: ? Requires you to avoid driving or using machinery. ? Can cause constipation. You may need to take these actions to prevent or treat constipation:  Drink enough fluid to keep your urine pale yellow.  Take over-the-counter or prescription medicines.  Eat foods that are high in fiber, such as beans, whole grains, and fresh fruits and vegetables.  Limit foods that are high in fat and processed sugars, such as  fried or sweet foods. Alcohol use Alcohol can irritate your stomach and your liver. If you drink alcohol:  Limit how much you have to: ? 0-1 drink a day for women who are not pregnant. ? 0-2 drinks a day for men.  Know how much alcohol is in a drink. In the [place] one drink equals one 12 oz bottle of beer (355 mL), one  5 oz glass of wine (148 mL), or one 1 oz glass of hard liquor (44 mL). General instructions  Rest and return to your normal activities as told by your health care provider. Ask your health care provider what activities are safe for you.  Follow instructions from your health care provider about eating or drinking restrictions. You may need to limit fatty, greasy, and spicy foods if they cause symptoms.  Do not use any products that contain nicotine or tobacco. These products include cigarettes, chewing tobacco, and vaping devices, such as e-cigarettes. If you need help quitting, ask your health care provider. Smoking can damage your digestive system.  Keep all follow-up visits. This is important. Contact a health care provider if:  Pain in your abdomen returns.  Your symptoms become more severe or continue for more than 3 months.  You have any of the following: ? Nausea or vomiting. ? Diarrhea. ? Cramping or bloating in your abdomen. Summary  Biliary dyskinesia is a condition in which your gallbladder or bile ducts cannot release or move bile normally.  The main symptom of this condition is pain in the upper right side of the abdomen. The pain typically starts about 30 minutes after a meal, especially a meal that is spicy or greasy.  Treatment depends on the cause and usually involves lifestyle changes, such as working to lose weight and avoiding certain foods. In many cases, surgery to remove the gallbladder (cholecystectomy) is needed. This information is not intended to replace advice given to you by your health care provider. Make sure you discuss any questions you have with your health care provider. Document Revised: 03/22/2020 Document Reviewed: 03/22/2020 Elsevier Patient Education  2021 [place].   Minimally Invasive Cholecystectomy Minimally invasive cholecystectomy is surgery to remove the gallbladder. The gallbladder is a pear-shaped organ that lies beneath the liver  on the right side of the body. The gallbladder stores bile, which is a fluid that helps the body digest fats. Cholecystectomy is often done to treat inflammation of the gallbladder (cholecystitis). This condition is usually caused by a buildup of gallstones (cholelithiasis) in the gallbladder. Gallstones can block the flow of bile, which can result in inflammation and pain. In severe cases, emergency surgery may be required. This procedure is done though small incisions in the abdomen, instead of one large incision. It is also called laparoscopic surgery. A thin scope with a camera (laparoscope) is inserted through one incision. Then surgical instruments are inserted through the other incisions. In some cases, a minimally invasive surgery may need to be changed to a surgery that is done through a larger incision. This is called open surgery. Tell a health care provider about:  Any allergies you have.  All medicines you are taking, including vitamins, herbs, eye drops, creams, and over-the-counter medicines.  Any problems you or family members have had with anesthetic medicines.  Any blood disorders you have.  Any surgeries you have had.  Any medical conditions you have.  Whether you are pregnant or may be pregnant. What are the risks? Generally, this is a safe procedure. However,  problems may occur, including:  Infection.  Bleeding.  Allergic reactions to medicines.  Damage to nearby structures or organs.  A stone remaining in the common bile duct. The common bile duct carries bile from the gallbladder into the small intestine.  A bile leak from the cyst duct that is clipped when your gallbladder is removed. What happens before the procedure? Medicines Ask your health care provider about:  Changing or stopping your regular medicines. This is especially important if you are taking diabetes medicines or blood thinners.  Taking medicines such as aspirin and ibuprofen. These  medicines can thin your blood. Do not take these medicines unless your health care provider tells you to take them.  Taking over-the-counter medicines, vitamins, herbs, and supplements. General instructions  Let your health care provider know if you develop a cold or an infection before surgery.  Plan to have someone take you home from the hospital or clinic.  If you will be going home right after the procedure, plan to have someone with you for 24 hours.  Ask your health care provider: ? How your surgery site will be marked. ? What steps will be taken to help prevent infection. These may include:  Removing hair at the surgery site.  Washing skin with a germ-killing soap.  Taking antibiotic medicine. What happens during the procedure?  An IV will be inserted into one of your veins.  You will be given one or both of the following: ? A medicine to help you relax (sedative). ? A medicine to make you fall asleep (general anesthetic).  A breathing tube will be placed in your mouth.  Your surgeon will make several small incisions in your abdomen.  The laparoscope will be inserted through one of the small incisions. The camera on the laparoscope will send images to a monitor in the operating room. This lets your surgeon see inside your abdomen.  A gas will be pumped into your abdomen. This will expand your abdomen to give the surgeon more room to perform the surgery.  Other tools that are needed for the procedure will be inserted through the other incisions. The gallbladder will be removed through one of the incisions.  Your common bile duct may be examined. If stones are found in the common bile duct, they may be removed.  After your gallbladder has been removed, the incisions will be closed with stitches (sutures), staples, or skin glue.  Your incisions may be covered with a bandage (dressing). The procedure may vary among health care providers and hospitals.   What happens  after the procedure?  Your blood pressure, heart rate, breathing rate, and blood oxygen level will be monitored until you leave the hospital or clinic.  You will be given medicines as needed to control your pain.  If you were given a sedative during the procedure, it can affect you for several hours. Do not drive or operate machinery until your health care provider says that it is safe. Summary  Minimally invasive cholecystectomy, also called laparoscopic cholecystectomy, is surgery to remove the gallbladder using small incisions.  Tell your health care provider about all the medical conditions you have and all the medicines you are taking for those conditions.  Before the procedure, follow instructions about eating or drinking restrictions and changing or stopping medicines.  If you were given a sedative during the procedure, it can affect you for several hours. Do not drive or operate machinery until your health care provider says that it is  safe. This information is not intended to replace advice given to you by your health care provider. Make sure you discuss any questions you have with your health care provider. Document Revised: 03/01/2019 Document Reviewed: 03/01/2019 Elsevier Patient Education  [place].

## 2020-06-20 NOTE — Progress Notes (Signed)
[facility] History and Physical   Chief Complaint    Hospitalization Follow-up      Jeffrey Sherman is a 87 y.o. male.  HPI: Jeffrey Sherman is a 87 yo who was admitted to the hospital recently with colitis and diarrhea but started to have upper abdominal pain and some reflux symptoms and nausea. When I saw him in the hospital he was tolerating a diet but says that he has really had a poor appetite since being in the hospital.  He says that he has continued to have diarrhea and that [facility] has not been really giving him the imodium scheduled.   He had work up in the hospital with HIDA that demonstrated a non functioning gallbladder consistent with severe biliary dyskinesia. This was a little bit convoluted during the hospitalization due to his other acute issues so we decided to see how he felt over the next few days.   He also had workup that showed stones.   He is here today with his daughter in law. He had a hip surgery a few months back with a block and general anesthesia. He denies any new chest pain or shortness of breath. He does describe burning reflux symptoms but no chest pressure or heaviness on his chest.   Past Medical History:  Diagnosis Date  . Anxiety   . Cancer [facility])    prostate  . Chronic chest wall pain   . Essential hypertension, benign 06/20/2016  . GAD (generalized anxiety disorder)   . GERD (gastroesophageal reflux disease)   . H/O echocardiogram 2016   normal  . Heart murmur    "leaking heart valve"  . High cholesterol 06/20/2016  . HOH (hard of hearing)    left  . Hx of falling   . Hyperlipidemia   . Interstitial pulmonary disease ([place])   . MDD (major depressive disorder)   . Murmur, cardiac   . Poor historian   . PUD (peptic ulcer disease)   . RLS (restless legs syndrome)   . Shortness of breath   . Wrist fracture 12/16/2011    Past Surgical History:  Procedure Laterality Date  . COLONOSCOPY  2005   Rehman: Sigmoid colon  diverticulosis, moderate sized external hemorrhoids. A few focal areas of mucosal erythema felt to be nonspecific.  Marland Kitchen ESOPHAGOGASTRODUODENOSCOPY    . ESOPHAGOGASTRODUODENOSCOPY N/A 07/18/2016   Rehman: normal exam except duodenal scar. h.pylori serologies negative  . FLEXIBLE SIGMOIDOSCOPY N/A 06/11/2020   Procedure: FLEXIBLE SIGMOIDOSCOPY;  Surgeon: Eloise Harman, DO;  Location: [facility] ENDO SUITE;  Service: Endoscopy;  Laterality: N/A;  . HIP PINNING,CANNULATED Right 03/10/2020   Procedure: INTERNAL FIXATION RIGHT HIP;  Surgeon: Carole Civil, MD;  Location: [facility] ORS;  Service: Orthopedics;  Laterality: Right;  . ORIF WRIST FRACTURE  12/19/2011   Procedure: OPEN REDUCTION INTERNAL FIXATION (ORIF) WRIST FRACTURE;  Surgeon: Carole Civil, MD;  Location: [facility] ORS;  Service: Orthopedics;  Laterality: Right;  . prostate cancer     Diagnosed in the 90s  . TONSILLECTOMY      Family History  Problem Relation Age of Onset  . Cancer Other   . Heart attack Mother   . Cancer Brother   . Heart attack Brother   . Heart disease Sister        by pass    Social History   Tobacco Use  . Smoking status: Never Smoker  . Smokeless tobacco: Never Used  Vaping Use  . Vaping Use: Never  used  Substance Use Topics  . Alcohol use: No  . Drug use: No    Medications: I have reviewed the patient's current medications. Allergies as of 06/20/2020   No Known Allergies     Medication List    Notice   This visit is during an admission. Changes to the med list made in this visit will be reflected in the After Visit Summary of the admission.      ROS:  A comprehensive review of systems was negative except for: Gastrointestinal: positive for abdominal pain, nausea and reflux symptoms  Blood pressure (!) 157/76, pulse 70, temperature 97.9 F (36.6 C), temperature source Other (Comment), resp. rate 12, height 5\' 9"  (1.753 m), weight 197 lb (89.4 kg), SpO2 92 %. Physical Exam Vitals reviewed.   Constitutional:      Appearance: Normal appearance.  HENT:     Head: Normocephalic.     Nose: Nose normal.     Mouth/Throat:     Mouth: Mucous membranes are moist.  Eyes:     Extraocular Movements: Extraocular movements intact.  Cardiovascular:     Rate and Rhythm: Normal rate.  Pulmonary:     Effort: Pulmonary effort is normal.     Breath sounds: Normal breath sounds.  Abdominal:     General: There is distension.     Palpations: Abdomen is soft.     Tenderness: There is abdominal tenderness.  Musculoskeletal:        General: No swelling. Normal range of motion.  Neurological:     General: No focal deficit present.     Mental Status: He is alert and oriented to person, place, and time.  Psychiatric:        Mood and Affect: Mood normal.        Behavior: Behavior normal.        Thought Content: Thought content normal.        Judgment: Judgment normal.     Results: CLINICAL DATA:  Cholelithiasis.  Evaluate for biliary dyskinesia.  EXAM: NUCLEAR MEDICINE HEPATOBILIARY IMAGING WITH GALLBLADDER EF  TECHNIQUE: Sequential images of the abdomen were obtained out to 60 minutes following intravenous administration of radiopharmaceutical. After slow intravenous infusion of 1.7 micrograms Cholecystokinin, gallbladder ejection fraction was determined.  RADIOPHARMACEUTICALS:  5.1 mCi Tc-46m Choletec IV  COMPARISON:  Right upper quadrant abdominal ultrasound-06/09/2020; CT abdomen pelvis-06/09/2020  FINDINGS: There is homogeneous distribution of injected radiotracer throughout the hepatic parenchyma.  There is early opacification of the gallbladder, initially seen on the 15 minutes anterior projection planar image  There is early excretion of radiotracer with opacification of the proximal small bowel, initially seen on the 20 minutes anterior projection planar image.  Following the administration of CCK, there is little to no gallbladder  emptying.  Additionally, the patient reported mild abdominal pain during the CCK infusion.  IMPRESSION: 1. No scintigraphic evidence of acute cholecystitis. 2. Findings compatible with severe biliary dyskinesia with little to no gallbladder emptying and elicitation of abdominal pain with the CCK administration.   Electronically Signed   By: Sandi Mariscal M.D.   On: 06/12/2020 11:52  CLINICAL DATA:  Right upper quadrant pain  EXAM: ABDOMEN ULTRASOUND COMPLETE  COMPARISON:  Ultrasound 03/12/2020, CT 06/09/2020  FINDINGS: Gallbladder: Gallbladder appears distended. Moderate sludge within the gallbladder lumen. Echodense foci in the sludge may reflect small stones. Slight increased wall thickness of 3.6 mm. Small amount of pericholecystic fluid. Negative sonographic Murphy. Gallbladder polyp up to 6 mm.  Common bile duct: Diameter:  6 mm  Liver: Within normal limits for echogenicity. No focal hepatic abnormality. Hepatic cyst on CT not well seen on sonography. Portal vein is patent on color Doppler imaging with normal direction of blood flow towards the liver.  IVC: No abnormality visualized.  Pancreas: Visualized portion unremarkable.  Spleen: Size and appearance within normal limits.  Right Kidney: Length: 11 cm. Borderline to mild renal cortical thickening. No hydronephrosis. Multiple cysts in the right kidney, the largest is seen in the lower pole and measures 5.3 cm.  Left Kidney: Length: 12 cm. Echogenicity normal. No hydronephrosis. Dominant cyst in the upper pole measuring 6.6 cm.  Abdominal aorta: No aneurysm visualized.  Other findings: Incidental note made of bilateral pleural effusions.  IMPRESSION: 1. Moderate gallbladder sludge with probable punctate stones. Slight increased wall thickness with small amount of fluid adjacent to the gallbladder but negative sonographic Murphy. Findings are suggestive of but not definitive for  cholecystitis; consider correlation with nuclear medicine hepatobiliary imaging. 6 mm gallbladder polyp. 2. Simple appearing renal cysts. 3. Incidental note made of pleural effusions.  Assessment & Plan:  Jeffrey Sherman is a 87 y.o. male with HIDA that demonstrated biliary dyskinesia and [place] with stones. He says he is still having symptoms with pain and nausea. He has diarrhea too.   PLAN: I counseled the patient about the indication, risks and benefits of laparoscopic cholecystectomy.  He understands there is a very small chance for bleeding, infection, injury to normal structures (including common bile duct), conversion to open surgery, persistent symptoms, evolution of postcholecystectomy diarrhea, need for secondary interventions, anesthesia reaction, cardiopulmonary issues and other risks not specifically detailed here. I described the expected recovery, the plan for follow-up and the restrictions during the recovery phase.  All questions were answered.  He understands that his diarrhea may worsen with gallbladder removal.    All questions were answered to the satisfaction of the patient and family.   Jeffrey Sherman 06/20/2020, 1:25 PM

## 2020-06-20 NOTE — H&P (Signed)
[facility] History and Physical   Chief Complaint    Hospitalization Follow-up      Jeffrey Sherman is a 87 y.o. male.  HPI: Jeffrey. Sherman is a 87 yo who was admitted to the hospital recently with colitis and diarrhea but started to have upper abdominal pain and some reflux symptoms and nausea. When I saw him in the hospital he was tolerating a diet but says that he has really had a poor appetite since being in the hospital.  He says that he has continued to have diarrhea and that [facility] has not been really giving him the imodium scheduled.   He had work up in the hospital with HIDA that demonstrated a non functioning gallbladder consistent with severe biliary dyskinesia. This was a little bit convoluted during the hospitalization due to his other acute issues so we decided to see how he felt over the next few days.   He also had workup that showed stones.   He is here today with his daughter in law. He had a hip surgery a few months back with a block and general anesthesia. He denies any new chest pain or shortness of breath. He does describe burning reflux symptoms but no chest pressure or heaviness on his chest.   Past Medical History:  Diagnosis Date  . Anxiety   . Cancer [facility])    prostate  . Chronic chest wall pain   . Essential hypertension, benign 06/20/2016  . GAD (generalized anxiety disorder)   . GERD (gastroesophageal reflux disease)   . H/O echocardiogram 2016   normal  . Heart murmur    "leaking heart valve"  . High cholesterol 06/20/2016  . HOH (hard of hearing)    left  . Hx of falling   . Hyperlipidemia   . Interstitial pulmonary disease ([place])   . MDD (major depressive disorder)   . Murmur, cardiac   . Poor historian   . PUD (peptic ulcer disease)   . RLS (restless legs syndrome)   . Shortness of breath   . Wrist fracture 12/16/2011    Past Surgical History:  Procedure Laterality Date  . COLONOSCOPY  2005   Rehman: Sigmoid colon  diverticulosis, moderate sized external hemorrhoids. A few focal areas of mucosal erythema felt to be nonspecific.  Marland Kitchen ESOPHAGOGASTRODUODENOSCOPY    . ESOPHAGOGASTRODUODENOSCOPY N/A 07/18/2016   Rehman: normal exam except duodenal scar. h.pylori serologies negative  . FLEXIBLE SIGMOIDOSCOPY N/A 06/11/2020   Procedure: FLEXIBLE SIGMOIDOSCOPY;  Surgeon: Eloise Harman, DO;  Location: [facility];  Service: Endoscopy;  Laterality: N/A;  . HIP PINNING,CANNULATED Right 03/10/2020   Procedure: INTERNAL FIXATION RIGHT HIP;  Surgeon: Carole Civil, MD;  Location: [facility] ORS;  Service: Orthopedics;  Laterality: Right;  . ORIF WRIST FRACTURE  12/19/2011   Procedure: OPEN REDUCTION INTERNAL FIXATION (ORIF) WRIST FRACTURE;  Surgeon: Carole Civil, MD;  Location: [facility] ORS;  Service: Orthopedics;  Laterality: Right;  . prostate cancer     Diagnosed in the 90s  . TONSILLECTOMY      Family History  Problem Relation Age of Onset  . Cancer Other   . Heart attack Mother   . Cancer Brother   . Heart attack Brother   . Heart disease Sister        by pass    Social History   Tobacco Use  . Smoking status: Never Smoker  . Smokeless tobacco: Never Used  Vaping Use  . Vaping Use: Never  used  Substance Use Topics  . Alcohol use: No  . Drug use: No    Medications: I have reviewed the patient's current medications. Allergies as of 06/20/2020   No Known Allergies     Medication List    Notice   This visit is during an admission. Changes to the med list made in this visit will be reflected in the After Visit Summary of the admission.      ROS:  A comprehensive review of systems was negative except for: Gastrointestinal: positive for abdominal pain, nausea and reflux symptoms  Blood pressure (!) 157/76, pulse 70, temperature 97.9 F (36.6 C), temperature source Other (Comment), resp. rate 12, height 5\' 9"  (1.753 m), weight 197 lb (89.4 kg), SpO2 92 %. Physical Exam Vitals reviewed.   Constitutional:      Appearance: Normal appearance.  HENT:     Head: Normocephalic.     Nose: Nose normal.     Mouth/Throat:     Mouth: Mucous membranes are moist.  Eyes:     Extraocular Movements: Extraocular movements intact.  Cardiovascular:     Rate and Rhythm: Normal rate.  Pulmonary:     Effort: Pulmonary effort is normal.     Breath sounds: Normal breath sounds.  Abdominal:     General: There is distension.     Palpations: Abdomen is soft.     Tenderness: There is abdominal tenderness.  Musculoskeletal:        General: No swelling. Normal range of motion.  Neurological:     General: No focal deficit present.     Mental Status: He is alert and oriented to person, place, and time.  Psychiatric:        Mood and Affect: Mood normal.        Behavior: Behavior normal.        Thought Content: Thought content normal.        Judgment: Judgment normal.     Results: CLINICAL DATA:  Cholelithiasis.  Evaluate for biliary dyskinesia.  EXAM: NUCLEAR MEDICINE HEPATOBILIARY IMAGING WITH GALLBLADDER EF  TECHNIQUE: Sequential images of the abdomen were obtained out to 60 minutes following intravenous administration of radiopharmaceutical. After slow intravenous infusion of 1.7 micrograms Cholecystokinin, gallbladder ejection fraction was determined.  RADIOPHARMACEUTICALS:  5.1 mCi Tc-46m Choletec IV  COMPARISON:  Right upper quadrant abdominal ultrasound-06/09/2020; CT abdomen pelvis-06/09/2020  FINDINGS: There is homogeneous distribution of injected radiotracer throughout the hepatic parenchyma.  There is early opacification of the gallbladder, initially seen on the 15 minutes anterior projection planar image  There is early excretion of radiotracer with opacification of the proximal small bowel, initially seen on the 20 minutes anterior projection planar image.  Following the administration of CCK, there is little to no gallbladder  emptying.  Additionally, the patient reported mild abdominal pain during the CCK infusion.  IMPRESSION: 1. No scintigraphic evidence of acute cholecystitis. 2. Findings compatible with severe biliary dyskinesia with little to no gallbladder emptying and elicitation of abdominal pain with the CCK administration.   Electronically Signed   By: Sandi Mariscal M.D.   On: 06/12/2020 11:52  CLINICAL DATA:  Right upper quadrant pain  EXAM: ABDOMEN ULTRASOUND COMPLETE  COMPARISON:  Ultrasound 03/12/2020, CT 06/09/2020  FINDINGS: Gallbladder: Gallbladder appears distended. Moderate sludge within the gallbladder lumen. Echodense foci in the sludge may reflect small stones. Slight increased wall thickness of 3.6 mm. Small amount of pericholecystic fluid. Negative sonographic Murphy. Gallbladder polyp up to 6 mm.  Common bile duct: Diameter:  6 mm  Liver: Within normal limits for echogenicity. No focal hepatic abnormality. Hepatic cyst on CT not well seen on sonography. Portal vein is patent on color Doppler imaging with normal direction of blood flow towards the liver.  IVC: No abnormality visualized.  Pancreas: Visualized portion unremarkable.  Spleen: Size and appearance within normal limits.  Right Kidney: Length: 11 cm. Borderline to mild renal cortical thickening. No hydronephrosis. Multiple cysts in the right kidney, the largest is seen in the lower pole and measures 5.3 cm.  Left Kidney: Length: 12 cm. Echogenicity normal. No hydronephrosis. Dominant cyst in the upper pole measuring 6.6 cm.  Abdominal aorta: No aneurysm visualized.  Other findings: Incidental note made of bilateral pleural effusions.  IMPRESSION: 1. Moderate gallbladder sludge with probable punctate stones. Slight increased wall thickness with small amount of fluid adjacent to the gallbladder but negative sonographic Murphy. Findings are suggestive of but not definitive for  cholecystitis; consider correlation with nuclear medicine hepatobiliary imaging. 6 mm gallbladder polyp. 2. Simple appearing renal cysts. 3. Incidental note made of pleural effusions.  Assessment & Plan:  AKEIM REFUERZO is a 87 y.o. male with HIDA that demonstrated biliary dyskinesia and [place] with stones. He says he is still having symptoms with pain and nausea. He has diarrhea too.   PLAN: I counseled the patient about the indication, risks and benefits of laparoscopic cholecystectomy.  He understands there is a very small chance for bleeding, infection, injury to normal structures (including common bile duct), conversion to open surgery, persistent symptoms, evolution of postcholecystectomy diarrhea, need for secondary interventions, anesthesia reaction, cardiopulmonary issues and other risks not specifically detailed here. I described the expected recovery, the plan for follow-up and the restrictions during the recovery phase.  All questions were answered.  He understands that his diarrhea may worsen with gallbladder removal.    All questions were answered to the satisfaction of the patient and family.   [name] 06/20/2020, 1:25 PM

## 2020-06-21 ENCOUNTER — Encounter: Payer: Self-pay | Admitting: Adult Health

## 2020-06-21 ENCOUNTER — Non-Acute Institutional Stay (SKILLED_NURSING_FACILITY): Payer: Medicare Other | Admitting: Adult Health

## 2020-06-21 DIAGNOSIS — S72001A Fracture of unspecified part of neck of right femur, initial encounter for closed fracture: Secondary | ICD-10-CM

## 2020-06-21 DIAGNOSIS — K559 Vascular disorder of intestine, unspecified: Secondary | ICD-10-CM | POA: Diagnosis not present

## 2020-06-21 DIAGNOSIS — K8020 Calculus of gallbladder without cholecystitis without obstruction: Secondary | ICD-10-CM | POA: Diagnosis not present

## 2020-06-21 DIAGNOSIS — I10 Essential (primary) hypertension: Secondary | ICD-10-CM

## 2020-06-21 DIAGNOSIS — K828 Other specified diseases of gallbladder: Secondary | ICD-10-CM

## 2020-06-21 NOTE — Progress Notes (Signed)
Location:  [facility] Room Number: 156/D Place of Service:  SNF (31)   CODE STATUS: DNR  No Known Allergies  Chief Complaint  Patient presents with  . Short Term Rehab (STR)           Ischemic colitis/biliary dyskinesia/calculus of gall bladder without cholecystitis without obstruction:     Closed displaced fracture of right femoral neck: Essential hypertension:  Weekly follow up for the first 30 days post hospitalization.     HPI:  He is a 87 year old long term resident of this faiclity being seen for the management of his chronic illnesses:  Ischemic colitis/biliary dyskinesia/calculus of gall bladder without cholecystitis without obstruction:     Closed displaced fracture of right femoral neck: Essential hypertension. He continues on a bland diet. He does complain of gas build up.   He denies any uncontrolled pain. Does have some nausea without vomiting at this time      Past Medical History:  Diagnosis Date  . Anxiety   . Cancer [facility])    prostate  . Chronic chest wall pain   . Essential hypertension, benign 06/20/2016  . GAD (generalized anxiety disorder)   . GERD (gastroesophageal reflux disease)   . H/O echocardiogram 2016   normal  . Heart murmur    "leaking heart valve"  . High cholesterol 06/20/2016  . HOH (hard of hearing)    left  . Hx of falling   . Hyperlipidemia   . Interstitial pulmonary disease ([place])   . MDD (major depressive disorder)   . Murmur, cardiac   . Poor historian   . PUD (peptic ulcer disease)   . RLS (restless legs syndrome)   . Shortness of breath   . Wrist fracture 12/16/2011    Past Surgical History:  Procedure Laterality Date  . COLONOSCOPY  2005   Rehman: Sigmoid colon diverticulosis, moderate sized external hemorrhoids. A few focal areas of mucosal erythema felt to be nonspecific.  Marland Kitchen ESOPHAGOGASTRODUODENOSCOPY    . ESOPHAGOGASTRODUODENOSCOPY N/A 07/18/2016   Rehman: normal exam except duodenal scar. h.pylori  serologies negative  . FLEXIBLE SIGMOIDOSCOPY N/A 06/11/2020   Procedure: FLEXIBLE SIGMOIDOSCOPY;  Surgeon: Eloise Harman, DO;  Location: [facility] ENDO SUITE;  Service: Endoscopy;  Laterality: N/A;  . HIP PINNING,CANNULATED Right 03/10/2020   Procedure: INTERNAL FIXATION RIGHT HIP;  Surgeon: Carole Civil, MD;  Location: [facility] ORS;  Service: Orthopedics;  Laterality: Right;  . ORIF WRIST FRACTURE  12/19/2011   Procedure: OPEN REDUCTION INTERNAL FIXATION (ORIF) WRIST FRACTURE;  Surgeon: Carole Civil, MD;  Location: [facility] ORS;  Service: Orthopedics;  Laterality: Right;  . prostate cancer     Diagnosed in the 90s  . TONSILLECTOMY      Social History   Socioeconomic History  . Marital status: Widowed    Spouse name: Not on file  . Number of children: Not on file  . Years of education: Not on file  . Highest education level: Not on file  Occupational History  . Occupation: retired    Fish farm manager: RETIRED  Tobacco Use  . Smoking status: Never Smoker  . Smokeless tobacco: Never Used  Vaping Use  . Vaping Use: Never used  Substance and Sexual Activity  . Alcohol use: No  . Drug use: No  . Sexual activity: Not on file  Other Topics Concern  . Not on file  Social History Narrative  . Not on file   Social Determinants of Health   Financial  Resource Strain: Not on file  Food Insecurity: Not on file  Transportation Needs: Not on file  Physical Activity: Not on file  Stress: Not on file  Social Connections: Not on file  Intimate Partner Violence: Not on file   Family History  Problem Relation Age of Onset  . Cancer Other   . Heart attack Mother   . Cancer Brother   . Heart attack Brother   . Heart disease Sister        by pass      VITAL SIGNS BP 126/70   Pulse 73   Temp 98.1 F (36.7 C)   Resp 18   Ht 5\' 9"  (1.753 m)   Wt 202 lb 3.2 oz (91.7 kg)   SpO2 95%   BMI 29.86 kg/m   Outpatient Encounter Medications as of 06/21/2020  Medication Sig  . acetaminophen  (TYLENOL) 500 MG tablet Take 1,000 mg by mouth in the morning, at noon, and at bedtime.  Marland Kitchen alfuzosin (UROXATRAL) 10 MG 24 hr tablet Take 10 mg by mouth daily with breakfast.  . Amino Acids-Protein Hydrolys (PRO-STAT PO) liquid; 15-100 gram-kcal/30 mL; amt: 30 ml; oral Special Instructions: for albumin 2.5 With Meals  . Ascorbic Acid (VITAMIN C) 1000 MG tablet Take 1,000 mg by mouth daily.  Marland Kitchen aspirin 81 MG chewable tablet Chew 81 mg by mouth daily.  Roseanne Kaufman [place]-Castor Oil [facility]) OINT Special Instructions: Apply to bilateral buttocks & sacral area qshift for blanchable erythema.  . calcium carbonate (TUMS - DOSED IN MG ELEMENTAL CALCIUM) 500 MG chewable tablet Chew 1 tablet by mouth 2 (two) times daily as needed for indigestion or heartburn.   . diltiazem (CARDIZEM CD) 360 MG 24 hr capsule Take 360 mg by mouth daily. Start 03/23/2020  . ferrous sulfate 325 (65 FE) MG tablet Take 325 mg by mouth daily with breakfast. For Anemia and  Iron Deficiency  . Flax Oil-Fish Oil-Borage Oil (FISH OIL-FLAX OIL-BORAGE OIL) CAPS Take 1 capsule by mouth daily.  Marland Kitchen ipratropium-albuterol (DUONEB) 0.5-2.5 (3) MG/3ML SOLN Take 3 mLs by nebulization every 6 (six) hours as needed.  Marland Kitchen lisinopril (ZESTRIL) 40 MG tablet Take 1 tablet (40 mg total) by mouth daily.  Marland Kitchen loperamide (IMODIUM A-D) 2 MG tablet amt: 2mg ; oral Special Instructions: after each stool do not exceed more than 4tabs in 24 hours, As Needed  . Melatonin 10 MG TABS Take 10 mg by mouth at bedtime.  . memantine (NAMENDA) 10 MG tablet Take 10 mg by mouth 2 (two) times daily.  . metoprolol tartrate (LOPRESSOR) 25 MG tablet Take 1 tablet (25 mg total) by mouth 2 (two) times daily.  . NON FORMULARY Diet - Mechanical Soft _ Bland diet only. (EX: Bread, rice, toast, crackers, applesauce, mashed potatoes, BRAT diet, avoid fatty foods)  . omeprazole (PRILOSEC) 20 MG capsule Take 1 capsule (20 mg total) by mouth daily before breakfast.  . ondansetron (ZOFRAN-ODT)  8 MG disintegrating tablet Take 8 mg by mouth 3 (three) times daily before meals. For vomiting and nausea  . polyethylene glycol (MIRALAX / GLYCOLAX) 17 g packet Take 17 g by mouth daily as needed.  . potassium chloride SA (KLOR-CON) 20 MEQ tablet Take 20 mEq by mouth 2 (two) times daily.   Marland Kitchen rOPINIRole (REQUIP) 0.25 MG tablet Take 0.25 mg by mouth at bedtime.  . salicylic acid 17 % gel Apply 1 application topically in the morning and at bedtime. Apply to warts to left hand  . senna-docusate (SENOKOT-S)  8.6-50 MG tablet Take 1 tablet by mouth at bedtime as needed for mild constipation.  Marland Kitchen terazosin (HYTRIN) 5 MG capsule Take 5 mg by mouth at bedtime.   Marland Kitchen venlafaxine XR (EFFEXOR-XR) 150 MG 24 hr capsule Take 150 mg by mouth daily with breakfast. Along with 75 mg to = 225 mg  . venlafaxine XR (EFFEXOR-XR) 75 MG 24 hr capsule Take 75 mg by mouth daily with breakfast. Take along with 150 mg to = 225 mg   No facility-administered encounter medications on file as of 06/21/2020.     SIGNIFICANT DIAGNOSTIC EXAMS  PREVIOUS   03-09-20: right hip x-ray: Minimally displaced right femoral neck fracture.  03-09-20: chest x-ray: Peribronchial thickening with streaky perihilar infiltrates suggesting bronchitis or multifocal pneumonia.  03-12-20: renal ultrasound: No acute abnormality. Bilateral renal cysts  03-12-20: chest x-ray: Lower lung volumes. No acute cardiopulmonary abnormality.   05-22-20: chest x-ray:  1. Mild right basilar atelectasis. 2. Improved cardiomegaly from prior. Stable aortic atherosclerosis and tortuosity  05-22-20: ct of abdomen and pelvis:  Findings consistent with colitis involving the distal transverse colon, splenic flexure, and descending colon. This is likely ischemic or infectious in etiology. Diverticulosis is present but mostly distal to the area of thickening and diverticulitis is therefore less likely. 3 mm nonobstructing left renal calculus. Small bladder  diverticula.  05-26-20: ct angio of abdomen and pelvis:  1. Persistent colitis involving distal transverse colon, splenic flexure, and descending colon. 2. Mild lower abdominal ascites, slightly increased on a. 3. 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 The celiac axis and superior mesenteric artery remain widely patent however, which typically allows adequate collateral perfusion. 4. Increase in small bilateral pleural effusions and dependent atelectasis/consolidation in both lung bases since previous study. 5. Sigmoid diverticulosis. 6. 1 mm mid left ureteral calculus with stable left renal pelvicaliectasis. 7. Right nephrolithiasis. 8. Bilateral renal artery ostial stenosis of possible hemodynamic significance.  9. Aortic Atherosclerosis  06-05-20: ct of abdomen and pelvis:  Mild colonic distension with air-fluid levels and segmental wall thickening likely reflects colitis. Additional stable findings detailed above.  06-09-20: acute abdomen:  Dilated and air-filled colon is noted suggesting possible ileus. No acute cardiopulmonary disease  06-09-20: ct of abdomen and pelvis:  Cholelithiasis. Haziness noted around the gallbladder. Cannot exclude acute cholecystitis. Continued wall thickening in the left colon from the distal transverse colon through the distal descending colon concerning for colitis. Left colonic diverticulosis. Small bilateral pleural effusions, increasing since prior study. Bibasilar atelectasis. Coronary artery disease, aortic atherosclerosis.  06-09-20: abdominal ultrasound:  1. Moderate gallbladder sludge with probable punctate stones. Slight increased wall thickness with small amount of fluid adjacent to the gallbladder but negative sonographic Murphy. Findings are suggestive of but not definitive for cholecystitis; consider correlation with nuclear medicine hepatobiliary imaging. 6 mm gallbladder polyp. 2. Simple appearing renal cysts. 3.  Incidental note made of pleural effusions.  06-12-20: HIDA:  1. No scintigraphic evidence of acute cholecystitis. 2. Findings compatible with severe biliary dyskinesia with little to no gallbladder emptying and elicitation of abdominal pain with the CCK administration.  06-13-20: chest x-ray:  Diffuse interstitial opacity with Awanda Mink lines with possible trace pleural fluid. Stable heart size and aortic contours. Negative for air leak  NO NEW EXAMS.      LABS REVIEWED PREVIOUS    03-09-20: wbc 13.7; hgb 12.1; hct 37.7; mcv 80.4 plt 205; glucose 208; bun 21; creat 1.50; k+ 3.2; na++ 141; ca 9.2; hgb a1c 5.9; HIV: nr 03-10-20:  wbc 11.2; hgb 11.3; hct 35.9; mcv 82.7 plt 188; glucose 160; bun 18; creat 1.21; k+ 3.3; na++ 139; ca 8.8 liver normal 3.0; mag 2.3 03-12-20: wbc 10.4; hgb 9.3; hct 29.6; mcv 84.3 plt 178; glucose 138; bun 34; creat 1.77; k+ 3.8; na++ 138; ca 8.3 03-14-20: glucose 134; bun 36; creat 1.23; k+ 3.2; na++ 139; ca 8.8 mag 2.1   03-16-20: hgb 10.7; hct 33.4 glucose 127; bun 18; creat 0.86 k+ 3.3; na++ 139; ca 9.2 ;liver normal albumin 2.5 iron 23; tibc 221; ferritin 137 03-23-20: k+ 3.0  03-27-20: glucose 133; bun 34; creat 0.85; k+ 3.6; na++ 142; ca 9.0; AM cortisol: 10.6  05-22-20: wbc 19.4; hgb 14.6; hct 43.8; mcv 81.6 plt 254; glucose 211; bun 35; creat 1.20; k+ 3.0; na++ 138; ca 9.8; liver normal albumin 3.5 mag 1.9; urine culture: <10,000; blood culture: no growth 05-25-20: wbc 14.2; hgb 20.8; hct 34.8; mcv 84.7 plt 212; glucose 109; bun 24; creat 0.86; k+ 3.4; na++ 137; ca 8.9; mag 1.9 05-28-20: wbc 10.9; hgb 11.8; hct 36.8; mcv 81.8 plt 227; glucose 132; bun 10; creat 0.82; k+ 3.0; na++ 137; ca 8.7 06-01-20: wbc 12.2; hgb 12.5; hct 40.3; mcv 81.9 plt 136; glucose 136; bun 12; creat 0.81; k+ 3.0; na++ 140; ca 8.9 mag 1.9 06-05-20: wbc 13.6; hgb 10.2; hct 32.9; mcv 93.5 plt 266 glucose 125; bun 35; creat 1.26; k+ 3.5; na++ 137; ca 8.9  06-07-20: wbc 11.9; hgb 9.6; hct 29.8; mcv  82.8 plt 318; glucose 114; bun 42; creat 1.47; k+ 3.9; na++ 138; ca 8.8 GFR 46; liver normal albumin 2.1  06-08-20: glucose 107; bun 57; creat 1.81; k+ 4.3; na++ 135; ca 8.9 GFR 36 06-09-20: glucose 107; bun 52; creat 1.64; k+ 5.3; na++ 136; ca 9.3;  GFR 40; liver normal albumin 2.3 06-10-20: wbc 12.6; hgb 10.5; hct 34.2; mcv 83.4 plt 417; glucose 126; bun 39; creat 4.4; na++ 137; ca 9.5 GFR 58; liver normal albumin 2.2 06-12-20: wbc 8.2; hgb 10.4; hct 33.6; mcv 81.2 plt 364; glucose 123; bun 24; creat 0.87; k+ 3.5; na++ 138; GFR >60; liver normal albumin 1.9 06-13-20: urine culture: <10,000 colonies 06-14-20: wbc 9.0; hgb 10.5; hct 33.8; mcv 80.5 plt 267; glucose 122; bun 20; creat 0.97; k+3.3; na++ 140; ca 9.0 GFR >60  NO NEW LABS.   Review of Systems  Constitutional: Negative for malaise/fatigue.  Respiratory: Negative for cough and shortness of breath.   Cardiovascular: Negative for chest pain, palpitations and leg swelling.  Gastrointestinal: Negative for abdominal pain, constipation and heartburn.       Has gas   Musculoskeletal: Negative for back pain, joint pain and myalgias.  Skin: Negative.   Neurological: Negative for dizziness.  Psychiatric/Behavioral: The patient is not nervous/anxious.     Physical Exam Constitutional:      General: He is not in acute distress.    Appearance: He is well-developed and well-nourished. He is not diaphoretic.  Neck:     Thyroid: No thyromegaly.  Cardiovascular:     Rate and Rhythm: Normal rate and regular rhythm.     Pulses: Intact distal pulses.     Heart sounds: Normal heart sounds.  Pulmonary:     Effort: Pulmonary effort is normal. No respiratory distress.     Breath sounds: Normal breath sounds.  Abdominal:     General: Bowel sounds are normal. There is no distension.     Palpations: Abdomen is soft.     Tenderness: There is  abdominal tenderness.  Musculoskeletal:        General: No edema.     Right lower leg: No edema.     Left lower  leg: No edema.     Comments: Is able to move all extremities  Status post right hip internal fixation 03-10-20     Lymphadenopathy:     Cervical: No cervical adenopathy.  Skin:    General: Skin is warm and dry.  Neurological:     Mental Status: He is alert. Mental status is at baseline.  Psychiatric:        Mood and Affect: Mood and affect and mood normal.     TODAY  1. Ischemic colitis/biliary dyskinesia/calculus of gall bladder without cholecystitis without obstruction: is presently stable will continue bland diet will continue zofran 8 mg prior to meals for nausea;  Will begin gas x 125 mg four times daily for gas build up will monitor  2. Closed displaced fracture of right femoral neck: is status post right internal fixation 03-10-20 will continue tylenol 1 gm three times daily   3. Essential hypertension: is stable b/p 126/70 will conitne lopressor 25 mg twice daily cardizem cd 360 mg daily lisinopril 40 mg daily    PREVIOUS   4. Gastroesophageal reflux disease without esophagitis: is stable will continue prilosec 20 mg daily  5. Prostate cancer/overactive bladder: is stable will continue hytrin 5 mg daily; urosatral 10 mg daily is not on oxybrutin due to history fracture   6. Acute renal failure is stable bun 20 creat 0.97  7. Hypokalemia: is worse k+ 3.3 will continue k+ 20 meq twice daily and will repeat labs.   8. Dyslipidemia: is stable will continue fish oil daily is off zocor   9. Chronic constipation: is stable will continue miralax and senna s as needed   10. Major depression recurrent chronic: is stable will continue effexor xr 150 mg daily and melatonin 10 mg nightly for sleep.   11. RLS: is stable will continue requip 0.25 mg nightly   12. Iron deficiency due to dietary intake: is stable hgb 10.5 will continue iron daily   13.  Interstitial lung disease: is stable uses 02 prn; had duoneb every 6 hours as needed  14. Dementia without behavioral disturbance  unspecified dementia type: is stable will continue namenda 10 mg twice daily   15. Aortic atherosclerosis (CT 06-09-20) will monitor is on asa 81 mg daily   16. Protein calorie malnutrition, severe: is without change albumin 1.9 will continue prostat tid and supplements as directed will monitor      MD is aware of resident's narcotic use and is in agreement with current plan of care. We will attempt to wean resident as appropriate.  Ok Edwards NP [facility]  Contact [phone] Monday through Friday 8am- 5pm  After hours call [phone]

## 2020-06-22 ENCOUNTER — Encounter (HOSPITAL_COMMUNITY)
Admit: 2020-06-22 | Discharge: 2020-06-22 | Disposition: A | Discharge status: Home / Self Care | Payer: Medicare Other | Attending: General Surgery | Admitting: General Surgery

## 2020-06-22 ENCOUNTER — Encounter (HOSPITAL_COMMUNITY): Payer: Self-pay

## 2020-06-22 ENCOUNTER — Other Ambulatory Visit: Payer: Self-pay

## 2020-06-26 ENCOUNTER — Other Ambulatory Visit (HOSPITAL_COMMUNITY): Payer: Medicare Other

## 2020-06-26 DIAGNOSIS — K828 Other specified diseases of gallbladder: Secondary | ICD-10-CM | POA: Diagnosis present

## 2020-06-27 DIAGNOSIS — I739 Peripheral vascular disease, unspecified: Secondary | ICD-10-CM | POA: Diagnosis not present

## 2020-06-27 DIAGNOSIS — L602 Onychogryphosis: Secondary | ICD-10-CM | POA: Diagnosis not present

## 2020-06-27 DIAGNOSIS — M24571 Contracture, right ankle: Secondary | ICD-10-CM | POA: Diagnosis not present

## 2020-06-27 DIAGNOSIS — M24572 Contracture, left ankle: Secondary | ICD-10-CM | POA: Diagnosis not present

## 2020-06-28 ENCOUNTER — Ambulatory Visit (HOSPITAL_COMMUNITY): Payer: Medicare Other | Admitting: Anesthesiology

## 2020-06-28 ENCOUNTER — Encounter (HOSPITAL_COMMUNITY)
Admission: RE | Disposition: A | Discharge status: Home / Self Care | Payer: Self-pay | Source: Home / Self Care | Attending: General Surgery

## 2020-06-28 ENCOUNTER — Other Ambulatory Visit: Payer: Self-pay

## 2020-06-28 ENCOUNTER — Observation Stay (HOSPITAL_COMMUNITY)
Admission: RE | Admit: 2020-06-28 | Discharge: 2020-06-29 | Disposition: A | Discharge status: Home / Self Care | Payer: Medicare Other | Attending: General Surgery | Admitting: General Surgery

## 2020-06-28 ENCOUNTER — Encounter (HOSPITAL_COMMUNITY): Payer: Self-pay | Admitting: General Surgery

## 2020-06-28 ENCOUNTER — Encounter: Payer: Self-pay | Admitting: Adult Health

## 2020-06-28 ENCOUNTER — Other Ambulatory Visit (HOSPITAL_COMMUNITY)
Admit: 2020-06-28 | Discharge: 2020-06-28 | Disposition: A | Discharge status: Home / Self Care | Payer: Medicare Other | Attending: General Surgery | Admitting: General Surgery

## 2020-06-28 DIAGNOSIS — Z79899 Other long term (current) drug therapy: Secondary | ICD-10-CM | POA: Diagnosis not present

## 2020-06-28 DIAGNOSIS — K5909 Other constipation: Secondary | ICD-10-CM | POA: Diagnosis not present

## 2020-06-28 DIAGNOSIS — K219 Gastro-esophageal reflux disease without esophagitis: Secondary | ICD-10-CM | POA: Diagnosis present

## 2020-06-28 DIAGNOSIS — R0600 Dyspnea, unspecified: Secondary | ICD-10-CM | POA: Diagnosis not present

## 2020-06-28 DIAGNOSIS — N3281 Overactive bladder: Secondary | ICD-10-CM | POA: Diagnosis present

## 2020-06-28 DIAGNOSIS — D508 Other iron deficiency anemias: Secondary | ICD-10-CM | POA: Diagnosis not present

## 2020-06-28 DIAGNOSIS — F0390 Unspecified dementia without behavioral disturbance: Secondary | ICD-10-CM | POA: Diagnosis not present

## 2020-06-28 DIAGNOSIS — I1A0 Resistant hypertension: Secondary | ICD-10-CM | POA: Diagnosis present

## 2020-06-28 DIAGNOSIS — R0689 Other abnormalities of breathing: Secondary | ICD-10-CM

## 2020-06-28 DIAGNOSIS — J849 Interstitial pulmonary disease, unspecified: Secondary | ICD-10-CM | POA: Diagnosis present

## 2020-06-28 DIAGNOSIS — I10 Essential (primary) hypertension: Secondary | ICD-10-CM | POA: Diagnosis not present

## 2020-06-28 DIAGNOSIS — Z20822 Contact with and (suspected) exposure to covid-19: Secondary | ICD-10-CM | POA: Diagnosis not present

## 2020-06-28 DIAGNOSIS — E441 Mild protein-calorie malnutrition: Secondary | ICD-10-CM | POA: Diagnosis present

## 2020-06-28 DIAGNOSIS — K8010 Calculus of gallbladder with chronic cholecystitis without obstruction: Secondary | ICD-10-CM | POA: Diagnosis not present

## 2020-06-28 DIAGNOSIS — E43 Unspecified severe protein-calorie malnutrition: Secondary | ICD-10-CM | POA: Diagnosis present

## 2020-06-28 DIAGNOSIS — E785 Hyperlipidemia, unspecified: Secondary | ICD-10-CM | POA: Diagnosis present

## 2020-06-28 DIAGNOSIS — K828 Other specified diseases of gallbladder: Secondary | ICD-10-CM | POA: Diagnosis present

## 2020-06-28 DIAGNOSIS — K559 Vascular disorder of intestine, unspecified: Secondary | ICD-10-CM | POA: Diagnosis present

## 2020-06-28 DIAGNOSIS — R1010 Upper abdominal pain, unspecified: Secondary | ICD-10-CM | POA: Diagnosis present

## 2020-06-28 DIAGNOSIS — C61 Malignant neoplasm of prostate: Secondary | ICD-10-CM | POA: Diagnosis present

## 2020-06-28 DIAGNOSIS — I700 Atherosclerosis of aorta: Secondary | ICD-10-CM | POA: Diagnosis present

## 2020-06-28 HISTORY — PX: CHOLECYSTECTOMY: SHX55

## 2020-06-28 LAB — BASIC METABOLIC PANEL
Anion gap: 9 (ref 5–15)
BUN: 27 mg/dL — ABNORMAL HIGH (ref 8–23)
CO2: 30 mmol/L (ref 22–32)
Calcium: 8.7 mg/dL — ABNORMAL LOW (ref 8.9–10.3)
Chloride: 101 mmol/L (ref 98–111)
Creatinine, Ser: 1.02 mg/dL (ref 0.61–1.24)
GFR, Estimated: >60 mL/min (ref >60)
Glucose, Bld: 201 mg/dL — ABNORMAL HIGH (ref 70–99)
Potassium: 4.7 mmol/L (ref 3.5–5.1)
Sodium: 140 mmol/L (ref 135–145)

## 2020-06-28 LAB — CBC
HCT: 34.7 % — ABNORMAL LOW (ref 39.0–52.0)
Hemoglobin: 10.1 g/dL — ABNORMAL LOW (ref 13.0–17.0)
MCH: 24.5 pg — ABNORMAL LOW (ref 26.0–34.0)
MCHC: 29.1 g/dL — ABNORMAL LOW (ref 30.0–36.0)
MCV: 84 fL (ref 80.0–100.0)
Platelets: 270 10*3/uL (ref 150–400)
RBC: 4.13 MIL/uL — ABNORMAL LOW (ref 4.22–5.81)
RDW: 15.9 % — ABNORMAL HIGH (ref 11.5–15.5)
WBC: 7.1 10*3/uL (ref 4.0–10.5)
nRBC: 0 % (ref 0.0–0.2)

## 2020-06-28 LAB — SARS CORONAVIRUS 2 BY RT PCR (HOSPITAL ORDER, PERFORMED IN ~~LOC~~ HOSPITAL LAB): SARS Coronavirus 2: NEGATIVE

## 2020-06-28 SURGERY — LAPAROSCOPIC CHOLECYSTECTOMY
Anesthesia: General

## 2020-06-28 MED ORDER — LACTATED RINGERS IV SOLN: INTRAVENOUS | Status: DC

## 2020-06-28 MED ORDER — BUPIVACAINE HCL (PF) 0.5 % IJ SOLN
INTRAMUSCULAR | Status: DC | PRN
  Administered 2020-06-28: 20 mL

## 2020-06-28 MED ORDER — ONDANSETRON HCL 4 MG/2ML IJ SOLN
INTRAMUSCULAR | Status: AC
  Filled 2020-06-28: qty 2

## 2020-06-28 MED ORDER — FENTANYL CITRATE (PF) 100 MCG/2ML IJ SOLN
INTRAMUSCULAR | Status: AC
  Filled 2020-06-28: qty 2

## 2020-06-28 MED ORDER — EPHEDRINE SULFATE 50 MG/ML IJ SOLN
INTRAMUSCULAR | Status: DC | PRN
  Administered 2020-06-28: 15 mg via INTRAVENOUS

## 2020-06-28 MED ORDER — CHLORHEXIDINE GLUCONATE 0.12 % MT SOLN
15.0000 mL | Freq: Once | OROMUCOSAL | Status: AC
  Administered 2020-06-28: 15 mL via OROMUCOSAL

## 2020-06-28 MED ORDER — CHLORHEXIDINE GLUCONATE CLOTH 2 % EX PADS: 6.0000 | MEDICATED_PAD | Freq: Once | CUTANEOUS | Status: DC

## 2020-06-28 MED ORDER — DEXAMETHASONE SODIUM PHOSPHATE 10 MG/ML IJ SOLN
INTRAMUSCULAR | Status: AC
  Filled 2020-06-28: qty 1

## 2020-06-28 MED ORDER — VENLAFAXINE HCL ER 150 MG PO CP24: 150.0000 mg | ORAL_CAPSULE | Freq: Every day | ORAL | Status: DC

## 2020-06-28 MED ORDER — ENOXAPARIN SODIUM 40 MG/0.4ML ~~LOC~~ SOLN
40.0000 mg | SUBCUTANEOUS | Status: DC
  Administered 2020-06-29: 40 mg via SUBCUTANEOUS
  Filled 2020-06-28: qty 0.4

## 2020-06-28 MED ORDER — ONDANSETRON HCL 4 MG/2ML IJ SOLN: 4.0000 mg | Freq: Four times a day (QID) | INTRAMUSCULAR | Status: DC | PRN

## 2020-06-28 MED ORDER — ALUM & MAG HYDROXIDE-SIMETH 200-200-20 MG/5ML PO SUSP
30.0000 mL | ORAL | Status: DC | PRN
  Administered 2020-06-28: 30 mL via ORAL
  Filled 2020-06-28: qty 30

## 2020-06-28 MED ORDER — PROPOFOL 10 MG/ML IV BOLUS
INTRAVENOUS | Status: AC
  Filled 2020-06-28: qty 20

## 2020-06-28 MED ORDER — ASCORBIC ACID 500 MG PO TABS
1000.0000 mg | ORAL_TABLET | Freq: Every day | ORAL | Status: DC
Start: 2020-06-28 — End: 2020-06-29
  Administered 2020-06-28 – 2020-06-29 (×2): 1000 mg via ORAL
  Filled 2020-06-28 (×2): qty 2

## 2020-06-28 MED ORDER — SENNOSIDES-DOCUSATE SODIUM 8.6-50 MG PO TABS: 1.0000 | ORAL_TABLET | Freq: Every evening | ORAL | Status: DC | PRN

## 2020-06-28 MED ORDER — IPRATROPIUM-ALBUTEROL 0.5-2.5 (3) MG/3ML IN SOLN: 3.0000 mL | Freq: Four times a day (QID) | RESPIRATORY_TRACT | Status: DC | PRN

## 2020-06-28 MED ORDER — FERROUS SULFATE 325 (65 FE) MG PO TABS
325.0000 mg | ORAL_TABLET | Freq: Every day | ORAL | Status: DC
Start: 2020-06-29 — End: 2020-06-29
  Administered 2020-06-29: 325 mg via ORAL
  Filled 2020-06-28: qty 1

## 2020-06-28 MED ORDER — PHENYLEPHRINE 40 MCG/ML (10ML) SYRINGE FOR IV PUSH (FOR BLOOD PRESSURE SUPPORT)
PREFILLED_SYRINGE | INTRAVENOUS | Status: AC
  Filled 2020-06-28: qty 10

## 2020-06-28 MED ORDER — MEPERIDINE HCL 50 MG/ML IJ SOLN
6.2500 mg | INTRAMUSCULAR | Status: DC | PRN
Start: 2020-06-28 — End: 2020-06-28

## 2020-06-28 MED ORDER — SODIUM CHLORIDE 0.9 % IR SOLN
Status: DC | PRN
  Administered 2020-06-28: 1000 mL

## 2020-06-28 MED ORDER — TERAZOSIN HCL 5 MG PO CAPS
5.0000 mg | ORAL_CAPSULE | Freq: Every day | ORAL | Status: DC
  Administered 2020-06-28: 5 mg via ORAL
  Filled 2020-06-28: qty 1

## 2020-06-28 MED ORDER — LIDOCAINE HCL (PF) 2 % IJ SOLN
INTRAMUSCULAR | Status: AC
  Filled 2020-06-28: qty 5

## 2020-06-28 MED ORDER — ROCURONIUM BROMIDE 10 MG/ML (PF) SYRINGE
PREFILLED_SYRINGE | INTRAVENOUS | Status: DC | PRN
  Administered 2020-06-28: 50 mg via INTRAVENOUS

## 2020-06-28 MED ORDER — METOPROLOL TARTRATE 25 MG PO TABS
25.0000 mg | ORAL_TABLET | Freq: Two times a day (BID) | ORAL | Status: DC
Start: 2020-06-28 — End: 2020-06-29
  Administered 2020-06-28 – 2020-06-29 (×3): 25 mg via ORAL
  Filled 2020-06-28 (×3): qty 1

## 2020-06-28 MED ORDER — ONDANSETRON HCL 4 MG PO TABS
4.0000 mg | ORAL_TABLET | Freq: Four times a day (QID) | ORAL | Status: DC | PRN
  Filled 2020-06-28: qty 1

## 2020-06-28 MED ORDER — HEMOSTATIC AGENTS (NO CHARGE) OPTIME
TOPICAL | Status: DC | PRN
  Administered 2020-06-28 (×3): 1 via TOPICAL

## 2020-06-28 MED ORDER — PROSOURCE PLUS PO LIQD
60.0000 mL | Freq: Three times a day (TID) | ORAL | Status: DC
  Filled 2020-06-28: qty 60

## 2020-06-28 MED ORDER — OXYCODONE HCL 5 MG PO TABS
5.0000 mg | ORAL_TABLET | ORAL | Status: DC | PRN
  Administered 2020-06-28: 5 mg via ORAL
  Filled 2020-06-28: qty 1

## 2020-06-28 MED ORDER — ONDANSETRON HCL 4 MG/2ML IJ SOLN: 4.0000 mg | Freq: Once | INTRAMUSCULAR | Status: DC | PRN

## 2020-06-28 MED ORDER — PROPOFOL 10 MG/ML IV BOLUS
INTRAVENOUS | Status: DC | PRN
  Administered 2020-06-28: 80 mg via INTRAVENOUS

## 2020-06-28 MED ORDER — ROPINIROLE HCL 0.25 MG PO TABS
0.2500 mg | ORAL_TABLET | Freq: Every day | ORAL | Status: DC
Start: 2020-06-28 — End: 2020-06-29
  Administered 2020-06-28: 0.25 mg via ORAL
  Filled 2020-06-28: qty 1

## 2020-06-28 MED ORDER — SODIUM CHLORIDE 0.9 % IV SOLN
2.0000 g | INTRAVENOUS | Status: AC
  Administered 2020-06-28: 2 g via INTRAVENOUS
  Filled 2020-06-28: qty 2

## 2020-06-28 MED ORDER — GLYCOPYRROLATE 0.2 MG/ML IJ SOLN
INTRAMUSCULAR | Status: DC | PRN
  Administered 2020-06-28: .2 mg via INTRAVENOUS

## 2020-06-28 MED ORDER — KETAMINE HCL 50 MG/5ML IJ SOSY
PREFILLED_SYRINGE | INTRAMUSCULAR | Status: AC
  Filled 2020-06-28: qty 5

## 2020-06-28 MED ORDER — LIDOCAINE HCL (CARDIAC) PF 100 MG/5ML IV SOSY
PREFILLED_SYRINGE | INTRAVENOUS | Status: DC | PRN
  Administered 2020-06-28: 100 mg via INTRAVENOUS

## 2020-06-28 MED ORDER — ACETAMINOPHEN 500 MG PO TABS
1000.0000 mg | ORAL_TABLET | Freq: Three times a day (TID) | ORAL | Status: DC
  Administered 2020-06-28 – 2020-06-29 (×2): 1000 mg via ORAL
  Filled 2020-06-28 (×2): qty 2

## 2020-06-28 MED ORDER — POTASSIUM CHLORIDE CRYS ER 20 MEQ PO TBCR
20.0000 meq | EXTENDED_RELEASE_TABLET | Freq: Two times a day (BID) | ORAL | Status: DC
  Administered 2020-06-28 – 2020-06-29 (×2): 20 meq via ORAL
  Filled 2020-06-28 (×2): qty 1

## 2020-06-28 MED ORDER — SUGAMMADEX SODIUM 200 MG/2ML IV SOLN
INTRAVENOUS | Status: DC | PRN
  Administered 2020-06-28: 200 mg via INTRAVENOUS

## 2020-06-28 MED ORDER — SUCCINYLCHOLINE CHLORIDE 200 MG/10ML IV SOSY
PREFILLED_SYRINGE | INTRAVENOUS | Status: AC
  Filled 2020-06-28: qty 10

## 2020-06-28 MED ORDER — SUCCINYLCHOLINE CHLORIDE 20 MG/ML IJ SOLN
INTRAMUSCULAR | Status: DC | PRN
  Administered 2020-06-28: 140 mg via INTRAVENOUS

## 2020-06-28 MED ORDER — DILTIAZEM HCL ER COATED BEADS 180 MG PO CP24
360.0000 mg | ORAL_CAPSULE | Freq: Every day | ORAL | Status: DC
Start: 2020-06-29 — End: 2020-06-29
  Administered 2020-06-29: 360 mg via ORAL
  Filled 2020-06-28: qty 2

## 2020-06-28 MED ORDER — METOPROLOL TARTRATE 5 MG/5ML IV SOLN
5.0000 mg | Freq: Four times a day (QID) | INTRAVENOUS | Status: DC | PRN
Start: 2020-06-28 — End: 2020-06-29

## 2020-06-28 MED ORDER — HYDROMORPHONE HCL 1 MG/ML IJ SOLN
0.2500 mg | INTRAMUSCULAR | Status: DC | PRN
  Administered 2020-06-28: 0.5 mg via INTRAVENOUS
  Filled 2020-06-28: qty 0.5

## 2020-06-28 MED ORDER — MEMANTINE HCL 10 MG PO TABS
10.0000 mg | ORAL_TABLET | Freq: Two times a day (BID) | ORAL | Status: DC
  Administered 2020-06-28 – 2020-06-29 (×2): 10 mg via ORAL
  Filled 2020-06-28 (×2): qty 1

## 2020-06-28 MED ORDER — MORPHINE SULFATE (PF) 2 MG/ML IV SOLN: 2.0000 mg | INTRAVENOUS | Status: DC | PRN

## 2020-06-28 MED ORDER — PANTOPRAZOLE SODIUM 40 MG PO TBEC
40.0000 mg | DELAYED_RELEASE_TABLET | Freq: Every day | ORAL | Status: DC
  Administered 2020-06-29: 40 mg via ORAL
  Filled 2020-06-28: qty 1

## 2020-06-28 MED ORDER — ALFUZOSIN HCL ER 10 MG PO TB24
10.0000 mg | ORAL_TABLET | Freq: Every day | ORAL | Status: DC
Start: 2020-06-29 — End: 2020-06-29
  Administered 2020-06-29: 10 mg via ORAL
  Filled 2020-06-28 (×2): qty 1

## 2020-06-28 MED ORDER — POLYETHYLENE GLYCOL 3350 17 G PO PACK: 17.0000 g | PACK | Freq: Every day | ORAL | Status: DC | PRN

## 2020-06-28 MED ORDER — KETAMINE HCL 10 MG/ML IJ SOLN
INTRAMUSCULAR | Status: DC | PRN
  Administered 2020-06-28: 10 mg via INTRAVENOUS

## 2020-06-28 MED ORDER — EPHEDRINE 5 MG/ML INJ
INTRAVENOUS | Status: AC
  Filled 2020-06-28: qty 10

## 2020-06-28 MED ORDER — ONDANSETRON 4 MG PO TBDP
8.0000 mg | ORAL_TABLET | Freq: Three times a day (TID) | ORAL | Status: DC
  Administered 2020-06-28 – 2020-06-29 (×3): 8 mg via ORAL
  Filled 2020-06-28 (×3): qty 2

## 2020-06-28 MED ORDER — ORAL CARE MOUTH RINSE: 15.0000 mL | Freq: Once | OROMUCOSAL | Status: AC

## 2020-06-28 MED ORDER — FENTANYL CITRATE (PF) 100 MCG/2ML IJ SOLN
INTRAMUSCULAR | Status: DC | PRN
  Administered 2020-06-28 (×2): 50 ug via INTRAVENOUS

## 2020-06-28 MED ORDER — VENLAFAXINE HCL ER 75 MG PO CP24
225.0000 mg | ORAL_CAPSULE | Freq: Every day | ORAL | Status: DC
  Administered 2020-06-29: 225 mg via ORAL
  Filled 2020-06-28: qty 3

## 2020-06-28 SURGICAL SUPPLY — 49 items
ADH SKN CLS APL DERMABOND .7 (GAUZE/BANDAGES/DRESSINGS) ×1
APL PRP STRL LF DISP 70% ISPRP (MISCELLANEOUS) ×1
APL SRG 38 LTWT LNG FL B (MISCELLANEOUS) ×1
APPLICATOR ARISTA FLEXITIP XL (MISCELLANEOUS) ×2 IMPLANT
APPLIER CLIP ROT 10 11.4 M/L (STAPLE) ×2
APR CLP MED LRG 11.4X10 (STAPLE) ×1
BAG RETRIEVAL 10 (BASKET) ×1
BLADE SURG 15 STRL LF DISP TIS (BLADE) ×1 IMPLANT
BLADE SURG 15 STRL SS (BLADE) ×2
CHLORAPREP W/TINT 26 (MISCELLANEOUS) ×2 IMPLANT
CLIP APPLIE ROT 10 11.4 M/L (STAPLE) ×1 IMPLANT
CLOTH BEACON ORANGE TIMEOUT ST (SAFETY) ×2 IMPLANT
COVER LIGHT HANDLE STERIS (MISCELLANEOUS) ×4 IMPLANT
COVER WAND RF STERILE (DRAPES) ×2 IMPLANT
DECANTER SPIKE VIAL GLASS SM (MISCELLANEOUS) ×2 IMPLANT
DERMABOND ADVANCED (GAUZE/BANDAGES/DRESSINGS) ×1
DERMABOND ADVANCED .7 DNX12 (GAUZE/BANDAGES/DRESSINGS) ×1 IMPLANT
ELECT REM PT RETURN 9FT ADLT (ELECTROSURGICAL) ×2
ELECTRODE REM PT RTRN 9FT ADLT (ELECTROSURGICAL) ×1 IMPLANT
GLOVE BIO SURGEON STRL SZ 6.5 (GLOVE) ×2 IMPLANT
GLOVE BIO SURGEON STRL SZ7.5 (GLOVE) ×4 IMPLANT
GLOVE BIOGEL M 6.5 STRL (GLOVE) ×2 IMPLANT
GLOVE BIOGEL M STER SZ 6 (GLOVE) ×2 IMPLANT
GLOVE BIOGEL PI IND STRL 6.5 (GLOVE) ×1 IMPLANT
GLOVE BIOGEL PI IND STRL 7.0 (GLOVE) ×3 IMPLANT
GLOVE BIOGEL PI INDICATOR 6.5 (GLOVE) ×1
GLOVE BIOGEL PI INDICATOR 7.0 (GLOVE) ×3
GOWN STRL REUS W/TWL LRG LVL3 (GOWN DISPOSABLE) ×6 IMPLANT
HEMOSTAT ARISTA ABSORB 3G PWDR (HEMOSTASIS) ×4 IMPLANT
HEMOSTAT SNOW SURGICEL 2X4 (HEMOSTASIS) ×2 IMPLANT
INST SET LAPROSCOPIC AP (KITS) ×2 IMPLANT
KIT TURNOVER KIT A (KITS) ×2 IMPLANT
MANIFOLD NEPTUNE II (INSTRUMENTS) ×2 IMPLANT
NEEDLE INSUFFLATION 14GA 120MM (NEEDLE) ×2 IMPLANT
NS IRRIG 1000ML POUR BTL (IV SOLUTION) ×2 IMPLANT
PACK LAP CHOLE LZT030E (CUSTOM PROCEDURE TRAY) ×2 IMPLANT
PAD ARMBOARD 7.5X6 YLW CONV (MISCELLANEOUS) ×2 IMPLANT
SET BASIN LINEN APH (SET/KITS/TRAYS/PACK) ×2 IMPLANT
SET TUBE SMOKE EVAC HIGH FLOW (TUBING) ×2 IMPLANT
SLEEVE ENDOPATH XCEL 5M (ENDOMECHANICALS) ×2 IMPLANT
SUT MNCRL AB 4-0 PS2 18 (SUTURE) ×4 IMPLANT
SUT VICRYL 0 UR6 27IN ABS (SUTURE) ×2 IMPLANT
SYS BAG RETRIEVAL 10MM (BASKET) ×1
SYSTEM BAG RETRIEVAL 10MM (BASKET) ×1 IMPLANT
TROCAR ENDO BLADELESS 11MM (ENDOMECHANICALS) ×2 IMPLANT
TROCAR XCEL NON-BLD 5MMX100MML (ENDOMECHANICALS) ×2 IMPLANT
TROCAR XCEL UNIV SLVE 11M 100M (ENDOMECHANICALS) ×2 IMPLANT
TUBE CONNECTING 12X1/4 (SUCTIONS) ×2 IMPLANT
WARMER LAPAROSCOPE (MISCELLANEOUS) ×2 IMPLANT

## 2020-06-28 NOTE — Interval H&P Note (Signed)
History and Physical Interval Note:  06/28/2020 9:17 AM  Jeffrey Sherman  has presented today for surgery, with the diagnosis of Biliary dyskensia.  The various methods of treatment have been discussed with the patient and family. After consideration of risks, benefits and other options for treatment, the patient has consented to  Procedure(s) with comments: LAPAROSCOPIC CHOLECYSTECTOMY (N/A) - pt in [facility] as a surgical intervention.  The patient's history has been reviewed, patient examined, no change in status, stable for surgery.  I have reviewed the patient's chart and labs.  Questions were answered to the patient's satisfaction.    No changes. Still with abdominal pain.   [name]

## 2020-06-28 NOTE — Op Note (Signed)
Operative Note   Preoperative Diagnosis:Biliary dyskinesia    Postoperative Diagnosis: Same   Procedure(s) Performed: Laparoscopic cholecystectomy   Surgeon: Ria Comment C. Constance Haw, MD   Assistants: Aviva Signs MD    Anesthesia: General endotracheal   Anesthesiologist: Denese Killings, MD    Specimens: Gallbladder    Estimated Blood Loss: Minimal    Blood Replacement: None    Complications: None    Operative Findings: Distended gallbladder with some edema    Procedure: The patient was taken to the operating room and placed supine. General endotracheal anesthesia was induced. Intravenous antibiotics were administered per protocol. An orogastric tube positioned to decompress the stomach. The abdomen was prepared and draped in the usual sterile fashion.    A supraumbilical incision was made and a Veress technique was utilized to achieve pneumoperitoneum to 15 mmHg with carbon dioxide. A 11 mm optiview port was placed through the supraumbilical region, and a 10 mm 0-degree operative laparoscope was introduced. The area underlying the trocar and Veress needle were inspected and without evidence of injury.  Remaining trocars were placed under direct vision. Two 5 mm ports were placed in the right abdomen, between the anterior axillary and midclavicular line.  A final 11 mm port was placed through the mid-epigastrium, near the falciform ligament.    Omental adhesions were stripped down from the gallbladder. The gallbladder was very distended and full and was decompressed of bile.  The gallbladder fundus was elevated cephalad and the infundibulum was retracted to the patient's right. A dome down of the gallbladder fossa was down to help with retraction of the gallbladder given the size.  Then after this was done the gallbladder/cystic duct junction was skeletonized. The cystic artery noted in the triangle of Calot and was also skeletonized.  We then continued liberal medial and lateral  dissection until the critical view of safety was achieved where two structures and the hepatic bed were seen.    The cystic duct and cystic artery were triply clipped and divided. The gallbladder was then dissected from the liver bed with electrocautery. The specimen was placed in an Endopouch and was retrieved through the epigastric site.   Final inspection revealed acceptable hemostasis. Surgical Arista and SNOW was placed in the gallbladder bed.  Trocars were removed and pneumoperitoneum was released.  0 Vicryl fascial sutures were used to close the epigastric and umbilical port sites. Skin incisions were closed with 4-0 Monocryl subcuticular sutures and Dermabond. The patient was awakened from anesthesia and extubated without complication.    Curlene Labrum, MD [facility] [address] [place], [place] [address] [phone] (office)

## 2020-06-28 NOTE — Progress Notes (Signed)
[facility]  Notified hospitalist of consult to help with admission given comorbidities etc. Patient with some O2 requirements post op but only 1-2 liters. Hopefully can wean and get off in the next 24-48 hrs. Should be able to go back to [facility] center after. Updated Daughter in law.  Curlene Labrum, MD [facility] [address] [place], [place] [address] [phone] (office)

## 2020-06-28 NOTE — Anesthesia Preprocedure Evaluation (Addendum)
Anesthesia Evaluation  Patient identified by MRN, date of birth, ID band Patient awake    Reviewed: Allergy & Precautions, NPO status , Patient's Chart, lab work & pertinent test results, reviewed documented beta blocker date and time   History of Anesthesia Complications Negative for: history of anesthetic complications  Airway Mallampati: II  TM Distance: >3 FB Neck ROM: Full    Dental  (+) Dental Advisory Given, Edentulous Upper, Missing, Poor Dentition   Pulmonary shortness of breath, with exertion and at rest,  Room air saturations 90 to 91. patient can't take deep breaths because of abdominal pain.   Pulmonary exam normal breath sounds clear to auscultation       Cardiovascular Exercise Tolerance: Poor hypertension, Pt. on medications and Pt. on home beta blockers + dysrhythmias Atrial Fibrillation + Valvular Problems/Murmurs  Rhythm:Irregular Rate:Normal - Systolic murmurs, - Diastolic murmurs, - Friction Rub, - Carotid Bruit, - Peripheral Edema and - Systolic Click    Neuro/Psych PSYCHIATRIC DISORDERS Anxiety Depression    GI/Hepatic PUD, GERD  Medicated and Controlled,  Endo/Other    Renal/GU Renal InsufficiencyRenal disease     Musculoskeletal   Abdominal   Peds  Hematology  (+) anemia ,   Anesthesia Other Findings   Reproductive/Obstetrics                             Anesthesia Physical Anesthesia Plan  ASA: IV  Anesthesia Plan: General   Post-op Pain Management:    Induction: Intravenous  PONV Risk Score and Plan: 4 or greater and Ondansetron and Dexamethasone  Airway Management Planned: Oral ETT  Additional Equipment:   Intra-op Plan:   Post-operative Plan: Extubation in OR  Informed Consent: I have reviewed the patients History and Physical, chart, labs and discussed the procedure including the risks, benefits and alternatives for the proposed anesthesia with  the patient or authorized representative who has indicated his/her understanding and acceptance.     Dental advisory given  Plan Discussed with: CRNA and Surgeon  Anesthesia Plan Comments:         Anesthesia Quick Evaluation

## 2020-06-28 NOTE — Anesthesia Procedure Notes (Signed)
Procedure Name: Intubation Performed by: Tacy Learn, CRNA Pre-anesthesia Checklist: Patient identified, Emergency Drugs available, Suction available, Patient being monitored and Timeout performed Patient Re-evaluated:Patient Re-evaluated prior to induction Oxygen Delivery Method: Circle system utilized Preoxygenation: Pre-oxygenation with 100% oxygen Induction Type: IV induction Laryngoscope Size: 3 Grade View: Grade I Tube size: 8.0 mm Number of attempts: 1 Airway Equipment and Method: Stylet Placement Confirmation: ETT inserted through vocal cords under direct vision,  positive ETCO2,  CO2 detector and breath sounds checked- equal and bilateral Secured at: 23 cm Tube secured with: Tape Dental Injury: Teeth and Oropharynx as per pre-operative assessment

## 2020-06-28 NOTE — Progress Notes (Signed)
Location:  [facility] Room Number: 156/D Place of Service:  SNF (31)   CODE STATUS: DNR  No Known Allergies  Chief Complaint  Patient presents with  . Short Term Rehab (STR)    Routine Visit    HPI:    Past Medical History:  Diagnosis Date  . Anxiety   . Cancer [facility])    prostate  . Chronic chest wall pain   . Essential hypertension, benign 06/20/2016  . GAD (generalized anxiety disorder)   . GERD (gastroesophageal reflux disease)   . H/O echocardiogram 2016   normal  . Heart murmur    "leaking heart valve"  . High cholesterol 06/20/2016  . HOH (hard of hearing)    left  . Hx of falling   . Hyperlipidemia   . Interstitial pulmonary disease (HCC)   . MDD (major depressive disorder)   . Murmur, cardiac   . Poor historian   . PUD (peptic ulcer disease)   . RLS (restless legs syndrome)   . Shortness of breath   . Wrist fracture 12/16/2011    Past Surgical History:  Procedure Laterality Date  . COLONOSCOPY  2005   Rehman: Sigmoid colon diverticulosis, moderate sized external hemorrhoids. A few focal areas of mucosal erythema felt to be nonspecific.  Marland Kitchen ESOPHAGOGASTRODUODENOSCOPY    . ESOPHAGOGASTRODUODENOSCOPY N/A 07/18/2016   Rehman: normal exam except duodenal scar. h.pylori serologies negative  . FLEXIBLE SIGMOIDOSCOPY N/A 06/11/2020   Procedure: FLEXIBLE SIGMOIDOSCOPY;  Surgeon: Lanelle Bal, DO;  Location: [facility] ENDO SU[facility];  Service: Endoscopy;  Laterality: N/A;  . HIP PINNING,CANNULATED Right 03/10/2020   Procedure: INTERNAL FIXATION RIGHT HIP;  Surgeon: Vickki Hearing, MD;  Location: [facility] ORS;  Service: Orthopedics;  Laterality: Right;  . ORIF WRIST FRACTURE  12/19/2011   Procedure: OPEN REDUCTION INTERNAL FIXATION (ORIF) WRIST FRACTURE;  Surgeon: Vickki Hearing, MD;  Location: [facility] ORS;  Service: Orthopedics;  Laterality: Right;  . prostate cancer     Diagnosed in the 90s  . TONSILLECTOMY      Social History   Socioeconomic  History  . Marital status: Widowed    Spouse name: Not on file  . Number of children: Not on file  . Years of education: Not on file  . Highest education level: Not on file  Occupational History  . Occupation: retired    Associate Professor: RETIRED  Tobacco Use  . Smoking status: Never Smoker  . Smokeless tobacco: Never Used  Vaping Use  . Vaping Use: Never used  Substance and Sexual Activity  . Alcohol use: No  . Drug use: No  . Sexual activity: Not on file  Other Topics Concern  . Not on file  Social History Narrative  . Not on file   Social Determinants of Health   Financial Resource Strain: Not on file  Food Insecurity: Not on file  Transportation Needs: Not on file  Physical Activity: Not on file  Stress: Not on file  Social Connections: Not on file  Intimate Partner Violence: Not on file   Family History  Problem Relation Age of Onset  . Cancer Other   . Heart attack Mother   . Cancer Brother   . Heart attack Brother   . Heart disease Sister        by pass      VITAL SIGNS BP (!) 144/66   Pulse 68   Temp 98.1 F (36.7 C)   Resp 20   Ht 5\' 9"  (  1.753 m)   Wt 202 lb 3.2 oz (91.7 kg)   SpO2 93%   BMI 29.86 kg/m   Outpatient Encounter Medications as of 06/28/2020  Medication Sig  . acetaminophen (TYLENOL) 500 MG tablet Take 1,000 mg by mouth in the morning, at noon, and at bedtime.  Marland Kitchen alfuzosin (UROXATRAL) 10 MG 24 hr tablet Take 10 mg by mouth daily with breakfast.  . Amino Acids-Protein Hydrolys (PRO-STAT PO) Take 60 mLs by mouth 3 (three) times daily with meals.  . Ascorbic Acid (VITAMIN C) 1000 MG tablet Take 1,000 mg by mouth daily.  Marland Kitchen aspirin 81 MG chewable tablet Chew 81 mg by mouth daily.  Roseanne Kaufman [place] Oil [facility]) OINT Special Instructions: Apply to bilateral buttocks & sacral area qshift for blanchable erythema.  . calcium carbonate (TUMS - DOSED IN MG ELEMENTAL CALCIUM) 500 MG chewable tablet Chew 1 tablet by mouth 2 (two) times daily as  needed for indigestion or heartburn.   . diltiazem (CARDIZEM CD) 360 MG 24 hr capsule Take 360 mg by mouth daily. Start 03/23/2020  . ferrous sulfate 325 (65 FE) MG tablet Take 325 mg by mouth daily with breakfast. For Anemia and  Iron Deficiency  . Flax Oil-Fish Oil-Borage Oil (FISH OIL-FLAX OIL-BORAGE OIL) CAPS Take 1 capsule by mouth daily.  Marland Kitchen ipratropium-albuterol (DUONEB) 0.5-2.5 (3) MG/3ML SOLN Take 3 mLs by nebulization every 6 (six) hours as needed.  Marland Kitchen lisinopril (ZESTRIL) 40 MG tablet Take 1 tablet (40 mg total) by mouth daily.  Marland Kitchen loperamide (IMODIUM A-D) 2 MG tablet amt: 2mg ; oral Special Instructions: after each stool do not exceed more than 4tabs in 24 hours, As Needed  . Melatonin 10 MG TABS Take 10 mg by mouth at bedtime.  . memantine (NAMENDA) 10 MG tablet Take 10 mg by mouth 2 (two) times daily.  . metoprolol tartrate (LOPRESSOR) 25 MG tablet Take 1 tablet (25 mg total) by mouth 2 (two) times daily.  . NON FORMULARY Diet - Mechanical Soft _ [place] diet only. (EX: Bread, rice, toast, crackers, applesauce, mashed potatoes, BRAT diet, avoid fatty foods)  . omeprazole (PRILOSEC) 20 MG capsule Take 1 capsule (20 mg total) by mouth daily before breakfast.  . ondansetron (ZOFRAN-ODT) 8 MG disintegrating tablet Take 8 mg by mouth 3 (three) times daily before meals. For vomiting and nausea  . polyethylene glycol (MIRALAX / GLYCOLAX) 17 g packet Take 17 g by mouth daily as needed.  . potassium chloride SA (KLOR-CON) 20 MEQ tablet Take 20 mEq by mouth 2 (two) times daily.   Marland Kitchen rOPINIRole (REQUIP) 0.25 MG tablet Take 0.25 mg by mouth at bedtime.  . salicylic acid 17 % gel Apply 1 application topically in the morning and at bedtime. Apply to warts to left hand  . senna-docusate (SENOKOT-S) 8.6-50 MG tablet Take 1 tablet by mouth at bedtime as needed for mild constipation.  . simethicone (MYLICON) 916 MG chewable tablet Chew 125 mg by mouth in the morning, at noon, in the evening, and at bedtime.   Marland Kitchen terazosin (HYTRIN) 5 MG capsule Take 5 mg by mouth at bedtime.   Marland Kitchen venlafaxine XR (EFFEXOR-XR) 150 MG 24 hr capsule Take 150 mg by mouth daily with breakfast. Along with 75 mg to = 225 mg  . venlafaxine XR (EFFEXOR-XR) 75 MG 24 hr capsule Take 75 mg by mouth daily with breakfast. Take along with 150 mg to = 225 mg   No facility-administered encounter medications on file as of 06/28/2020.  SIGNIFICANT DIAGNOSTIC EXAMS       ASSESSMENT/ PLAN:    MD is aware of resident's narcotic use and is in agreement with current plan of care. We will attempt to wean resident as appropriate.  Ok Edwards NP [facility]  Contact [phone] Monday through Friday 8am- 5pm  After hours call [phone]   This encounter was created in error - please disregard.

## 2020-06-28 NOTE — Plan of Care (Signed)

## 2020-06-28 NOTE — Consult Note (Signed)
Reason for Consult: evaluation and management of medical problems during hospitalization Referring Physician: Constance Haw MD    HPI:  Jeffrey Sherman is an 87 y.o. male with longstanding ILD on home oxygen, RLS, dementia, wheelchair bound for last 6 months, longterm resident of [facility], history of prostate cancer, HTN, PVD, recent admission for ischemic colitis and hematochezia was admitted for treatment of biliary colic by surgery team.  He had lap chole performed by Dr. Constance Haw 1/19 for biliary dyskinesia.  He is being admitted postoperatively for observation.  He is requiring 2L/min [place] oxygen.    Past Medical History:  Diagnosis Date  . Anxiety   . Cancer [facility])    prostate  . Chronic chest wall pain   . Essential hypertension, benign 06/20/2016  . GAD (generalized anxiety disorder)   . GERD (gastroesophageal reflux disease)   . H/O echocardiogram 2016   normal  . Heart murmur    "leaking heart valve"  . High cholesterol 06/20/2016  . HOH (hard of hearing)    left  . Hx of falling   . Hyperlipidemia   . Interstitial pulmonary disease ([place])   . MDD (major depressive disorder)   . Murmur, cardiac   . Poor historian   . PUD (peptic ulcer disease)   . RLS (restless legs syndrome)   . Shortness of breath   . Wrist fracture 12/16/2011    Past Surgical History:  Procedure Laterality Date  . COLONOSCOPY  2005   Rehman: Sigmoid colon diverticulosis, moderate sized external hemorrhoids. A few focal areas of mucosal erythema felt to be nonspecific.  Marland Kitchen ESOPHAGOGASTRODUODENOSCOPY    . ESOPHAGOGASTRODUODENOSCOPY N/A 07/18/2016   Rehman: normal exam except duodenal scar. h.pylori serologies negative  . FLEXIBLE SIGMOIDOSCOPY N/A 06/11/2020   Procedure: FLEXIBLE SIGMOIDOSCOPY;  Surgeon: Eloise Harman, DO;  [facility];  Service: Endoscopy;  Laterality: N/A;  . HIP PINNING,CANNULATED Right 03/10/2020   Procedure: INTERNAL FIXATION RIGHT HIP;  Surgeon: Carole Civil, MD;   Location: [facility] ORS;  Service: Orthopedics;  Laterality: Right;  . ORIF WRIST FRACTURE  12/19/2011   Procedure: OPEN REDUCTION INTERNAL FIXATION (ORIF) WRIST FRACTURE;  Surgeon: Carole Civil, MD;  Location: [facility] ORS;  Service: Orthopedics;  Laterality: Right;  . prostate cancer     Diagnosed in the 90s  . TONSILLECTOMY      Family History  Problem Relation Age of Onset  . Cancer Other   . Heart attack Mother   . Cancer Brother   . Heart attack Brother   . Heart disease Sister        by pass    Social History:  reports that he has never smoked. He has never used smokeless tobacco. He reports that he does not drink alcohol and does not use drugs.  Allergies: No Known Allergies  Medications:  Prior to Admission medications   Medication Sig Start Date End Date Taking? Authorizing Provider  alfuzosin (UROXATRAL) 10 MG 24 hr tablet Take 10 mg by mouth daily with breakfast.   Yes [provider]  Amino Acids-Protein Hydrolys (PRO-STAT PO) Take 60 mLs by mouth 3 (three) times daily with meals. 06/14/20  Yes [provider]  Ascorbic Acid (VITAMIN C) 1000 MG tablet Take 1,000 mg by mouth daily.   Yes [provider]  aspirin 81 MG chewable tablet Chew 81 mg by mouth daily. 04/15/20  Yes [provider]  calcium carbonate (TUMS - DOSED IN MG ELEMENTAL CALCIUM) 500 MG chewable  tablet Chew 1 tablet by mouth 2 (two) times daily as needed for indigestion or heartburn.    Yes [provider]  diltiazem (CARDIZEM CD) 360 MG 24 hr capsule Take 360 mg by mouth daily. Start 03/23/2020   Yes [provider]  ferrous sulfate 325 (65 FE) MG tablet Take 325 mg by mouth daily with breakfast. For Anemia and  Iron Deficiency 03/17/20  Yes [provider]  Flax Oil-Fish Oil-Borage Oil (FISH OIL-FLAX OIL-BORAGE OIL) CAPS Take 1 capsule by mouth daily.   Yes [provider]  Melatonin 10 MG TABS Take 10 mg by mouth at bedtime.   Yes [provider]  memantine (NAMENDA) 10 MG tablet Take 10 mg by mouth 2 (two) times daily.   Yes [provider]  metoprolol tartrate (LOPRESSOR) 25 MG tablet Take 1 tablet (25 mg total) by mouth 2 (two) times daily. 06/01/20  Yes Luane Rochon L, MD  omeprazole (PRILOSEC) 20 MG capsule Take 1 capsule (20 mg total) by mouth daily before breakfast. 03/14/20  Yes Jermey Closs L, MD  ondansetron (ZOFRAN-ODT) 8 MG disintegrating tablet Take 8 mg by mouth 3 (three) times daily before meals. For vomiting and nausea 06/07/20  Yes [provider]  potassium chloride SA (KLOR-CON) 20 MEQ tablet Take 20 mEq by mouth 2 (two) times daily.  03/23/20  Yes [provider]  rOPINIRole (REQUIP) 0.25 MG tablet Take 0.25 mg by mouth at bedtime. 04/21/20  Yes [provider]  terazosin (HYTRIN) 5 MG capsule Take 5 mg by mouth at bedtime.  03/14/20  Yes [provider]  venlafaxine XR (EFFEXOR-XR) 150 MG 24 hr capsule Take 150 mg by mouth daily with breakfast. Along with 75 mg to = 225 mg 06/01/20  Yes [provider]  venlafaxine XR (EFFEXOR-XR) 75 MG 24 hr capsule Take 75 mg by mouth daily with breakfast. Take along with 150 mg to = 225 mg 06/20/20  Yes [provider]  acetaminophen (TYLENOL) 500 MG tablet Take 1,000 mg by mouth in the morning, at noon, and at bedtime. 03/20/20   [provider]  Janne Lab Oil [facility]) OINT Special Instructions: Apply to bilateral buttocks & sacral area qshift for blanchable erythema. 03/15/20   [provider]  ipratropium-albuterol (DUONEB) 0.5-2.5 (3) MG/3ML SOLN Take 3 mLs by nebulization every 6 (six) hours as needed.    [provider]  lisinopril (ZESTRIL) 40 MG tablet Take 1 tablet (40 mg total) by mouth daily. 03/14/20   Murlean Iba, MD  loperamide (IMODIUM A-D) 2 MG tablet amt: 2mg ; oral Special Instructions: after each stool do not exceed more than 4tabs in 24  hours, As Needed 06/01/20   [provider]  NON FORMULARY Diet - Mechanical Soft _ [place] only. (EX: Bread, rice, toast, crackers, applesauce, mashed potatoes, BRAT diet, avoid fatty foods) 06/14/20   [provider]  polyethylene glycol (MIRALAX / GLYCOLAX) 17 g packet Take 17 g by mouth daily as needed. 06/01/20   [provider]  salicylic acid 17 % gel Apply 1 application topically in the morning and at bedtime. Apply to warts to left hand    [provider]  senna-docusate (SENOKOT-S) 8.6-50 MG tablet Take 1 tablet by mouth at bedtime as needed for mild constipation. 03/14/20   Delfin Squillace L, MD  simethicone (MYLICON) 0000000 MG chewable tablet Chew 125 mg by mouth in the morning, at noon, in the evening, and at bedtime. 06/21/20  [provider]     Results for orders placed or performed during the hospital encounter of 06/28/20 (from the past 48 hour(s))  SARS Coronavirus 2 by RT PCR (hospital order, performed in [facility] hospital lab) Nasopharyngeal Nasopharyngeal Swab     Status: None   Collection Time: 06/28/20  7:46 AM   Specimen: Nasopharyngeal Swab  Result Value Ref Range   SARS Coronavirus 2 NEGATIVE NEGATIVE    Comment: (NOTE) SARS-CoV-2 target nucleic acids are NOT DETECTED.  The SARS-CoV-2 RNA is generally detectable in upper and lower respiratory specimens during the acute phase of infection. The lowest concentration of SARS-CoV-2 viral copies this assay can detect is 250 copies / mL. A negative result does not preclude SARS-CoV-2 infection and should not be used as the sole basis for treatment or other patient management decisions.  A negative result may occur with improper specimen collection / handling, submission of specimen other than nasopharyngeal swab, presence of viral mutation(s) within the areas targeted by this assay, and inadequate number of viral copies (<250 copies / mL). A negative result must be combined  with clinical observations, patient history, and epidemiological information.  Fact Sheet for Patients:   StrictlyIdeas.no  Fact Sheet for Healthcare Providers: BankingDealers.co.za  This test is not yet approved or  cleared by the [place] FDA and has been authorized for detection and/or diagnosis of SARS-CoV-2 by FDA under an Emergency Use Authorization (EUA).  This EUA will remain in effect (meaning this test can be used) for the duration of the COVID-19 declaration under Section 564(b)(1) of the Act, 21 U.S.C. section 360bbb-3(b)(1), unless the authorization is terminated or revoked sooner.  Performed at [facility], [address] [place], [place] [address]     No results found.  Review of Systems  Constitutional: Negative.   HENT: Negative.   Eyes: Negative.   Respiratory: Positive for cough and shortness of breath.   Cardiovascular: Negative.   Gastrointestinal: Positive for abdominal pain, nausea and vomiting.  Endocrine: Negative.   Genitourinary: Positive for frequency and urgency.  Musculoskeletal: Negative.   Skin: Negative.   Allergic/Immunologic: Negative.   Neurological: Positive for weakness.  Hematological: Negative.   Psychiatric/Behavioral: Negative.    Blood pressure (!) 160/86, pulse 86, temperature 98.8 F (37.1 C), resp. rate (!) 22, SpO2 93 %. Physical Exam Constitutional:      General: He is not in acute distress.    Appearance: He is diaphoretic. He is not toxic-appearing.  HENT:     Head: Normocephalic and atraumatic.     Nose: Nose normal.     Mouth/Throat:     Mouth: Mucous membranes are dry.     Pharynx: Oropharynx is clear. No oropharyngeal exudate.  Eyes:     General: No scleral icterus.    Pupils: Pupils are equal, round, and reactive to light.  Cardiovascular:     Rate and Rhythm: Tachycardia present.     Heart sounds: Normal heart sounds.  Pulmonary:     Effort: No  respiratory distress.     Breath sounds: No wheezing.  Abdominal:     General: There is no distension.     Palpations: There is no mass.     Tenderness: There is no right CVA tenderness, left CVA tenderness or guarding.  Genitourinary:    Penis: Normal.   Musculoskeletal:        General: No swelling.     Cervical back: No rigidity or tenderness.  Skin:    Coloration:  Skin is not jaundiced.     Findings: No erythema or lesion.  Neurological:     General: No focal deficit present.  Psychiatric:        Mood and Affect: Mood normal.    Physical Exam Constitutional:      General: He is not in acute distress.    Appearance: He is diaphoretic. He is not toxic-appearing.  HENT:     Head: Normocephalic and atraumatic.     Nose: Nose normal.     Mouth/Throat:     Mouth: Mucous membranes are dry.     Pharynx: Oropharynx is clear. No oropharyngeal exudate.  Eyes:     General: No scleral icterus.    Pupils: Pupils are equal, round, and reactive to light.  Cardiovascular:     Rate and Rhythm: Tachycardia present.     Heart sounds: Normal heart sounds.  Pulmonary:     Effort: No respiratory distress.     Breath sounds: No wheezing.  Abdominal:     General: There is no distension.     Palpations: There is no mass.     Tenderness: There is no right CVA tenderness, left CVA tenderness or guarding.  Genitourinary:    Penis: Normal.   Musculoskeletal:        General: No swelling.     Cervical back: No rigidity or tenderness.  Skin:    Coloration: Skin is not jaundiced.     Findings: No erythema or lesion.  Neurological:     General: No focal deficit present.  Psychiatric:        Mood and Affect: Mood normal.    Assessment/Plan:  1. Postop Day #0 s/p lap chole - pt being admitted for observation postop.  He seems to have tolerated procedure well.  Please see surgery postop orders.  Continue to follow.   2. Biliary colic - treated with lap chole.   3. ILD - this is chronic and  longstanding and he has required home oxygen prior to surgery.  4. HTN - IV meds ordered as needed.   5. Dementia - this has been stable.  Follow.  6. RLS - resume home meds when able.  7. History of GERD and PUD - protonix ordered for GI protection.   8. Moderate protein calorie malnutrition - dietitian consult.   Loray Akard 06/28/2020, 2:15 PM  How to contact the [facility] Attending or Consulting provider [place] or covering provider during after hours [place], for this patient?  1. Check the care team in [facility] and look for a) attending/consulting [facility] provider listed and b) the [facility] team listed 2. Log into [email or web address] and use [place]'s universal password to access. If you do not have the password, please contact the hospital operator. 3. Locate the [facility] provider you are looking for under [facility] and page to a number that you can be directly reached. 4. If you still have difficulty reaching the provider, please page the [facility] (Director on Call) for the Hospitalists listed on amion for assistance.

## 2020-06-28 NOTE — Progress Notes (Signed)
[facility]  Notified Jeffrey Sherman that surgery completed. Will monitor in PACU and see if he needs admission given his co-morbidities.   Curlene Labrum, MD [facility] [address] [place], [place] [address] [phone] (office)

## 2020-06-28 NOTE — Progress Notes (Signed)
Talked with [name]-daughter in law, per pt's request. Pt status given. Voiced understanding.

## 2020-06-28 NOTE — Anesthesia Postprocedure Evaluation (Signed)
Anesthesia Post Note  Patient: Jeffrey Sherman  Procedure(s) Performed: LAPAROSCOPIC CHOLECYSTECTOMY (N/A )  Patient location during evaluation: PACU Anesthesia Type: General Level of consciousness: awake, oriented, awake and alert and patient cooperative Pain management: pain level controlled Vital Signs Assessment: post-procedure vital signs reviewed and stable Respiratory status: spontaneous breathing, respiratory function stable, nonlabored ventilation and patient connected to face mask oxygen Cardiovascular status: blood pressure returned to baseline and stable Postop Assessment: no headache and no backache Anesthetic complications: no   No complications documented.   Last Vitals:  Vitals:   06/28/20 0954  Resp: 18  SpO2: 99%    Last Pain:  Vitals:   06/28/20 0954  PainSc: 0-No pain                 Tacy Learn

## 2020-06-28 NOTE — Transfer of Care (Signed)
Immediate Anesthesia Transfer of Care Note  Patient: Jeffrey Sherman  Procedure(s) Performed: LAPAROSCOPIC CHOLECYSTECTOMY (N/A )  Patient Location: PACU  Anesthesia Type:General  Level of Consciousness: awake, alert , oriented and patient cooperative  Airway & Oxygen Therapy: Patient Spontanous Breathing and Patient connected to face mask oxygen  Post-op Assessment: Report given to RN, Post -op Vital signs reviewed and stable and Patient moving all extremities  Post vital signs: Reviewed and stable  Last Vitals:  Vitals Value Taken Time  BP 162/82 06/28/20 1222  Temp    Pulse 79 06/28/20 1226  Resp 28 06/28/20 1226  SpO2 97 % 06/28/20 1226  Vitals shown include unvalidated device data.  Last Pain:  Vitals:   06/28/20 0954  PainSc: 0-No pain         Complications: No complications documented.

## 2020-06-29 ENCOUNTER — Observation Stay (HOSPITAL_COMMUNITY): Payer: Medicare Other

## 2020-06-29 ENCOUNTER — Inpatient Hospital Stay
Admission: RE | Admit: 2020-06-29 | Discharge: 2021-03-20 | Disposition: A | Discharge status: Nursing Home (Includes ICF) | Payer: Medicare Other | Source: Ambulatory Visit | Attending: Internal Medicine | Admitting: Internal Medicine

## 2020-06-29 ENCOUNTER — Encounter (HOSPITAL_COMMUNITY): Payer: Self-pay | Admitting: General Surgery

## 2020-06-29 DIAGNOSIS — K8010 Calculus of gallbladder with chronic cholecystitis without obstruction: Secondary | ICD-10-CM | POA: Diagnosis not present

## 2020-06-29 DIAGNOSIS — Z20822 Contact with and (suspected) exposure to covid-19: Secondary | ICD-10-CM | POA: Diagnosis not present

## 2020-06-29 DIAGNOSIS — Z79899 Other long term (current) drug therapy: Secondary | ICD-10-CM | POA: Diagnosis not present

## 2020-06-29 DIAGNOSIS — I10 Essential (primary) hypertension: Secondary | ICD-10-CM | POA: Diagnosis not present

## 2020-06-29 DIAGNOSIS — K828 Other specified diseases of gallbladder: Secondary | ICD-10-CM | POA: Diagnosis not present

## 2020-06-29 DIAGNOSIS — R0602 Shortness of breath: Secondary | ICD-10-CM | POA: Diagnosis not present

## 2020-06-29 LAB — BASIC METABOLIC PANEL
Anion gap: 7 (ref 5–15)
BUN: 26 mg/dL — ABNORMAL HIGH (ref 8–23)
CO2: 30 mmol/L (ref 22–32)
Calcium: 8.8 mg/dL — ABNORMAL LOW (ref 8.9–10.3)
Chloride: 101 mmol/L (ref 98–111)
Creatinine, Ser: 1.02 mg/dL (ref 0.61–1.24)
GFR, Estimated: >60 mL/min (ref >60)
Glucose, Bld: 152 mg/dL — ABNORMAL HIGH (ref 70–99)
Potassium: 4.4 mmol/L (ref 3.5–5.1)
Sodium: 138 mmol/L (ref 135–145)

## 2020-06-29 LAB — CBC
HCT: 36.1 % — ABNORMAL LOW (ref 39.0–52.0)
Hemoglobin: 10.7 g/dL — ABNORMAL LOW (ref 13.0–17.0)
MCH: 24.6 pg — ABNORMAL LOW (ref 26.0–34.0)
MCHC: 29.6 g/dL — ABNORMAL LOW (ref 30.0–36.0)
MCV: 83 fL (ref 80.0–100.0)
Platelets: 294 10*3/uL (ref 150–400)
RBC: 4.35 MIL/uL (ref 4.22–5.81)
RDW: 15.9 % — ABNORMAL HIGH (ref 11.5–15.5)
WBC: 10.1 10*3/uL (ref 4.0–10.5)
nRBC: 0 % (ref 0.0–0.2)

## 2020-06-29 LAB — MAGNESIUM: Magnesium: 2.1 mg/dL (ref 1.7–2.4)

## 2020-06-29 MED ORDER — HYDRALAZINE HCL 50 MG PO TABS: 50.0000 mg | ORAL_TABLET | Freq: Three times a day (TID) | ORAL | 11 refills | Status: DC

## 2020-06-29 MED ORDER — FUROSEMIDE 40 MG PO TABS
40.0000 mg | ORAL_TABLET | Freq: Once | ORAL | Status: AC
  Administered 2020-06-29: 40 mg via ORAL
  Filled 2020-06-29: qty 1

## 2020-06-29 MED ORDER — DILTIAZEM HCL ER COATED BEADS 360 MG PO CP24: 360.0000 mg | ORAL_CAPSULE | Freq: Every day | ORAL | 3 refills | Status: DC

## 2020-06-29 MED ORDER — FUROSEMIDE 20 MG PO TABS: 20.0000 mg | ORAL_TABLET | ORAL | 11 refills | Status: DC

## 2020-06-29 MED ORDER — SENNOSIDES-DOCUSATE SODIUM 8.6-50 MG PO TABS: 2.0000 | ORAL_TABLET | Freq: Every day | ORAL | 5 refills | Status: DC

## 2020-06-29 MED ORDER — ISOSORBIDE MONONITRATE ER 30 MG PO TB24: 30.0000 mg | ORAL_TABLET | Freq: Every day | ORAL | 11 refills | Status: DC

## 2020-06-29 MED ORDER — ASPIRIN 81 MG PO CHEW: 81.0000 mg | CHEWABLE_TABLET | Freq: Every day | ORAL | 3 refills | Status: DC

## 2020-06-29 MED ORDER — LISINOPRIL 40 MG PO TABS: 40.0000 mg | ORAL_TABLET | Freq: Every day | ORAL | 5 refills | Status: DC

## 2020-06-29 MED ORDER — FUROSEMIDE 10 MG/ML IJ SOLN
40.0000 mg | Freq: Once | INTRAMUSCULAR | Status: AC
  Administered 2020-06-29: 40 mg via INTRAVENOUS
  Filled 2020-06-29: qty 4

## 2020-06-29 NOTE — TOC Progression Note (Addendum)
Pt here observation status from [facility]. Pt clear to dc back to [facility] today per MD. Theophilus Bones at [facility]. DC clinical will be sent electronically. Updated DIL, [name]. There are no other TOC needs for dc.

## 2020-06-29 NOTE — Care Management Obs Status (Signed)
[place] NOTIFICATION   Patient Details  Name: Jeffrey Sherman MRN: [id number] Date of Birth: 09-26-1929   Medicare Observation Status Notification Given:  Yes    Tommy Medal 06/29/2020, 3:41 PM

## 2020-06-29 NOTE — Discharge Summary (Signed)
Jeffrey Sherman, is a 87 y.o. male  DOB 02-06-1930  MRN [id number].  Admission date:  06/28/2020  Admitting Physician  [name], MD  Discharge Date:  06/29/2020   Primary MD  Gerlene Fee, NP  Recommendations for primary care physician for things to follow:   1)Very low-salt diet advised 2)Weigh yourself daily, call if you gain more than 3 pounds in 1 day or more than 5 pounds in 1 week as your diuretic medications may need to be adjusted 3)Limit your Fluid  intake to no more than 60 ounces (1.8 Liters) per day 4)Please take 2 tablets (40 mg) QAM starting 06/30/2020 through 07/04/2020 inclusive--and then 1 Tab (20 mg 5)Oxygen Via Nasal Canula at 2 L/min continuously-- 6)Please follow-up with general surgeon Dr. Constance Haw as scheduled for postop check 7)Please get outpatient Echocardiogram within 1 week due to CHF concerns--   Admission Diagnosis  Biliary dyskinesia [K82.8]   Discharge Diagnosis  Biliary dyskinesia [K82.8]    Principal Problem:   Biliary dyskinesia Active Problems:   Chronic constipation   Dyspnea   Essential hypertension   Gastroesophageal reflux disease without esophagitis   Prostate cancer ([place])   Overactive bladder   Dyslipidemia   Interstitial lung disease (HCC)   Protein-calorie malnutrition, severe ([place])   Dementia without behavioral disturbance (HCC)   Iron deficiency anemia due to dietary causes   Ischemic colitis ([place])   Aortic atherosclerosis ([place])      Past Medical History:  Diagnosis Date  . Anxiety   . Cancer [facility])    prostate  . Chronic chest wall pain   . Essential hypertension, benign 06/20/2016  . GAD (generalized anxiety disorder)   . GERD (gastroesophageal reflux disease)   . H/O echocardiogram 2016   normal  . Heart murmur    "leaking heart valve"  . High cholesterol 06/20/2016  . HOH (hard of hearing)    left  . Hx of falling   .  Hyperlipidemia   . Interstitial pulmonary disease ([place])   . MDD (major depressive disorder)   . Murmur, cardiac   . Poor historian   . PUD (peptic ulcer disease)   . RLS (restless legs syndrome)   . Shortness of breath   . Wrist fracture 12/16/2011    Past Surgical History:  Procedure Laterality Date  . CHOLECYSTECTOMY N/A 06/28/2020   Procedure: LAPAROSCOPIC CHOLECYSTECTOMY;  Surgeon: [name], MD;  Location: [facility] ORS;  Service: General;  Laterality: N/A;  pt in [facility]  . COLONOSCOPY  2005   Rehman: Sigmoid colon diverticulosis, moderate sized external hemorrhoids. A few focal areas of mucosal erythema felt to be nonspecific.  Marland Kitchen ESOPHAGOGASTRODUODENOSCOPY    . ESOPHAGOGASTRODUODENOSCOPY N/A 07/18/2016   Rehman: normal exam except duodenal scar. h.pylori serologies negative  . FLEXIBLE SIGMOIDOSCOPY N/A 06/11/2020   Procedure: FLEXIBLE SIGMOIDOSCOPY;  Surgeon: Eloise Harman, DO;  Location: [facility] ENDO SUITE;  Service: Endoscopy;  Laterality: N/A;  . HIP PINNING,CANNULATED Right 03/10/2020   Procedure: INTERNAL FIXATION RIGHT HIP;  Surgeon: Carole Civil, MD;  Location: [facility] ORS;  Service: Orthopedics;  Laterality: Right;  . ORIF WRIST FRACTURE  12/19/2011   Procedure: OPEN REDUCTION INTERNAL FIXATION (ORIF) WRIST FRACTURE;  Surgeon: Carole Civil, MD;  Location: [facility] ORS;  Service: Orthopedics;  Laterality: Right;  . prostate cancer     Diagnosed in the 90s  . TONSILLECTOMY       HPI  from the history and physical done on the day of admission:    Jeffrey Sherman is a 87 y.o. male.  HPI: [name] is a 87 yo who was admitted to the hospital recently with colitis and diarrhea but started to have upper abdominal pain and some reflux symptoms and nausea. When I saw him in the hospital he was tolerating a diet but says that he has really had a poor appetite since being in the hospital.  He says that he has continued to have diarrhea and that [facility] has not been really  giving him the imodium scheduled.   He had work up in the hospital with HIDA that demonstrated a non functioning gallbladder consistent with severe biliary dyskinesia. This was a little bit convoluted during the hospitalization due to his other acute issues so we decided to see how he felt over the next few days.   He also had workup that showed stones.   He is here today with his daughter in law. He had a hip surgery a few months back with a block and general anesthesia. He denies any new chest pain or shortness of breath. He does describe burning reflux symptoms but no chest pressure or heaviness on his chest.     Hospital Course:     Brief Summary:- 87 y.o. male with longstanding ILD on home oxygen, RLS, dementia, wheelchair bound for last 6 months, longterm resident of [facility], history of prostate cancer, HTN, PVD, recent admission for ischemic colitis and hematochezia was admitted for treatment of biliary colic by surgery team.  He had lap chole performed by Dr. Constance Haw 1/19 for biliary dyskinesia.  He is being admitted postoperatively for observation.  He is requiring 2L/min [place] oxygen  A/p   1)Postop Day #1 s/p lap chole - pt being admitted for observation postop.  He seems to have tolerated procedure well.  -Follow-up management per general surgery   2)biliary colic - treated with lap chole.,  Follow-up with Dr. Constance Haw as advised   3)ILD -interstitial lung disease--patient now with hypoxic respiratory failure most likely from CHF --- continue oxygen at 2 L/min continuously--bronchodilators as advised  4) CHF-- ??  Systolic Versus Diastolic---patient had volume overload/CHF findings.  Most likely due to perioperative iv  Fluids--responded well to IV Lasix -Discharged on p.o. Lasix, get echocardiogram as outpatient and consider cardiology referral as outpatient  5)HTN--resume PTA meds   6) Dementia - this has been stable.    7)RLS - resume home meds    8)History of GERD  and PUD - c/n  for GI protection.    9)Moderate protein calorie malnutrition -nutritional supplements as desired  Discharge Condition: stable  Follow UP   Follow-up Information    [name], MD On 07/13/2020.   Specialty: General Surgery Why: post op phone call to SNF; if you need to be seen in person call the office  Contact information: [address] [facility] [facility] [address] [phone]               Diet and Activity recommendation:  As advised  Discharge Instructions    Discharge Instructions    Call MD for:  difficulty breathing, headache or visual disturbances   Complete by: As directed    Call MD for:  persistant dizziness or light-headedness   Complete by: As directed    Call MD for:  persistant nausea and vomiting   Complete by: As directed    Call MD for:  severe uncontrolled pain   Complete by: As directed    Call MD for:  temperature >100.4   Complete by: As directed    Diet - low sodium heart healthy   Complete by: As directed    Discharge instructions   Complete by: As directed    1)Very low-salt diet advised 2)Weigh yourself daily, call if you gain more than 3 pounds in 1 day or more than 5 pounds in 1 week as your diuretic medications may need to be adjusted 3)Limit your Fluid  intake to no more than 60 ounces (1.8 Liters) per day 4)Please take 2 tablets (40 mg) QAM starting 06/30/2020 through 07/04/2020 inclusive--and then 1 Tab (20 mg 5)Oxygen Via Nasal Canula at 2 L/min continuously-- 6) please follow-up with general surgeon Dr. Constance Haw as scheduled for postop check   Discharge wound care:   Complete by: As directed    As per Dr Constance Haw   Increase activity slowly   Complete by: As directed        Discharge Medications     Allergies as of 06/29/2020   No Known Allergies     Medication List    STOP taking these medications   terazosin 5 MG capsule Commonly known as: HYTRIN     TAKE these medications   acetaminophen 500 MG  tablet Commonly known as: TYLENOL Take 1,000 mg by mouth in the morning, at noon, and at bedtime.   alfuzosin 10 MG 24 hr tablet Commonly known as: UROXATRAL Take 10 mg by mouth daily with breakfast.   aspirin 81 MG chewable tablet Chew 1 tablet (81 mg total) by mouth daily with breakfast. What changed: when to take this   calcium carbonate 500 MG chewable tablet Commonly known as: TUMS - dosed in mg elemental calcium Chew 1 tablet by mouth 2 (two) times daily as needed for indigestion or heartburn.   diltiazem 360 MG 24 hr capsule Commonly known as: CARDIZEM CD Take 1 capsule (360 mg total) by mouth daily. What changed: additional instructions   ferrous sulfate 325 (65 FE) MG tablet Take 325 mg by mouth daily with breakfast. For Anemia and  Iron Deficiency   Fish Oil-Flax Oil-Borage Oil Caps Take 1 capsule by mouth daily.   furosemide 20 MG tablet Commonly known as: Lasix Take 1 tablet (20 mg total) by mouth See admin instructions. Please take 2 tablets (40 mg) QAM starting 06/30/2020 through 07/04/2020 inclusive--and then 1 Tab (20 mg   hydrALAZINE 50 MG tablet Commonly known as: APRESOLINE Take 1 tablet (50 mg total) by mouth 3 (three) times daily.   ipratropium-albuterol 0.5-2.5 (3) MG/3ML Soln Commonly known as: DUONEB Take 3 mLs by nebulization every 6 (six) hours as needed.   isosorbide mononitrate 30 MG 24 hr tablet Commonly known as: IMDUR Take 1 tablet (30 mg total) by mouth daily.   lisinopril 40 MG tablet Commonly known as: ZESTRIL Take 1 tablet (40 mg total) by mouth daily.   loperamide 2 MG tablet Commonly known as: IMODIUM A-D amt: 2mg ; oral Special Instructions: after each stool do not exceed  more than 4tabs in 24 hours, As Needed   Melatonin 10 MG Tabs Take 10 mg by mouth at bedtime.   memantine 10 MG tablet Commonly known as: NAMENDA Take 10 mg by mouth 2 (two) times daily.   metoprolol tartrate 25 MG tablet Commonly known as:  LOPRESSOR Take 1 tablet (25 mg total) by mouth 2 (two) times daily.   NON FORMULARY Diet - Mechanical Soft _ Bland diet only. (EX: Bread, rice, toast, crackers, applesauce, mashed potatoes, BRAT diet, avoid fatty foods)   omeprazole 20 MG capsule Commonly known as: PRILOSEC Take 1 capsule (20 mg total) by mouth daily before breakfast.   ondansetron 8 MG disintegrating tablet Commonly known as: ZOFRAN-ODT Take 8 mg by mouth 3 (three) times daily before meals. For vomiting and nausea   polyethylene glycol 17 g packet Commonly known as: MIRALAX / GLYCOLAX Take 17 g by mouth daily as needed.   potassium chloride SA 20 MEQ tablet Commonly known as: KLOR-CON Take 20 mEq by mouth 2 (two) times daily.   PRO-STAT PO Take 60 mLs by mouth 3 (three) times daily with meals.   rOPINIRole 0.25 MG tablet Commonly known as: REQUIP Take 0.25 mg by mouth at bedtime.   salicylic acid 17 % gel Apply 1 application topically in the morning and at bedtime. Apply to warts to left hand   senna-docusate 8.6-50 MG tablet Commonly known as: Senokot-S Take 2 tablets by mouth at bedtime. What changed:   how much to take  when to take this  reasons to take this   simethicone 125 MG chewable tablet Commonly known as: MYLICON Chew 0000000 mg by mouth in the morning, at noon, in the evening, and at bedtime.   Venelex Oint Special Instructions: Apply to bilateral buttocks & sacral area qshift for blanchable erythema.   venlafaxine XR 150 MG 24 hr capsule Commonly known as: EFFEXOR-XR Take 150 mg by mouth daily with breakfast. Along with 75 mg to = 225 mg   venlafaxine XR 75 MG 24 hr capsule Commonly known as: EFFEXOR-XR Take 75 mg by mouth daily with breakfast. Take along with 150 mg to = 225 mg   vitamin C 1000 MG tablet Take 1,000 mg by mouth daily.            Discharge Care Instructions  (From admission, onward)         Start     Ordered   06/29/20 0000  Discharge wound care:        Comments: As per Dr Constance Haw   06/29/20 1417         Major procedures and Radiology Reports - PLEASE review detailed and final reports for all details, in brief -   CT ABDOMEN PELVIS WO CONTRAST  Result Date: 06/05/2020 CLINICAL DATA:  Diffuse abdominal pain and diarrhea EXAM: CT ABDOMEN AND PELVIS WITHOUT CONTRAST TECHNIQUE: Multidetector CT imaging of the abdomen and pelvis was performed following the standard protocol without IV contrast. COMPARISON:  05/25/2020 FINDINGS: Lower chest: Bibasilar atelectasis/scarring. Hepatobiliary: Stable small cyst of the medial left hepatic lobe. Cholelithiasis. No biliary dilatation. Pancreas: Unremarkable Spleen: Unremarkable. Adrenals/Urinary Tract: Adrenals are unremarkable. Bilateral renal cysts. 3 mm nonobstructing calculus of the right lower pole. Small bladder diverticulum on the right. Stomach/Bowel: Stomach is within normal limits. Small bowel is normal in caliber. There is mild distention of the colon with air-fluid levels and some wall thickening along the descending colon. Distal colonic diverticulosis. Vascular/Lymphatic: Aortic atherosclerosis. No enlarged lymph nodes. Reproductive:  Stable appearance of the prostate. Other: No ascites.  No acute abnormality of the abdominal wall. Musculoskeletal: Stable degenerative changes of the lumbar spine. Right hip fixation with associated streak artifact. IMPRESSION: Mild colonic distension with air-fluid levels and segmental wall thickening likely reflects colitis. Additional stable findings detailed above. Electronically Signed   By: Macy Mis M.D.   On: 06/05/2020 14:39   DG Chest 2 View  Result Date: 06/29/2020 CLINICAL DATA:  Shortness of breath EXAM: CHEST - 2 VIEW COMPARISON:  06/13/2020 FINDINGS: No significant change in chest radiographs with diffuse bilateral interstitial pulmonary opacity, small bilateral pleural effusions, and cardiomegaly. No new or focal airspace opacity. Disc degenerative  disease of the thoracic spine. IMPRESSION: No significant change in chest radiographs with diffuse bilateral interstitial pulmonary opacity, small bilateral pleural effusions, and cardiomegaly, findings consistent with edema. No new or focal airspace opacity. Electronically Signed   By: Eddie Candle M.D.   On: 06/29/2020 11:36   US Abdomen Complete  Result Date: 06/09/2020 CLINICAL DATA:  Right upper quadrant pain EXAM: ABDOMEN ULTRASOUND COMPLETE COMPARISON:  Ultrasound 03/12/2020, CT 06/09/2020 FINDINGS: Gallbladder: Gallbladder appears distended. Moderate sludge within the gallbladder lumen. Echodense foci in the sludge may reflect small stones. Slight increased wall thickness of 3.6 mm. Small amount of pericholecystic fluid. Negative sonographic Murphy. Gallbladder polyp up to 6 mm. Common bile duct: Diameter: 6 mm Liver: Within normal limits for echogenicity. No focal hepatic abnormality. Hepatic cyst on CT not well seen on sonography. Portal vein is patent on color Doppler imaging with normal direction of blood flow towards the liver. IVC: No abnormality visualized. Pancreas: Visualized portion unremarkable. Spleen: Size and appearance within normal limits. Right Kidney: Length: 11 cm. Borderline to mild renal cortical thickening. No hydronephrosis. Multiple cysts in the right kidney, the largest is seen in the lower pole and measures 5.3 cm. Left Kidney: Length: 12 cm. Echogenicity normal. No hydronephrosis. Dominant cyst in the upper pole measuring 6.6 cm. Abdominal aorta: No aneurysm visualized. Other findings: Incidental note made of bilateral pleural effusions. IMPRESSION: 1. Moderate gallbladder sludge with probable punctate stones. Slight increased wall thickness with small amount of fluid adjacent to the gallbladder but negative sonographic Murphy. Findings are suggestive of but not definitive for cholecystitis; consider correlation with nuclear medicine hepatobiliary imaging. 6 mm gallbladder  polyp. 2. Simple appearing renal cysts. 3. Incidental note made of pleural effusions. Electronically Signed   By: Donavan Foil M.D.   On: 06/09/2020 16:12   CT Abdomen Pelvis W Contrast  Result Date: 06/09/2020 CLINICAL DATA:  Abdominal distension EXAM: CT ABDOMEN AND PELVIS WITH CONTRAST TECHNIQUE: Multidetector CT imaging of the abdomen and pelvis was performed using the standard protocol following bolus administration of intravenous contrast. CONTRAST:  70mL OMNIPAQUE IOHEXOL 300 MG/ML  SOLN COMPARISON:  06/05/2020 FINDINGS: Lower chest: Small bilateral pleural effusions. Bibasilar atelectasis. Diffuse coronary artery calcifications. Heart is normal size. Hepatobiliary: Gallbladder is distended. Layering stones and possible sludge within the gallbladder. Slight indistinctness noted around the gallbladder concerning for cholecystitis. Mild periportal edema. No biliary ductal dilatation. Small cyst in the right hepatic lobe 16 mm. Pancreas: No focal abnormality or ductal dilatation. Spleen: No focal abnormality.  Normal size. Adrenals/Urinary Tract: Bilateral renal cysts. Slight fullness of the left renal collecting system, stable since prior study. No stones or overt hydronephrosis. Urinary bladder unremarkable. Stomach/Bowel: Sigmoid and descending colonic diverticulosis. There is wall thickening noted from the distal transverse colon through the mid to distal descending colon concerning for colitis. Slight  surrounding inflammation. Remainder of the colon, stomach and small bowel unremarkable. Vascular/Lymphatic: Heavily calcified aorta and iliac vessels. No aneurysm or adenopathy. Reproductive: Mildly prominent prostate. Other: No free fluid or free air. Musculoskeletal: No acute bony abnormality. IMPRESSION: Cholelithiasis. Haziness noted around the gallbladder. Cannot exclude acute cholecystitis. Continued wall thickening in the left colon from the distal transverse colon through the distal descending  colon concerning for colitis. Left colonic diverticulosis. Small bilateral pleural effusions, increasing since prior study. Bibasilar atelectasis. Coronary artery disease, aortic atherosclerosis. Electronically Signed   By: Rolm Baptise M.D.   On: 06/09/2020 13:39   NM Hepato W/EF  Result Date: 06/12/2020 CLINICAL DATA:  Cholelithiasis.  Evaluate for biliary dyskinesia. EXAM: NUCLEAR MEDICINE HEPATOBILIARY IMAGING WITH GALLBLADDER EF TECHNIQUE: Sequential images of the abdomen were obtained out to 60 minutes following intravenous administration of radiopharmaceutical. After slow intravenous infusion of 1.7 micrograms Cholecystokinin, gallbladder ejection fraction was determined. RADIOPHARMACEUTICALS:  5.1 mCi Tc-39m Choletec IV COMPARISON:  Right upper quadrant abdominal ultrasound-06/09/2020; CT abdomen pelvis-06/09/2020 FINDINGS: There is homogeneous distribution of injected radiotracer throughout the hepatic parenchyma. There is early opacification of the gallbladder, initially seen on the 15 minutes anterior projection planar image There is early excretion of radiotracer with opacification of the proximal small bowel, initially seen on the 20 minutes anterior projection planar image. Following the administration of CCK, there is little to no gallbladder emptying. Additionally, the patient reported mild abdominal pain during the CCK infusion. IMPRESSION: 1. No scintigraphic evidence of acute cholecystitis. 2. Findings compatible with severe biliary dyskinesia with little to no gallbladder emptying and elicitation of abdominal pain with the CCK administration. Electronically Signed   By: Sandi Mariscal M.D.   On: 06/12/2020 11:52   DG Chest Port 1 View  Result Date: 06/13/2020 CLINICAL DATA:  Hypoxemia and shortness of breath. Negative COVID test EXAM: PORTABLE CHEST 1 VIEW COMPARISON:  05/22/2020 FINDINGS: Diffuse interstitial opacity with Awanda Mink lines with possible trace pleural fluid. Stable heart size and  aortic contours. Negative for air leak. IMPRESSION: CHF pattern. Electronically Signed   By: Monte Fantasia M.D.   On: 06/13/2020 10:39   DG Abd Acute W/Chest  Result Date: 06/09/2020 CLINICAL DATA:  Abdominal distension. EXAM: DG ABDOMEN ACUTE WITH 1 VIEW CHEST COMPARISON:  None. FINDINGS: No small bowel dilatation is noted. Dilated and air-filled colon is noted suggesting possible ileus. No radiopaque calculi or other significant radiographic abnormality is seen. Heart size and mediastinal contours are within normal limits. Both lungs are clear. IMPRESSION: Dilated and air-filled colon is noted suggesting possible ileus. No acute cardiopulmonary disease. Electronically Signed   By: Marijo Conception M.D.   On: 06/09/2020 12:12    Micro Results   Recent Results (from the past 240 hour(s))  SARS Coronavirus 2 by RT PCR (hospital order, performed in [facility] hospital lab) Nasopharyngeal Nasopharyngeal Swab     Status: None   Collection Time: 06/28/20  7:46 AM   Specimen: Nasopharyngeal Swab  Result Value Ref Range Status   SARS Coronavirus 2 NEGATIVE NEGATIVE Final    Comment: (NOTE) SARS-CoV-2 target nucleic acids are NOT DETECTED.  The SARS-CoV-2 RNA is generally detectable in upper and lower respiratory specimens during the acute phase of infection. The lowest concentration of SARS-CoV-2 viral copies this assay can detect is 250 copies / mL. A negative result does not preclude SARS-CoV-2 infection and should not be used as the sole basis for treatment or other patient management decisions.  A negative result may occur  with improper specimen collection / handling, submission of specimen other than nasopharyngeal swab, presence of viral mutation(s) within the areas targeted by this assay, and inadequate number of viral copies (<250 copies / mL). A negative result must be combined with clinical observations, patient history, and epidemiological information.  Fact Sheet for Patients:    StrictlyIdeas.no  Fact Sheet for Healthcare Providers: BankingDealers.co.za  This test is not yet approved or  cleared by the [place] FDA and has been authorized for detection and/or diagnosis of SARS-CoV-2 by FDA under an Emergency Use Authorization (EUA).  This EUA will remain in effect (meaning this test can be used) for the duration of the COVID-19 declaration under Section 564(b)(1) of the Act, 21 U.S.C. section 360bbb-3(b)(1), unless the authorization is terminated or revoked sooner.  Performed at [facility], [address] [place], [place] [address]    Today   Subjective   Kacper Cartlidge today has no new complaints  Denies of breath improved, hypoxia has not resolved          Patient has been seen and examined prior to discharge   Objective   Blood pressure (!) 174/98, pulse 99, temperature 98.4 F (36.9 C), temperature source Oral, resp. rate 20, height 5' 9.02" (1.753 m), weight 91.7 kg, SpO2 96 %.   Intake/Output Summary (Last 24 hours) at 06/29/2020 1437 Last data filed at 06/29/2020 0109 Gross per 24 hour  Intake --  Output 240 ml  Net -240 ml   Exam Gen:- Awake Alert, no acute distress  HEENT:- [place].AT, No sclera icterus Nose- [place] 2L/min Neck-Supple Neck , No JVD,.  Lungs-diminished in bases, faint rales CV- S1, S2 normal, regular Abd-  +ve B.Sounds, Abd Soft, appropriate postop tenderness Extremity/Skin:-   good pulses Psych-affect is appropriate, oriented x3 Neuro-generalized weakness, no new focal deficits, no tremors    Data Review   CBC w Diff:  Lab Results  Component Value Date   WBC 10.1 06/29/2020   HGB 10.7 (L) 06/29/2020   HCT 36.1 (L) 06/29/2020   PLT 294 06/29/2020   LYMPHOPCT 8 06/09/2020   BANDSPCT 11 06/07/2020   MONOPCT 8 06/09/2020   EOSPCT 0 06/09/2020   BASOPCT 0 06/09/2020    CMP:  Lab Results  Component Value Date   NA 138 06/29/2020   K 4.4 06/29/2020   CL 101  06/29/2020   CO2 30 06/29/2020   BUN 26 (H) 06/29/2020   CREATININE 1.02 06/29/2020   CREATININE 1.18 (H) 08/25/2019   PROT 5.3 (L) 06/13/2020   ALBUMIN 2.1 (L) 06/13/2020   BILITOT 0.3 06/13/2020   ALKPHOS 78 06/13/2020   AST 12 (L) 06/13/2020   ALT 11 06/13/2020  . Total Discharge time is about 33 minutes  Roxan Hockey M.D on 06/29/2020 at 2:37 PM  Go to [email or web address] -  for contact info  [facility] - Office  [phone]

## 2020-06-29 NOTE — Progress Notes (Signed)
Discharge instructions given to nurse Chantel at [facility]. All questions answered. All belongings sent with patient. Patient will eat dinner before leaving.

## 2020-06-29 NOTE — Plan of Care (Signed)

## 2020-06-29 NOTE — Discharge Instructions (Signed)
- 1)Very low-salt diet advised 2)Weigh yourself daily, call if you gain more than 3 pounds in 1 day or more than 5 pounds in 1 week as your diuretic medications may need to be adjusted 3)Limit your Fluid  intake to no more than 60 ounces (1.8 Liters) per day 4)Please take 2 tablets (40 mg) QAM starting 06/30/2020 through 07/04/2020 inclusive--and then 1 Tab (20 mg 5)Oxygen Via Nasal Canula at 2 L/min continuously-- 6) please follow-up with general surgeon Dr. Constance Haw as scheduled for postop check 7)Please get outpatient Echocardiogram within 1 week due to CHF concerns--  Discharge Laparoscopic Surgery Instructions:  Common Complaints: Right shoulder pain is common after laparoscopic surgery. This is secondary to the gas used in the surgery being trapped under the diaphragm.  Walk to help your body absorb the gas. This will improve in a few days. Pain at the port sites are common, especially the larger port sites. This will improve with time.  Some nausea is common and poor appetite. The main goal is to stay hydrated the first few days after surgery.   Diet/ Activity: Diet as tolerated. You may not have an appetite, but it is important to stay hydrated. Drink 64 ounces of water a day. Your appetite will return with time.  Shower per your regular routine daily.  Do not take hot showers. Take warm showers that are less than 10 minutes. Rest and listen to your body, but do not remain in bed all day.  Walk everyday for at least 15-20 minutes. Deep cough and move around every 1-2 hours in the first few days after surgery.  Do not lift > 10 lbs, perform excessive bending, pushing, pulling, squatting for 1-2 weeks after surgery.  Do not pick at the dermabond glue on your incision sites.  This glue film will remain in place for 1-2 weeks and will start to peel off.  Do not place lotions or balms on your incision unless instructed to specifically by Dr. Constance Haw.   Pain Expectations and  Narcotics: -After surgery you will have pain associated with your incisions and this is normal. The pain is muscular and nerve pain, and will get better with time. -You are encouraged and expected to take non narcotic medications like tylenol and ibuprofen (when able) to treat pain as multiple modalities can aid with pain treatment. -Narcotics are only used when pain is severe or there is breakthrough pain. -You are not expected to have a pain score of 0 after surgery, as we cannot prevent pain. A pain score of 3-4 that allows you to be functional, move, walk, and tolerate some activity is the goal. The pain will continue to improve over the days after surgery and is dependent on your surgery. -Due to [place] law, we are only able to give a certain amount of pain medication to treat post operative pain, and we only give additional narcotics on a patient by patient basis.  -For most laparoscopic surgery, studies have shown that the majority of patients only need 10-15 narcotic pills, and for open surgeries most patients only need 15-20.   -Having appropriate expectations of pain and knowledge of pain management with non narcotics is important as we do not want anyone to become addicted to narcotic pain medication.  -Using ice packs in the first 48 hours and heating pads after 48 hours, wearing an abdominal binder (when recommended), and using over the counter medications are all ways to help with pain management.   -Simple acts like  meditation and mindfulness practices after surgery can also help with pain control and research has proven the benefit of these practices.  Medication: Take tylenol and ibuprofen as needed for pain control, alternating every 4-6 hours.  Example:  Tylenol 1000mg  @ 6am, 12noon, 6pm, 25midnight (Do not exceed 4000mg  of tylenol a day). Ibuprofen 800mg  @ 9am, 3pm, 9pm, 3am (Do not exceed 3600mg  of ibuprofen a day).  Take Roxicodone for breakthrough pain every 4 hours.  Take Colace  for constipation related to narcotic pain medication. If you do not have a bowel movement in 2 days, take Miralax over the counter.  Drink plenty of water to also prevent constipation.   Contact Information: If you have questions or concerns, please call our office, [phone], Monday- Thursday 8AM-5PM and Friday 8AM-12Noon.  If it is after hours or on the weekend, please call [facility] Main Number, [phone] ask to speak to the surgeon on call for Dr. Constance Haw at [facility].     Minimally Invasive Cholecystectomy, Care After This sheet gives you information about how to care for yourself after your procedure. Your doctor may also give you more specific instructions. If you have problems or questions, contact your doctor. What can I expect after the procedure? After the procedure, it is common:  To have pain at the areas of surgery. You will be given medicines for pain.  To vomit or feel like you may vomit.  To feel fullness in the belly (bloating) or to have pain in the shoulder. This comes from the gas that was used during the surgery. Follow these instructions at home: Medicines  Take over-the-counter and prescription medicines only as told by your doctor.  If you were prescribed an antibiotic medicine, take it as told by your doctor. Do not stop using the antibiotic even if you start to feel better.  Ask your doctor if the medicine prescribed to you: ? Requires you to avoid driving or using machinery. ? Can cause trouble pooping (constipation). You may need to take these actions to prevent or treat trouble pooping:  Drink enough fluid to keep your pee (urine) pale yellow.  Take over-the-counter or prescription medicines.  Eat foods that are high in fiber. These include beans, whole grains, and fresh fruits and vegetables.  Limit foods that are high in fat and sugar. These include fried or sweet foods. Incision care  Follow instructions from your doctor about how to take  care of your cuts from surgery (incisions). Make sure you: ? Wash your hands with soap and water for at least 20 seconds before and after you change your bandage (dressing). If you cannot use soap and water, use hand sanitizer. ? Change your bandage as told by your doctor. ? Leave stitches (sutures), skin glue, or skin tape (adhesive) strips in place. They may need to stay in place for 2 weeks or longer. If tape strips get loose and curl up, you may trim the loose edges. Do not remove tape strips completely unless your doctor says it is okay.  Do not take baths, swim, or use a hot tub until your doctor approves.   You may shower.  Check your surgery area every day for signs of infection. Check for: ? More redness, swelling, or pain. ? Fluid or blood. ? Warmth. ? Pus or a bad smell.   Activity  Rest as told by your doctor.  Do not sit for a long time without moving. Get up to take short walks every 1-2 hours.  This is important. Ask for help if you feel weak or unsteady.  Do not lift anything that is heavier than 10 lb (4.5 kg), or the limit that you are told, until your doctor says that it is safe.  Do not play contact sports until your doctor says it is okay.  Do not return to work or school until your doctor says it is okay.  Return to your normal activities as told by your doctor. Ask your doctor what activities are safe for you. General instructions  If you were given a medicine to help you relax (sedative) during your procedure, it can affect you for many hours. Do not drive or use machinery until your doctor says that it is safe.  Keep all follow-up visits as told by your doctor. This is important. Contact a doctor if:  You get a rash.  You have more redness, swelling, or pain around your cuts from surgery.  You have fluid or blood coming from your cuts from surgery.  Your cuts from surgery feel warm to the touch.  You have pus or a bad smell coming from your cuts from  surgery.  You have a fever.  One or more of your cuts from surgery breaks open. Get help right away if:  You have trouble breathing.  You have chest pain.  You have pain that is getting worse in your shoulders.  You faint or feel dizzy when you stand.  You have very bad pain in your belly (abdomen).  You feel like you may vomit or you vomit, and this lasts for more than one day.  You have leg pain. Summary  After your surgery, it is common to have pain at the areas of surgery. You may also have vomiting or fullness in the belly.  Follow your doctor's instructions about medicine, activity restrictions, and caring for your surgery areas. Do not do activities that require a lot of effort.  Contact a doctor if you have a fever or other signs of infection, such as more redness, swelling, or pain around the cuts from surgery.  Get help right away if you have chest pain, increasing pain in the shoulders, or trouble breathing. This information is not intended to replace advice given to you by your health care provider. Make sure you discuss any questions you have with your health care provider. Document Revised: 05/11/2019 Document Reviewed: 05/11/2019 Elsevier Patient Education  2021 [place].  - 1)Very low-salt diet advised 2)Weigh yourself daily, call if you gain more than 3 pounds in 1 day or more than 5 pounds in 1 week as your diuretic medications may need to be adjusted 3)Limit your Fluid  intake to no more than 60 ounces (1.8 Liters) per day 4)Please take 2 tablets (40 mg) QAM starting 06/30/2020 through 07/04/2020 inclusive--and then 1 Tab (20 mg 5)Oxygen Via Nasal Canula at 2 L/min continuously-- 6) please follow-up with general surgeon Dr. Constance Haw as scheduled for postop check

## 2020-06-29 NOTE — Progress Notes (Signed)
Pt received dinner tray. Ate 100%

## 2020-06-29 NOTE — Progress Notes (Signed)
[facility] Progress Note  1 Day Post-Op  Subjective: No major complaints. Says he is pulling IS 1200 but RN reports only 500. He is eating and says his abdominal pain is much better and not needing much pain medication. He received roxicodone once last night.  Desaturation with removing O2. CXR with some edema. Dr. Denton Brick has given some lasix.   Objective: Vital signs in last 24 hours: Temp:  [98.2 F (36.8 C)-99 F (37.2 C)] 98.4 F (36.9 C) (01/20 0542) Pulse Rate:  [76-99] 99 (01/20 0542) Resp:  [20-25] 20 (01/20 0542) BP: (153-174)/(72-98) 174/98 (01/20 0542) SpO2:  [82 %-100 %] 96 % (01/20 0542) Weight:  [91.7 kg] 91.7 kg (01/19 1603)    Intake/Output from previous day: 01/19 0701 - 01/20 0700 In: 1100 [I.V.:1000; IV Piggyback:100] Out: 245 [Urine:240; Blood:5] Intake/Output this shift: No intake/output data recorded.  General appearance: alert, cooperative and no distress Resp: normal work of breathing but on 02, desat with removing GI: soft, mildly distended, nontender, port sites with dermabond  Lab Results:  Recent Labs    06/28/20 1901 06/29/20 0558  WBC 7.1 10.1  HGB 10.1* 10.7*  HCT 34.7* 36.1*  PLT 270 294   BMET Recent Labs    06/28/20 1901 06/29/20 0558  NA 140 138  K 4.7 4.4  CL 101 101  CO2 30 30  GLUCOSE 201* 152*  BUN 27* 26*  CREATININE 1.02 1.02  CALCIUM 8.7* 8.8*   PT/INR No results for input(s): LABPROT, INR in the last 72 hours.  Studies/Results: DG Chest 2 View  Result Date: 06/29/2020 CLINICAL DATA:  Shortness of breath EXAM: CHEST - 2 VIEW COMPARISON:  06/13/2020 FINDINGS: No significant change in chest radiographs with diffuse bilateral interstitial pulmonary opacity, small bilateral pleural effusions, and cardiomegaly. No new or focal airspace opacity. Disc degenerative disease of the thoracic spine. IMPRESSION: No significant change in chest radiographs with diffuse bilateral interstitial pulmonary opacity,  small bilateral pleural effusions, and cardiomegaly, findings consistent with edema. No new or focal airspace opacity. Electronically Signed   By: Eddie Candle M.D.   On: 06/29/2020 11:36    Anti-infectives: Anti-infectives (From admission, onward)   Start     Dose/Rate Route Frequency Ordered Stop   06/28/20 0945  cefoTEtan (CEFOTAN) 2 g in sodium chloride 0.9 % 100 mL IVPB        2 g 200 mL/hr over 30 Minutes Intravenous On call to O.R. 06/28/20 0942 06/28/20 1149      Assessment/Plan: Mr. Kina is a 87 yo s/p laparoscopic cholecystectomy for biliary dyskinesia. Doing well with diet and abdominal pain but unable to get off O2. Has interstitial lung disease and some edema likely from fluids etc.  PRN for pain IS, OOB Dr. Denton Brick helping with O2 and diuresis Dr. Denton Brick will get him back to [facility] later today Updated Silva Bandy the daughter in law Post op phone check in 2 weeks    LOS: 0 days    [name] 06/29/2020

## 2020-06-29 NOTE — NC FL2 (Signed)
[place] MEDICAID FL2 LEVEL OF CARE SCREENING TOOL     IDENTIFICATION  Patient Name: Jeffrey Sherman Birthdate: 09/14/1929 Sex: male Admission Date (Current Location): 06/28/2020  [facility] and [place] Number:  [employer] and Address:  [place] [address], [place]      Provider Number: [id number]  Attending Physician Name and Address:  [name], MD  Relative Name and Phone Number:       Current Level of Care: Hospital Recommended Level of Care: [place] Prior Approval Number:    Date Approved/Denied:   PASRR Number:    Discharge Plan:      Current Diagnoses: Patient Active Problem List   Diagnosis Date Noted  . Chest pain 06/16/2020  . Aortic atherosclerosis ([place]) 06/15/2020  . Calculus of gallbladder without cholecystitis without obstruction   . Biliary dyskinesia   . Hyperkalemia 06/09/2020  . Acute renal failure (ARF) ([place]) 06/08/2020  . Right hip pain   . Lower GI bleed   . Ischemic colitis ([place]) 05/24/2020  . Sepsis ([place]) 05/22/2020  . Restless leg syndrome 04/21/2020  . Protein-calorie malnutrition, severe ([place]) 03/22/2020  . Dementia without behavioral disturbance ([place]) 03/22/2020  . Iron deficiency anemia due to dietary causes 03/22/2020  . Prostate cancer ([place]) 03/15/2020  . Overactive bladder 03/15/2020  . Dyslipidemia 03/15/2020  . Major depression, recurrent, chronic ([place]) 03/15/2020  . Interstitial lung disease ([name]) 03/15/2020  . Closed displaced fracture of right femoral neck ([place]) 03/10/20 cannulated screw fixation  03/09/2020  . Acute blood loss as cause of postoperative anemia   . Rectal bleeding 11/06/2017  . Hypokalemia 11/06/2017  . Renal insufficiency 11/06/2017  . Essential hypertension 06/20/2016  . High cholesterol 06/20/2016  . Gastroesophageal reflux disease without esophagitis 06/20/2016  . Dyspnea 12/17/2013  . Chronic constipation 12/16/2011    Orientation  RESPIRATION BLADDER Height & Weight     Self,Situation,Place  O2 (see dc summary) Incontinent Weight: 202 lb 3.2 oz (91.7 kg) Height:  5' 9.02" (175.3 cm)  BEHAVIORAL SYMPTOMS/MOOD NEUROLOGICAL BOWEL NUTRITION STATUS      Continent Diet (see dc summary)  AMBULATORY STATUS COMMUNICATION OF NEEDS Skin   Extensive Assist Verbally Surgical wounds                       Personal Care Assistance Level of Assistance  Bathing,Feeding,Dressing Bathing Assistance: Maximum assistance Feeding assistance: Limited assistance Dressing Assistance: Maximum assistance     Functional Limitations Info  Sight,Hearing,Speech Sight Info: Adequate Hearing Info: Impaired Speech Info: Adequate    SPECIAL CARE FACTORS FREQUENCY                       Contractures Contractures Info: Not present    Additional Factors Info  Code Status,Allergies Code Status Info: DNR Allergies Info: NKA           Current Medications (06/29/2020):  This is the current hospital active medication list Current Facility-Administered Medications  Medication Dose Route Frequency Provider Last Rate Last Admin  . (feeding supplement) PROSource Plus liquid 60 mL  60 mL Oral TID WC Johnson, Clanford L, MD      . acetaminophen (TYLENOL) tablet 1,000 mg  1,000 mg Oral Q8H Johnson, Clanford L, MD   1,000 mg at 06/29/20 0616  . alfuzosin (UROXATRAL) 24 hr tablet 10 mg  10 mg Oral Q breakfast Johnson, Clanford L, MD   10 mg at 06/29/20 1013  .  alum & mag hydroxide-simeth (MAALOX/MYLANTA) 200-200-20 MG/5ML suspension 30 mL  30 mL Oral Q4H PRN [name], MD   30 mL at 06/28/20 2307  . ascorbic acid (VITAMIN C) tablet 1,000 mg  1,000 mg Oral Daily Johnson, Clanford L, MD   1,000 mg at 06/29/20 1013  . diltiazem (CARDIZEM CD) 24 hr capsule 360 mg  360 mg Oral Daily Johnson, Clanford L, MD   360 mg at 06/29/20 1012  . enoxaparin (LOVENOX) injection 40 mg  40 mg Subcutaneous Q24H Johnson, Clanford L, MD      . ferrous  sulfate tablet 325 mg  325 mg Oral Q breakfast Johnson, Clanford L, MD   325 mg at 06/29/20 1013  . ipratropium-albuterol (DUONEB) 0.5-2.5 (3) MG/3ML nebulizer solution 3 mL  3 mL Nebulization Q6H PRN Johnson, Clanford L, MD      . memantine (NAMENDA) tablet 10 mg  10 mg Oral BID Johnson, Clanford L, MD   10 mg at 06/29/20 1012  . metoprolol tartrate (LOPRESSOR) injection 5 mg  5 mg Intravenous Q6H PRN Johnson, Clanford L, MD      . metoprolol tartrate (LOPRESSOR) tablet 25 mg  25 mg Oral BID WC Johnson, Clanford L, MD   25 mg at 06/29/20 1011  . morphine 2 MG/ML injection 2 mg  2 mg Intravenous Q4H PRN [name], MD      . ondansetron [facility]) tablet 4 mg  4 mg Oral Q6H PRN Johnson, Clanford L, MD       Or  . ondansetron (ZOFRAN) injection 4 mg  4 mg Intravenous Q6H PRN Johnson, Clanford L, MD      . ondansetron (ZOFRAN-ODT) disintegrating tablet 8 mg  8 mg Oral TID AC Johnson, Clanford L, MD   8 mg at 06/29/20 1012  . oxyCODONE (Oxy IR/ROXICODONE) immediate release tablet 5 mg  5 mg Oral Q4H PRN [name], MD   5 mg at 06/28/20 2115  . pantoprazole (PROTONIX) EC tablet 40 mg  40 mg Oral Daily Johnson, Clanford L, MD   40 mg at 06/29/20 1013  . polyethylene glycol (MIRALAX / GLYCOLAX) packet 17 g  17 g Oral Daily PRN Johnson, Clanford L, MD      . potassium chloride SA (KLOR-CON) CR tablet 20 mEq  20 mEq Oral BID Johnson, Clanford L, MD   20 mEq at 06/29/20 1015  . rOPINIRole (REQUIP) tablet 0.25 mg  0.25 mg Oral QHS Johnson, Clanford L, MD   0.25 mg at 06/28/20 2215  . senna-docusate (Senokot-S) tablet 1 tablet  1 tablet Oral QHS PRN Johnson, Clanford L, MD      . terazosin (HYTRIN) capsule 5 mg  5 mg Oral QHS Johnson, Clanford L, MD   5 mg at 06/28/20 2215  . venlafaxine XR (EFFEXOR-XR) 24 hr capsule 225 mg  225 mg Oral Q breakfast [name], MD   225 mg at 06/29/20 1012     Discharge Medications: Please see discharge summary for a list of discharge  medications.  Relevant Imaging Results:  Relevant Lab Results:   Additional Information    Shade Flood, LCSW

## 2020-06-30 ENCOUNTER — Non-Acute Institutional Stay (SKILLED_NURSING_FACILITY): Payer: Medicare Other | Admitting: Adult Health

## 2020-06-30 ENCOUNTER — Encounter: Payer: Self-pay | Admitting: Adult Health

## 2020-06-30 DIAGNOSIS — J849 Interstitial pulmonary disease, unspecified: Secondary | ICD-10-CM | POA: Diagnosis not present

## 2020-06-30 DIAGNOSIS — F0390 Unspecified dementia without behavioral disturbance: Secondary | ICD-10-CM | POA: Diagnosis not present

## 2020-06-30 DIAGNOSIS — D508 Other iron deficiency anemias: Secondary | ICD-10-CM

## 2020-06-30 DIAGNOSIS — K219 Gastro-esophageal reflux disease without esophagitis: Secondary | ICD-10-CM

## 2020-06-30 DIAGNOSIS — K828 Other specified diseases of gallbladder: Secondary | ICD-10-CM | POA: Diagnosis not present

## 2020-06-30 DIAGNOSIS — F339 Major depressive disorder, recurrent, unspecified: Secondary | ICD-10-CM

## 2020-06-30 DIAGNOSIS — S72001A Fracture of unspecified part of neck of right femur, initial encounter for closed fracture: Secondary | ICD-10-CM | POA: Diagnosis not present

## 2020-06-30 DIAGNOSIS — I10 Essential (primary) hypertension: Secondary | ICD-10-CM

## 2020-06-30 DIAGNOSIS — C61 Malignant neoplasm of prostate: Secondary | ICD-10-CM | POA: Diagnosis not present

## 2020-06-30 DIAGNOSIS — I700 Atherosclerosis of aorta: Secondary | ICD-10-CM | POA: Diagnosis not present

## 2020-06-30 LAB — SURGICAL PATHOLOGY

## 2020-06-30 NOTE — Progress Notes (Signed)
Location:  [facility] Room Number: 156/D Place of Service:  SNF (31)   CODE STATUS: DNR  No Known Allergies  Chief Complaint  Patient presents with  . Hospitalization Follow-up    Observation Follow Up    HPI:  He is a 87 year old man who was admitted to observation after a lap chole. He had biliary colic. Post op he developed fluid overload versus chf; more than likely related to his ivf. He tells me that he is feeling much better. He denies any abdominal pain; no nausea or vomiting; no diarrhea. He will continue to be followed for his chronic illnesses including: Gastroesophageal reflux disease without esophagitis: Prostate cancer/overactive bladder   Past Medical History:  Diagnosis Date  . Anxiety   . Cancer [facility])    prostate  . Chronic chest wall pain   . Essential hypertension, benign 06/20/2016  . GAD (generalized anxiety disorder)   . GERD (gastroesophageal reflux disease)   . H/O echocardiogram 2016   normal  . Heart murmur    "leaking heart valve"  . High cholesterol 06/20/2016  . HOH (hard of hearing)    left  . Hx of falling   . Hyperlipidemia   . Interstitial pulmonary disease ([place])   . MDD (major depressive disorder)   . Murmur, cardiac   . Poor historian   . PUD (peptic ulcer disease)   . RLS (restless legs syndrome)   . Shortness of breath   . Wrist fracture 12/16/2011    Past Surgical History:  Procedure Laterality Date  . CHOLECYSTECTOMY N/A 06/28/2020   Procedure: LAPAROSCOPIC CHOLECYSTECTOMY;  Surgeon: [name], MD;  Location: [facility] ORS;  Service: General;  Laterality: N/A;  pt in [facility]  . COLONOSCOPY  2005   [facility]: Sigmoid colon diverticulosis, moderate sized external hemorrhoids. A few focal areas of mucosal erythema felt to be nonspecific.  Marland Kitchen ESOPHAGOGASTRODUODENOSCOPY    . ESOPHAGOGASTRODUODENOSCOPY N/A 07/18/2016   [facility]: normal exam except duodenal scar. h.pylori serologies negative  . FLEXIBLE  SIGMOIDOSCOPY N/A 06/11/2020   Procedure: FLEXIBLE SIGMOIDOSCOPY;  Surgeon: Eloise Harman, DO;  Location: [facility] ENDO SUITE;  Service: Endoscopy;  Laterality: N/A;  . HIP PINNING,CANNULATED Right 03/10/2020   Procedure: INTERNAL FIXATION RIGHT HIP;  Surgeon: Carole Civil, MD;  Location: [facility] ORS;  Service: Orthopedics;  Laterality: Right;  . ORIF WRIST FRACTURE  12/19/2011   Procedure: OPEN REDUCTION INTERNAL FIXATION (ORIF) WRIST FRACTURE;  Surgeon: Carole Civil, MD;  Location: [facility] ORS;  Service: Orthopedics;  Laterality: Right;  . prostate cancer     Diagnosed in the 90s  . TONSILLECTOMY      Social History   Socioeconomic History  . Marital status: Widowed    Spouse name: Not on file  . Number of children: Not on file  . Years of education: Not on file  . Highest education level: Not on file  Occupational History  . Occupation: retired    Fish farm manager: RETIRED  Tobacco Use  . Smoking status: Never Smoker  . Smokeless tobacco: Never Used  Vaping Use  . Vaping Use: Never used  Substance and Sexual Activity  . Alcohol use: No  . Drug use: No  . Sexual activity: Not on file  Other Topics Concern  . Not on file  Social History Narrative  . Not on file   Social Determinants of Health   Financial Resource Strain: Not on file  Food Insecurity: Not on file  Transportation  Needs: Not on file  Physical Activity: Not on file  Stress: Not on file  Social Connections: Not on file  Intimate Partner Violence: Not on file   Family History  Problem Relation Age of Onset  . Cancer Other   . Heart attack Mother   . Cancer Brother   . Heart attack Brother   . Heart disease Sister        by pass      VITAL SIGNS BP (!) 151/52   Pulse 68   Temp 98.1 F (36.7 C)   Resp 20   Ht 5\' 9"  (1.753 m)   Wt 202 lb 3.2 oz (91.7 kg)   SpO2 96%   BMI 29.86 kg/m   Outpatient Encounter Medications as of 06/30/2020  Medication Sig  . acetaminophen (TYLENOL) 500 MG tablet Take  1,000 mg by mouth in the morning, at noon, and at bedtime.  Marland Kitchen alfuzosin (UROXATRAL) 10 MG 24 hr tablet Take 10 mg by mouth daily with breakfast.  . Amino Acids-Protein Hydrolys (PRO-STAT PO) Take 60 mLs by mouth 3 (three) times daily with meals.  . Ascorbic Acid (VITAMIN C) 1000 MG tablet Take 1,000 mg by mouth daily.  Marland Kitchen aspirin 81 MG chewable tablet Chew 1 tablet (81 mg total) by mouth daily with breakfast.  . Balsam [place]-Castor Oil (VENELEX) OINT Special Instructions: Apply to bilateral buttocks & sacral area qshift for blanchable erythema.  . calcium carbonate (TUMS - DOSED IN MG ELEMENTAL CALCIUM) 500 MG chewable tablet Chew 1 tablet by mouth 2 (two) times daily as needed for indigestion or heartburn.   . diltiazem (CARDIZEM CD) 360 MG 24 hr capsule Take 1 capsule (360 mg total) by mouth daily.  . ferrous sulfate 325 (65 FE) MG tablet Take 325 mg by mouth daily with breakfast. For Anemia and  Iron Deficiency  . Flax Oil-Fish Oil-Borage Oil (FISH OIL-FLAX OIL-BORAGE OIL) CAPS Take 1 capsule by mouth daily.  . furosemide (LASIX) 20 MG tablet Take 40 mg by mouth once daily from 06/30/2020-07/04/2020. Then take 20 mg by mouth once daily starting 07/05/2020  . hydrALAZINE (APRESOLINE) 50 MG tablet Take 1 tablet (50 mg total) by mouth 3 (three) times daily.  Marland Kitchen ipratropium-albuterol (DUONEB) 0.5-2.5 (3) MG/3ML SOLN Take 3 mLs by nebulization every 6 (six) hours as needed.  . isosorbide mononitrate (IMDUR) 30 MG 24 hr tablet Take 1 tablet (30 mg total) by mouth daily.  Marland Kitchen lisinopril (ZESTRIL) 40 MG tablet Take 1 tablet (40 mg total) by mouth daily.  Marland Kitchen loperamide (IMODIUM A-D) 2 MG tablet amt: 2mg ; oral Special Instructions: after each stool do not exceed more than 4tabs in 24 hours, As Needed  . Melatonin 10 MG TABS Take 10 mg by mouth at bedtime.  . memantine (NAMENDA) 10 MG tablet Take 10 mg by mouth 2 (two) times daily.  . metoprolol tartrate (LOPRESSOR) 25 MG tablet Take 1 tablet (25 mg total) by  mouth 2 (two) times daily.  . NON FORMULARY Diet - Mechanical Soft- NAS 1844ml fluid restriction/24 hrs.  . NON FORMULARY 1872ml fluid restriction/24 hrs. Document all fluid taken per shift including dietary tray and nursing combined. 7-3 = 835ml limit 3-11 = 855ml limit 11-7 = 157ml limit Every Shift Day, Evening, Night  . omeprazole (PRILOSEC) 20 MG capsule Take 1 capsule (20 mg total) by mouth daily before breakfast.  . ondansetron (ZOFRAN-ODT) 8 MG disintegrating tablet Take 8 mg by mouth 3 (three) times daily before meals. For vomiting and  nausea  . polyethylene glycol (MIRALAX / GLYCOLAX) 17 g packet Take 17 g by mouth daily as needed.  . potassium chloride SA (KLOR-CON) 20 MEQ tablet Take 20 mEq by mouth 2 (two) times daily.   Marland Kitchen rOPINIRole (REQUIP) 0.25 MG tablet Take 0.25 mg by mouth at bedtime.  . salicylic acid 17 % gel Apply 1 application topically in the morning and at bedtime. Apply to warts to left hand  . senna-docusate (SENOKOT-S) 8.6-50 MG tablet Take 2 tablets by mouth at bedtime.  . simethicone (MYLICON) 0000000 MG chewable tablet Chew 125 mg by mouth in the morning, at noon, in the evening, and at bedtime.  Marland Kitchen venlafaxine XR (EFFEXOR-XR) 150 MG 24 hr capsule Take 150 mg by mouth daily with breakfast. Along with 75 mg to = 225 mg  . venlafaxine XR (EFFEXOR-XR) 75 MG 24 hr capsule Take 75 mg by mouth daily with breakfast. Take along with 150 mg to = 225 mg  . [DISCONTINUED] furosemide (LASIX) 20 MG tablet Take 1 tablet (20 mg total) by mouth See admin instructions. Please take 2 tablets (40 mg) QAM starting 06/30/2020 through 07/04/2020 inclusive--and then 1 Tab (20 mg   No facility-administered encounter medications on file as of 06/30/2020.     SIGNIFICANT DIAGNOSTIC EXAMS   PREVIOUS   03-09-20: right hip x-ray: Minimally displaced right femoral neck fracture.  03-09-20: chest x-ray: Peribronchial thickening with streaky perihilar infiltrates suggesting bronchitis or  multifocal pneumonia.  03-12-20: renal ultrasound: No acute abnormality. Bilateral renal cysts  03-12-20: chest x-ray: Lower lung volumes. No acute cardiopulmonary abnormality.   05-22-20: chest x-ray:  1. Mild right basilar atelectasis. 2. Improved cardiomegaly from prior. Stable aortic atherosclerosis and tortuosity  05-22-20: ct of abdomen and pelvis:  Findings consistent with colitis involving the distal transverse colon, splenic flexure, and descending colon. This is likely ischemic or infectious in etiology. Diverticulosis is present but mostly distal to the area of thickening and diverticulitis is therefore less likely. 3 mm nonobstructing left renal calculus. Small bladder diverticula.  05-26-20: ct angio of abdomen and pelvis:  1. Persistent colitis involving distal transverse colon, splenic flexure, and descending colon. 2. Mild lower abdominal ascites, slightly increased on a. 3. 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 The celiac axis and superior mesenteric artery remain widely patent however, which typically allows adequate collateral perfusion. 4. Increase in small bilateral pleural effusions and dependent atelectasis/consolidation in both lung bases since previous study. 5. Sigmoid diverticulosis. 6. 1 mm mid left ureteral calculus with stable left renal pelvicaliectasis. 7. Right nephrolithiasis. 8. Bilateral renal artery ostial stenosis of possible hemodynamic significance.  9. Aortic Atherosclerosis  06-05-20: ct of abdomen and pelvis:  Mild colonic distension with air-fluid levels and segmental wall thickening likely reflects colitis. Additional stable findings detailed above.  06-09-20: acute abdomen:  Dilated and air-filled colon is noted suggesting possible ileus. No acute cardiopulmonary disease  06-09-20: ct of abdomen and pelvis:  Cholelithiasis. Haziness noted around the gallbladder. Cannot exclude acute cholecystitis. Continued wall  thickening in the left colon from the distal transverse colon through the distal descending colon concerning for colitis. Left colonic diverticulosis. Small bilateral pleural effusions, increasing since prior study. Bibasilar atelectasis. Coronary artery disease, aortic atherosclerosis.  06-09-20: abdominal ultrasound:  1. Moderate gallbladder sludge with probable punctate stones. Slight increased wall thickness with small amount of fluid adjacent to the gallbladder but negative sonographic Murphy. Findings are suggestive of but not definitive for cholecystitis; consider correlation with nuclear medicine hepatobiliary  imaging. 6 mm gallbladder polyp. 2. Simple appearing renal cysts. 3. Incidental note made of pleural effusions.  06-12-20: HIDA:  1. No scintigraphic evidence of acute cholecystitis. 2. Findings compatible with severe biliary dyskinesia with little to no gallbladder emptying and elicitation of abdominal pain with the CCK administration.  06-13-20: chest x-ray:  Diffuse interstitial opacity with Awanda Mink lines with possible trace pleural fluid. Stable heart size and aortic contours. Negative for air leak  NO NEW EXAMS.      LABS REVIEWED PREVIOUS    03-09-20: wbc 13.7; hgb 12.1; hct 37.7; mcv 80.4 plt 205; glucose 208; bun 21; creat 1.50; k+ 3.2; na++ 141; ca 9.2; hgb a1c 5.9; HIV: nr 03-10-20: wbc 11.2; hgb 11.3; hct 35.9; mcv 82.7 plt 188; glucose 160; bun 18; creat 1.21; k+ 3.3; na++ 139; ca 8.8 liver normal 3.0; mag 2.3 03-12-20: wbc 10.4; hgb 9.3; hct 29.6; mcv 84.3 plt 178; glucose 138; bun 34; creat 1.77; k+ 3.8; na++ 138; ca 8.3 03-14-20: glucose 134; bun 36; creat 1.23; k+ 3.2; na++ 139; ca 8.8 mag 2.1   03-16-20: hgb 10.7; hct 33.4 glucose 127; bun 18; creat 0.86 k+ 3.3; na++ 139; ca 9.2 ;liver normal albumin 2.5 iron 23; tibc 221; ferritin 137 03-23-20: k+ 3.0  03-27-20: glucose 133; bun 34; creat 0.85; k+ 3.6; na++ 142; ca 9.0; AM cortisol: 10.6  05-22-20: wbc 19.4; hgb  14.6; hct 43.8; mcv 81.6 plt 254; glucose 211; bun 35; creat 1.20; k+ 3.0; na++ 138; ca 9.8; liver normal albumin 3.5 mag 1.9; urine culture: <10,000; blood culture: no growth 05-25-20: wbc 14.2; hgb 20.8; hct 34.8; mcv 84.7 plt 212; glucose 109; bun 24; creat 0.86; k+ 3.4; na++ 137; ca 8.9; mag 1.9 05-28-20: wbc 10.9; hgb 11.8; hct 36.8; mcv 81.8 plt 227; glucose 132; bun 10; creat 0.82; k+ 3.0; na++ 137; ca 8.7 06-01-20: wbc 12.2; hgb 12.5; hct 40.3; mcv 81.9 plt 136; glucose 136; bun 12; creat 0.81; k+ 3.0; na++ 140; ca 8.9 mag 1.9 06-05-20: wbc 13.6; hgb 10.2; hct 32.9; mcv 93.5 plt 266 glucose 125; bun 35; creat 1.26; k+ 3.5; na++ 137; ca 8.9  06-07-20: wbc 11.9; hgb 9.6; hct 29.8; mcv 82.8 plt 318; glucose 114; bun 42; creat 1.47; k+ 3.9; na++ 138; ca 8.8 GFR 46; liver normal albumin 2.1  06-08-20: glucose 107; bun 57; creat 1.81; k+ 4.3; na++ 135; ca 8.9 GFR 36 06-09-20: glucose 107; bun 52; creat 1.64; k+ 5.3; na++ 136; ca 9.3;  GFR 40; liver normal albumin 2.3 06-10-20: wbc 12.6; hgb 10.5; hct 34.2; mcv 83.4 plt 417; glucose 126; bun 39; creat 4.4; na++ 137; ca 9.5 GFR 58; liver normal albumin 2.2 06-12-20: wbc 8.2; hgb 10.4; hct 33.6; mcv 81.2 plt 364; glucose 123; bun 24; creat 0.87; k+ 3.5; na++ 138; GFR >60; liver normal albumin 1.9 06-13-20: urine culture: <10,000 colonies 06-14-20: wbc 9.0; hgb 10.5; hct 33.8; mcv 80.5 plt 267; glucose 122; bun 20; creat 0.97; k+3.3; na++ 140; ca 9.0 GFR >60  TODAY  06-29-20: wbc 10.1; hgb 10.7; hct 36.1; mcv 83.0 plt 294; glucose 152; bun 26; creat 1.02; k+ 4.4; na++ 138 ca 8.8 GFR>60   Review of Systems  Constitutional: Negative for malaise/fatigue.  Respiratory: Negative for cough and shortness of breath.   Cardiovascular: Negative for chest pain, palpitations and leg swelling.  Gastrointestinal: Negative for abdominal pain, constipation and heartburn.  Musculoskeletal: Negative for back pain, joint pain and myalgias.  Skin: Negative.   Neurological:  Negative for dizziness.  Psychiatric/Behavioral: The patient is not nervous/anxious.     Physical Exam Constitutional:      General: He is not in acute distress.    Appearance: He is well-developed and well-nourished. He is not diaphoretic.  Neck:     Thyroid: No thyromegaly.  Cardiovascular:     Rate and Rhythm: Normal rate and regular rhythm.     Pulses: Normal pulses and intact distal pulses.     Heart sounds: Normal heart sounds.  Pulmonary:     Effort: Pulmonary effort is normal. No respiratory distress.     Breath sounds: Normal breath sounds.  Abdominal:     General: Bowel sounds are normal. There is no distension.     Palpations: Abdomen is soft.     Tenderness: There is no abdominal tenderness.  Musculoskeletal:        General: No edema.     Cervical back: Neck supple.     Right lower leg: No edema.     Left lower leg: No edema.     Comments: Is able to move all extremities  Status post right hip internal fixation 03-10-20      Lymphadenopathy:     Cervical: No cervical adenopathy.  Skin:    General: Skin is warm and dry.  Neurological:     Mental Status: He is alert and oriented to person, place, and time.  Psychiatric:        Mood and Affect: Mood and affect and mood normal.      ASSESSMENT/ PLAN:   TODAY  1. Ischemic colitis/biliary dyskinesia/calculus of gall bladder without cholecystitis without obstruction: is status post lap chole: will continue gasx 125 mg four times daily  2. Fluid volume overload: will continue lasix 40 mg daily through 07-04-20 then 20 mg daily will monitor his status.    3. Closed displaced fracture of right femoral neck: is status post right internal fixation 03-10-20 will continue tylenol 1 gm three times daily   4. Essential hypertension: is stable b/p 126/70 will conitne lopressor 25 mg twice daily cardizem cd 360 mg daily apresoline 50 mg three times daily   5. Gastroesophageal reflux disease without esophagitis: is stable  will continue prilosec 20 mg daily  6. Prostate cancer/overactive bladder: is stable will continue ly; urosatral 10 mg daily is not on oxybrutin due to history fracture   7. Hypokalemia: is stable k+ 4.4  will continue k+ 20 meq  daily .   8. Dyslipidemia: is stable will continue fish oil daily is off zocor   9. Chronic constipation: is stable will continue miralax and senna s as needed   10. Major depression recurrent chronic: is stable will continue effexor xr 225 mg daily and melatonin 10 mg nightly for sleep.   11. RLS: is stable will continue requip 0.25 mg nightly   12. Iron deficiency due to dietary intake: is stable hgb 10.7 will continue iron daily   13.  Interstitial lung disease: is stable uses 02 prn; had duoneb every 6 hours as needed  14. Dementia without behavioral disturbance unspecified dementia type: is stable will continue namenda 10 mg twice daily   15. Aortic atherosclerosis (CT 06-09-20) will monitor is on asa 81 mg daily   16. Protein calorie malnutrition, severe: is without change albumin 1.9 will continue prostat tid and supplements as directed will monitor     MD is aware of resident's narcotic use and is in agreement with current plan of care. We  will attempt to wean resident as appropriate.  Ok Edwards NP [facility]  Contact [phone] Monday through Friday 8am- 5pm  After hours call [phone]

## 2020-07-06 ENCOUNTER — Other Ambulatory Visit (HOSPITAL_COMMUNITY): Payer: Self-pay | Admitting: Radiology

## 2020-07-06 DIAGNOSIS — I509 Heart failure, unspecified: Secondary | ICD-10-CM

## 2020-07-07 DIAGNOSIS — F4323 Adjustment disorder with mixed anxiety and depressed mood: Secondary | ICD-10-CM | POA: Diagnosis not present

## 2020-07-11 ENCOUNTER — Ambulatory Visit (HOSPITAL_COMMUNITY)
Admission: RE | Admit: 2020-07-11 | Discharge: 2020-07-11 | Disposition: A | Discharge status: Home / Self Care | Payer: Medicare Other | Source: Ambulatory Visit | Attending: Adult Health | Admitting: Adult Health

## 2020-07-11 ENCOUNTER — Other Ambulatory Visit: Payer: Self-pay

## 2020-07-11 DIAGNOSIS — I4891 Unspecified atrial fibrillation: Secondary | ICD-10-CM | POA: Diagnosis not present

## 2020-07-13 ENCOUNTER — Non-Acute Institutional Stay (SKILLED_NURSING_FACILITY): Payer: Medicare Other | Admitting: Adult Health

## 2020-07-13 ENCOUNTER — Telehealth (INDEPENDENT_AMBULATORY_CARE_PROVIDER_SITE_OTHER): Payer: Medicare Other | Admitting: General Surgery

## 2020-07-13 ENCOUNTER — Encounter: Payer: Self-pay | Admitting: Adult Health

## 2020-07-13 DIAGNOSIS — I700 Atherosclerosis of aorta: Secondary | ICD-10-CM | POA: Diagnosis not present

## 2020-07-13 DIAGNOSIS — K828 Other specified diseases of gallbladder: Secondary | ICD-10-CM

## 2020-07-13 DIAGNOSIS — J849 Interstitial pulmonary disease, unspecified: Secondary | ICD-10-CM | POA: Diagnosis not present

## 2020-07-13 DIAGNOSIS — F0390 Unspecified dementia without behavioral disturbance: Secondary | ICD-10-CM | POA: Diagnosis not present

## 2020-07-13 DIAGNOSIS — F4323 Adjustment disorder with mixed anxiety and depressed mood: Secondary | ICD-10-CM | POA: Diagnosis not present

## 2020-07-13 NOTE — Progress Notes (Signed)
[facility]  I am calling the patient for post operative evaluation. This is not a billable encounter as it is under the [place] charges for the surgery.  The patient had a laparoscopic cholecystectomy on 06/28/2020. The patient's daughter in law sees him daily and reports that he is is doing great. He is happy he had the surgery. His appetite is back and he has no pain. The are tolerating a diet and wanting more food, having good pain control, and having regular Bms.  The incisions are healing with some bruising still. The patient's family has no concerns.  He weaned off the O2 post op since he was discharged.   Pathology: FINAL MICROSCOPIC DIAGNOSIS:   A. GALLBLADDER, CHOLECYSTECTOMY:  - Chronic cholecystitis with cholesterolosis and cholelithiasis.   Will see the patient PRN.   Curlene Labrum, MD [facility] [address] [place], [place] [address] [phone] (office)

## 2020-07-13 NOTE — Progress Notes (Signed)
Location:  [facility] Room Number: 156/D Place of Service:  SNF (31)   CODE STATUS: DNR  No Known Allergies  Chief Complaint  Patient presents with  . Acute Visit    Care Plan Meeting     HPI:  We have come together for her care plan meeting. Family present.  BIMS 15/15 mood 9/30.  He required limited to extensive assistance with adls. Is frequently incontinent of bladder and bowel. Is on 1800 cc fluid restriction. His lasix and k+ have been stopped per his request. Therapy: doing well in therapy supervision transfers for ambulation  Weaned off 02; supervision for all adls. Possible d/c from therapy in the next 2 weeks. Good appetite now; lost 15 pounds current weight is 175 pounds. He is improving. He feels that he is not getting enough food on his trays. Will change diet to regular. Will stop fluid restriction.  He will continue to be followed for his chronic illnesses including: Aortic atherosclerosis  Dementia without behavioral disturbance unspecified dementia type   Interstitial long disease  Past Medical History:  Diagnosis Date  . Anxiety   . Cancer [facility])    prostate  . Chronic chest wall pain   . Essential hypertension, benign 06/20/2016  . GAD (generalized anxiety disorder)   . GERD (gastroesophageal reflux disease)   . H/O echocardiogram 2016   normal  . Heart murmur    "leaking heart valve"  . High cholesterol 06/20/2016  . HOH (hard of hearing)    left  . Hx of falling   . Hyperlipidemia   . Interstitial pulmonary disease ([place])   . MDD (major depressive disorder)   . Murmur, cardiac   . Poor historian   . PUD (peptic ulcer disease)   . RLS (restless legs syndrome)   . Shortness of breath   . Wrist fracture 12/16/2011    Past Surgical History:  Procedure Laterality Date  . CHOLECYSTECTOMY N/A 06/28/2020   Procedure: LAPAROSCOPIC CHOLECYSTECTOMY;  Surgeon: [name], MD;  Location: [facility] ORS;  Service: General;  Laterality: N/A;   pt in [facility]  . COLONOSCOPY  2005   Rehman: Sigmoid colon diverticulosis, moderate sized external hemorrhoids. A few focal areas of mucosal erythema felt to be nonspecific.  Marland Kitchen ESOPHAGOGASTRODUODENOSCOPY    . ESOPHAGOGASTRODUODENOSCOPY N/A 07/18/2016   Rehman: normal exam except duodenal scar. h.pylori serologies negative  . FLEXIBLE SIGMOIDOSCOPY N/A 06/11/2020   Procedure: FLEXIBLE SIGMOIDOSCOPY;  Surgeon: Eloise Harman, DO;  Location: [facility] ENDO SUITE;  Service: Endoscopy;  Laterality: N/A;  . HIP PINNING,CANNULATED Right 03/10/2020   Procedure: INTERNAL FIXATION RIGHT HIP;  Surgeon: Carole Civil, MD;  Location: [facility] ORS;  Service: Orthopedics;  Laterality: Right;  . ORIF WRIST FRACTURE  12/19/2011   Procedure: OPEN REDUCTION INTERNAL FIXATION (ORIF) WRIST FRACTURE;  Surgeon: Carole Civil, MD;  Location: [facility] ORS;  Service: Orthopedics;  Laterality: Right;  . prostate cancer     Diagnosed in the 90s  . TONSILLECTOMY      Social History   Socioeconomic History  . Marital status: Widowed    Spouse name: Not on file  . Number of children: Not on file  . Years of education: Not on file  . Highest education level: Not on file  Occupational History  . Occupation: retired    Fish farm manager: RETIRED  Tobacco Use  . Smoking status: Never Smoker  . Smokeless tobacco: Never Used  Vaping Use  . Vaping Use: Never used  Substance and Sexual Activity  . Alcohol use: No  . Drug use: No  . Sexual activity: Not on file  Other Topics Concern  . Not on file  Social History Narrative  . Not on file   Social Determinants of Health   Financial Resource Strain: Not on file  Food Insecurity: Not on file  Transportation Needs: Not on file  Physical Activity: Not on file  Stress: Not on file  Social Connections: Not on file  Intimate Partner Violence: Not on file   Family History  Problem Relation Age of Onset  . Cancer Other   . Heart attack Mother   . Cancer Brother   . Heart  attack Brother   . Heart disease Sister        by pass      VITAL SIGNS BP (!) 141/79   Pulse 70   Temp 98.8 F (37.1 C)   Resp 16   Ht 5\' 9"  (1.753 m)   Wt 177 lb (80.3 kg)   SpO2 98%   BMI 26.14 kg/m   Outpatient Encounter Medications as of 07/13/2020  Medication Sig  . acetaminophen (TYLENOL) 500 MG tablet Take 1,000 mg by mouth in the morning, at noon, and at bedtime.  Marland Kitchen alfuzosin (UROXATRAL) 10 MG 24 hr tablet Take 10 mg by mouth daily with breakfast.  . Amino Acids-Protein Hydrolys (PRO-STAT PO) Take 60 mLs by mouth 3 (three) times daily with meals.  . Ascorbic Acid (VITAMIN C) 1000 MG tablet Take 1,000 mg by mouth daily.  Marland Kitchen aspirin 81 MG chewable tablet Chew 1 tablet (81 mg total) by mouth daily with breakfast.  . Balsam [place]-Castor Oil (VENELEX) OINT Special Instructions: Apply to bilateral buttocks & sacral area qshift for blanchable erythema.  . calcium carbonate (TUMS - DOSED IN MG ELEMENTAL CALCIUM) 500 MG chewable tablet Chew 1 tablet by mouth 2 (two) times daily as needed for indigestion or heartburn.   . diltiazem (CARDIZEM CD) 360 MG 24 hr capsule Take 1 capsule (360 mg total) by mouth daily.  . ferrous sulfate 325 (65 FE) MG tablet Take 325 mg by mouth daily with breakfast. For Anemia and  Iron Deficiency  . Flax Oil-Fish Oil-Borage Oil (FISH OIL-FLAX OIL-BORAGE OIL) CAPS Take 1 capsule by mouth daily.  . hydrALAZINE (APRESOLINE) 50 MG tablet Take 1 tablet (50 mg total) by mouth 3 (three) times daily.  Marland Kitchen ipratropium-albuterol (DUONEB) 0.5-2.5 (3) MG/3ML SOLN Take 3 mLs by nebulization every 6 (six) hours as needed.  . isosorbide mononitrate (IMDUR) 30 MG 24 hr tablet Take 1 tablet (30 mg total) by mouth daily.  Marland Kitchen lisinopril (ZESTRIL) 40 MG tablet Take 1 tablet (40 mg total) by mouth daily.  Marland Kitchen loperamide (IMODIUM A-D) 2 MG tablet amt: 2mg ; oral Special Instructions: after each stool do not exceed more than 4tabs in 24 hours, As Needed  . Melatonin 10 MG TABS Take 10  mg by mouth at bedtime.  . memantine (NAMENDA) 10 MG tablet Take 10 mg by mouth 2 (two) times daily.  . metoprolol tartrate (LOPRESSOR) 25 MG tablet Take 1 tablet (25 mg total) by mouth 2 (two) times daily.  . NON FORMULARY Diet - Mechanical Soft- NAS 1827ml fluid restriction/24 hrs.  . NON FORMULARY 182ml fluid restriction/24 hrs. Dietary is to provide 840 mL. Nursing is to provide: 7-3 420 mL, 3-11: 360 mL, and 11-7: 180 mL. Nursing to record amount of fluids given per shift. Every Shift Day, Evening, Night  .  omeprazole (PRILOSEC) 20 MG capsule Take 1 capsule (20 mg total) by mouth daily before breakfast.  . ondansetron (ZOFRAN-ODT) 8 MG disintegrating tablet Take 8 mg by mouth 3 (three) times daily before meals. For vomiting and nausea  . OXYGEN Inhale 2 L into the lungs continuous.  . polyethylene glycol (MIRALAX / GLYCOLAX) 17 g packet Take 17 g by mouth daily as needed.  Marland Kitchen rOPINIRole (REQUIP) 0.25 MG tablet Take 0.25 mg by mouth at bedtime.  . salicylic acid 17 % gel Apply 1 application topically in the morning and at bedtime. Apply to warts to left hand  . senna-docusate (SENOKOT S) 8.6-50 MG tablet Take 1 tablet by mouth at bedtime as needed for mild constipation.  . simethicone (MYLICON) 0000000 MG chewable tablet Chew 125 mg by mouth in the morning, at noon, in the evening, and at bedtime.  Marland Kitchen venlafaxine XR (EFFEXOR-XR) 150 MG 24 hr capsule Take 150 mg by mouth daily with breakfast. Along with 75 mg to = 225 mg  . venlafaxine XR (EFFEXOR-XR) 75 MG 24 hr capsule Take 75 mg by mouth daily with breakfast. Take along with 150 mg to = 225 mg  . [DISCONTINUED] furosemide (LASIX) 20 MG tablet Take 40 mg by mouth once daily from 06/30/2020-07/04/2020. Then take 20 mg by mouth once daily starting 07/05/2020  . [DISCONTINUED] potassium chloride SA (KLOR-CON) 20 MEQ tablet Take 20 mEq by mouth 2 (two) times daily.   . [DISCONTINUED] senna-docusate (SENOKOT-S) 8.6-50 MG tablet Take 2 tablets by mouth at  bedtime.   No facility-administered encounter medications on file as of 07/13/2020.     SIGNIFICANT DIAGNOSTIC EXAMS   PREVIOUS   03-09-20: right hip x-ray: Minimally displaced right femoral neck fracture.  03-09-20: chest x-ray: Peribronchial thickening with streaky perihilar infiltrates suggesting bronchitis or multifocal pneumonia.  03-12-20: renal ultrasound: No acute abnormality. Bilateral renal cysts  03-12-20: chest x-ray: Lower lung volumes. No acute cardiopulmonary abnormality.   05-22-20: chest x-ray:  1. Mild right basilar atelectasis. 2. Improved cardiomegaly from prior. Stable aortic atherosclerosis and tortuosity  05-22-20: ct of abdomen and pelvis:  Findings consistent with colitis involving the distal transverse colon, splenic flexure, and descending colon. This is likely ischemic or infectious in etiology. Diverticulosis is present but mostly distal to the area of thickening and diverticulitis is therefore less likely. 3 mm nonobstructing left renal calculus. Small bladder diverticula.  05-26-20: ct angio of abdomen and pelvis:  1. Persistent colitis involving distal transverse colon, splenic flexure, and descending colon. 2. Mild lower abdominal ascites, slightly increased on a. 3. 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 The celiac axis and superior mesenteric artery remain widely patent however, which typically allows adequate collateral perfusion. 4. Increase in small bilateral pleural effusions and dependent atelectasis/consolidation in both lung bases since previous study. 5. Sigmoid diverticulosis. 6. 1 mm mid left ureteral calculus with stable left renal pelvicaliectasis. 7. Right nephrolithiasis. 8. Bilateral renal artery ostial stenosis of possible hemodynamic significance.  9. Aortic Atherosclerosis  06-05-20: ct of abdomen and pelvis:  Mild colonic distension with air-fluid levels and segmental wall thickening likely reflects  colitis. Additional stable findings detailed above.  06-09-20: acute abdomen:  Dilated and air-filled colon is noted suggesting possible ileus. No acute cardiopulmonary disease  06-09-20: ct of abdomen and pelvis:  Cholelithiasis. Haziness noted around the gallbladder. Cannot exclude acute cholecystitis. Continued wall thickening in the left colon from the distal transverse colon through the distal descending colon concerning for colitis. Left colonic  diverticulosis. Small bilateral pleural effusions, increasing since prior study. Bibasilar atelectasis. Coronary artery disease, aortic atherosclerosis.  06-09-20: abdominal ultrasound:  1. Moderate gallbladder sludge with probable punctate stones. Slight increased wall thickness with small amount of fluid adjacent to the gallbladder but negative sonographic Murphy. Findings are suggestive of but not definitive for cholecystitis; consider correlation with nuclear medicine hepatobiliary imaging. 6 mm gallbladder polyp. 2. Simple appearing renal cysts. 3. Incidental note made of pleural effusions.  06-12-20: HIDA:  1. No scintigraphic evidence of acute cholecystitis. 2. Findings compatible with severe biliary dyskinesia with little to no gallbladder emptying and elicitation of abdominal pain with the CCK administration.  06-13-20: chest x-ray:  Diffuse interstitial opacity with Awanda Mink lines with possible trace pleural fluid. Stable heart size and aortic contours. Negative for air leak  NO NEW EXAMS.      LABS REVIEWED PREVIOUS    03-09-20: wbc 13.7; hgb 12.1; hct 37.7; mcv 80.4 plt 205; glucose 208; bun 21; creat 1.50; k+ 3.2; na++ 141; ca 9.2; hgb a1c 5.9; HIV: nr 03-10-20: wbc 11.2; hgb 11.3; hct 35.9; mcv 82.7 plt 188; glucose 160; bun 18; creat 1.21; k+ 3.3; na++ 139; ca 8.8 liver normal 3.0; mag 2.3 03-12-20: wbc 10.4; hgb 9.3; hct 29.6; mcv 84.3 plt 178; glucose 138; bun 34; creat 1.77; k+ 3.8; na++ 138; ca 8.3 03-14-20: glucose 134; bun  36; creat 1.23; k+ 3.2; na++ 139; ca 8.8 mag 2.1   03-16-20: hgb 10.7; hct 33.4 glucose 127; bun 18; creat 0.86 k+ 3.3; na++ 139; ca 9.2 ;liver normal albumin 2.5 iron 23; tibc 221; ferritin 137 03-23-20: k+ 3.0  03-27-20: glucose 133; bun 34; creat 0.85; k+ 3.6; na++ 142; ca 9.0; AM cortisol: 10.6  05-22-20: wbc 19.4; hgb 14.6; hct 43.8; mcv 81.6 plt 254; glucose 211; bun 35; creat 1.20; k+ 3.0; na++ 138; ca 9.8; liver normal albumin 3.5 mag 1.9; urine culture: <10,000; blood culture: no growth 05-25-20: wbc 14.2; hgb 20.8; hct 34.8; mcv 84.7 plt 212; glucose 109; bun 24; creat 0.86; k+ 3.4; na++ 137; ca 8.9; mag 1.9 05-28-20: wbc 10.9; hgb 11.8; hct 36.8; mcv 81.8 plt 227; glucose 132; bun 10; creat 0.82; k+ 3.0; na++ 137; ca 8.7 06-01-20: wbc 12.2; hgb 12.5; hct 40.3; mcv 81.9 plt 136; glucose 136; bun 12; creat 0.81; k+ 3.0; na++ 140; ca 8.9 mag 1.9 06-05-20: wbc 13.6; hgb 10.2; hct 32.9; mcv 93.5 plt 266 glucose 125; bun 35; creat 1.26; k+ 3.5; na++ 137; ca 8.9  06-07-20: wbc 11.9; hgb 9.6; hct 29.8; mcv 82.8 plt 318; glucose 114; bun 42; creat 1.47; k+ 3.9; na++ 138; ca 8.8 GFR 46; liver normal albumin 2.1  06-08-20: glucose 107; bun 57; creat 1.81; k+ 4.3; na++ 135; ca 8.9 GFR 36 06-09-20: glucose 107; bun 52; creat 1.64; k+ 5.3; na++ 136; ca 9.3;  GFR 40; liver normal albumin 2.3 06-10-20: wbc 12.6; hgb 10.5; hct 34.2; mcv 83.4 plt 417; glucose 126; bun 39; creat 4.4; na++ 137; ca 9.5 GFR 58; liver normal albumin 2.2 06-12-20: wbc 8.2; hgb 10.4; hct 33.6; mcv 81.2 plt 364; glucose 123; bun 24; creat 0.87; k+ 3.5; na++ 138; GFR >60; liver normal albumin 1.9 06-13-20: urine culture: <10,000 colonies 06-14-20: wbc 9.0; hgb 10.5; hct 33.8; mcv 80.5 plt 267; glucose 122; bun 20; creat 0.97; k+3.3; na++ 140; ca 9.0 GFR >60 06-29-20: wbc 10.1; hgb 10.7; hct 36.1; mcv 83.0 plt 294; glucose 152; bun 26; creat 1.02; k+ 4.4; na++ 138 ca  8.8 GFR>60  NO NEW LABS.    Review of Systems  Constitutional: Negative  for malaise/fatigue.  Respiratory: Negative for cough and shortness of breath.   Cardiovascular: Negative for chest pain, palpitations and leg swelling.  Gastrointestinal: Negative for abdominal pain, constipation and heartburn.  Musculoskeletal: Negative for back pain, joint pain and myalgias.  Skin: Negative.   Neurological: Negative for dizziness.  Psychiatric/Behavioral: The patient is not nervous/anxious.    Physical Exam Constitutional:      General: He is not in acute distress.    Appearance: He is well-developed and well-nourished. He is not diaphoretic.  Neck:     Thyroid: No thyromegaly.  Cardiovascular:     Rate and Rhythm: Normal rate and regular rhythm.     Pulses: Normal pulses and intact distal pulses.     Heart sounds: Normal heart sounds.  Pulmonary:     Effort: Pulmonary effort is normal. No respiratory distress.     Breath sounds: Normal breath sounds.  Abdominal:     General: Bowel sounds are normal. There is no distension.     Palpations: Abdomen is soft.     Tenderness: There is no abdominal tenderness.  Musculoskeletal:        General: No edema.     Cervical back: Neck supple.     Right lower leg: No edema.     Left lower leg: No edema.     Comments:  Is able to move all extremities  Status post right hip internal fixation 03-10-20       Lymphadenopathy:     Cervical: No cervical adenopathy.  Skin:    General: Skin is warm and dry.  Neurological:     Mental Status: He is alert and oriented to person, place, and time.  Psychiatric:        Mood and Affect: Mood and affect and mood normal.      ASSESSMENT/ PLAN:  TODAY  1. Aortic atherosclerosis 2. Dementia without behavioral disturbance unspecified dementia type  3. Interstitial long disease  Will continue current medications Will continue current plan of care Will continue to monitor his status.      MD is aware of resident's narcotic use and is in agreement with current plan of care.  We will attempt to wean resident as appropriate.  Ok Edwards NP [facility]  Contact [phone] Monday through Friday 8am- 5pm  After hours call [phone]

## 2020-07-14 ENCOUNTER — Encounter: Payer: Self-pay | Admitting: Adult Health

## 2020-07-14 ENCOUNTER — Non-Acute Institutional Stay (SKILLED_NURSING_FACILITY): Payer: Medicare Other | Admitting: Adult Health

## 2020-07-14 DIAGNOSIS — K219 Gastro-esophageal reflux disease without esophagitis: Secondary | ICD-10-CM

## 2020-07-14 DIAGNOSIS — C61 Malignant neoplasm of prostate: Secondary | ICD-10-CM

## 2020-07-14 DIAGNOSIS — N3281 Overactive bladder: Secondary | ICD-10-CM | POA: Diagnosis not present

## 2020-07-14 DIAGNOSIS — E876 Hypokalemia: Secondary | ICD-10-CM | POA: Diagnosis not present

## 2020-07-14 NOTE — Progress Notes (Signed)
Location:  [facility] Room Number: 156/D Place of Service:  SNF (31)   CODE STATUS: DNR  No Known Allergies  Chief Complaint  Patient presents with  . Short Term Rehab (STR)           Gastroesophageal reflux disease without esophagitis:  Prostate cancer/overactive bladder: hypokalemia    Weekly follow up for the first 30 days post hospitalization.     HPI:  He is a 87 year old long term resident of this facility being for the management of her chronic illnesses:Gastroesophageal reflux disease without esophagitis:  Prostate cancer/overactive bladder: hypokalemia. He is doing well. He continues to have periods of incontinence. He denies any pain; no nausea or vomiting. No diarrhea.    Past Medical History:  Diagnosis Date  . Anxiety   . Cancer [facility])    prostate  . Chronic chest wall pain   . Essential hypertension, benign 06/20/2016  . GAD (generalized anxiety disorder)   . GERD (gastroesophageal reflux disease)   . H/O echocardiogram 2016   normal  . Heart murmur    "leaking heart valve"  . High cholesterol 06/20/2016  . HOH (hard of hearing)    left  . Hx of falling   . Hyperlipidemia   . Interstitial pulmonary disease ([place])   . MDD (major depressive disorder)   . Murmur, cardiac   . Poor historian   . PUD (peptic ulcer disease)   . RLS (restless legs syndrome)   . Shortness of breath   . Wrist fracture 12/16/2011    Past Surgical History:  Procedure Laterality Date  . CHOLECYSTECTOMY N/A 06/28/2020   Procedure: LAPAROSCOPIC CHOLECYSTECTOMY;  Surgeon: [name], MD;  Location: [facility] ORS;  Service: General;  Laterality: N/A;  pt in [facility]  . COLONOSCOPY  2005   Rehman: Sigmoid colon diverticulosis, moderate sized external hemorrhoids. A few focal areas of mucosal erythema felt to be nonspecific.  Marland Kitchen ESOPHAGOGASTRODUODENOSCOPY    . ESOPHAGOGASTRODUODENOSCOPY N/A 07/18/2016   Rehman: normal exam except duodenal scar. h.pylori serologies  negative  . FLEXIBLE SIGMOIDOSCOPY N/A 06/11/2020   Procedure: FLEXIBLE SIGMOIDOSCOPY;  Surgeon: Eloise Harman, DO;  Location: [facility]O SUITE;  Service: Endoscopy;  Laterality: N/A;  . HIP PINNING,CANNULATED Right 03/10/2020   Procedure: INTERNAL FIXATION RIGHT HIP;  Surgeon: Carole Civil, MD;  Location: [facility] ORS;  Service: Orthopedics;  Laterality: Right;  . ORIF WRIST FRACTURE  12/19/2011   Procedure: OPEN REDUCTION INTERNAL FIXATION (ORIF) WRIST FRACTURE;  Surgeon: Carole Civil, MD;  Location: [facility];  Service: Orthopedics;  Laterality: Right;  . prostate cancer     Diagnosed in the 90s  . TONSILLECTOMY      Social History   Socioeconomic History  . Marital status: Widowed    Spouse name: Not on file  . Number of children: Not on file  . Years of education: Not on file  . Highest education level: Not on file  Occupational History  . Occupation: retired    Fish farm manager: RETIRED  Tobacco Use  . Smoking status: Never Smoker  . Smokeless tobacco: Never Used  Vaping Use  . Vaping Use: Never used  Substance and Sexual Activity  . Alcohol use: No  . Drug use: No  . Sexual activity: Not on file  Other Topics Concern  . Not on file  Social History Narrative  . Not on file   Social Determinants of Health   Financial Resource Strain: Not on file  Food Insecurity: Not on file  Transportation Needs: Not on file  Physical Activity: Not on file  Stress: Not on file  Social Connections: Not on file  Intimate Partner Violence: Not on file   Family History  Problem Relation Age of Onset  . Cancer Other   . Heart attack Mother   . Cancer Brother   . Heart attack Brother   . Heart disease Sister        by pass      VITAL SIGNS BP 140/80   Pulse 70   Temp 98.8 F (37.1 C)   Resp 16   Ht 5\' 9"  (1.753 m)   Wt 177 lb (80.3 kg)   SpO2 94%   BMI 26.14 kg/m   Outpatient Encounter Medications as of 07/14/2020  Medication Sig  . acetaminophen (TYLENOL) 500 MG tablet  Take 1,000 mg by mouth in the morning, at noon, and at bedtime.  Marland Kitchen alfuzosin (UROXATRAL) 10 MG 24 hr tablet Take 10 mg by mouth daily with breakfast.  . Amino Acids-Protein Hydrolys (PRO-STAT PO) Take 60 mLs by mouth 3 (three) times daily with meals.  . Ascorbic Acid (VITAMIN C) 1000 MG tablet Take 1,000 mg by mouth daily.  Marland Kitchen aspirin 81 MG chewable tablet Chew 1 tablet (81 mg total) by mouth daily with breakfast.  . Balsam [place]-Castor Oil (VENELEX) OINT Special Instructions: Apply to bilateral buttocks & sacral area qshift for blanchable erythema.  . calcium carbonate (TUMS - DOSED IN MG ELEMENTAL CALCIUM) 500 MG chewable tablet Chew 1 tablet by mouth 2 (two) times daily as needed for indigestion or heartburn.   . diltiazem (CARDIZEM CD) 360 MG 24 hr capsule Take 1 capsule (360 mg total) by mouth daily.  . ferrous sulfate 325 (65 FE) MG tablet Take 325 mg by mouth daily with breakfast. For Anemia and  Iron Deficiency  . Flax Oil-Fish Oil-Borage Oil (FISH OIL-FLAX OIL-BORAGE OIL) CAPS Take 1 capsule by mouth daily.  . hydrALAZINE (APRESOLINE) 50 MG tablet Take 1 tablet (50 mg total) by mouth 3 (three) times daily.  Marland Kitchen ipratropium-albuterol (DUONEB) 0.5-2.5 (3) MG/3ML SOLN Take 3 mLs by nebulization every 6 (six) hours as needed.  . isosorbide mononitrate (IMDUR) 30 MG 24 hr tablet Take 1 tablet (30 mg total) by mouth daily.  Marland Kitchen lisinopril (ZESTRIL) 40 MG tablet Take 1 tablet (40 mg total) by mouth daily.  Marland Kitchen loperamide (IMODIUM A-D) 2 MG tablet amt: 2mg ; oral Special Instructions: after each stool do not exceed more than 4tabs in 24 hours, As Needed  . Melatonin 10 MG TABS Take 10 mg by mouth at bedtime.  . memantine (NAMENDA) 10 MG tablet Take 10 mg by mouth 2 (two) times daily.  . metoprolol tartrate (LOPRESSOR) 25 MG tablet Take 1 tablet (25 mg total) by mouth 2 (two) times daily.  . NON FORMULARY Diet: Regular  . omeprazole (PRILOSEC) 20 MG capsule Take 1 capsule (20 mg total) by mouth daily  before breakfast.  . ondansetron (ZOFRAN-ODT) 8 MG disintegrating tablet Take 8 mg by mouth 3 (three) times daily before meals. For vomiting and nausea  . OXYGEN Inhale 2 L into the lungs continuous.  . polyethylene glycol (MIRALAX / GLYCOLAX) 17 g packet Take 17 g by mouth daily as needed.  Marland Kitchen rOPINIRole (REQUIP) 0.25 MG tablet Take 0.25 mg by mouth at bedtime.  . salicylic acid 17 % gel Apply 1 application topically in the morning and at bedtime. Apply to warts to left hand  .  senna-docusate (SENOKOT-S) 8.6-50 MG tablet Take 1 tablet by mouth at bedtime as needed for mild constipation.  . simethicone (MYLICON) 0000000 MG chewable tablet Chew 125 mg by mouth in the morning, at noon, in the evening, and at bedtime.  Marland Kitchen venlafaxine XR (EFFEXOR-XR) 150 MG 24 hr capsule Take 150 mg by mouth daily with breakfast. Along with 75 mg to = 225 mg  . venlafaxine XR (EFFEXOR-XR) 75 MG 24 hr capsule Take 75 mg by mouth daily with breakfast. Take along with 150 mg to = 225 mg  . [DISCONTINUED] NON FORMULARY 1814ml fluid restriction/24 hrs. Dietary is to provide 840 mL. Nursing is to provide: 7-3 420 mL, 3-11: 360 mL, and 11-7: 180 mL. Nursing to record amount of fluids given per shift. Every Shift Day, Evening, Night   No facility-administered encounter medications on file as of 07/14/2020.     SIGNIFICANT DIAGNOSTIC EXAMS   PREVIOUS   03-09-20: right hip x-ray: Minimally displaced right femoral neck fracture.  03-09-20: chest x-ray: Peribronchial thickening with streaky perihilar infiltrates suggesting bronchitis or multifocal pneumonia.  03-12-20: renal ultrasound: No acute abnormality. Bilateral renal cysts  03-12-20: chest x-ray: Lower lung volumes. No acute cardiopulmonary abnormality.   05-22-20: chest x-ray:  1. Mild right basilar atelectasis. 2. Improved cardiomegaly from prior. Stable aortic atherosclerosis and tortuosity  05-22-20: ct of abdomen and pelvis:  Findings consistent with colitis  involving the distal transverse colon, splenic flexure, and descending colon. This is likely ischemic or infectious in etiology. Diverticulosis is present but mostly distal to the area of thickening and diverticulitis is therefore less likely. 3 mm nonobstructing left renal calculus. Small bladder diverticula.  05-26-20: ct angio of abdomen and pelvis:  1. Persistent colitis involving distal transverse colon, splenic flexure, and descending colon. 2. Mild lower abdominal ascites, slightly increased on a. 3. 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 The celiac axis and superior mesenteric artery remain widely patent however, which typically allows adequate collateral perfusion. 4. Increase in small bilateral pleural effusions and dependent atelectasis/consolidation in both lung bases since previous study. 5. Sigmoid diverticulosis. 6. 1 mm mid left ureteral calculus with stable left renal pelvicaliectasis. 7. Right nephrolithiasis. 8. Bilateral renal artery ostial stenosis of possible hemodynamic significance.  9. Aortic Atherosclerosis  06-05-20: ct of abdomen and pelvis:  Mild colonic distension with air-fluid levels and segmental wall thickening likely reflects colitis. Additional stable findings detailed above.  06-09-20: acute abdomen:  Dilated and air-filled colon is noted suggesting possible ileus. No acute cardiopulmonary disease  06-09-20: ct of abdomen and pelvis:  Cholelithiasis. Haziness noted around the gallbladder. Cannot exclude acute cholecystitis. Continued wall thickening in the left colon from the distal transverse colon through the distal descending colon concerning for colitis. Left colonic diverticulosis. Small bilateral pleural effusions, increasing since prior study. Bibasilar atelectasis. Coronary artery disease, aortic atherosclerosis.  06-09-20: abdominal ultrasound:  1. Moderate gallbladder sludge with probable punctate stones. Slight  increased wall thickness with small amount of fluid adjacent to the gallbladder but negative sonographic Murphy. Findings are suggestive of but not definitive for cholecystitis; consider correlation with nuclear medicine hepatobiliary imaging. 6 mm gallbladder polyp. 2. Simple appearing renal cysts. 3. Incidental note made of pleural effusions.  06-12-20: HIDA:  1. No scintigraphic evidence of acute cholecystitis. 2. Findings compatible with severe biliary dyskinesia with little to no gallbladder emptying and elicitation of abdominal pain with the CCK administration.  06-13-20: chest x-ray:  Diffuse interstitial opacity with Awanda Mink lines with possible trace pleural  fluid. Stable heart size and aortic contours. Negative for air leak  NO NEW EXAMS.      LABS REVIEWED PREVIOUS    03-09-20: wbc 13.7; hgb 12.1; hct 37.7; mcv 80.4 plt 205; glucose 208; bun 21; creat 1.50; k+ 3.2; na++ 141; ca 9.2; hgb a1c 5.9; HIV: nr 03-10-20: wbc 11.2; hgb 11.3; hct 35.9; mcv 82.7 plt 188; glucose 160; bun 18; creat 1.21; k+ 3.3; na++ 139; ca 8.8 liver normal 3.0; mag 2.3 03-12-20: wbc 10.4; hgb 9.3; hct 29.6; mcv 84.3 plt 178; glucose 138; bun 34; creat 1.77; k+ 3.8; na++ 138; ca 8.3 03-14-20: glucose 134; bun 36; creat 1.23; k+ 3.2; na++ 139; ca 8.8 mag 2.1   03-16-20: hgb 10.7; hct 33.4 glucose 127; bun 18; creat 0.86 k+ 3.3; na++ 139; ca 9.2 ;liver normal albumin 2.5 iron 23; tibc 221; ferritin 137 03-23-20: k+ 3.0  03-27-20: glucose 133; bun 34; creat 0.85; k+ 3.6; na++ 142; ca 9.0; AM cortisol: 10.6  05-22-20: wbc 19.4; hgb 14.6; hct 43.8; mcv 81.6 plt 254; glucose 211; bun 35; creat 1.20; k+ 3.0; na++ 138; ca 9.8; liver normal albumin 3.5 mag 1.9; urine culture: <10,000; blood culture: no growth 05-25-20: wbc 14.2; hgb 20.8; hct 34.8; mcv 84.7 plt 212; glucose 109; bun 24; creat 0.86; k+ 3.4; na++ 137; ca 8.9; mag 1.9 05-28-20: wbc 10.9; hgb 11.8; hct 36.8; mcv 81.8 plt 227; glucose 132; bun 10; creat 0.82; k+  3.0; na++ 137; ca 8.7 06-01-20: wbc 12.2; hgb 12.5; hct 40.3; mcv 81.9 plt 136; glucose 136; bun 12; creat 0.81; k+ 3.0; na++ 140; ca 8.9 mag 1.9 06-05-20: wbc 13.6; hgb 10.2; hct 32.9; mcv 93.5 plt 266 glucose 125; bun 35; creat 1.26; k+ 3.5; na++ 137; ca 8.9  06-07-20: wbc 11.9; hgb 9.6; hct 29.8; mcv 82.8 plt 318; glucose 114; bun 42; creat 1.47; k+ 3.9; na++ 138; ca 8.8 GFR 46; liver normal albumin 2.1  06-08-20: glucose 107; bun 57; creat 1.81; k+ 4.3; na++ 135; ca 8.9 GFR 36 06-09-20: glucose 107; bun 52; creat 1.64; k+ 5.3; na++ 136; ca 9.3;  GFR 40; liver normal albumin 2.3 06-10-20: wbc 12.6; hgb 10.5; hct 34.2; mcv 83.4 plt 417; glucose 126; bun 39; creat 4.4; na++ 137; ca 9.5 GFR 58; liver normal albumin 2.2 06-12-20: wbc 8.2; hgb 10.4; hct 33.6; mcv 81.2 plt 364; glucose 123; bun 24; creat 0.87; k+ 3.5; na++ 138; GFR >60; liver normal albumin 1.9 06-13-20: urine culture: <10,000 colonies 06-14-20: wbc 9.0; hgb 10.5; hct 33.8; mcv 80.5 plt 267; glucose 122; bun 20; creat 0.97; k+3.3; na++ 140; ca 9.0 GFR >60 06-29-20: wbc 10.1; hgb 10.7; hct 36.1; mcv 83.0 plt 294; glucose 152; bun 26; creat 1.02; k+ 4.4; na++ 138 ca 8.8 GFR>60   NO NEW LABS.   Review of Systems  Constitutional: Negative for malaise/fatigue.  Respiratory: Negative for cough and shortness of breath.   Cardiovascular: Negative for chest pain, palpitations and leg swelling.  Gastrointestinal: Negative for abdominal pain, constipation and heartburn.  Musculoskeletal: Negative for back pain, joint pain and myalgias.  Skin: Negative.   Neurological: Negative for dizziness.  Psychiatric/Behavioral: The patient is not nervous/anxious.     Physical Exam Constitutional:      General: He is not in acute distress.    Appearance: He is well-developed and well-nourished. He is not diaphoretic.  Neck:     Thyroid: No thyromegaly.  Cardiovascular:     Rate and Rhythm: Normal rate and regular  rhythm.     Pulses: Normal pulses and  intact distal pulses.     Heart sounds: Normal heart sounds.  Pulmonary:     Effort: Pulmonary effort is normal. No respiratory distress.     Breath sounds: Normal breath sounds.  Abdominal:     General: Bowel sounds are normal. There is no distension.     Palpations: Abdomen is soft.     Tenderness: There is no abdominal tenderness.  Musculoskeletal:        General: No edema.     Cervical back: Neck supple.     Right lower leg: No edema.     Left lower leg: No edema.     Comments: Is able to move all extremities  Status post right hip internal fixation 03-10-20        Lymphadenopathy:     Cervical: No cervical adenopathy.  Skin:    General: Skin is warm and dry.  Neurological:     Mental Status: He is alert and oriented to person, place, and time.  Psychiatric:        Mood and Affect: Mood and affect and mood normal.     TODAY  1. Gastroesophageal reflux disease without esophagitis: is stable will continue prilosec 20 mg daily  2. Prostate cancer/overactive bladder: is stable will continue urosatral 10 mg daily is not on oxybutrin due to history of fracture.   3 hypokalemia: is stable k+ 4.4 is off supplement.   PREVIOUS   4. Dyslipidemia: is stable will continue fish oil daily is off zocor   5. Chronic constipation: is stable will continue miralax and senna s as needed   6. Major depression recurrent chronic: is stable will continue effexor xr 150 mg daily and melatonin 10 mg nightly for sleep.   7. RLS: is stable will continue requip 0.25 mg nightly   8. Iron deficiency due to dietary intake: is stable hgb 10.5 will continue iron daily   9.  Interstitial lung disease: is stable uses 02 prn; had duoneb every 6 hours as needed  10. Dementia without behavioral disturbance unspecified dementia type: is stable will continue namenda 10 mg twice daily   11. Aortic atherosclerosis (CT 06-09-20) will monitor is on asa 81 mg daily   12. Protein calorie malnutrition,  severe: is without change albumin 1.9 will continue prostat tid and supplements as directed will monitor   11. Ischemic colitis/biliary dyskinesia/calculus of gall bladder without cholecystitis without obstruction: is presently stable will continue bland diet will continue zofran 8 mg prior to meals for nausea;  Will contintue  gas x 125 mg four times daily   12. Closed displaced fracture of right femoral neck: is status post right internal fixation 03-10-20 will continue tylenol 1 gm three times daily   13. Essential hypertension: is stable b/p 140/80 will conitne lopressor 25 mg twice daily cardizem cd 360 mg daily lisinopril 40 mg daily           MD is aware of resident's narcotic use and is in agreement with current plan of care. We will attempt to wean resident as appropriate.  Ok Edwards NP [facility]  Contact [phone] Monday through Friday 8am- 5pm  After hours call [phone]

## 2020-07-24 ENCOUNTER — Non-Acute Institutional Stay (SKILLED_NURSING_FACILITY): Payer: Medicare Other | Admitting: Adult Health

## 2020-07-24 ENCOUNTER — Other Ambulatory Visit: Payer: Medicare Other

## 2020-07-24 ENCOUNTER — Encounter: Payer: Self-pay | Admitting: Adult Health

## 2020-07-24 DIAGNOSIS — F339 Major depressive disorder, recurrent, unspecified: Secondary | ICD-10-CM

## 2020-07-24 DIAGNOSIS — K5909 Other constipation: Secondary | ICD-10-CM

## 2020-07-24 DIAGNOSIS — E785 Hyperlipidemia, unspecified: Secondary | ICD-10-CM | POA: Diagnosis not present

## 2020-07-24 DIAGNOSIS — C61 Malignant neoplasm of prostate: Secondary | ICD-10-CM

## 2020-07-24 NOTE — Progress Notes (Signed)
Location:  [facility] Room Number: 156/D Place of Service:  SNF (31)   CODE STATUS: DNR  No Known Allergies  Chief Complaint  Patient presents with  . Short Term Rehab (STR)              Dyslipidemia:     Chronic constipation:  Major depression recurrent chronic    HPI:  He is a 87 year old long term resident of this facility being seen for the management of his chronic illnesses;  Dyslipidemia:     Chronic constipation:  Major depression recurrent chronic. He tells me that he is feeling good. He denies any pain; no nausea or vomiting. He states that he has a good appetite and denies any issues with constipation or diarrhea.   Past Medical History:  Diagnosis Date  . Anxiety   . Cancer [facility])    prostate  . Chronic chest wall pain   . Essential hypertension, benign 06/20/2016  . GAD (generalized anxiety disorder)   . GERD (gastroesophageal reflux disease)   . H/O echocardiogram 2016   normal  . Heart murmur    "leaking heart valve"  . High cholesterol 06/20/2016  . HOH (hard of hearing)    left  . Hx of falling   . Hyperlipidemia   . Interstitial pulmonary disease ([place])   . MDD (major depressive disorder)   . Murmur, cardiac   . Poor historian   . PUD (peptic ulcer disease)   . RLS (restless legs syndrome)   . Shortness of breath   . Wrist fracture 12/16/2011    Past Surgical History:  Procedure Laterality Date  . CHOLECYSTECTOMY N/A 06/28/2020   Procedure: LAPAROSCOPIC CHOLECYSTECTOMY;  Surgeon: [name], MD;  Location: [facility] ORS;  Service: General;  Laterality: N/A;  pt in [facility]  . COLONOSCOPY  2005   Rehman: Sigmoid colon diverticulosis, moderate sized external hemorrhoids. A few focal areas of mucosal erythema felt to be nonspecific.  Marland Kitchen ESOPHAGOGASTRODUODENOSCOPY    . ESOPHAGOGASTRODUODENOSCOPY N/A 07/18/2016   Rehman: normal exam except duodenal scar. h.pylori serologies negative  . FLEXIBLE SIGMOIDOSCOPY N/A 06/11/2020    Procedure: FLEXIBLE SIGMOIDOSCOPY;  Surgeon: Eloise Harman, DO;  Location: [facility] ENDO SUITE;  Service: Endoscopy;  Laterality: N/A;  . HIP PINNING,CANNULATED Right 03/10/2020   Procedure: INTERNAL FIXATION RIGHT HIP;  Surgeon: Carole Civil, MD;  Location: [facility] ORS;  Service: Orthopedics;  Laterality: Right;  . ORIF WRIST FRACTURE  12/19/2011   Procedure: OPEN REDUCTION INTERNAL FIXATION (ORIF) WRIST FRACTURE;  Surgeon: Carole Civil, MD;  Location: [facility] ORS;  Service: Orthopedics;  Laterality: Right;  . prostate cancer     Diagnosed in the 90s  . TONSILLECTOMY      Social History   Socioeconomic History  . Marital status: Widowed    Spouse name: Not on file  . Number of children: Not on file  . Years of education: Not on file  . Highest education level: Not on file  Occupational History  . Occupation: retired    Fish farm manager: RETIRED  Tobacco Use  . Smoking status: Never Smoker  . Smokeless tobacco: Never Used  Vaping Use  . Vaping Use: Never used  Substance and Sexual Activity  . Alcohol use: No  . Drug use: No  . Sexual activity: Not on file  Other Topics Concern  . Not on file  Social History Narrative  . Not on file   Social Determinants of Health   Financial  Resource Strain: Not on file  Food Insecurity: Not on file  Transportation Needs: Not on file  Physical Activity: Not on file  Stress: Not on file  Social Connections: Not on file  Intimate Partner Violence: Not on file   Family History  Problem Relation Age of Onset  . Cancer Other   . Heart attack Mother   . Cancer Brother   . Heart attack Brother   . Heart disease Sister        by pass      VITAL SIGNS BP (!) 164/78   Pulse 80   Temp 97.7 F (36.5 C)   Resp 20   Ht 5\' 9"  (1.753 m)   Wt 174 lb 3.2 oz (79 kg)   SpO2 96%   BMI 25.72 kg/m   Outpatient Encounter Medications as of 07/24/2020  Medication Sig  . acetaminophen (TYLENOL) 500 MG tablet Take 1,000 mg by mouth in the morning, at  noon, and at bedtime.  Marland Kitchen alfuzosin (UROXATRAL) 10 MG 24 hr tablet Take 10 mg by mouth daily with breakfast.  . Amino Acids-Protein Hydrolys (PRO-STAT PO) Take 60 mLs by mouth 3 (three) times daily with meals.  . Ascorbic Acid (VITAMIN C) 1000 MG tablet Take 1,000 mg by mouth daily.  Marland Kitchen aspirin 81 MG chewable tablet Chew 1 tablet (81 mg total) by mouth daily with breakfast.  . Balsam [place]-Castor Oil (VENELEX) OINT Special Instructions: Apply to bilateral buttocks & sacral area qshift for blanchable erythema.  . calcium carbonate (TUMS - DOSED IN MG ELEMENTAL CALCIUM) 500 MG chewable tablet Chew 1 tablet by mouth 2 (two) times daily as needed for indigestion or heartburn.   . diltiazem (CARDIZEM CD) 360 MG 24 hr capsule Take 1 capsule (360 mg total) by mouth daily.  . ferrous sulfate 325 (65 FE) MG tablet Take 325 mg by mouth daily with breakfast. For Anemia and  Iron Deficiency  . Flax Oil-Fish Oil-Borage Oil (FISH OIL-FLAX OIL-BORAGE OIL) CAPS Take 1 capsule by mouth daily.  . hydrALAZINE (APRESOLINE) 50 MG tablet Take 1 tablet (50 mg total) by mouth 3 (three) times daily.  Marland Kitchen ipratropium-albuterol (DUONEB) 0.5-2.5 (3) MG/3ML SOLN Take 3 mLs by nebulization every 6 (six) hours as needed.  . isosorbide mononitrate (IMDUR) 30 MG 24 hr tablet Take 1 tablet (30 mg total) by mouth daily.  Marland Kitchen lisinopril (ZESTRIL) 40 MG tablet Take 1 tablet (40 mg total) by mouth daily.  Marland Kitchen loperamide (IMODIUM A-D) 2 MG tablet amt: 2mg ; oral Special Instructions: after each stool do not exceed more than 4tabs in 24 hours, As Needed  . Melatonin 10 MG TABS Take 10 mg by mouth at bedtime.  . memantine (NAMENDA) 10 MG tablet Take 10 mg by mouth 2 (two) times daily.  . metoprolol tartrate (LOPRESSOR) 25 MG tablet Take 1 tablet (25 mg total) by mouth 2 (two) times daily.  . NON FORMULARY Diet: Regular  . omeprazole (PRILOSEC) 20 MG capsule Take 1 capsule (20 mg total) by mouth daily before breakfast.  . ondansetron (ZOFRAN-ODT)  8 MG disintegrating tablet Take 8 mg by mouth 3 (three) times daily as needed. For vomiting and nausea  . OXYGEN Inhale 2 L into the lungs continuous.  . polyethylene glycol (MIRALAX / GLYCOLAX) 17 g packet Take 17 g by mouth daily as needed.  Marland Kitchen rOPINIRole (REQUIP) 0.25 MG tablet Take 0.25 mg by mouth at bedtime.  . salicylic acid 17 % gel Apply 1 application topically in the morning  and at bedtime. Apply to warts to left hand  . senna-docusate (SENOKOT-S) 8.6-50 MG tablet Take 1 tablet by mouth at bedtime as needed for mild constipation.  . simethicone (MYLICON) 622 MG chewable tablet Chew 125 mg by mouth in the morning, at noon, in the evening, and at bedtime.  Marland Kitchen venlafaxine XR (EFFEXOR-XR) 150 MG 24 hr capsule Take 150 mg by mouth daily with breakfast. Along with 75 mg to = 225 mg  . venlafaxine XR (EFFEXOR-XR) 75 MG 24 hr capsule Take 75 mg by mouth daily with breakfast. Take along with 150 mg to = 225 mg   No facility-administered encounter medications on file as of 07/24/2020.     SIGNIFICANT DIAGNOSTIC EXAMS  PREVIOUS   03-09-20: right hip x-ray: Minimally displaced right femoral neck fracture.  03-09-20: chest x-ray: Peribronchial thickening with streaky perihilar infiltrates suggesting bronchitis or multifocal pneumonia.  03-12-20: renal ultrasound: No acute abnormality. Bilateral renal cysts  03-12-20: chest x-ray: Lower lung volumes. No acute cardiopulmonary abnormality.   05-22-20: chest x-ray:  1. Mild right basilar atelectasis. 2. Improved cardiomegaly from prior. Stable aortic atherosclerosis and tortuosity  05-22-20: ct of abdomen and pelvis:  Findings consistent with colitis involving the distal transverse colon, splenic flexure, and descending colon. This is likely ischemic or infectious in etiology. Diverticulosis is present but mostly distal to the area of thickening and diverticulitis is therefore less likely. 3 mm nonobstructing left renal calculus. Small bladder  diverticula.  05-26-20: ct angio of abdomen and pelvis:  1. Persistent colitis involving distal transverse colon, splenic flexure, and descending colon. 2. Mild lower abdominal ascites, slightly increased on a. 3. 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 The celiac axis and superior mesenteric artery remain widely patent however, which typically allows adequate collateral perfusion. 4. Increase in small bilateral pleural effusions and dependent atelectasis/consolidation in both lung bases since previous study. 5. Sigmoid diverticulosis. 6. 1 mm mid left ureteral calculus with stable left renal pelvicaliectasis. 7. Right nephrolithiasis. 8. Bilateral renal artery ostial stenosis of possible hemodynamic significance.  9. Aortic Atherosclerosis  06-05-20: ct of abdomen and pelvis:  Mild colonic distension with air-fluid levels and segmental wall thickening likely reflects colitis. Additional stable findings detailed above.  06-09-20: acute abdomen:  Dilated and air-filled colon is noted suggesting possible ileus. No acute cardiopulmonary disease  06-09-20: ct of abdomen and pelvis:  Cholelithiasis. Haziness noted around the gallbladder. Cannot exclude acute cholecystitis. Continued wall thickening in the left colon from the distal transverse colon through the distal descending colon concerning for colitis. Left colonic diverticulosis. Small bilateral pleural effusions, increasing since prior study. Bibasilar atelectasis. Coronary artery disease, aortic atherosclerosis.  06-09-20: abdominal ultrasound:  1. Moderate gallbladder sludge with probable punctate stones. Slight increased wall thickness with small amount of fluid adjacent to the gallbladder but negative sonographic Murphy. Findings are suggestive of but not definitive for cholecystitis; consider correlation with nuclear medicine hepatobiliary imaging. 6 mm gallbladder polyp. 2. Simple appearing renal cysts. 3.  Incidental note made of pleural effusions.  06-12-20: HIDA:  1. No scintigraphic evidence of acute cholecystitis. 2. Findings compatible with severe biliary dyskinesia with little to no gallbladder emptying and elicitation of abdominal pain with the CCK administration.  06-13-20: chest x-ray:  Diffuse interstitial opacity with Awanda Mink lines with possible trace pleural fluid. Stable heart size and aortic contours. Negative for air leak  NO NEW EXAMS.      LABS REVIEWED PREVIOUS    03-09-20: wbc 13.7; hgb 12.1; hct 37.7;  mcv 80.4 plt 205; glucose 208; bun 21; creat 1.50; k+ 3.2; na++ 141; ca 9.2; hgb a1c 5.9; HIV: nr 03-10-20: wbc 11.2; hgb 11.3; hct 35.9; mcv 82.7 plt 188; glucose 160; bun 18; creat 1.21; k+ 3.3; na++ 139; ca 8.8 liver normal 3.0; mag 2.3 03-12-20: wbc 10.4; hgb 9.3; hct 29.6; mcv 84.3 plt 178; glucose 138; bun 34; creat 1.77; k+ 3.8; na++ 138; ca 8.3 03-14-20: glucose 134; bun 36; creat 1.23; k+ 3.2; na++ 139; ca 8.8 mag 2.1   03-16-20: hgb 10.7; hct 33.4 glucose 127; bun 18; creat 0.86 k+ 3.3; na++ 139; ca 9.2 ;liver normal albumin 2.5 iron 23; tibc 221; ferritin 137 03-23-20: k+ 3.0  03-27-20: glucose 133; bun 34; creat 0.85; k+ 3.6; na++ 142; ca 9.0; AM cortisol: 10.6  05-22-20: wbc 19.4; hgb 14.6; hct 43.8; mcv 81.6 plt 254; glucose 211; bun 35; creat 1.20; k+ 3.0; na++ 138; ca 9.8; liver normal albumin 3.5 mag 1.9; urine culture: <10,000; blood culture: no growth 05-25-20: wbc 14.2; hgb 20.8; hct 34.8; mcv 84.7 plt 212; glucose 109; bun 24; creat 0.86; k+ 3.4; na++ 137; ca 8.9; mag 1.9 05-28-20: wbc 10.9; hgb 11.8; hct 36.8; mcv 81.8 plt 227; glucose 132; bun 10; creat 0.82; k+ 3.0; na++ 137; ca 8.7 06-01-20: wbc 12.2; hgb 12.5; hct 40.3; mcv 81.9 plt 136; glucose 136; bun 12; creat 0.81; k+ 3.0; na++ 140; ca 8.9 mag 1.9 06-05-20: wbc 13.6; hgb 10.2; hct 32.9; mcv 93.5 plt 266 glucose 125; bun 35; creat 1.26; k+ 3.5; na++ 137; ca 8.9  06-07-20: wbc 11.9; hgb 9.6; hct 29.8; mcv  82.8 plt 318; glucose 114; bun 42; creat 1.47; k+ 3.9; na++ 138; ca 8.8 GFR 46; liver normal albumin 2.1  06-08-20: glucose 107; bun 57; creat 1.81; k+ 4.3; na++ 135; ca 8.9 GFR 36 06-09-20: glucose 107; bun 52; creat 1.64; k+ 5.3; na++ 136; ca 9.3;  GFR 40; liver normal albumin 2.3 06-10-20: wbc 12.6; hgb 10.5; hct 34.2; mcv 83.4 plt 417; glucose 126; bun 39; creat 4.4; na++ 137; ca 9.5 GFR 58; liver normal albumin 2.2 06-12-20: wbc 8.2; hgb 10.4; hct 33.6; mcv 81.2 plt 364; glucose 123; bun 24; creat 0.87; k+ 3.5; na++ 138; GFR >60; liver normal albumin 1.9 06-13-20: urine culture: <10,000 colonies 06-14-20: wbc 9.0; hgb 10.5; hct 33.8; mcv 80.5 plt 267; glucose 122; bun 20; creat 0.97; k+3.3; na++ 140; ca 9.0 GFR >60 06-29-20: wbc 10.1; hgb 10.7; hct 36.1; mcv 83.0 plt 294; glucose 152; bun 26; creat 1.02; k+ 4.4; na++ 138 ca 8.8 GFR>60   NO NEW LABS.   Review of Systems  Constitutional: Negative for malaise/fatigue.  Respiratory: Negative for cough and shortness of breath.   Cardiovascular: Negative for chest pain, palpitations and leg swelling.  Gastrointestinal: Negative for abdominal pain, constipation and heartburn.  Musculoskeletal: Negative for back pain, joint pain and myalgias.  Skin: Negative.   Neurological: Negative for dizziness.  Psychiatric/Behavioral: The patient is not nervous/anxious.    Physical Exam Constitutional:      General: He is not in acute distress.    Appearance: He is well-developed and well-nourished. He is not diaphoretic.  Neck:     Thyroid: No thyromegaly.  Cardiovascular:     Rate and Rhythm: Normal rate and regular rhythm.     Pulses: Normal pulses and intact distal pulses.     Heart sounds: Normal heart sounds.  Pulmonary:     Effort: Pulmonary effort is normal. No respiratory  distress.     Breath sounds: Normal breath sounds.  Abdominal:     General: Bowel sounds are normal. There is no distension.     Palpations: Abdomen is soft.     Tenderness:  There is no abdominal tenderness.  Musculoskeletal:        General: No edema. Normal range of motion.     Cervical back: Neck supple.     Right lower leg: No edema.     Left lower leg: No edema.     Comments: Is able to move all extremities  Status post right hip internal fixation 03-10-20         Lymphadenopathy:     Cervical: No cervical adenopathy.  Skin:    General: Skin is warm and dry.  Neurological:     Mental Status: He is alert and oriented to person, place, and time.  Psychiatric:        Mood and Affect: Mood and affect and mood normal.      TODAY  1. Dyslipidemia: is stable will continue fish oil daily and is off zocor  2. Chronic constipation: is stable will continue miralax daily and senna s as needed  3. Major depression recurrent chronic: is stable will continue effexor xr 225 mg daily and melatonin 10 mg nightly for sleep.   PREVIOUS   4. RLS: is stable will continue requip 0.25 mg nightly   5. Iron deficiency due to dietary intake: is stable hgb 10.5 will continue iron daily   6.  Interstitial lung disease: is stable uses 02 prn; had duoneb every 6 hours as needed  7. Dementia without behavioral disturbance unspecified dementia type: is stable will continue namenda 10 mg twice daily   8. Aortic atherosclerosis (CT 06-09-20) will monitor is on asa 81 mg daily   9. Protein calorie malnutrition, severe: is without change albumin 1.9 will continue prostat tid and supplements as directed will monitor   10. Ischemic colitis/biliary dyskinesia/calculus of gall bladder without cholecystitis without obstruction: is presently stable will continue bland diet will continue zofran 8 mg three times daily as needed;  Will contintue  gas x 125 mg four times daily   11. Closed displaced fracture of right femoral neck: is status post right internal fixation 03-10-20 will continue tylenol 1 gm three times daily   12. Essential hypertension: is stable b/p 164/78  (140/80  repeat)   will conitne lopressor 25 mg twice daily cardizem cd 360 mg daily lisinopril 40 mg daily  13. Gastroesophageal reflux disease without esophagitis: is stable will continue prilosec 20 mg daily  14. Prostate cancer/overactive bladder: is stable will continue urosatral 10 mg daily is not on oxybutrin due to history of fracture.   15.  hypokalemia: is stable k+ 4.4 is off supplement.      MD is aware of resident's narcotic use and is in agreement with current plan of care. We will attempt to wean resident as appropriate.  Ok Edwards NP [facility]  Contact [phone] Monday through Friday 8am- 5pm  After hours call [phone]

## 2020-07-25 LAB — PSA: Prostate Specific Ag, Serum: 16 ng/mL — ABNORMAL HIGH (ref 0.0–4.0)

## 2020-07-26 DIAGNOSIS — K529 Noninfective gastroenteritis and colitis, unspecified: Secondary | ICD-10-CM | POA: Diagnosis not present

## 2020-07-26 DIAGNOSIS — Z1159 Encounter for screening for other viral diseases: Secondary | ICD-10-CM | POA: Diagnosis not present

## 2020-07-27 DIAGNOSIS — F4323 Adjustment disorder with mixed anxiety and depressed mood: Secondary | ICD-10-CM | POA: Diagnosis not present

## 2020-07-28 DIAGNOSIS — F411 Generalized anxiety disorder: Secondary | ICD-10-CM | POA: Diagnosis not present

## 2020-07-28 DIAGNOSIS — F321 Major depressive disorder, single episode, moderate: Secondary | ICD-10-CM | POA: Diagnosis not present

## 2020-07-28 DIAGNOSIS — G3184 Mild cognitive impairment, so stated: Secondary | ICD-10-CM | POA: Diagnosis not present

## 2020-07-31 ENCOUNTER — Encounter: Payer: Self-pay | Admitting: Urology

## 2020-07-31 ENCOUNTER — Encounter: Payer: Self-pay | Admitting: Adult Health

## 2020-07-31 ENCOUNTER — Ambulatory Visit (INDEPENDENT_AMBULATORY_CARE_PROVIDER_SITE_OTHER): Payer: Medicare Other | Admitting: Urology

## 2020-07-31 ENCOUNTER — Non-Acute Institutional Stay (SKILLED_NURSING_FACILITY): Payer: Medicare Other | Admitting: Adult Health

## 2020-07-31 VITALS — BP 145/65 | HR 64 | Temp 97.7°F

## 2020-07-31 DIAGNOSIS — G2581 Restless legs syndrome: Secondary | ICD-10-CM

## 2020-07-31 DIAGNOSIS — J849 Interstitial pulmonary disease, unspecified: Secondary | ICD-10-CM | POA: Diagnosis not present

## 2020-07-31 DIAGNOSIS — N138 Other obstructive and reflux uropathy: Secondary | ICD-10-CM

## 2020-07-31 DIAGNOSIS — N401 Enlarged prostate with lower urinary tract symptoms: Secondary | ICD-10-CM | POA: Diagnosis not present

## 2020-07-31 DIAGNOSIS — D508 Other iron deficiency anemias: Secondary | ICD-10-CM | POA: Diagnosis not present

## 2020-07-31 DIAGNOSIS — C61 Malignant neoplasm of prostate: Secondary | ICD-10-CM

## 2020-07-31 DIAGNOSIS — R350 Frequency of micturition: Secondary | ICD-10-CM | POA: Diagnosis not present

## 2020-07-31 LAB — URINALYSIS, ROUTINE W REFLEX MICROSCOPIC
Bilirubin, UA: NEGATIVE
Glucose, UA: NEGATIVE
Ketones, UA: NEGATIVE
Leukocytes,UA: NEGATIVE
Nitrite, UA: NEGATIVE
RBC, UA: NEGATIVE
Specific Gravity, UA: 1.015 (ref 1.005–1.030)
Urobilinogen, Ur: 0.2 mg/dL (ref 0.2–1.0)
pH, UA: 7 (ref 5.0–7.5)

## 2020-07-31 LAB — BLADDER SCAN AMB NON-IMAGING: Scan Result: 100

## 2020-07-31 LAB — MICROSCOPIC EXAMINATION
Bacteria, UA: NONE SEEN
Epithelial Cells (non renal): NONE SEEN /hpf (ref 0–10)
RBC, Urine: NONE SEEN /hpf (ref 0–2)
Renal Epithel, UA: NONE SEEN /hpf
WBC, UA: NONE SEEN /hpf (ref 0–5)

## 2020-07-31 MED ORDER — MIRABEGRON ER 25 MG PO TB24: 25.0000 mg | ORAL_TABLET | Freq: Every day | ORAL | 11 refills | Status: DC

## 2020-07-31 NOTE — Progress Notes (Signed)
Location:  [facility] Room Number: 115 Place of Service:  SNF (31)   CODE STATUS: dnr  No Known Allergies  Chief Complaint  Patient presents with  . Medical Management of Chronic Issues       RLS:   Iron deficiency due to dietary intake:  Interstitial lung disease    HPI:  She is a 87 year old long term resident of this facility being seen for the management of her chronic illnesses:RLS:   Iron deficiency due to dietary intake:  Interstitial lung disease. He has been seen by urology today. He denies any uncontrolled pain; his RLS is managing his symptoms. No changes in appetite; no insomnia.    Past Medical History:  Diagnosis Date  . Anxiety   . Cancer [facility])    prostate  . Chronic chest wall pain   . Essential hypertension, benign 06/20/2016  . GAD (generalized anxiety disorder)   . GERD (gastroesophageal reflux disease)   . H/O echocardiogram 2016   normal  . Heart murmur    "leaking heart valve"  . High cholesterol 06/20/2016  . HOH (hard of hearing)    left  . Hx of falling   . Hyperlipidemia   . Interstitial pulmonary disease ([place])   . MDD (major depressive disorder)   . Murmur, cardiac   . Poor historian   . PUD (peptic ulcer disease)   . RLS (restless legs syndrome)   . Shortness of breath   . Wrist fracture 12/16/2011    Past Surgical History:  Procedure Laterality Date  . CHOLECYSTECTOMY N/A 06/28/2020   Procedure: LAPAROSCOPIC CHOLECYSTECTOMY;  Surgeon: [name], MD;  Location: [facility] ORS;  Service: General;  Laterality: N/A;  pt in [facility]  . COLONOSCOPY  2005   Rehman: Sigmoid colon diverticulosis, moderate sized external hemorrhoids. A few focal areas of mucosal erythema felt to be nonspecific.  Marland Kitchen ESOPHAGOGASTRODUODENOSCOPY    . ESOPHAGOGASTRODUODENOSCOPY N/A 07/18/2016   Rehman: normal exam except duodenal scar. h.pylori serologies negative  . FLEXIBLE SIGMOIDOSCOPY N/A 06/11/2020   Procedure: FLEXIBLE SIGMOIDOSCOPY;   Surgeon: Eloise Harman, DO;  Location: [facility] ENDO SUITE;  Service: Endoscopy;  Laterality: N/A;  . HIP PINNING,CANNULATED Right 03/10/2020   Procedure: INTERNAL FIXATION RIGHT HIP;  Surgeon: Carole Civil, MD;  Location: [facility] ORS;  Service: Orthopedics;  Laterality: Right;  . ORIF WRIST FRACTURE  12/19/2011   Procedure: OPEN REDUCTION INTERNAL FIXATION (ORIF) WRIST FRACTURE;  Surgeon: Carole Civil, MD;  Location: [facility] OR[facility];  Service: Orthopedics;  Laterality: Right;  . prostate cancer     Diagnosed in the 90s  . TONSILLECTOMY      Social History   Socioeconomic History  . Marital status: Widowed    Spouse name: Not on file  . Number of children: Not on file  . Years of education: Not on file  . Highest education level: Not on file  Occupational History  . Occupation: retired    Fish farm manager: RETIRED  Tobacco Use  . Smoking status: Never Smoker  . Smokeless tobacco: Never Used  Vaping Use  . Vaping Use: Never used  Substance and Sexual Activity  . Alcohol use: No  . Drug use: No  . Sexual activity: Not on file  Other Topics Concern  . Not on file  Social History Narrative  . Not on file   Social Determinants of Health   Financial Resource Strain: Not on file  Food Insecurity: Not on file  Transportation Needs: Not on file  Physical Activity: Not on file  Stress: Not on file  Social Connections: Not on file  Intimate Partner Violence: Not on file   Family History  Problem Relation Age of Onset  . Cancer Other   . Heart attack Mother   . Cancer Brother   . Heart attack Brother   . Heart disease Sister        by pass      VITAL SIGNS BP 131/74   Pulse 78   Temp 98.3 F (36.8 C)   Ht 5\' 9"  (1.753 m)   Wt 167 lb (75.8 kg)   BMI 24.66 kg/m   Outpatient Encounter Medications as of 07/31/2020  Medication Sig  . acetaminophen (TYLENOL) 500 MG tablet Take 1,000 mg by mouth in the morning, at noon, and at bedtime.  Marland Kitchen alfuzosin (UROXATRAL) 10 MG 24 hr tablet  Take 10 mg by mouth daily with breakfast.  . Amino Acids-Protein Hydrolys (PRO-STAT PO) Take 60 mLs by mouth 3 (three) times daily with meals.  . Ascorbic Acid (VITAMIN C) 1000 MG tablet Take 1,000 mg by mouth daily.  Marland Kitchen aspirin 81 MG chewable tablet Chew 1 tablet (81 mg total) by mouth daily with breakfast.  . Balsam [place]-Castor Oil (VENELEX) OINT Special Instructions: Apply to bilateral buttocks & sacral area qshift for blanchable erythema.  . calcium carbonate (TUMS - DOSED IN MG ELEMENTAL CALCIUM) 500 MG chewable tablet Chew 1 tablet by mouth 2 (two) times daily as needed for indigestion or heartburn.   . diltiazem (CARDIZEM CD) 360 MG 24 hr capsule Take 1 capsule (360 mg total) by mouth daily.  . ferrous sulfate 325 (65 FE) MG tablet Take 325 mg by mouth daily with breakfast. For Anemia and  Iron Deficiency  . Flax Oil-Fish Oil-Borage Oil (FISH OIL-FLAX OIL-BORAGE OIL) CAPS Take 1 capsule by mouth daily.  . hydrALAZINE (APRESOLINE) 50 MG tablet Take 1 tablet (50 mg total) by mouth 3 (three) times daily.  Marland Kitchen ipratropium-albuterol (DUONEB) 0.5-2.5 (3) MG/3ML SOLN Take 3 mLs by nebulization every 6 (six) hours as needed.  . isosorbide mononitrate (IMDUR) 30 MG 24 hr tablet Take 1 tablet (30 mg total) by mouth daily.  Marland Kitchen lisinopril (ZESTRIL) 40 MG tablet Take 1 tablet (40 mg total) by mouth daily.  Marland Kitchen loperamide (IMODIUM A-D) 2 MG tablet amt: 2mg ; oral Special Instructions: after each stool do not exceed more than 4tabs in 24 hours, As Needed  . Melatonin 10 MG TABS Take 10 mg by mouth at bedtime.  . memantine (NAMENDA) 10 MG tablet Take 10 mg by mouth 2 (two) times daily.  . metoprolol tartrate (LOPRESSOR) 25 MG tablet Take 1 tablet (25 mg total) by mouth 2 (two) times daily.  . mirabegron ER (MYRBETRIQ) 25 MG TB24 tablet Take 1 tablet (25 mg total) by mouth daily.  . NON FORMULARY Diet: Regular  . omeprazole (PRILOSEC) 20 MG capsule Take 1 capsule (20 mg total) by mouth daily before breakfast.  .  ondansetron (ZOFRAN-ODT) 8 MG disintegrating tablet Take 8 mg by mouth 3 (three) times daily as needed. For vomiting and nausea  . OXYGEN Inhale 2 L into the lungs continuous.  . polyethylene glycol (MIRALAX / GLYCOLAX) 17 g packet Take 17 g by mouth daily as needed.  Marland Kitchen rOPINIRole (REQUIP) 0.25 MG tablet Take 0.25 mg by mouth at bedtime.  . salicylic acid 17 % gel Apply 1 application topically in the morning and at bedtime. Apply to  warts to left hand  . senna-docusate (SENOKOT-S) 8.6-50 MG tablet Take 1 tablet by mouth at bedtime as needed for mild constipation.  . simethicone (MYLICON) 176 MG chewable tablet Chew 125 mg by mouth in the morning, at noon, in the evening, and at bedtime.  Marland Kitchen venlafaxine XR (EFFEXOR-XR) 150 MG 24 hr capsule Take 150 mg by mouth daily with breakfast. Along with 75 mg to = 225 mg  . venlafaxine XR (EFFEXOR-XR) 75 MG 24 hr capsule Take 75 mg by mouth daily with breakfast. Take along with 150 mg to = 225 mg   No facility-administered encounter medications on file as of 07/31/2020.     SIGNIFICANT DIAGNOSTIC EXAMS   PREVIOUS   03-09-20: right hip x-ray: Minimally displaced right femoral neck fracture.  03-09-20: chest x-ray: Peribronchial thickening with streaky perihilar infiltrates suggesting bronchitis or multifocal pneumonia.  03-12-20: renal ultrasound: No acute abnormality. Bilateral renal cysts  03-12-20: chest x-ray: Lower lung volumes. No acute cardiopulmonary abnormality.   05-22-20: chest x-ray:  1. Mild right basilar atelectasis. 2. Improved cardiomegaly from prior. Stable aortic atherosclerosis and tortuosity  05-22-20: ct of abdomen and pelvis:  Findings consistent with colitis involving the distal transverse colon, splenic flexure, and descending colon. This is likely ischemic or infectious in etiology. Diverticulosis is present but mostly distal to the area of thickening and diverticulitis is therefore less likely. 3 mm nonobstructing left renal  calculus. Small bladder diverticula.  05-26-20: ct angio of abdomen and pelvis:  1. Persistent colitis involving distal transverse colon, splenic flexure, and descending colon. 2. Mild lower abdominal ascites, slightly increased on a. 3. 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 The celiac axis and superior mesenteric artery remain widely patent however, which typically allows adequate collateral perfusion. 4. Increase in small bilateral pleural effusions and dependent atelectasis/consolidation in both lung bases since previous study. 5. Sigmoid diverticulosis. 6. 1 mm mid left ureteral calculus with stable left renal pelvicaliectasis. 7. Right nephrolithiasis. 8. Bilateral renal artery ostial stenosis of possible hemodynamic significance.  9. Aortic Atherosclerosis  06-05-20: ct of abdomen and pelvis:  Mild colonic distension with air-fluid levels and segmental wall thickening likely reflects colitis. Additional stable findings detailed above.  06-09-20: acute abdomen:  Dilated and air-filled colon is noted suggesting possible ileus. No acute cardiopulmonary disease  06-09-20: ct of abdomen and pelvis:  Cholelithiasis. Haziness noted around the gallbladder. Cannot exclude acute cholecystitis. Continued wall thickening in the left colon from the distal transverse colon through the distal descending colon concerning for colitis. Left colonic diverticulosis. Small bilateral pleural effusions, increasing since prior study. Bibasilar atelectasis. Coronary artery disease, aortic atherosclerosis.  06-09-20: abdominal ultrasound:  1. Moderate gallbladder sludge with probable punctate stones. Slight increased wall thickness with small amount of fluid adjacent to the gallbladder but negative sonographic Murphy. Findings are suggestive of but not definitive for cholecystitis; consider correlation with nuclear medicine hepatobiliary imaging. 6 mm gallbladder polyp. 2. Simple  appearing renal cysts. 3. Incidental note made of pleural effusions.  06-12-20: HIDA:  1. No scintigraphic evidence of acute cholecystitis. 2. Findings compatible with severe biliary dyskinesia with little to no gallbladder emptying and elicitation of abdominal pain with the CCK administration.  06-13-20: chest x-ray:  Diffuse interstitial opacity with Awanda Mink lines with possible trace pleural fluid. Stable heart size and aortic contours. Negative for air leak  NO NEW EXAMS.      LABS REVIEWED PREVIOUS    03-09-20: wbc 13.7; hgb 12.1; hct 37.7; mcv 80.4 plt 205;  glucose 208; bun 21; creat 1.50; k+ 3.2; na++ 141; ca 9.2; hgb a1c 5.9; HIV: nr 03-10-20: wbc 11.2; hgb 11.3; hct 35.9; mcv 82.7 plt 188; glucose 160; bun 18; creat 1.21; k+ 3.3; na++ 139; ca 8.8 liver normal 3.0; mag 2.3 03-12-20: wbc 10.4; hgb 9.3; hct 29.6; mcv 84.3 plt 178; glucose 138; bun 34; creat 1.77; k+ 3.8; na++ 138; ca 8.3 03-14-20: glucose 134; bun 36; creat 1.23; k+ 3.2; na++ 139; ca 8.8 mag 2.1   03-16-20: hgb 10.7; hct 33.4 glucose 127; bun 18; creat 0.86 k+ 3.3; na++ 139; ca 9.2 ;liver normal albumin 2.5 iron 23; tibc 221; ferritin 137 03-23-20: k+ 3.0  03-27-20: glucose 133; bun 34; creat 0.85; k+ 3.6; na++ 142; ca 9.0; AM cortisol: 10.6  05-22-20: wbc 19.4; hgb 14.6; hct 43.8; mcv 81.6 plt 254; glucose 211; bun 35; creat 1.20; k+ 3.0; na++ 138; ca 9.8; liver normal albumin 3.5 mag 1.9; urine culture: <10,000; blood culture: no growth 05-25-20: wbc 14.2; hgb 20.8; hct 34.8; mcv 84.7 plt 212; glucose 109; bun 24; creat 0.86; k+ 3.4; na++ 137; ca 8.9; mag 1.9 05-28-20: wbc 10.9; hgb 11.8; hct 36.8; mcv 81.8 plt 227; glucose 132; bun 10; creat 0.82; k+ 3.0; na++ 137; ca 8.7 06-01-20: wbc 12.2; hgb 12.5; hct 40.3; mcv 81.9 plt 136; glucose 136; bun 12; creat 0.81; k+ 3.0; na++ 140; ca 8.9 mag 1.9 06-05-20: wbc 13.6; hgb 10.2; hct 32.9; mcv 93.5 plt 266 glucose 125; bun 35; creat 1.26; k+ 3.5; na++ 137; ca 8.9  06-07-20: wbc  11.9; hgb 9.6; hct 29.8; mcv 82.8 plt 318; glucose 114; bun 42; creat 1.47; k+ 3.9; na++ 138; ca 8.8 GFR 46; liver normal albumin 2.1  06-08-20: glucose 107; bun 57; creat 1.81; k+ 4.3; na++ 135; ca 8.9 GFR 36 06-09-20: glucose 107; bun 52; creat 1.64; k+ 5.3; na++ 136; ca 9.3;  GFR 40; liver normal albumin 2.3 06-10-20: wbc 12.6; hgb 10.5; hct 34.2; mcv 83.4 plt 417; glucose 126; bun 39; creat 4.4; na++ 137; ca 9.5 GFR 58; liver normal albumin 2.2 06-12-20: wbc 8.2; hgb 10.4; hct 33.6; mcv 81.2 plt 364; glucose 123; bun 24; creat 0.87; k+ 3.5; na++ 138; GFR >60; liver normal albumin 1.9 06-13-20: urine culture: <10,000 colonies 06-14-20: wbc 9.0; hgb 10.5; hct 33.8; mcv 80.5 plt 267; glucose 122; bun 20; creat 0.97; k+3.3; na++ 140; ca 9.0 GFR >60 06-29-20: wbc 10.1; hgb 10.7; hct 36.1; mcv 83.0 plt 294; glucose 152; bun 26; creat 1.02; k+ 4.4; na++ 138 ca 8.8 GFR>60  TODAY  07-24-20 PSA 16.0  Review of Systems  Constitutional: Negative for malaise/fatigue.  Respiratory: Negative for cough and shortness of breath.   Cardiovascular: Negative for chest pain, palpitations and leg swelling.  Gastrointestinal: Negative for abdominal pain, constipation and heartburn.  Musculoskeletal: Negative for back pain, joint pain and myalgias.  Skin: Negative.   Neurological: Negative for dizziness.  Psychiatric/Behavioral: The patient is not nervous/anxious.      Physical Exam Constitutional:      General: He is not in acute distress.    Appearance: He is well-developed and well-nourished. He is not diaphoretic.  Neck:     Thyroid: No thyromegaly.  Cardiovascular:     Rate and Rhythm: Normal rate and regular rhythm.     Pulses: Normal pulses and intact distal pulses.     Heart sounds: Normal heart sounds.  Pulmonary:     Effort: Pulmonary effort is normal. No respiratory distress.  Breath sounds: Normal breath sounds.  Abdominal:     General: Bowel sounds are normal. There is no distension.      Palpations: Abdomen is soft.     Tenderness: There is no abdominal tenderness.  Musculoskeletal:        General: No edema. Normal range of motion.     Cervical back: Neck supple.     Right lower leg: No edema.     Left lower leg: No edema.     Comments: Is able to move all extremities  Status post right hip internal fixation 03-10-20     Lymphadenopathy:     Cervical: No cervical adenopathy.  Skin:    General: Skin is warm and dry.  Neurological:     Mental Status: He is alert and oriented to person, place, and time.  Psychiatric:        Mood and Affect: Mood and affect and mood normal.      SIGNIFICANT DIAGNOSTIC EXAMS  TODAY  1. RLS: is stable will continue requip 0.25 mg nightly   2. Iron deficiency due to dietary intake: is stable hgb 10.5 will continue iron daily  3. Interstitial lung disease: is stable has prn 02; and duoneb every 6 hours as needed.   PREVIOUS   4. Dementia without behavioral disturbance unspecified dementia type: is stable weight is 167 pounds  will continue namenda 10 mg twice daily   5. Aortic atherosclerosis (CT 06-09-20) will monitor is on asa 81 mg daily   6. Protein calorie malnutrition, severe: is without change albumin 1.9 will continue prostat tid and supplements as directed will monitor   7. Ischemic colitis/biliary dyskinesia/calculus of gall bladder without cholecystitis without obstruction: is presently stable will continue bland diet will continue zofran 8 mg three times daily as needed;  Will contintue  gas x 125 mg four times daily   8. Closed displaced fracture of right femoral neck: is status post right internal fixation 03-10-20 will continue tylenol 1 gm three times daily   9. Essential hypertension: is stable b/p 131/74   will conitne lopressor 25 mg twice daily cardizem cd 360 mg daily lisinopril 40 mg daily  10. Gastroesophageal reflux disease without esophagitis: is stable will continue prilosec 20 mg daily  11. Prostate  cancer/overactive bladder: is stable will continue urosatral 10 mg daily is not on oxybutrin due to history of fracture. Has seen urology today will begin mybetric 25 mg daily   12.  hypokalemia: is stable k+ 4.4 is off supplement.   13. Dyslipidemia: is stable will continue fish oil daily and is off zocor  14. Chronic constipation: is stable will continue miralax daily and senna s as needed  15. Major depression recurrent chronic: is stable will continue effexor xr 225 mg daily and melatonin 10 mg nightly for sleep.        MD is aware of resident's narcotic use and is in agreement with current plan of care. We will attempt to wean resident as appropriate.  Ok Edwards NP [facility]  Contact [phone] Monday through Friday 8am- 5pm  After hours call [phone]

## 2020-07-31 NOTE — Progress Notes (Signed)
Urological Symptom Review  Patient is experiencing the following symptoms: none    Review of Systems  Gastrointestinal (upper)  : Nausea  Gastrointestinal (lower) : Negative for lower GI symptoms  Constitutional : Fever Night Sweats Weight loss Fatigue  Skin: Negative for skin symptoms  Eyes: Negative for eye symptoms  Ear/Nose/Throat : Negative for Ear/Nose/Throat symptoms  Hematologic/Lymphatic: Negative for Hematologic/Lymphatic symptoms  Cardiovascular : Negative for cardiovascular symptoms  Respiratory : Negative for respiratory symptoms  Endocrine: Negative for endocrine symptoms  Musculoskeletal: Negative for musculoskeletal symptoms  Neurological: Headaches Dizziness  Psychologic: Negative for psychiatric symptoms

## 2020-07-31 NOTE — Progress Notes (Signed)
[facility] [place]   07/31/2020 11:06 AM   Jeffrey Sherman 20-Nov-1929 [id number]  Referring provider: Celene Squibb, MD [address] [name],  [name] [address]  No chief complaint on file.   HPI:  Chief Complaint: PCa and BPH w/LUTS  History of Present Illness:   F/u -   1) BPH with LUTS - urinary frequency despite taking both alfuzosin and terazosin. Off alfuzosin due to dizziness. He does c/o about his urinary frequency.  Pt denies any recent UTIs, significant urinary incontinence, gross hematuria, or blood in his stool. AUASS is 15. Prostate was about 40 grams on CT in 2021. PVR = 100 ml.   2) PCa - dx in 2002 with Dr. Serita Butcher with PSA 22.7, Gleason 3+4=7, 3+3=6. No definitive therapy. On ADT until 2013 when he started IADT. He stopped ADT after a 2015 dose and PSA rose to 28.7 in March 2021 and 24 in May 2021. He received a 4 month Lupron in May 2021 and PSA did not respond - it remained stable at 24.4 in Aug 2021 with T  32. He was adamant about not restarting ADT and hasnt had any further. His PSA has declined. His current PSA is lower at 16 in Feb 2022.   Staging: 5.28.2019: Bone/CT scan both showed no evidence of metastatic disease.  06/09/2020 CT no evidence of mets  - I reviewed images   Today he is seen for the above.   PMH: Past Medical History:  Diagnosis Date  . Anxiety   . Cancer [facility])    prostate  . Chronic chest wall pain   . Essential hypertension, benign 06/20/2016  . GAD (generalized anxiety disorder)   . GERD (gastroesophageal reflux disease)   . H/O echocardiogram 2016   normal  . Heart murmur    "leaking heart valve"  . High cholesterol 06/20/2016  . HOH (hard of hearing)    left  . Hx of falling   . Hyperlipidemia   . Interstitial pulmonary disease ([place])   . MDD (major depressive disorder)   . Murmur, cardiac   . Poor historian   . PUD (peptic ulcer disease)   . RLS (restless legs syndrome)   . Shortness of breath   . Wrist  fracture 12/16/2011    Surgical History: Past Surgical History:  Procedure Laterality Date  . CHOLECYSTECTOMY N/A 06/28/2020   Procedure: LAPAROSCOPIC CHOLECYSTECTOMY;  Surgeon: [name], MD;  Location: [facility] ORS;  Service: General;  Laterality: N/A;  pt in [facility]  . COLONOSCOPY  2005   Rehman: Sigmoid colon diverticulosis, moderate sized external hemorrhoids. A few focal areas of mucosal erythema felt to be nonspecific.  Marland Kitchen ESOPHAGOGASTRODUODENOSCOPY    . ESOPHAGOGASTRODUODENOSCOPY N/A 07/18/2016   Rehman: normal exam except duodenal scar. h.pylori serologies negative  . FLEXIBLE SIGMOIDOSCOPY N/A 06/11/2020   Procedure: FLEXIBLE SIGMOIDOSCOPY;  Surgeon: Eloise Harman, DO;  Location: [facility] SUITE;  Service: Endoscopy;  Laterality: N/A;  . HIP PINNING,CANNULATED Right 03/10/2020   Procedure: INTERNAL FIXATION RIGHT HIP;  Surgeon: Carole Civil, MD;  Location: [facility] ORS;  Service: Orthopedics;  Laterality: Right;  . ORIF WRIST FRACTURE  12/19/2011   Procedure: OPEN REDUCTION INTERNAL FIXATION (ORIF) WRIST FRACTURE;  Surgeon: Carole Civil, MD;  Location: [facility];  Service: Orthopedics;  Laterality: Right;  . prostate cancer     Diagnosed in the 90s  . TONSILLECTOMY      Home Medications:  Allergies as of 07/31/2020  No Known Allergies     Medication List    Notice   This visit is during an admission. Changes to the med list made in this visit will be reflected in the After Visit Summary of the admission.     Allergies: No Known Allergies  Family History: Family History  Problem Relation Age of Onset  . Cancer Other   . Heart attack Mother   . Cancer Brother   . Heart attack Brother   . Heart disease Sister        by pass    Social History:  reports that he has never smoked. He has never used smokeless tobacco. He reports that he does not drink alcohol and does not use drugs.   Physical Exam: BP (!) 145/65   Pulse 64   Temp 97.7 F (36.5 C)    Constitutional:  Alert and oriented, No acute distress. HEENT: [place] AT, moist mucus membranes.  Trachea midline, no masses. Cardiovascular: No clubbing, cyanosis, or edema. Respiratory: Normal respiratory effort, no increased work of breathing. GI: Abdomen is soft, nontender, nondistended, no abdominal masses GU: No CVA tenderness Lymph: No cervical or inguinal lymphadenopathy. Skin: No rashes, bruises or suspicious lesions. Neurologic: Grossly intact, no focal deficits, moving all 4 extremities. Psychiatric: Normal mood and affect.  Laboratory Data: Lab Results  Component Value Date   WBC 10.1 06/29/2020   HGB 10.7 (L) 06/29/2020   HCT 36.1 (L) 06/29/2020   MCV 83.0 06/29/2020   PLT 294 06/29/2020    Lab Results  Component Value Date   CREATININE 1.02 06/29/2020    Lab Results  Component Value Date   PSA 24.0 (H) 10/19/2019   PSA 28.7 (H) 08/25/2019    Lab Results  Component Value Date   TESTOSTERONE 32 (L) 02/07/2020    Lab Results  Component Value Date   HGBA1C 5.9 (H) 03/09/2020    Urinalysis    Component Value Date/Time   COLORURINE YELLOW 06/13/2020 2215   APPEARANCEUR HAZY (A) 06/13/2020 2215   APPEARANCEUR Clear 02/15/2020 1321   LABSPEC 1.014 06/13/2020 2215   PHURINE 5.0 06/13/2020 [place] 06/13/2020 2215   HGBUR NEGATIVE 06/13/2020 2215   BILIRUBINUR NEGATIVE 06/13/2020 2215   BILIRUBINUR Negative 02/15/2020 1321   [place] 06/13/2020 2215   PROTEINUR 30 (A) 06/13/2020 2215   UROBILINOGEN negative (A) 10/19/2019 0958   UROBILINOGEN 0.2 05/08/2014 2045   NITRITE NEGATIVE 06/13/2020 2215   LEUKOCYTESUR NEGATIVE 06/13/2020 2215    Lab Results  Component Value Date   LABMICR See below: 02/15/2020   WBCUA None seen 02/15/2020   LABEPIT None seen 02/15/2020   MUCUS Present 02/15/2020   BACTERIA NONE SEEN 06/13/2020    Pertinent Imaging: CT Results for orders placed in visit on 10/09/00  DG Abd 1  View  Narrative FINDINGS CLINICAL DATA:  87 YEAR OLD MALE WITH RENAL CALCULI, ABDOMINAL PAIN. SINGLE VIEW ABDOMEN - 10/09/00: FINDINGS:  A SINGLE FRONTAL VIEW OF THE ABDOMEN AND PELVIS DEMONSTRATES UNREMARKABLE BOWEL GAS PATTERN.  DEFINITE CALCIFICATIONS ARE NOTED OVERLYING THE RENAL SHADOWS OR EXPECTED COURSE OF THE URETERS BUT PLEASE NOTE THAT STOOL IN THE COLONIC GAS DECREASE SENSITIVITY, ESPECIALLY OVER THE RENAL SHADOWS.   MINIMAL DEGENERATIVE CHANGE IN THE LOWER LUMBAR SPINE ARE NOTED. IMPRESSION NO DEFINITE EVIDENCE OF CALCIFICATIONS OVERLYING THE ABDOMEN/PELVIS, BUT SOMEWHAT LIMITED SENSITIVITY OVER THE RENAL SHADOWS SECONDARY TO OVERLYING COLONIC BOWEL GAS/STOOL.  No results found for this or any previous visit.  No results  found for this or any previous visit.  No results found for this or any previous visit.  Results for orders placed during the hospital encounter of 03/09/20  US RENAL  Narrative CLINICAL DATA:  Acute kidney injury  EXAM: RENAL / URINARY TRACT ULTRASOUND COMPLETE  COMPARISON:  Abdominal ultrasound 04/21/2019  FINDINGS: Right Kidney:  Renal measurements: 12.5 x 5.0 x 4.3 cm = volume: 141 mL. Echogenicity within normal limits. Multiple cysts, largest measuring 6.6 cm. No solid mass. No hydronephrosis.  Left Kidney:  Renal measurements: 11.8 x 5.6 x 5.5 cm = volume: 189 mL. Echogenicity within normal limits. Large cyst measuring 6.1 cm. No solid mass. No hydronephrosis.  Bladder:  Decompressed.  Other:  None.  IMPRESSION: No acute abnormality.  Bilateral renal cysts.   Electronically Signed By: Audie Pinto M.D. On: 03/12/2020 12:06  No results found for this or any previous visit.  No results found for this or any previous visit.  No results found for this or any previous visit.   Assessment & Plan:    1. Frequency of micturition, BPH  - We discussed the nature, r/b of adding 5ari, or anticholinergic or Myrbetriq. He  elects trial of Myrbetriq.   - Urinalysis, Routine w reflex microscopic - BLADDER SCAN AMB NON-IMAGING  2) Prostate cancer - fortunately his PSA is lower and stable and imaging 2021 with no mets. Continue to monitor.   No follow-ups on file.  Festus Aloe, MD

## 2020-07-31 NOTE — Patient Instructions (Signed)
Mirabegron Extended-release Oral Tablets What is this medicine? MIRABEGRON (MIR a BEG ron) is used to treat overactive bladder. This medicine reduces the amount of bathroom visits. It may also help to control wetting accidents. This medicine may be used for other purposes; ask your health care provider or pharmacist if you have questions. COMMON BRAND NAME(S): Myrbetriq What should I tell my health care provider before I take this medicine? They need to know if you have any of these conditions:  high blood pressure  kidney disease  liver disease  problems urinating  prostate disease  an unusual or allergic reaction to mirabegron, other medicines, foods, dyes, or preservatives  pregnant or trying to get pregnant  breast-feeding How should I use this medicine? Take this medicine by mouth with water. Take it as directed on the prescription label at the same time every day. Do not cut, crush or chew this medicine. Swallow the tablets whole. Adults can take it with or without food. Children should take it with food. Keep taking it unless your health care provider tells you to stop. Talk to your health care provider about the use of this medicine in children. While it may be prescribed for children as young as 3 years for selected conditions, precautions do apply. Overdosage: If you think you have taken too much of this medicine contact a poison control center or emergency room at once. NOTE: This medicine is only for you. Do not share this medicine with others. What if I miss a dose? If you miss a dose, take it as soon as you can unless it is more than 12 hours late. If it is more than 12 hours late, skip the missed dose. Take the next dose at the normal time. What may interact with this medicine?  codeine  desipramine  digoxin  flecainide  MAOIs like Carbex, Eldepryl, Marplan, Nardil, and  Parnate  methadone  metoprolol  pimozide  propafenone  thioridazine  warfarin This list may not describe all possible interactions. Give your health care provider a list of all the medicines, herbs, non-prescription drugs, or dietary supplements you use. Also tell them if you smoke, drink alcohol, or use illegal drugs. Some items may interact with your medicine. What should I watch for while using this medicine? Visit your health care provider for regular checks on your progress. Tell your health care provider if your symptoms do not start to get better or if they get worse. Check your blood pressure as directed. Ask your doctor or health care professional what your blood pressure should be and when you should contact him or her. What side effects may I notice from receiving this medicine? Side effects that you should report to your doctor or health care professional as soon as possible:  allergic reactions (skin rash, itching or hives; swelling of the face, lips, or tongue)  increase in blood pressure  fast, irregular heartbeat  infection (fever, chills, pain or trouble passing urine)  trouble passing urine Side effects that usually do not require medical attention (report these to your doctor or health care professional if they continue or are bothersome):  constipation  dry mouth  headache  nausea  sore throat This list may not describe all possible side effects. Call your doctor for medical advice about side effects. You may report side effects to FDA at 1-800-FDA-1088. Where should I keep my medicine? Keep out of the reach of children and pets. Store at room temperature between 20 and 25 degrees C (  68 and 77 degrees F). Get rid of any unused medicine after the expiration date. To get rid of medicines that are no longer needed or have expired:  Take the medicine to a medicine take-back program. Check with your pharmacy or law enforcement to find a location.  If you  cannot return the medicine, check the label or package insert to see if the medicine should be thrown out in the garbage or flushed down the toilet. If you are not sure, ask your health care provider. If it is safe to put it in the trash, empty the medicine out of the container. Mix the medicine with cat litter, dirt, coffee grounds, or other unwanted substance. Seal the mixture in a bag or container. Put it in the trash. NOTE: This sheet is a summary. It may not cover all possible information. If you have questions about this medicine, talk to your doctor, pharmacist, or health care provider.  2021 Elsevier/Gold Standard (2020-02-25 09:56:57)

## 2020-08-03 ENCOUNTER — Non-Acute Institutional Stay (SKILLED_NURSING_FACILITY): Payer: Medicare Other | Admitting: Adult Health

## 2020-08-03 ENCOUNTER — Encounter: Payer: Self-pay | Admitting: Adult Health

## 2020-08-03 DIAGNOSIS — F0390 Unspecified dementia without behavioral disturbance: Secondary | ICD-10-CM

## 2020-08-03 DIAGNOSIS — F339 Major depressive disorder, recurrent, unspecified: Secondary | ICD-10-CM

## 2020-08-03 DIAGNOSIS — F4323 Adjustment disorder with mixed anxiety and depressed mood: Secondary | ICD-10-CM | POA: Diagnosis not present

## 2020-08-03 DIAGNOSIS — J849 Interstitial pulmonary disease, unspecified: Secondary | ICD-10-CM

## 2020-08-03 NOTE — Progress Notes (Signed)
Location:  [facility] Room Number: 156/D Place of Service:  SNF (31)   CODE STATUS: DNR  No Known Allergies  Chief Complaint  Patient presents with  . Acute Visit    Care Plan Meeting     HPI:  We have come together for his care plan meeting.  BIMS 15/15 mood 5/30. He is independent to srupervision with her adls. He does feed himself. He is occasionally incontinent of bladder and bowel. He is able to ambulate. there are no reports of pain present. Weight is stable at 167 pounds  His status is improving overall. He continues to be followed for his chronic illnesses including: Interstitial lung disease   Dementia without behavioral disturbance unspecified dementia type  Major depression recurrent chronic  Past Medical History:  Diagnosis Date  . Anxiety   . Cancer [facility])    prostate  . Chronic chest wall pain   . Essential hypertension, benign 06/20/2016  . GAD (generalized anxiety disorder)   . GERD (gastroesophageal reflux disease)   . H/O echocardiogram 2016   normal  . Heart murmur    "leaking heart valve"  . High cholesterol 06/20/2016  . HOH (hard of hearing)    left  . Hx of falling   . Hyperlipidemia   . Interstitial pulmonary disease ([place])   . MDD (major depressive disorder)   . Murmur, cardiac   . Poor historian   . PUD (peptic ulcer disease)   . RLS (restless legs syndrome)   . Shortness of breath   . Wrist fracture 12/16/2011    Past Surgical History:  Procedure Laterality Date  . CHOLECYSTECTOMY N/A 06/28/2020   Procedure: LAPAROSCOPIC CHOLECYSTECTOMY;  Surgeon: [name], MD;  Location: [facility] ORS;  Service: General;  Laterality: N/A;  pt in [facility]  . COLONOSCOPY  2005   Rehman: Sigmoid colon diverticulosis, moderate sized external hemorrhoids. A few focal areas of mucosal erythema felt to be nonspecific.  Marland Kitchen ESOPHAGOGASTRODUODENOSCOPY    . ESOPHAGOGASTRODUODENOSCOPY N/A 07/18/2016   Rehman: normal exam except duodenal scar.  h.pylori serologies negative  . FLEXIBLE SIGMOIDOSCOPY N/A 06/11/2020   Procedure: FLEXIBLE SIGMOIDOSCOPY;  Surgeon: Eloise Harman, DO;  Location: [facility] ENDO SUITE;  Service: Endoscopy;  Laterality: N/A;  . HIP PINNING,CANNULATED Right 03/10/2020   Procedure: INTERNAL FIXATION RIGHT HIP;  Surgeon: Carole Civil, MD;  Location: [facility] ORS;  Service: Orthopedics;  Laterality: Right;  . ORIF WRIST FRACTURE  12/19/2011   Procedure: OPEN REDUCTION INTERNAL FIXATION (ORIF) WRIST FRACTURE;  Surgeon: Carole Civil, MD;  Location: [facility] ORS;  Service: Orthopedics;  Laterality: Right;  . prostate cancer     Diagnosed in the 90s  . TONSILLECTOMY      Social History   Socioeconomic History  . Marital status: Widowed    Spouse name: Not on file  . Number of children: Not on file  . Years of education: Not on file  . Highest education level: Not on file  Occupational History  . Occupation: retired    Fish farm manager: RETIRED  Tobacco Use  . Smoking status: Never Smoker  . Smokeless tobacco: Never Used  Vaping Use  . Vaping Use: Never used  Substance and Sexual Activity  . Alcohol use: No  . Drug use: No  . Sexual activity: Not on file  Other Topics Concern  . Not on file  Social History Narrative  . Not on file   Social Determinants of Health   Financial Resource Strain:  Not on file  Food Insecurity: Not on file  Transportation Needs: Not on file  Physical Activity: Not on file  Stress: Not on file  Social Connections: Not on file  Intimate Partner Violence: Not on file   Family History  Problem Relation Age of Onset  . Cancer Other   . Heart attack Mother   . Cancer Brother   . Heart attack Brother   . Heart disease Sister        by pass      VITAL SIGNS BP (!) 141/73   Pulse 80   Temp 98.5 F (36.9 C)   Resp 20   Ht 5\' 9"  (1.753 m)   Wt 167 lb (75.8 kg)   SpO2 96%   BMI 24.66 kg/m   Outpatient Encounter Medications as of 08/03/2020  Medication Sig  .  acetaminophen (TYLENOL) 500 MG tablet Take 1,000 mg by mouth in the morning, at noon, and at bedtime.  Marland Kitchen alfuzosin (UROXATRAL) 10 MG 24 hr tablet Take 10 mg by mouth daily with breakfast.  . Amino Acids-Protein Hydrolys (PRO-STAT PO) Take 60 mLs by mouth 3 (three) times daily with meals.  . Ascorbic Acid (VITAMIN C) 1000 MG tablet Take 1,000 mg by mouth daily.  Marland Kitchen aspirin 81 MG chewable tablet Chew 1 tablet (81 mg total) by mouth daily with breakfast.  . Balsam [place]-Castor Oil (VENELEX) OINT Special Instructions: Apply to bilateral buttocks & sacral area qshift for blanchable erythema.  . calcium carbonate (TUMS - DOSED IN MG ELEMENTAL CALCIUM) 500 MG chewable tablet Chew 1 tablet by mouth 2 (two) times daily as needed for indigestion or heartburn.   . diltiazem (CARDIZEM CD) 360 MG 24 hr capsule Take 1 capsule (360 mg total) by mouth daily.  . ferrous sulfate 325 (65 FE) MG tablet Take 325 mg by mouth daily with breakfast. For Anemia and  Iron Deficiency  . Flax Oil-Fish Oil-Borage Oil (FISH OIL-FLAX OIL-BORAGE OIL) CAPS Take 1 capsule by mouth daily.  . hydrALAZINE (APRESOLINE) 50 MG tablet Take 1 tablet (50 mg total) by mouth 3 (three) times daily.  Marland Kitchen ipratropium-albuterol (DUONEB) 0.5-2.5 (3) MG/3ML SOLN Take 3 mLs by nebulization every 6 (six) hours as needed.  . isosorbide mononitrate (IMDUR) 30 MG 24 hr tablet Take 1 tablet (30 mg total) by mouth daily.  Marland Kitchen lisinopril (ZESTRIL) 40 MG tablet Take 1 tablet (40 mg total) by mouth daily.  Marland Kitchen loperamide (IMODIUM A-D) 2 MG tablet amt: 2mg ; oral Special Instructions: after each stool do not exceed more than 4tabs in 24 hours, As Needed  . Melatonin 10 MG TABS Take 10 mg by mouth at bedtime.  . memantine (NAMENDA) 10 MG tablet Take 10 mg by mouth 2 (two) times daily.  . metoprolol tartrate (LOPRESSOR) 25 MG tablet Take 1 tablet (25 mg total) by mouth 2 (two) times daily.  . mirabegron ER (MYRBETRIQ) 25 MG TB24 tablet Take 1 tablet (25 mg total) by  mouth daily.  . NON FORMULARY Diet: Regular  . omeprazole (PRILOSEC) 20 MG capsule Take 1 capsule (20 mg total) by mouth daily before breakfast.  . ondansetron (ZOFRAN-ODT) 8 MG disintegrating tablet Take 8 mg by mouth 3 (three) times daily as needed. For vomiting and nausea  . OXYGEN Inhale 2 L into the lungs continuous.  . polyethylene glycol (MIRALAX / GLYCOLAX) 17 g packet Take 17 g by mouth daily as needed.  Marland Kitchen rOPINIRole (REQUIP) 0.25 MG tablet Take 0.25 mg by mouth at bedtime.  Marland Kitchen  salicylic acid 17 % gel Apply 1 application topically in the morning and at bedtime. Apply to warts to left hand  . senna-docusate (SENOKOT-S) 8.6-50 MG tablet Take 1 tablet by mouth at bedtime as needed for mild constipation.  . simethicone (MYLICON) 254 MG chewable tablet Chew 125 mg by mouth in the morning, at noon, in the evening, and at bedtime.  Marland Kitchen venlafaxine XR (EFFEXOR-XR) 150 MG 24 hr capsule Take 150 mg by mouth daily with breakfast. Along with 75 mg to = 225 mg  . venlafaxine XR (EFFEXOR-XR) 75 MG 24 hr capsule Take 75 mg by mouth daily with breakfast. Take along with 150 mg to = 225 mg   No facility-administered encounter medications on file as of 08/03/2020.     SIGNIFICANT DIAGNOSTIC EXAMS   PREVIOUS   03-09-20: right hip x-ray: Minimally displaced right femoral neck fracture.  03-09-20: chest x-ray: Peribronchial thickening with streaky perihilar infiltrates suggesting bronchitis or multifocal pneumonia.  03-12-20: renal ultrasound: No acute abnormality. Bilateral renal cysts  03-12-20: chest x-ray: Lower lung volumes. No acute cardiopulmonary abnormality.   05-22-20: chest x-ray:  1. Mild right basilar atelectasis. 2. Improved cardiomegaly from prior. Stable aortic atherosclerosis and tortuosity  05-22-20: ct of abdomen and pelvis:  Findings consistent with colitis involving the distal transverse colon, splenic flexure, and descending colon. This is likely ischemic or infectious in  etiology. Diverticulosis is present but mostly distal to the area of thickening and diverticulitis is therefore less likely. 3 mm nonobstructing left renal calculus. Small bladder diverticula.  05-26-20: ct angio of abdomen and pelvis:  1. Persistent colitis involving distal transverse colon, splenic flexure, and descending colon. 2. Mild lower abdominal ascites, slightly increased on a. 3. 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 The celiac axis and superior mesenteric artery remain widely patent however, which typically allows adequate collateral perfusion. 4. Increase in small bilateral pleural effusions and dependent atelectasis/consolidation in both lung bases since previous study. 5. Sigmoid diverticulosis. 6. 1 mm mid left ureteral calculus with stable left renal pelvicaliectasis. 7. Right nephrolithiasis. 8. Bilateral renal artery ostial stenosis of possible hemodynamic significance.  9. Aortic Atherosclerosis  06-05-20: ct of abdomen and pelvis:  Mild colonic distension with air-fluid levels and segmental wall thickening likely reflects colitis. Additional stable findings detailed above.  06-09-20: acute abdomen:  Dilated and air-filled colon is noted suggesting possible ileus. No acute cardiopulmonary disease  06-09-20: ct of abdomen and pelvis:  Cholelithiasis. Haziness noted around the gallbladder. Cannot exclude acute cholecystitis. Continued wall thickening in the left colon from the distal transverse colon through the distal descending colon concerning for colitis. Left colonic diverticulosis. Small bilateral pleural effusions, increasing since prior study. Bibasilar atelectasis. Coronary artery disease, aortic atherosclerosis.  06-09-20: abdominal ultrasound:  1. Moderate gallbladder sludge with probable punctate stones. Slight increased wall thickness with small amount of fluid adjacent to the gallbladder but negative sonographic Murphy. Findings are  suggestive of but not definitive for cholecystitis; consider correlation with nuclear medicine hepatobiliary imaging. 6 mm gallbladder polyp. 2. Simple appearing renal cysts. 3. Incidental note made of pleural effusions.  06-12-20: HIDA:  1. No scintigraphic evidence of acute cholecystitis. 2. Findings compatible with severe biliary dyskinesia with little to no gallbladder emptying and elicitation of abdominal pain with the CCK administration.  06-13-20: chest x-ray:  Diffuse interstitial opacity with Awanda Mink lines with possible trace pleural fluid. Stable heart size and aortic contours. Negative for air leak  NO NEW EXAMS.  LABS REVIEWED PREVIOUS    03-09-20: wbc 13.7; hgb 12.1; hct 37.7; mcv 80.4 plt 205; glucose 208; bun 21; creat 1.50; k+ 3.2; na++ 141; ca 9.2; hgb a1c 5.9; HIV: nr 03-10-20: wbc 11.2; hgb 11.3; hct 35.9; mcv 82.7 plt 188; glucose 160; bun 18; creat 1.21; k+ 3.3; na++ 139; ca 8.8 liver normal 3.0; mag 2.3 03-12-20: wbc 10.4; hgb 9.3; hct 29.6; mcv 84.3 plt 178; glucose 138; bun 34; creat 1.77; k+ 3.8; na++ 138; ca 8.3 03-14-20: glucose 134; bun 36; creat 1.23; k+ 3.2; na++ 139; ca 8.8 mag 2.1   03-16-20: hgb 10.7; hct 33.4 glucose 127; bun 18; creat 0.86 k+ 3.3; na++ 139; ca 9.2 ;liver normal albumin 2.5 iron 23; tibc 221; ferritin 137 03-23-20: k+ 3.0  03-27-20: glucose 133; bun 34; creat 0.85; k+ 3.6; na++ 142; ca 9.0; AM cortisol: 10.6  05-22-20: wbc 19.4; hgb 14.6; hct 43.8; mcv 81.6 plt 254; glucose 211; bun 35; creat 1.20; k+ 3.0; na++ 138; ca 9.8; liver normal albumin 3.5 mag 1.9; urine culture: <10,000; blood culture: no growth 05-25-20: wbc 14.2; hgb 20.8; hct 34.8; mcv 84.7 plt 212; glucose 109; bun 24; creat 0.86; k+ 3.4; na++ 137; ca 8.9; mag 1.9 05-28-20: wbc 10.9; hgb 11.8; hct 36.8; mcv 81.8 plt 227; glucose 132; bun 10; creat 0.82; k+ 3.0; na++ 137; ca 8.7 06-01-20: wbc 12.2; hgb 12.5; hct 40.3; mcv 81.9 plt 136; glucose 136; bun 12; creat 0.81; k+ 3.0; na++  140; ca 8.9 mag 1.9 06-05-20: wbc 13.6; hgb 10.2; hct 32.9; mcv 93.5 plt 266 glucose 125; bun 35; creat 1.26; k+ 3.5; na++ 137; ca 8.9  06-07-20: wbc 11.9; hgb 9.6; hct 29.8; mcv 82.8 plt 318; glucose 114; bun 42; creat 1.47; k+ 3.9; na++ 138; ca 8.8 GFR 46; liver normal albumin 2.1  06-08-20: glucose 107; bun 57; creat 1.81; k+ 4.3; na++ 135; ca 8.9 GFR 36 06-09-20: glucose 107; bun 52; creat 1.64; k+ 5.3; na++ 136; ca 9.3;  GFR 40; liver normal albumin 2.3 06-10-20: wbc 12.6; hgb 10.5; hct 34.2; mcv 83.4 plt 417; glucose 126; bun 39; creat 4.4; na++ 137; ca 9.5 GFR 58; liver normal albumin 2.2 06-12-20: wbc 8.2; hgb 10.4; hct 33.6; mcv 81.2 plt 364; glucose 123; bun 24; creat 0.87; k+ 3.5; na++ 138; GFR >60; liver normal albumin 1.9 06-13-20: urine culture: <10,000 colonies 06-14-20: wbc 9.0; hgb 10.5; hct 33.8; mcv 80.5 plt 267; glucose 122; bun 20; creat 0.97; k+3.3; na++ 140; ca 9.0 GFR >60 06-29-20: wbc 10.1; hgb 10.7; hct 36.1; mcv 83.0 plt 294; glucose 152; bun 26; creat 1.02; k+ 4.4; na++ 138 ca 8.8 GFR>60 07-24-20 PSA 16.0  NO NEW LABS.   Review of Systems  Constitutional: Negative for malaise/fatigue.  Respiratory: Negative for cough and shortness of breath.   Cardiovascular: Negative for chest pain, palpitations and leg swelling.  Gastrointestinal: Negative for abdominal pain, constipation and heartburn.  Musculoskeletal: Negative for back pain, joint pain and myalgias.  Skin: Negative.   Neurological: Negative for dizziness.  Psychiatric/Behavioral: The patient is not nervous/anxious.     Physical Exam Constitutional:      General: He is not in acute distress.    Appearance: He is well-developed and well-nourished. He is not diaphoretic.  Neck:     Thyroid: No thyromegaly.  Cardiovascular:     Rate and Rhythm: Normal rate and regular rhythm.     Pulses: Normal pulses and intact distal pulses.     Heart sounds:  Normal heart sounds.  Pulmonary:     Effort: Pulmonary effort is  normal. No respiratory distress.     Breath sounds: Normal breath sounds.  Abdominal:     General: Bowel sounds are normal. There is no distension.     Palpations: Abdomen is soft.     Tenderness: There is no abdominal tenderness.  Musculoskeletal:        General: No edema. Normal range of motion.     Cervical back: Neck supple.     Right lower leg: No edema.     Left lower leg: No edema.     Comments: Status post right hip internal fixation 03-10-20      Lymphadenopathy:     Cervical: No cervical adenopathy.  Skin:    General: Skin is warm and dry.  Neurological:     Mental Status: He is alert and oriented to person, place, and time.  Psychiatric:        Mood and Affect: Mood and affect and mood normal.        ASSESSMENT/ PLAN:  TODAY  1. Interstitial lung disease  2. Dementia without behavioral disturbance unspecified dementia type 2. Major depression recurrent chronic  Will continue current medications Will continue current plan of care Will continue to monitor his status.    MD is aware of resident's narcotic use and is in agreement with current plan of care. We will attempt to wean resident as appropriate.  Ok Edwards NP [facility]  Contact [phone] Monday through Friday 8am- 5pm  After hours call [phone]

## 2020-08-07 ENCOUNTER — Telehealth: Payer: Self-pay | Admitting: Urology

## 2020-08-07 NOTE — Telephone Encounter (Signed)
Jeffrey Sherman from [facility] [phone]) called the office today with some confusion from the patient about current meds.   According to their records, patient is no longer taking terazozin 5mg  (started 06/14/20 and stopped 06/29/20).    He is currently taking alfuzozin (started 06/14/20).  They received the order to start Myrbetriq 25mg .    Please advise if this is how you want the patient to proceed (last office note is different than described above).

## 2020-08-08 ENCOUNTER — Non-Acute Institutional Stay (SKILLED_NURSING_FACILITY): Payer: Medicare Other | Admitting: Adult Health

## 2020-08-08 ENCOUNTER — Encounter: Payer: Self-pay | Admitting: Adult Health

## 2020-08-08 DIAGNOSIS — Z0000 Encounter for general adult medical examination without abnormal findings: Secondary | ICD-10-CM | POA: Diagnosis not present

## 2020-08-08 NOTE — Patient Instructions (Signed)
  Jeffrey Sherman , Thank you for taking time to come for your Medicare Wellness Visit. I appreciate your ongoing commitment to your health goals. Please review the following plan we discussed and let me know if I can assist you in the future.   These are the goals we discussed: Goals    . DIET - INCREASE WATER INTAKE    . Follow up with Provider as scheduled    . General - Client will not be readmitted within 30 days (C-SNP)       This is a list of the screening recommended for you and due dates:  Health Maintenance  Topic Date Due  . COVID-19 Vaccine (4 - Booster for Moderna series) 10/11/2020  . Tetanus Vaccine  12/01/2029  . Flu Shot  Completed  . Pneumonia vaccines  Completed  . HPV Vaccine  Aged Out

## 2020-08-08 NOTE — Progress Notes (Signed)
Subjective:   Jeffrey Sherman is a 87 y.o. male who presents for Medicare Annual/Subsequent preventive examination.  Review of Systems     Review of Systems  Constitutional: Negative for malaise/fatigue.  Respiratory: Negative for cough and shortness of breath.   Cardiovascular: Negative for chest pain, palpitations and leg swelling.  Gastrointestinal: Negative for abdominal pain, constipation and heartburn.  Musculoskeletal: Negative for back pain, joint pain and myalgias.  Skin: Negative.   Neurological: Negative for dizziness.  Psychiatric/Behavioral: The patient is not nervous/anxious.     Cardiac Risk Factors include: advanced age (>79men, >61 women);hypertension;male gender     Objective:    Today's Vitals   08/08/20 0814 08/08/20 1005  BP: 129/62   Pulse: 80   Resp: 20   Temp: 98.2 F (36.8 C)   SpO2: 97%   Weight: 167 lb (75.8 kg)   Height: 5\' 9"  (1.753 m)   PainSc:  0-No pain   Body mass index is 24.66 kg/m.  Advanced Directives 08/08/2020 07/24/2020 07/14/2020 07/13/2020 06/30/2020 06/28/2020 06/28/2020  Does Patient Have a Medical Advance Directive? Yes Yes Yes Yes Yes Yes Yes  Type of Advance Directive Living will;Out of facility DNR (pink MOST or yellow form) Living will;Out of facility DNR (pink MOST or yellow form) Living will;Out of facility DNR (pink MOST or yellow form) Living will;Out of facility DNR (pink MOST or yellow form) Living will;Out of facility DNR (pink MOST or yellow form) Living will Living will;Out of facility DNR (pink MOST or yellow form)  Does patient want to make changes to medical advance directive? No - Patient declined No - Patient declined No - Patient declined No - Patient declined No - Patient declined No - Patient declined No - Patient declined  Copy of [place] in Chart? - - - - - No - copy requested -  Would patient like information on creating a medical advance directive? - - - - - - -  Pre-existing out of facility  DNR order (yellow form or pink MOST form) - Yellow form placed in chart (order not valid for inpatient use) Yellow form placed in chart (order not valid for inpatient use) Yellow form placed in chart (order not valid for inpatient use) Yellow form placed in chart (order not valid for inpatient use) - Yellow form placed in chart (order not valid for inpatient use)    Current Medications (verified) Outpatient Encounter Medications as of 08/08/2020  Medication Sig  . acetaminophen (TYLENOL) 500 MG tablet Take 1,000 mg by mouth in the morning, at noon, and at bedtime.  Marland Kitchen alfuzosin (UROXATRAL) 10 MG 24 hr tablet Take 10 mg by mouth daily with breakfast.  . Amino Acids-Protein Hydrolys (PRO-STAT PO) Take 60 mLs by mouth 3 (three) times daily with meals.  . Ascorbic Acid (VITAMIN C) 1000 MG tablet Take 1,000 mg by mouth daily.  Marland Kitchen aspirin 81 MG chewable tablet Chew 1 tablet (81 mg total) by mouth daily with breakfast.  . Balsam [place]-Castor Oil (VENELEX) OINT Special Instructions: Apply to bilateral buttocks & sacral area qshift for blanchable erythema.  . calcium carbonate (TUMS - DOSED IN MG ELEMENTAL CALCIUM) 500 MG chewable tablet Chew 1 tablet by mouth 2 (two) times daily as needed for indigestion or heartburn.   . diltiazem (CARDIZEM CD) 360 MG 24 hr capsule Take 1 capsule (360 mg total) by mouth daily.  . ferrous sulfate 325 (65 FE) MG tablet Take 325 mg by mouth daily with breakfast. For  Anemia and  Iron Deficiency  . Flax Oil-Fish Oil-Borage Oil (FISH OIL-FLAX OIL-BORAGE OIL) CAPS Take 1 capsule by mouth daily.  . hydrALAZINE (APRESOLINE) 50 MG tablet Take 1 tablet (50 mg total) by mouth 3 (three) times daily.  Marland Kitchen ipratropium-albuterol (DUONEB) 0.5-2.5 (3) MG/3ML SOLN Take 3 mLs by nebulization every 6 (six) hours as needed.  . isosorbide mononitrate (IMDUR) 30 MG 24 hr tablet Take 1 tablet (30 mg total) by mouth daily.  Marland Kitchen lisinopril (ZESTRIL) 40 MG tablet Take 1 tablet (40 mg total) by mouth daily.   Marland Kitchen loperamide (IMODIUM A-D) 2 MG tablet amt: 2mg ; oral Special Instructions: after each stool do not exceed more than 4tabs in 24 hours, As Needed  . Melatonin 10 MG TABS Take 10 mg by mouth at bedtime.  . memantine (NAMENDA) 10 MG tablet Take 10 mg by mouth 2 (two) times daily.  . metoprolol tartrate (LOPRESSOR) 25 MG tablet Take 1 tablet (25 mg total) by mouth 2 (two) times daily.  . mirabegron ER (MYRBETRIQ) 25 MG TB24 tablet Take 1 tablet (25 mg total) by mouth daily.  . NON FORMULARY Diet: Regular  . omeprazole (PRILOSEC) 20 MG capsule Take 1 capsule (20 mg total) by mouth daily before breakfast.  . ondansetron (ZOFRAN-ODT) 8 MG disintegrating tablet Take 8 mg by mouth 3 (three) times daily before meals. As needed for vomiting and nausea  . OXYGEN Inhale 2 L into the lungs continuous.  . polyethylene glycol (MIRALAX / GLYCOLAX) 17 g packet Take 17 g by mouth daily as needed.  Marland Kitchen rOPINIRole (REQUIP) 0.25 MG tablet Take 0.25 mg by mouth at bedtime.  . senna-docusate (SENOKOT-S) 8.6-50 MG tablet Take 1 tablet by mouth at bedtime as needed for mild constipation.  . simethicone (MYLICON) 347 MG chewable tablet Chew 125 mg by mouth in the morning, at noon, in the evening, and at bedtime.  Marland Kitchen venlafaxine XR (EFFEXOR-XR) 150 MG 24 hr capsule Take 150 mg by mouth daily with breakfast. Along with 75 mg to = 225 mg  . venlafaxine XR (EFFEXOR-XR) 75 MG 24 hr capsule Take 75 mg by mouth daily with breakfast. Take along with 150 mg to = 425 mg  . salicylic acid 17 % gel Apply 1 application topically in the morning and at bedtime. Apply to warts to left hand   No facility-administered encounter medications on file as of 08/08/2020.    Allergies (verified) Patient has no known allergies.   History: Past Medical History:  Diagnosis Date  . Anxiety   . Cancer [facility])    prostate  . Chronic chest wall pain   . Essential hypertension, benign 06/20/2016  . GAD (generalized anxiety disorder)   . GERD  (gastroesophageal reflux disease)   . H/O echocardiogram 2016   normal  . Heart murmur    "leaking heart valve"  . High cholesterol 06/20/2016  . HOH (hard of hearing)    left  . Hx of falling   . Hyperlipidemia   . Interstitial pulmonary disease ([place])   . MDD (major depressive disorder)   . Murmur, cardiac   . Poor historian   . PUD (peptic ulcer disease)   . RLS (restless legs syndrome)   . Shortness of breath   . Wrist fracture 12/16/2011   Past Surgical History:  Procedure Laterality Date  . CHOLECYSTECTOMY N/A 06/28/2020   Procedure: LAPAROSCOPIC CHOLECYSTECTOMY;  Surgeon: [name], MD;  Location: [facility];  Service: General;  Laterality: N/A;  pt in  [place]  . COLONOSCOPY  2005   Rehman: Sigmoid colon diverticulosis, moderate sized external hemorrhoids. A few focal areas of mucosal erythema felt to be nonspecific.  Marland Kitchen ESOPHAGOGASTRODUODENOSCOPY    . ESOPHAGOGASTRODUODENOSCOPY N/A 07/18/2016   Rehman: normal exam except duodenal scar. h.pylori serologies negative  . FLEXIBLE SIGMOIDOSCOPY N/A 06/11/2020   Procedure: FLEXIBLE SIGMOIDOSCOPY;  Surgeon: Eloise Harman, DO;  Location: [facility];  Service: Endoscopy;  Laterality: N/A;  . HIP PINNING,CANNULATED Right 03/10/2020   Procedure: INTERNAL FIXATION RIGHT HIP;  Surgeon: Carole Civil, MD;  Location: [facility];  Service: Orthopedics;  Laterality: Right;  . ORIF WRIST FRACTURE  12/19/2011   Procedure: OPEN REDUCTION INTERNAL FIXATION (ORIF) WRIST FRACTURE;  Surgeon: Carole Civil, MD;  Location: [facility];  Service: Orthopedics;  Laterality: Right;  . prostate cancer     Diagnosed in the 90s  . TONSILLECTOMY     Family History  Problem Relation Age of Onset  . Cancer Other   . Heart attack Mother   . Cancer Brother   . Heart attack Brother   . Heart disease Sister        by pass   Social History   Socioeconomic History  . Marital status: Widowed    Spouse name: Not on file  . Number of children:  Not on file  . Years of education: Not on file  . Highest education level: Not on file  Occupational History  . Occupation: retired    Fish farm manager: RETIRED  Tobacco Use  . Smoking status: Never Smoker  . Smokeless tobacco: Never Used  Vaping Use  . Vaping Use: Never used  Substance and Sexual Activity  . Alcohol use: No  . Drug use: No  . Sexual activity: Not Currently  Other Topics Concern  . Not on file  Social History Narrative   [facility] of [facility]    Social Determinants of Health   Financial Resource Strain: Not on file  Food Insecurity: Not on file  Transportation Needs: Not on file  Physical Activity: Not on file  Stress: Not on file  Social Connections: Not on file    Tobacco Counseling Counseling given: Not Answered   Clinical Intake:  Pre-visit preparation completed: Yes  Pain : No/denies pain Pain Score: 0-No pain Pain Frequency: Rarely     BMI - recorded: 24.66 Nutritional Status: BMI of 19-24  Normal Nutritional Risks: None Diabetes: No  How often do you need to have someone help you when you read instructions, pamphlets, or other written materials from your doctor or pharmacy?: 1 - Never  Diabetic?no  Interpreter Needed?: No      Activities of Daily Living In your present state of health, do you have any difficulty performing the following activities: 08/08/2020 06/29/2020  Hearing? N -  Vision? N -  Difficulty concentrating or making decisions? N -  Walking or climbing stairs? N -  Dressing or bathing? N -  Doing errands, shopping? N N  Preparing Food and eating ? Y -  Using the Toilet? N -  In the past six months, have you accidently leaked urine? Y -  Do you have problems with loss of bowel control? Y -  Managing your Medications? Y -  Managing your Finances? Y -  Housekeeping or managing your Housekeeping? Y -  Some recent data might be hidden    Patient Care Team: Gerlene Fee, NP as PCP - [facility] [place]  [facility] ([place])  Indicate any recent Medical Services you may have received from other than [facility] providers in the past year (date may be approximate).     Assessment:   This is a routine wellness examination for [place].  Hearing/Vision screen No exam data present  Dietary issues and exercise activities discussed: Current Exercise Habits: Home exercise routine, Type of exercise: walking, Time (Minutes): 10, Frequency (Times/Week): 3, Weekly Exercise (Minutes/Week): 30, Intensity: Mild, Exercise limited by: None identified  Goals    . DIET - INCREASE WATER INTAKE    . Follow up with Provider as scheduled    . General - Client will not be readmitted within 30 days ([facility])      Depression Screen PHQ 2/9 Scores 08/08/2020  PHQ - 2 Score 0    Fall Risk Fall Risk  08/08/2020 06/20/2020 05/05/2019 04/29/2018 01/09/2018  Falls in the past year? 0 1 0 0 No  Comment - - [facility] Telephone Survey: data to providers prior to load [employer] Telephone Survey: data to providers prior to load [facility] Telephone Survey: data to providers prior to load  Number falls in past yr: 0 1 - - -  Injury with Fall? 0 1 - - -  Risk for fall due to : No Fall Risks History of fall(s);Impaired mobility - - -  Follow up - Falls evaluation completed;Falls prevention discussed - - -    FALL RISK PREVENTION PERTAINING TO THE HOME:  Any stairs in or around the home? No If so, are there any without handrails? N/a Home free of loose throw rugs in walkways, pet beds, electrical cords, etc? yes Adequate lighting in your home to reduce risk of falls? Yes   ASSISTIVE DEVICES UTILIZED TO PREVENT FALLS:  Life alert? n/a Use of a cane, walker or w/c? Walker  Grab bars in the bathroom? Yes  Shower chair or bench in shower? Yes  Elevated toilet seat or a handicapped toilet? Yes   TIMED UP AND GO:  Was the test performed? Yes .  Length of time to ambulate 10 feet: 15 sec.   Gait is steady    Cognitive Function: MMSE - Mini Mental State Exam 08/08/2020  Orientation to time 5  Orientation to Place 5  Registration 3  Attention/ Calculation 4  Recall 3  Language- name 2 objects 2  Language- repeat 1  Language- follow 3 step command 3  Language- read & follow direction 1  Write a sentence 0  Copy design 1  Total score 28     6CIT Screen 08/08/2020  What Year? 0 points  What month? 0 points  What time? 0 points  Count back from 20 2 points  Months in reverse 2 points  Repeat phrase 0 points  Total Score 4    Immunizations Immunization History  Administered Date(s) Administered  . Influenza Split 03/10/2013  . Influenza, High Dose Seasonal PF 03/31/2018  . Influenza-Unspecified 03/17/2020  . Moderna Sars-Covid-2 Vaccination 06/22/2019, 08/02/2019, 04/13/2020  . Pneumococcal Conjugate-13 03/16/2014  . Pneumococcal Polysaccharide-23 04/19/1997  . Td 11/14/2006  . Tdap 12/02/2019  . Zoster Recombinat (Shingrix) 05/01/2019, 06/30/2019   Vaccines per facility   Qualifies for Shingles Vaccine?yes  Vaccines per facility   Screening Tests Health Maintenance  Topic Date Due  . COVID-19 Vaccine (4 - Booster for Moderna series) 10/11/2020  . TETANUS/TDAP  12/01/2029  . INFLUENZA VACCINE  Completed  . PNA vac Low Risk Adult  Completed  . HPV VACCINES  Aged Out  Health Maintenance  There are no preventive care reminders to display for this patient. Has had done   Lung Cancer Screening: (Low Dose CT Chest recommended if Age 63-80 years, 30 pack-year currently smoking OR have quit w/in 15years.) does not  qualify.   Lung Cancer Screening Referral: n/a   Additional Screening:  Hepatitis C Screening: yes  qualify; Completed will setup   Vision Screening: Recommended annual ophthalmology exams for early detection of glaucoma and other disorders of the eye. Is the patient up to date with their annual eye exam?  yes Who is the provider or what is the name of  the office in which the patient attends annual eye exams?  If pt is not established with a provider, would they like to be referred to a provider to establish care? Performed at facility   Dental Screening: Recommended annual dental exams for proper oral hygiene  Community Resource Referral / Chronic Care Management: CRR required this visit? no CCM required this visit? no     Plan:     I have personally reviewed and noted the following in the patient's chart:   . Medical and social history . Use of alcohol, tobacco or illicit drugs  . Current medications and supplements . Functional ability and status . Nutritional status . Physical activity . Advanced directives . List of other physicians . Hospitalizations, surgeries, and ER visits in previous 12 months . Vitals . Screenings to include cognitive, depression, and falls . Referrals and appointments  In addition, I have reviewed and discussed with patient certain preventive protocols, quality metrics, and best practice recommendations. A written personalized care plan for preventive services as well as general preventive health recommendations were provided to patient.     Gerlene Fee, NP   08/08/2020

## 2020-08-09 ENCOUNTER — Other Ambulatory Visit: Payer: Self-pay | Admitting: Urology

## 2020-08-09 DIAGNOSIS — C61 Malignant neoplasm of prostate: Secondary | ICD-10-CM

## 2020-08-09 DIAGNOSIS — H524 Presbyopia: Secondary | ICD-10-CM | POA: Diagnosis not present

## 2020-08-09 DIAGNOSIS — H2513 Age-related nuclear cataract, bilateral: Secondary | ICD-10-CM | POA: Diagnosis not present

## 2020-08-10 ENCOUNTER — Other Ambulatory Visit (HOSPITAL_COMMUNITY)
Admission: RE | Admit: 2020-08-10 | Discharge: 2020-08-10 | Disposition: A | Discharge status: Home / Self Care | Payer: Medicare Other | Source: Skilled Nursing Facility | Attending: Adult Health | Admitting: Adult Health

## 2020-08-10 DIAGNOSIS — F4323 Adjustment disorder with mixed anxiety and depressed mood: Secondary | ICD-10-CM | POA: Diagnosis not present

## 2020-08-10 DIAGNOSIS — K828 Other specified diseases of gallbladder: Secondary | ICD-10-CM | POA: Diagnosis not present

## 2020-08-10 LAB — HEPATITIS C ANTIBODY: HCV Ab: NONREACTIVE

## 2020-08-11 ENCOUNTER — Telehealth: Payer: Self-pay | Admitting: Orthopedic Surgery

## 2020-08-11 NOTE — Telephone Encounter (Signed)
[name] from the [facility] called and wants to know if Dr. Aline Brochure still wants to see the patient.  He was suppose to follow up 6 weeks back in December 2021 but was in the hospital and now he is going to be long term at the [facility] do you still want to see the patient now or not he has been in and out of the hospital since then several times.     Please call [name] back and advise  [phone]

## 2020-08-11 NOTE — Telephone Encounter (Signed)
Yes, he had right hip surgery by Dr Aline Brochure, if he is able to come in he should have a follow up. If he is not medically able to come in will let Dr Aline Brochure know.   I will call her, I am on hold currently to make appointment for a patient at [facility]

## 2020-08-11 NOTE — Telephone Encounter (Signed)
I called [name] to ask, and yes he can come in, and yes, he should can you call with scheduling ?

## 2020-08-14 ENCOUNTER — Other Ambulatory Visit: Payer: Medicare Other

## 2020-08-14 ENCOUNTER — Other Ambulatory Visit (HOSPITAL_COMMUNITY)
Admission: RE | Admit: 2020-08-14 | Discharge: 2020-08-14 | Disposition: A | Discharge status: Home / Self Care | Payer: Medicare Other | Source: Skilled Nursing Facility | Attending: Internal Medicine | Admitting: Internal Medicine

## 2020-08-14 DIAGNOSIS — Z8546 Personal history of malignant neoplasm of prostate: Secondary | ICD-10-CM | POA: Insufficient documentation

## 2020-08-14 DIAGNOSIS — N3281 Overactive bladder: Secondary | ICD-10-CM | POA: Insufficient documentation

## 2020-08-14 LAB — CBC WITH DIFFERENTIAL/PLATELET
Abs Immature Granulocytes: 0.02 10*3/uL (ref 0.00–0.07)
Basophils Absolute: 0 10*3/uL (ref 0.0–0.1)
Basophils Relative: 0 %
Eosinophils Absolute: 0.1 10*3/uL (ref 0.0–0.5)
Eosinophils Relative: 2 %
HCT: 40.3 % (ref 39.0–52.0)
Hemoglobin: 12.5 g/dL — ABNORMAL LOW (ref 13.0–17.0)
Immature Granulocytes: 0 %
Lymphocytes Relative: 37 %
Lymphs Abs: 3.1 10*3/uL (ref 0.7–4.0)
MCH: 24.6 pg — ABNORMAL LOW (ref 26.0–34.0)
MCHC: 31 g/dL (ref 30.0–36.0)
MCV: 79.3 fL — ABNORMAL LOW (ref 80.0–100.0)
Monocytes Absolute: 0.7 10*3/uL (ref 0.1–1.0)
Monocytes Relative: 8 %
Neutro Abs: 4.5 10*3/uL (ref 1.7–7.7)
Neutrophils Relative %: 53 %
Platelets: 259 10*3/uL (ref 150–400)
RBC: 5.08 MIL/uL (ref 4.22–5.81)
RDW: 16.5 % — ABNORMAL HIGH (ref 11.5–15.5)
WBC: 8.5 10*3/uL (ref 4.0–10.5)
nRBC: 0 % (ref 0.0–0.2)

## 2020-08-14 LAB — BASIC METABOLIC PANEL
Anion gap: 9 (ref 5–15)
BUN: 33 mg/dL — ABNORMAL HIGH (ref 8–23)
CO2: 32 mmol/L (ref 22–32)
Calcium: 9.4 mg/dL (ref 8.9–10.3)
Chloride: 99 mmol/L (ref 98–111)
Creatinine, Ser: 1 mg/dL (ref 0.61–1.24)
GFR, Estimated: >60 mL/min (ref >60)
Glucose, Bld: 110 mg/dL — ABNORMAL HIGH (ref 70–99)
Potassium: 2.5 mmol/L — CL (ref 3.5–5.1)
Sodium: 140 mmol/L (ref 135–145)

## 2020-08-14 LAB — PSA: Prostatic Specific Antigen: 13.05 ng/mL — ABNORMAL HIGH (ref 0.00–4.00)

## 2020-08-14 NOTE — Telephone Encounter (Signed)
I called back to [facility], spoke with [name], and confirmed appointment which had been scheduled on 08/11/20, for 08/21/20, 9:30am

## 2020-08-15 ENCOUNTER — Non-Acute Institutional Stay (SKILLED_NURSING_FACILITY): Payer: Medicare Other | Admitting: Adult Health

## 2020-08-15 ENCOUNTER — Encounter: Payer: Self-pay | Admitting: Internal Medicine

## 2020-08-15 ENCOUNTER — Other Ambulatory Visit (HOSPITAL_COMMUNITY)
Admission: RE | Admit: 2020-08-15 | Discharge: 2020-08-15 | Disposition: A | Discharge status: Home / Self Care | Payer: Medicare Other | Source: Skilled Nursing Facility | Attending: Adult Health | Admitting: Adult Health

## 2020-08-15 DIAGNOSIS — E876 Hypokalemia: Secondary | ICD-10-CM | POA: Diagnosis not present

## 2020-08-15 DIAGNOSIS — M25552 Pain in left hip: Secondary | ICD-10-CM | POA: Diagnosis not present

## 2020-08-15 DIAGNOSIS — N289 Disorder of kidney and ureter, unspecified: Secondary | ICD-10-CM

## 2020-08-15 DIAGNOSIS — G8929 Other chronic pain: Secondary | ICD-10-CM

## 2020-08-15 LAB — TESTOSTERONE: Testosterone: 123 ng/dL — ABNORMAL LOW (ref 264–916)

## 2020-08-15 LAB — BASIC METABOLIC PANEL
Anion gap: 8 (ref 5–15)
BUN: 31 mg/dL — ABNORMAL HIGH (ref 8–23)
CO2: 30 mmol/L (ref 22–32)
Calcium: 9.2 mg/dL (ref 8.9–10.3)
Chloride: 104 mmol/L (ref 98–111)
Creatinine, Ser: 0.84 mg/dL (ref 0.61–1.24)
GFR, Estimated: >60 mL/min (ref >60)
Glucose, Bld: 114 mg/dL — ABNORMAL HIGH (ref 70–99)
Potassium: 3.1 mmol/L — ABNORMAL LOW (ref 3.5–5.1)
Sodium: 142 mmol/L (ref 135–145)

## 2020-08-15 NOTE — Progress Notes (Unsigned)
Location:  [facility] [place] Room Number: [id number] Place of Service:  SNF (31)   CODE STATUS: dnr  No Known Allergies  Chief Complaint  Patient presents with  . Acute Visit    Follow up labs     HPI:  He is having persistent hypokalemia. His level yesterday was 2.5. he received k+ 40 meq for 3 doses with a level of 3.1 today. He tells me that he has had issues with hypokalemia long term and was taking 40 meq twice daily historically. He denies any issues with symptoms of hypokalemia. Has left hip pain.   Past Medical History:  Diagnosis Date  . Anxiety   . Cancer [facility]    prostate  . Chronic chest wall pain   . Essential hypertension, benign 06/20/2016  . GAD (generalized anxiety disorder)   . GERD (gastroesophageal reflux disease)   . H/O echocardiogram 2016   normal  . Heart murmur    "leaking heart valve"  . High cholesterol 06/20/2016  . HOH (hard of hearing)    left  . Hx of falling   . Hyperlipidemia   . Interstitial pulmonary disease ([place])   . MDD (major depressive disorder)   . Murmur, cardiac   . Poor historian   . PUD (peptic ulcer disease)   . RLS (restless legs syndrome)   . Shortness of breath   . Wrist fracture 12/16/2011    Past Surgical History:  Procedure Laterality Date  . CHOLECYSTECTOMY N/A 06/28/2020   Procedure: LAPAROSCOPIC CHOLECYSTECTOMY;  Surgeon: [name], MD;  Location: [facility] ORS;  Service: General;  Laterality: N/A;  pt in [facility]  . COLONOSCOPY  2005   Rehman: Sigmoid colon diverticulosis, moderate sized external hemorrhoids. A few focal areas of mucosal erythema felt to be nonspecific.  Marland Kitchen ESOPHAGOGASTRODUODENOSCOPY    . ESOPHAGOGASTRODUODENOSCOPY N/A 07/18/2016   Rehman: normal exam except duodenal scar. h.pylori serologies negative  . FLEXIBLE SIGMOIDOSCOPY N/A 06/11/2020   Procedure: FLEXIBLE SIGMOIDOSCOPY;  Surgeon: Eloise Harman, DO;  Location: [facility] ENDO SUITE;  Service: Endoscopy;  Laterality: N/A;  .  HIP PINNING,CANNULATED Right 03/10/2020   Procedure: INTERNAL FIXATION RIGHT HIP;  Surgeon: Carole Civil, MD;  Location: [facility] ORS;  Service: Orthopedics;  Laterality: Right;  . ORIF WRIST FRACTURE  12/19/2011   Procedure: OPEN REDUCTION INTERNAL FIXATION (ORIF) WRIST FRACTURE;  Surgeon: Carole Civil, MD;  Location: [facility] ORS;  Service: Orthopedics;  Laterality: Right;  . prostate cancer     Diagnosed in the 90s  . TONSILLECTOMY      Social History   Socioeconomic History  . Marital status: Widowed    Spouse name: Not on file  . Number of children: Not on file  . Years of education: Not on file  . Highest education level: Not on file  Occupational History  . Occupation: retired    Fish farm manager: RETIRED  Tobacco Use  . Smoking status: Never Smoker  . Smokeless tobacco: Never Used  Vaping Use  . Vaping Use: Never used  Substance and Sexual Activity  . Alcohol use: No  . Drug use: No  . Sexual activity: Not Currently  Other Topics Concern  . Not on file  Social History Narrative   Is long term resident of [facility]    Social Determinants of Health   Financial Resource Strain: Not on file  Food Insecurity: Not on file  Transportation Needs: Not on file  Physical Activity: Not on file  Stress:  Not on file  Social Connections: Not on file  Intimate Partner Violence: Not on file   Family History  Problem Relation Age of Onset  . Cancer Other   . Heart attack Mother   . Cancer Brother   . Heart attack Brother   . Heart disease Sister        by pass      VITAL SIGNS BP 129/68   Pulse 72   Temp 97.9 F (36.6 C)   Resp 20   Ht 5\' 9"  (1.753 m)   Wt 167 lb (75.8 kg)   BMI 24.66 kg/m   Outpatient Encounter Medications as of 08/15/2020  Medication Sig  . potassium chloride SA (KLOR-CON) 20 MEQ tablet Take 20 mEq by mouth daily.  Marland Kitchen acetaminophen (TYLENOL) 500 MG tablet Take 1,000 mg by mouth in the morning, at noon, and at bedtime.  Marland Kitchen alfuzosin (UROXATRAL) 10 MG 24 hr  tablet Take 10 mg by mouth daily with breakfast.  . Amino Acids-Protein Hydrolys (PRO-STAT PO) Take 60 mLs by mouth 3 (three) times daily with meals.  . Ascorbic Acid (VITAMIN C) 1000 MG tablet Take 1,000 mg by mouth daily.  Marland Kitchen aspirin 81 MG chewable tablet Chew 1 tablet (81 mg total) by mouth daily with breakfast.  . Balsam [place]-Castor Oil (VENELEX) OINT Special Instructions: Apply to bilateral buttocks & sacral area qshift for blanchable erythema.  . calcium carbonate (TUMS - DOSED IN MG ELEMENTAL CALCIUM) 500 MG chewable tablet Chew 1 tablet by mouth 2 (two) times daily as needed for indigestion or heartburn.   . diltiazem (CARDIZEM CD) 360 MG 24 hr capsule Take 1 capsule (360 mg total) by mouth daily.  . ferrous sulfate 325 (65 FE) MG tablet Take 325 mg by mouth daily with breakfast. For Anemia and  Iron Deficiency  . Flax Oil-Fish Oil-Borage Oil (FISH OIL-FLAX OIL-BORAGE OIL) CAPS Take 1 capsule by mouth daily.  . hydrALAZINE (APRESOLINE) 50 MG tablet Take 1 tablet (50 mg total) by mouth 3 (three) times daily.  Marland Kitchen ipratropium-albuterol (DUONEB) 0.5-2.5 (3) MG/3ML SOLN Take 3 mLs by nebulization every 6 (six) hours as needed.  . isosorbide mononitrate (IMDUR) 30 MG 24 hr tablet Take 1 tablet (30 mg total) by mouth daily.  Marland Kitchen lisinopril (ZESTRIL) 40 MG tablet Take 1 tablet (40 mg total) by mouth daily.  Marland Kitchen loperamide (IMODIUM A-D) 2 MG tablet amt: 2mg ; oral Special Instructions: after each stool do not exceed more than 4tabs in 24 hours, As Needed  . Melatonin 10 MG TABS Take 10 mg by mouth at bedtime.  . memantine (NAMENDA) 10 MG tablet Take 10 mg by mouth 2 (two) times daily.  . metoprolol tartrate (LOPRESSOR) 25 MG tablet Take 1 tablet (25 mg total) by mouth 2 (two) times daily.  . mirabegron ER (MYRBETRIQ) 25 MG TB24 tablet Take 1 tablet (25 mg total) by mouth daily.  . NON FORMULARY Diet: Regular  . omeprazole (PRILOSEC) 20 MG capsule Take 1 capsule (20 mg total) by mouth daily before  breakfast.  . ondansetron (ZOFRAN-ODT) 8 MG disintegrating tablet Take 8 mg by mouth 3 (three) times daily before meals. As needed for vomiting and nausea  . OXYGEN Inhale 2 L into the lungs continuous.  . polyethylene glycol (MIRALAX / GLYCOLAX) 17 g packet Take 17 g by mouth daily as needed.  Marland Kitchen rOPINIRole (REQUIP) 0.25 MG tablet Take 0.25 mg by mouth at bedtime.  . salicylic acid 17 % gel Apply 1 application topically  in the morning and at bedtime. Apply to warts to left hand  . senna-docusate (SENOKOT-S) 8.6-50 MG tablet Take 1 tablet by mouth at bedtime as needed for mild constipation.  . simethicone (MYLICON) 841 MG chewable tablet Chew 125 mg by mouth in the morning, at noon, in the evening, and at bedtime.  Marland Kitchen venlafaxine XR (EFFEXOR-XR) 150 MG 24 hr capsule Take 150 mg by mouth daily with breakfast. Along with 75 mg to = 225 mg  . venlafaxine XR (EFFEXOR-XR) 75 MG 24 hr capsule Take 75 mg by mouth daily with breakfast. Take along with 150 mg to = 225 mg   No facility-administered encounter medications on file as of 08/15/2020.     SIGNIFICANT DIAGNOSTIC EXAMS   PREVIOUS   03-09-20: right hip x-ray: Minimally displaced right femoral neck fracture.  03-09-20: chest x-ray: Peribronchial thickening with streaky perihilar infiltrates suggesting bronchitis or multifocal pneumonia.  03-12-20: renal ultrasound: No acute abnormality. Bilateral renal cysts  03-12-20: chest x-ray: Lower lung volumes. No acute cardiopulmonary abnormality.   05-22-20: chest x-ray:  1. Mild right basilar atelectasis. 2. Improved cardiomegaly from prior. Stable aortic atherosclerosis and tortuosity  05-22-20: ct of abdomen and pelvis:  Findings consistent with colitis involving the distal transverse colon, splenic flexure, and descending colon. This is likely ischemic or infectious in etiology. Diverticulosis is present but mostly distal to the area of thickening and diverticulitis is therefore less likely. 3 mm  nonobstructing left renal calculus. Small bladder diverticula.  05-26-20: ct angio of abdomen and pelvis:  1. Persistent colitis involving distal transverse colon, splenic flexure, and descending colon. 2. Mild lower abdominal ascites, slightly increased on a. 3. 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 The celiac axis and superior mesenteric artery remain widely patent however, which typically allows adequate collateral perfusion. 4. Increase in small bilateral pleural effusions and dependent atelectasis/consolidation in both lung bases since previous study. 5. Sigmoid diverticulosis. 6. 1 mm mid left ureteral calculus with stable left renal pelvicaliectasis. 7. Right nephrolithiasis. 8. Bilateral renal artery ostial stenosis of possible hemodynamic significance.  9. Aortic Atherosclerosis  06-05-20: ct of abdomen and pelvis:  Mild colonic distension with air-fluid levels and segmental wall thickening likely reflects colitis. Additional stable findings detailed above.  06-09-20: acute abdomen:  Dilated and air-filled colon is noted suggesting possible ileus. No acute cardiopulmonary disease  06-09-20: ct of abdomen and pelvis:  Cholelithiasis. Haziness noted around the gallbladder. Cannot exclude acute cholecystitis. Continued wall thickening in the left colon from the distal transverse colon through the distal descending colon concerning for colitis. Left colonic diverticulosis. Small bilateral pleural effusions, increasing since prior study. Bibasilar atelectasis. Coronary artery disease, aortic atherosclerosis.  06-09-20: abdominal ultrasound:  1. Moderate gallbladder sludge with probable punctate stones. Slight increased wall thickness with small amount of fluid adjacent to the gallbladder but negative sonographic Murphy. Findings are suggestive of but not definitive for cholecystitis; consider correlation with nuclear medicine hepatobiliary imaging. 6 mm  gallbladder polyp. 2. Simple appearing renal cysts. 3. Incidental note made of pleural effusions.  06-12-20: HIDA:  1. No scintigraphic evidence of acute cholecystitis. 2. Findings compatible with severe biliary dyskinesia with little to no gallbladder emptying and elicitation of abdominal pain with the CCK administration.  06-13-20: chest x-ray:  Diffuse interstitial opacity with Awanda Mink lines with possible trace pleural fluid. Stable heart size and aortic contours. Negative for air leak  NO NEW EXAMS.      LABS REVIEWED PREVIOUS    03-09-20: wbc 13.7;  hgb 12.1; hct 37.7; mcv 80.4 plt 205; glucose 208; bun 21; creat 1.50; k+ 3.2; na++ 141; ca 9.2; hgb a1c 5.9; HIV: nr 03-10-20: wbc 11.2; hgb 11.3; hct 35.9; mcv 82.7 plt 188; glucose 160; bun 18; creat 1.21; k+ 3.3; na++ 139; ca 8.8 liver normal 3.0; mag 2.3 03-12-20: wbc 10.4; hgb 9.3; hct 29.6; mcv 84.3 plt 178; glucose 138; bun 34; creat 1.77; k+ 3.8; na++ 138; ca 8.3 03-14-20: glucose 134; bun 36; creat 1.23; k+ 3.2; na++ 139; ca 8.8 mag 2.1   03-16-20: hgb 10.7; hct 33.4 glucose 127; bun 18; creat 0.86 k+ 3.3; na++ 139; ca 9.2 ;liver normal albumin 2.5 iron 23; tibc 221; ferritin 137 03-23-20: k+ 3.0  03-27-20: glucose 133; bun 34; creat 0.85; k+ 3.6; na++ 142; ca 9.0; AM cortisol: 10.6  05-22-20: wbc 19.4; hgb 14.6; hct 43.8; mcv 81.6 plt 254; glucose 211; bun 35; creat 1.20; k+ 3.0; na++ 138; ca 9.8; liver normal albumin 3.5 mag 1.9; urine culture: <10,000; blood culture: no growth 05-25-20: wbc 14.2; hgb 20.8; hct 34.8; mcv 84.7 plt 212; glucose 109; bun 24; creat 0.86; k+ 3.4; na++ 137; ca 8.9; mag 1.9 05-28-20: wbc 10.9; hgb 11.8; hct 36.8; mcv 81.8 plt 227; glucose 132; bun 10; creat 0.82; k+ 3.0; na++ 137; ca 8.7 06-01-20: wbc 12.2; hgb 12.5; hct 40.3; mcv 81.9 plt 136; glucose 136; bun 12; creat 0.81; k+ 3.0; na++ 140; ca 8.9 mag 1.9 06-05-20: wbc 13.6; hgb 10.2; hct 32.9; mcv 93.5 plt 266 glucose 125; bun 35; creat 1.26; k+ 3.5; na++  137; ca 8.9  06-07-20: wbc 11.9; hgb 9.6; hct 29.8; mcv 82.8 plt 318; glucose 114; bun 42; creat 1.47; k+ 3.9; na++ 138; ca 8.8 GFR 46; liver normal albumin 2.1  06-08-20: glucose 107; bun 57; creat 1.81; k+ 4.3; na++ 135; ca 8.9 GFR 36 06-09-20: glucose 107; bun 52; creat 1.64; k+ 5.3; na++ 136; ca 9.3;  GFR 40; liver normal albumin 2.3 06-10-20: wbc 12.6; hgb 10.5; hct 34.2; mcv 83.4 plt 417; glucose 126; bun 39; creat 4.4; na++ 137; ca 9.5 GFR 58; liver normal albumin 2.2 06-12-20: wbc 8.2; hgb 10.4; hct 33.6; mcv 81.2 plt 364; glucose 123; bun 24; creat 0.87; k+ 3.5; na++ 138; GFR >60; liver normal albumin 1.9 06-13-20: urine culture: <10,000 colonies 06-14-20: wbc 9.0; hgb 10.5; hct 33.8; mcv 80.5 plt 267; glucose 122; bun 20; creat 0.97; k+3.3; na++ 140; ca 9.0 GFR >60 06-29-20: wbc 10.1; hgb 10.7; hct 36.1; mcv 83.0 plt 294; glucose 152; bun 26; creat 1.02; k+ 4.4; na++ 138 ca 8.8 GFR>60 07-24-20 PSA 16.0  TODAY  08-14-20: glucose 110; bun 33; creat 1.00 ;k+ 2.5 na++ 140 08-15-20: glucose 114; bun 31; creat  0.84; k+ 3.1; na++ 142   Review of Systems  Constitutional: Negative for malaise/fatigue.  Respiratory: Negative for cough and shortness of breath.   Cardiovascular: Negative for chest pain, palpitations and leg swelling.  Gastrointestinal: Negative for abdominal pain, constipation and heartburn.  Musculoskeletal: Positive for joint pain. Negative for back pain and myalgias.       Left hip pain   Skin: Negative.   Neurological: Negative for dizziness.  Psychiatric/Behavioral: The patient is not nervous/anxious.     Physical Exam Constitutional:      General: He is not in acute distress.    Appearance: He is well-developed and well-nourished. He is not diaphoretic.  Neck:     Thyroid: No thyromegaly.  Cardiovascular:  Rate and Rhythm: Normal rate and regular rhythm.     Pulses: Normal pulses and intact distal pulses.     Heart sounds: Normal heart sounds.  Pulmonary:     Effort:  Pulmonary effort is normal. No respiratory distress.     Breath sounds: Normal breath sounds.  Abdominal:     General: Bowel sounds are normal. There is no distension.     Palpations: Abdomen is soft.     Tenderness: There is no abdominal tenderness.  Musculoskeletal:        General: No edema. Normal range of motion.     Cervical back: Neck supple.     Right lower leg: No edema.     Left lower leg: No edema.     Comments: Status post right hip internal fixation 03-10-20      Lymphadenopathy:     Cervical: No cervical adenopathy.  Skin:    General: Skin is warm and dry.  Neurological:     Mental Status: He is alert and oriented to person, place, and time.  Psychiatric:        Mood and Affect: Mood and affect and mood normal.     ASSESSMENT/ PLAN:  TODAY  1. Renal insuffiencey 2. Hypokalemia 3. Chronic left hip pain   His status has not improved Will give 40 meq one time today then Will increase k+ to 20 meq twice daily  Will repeat k+ on 08-21-20 Will begin voltaren gel 1% 4 gm to left hip twice daily     Ok Edwards NP [facility]  Contact [phone] Monday through Friday 8am- 5pm  After hours call [phone]

## 2020-08-17 DIAGNOSIS — F4323 Adjustment disorder with mixed anxiety and depressed mood: Secondary | ICD-10-CM | POA: Diagnosis not present

## 2020-08-21 ENCOUNTER — Ambulatory Visit (INDEPENDENT_AMBULATORY_CARE_PROVIDER_SITE_OTHER): Payer: Medicare Other | Admitting: Orthopedic Surgery

## 2020-08-21 ENCOUNTER — Other Ambulatory Visit (HOSPITAL_COMMUNITY)
Admission: RE | Admit: 2020-08-21 | Discharge: 2020-08-21 | Disposition: A | Discharge status: Home / Self Care | Payer: Medicare Other | Source: Skilled Nursing Facility | Attending: Adult Health | Admitting: Adult Health

## 2020-08-21 ENCOUNTER — Inpatient Hospital Stay: Payer: Medicare Other

## 2020-08-21 DIAGNOSIS — E876 Hypokalemia: Secondary | ICD-10-CM | POA: Insufficient documentation

## 2020-08-21 DIAGNOSIS — M5136 Other intervertebral disc degeneration, lumbar region: Secondary | ICD-10-CM

## 2020-08-21 DIAGNOSIS — S72001A Fracture of unspecified part of neck of right femur, initial encounter for closed fracture: Secondary | ICD-10-CM

## 2020-08-21 LAB — POTASSIUM: Potassium: 3.5 mmol/L (ref 3.5–5.1)

## 2020-08-21 MED ORDER — METHOCARBAMOL 500 MG PO TABS: 500.0000 mg | ORAL_TABLET | Freq: Three times a day (TID) | ORAL | 1 refills | Status: DC

## 2020-08-21 NOTE — Progress Notes (Signed)
Chief Complaint  Patient presents with  . Follow-up    Recheck on right hip, DOS 03-10-20.   87 year old male status post ORIF right hip cannulated screws 5 months ago complains of hip pain  He is actually complaining of lower back pain radiating to his right hip  X-rays show cannulated screws x4 with washers intact fracture healed  Patient has a lumbar film from September of last year showing lumbar disc disease  Clinical exam reveals tenderness in his lower back and around to the right hip with normal hip exam  Encounter Diagnoses  Name Primary?  . Closed displaced fracture of right femoral neck ([place]) 03/10/20 cannulated screw fixation    . DDD (degenerative disc disease), lumbar Yes     Recommend Robaxin Physical therapy Can add Tylenol  No follow-up needed  Meds ordered this encounter  Medications  . methocarbamol (ROBAXIN) 500 MG tablet    Sig: Take 1 tablet (500 mg total) by mouth 3 (three) times daily.    Dispense:  60 tablet    Refill:  1

## 2020-08-21 NOTE — Patient Instructions (Addendum)
Start therapy on the back   Meds ordered this encounter  Medications  . methocarbamol (ROBAXIN) 500 MG tablet    Sig: Take 1 tablet (500 mg total) by mouth 3 (three) times daily.    Dispense:  60 tablet    Refill:  1

## 2020-08-22 ENCOUNTER — Ambulatory Visit: Payer: Medicare Other | Admitting: Urology

## 2020-08-24 DIAGNOSIS — F4323 Adjustment disorder with mixed anxiety and depressed mood: Secondary | ICD-10-CM | POA: Diagnosis not present

## 2020-08-29 DIAGNOSIS — L603 Nail dystrophy: Secondary | ICD-10-CM | POA: Diagnosis not present

## 2020-08-29 DIAGNOSIS — I739 Peripheral vascular disease, unspecified: Secondary | ICD-10-CM | POA: Diagnosis not present

## 2020-08-29 DIAGNOSIS — B351 Tinea unguium: Secondary | ICD-10-CM | POA: Diagnosis not present

## 2020-08-31 DIAGNOSIS — F4323 Adjustment disorder with mixed anxiety and depressed mood: Secondary | ICD-10-CM | POA: Diagnosis not present

## 2020-09-07 ENCOUNTER — Encounter: Payer: Self-pay | Admitting: Internal Medicine

## 2020-09-07 ENCOUNTER — Non-Acute Institutional Stay (SKILLED_NURSING_FACILITY): Payer: Medicare Other | Admitting: Internal Medicine

## 2020-09-07 ENCOUNTER — Ambulatory Visit (INDEPENDENT_AMBULATORY_CARE_PROVIDER_SITE_OTHER): Payer: Medicare Other | Admitting: Gastroenterology

## 2020-09-07 DIAGNOSIS — N3281 Overactive bladder: Secondary | ICD-10-CM

## 2020-09-07 DIAGNOSIS — I10 Essential (primary) hypertension: Secondary | ICD-10-CM | POA: Diagnosis not present

## 2020-09-07 DIAGNOSIS — C61 Malignant neoplasm of prostate: Secondary | ICD-10-CM

## 2020-09-07 DIAGNOSIS — E876 Hypokalemia: Secondary | ICD-10-CM

## 2020-09-07 DIAGNOSIS — F4323 Adjustment disorder with mixed anxiety and depressed mood: Secondary | ICD-10-CM | POA: Diagnosis not present

## 2020-09-07 DIAGNOSIS — I129 Hypertensive chronic kidney disease with stage 1 through stage 4 chronic kidney disease, or unspecified chronic kidney disease: Secondary | ICD-10-CM | POA: Insufficient documentation

## 2020-09-07 DIAGNOSIS — N183 Chronic kidney disease, stage 3 unspecified: Secondary | ICD-10-CM | POA: Insufficient documentation

## 2020-09-07 DIAGNOSIS — N1832 Chronic kidney disease, stage 3b: Secondary | ICD-10-CM | POA: Diagnosis not present

## 2020-09-07 NOTE — Assessment & Plan Note (Addendum)
Hypokalemia corrected 06/09/2020 creatinine was 1.64 and GFR 40 compatible with CKD stage IIIb.  Current creatinine is 0.84 and GFR greater than 60 which would be CKD stage II.  Spironolactone is an option with improved renal function.

## 2020-09-07 NOTE — Assessment & Plan Note (Signed)
>>  ASSESSMENT AND PLAN FOR CHRONIC KIDNEY DISEASE, STAGE II (MILD) WRITTEN ON 09/07/2020  1:12 PM BY HOPPER, Titus Dubin, MD  With hypertension and hypokalemia; spironolactone could be considered with close monitor of the renal function.

## 2020-09-07 NOTE — Assessment & Plan Note (Signed)
With hypertension and hypokalemia; spironolactone could be considered with close monitor of the renal function.

## 2020-09-07 NOTE — Assessment & Plan Note (Addendum)
Biochemical recurrence must be deferred to Urology expertise due to multiple co-morbidities & advanced age

## 2020-09-07 NOTE — Patient Instructions (Signed)
See assessment and plan under each diagnosis in the problem list and acutely for this visit 

## 2020-09-07 NOTE — Progress Notes (Signed)
NURSING HOME LOCATION: [facility] ROOM NUMBER: 156/D   CODE STATUS: DNR   PCP:  Gerlene Fee, NP  This is a nursing facility follow up visit of chronic medical diagnoses & to document compliance with Regulation 483.30 (c) in [employer] [place] Phase 2 which mandates caregiver visit ( visits can alternate among physician, PA or NP as per statutes) within 10 days of 30 days / 60 days/ 90 days post admission to SNF date    Interim medical record and care since last SNF visit was updated with review of diagnostic studies and change in clinical status since last visit were documented.  HPI: He is a permanent resident of facility with medical diagnoses of RLS, GERD, history of peptic ulcer disease, major depression, GAD, interstitial lung disease, dyslipidemia, and history of prostate cancer. Surgeries and procedures include cholecystectomy and prostate biopsy. He previously was on HDT with Lupron.r. He is a nondrinker and never smoked. Family history is strongly positive for cancer as well as heart disease.   Review of systems: His chief concern is urinary frequency and oliguria.  He states that he urinates 2-4 ounces every 1-2 hours especially at night.  He has a Urology appointment in May.  He feels the Urologist may want to reinitiate the Lupron which was of benefit previously.   Since he had the cholecystectomy the diarrhea has resolved but he now describes constipation.  He denies depression but states " I stay nervous".  Constitutional: No fever, significant weight change, fatigue  Eyes: No redness, discharge, pain, vision change ENT/mouth: No nasal congestion,  purulent discharge, earache, change in hearing, sore throat  Cardiovascular: No chest pain, palpitations, paroxysmal nocturnal dyspnea, claudication, edema  Respiratory: No cough, sputum production, hemoptysis, DOE, significant snoring, apnea   Gastrointestinal: No heartburn, dysphagia,  abdominal pain, nausea /vomiting, rectal bleeding, melena Genitourinary: No dysuria, hematuria, pyuria Musculoskeletal: No joint stiffness, joint swelling, weakness, pain Dermatologic: No rash, pruritus, change in appearance of skin Neurologic: No dizziness, headache, syncope, seizures, numbness, tingling Psychiatric: No significant insomnia, anorexia Endocrine: No change in hair/skin/nails, excessive thirst, excessive hunger, excessive urination  Hematologic/lymphatic: No significant bruising, lymphadenopathy, abnormal bleeding Allergy/immunology: No itchy/watery eyes, significant sneezing, urticaria, angioedema  Physical exam:  Pertinent or positive findings: Despite the diagnosis of dementia, he is alert and oriented providing the correct date.  He could not name his Urologist.  Pattern alopecia is present.  The sclera of the left eye is visible above the iris giving it a wide-eyed character vs the right eye.  There is slight ptosis on the right.  He has only a few remaining mandibular teeth.  Grade 1/2 systolic murmur is present at the base.  Minor scattered rales are noted posteriorly.  Abdomen is protuberant.  Posterior tibial pulses are stronger than dorsalis pedis pulses.  The strength in the upper extremities is fair to opposition.  Lower extremities are stronger.  There is posttraumatic scarring of the left forearm.  General appearance: Adequately nourished; no acute distress, increased work of breathing is present.   Lymphatic: No lymphadenopathy about the head, neck, axilla. Eyes: No conjunctival inflammation or lid edema is present. There is no scleral icterus. Ears:  External ear exam shows no significant lesions or deformities.   Nose:  External nasal examination shows no deformity or inflammation. Nasal mucosa are pink and moist without lesions, exudates Oral exam:  Lips and gums are healthy appearing.  Neck:  No thyromegaly, masses, tenderness noted.  Heart:  Normal rate and  regular rhythm. S1 and S2 normal without gallop, click, rub .  Lungs:  without wheezes, rhonchi, rubs. Abdomen: Bowel sounds are normal. Abdomen is soft and nontender with no organomegaly, hernias, masses. GU: Deferred to Urologist Extremities:  No cyanosis, clubbing, edema  Skin: Warm & dry w/o tenting. No significant rash.  See summary under each active problem in the Problem List with associated updated therapeutic plan

## 2020-09-07 NOTE — Assessment & Plan Note (Addendum)
He describes urinary frequency with production of 2-4 ounces of urine every 1-2 hours, especially at night.  Urology follow-up is scheduled for May.  He anticipates he may wish to reinitiate Lupron which was of benefit remotely.

## 2020-09-07 NOTE — Assessment & Plan Note (Signed)
Today's blood pressure is an outlier; blood pressure is monitored twice daily and is usually within normal limits. Recent significant hypokalemia has been corrected with supplementation.

## 2020-09-08 DIAGNOSIS — K529 Noninfective gastroenteritis and colitis, unspecified: Secondary | ICD-10-CM | POA: Diagnosis not present

## 2020-09-08 DIAGNOSIS — M545 Low back pain, unspecified: Secondary | ICD-10-CM | POA: Diagnosis not present

## 2020-09-08 DIAGNOSIS — R262 Difficulty in walking, not elsewhere classified: Secondary | ICD-10-CM | POA: Diagnosis not present

## 2020-09-08 DIAGNOSIS — Z9181 History of falling: Secondary | ICD-10-CM | POA: Diagnosis not present

## 2020-09-08 DIAGNOSIS — M6281 Muscle weakness (generalized): Secondary | ICD-10-CM | POA: Diagnosis not present

## 2020-09-08 DIAGNOSIS — M5136 Other intervertebral disc degeneration, lumbar region: Secondary | ICD-10-CM | POA: Diagnosis not present

## 2020-09-08 DIAGNOSIS — Z23 Encounter for immunization: Secondary | ICD-10-CM | POA: Diagnosis not present

## 2020-09-10 DIAGNOSIS — K529 Noninfective gastroenteritis and colitis, unspecified: Secondary | ICD-10-CM | POA: Diagnosis not present

## 2020-09-10 DIAGNOSIS — R262 Difficulty in walking, not elsewhere classified: Secondary | ICD-10-CM | POA: Diagnosis not present

## 2020-09-10 DIAGNOSIS — M6281 Muscle weakness (generalized): Secondary | ICD-10-CM | POA: Diagnosis not present

## 2020-09-10 DIAGNOSIS — M5136 Other intervertebral disc degeneration, lumbar region: Secondary | ICD-10-CM | POA: Diagnosis not present

## 2020-09-10 DIAGNOSIS — M545 Low back pain, unspecified: Secondary | ICD-10-CM | POA: Diagnosis not present

## 2020-09-10 DIAGNOSIS — Z9181 History of falling: Secondary | ICD-10-CM | POA: Diagnosis not present

## 2020-09-11 DIAGNOSIS — R262 Difficulty in walking, not elsewhere classified: Secondary | ICD-10-CM | POA: Diagnosis not present

## 2020-09-11 DIAGNOSIS — K529 Noninfective gastroenteritis and colitis, unspecified: Secondary | ICD-10-CM | POA: Diagnosis not present

## 2020-09-11 DIAGNOSIS — M6281 Muscle weakness (generalized): Secondary | ICD-10-CM | POA: Diagnosis not present

## 2020-09-11 DIAGNOSIS — M545 Low back pain, unspecified: Secondary | ICD-10-CM | POA: Diagnosis not present

## 2020-09-11 DIAGNOSIS — Z9181 History of falling: Secondary | ICD-10-CM | POA: Diagnosis not present

## 2020-09-11 DIAGNOSIS — M5136 Other intervertebral disc degeneration, lumbar region: Secondary | ICD-10-CM | POA: Diagnosis not present

## 2020-09-12 DIAGNOSIS — M6281 Muscle weakness (generalized): Secondary | ICD-10-CM | POA: Diagnosis not present

## 2020-09-12 DIAGNOSIS — M545 Low back pain, unspecified: Secondary | ICD-10-CM | POA: Diagnosis not present

## 2020-09-12 DIAGNOSIS — Z9181 History of falling: Secondary | ICD-10-CM | POA: Diagnosis not present

## 2020-09-12 DIAGNOSIS — K529 Noninfective gastroenteritis and colitis, unspecified: Secondary | ICD-10-CM | POA: Diagnosis not present

## 2020-09-12 DIAGNOSIS — R262 Difficulty in walking, not elsewhere classified: Secondary | ICD-10-CM | POA: Diagnosis not present

## 2020-09-12 DIAGNOSIS — M5136 Other intervertebral disc degeneration, lumbar region: Secondary | ICD-10-CM | POA: Diagnosis not present

## 2020-09-14 DIAGNOSIS — M6281 Muscle weakness (generalized): Secondary | ICD-10-CM | POA: Diagnosis not present

## 2020-09-14 DIAGNOSIS — M5136 Other intervertebral disc degeneration, lumbar region: Secondary | ICD-10-CM | POA: Diagnosis not present

## 2020-09-14 DIAGNOSIS — Z9181 History of falling: Secondary | ICD-10-CM | POA: Diagnosis not present

## 2020-09-14 DIAGNOSIS — R262 Difficulty in walking, not elsewhere classified: Secondary | ICD-10-CM | POA: Diagnosis not present

## 2020-09-14 DIAGNOSIS — M545 Low back pain, unspecified: Secondary | ICD-10-CM | POA: Diagnosis not present

## 2020-09-14 DIAGNOSIS — K529 Noninfective gastroenteritis and colitis, unspecified: Secondary | ICD-10-CM | POA: Diagnosis not present

## 2020-09-15 DIAGNOSIS — F4323 Adjustment disorder with mixed anxiety and depressed mood: Secondary | ICD-10-CM | POA: Diagnosis not present

## 2020-09-15 DIAGNOSIS — M545 Low back pain, unspecified: Secondary | ICD-10-CM | POA: Diagnosis not present

## 2020-09-15 DIAGNOSIS — Z9181 History of falling: Secondary | ICD-10-CM | POA: Diagnosis not present

## 2020-09-15 DIAGNOSIS — M5136 Other intervertebral disc degeneration, lumbar region: Secondary | ICD-10-CM | POA: Diagnosis not present

## 2020-09-15 DIAGNOSIS — R262 Difficulty in walking, not elsewhere classified: Secondary | ICD-10-CM | POA: Diagnosis not present

## 2020-09-15 DIAGNOSIS — K529 Noninfective gastroenteritis and colitis, unspecified: Secondary | ICD-10-CM | POA: Diagnosis not present

## 2020-09-15 DIAGNOSIS — M6281 Muscle weakness (generalized): Secondary | ICD-10-CM | POA: Diagnosis not present

## 2020-09-18 DIAGNOSIS — M5136 Other intervertebral disc degeneration, lumbar region: Secondary | ICD-10-CM | POA: Diagnosis not present

## 2020-09-18 DIAGNOSIS — K529 Noninfective gastroenteritis and colitis, unspecified: Secondary | ICD-10-CM | POA: Diagnosis not present

## 2020-09-18 DIAGNOSIS — Z9181 History of falling: Secondary | ICD-10-CM | POA: Diagnosis not present

## 2020-09-18 DIAGNOSIS — R262 Difficulty in walking, not elsewhere classified: Secondary | ICD-10-CM | POA: Diagnosis not present

## 2020-09-18 DIAGNOSIS — M6281 Muscle weakness (generalized): Secondary | ICD-10-CM | POA: Diagnosis not present

## 2020-09-18 DIAGNOSIS — M545 Low back pain, unspecified: Secondary | ICD-10-CM | POA: Diagnosis not present

## 2020-09-19 DIAGNOSIS — K529 Noninfective gastroenteritis and colitis, unspecified: Secondary | ICD-10-CM | POA: Diagnosis not present

## 2020-09-19 DIAGNOSIS — M5136 Other intervertebral disc degeneration, lumbar region: Secondary | ICD-10-CM | POA: Diagnosis not present

## 2020-09-19 DIAGNOSIS — M545 Low back pain, unspecified: Secondary | ICD-10-CM | POA: Diagnosis not present

## 2020-09-19 DIAGNOSIS — M6281 Muscle weakness (generalized): Secondary | ICD-10-CM | POA: Diagnosis not present

## 2020-09-19 DIAGNOSIS — R262 Difficulty in walking, not elsewhere classified: Secondary | ICD-10-CM | POA: Diagnosis not present

## 2020-09-19 DIAGNOSIS — Z9181 History of falling: Secondary | ICD-10-CM | POA: Diagnosis not present

## 2020-09-20 DIAGNOSIS — R262 Difficulty in walking, not elsewhere classified: Secondary | ICD-10-CM | POA: Diagnosis not present

## 2020-09-20 DIAGNOSIS — Z23 Encounter for immunization: Secondary | ICD-10-CM | POA: Diagnosis not present

## 2020-09-20 DIAGNOSIS — Z9181 History of falling: Secondary | ICD-10-CM | POA: Diagnosis not present

## 2020-09-20 DIAGNOSIS — K529 Noninfective gastroenteritis and colitis, unspecified: Secondary | ICD-10-CM | POA: Diagnosis not present

## 2020-09-20 DIAGNOSIS — M5136 Other intervertebral disc degeneration, lumbar region: Secondary | ICD-10-CM | POA: Diagnosis not present

## 2020-09-20 DIAGNOSIS — M545 Low back pain, unspecified: Secondary | ICD-10-CM | POA: Diagnosis not present

## 2020-09-20 DIAGNOSIS — M6281 Muscle weakness (generalized): Secondary | ICD-10-CM | POA: Diagnosis not present

## 2020-09-21 DIAGNOSIS — M5136 Other intervertebral disc degeneration, lumbar region: Secondary | ICD-10-CM | POA: Diagnosis not present

## 2020-09-21 DIAGNOSIS — M6281 Muscle weakness (generalized): Secondary | ICD-10-CM | POA: Diagnosis not present

## 2020-09-21 DIAGNOSIS — Z9181 History of falling: Secondary | ICD-10-CM | POA: Diagnosis not present

## 2020-09-21 DIAGNOSIS — K529 Noninfective gastroenteritis and colitis, unspecified: Secondary | ICD-10-CM | POA: Diagnosis not present

## 2020-09-21 DIAGNOSIS — R262 Difficulty in walking, not elsewhere classified: Secondary | ICD-10-CM | POA: Diagnosis not present

## 2020-09-21 DIAGNOSIS — M545 Low back pain, unspecified: Secondary | ICD-10-CM | POA: Diagnosis not present

## 2020-09-22 DIAGNOSIS — M5136 Other intervertebral disc degeneration, lumbar region: Secondary | ICD-10-CM | POA: Diagnosis not present

## 2020-09-22 DIAGNOSIS — Z9181 History of falling: Secondary | ICD-10-CM | POA: Diagnosis not present

## 2020-09-22 DIAGNOSIS — K529 Noninfective gastroenteritis and colitis, unspecified: Secondary | ICD-10-CM | POA: Diagnosis not present

## 2020-09-22 DIAGNOSIS — M6281 Muscle weakness (generalized): Secondary | ICD-10-CM | POA: Diagnosis not present

## 2020-09-22 DIAGNOSIS — M545 Low back pain, unspecified: Secondary | ICD-10-CM | POA: Diagnosis not present

## 2020-09-22 DIAGNOSIS — R262 Difficulty in walking, not elsewhere classified: Secondary | ICD-10-CM | POA: Diagnosis not present

## 2020-09-25 DIAGNOSIS — M6281 Muscle weakness (generalized): Secondary | ICD-10-CM | POA: Diagnosis not present

## 2020-09-25 DIAGNOSIS — M5136 Other intervertebral disc degeneration, lumbar region: Secondary | ICD-10-CM | POA: Diagnosis not present

## 2020-09-25 DIAGNOSIS — Z9181 History of falling: Secondary | ICD-10-CM | POA: Diagnosis not present

## 2020-09-25 DIAGNOSIS — M545 Low back pain, unspecified: Secondary | ICD-10-CM | POA: Diagnosis not present

## 2020-09-25 DIAGNOSIS — K529 Noninfective gastroenteritis and colitis, unspecified: Secondary | ICD-10-CM | POA: Diagnosis not present

## 2020-09-25 DIAGNOSIS — R262 Difficulty in walking, not elsewhere classified: Secondary | ICD-10-CM | POA: Diagnosis not present

## 2020-09-26 DIAGNOSIS — K529 Noninfective gastroenteritis and colitis, unspecified: Secondary | ICD-10-CM | POA: Diagnosis not present

## 2020-09-26 DIAGNOSIS — Z9181 History of falling: Secondary | ICD-10-CM | POA: Diagnosis not present

## 2020-09-26 DIAGNOSIS — M545 Low back pain, unspecified: Secondary | ICD-10-CM | POA: Diagnosis not present

## 2020-09-26 DIAGNOSIS — R262 Difficulty in walking, not elsewhere classified: Secondary | ICD-10-CM | POA: Diagnosis not present

## 2020-09-26 DIAGNOSIS — M5136 Other intervertebral disc degeneration, lumbar region: Secondary | ICD-10-CM | POA: Diagnosis not present

## 2020-09-26 DIAGNOSIS — M6281 Muscle weakness (generalized): Secondary | ICD-10-CM | POA: Diagnosis not present

## 2020-09-27 DIAGNOSIS — Z9181 History of falling: Secondary | ICD-10-CM | POA: Diagnosis not present

## 2020-09-27 DIAGNOSIS — M6281 Muscle weakness (generalized): Secondary | ICD-10-CM | POA: Diagnosis not present

## 2020-09-27 DIAGNOSIS — R262 Difficulty in walking, not elsewhere classified: Secondary | ICD-10-CM | POA: Diagnosis not present

## 2020-09-27 DIAGNOSIS — M5136 Other intervertebral disc degeneration, lumbar region: Secondary | ICD-10-CM | POA: Diagnosis not present

## 2020-09-27 DIAGNOSIS — K529 Noninfective gastroenteritis and colitis, unspecified: Secondary | ICD-10-CM | POA: Diagnosis not present

## 2020-09-27 DIAGNOSIS — M545 Low back pain, unspecified: Secondary | ICD-10-CM | POA: Diagnosis not present

## 2020-09-28 DIAGNOSIS — R262 Difficulty in walking, not elsewhere classified: Secondary | ICD-10-CM | POA: Diagnosis not present

## 2020-09-28 DIAGNOSIS — Z9181 History of falling: Secondary | ICD-10-CM | POA: Diagnosis not present

## 2020-09-28 DIAGNOSIS — F4323 Adjustment disorder with mixed anxiety and depressed mood: Secondary | ICD-10-CM | POA: Diagnosis not present

## 2020-09-28 DIAGNOSIS — M545 Low back pain, unspecified: Secondary | ICD-10-CM | POA: Diagnosis not present

## 2020-09-28 DIAGNOSIS — M5136 Other intervertebral disc degeneration, lumbar region: Secondary | ICD-10-CM | POA: Diagnosis not present

## 2020-09-28 DIAGNOSIS — M6281 Muscle weakness (generalized): Secondary | ICD-10-CM | POA: Diagnosis not present

## 2020-09-28 DIAGNOSIS — K529 Noninfective gastroenteritis and colitis, unspecified: Secondary | ICD-10-CM | POA: Diagnosis not present

## 2020-09-29 DIAGNOSIS — M5136 Other intervertebral disc degeneration, lumbar region: Secondary | ICD-10-CM | POA: Diagnosis not present

## 2020-09-29 DIAGNOSIS — M6281 Muscle weakness (generalized): Secondary | ICD-10-CM | POA: Diagnosis not present

## 2020-09-29 DIAGNOSIS — K529 Noninfective gastroenteritis and colitis, unspecified: Secondary | ICD-10-CM | POA: Diagnosis not present

## 2020-09-29 DIAGNOSIS — R262 Difficulty in walking, not elsewhere classified: Secondary | ICD-10-CM | POA: Diagnosis not present

## 2020-09-29 DIAGNOSIS — M545 Low back pain, unspecified: Secondary | ICD-10-CM | POA: Diagnosis not present

## 2020-09-29 DIAGNOSIS — Z9181 History of falling: Secondary | ICD-10-CM | POA: Diagnosis not present

## 2020-10-02 DIAGNOSIS — M6281 Muscle weakness (generalized): Secondary | ICD-10-CM | POA: Diagnosis not present

## 2020-10-02 DIAGNOSIS — K529 Noninfective gastroenteritis and colitis, unspecified: Secondary | ICD-10-CM | POA: Diagnosis not present

## 2020-10-02 DIAGNOSIS — R262 Difficulty in walking, not elsewhere classified: Secondary | ICD-10-CM | POA: Diagnosis not present

## 2020-10-02 DIAGNOSIS — Z9181 History of falling: Secondary | ICD-10-CM | POA: Diagnosis not present

## 2020-10-02 DIAGNOSIS — M5136 Other intervertebral disc degeneration, lumbar region: Secondary | ICD-10-CM | POA: Diagnosis not present

## 2020-10-02 DIAGNOSIS — M545 Low back pain, unspecified: Secondary | ICD-10-CM | POA: Diagnosis not present

## 2020-10-04 ENCOUNTER — Other Ambulatory Visit (HOSPITAL_COMMUNITY)
Admission: RE | Admit: 2020-10-04 | Discharge: 2020-10-04 | Disposition: A | Discharge status: Home / Self Care | Payer: Medicare Other | Source: Skilled Nursing Facility | Attending: Adult Health | Admitting: Adult Health

## 2020-10-04 ENCOUNTER — Non-Acute Institutional Stay (SKILLED_NURSING_FACILITY): Payer: Medicare Other | Admitting: Adult Health

## 2020-10-04 ENCOUNTER — Encounter: Payer: Self-pay | Admitting: Adult Health

## 2020-10-04 DIAGNOSIS — F0390 Unspecified dementia without behavioral disturbance: Secondary | ICD-10-CM | POA: Diagnosis not present

## 2020-10-04 DIAGNOSIS — E43 Unspecified severe protein-calorie malnutrition: Secondary | ICD-10-CM | POA: Diagnosis present

## 2020-10-04 DIAGNOSIS — K559 Vascular disorder of intestine, unspecified: Secondary | ICD-10-CM

## 2020-10-04 DIAGNOSIS — K828 Other specified diseases of gallbladder: Secondary | ICD-10-CM | POA: Diagnosis not present

## 2020-10-04 DIAGNOSIS — I700 Atherosclerosis of aorta: Secondary | ICD-10-CM

## 2020-10-04 DIAGNOSIS — K8020 Calculus of gallbladder without cholecystitis without obstruction: Secondary | ICD-10-CM | POA: Diagnosis not present

## 2020-10-04 LAB — ALBUMIN: Albumin: 3.2 g/dL — ABNORMAL LOW (ref 3.5–5.0)

## 2020-10-04 NOTE — Progress Notes (Signed)
Location:  [facility] Room Number: 156/W Place of Service:  SNF (31)   CODE STATUS: DNR  Allergies  Allergen Reactions  . Hctz [Hydrochlorothiazide]     hypokalemia    Chief Complaint  Patient presents with  . Medical Management of Chronic Issues          Dementia without behavioral disturbance unspecified dementia type:   Aortic atherosclerosis      Protein calorie malnutrition severe:    Ischemic colitis/biliary dyskinesia/ calculus of gall bladder without cholecystitis without obstruction:     HPI:  He is a 87 year old long term resident of this facility being seen for the management of his chronic illnesses:Dementia without behavioral disturbance unspecified dementia type:   Aortic atherosclerosis      Protein calorie malnutrition severe:    Ischemic colitis/biliary dyskinesia/ calculus of gall bladder without cholecystitis without obstruction. There are no reports of uncontrolled pain. His appetite is good. He has been doing well since starting voltaren gel. No reports of heart burn.     Past Medical History:  Diagnosis Date  . Anxiety   . Cancer [facility])    prostate  . Chronic chest wall pain   . Essential hypertension, benign 06/20/2016  . GAD (generalized anxiety disorder)   . GERD (gastroesophageal reflux disease)   . H/O echocardiogram 2016   normal  . Heart murmur    "leaking heart valve"  . High cholesterol 06/20/2016  . HOH (hard of hearing)    left  . Hx of falling   . Hyperlipidemia   . Interstitial pulmonary disease ([place])   . MDD (major depressive disorder)   . Murmur, cardiac   . Poor historian   . PUD (peptic ulcer disease)   . RLS (restless legs syndrome)   . Shortness of breath   . Wrist fracture 12/16/2011    Past Surgical History:  Procedure Laterality Date  . CHOLECYSTECTOMY N/A 06/28/2020   Procedure: LAPAROSCOPIC CHOLECYSTECTOMY;  Surgeon: [name], MD;  Location: [facility] OR[facility];  Service: General;  Laterality: N/A;   pt in [facility]  . COLONOSCOPY  2005   [facility]: Sigmoid colon diverticulosis, moderate sized external hemorrhoids. A few focal areas of mucosal erythema felt to be nonspecific.  Marland Kitchen ESOPHAGOGASTRODUODENOSCOPY    . ESOPHAGOGASTRODUODENOSCOPY N/A 07/18/2016   Rehman: normal exam except duodenal scar. h.pylori serologies negative  . FLEXIBLE SIGMOIDOSCOPY N/A 06/11/2020   Procedure: FLEXIBLE SIGMOIDOSCOPY;  Surgeon: Eloise Harman, DO;  Location: [facility]O SUITE;  Service: Endoscopy;  Laterality: N/A;  . HIP PINNING,CANNULATED Right 03/10/2020   Procedure: INTERNAL FIXATION RIGHT HIP;  Surgeon: Carole Civil, MD;  Location: [facility] ORS;  Service: Orthopedics;  Laterality: Right;  . ORIF WRIST FRACTURE  12/19/2011   Procedure: OPEN REDUCTION INTERNAL FIXATION (ORIF) WRIST FRACTURE;  Surgeon: Carole Civil, MD;  Location: [facility];  Service: Orthopedics;  Laterality: Right;  . prostate cancer     Diagnosed in the 90s  . TONSILLECTOMY      Social History   Socioeconomic History  . Marital status: Widowed    Spouse name: Not on file  . Number of children: Not on file  . Years of education: Not on file  . Highest education level: Not on file  Occupational History  . Occupation: retired    Fish farm manager: RETIRED  Tobacco Use  . Smoking status: Never Smoker  . Smokeless tobacco: Never Used  Vaping Use  . Vaping Use: Never used  Substance  and Sexual Activity  . Alcohol use: No  . Drug use: No  . Sexual activity: Not Currently  Other Topics Concern  . Not on file  Social History Narrative   Is long term resident of [facility]    Social Determinants of Health   Financial Resource Strain: Not on file  Food Insecurity: Not on file  Transportation Needs: Not on file  Physical Activity: Not on file  Stress: Not on file  Social Connections: Not on file  Intimate Partner Violence: Not on file   Family History  Problem Relation Age of Onset  . Cancer Other   . Heart attack Mother   . Cancer  Brother   . Heart attack Brother   . Heart disease Sister        by pass      VITAL SIGNS BP 140/64   Pulse 65   Temp (!) 97.2 F (36.2 C)   Ht 5\' 9"  (1.753 m)   Wt 176 lb 3.2 oz (79.9 kg)   BMI 26.02 kg/m   Outpatient Encounter Medications as of 10/04/2020  Medication Sig  . acetaminophen (TYLENOL) 500 MG tablet Take 500 mg by mouth every 6 (six) hours.  Marland Kitchen alfuzosin (UROXATRAL) 10 MG 24 hr tablet Take 10 mg by mouth daily with breakfast.  . Amino Acids-Protein Hydrolys (FEEDING SUPPLEMENT, PRO-STAT SUGAR FREE 64,) LIQD Take 30 mLs by mouth daily at 2 PM.  . Ascorbic Acid (VITAMIN C) 1000 MG tablet Take 1,000 mg by mouth daily.  Marland Kitchen aspirin 81 MG chewable tablet Chew 1 tablet (81 mg total) by mouth daily with breakfast.  . Balsam [place]-Castor Oil (VENELEX) OINT Special Instructions: Apply to bilateral buttocks & sacral area qshift for blanchable erythema.  . calcium carbonate (TUMS - DOSED IN MG ELEMENTAL CALCIUM) 500 MG chewable tablet Chew 1 tablet by mouth 2 (two) times daily as needed for indigestion or heartburn.   . diclofenac Sodium (VOLTAREN) 1 % GEL Apply 4 g topically 2 (two) times daily. Special Instructions: apply 4 gm to left hip twice daily for pain management.  Marland Kitchen diltiazem (CARDIZEM CD) 360 MG 24 hr capsule Take 1 capsule (360 mg total) by mouth daily.  . ferrous sulfate 325 (65 FE) MG tablet Take 325 mg by mouth daily with breakfast. For Anemia and  Iron Deficiency  . Flax Oil-Fish Oil-Borage Oil (FISH OIL-FLAX OIL-BORAGE OIL) CAPS Take 1 capsule by mouth daily.  . hydrALAZINE (APRESOLINE) 50 MG tablet Take 1 tablet (50 mg total) by mouth 3 (three) times daily.  . hydrocortisone cream 1 % Apply 1 application topically 2 (two) times daily.  Marland Kitchen ipratropium-albuterol (DUONEB) 0.5-2.5 (3) MG/3ML SOLN Take 3 mLs by nebulization every 6 (six) hours as needed.  . isosorbide mononitrate (IMDUR) 30 MG 24 hr tablet Take 1 tablet (30 mg total) by mouth daily.  Marland Kitchen lisinopril  (ZESTRIL) 40 MG tablet Take 1 tablet (40 mg total) by mouth daily.  Marland Kitchen loperamide (IMODIUM A-D) 2 MG tablet amt: 2mg ; oral Special Instructions: after each stool do not exceed more than 4tabs in 24 hours, As Needed  . Melatonin 10 MG TABS Take 10 mg by mouth at bedtime.  . memantine (NAMENDA) 10 MG tablet Take 10 mg by mouth 2 (two) times daily.  . methocarbamol (ROBAXIN) 500 MG tablet Take 500 mg by mouth daily in the afternoon.  . metoprolol tartrate (LOPRESSOR) 25 MG tablet Take 1 tablet (25 mg total) by mouth 2 (two) times daily.  . mirabegron  ER (MYRBETRIQ) 25 MG TB24 tablet Take 1 tablet (25 mg total) by mouth daily.  . NON FORMULARY Diet: Regular  . omeprazole (PRILOSEC) 20 MG capsule Take 1 capsule (20 mg total) by mouth daily before breakfast.  . ondansetron (ZOFRAN-ODT) 8 MG disintegrating tablet Take 8 mg by mouth 3 (three) times daily before meals. As needed for vomiting and nausea  . OXYGEN Inhale 2 L into the lungs continuous.  . polyethylene glycol (MIRALAX / GLYCOLAX) 17 g packet Take 17 g by mouth daily as needed.  . potassium chloride SA (KLOR-CON) 20 MEQ tablet Take 20 mEq by mouth 2 (two) times daily.  Marland Kitchen rOPINIRole (REQUIP) 0.25 MG tablet Take 0.25 mg by mouth at bedtime.  . salicylic acid 17 % gel Apply 1 application topically in the morning and at bedtime. Apply to warts to left hand  . senna-docusate (SENOKOT-S) 8.6-50 MG tablet Take 1 tablet by mouth at bedtime as needed for mild constipation.  . simethicone (MYLICON) 0000000 MG chewable tablet Chew 125 mg by mouth in the morning, at noon, in the evening, and at bedtime.  Marland Kitchen venlafaxine XR (EFFEXOR-XR) 150 MG 24 hr capsule Take 150 mg by mouth daily with breakfast.   No facility-administered encounter medications on file as of 10/04/2020.     SIGNIFICANT DIAGNOSTIC EXAMS   PREVIOUS   03-09-20: right hip x-ray: Minimally displaced right femoral neck fracture.  03-09-20: chest x-ray: Peribronchial thickening with streaky  perihilar infiltrates suggesting bronchitis or multifocal pneumonia.  03-12-20: renal ultrasound: No acute abnormality. Bilateral renal cysts  03-12-20: chest x-ray: Lower lung volumes. No acute cardiopulmonary abnormality.   05-22-20: chest x-ray:  1. Mild right basilar atelectasis. 2. Improved cardiomegaly from prior. Stable aortic atherosclerosis and tortuosity  05-22-20: ct of abdomen and pelvis:  Findings consistent with colitis involving the distal transverse colon, splenic flexure, and descending colon. This is likely ischemic or infectious in etiology. Diverticulosis is present but mostly distal to the area of thickening and diverticulitis is therefore less likely. 3 mm nonobstructing left renal calculus. Small bladder diverticula.  05-26-20: ct angio of abdomen and pelvis:  1. Persistent colitis involving distal transverse colon, splenic flexure, and descending colon. 2. Mild lower abdominal ascites, slightly increased on a. 3. 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 The celiac axis and superior mesenteric artery remain widely patent however, which typically allows adequate collateral perfusion. 4. Increase in small bilateral pleural effusions and dependent atelectasis/consolidation in both lung bases since previous study. 5. Sigmoid diverticulosis. 6. 1 mm mid left ureteral calculus with stable left renal pelvicaliectasis. 7. Right nephrolithiasis. 8. Bilateral renal artery ostial stenosis of possible hemodynamic significance.  9. Aortic Atherosclerosis  06-05-20: ct of abdomen and pelvis:  Mild colonic distension with air-fluid levels and segmental wall thickening likely reflects colitis. Additional stable findings detailed above.  06-09-20: acute abdomen:  Dilated and air-filled colon is noted suggesting possible ileus. No acute cardiopulmonary disease  06-09-20: ct of abdomen and pelvis:  Cholelithiasis. Haziness noted around the gallbladder. Cannot  exclude acute cholecystitis. Continued wall thickening in the left colon from the distal transverse colon through the distal descending colon concerning for colitis. Left colonic diverticulosis. Small bilateral pleural effusions, increasing since prior study. Bibasilar atelectasis. Coronary artery disease, aortic atherosclerosis.  06-09-20: abdominal ultrasound:  1. Moderate gallbladder sludge with probable punctate stones. Slight increased wall thickness with small amount of fluid adjacent to the gallbladder but negative sonographic Murphy. Findings are suggestive of but not definitive for cholecystitis;  consider correlation with nuclear medicine hepatobiliary imaging. 6 mm gallbladder polyp. 2. Simple appearing renal cysts. 3. Incidental note made of pleural effusions.  06-12-20: HIDA:  1. No scintigraphic evidence of acute cholecystitis. 2. Findings compatible with severe biliary dyskinesia with little to no gallbladder emptying and elicitation of abdominal pain with the CCK administration.  06-13-20: chest x-ray:  Diffuse interstitial opacity with Awanda Mink lines with possible trace pleural fluid. Stable heart size and aortic contours. Negative for air leak  NO NEW EXAMS.      LABS REVIEWED PREVIOUS    03-09-20: wbc 13.7; hgb 12.1; hct 37.7; mcv 80.4 plt 205; glucose 208; bun 21; creat 1.50; k+ 3.2; na++ 141; ca 9.2; hgb a1c 5.9; HIV: nr 03-10-20: wbc 11.2; hgb 11.3; hct 35.9; mcv 82.7 plt 188; glucose 160; bun 18; creat 1.21; k+ 3.3; na++ 139; ca 8.8 liver normal 3.0; mag 2.3 03-12-20: wbc 10.4; hgb 9.3; hct 29.6; mcv 84.3 plt 178; glucose 138; bun 34; creat 1.77; k+ 3.8; na++ 138; ca 8.3 03-14-20: glucose 134; bun 36; creat 1.23; k+ 3.2; na++ 139; ca 8.8 mag 2.1   03-16-20: hgb 10.7; hct 33.4 glucose 127; bun 18; creat 0.86 k+ 3.3; na++ 139; ca 9.2 ;liver normal albumin 2.5 iron 23; tibc 221; ferritin 137 03-23-20: k+ 3.0  03-27-20: glucose 133; bun 34; creat 0.85; k+ 3.6; na++ 142; ca 9.0;  AM cortisol: 10.6  05-22-20: wbc 19.4; hgb 14.6; hct 43.8; mcv 81.6 plt 254; glucose 211; bun 35; creat 1.20; k+ 3.0; na++ 138; ca 9.8; liver normal albumin 3.5 mag 1.9; urine culture: <10,000; blood culture: no growth 05-25-20: wbc 14.2; hgb 20.8; hct 34.8; mcv 84.7 plt 212; glucose 109; bun 24; creat 0.86; k+ 3.4; na++ 137; ca 8.9; mag 1.9 05-28-20: wbc 10.9; hgb 11.8; hct 36.8; mcv 81.8 plt 227; glucose 132; bun 10; creat 0.82; k+ 3.0; na++ 137; ca 8.7 06-01-20: wbc 12.2; hgb 12.5; hct 40.3; mcv 81.9 plt 136; glucose 136; bun 12; creat 0.81; k+ 3.0; na++ 140; ca 8.9 mag 1.9 06-05-20: wbc 13.6; hgb 10.2; hct 32.9; mcv 93.5 plt 266 glucose 125; bun 35; creat 1.26; k+ 3.5; na++ 137; ca 8.9  06-07-20: wbc 11.9; hgb 9.6; hct 29.8; mcv 82.8 plt 318; glucose 114; bun 42; creat 1.47; k+ 3.9; na++ 138; ca 8.8 GFR 46; liver normal albumin 2.1  06-08-20: glucose 107; bun 57; creat 1.81; k+ 4.3; na++ 135; ca 8.9 GFR 36 06-09-20: glucose 107; bun 52; creat 1.64; k+ 5.3; na++ 136; ca 9.3;  GFR 40; liver normal albumin 2.3 06-10-20: wbc 12.6; hgb 10.5; hct 34.2; mcv 83.4 plt 417; glucose 126; bun 39; creat 4.4; na++ 137; ca 9.5 GFR 58; liver normal albumin 2.2 06-12-20: wbc 8.2; hgb 10.4; hct 33.6; mcv 81.2 plt 364; glucose 123; bun 24; creat 0.87; k+ 3.5; na++ 138; GFR >60; liver normal albumin 1.9 06-13-20: urine culture: <10,000 colonies 06-14-20: wbc 9.0; hgb 10.5; hct 33.8; mcv 80.5 plt 267; glucose 122; bun 20; creat 0.97; k+3.3; na++ 140; ca 9.0 GFR >60 06-29-20: wbc 10.1; hgb 10.7; hct 36.1; mcv 83.0 plt 294; glucose 152; bun 26; creat 1.02; k+ 4.4; na++ 138 ca 8.8 GFR>60 07-24-20 PSA 16.0 08-14-20: glucose 110; bun 33; creat 1.00 ;k+ 2.5 na++ 140 08-15-20: glucose 114; bun 31; creat  0.84; k+ 3.1; na++ 142  TODAY  08-21-20: k+ 3.5 10-04-20: albumin 3.2    Review of Systems  Constitutional: Negative for malaise/fatigue.  Respiratory: Negative for cough and shortness  of breath.   Cardiovascular: Negative for  chest pain, palpitations and leg swelling.  Gastrointestinal: Negative for abdominal pain, constipation and heartburn.  Musculoskeletal: Negative for back pain, joint pain and myalgias.  Skin: Negative.   Neurological: Negative for dizziness.  Psychiatric/Behavioral: The patient is not nervous/anxious.    Physical Exam Constitutional:      General: He is not in acute distress.    Appearance: He is well-developed. He is not diaphoretic.  Neck:     Thyroid: No thyromegaly.  Cardiovascular:     Rate and Rhythm: Normal rate and regular rhythm.     Pulses: Normal pulses.     Heart sounds: Normal heart sounds.  Pulmonary:     Effort: Pulmonary effort is normal. No respiratory distress.     Breath sounds: Normal breath sounds.  Abdominal:     General: Bowel sounds are normal. There is no distension.     Palpations: Abdomen is soft.     Tenderness: There is no abdominal tenderness.  Musculoskeletal:        General: Normal range of motion.     Cervical back: Neck supple.     Right lower leg: No edema.     Left lower leg: No edema.     Comments: Status post right hip internal fixation 03-10-20     Lymphadenopathy:     Cervical: No cervical adenopathy.  Skin:    General: Skin is warm and dry.  Neurological:     Mental Status: He is alert and oriented to person, place, and time.  Psychiatric:        Mood and Affect: Mood normal.       ASSESSMENT/ PLAN:  TODAY  1. Dementia without behavioral disturbance unspecified dementia type: is stable weight is 176 pounds: will continue namenda 10 mg twice daily   2. Aortic atherosclerosis (ct 06-09-20) will continue asa 81 mg daily  3. Protein calorie malnutrition severe: has improved albumin 3.2 will lower prostat to 30 ML daily  4. Ischemic colitis/biliary dyskinesia/ calculus of gall bladder without cholecystitis without obstruction: is stable is status post cholecystectomy: 06-28-20. Will continue gas X 125 mg four times daily is on  regular diet.    PREVIOUS   5. Closed displaced fracture of right femoral neck: is status post right internal fixation 03-10-20 will continue tylenol 1 gm three times daily   6. Essential hypertension: is stable b/p 140/64   will conitne lopressor 25 mg twice daily cardizem cd 360 mg daily lisinopril 40 mg daily  7. Gastroesophageal reflux disease without esophagitis: is stable will continue prilosec 20 mg daily  8. Prostate cancer/overactive bladder: is stable will continue urosatral 10 mg daily is not on oxybutrin due to history of fracture. Has seen urology today will begin mybetric 25 mg daily   9.  hypokalemia: is stable k+3.5 will continue k+ 20 meq twice daily   10. Dyslipidemia: is stable will continue fish oil daily and is off zocor  11. Chronic constipation: is stable will continue miralax daily and senna s as needed  12. Major depression recurrent chronic: is stable will continue effexor xr 225 mg daily and melatonin 10 mg nightly for sleep.  13. RLS: is stable will continue requip 0.25 mg nightly   14. Iron deficiency due to dietary intake: is stable hgb 10.5 will continue iron daily  15. Interstitial lung disease: is stable has prn 02; and duoneb every 6 hours as needed.   16. Left hip pain: is stable  will continue voltaren gel 4 gm twice daily      Ok Edwards NP [facility]  Contact [phone] Monday through Friday 8am- 5pm  After hours call [phone]

## 2020-10-05 DIAGNOSIS — F4323 Adjustment disorder with mixed anxiety and depressed mood: Secondary | ICD-10-CM | POA: Diagnosis not present

## 2020-10-06 DIAGNOSIS — F411 Generalized anxiety disorder: Secondary | ICD-10-CM | POA: Diagnosis not present

## 2020-10-06 DIAGNOSIS — F321 Major depressive disorder, single episode, moderate: Secondary | ICD-10-CM | POA: Diagnosis not present

## 2020-10-06 DIAGNOSIS — G3184 Mild cognitive impairment, so stated: Secondary | ICD-10-CM | POA: Diagnosis not present

## 2020-10-12 DIAGNOSIS — F4323 Adjustment disorder with mixed anxiety and depressed mood: Secondary | ICD-10-CM | POA: Diagnosis not present

## 2020-10-19 ENCOUNTER — Other Ambulatory Visit (HOSPITAL_COMMUNITY)
Admission: RE | Admit: 2020-10-19 | Discharge: 2020-10-19 | Disposition: A | Discharge status: Home / Self Care | Payer: Medicare Other | Source: Skilled Nursing Facility | Attending: Internal Medicine | Admitting: Internal Medicine

## 2020-10-19 DIAGNOSIS — F4323 Adjustment disorder with mixed anxiety and depressed mood: Secondary | ICD-10-CM | POA: Diagnosis not present

## 2020-10-19 DIAGNOSIS — R972 Elevated prostate specific antigen [PSA]: Secondary | ICD-10-CM | POA: Insufficient documentation

## 2020-10-19 DIAGNOSIS — N179 Acute kidney failure, unspecified: Secondary | ICD-10-CM | POA: Insufficient documentation

## 2020-10-19 LAB — PSA: Prostatic Specific Antigen: 12.25 ng/mL — ABNORMAL HIGH (ref 0.00–4.00)

## 2020-10-23 ENCOUNTER — Ambulatory Visit (INDEPENDENT_AMBULATORY_CARE_PROVIDER_SITE_OTHER): Payer: Medicare Other | Admitting: Urology

## 2020-10-23 ENCOUNTER — Encounter: Payer: Self-pay | Admitting: Urology

## 2020-10-23 VITALS — BP 174/74 | HR 52 | Temp 97.5°F | Ht 69.0 in | Wt 174.0 lb

## 2020-10-23 DIAGNOSIS — C61 Malignant neoplasm of prostate: Secondary | ICD-10-CM | POA: Diagnosis not present

## 2020-10-23 LAB — MICROSCOPIC EXAMINATION
Bacteria, UA: NONE SEEN
Epithelial Cells (non renal): NONE SEEN /hpf (ref 0–10)
RBC, Urine: NONE SEEN /hpf (ref 0–2)
Renal Epithel, UA: NONE SEEN /hpf
WBC, UA: NONE SEEN /hpf (ref 0–5)

## 2020-10-23 LAB — URINALYSIS, ROUTINE W REFLEX MICROSCOPIC
Bilirubin, UA: NEGATIVE
Glucose, UA: NEGATIVE
Ketones, UA: NEGATIVE
Leukocytes,UA: NEGATIVE
Nitrite, UA: NEGATIVE
RBC, UA: NEGATIVE
Specific Gravity, UA: 1.02 (ref 1.005–1.030)
Urobilinogen, Ur: 0.2 mg/dL (ref 0.2–1.0)
pH, UA: 7 (ref 5.0–7.5)

## 2020-10-23 LAB — BLADDER SCAN AMB NON-IMAGING: Scan Result: 90

## 2020-10-23 MED ORDER — LEUPROLIDE ACETATE (6 MONTH) 45 MG ~~LOC~~ KIT
45.0000 mg | PACK | Freq: Once | SUBCUTANEOUS | Status: AC
  Administered 2020-10-23: 45 mg via SUBCUTANEOUS

## 2020-10-23 NOTE — Patient Instructions (Signed)
Hormone Suppression Therapy for Prostate Cancer  Hormone suppression therapy is a treatment that can help to slow the growth of cancer cells in the prostate gland. It is also called androgen deprivation therapy (ADT) or androgen suppression therapy. Hormone suppression therapy targets hormones in the body that help cancer cells grow-these hormones are called androgens. Hormone suppression therapy alone will not cure prostate cancer, but it can slow the growth of cancer cells and may shrink tumors over time. Your health care provider can help you find the best treatment that fits your lifestyle. Hormone suppression therapy may be used in the following cases:  When prostate cancer has spread too far to other places in the body and cannot be cured by surgery or radiation.  When a person has health problems that prevent them from having surgery or radiation.  Before radiation to help shrink the size of the cancer and make the radiation treatment more effective.  If the prostate cancer remains or comes back following treatment with surgery or radiation. What are the types of hormone suppression therapy? Orchiectomy Orchiectomy, also called surgical castration, is a surgery to remove one or both testicles. The testicles make the two main androgens-testosterone and dihydrotestosterone. This surgery reduces the levels of testosterone in the blood, leading to decreased androgen production. Medicine therapy Medicine therapy, also called medical or chemical castration, involves taking medicines to keep your body from making or using androgens. Medicines can do this in one of three ways: 1. Reducing androgen production by the testicles: ? Luteinizing hormone-releasing hormone (LHRH) agonist medicines. These medicines are injected or implanted under your skin to lower the amount of androgens that your testicles make. If you take these medicines, you may also be prescribed other medicines to help with side  effects. ? LHRH antagonist medicines. These medicines also work to lower the amount of androgens made in the testicles but they work faster than LHRH agonist medicines and have less severe side effects. They are given as a monthly injection under the skin, and they are used when prostate cancer is in an advanced stage. ? Estrogens (male hormones). These medicines help reduce androgen production by the testicles. Usually, other types of hormone suppression therapy are used first based. If they do not work well or if they stop working, then estrogen may be given. 2. Blocking androgen attachment throughout the body. ? Anti-androgen medicines, also called androgen receptor antagonists, block areas on the body where androgens attach. These are pills that are usually used in combination with other types of hormone suppression therapy, like orchiectomy and other medicines. 3. Blocking androgen production throughout the body. ? Androgen synthesis inhibitor medicines. These medicines help to stop other areas of the body from making androgens. They are taken as pills. They may be used if the prostate cancer is advanced and has not gotten better with surgery or other medicines. A steroid medicine may be given with this type of medicine to help with side effects. What are the risks? Hormone suppression therapy may cause side effects, including:  Hot flashes.  Sexual side effects: ? Decrease or lack of sexual desire. ? Decrease in size of penis or testicles. ? Inability to get an erection (erectile dysfunction, or impotence). ? Breast tenderness or increase in breast size.  Fatigue.  Weight gain.  Thinning of the bones (osteoporosis) or loss of muscle mass.  Depression.  Increased cholesterol levels.  Trouble with thinking or focusing. Hormone suppression therapy may also increase your risk of high blood pressure (  hypertension), stroke, heart attack, or diabetes. What are the benefits? One of the  main benefits of hormone suppression therapy is having additional treatment options. You may have only one type of treatment or two or more types at the same time. Treatments may be combined to:  Help with side effects.  Treat advanced cancer. Contact a health care provider if:  You have pain or side effects that do not get better with treatment.  You have trouble urinating.  You have new side effects that do not go away. Get help right away if:  You have severe chest pain.  You have trouble breathing.  You have an irregular heartbeat.  You have numbness or paralysis in the lower half of your body.  You are confused.  You have trouble talking or understanding speech. These symptoms may be an emergency. Do not wait to see if the symptoms will go away. Get medical help right away. Call your local emergency services (911 in the [place] Do not drive yourself to the hospital. Summary  Hormone suppression therapy is a treatment that can help to slow the growth of cancer cells in the prostate (prostate gland).  Hormone suppression therapy alone will not cure prostate cancer, but it can slow the growth of prostate cancer and may shrink tumors over time.  Treatment to suppress hormones may include surgery or medicines. This information is not intended to replace advice given to you by your health care provider. Make sure you discuss any questions you have with your health care provider. Document Revised: 04/22/2019 Document Reviewed: 04/22/2019 Elsevier Patient Education  2021 [employer]  

## 2020-10-23 NOTE — Progress Notes (Signed)
[facility] [place]    10/23/2020 10:03 AM   Jeffrey Sherman [id number]  Referring provider: Gerlene Fee, NP [place],  [place] [address]  No chief complaint on file.   HPI:  F/u -   1) BPH with LUTS - urinary frequency despite takingbothalfuzosin and terazosin. Off alfuzosin due to dizziness. He does c/o about his urinary frequency.Pt denies any recent UTIs, significant urinary incontinence, gross hematuria, or blood in his stool. AUASS is 15. Prostate was about 40 grams on CT in 2021. PVR = 100 ml.   Tried Myrbetriq 02/22. Not a lot of improvement.   2) PCa - dx in 2002 with Dr. Serita Butcher with PSA 22.7, Gleason 3+4=7, 3+3=6. No definitive therapy. On ADT until 2013 when he started IADT. He stopped ADT after a 2015 dose and PSA rose to 28.7 in March 2021 and 24 in May 2021. He received a 4 month Lupron in May 2021 and PSA did not respond - it remained stable at 24.4 in Aug 2021 with T  32. He was adamant about not restarting ADT and hasnt had any further. His PSA has declined. His PSA is lower at 16 in Feb 2022.   Staging: 5.28.2019: Bone/CT scan both showed no evidence of metastatic disease.  06/09/2020 PSA 24 - CT no evidence of mets  - I reviewed images   He requested ADT "shot". He wants a "6 month" shot. In the past ADT has helped his LUTS, so he is requesting it for symptomatic improvement. He asked about it last visit as well and we discussed rational for not using it then and again I went over the nature r/b/a to ADT> He was given Eligard 45 mg today, 05/22.    Today, seen for the above.    PMH: Past Medical History:  Diagnosis Date  . Anxiety   . Cancer [facility])    prostate  . Chronic chest wall pain   . Essential hypertension, benign 06/20/2016  . GAD (generalized anxiety disorder)   . GERD (gastroesophageal reflux disease)   . H/O echocardiogram 2016   normal  . Heart murmur    "leaking heart valve"  . High cholesterol 06/20/2016   . HOH (hard of hearing)    left  . Hx of falling   . Hyperlipidemia   . Interstitial pulmonary disease ([place])   . MDD (major depressive disorder)   . Murmur, cardiac   . Poor historian   . PUD (peptic ulcer disease)   . RLS (restless legs syndrome)   . Shortness of breath   . Wrist fracture 12/16/2011    Surgical History: Past Surgical History:  Procedure Laterality Date  . CHOLECYSTECTOMY N/A 06/28/2020   Procedure: LAPAROSCOPIC CHOLECYSTECTOMY;  Surgeon: [name], MD;  Location: [facility] ORS;  Service: General;  Laterality: N/A;  pt in [facility]  . COLONOSCOPY  2005   Rehman: Sigmoid colon diverticulosis, moderate sized external hemorrhoids. A few focal areas of mucosal erythema felt to be nonspecific.  Marland Kitchen ESOPHAGOGASTRODUODENOSCOPY    . ESOPHAGOGASTRODUODENOSCOPY N/A 07/18/2016   Rehman: normal exam except duodenal scar. h.pylori serologies negative  . FLEXIBLE SIGMOIDOSCOPY N/A 06/11/2020   Procedure: FLEXIBLE SIGMOIDOSCOPY;  Surgeon: Eloise Harman, DO;  Location: [facility] END[facility] SUITE;  Service: Endoscopy;  Laterality: N/A;  . HIP PINNING,CANNULATED Right 03/10/2020   Procedure: INTERNAL FIXATION RIGHT HIP;  Surgeon: Carole Civil, MD;  Location: [facility] ORS;  Service: Orthopedics;  Laterality: Right;  . ORIF WRIST FRACTURE  12/19/2011   Procedure: OPEN REDUCTION INTERNAL FIXATION (ORIF) WRIST FRACTURE;  Surgeon: Carole Civil, MD;  Location: [facility] ORS;  Service: Orthopedics;  Laterality: Right;  . prostate cancer     Diagnosed in the 90s  . TONSILLECTOMY      Home Medications:  Allergies as of 10/23/2020      Reactions   Hctz [hydrochlorothiazide]    hypokalemia      Medication List    Notice   This visit is during an admission. Changes to the med list made in this visit will be reflected in the After Visit Summary of the admission.     Allergies:  Allergies  Allergen Reactions  . Hctz [Hydrochlorothiazide]     hypokalemia    Family History: Family History   Problem Relation Age of Onset  . Cancer Other   . Heart attack Mother   . Cancer Brother   . Heart attack Brother   . Heart disease Sister        by pass    Social History:  reports that he has never smoked. He has never used smokeless tobacco. He reports that he does not drink alcohol and does not use drugs.   Physical Exam: There were no vitals taken for this visit.  Constitutional:  Alert and oriented, No acute distress. HEENT: [place] AT, moist mucus membranes.  Trachea midline, no masses. Cardiovascular: No clubbing, cyanosis, or edema. Respiratory: Normal respiratory effort, no increased work of breathing. GI: Abdomen is soft, nontender, nondistended, no abdominal masses GU: No CVA tenderness Skin: No rashes, bruises or suspicious lesions. Neurologic: Grossly intact, no focal deficits, moving all 4 extremities, in wheelchair  Psychiatric: Normal mood and affect.  Laboratory Data: Lab Results  Component Value Date   WBC 8.5 08/14/2020   HGB 12.5 (L) 08/14/2020   HCT 40.3 08/14/2020   MCV 79.3 (L) 08/14/2020   PLT 259 08/14/2020    Lab Results  Component Value Date   CREATININE 0.84 08/15/2020    Lab Results  Component Value Date   PSA 24.0 (H) 10/19/2019   PSA 28.7 (H) 08/25/2019    Lab Results  Component Value Date   TESTOSTERONE 123 (L) 08/14/2020    Lab Results  Component Value Date   HGBA1C 5.9 (H) 03/09/2020    Urinalysis    Component Value Date/Time   COLORURINE YELLOW 06/13/2020 2215   APPEARANCEUR Clear 07/31/2020 1058   LABSPEC 1.014 06/13/2020 2215   PHURINE 5.0 06/13/2020 2215   GLUCOSEU Negative 07/31/2020 1058   HGBUR NEGATIVE 06/13/2020 2215   BILIRUBINUR Negative 07/31/2020 1058   KETONESUR NEGATIVE 06/13/2020 2215   PROTEINUR 3+ (A) 07/31/2020 1058   PROTEINUR 30 (A) 06/13/2020 2215   UROBILINOGEN negative (A) 10/19/2019 0958   UROBILINOGEN 0.2 05/08/2014 2045   NITRITE Negative 07/31/2020 1058   NITRITE NEGATIVE 06/13/2020 2215    LEUKOCYTESUR Negative 07/31/2020 1058   LEUKOCYTESUR NEGATIVE 06/13/2020 2215    Lab Results  Component Value Date   LABMICR See below: 07/31/2020   WBCUA None seen 07/31/2020   LABEPIT None seen 07/31/2020   MUCUS Present 07/31/2020   BACTERIA None seen 07/31/2020    Results for orders placed in visit on 10/09/00  DG Abd 1 View  Narrative FINDINGS CLINICAL DATA:  87 YEAR OLD MALE WITH RENAL CALCULI, ABDOMINAL PAIN. SINGLE VIEW ABDOMEN - 10/09/00: FINDINGS:  A SINGLE FRONTAL VIEW OF THE ABDOMEN AND PELVIS DEMONSTRATES UNREMARKABLE BOWEL GAS PATTERN.  DEFINITE CALCIFICATIONS ARE NOTED OVERLYING THE  RENAL SHADOWS OR EXPECTED COURSE OF THE URETERS BUT PLEASE NOTE THAT STOOL IN THE COLONIC GAS DECREASE SENSITIVITY, ESPECIALLY OVER THE RENAL SHADOWS.   MINIMAL DEGENERATIVE CHANGE IN THE LOWER LUMBAR SPINE ARE NOTED. IMPRESSION NO DEFINITE EVIDENCE OF CALCIFICATIONS OVERLYING THE ABDOMEN/PELVIS, BUT SOMEWHAT LIMITED SENSITIVITY OVER THE RENAL SHADOWS SECONDARY TO OVERLYING COLONIC BOWEL GAS/STOOL.  No results found for this or any previous visit.  No results found for this or any previous visit.  No results found for this or any previous visit.  Results for orders placed during the hospital encounter of 03/09/20  US RENAL  Narrative CLINICAL DATA:  Acute kidney injury  EXAM: RENAL / URINARY TRACT ULTRASOUND COMPLETE  COMPARISON:  Abdominal ultrasound 04/21/2019  FINDINGS: Right Kidney:  Renal measurements: 12.5 x 5.0 x 4.3 cm = volume: 141 mL. Echogenicity within normal limits. Multiple cysts, largest measuring 6.6 cm. No solid mass. No hydronephrosis.  Left Kidney:  Renal measurements: 11.8 x 5.6 x 5.5 cm = volume: 189 mL. Echogenicity within normal limits. Large cyst measuring 6.1 cm. No solid mass. No hydronephrosis.  Bladder:  Decompressed.  Other:  None.  IMPRESSION: No acute abnormality.  Bilateral renal cysts.   Electronically Signed By:  Audie Pinto M.D. On: 03/12/2020 12:06  No results found for this or any previous visit.  No results found for this or any previous visit.  No results found for this or any previous visit.   Assessment & Plan:    PCa - we discussed again the nature r/b/a to ADT and he elects to proceed. He was given  F/u - 6 mo with T and PSA prior    Frequency, urgency - stable   No follow-ups on file.  Festus Aloe, MD

## 2020-10-23 NOTE — Progress Notes (Signed)
post void residual=90  Urological Symptom Review  Patient is experiencing the following symptoms: none   Review of Systems  Gastrointestinal (upper)  : Indigestion/heartburn  Gastrointestinal (lower) : Constipation  Constitutional : Negative for symptoms  Skin: Negative for skin symptoms  Eyes: Negative for eye symptoms  Ear/Nose/Throat : Sinus problems  Hematologic/Lymphatic: Easy bruising  Cardiovascular : Leg swelling  Respiratory : Negative for respiratory symptoms  Endocrine: Excessive thirst  Musculoskeletal: Back pain  Neurological: Negative for neurological symptoms  Psychologic: Negative for psychiatric symptoms   Eligard SubQ Injection   Due to Prostate Cancer patient is present today for a Eligard Injection.  Medication: Eligard 6 month Dose: 45 mg  Location: left  Lot: [id number] Exp: 02/2022  Patient tolerated well, no complications were noted  Performed by: [place], LPN

## 2020-10-26 DIAGNOSIS — F4323 Adjustment disorder with mixed anxiety and depressed mood: Secondary | ICD-10-CM | POA: Diagnosis not present

## 2020-10-30 ENCOUNTER — Encounter: Payer: Self-pay | Admitting: Adult Health

## 2020-10-30 ENCOUNTER — Non-Acute Institutional Stay (SKILLED_NURSING_FACILITY): Payer: Medicare Other | Admitting: Adult Health

## 2020-10-30 DIAGNOSIS — N3281 Overactive bladder: Secondary | ICD-10-CM

## 2020-10-30 DIAGNOSIS — C61 Malignant neoplasm of prostate: Secondary | ICD-10-CM

## 2020-10-30 DIAGNOSIS — E876 Hypokalemia: Secondary | ICD-10-CM

## 2020-10-30 DIAGNOSIS — K219 Gastro-esophageal reflux disease without esophagitis: Secondary | ICD-10-CM | POA: Diagnosis not present

## 2020-10-30 DIAGNOSIS — I10 Essential (primary) hypertension: Secondary | ICD-10-CM | POA: Diagnosis not present

## 2020-10-30 NOTE — Progress Notes (Signed)
Location:  [facility] Room Number: 156-D Place of Service:  SNF (31)   CODE STATUS: DNR  Allergies  Allergen Reactions  . Hctz [Hydrochlorothiazide]     hypokalemia    Chief Complaint  Patient presents with  . Medical Management of Chronic Issues          Essential hypertension:   Gastroesophageal reflux disease without esophagitis:    Prostate cancer/overactive bladder:    Hypokalemia:    HPI:  He is a 87 year old long term resident of this facility being seen for the management of his chronic illnesses: Essential hypertension:   Gastroesophageal reflux disease without esophagitis:    Prostate cancer/overactive bladder:    Hypokalemia.  There are no reports of uncontrolled pain; no changes in appetite; no heart burn.    Past Medical History:  Diagnosis Date  . Anxiety   . Cancer [facility])    prostate  . Chronic chest wall pain   . Essential hypertension, benign 06/20/2016  . GAD (generalized anxiety disorder)   . GERD (gastroesophageal reflux disease)   . H/O echocardiogram 2016   normal  . Heart murmur    "leaking heart valve"  . High cholesterol 06/20/2016  . HOH (hard of hearing)    left  . Hx of falling   . Hyperlipidemia   . Interstitial pulmonary disease ([place])   . MDD (major depressive disorder)   . Murmur, cardiac   . Poor historian   . PUD (peptic ulcer disease)   . RLS (restless legs syndrome)   . Shortness of breath   . Wrist fracture 12/16/2011    Past Surgical History:  Procedure Laterality Date  . CHOLECYSTECTOMY N/A 06/28/2020   Procedure: LAPAROSCOPIC CHOLECYSTECTOMY;  Surgeon: [name], MD;  Location: [facility] ORS;  Service: General;  Laterality: N/A;  pt in [facility]  . COLONOSCOPY  2005   Rehman: Sigmoid colon diverticulosis, moderate sized external hemorrhoids. A few focal areas of mucosal erythema felt to be nonspecific.  Marland Kitchen ESOPHAGOGASTRODUODENOSCOPY    . ESOPHAGOGASTRODUODENOSCOPY N/A 07/18/2016   Rehman: normal exam  except duodenal scar. h.pylori serologies negative  . FLEXIBLE SIGMOIDOSCOPY N/A 06/11/2020   Procedure: FLEXIBLE SIGMOIDOSCOPY;  Surgeon: Eloise Harman, DO;  Location: [facility] ENDO SUITE;  Service: Endoscopy;  Laterality: N/A;  . HIP PINNING,CANNULATED Right 03/10/2020   Procedure: INTERNAL FIXATION RIGHT HIP;  Surgeon: Carole Civil, MD;  Location: [facility] ORS;  Service: Orthopedics;  Laterality: Right;  . ORIF WRIST FRACTURE  12/19/2011   Procedure: OPEN REDUCTION INTERNAL FIXATION (ORIF) WRIST FRACTURE;  Surgeon: Carole Civil, MD;  Location: [facility] ORS;  Service: Orthopedics;  Laterality: Right;  . prostate cancer     Diagnosed in the 90s  . TONSILLECTOMY      Social History   Socioeconomic History  . Marital status: Widowed    Spouse name: Not on file  . Number of children: Not on file  . Years of education: Not on file  . Highest education level: Not on file  Occupational History  . Occupation: retired    Fish farm manager: RETIRED  Tobacco Use  . Smoking status: Never Smoker  . Smokeless tobacco: Never Used  Vaping Use  . Vaping Use: Never used  Substance and Sexual Activity  . Alcohol use: No  . Drug use: No  . Sexual activity: Not Currently  Other Topics Concern  . Not on file  Social History Narrative   Is long term resident of [facility]  Social Determinants of Health   Financial Resource Strain: Not on file  Food Insecurity: Not on file  Transportation Needs: Not on file  Physical Activity: Not on file  Stress: Not on file  Social Connections: Not on file  Intimate Partner Violence: Not on file   Family History  Problem Relation Age of Onset  . Cancer Other   . Heart attack Mother   . Cancer Brother   . Heart attack Brother   . Heart disease Sister        by pass      VITAL SIGNS BP (!) 143/74   Pulse (!) 51   Temp 97.6 F (36.4 C)   Resp 18   Ht 5\' 9"  (1.753 m)   Wt 176 lb 3.2 oz (79.9 kg)   SpO2 98%   BMI 26.02 kg/m   Outpatient Encounter  Medications as of 10/30/2020  Medication Sig  . acetaminophen (TYLENOL) 500 MG tablet Take 500 mg by mouth every 6 (six) hours.  Marland Kitchen alfuzosin (UROXATRAL) 10 MG 24 hr tablet Take 10 mg by mouth daily with breakfast.  . Amino Acids-Protein Hydrolys (FEEDING SUPPLEMENT, PRO-STAT SUGAR FREE 64,) LIQD Take 30 mLs by mouth daily at 2 PM.  . Ascorbic Acid (VITAMIN C) 1000 MG tablet Take 1,000 mg by mouth daily.  Marland Kitchen aspirin 81 MG chewable tablet Chew 1 tablet (81 mg total) by mouth daily with breakfast.  . Balsam [place]-Castor Oil (VENELEX) OINT Special Instructions: Apply to bilateral buttocks & sacral area qshift for blanchable erythema.  . calcium carbonate (TUMS - DOSED IN MG ELEMENTAL CALCIUM) 500 MG chewable tablet Chew 1 tablet by mouth 2 (two) times daily as needed for indigestion or heartburn.   . diclofenac Sodium (VOLTAREN) 1 % GEL Apply 4 g topically 2 (two) times daily. Special Instructions: apply 4 gm to left hip twice daily for pain management.  Marland Kitchen diltiazem (CARDIZEM CD) 360 MG 24 hr capsule Take 1 capsule (360 mg total) by mouth daily.  . ferrous sulfate 325 (65 FE) MG tablet Take 325 mg by mouth daily with breakfast. For Anemia and  Iron Deficiency  . Flax Oil-Fish Oil-Borage Oil (FISH OIL-FLAX OIL-BORAGE OIL) CAPS Take 1 capsule by mouth daily.  . hydrALAZINE (APRESOLINE) 50 MG tablet Take 1 tablet (50 mg total) by mouth 3 (three) times daily.  . hydrocortisone cream 1 % Apply 1 application topically 2 (two) times daily.  Marland Kitchen ipratropium-albuterol (DUONEB) 0.5-2.5 (3) MG/3ML SOLN Take 3 mLs by nebulization every 6 (six) hours as needed.  . isosorbide mononitrate (IMDUR) 30 MG 24 hr tablet Take 1 tablet (30 mg total) by mouth daily.  Marland Kitchen lisinopril (ZESTRIL) 40 MG tablet Take 1 tablet (40 mg total) by mouth daily.  Marland Kitchen loperamide (IMODIUM A-D) 2 MG tablet amt: 2mg ; oral Special Instructions: after each stool do not exceed more than 4tabs in 24 hours, As Needed  . Melatonin 10 MG TABS Take 10 mg by  mouth at bedtime.  . memantine (NAMENDA) 10 MG tablet Take 10 mg by mouth 2 (two) times daily.  . methocarbamol (ROBAXIN) 500 MG tablet Take 500 mg by mouth daily in the afternoon.  . metoprolol tartrate (LOPRESSOR) 25 MG tablet Take 1 tablet (25 mg total) by mouth 2 (two) times daily.  . mirabegron ER (MYRBETRIQ) 25 MG TB24 tablet Take 1 tablet (25 mg total) by mouth daily.  . NON FORMULARY Diet: Regular  . omeprazole (PRILOSEC) 20 MG capsule Take 1 capsule (20 mg total)  by mouth daily before breakfast.  . ondansetron (ZOFRAN-ODT) 8 MG disintegrating tablet Take 8 mg by mouth 3 (three) times daily before meals. As needed for vomiting and nausea  . OXYGEN Inhale 2 L into the lungs continuous.  . polyethylene glycol (MIRALAX / GLYCOLAX) 17 g packet Take 17 g by mouth daily as needed.  . potassium chloride SA (KLOR-CON) 20 MEQ tablet Take 20 mEq by mouth 2 (two) times daily.  Marland Kitchen rOPINIRole (REQUIP) 0.25 MG tablet Take 0.25 mg by mouth at bedtime.  . salicylic acid 17 % gel Apply 1 application topically in the morning and at bedtime. Apply to warts to left hand  . senna-docusate (SENOKOT-S) 8.6-50 MG tablet Take 1 tablet by mouth at bedtime as needed for mild constipation.  . simethicone (GAS RELIEF EXTRA STRENGTH) 125 MG chewable tablet Chew 125 mg by mouth in the morning, at noon, in the evening, and at bedtime.  Marland Kitchen venlafaxine XR (EFFEXOR-XR) 150 MG 24 hr capsule Take 150 mg by mouth daily with breakfast.  . [DISCONTINUED] simethicone (MYLICON) 756 MG chewable tablet Chew 125 mg by mouth in the morning, at noon, in the evening, and at bedtime. (Patient not taking: Reported on 10/30/2020)  . [DISCONTINUED] verapamil (CALAN-SR) 240 MG CR tablet  (Patient not taking: Reported on 10/30/2020)   No facility-administered encounter medications on file as of 10/30/2020.     SIGNIFICANT DIAGNOSTIC EXAMS   PREVIOUS   06-09-20: ct of abdomen and pelvis:  Cholelithiasis. Haziness noted around the  gallbladder. Cannot exclude acute cholecystitis. Continued wall thickening in the left colon from the distal transverse colon through the distal descending colon concerning for colitis. Left colonic diverticulosis. Small bilateral pleural effusions, increasing since prior study. Bibasilar atelectasis. Coronary artery disease, aortic atherosclerosis.  06-09-20: abdominal ultrasound:  1. Moderate gallbladder sludge with probable punctate stones. Slight increased wall thickness with small amount of fluid adjacent to the gallbladder but negative sonographic Murphy. Findings are suggestive of but not definitive for cholecystitis; consider correlation with nuclear medicine hepatobiliary imaging. 6 mm gallbladder polyp. 2. Simple appearing renal cysts. 3. Incidental note made of pleural effusions.  06-12-20: HIDA:  1. No scintigraphic evidence of acute cholecystitis. 2. Findings compatible with severe biliary dyskinesia with little to no gallbladder emptying and elicitation of abdominal pain with the CCK administration.  06-13-20: chest x-ray:  Diffuse interstitial opacity with Awanda Mink lines with possible trace pleural fluid. Stable heart size and aortic contours. Negative for air leak  NO NEW EXAMS.      LABS REVIEWED PREVIOUS    03-09-20: wbc 13.7; hgb 12.1; hct 37.7; mcv 80.4 plt 205; glucose 208; bun 21; creat 1.50; k+ 3.2; na++ 141; ca 9.2; hgb a1c 5.9; HIV: nr 03-10-20: wbc 11.2; hgb 11.3; hct 35.9; mcv 82.7 plt 188; glucose 160; bun 18; creat 1.21; k+ 3.3; na++ 139; ca 8.8 liver normal 3.0; mag 2.3 03-12-20: wbc 10.4; hgb 9.3; hct 29.6; mcv 84.3 plt 178; glucose 138; bun 34; creat 1.77; k+ 3.8; na++ 138; ca 8.3 03-14-20: glucose 134; bun 36; creat 1.23; k+ 3.2; na++ 139; ca 8.8 mag 2.1   03-16-20: hgb 10.7; hct 33.4 glucose 127; bun 18; creat 0.86 k+ 3.3; na++ 139; ca 9.2 ;liver normal albumin 2.5 iron 23; tibc 221; ferritin 137 03-23-20: k+ 3.0  03-27-20: glucose 133; bun 34; creat 0.85; k+  3.6; na++ 142; ca 9.0; AM cortisol: 10.6  05-22-20: wbc 19.4; hgb 14.6; hct 43.8; mcv 81.6 plt 254; glucose 211; bun 35; creat  1.20; k+ 3.0; na++ 138; ca 9.8; liver normal albumin 3.5 mag 1.9; urine culture: <10,000; blood culture: no growth 05-25-20: wbc 14.2; hgb 20.8; hct 34.8; mcv 84.7 plt 212; glucose 109; bun 24; creat 0.86; k+ 3.4; na++ 137; ca 8.9; mag 1.9 05-28-20: wbc 10.9; hgb 11.8; hct 36.8; mcv 81.8 plt 227; glucose 132; bun 10; creat 0.82; k+ 3.0; na++ 137; ca 8.7 06-01-20: wbc 12.2; hgb 12.5; hct 40.3; mcv 81.9 plt 136; glucose 136; bun 12; creat 0.81; k+ 3.0; na++ 140; ca 8.9 mag 1.9 06-05-20: wbc 13.6; hgb 10.2; hct 32.9; mcv 93.5 plt 266 glucose 125; bun 35; creat 1.26; k+ 3.5; na++ 137; ca 8.9  06-07-20: wbc 11.9; hgb 9.6; hct 29.8; mcv 82.8 plt 318; glucose 114; bun 42; creat 1.47; k+ 3.9; na++ 138; ca 8.8 GFR 46; liver normal albumin 2.1  06-08-20: glucose 107; bun 57; creat 1.81; k+ 4.3; na++ 135; ca 8.9 GFR 36 06-09-20: glucose 107; bun 52; creat 1.64; k+ 5.3; na++ 136; ca 9.3;  GFR 40; liver normal albumin 2.3 06-10-20: wbc 12.6; hgb 10.5; hct 34.2; mcv 83.4 plt 417; glucose 126; bun 39; creat 4.4; na++ 137; ca 9.5 GFR 58; liver normal albumin 2.2 06-12-20: wbc 8.2; hgb 10.4; hct 33.6; mcv 81.2 plt 364; glucose 123; bun 24; creat 0.87; k+ 3.5; na++ 138; GFR >60; liver normal albumin 1.9 06-13-20: urine culture: <10,000 colonies 06-14-20: wbc 9.0; hgb 10.5; hct 33.8; mcv 80.5 plt 267; glucose 122; bun 20; creat 0.97; k+3.3; na++ 140; ca 9.0 GFR >60 06-29-20: wbc 10.1; hgb 10.7; hct 36.1; mcv 83.0 plt 294; glucose 152; bun 26; creat 1.02; k+ 4.4; na++ 138 ca 8.8 GFR>60 07-24-20 PSA 16.0 08-14-20: glucose 110; bun 33; creat 1.00 ;k+ 2.5 na++ 140 08-15-20: glucose 114; bun 31; creat  0.84; k+ 3.1; na++ 142 08-21-20: k+ 3.5 10-04-20: albumin 3.2   TODAY  10-19-20: PSA 12.25 (13.05)   Review of Systems  Constitutional: Negative for malaise/fatigue.  Respiratory: Negative for cough and  shortness of breath.   Cardiovascular: Negative for chest pain, palpitations and leg swelling.  Gastrointestinal: Negative for abdominal pain, constipation and heartburn.  Musculoskeletal: Negative for back pain, joint pain and myalgias.  Skin: Negative.   Neurological: Negative for dizziness.  Psychiatric/Behavioral: The patient is not nervous/anxious.      Physical Exam Constitutional:      General: He is not in acute distress.    Appearance: He is well-developed. He is not diaphoretic.  Neck:     Thyroid: No thyromegaly.  Cardiovascular:     Rate and Rhythm: Normal rate and regular rhythm.     Pulses: Normal pulses.     Heart sounds: Normal heart sounds.  Pulmonary:     Effort: Pulmonary effort is normal. No respiratory distress.     Breath sounds: Normal breath sounds.  Abdominal:     General: Bowel sounds are normal. There is no distension.     Palpations: Abdomen is soft.     Tenderness: There is no abdominal tenderness.  Musculoskeletal:        General: Normal range of motion.     Cervical back: Neck supple.     Right lower leg: No edema.     Left lower leg: No edema.     Comments: Status post right hip internal fixation 03-10-20  Lymphadenopathy:     Cervical: No cervical adenopathy.  Skin:    General: Skin is warm and dry.  Neurological:  Mental Status: He is alert and oriented to person, place, and time.  Psychiatric:        Mood and Affect: Mood normal.      ASSESSMENT/ PLAN:  TODAY  1. Essential hypertension: is stable b/p 143/74 will continue lopressor 25 mg twice daily cardizem cd 360 mg daily lisinopril 40 mg daily   2. Gastroesophageal reflux disease without esophagitis: is stable will continue prilosec 20 mg daily   3. Prostate cancer/overactive bladder: is stable will continue urosatral 10 mg daily no oxybutrin due to history of fracture; is followed by urology; will continue mybertriq 25 mg daily    4. Hypokalemia: is stable k+ 3.5 will  continue k+ 20 meq twice daily    PREVIOUS   5. Dyslipidemia: is stable will continue fish oil daily and is off zocor  6. Chronic constipation: is stable will continue miralax daily and senna s as needed  7. Major depression recurrent chronic: is stable will continue effexor xr 225 mg daily and melatonin 10 mg nightly for sleep.  8. RLS: is stable will continue requip 0.25 mg nightly   9. Iron deficiency due to dietary intake: is stable hgb 10.5 will continue iron daily  10. Interstitial lung disease: is stable has prn 02; and duoneb every 6 hours as needed.   11. Left hip pain: is stable will continue voltaren gel 4 gm twice daily   12. Dementia without behavioral disturbance unspecified dementia type: is stable weight is 176 pounds: will continue namenda 10 mg twice daily   13. Aortic atherosclerosis (ct 06-09-20) will continue asa 81 mg daily  14. Protein calorie malnutrition severe: has improved albumin 3.2 will continue prostat  30 ML daily  15. Ischemic colitis/biliary dyskinesia/ calculus of gall bladder without cholecystitis without obstruction: is stable is status post cholecystectomy: 06-28-20. Will continue gas X 125 mg four times daily is on regular diet.       Ok Edwards NP [facility]  Contact [phone] Monday through Friday 8am- 5pm  After hours call [phone]

## 2020-10-31 DIAGNOSIS — F321 Major depressive disorder, single episode, moderate: Secondary | ICD-10-CM | POA: Diagnosis not present

## 2020-10-31 DIAGNOSIS — F411 Generalized anxiety disorder: Secondary | ICD-10-CM | POA: Diagnosis not present

## 2020-10-31 DIAGNOSIS — G3184 Mild cognitive impairment, so stated: Secondary | ICD-10-CM | POA: Diagnosis not present

## 2020-11-02 ENCOUNTER — Encounter: Payer: Self-pay | Admitting: Adult Health

## 2020-11-02 ENCOUNTER — Non-Acute Institutional Stay (SKILLED_NURSING_FACILITY): Payer: Medicare Other | Admitting: Adult Health

## 2020-11-02 DIAGNOSIS — N1832 Chronic kidney disease, stage 3b: Secondary | ICD-10-CM

## 2020-11-02 DIAGNOSIS — J849 Interstitial pulmonary disease, unspecified: Secondary | ICD-10-CM | POA: Diagnosis not present

## 2020-11-02 DIAGNOSIS — F4323 Adjustment disorder with mixed anxiety and depressed mood: Secondary | ICD-10-CM | POA: Diagnosis not present

## 2020-11-02 DIAGNOSIS — I700 Atherosclerosis of aorta: Secondary | ICD-10-CM

## 2020-11-02 DIAGNOSIS — F0390 Unspecified dementia without behavioral disturbance: Secondary | ICD-10-CM

## 2020-11-02 NOTE — Progress Notes (Signed)
Location:  [place] Room Number: 156-D Place of Service:  SNF (31)   CODE STATUS: DNR  Allergies  Allergen Reactions  . Hctz [Hydrochlorothiazide]     hypokalemia    Chief Complaint  Patient presents with  . Acute Visit    Care plan meeting.    HPI:  We have come together for his care plan meeting. 15/15 BIMS: mood 3/30: poor sleep.  He is independent to supervision for his adls. He is continent of bladder and bowel. He is able to feed himself. Uses front wheel walker; there have been no falls. Therapy none   Dietary regular: good appetite; protein supplement weight 176 pounds is down over the past 6 months.   He continues to be followed for his chronic illnesses including: Aortic atherosclerosis   Dementia without behavioral disturbance unspecified dementia type   Interstitial lung disease   Stage 3b chronic kidney disease   Past Medical History:  Diagnosis Date  . Anxiety   . Cancer [facility])    prostate  . Chronic chest wall pain   . Essential hypertension, benign 06/20/2016  . GAD (generalized anxiety disorder)   . GERD (gastroesophageal reflux disease)   . H/O echocardiogram 2016   normal  . Heart murmur    "leaking heart valve"  . High cholesterol 06/20/2016  . HOH (hard of hearing)    left  . Hx of falling   . Hyperlipidemia   . Interstitial pulmonary disease ([place])   . MDD (major depressive disorder)   . Murmur, cardiac   . Poor historian   . PUD (peptic ulcer disease)   . RLS (restless legs syndrome)   . Shortness of breath   . Wrist fracture 12/16/2011    Past Surgical History:  Procedure Laterality Date  . CHOLECYSTECTOMY N/A 06/28/2020   Procedure: LAPAROSCOPIC CHOLECYSTECTOMY;  Surgeon: [name], MD;  Location: [facility] ORS;  Service: General;  Laterality: N/A;  pt in [facility]  . COLONOSCOPY  2005   Rehman: Sigmoid colon diverticulosis, moderate sized external hemorrhoids. A few focal areas of mucosal erythema felt to be  nonspecific.  Marland Kitchen ESOPHAGOGASTRODUODENOSCOPY    . ESOPHAGOGASTRODUODENOSCOPY N/A 07/18/2016   Rehman: normal exam except duodenal scar. h.pylori serologies negative  . FLEXIBLE SIGMOIDOSCOPY N/A 06/11/2020   Procedure: FLEXIBLE SIGMOIDOSCOPY;  Surgeon: Eloise Harman, DO;  Location: [facility] ENDO SUITE;  Service: Endoscopy;  Laterality: N/A;  . HIP PINNING,CANNULATED Right 03/10/2020   Procedure: INTERNAL FIXATION RIGHT HIP;  Surgeon: Carole Civil, MD;  Location: [facility] ORS;  Service: Orthopedics;  Laterality: Right;  . ORIF WRIST FRACTURE  12/19/2011   Procedure: OPEN REDUCTION INTERNAL FIXATION (ORIF) WRIST FRACTURE;  Surgeon: Carole Civil, MD;  Location: [facility] OR[facility];  Service: Orthopedics;  Laterality: Right;  . prostate cancer     Diagnosed in the 90s  . TONSILLECTOMY      Social History   Socioeconomic History  . Marital status: Widowed    Spouse name: Not on file  . Number of children: Not on file  . Years of education: Not on file  . Highest education level: Not on file  Occupational History  . Occupation: retired    Fish farm manager: RETIRED  Tobacco Use  . Smoking status: Never Smoker  . Smokeless tobacco: Never Used  Vaping Use  . Vaping Use: Never used  Substance and Sexual Activity  . Alcohol use: No  . Drug use: No  . Sexual activity: Not Currently  Other  Topics Concern  . Not on file  Social History Narrative   Is long term resident of [facility]    Social Determinants of Health   Financial Resource Strain: Not on file  Food Insecurity: Not on file  Transportation Needs: Not on file  Physical Activity: Not on file  Stress: Not on file  Social Connections: Not on file  Intimate Partner Violence: Not on file   Family History  Problem Relation Age of Onset  . Cancer Other   . Heart attack Mother   . Cancer Brother   . Heart attack Brother   . Heart disease Sister        by pass      VITAL SIGNS BP (!) 146/78   Pulse (!) 51   Temp 97.7 F (36.5 C)   Resp 18    Ht 5\' 9"  (1.753 m)   Wt 176 lb 3.2 oz (79.9 kg)   SpO2 98%   BMI 26.02 kg/m   Outpatient Encounter Medications as of 11/02/2020  Medication Sig  . acetaminophen (TYLENOL) 500 MG tablet Take 500 mg by mouth every 6 (six) hours.  Marland Kitchen alfuzosin (UROXATRAL) 10 MG 24 hr tablet Take 10 mg by mouth daily with breakfast.  . Amino Acids-Protein Hydrolys (FEEDING SUPPLEMENT, PRO-STAT SUGAR FREE 64,) LIQD Take 30 mLs by mouth daily at 2 PM.  . Ascorbic Acid (VITAMIN C) 1000 MG tablet Take 1,000 mg by mouth daily.  Marland Kitchen aspirin 81 MG chewable tablet Chew 1 tablet (81 mg total) by mouth daily with breakfast.  . Balsam [place]-Castor Oil (VENELEX) OINT Special Instructions: Apply to bilateral buttocks & sacral area qshift for blanchable erythema.  . calcium carbonate (TUMS - DOSED IN MG ELEMENTAL CALCIUM) 500 MG chewable tablet Chew 1 tablet by mouth 2 (two) times daily as needed for indigestion or heartburn.   . diclofenac Sodium (VOLTAREN) 1 % GEL Apply 4 g topically 2 (two) times daily. Special Instructions: apply 4 gm to left hip twice daily for pain management.  Marland Kitchen diltiazem (CARDIZEM CD) 360 MG 24 hr capsule Take 1 capsule (360 mg total) by mouth daily.  . ferrous sulfate 325 (65 FE) MG tablet Take 325 mg by mouth daily with breakfast. For Anemia and  Iron Deficiency  . Flax Oil-Fish Oil-Borage Oil (FISH OIL-FLAX OIL-BORAGE OIL) CAPS Take 1 capsule by mouth daily.  . hydrALAZINE (APRESOLINE) 50 MG tablet Take 1 tablet (50 mg total) by mouth 3 (three) times daily.  . hydrocortisone cream 1 % Apply 1 application topically 2 (two) times daily.  Marland Kitchen ipratropium-albuterol (DUONEB) 0.5-2.5 (3) MG/3ML SOLN Take 3 mLs by nebulization every 6 (six) hours as needed.  . isosorbide mononitrate (IMDUR) 30 MG 24 hr tablet Take 1 tablet (30 mg total) by mouth daily.  Marland Kitchen lisinopril (ZESTRIL) 40 MG tablet Take 1 tablet (40 mg total) by mouth daily.  Marland Kitchen loperamide (IMODIUM A-D) 2 MG tablet amt: 2mg ; oral Special Instructions:  after each stool do not exceed more than 4tabs in 24 hours, As Needed  . Melatonin 10 MG TABS Take 10 mg by mouth at bedtime.  . memantine (NAMENDA) 10 MG tablet Take 10 mg by mouth 2 (two) times daily.  . methocarbamol (ROBAXIN) 500 MG tablet Take 500 mg by mouth daily in the afternoon.  . metoprolol tartrate (LOPRESSOR) 25 MG tablet Take 1 tablet (25 mg total) by mouth 2 (two) times daily.  . mirabegron ER (MYRBETRIQ) 25 MG TB24 tablet Take 1 tablet (25 mg total)  by mouth daily.  . NON FORMULARY Diet: Regular  . omeprazole (PRILOSEC) 20 MG capsule Take 1 capsule (20 mg total) by mouth daily before breakfast.  . ondansetron (ZOFRAN-ODT) 8 MG disintegrating tablet Take 8 mg by mouth 3 (three) times daily before meals. As needed for vomiting and nausea  . OXYGEN Inhale 2 L into the lungs continuous.  . polyethylene glycol (MIRALAX / GLYCOLAX) 17 g packet Take 17 g by mouth daily as needed.  . potassium chloride SA (KLOR-CON) 20 MEQ tablet Take 20 mEq by mouth 2 (two) times daily.  Marland Kitchen rOPINIRole (REQUIP) 0.25 MG tablet Take 0.25 mg by mouth at bedtime.  . salicylic acid 17 % gel Apply 1 application topically in the morning and at bedtime. Apply to warts to left hand  . senna-docusate (SENOKOT-S) 8.6-50 MG tablet Take 1 tablet by mouth at bedtime as needed for mild constipation.  . simethicone (MYLICON) 431 MG chewable tablet Chew 125 mg by mouth in the morning, at noon, in the evening, and at bedtime.  Marland Kitchen venlafaxine XR (EFFEXOR-XR) 150 MG 24 hr capsule Take 150 mg by mouth daily with breakfast.   No facility-administered encounter medications on file as of 11/02/2020.     SIGNIFICANT DIAGNOSTIC EXAMS   PREVIOUS   06-09-20: ct of abdomen and pelvis:  Cholelithiasis. Haziness noted around the gallbladder. Cannot exclude acute cholecystitis. Continued wall thickening in the left colon from the distal transverse colon through the distal descending colon concerning for colitis. Left colonic  diverticulosis. Small bilateral pleural effusions, increasing since prior study. Bibasilar atelectasis. Coronary artery disease, aortic atherosclerosis.  06-09-20: abdominal ultrasound:  1. Moderate gallbladder sludge with probable punctate stones. Slight increased wall thickness with small amount of fluid adjacent to the gallbladder but negative sonographic Murphy. Findings are suggestive of but not definitive for cholecystitis; consider correlation with nuclear medicine hepatobiliary imaging. 6 mm gallbladder polyp. 2. Simple appearing renal cysts. 3. Incidental note made of pleural effusions.  06-12-20: HIDA:  1. No scintigraphic evidence of acute cholecystitis. 2. Findings compatible with severe biliary dyskinesia with little to no gallbladder emptying and elicitation of abdominal pain with the CCK administration.  06-13-20: chest x-ray:  Diffuse interstitial opacity with Awanda Mink lines with possible trace pleural fluid. Stable heart size and aortic contours. Negative for air leak  NO NEW EXAMS.      LABS REVIEWED PREVIOUS    03-09-20: wbc 13.7; hgb 12.1; hct 37.7; mcv 80.4 plt 205; glucose 208; bun 21; creat 1.50; k+ 3.2; na++ 141; ca 9.2; hgb a1c 5.9; HIV: nr 03-10-20: wbc 11.2; hgb 11.3; hct 35.9; mcv 82.7 plt 188; glucose 160; bun 18; creat 1.21; k+ 3.3; na++ 139; ca 8.8 liver normal 3.0; mag 2.3 03-12-20: wbc 10.4; hgb 9.3; hct 29.6; mcv 84.3 plt 178; glucose 138; bun 34; creat 1.77; k+ 3.8; na++ 138; ca 8.3 03-14-20: glucose 134; bun 36; creat 1.23; k+ 3.2; na++ 139; ca 8.8 mag 2.1   03-16-20: hgb 10.7; hct 33.4 glucose 127; bun 18; creat 0.86 k+ 3.3; na++ 139; ca 9.2 ;liver normal albumin 2.5 iron 23; tibc 221; ferritin 137 03-23-20: k+ 3.0  03-27-20: glucose 133; bun 34; creat 0.85; k+ 3.6; na++ 142; ca 9.0; AM cortisol: 10.6  05-22-20: wbc 19.4; hgb 14.6; hct 43.8; mcv 81.6 plt 254; glucose 211; bun 35; creat 1.20; k+ 3.0; na++ 138; ca 9.8; liver normal albumin 3.5 mag 1.9; urine  culture: <10,000; blood culture: no growth 05-25-20: wbc 14.2; hgb 20.8; hct 34.8; mcv  84.7 plt 212; glucose 109; bun 24; creat 0.86; k+ 3.4; na++ 137; ca 8.9; mag 1.9 05-28-20: wbc 10.9; hgb 11.8; hct 36.8; mcv 81.8 plt 227; glucose 132; bun 10; creat 0.82; k+ 3.0; na++ 137; ca 8.7 06-01-20: wbc 12.2; hgb 12.5; hct 40.3; mcv 81.9 plt 136; glucose 136; bun 12; creat 0.81; k+ 3.0; na++ 140; ca 8.9 mag 1.9 06-05-20: wbc 13.6; hgb 10.2; hct 32.9; mcv 93.5 plt 266 glucose 125; bun 35; creat 1.26; k+ 3.5; na++ 137; ca 8.9  06-07-20: wbc 11.9; hgb 9.6; hct 29.8; mcv 82.8 plt 318; glucose 114; bun 42; creat 1.47; k+ 3.9; na++ 138; ca 8.8 GFR 46; liver normal albumin 2.1  06-08-20: glucose 107; bun 57; creat 1.81; k+ 4.3; na++ 135; ca 8.9 GFR 36 06-09-20: glucose 107; bun 52; creat 1.64; k+ 5.3; na++ 136; ca 9.3;  GFR 40; liver normal albumin 2.3 06-10-20: wbc 12.6; hgb 10.5; hct 34.2; mcv 83.4 plt 417; glucose 126; bun 39; creat 4.4; na++ 137; ca 9.5 GFR 58; liver normal albumin 2.2 06-12-20: wbc 8.2; hgb 10.4; hct 33.6; mcv 81.2 plt 364; glucose 123; bun 24; creat 0.87; k+ 3.5; na++ 138; GFR >60; liver normal albumin 1.9 06-13-20: urine culture: <10,000 colonies 06-14-20: wbc 9.0; hgb 10.5; hct 33.8; mcv 80.5 plt 267; glucose 122; bun 20; creat 0.97; k+3.3; na++ 140; ca 9.0 GFR >60 06-29-20: wbc 10.1; hgb 10.7; hct 36.1; mcv 83.0 plt 294; glucose 152; bun 26; creat 1.02; k+ 4.4; na++ 138 ca 8.8 GFR>60 07-24-20 PSA 16.0 08-14-20: glucose 110; bun 33; creat 1.00 ;k+ 2.5 na++ 140 08-15-20: glucose 114; bun 31; creat  0.84; k+ 3.1; na++ 142 08-21-20: k+ 3.5 10-04-20: albumin 3.2  10-19-20: PSA 12.25 (13.05)  NO NEW LABS.    Review of Systems  Constitutional: Negative for malaise/fatigue.  Respiratory: Negative for cough and shortness of breath.   Cardiovascular: Negative for chest pain, palpitations and leg swelling.  Gastrointestinal: Negative for abdominal pain, constipation and heartburn.  Musculoskeletal:  Negative for back pain, joint pain and myalgias.  Skin: Negative.   Neurological: Negative for dizziness.  Psychiatric/Behavioral: The patient is not nervous/anxious.    Physical Exam Constitutional:      General: He is not in acute distress.    Appearance: He is well-developed. He is not diaphoretic.  Neck:     Thyroid: No thyromegaly.  Cardiovascular:     Rate and Rhythm: Normal rate and regular rhythm.     Pulses: Normal pulses.     Heart sounds: Normal heart sounds.  Pulmonary:     Effort: Pulmonary effort is normal. No respiratory distress.     Breath sounds: Normal breath sounds.  Abdominal:     General: Bowel sounds are normal. There is no distension.     Palpations: Abdomen is soft.     Tenderness: There is no abdominal tenderness.  Musculoskeletal:        General: Normal range of motion.     Cervical back: Neck supple.     Left lower leg: No edema.     Comments: Status post right hip internal fixation 03-10-20   Lymphadenopathy:     Cervical: No cervical adenopathy.  Skin:    General: Skin is warm and dry.  Neurological:     Mental Status: He is alert and oriented to person, place, and time.  Psychiatric:        Mood and Affect: Mood normal.       ASSESSMENT/ PLAN:  TODAY  1. Aortic atherosclerosis 2. Dementia without behavioral disturbance unspecified dementia type 3. Interstitial lung disease 4. Stage 3b chronic kidney disease  Will continue current medications Will continue current plan of care Will continue to monitor his status.    Time spent with patient: 40 minutes: medications; health status; dietary needs.     Ok Edwards NP [facility]  Contact [phone] Monday through Friday 8am- 5pm  After hours call [phone]

## 2020-11-15 DIAGNOSIS — M1712 Unilateral primary osteoarthritis, left knee: Secondary | ICD-10-CM | POA: Diagnosis not present

## 2020-11-15 DIAGNOSIS — M19072 Primary osteoarthritis, left ankle and foot: Secondary | ICD-10-CM | POA: Diagnosis not present

## 2020-11-15 DIAGNOSIS — M79662 Pain in left lower leg: Secondary | ICD-10-CM | POA: Diagnosis not present

## 2020-11-16 DIAGNOSIS — F4323 Adjustment disorder with mixed anxiety and depressed mood: Secondary | ICD-10-CM | POA: Diagnosis not present

## 2020-11-23 ENCOUNTER — Telehealth: Payer: Self-pay

## 2020-11-23 DIAGNOSIS — F4323 Adjustment disorder with mixed anxiety and depressed mood: Secondary | ICD-10-CM | POA: Diagnosis not present

## 2020-11-24 MED ORDER — MIRABEGRON ER 50 MG PO TB24: 50.0000 mg | ORAL_TABLET | Freq: Every day | ORAL | 11 refills | Status: DC

## 2020-11-24 NOTE — Telephone Encounter (Signed)
Daughter in law made aware medication was sent in she states patient is at [facility]. [place] called and message left at front desk to have nurse back office concerning patient.

## 2020-11-27 DIAGNOSIS — L602 Onychogryphosis: Secondary | ICD-10-CM | POA: Diagnosis not present

## 2020-11-27 DIAGNOSIS — I739 Peripheral vascular disease, unspecified: Secondary | ICD-10-CM | POA: Diagnosis not present

## 2020-11-28 DIAGNOSIS — F411 Generalized anxiety disorder: Secondary | ICD-10-CM | POA: Diagnosis not present

## 2020-11-28 DIAGNOSIS — G3184 Mild cognitive impairment, so stated: Secondary | ICD-10-CM | POA: Diagnosis not present

## 2020-11-28 DIAGNOSIS — F321 Major depressive disorder, single episode, moderate: Secondary | ICD-10-CM | POA: Diagnosis not present

## 2020-11-30 DIAGNOSIS — F4323 Adjustment disorder with mixed anxiety and depressed mood: Secondary | ICD-10-CM | POA: Diagnosis not present

## 2020-12-06 DIAGNOSIS — Z1159 Encounter for screening for other viral diseases: Secondary | ICD-10-CM | POA: Diagnosis not present

## 2020-12-06 DIAGNOSIS — K529 Noninfective gastroenteritis and colitis, unspecified: Secondary | ICD-10-CM | POA: Diagnosis not present

## 2020-12-12 ENCOUNTER — Non-Acute Institutional Stay (SKILLED_NURSING_FACILITY): Payer: Medicare Other | Admitting: Adult Health

## 2020-12-12 ENCOUNTER — Encounter: Payer: Self-pay | Admitting: Adult Health

## 2020-12-12 DIAGNOSIS — E785 Hyperlipidemia, unspecified: Secondary | ICD-10-CM

## 2020-12-12 DIAGNOSIS — I700 Atherosclerosis of aorta: Secondary | ICD-10-CM

## 2020-12-12 DIAGNOSIS — G2581 Restless legs syndrome: Secondary | ICD-10-CM

## 2020-12-12 DIAGNOSIS — K5909 Other constipation: Secondary | ICD-10-CM

## 2020-12-12 DIAGNOSIS — K529 Noninfective gastroenteritis and colitis, unspecified: Secondary | ICD-10-CM | POA: Diagnosis not present

## 2020-12-12 DIAGNOSIS — Z1159 Encounter for screening for other viral diseases: Secondary | ICD-10-CM | POA: Diagnosis not present

## 2020-12-12 NOTE — Progress Notes (Signed)
Location:  [facility] Room Number: 156 Place of Service:  SNF (31)   CODE STATUS: dnr  Allergies  Allergen Reactions  . Hctz [Hydrochlorothiazide]     hypokalemia    Chief Complaint  Patient presents with  . Medical Management of Chronic Issues        Dyslipidemia:      Chronic constipation:     Major depression recurrent chronic:  RLS    HPI:  He is a 87 year old long term resident of this facility being seen for the management of his chronic illnesses: Dyslipidemia:      Chronic constipation:     Major depression recurrent chronic:  RLS. There are no reports of constipation. No reports of uncontrolled pain; no reports of depressive thoughts.   Past Medical History:  Diagnosis Date  . Anxiety   . Cancer [facility])    prostate  . Chronic chest wall pain   . Essential hypertension, benign 06/20/2016  . GAD (generalized anxiety disorder)   . GERD (gastroesophageal reflux disease)   . H/O echocardiogram 2016   normal  . Heart murmur    "leaking heart valve"  . High cholesterol 06/20/2016  . HOH (hard of hearing)    left  . Hx of falling   . Hyperlipidemia   . Interstitial pulmonary disease ([place])   . MDD (major depressive disorder)   . Murmur, cardiac   . Poor historian   . PUD (peptic ulcer disease)   . RLS (restless legs syndrome)   . Shortness of breath   . Wrist fracture 12/16/2011    Past Surgical History:  Procedure Laterality Date  . CHOLECYSTECTOMY N/A 06/28/2020   Procedure: LAPAROSCOPIC CHOLECYSTECTOMY;  Surgeon: [name], MD;  Location: [facility];  Service: General;  Laterality: N/A;  pt in [facility]  . COLONOSCOPY  2005   Rehman: Sigmoid colon diverticulosis, moderate sized external hemorrhoids. A few focal areas of mucosal erythema felt to be nonspecific.  Marland Kitchen ESOPHAGOGASTRODUODENOSCOPY    . ESOPHAGOGASTRODUODENOSCOPY N/A 07/18/2016   Rehman: normal exam except duodenal scar. h.pylori serologies negative  . FLEXIBLE  SIGMOIDOSCOPY N/A 06/11/2020   Procedure: FLEXIBLE SIGMOIDOSCOPY;  Surgeon: Eloise Harman, DO;  Location: [facility];  Service: Endoscopy;  Laterality: N/A;  . HIP PINNING,CANNULATED Right 03/10/2020   Procedure: INTERNAL FIXATION RIGHT HIP;  Surgeon: Carole Civil, MD;  Location: [facility] ORS;  Service: Orthopedics;  Laterality: Right;  . ORIF WRIST FRACTURE  12/19/2011   Procedure: OPEN REDUCTION INTERNAL FIXATION (ORIF) WRIST FRACTURE;  Surgeon: Carole Civil, MD;  Location: [facility];  Service: Orthopedics;  Laterality: Right;  . prostate cancer     Diagnosed in the 90s  . TONSILLECTOMY      Social History   Socioeconomic History  . Marital status: Widowed    Spouse name: Not on file  . Number of children: Not on file  . Years of education: Not on file  . Highest education level: Not on file  Occupational History  . Occupation: retired    Fish farm manager: RETIRED  Tobacco Use  . Smoking status: Never  . Smokeless tobacco: Never  Vaping Use  . Vaping Use: Never used  Substance and Sexual Activity  . Alcohol use: No  . Drug use: No  . Sexual activity: Not Currently  Other Topics Concern  . Not on file  Social History Narrative   Is long term [facility] of [facility]    Social Determinants of  Health   Financial Resource Strain: Not on file  Food Insecurity: Not on file  Transportation Needs: Not on file  Physical Activity: Not on file  Stress: Not on file  Social Connections: Not on file  Intimate Partner Violence: Not on file   Family History  Problem Relation Age of Onset  . Cancer Other   . Heart attack Mother   . Cancer Brother   . Heart attack Brother   . Heart disease Sister        by pass      VITAL SIGNS BP (!) 147/60   Pulse 66   Temp 98.7 F (37.1 C)   Resp 20   Ht 5\' 9"  (1.753 m)   Wt 178 lb 3.2 oz (80.8 kg)   BMI 26.32 kg/m   Outpatient Encounter Medications as of 12/12/2020  Medication Sig  . acetaminophen (TYLENOL) 500 MG tablet Take 500 mg by  mouth every 6 (six) hours.  Marland Kitchen alfuzosin (UROXATRAL) 10 MG 24 hr tablet Take 10 mg by mouth daily with breakfast.  . Amino Acids-Protein Hydrolys (FEEDING SUPPLEMENT, PRO-STAT SUGAR FREE 64,) LIQD Take 30 mLs by mouth daily at 2 PM.  . Ascorbic Acid (VITAMIN C) 1000 MG tablet Take 1,000 mg by mouth daily.  Marland Kitchen aspirin 81 MG chewable tablet Chew 1 tablet (81 mg total) by mouth daily with breakfast.  . Balsam [place]-Castor Oil (VENELEX) OINT Special Instructions: Apply to bilateral buttocks & sacral area qshift for blanchable erythema.  . calcium carbonate (TUMS - DOSED IN MG ELEMENTAL CALCIUM) 500 MG chewable tablet Chew 1 tablet by mouth 2 (two) times daily as needed for indigestion or heartburn.   . diclofenac Sodium (VOLTAREN) 1 % GEL Apply 4 g topically 2 (two) times daily. Special Instructions: apply 4 gm to left hip twice daily for pain management.  Marland Kitchen diltiazem (CARDIZEM CD) 360 MG 24 hr capsule Take 1 capsule (360 mg total) by mouth daily.  . ferrous sulfate 325 (65 FE) MG tablet Take 325 mg by mouth daily with breakfast. For Anemia and  Iron Deficiency  . Flax Oil-Fish Oil-Borage Oil (FISH OIL-FLAX OIL-BORAGE OIL) CAPS Take 1 capsule by mouth daily.  . hydrALAZINE (APRESOLINE) 50 MG tablet Take 1 tablet (50 mg total) by mouth 3 (three) times daily.  . hydrocortisone cream 1 % Apply 1 application topically 2 (two) times daily.  Marland Kitchen ipratropium-albuterol (DUONEB) 0.5-2.5 (3) MG/3ML SOLN Take 3 mLs by nebulization every 6 (six) hours as needed.  . isosorbide mononitrate (IMDUR) 30 MG 24 hr tablet Take 1 tablet (30 mg total) by mouth daily.  Marland Kitchen lisinopril (ZESTRIL) 40 MG tablet Take 1 tablet (40 mg total) by mouth daily.  Marland Kitchen loperamide (IMODIUM A-D) 2 MG tablet amt: 2mg ; oral Special Instructions: after each stool do not exceed more than 4tabs in 24 hours, As Needed  . Melatonin 10 MG TABS Take 10 mg by mouth at bedtime.  . memantine (NAMENDA) 10 MG tablet Take 10 mg by mouth 2 (two) times daily.  .  methocarbamol (ROBAXIN) 500 MG tablet Take 500 mg by mouth daily in the afternoon.  . metoprolol tartrate (LOPRESSOR) 25 MG tablet Take 1 tablet (25 mg total) by mouth 2 (two) times daily.  . mirabegron ER (MYRBETRIQ) 50 MG TB24 tablet Take 1 tablet (50 mg total) by mouth daily.  . NON FORMULARY Diet: Regular  . omeprazole (PRILOSEC) 20 MG capsule Take 1 capsule (20 mg total) by mouth daily before breakfast.  . ondansetron (  ZOFRAN-ODT) 8 MG disintegrating tablet Take 8 mg by mouth 3 (three) times daily before meals. As needed for vomiting and nausea  . OXYGEN Inhale 2 L into the lungs continuous.  . polyethylene glycol (MIRALAX / GLYCOLAX) 17 g packet Take 17 g by mouth daily as needed.  . potassium chloride SA (KLOR-CON) 20 MEQ tablet Take 20 mEq by mouth 2 (two) times daily.  Marland Kitchen rOPINIRole (REQUIP) 0.25 MG tablet Take 0.25 mg by mouth at bedtime.  . salicylic acid 17 % gel Apply 1 application topically in the morning and at bedtime. Apply to warts to left hand  . senna-docusate (SENOKOT-S) 8.6-50 MG tablet Take 1 tablet by mouth at bedtime as needed for mild constipation.  . simethicone (MYLICON) 735 MG chewable tablet Chew 125 mg by mouth in the morning, at noon, in the evening, and at bedtime.  Marland Kitchen venlafaxine XR (EFFEXOR-XR) 150 MG 24 hr capsule Take 150 mg by mouth daily with breakfast.   No facility-administered encounter medications on file as of 12/12/2020.     SIGNIFICANT DIAGNOSTIC EXAMS   PREVIOUS   06-09-20: ct of abdomen and pelvis:  Cholelithiasis. Haziness noted around the gallbladder. Cannot exclude acute cholecystitis. Continued wall thickening in the left colon from the distal transverse colon through the distal descending colon concerning for colitis. Left colonic diverticulosis. Small bilateral pleural effusions, increasing since prior study. Bibasilar atelectasis. Coronary artery disease, aortic atherosclerosis.  06-09-20: abdominal ultrasound:  1. Moderate  gallbladder sludge with probable punctate stones. Slight increased wall thickness with small amount of fluid adjacent to the gallbladder but negative sonographic Murphy. Findings are suggestive of but not definitive for cholecystitis; consider correlation with nuclear medicine hepatobiliary imaging. 6 mm gallbladder polyp. 2. Simple appearing renal cysts. 3. Incidental note made of pleural effusions.  06-12-20: HIDA:  1. No scintigraphic evidence of acute cholecystitis. 2. Findings compatible with severe biliary dyskinesia with little to no gallbladder emptying and elicitation of abdominal pain with the CCK administration.  06-13-20: chest x-ray:  Diffuse interstitial opacity with Awanda Mink lines with possible trace pleural fluid. Stable heart size and aortic contours. Negative for air leak  NO NEW EXAMS.      LABS REVIEWED PREVIOUS    03-09-20: wbc 13.7; hgb 12.1; hct 37.7; mcv 80.4 plt 205; glucose 208; bun 21; creat 1.50; k+ 3.2; na++ 141; ca 9.2; hgb a1c 5.9; HIV: nr 03-10-20: wbc 11.2; hgb 11.3; hct 35.9; mcv 82.7 plt 188; glucose 160; bun 18; creat 1.21; k+ 3.3; na++ 139; ca 8.8 liver normal 3.0; mag 2.3 03-12-20: wbc 10.4; hgb 9.3; hct 29.6; mcv 84.3 plt 178; glucose 138; bun 34; creat 1.77; k+ 3.8; na++ 138; ca 8.3 03-14-20: glucose 134; bun 36; creat 1.23; k+ 3.2; na++ 139; ca 8.8 mag 2.1   03-16-20: hgb 10.7; hct 33.4 glucose 127; bun 18; creat 0.86 k+ 3.3; na++ 139; ca 9.2 ;liver normal albumin 2.5 iron 23; tibc 221; ferritin 137 03-23-20: k+ 3.0  03-27-20: glucose 133; bun 34; creat 0.85; k+ 3.6; na++ 142; ca 9.0; AM cortisol: 10.6  05-22-20: wbc 19.4; hgb 14.6; hct 43.8; mcv 81.6 plt 254; glucose 211; bun 35; creat 1.20; k+ 3.0; na++ 138; ca 9.8; liver normal albumin 3.5 mag 1.9; urine culture: <10,000; blood culture: no growth 05-25-20: wbc 14.2; hgb 20.8; hct 34.8; mcv 84.7 plt 212; glucose 109; bun 24; creat 0.86; k+ 3.4; na++ 137; ca 8.9; mag 1.9 05-28-20: wbc 10.9; hgb 11.8; hct 36.8;  mcv 81.8 plt 227; glucose 132;  bun 10; creat 0.82; k+ 3.0; na++ 137; ca 8.7 06-01-20: wbc 12.2; hgb 12.5; hct 40.3; mcv 81.9 plt 136; glucose 136; bun 12; creat 0.81; k+ 3.0; na++ 140; ca 8.9 mag 1.9 06-05-20: wbc 13.6; hgb 10.2; hct 32.9; mcv 93.5 plt 266 glucose 125; bun 35; creat 1.26; k+ 3.5; na++ 137; ca 8.9  06-07-20: wbc 11.9; hgb 9.6; hct 29.8; mcv 82.8 plt 318; glucose 114; bun 42; creat 1.47; k+ 3.9; na++ 138; ca 8.8 GFR 46; liver normal albumin 2.1  06-08-20: glucose 107; bun 57; creat 1.81; k+ 4.3; na++ 135; ca 8.9 GFR 36 06-09-20: glucose 107; bun 52; creat 1.64; k+ 5.3; na++ 136; ca 9.3;  GFR 40; liver normal albumin 2.3 06-10-20: wbc 12.6; hgb 10.5; hct 34.2; mcv 83.4 plt 417; glucose 126; bun 39; creat 4.4; na++ 137; ca 9.5 GFR 58; liver normal albumin 2.2 06-12-20: wbc 8.2; hgb 10.4; hct 33.6; mcv 81.2 plt 364; glucose 123; bun 24; creat 0.87; k+ 3.5; na++ 138; GFR >60; liver normal albumin 1.9 06-13-20: urine culture: <10,000 colonies 06-14-20: wbc 9.0; hgb 10.5; hct 33.8; mcv 80.5 plt 267; glucose 122; bun 20; creat 0.97; k+3.3; na++ 140; ca 9.0 GFR >60 06-29-20: wbc 10.1; hgb 10.7; hct 36.1; mcv 83.0 plt 294; glucose 152; bun 26; creat 1.02; k+ 4.4; na++ 138 ca 8.8 GFR>60 07-24-20 PSA 16.0 08-14-20: glucose 110; bun 33; creat 1.00 ;k+ 2.5 na++ 140 08-15-20: glucose 114; bun 31; creat  0.84; k+ 3.1; na++ 142 08-21-20: k+ 3.5 10-04-20: albumin 3.2  10-19-20: PSA 12.25 (13.05)   NO NEW LABS.   Review of Systems  Constitutional:  Negative for malaise/fatigue.  Respiratory:  Negative for cough and shortness of breath.   Cardiovascular:  Negative for chest pain, palpitations and leg swelling.  Gastrointestinal:  Negative for abdominal pain, constipation and heartburn.  Musculoskeletal:  Negative for back pain, joint pain and myalgias.  Skin: Negative.   Neurological:  Negative for dizziness.  Psychiatric/Behavioral:  The patient is not nervous/anxious.    Physical Exam Constitutional:       General: He is not in acute distress.    Appearance: He is well-developed. He is not diaphoretic.  Neck:     Thyroid: No thyromegaly.  Cardiovascular:     Rate and Rhythm: Normal rate and regular rhythm.     Pulses: Normal pulses.     Heart sounds: Normal heart sounds.  Pulmonary:     Effort: Pulmonary effort is normal. No respiratory distress.     Breath sounds: Normal breath sounds.  Abdominal:     General: Bowel sounds are normal. There is no distension.     Palpations: Abdomen is soft.     Tenderness: There is no abdominal tenderness.  Musculoskeletal:        General: Normal range of motion.     Cervical back: Neck supple.     Right lower leg: No edema.     Left lower leg: No edema.     Comments: Status post right hip internal fixation 03-10-20    Lymphadenopathy:     Cervical: No cervical adenopathy.  Skin:    General: Skin is warm and dry.  Neurological:     Mental Status: He is alert and oriented to person, place, and time.  Psychiatric:        Mood and Affect: Mood normal.     ASSESSMENT/ PLAN:  TODAY  Dyslipidemia: is stable will continue fish oil daily is off zocor  2. Chronic  constipation: is stable will continue miralax daily and senna s as needed  3. Major depression recurrent chronic: is stable will continue effexor xr 225 mg daily and melatonin 10 mg nightly for sleep  4.RLS: is stable will continue requip 0.25 mg nightly   PREVIOUS   5. Iron deficiency due to dietary intake: is stable hgb 10.5 will continue iron daily  6. Interstitial lung disease: is stable has prn 02; and duoneb every 6 hours as needed.   7. Left hip pain: is stable will continue voltaren gel 4 gm twice daily   8. Dementia without behavioral disturbance unspecified dementia type: is stable weight is 178 pounds: will continue namenda 10 mg twice daily   19 Aortic atherosclerosis (ct 06-09-20) will continue asa 81 mg daily  10. Protein calorie malnutrition severe: has improved  albumin 3.2 will continue prostat  30 ML daily  11. Ischemic colitis/biliary dyskinesia/ calculus of gall bladder without cholecystitis without obstruction: is stable is status post cholecystectomy: 06-28-20. Will continue gas X 125 mg four times daily is on regular diet.  12. Essential hypertension: is stable b/p 147/60 will continue lopressor 25 mg twice daily cardizem cd 360 mg daily lisinopril 40 mg daily   13. Gastroesophageal reflux disease without esophagitis: is stable will continue prilosec 20 mg daily   14. Prostate cancer/overactive bladder: is stable will continue urosatral 10 mg daily no oxybutrin due to history of fracture; is followed by urology; will continue mybertriq 25 mg daily    15. Hypokalemia: is stable k+ 3.5 will continue k+ 20 meq twice daily     Will check cbc; cmp lipids         Ok Edwards NP [facility]  Contact [phone] Monday through Friday 8am- 5pm  After hours call [phone]

## 2020-12-14 DIAGNOSIS — Z1159 Encounter for screening for other viral diseases: Secondary | ICD-10-CM | POA: Diagnosis not present

## 2020-12-14 DIAGNOSIS — K529 Noninfective gastroenteritis and colitis, unspecified: Secondary | ICD-10-CM | POA: Diagnosis not present

## 2020-12-19 DIAGNOSIS — K529 Noninfective gastroenteritis and colitis, unspecified: Secondary | ICD-10-CM | POA: Diagnosis not present

## 2020-12-19 DIAGNOSIS — Z1159 Encounter for screening for other viral diseases: Secondary | ICD-10-CM | POA: Diagnosis not present

## 2020-12-21 ENCOUNTER — Other Ambulatory Visit (HOSPITAL_COMMUNITY)
Admission: RE | Admit: 2020-12-21 | Discharge: 2020-12-21 | Disposition: A | Discharge status: Home / Self Care | Payer: Medicare Other | Source: Skilled Nursing Facility | Attending: Adult Health | Admitting: Adult Health

## 2020-12-21 ENCOUNTER — Non-Acute Institutional Stay (SKILLED_NURSING_FACILITY): Payer: Medicare Other | Admitting: Adult Health

## 2020-12-21 DIAGNOSIS — Z1159 Encounter for screening for other viral diseases: Secondary | ICD-10-CM | POA: Diagnosis not present

## 2020-12-21 DIAGNOSIS — E43 Unspecified severe protein-calorie malnutrition: Secondary | ICD-10-CM

## 2020-12-21 DIAGNOSIS — E876 Hypokalemia: Secondary | ICD-10-CM | POA: Diagnosis not present

## 2020-12-21 DIAGNOSIS — I10 Essential (primary) hypertension: Secondary | ICD-10-CM | POA: Insufficient documentation

## 2020-12-21 DIAGNOSIS — K529 Noninfective gastroenteritis and colitis, unspecified: Secondary | ICD-10-CM | POA: Diagnosis not present

## 2020-12-21 DIAGNOSIS — F4323 Adjustment disorder with mixed anxiety and depressed mood: Secondary | ICD-10-CM | POA: Diagnosis not present

## 2020-12-21 LAB — COMPREHENSIVE METABOLIC PANEL
ALT: 17 U/L (ref 0–44)
AST: 16 U/L (ref 15–41)
Albumin: 3.3 g/dL — ABNORMAL LOW (ref 3.5–5.0)
Alkaline Phosphatase: 86 U/L (ref 38–126)
Anion gap: 6 (ref 5–15)
BUN: 23 mg/dL (ref 8–23)
CO2: 32 mmol/L (ref 22–32)
Calcium: 9.3 mg/dL (ref 8.9–10.3)
Chloride: 105 mmol/L (ref 98–111)
Creatinine, Ser: 0.89 mg/dL (ref 0.61–1.24)
GFR, Estimated: >60 mL/min (ref >60)
Glucose, Bld: 104 mg/dL — ABNORMAL HIGH (ref 70–99)
Potassium: 3 mmol/L — ABNORMAL LOW (ref 3.5–5.1)
Sodium: 143 mmol/L (ref 135–145)
Total Bilirubin: 0.4 mg/dL (ref 0.3–1.2)
Total Protein: 5.7 g/dL — ABNORMAL LOW (ref 6.5–8.1)

## 2020-12-21 LAB — LIPID PANEL
Cholesterol: 128 mg/dL (ref 0–200)
HDL: 40 mg/dL — ABNORMAL LOW (ref >40)
LDL Cholesterol: 71 mg/dL (ref 0–99)
Total CHOL/HDL Ratio: 3.2 RATIO
Triglycerides: 85 mg/dL (ref <150)
VLDL: 17 mg/dL (ref 0–40)

## 2020-12-21 LAB — CBC
HCT: 40.9 % (ref 39.0–52.0)
Hemoglobin: 13.4 g/dL (ref 13.0–17.0)
MCH: 28.2 pg (ref 26.0–34.0)
MCHC: 32.8 g/dL (ref 30.0–36.0)
MCV: 86.1 fL (ref 80.0–100.0)
Platelets: 223 10*3/uL (ref 150–400)
RBC: 4.75 MIL/uL (ref 4.22–5.81)
RDW: 14.4 % (ref 11.5–15.5)
WBC: 6.6 10*3/uL (ref 4.0–10.5)
nRBC: 0 % (ref 0.0–0.2)

## 2020-12-21 NOTE — Progress Notes (Signed)
Location:  [place] Room Number: 156 Place of Service:  SNF (31)   CODE STATUS: dnr   Allergies  Allergen Reactions  . Hctz [Hydrochlorothiazide]     hypokalemia    Chief Complaint  Patient presents with  . Acute Visit    Lab follow up    HPI:  Hisk+ is low at 3.0. his albumin is stable at 3.3.   he is on prostat. At this time. He will be able to come off prostat. There are no reports of uncontrolled pain; no spasticity present. He is on supplemental k+.   Past Medical History:  Diagnosis Date  . Anxiety   . Cancer [facility])    prostate  . Chronic chest wall pain   . Essential hypertension, benign 06/20/2016  . GAD (generalized anxiety disorder)   . GERD (gastroesophageal reflux disease)   . H/O echocardiogram 2016   normal  . Heart murmur    "leaking heart valve"  . High cholesterol 06/20/2016  . HOH (hard of hearing)    left  . Hx of falling   . Hyperlipidemia   . Interstitial pulmonary disease ([place])   . MDD (major depressive disorder)   . Murmur, cardiac   . Poor historian   . PUD (peptic ulcer disease)   . RLS (restless legs syndrome)   . Shortness of breath   . Wrist fracture 12/16/2011    Past Surgical History:  Procedure Laterality Date  . CHOLECYSTECTOMY N/A 06/28/2020   Procedure: LAPAROSCOPIC CHOLECYSTECTOMY;  Surgeon: [name], MD;  Location: [facility] ORS;  Service: General;  Laterality: N/A;  pt in [facility]  . COLONOSCOPY  2005   Rehman: Sigmoid colon diverticulosis, moderate sized external hemorrhoids. A few focal areas of mucosal erythema felt to be nonspecific.  Marland Kitchen ESOPHAGOGASTRODUODENOSCOPY    . ESOPHAGOGASTRODUODENOSCOPY N/A 07/18/2016   Rehman: normal exam except duodenal scar. h.pylori serologies negative  . FLEXIBLE SIGMOIDOSCOPY N/A 06/11/2020   Procedure: FLEXIBLE SIGMOIDOSCOPY;  Surgeon: Eloise Harman, DO;  Location: [facility] ENDO SUITE;  Service: Endoscopy;  Laterality: N/A;  . HIP PINNING,CANNULATED Right  03/10/2020   Procedure: INTERNAL FIXATION RIGHT HIP;  Surgeon: Carole Civil, MD;  Location: [facility] ORS;  Service: Orthopedics;  Laterality: Right;  . ORIF WRIST FRACTURE  12/19/2011   Procedure: OPEN REDUCTION INTERNAL FIXATION (ORIF) WRIST FRACTURE;  Surgeon: Carole Civil, MD;  Location: [facility] ORS;  Service: Orthopedics;  Laterality: Right;  . prostate cancer     Diagnosed in the 90s  . TONSILLECTOMY      Social History   Socioeconomic History  . Marital status: Widowed    Spouse name: Not on file  . Number of children: Not on file  . Years of education: Not on file  . Highest education level: Not on file  Occupational History  . Occupation: retired    Fish farm manager: RETIRED  Tobacco Use  . Smoking status: Never  . Smokeless tobacco: Never  Vaping Use  . Vaping Use: Never used  Substance and Sexual Activity  . Alcohol use: No  . Drug use: No  . Sexual activity: Not Currently  Other Topics Concern  . Not on file  Social History Narrative   Is long term resident of [facility]    Social Determinants of Health   Financial Resource Strain: Not on file  Food Insecurity: Not on file  Transportation Needs: Not on file  Physical Activity: Not on file  Stress: Not on file  Social Connections: Not on file  Intimate Partner Violence: Not on file   Family History  Problem Relation Age of Onset  . Cancer Other   . Heart attack Mother   . Cancer Brother   . Heart attack Brother   . Heart disease Sister        by pass      VITAL SIGNS BP 129/68   Pulse 72   Temp 98.6 F (37 C)   Resp 18   Ht 5\' 9"  (1.753 m)   Wt 178 lb 3.2 oz (80.8 kg)   BMI 26.32 kg/m   Outpatient Encounter Medications as of 12/21/2020  Medication Sig  . acetaminophen (TYLENOL) 500 MG tablet Take 500 mg by mouth every 6 (six) hours.  Marland Kitchen alfuzosin (UROXATRAL) 10 MG 24 hr tablet Take 10 mg by mouth daily with breakfast.  . Amino Acids-Protein Hydrolys (FEEDING SUPPLEMENT, PRO-STAT SUGAR FREE 64,) LIQD  Take 30 mLs by mouth daily at 2 PM.  . Ascorbic Acid (VITAMIN C) 1000 MG tablet Take 1,000 mg by mouth daily.  Marland Kitchen aspirin 81 MG chewable tablet Chew 1 tablet (81 mg total) by mouth daily with breakfast.  . Balsam [place]-Castor Oil (VENELEX) OINT Special Instructions: Apply to bilateral buttocks & sacral area qshift for blanchable erythema.  . calcium carbonate (TUMS - DOSED IN MG ELEMENTAL CALCIUM) 500 MG chewable tablet Chew 1 tablet by mouth 2 (two) times daily as needed for indigestion or heartburn.   . diclofenac Sodium (VOLTAREN) 1 % GEL Apply 4 g topically 2 (two) times daily. Special Instructions: apply 4 gm to left hip twice daily for pain management.  Marland Kitchen diltiazem (CARDIZEM CD) 360 MG 24 hr capsule Take 1 capsule (360 mg total) by mouth daily.  . ferrous sulfate 325 (65 FE) MG tablet Take 325 mg by mouth daily with breakfast. For Anemia and  Iron Deficiency  . Flax Oil-Fish Oil-Borage Oil (FISH OIL-FLAX OIL-BORAGE OIL) CAPS Take 1 capsule by mouth daily.  . hydrALAZINE (APRESOLINE) 50 MG tablet Take 1 tablet (50 mg total) by mouth 3 (three) times daily.  . hydrocortisone cream 1 % Apply 1 application topically 2 (two) times daily.  Marland Kitchen ipratropium-albuterol (DUONEB) 0.5-2.5 (3) MG/3ML SOLN Take 3 mLs by nebulization every 6 (six) hours as needed.  . isosorbide mononitrate (IMDUR) 30 MG 24 hr tablet Take 1 tablet (30 mg total) by mouth daily.  Marland Kitchen lisinopril (ZESTRIL) 40 MG tablet Take 1 tablet (40 mg total) by mouth daily.  Marland Kitchen loperamide (IMODIUM A-D) 2 MG tablet amt: 2mg ; oral Special Instructions: after each stool do not exceed more than 4tabs in 24 hours, As Needed  . Melatonin 10 MG TABS Take 10 mg by mouth at bedtime.  . memantine (NAMENDA) 10 MG tablet Take 10 mg by mouth 2 (two) times daily.  . methocarbamol (ROBAXIN) 500 MG tablet Take 500 mg by mouth daily in the afternoon.  . metoprolol tartrate (LOPRESSOR) 25 MG tablet Take 1 tablet (25 mg total) by mouth 2 (two) times daily.  .  mirabegron ER (MYRBETRIQ) 50 MG TB24 tablet Take 1 tablet (50 mg total) by mouth daily.  . NON FORMULARY Diet: Regular  . omeprazole (PRILOSEC) 20 MG capsule Take 1 capsule (20 mg total) by mouth daily before breakfast.  . ondansetron (ZOFRAN-ODT) 8 MG disintegrating tablet Take 8 mg by mouth 3 (three) times daily before meals. As needed for vomiting and nausea  . OXYGEN Inhale 2 L into the lungs continuous.  Marland Kitchen  polyethylene glycol (MIRALAX / GLYCOLAX) 17 g packet Take 17 g by mouth daily as needed.  . potassium chloride SA (KLOR-CON) 20 MEQ tablet Take 20 mEq by mouth 2 (two) times daily.  Marland Kitchen rOPINIRole (REQUIP) 0.25 MG tablet Take 0.25 mg by mouth at bedtime.  . salicylic acid 17 % gel Apply 1 application topically in the morning and at bedtime. Apply to warts to left hand  . senna-docusate (SENOKOT-S) 8.6-50 MG tablet Take 1 tablet by mouth at bedtime as needed for mild constipation.  . simethicone (MYLICON) 431 MG chewable tablet Chew 125 mg by mouth in the morning, at noon, in the evening, and at bedtime.  Marland Kitchen venlafaxine XR (EFFEXOR-XR) 150 MG 24 hr capsule Take 150 mg by mouth daily with breakfast.   No facility-administered encounter medications on file as of 12/21/2020.     SIGNIFICANT DIAGNOSTIC EXAMS  PREVIOUS   06-09-20: ct of abdomen and pelvis:  Cholelithiasis. Haziness noted around the gallbladder. Cannot exclude acute cholecystitis. Continued wall thickening in the left colon from the distal transverse colon through the distal descending colon concerning for colitis. Left colonic diverticulosis. Small bilateral pleural effusions, increasing since prior study. Bibasilar atelectasis. Coronary artery disease, aortic atherosclerosis.  06-09-20: abdominal ultrasound:  1. Moderate gallbladder sludge with probable punctate stones. Slight increased wall thickness with small amount of fluid adjacent to the gallbladder but negative sonographic Murphy. Findings are suggestive of but not  definitive for cholecystitis; consider correlation with nuclear medicine hepatobiliary imaging. 6 mm gallbladder polyp. 2. Simple appearing renal cysts. 3. Incidental note made of pleural effusions.  06-12-20: HIDA:  1. No scintigraphic evidence of acute cholecystitis. 2. Findings compatible with severe biliary dyskinesia with little to no gallbladder emptying and elicitation of abdominal pain with the CCK administration.  06-13-20: chest x-ray:  Diffuse interstitial opacity with Awanda Mink lines with possible trace pleural fluid. Stable heart size and aortic contours. Negative for air leak  NO NEW EXAMS.      LABS REVIEWED PREVIOUS    03-09-20: wbc 13.7; hgb 12.1; hct 37.7; mcv 80.4 plt 205; glucose 208; bun 21; creat 1.50; k+ 3.2; na++ 141; ca 9.2; hgb a1c 5.9; HIV: nr 03-10-20: wbc 11.2; hgb 11.3; hct 35.9; mcv 82.7 plt 188; glucose 160; bun 18; creat 1.21; k+ 3.3; na++ 139; ca 8.8 liver normal 3.0; mag 2.3 03-12-20: wbc 10.4; hgb 9.3; hct 29.6; mcv 84.3 plt 178; glucose 138; bun 34; creat 1.77; k+ 3.8; na++ 138; ca 8.3 03-14-20: glucose 134; bun 36; creat 1.23; k+ 3.2; na++ 139; ca 8.8 mag 2.1   03-16-20: hgb 10.7; hct 33.4 glucose 127; bun 18; creat 0.86 k+ 3.3; na++ 139; ca 9.2 ;liver normal albumin 2.5 iron 23; tibc 221; ferritin 137 03-23-20: k+ 3.0  03-27-20: glucose 133; bun 34; creat 0.85; k+ 3.6; na++ 142; ca 9.0; AM cortisol: 10.6  05-22-20: wbc 19.4; hgb 14.6; hct 43.8; mcv 81.6 plt 254; glucose 211; bun 35; creat 1.20; k+ 3.0; na++ 138; ca 9.8; liver normal albumin 3.5 mag 1.9; urine culture: <10,000; blood culture: no growth 05-25-20: wbc 14.2; hgb 20.8; hct 34.8; mcv 84.7 plt 212; glucose 109; bun 24; creat 0.86; k+ 3.4; na++ 137; ca 8.9; mag 1.9 05-28-20: wbc 10.9; hgb 11.8; hct 36.8; mcv 81.8 plt 227; glucose 132; bun 10; creat 0.82; k+ 3.0; na++ 137; ca 8.7 06-01-20: wbc 12.2; hgb 12.5; hct 40.3; mcv 81.9 plt 136; glucose 136; bun 12; creat 0.81; k+ 3.0; na++ 140; ca 8.9 mag  1.9  06-05-20: wbc 13.6; hgb 10.2; hct 32.9; mcv 93.5 plt 266 glucose 125; bun 35; creat 1.26; k+ 3.5; na++ 137; ca 8.9  06-07-20: wbc 11.9; hgb 9.6; hct 29.8; mcv 82.8 plt 318; glucose 114; bun 42; creat 1.47; k+ 3.9; na++ 138; ca 8.8 GFR 46; liver normal albumin 2.1  06-08-20: glucose 107; bun 57; creat 1.81; k+ 4.3; na++ 135; ca 8.9 GFR 36 06-09-20: glucose 107; bun 52; creat 1.64; k+ 5.3; na++ 136; ca 9.3;  GFR 40; liver normal albumin 2.3 06-10-20: wbc 12.6; hgb 10.5; hct 34.2; mcv 83.4 plt 417; glucose 126; bun 39; creat 4.4; na++ 137; ca 9.5 GFR 58; liver normal albumin 2.2 06-12-20: wbc 8.2; hgb 10.4; hct 33.6; mcv 81.2 plt 364; glucose 123; bun 24; creat 0.87; k+ 3.5; na++ 138; GFR >60; liver normal albumin 1.9 06-13-20: urine culture: <10,000 colonies 06-14-20: wbc 9.0; hgb 10.5; hct 33.8; mcv 80.5 plt 267; glucose 122; bun 20; creat 0.97; k+3.3; na++ 140; ca 9.0 GFR >60 06-29-20: wbc 10.1; hgb 10.7; hct 36.1; mcv 83.0 plt 294; glucose 152; bun 26; creat 1.02; k+ 4.4; na++ 138 ca 8.8 GFR>60 07-24-20 PSA 16.0 08-14-20: glucose 110; bun 33; creat 1.00 ;k+ 2.5 na++ 140 08-15-20: glucose 114; bun 31; creat  0.84; k+ 3.1; na++ 142 08-21-20: k+ 3.5 10-04-20: albumin 3.2  10-19-20: PSA 12.25 (13.05)   TODAY  12-21-20: wbc 4.6; hgb 13.4 hct 40.9 mcv 86.1 plt 223; glucose 104; bun 22; creat 0.89; k+ 3.0; na++ 143; ca 9.3 GFR>60; liver normal albumin 3.3; chol 128; ldl 71; trig 85 hdl 40     Review of Systems  Constitutional:  Negative for malaise/fatigue.  Respiratory:  Negative for cough and shortness of breath.   Cardiovascular:  Negative for chest pain, palpitations and leg swelling.  Gastrointestinal:  Negative for abdominal pain, constipation and heartburn.  Musculoskeletal:  Negative for back pain, joint pain and myalgias.  Skin: Negative.   Neurological:  Negative for dizziness.  Psychiatric/Behavioral:  The patient is not nervous/anxious.    Physical Exam Constitutional:      General: He is  not in acute distress.    Appearance: He is well-developed. He is not diaphoretic.  Neck:     Thyroid: No thyromegaly.  Cardiovascular:     Rate and Rhythm: Normal rate and regular rhythm.     Pulses: Normal pulses.     Heart sounds: Normal heart sounds.  Pulmonary:     Effort: Pulmonary effort is normal. No respiratory distress.     Breath sounds: Normal breath sounds.  Abdominal:     General: Bowel sounds are normal. There is no distension.     Palpations: Abdomen is soft.     Tenderness: There is no abdominal tenderness.  Musculoskeletal:        General: Normal range of motion.     Cervical back: Neck supple.     Right lower leg: No edema.     Left lower leg: No edema.     Comments: Status post right hip internal fixation 03-10-20     Lymphadenopathy:     Cervical: No cervical adenopathy.  Skin:    General: Skin is warm and dry.  Neurological:     Mental Status: He is alert and oriented to person, place, and time.  Psychiatric:        Mood and Affect: Mood normal.     ASSESSMENT/ PLAN:    TODAY  Protein calorie malnutrition, severe Hypokalemia  Will stop prostat  at this time Will give him k+ 40 meq for 2 doses today Will repeat k+ on 12-25-20.     Ok Edwards NP [facility]  Contact [phone] Monday through Friday 8am- 5pm  After hours call [phone]

## 2020-12-25 ENCOUNTER — Encounter (HOSPITAL_COMMUNITY)
Admission: AD | Admit: 2020-12-25 | Discharge: 2020-12-25 | Disposition: A | Discharge status: Home / Self Care | Payer: Medicare Other | Source: Skilled Nursing Facility | Attending: Adult Health | Admitting: Adult Health

## 2020-12-25 DIAGNOSIS — E876 Hypokalemia: Secondary | ICD-10-CM | POA: Diagnosis not present

## 2020-12-25 LAB — POTASSIUM: Potassium: 3.2 mmol/L — ABNORMAL LOW (ref 3.5–5.1)

## 2020-12-26 DIAGNOSIS — Z1159 Encounter for screening for other viral diseases: Secondary | ICD-10-CM | POA: Diagnosis not present

## 2020-12-26 DIAGNOSIS — K529 Noninfective gastroenteritis and colitis, unspecified: Secondary | ICD-10-CM | POA: Diagnosis not present

## 2020-12-27 DIAGNOSIS — G3184 Mild cognitive impairment, so stated: Secondary | ICD-10-CM | POA: Diagnosis not present

## 2020-12-27 DIAGNOSIS — F411 Generalized anxiety disorder: Secondary | ICD-10-CM | POA: Diagnosis not present

## 2020-12-27 DIAGNOSIS — F321 Major depressive disorder, single episode, moderate: Secondary | ICD-10-CM | POA: Diagnosis not present

## 2020-12-28 DIAGNOSIS — K529 Noninfective gastroenteritis and colitis, unspecified: Secondary | ICD-10-CM | POA: Diagnosis not present

## 2020-12-28 DIAGNOSIS — Z1159 Encounter for screening for other viral diseases: Secondary | ICD-10-CM | POA: Diagnosis not present

## 2020-12-28 DIAGNOSIS — F4323 Adjustment disorder with mixed anxiety and depressed mood: Secondary | ICD-10-CM | POA: Diagnosis not present

## 2021-01-01 ENCOUNTER — Encounter (HOSPITAL_COMMUNITY)
Admission: RE | Admit: 2021-01-01 | Discharge: 2021-01-01 | Disposition: A | Discharge status: Home / Self Care | Payer: Medicare Other | Source: Skilled Nursing Facility | Attending: Adult Health | Admitting: Adult Health

## 2021-01-01 ENCOUNTER — Encounter: Payer: Self-pay | Admitting: Adult Health

## 2021-01-01 ENCOUNTER — Non-Acute Institutional Stay (SKILLED_NURSING_FACILITY): Payer: Medicare Other | Admitting: Adult Health

## 2021-01-01 DIAGNOSIS — E876 Hypokalemia: Secondary | ICD-10-CM | POA: Diagnosis not present

## 2021-01-01 DIAGNOSIS — I10 Essential (primary) hypertension: Secondary | ICD-10-CM

## 2021-01-01 DIAGNOSIS — N3281 Overactive bladder: Secondary | ICD-10-CM | POA: Diagnosis not present

## 2021-01-01 LAB — POTASSIUM: Potassium: 3.2 mmol/L — ABNORMAL LOW (ref 3.5–5.1)

## 2021-01-01 NOTE — Progress Notes (Signed)
Location:  [place] Room Number: 113-D Place of Service:  SNF (31)   CODE STATUS: DNR  Allergies  Allergen Reactions  . Hctz [Hydrochlorothiazide]     hypokalemia    Chief Complaint  Patient presents with  . Acute Visit    Hypokalemia.    HPI:  His k+ level remains at 3.2.  he has had his k+ dose changed last week to 40 meq twice daily. He is continues to have urine leakage. He is taking myrbetriq 50 mg daily. His blood pressure readings have increased since the dose was increased. He did verbalize understanding that his blood is elevated due to medication increase. He is wanting a urology consult for his urinary incontinence.   Past Medical History:  Diagnosis Date  . Anxiety   . Cancer [facility])    prostate  . Chronic chest wall pain   . Essential hypertension, benign 06/20/2016  . GAD (generalized anxiety disorder)   . GERD (gastroesophageal reflux disease)   . H/O echocardiogram 2016   normal  . Heart murmur    "leaking heart valve"  . High cholesterol 06/20/2016  . HOH (hard of hearing)    left  . Hx of falling   . Hyperlipidemia   . Interstitial pulmonary disease ([place])   . MDD (major depressive disorder)   . Murmur, cardiac   . Poor historian   . PUD (peptic ulcer disease)   . RLS (restless legs syndrome)   . Shortness of breath   . Wrist fracture 12/16/2011    Past Surgical History:  Procedure Laterality Date  . CHOLECYSTECTOMY N/A 06/28/2020   Procedure: LAPAROSCOPIC CHOLECYSTECTOMY;  Surgeon: [name], MD;  Location: [facility] ORS;  Service: General;  Laterality: N/A;  pt in [facility]  . COLONOSCOPY  2005   Rehman: Sigmoid colon diverticulosis, moderate sized external hemorrhoids. A few focal areas of mucosal erythema felt to be nonspecific.  Marland Kitchen ESOPHAGOGASTRODUODENOSCOPY    . ESOPHAGOGASTRODUODENOSCOPY N/A 07/18/2016   Rehman: normal exam except duodenal scar. h.pylori serologies negative  . FLEXIBLE SIGMOIDOSCOPY N/A 06/11/2020    Procedure: FLEXIBLE SIGMOIDOSCOPY;  Surgeon: Eloise Harman, DO;  Location: [facility] ENDO SUITE;  Service: Endoscopy;  Laterality: N/A;  . HIP PINNING,CANNULATED Right 03/10/2020   Procedure: INTERNAL FIXATION RIGHT HIP;  Surgeon: Carole Civil, MD;  Location: [facility] ORS;  Service: Orthopedics;  Laterality: Right;  . ORIF WRIST FRACTURE  12/19/2011   Procedure: OPEN REDUCTION INTERNAL FIXATION (ORIF) WRIST FRACTURE;  Surgeon: Carole Civil, MD;  Location: [facility] ORS;  Service: Orthopedics;  Laterality: Right;  . prostate cancer     Diagnosed in the 90s  . TONSILLECTOMY      Social History   Socioeconomic History  . Marital status: Widowed    Spouse name: Not on file  . Number of children: Not on file  . Years of education: Not on file  . Highest education level: Not on file  Occupational History  . Occupation: retired    Fish farm manager: RETIRED  Tobacco Use  . Smoking status: Never  . Smokeless tobacco: Never  Vaping Use  . Vaping Use: Never used  Substance and Sexual Activity  . Alcohol use: No  . Drug use: No  . Sexual activity: Not Currently  Other Topics Concern  . Not on file  Social History Narrative   Is long term resident of [facility]    Social Determinants of Health   Financial Resource Strain: Not on file  Food  Insecurity: Not on file  Transportation Needs: Not on file  Physical Activity: Not on file  Stress: Not on file  Social Connections: Not on file  Intimate Partner Violence: Not on file   Family History  Problem Relation Age of Onset  . Cancer Other   . Heart attack Mother   . Cancer Brother   . Heart attack Brother   . Heart disease Sister        by pass      VITAL SIGNS BP 132/60   Pulse (!) 56   Temp (!) 97.4 F (36.3 C)   Resp 16   Ht '5\' 9"'$  (1.753 m)   Wt 177 lb 12.8 oz (80.6 kg)   SpO2 98%   BMI 26.26 kg/m   Outpatient Encounter Medications as of 01/01/2021  Medication Sig  . acetaminophen (TYLENOL) 500 MG tablet Take 500 mg by mouth  every 6 (six) hours.  Marland Kitchen alfuzosin (UROXATRAL) 10 MG 24 hr tablet Take 10 mg by mouth daily with breakfast.  . Amino Acids-Protein Hydrolys (FEEDING SUPPLEMENT, PRO-STAT SUGAR FREE 64,) LIQD Take 30 mLs by mouth daily at 2 PM.  . Ascorbic Acid (VITAMIN C) 1000 MG tablet Take 1,000 mg by mouth daily.  Marland Kitchen aspirin 81 MG chewable tablet Chew 1 tablet (81 mg total) by mouth daily with breakfast.  . Balsam [place]-Castor Oil (VENELEX) OINT Special Instructions: Apply to bilateral buttocks & sacral area qshift for blanchable erythema.  . calcium carbonate (TUMS - DOSED IN MG ELEMENTAL CALCIUM) 500 MG chewable tablet Chew 1 tablet by mouth 2 (two) times daily as needed for indigestion or heartburn.   . diclofenac Sodium (VOLTAREN) 1 % GEL Apply 4 g topically 2 (two) times daily. Special Instructions: apply 4 gm to left hip twice daily for pain management.  Marland Kitchen diltiazem (CARDIZEM CD) 360 MG 24 hr capsule Take 1 capsule (360 mg total) by mouth daily.  . ferrous sulfate 325 (65 FE) MG tablet Take 325 mg by mouth daily with breakfast. For Anemia and  Iron Deficiency  . Flax Oil-Fish Oil-Borage Oil (FISH OIL-FLAX OIL-BORAGE OIL) CAPS Take 1 capsule by mouth daily.  . hydrALAZINE (APRESOLINE) 50 MG tablet Take 1 tablet (50 mg total) by mouth 3 (three) times daily.  . hydrocortisone cream 1 % Apply 1 application topically 2 (two) times daily.  Marland Kitchen ipratropium-albuterol (DUONEB) 0.5-2.5 (3) MG/3ML SOLN Take 3 mLs by nebulization every 6 (six) hours as needed.  . isosorbide mononitrate (IMDUR) 30 MG 24 hr tablet Take 1 tablet (30 mg total) by mouth daily.  Marland Kitchen lisinopril (ZESTRIL) 40 MG tablet Take 1 tablet (40 mg total) by mouth daily.  Marland Kitchen loperamide (IMODIUM A-D) 2 MG tablet amt: '2mg'$ ; oral Special Instructions: after each stool do not exceed more than 4tabs in 24 hours, As Needed  . Melatonin 10 MG TABS Take 10 mg by mouth at bedtime.  . memantine (NAMENDA) 10 MG tablet Take 10 mg by mouth 2 (two) times daily.  .  methocarbamol (ROBAXIN) 500 MG tablet Take 500 mg by mouth daily in the afternoon.  . metoprolol tartrate (LOPRESSOR) 25 MG tablet Take 1 tablet (25 mg total) by mouth 2 (two) times daily.  . mirabegron ER (MYRBETRIQ) 50 MG TB24 tablet Take 1 tablet (50 mg total) by mouth daily.  . NON FORMULARY Diet: Regular  . omeprazole (PRILOSEC) 20 MG capsule Take 1 capsule (20 mg total) by mouth daily before breakfast.  . ondansetron (ZOFRAN-ODT) 8 MG disintegrating tablet Take  8 mg by mouth 3 (three) times daily before meals. As needed for vomiting and nausea  . OXYGEN Inhale 2 L into the lungs continuous.  . polyethylene glycol (MIRALAX / GLYCOLAX) 17 g packet Take 17 g by mouth daily as needed.  . potassium chloride SA (KLOR-CON) 20 MEQ tablet Take 40 mEq by mouth 2 (two) times daily.  Marland Kitchen rOPINIRole (REQUIP) 0.25 MG tablet Take 0.25 mg by mouth at bedtime.  . salicylic acid 17 % gel Apply 1 application topically in the morning and at bedtime. Apply to warts to left hand  . senna-docusate (SENOKOT-S) 8.6-50 MG tablet Take 1 tablet by mouth at bedtime as needed for mild constipation.  . simethicone (MYLICON) 0000000 MG chewable tablet Chew 125 mg by mouth in the morning, at noon, in the evening, and at bedtime.  Marland Kitchen venlafaxine XR (EFFEXOR-XR) 150 MG 24 hr capsule Take 150 mg by mouth daily with breakfast.   No facility-administered encounter medications on file as of 01/01/2021.     SIGNIFICANT DIAGNOSTIC EXAMS   PREVIOUS   06-09-20: ct of abdomen and pelvis:  Cholelithiasis. Haziness noted around the gallbladder. Cannot exclude acute cholecystitis. Continued wall thickening in the left colon from the distal transverse colon through the distal descending colon concerning for colitis. Left colonic diverticulosis. Small bilateral pleural effusions, increasing since prior study. Bibasilar atelectasis. Coronary artery disease, aortic atherosclerosis.  06-09-20: abdominal ultrasound:  1. Moderate  gallbladder sludge with probable punctate stones. Slight increased wall thickness with small amount of fluid adjacent to the gallbladder but negative sonographic Murphy. Findings are suggestive of but not definitive for cholecystitis; consider correlation with nuclear medicine hepatobiliary imaging. 6 mm gallbladder polyp. 2. Simple appearing renal cysts. 3. Incidental note made of pleural effusions.  06-12-20: HIDA:  1. No scintigraphic evidence of acute cholecystitis. 2. Findings compatible with severe biliary dyskinesia with little to no gallbladder emptying and elicitation of abdominal pain with the CCK administration.  06-13-20: chest x-ray:  Diffuse interstitial opacity with Awanda Mink lines with possible trace pleural fluid. Stable heart size and aortic contours. Negative for air leak  NO NEW EXAMS.    LABS REVIEWED PREVIOUS    03-09-20: wbc 13.7; hgb 12.1; hct 37.7; mcv 80.4 plt 205; glucose 208; bun 21; creat 1.50; k+ 3.2; na++ 141; ca 9.2; hgb a1c 5.9; HIV: nr 03-10-20: wbc 11.2; hgb 11.3; hct 35.9; mcv 82.7 plt 188; glucose 160; bun 18; creat 1.21; k+ 3.3; na++ 139; ca 8.8 liver normal 3.0; mag 2.3 03-12-20: wbc 10.4; hgb 9.3; hct 29.6; mcv 84.3 plt 178; glucose 138; bun 34; creat 1.77; k+ 3.8; na++ 138; ca 8.3 03-14-20: glucose 134; bun 36; creat 1.23; k+ 3.2; na++ 139; ca 8.8 mag 2.1   03-16-20: hgb 10.7; hct 33.4 glucose 127; bun 18; creat 0.86 k+ 3.3; na++ 139; ca 9.2 ;liver normal albumin 2.5 iron 23; tibc 221; ferritin 137 03-23-20: k+ 3.0  03-27-20: glucose 133; bun 34; creat 0.85; k+ 3.6; na++ 142; ca 9.0; AM cortisol: 10.6  05-22-20: wbc 19.4; hgb 14.6; hct 43.8; mcv 81.6 plt 254; glucose 211; bun 35; creat 1.20; k+ 3.0; na++ 138; ca 9.8; liver normal albumin 3.5 mag 1.9; urine culture: <10,000; blood culture: no growth 05-25-20: wbc 14.2; hgb 20.8; hct 34.8; mcv 84.7 plt 212; glucose 109; bun 24; creat 0.86; k+ 3.4; na++ 137; ca 8.9; mag 1.9 05-28-20: wbc 10.9; hgb 11.8; hct 36.8;  mcv 81.8 plt 227; glucose 132; bun 10; creat 0.82; k+ 3.0; na++ 137;  ca 8.7 06-01-20: wbc 12.2; hgb 12.5; hct 40.3; mcv 81.9 plt 136; glucose 136; bun 12; creat 0.81; k+ 3.0; na++ 140; ca 8.9 mag 1.9 06-05-20: wbc 13.6; hgb 10.2; hct 32.9; mcv 93.5 plt 266 glucose 125; bun 35; creat 1.26; k+ 3.5; na++ 137; ca 8.9  06-07-20: wbc 11.9; hgb 9.6; hct 29.8; mcv 82.8 plt 318; glucose 114; bun 42; creat 1.47; k+ 3.9; na++ 138; ca 8.8 GFR 46; liver normal albumin 2.1  06-08-20: glucose 107; bun 57; creat 1.81; k+ 4.3; na++ 135; ca 8.9 GFR 36 06-09-20: glucose 107; bun 52; creat 1.64; k+ 5.3; na++ 136; ca 9.3;  GFR 40; liver normal albumin 2.3 06-10-20: wbc 12.6; hgb 10.5; hct 34.2; mcv 83.4 plt 417; glucose 126; bun 39; creat 4.4; na++ 137; ca 9.5 GFR 58; liver normal albumin 2.2 06-12-20: wbc 8.2; hgb 10.4; hct 33.6; mcv 81.2 plt 364; glucose 123; bun 24; creat 0.87; k+ 3.5; na++ 138; GFR >60; liver normal albumin 1.9 06-13-20: urine culture: <10,000 colonies 06-14-20: wbc 9.0; hgb 10.5; hct 33.8; mcv 80.5 plt 267; glucose 122; bun 20; creat 0.97; k+3.3; na++ 140; ca 9.0 GFR >60 06-29-20: wbc 10.1; hgb 10.7; hct 36.1; mcv 83.0 plt 294; glucose 152; bun 26; creat 1.02; k+ 4.4; na++ 138 ca 8.8 GFR>60 07-24-20 PSA 16.0 08-14-20: glucose 110; bun 33; creat 1.00 ;k+ 2.5 na++ 140 08-15-20: glucose 114; bun 31; creat  0.84; k+ 3.1; na++ 142 08-21-20: k+ 3.5 10-04-20: albumin 3.2  10-19-20: PSA 12.25 (13.05)   TODAY  12-21-20: wbc 4.6; hgb 13.4 hct 40.9 mcv 86.1 plt 223; glucose 104; bun 22; creat 0.89; k+ 3.0; na++ 143; ca 9.3 GFR>60; liver normal albumin 3.3; chol 128; ldl 71; trig 85 hdl 40   12-25-20: k+ 3.2 01-01-21: k+ 3.2   Review of Systems  Constitutional:  Negative for malaise/fatigue.  Respiratory:  Negative for cough and shortness of breath.   Cardiovascular:  Negative for chest pain, palpitations and leg swelling.  Gastrointestinal:  Negative for abdominal pain, constipation and heartburn.  Genitourinary:         Leaks urine   Musculoskeletal:  Negative for back pain, joint pain and myalgias.  Skin: Negative.   Neurological:  Negative for dizziness.  Psychiatric/Behavioral:  The patient is not nervous/anxious.    Physical Exam Constitutional:      General: He is not in acute distress.    Appearance: He is well-developed. He is not diaphoretic.  Neck:     Thyroid: No thyromegaly.  Cardiovascular:     Rate and Rhythm: Normal rate and regular rhythm.     Pulses: Normal pulses.     Heart sounds: Normal heart sounds.  Pulmonary:     Effort: Pulmonary effort is normal. No respiratory distress.     Breath sounds: Normal breath sounds.  Abdominal:     General: Bowel sounds are normal. There is no distension.     Palpations: Abdomen is soft.     Tenderness: There is no abdominal tenderness.  Musculoskeletal:        General: Normal range of motion.     Cervical back: Neck supple.     Right lower leg: No edema.     Left lower leg: No edema.  Lymphadenopathy:     Cervical: No cervical adenopathy.  Skin:    General: Skin is warm and dry.  Neurological:     Mental Status: He is alert and oriented to person, place, and time.  Psychiatric:  Mood and Affect: Mood normal.     ASSESSMENT/ PLAN:  TODAY  Hypokalemia Overactive bladder; Essential hypertension   Will increase k+ to 40 meq three times daily Will check k+ in one week Will lower myrbetriq to 25 mg daily due to hypertension  Will not add to his blood pressure medications; will monitor his status.  He is unable to take urinary antispasmodics due to hip fracture.    Ok Edwards NP [facility]  Contact [phone] Monday through Friday 8am- 5pm  After hours call [phone]

## 2021-01-02 DIAGNOSIS — K529 Noninfective gastroenteritis and colitis, unspecified: Secondary | ICD-10-CM | POA: Diagnosis not present

## 2021-01-02 DIAGNOSIS — Z1159 Encounter for screening for other viral diseases: Secondary | ICD-10-CM | POA: Diagnosis not present

## 2021-01-03 DIAGNOSIS — F4323 Adjustment disorder with mixed anxiety and depressed mood: Secondary | ICD-10-CM | POA: Diagnosis not present

## 2021-01-04 DIAGNOSIS — Z1159 Encounter for screening for other viral diseases: Secondary | ICD-10-CM | POA: Diagnosis not present

## 2021-01-04 DIAGNOSIS — K529 Noninfective gastroenteritis and colitis, unspecified: Secondary | ICD-10-CM | POA: Diagnosis not present

## 2021-01-05 ENCOUNTER — Non-Acute Institutional Stay (SKILLED_NURSING_FACILITY): Payer: Medicare Other | Admitting: Adult Health

## 2021-01-05 ENCOUNTER — Encounter: Payer: Self-pay | Admitting: Adult Health

## 2021-01-05 DIAGNOSIS — I10 Essential (primary) hypertension: Secondary | ICD-10-CM | POA: Diagnosis not present

## 2021-01-05 NOTE — Progress Notes (Signed)
Location:  [facility] Room Number: 113-D Place of Service:  SNF (31)   CODE STATUS: DNR  Allergies  Allergen Reactions  . Hctz [Hydrochlorothiazide]     hypokalemia    Chief Complaint  Patient presents with  . Acute Visit    Blood pressure.    HPI:  His blood pressure readings remain elevated; even with lowering the myrbetriq. He denies any blurring of vision; no headaches. He continues to take cardizem cd; hydralazine; imdur; lisinopril; lopressor.   Past Medical History:  Diagnosis Date  . Anxiety   . Cancer [facility])    prostate  . Chronic chest wall pain   . Essential hypertension, benign 06/20/2016  . GAD (generalized anxiety disorder)   . GERD (gastroesophageal reflux disease)   . H/O echocardiogram 2016   normal  . Heart murmur    "leaking heart valve"  . High cholesterol 06/20/2016  . HOH (hard of hearing)    left  . Hx of falling   . Hyperlipidemia   . Interstitial pulmonary disease ([place])   . MDD (major depressive disorder)   . Murmur, cardiac   . Poor historian   . PUD (peptic ulcer disease)   . RLS (restless legs syndrome)   . Shortness of breath   . Wrist fracture 12/16/2011    Past Surgical History:  Procedure Laterality Date  . CHOLECYSTECTOMY N/A 06/28/2020   Procedure: LAPAROSCOPIC CHOLECYSTECTOMY;  Surgeon: [name], MD;  Location: [facility] ORS;  Service: General;  Laterality: N/A;  pt in [facility]  . COLONOSCOPY  2005   Rehman: Sigmoid colon diverticulosis, moderate sized external hemorrhoids. A few focal areas of mucosal erythema felt to be nonspecific.  Marland Kitchen ESOPHAGOGASTRODUODENOSCOPY    . ESOPHAGOGASTRODUODENOSCOPY N/A 07/18/2016   Rehman: normal exam except duodenal scar. h.pylori serologies negative  . FLEXIBLE SIGMOIDOSCOPY N/A 06/11/2020   Procedure: FLEXIBLE SIGMOIDOSCOPY;  Surgeon: Eloise Harman, DO;  Location: [facility] ENDO SUITE;  Service: Endoscopy;  Laterality: N/A;  . HIP PINNING,CANNULATED Right 03/10/2020    Procedure: INTERNAL FIXATION RIGHT HIP;  Surgeon: Carole Civil, MD;  Location: [facility] ORS;  Service: Orthopedics;  Laterality: Right;  . ORIF WRIST FRACTURE  12/19/2011   Procedure: OPEN REDUCTION INTERNAL FIXATION (ORIF) WRIST FRACTURE;  Surgeon: Carole Civil, MD;  Location: [facility] ORS;  Service: Orthopedics;  Laterality: Right;  . prostate cancer     Diagnosed in the 90s  . TONSILLECTOMY      Social History   Socioeconomic History  . Marital status: Widowed    Spouse name: Not on file  . Number of children: Not on file  . Years of education: Not on file  . Highest education level: Not on file  Occupational History  . Occupation: retired    Fish farm manager: RETIRED  Tobacco Use  . Smoking status: Never  . Smokeless tobacco: Never  Vaping Use  . Vaping Use: Never used  Substance and Sexual Activity  . Alcohol use: No  . Drug use: No  . Sexual activity: Not Currently  Other Topics Concern  . Not on file  Social History Narrative   Is long term resident of [facility]    Social Determinants of Health   Financial Resource Strain: Not on file  Food Insecurity: Not on file  Transportation Needs: Not on file  Physical Activity: Not on file  Stress: Not on file  Social Connections: Not on file  Intimate Partner Violence: Not on file   Family History  Problem Relation Age of Onset  . Cancer Other   . Heart attack Mother   . Cancer Brother   . Heart attack Brother   . Heart disease Sister        by pass      VITAL SIGNS BP (!) 170/82   Pulse 67   Temp (!) 97.4 F (36.3 C)   Resp 16   Ht '5\' 9"'$  (1.753 m)   Wt 177 lb 6.4 oz (80.5 kg)   SpO2 98%   BMI 26.20 kg/m   Outpatient Encounter Medications as of 01/05/2021  Medication Sig  . acetaminophen (TYLENOL) 500 MG tablet Take 500 mg by mouth every 6 (six) hours.  Marland Kitchen alfuzosin (UROXATRAL) 10 MG 24 hr tablet Take 10 mg by mouth daily with breakfast.  . Amino Acids-Protein Hydrolys (FEEDING SUPPLEMENT, PRO-STAT SUGAR FREE 64,)  LIQD Take 30 mLs by mouth daily at 2 PM.  . Ascorbic Acid (VITAMIN C) 1000 MG tablet Take 1,000 mg by mouth daily.  Marland Kitchen aspirin 81 MG chewable tablet Chew 1 tablet (81 mg total) by mouth daily with breakfast.  . Balsam [place]-Castor Oil (VENELEX) OINT Special Instructions: Apply to bilateral buttocks & sacral area qshift for blanchable erythema.  . calcium carbonate (TUMS - DOSED IN MG ELEMENTAL CALCIUM) 500 MG chewable tablet Chew 1 tablet by mouth 2 (two) times daily as needed for indigestion or heartburn.   . diltiazem (CARDIZEM CD) 360 MG 24 hr capsule Take 1 capsule (360 mg total) by mouth daily.  . ferrous sulfate 325 (65 FE) MG tablet Take 325 mg by mouth daily with breakfast. For Anemia and  Iron Deficiency  . Flax Oil-Fish Oil-Borage Oil (FISH OIL-FLAX OIL-BORAGE OIL) CAPS Take 1 capsule by mouth daily.  . hydrALAZINE (APRESOLINE) 50 MG tablet Take 1 tablet (50 mg total) by mouth 3 (three) times daily.  Marland Kitchen ipratropium-albuterol (DUONEB) 0.5-2.5 (3) MG/3ML SOLN Take 3 mLs by nebulization every 6 (six) hours as needed.  . isosorbide mononitrate (IMDUR) 30 MG 24 hr tablet Take 1 tablet (30 mg total) by mouth daily.  Marland Kitchen lisinopril (ZESTRIL) 40 MG tablet Take 1 tablet (40 mg total) by mouth daily.  Marland Kitchen loperamide (IMODIUM A-D) 2 MG tablet amt: '2mg'$ ; oral Special Instructions: after each stool do not exceed more than 4tabs in 24 hours, As Needed  . Melatonin 10 MG TABS Take 10 mg by mouth at bedtime.  . memantine (NAMENDA) 10 MG tablet Take 10 mg by mouth 2 (two) times daily.  . methocarbamol (ROBAXIN) 500 MG tablet Take 500 mg by mouth daily in the afternoon.  . metoprolol tartrate (LOPRESSOR) 25 MG tablet Take 1 tablet (25 mg total) by mouth 2 (two) times daily.  . mirabegron ER (MYRBETRIQ) 50 MG TB24 tablet Take 1 tablet (50 mg total) by mouth daily.  . NON FORMULARY Diet: Regular  . omeprazole (PRILOSEC) 20 MG capsule Take 1 capsule (20 mg total) by mouth daily before breakfast.  . ondansetron  (ZOFRAN-ODT) 8 MG disintegrating tablet Take 8 mg by mouth 3 (three) times daily before meals. As needed for vomiting and nausea  . OXYGEN Inhale 2 L into the lungs continuous.  . polyethylene glycol (MIRALAX / GLYCOLAX) 17 g packet Take 17 g by mouth daily as needed.  . potassium chloride SA (KLOR-CON) 20 MEQ tablet Take 40 mEq by mouth 2 (two) times daily.  Marland Kitchen rOPINIRole (REQUIP) 0.25 MG tablet Take 0.25 mg by mouth at bedtime.  . senna-docusate (SENOKOT-S) 8.6-50 MG  tablet Take 1 tablet by mouth at bedtime as needed for mild constipation.  . simethicone (MYLICON) 0000000 MG chewable tablet Chew 125 mg by mouth in the morning, at noon, in the evening, and at bedtime.  Marland Kitchen venlafaxine XR (EFFEXOR-XR) 150 MG 24 hr capsule Take 150 mg by mouth daily with breakfast.  . [DISCONTINUED] diclofenac Sodium (VOLTAREN) 1 % GEL Apply 4 g topically 2 (two) times daily. Special Instructions: apply 4 gm to left hip twice daily for pain management.  . [DISCONTINUED] hydrocortisone cream 1 % Apply 1 application topically 2 (two) times daily.  . [DISCONTINUED] salicylic acid 17 % gel Apply 1 application topically in the morning and at bedtime. Apply to warts to left hand   No facility-administered encounter medications on file as of 01/05/2021.     SIGNIFICANT DIAGNOSTIC EXAMS   PREVIOUS   06-09-20: ct of abdomen and pelvis:  Cholelithiasis. Haziness noted around the gallbladder. Cannot exclude acute cholecystitis. Continued wall thickening in the left colon from the distal transverse colon through the distal descending colon concerning for colitis. Left colonic diverticulosis. Small bilateral pleural effusions, increasing since prior study. Bibasilar atelectasis. Coronary artery disease, aortic atherosclerosis.  06-09-20: abdominal ultrasound:  1. Moderate gallbladder sludge with probable punctate stones. Slight increased wall thickness with small amount of fluid adjacent to the gallbladder but negative  sonographic Murphy. Findings are suggestive of but not definitive for cholecystitis; consider correlation with nuclear medicine hepatobiliary imaging. 6 mm gallbladder polyp. 2. Simple appearing renal cysts. 3. Incidental note made of pleural effusions.  06-12-20: HIDA:  1. No scintigraphic evidence of acute cholecystitis. 2. Findings compatible with severe biliary dyskinesia with little to no gallbladder emptying and elicitation of abdominal pain with the CCK administration.  06-13-20: chest x-ray:  Diffuse interstitial opacity with Awanda Mink lines with possible trace pleural fluid. Stable heart size and aortic contours. Negative for air leak  NO NEW EXAMS.    LABS REVIEWED PREVIOUS    03-09-20: wbc 13.7; hgb 12.1; hct 37.7; mcv 80.4 plt 205; glucose 208; bun 21; creat 1.50; k+ 3.2; na++ 141; ca 9.2; hgb a1c 5.9; HIV: nr 03-10-20: wbc 11.2; hgb 11.3; hct 35.9; mcv 82.7 plt 188; glucose 160; bun 18; creat 1.21; k+ 3.3; na++ 139; ca 8.8 liver normal 3.0; mag 2.3 03-12-20: wbc 10.4; hgb 9.3; hct 29.6; mcv 84.3 plt 178; glucose 138; bun 34; creat 1.77; k+ 3.8; na++ 138; ca 8.3 03-14-20: glucose 134; bun 36; creat 1.23; k+ 3.2; na++ 139; ca 8.8 mag 2.1   03-16-20: hgb 10.7; hct 33.4 glucose 127; bun 18; creat 0.86 k+ 3.3; na++ 139; ca 9.2 ;liver normal albumin 2.5 iron 23; tibc 221; ferritin 137 03-23-20: k+ 3.0  03-27-20: glucose 133; bun 34; creat 0.85; k+ 3.6; na++ 142; ca 9.0; AM cortisol: 10.6  05-22-20: wbc 19.4; hgb 14.6; hct 43.8; mcv 81.6 plt 254; glucose 211; bun 35; creat 1.20; k+ 3.0; na++ 138; ca 9.8; liver normal albumin 3.5 mag 1.9; urine culture: <10,000; blood culture: no growth 05-25-20: wbc 14.2; hgb 20.8; hct 34.8; mcv 84.7 plt 212; glucose 109; bun 24; creat 0.86; k+ 3.4; na++ 137; ca 8.9; mag 1.9 05-28-20: wbc 10.9; hgb 11.8; hct 36.8; mcv 81.8 plt 227; glucose 132; bun 10; creat 0.82; k+ 3.0; na++ 137; ca 8.7 06-01-20: wbc 12.2; hgb 12.5; hct 40.3; mcv 81.9 plt 136; glucose 136; bun  12; creat 0.81; k+ 3.0; na++ 140; ca 8.9 mag 1.9 06-05-20: wbc 13.6; hgb 10.2; hct 32.9; mcv 93.5  plt 266 glucose 125; bun 35; creat 1.26; k+ 3.5; na++ 137; ca 8.9  06-07-20: wbc 11.9; hgb 9.6; hct 29.8; mcv 82.8 plt 318; glucose 114; bun 42; creat 1.47; k+ 3.9; na++ 138; ca 8.8 GFR 46; liver normal albumin 2.1  06-08-20: glucose 107; bun 57; creat 1.81; k+ 4.3; na++ 135; ca 8.9 GFR 36 06-09-20: glucose 107; bun 52; creat 1.64; k+ 5.3; na++ 136; ca 9.3;  GFR 40; liver normal albumin 2.3 06-10-20: wbc 12.6; hgb 10.5; hct 34.2; mcv 83.4 plt 417; glucose 126; bun 39; creat 4.4; na++ 137; ca 9.5 GFR 58; liver normal albumin 2.2 06-12-20: wbc 8.2; hgb 10.4; hct 33.6; mcv 81.2 plt 364; glucose 123; bun 24; creat 0.87; k+ 3.5; na++ 138; GFR >60; liver normal albumin 1.9 06-13-20: urine culture: <10,000 colonies 06-14-20: wbc 9.0; hgb 10.5; hct 33.8; mcv 80.5 plt 267; glucose 122; bun 20; creat 0.97; k+3.3; na++ 140; ca 9.0 GFR >60 06-29-20: wbc 10.1; hgb 10.7; hct 36.1; mcv 83.0 plt 294; glucose 152; bun 26; creat 1.02; k+ 4.4; na++ 138 ca 8.8 GFR>60 07-24-20 PSA 16.0 08-14-20: glucose 110; bun 33; creat 1.00 ;k+ 2.5 na++ 140 08-15-20: glucose 114; bun 31; creat  0.84; k+ 3.1; na++ 142 08-21-20: k+ 3.5 10-04-20: albumin 3.2  10-19-20: PSA 12.25 (13.05)  12-21-20: wbc 4.6; hgb 13.4 hct 40.9 mcv 86.1 plt 223; glucose 104; bun 22; creat 0.89; k+ 3.0; na++ 143; ca 9.3 GFR>60; liver normal albumin 3.3; chol 128; ldl 71; trig 85 hdl 40   12-25-20: k+ 3.2 01-01-21: k+ 3.2  NO NEW LABS.    Review of Systems  Constitutional:  Negative for malaise/fatigue.  Respiratory:  Negative for cough and shortness of breath.   Cardiovascular:  Negative for chest pain, palpitations and leg swelling.  Gastrointestinal:  Negative for abdominal pain, constipation and heartburn.  Musculoskeletal:  Negative for back pain, joint pain and myalgias.  Skin: Negative.   Neurological:  Negative for dizziness.  Psychiatric/Behavioral:  The  patient is not nervous/anxious.    Physical Exam Constitutional:      General: He is not in acute distress.    Appearance: He is well-developed. He is not diaphoretic.  Neck:     Thyroid: No thyromegaly.  Cardiovascular:     Rate and Rhythm: Normal rate and regular rhythm.     Pulses: Normal pulses.     Heart sounds: Normal heart sounds.  Pulmonary:     Effort: Pulmonary effort is normal. No respiratory distress.     Breath sounds: Normal breath sounds.  Abdominal:     General: Bowel sounds are normal. There is no distension.     Palpations: Abdomen is soft.     Tenderness: There is no abdominal tenderness.  Musculoskeletal:        General: Normal range of motion.     Cervical back: Neck supple.     Right lower leg: No edema.     Left lower leg: No edema.  Lymphadenopathy:     Cervical: No cervical adenopathy.  Skin:    General: Skin is warm and dry.  Neurological:     Mental Status: He is alert and oriented to person, place, and time.  Psychiatric:        Mood and Affect: Mood normal.     ASSESSMENT/ PLAN:  TODAY  Essential hypertension: is worse will increase hydralazine to 100 mg every 8 hours will monitor his status.    Ok Edwards NP [facility]  Contact [phone] Monday through Friday 8am- 5pm  After hours call [phone]

## 2021-01-08 ENCOUNTER — Encounter (HOSPITAL_COMMUNITY)
Admission: RE | Admit: 2021-01-08 | Discharge: 2021-01-08 | Disposition: A | Discharge status: Home / Self Care | Payer: Medicare Other | Source: Skilled Nursing Facility | Attending: Adult Health | Admitting: Adult Health

## 2021-01-08 DIAGNOSIS — E876 Hypokalemia: Secondary | ICD-10-CM | POA: Diagnosis not present

## 2021-01-08 LAB — POTASSIUM: Potassium: 4.5 mmol/L (ref 3.5–5.1)

## 2021-01-09 DIAGNOSIS — Z1159 Encounter for screening for other viral diseases: Secondary | ICD-10-CM | POA: Diagnosis not present

## 2021-01-09 DIAGNOSIS — K529 Noninfective gastroenteritis and colitis, unspecified: Secondary | ICD-10-CM | POA: Diagnosis not present

## 2021-01-10 ENCOUNTER — Ambulatory Visit (INDEPENDENT_AMBULATORY_CARE_PROVIDER_SITE_OTHER): Payer: Medicare Other | Admitting: Orthopedic Surgery

## 2021-01-10 ENCOUNTER — Encounter: Payer: Self-pay | Admitting: Internal Medicine

## 2021-01-10 ENCOUNTER — Non-Acute Institutional Stay (SKILLED_NURSING_FACILITY): Payer: Medicare Other | Admitting: Internal Medicine

## 2021-01-10 ENCOUNTER — Inpatient Hospital Stay: Payer: Medicare Other

## 2021-01-10 ENCOUNTER — Other Ambulatory Visit: Payer: Self-pay

## 2021-01-10 ENCOUNTER — Encounter: Payer: Self-pay | Admitting: Orthopedic Surgery

## 2021-01-10 VITALS — BP 162/87 | HR 66 | Ht 69.0 in | Wt 177.0 lb

## 2021-01-10 DIAGNOSIS — N3281 Overactive bladder: Secondary | ICD-10-CM

## 2021-01-10 DIAGNOSIS — M7061 Trochanteric bursitis, right hip: Secondary | ICD-10-CM | POA: Diagnosis not present

## 2021-01-10 DIAGNOSIS — C61 Malignant neoplasm of prostate: Secondary | ICD-10-CM | POA: Diagnosis not present

## 2021-01-10 DIAGNOSIS — S72001A Fracture of unspecified part of neck of right femur, initial encounter for closed fracture: Secondary | ICD-10-CM

## 2021-01-10 DIAGNOSIS — N1832 Chronic kidney disease, stage 3b: Secondary | ICD-10-CM

## 2021-01-10 DIAGNOSIS — S72001D Fracture of unspecified part of neck of right femur, subsequent encounter for closed fracture with routine healing: Secondary | ICD-10-CM

## 2021-01-10 DIAGNOSIS — I10 Essential (primary) hypertension: Secondary | ICD-10-CM | POA: Diagnosis not present

## 2021-01-10 DIAGNOSIS — R079 Chest pain, unspecified: Secondary | ICD-10-CM

## 2021-01-10 DIAGNOSIS — J849 Interstitial pulmonary disease, unspecified: Secondary | ICD-10-CM | POA: Diagnosis not present

## 2021-01-10 NOTE — Assessment & Plan Note (Addendum)
RA O2 sats 98%; no further evaluation or intervention indicated

## 2021-01-10 NOTE — Assessment & Plan Note (Signed)
10/19/20 further drop in PSA to 12.25 suggests stable chemical recurrence. Prognosis dictated by other co-morbidities not recurrent prostate cancer

## 2021-01-10 NOTE — Assessment & Plan Note (Signed)
BP controlled; no change in antihypertensive medications  

## 2021-01-10 NOTE — Progress Notes (Addendum)
Chief Complaint  Patient presents with   Hip Pain    Right hip history of surgery 03/10/20 hip fixation with cannulated screws     87 year old male had cannulated screw fixation of his right hip fracture in October he complains of pain on his right side when he lays on his right side or when he lies on his back and he complains of pain in the area of the incision when he is walking.  He still limping and is here for evaluation  Physical Exam Musculoskeletal:     Comments: His incision is healed with no erythema he has some tenderness and crepitance over the incision he also has some pain just above the incision on the right iliac crest he has good hip flexion with no pain and he has limited internal rotation but no pain with internal rotation his leg lengths remained relatively equal in the setting of his knee flexion contracture from arthritis   X-rays were done in the office his last x-ray was March 14 for cannulated screws with washers are noted in the right hip with good impaction of the fracture  Today's x-ray fracture did heal screws backed off a little bit  I told Mr. Jewitt that we can give him an injection every month until October at which time we could consider removing the screws however if his pain is solved with injection then that would be better than surgery  Will call us on an as-needed basis  Chronic illness to hip fracture with side effect of treatment screws backed out, internal imaging, minor surgical procedure injection with minimal procedure risk factors of the injection pain allergy reaction  Inject right hip Procedure note  Injection  Verbal consent was obtained to inject the right hip  Timeout procedure was completed to confirm injection site  Diagnosis bursitis right hip  Medications used Celestone 6 mg Lidocaine 1% plain 3 cc  Anesthesia was provided by ethyl chloride spray  Prep was performed with alcohol  Technique of injection the patient was on  his left side point of maximal tenderness was found and injected  Encounter Diagnoses  Name Primary?   Closed displaced fracture of right femoral neck (HCC) Yes   Trochanteric bursitis, right hip    Hip fracture requiring operative repair, right, closed, with routine healing, subsequent encounter      No complications were noted

## 2021-01-10 NOTE — Assessment & Plan Note (Signed)
Current creat 0.89/GFR > 60,CKD Stage 2 Medication List reviewed.If CKD progresses ACE-I will be weaned or discontinued.

## 2021-01-10 NOTE — Patient Instructions (Signed)
See assessment and plan under each diagnosis in the problem list and acutely for this visit 

## 2021-01-10 NOTE — Assessment & Plan Note (Signed)
He wants to pursue Urolift procedure

## 2021-01-10 NOTE — Assessment & Plan Note (Signed)
>>  ASSESSMENT AND PLAN FOR CHRONIC KIDNEY DISEASE, STAGE II (MILD) WRITTEN ON 01/10/2021  1:50 PM BY HOPPER, Titus Dubin, MD  Current creat 0.89/GFR > 60,CKD Stage 2 Medication List reviewed.If CKD progresses ACE-I will be weaned or discontinued.

## 2021-01-10 NOTE — Assessment & Plan Note (Addendum)
He describes intermittent left chest pain at night.  He believes he was told this was neuromuscular in etiology rather than anginal.

## 2021-01-10 NOTE — Progress Notes (Signed)
NURSING HOME LOCATION:   [facility] ROOM NUMBER:  113 D  CODE STATUS:  DNR  PCP:  Ok Edwards NP  This is a nursing facility follow up visit & to document compliance with Regulation 483.30 (c) in [employer] Phase 2 which mandates caregiver visit ( visits can alternate among physician, PA or NP as per statutes) within 10 days of 30 days / 60 days/ 90 days post admission to SNF date    Interim medical record and care since last SNF visit was updated with review of diagnostic studies and change in clinical status since last visit were documented.  HPI: He is a permanent resident of this facility with medical diagnoses of essential hypertension, history of prostate cancer, GERD, dyslipidemia, history of PUD, RLS, anxiety/depression, and interstitial pulmonary disease. He has had extensive surgeries and procedures including cholecystectomy, EGD, and colonoscopy. There is no history of smoking or alcohol intake.  Review of systems: He saw his Orthopedist Dr. Aline Brochure today who told him that the screws in the hip were loose and backing out.  He received an injection which is to be repeated next month.  In October he will be reassessed to see if the screws should be removed.  He was seen because of recurrent pain in this area. His other main complaint is urinary frequency especially at night occurring every 30-120 minutes.  He showed me a brochure from his Urologist's office for the UroLift procedure to address BPH symptoms.  Dr. Patsy Baltimore had administered Eligard as palliative therapy because of the history of prostate cancer. He states he does have intermittent chest pain at night which he believes is neuromuscular and manifested as a "grabbing pain".  He denies any exertional chest pain. He describes frequent belching but no other GI symptoms.  Stools are like "axle grease" which he relates to the iron supplement.  Constitutional: No fever, significant  weight change  Eyes: No redness, discharge, pain, vision change ENT/mouth: No nasal congestion,  purulent discharge, earache, change in hearing, sore throat  Cardiovascular: No  palpitations, paroxysmal nocturnal dyspnea, claudication, edema  Respiratory: No cough, sputum production, hemoptysis, significant snoring, apnea   Gastrointestinal: No heartburn, dysphagia, abdominal pain, nausea /vomiting, rectal bleeding Genitourinary: No dysuria, hematuria, pyuria Dermatologic: No rash, pruritus, change in appearance of skin Neurologic: No dizziness, headache, syncope, seizures, numbness, tingling Psychiatric: No significant anxiety, depression, insomnia, anorexia Endocrine: No change in hair/skin/nails, excessive thirst, excessive hunger  Hematologic/lymphatic: No significant bruising, lymphadenopathy, abnormal bleeding Allergy/immunology: No itchy/watery eyes, significant sneezing, urticaria, angioedema  Physical exam:  Pertinent or positive findings: Pattern alopecia is present.  He has only 6 mandibular teeth remaining.  There is a grade 1/2 systolic murmur at the base.  Abdomen is protuberant.  Pedal pulses are decreased, especially the dorsalis pedis pulses.  He has 1/2+ edema.  General appearance: Adequately nourished; no acute distress, increased work of breathing is present.   Lymphatic: No lymphadenopathy about the head, neck, axilla. Eyes: No conjunctival inflammation or lid edema is present. There is no scleral icterus. Ears:  External ear exam shows no significant lesions or deformities.   Nose:  External nasal examination shows no deformity or inflammation. Nasal mucosa are pink and moist without lesions, exudates Oral exam:  There is no oropharyngeal erythema or exudate. Neck:  No thyromegaly, masses, tenderness noted.    Heart:  Normal rate and regular rhythm. S1 and S2 normal without gallop,  click, rub .  Lungs:  Chest clear to auscultation without wheezes, rhonchi, rales,  rubs. Abdomen: Bowel sounds are normal. Abdomen is soft and nontender with no organomegaly, hernias, masses. GU: Deferred  Extremities:  No cyanosis, clubbing  Neurologic exam :Balance, Rhomberg, finger to nose testing could not be completed due to clinical state Skin: Warm & dry w/o tenting. No significant lesions or rash.  See summary under each active problem in the Problem List with associated updated therapeutic plan

## 2021-01-11 ENCOUNTER — Non-Acute Institutional Stay (SKILLED_NURSING_FACILITY): Payer: Medicare Other | Admitting: Adult Health

## 2021-01-11 ENCOUNTER — Encounter: Payer: Self-pay | Admitting: Adult Health

## 2021-01-11 DIAGNOSIS — F4323 Adjustment disorder with mixed anxiety and depressed mood: Secondary | ICD-10-CM | POA: Diagnosis not present

## 2021-01-11 DIAGNOSIS — F339 Major depressive disorder, recurrent, unspecified: Secondary | ICD-10-CM

## 2021-01-11 DIAGNOSIS — I10 Essential (primary) hypertension: Secondary | ICD-10-CM

## 2021-01-11 NOTE — Progress Notes (Signed)
Location:  [place] Room Number: 113-D Place of Service:  SNF (31)   CODE STATUS: DNR  Allergies  Allergen Reactions   Hctz [Hydrochlorothiazide]     hypokalemia    Chief Complaint  Patient presents with   Acute Visit    Mood concerns     HPI:  He has seen orthopedics regarding his hip pain. He did receive a steroid injection. He tells me that the pin is coming loose. He does worry about this; he is describing poor sleep. He has recently had his effexor reduced to 75 mg daily. Since that time he tells me that his anxiety has gotten worse.  He will need to  return back to 150 mg daily. His blood pressure readings are elevated: in the 170's/80-90's.  He denies any chest pain; no palpitations; no blurring of vision. He is presently taking cardizem cd; hydralazine; imdur; lisinopril.   Past Medical History:  Diagnosis Date   Anxiety    Cancer [facility])    prostate   Chronic chest wall pain    Essential hypertension, benign 06/20/2016   GAD (generalized anxiety disorder)    GERD (gastroesophageal reflux disease)    H/O echocardiogram 2016   normal   Heart murmur    "leaking heart valve"   High cholesterol 06/20/2016   HOH (hard of hearing)    left   Hx of falling    Interstitial pulmonary disease (HCC)    MDD (major depressive disorder)    Murmur, cardiac    Poor historian    PUD (peptic ulcer disease)    RLS (restless legs syndrome)    Shortness of breath    Wrist fracture 12/16/2011    Past Surgical History:  Procedure Laterality Date   CHOLECYSTECTOMY N/A 06/28/2020   Procedure: LAPAROSCOPIC CHOLECYSTECTOMY;  Surgeon: [name], MD;  Location: [facility] ORS;  Service: General;  Laterality: N/A;  pt in [place]  2005   Rehman: Sigmoid colon diverticulosis, moderate sized external hemorrhoids. A few focal areas of mucosal erythema felt to be nonspecific.   ESOPHAGOGASTRODUODENOSCOPY     ESOPHAGOGASTRODUODENOSCOPY N/A  07/18/2016   Rehman: normal exam except duodenal scar. h.pylori serologies negative   FLEXIBLE SIGMOIDOSCOPY N/A 06/11/2020   Procedure: FLEXIBLE SIGMOIDOSCOPY;  Surgeon: Eloise Harman, DO;  Location: [facility] ENDO SUITE;  Service: Endoscopy;  Laterality: N/A;   HIP PINNING,CANNULATED Right 03/10/2020   Procedure: INTERNAL FIXATION RIGHT HIP;  Surgeon: Carole Civil, MD;  Location: [facility] ORS;  Service: Orthopedics;  Laterality: Right;   ORIF WRIST FRACTURE  12/19/2011   Procedure: OPEN REDUCTION INTERNAL FIXATION (ORIF) WRIST FRACTURE;  Surgeon: Carole Civil, MD;  Location: [facility] OR[facility];  Service: Orthopedics;  Laterality: Right;   prostate cancer     Diagnosed in the 90s   TONSILLECTOMY      Social History   Socioeconomic History   Marital status: Widowed    Spouse name: Not on file   Number of children: Not on file   Years of education: Not on file   Highest education level: Not on file  Occupational History   Occupation: retired    Fish farm manager: RETIRED  Tobacco Use   Smoking status: Never   Smokeless tobacco: Never  Vaping Use   Vaping Use: Never used  Substance and Sexual Activity   Alcohol use: No   Drug use: No   Sexual activity: Not Currently  Other Topics Concern   Not on file  Social  History Narrative   Is long term resident of [facility]    Social Determinants of Health   Financial Resource Strain: Not on file  Food Insecurity: Not on file  Transportation Needs: Not on file  Physical Activity: Not on file  Stress: Not on file  Social Connections: Not on file  Intimate Partner Violence: Not on file   Family History  Problem Relation Age of Onset   Cancer Other    Heart attack Mother    Cancer Brother    Heart attack Brother    Heart disease Sister        by pass      VITAL SIGNS BP (!) 175/87   Pulse 70   Temp (!) 97.3 F (36.3 C)   Resp 16   Ht '5\' 9"'$  (1.753 m)   Wt 178 lb 3.2 oz (80.8 kg)   SpO2 98%   BMI 26.32 kg/m   Outpatient Encounter Medications  as of 01/11/2021  Medication Sig   acetaminophen (TYLENOL) 500 MG tablet Take 500 mg by mouth every 6 (six) hours.   alfuzosin (UROXATRAL) 10 MG 24 hr tablet Take 10 mg by mouth daily with breakfast.   Ascorbic Acid (VITAMIN C) 1000 MG tablet Take 1,000 mg by mouth daily.   aspirin 81 MG chewable tablet Chew 1 tablet (81 mg total) by mouth daily with breakfast.   Balsam [place]-Castor Oil (VENELEX) OINT Special Instructions: Apply to bilateral buttocks & sacral area qshift for blanchable erythema.   calcium carbonate (TUMS - DOSED IN MG ELEMENTAL CALCIUM) 500 MG chewable tablet Chew 1 tablet by mouth 2 (two) times daily as needed for indigestion or heartburn.    diltiazem (CARDIZEM CD) 360 MG 24 hr capsule Take 1 capsule (360 mg total) by mouth daily.   ferrous sulfate 325 (65 FE) MG tablet Take 325 mg by mouth daily with breakfast. For Anemia and  Iron Deficiency   Flax Oil-Fish Oil-Borage Oil (FISH OIL-FLAX OIL-BORAGE OIL) CAPS Take 1 capsule by mouth daily.   hydrALAZINE (APRESOLINE) 50 MG tablet Take 1 tablet (50 mg total) by mouth 3 (three) times daily.   ipratropium-albuterol (DUONEB) 0.5-2.5 (3) MG/3ML SOLN Take 3 mLs by nebulization every 6 (six) hours as needed.   isosorbide mononitrate (IMDUR) 30 MG 24 hr tablet Take 1 tablet (30 mg total) by mouth daily.   lisinopril (ZESTRIL) 40 MG tablet Take 1 tablet (40 mg total) by mouth daily.   loperamide (IMODIUM A-D) 2 MG tablet amt: '2mg'$ ; oral Special Instructions: after each stool do not exceed more than 4tabs in 24 hours, As Needed   Melatonin 10 MG TABS Take 10 mg by mouth at bedtime.   memantine (NAMENDA) 10 MG tablet Take 10 mg by mouth 2 (two) times daily.   methocarbamol (ROBAXIN) 500 MG tablet Take 500 mg by mouth daily in the afternoon.   metoprolol tartrate (LOPRESSOR) 25 MG tablet Take 1 tablet (25 mg total) by mouth 2 (two) times daily.   mirabegron ER (MYRBETRIQ) 50 MG TB24 tablet Take 1 tablet (50 mg total) by mouth daily.   NON  FORMULARY Diet: Regular   omeprazole (PRILOSEC) 20 MG capsule Take 1 capsule (20 mg total) by mouth daily before breakfast.   ondansetron (ZOFRAN-ODT) 8 MG disintegrating tablet Take 8 mg by mouth 3 (three) times daily before meals. As needed for vomiting and nausea   OXYGEN Inhale 2 L into the lungs continuous.   polyethylene glycol (MIRALAX / GLYCOLAX) 17 g packet Take  17 g by mouth daily as needed.   potassium chloride SA (KLOR-CON) 20 MEQ tablet Take 40 mEq by mouth 3 (three) times daily.   rOPINIRole (REQUIP) 0.25 MG tablet Take 0.25 mg by mouth at bedtime.   senna-docusate (SENOKOT-S) 8.6-50 MG tablet Take 1 tablet by mouth at bedtime as needed for mild constipation.   simethicone (MYLICON) 0000000 MG chewable tablet Chew 125 mg by mouth in the morning, at noon, in the evening, and at bedtime.   venlafaxine XR (EFFEXOR-XR) 75 MG 24 hr capsule Take 75 mg by mouth daily with breakfast.   [DISCONTINUED] Amino Acids-Protein Hydrolys (FEEDING SUPPLEMENT, PRO-STAT SUGAR FREE 64,) LIQD Take 30 mLs by mouth daily at 2 PM.   [DISCONTINUED] venlafaxine XR (EFFEXOR-XR) 150 MG 24 hr capsule Take 150 mg by mouth daily with breakfast.   No facility-administered encounter medications on file as of 01/11/2021.     SIGNIFICANT DIAGNOSTIC EXAMS  PREVIOUS   06-09-20: ct of abdomen and pelvis:  Cholelithiasis. Haziness noted around the gallbladder. Cannot exclude acute cholecystitis. Continued wall thickening in the left colon from the distal transverse colon through the distal descending colon concerning for colitis. Left colonic diverticulosis. Small bilateral pleural effusions, increasing since prior study. Bibasilar atelectasis. Coronary artery disease, aortic atherosclerosis.  06-09-20: abdominal ultrasound:  1. Moderate gallbladder sludge with probable punctate stones. Slight increased wall thickness with small amount of fluid adjacent to the gallbladder but negative sonographic Murphy. Findings are  suggestive of but not definitive for cholecystitis; consider correlation with nuclear medicine hepatobiliary imaging. 6 mm gallbladder polyp. 2. Simple appearing renal cysts. 3. Incidental note made of pleural effusions.  06-12-20: HIDA:  1. No scintigraphic evidence of acute cholecystitis. 2. Findings compatible with severe biliary dyskinesia with little to no gallbladder emptying and elicitation of abdominal pain with the CCK administration.  06-13-20: chest x-ray:  Diffuse interstitial opacity with Awanda Mink lines with possible trace pleural fluid. Stable heart size and aortic contours. Negative for air leak  NO NEW EXAMS.    LABS REVIEWED PREVIOUS    03-09-20: wbc 13.7; hgb 12.1; hct 37.7; mcv 80.4 plt 205; glucose 208; bun 21; creat 1.50; k+ 3.2; na++ 141; ca 9.2; hgb a1c 5.9; HIV: nr 03-10-20: wbc 11.2; hgb 11.3; hct 35.9; mcv 82.7 plt 188; glucose 160; bun 18; creat 1.21; k+ 3.3; na++ 139; ca 8.8 liver normal 3.0; mag 2.3 03-12-20: wbc 10.4; hgb 9.3; hct 29.6; mcv 84.3 plt 178; glucose 138; bun 34; creat 1.77; k+ 3.8; na++ 138; ca 8.3 03-14-20: glucose 134; bun 36; creat 1.23; k+ 3.2; na++ 139; ca 8.8 mag 2.1   03-16-20: hgb 10.7; hct 33.4 glucose 127; bun 18; creat 0.86 k+ 3.3; na++ 139; ca 9.2 ;liver normal albumin 2.5 iron 23; tibc 221; ferritin 137 03-23-20: k+ 3.0  03-27-20: glucose 133; bun 34; creat 0.85; k+ 3.6; na++ 142; ca 9.0; AM cortisol: 10.6  05-22-20: wbc 19.4; hgb 14.6; hct 43.8; mcv 81.6 plt 254; glucose 211; bun 35; creat 1.20; k+ 3.0; na++ 138; ca 9.8; liver normal albumin 3.5 mag 1.9; urine culture: <10,000; blood culture: no growth 05-25-20: wbc 14.2; hgb 20.8; hct 34.8; mcv 84.7 plt 212; glucose 109; bun 24; creat 0.86; k+ 3.4; na++ 137; ca 8.9; mag 1.9 05-28-20: wbc 10.9; hgb 11.8; hct 36.8; mcv 81.8 plt 227; glucose 132; bun 10; creat 0.82; k+ 3.0; na++ 137; ca 8.7 06-01-20: wbc 12.2; hgb 12.5; hct 40.3; mcv 81.9 plt 136; glucose 136; bun 12; creat 0.81; k+ 3.0;  na++ 140;  ca 8.9 mag 1.9 06-05-20: wbc 13.6; hgb 10.2; hct 32.9; mcv 93.5 plt 266 glucose 125; bun 35; creat 1.26; k+ 3.5; na++ 137; ca 8.9  06-07-20: wbc 11.9; hgb 9.6; hct 29.8; mcv 82.8 plt 318; glucose 114; bun 42; creat 1.47; k+ 3.9; na++ 138; ca 8.8 GFR 46; liver normal albumin 2.1  06-08-20: glucose 107; bun 57; creat 1.81; k+ 4.3; na++ 135; ca 8.9 GFR 36 06-09-20: glucose 107; bun 52; creat 1.64; k+ 5.3; na++ 136; ca 9.3;  GFR 40; liver normal albumin 2.3 06-10-20: wbc 12.6; hgb 10.5; hct 34.2; mcv 83.4 plt 417; glucose 126; bun 39; creat 4.4; na++ 137; ca 9.5 GFR 58; liver normal albumin 2.2 06-12-20: wbc 8.2; hgb 10.4; hct 33.6; mcv 81.2 plt 364; glucose 123; bun 24; creat 0.87; k+ 3.5; na++ 138; GFR >60; liver normal albumin 1.9 06-13-20: urine culture: <10,000 colonies 06-14-20: wbc 9.0; hgb 10.5; hct 33.8; mcv 80.5 plt 267; glucose 122; bun 20; creat 0.97; k+3.3; na++ 140; ca 9.0 GFR >60 06-29-20: wbc 10.1; hgb 10.7; hct 36.1; mcv 83.0 plt 294; glucose 152; bun 26; creat 1.02; k+ 4.4; na++ 138 ca 8.8 GFR>60 07-24-20 PSA 16.0 08-14-20: glucose 110; bun 33; creat 1.00 ;k+ 2.5 na++ 140 08-15-20: glucose 114; bun 31; creat  0.84; k+ 3.1; na++ 142 08-21-20: k+ 3.5 10-04-20: albumin 3.2  10-19-20: PSA 12.25 (13.05)  12-21-20: wbc 4.6; hgb 13.4 hct 40.9 mcv 86.1 plt 223; glucose 104; bun 22; creat 0.89; k+ 3.0; na++ 143; ca 9.3 GFR>60; liver normal albumin 3.3; chol 128; ldl 71; trig 85 hdl 40   12-25-20: k+ 3.2 01-01-21: k+ 3.2  NO NEW LABS.    Review of Systems  Constitutional:  Negative for malaise/fatigue.  Respiratory:  Negative for cough and shortness of breath.   Cardiovascular:  Negative for chest pain, palpitations and leg swelling.  Gastrointestinal:  Negative for abdominal pain, constipation and heartburn.  Musculoskeletal:  Negative for back pain, joint pain and myalgias.  Skin: Negative.   Neurological:  Negative for dizziness.  Psychiatric/Behavioral:  Positive for depression. The patient is  nervous/anxious and has insomnia.     Physical Exam Constitutional:      General: He is not in acute distress.    Appearance: He is well-developed. He is not diaphoretic.  Neck:     Thyroid: No thyromegaly.  Cardiovascular:     Rate and Rhythm: Normal rate and regular rhythm.     Heart sounds: Normal heart sounds.  Pulmonary:     Effort: Pulmonary effort is normal. No respiratory distress.     Breath sounds: Normal breath sounds.  Abdominal:     General: Bowel sounds are normal. There is no distension.     Palpations: Abdomen is soft.     Tenderness: There is no abdominal tenderness.  Musculoskeletal:        General: Normal range of motion.     Cervical back: Neck supple.  Lymphadenopathy:     Cervical: No cervical adenopathy.  Skin:    General: Skin is warm and dry.  Neurological:     Mental Status: He is alert and oriented to person, place, and time.  Psychiatric:        Mood and Affect: Mood normal.     ASSESSMENT/ PLAN:  TODAY  Essential hypertension Major depression; recurrent chronic  Is worse:   Will increase effexor xr to 150 mg daily Will stop lisinopril  Will begin lisinopril/hctz: 40/12.5 mg daily  Will check BMP 01-18-21 Will monitor his status.    Ok Edwards NP [facility]  Contact [phone] Monday through Friday 8am- 5pm  After hours call [phone]

## 2021-01-15 DIAGNOSIS — Z9181 History of falling: Secondary | ICD-10-CM | POA: Diagnosis not present

## 2021-01-15 DIAGNOSIS — M5136 Other intervertebral disc degeneration, lumbar region: Secondary | ICD-10-CM | POA: Diagnosis not present

## 2021-01-15 DIAGNOSIS — M6281 Muscle weakness (generalized): Secondary | ICD-10-CM | POA: Diagnosis not present

## 2021-01-15 DIAGNOSIS — K529 Noninfective gastroenteritis and colitis, unspecified: Secondary | ICD-10-CM | POA: Diagnosis not present

## 2021-01-15 DIAGNOSIS — M545 Low back pain, unspecified: Secondary | ICD-10-CM | POA: Diagnosis not present

## 2021-01-15 DIAGNOSIS — R262 Difficulty in walking, not elsewhere classified: Secondary | ICD-10-CM | POA: Diagnosis not present

## 2021-01-16 DIAGNOSIS — M545 Low back pain, unspecified: Secondary | ICD-10-CM | POA: Diagnosis not present

## 2021-01-16 DIAGNOSIS — R262 Difficulty in walking, not elsewhere classified: Secondary | ICD-10-CM | POA: Diagnosis not present

## 2021-01-16 DIAGNOSIS — Z9181 History of falling: Secondary | ICD-10-CM | POA: Diagnosis not present

## 2021-01-16 DIAGNOSIS — M5136 Other intervertebral disc degeneration, lumbar region: Secondary | ICD-10-CM | POA: Diagnosis not present

## 2021-01-16 DIAGNOSIS — K529 Noninfective gastroenteritis and colitis, unspecified: Secondary | ICD-10-CM | POA: Diagnosis not present

## 2021-01-16 DIAGNOSIS — M6281 Muscle weakness (generalized): Secondary | ICD-10-CM | POA: Diagnosis not present

## 2021-01-17 DIAGNOSIS — M6281 Muscle weakness (generalized): Secondary | ICD-10-CM | POA: Diagnosis not present

## 2021-01-17 DIAGNOSIS — R262 Difficulty in walking, not elsewhere classified: Secondary | ICD-10-CM | POA: Diagnosis not present

## 2021-01-17 DIAGNOSIS — Z9181 History of falling: Secondary | ICD-10-CM | POA: Diagnosis not present

## 2021-01-17 DIAGNOSIS — K529 Noninfective gastroenteritis and colitis, unspecified: Secondary | ICD-10-CM | POA: Diagnosis not present

## 2021-01-17 DIAGNOSIS — M5136 Other intervertebral disc degeneration, lumbar region: Secondary | ICD-10-CM | POA: Diagnosis not present

## 2021-01-17 DIAGNOSIS — M545 Low back pain, unspecified: Secondary | ICD-10-CM | POA: Diagnosis not present

## 2021-01-19 ENCOUNTER — Other Ambulatory Visit (HOSPITAL_COMMUNITY)
Admission: RE | Admit: 2021-01-19 | Discharge: 2021-01-19 | Disposition: A | Discharge status: Home / Self Care | Payer: Medicare Other | Source: Skilled Nursing Facility | Attending: Adult Health | Admitting: Adult Health

## 2021-01-19 DIAGNOSIS — I10 Essential (primary) hypertension: Secondary | ICD-10-CM | POA: Insufficient documentation

## 2021-01-19 DIAGNOSIS — R262 Difficulty in walking, not elsewhere classified: Secondary | ICD-10-CM | POA: Diagnosis not present

## 2021-01-19 DIAGNOSIS — M5136 Other intervertebral disc degeneration, lumbar region: Secondary | ICD-10-CM | POA: Diagnosis not present

## 2021-01-19 DIAGNOSIS — M6281 Muscle weakness (generalized): Secondary | ICD-10-CM | POA: Diagnosis not present

## 2021-01-19 DIAGNOSIS — M545 Low back pain, unspecified: Secondary | ICD-10-CM | POA: Diagnosis not present

## 2021-01-19 DIAGNOSIS — Z9181 History of falling: Secondary | ICD-10-CM | POA: Diagnosis not present

## 2021-01-19 DIAGNOSIS — K529 Noninfective gastroenteritis and colitis, unspecified: Secondary | ICD-10-CM | POA: Diagnosis not present

## 2021-01-19 LAB — BASIC METABOLIC PANEL
Anion gap: 8 (ref 5–15)
BUN: 20 mg/dL (ref 8–23)
CO2: 29 mmol/L (ref 22–32)
Calcium: 9 mg/dL (ref 8.9–10.3)
Chloride: 102 mmol/L (ref 98–111)
Creatinine, Ser: 0.96 mg/dL (ref 0.61–1.24)
GFR, Estimated: >60 mL/min (ref >60)
Glucose, Bld: 108 mg/dL — ABNORMAL HIGH (ref 70–99)
Potassium: 3.6 mmol/L (ref 3.5–5.1)
Sodium: 139 mmol/L (ref 135–145)

## 2021-01-20 DIAGNOSIS — M5136 Other intervertebral disc degeneration, lumbar region: Secondary | ICD-10-CM | POA: Diagnosis not present

## 2021-01-20 DIAGNOSIS — M545 Low back pain, unspecified: Secondary | ICD-10-CM | POA: Diagnosis not present

## 2021-01-20 DIAGNOSIS — K529 Noninfective gastroenteritis and colitis, unspecified: Secondary | ICD-10-CM | POA: Diagnosis not present

## 2021-01-20 DIAGNOSIS — M6281 Muscle weakness (generalized): Secondary | ICD-10-CM | POA: Diagnosis not present

## 2021-01-20 DIAGNOSIS — Z9181 History of falling: Secondary | ICD-10-CM | POA: Diagnosis not present

## 2021-01-20 DIAGNOSIS — R262 Difficulty in walking, not elsewhere classified: Secondary | ICD-10-CM | POA: Diagnosis not present

## 2021-01-22 DIAGNOSIS — M5136 Other intervertebral disc degeneration, lumbar region: Secondary | ICD-10-CM | POA: Diagnosis not present

## 2021-01-22 DIAGNOSIS — M6281 Muscle weakness (generalized): Secondary | ICD-10-CM | POA: Diagnosis not present

## 2021-01-22 DIAGNOSIS — M545 Low back pain, unspecified: Secondary | ICD-10-CM | POA: Diagnosis not present

## 2021-01-22 DIAGNOSIS — R262 Difficulty in walking, not elsewhere classified: Secondary | ICD-10-CM | POA: Diagnosis not present

## 2021-01-22 DIAGNOSIS — Z9181 History of falling: Secondary | ICD-10-CM | POA: Diagnosis not present

## 2021-01-22 DIAGNOSIS — K529 Noninfective gastroenteritis and colitis, unspecified: Secondary | ICD-10-CM | POA: Diagnosis not present

## 2021-01-23 DIAGNOSIS — Z9181 History of falling: Secondary | ICD-10-CM | POA: Diagnosis not present

## 2021-01-23 DIAGNOSIS — M545 Low back pain, unspecified: Secondary | ICD-10-CM | POA: Diagnosis not present

## 2021-01-23 DIAGNOSIS — R262 Difficulty in walking, not elsewhere classified: Secondary | ICD-10-CM | POA: Diagnosis not present

## 2021-01-23 DIAGNOSIS — K529 Noninfective gastroenteritis and colitis, unspecified: Secondary | ICD-10-CM | POA: Diagnosis not present

## 2021-01-23 DIAGNOSIS — M5136 Other intervertebral disc degeneration, lumbar region: Secondary | ICD-10-CM | POA: Diagnosis not present

## 2021-01-23 DIAGNOSIS — M6281 Muscle weakness (generalized): Secondary | ICD-10-CM | POA: Diagnosis not present

## 2021-01-25 DIAGNOSIS — K529 Noninfective gastroenteritis and colitis, unspecified: Secondary | ICD-10-CM | POA: Diagnosis not present

## 2021-01-25 DIAGNOSIS — M6281 Muscle weakness (generalized): Secondary | ICD-10-CM | POA: Diagnosis not present

## 2021-01-25 DIAGNOSIS — F4323 Adjustment disorder with mixed anxiety and depressed mood: Secondary | ICD-10-CM | POA: Diagnosis not present

## 2021-01-25 DIAGNOSIS — M5136 Other intervertebral disc degeneration, lumbar region: Secondary | ICD-10-CM | POA: Diagnosis not present

## 2021-01-25 DIAGNOSIS — R262 Difficulty in walking, not elsewhere classified: Secondary | ICD-10-CM | POA: Diagnosis not present

## 2021-01-25 DIAGNOSIS — M545 Low back pain, unspecified: Secondary | ICD-10-CM | POA: Diagnosis not present

## 2021-01-25 DIAGNOSIS — Z9181 History of falling: Secondary | ICD-10-CM | POA: Diagnosis not present

## 2021-01-26 DIAGNOSIS — M5136 Other intervertebral disc degeneration, lumbar region: Secondary | ICD-10-CM | POA: Diagnosis not present

## 2021-01-26 DIAGNOSIS — Z9181 History of falling: Secondary | ICD-10-CM | POA: Diagnosis not present

## 2021-01-26 DIAGNOSIS — M545 Low back pain, unspecified: Secondary | ICD-10-CM | POA: Diagnosis not present

## 2021-01-26 DIAGNOSIS — K529 Noninfective gastroenteritis and colitis, unspecified: Secondary | ICD-10-CM | POA: Diagnosis not present

## 2021-01-26 DIAGNOSIS — M6281 Muscle weakness (generalized): Secondary | ICD-10-CM | POA: Diagnosis not present

## 2021-01-26 DIAGNOSIS — R262 Difficulty in walking, not elsewhere classified: Secondary | ICD-10-CM | POA: Diagnosis not present

## 2021-01-27 DIAGNOSIS — Z9181 History of falling: Secondary | ICD-10-CM | POA: Diagnosis not present

## 2021-01-27 DIAGNOSIS — M6281 Muscle weakness (generalized): Secondary | ICD-10-CM | POA: Diagnosis not present

## 2021-01-27 DIAGNOSIS — R262 Difficulty in walking, not elsewhere classified: Secondary | ICD-10-CM | POA: Diagnosis not present

## 2021-01-27 DIAGNOSIS — M545 Low back pain, unspecified: Secondary | ICD-10-CM | POA: Diagnosis not present

## 2021-01-27 DIAGNOSIS — K529 Noninfective gastroenteritis and colitis, unspecified: Secondary | ICD-10-CM | POA: Diagnosis not present

## 2021-01-27 DIAGNOSIS — M5136 Other intervertebral disc degeneration, lumbar region: Secondary | ICD-10-CM | POA: Diagnosis not present

## 2021-01-29 DIAGNOSIS — M5136 Other intervertebral disc degeneration, lumbar region: Secondary | ICD-10-CM | POA: Diagnosis not present

## 2021-01-29 DIAGNOSIS — M545 Low back pain, unspecified: Secondary | ICD-10-CM | POA: Diagnosis not present

## 2021-01-29 DIAGNOSIS — R262 Difficulty in walking, not elsewhere classified: Secondary | ICD-10-CM | POA: Diagnosis not present

## 2021-01-29 DIAGNOSIS — K529 Noninfective gastroenteritis and colitis, unspecified: Secondary | ICD-10-CM | POA: Diagnosis not present

## 2021-01-29 DIAGNOSIS — M6281 Muscle weakness (generalized): Secondary | ICD-10-CM | POA: Diagnosis not present

## 2021-01-29 DIAGNOSIS — Z9181 History of falling: Secondary | ICD-10-CM | POA: Diagnosis not present

## 2021-01-30 DIAGNOSIS — R262 Difficulty in walking, not elsewhere classified: Secondary | ICD-10-CM | POA: Diagnosis not present

## 2021-01-30 DIAGNOSIS — M5136 Other intervertebral disc degeneration, lumbar region: Secondary | ICD-10-CM | POA: Diagnosis not present

## 2021-01-30 DIAGNOSIS — Z9181 History of falling: Secondary | ICD-10-CM | POA: Diagnosis not present

## 2021-01-30 DIAGNOSIS — M545 Low back pain, unspecified: Secondary | ICD-10-CM | POA: Diagnosis not present

## 2021-01-30 DIAGNOSIS — K529 Noninfective gastroenteritis and colitis, unspecified: Secondary | ICD-10-CM | POA: Diagnosis not present

## 2021-01-30 DIAGNOSIS — M6281 Muscle weakness (generalized): Secondary | ICD-10-CM | POA: Diagnosis not present

## 2021-01-31 DIAGNOSIS — M6281 Muscle weakness (generalized): Secondary | ICD-10-CM | POA: Diagnosis not present

## 2021-01-31 DIAGNOSIS — Z9181 History of falling: Secondary | ICD-10-CM | POA: Diagnosis not present

## 2021-01-31 DIAGNOSIS — K529 Noninfective gastroenteritis and colitis, unspecified: Secondary | ICD-10-CM | POA: Diagnosis not present

## 2021-01-31 DIAGNOSIS — R262 Difficulty in walking, not elsewhere classified: Secondary | ICD-10-CM | POA: Diagnosis not present

## 2021-01-31 DIAGNOSIS — M545 Low back pain, unspecified: Secondary | ICD-10-CM | POA: Diagnosis not present

## 2021-01-31 DIAGNOSIS — M5136 Other intervertebral disc degeneration, lumbar region: Secondary | ICD-10-CM | POA: Diagnosis not present

## 2021-02-01 ENCOUNTER — Non-Acute Institutional Stay (SKILLED_NURSING_FACILITY): Payer: Medicare Other | Admitting: Adult Health

## 2021-02-01 DIAGNOSIS — F339 Major depressive disorder, recurrent, unspecified: Secondary | ICD-10-CM

## 2021-02-01 DIAGNOSIS — M6281 Muscle weakness (generalized): Secondary | ICD-10-CM | POA: Diagnosis not present

## 2021-02-01 DIAGNOSIS — K529 Noninfective gastroenteritis and colitis, unspecified: Secondary | ICD-10-CM | POA: Diagnosis not present

## 2021-02-01 DIAGNOSIS — J849 Interstitial pulmonary disease, unspecified: Secondary | ICD-10-CM | POA: Diagnosis not present

## 2021-02-01 DIAGNOSIS — M5136 Other intervertebral disc degeneration, lumbar region: Secondary | ICD-10-CM | POA: Diagnosis not present

## 2021-02-01 DIAGNOSIS — I129 Hypertensive chronic kidney disease with stage 1 through stage 4 chronic kidney disease, or unspecified chronic kidney disease: Secondary | ICD-10-CM

## 2021-02-01 DIAGNOSIS — N183 Chronic kidney disease, stage 3 unspecified: Secondary | ICD-10-CM | POA: Diagnosis not present

## 2021-02-01 DIAGNOSIS — R262 Difficulty in walking, not elsewhere classified: Secondary | ICD-10-CM | POA: Diagnosis not present

## 2021-02-01 DIAGNOSIS — F4323 Adjustment disorder with mixed anxiety and depressed mood: Secondary | ICD-10-CM | POA: Diagnosis not present

## 2021-02-01 DIAGNOSIS — I700 Atherosclerosis of aorta: Secondary | ICD-10-CM | POA: Diagnosis not present

## 2021-02-01 DIAGNOSIS — M545 Low back pain, unspecified: Secondary | ICD-10-CM | POA: Diagnosis not present

## 2021-02-01 DIAGNOSIS — Z9181 History of falling: Secondary | ICD-10-CM | POA: Diagnosis not present

## 2021-02-01 NOTE — Progress Notes (Signed)
Location:  [facility] [place] Room Number: 113 Place of Service:  SNF (31)   CODE STATUS: dnr  Allergies  Allergen Reactions   Hctz [Hydrochlorothiazide]     hypokalemia    Chief Complaint  Patient presents with   Acute Visit    Care plan meeting.     HPI:  We have come together for his care plan meeting.  BIMS 15/15 mood 6/30: decreased energy; not sleeping well. He is independent to supervision with his adls. He is continent of bladder and bowel. He is able to feed himself. No falls. Dietary   weight is 178.3 pounds stable regular diet good appetite. His blood pressure reading remain elevated. Therapy: OT for adls. He continues to be followed for his chronic illnesses including: Benign hypertension with chronic kidney disease stage 3  Aortic atherosclerosis  Major depression recurrent depression  Interstitial lung disease  Past Medical History:  Diagnosis Date   Anxiety    Cancer [facility])    prostate   Chronic chest wall pain    Essential hypertension, benign 06/20/2016   GAD (generalized anxiety disorder)    GERD (gastroesophageal reflux disease)    H/O echocardiogram 2016   normal   Heart murmur    "leaking heart valve"   High cholesterol 06/20/2016   HOH (hard of hearing)    left   Hx of falling    Interstitial pulmonary disease (HCC)    MDD (major depressive disorder)    Murmur, cardiac    Poor historian    PUD (peptic ulcer disease)    RLS (restless legs syndrome)    Shortness of breath    Wrist fracture 12/16/2011    Past Surgical History:  Procedure Laterality Date   CHOLECYSTECTOMY N/A 06/28/2020   Procedure: LAPAROSCOPIC CHOLECYSTECTOMY;  Surgeon: [name], MD;  Location: [facility] ORS;  Service: General;  Laterality: N/A;  pt in [place]  2005   Rehman: Sigmoid colon diverticulosis, moderate sized external hemorrhoids. A few focal areas of mucosal erythema felt to be nonspecific.   ESOPHAGOGASTRODUODENOSCOPY      ESOPHAGOGASTRODUODENOSCOPY N/A 07/18/2016   Rehman: normal exam except duodenal scar. h.pylori serologies negative   FLEXIBLE SIGMOIDOSCOPY N/A 06/11/2020   Procedure: FLEXIBLE SIGMOIDOSCOPY;  Surgeon: Eloise Harman, DO;  Location: [facility] SUITE;  Service: Endoscopy;  Laterality: N/A;   HIP PINNING,CANNULATED Right 03/10/2020   Procedure: INTERNAL FIXATION RIGHT HIP;  Surgeon: Carole Civil, MD;  Location: [facility] ORS;  Service: Orthopedics;  Laterality: Right;   ORIF WRIST FRACTURE  12/19/2011   Procedure: OPEN REDUCTION INTERNAL FIXATION (ORIF) WRIST FRACTURE;  Surgeon: Carole Civil, MD;  Location: [facility];  Service: Orthopedics;  Laterality: Right;   prostate cancer     Diagnosed in the 90s   TONSILLECTOMY      Social History   Socioeconomic History   Marital status: Widowed    Spouse name: Not on file   Number of children: Not on file   Years of education: Not on file   Highest education level: Not on file  Occupational History   Occupation: retired    Fish farm manager: RETIRED  Tobacco Use   Smoking status: Never   Smokeless tobacco: Never  Vaping Use   Vaping Use: Never used  Substance and Sexual Activity   Alcohol use: No   Drug use: No   Sexual activity: Not Currently  Other Topics Concern   Not on file  Social History Narrative  Is long term resident of [facility]    Social Determinants of Health   Financial Resource Strain: Not on file  Food Insecurity: Not on file  Transportation Needs: Not on file  Physical Activity: Not on file  Stress: Not on file  Social Connections: Not on file  Intimate Partner Violence: Not on file   Family History  Problem Relation Age of Onset   Cancer Other    Heart attack Mother    Cancer Brother    Heart attack Brother    Heart disease Sister        by pass      VITAL SIGNS BP (!) 170/73   Pulse 64   Temp 98 F (36.7 C)   Ht '5\' 9"'$  (1.753 m)   Wt 175 lb 6.4 oz (79.6 kg)   BMI 25.90 kg/m   Outpatient Encounter  Medications as of 02/01/2021  Medication Sig   acetaminophen (TYLENOL) 500 MG tablet Take 500 mg by mouth every 6 (six) hours.   alfuzosin (UROXATRAL) 10 MG 24 hr tablet Take 10 mg by mouth daily with breakfast.   Ascorbic Acid (VITAMIN C) 1000 MG tablet Take 1,000 mg by mouth daily.   aspirin 81 MG chewable tablet Chew 1 tablet (81 mg total) by mouth daily with breakfast.   Balsam [place]-Castor Oil (VENELEX) OINT Special Instructions: Apply to bilateral buttocks & sacral area qshift for blanchable erythema.   calcium carbonate (TUMS - DOSED IN MG ELEMENTAL CALCIUM) 500 MG chewable tablet Chew 1 tablet by mouth 2 (two) times daily as needed for indigestion or heartburn.    diltiazem (CARDIZEM CD) 360 MG 24 hr capsule Take 1 capsule (360 mg total) by mouth daily.   ferrous sulfate 325 (65 FE) MG tablet Take 325 mg by mouth daily with breakfast. For Anemia and  Iron Deficiency   Flax Oil-Fish Oil-Borage Oil (FISH OIL-FLAX OIL-BORAGE OIL) CAPS Take 1 capsule by mouth daily.   hydrALAZINE (APRESOLINE) 50 MG tablet Take 1 tablet (50 mg total) by mouth 3 (three) times daily.   ipratropium-albuterol (DUONEB) 0.5-2.5 (3) MG/3ML SOLN Take 3 mLs by nebulization every 6 (six) hours as needed.   isosorbide mononitrate (IMDUR) 30 MG 24 hr tablet Take 1 tablet (30 mg total) by mouth daily.   lisinopril (ZESTRIL) 40 MG tablet Take 1 tablet (40 mg total) by mouth daily.   loperamide (IMODIUM A-D) 2 MG tablet amt: '2mg'$ ; oral Special Instructions: after each stool do not exceed more than 4tabs in 24 hours, As Needed   Melatonin 10 MG TABS Take 10 mg by mouth at bedtime.   memantine (NAMENDA) 10 MG tablet Take 10 mg by mouth 2 (two) times daily.   methocarbamol (ROBAXIN) 500 MG tablet Take 500 mg by mouth daily in the afternoon.   metoprolol tartrate (LOPRESSOR) 25 MG tablet Take 1 tablet (25 mg total) by mouth 2 (two) times daily.   mirabegron ER (MYRBETRIQ) 50 MG TB24 tablet Take 1 tablet (50 mg total) by mouth  daily.   NON FORMULARY Diet: Regular   omeprazole (PRILOSEC) 20 MG capsule Take 1 capsule (20 mg total) by mouth daily before breakfast.   ondansetron (ZOFRAN-ODT) 8 MG disintegrating tablet Take 8 mg by mouth 3 (three) times daily before meals. As needed for vomiting and nausea   OXYGEN Inhale 2 L into the lungs continuous.   polyethylene glycol (MIRALAX / GLYCOLAX) 17 g packet Take 17 g by mouth daily as needed.   potassium chloride SA (KLOR-CON)  20 MEQ tablet Take 40 mEq by mouth 3 (three) times daily.   rOPINIRole (REQUIP) 0.25 MG tablet Take 0.25 mg by mouth at bedtime.   senna-docusate (SENOKOT-S) 8.6-50 MG tablet Take 1 tablet by mouth at bedtime as needed for mild constipation.   simethicone (MYLICON) 0000000 MG chewable tablet Chew 125 mg by mouth in the morning, at noon, in the evening, and at bedtime.   venlafaxine XR (EFFEXOR-XR) 75 MG 24 hr capsule Take 75 mg by mouth daily with breakfast.   No facility-administered encounter medications on file as of 02/01/2021.     SIGNIFICANT DIAGNOSTIC EXAMS  PREVIOUS   06-09-20: ct of abdomen and pelvis:  Cholelithiasis. Haziness noted around the gallbladder. Cannot exclude acute cholecystitis. Continued wall thickening in the left colon from the distal transverse colon through the distal descending colon concerning for colitis. Left colonic diverticulosis. Small bilateral pleural effusions, increasing since prior study. Bibasilar atelectasis. Coronary artery disease, aortic atherosclerosis.  06-09-20: abdominal ultrasound:  1. Moderate gallbladder sludge with probable punctate stones. Slight increased wall thickness with small amount of fluid adjacent to the gallbladder but negative sonographic Murphy. Findings are suggestive of but not definitive for cholecystitis; consider correlation with nuclear medicine hepatobiliary imaging. 6 mm gallbladder polyp. 2. Simple appearing renal cysts. 3. Incidental note made of pleural  effusions.  06-12-20: HIDA:  1. No scintigraphic evidence of acute cholecystitis. 2. Findings compatible with severe biliary dyskinesia with little to no gallbladder emptying and elicitation of abdominal pain with the CCK administration.  06-13-20: chest x-ray:  Diffuse interstitial opacity with Awanda Mink lines with possible trace pleural fluid. Stable heart size and aortic contours. Negative for air leak  NO NEW EXAMS.    LABS REVIEWED PREVIOUS    03-09-20: wbc 13.7; hgb 12.1; hct 37.7; mcv 80.4 plt 205; glucose 208; bun 21; creat 1.50; k+ 3.2; na++ 141; ca 9.2; hgb a1c 5.9; HIV: nr 03-10-20: wbc 11.2; hgb 11.3; hct 35.9; mcv 82.7 plt 188; glucose 160; bun 18; creat 1.21; k+ 3.3; na++ 139; ca 8.8 liver normal 3.0; mag 2.3 03-12-20: wbc 10.4; hgb 9.3; hct 29.6; mcv 84.3 plt 178; glucose 138; bun 34; creat 1.77; k+ 3.8; na++ 138; ca 8.3 03-14-20: glucose 134; bun 36; creat 1.23; k+ 3.2; na++ 139; ca 8.8 mag 2.1   03-16-20: hgb 10.7; hct 33.4 glucose 127; bun 18; creat 0.86 k+ 3.3; na++ 139; ca 9.2 ;liver normal albumin 2.5 iron 23; tibc 221; ferritin 137 03-23-20: k+ 3.0  03-27-20: glucose 133; bun 34; creat 0.85; k+ 3.6; na++ 142; ca 9.0; AM cortisol: 10.6  05-22-20: wbc 19.4; hgb 14.6; hct 43.8; mcv 81.6 plt 254; glucose 211; bun 35; creat 1.20; k+ 3.0; na++ 138; ca 9.8; liver normal albumin 3.5 mag 1.9; urine culture: <10,000; blood culture: no growth 05-25-20: wbc 14.2; hgb 20.8; hct 34.8; mcv 84.7 plt 212; glucose 109; bun 24; creat 0.86; k+ 3.4; na++ 137; ca 8.9; mag 1.9 05-28-20: wbc 10.9; hgb 11.8; hct 36.8; mcv 81.8 plt 227; glucose 132; bun 10; creat 0.82; k+ 3.0; na++ 137; ca 8.7 06-01-20: wbc 12.2; hgb 12.5; hct 40.3; mcv 81.9 plt 136; glucose 136; bun 12; creat 0.81; k+ 3.0; na++ 140; ca 8.9 mag 1.9 06-05-20: wbc 13.6; hgb 10.2; hct 32.9; mcv 93.5 plt 266 glucose 125; bun 35; creat 1.26; k+ 3.5; na++ 137; ca 8.9  06-07-20: wbc 11.9; hgb 9.6; hct 29.8; mcv 82.8 plt 318; glucose 114; bun 42;  creat 1.47; k+ 3.9; na++ 138; ca 8.8  GFR 46; liver normal albumin 2.1  06-08-20: glucose 107; bun 57; creat 1.81; k+ 4.3; na++ 135; ca 8.9 GFR 36 06-09-20: glucose 107; bun 52; creat 1.64; k+ 5.3; na++ 136; ca 9.3;  GFR 40; liver normal albumin 2.3 06-10-20: wbc 12.6; hgb 10.5; hct 34.2; mcv 83.4 plt 417; glucose 126; bun 39; creat 4.4; na++ 137; ca 9.5 GFR 58; liver normal albumin 2.2 06-12-20: wbc 8.2; hgb 10.4; hct 33.6; mcv 81.2 plt 364; glucose 123; bun 24; creat 0.87; k+ 3.5; na++ 138; GFR >60; liver normal albumin 1.9 06-13-20: urine culture: <10,000 colonies 06-14-20: wbc 9.0; hgb 10.5; hct 33.8; mcv 80.5 plt 267; glucose 122; bun 20; creat 0.97; k+3.3; na++ 140; ca 9.0 GFR >60 06-29-20: wbc 10.1; hgb 10.7; hct 36.1; mcv 83.0 plt 294; glucose 152; bun 26; creat 1.02; k+ 4.4; na++ 138 ca 8.8 GFR>60 07-24-20 PSA 16.0 08-14-20: glucose 110; bun 33; creat 1.00 ;k+ 2.5 na++ 140 08-15-20: glucose 114; bun 31; creat  0.84; k+ 3.1; na++ 142 08-21-20: k+ 3.5 10-04-20: albumin 3.2  10-19-20: PSA 12.25 (13.05)  12-21-20: wbc 4.6; hgb 13.4 hct 40.9 mcv 86.1 plt 223; glucose 104; bun 22; creat 0.89; k+ 3.0; na++ 143; ca 9.3 GFR>60; liver normal albumin 3.3; chol 128; ldl 71; trig 85 hdl 40   12-25-20: k+ 3.2 01-01-21: k+ 3.2  TODAY  01-08-21: k+ 4.5 01-19-21: glucose 108; bun 20; creat 0.96; k+ 3.6; na++ 139; ca 9.0 GFR>60  Review of Systems  Constitutional:  Negative for malaise/fatigue.  Respiratory:  Negative for cough and shortness of breath.   Cardiovascular:  Negative for chest pain, palpitations and leg swelling.  Gastrointestinal:  Negative for abdominal pain, constipation and heartburn.  Musculoskeletal:  Negative for back pain, joint pain and myalgias.  Skin: Negative.   Neurological:  Negative for dizziness.  Psychiatric/Behavioral:  The patient is not nervous/anxious.     Physical Exam Constitutional:      General: He is not in acute distress.    Appearance: He is well-developed. He is not  diaphoretic.  Neck:     Thyroid: No thyromegaly.  Cardiovascular:     Rate and Rhythm: Normal rate and regular rhythm.     Pulses: Normal pulses.     Heart sounds: Normal heart sounds.  Pulmonary:     Effort: Pulmonary effort is normal. No respiratory distress.     Breath sounds: Normal breath sounds.  Abdominal:     General: Bowel sounds are normal. There is no distension.     Palpations: Abdomen is soft.     Tenderness: There is no abdominal tenderness.  Musculoskeletal:        General: Normal range of motion.     Cervical back: Neck supple.     Right lower leg: No edema.     Left lower leg: No edema.  Lymphadenopathy:     Cervical: No cervical adenopathy.  Skin:    General: Skin is warm and dry.  Neurological:     Mental Status: He is alert and oriented to person, place, and time.  Psychiatric:        Mood and Affect: Mood normal.     ASSESSMENT/ PLAN:  TODAY  Benign hypertension with chronic kidney disease stage 3 Aortic atherosclerosis Major depression recurrent depression Interstitial lung disease  Will stop hctz: not effective Will begin spironolactone Will repeat BMP 02-05-21  Will monitor his status  Time spent with patient: 40 minutes: medications; blood pressure; therapy needs.  Ok Edwards NP [facility]  Contact [phone] Monday through Friday 8am- 5pm  After hours call [phone]

## 2021-02-02 ENCOUNTER — Non-Acute Institutional Stay (SKILLED_NURSING_FACILITY): Payer: Medicare Other | Admitting: Adult Health

## 2021-02-02 ENCOUNTER — Encounter: Payer: Self-pay | Admitting: Adult Health

## 2021-02-02 DIAGNOSIS — M5136 Other intervertebral disc degeneration, lumbar region: Secondary | ICD-10-CM | POA: Diagnosis not present

## 2021-02-02 DIAGNOSIS — N183 Chronic kidney disease, stage 3 unspecified: Secondary | ICD-10-CM | POA: Diagnosis not present

## 2021-02-02 DIAGNOSIS — K529 Noninfective gastroenteritis and colitis, unspecified: Secondary | ICD-10-CM | POA: Diagnosis not present

## 2021-02-02 DIAGNOSIS — M6281 Muscle weakness (generalized): Secondary | ICD-10-CM | POA: Diagnosis not present

## 2021-02-02 DIAGNOSIS — Z9181 History of falling: Secondary | ICD-10-CM | POA: Diagnosis not present

## 2021-02-02 DIAGNOSIS — M545 Low back pain, unspecified: Secondary | ICD-10-CM | POA: Diagnosis not present

## 2021-02-02 DIAGNOSIS — I129 Hypertensive chronic kidney disease with stage 1 through stage 4 chronic kidney disease, or unspecified chronic kidney disease: Secondary | ICD-10-CM | POA: Diagnosis not present

## 2021-02-02 DIAGNOSIS — R262 Difficulty in walking, not elsewhere classified: Secondary | ICD-10-CM | POA: Diagnosis not present

## 2021-02-02 NOTE — Progress Notes (Signed)
Location:  [place] Room Number: 113-D Place of Service:  SNF (31)   CODE STATUS: DNR  Allergies  Allergen Reactions   Hctz [Hydrochlorothiazide]     hypokalemia    Chief Complaint  Patient presents with   Acute Visit    Hypertension    HPI:  His blood pressure readings have been elevated. He did require clonidine 0.1 mg this AM due to reading of 190/110. He is taking myrbetriq 25 mg daily for urinary frequency. Hypertension is a side effect of this medication. We had lowered the myrbetriq from 50 mg in the recent months. We have discussed his medications. He does realize that the medication will need to be stopped. He had been on detrol LA and fell with a hip fracture. He is concerned that his PSA being elevated; but does not want any further treatments done such as radiation or chemotherapy. We have agreed not to check this lab work.   Past Medical History:  Diagnosis Date   Anxiety    Cancer [facility])    prostate   Chronic chest wall pain    Essential hypertension, benign 06/20/2016   GAD (generalized anxiety disorder)    GERD (gastroesophageal reflux disease)    H/O echocardiogram 2016   normal   Heart murmur    "leaking heart valve"   High cholesterol 06/20/2016   HOH (hard of hearing)    left   Hx of falling    Interstitial pulmonary disease (HCC)    MDD (major depressive disorder)    Murmur, cardiac    Poor historian    PUD (peptic ulcer disease)    RLS (restless legs syndrome)    Shortness of breath    Wrist fracture 12/16/2011    Past Surgical History:  Procedure Laterality Date   CHOLECYSTECTOMY N/A 06/28/2020   Procedure: LAPAROSCOPIC CHOLECYSTECTOMY;  Surgeon: [name], MD;  Location: [facility] ORS;  Service: General;  Laterality: N/A;  pt in [place]  2005   Rehman: Sigmoid colon diverticulosis, moderate sized external hemorrhoids. A few focal areas of mucosal erythema felt to be nonspecific.    ESOPHAGOGASTRODUODENOSCOPY     ESOPHAGOGASTRODUODENOSCOPY N/A 07/18/2016   Rehman: normal exam except duodenal scar. h.pylori serologies negative   FLEXIBLE SIGMOIDOSCOPY N/A 06/11/2020   Procedure: FLEXIBLE SIGMOIDOSCOPY;  Surgeon: Eloise Harman, DO;  Location: [facility] END[facility] SUITE;  Service: Endoscopy;  Laterality: N/A;   HIP PINNING,CANNULATED Right 03/10/2020   Procedure: INTERNAL FIXATION RIGHT HIP;  Surgeon: Carole Civil, MD;  Location: [facility] ORS;  Service: Orthopedics;  Laterality: Right;   ORIF WRIST FRACTURE  12/19/2011   Procedure: OPEN REDUCTION INTERNAL FIXATION (ORIF) WRIST FRACTURE;  Surgeon: Carole Civil, MD;  Location: [facility];  Service: Orthopedics;  Laterality: Right;   prostate cancer     Diagnosed in the 90s   TONSILLECTOMY      Social History   Socioeconomic History   Marital status: Widowed    Spouse name: Not on file   Number of children: Not on file   Years of education: Not on file   Highest education level: Not on file  Occupational History   Occupation: retired    Fish farm manager: RETIRED  Tobacco Use   Smoking status: Never   Smokeless tobacco: Never  Vaping Use   Vaping Use: Never used  Substance and Sexual Activity   Alcohol use: No   Drug use: No   Sexual activity: Not Currently  Other Topics  Concern   Not on file  Social History Narrative   Is long term resident of [facility]    Social Determinants of Health   Financial Resource Strain: Not on file  Food Insecurity: Not on file  Transportation Needs: Not on file  Physical Activity: Not on file  Stress: Not on file  Social Connections: Not on file  Intimate Partner Violence: Not on file   Family History  Problem Relation Age of Onset   Cancer Other    Heart attack Mother    Cancer Brother    Heart attack Brother    Heart disease Sister        by pass      VITAL SIGNS BP (!) 190/110   Pulse 90   Temp (!) 97.4 F (36.3 C)   Resp 16   Ht '5\' 9"'$  (1.753 m)   Wt 175 lb 6.4 oz (79.6 kg)    SpO2 98%   BMI 25.90 kg/m   Outpatient Encounter Medications as of 02/02/2021  Medication Sig   acetaminophen (TYLENOL) 500 MG tablet Take 500 mg by mouth every 6 (six) hours.   alfuzosin (UROXATRAL) 10 MG 24 hr tablet Take 10 mg by mouth daily with breakfast.   Ascorbic Acid (VITAMIN C) 1000 MG tablet Take 1,000 mg by mouth daily.   aspirin 81 MG chewable tablet Chew 1 tablet (81 mg total) by mouth daily with breakfast.   Balsam [place]-Castor Oil (VENELEX) OINT Special Instructions: Apply to bilateral buttocks & sacral area qshift for blanchable erythema.   calcium carbonate (TUMS - DOSED IN MG ELEMENTAL CALCIUM) 500 MG chewable tablet Chew 1 tablet by mouth 2 (two) times daily as needed for indigestion or heartburn.    diltiazem (CARDIZEM CD) 360 MG 24 hr capsule Take 1 capsule (360 mg total) by mouth daily.   ferrous sulfate 325 (65 FE) MG tablet Take 325 mg by mouth daily with breakfast. For Anemia and  Iron Deficiency   Flax Oil-Fish Oil-Borage Oil (FISH OIL-FLAX OIL-BORAGE OIL) CAPS Take 1 capsule by mouth daily.   ipratropium-albuterol (DUONEB) 0.5-2.5 (3) MG/3ML SOLN Take 3 mLs by nebulization every 6 (six) hours as needed.   isosorbide mononitrate (IMDUR) 30 MG 24 hr tablet Take 1 tablet (30 mg total) by mouth daily.   lisinopril (ZESTRIL) 40 MG tablet Take 1 tablet (40 mg total) by mouth daily.   loperamide (IMODIUM A-D) 2 MG tablet amt: '2mg'$ ; oral Special Instructions: after each stool do not exceed more than 4tabs in 24 hours, As Needed   Melatonin 10 MG TABS Take 10 mg by mouth at bedtime.   meloxicam (MOBIC) 7.5 MG tablet Take 7.5 mg by mouth daily. give for left hip pain   memantine (NAMENDA) 10 MG tablet Take 10 mg by mouth 2 (two) times daily.   methocarbamol (ROBAXIN) 500 MG tablet Take 500 mg by mouth daily in the afternoon.   metoprolol tartrate (LOPRESSOR) 25 MG tablet Take 1 tablet (25 mg total) by mouth 2 (two) times daily.   NON FORMULARY Diet: Regular   omeprazole  (PRILOSEC) 20 MG capsule Take 1 capsule (20 mg total) by mouth daily before breakfast.   ondansetron (ZOFRAN-ODT) 8 MG disintegrating tablet Take 8 mg by mouth 3 (three) times daily before meals. As needed for vomiting and nausea   OXYGEN Inhale 2 L into the lungs continuous.   polyethylene glycol (MIRALAX / GLYCOLAX) 17 g packet Take 17 g by mouth daily as needed.   potassium chloride  SA (KLOR-CON) 20 MEQ tablet Take 40 mEq by mouth 3 (three) times daily.   rOPINIRole (REQUIP) 0.25 MG tablet Take 0.25 mg by mouth at bedtime.   senna-docusate (SENOKOT-S) 8.6-50 MG tablet Take 1 tablet by mouth at bedtime as needed for mild constipation.   simethicone (MYLICON) 0000000 MG chewable tablet Chew 125 mg by mouth in the morning, at noon, in the evening, and at bedtime.   spironolactone (ALDACTONE) 25 MG tablet Take 25 mg by mouth daily.   venlafaxine XR (EFFEXOR-XR) 75 MG 24 hr capsule Take 75 mg by mouth daily with breakfast.   [DISCONTINUED] hydrALAZINE (APRESOLINE) 50 MG tablet Take 1 tablet (50 mg total) by mouth 3 (three) times daily.   [DISCONTINUED] mirabegron ER (MYRBETRIQ) 50 MG TB24 tablet Take 1 tablet (50 mg total) by mouth daily.   No facility-administered encounter medications on file as of 02/02/2021.     SIGNIFICANT DIAGNOSTIC EXAMS   PREVIOUS   06-09-20: ct of abdomen and pelvis:  Cholelithiasis. Haziness noted around the gallbladder. Cannot exclude acute cholecystitis. Continued wall thickening in the left colon from the distal transverse colon through the distal descending colon concerning for colitis. Left colonic diverticulosis. Small bilateral pleural effusions, increasing since prior study. Bibasilar atelectasis. Coronary artery disease, aortic atherosclerosis.  06-09-20: abdominal ultrasound:  1. Moderate gallbladder sludge with probable punctate stones. Slight increased wall thickness with small amount of fluid adjacent to the gallbladder but negative sonographic Murphy.  Findings are suggestive of but not definitive for cholecystitis; consider correlation with nuclear medicine hepatobiliary imaging. 6 mm gallbladder polyp. 2. Simple appearing renal cysts. 3. Incidental note made of pleural effusions.  06-12-20: HIDA:  1. No scintigraphic evidence of acute cholecystitis. 2. Findings compatible with severe biliary dyskinesia with little to no gallbladder emptying and elicitation of abdominal pain with the CCK administration.  06-13-20: chest x-ray:  Diffuse interstitial opacity with Awanda Mink lines with possible trace pleural fluid. Stable heart size and aortic contours. Negative for air leak  NO NEW EXAMS.    LABS REVIEWED PREVIOUS    03-09-20: wbc 13.7; hgb 12.1; hct 37.7; mcv 80.4 plt 205; glucose 208; bun 21; creat 1.50; k+ 3.2; na++ 141; ca 9.2; hgb a1c 5.9; HIV: nr 03-10-20: wbc 11.2; hgb 11.3; hct 35.9; mcv 82.7 plt 188; glucose 160; bun 18; creat 1.21; k+ 3.3; na++ 139; ca 8.8 liver normal 3.0; mag 2.3 03-12-20: wbc 10.4; hgb 9.3; hct 29.6; mcv 84.3 plt 178; glucose 138; bun 34; creat 1.77; k+ 3.8; na++ 138; ca 8.3 03-14-20: glucose 134; bun 36; creat 1.23; k+ 3.2; na++ 139; ca 8.8 mag 2.1   03-16-20: hgb 10.7; hct 33.4 glucose 127; bun 18; creat 0.86 k+ 3.3; na++ 139; ca 9.2 ;liver normal albumin 2.5 iron 23; tibc 221; ferritin 137 03-23-20: k+ 3.0  03-27-20: glucose 133; bun 34; creat 0.85; k+ 3.6; na++ 142; ca 9.0; AM cortisol: 10.6  05-22-20: wbc 19.4; hgb 14.6; hct 43.8; mcv 81.6 plt 254; glucose 211; bun 35; creat 1.20; k+ 3.0; na++ 138; ca 9.8; liver normal albumin 3.5 mag 1.9; urine culture: <10,000; blood culture: no growth 05-25-20: wbc 14.2; hgb 20.8; hct 34.8; mcv 84.7 plt 212; glucose 109; bun 24; creat 0.86; k+ 3.4; na++ 137; ca 8.9; mag 1.9 05-28-20: wbc 10.9; hgb 11.8; hct 36.8; mcv 81.8 plt 227; glucose 132; bun 10; creat 0.82; k+ 3.0; na++ 137; ca 8.7 06-01-20: wbc 12.2; hgb 12.5; hct 40.3; mcv 81.9 plt 136; glucose 136; bun 12; creat 0.81; k+  3.0; na++ 140; ca 8.9 mag 1.9 06-05-20: wbc 13.6; hgb 10.2; hct 32.9; mcv 93.5 plt 266 glucose 125; bun 35; creat 1.26; k+ 3.5; na++ 137; ca 8.9  06-07-20: wbc 11.9; hgb 9.6; hct 29.8; mcv 82.8 plt 318; glucose 114; bun 42; creat 1.47; k+ 3.9; na++ 138; ca 8.8 GFR 46; liver normal albumin 2.1  06-08-20: glucose 107; bun 57; creat 1.81; k+ 4.3; na++ 135; ca 8.9 GFR 36 06-09-20: glucose 107; bun 52; creat 1.64; k+ 5.3; na++ 136; ca 9.3;  GFR 40; liver normal albumin 2.3 06-10-20: wbc 12.6; hgb 10.5; hct 34.2; mcv 83.4 plt 417; glucose 126; bun 39; creat 4.4; na++ 137; ca 9.5 GFR 58; liver normal albumin 2.2 06-12-20: wbc 8.2; hgb 10.4; hct 33.6; mcv 81.2 plt 364; glucose 123; bun 24; creat 0.87; k+ 3.5; na++ 138; GFR >60; liver normal albumin 1.9 06-13-20: urine culture: <10,000 colonies 06-14-20: wbc 9.0; hgb 10.5; hct 33.8; mcv 80.5 plt 267; glucose 122; bun 20; creat 0.97; k+3.3; na++ 140; ca 9.0 GFR >60 06-29-20: wbc 10.1; hgb 10.7; hct 36.1; mcv 83.0 plt 294; glucose 152; bun 26; creat 1.02; k+ 4.4; na++ 138 ca 8.8 GFR>60 07-24-20 PSA 16.0 08-14-20: glucose 110; bun 33; creat 1.00 ;k+ 2.5 na++ 140 08-15-20: glucose 114; bun 31; creat  0.84; k+ 3.1; na++ 142 08-21-20: k+ 3.5 10-04-20: albumin 3.2  10-19-20: PSA 12.25 (13.05)  12-21-20: wbc 4.6; hgb 13.4 hct 40.9 mcv 86.1 plt 223; glucose 104; bun 22; creat 0.89; k+ 3.0; na++ 143; ca 9.3 GFR>60; liver normal albumin 3.3; chol 128; ldl 71; trig 85 hdl 40   12-25-20: k+ 3.2 01-01-21: k+ 3.2 01-08-21: k+ 4.5 01-19-21: glucose 108; bun 20; creat 0.96; k+ 3.6; na++ 139; ca 9.0 GFR>60  NO NEW LABS.   Review of Systems  Constitutional:  Negative for malaise/fatigue.  Respiratory:  Negative for cough and shortness of breath.   Cardiovascular:  Negative for chest pain, palpitations and leg swelling.  Gastrointestinal:  Negative for abdominal pain, constipation and heartburn.  Genitourinary:  Positive for frequency and urgency.  Musculoskeletal:  Negative for back  pain, joint pain and myalgias.  Skin: Negative.   Neurological:  Negative for dizziness.  Psychiatric/Behavioral:  The patient is not nervous/anxious.    Physical Exam Constitutional:      General: He is not in acute distress.    Appearance: He is well-developed. He is not diaphoretic.  Neck:     Thyroid: No thyromegaly.  Cardiovascular:     Rate and Rhythm: Normal rate and regular rhythm.     Pulses: Normal pulses.     Heart sounds: Normal heart sounds.  Pulmonary:     Effort: Pulmonary effort is normal. No respiratory distress.     Breath sounds: Normal breath sounds.  Abdominal:     General: Bowel sounds are normal. There is no distension.     Palpations: Abdomen is soft.     Tenderness: There is no abdominal tenderness.  Musculoskeletal:        General: Normal range of motion.     Cervical back: Neck supple.     Right lower leg: No edema.     Left lower leg: No edema.  Lymphadenopathy:     Cervical: No cervical adenopathy.  Skin:    General: Skin is warm and dry.  Neurological:     Mental Status: He is alert. Mental status is at baseline.  Psychiatric:        Mood and Affect:  Mood normal.     ASSESSMENT/ PLAN:  TODAY  Benign hypertension with chronic kidney disease stage III:   He responded to clonidine Will stop myrbetriq and will monitor his status.    Ok Edwards NP [facility]  Contact [phone] Monday through Friday 8am- 5pm  After hours call [phone]

## 2021-02-05 ENCOUNTER — Telehealth: Payer: Self-pay | Admitting: Orthopedic Surgery

## 2021-02-05 ENCOUNTER — Encounter (HOSPITAL_COMMUNITY)
Admission: AD | Admit: 2021-02-05 | Discharge: 2021-02-05 | Disposition: A | Discharge status: Home / Self Care | Payer: Medicare Other | Source: Skilled Nursing Facility | Attending: Adult Health | Admitting: Adult Health

## 2021-02-05 DIAGNOSIS — E876 Hypokalemia: Secondary | ICD-10-CM | POA: Diagnosis not present

## 2021-02-05 LAB — BASIC METABOLIC PANEL
Anion gap: 6 (ref 5–15)
BUN: 25 mg/dL — ABNORMAL HIGH (ref 8–23)
CO2: 31 mmol/L (ref 22–32)
Calcium: 9.5 mg/dL (ref 8.9–10.3)
Chloride: 104 mmol/L (ref 98–111)
Creatinine, Ser: 1.05 mg/dL (ref 0.61–1.24)
GFR, Estimated: >60 mL/min (ref >60)
Glucose, Bld: 109 mg/dL — ABNORMAL HIGH (ref 70–99)
Potassium: 4.5 mmol/L (ref 3.5–5.1)
Sodium: 141 mmol/L (ref 135–145)

## 2021-02-05 NOTE — Telephone Encounter (Signed)
[facility] called our office, initially [place], then [place], [phone] regarding scheduling patient's follow up. Per last office appointment 01/10/21, after visit summary shows follow up as needed. [place] relays patient's daughter-in-law Silva Bandy, designated contact on file, said patient is to return in September, then again in October. Also, patient has questions about dental work he may be having - states has appointment scheduled with oral surgeon 02/19/21, at [facility]. Said he also has another problem with a tooth on the bottom.  Please advise facility at ph# noted [phone]), and they will relay response to daughter-in-law.

## 2021-02-05 NOTE — Telephone Encounter (Signed)
Ok to make appointment if they are requesting one September or October, or when they want, no need to work in unless something urgent is going on.   He is not a total does not need antibiotics for the hip screw placement in hip  I called to advise.  Left message for [facility] to advise not a total replacement does not need antibiotics I think that's the question for me   To Arbie Cookey for appointment  To Dr Bill Salinas

## 2021-02-07 NOTE — Telephone Encounter (Signed)
Called [facility]; scheduled appointment per Mavis, and also called patient's family; spoke with Silva Bandy; aware of appointment as well.

## 2021-02-08 DIAGNOSIS — F4323 Adjustment disorder with mixed anxiety and depressed mood: Secondary | ICD-10-CM | POA: Diagnosis not present

## 2021-02-13 ENCOUNTER — Non-Acute Institutional Stay (SKILLED_NURSING_FACILITY): Payer: Medicare Other | Admitting: Adult Health

## 2021-02-13 ENCOUNTER — Encounter: Payer: Self-pay | Admitting: Adult Health

## 2021-02-13 DIAGNOSIS — F0390 Unspecified dementia without behavioral disturbance: Secondary | ICD-10-CM | POA: Diagnosis not present

## 2021-02-13 DIAGNOSIS — D508 Other iron deficiency anemias: Secondary | ICD-10-CM

## 2021-02-13 DIAGNOSIS — M25552 Pain in left hip: Secondary | ICD-10-CM

## 2021-02-13 DIAGNOSIS — J849 Interstitial pulmonary disease, unspecified: Secondary | ICD-10-CM

## 2021-02-13 DIAGNOSIS — G8929 Other chronic pain: Secondary | ICD-10-CM | POA: Diagnosis not present

## 2021-02-13 NOTE — Progress Notes (Signed)
Location:  [place] Room Number: 113-D Place of Service:  SNF (31)   CODE STATUS: DNR  Allergies  Allergen Reactions   Hctz [Hydrochlorothiazide]     hypokalemia    Chief Complaint  Patient presents with   Medical Management of Chronic Issues            Iron deficiency anemia due to dietary intake:  Interstitial lung disease: Left hip pain:  Dementia without behavioral disturbance unspecified dementia type    HPI:  He is a 87 year old long term resident of this facility being seen for the management of his chronic illnesses: Iron deficiency anemia due to dietary intake:  Interstitial lung disease: Left hip pain:  Dementia without behavioral disturbance unspecified dementia type. There are no reports of uncontrolled pain; no changes in appetite; no anxiety or depressive thoughts.   Past Medical History:  Diagnosis Date   Anxiety    Cancer [facility])    prostate   Chronic chest wall pain    Essential hypertension, benign 06/20/2016   GAD (generalized anxiety disorder)    GERD (gastroesophageal reflux disease)    H/O echocardiogram 2016   normal   Heart murmur    "leaking heart valve"   High cholesterol 06/20/2016   HOH (hard of hearing)    left   Hx of falling    Interstitial pulmonary disease (HCC)    MDD (major depressive disorder)    Murmur, cardiac    Poor historian    PUD (peptic ulcer disease)    RLS (restless legs syndrome)    Shortness of breath    Wrist fracture 12/16/2011    Past Surgical History:  Procedure Laterality Date   CHOLECYSTECTOMY N/A 06/28/2020   Procedure: LAPAROSCOPIC CHOLECYSTECTOMY;  Surgeon: [name], MD;  Location: [facility] ORS;  Service: General;  Laterality: N/A;  pt in [place]  2005   Rehman: Sigmoid colon diverticulosis, moderate sized external hemorrhoids. A few focal areas of mucosal erythema felt to be nonspecific.   ESOPHAGOGASTRODUODENOSCOPY     ESOPHAGOGASTRODUODENOSCOPY N/A 07/18/2016    Rehman: normal exam except duodenal scar. h.pylori serologies negative   FLEXIBLE SIGMOIDOSCOPY N/A 06/11/2020   Procedure: FLEXIBLE SIGMOIDOSCOPY;  Surgeon: Eloise Harman, DO;  Location: [facility] ENDO SUITE;  Service: Endoscopy;  Laterality: N/A;   HIP PINNING,CANNULATED Right 03/10/2020   Procedure: INTERNAL FIXATION RIGHT HIP;  Surgeon: Carole Civil, MD;  Location: [facility] ORS;  Service: Orthopedics;  Laterality: Right;   ORIF WRIST FRACTURE  12/19/2011   Procedure: OPEN REDUCTION INTERNAL FIXATION (ORIF) WRIST FRACTURE;  Surgeon: Carole Civil, MD;  Location: [facility] ORS;  Service: Orthopedics;  Laterality: Right;   prostate cancer     Diagnosed in the 90s   TONSILLECTOMY      Social History   Socioeconomic History   Marital status: Widowed    Spouse name: Not on file   Number of children: Not on file   Years of education: Not on file   Highest education level: Not on file  Occupational History   Occupation: retired    Fish farm manager: RETIRED  Tobacco Use   Smoking status: Never   Smokeless tobacco: Never  Vaping Use   Vaping Use: Never used  Substance and Sexual Activity   Alcohol use: No   Drug use: No   Sexual activity: Not Currently  Other Topics Concern   Not on file  Social History Narrative   Is long term resident of  [facility]    Social Determinants of Health   Financial Resource Strain: Not on file  Food Insecurity: Not on file  Transportation Needs: Not on file  Physical Activity: Not on file  Stress: Not on file  Social Connections: Not on file  Intimate Partner Violence: Not on file   Family History  Problem Relation Age of Onset   Cancer Other    Heart attack Mother    Cancer Brother    Heart attack Brother    Heart disease Sister        by pass      VITAL SIGNS BP (!) 145/77   Pulse 69   Temp 98.9 F (37.2 C)   Resp 16   Ht '5\' 9"'$  (1.753 m)   Wt 175 lb (79.4 kg)   SpO2 98%   BMI 25.84 kg/m   Outpatient Encounter Medications as of 02/13/2021   Medication Sig   acetaminophen (TYLENOL) 500 MG tablet Take 500 mg by mouth every 6 (six) hours.   alfuzosin (UROXATRAL) 10 MG 24 hr tablet Take 10 mg by mouth daily with breakfast.   Ascorbic Acid (VITAMIN C) 1000 MG tablet Take 1,000 mg by mouth daily.   aspirin 81 MG chewable tablet Chew 1 tablet (81 mg total) by mouth daily with breakfast.   Balsam [place]-Castor Oil (VENELEX) OINT Special Instructions: Apply to bilateral buttocks & sacral area qshift for blanchable erythema.   calcium carbonate (TUMS - DOSED IN MG ELEMENTAL CALCIUM) 500 MG chewable tablet Chew 1 tablet by mouth 2 (two) times daily as needed for indigestion or heartburn.    diltiazem (CARDIZEM CD) 360 MG 24 hr capsule Take 1 capsule (360 mg total) by mouth daily.   ferrous sulfate 325 (65 FE) MG tablet Take 325 mg by mouth daily with breakfast. For Anemia and  Iron Deficiency   Flax Oil-Fish Oil-Borage Oil (FISH OIL-FLAX OIL-BORAGE OIL) CAPS Take 1 capsule by mouth daily.   hydrochlorothiazide (MICROZIDE) 12.5 MG capsule Take 12.5 mg by mouth daily.   ipratropium-albuterol (DUONEB) 0.5-2.5 (3) MG/3ML SOLN Take 3 mLs by nebulization every 6 (six) hours as needed.   isosorbide mononitrate (IMDUR) 30 MG 24 hr tablet Take 1 tablet (30 mg total) by mouth daily.   lisinopril (ZESTRIL) 40 MG tablet Take 1 tablet (40 mg total) by mouth daily.   loperamide (IMODIUM A-D) 2 MG tablet amt: '2mg'$ ; oral Special Instructions: after each stool do not exceed more than 4tabs in 24 hours, As Needed   Melatonin 10 MG TABS Take 10 mg by mouth at bedtime.   meloxicam (MOBIC) 7.5 MG tablet Take 7.5 mg by mouth daily. give for left hip pain   memantine (NAMENDA) 10 MG tablet Take 10 mg by mouth 2 (two) times daily.   methocarbamol (ROBAXIN) 500 MG tablet Take 500 mg by mouth daily in the afternoon.   metoprolol tartrate (LOPRESSOR) 25 MG tablet Take 1 tablet (25 mg total) by mouth 2 (two) times daily.   NON FORMULARY Diet: Regular   omeprazole  (PRILOSEC) 20 MG capsule Take 1 capsule (20 mg total) by mouth daily before breakfast.   ondansetron (ZOFRAN-ODT) 8 MG disintegrating tablet Take 8 mg by mouth 3 (three) times daily before meals. As needed for vomiting and nausea   OXYGEN Inhale 2 L into the lungs continuous.   polyethylene glycol (MIRALAX / GLYCOLAX) 17 g packet Take 17 g by mouth daily.   potassium chloride SA (KLOR-CON) 20 MEQ tablet Take 40 mEq by  mouth 3 (three) times daily.   rOPINIRole (REQUIP) 0.25 MG tablet Take 0.25 mg by mouth at bedtime.   senna-docusate (SENOKOT-S) 8.6-50 MG tablet Take 1 tablet by mouth at bedtime as needed for mild constipation.   simethicone (MYLICON) 0000000 MG chewable tablet Chew 125 mg by mouth in the morning, at noon, in the evening, and at bedtime.   spironolactone (ALDACTONE) 25 MG tablet Take 25 mg by mouth daily.   venlafaxine XR (EFFEXOR-XR) 150 MG 24 hr capsule Take 150 mg by mouth daily with breakfast.   [DISCONTINUED] venlafaxine XR (EFFEXOR-XR) 75 MG 24 hr capsule Take 75 mg by mouth daily with breakfast.   No facility-administered encounter medications on file as of 02/13/2021.     SIGNIFICANT DIAGNOSTIC EXAMS  PREVIOUS   06-09-20: ct of abdomen and pelvis:  Cholelithiasis. Haziness noted around the gallbladder. Cannot exclude acute cholecystitis. Continued wall thickening in the left colon from the distal transverse colon through the distal descending colon concerning for colitis. Left colonic diverticulosis. Small bilateral pleural effusions, increasing since prior study. Bibasilar atelectasis. Coronary artery disease, aortic atherosclerosis.  06-09-20: abdominal ultrasound:  1. Moderate gallbladder sludge with probable punctate stones. Slight increased wall thickness with small amount of fluid adjacent to the gallbladder but negative sonographic Murphy. Findings are suggestive of but not definitive for cholecystitis; consider correlation with nuclear medicine hepatobiliary  imaging. 6 mm gallbladder polyp. 2. Simple appearing renal cysts. 3. Incidental note made of pleural effusions.  06-12-20: HIDA:  1. No scintigraphic evidence of acute cholecystitis. 2. Findings compatible with severe biliary dyskinesia with little to no gallbladder emptying and elicitation of abdominal pain with the CCK administration.  06-13-20: chest x-ray:  Diffuse interstitial opacity with Awanda Mink lines with possible trace pleural fluid. Stable heart size and aortic contours. Negative for air leak  NO NEW EXAMS.    LABS REVIEWED PREVIOUS    03-09-20: wbc 13.7; hgb 12.1; hct 37.7; mcv 80.4 plt 205; glucose 208; bun 21; creat 1.50; k+ 3.2; na++ 141; ca 9.2; hgb a1c 5.9; HIV: nr 03-10-20: wbc 11.2; hgb 11.3; hct 35.9; mcv 82.7 plt 188; glucose 160; bun 18; creat 1.21; k+ 3.3; na++ 139; ca 8.8 liver normal 3.0; mag 2.3 03-12-20: wbc 10.4; hgb 9.3; hct 29.6; mcv 84.3 plt 178; glucose 138; bun 34; creat 1.77; k+ 3.8; na++ 138; ca 8.3 03-14-20: glucose 134; bun 36; creat 1.23; k+ 3.2; na++ 139; ca 8.8 mag 2.1   03-16-20: hgb 10.7; hct 33.4 glucose 127; bun 18; creat 0.86 k+ 3.3; na++ 139; ca 9.2 ;liver normal albumin 2.5 iron 23; tibc 221; ferritin 137 03-23-20: k+ 3.0  03-27-20: glucose 133; bun 34; creat 0.85; k+ 3.6; na++ 142; ca 9.0; AM cortisol: 10.6  05-22-20: wbc 19.4; hgb 14.6; hct 43.8; mcv 81.6 plt 254; glucose 211; bun 35; creat 1.20; k+ 3.0; na++ 138; ca 9.8; liver normal albumin 3.5 mag 1.9; urine culture: <10,000; blood culture: no growth 05-25-20: wbc 14.2; hgb 20.8; hct 34.8; mcv 84.7 plt 212; glucose 109; bun 24; creat 0.86; k+ 3.4; na++ 137; ca 8.9; mag 1.9 05-28-20: wbc 10.9; hgb 11.8; hct 36.8; mcv 81.8 plt 227; glucose 132; bun 10; creat 0.82; k+ 3.0; na++ 137; ca 8.7 06-01-20: wbc 12.2; hgb 12.5; hct 40.3; mcv 81.9 plt 136; glucose 136; bun 12; creat 0.81; k+ 3.0; na++ 140; ca 8.9 mag 1.9 06-05-20: wbc 13.6; hgb 10.2; hct 32.9; mcv 93.5 plt 266 glucose 125; bun 35; creat 1.26; k+  3.5; na++ 137; ca  8.9  06-07-20: wbc 11.9; hgb 9.6; hct 29.8; mcv 82.8 plt 318; glucose 114; bun 42; creat 1.47; k+ 3.9; na++ 138; ca 8.8 GFR 46; liver normal albumin 2.1  06-08-20: glucose 107; bun 57; creat 1.81; k+ 4.3; na++ 135; ca 8.9 GFR 36 06-09-20: glucose 107; bun 52; creat 1.64; k+ 5.3; na++ 136; ca 9.3;  GFR 40; liver normal albumin 2.3 06-10-20: wbc 12.6; hgb 10.5; hct 34.2; mcv 83.4 plt 417; glucose 126; bun 39; creat 4.4; na++ 137; ca 9.5 GFR 58; liver normal albumin 2.2 06-12-20: wbc 8.2; hgb 10.4; hct 33.6; mcv 81.2 plt 364; glucose 123; bun 24; creat 0.87; k+ 3.5; na++ 138; GFR >60; liver normal albumin 1.9 06-13-20: urine culture: <10,000 colonies 06-14-20: wbc 9.0; hgb 10.5; hct 33.8; mcv 80.5 plt 267; glucose 122; bun 20; creat 0.97; k+3.3; na++ 140; ca 9.0 GFR >60 06-29-20: wbc 10.1; hgb 10.7; hct 36.1; mcv 83.0 plt 294; glucose 152; bun 26; creat 1.02; k+ 4.4; na++ 138 ca 8.8 GFR>60 07-24-20 PSA 16.0 08-14-20: glucose 110; bun 33; creat 1.00 ;k+ 2.5 na++ 140 08-15-20: glucose 114; bun 31; creat  0.84; k+ 3.1; na++ 142 08-21-20: k+ 3.5 10-04-20: albumin 3.2  10-19-20: PSA 12.25 (13.05)  12-21-20: wbc 4.6; hgb 13.4 hct 40.9 mcv 86.1 plt 223; glucose 104; bun 22; creat 0.89; k+ 3.0; na++ 143; ca 9.3 GFR>60; liver normal albumin 3.3; chol 128; ldl 71; trig 85 hdl 40   12-25-20: k+ 3.2 01-01-21: k+ 3.2 01-08-21: k+ 4.5 01-19-21: glucose 108; bun 20; creat 0.96; k+ 3.6; na++ 139; ca 9.0 GFR>60  TODAY  02-05-21: glucose 109; bun 25; creat 1.05; k+ 4.5; na++ 141; ca 9.5; GFR>60.   Review of Systems  Constitutional:  Negative for malaise/fatigue.  Respiratory:  Negative for cough and shortness of breath.   Cardiovascular:  Negative for chest pain, palpitations and leg swelling.  Gastrointestinal:  Negative for abdominal pain, constipation and heartburn.  Musculoskeletal:  Negative for back pain, joint pain and myalgias.  Skin: Negative.   Neurological:  Negative for dizziness.   Psychiatric/Behavioral:  The patient is not nervous/anxious.    Physical Exam Constitutional:      General: He is not in acute distress.    Appearance: He is well-developed. He is not diaphoretic.  Neck:     Thyroid: No thyromegaly.  Cardiovascular:     Rate and Rhythm: Normal rate and regular rhythm.     Pulses: Normal pulses.     Heart sounds: Normal heart sounds.  Pulmonary:     Effort: Pulmonary effort is normal. No respiratory distress.     Breath sounds: Normal breath sounds.  Abdominal:     General: Bowel sounds are normal. There is no distension.     Palpations: Abdomen is soft.     Tenderness: There is no abdominal tenderness.  Musculoskeletal:        General: Normal range of motion.     Cervical back: Neck supple.  Lymphadenopathy:     Cervical: No cervical adenopathy.  Skin:    General: Skin is warm and dry.  Neurological:     Mental Status: He is alert. Mental status is at baseline.  Psychiatric:        Mood and Affect: Mood normal.     ASSESSMENT/ PLAN:  TODAY  Iron deficiency anemia due to dietary intake: is stable hgb 10.5 will continue iron daily  2. Interstitial lung disease: is stable has prn 02  3. Left hip pain: is  stable will continue voltaren gel 4 gm twice daily   4. Dementia without behavioral disturbance unspecified dementia type: is stable weight is 175 pounds; will continue namenda 10 mg twice daily    PREVIOUS   5 Aortic atherosclerosis (ct 06-09-20) will continue asa 81 mg daily  6. Protein calorie malnutrition severe: has improved albumin 3.2 will continue prostat  30 ML daily  7. Ischemic colitis/biliary dyskinesia/ calculus of gall bladder without cholecystitis without obstruction: is stable is status post cholecystectomy: 06-28-20. Will continue gas X 125 mg four times daily is on regular diet.  8. Essential hypertension: is stable b/p 145/77 will continue lopressor 25 mg twice daily cardizem cd 360 mg daily lisinopril  hct  40/12.5 mg daily   9. Gastroesophageal reflux disease without esophagitis: is stable will continue prilosec 20 mg daily   10. Prostate cancer/overactive bladder: is stable will continue urosatral 10 mg daily no oxybutrin due to history of fracture; is off myrbetriq due to hypertension  is followed by urology;  11. Hypokalemia: is stable k+ 3.5 will continue k+ 20 meq twice daily   12 Dyslipidemia: is stable will continue fish oil daily is off zocor  13. Chronic constipation: is stable will continue miralax daily and senna s as needed  14. Major depression recurrent chronic: is stable will continue effexor xr 150 mg daily and melatonin 10 mg nightly for sleep  15.RLS: is stable will continue requip 0.25 mg nightly    Ok Edwards NP [facility]  Contact [phone] Monday through Friday 8am- 5pm  After hours call [phone]

## 2021-02-16 DIAGNOSIS — Z1152 Encounter for screening for COVID-19: Secondary | ICD-10-CM | POA: Diagnosis not present

## 2021-02-22 ENCOUNTER — Encounter: Payer: Self-pay | Admitting: Orthopedic Surgery

## 2021-02-22 ENCOUNTER — Ambulatory Visit (INDEPENDENT_AMBULATORY_CARE_PROVIDER_SITE_OTHER): Payer: Medicare Other | Admitting: Orthopedic Surgery

## 2021-02-22 VITALS — BP 128/69 | HR 57 | Ht 68.0 in | Wt 175.0 lb

## 2021-02-22 DIAGNOSIS — S72001A Fracture of unspecified part of neck of right femur, initial encounter for closed fracture: Secondary | ICD-10-CM | POA: Diagnosis not present

## 2021-02-22 DIAGNOSIS — F4323 Adjustment disorder with mixed anxiety and depressed mood: Secondary | ICD-10-CM | POA: Diagnosis not present

## 2021-02-22 NOTE — Progress Notes (Signed)
NEW PROBLEM//OFFICE VISIT  Summary assessment and plan:   Encounter Diagnosis  Name Primary?   Closed displaced fracture of right femoral neck ([place]) 03/10/20 cannulated screw fixation  Yes    87 year old male had a percutaneous pinning of his right hip fracture last year he is here today to get the screws taken out as some of them have backed out  Plan for hardware removal right hip  Precautions of possible refracture breakage of the screws infection were given   Chief Complaint  Patient presents with   Hip Pain    Right/ ready to schedule surgery for hip screw removal Will be one year soon surgery 03/10/20   87 year old male status post ORIF right hip October 2021 with 4 screws and washers  He has pain over the greater trochanter and prominent screws which x-rays show a backed out he would like them removed he is here for preop    MEDICAL DECISION MAKING  A.  Encounter Diagnosis  Name Primary?   Closed displaced fracture of right femoral neck ([place]) 03/10/20 cannulated screw fixation  Yes    B. DATA ANALYSED:   IMAGING: Interpretation of images: Internal images from last visit show the screws backing out therefore screws in a box configuration for washers  Orders: Surgery orders placed  Outside records reviewed: None needed   C. MANAGEMENT   Preop for surgery  No orders of the defined types were placed in this encounter.    BP 128/69   Pulse (!) 57   Ht '5\' 8"'$  (1.727 m)   Wt 175 lb (79.4 kg)   BMI 26.61 kg/m    General appearance: Well-developed well-nourished no gross deformities  Cardiovascular normal pulse and perfusion normal color without edema  Neurologically no sensation loss or deficits or pathologic reflexes  Psychological: Awake alert and oriented x3 mood and affect normal  Skin no lacerations or ulcerations no nodularity no palpable masses, no erythema or nodularity  Musculoskeletal: Tenderness over the greater trochanter on the right  screws are prominent no wound infection hip range of motion is normal   ROS No new acute symptoms under review of systems  Past Medical History:  Diagnosis Date   Anxiety    Cancer ([place])    prostate   Chronic chest wall pain    Essential hypertension, benign 06/20/2016   GAD (generalized anxiety disorder)    GERD (gastroesophageal reflux disease)    H/O echocardiogram 2016   normal   Heart murmur    "leaking heart valve"   High cholesterol 06/20/2016   HOH (hard of hearing)    left   Hx of falling    Interstitial pulmonary disease (HCC)    MDD (major depressive disorder)    Murmur, cardiac    Poor historian    PUD (peptic ulcer disease)    RLS (restless legs syndrome)    Shortness of breath    Wrist fracture 12/16/2011    Past Surgical History:  Procedure Laterality Date   CHOLECYSTECTOMY N/A 06/28/2020   Procedure: LAPAROSCOPIC CHOLECYSTECTOMY;  Surgeon: [name], MD;  Location: [facility];  Service: General;  Laterality: N/A;  pt in [place]  2005   Rehman: Sigmoid colon diverticulosis, moderate sized external hemorrhoids. A few focal areas of mucosal erythema felt to be nonspecific.   ESOPHAGOGASTRODUODENOSCOPY     ESOPHAGOGASTRODUODENOSCOPY N/A 07/18/2016   Rehman: normal exam except duodenal scar. h.pylori serologies negative   FLEXIBLE SIGMOIDOSCOPY N/A 06/11/2020   Procedure: FLEXIBLE  SIGMOIDOSCOPY;  Surgeon: Eloise Harman, DO;  Location: [facility];  Service: Endoscopy;  Laterality: N/A;   HIP PINNING,CANNULATED Right 03/10/2020   Procedure: INTERNAL FIXATION RIGHT HIP;  Surgeon: Carole Civil, MD;  Location: [facility];  Service: Orthopedics;  Laterality: Right;   ORIF WRIST FRACTURE  12/19/2011   Procedure: OPEN REDUCTION INTERNAL FIXATION (ORIF) WRIST FRACTURE;  Surgeon: Carole Civil, MD;  Location: [facility];  Service: Orthopedics;  Laterality: Right;   prostate cancer     Diagnosed in the 90s   TONSILLECTOMY      Family  History  Problem Relation Age of Onset   Cancer Other    Heart attack Mother    Cancer Brother    Heart attack Brother    Heart disease Sister        by pass   Social History   Tobacco Use   Smoking status: Never   Smokeless tobacco: Never  Vaping Use   Vaping Use: Never used  Substance Use Topics   Alcohol use: No   Drug use: No    Allergies  Allergen Reactions   Hctz [Hydrochlorothiazide]     hypokalemia    Current Meds  Medication Sig   acetaminophen (TYLENOL) 500 MG tablet Take 500 mg by mouth every 6 (six) hours.   alfuzosin (UROXATRAL) 10 MG 24 hr tablet Take 10 mg by mouth daily with breakfast.   Ascorbic Acid (VITAMIN C) 1000 MG tablet Take 1,000 mg by mouth daily.   aspirin 81 MG chewable tablet Chew 1 tablet (81 mg total) by mouth daily with breakfast.   Balsam [place]-Castor Oil (VENELEX) OINT Special Instructions: Apply to bilateral buttocks & sacral area qshift for blanchable erythema.   calcium carbonate (TUMS - DOSED IN MG ELEMENTAL CALCIUM) 500 MG chewable tablet Chew 1 tablet by mouth 2 (two) times daily as needed for indigestion or heartburn.    diltiazem (CARDIZEM CD) 360 MG 24 hr capsule Take 1 capsule (360 mg total) by mouth daily.   ferrous sulfate 325 (65 FE) MG tablet Take 325 mg by mouth daily with breakfast. For Anemia and  Iron Deficiency   Flax Oil-Fish Oil-Borage Oil (FISH OIL-FLAX OIL-BORAGE OIL) CAPS Take 1 capsule by mouth daily.   hydrochlorothiazide (MICROZIDE) 12.5 MG capsule Take 12.5 mg by mouth daily.   ipratropium-albuterol (DUONEB) 0.5-2.5 (3) MG/3ML SOLN Take 3 mLs by nebulization every 6 (six) hours as needed.   isosorbide mononitrate (IMDUR) 30 MG 24 hr tablet Take 1 tablet (30 mg total) by mouth daily.   lisinopril (ZESTRIL) 40 MG tablet Take 1 tablet (40 mg total) by mouth daily.   loperamide (IMODIUM A-D) 2 MG tablet amt: '2mg'$ ; oral Special Instructions: after each stool do not exceed more than 4tabs in 24 hours, As Needed    Melatonin 10 MG TABS Take 10 mg by mouth at bedtime.   meloxicam (MOBIC) 7.5 MG tablet Take 7.5 mg by mouth daily. give for left hip pain   memantine (NAMENDA) 10 MG tablet Take 10 mg by mouth 2 (two) times daily.   methocarbamol (ROBAXIN) 500 MG tablet Take 500 mg by mouth daily in the afternoon.   metoprolol tartrate (LOPRESSOR) 25 MG tablet Take 1 tablet (25 mg total) by mouth 2 (two) times daily.   NON FORMULARY Diet: Regular   omeprazole (PRILOSEC) 20 MG capsule Take 1 capsule (20 mg total) by mouth daily before breakfast.   ondansetron (ZOFRAN-ODT) 8 MG disintegrating tablet Take 8 mg by  mouth 3 (three) times daily before meals. As needed for vomiting and nausea   OXYGEN Inhale 2 L into the lungs continuous.   polyethylene glycol (MIRALAX / GLYCOLAX) 17 g packet Take 17 g by mouth daily.   potassium chloride SA (KLOR-CON) 20 MEQ tablet Take 40 mEq by mouth 3 (three) times daily.   rOPINIRole (REQUIP) 0.25 MG tablet Take 0.25 mg by mouth at bedtime.   senna-docusate (SENOKOT-S) 8.6-50 MG tablet Take 1 tablet by mouth at bedtime as needed for mild constipation.   simethicone (MYLICON) 0000000 MG chewable tablet Chew 125 mg by mouth in the morning, at noon, in the evening, and at bedtime.   spironolactone (ALDACTONE) 25 MG tablet Take 25 mg by mouth daily.   venlafaxine XR (EFFEXOR-XR) 150 MG 24 hr capsule Take 150 mg by mouth daily with breakfast.        Arther Abbott, MD  02/22/2021 2:33 PM

## 2021-02-22 NOTE — Addendum Note (Signed)
Addended byCandice Camp on: 02/22/2021 03:27 PM   Modules accepted: Orders, SmartSet

## 2021-02-27 ENCOUNTER — Telehealth: Payer: Self-pay | Admitting: Radiology

## 2021-02-27 NOTE — Telephone Encounter (Signed)
I need to call [facility]  [phone] not working number. I will try again in a bit

## 2021-02-27 NOTE — Telephone Encounter (Signed)
-----   Message from Josue Hector sent at 02/26/2021  2:21 PM EDT ----- I scheduled his PAT for 10/4 at 11:00.  Thanks for your help! ----- Message ----- From: Candice Camp, RT Sent: 02/26/2021   1:38 PM EDT To: Victory Dakin Young  Just let me know when it is and I can let them know.  ----- Message ----- From: Josue Hector Sent: 02/26/2021   1:28 PM EDT To: Candice Camp, RT  Hey,  I spoke with the daughter in law for this patient and she stated that I needed to schedule the PAT with the [facility].  I have not been able to reach them.  Do you have a way of reaching them?  Can I give you a PAT and let you communicate that with them?  I don't usually have to schedule with them and I have tried several numbers and don't seem to be getting an answer.  Thanks,

## 2021-03-01 DIAGNOSIS — F4323 Adjustment disorder with mixed anxiety and depressed mood: Secondary | ICD-10-CM | POA: Diagnosis not present

## 2021-03-05 ENCOUNTER — Telehealth: Payer: Self-pay

## 2021-03-05 DIAGNOSIS — N401 Enlarged prostate with lower urinary tract symptoms: Secondary | ICD-10-CM

## 2021-03-05 DIAGNOSIS — G3184 Mild cognitive impairment, so stated: Secondary | ICD-10-CM | POA: Diagnosis not present

## 2021-03-05 DIAGNOSIS — F321 Major depressive disorder, single episode, moderate: Secondary | ICD-10-CM | POA: Diagnosis not present

## 2021-03-05 DIAGNOSIS — F411 Generalized anxiety disorder: Secondary | ICD-10-CM | POA: Diagnosis not present

## 2021-03-05 DIAGNOSIS — N138 Other obstructive and reflux uropathy: Secondary | ICD-10-CM

## 2021-03-05 NOTE — Telephone Encounter (Signed)
Patient called to ask if he would be a candidate for [facility]. Message sent to MD.

## 2021-03-06 NOTE — Telephone Encounter (Signed)
[phone] is phone number I called and spoke to his nurse to make sure they are aware of the PAT / I spoke to [place] to advise.

## 2021-03-06 NOTE — Telephone Encounter (Signed)
Called patient left message that u/s is ordered and MD will discuss at office visit.

## 2021-03-06 NOTE — Telephone Encounter (Signed)
I left message for patient. Best contact number is [phone]  for scheduling.

## 2021-03-08 DIAGNOSIS — F4323 Adjustment disorder with mixed anxiety and depressed mood: Secondary | ICD-10-CM | POA: Diagnosis not present

## 2021-03-12 NOTE — Patient Instructions (Signed)
Jeffrey Sherman  03/12/2021     @PREFPERIOPPHARMACY @   Your procedure is scheduled on  03/16/2021.   Report to [facility] at  [phone] A.M.   Call this number if you have problems the morning of surgery:  [phone]   Remember:  Do not eat or drink after midnight.      Take these medicines the morning of surgery with A SIP OF WATER          uroxatrol, diltiazem, imdur, namenda, robaxin, metoprolol, omeprazole, zofran (if needed), effexor.     Do not wear jewelry, make-up or nail polish.  Do not wear lotions, powders, or perfumes, or deodorant.  Do not shave 48 hours prior to surgery.  Men may shave face and neck.  Do not bring valuables to the hospital.  [facility] is not responsible for any belongings or valuables.  Contacts, dentures or bridgework may not be worn into surgery.  Leave your suitcase in the car.  After surgery it may be brought to your room.  For patients admitted to the hospital, discharge time will be determined by your treatment team.  Patients discharged the day of surgery will not be allowed to drive home and must have someone with them for 24 hours.    Special instructions:   DO NOT smoke tobacco or vape fore 24 hours before your procedure.  Please read over the following fact sheets that you were given. Coughing and Deep Breathing, Surgical Site Infection Prevention, Anesthesia Post-op Instructions, and Care and Recovery After Surgery      Orthopedic Hardware Removal, Care After This sheet gives you information about how to care for yourself after your procedure. Your health care provider may also give you more specific instructions. If you have problems or questions, contact your health care provider. What can I expect after the procedure? After the procedure, it is common to have: Soreness or pain. Some swelling in the area where the hardware was removed. A small amount of blood or clear fluid coming from your incision. Follow  these instructions at home: If you have a cast: Do not stick anything inside the cast to scratch your skin. Doing that increases your risk of infection. Check the skin around the cast every day. Tell your health care provider about any concerns. You may put lotion on dry skin around the edges of the cast. Do not put lotion on the skin underneath the cast. Keep the cast clean and dry. If you have a splint or boot: Wear the splint or boot as told by your health care provider. Remove it only as told by your health care provider. Loosen the splint or boot if your fingers or toes tingle, become numb, or turn cold and blue. Keep the splint or boot clean and dry. Bathing Do not take baths, swim, or use a hot tub until your health care provider approves. Ask your health care provider if you may take showers. You may only be allowed to take sponge baths. Keep the bandage (dressing) dry until your health care provider says it can be removed. If your cast, splint, or boot is not waterproof: Do not let it get wet. Cover it with a watertight covering when you take a bath or a shower. Incision care  Follow instructions from your health care provider about how to take care of your incision. Make sure you: Wash your hands with soap and water before you change your dressing.  If soap and water are not available, use hand sanitizer. Change your dressing as told by your health care provider. Leave stitches (sutures), skin glue, or adhesive strips in place. These skin closures may need to stay in place for 2 weeks or longer. If adhesive strip edges start to loosen and curl up, you may trim the loose edges. Do not remove adhesive strips completely unless your health care provider tells you to do that. Check your incision area every day for signs of infection. Check for: Redness. More swelling or pain. More fluid or blood. Warmth. Pus or a bad smell. Managing pain, stiffness, and swelling  If directed, put  ice on the affected area: If you have a removable splint or boot, remove it as told by your health care provider. Put ice in a plastic bag. Place a towel between your skin and the bag. Leave the ice on for 20 minutes, 2-3 times a day. Move your fingers or toes often to avoid stiffness and to lessen swelling. Raise (elevate) the injured area above the level of your heart while you are sitting or lying down. Driving Do not drive or use heavy machinery while taking prescription pain medicine. Do not drive for 24 hours if you were given a medicine to help you relax (sedative) during your procedure. Ask your health care provider when it is safe to drive if you have a cast, splint, or boot on the affected limb. Activity Ask your health care provider what activities are safe for you during recovery, and ask what activities you need to avoid. Do not use the injured limb to support your body weight until your health care provider says that you can. Do not play contact sports until your health care provider approves. Do exercises as told by your health care provider. Avoid sitting for a long time without moving. Get up and move around at least every few hours. This will help prevent blood clots. General instructions Do not put pressure on any part of the cast or splint until it is fully hardened. This may take several hours. If you are taking prescription pain medicine, take actions to prevent or treat constipation. Your health care provider may recommend that you: Drink enough fluid to keep your urine pale yellow. Eat foods that are high in fiber, such as fresh fruits and vegetables, whole grains, and beans. Limit foods that are high in fat and processed sugars, such as fried or sweet foods. Take an over-the-counter or prescription medicine for constipation. Do not use any products that contain nicotine or tobacco, such as cigarettes and e-cigarettes. These can delay bone healing after surgery. If you  need help quitting, ask your health care provider. Take over-the-counter and prescription medicines only as told by your health care provider. Keep all follow-up visits as told by your health care provider. This is important. Contact a health care provider if: You have lasting pain. You have redness around your incision. You have more swelling or pain around your incision. You have more fluid or blood coming from your incision. Your incision feels warm to the touch. You have pus or a bad smell coming from your incision. You are unable to do exercises or physical activity as told by your health care provider. Get help right away if: You have difficulty breathing. You have chest pain. You have severe pain. You have a fever or chills. You have numbness for more than 24 hours in the area where the hardware was removed. Summary After  the procedure, it is common to have some pain and swelling in the area where the hardware was removed. Follow instructions from your health care provider about how to take care of your incision. Return to your normal activities as told by your health care provider. Ask your health care provider what activities are safe for you. This information is not intended to replace advice given to you by your health care provider. Make sure you discuss any questions you have with your health care provider. Document Revised: 08/11/2020 Document Reviewed: 08/11/2020 Elsevier Patient Education  [place] Anesthesia, Adult, Care After This sheet gives you information about how to care for yourself after your procedure. Your health care provider may also give you more specific instructions. If you have problems or questions, contact your health care provider. What can I expect after the procedure? After the procedure, the following side effects are common: Pain or discomfort at the IV site. Nausea. Vomiting. Sore throat. Trouble concentrating. Feeling cold  or chills. Feeling weak or tired. Sleepiness and fatigue. Soreness and body aches. These side effects can affect parts of the body that were not involved in surgery. Follow these instructions at home: For the time period you were told by your health care provider:  Rest. Do not participate in activities where you could fall or become injured. Do not drive or use machinery. Do not drink alcohol. Do not take sleeping pills or medicines that cause drowsiness. Do not make important decisions or sign legal documents. Do not take care of children on your own. Eating and drinking Follow any instructions from your health care provider about eating or drinking restrictions. When you feel hungry, start by eating small amounts of foods that are soft and easy to digest (bland), such as toast. Gradually return to your regular diet. Drink enough fluid to keep your urine pale yellow. If you vomit, rehydrate by drinking water, juice, or clear broth. General instructions If you have sleep apnea, surgery and certain medicines can increase your risk for breathing problems. Follow instructions from your health care provider about wearing your sleep device: Anytime you are sleeping, including during daytime naps. While taking prescription pain medicines, sleeping medicines, or medicines that make you drowsy. Have a responsible adult stay with you for the time you are told. It is important to have someone help care for you until you are awake and alert. Return to your normal activities as told by your health care provider. Ask your health care provider what activities are safe for you. Take over-the-counter and prescription medicines only as told by your health care provider. If you smoke, do not smoke without supervision. Keep all follow-up visits as told by your health care provider. This is important. Contact a health care provider if: You have nausea or vomiting that does not get better with  medicine. You cannot eat or drink without vomiting. You have pain that does not get better with medicine. You are unable to pass urine. You develop a skin rash. You have a fever. You have redness around your IV site that gets worse. Get help right away if: You have difficulty breathing. You have chest pain. You have blood in your urine or stool, or you vomit blood. Summary After the procedure, it is common to have a sore throat or nausea. It is also common to feel tired. Have a responsible adult stay with you for the time you are told. It is important to have someone help care for  you until you are awake and alert. When you feel hungry, start by eating small amounts of foods that are soft and easy to digest (bland), such as toast. Gradually return to your regular diet. Drink enough fluid to keep your urine pale yellow. Return to your normal activities as told by your health care provider. Ask your health care provider what activities are safe for you. This information is not intended to replace advice given to you by your health care provider. Make sure you discuss any questions you have with your health care provider. Document Revised: 02/10/2020 Document Reviewed: 09/09/2019 Elsevier Patient Education  2022 [place]. How to Use Chlorhexidine for Bathing Chlorhexidine gluconate (CHG) is a germ-killing (antiseptic) solution that is used to clean the skin. It can get rid of the bacteria that normally live on the skin and can keep them away for about 24 hours. To clean your skin with CHG, you may be given: A CHG solution to use in the shower or as part of a sponge bath. A prepackaged cloth that contains CHG. Cleaning your skin with CHG may help lower the risk for infection: While you are staying in the intensive care unit of the hospital. If you have a vascular access, such as a central line, to provide short-term or long-term access to your veins. If you have a catheter to drain urine  from your bladder. If you are on a ventilator. A ventilator is a machine that helps you breathe by moving air in and out of your lungs. After surgery. What are the risks? Risks of using CHG include: A skin reaction. Hearing loss, if CHG gets in your ears and you have a perforated eardrum. Eye injury, if CHG gets in your eyes and is not rinsed out. The CHG product catching fire. Make sure that you avoid smoking and flames after applying CHG to your skin. Do not use CHG: If you have a chlorhexidine allergy or have previously reacted to chlorhexidine. On babies younger than 60 months of age. How to use CHG solution Use CHG only as told by your health care provider, and follow the instructions on the label. Use the full amount of CHG as directed. Usually, this is one bottle. During a shower Follow these steps when using CHG solution during a shower (unless your health care provider gives you different instructions): Start the shower. Use your normal soap and shampoo to wash your face and hair. Turn off the shower or move out of the shower stream. Pour the CHG onto a clean washcloth. Do not use any type of brush or rough-edged sponge. Starting at your neck, lather your body down to your toes. Make sure you follow these instructions: If you will be having surgery, pay special attention to the part of your body where you will be having surgery. Scrub this area for at least 1 minute. Do not use CHG on your head or face. If the solution gets into your ears or eyes, rinse them well with water. Avoid your genital area. Avoid any areas of skin that have broken skin, cuts, or scrapes. Scrub your back and under your arms. Make sure to wash skin folds. Let the lather sit on your skin for 1-2 minutes or as long as told by your health care provider. Thoroughly rinse your entire body in the shower. Make sure that all body creases and crevices are rinsed well. Dry off with a clean towel. Do not put any  substances on your body afterward--such  as powder, lotion, or perfume--unless you are told to do so by your health care provider. Only use lotions that are recommended by the manufacturer. Put on clean clothes or pajamas. If it is the night before your surgery, sleep in clean sheets.  During a sponge bath Follow these steps when using CHG solution during a sponge bath (unless your health care provider gives you different instructions): Use your normal soap and shampoo to wash your face and hair. Pour the CHG onto a clean washcloth. Starting at your neck, lather your body down to your toes. Make sure you follow these instructions: If you will be having surgery, pay special attention to the part of your body where you will be having surgery. Scrub this area for at least 1 minute. Do not use CHG on your head or face. If the solution gets into your ears or eyes, rinse them well with water. Avoid your genital area. Avoid any areas of skin that have broken skin, cuts, or scrapes. Scrub your back and under your arms. Make sure to wash skin folds. Let the lather sit on your skin for 1-2 minutes or as long as told by your health care provider. Using a different clean, wet washcloth, thoroughly rinse your entire body. Make sure that all body creases and crevices are rinsed well. Dry off with a clean towel. Do not put any substances on your body afterward--such as powder, lotion, or perfume--unless you are told to do so by your health care provider. Only use lotions that are recommended by the manufacturer. Put on clean clothes or pajamas. If it is the night before your surgery, sleep in clean sheets. How to use CHG prepackaged cloths Only use CHG cloths as told by your health care provider, and follow the instructions on the label. Use the CHG cloth on clean, dry skin. Do not use the CHG cloth on your head or face unless your health care provider tells you to. When washing with the CHG cloth: Avoid  your genital area. Avoid any areas of skin that have broken skin, cuts, or scrapes. Before surgery Follow these steps when using a CHG cloth to clean before surgery (unless your health care provider gives you different instructions): Using the CHG cloth, vigorously scrub the part of your body where you will be having surgery. Scrub using a back-and-forth motion for 3 minutes. The area on your body should be completely wet with CHG when you are done scrubbing. Do not rinse. Discard the cloth and let the area air-dry. Do not put any substances on the area afterward, such as powder, lotion, or perfume. Put on clean clothes or pajamas. If it is the night before your surgery, sleep in clean sheets.  For general bathing Follow these steps when using CHG cloths for general bathing (unless your health care provider gives you different instructions). Use a separate CHG cloth for each area of your body. Make sure you wash between any folds of skin and between your fingers and toes. Wash your body in the following order, switching to a new cloth after each step: The front of your neck, shoulders, and chest. Both of your arms, under your arms, and your hands. Your stomach and groin area, avoiding the genitals. Your right leg and foot. Your left leg and foot. The back of your neck, your back, and your buttocks. Do not rinse. Discard the cloth and let the area air-dry. Do not put any substances on your body afterward--such as powder, lotion,  or perfume--unless you are told to do so by your health care provider. Only use lotions that are recommended by the manufacturer. Put on clean clothes or pajamas. Contact a health care provider if: Your skin gets irritated after scrubbing. You have questions about using your solution or cloth. You swallow any chlorhexidine. Call your local poison control center (1-340-246-6622 in the [place] Get help right away if: Your eyes itch badly, or they become very red or  swollen. Your skin itches badly and is red or swollen. Your hearing changes. You have trouble seeing. You have swelling or tingling in your mouth or throat. You have trouble breathing. These symptoms may represent a serious problem that is an emergency. Do not wait to see if the symptoms will go away. Get medical help right away. Call your local emergency services (911 in the [place] Do not drive yourself to the hospital. Summary Chlorhexidine gluconate (CHG) is a germ-killing (antiseptic) solution that is used to clean the skin. Cleaning your skin with CHG may help to lower your risk for infection. You may be given CHG to use for bathing. It may be in a bottle or in a prepackaged cloth to use on your skin. Carefully follow your health care provider's instructions and the instructions on the product label. Do not use CHG if you have a chlorhexidine allergy. Contact your health care provider if your skin gets irritated after scrubbing. This information is not intended to replace advice given to you by your health care provider. Make sure you discuss any questions you have with your health care provider. Document Revised: 08/07/2020 Document Reviewed: 08/07/2020 Elsevier Patient Education  2022 [employer].

## 2021-03-13 ENCOUNTER — Encounter (HOSPITAL_COMMUNITY)
Admit: 2021-03-13 | Discharge: 2021-03-13 | Disposition: A | Discharge status: Home / Self Care | Payer: Medicare Other | Source: Ambulatory Visit | Attending: Orthopedic Surgery | Admitting: Orthopedic Surgery

## 2021-03-13 ENCOUNTER — Encounter (HOSPITAL_COMMUNITY): Payer: Self-pay

## 2021-03-13 ENCOUNTER — Other Ambulatory Visit: Payer: Self-pay

## 2021-03-13 DIAGNOSIS — Z01812 Encounter for preprocedural laboratory examination: Secondary | ICD-10-CM | POA: Insufficient documentation

## 2021-03-13 LAB — CBC WITH DIFFERENTIAL/PLATELET
Abs Immature Granulocytes: 0.03 10*3/uL (ref 0.00–0.07)
Basophils Absolute: 0 10*3/uL (ref 0.0–0.1)
Basophils Relative: 0 %
Eosinophils Absolute: 0.1 10*3/uL (ref 0.0–0.5)
Eosinophils Relative: 2 %
HCT: 42.5 % (ref 39.0–52.0)
Hemoglobin: 14 g/dL (ref 13.0–17.0)
Immature Granulocytes: 0 %
Lymphocytes Relative: 26 %
Lymphs Abs: 1.9 10*3/uL (ref 0.7–4.0)
MCH: 28.2 pg (ref 26.0–34.0)
MCHC: 32.9 g/dL (ref 30.0–36.0)
MCV: 85.5 fL (ref 80.0–100.0)
Monocytes Absolute: 0.5 10*3/uL (ref 0.1–1.0)
Monocytes Relative: 7 %
Neutro Abs: 4.7 10*3/uL (ref 1.7–7.7)
Neutrophils Relative %: 65 %
Platelets: 212 10*3/uL (ref 150–400)
RBC: 4.97 MIL/uL (ref 4.22–5.81)
RDW: 14.2 % (ref 11.5–15.5)
WBC: 7.3 10*3/uL (ref 4.0–10.5)
nRBC: 0 % (ref 0.0–0.2)

## 2021-03-13 LAB — BASIC METABOLIC PANEL
Anion gap: 6 (ref 5–15)
BUN: 24 mg/dL — ABNORMAL HIGH (ref 8–23)
CO2: 30 mmol/L (ref 22–32)
Calcium: 9.6 mg/dL (ref 8.9–10.3)
Chloride: 103 mmol/L (ref 98–111)
Creatinine, Ser: 1.03 mg/dL (ref 0.61–1.24)
GFR, Estimated: >60 mL/min (ref >60)
Glucose, Bld: 126 mg/dL — ABNORMAL HIGH (ref 70–99)
Potassium: 3.9 mmol/L (ref 3.5–5.1)
Sodium: 139 mmol/L (ref 135–145)

## 2021-03-14 DIAGNOSIS — Z23 Encounter for immunization: Secondary | ICD-10-CM | POA: Diagnosis not present

## 2021-03-15 ENCOUNTER — Non-Acute Institutional Stay (SKILLED_NURSING_FACILITY): Payer: Medicare Other | Admitting: Adult Health

## 2021-03-15 ENCOUNTER — Other Ambulatory Visit (HOSPITAL_COMMUNITY)
Admission: RE | Admit: 2021-03-15 | Discharge: 2021-03-15 | Disposition: A | Discharge status: Home / Self Care | Payer: Medicare Other | Source: Skilled Nursing Facility | Attending: Adult Health | Admitting: Adult Health

## 2021-03-15 DIAGNOSIS — I700 Atherosclerosis of aorta: Secondary | ICD-10-CM

## 2021-03-15 DIAGNOSIS — E43 Unspecified severe protein-calorie malnutrition: Secondary | ICD-10-CM | POA: Diagnosis not present

## 2021-03-15 DIAGNOSIS — N179 Acute kidney failure, unspecified: Secondary | ICD-10-CM | POA: Insufficient documentation

## 2021-03-15 DIAGNOSIS — N183 Chronic kidney disease, stage 3 unspecified: Secondary | ICD-10-CM | POA: Diagnosis not present

## 2021-03-15 DIAGNOSIS — K219 Gastro-esophageal reflux disease without esophagitis: Secondary | ICD-10-CM

## 2021-03-15 DIAGNOSIS — I129 Hypertensive chronic kidney disease with stage 1 through stage 4 chronic kidney disease, or unspecified chronic kidney disease: Secondary | ICD-10-CM

## 2021-03-15 DIAGNOSIS — R739 Hyperglycemia, unspecified: Secondary | ICD-10-CM | POA: Insufficient documentation

## 2021-03-15 LAB — HEMOGLOBIN A1C
Hgb A1c MFr Bld: 5.7 % — ABNORMAL HIGH (ref 4.8–5.6)
Mean Plasma Glucose: 116.89 mg/dL

## 2021-03-15 NOTE — Progress Notes (Signed)
Location:  [facility] Room Number: 41 Place of Service:  SNF (31)   CODE STATUS: dnr   Allergies  Allergen Reactions   Hctz [Hydrochlorothiazide]     hypokalemia    Chief Complaint  Patient presents with   Medical Management of Chronic Issues             Aortic atherosclerosis    Protein calorie malnutrition severe:   benign hypertension with chronic kidney disease stage III:  Gastroesophageal reflux disease without esophagitis    HPI:  He is a 87 year old long term resident of this facility being seen for the management of his chronic illnesses: Aortic atherosclerosis    Protein calorie malnutrition severe:   Essential hypertension:  Gastroesophageal reflux disease without esophagitis. There are no reports of uncontrolled pain. No changes in appetite. No reports of insomnia. He is due to have his hip hardware removed in the near future.   Past Medical History:  Diagnosis Date   Anxiety    Cancer [facility])    prostate   Chronic chest wall pain    Essential hypertension, benign 06/20/2016   GAD (generalized anxiety disorder)    GERD (gastroesophageal reflux disease)    H/O echocardiogram 2016   normal   Heart murmur    "leaking heart valve"   High cholesterol 06/20/2016   HOH (hard of hearing)    left   Hx of falling    Interstitial pulmonary disease (HCC)    MDD (major depressive disorder)    Murmur, cardiac    Poor historian    PUD (peptic ulcer disease)    RLS (restless legs syndrome)    Shortness of breath    Wrist fracture 12/16/2011    Past Surgical History:  Procedure Laterality Date   CHOLECYSTECTOMY N/A 06/28/2020   Procedure: LAPAROSCOPIC CHOLECYSTECTOMY;  Surgeon: [name], MD;  Location: [facility] ORS;  Service: General;  Laterality: N/A;  pt in [place]  2005   Rehman: Sigmoid colon diverticulosis, moderate sized external hemorrhoids. A few focal areas of mucosal erythema felt to be nonspecific.    ESOPHAGOGASTRODUODENOSCOPY     ESOPHAGOGASTRODUODENOSCOPY N/A 07/18/2016   Rehman: normal exam except duodenal scar. h.pylori serologies negative   FLEXIBLE SIGMOIDOSCOPY N/A 06/11/2020   Procedure: FLEXIBLE SIGMOIDOSCOPY;  Surgeon: Eloise Harman, DO;  Location: [facility] ENDO SUITE;  Service: Endoscopy;  Laterality: N/A;   HIP PINNING,CANNULATED Right 03/10/2020   Procedure: INTERNAL FIXATION RIGHT HIP;  Surgeon: Carole Civil, MD;  Location: [facility] ORS;  Service: Orthopedics;  Laterality: Right;   ORIF WRIST FRACTURE  12/19/2011   Procedure: OPEN REDUCTION INTERNAL FIXATION (ORIF) WRIST FRACTURE;  Surgeon: Carole Civil, MD;  Location: [facility];  Service: Orthopedics;  Laterality: Right;   prostate cancer     Diagnosed in the 90s   TONSILLECTOMY      Social History   Socioeconomic History   Marital status: Widowed    Spouse name: Not on file   Number of children: Not on file   Years of education: Not on file   Highest education level: Not on file  Occupational History   Occupation: retired    Fish farm manager: RETIRED  Tobacco Use   Smoking status: Never   Smokeless tobacco: Never  Vaping Use   Vaping Use: Never used  Substance and Sexual Activity   Alcohol use: No   Drug use: No   Sexual activity: Not Currently  Other Topics Concern  Not on file  Social History Narrative   Is long term resident of [facility]    Social Determinants of Health   Financial Resource Strain: Not on file  Food Insecurity: Not on file  Transportation Needs: Not on file  Physical Activity: Not on file  Stress: Not on file  Social Connections: Not on file  Intimate Partner Violence: Not on file   Family History  Problem Relation Age of Onset   Cancer Other    Heart attack Mother    Cancer Brother    Heart attack Brother    Heart disease Sister        by pass      VITAL SIGNS BP 137/61   Pulse 68   Temp (!) 97.4 F (36.3 C)   Ht 5\' 9"  (1.753 m)   Wt 173 lb 3.2 oz (78.6 kg)   BMI 25.58 kg/m    Outpatient Encounter Medications as of 03/15/2021  Medication Sig Note   acetaminophen (TYLENOL) 500 MG tablet Take 500 mg by mouth every 6 (six) hours. Max 3000mg  in 24 hour period    alfuzosin (UROXATRAL) 10 MG 24 hr tablet Take 10 mg by mouth daily with breakfast.    antiseptic oral rinse (BIOTENE) LIQD 10 mLs by Mouth Rinse route in the morning and at bedtime.    Ascorbic Acid (VITAMIN C) 1000 MG tablet Take 1,000 mg by mouth daily.    aspirin 81 MG chewable tablet Chew 1 tablet (81 mg total) by mouth daily with breakfast.    Balsam [place]-Castor Oil (VENELEX) OINT Apply 1 application topically every 8 (eight) hours as needed (Apply to bilateral buttocks & sacral area qshift for blanchable erythema.).    calcium carbonate (TUMS - DOSED IN MG ELEMENTAL CALCIUM) 500 MG chewable tablet Chew 1 tablet by mouth 2 (two) times daily as needed for indigestion or heartburn.     diltiazem (CARDIZEM CD) 360 MG 24 hr capsule Take 1 capsule (360 mg total) by mouth daily.    ferrous sulfate 325 (65 FE) MG tablet Take 325 mg by mouth daily with breakfast. For Anemia and  Iron Deficiency    Flax Oil-Fish Oil-Borage Oil (FISH OIL-FLAX OIL-BORAGE OIL) CAPS Take 1 capsule by mouth daily.    hydrochlorothiazide (MICROZIDE) 12.5 MG capsule Take 12.5 mg by mouth daily.    ipratropium-albuterol (DUONEB) 0.5-2.5 (3) MG/3ML SOLN Take 3 mLs by nebulization every 6 (six) hours as needed (shortness of breath or wheezing).    isosorbide mononitrate (IMDUR) 30 MG 24 hr tablet Take 1 tablet (30 mg total) by mouth daily.    lisinopril (ZESTRIL) 40 MG tablet Take 1 tablet (40 mg total) by mouth daily.    loperamide (IMODIUM A-D) 2 MG tablet Take 2 mg by mouth as needed (after each stool. (do not exceed more than 4 tabs in 24 hours)).    Melatonin 10 MG TABS Take 10 mg by mouth at bedtime.    meloxicam (MOBIC) 7.5 MG tablet Take 7.5 mg by mouth daily. give for left hip pain    memantine (NAMENDA) 10 MG tablet Take 10 mg by  mouth 2 (two) times daily.    methocarbamol (ROBAXIN) 500 MG tablet Take 500 mg by mouth daily.    metoprolol tartrate (LOPRESSOR) 25 MG tablet Take 1 tablet (25 mg total) by mouth 2 (two) times daily.    omeprazole (PRILOSEC) 20 MG capsule Take 1 capsule (20 mg total) by mouth daily before breakfast.    ondansetron (ZOFRAN-ODT)  8 MG disintegrating tablet Take 8 mg by mouth 3 (three) times daily as needed for vomiting or nausea (before each meal as needed).    polyethylene glycol (MIRALAX / GLYCOLAX) 17 g packet Take 17 g by mouth daily.    potassium chloride SA (KLOR-CON) 20 MEQ tablet Take 40 mEq by mouth 3 (three) times daily. 03/09/2021: Long term regimen. No duration specified.   rOPINIRole (REQUIP) 0.25 MG tablet Take 0.25 mg by mouth at bedtime.    senna-docusate (SENOKOT-S) 8.6-50 MG tablet Take 1 tablet by mouth at bedtime as needed for mild constipation.    simethicone (MYLICON) 854 MG chewable tablet Chew 125 mg by mouth 3 (three) times daily as needed ("increased gas").    spironolactone (ALDACTONE) 25 MG tablet Take 25 mg by mouth daily.    venlafaxine XR (EFFEXOR-XR) 150 MG 24 hr capsule Take 150 mg by mouth daily with breakfast.    No facility-administered encounter medications on file as of 03/15/2021.     SIGNIFICANT DIAGNOSTIC EXAMS  PREVIOUS   06-09-20: ct of abdomen and pelvis:  Cholelithiasis. Haziness noted around the gallbladder. Cannot exclude acute cholecystitis. Continued wall thickening in the left colon from the distal transverse colon through the distal descending colon concerning for colitis. Left colonic diverticulosis. Small bilateral pleural effusions, increasing since prior study. Bibasilar atelectasis. Coronary artery disease, aortic atherosclerosis.  06-09-20: abdominal ultrasound:  1. Moderate gallbladder sludge with probable punctate stones. Slight increased wall thickness with small amount of fluid adjacent to the gallbladder but negative sonographic  Murphy. Findings are suggestive of but not definitive for cholecystitis; consider correlation with nuclear medicine hepatobiliary imaging. 6 mm gallbladder polyp. 2. Simple appearing renal cysts. 3. Incidental note made of pleural effusions.  06-12-20: HIDA:  1. No scintigraphic evidence of acute cholecystitis. 2. Findings compatible with severe biliary dyskinesia with little to no gallbladder emptying and elicitation of abdominal pain with the CCK administration.  06-13-20: chest x-ray:  Diffuse interstitial opacity with Awanda Mink lines with possible trace pleural fluid. Stable heart size and aortic contours. Negative for air leak  NO NEW EXAMS.    LABS REVIEWED PREVIOUS    03-10-20: wbc 11.2; hgb 11.3; hct 35.9; mcv 82.7 plt 188; glucose 160; bun 18; creat 1.21; k+ 3.3; na++ 139; ca 8.8 liver normal 3.0; mag 2.3 03-12-20: wbc 10.4; hgb 9.3; hct 29.6; mcv 84.3 plt 178; glucose 138; bun 34; creat 1.77; k+ 3.8; na++ 138; ca 8.3 03-14-20: glucose 134; bun 36; creat 1.23; k+ 3.2; na++ 139; ca 8.8 mag 2.1   03-16-20: hgb 10.7; hct 33.4 glucose 127; bun 18; creat 0.86 k+ 3.3; na++ 139; ca 9.2 ;liver normal albumin 2.5 iron 23; tibc 221; ferritin 137 03-23-20: k+ 3.0  03-27-20: glucose 133; bun 34; creat 0.85; k+ 3.6; na++ 142; ca 9.0; AM cortisol: 10.6  05-22-20: wbc 19.4; hgb 14.6; hct 43.8; mcv 81.6 plt 254; glucose 211; bun 35; creat 1.20; k+ 3.0; na++ 138; ca 9.8; liver normal albumin 3.5 mag 1.9; urine culture: <10,000; blood culture: no growth 05-25-20: wbc 14.2; hgb 20.8; hct 34.8; mcv 84.7 plt 212; glucose 109; bun 24; creat 0.86; k+ 3.4; na++ 137; ca 8.9; mag 1.9 05-28-20: wbc 10.9; hgb 11.8; hct 36.8; mcv 81.8 plt 227; glucose 132; bun 10; creat 0.82; k+ 3.0; na++ 137; ca 8.7 06-01-20: wbc 12.2; hgb 12.5; hct 40.3; mcv 81.9 plt 136; glucose 136; bun 12; creat 0.81; k+ 3.0; na++ 140; ca 8.9 mag 1.9 06-05-20: wbc 13.6; hgb 10.2; hct  32.9; mcv 93.5 plt 266 glucose 125; bun 35; creat 1.26; k+ 3.5;  na++ 137; ca 8.9  06-07-20: wbc 11.9; hgb 9.6; hct 29.8; mcv 82.8 plt 318; glucose 114; bun 42; creat 1.47; k+ 3.9; na++ 138; ca 8.8 GFR 46; liver normal albumin 2.1  06-08-20: glucose 107; bun 57; creat 1.81; k+ 4.3; na++ 135; ca 8.9 GFR 36 06-09-20: glucose 107; bun 52; creat 1.64; k+ 5.3; na++ 136; ca 9.3;  GFR 40; liver normal albumin 2.3 06-10-20: wbc 12.6; hgb 10.5; hct 34.2; mcv 83.4 plt 417; glucose 126; bun 39; creat 4.4; na++ 137; ca 9.5 GFR 58; liver normal albumin 2.2 06-12-20: wbc 8.2; hgb 10.4; hct 33.6; mcv 81.2 plt 364; glucose 123; bun 24; creat 0.87; k+ 3.5; na++ 138; GFR >60; liver normal albumin 1.9 06-13-20: urine culture: <10,000 colonies 06-14-20: wbc 9.0; hgb 10.5; hct 33.8; mcv 80.5 plt 267; glucose 122; bun 20; creat 0.97; k+3.3; na++ 140; ca 9.0 GFR >60 06-29-20: wbc 10.1; hgb 10.7; hct 36.1; mcv 83.0 plt 294; glucose 152; bun 26; creat 1.02; k+ 4.4; na++ 138 ca 8.8 GFR>60 07-24-20 PSA 16.0 08-14-20: glucose 110; bun 33; creat 1.00 ;k+ 2.5 na++ 140 08-15-20: glucose 114; bun 31; creat  0.84; k+ 3.1; na++ 142 08-21-20: k+ 3.5 10-04-20: albumin 3.2  10-19-20: PSA 12.25 (13.05)  12-21-20: wbc 4.6; hgb 13.4 hct 40.9 mcv 86.1 plt 223; glucose 104; bun 22; creat 0.89; k+ 3.0; na++ 143; ca 9.3 GFR>60; liver normal albumin 3.3; chol 128; ldl 71; trig 85 hdl 40   12-25-20: k+ 3.2 01-01-21: k+ 3.2 01-08-21: k+ 4.5 01-19-21: glucose 108; bun 20; creat 0.96; k+ 3.6; na++ 139; ca 9.0 GFR>60 02-05-21: glucose 109; bun 25; creat 1.05; k+ 4.5; na++ 141; ca 9.5; GFR>60.   TODAY  03-13-21: wbc 7.3; hgb 14.0; hct 42.5; mcv 85.5 plt 212; glucose 126; bun 24; creat 1.03 ;k+ 3.9; na++ 139; ca 9.6; GFR>60   Review of Systems  Constitutional:  Negative for malaise/fatigue.  Respiratory:  Negative for cough and shortness of breath.   Cardiovascular:  Negative for chest pain, palpitations and leg swelling.  Gastrointestinal:  Negative for abdominal pain, constipation and heartburn.  Musculoskeletal:   Negative for back pain, joint pain and myalgias.  Skin: Negative.   Neurological:  Negative for dizziness.  Psychiatric/Behavioral:  The patient is not nervous/anxious.      Physical Exam Constitutional:      General: He is not in acute distress.    Appearance: He is well-developed. He is not diaphoretic.  Neck:     Thyroid: No thyromegaly.  Cardiovascular:     Rate and Rhythm: Normal rate and regular rhythm.     Pulses: Normal pulses.     Heart sounds: Normal heart sounds.  Pulmonary:     Effort: Pulmonary effort is normal. No respiratory distress.     Breath sounds: Normal breath sounds.  Abdominal:     General: Bowel sounds are normal. There is no distension.     Palpations: Abdomen is soft.     Tenderness: There is no abdominal tenderness.  Musculoskeletal:        General: Normal range of motion.     Cervical back: Neck supple.     Right lower leg: No edema.     Left lower leg: No edema.  Lymphadenopathy:     Cervical: No cervical adenopathy.  Skin:    General: Skin is warm and dry.  Neurological:     Mental Status:  He is alert. Mental status is at baseline.  Psychiatric:        Mood and Affect: Mood normal.     ASSESSMENT/ PLAN:  TODAY  Aortic atherosclerosis (ct 06-09-20) will continue asa 81 mg daily   2. Protein calorie malnutrition severe: is stable albumin 3.2 will continue prostat 20 mL daily  3. Benign hypertension with chronic kidney disease stage III: is stable b/p 137/61 will continue lopressor 25 mg twice daily cardizem cd 360 mg daily lisinopril hct 40/125 mg daily  4. Gastroesophageal reflux disease without esophagitis: is stable will continue prilosec 20 mg daily   PREVIOUS   5. Prostate cancer/overactive bladder: is stable will continue urosatral 10 mg daily no oxybutrin due to history of fracture; is off myrbetriq due to hypertension  is followed by urology;  6. Hypokalemia: is stable k+ 3.9 will continue k+ 20 meq twice daily   7.   Dyslipidemia: is stable will continue fish oil daily is off zocor  8. Chronic constipation: is stable will continue miralax daily and senna s as needed  9. Major depression recurrent chronic: is stable will continue effexor xr 150 mg daily and melatonin 10 mg nightly for sleep  10.RLS: is stable will continue requip 0.25 mg nightly   11. Iron deficiency anemia due to dietary intake: is stable hgb 14.0 will continue iron daily  12. Interstitial lung disease: is stable has prn 02  13. Left hip pain: is stable will continue voltaren gel 4 gm twice daily   14. Dementia without behavioral disturbance unspecified dementia type: is stable weight is 173 pounds; will continue namenda 10 mg twice daily    Will check hgb a1c   Ok Edwards NP [facility]  Contact [phone] Monday through Friday 8am- 5pm  After hours call [phone]

## 2021-03-16 DIAGNOSIS — F331 Major depressive disorder, recurrent, moderate: Secondary | ICD-10-CM | POA: Diagnosis not present

## 2021-03-19 ENCOUNTER — Other Ambulatory Visit: Payer: Self-pay

## 2021-03-19 ENCOUNTER — Encounter: Payer: Self-pay | Admitting: Adult Health

## 2021-03-19 ENCOUNTER — Encounter (HOSPITAL_COMMUNITY)
Admit: 2021-03-19 | Discharge: 2021-03-19 | Disposition: A | Discharge status: Home / Self Care | Payer: Medicare Other | Attending: Orthopedic Surgery | Admitting: Orthopedic Surgery

## 2021-03-19 NOTE — H&P (Signed)
Summary assessment and plan:        Encounter Diagnosis  Name Primary?   Closed displaced fracture of right femoral neck ([place]) 03/10/20 cannulated screw fixation  Yes      87 year old male had a percutaneous pinning of his right hip fracture last year he is here today to get the screws taken out as some of them have backed out  Plan for hardware removal right hip  Precautions of possible refracture breakage of the screws infection were given        Chief Complaint  Patient presents with   Hip Pain      Right/ ready to schedule surgery for hip screw removal Will be one year soon surgery 03/10/20    87 year old male status post ORIF right hip October 2021 with 4 screws and washers   He has pain over the greater trochanter and prominent screws which x-rays show a backed out he would like them removed he is here for preop       MEDICAL DECISION MAKING   A.      Encounter Diagnosis  Name Primary?   Closed displaced fracture of right femoral neck ([place]) 03/10/20 cannulated screw fixation  Yes      B. DATA ANALYSED:   IMAGING: Interpretation of images: Internal images from last visit show the screws backing out therefore screws in a box configuration for washers  Orders: Surgery orders placed  Outside records reviewed: None needed    C. MANAGEMENT    Preop for surgery   No orders of the defined types were placed in this encounter.       BP 128/69   Pulse (!) 57   Ht 5\' 8"  (1.727 m)   Wt 175 lb (79.4 kg)   BMI 26.61 kg/m      General appearance: Well-developed well-nourished no gross deformities  Cardiovascular normal pulse and perfusion normal color without edema  Neurologically no sensation loss or deficits or pathologic reflexes   Psychological: Awake alert and oriented x3 mood and affect normal   Skin no lacerations or ulcerations no nodularity no palpable masses, no erythema or nodularity   Musculoskeletal: Tenderness over the greater trochanter on  the right screws are prominent no wound infection hip range of motion is normal     ROS No new acute symptoms under review of systems       Past Medical History:  Diagnosis Date   Anxiety     Cancer ([place])      prostate   Chronic chest wall pain     Essential hypertension, benign 06/20/2016   GAD (generalized anxiety disorder)     GERD (gastroesophageal reflux disease)     H/O echocardiogram 2016    normal   Heart murmur      "leaking heart valve"   High cholesterol 06/20/2016   HOH (hard of hearing)      left   Hx of falling     Interstitial pulmonary disease (HCC)     MDD (major depressive disorder)     Murmur, cardiac     Poor historian     PUD (peptic ulcer disease)     RLS (restless legs syndrome)     Shortness of breath     Wrist fracture 12/16/2011           Past Surgical History:  Procedure Laterality Date   CHOLECYSTECTOMY N/A 06/28/2020    Procedure: LAPAROSCOPIC CHOLECYSTECTOMY;  Surgeon: [name], MD;  Location: [facility];  Service: General;  Laterality: N/A;  pt in [place]   COLONOSCOPY   2005    Rehman: Sigmoid colon diverticulosis, moderate sized external hemorrhoids. A few focal areas of mucosal erythema felt to be nonspecific.   ESOPHAGOGASTRODUODENOSCOPY       ESOPHAGOGASTRODUODENOSCOPY N/A 07/18/2016    Rehman: normal exam except duodenal scar. h.pylori serologies negative   FLEXIBLE SIGMOIDOSCOPY N/A 06/11/2020    Procedure: FLEXIBLE SIGMOIDOSCOPY;  Surgeon: Eloise Harman, DO;  Location: [facility];  Service: Endoscopy;  Laterality: N/A;   HIP PINNING,CANNULATED Right 03/10/2020    Procedure: INTERNAL FIXATION RIGHT HIP;  Surgeon: Carole Civil, MD;  Location: [facility];  Service: Orthopedics;  Laterality: Right;   ORIF WRIST FRACTURE   12/19/2011    Procedure: OPEN REDUCTION INTERNAL FIXATION (ORIF) WRIST FRACTURE;  Surgeon: Carole Civil, MD;  Location: [facility];  Service: Orthopedics;  Laterality: Right;   prostate cancer         Diagnosed in the 90s   TONSILLECTOMY               Family History  Problem Relation Age of Onset   Cancer Other     Heart attack Mother     Cancer Brother     Heart attack Brother     Heart disease Sister          by pass    Social History        Tobacco Use   Smoking status: Never   Smokeless tobacco: Never  Vaping Use   Vaping Use: Never used  Substance Use Topics   Alcohol use: No   Drug use: No           Allergies  Allergen Reactions   Hctz [Hydrochlorothiazide]        hypokalemia      Active Medications      Current Meds  Medication Sig   acetaminophen (TYLENOL) 500 MG tablet Take 500 mg by mouth every 6 (six) hours.   alfuzosin (UROXATRAL) 10 MG 24 hr tablet Take 10 mg by mouth daily with breakfast.   Ascorbic Acid (VITAMIN C) 1000 MG tablet Take 1,000 mg by mouth daily.   aspirin 81 MG chewable tablet Chew 1 tablet (81 mg total) by mouth daily with breakfast.   Balsam [place]-Castor Oil (VENELEX) OINT Special Instructions: Apply to bilateral buttocks & sacral area qshift for blanchable erythema.   calcium carbonate (TUMS - DOSED IN MG ELEMENTAL CALCIUM) 500 MG chewable tablet Chew 1 tablet by mouth 2 (two) times daily as needed for indigestion or heartburn.    diltiazem (CARDIZEM CD) 360 MG 24 hr capsule Take 1 capsule (360 mg total) by mouth daily.   ferrous sulfate 325 (65 FE) MG tablet Take 325 mg by mouth daily with breakfast. For Anemia and  Iron Deficiency   Flax Oil-Fish Oil-Borage Oil (FISH OIL-FLAX OIL-BORAGE OIL) CAPS Take 1 capsule by mouth daily.   hydrochlorothiazide (MICROZIDE) 12.5 MG capsule Take 12.5 mg by mouth daily.   ipratropium-albuterol (DUONEB) 0.5-2.5 (3) MG/3ML SOLN Take 3 mLs by nebulization every 6 (six) hours as needed.   isosorbide mononitrate (IMDUR) 30 MG 24 hr tablet Take 1 tablet (30 mg total) by mouth daily.   lisinopril (ZESTRIL) 40 MG tablet Take 1 tablet (40 mg total) by mouth daily.   loperamide (IMODIUM A-D) 2 MG tablet  amt: 2mg ; oral Special Instructions: after each stool do not exceed more than 4tabs in 24 hours, As Needed  Melatonin 10 MG TABS Take 10 mg by mouth at bedtime.   meloxicam (MOBIC) 7.5 MG tablet Take 7.5 mg by mouth daily. give for left hip pain   memantine (NAMENDA) 10 MG tablet Take 10 mg by mouth 2 (two) times daily.   methocarbamol (ROBAXIN) 500 MG tablet Take 500 mg by mouth daily in the afternoon.   metoprolol tartrate (LOPRESSOR) 25 MG tablet Take 1 tablet (25 mg total) by mouth 2 (two) times daily.   NON FORMULARY Diet: Regular   omeprazole (PRILOSEC) 20 MG capsule Take 1 capsule (20 mg total) by mouth daily before breakfast.   ondansetron (ZOFRAN-ODT) 8 MG disintegrating tablet Take 8 mg by mouth 3 (three) times daily before meals. As needed for vomiting and nausea   OXYGEN Inhale 2 L into the lungs continuous.   polyethylene glycol (MIRALAX / GLYCOLAX) 17 g packet Take 17 g by mouth daily.   potassium chloride SA (KLOR-CON) 20 MEQ tablet Take 40 mEq by mouth 3 (three) times daily.   rOPINIRole (REQUIP) 0.25 MG tablet Take 0.25 mg by mouth at bedtime.   senna-docusate (SENOKOT-S) 8.6-50 MG tablet Take 1 tablet by mouth at bedtime as needed for mild constipation.   simethicone (MYLICON) 166 MG chewable tablet Chew 125 mg by mouth in the morning, at noon, in the evening, and at bedtime.   spironolactone (ALDACTONE) 25 MG tablet Take 25 mg by mouth daily.   venlafaxine XR (EFFEXOR-XR) 150 MG 24 hr capsule Take 150 mg by mouth daily with breakfast.                Arther Abbott, MD

## 2021-03-19 NOTE — Pre-Procedure Instructions (Signed)
Spoke with Estill Bamberg, RN at [facility] about NPO except meds, arrival time and all other preop information. She verbalized understanding of all.

## 2021-03-20 ENCOUNTER — Emergency Department (HOSPITAL_COMMUNITY): Payer: Medicare Other

## 2021-03-20 ENCOUNTER — Inpatient Hospital Stay
Admission: RE | Admit: 2021-03-20 | Discharge: 2022-07-01 | Disposition: A | Discharge status: Skilled Nursing Facility | Payer: Medicare Other | Source: Ambulatory Visit | Attending: Internal Medicine | Admitting: Internal Medicine

## 2021-03-20 ENCOUNTER — Ambulatory Visit (HOSPITAL_COMMUNITY)
Admission: RE | Admit: 2021-03-20 | Discharge: 2021-03-20 | Disposition: A | Discharge status: Home / Self Care | Payer: Medicare Other | Source: Ambulatory Visit | Attending: Orthopedic Surgery | Admitting: Orthopedic Surgery

## 2021-03-20 ENCOUNTER — Encounter (HOSPITAL_COMMUNITY): Payer: Self-pay | Admitting: Orthopedic Surgery

## 2021-03-20 ENCOUNTER — Other Ambulatory Visit: Payer: Self-pay

## 2021-03-20 ENCOUNTER — Encounter (HOSPITAL_COMMUNITY): Payer: Self-pay

## 2021-03-20 ENCOUNTER — Ambulatory Visit (HOSPITAL_COMMUNITY): Payer: Medicare Other | Admitting: Certified Registered"

## 2021-03-20 ENCOUNTER — Other Ambulatory Visit: Payer: Self-pay | Admitting: Adult Health

## 2021-03-20 ENCOUNTER — Ambulatory Visit (HOSPITAL_COMMUNITY): Payer: Medicare Other

## 2021-03-20 ENCOUNTER — Emergency Department (HOSPITAL_COMMUNITY)
Admission: EM | Admit: 2021-03-20 | Discharge: 2021-03-20 | Disposition: A | Discharge status: Skilled Nursing Facility | Payer: Medicare Other | Source: Home / Self Care | Attending: Emergency Medicine | Admitting: Emergency Medicine

## 2021-03-20 ENCOUNTER — Encounter (HOSPITAL_COMMUNITY)
Admission: RE | Disposition: A | Discharge status: Home / Self Care | Payer: Self-pay | Source: Ambulatory Visit | Attending: Orthopedic Surgery

## 2021-03-20 DIAGNOSIS — T8484XA Pain due to internal orthopedic prosthetic devices, implants and grafts, initial encounter: Secondary | ICD-10-CM | POA: Diagnosis not present

## 2021-03-20 DIAGNOSIS — Z79899 Other long term (current) drug therapy: Secondary | ICD-10-CM | POA: Insufficient documentation

## 2021-03-20 DIAGNOSIS — K219 Gastro-esophageal reflux disease without esophagitis: Secondary | ICD-10-CM | POA: Insufficient documentation

## 2021-03-20 DIAGNOSIS — E041 Nontoxic single thyroid nodule: Secondary | ICD-10-CM

## 2021-03-20 DIAGNOSIS — I129 Hypertensive chronic kidney disease with stage 1 through stage 4 chronic kidney disease, or unspecified chronic kidney disease: Secondary | ICD-10-CM | POA: Insufficient documentation

## 2021-03-20 DIAGNOSIS — Z8546 Personal history of malignant neoplasm of prostate: Secondary | ICD-10-CM | POA: Insufficient documentation

## 2021-03-20 DIAGNOSIS — I4891 Unspecified atrial fibrillation: Secondary | ICD-10-CM | POA: Insufficient documentation

## 2021-03-20 DIAGNOSIS — R4182 Altered mental status, unspecified: Secondary | ICD-10-CM

## 2021-03-20 DIAGNOSIS — S72001S Fracture of unspecified part of neck of right femur, sequela: Secondary | ICD-10-CM | POA: Diagnosis not present

## 2021-03-20 DIAGNOSIS — G2581 Restless legs syndrome: Secondary | ICD-10-CM | POA: Insufficient documentation

## 2021-03-20 DIAGNOSIS — W19XXXA Unspecified fall, initial encounter: Secondary | ICD-10-CM | POA: Insufficient documentation

## 2021-03-20 DIAGNOSIS — Z9889 Other specified postprocedural states: Secondary | ICD-10-CM | POA: Diagnosis not present

## 2021-03-20 DIAGNOSIS — E0590 Thyrotoxicosis, unspecified without thyrotoxic crisis or storm: Secondary | ICD-10-CM

## 2021-03-20 DIAGNOSIS — F0390 Unspecified dementia without behavioral disturbance: Secondary | ICD-10-CM | POA: Insufficient documentation

## 2021-03-20 DIAGNOSIS — M25551 Pain in right hip: Secondary | ICD-10-CM | POA: Insufficient documentation

## 2021-03-20 DIAGNOSIS — Z7982 Long term (current) use of aspirin: Secondary | ICD-10-CM | POA: Insufficient documentation

## 2021-03-20 DIAGNOSIS — N183 Chronic kidney disease, stage 3 unspecified: Secondary | ICD-10-CM | POA: Insufficient documentation

## 2021-03-20 DIAGNOSIS — X58XXXA Exposure to other specified factors, initial encounter: Secondary | ICD-10-CM | POA: Insufficient documentation

## 2021-03-20 DIAGNOSIS — E7800 Pure hypercholesterolemia, unspecified: Secondary | ICD-10-CM | POA: Diagnosis not present

## 2021-03-20 DIAGNOSIS — I10 Essential (primary) hypertension: Secondary | ICD-10-CM | POA: Insufficient documentation

## 2021-03-20 DIAGNOSIS — S72001A Fracture of unspecified part of neck of right femur, initial encounter for closed fracture: Secondary | ICD-10-CM

## 2021-03-20 HISTORY — PX: HARDWARE REMOVAL: SHX979

## 2021-03-20 LAB — CBC WITH DIFFERENTIAL/PLATELET
Abs Immature Granulocytes: 0.16 10*3/uL — ABNORMAL HIGH (ref 0.00–0.07)
Basophils Absolute: 0 10*3/uL (ref 0.0–0.1)
Basophils Relative: 0 %
Eosinophils Absolute: 0 10*3/uL (ref 0.0–0.5)
Eosinophils Relative: 0 %
HCT: 44.4 % (ref 39.0–52.0)
Hemoglobin: 14.6 g/dL (ref 13.0–17.0)
Immature Granulocytes: 1 %
Lymphocytes Relative: 9 %
Lymphs Abs: 1.8 10*3/uL (ref 0.7–4.0)
MCH: 28.5 pg (ref 26.0–34.0)
MCHC: 32.9 g/dL (ref 30.0–36.0)
MCV: 86.7 fL (ref 80.0–100.0)
Monocytes Absolute: 0.9 10*3/uL (ref 0.1–1.0)
Monocytes Relative: 5 %
Neutro Abs: 17.3 10*3/uL — ABNORMAL HIGH (ref 1.7–7.7)
Neutrophils Relative %: 85 %
Platelets: 229 10*3/uL (ref 150–400)
RBC: 5.12 MIL/uL (ref 4.22–5.81)
RDW: 14.3 % (ref 11.5–15.5)
WBC: 20.3 10*3/uL — ABNORMAL HIGH (ref 4.0–10.5)
nRBC: 0 % (ref 0.0–0.2)

## 2021-03-20 LAB — BASIC METABOLIC PANEL
Anion gap: 10 (ref 5–15)
BUN: 26 mg/dL — ABNORMAL HIGH (ref 8–23)
CO2: 25 mmol/L (ref 22–32)
Calcium: 9.2 mg/dL (ref 8.9–10.3)
Chloride: 103 mmol/L (ref 98–111)
Creatinine, Ser: 1.44 mg/dL — ABNORMAL HIGH (ref 0.61–1.24)
GFR, Estimated: 46 mL/min — ABNORMAL LOW (ref >60)
Glucose, Bld: 201 mg/dL — ABNORMAL HIGH (ref 70–99)
Potassium: 4.2 mmol/L (ref 3.5–5.1)
Sodium: 138 mmol/L (ref 135–145)

## 2021-03-20 SURGERY — REMOVAL, HARDWARE
Anesthesia: General | Site: Hip | Laterality: Right

## 2021-03-20 MED ORDER — ACETAMINOPHEN-CODEINE #3 300-30 MG PO TABS: 1.0000 | ORAL_TABLET | Freq: Four times a day (QID) | ORAL | 0 refills | Status: DC | PRN

## 2021-03-20 MED ORDER — FENTANYL CITRATE (PF) 100 MCG/2ML IJ SOLN
INTRAMUSCULAR | Status: AC
  Filled 2021-03-20: qty 2

## 2021-03-20 MED ORDER — CHLORHEXIDINE GLUCONATE 0.12 % MT SOLN
15.0000 mL | Freq: Once | OROMUCOSAL | Status: AC
  Administered 2021-03-20: 15 mL via OROMUCOSAL

## 2021-03-20 MED ORDER — BUPIVACAINE-EPINEPHRINE (PF) 0.5% -1:200000 IJ SOLN
INTRAMUSCULAR | Status: AC
  Filled 2021-03-20: qty 30

## 2021-03-20 MED ORDER — CEFAZOLIN SODIUM-DEXTROSE 2-4 GM/100ML-% IV SOLN
2.0000 g | INTRAVENOUS | Status: AC
  Administered 2021-03-20: 2 g via INTRAVENOUS

## 2021-03-20 MED ORDER — HYDROMORPHONE HCL 1 MG/ML IJ SOLN: 0.2500 mg | INTRAMUSCULAR | Status: DC | PRN

## 2021-03-20 MED ORDER — PROPOFOL 10 MG/ML IV BOLUS
INTRAVENOUS | Status: AC
  Filled 2021-03-20: qty 40

## 2021-03-20 MED ORDER — LOPERAMIDE HCL 2 MG PO CAPS
2.0000 mg | ORAL_CAPSULE | Freq: Once | ORAL | Status: AC
  Administered 2021-03-20: 2 mg via ORAL
  Filled 2021-03-20: qty 1

## 2021-03-20 MED ORDER — SODIUM CHLORIDE 0.9 % IR SOLN
Status: DC | PRN
  Administered 2021-03-20: 1000 mL

## 2021-03-20 MED ORDER — ONDANSETRON HCL 4 MG/2ML IJ SOLN: 4.0000 mg | Freq: Once | INTRAMUSCULAR | Status: DC | PRN

## 2021-03-20 MED ORDER — LIDOCAINE 2% (20 MG/ML) 5 ML SYRINGE
INTRAMUSCULAR | Status: DC | PRN
  Administered 2021-03-20: 80 mg via INTRAVENOUS

## 2021-03-20 MED ORDER — EPHEDRINE SULFATE 50 MG/ML IJ SOLN
INTRAMUSCULAR | Status: DC | PRN
  Administered 2021-03-20: 10 mg via INTRAVENOUS
  Administered 2021-03-20: 5 mg via INTRAVENOUS
  Administered 2021-03-20: 10 mg via INTRAVENOUS

## 2021-03-20 MED ORDER — HYDROCODONE-ACETAMINOPHEN 5-325 MG PO TABS
1.0000 | ORAL_TABLET | Freq: Once | ORAL | Status: AC
  Administered 2021-03-20: 1 via ORAL
  Filled 2021-03-20: qty 1

## 2021-03-20 MED ORDER — EPHEDRINE 5 MG/ML INJ
INTRAVENOUS | Status: AC
  Filled 2021-03-20: qty 5

## 2021-03-20 MED ORDER — FENTANYL CITRATE (PF) 100 MCG/2ML IJ SOLN
INTRAMUSCULAR | Status: DC | PRN
  Administered 2021-03-20 (×2): 50 ug via INTRAVENOUS
  Administered 2021-03-20: 25 ug via INTRAVENOUS

## 2021-03-20 MED ORDER — CEFAZOLIN SODIUM-DEXTROSE 2-4 GM/100ML-% IV SOLN
INTRAVENOUS | Status: AC
  Filled 2021-03-20: qty 100

## 2021-03-20 MED ORDER — LACTATED RINGERS IV SOLN
INTRAVENOUS | Status: DC
  Administered 2021-03-20: 1000 mL via INTRAVENOUS

## 2021-03-20 MED ORDER — PROPOFOL 10 MG/ML IV BOLUS
INTRAVENOUS | Status: DC | PRN
  Administered 2021-03-20: 30 mg via INTRAVENOUS
  Administered 2021-03-20: 100 mg via INTRAVENOUS

## 2021-03-20 MED ORDER — GLYCOPYRROLATE PF 0.2 MG/ML IJ SOSY
PREFILLED_SYRINGE | INTRAMUSCULAR | Status: AC
  Filled 2021-03-20: qty 1

## 2021-03-20 MED ORDER — BUPIVACAINE-EPINEPHRINE 0.5% -1:200000 IJ SOLN
INTRAMUSCULAR | Status: DC | PRN
  Administered 2021-03-20: 30 mL

## 2021-03-20 MED ORDER — GLYCOPYRROLATE PF 0.2 MG/ML IJ SOSY
PREFILLED_SYRINGE | INTRAMUSCULAR | Status: DC | PRN
  Administered 2021-03-20 (×2): .1 mg via INTRAVENOUS

## 2021-03-20 MED ORDER — PHENYLEPHRINE HCL-NACL 20-0.9 MG/250ML-% IV SOLN
INTRAVENOUS | Status: DC | PRN
  Administered 2021-03-20: 50 ug/min via INTRAVENOUS

## 2021-03-20 MED ORDER — SODIUM CHLORIDE 0.9 % IV BOLUS
500.0000 mL | Freq: Once | INTRAVENOUS | Status: AC
  Administered 2021-03-20: 500 mL via INTRAVENOUS

## 2021-03-20 MED ORDER — ORAL CARE MOUTH RINSE: 15.0000 mL | Freq: Once | OROMUCOSAL | Status: AC

## 2021-03-20 MED ORDER — PHENYLEPHRINE 40 MCG/ML (10ML) SYRINGE FOR IV PUSH (FOR BLOOD PRESSURE SUPPORT)
PREFILLED_SYRINGE | INTRAVENOUS | Status: DC | PRN
  Administered 2021-03-20 (×2): 80 ug via INTRAVENOUS

## 2021-03-20 MED ORDER — ONDANSETRON HCL 4 MG/2ML IJ SOLN
INTRAMUSCULAR | Status: AC
  Filled 2021-03-20: qty 4

## 2021-03-20 SURGICAL SUPPLY — 35 items
APL PRP STRL LF DISP 70% ISPRP (MISCELLANEOUS) ×1
APL SKNCLS STERI-STRIP NONHPOA (GAUZE/BANDAGES/DRESSINGS) ×1
BENZOIN TINCTURE PRP APPL 2/3 (GAUZE/BANDAGES/DRESSINGS) ×2 IMPLANT
BLADE SURG SZ10 CARB STEEL (BLADE) ×2 IMPLANT
CHLORAPREP W/TINT 26 (MISCELLANEOUS) ×2 IMPLANT
CLOTH BEACON ORANGE TIMEOUT ST (SAFETY) ×2 IMPLANT
COVER LIGHT HANDLE STERIS (MISCELLANEOUS) ×4 IMPLANT
DRAPE OEC MINIVIEW 54X84 (DRAPES) ×2 IMPLANT
DRSG MEPILEX BORDER 4X12 (GAUZE/BANDAGES/DRESSINGS) ×2 IMPLANT
ELECT REM PT RETURN 9FT ADLT (ELECTROSURGICAL) ×2
ELECTRODE REM PT RTRN 9FT ADLT (ELECTROSURGICAL) ×1 IMPLANT
GLOVE SS N UNI LF 8.5 STRL (GLOVE) ×2 IMPLANT
GLOVE SURG POLYISO LF SZ8 (GLOVE) ×2 IMPLANT
GLOVE SURG UNDER POLY LF SZ7 (GLOVE) ×4 IMPLANT
GOWN STRL REUS W/TWL LRG LVL3 (GOWN DISPOSABLE) ×2 IMPLANT
GOWN STRL REUS W/TWL XL LVL3 (GOWN DISPOSABLE) ×2 IMPLANT
INST SET MINOR BONE (KITS) ×2 IMPLANT
KIT TURNOVER KIT A (KITS) ×2 IMPLANT
MANIFOLD NEPTUNE II (INSTRUMENTS) ×2 IMPLANT
NEEDLE HYPO 18GX1.5 BLUNT FILL (NEEDLE) ×2 IMPLANT
NEEDLE HYPO 21X1.5 SAFETY (NEEDLE) ×2 IMPLANT
NS IRRIG 1000ML POUR BTL (IV SOLUTION) ×2 IMPLANT
PACK BASIC LIMB (CUSTOM PROCEDURE TRAY) ×2 IMPLANT
PAD ARMBOARD 7.5X6 YLW CONV (MISCELLANEOUS) ×2 IMPLANT
SET BASIN LINEN APH (SET/KITS/TRAYS/PACK) ×2 IMPLANT
SPONGE T-LAP 18X18 ~~LOC~~+RFID (SPONGE) ×4 IMPLANT
STAPLER VISISTAT 35W (STAPLE) ×2 IMPLANT
STRIP CLOSURE SKIN 1/2X4 (GAUZE/BANDAGES/DRESSINGS) ×2 IMPLANT
SUT MNCRL 0 VIOLET CTX 36 (SUTURE) ×1 IMPLANT
SUT MON AB 0 CT1 (SUTURE) ×2 IMPLANT
SUT MON AB 2-0 CT1 36 (SUTURE) ×2 IMPLANT
SUT MONOCRYL 0 CTX 36 (SUTURE) ×2
SYR 20ML LL LF (SYRINGE) ×2 IMPLANT
SYR 30ML LL (SYRINGE) ×2 IMPLANT
SYR BULB IRRIG 60ML STRL (SYRINGE) ×4 IMPLANT

## 2021-03-20 NOTE — ED Triage Notes (Signed)
Pt brought from [place] after a fall. Removal of right hip hardware earlier today. Dressing is clean, dry and intact. Blood sugar 167 per EMS.

## 2021-03-20 NOTE — Progress Notes (Signed)
Report given by Santiago Glad, RN to [facility] center nurse.

## 2021-03-20 NOTE — Op Note (Signed)
03/20/2021  1:39 PM  PATIENT:  Jeffrey Sherman  87 y.o. male  PRE-OPERATIVE DIAGNOSIS:  painful retained hardware right hip  POST-OPERATIVE DIAGNOSIS:  painful retained hardware right hip  PROCEDURE:  Procedure(s): HARDWARE REMOVAL right hip (Right)  Findings at surgery: Healed femoral neck fracture 4 screws with washers   SURGEON:  Surgeon(s) and Role:    Carole Civil, MD - Primary  Mr. Pucillo was seen in preop I checked his right hip he was noted to have normal skin envelope and labs were good and he was in stable condition and cleared for surgery.  Chart review was completed x-rays were reviewed.  I marked his right hip after confirming it as the surgical site.  He was taken to the operating room placed on the fracture table after intubation with an LMA.  He was placed on the fracture table in the traction device the left leg was abducted in a boot  The right leg was then prepped and draped up to the iliac crest down to the ankle.  Timeout was completed.  The previous incision was used.  10 blade was used to open the incision cut through the subcutaneous tissue down to the fascia.  Electrocautery was then used to open the fascia superficial and deep and then key elevator was used to expose the screws which had a large bursal sac over them this was excised.  The screws were removed by first turning them to the right and then backing the screw out holding onto the washer.  Each screw was removed in this fashion.  Irrigation was done.  Soft tissue bleeding was controlled with cautery.  After this a second irrigation was performed.  The bleeding at this point was coming from the screw holes and this was confirmed by placing my fingers over the screw holes and the bleeding stopped  Fascial layers were closed with #1 Braylon 0 Monocryl in several layers followed by 2-0 Monocryl in the subcuticular layer.  Benzoin and Steri-Strips followed.  Sterile dressing was applied  Patient  was extubated and taken to recovery room in stable condition  The postoperative plan is for him to be protected weightbearing as tolerated with a walker for the next 4 weeks  PHYSICIAN ASSISTANT:   ASSISTANTS: none  ANESTHESIA:   general  EBL:  20 mL   BLOOD ADMINISTERED:none  DRAINS: none   LOCAL MEDICATIONS USED:  MARCAINE     SPECIMEN:  No Specimen  DISPOSITION OF SPECIMEN:  N/A  COUNTS:  YES  TOURNIQUET:  * No tourniquets in log *  DICTATION: .Dragon Dictation  PLAN OF CARE: Discharge to home after PACU  PATIENT DISPOSITION:  PACU - hemodynamically stable.   Delay start of Pharmacological VTE agent (>24hrs) due to surgical blood loss or risk of bleeding: not applicable

## 2021-03-20 NOTE — Anesthesia Postprocedure Evaluation (Signed)
Anesthesia Post Note  Patient: Jeffrey Sherman  Procedure(s) Performed: HARDWARE REMOVAL right hip (Right: Hip)  Patient location during evaluation: PACU Anesthesia Type: General Level of consciousness: awake and alert and oriented Pain management: pain level controlled Vital Signs Assessment: post-procedure vital signs reviewed and stable Respiratory status: spontaneous breathing and respiratory function stable Cardiovascular status: blood pressure returned to baseline and stable Postop Assessment: no apparent nausea or vomiting Anesthetic complications: no   No notable events documented.   Last Vitals:  Vitals:   03/20/21 1430 03/20/21 1443  BP: (!) 180/74 (!) 180/74  Pulse: 62 64  Resp: 16 16  Temp:    SpO2: 99% 98%    Last Pain:  Vitals:   03/20/21 1443  TempSrc:   PainSc: 0-No pain                 Jade Burkard C Conrad Zajkowski

## 2021-03-20 NOTE — Anesthesia Preprocedure Evaluation (Addendum)
Anesthesia Evaluation  Patient identified by MRN, date of birth, ID band Patient awake    Reviewed: Allergy & Precautions, NPO status , Patient's Chart, lab work & pertinent test results, reviewed documented beta blocker date and time   History of Anesthesia Complications Negative for: history of anesthetic complications  Airway Mallampati: II  TM Distance: >3 FB Neck ROM: Full    Dental  (+) Dental Advisory Given, Edentulous Upper, Missing, Poor Dentition   Pulmonary shortness of breath, with exertion and at rest,  Room air saturations 90 to 91. patient can't take deep breaths because of abdominal pain.   Pulmonary exam normal breath sounds clear to auscultation       Cardiovascular Exercise Tolerance: Poor hypertension, Pt. on medications and Pt. on home beta blockers + dysrhythmias Atrial Fibrillation + Valvular Problems/Murmurs  Rhythm:Irregular Rate:Bradycardia - Systolic murmurs, - Diastolic murmurs, - Friction Rub, - Carotid Bruit, - Peripheral Edema and - Systolic Click    Neuro/Psych PSYCHIATRIC DISORDERS Anxiety Depression Dementia  Neuromuscular disease (RLS)    GI/Hepatic PUD, GERD  Medicated and Controlled,  Endo/Other    Renal/GU Renal InsufficiencyRenal disease     Musculoskeletal negative musculoskeletal ROS (+)   Abdominal   Peds  Hematology  (+) anemia ,   Anesthesia Other Findings   Reproductive/Obstetrics negative OB ROS                             Anesthesia Physical  Anesthesia Plan  ASA: 3  Anesthesia Plan: General   Post-op Pain Management:    Induction: Intravenous  PONV Risk Score and Plan: 4 or greater and Ondansetron and Dexamethasone  Airway Management Planned: LMA  Additional Equipment:   Intra-op Plan:   Post-operative Plan: Extubation in OR  Informed Consent: I have reviewed the patients History and Physical, chart, labs and discussed the  procedure including the risks, benefits and alternatives for the proposed anesthesia with the patient or authorized representative who has indicated his/her understanding and acceptance.     Dental advisory given  Plan Discussed with: CRNA and Surgeon  Anesthesia Plan Comments:        Anesthesia Quick Evaluation

## 2021-03-20 NOTE — Interval H&P Note (Signed)
History and Physical Interval Note:  03/20/2021 11:44 AM  Jeffrey Sherman  has presented today for surgery, with the diagnosis of painful retained hardware right hip.  The various methods of treatment have been discussed with the patient and family. After consideration of risks, benefits and other options for treatment, the patient has consented to  Procedure(s) with comments: HARDWARE REMOVAL right hip (Right) - spoke with [facility] and advised that surgery has been moved, also spoke with pt's daughter in law and advised her of new date as a surgical intervention.  The patient's history has been reviewed, patient examined, no change in status, stable for surgery.  I have reviewed the patient's chart and labs.  Questions were answered to the patient's satisfaction.     Arther Abbott

## 2021-03-20 NOTE — Interval H&P Note (Signed)
History and Physical Interval Note:  03/20/2021 11:45 AM  Jeffrey Sherman  has presented today for surgery, with the diagnosis of painful retained hardware right hip.  The various methods of treatment have been discussed with the patient and family. After consideration of risks, benefits and other options for treatment, the patient has consented to  Procedure(s) with comments: HARDWARE REMOVAL right hip (Right) - spoke with [facility] and advised that surgery has been moved, also spoke with pt's daughter in law and advised her of new date as a surgical intervention.  The patient's history has been reviewed, patient examined, no change in status, stable for surgery.  I have reviewed the patient's chart and labs.  Questions were answered to the patient's satisfaction.     Arther Abbott

## 2021-03-20 NOTE — ED Provider Notes (Signed)
[facility] EMERGENCY DEPARTMENT Provider Note   CSN: [id number] Arrival date & time: 03/20/21  1733     History No chief complaint on file.   Jeffrey Sherman is a 87 y.o. male.  Patient has surgery on his right hip today to remove hardware.  He was in a nursing home when he fell today.  He complained of some pain in that hip  The history is provided by the patient. No language interpreter was used.  Fall This is a new problem. The current episode started less than 1 hour ago. The problem occurs rarely. The problem has been resolved. Pertinent negatives include no chest pain, no abdominal pain and no headaches. Exacerbated by: Palpation of the right hip. He has tried nothing for the symptoms.      Past Medical History:  Diagnosis Date   Anxiety    Cancer [facility])    prostate   Chronic chest wall pain    Essential hypertension, benign 06/20/2016   GAD (generalized anxiety disorder)    GERD (gastroesophageal reflux disease)    H/O echocardiogram 2016   normal   Heart murmur    "leaking heart valve"   High cholesterol 06/20/2016   HOH (hard of hearing)    left   Hx of falling    Interstitial pulmonary disease (HCC)    Lower GI bleed    MDD (major depressive disorder)    Murmur, cardiac    Poor historian    PUD (peptic ulcer disease)    RLS (restless legs syndrome)    Shortness of breath    Wrist fracture 12/16/2011    Patient Active Problem List   Diagnosis Date Noted   Benign hypertension with chronic kidney disease, stage III ([place]) 02/01/2021   CKD (chronic kidney disease), stage III ([place]) 09/07/2020   Medicare annual wellness visit, subsequent 08/08/2020   Chest pain 06/16/2020   Aortic atherosclerosis ([place]) 06/15/2020   Biliary dyskinesia    Hyperkalemia 06/09/2020   Chronic left hip pain    Restless leg syndrome 04/21/2020   Protein-calorie malnutrition, severe ([place]) 03/22/2020   Dementia without behavioral disturbance ([place]) 03/22/2020   Iron deficiency  anemia due to dietary causes 03/22/2020   Prostate cancer ([place]) 03/15/2020   Overactive bladder 03/15/2020   Dyslipidemia 03/15/2020   Major depression, recurrent, chronic ([place]) 03/15/2020   Interstitial lung disease ([place]) 03/15/2020   Closed right hip fracture, sequela 03/09/2020   Hypokalemia 11/06/2017   Renal insufficiency 11/06/2017   Essential hypertension 06/20/2016   Gastroesophageal reflux disease without esophagitis 06/20/2016   Chronic constipation 12/16/2011    Past Surgical History:  Procedure Laterality Date   CHOLECYSTECTOMY N/A 06/28/2020   Procedure: LAPAROSCOPIC CHOLECYSTECTOMY;  Surgeon: [name], MD;  Location: [facility] ORS;  Service: General;  Laterality: N/A;  pt in [place]  2005   Rehman: Sigmoid colon diverticulosis, moderate sized external hemorrhoids. A few focal areas of mucosal erythema felt to be nonspecific.   ESOPHAGOGASTRODUODENOSCOPY     ESOPHAGOGASTRODUODENOSCOPY N/A 07/18/2016   Rehman: normal exam except duodenal scar. h.pylori serologies negative   FLEXIBLE SIGMOIDOSCOPY N/A 06/11/2020   Procedure: FLEXIBLE SIGMOIDOSCOPY;  Surgeon: Eloise Harman, DO;  Location: [facility] ENDO SUITE;  Service: Endoscopy;  Laterality: N/A;   HIP PINNING,CANNULATED Right 03/10/2020   Procedure: INTERNAL FIXATION RIGHT HIP;  Surgeon: Carole Civil, MD;  Location: [facility] ORS;  Service: Orthopedics;  Laterality: Right;   ORIF WRIST FRACTURE  12/19/2011   Procedure: OPEN REDUCTION  INTERNAL FIXATION (ORIF) WRIST FRACTURE;  Surgeon: Carole Civil, MD;  Location: [facility];  Service: Orthopedics;  Laterality: Right;   prostate cancer     Diagnosed in the 90s   TONSILLECTOMY         Family History  Problem Relation Age of Onset   Cancer Other    Heart attack Mother    Cancer Brother    Heart attack Brother    Heart disease Sister        by pass    Social History   Tobacco Use   Smoking status: Never   Smokeless tobacco: Never  Vaping Use    Vaping Use: Never used  Substance Use Topics   Alcohol use: No   Drug use: No    Home Medications Prior to Admission medications   Medication Sig Start Date End Date Taking? Authorizing Provider  acetaminophen (TYLENOL) 500 MG tablet Take 500 mg by mouth every 6 (six) hours. Max 3000mg  in 24 hour period 03/20/20   [provider]  acetaminophen-codeine (TYLENOL #3) 300-30 MG tablet Take 1 tablet by mouth every 6 (six) hours as needed for moderate pain. 03/20/21   Gerlene Fee, NP  alfuzosin (UROXATRAL) 10 MG 24 hr tablet Take 10 mg by mouth daily with breakfast.    [provider]  antiseptic oral rinse (BIOTENE) LIQD 10 mLs by Mouth Rinse route in the morning and at bedtime.    [provider]  Ascorbic Acid (VITAMIN C) 1000 MG tablet Take 1,000 mg by mouth daily.    [provider]  aspirin 81 MG chewable tablet Chew 1 tablet (81 mg total) by mouth daily with breakfast. 06/29/20   Roxan Hockey, MD  Roseanne Kaufman [employer] [facility]) OINT Apply 1 application topically every 8 (eight) hours as needed (Apply to bilateral buttocks & sacral area qshift for blanchable erythema.). 03/15/20   [provider]  calcium carbonate (TUMS - DOSED IN MG ELEMENTAL CALCIUM) 500 MG chewable tablet Chew 1 tablet by mouth 2 (two) times daily as needed for indigestion or heartburn.     [provider]  diltiazem (CARDIZEM CD) 360 MG 24 hr capsule Take 1 capsule (360 mg total) by mouth daily. 06/29/20   Roxan Hockey, MD  ferrous sulfate 325 (65 FE) MG tablet Take 325 mg by mouth daily with breakfast. For Anemia and  Iron Deficiency 03/17/20   [provider]  Flax Oil-Fish Oil-Borage Oil (FISH OIL-FLAX OIL-BORAGE OIL) CAPS Take 1 capsule by mouth daily.    [provider]  hydrochlorothiazide (MICROZIDE) 12.5 MG capsule Take 12.5 mg by mouth daily.    [provider]  ipratropium-albuterol (DUONEB) 0.5-2.5 (3) MG/3ML SOLN Take 3  mLs by nebulization every 6 (six) hours as needed (shortness of breath or wheezing).    [provider]  isosorbide mononitrate (IMDUR) 30 MG 24 hr tablet Take 1 tablet (30 mg total) by mouth daily. 06/29/20 06/29/21  Roxan Hockey, MD  lisinopril (ZESTRIL) 40 MG tablet Take 1 tablet (40 mg total) by mouth daily. 06/29/20   Roxan Hockey, MD  loperamide (IMODIUM A-D) 2 MG tablet Take 2 mg by mouth as needed (after each stool. (do not exceed more than 4 tabs in 24 hours)). 06/01/20   [provider]  Melatonin 10 MG TABS Take 10 mg by mouth at bedtime.    [provider]  meloxicam (MOBIC) 7.5 MG tablet Take 7.5 mg by mouth daily. give for left hip pain  [provider]  memantine (NAMENDA) 10 MG tablet Take 10 mg by mouth 2 (two) times daily.    [provider]  methocarbamol (ROBAXIN) 500 MG tablet Take 500 mg by mouth daily.    [provider]  metoprolol tartrate (LOPRESSOR) 25 MG tablet Take 1 tablet (25 mg total) by mouth 2 (two) times daily. 06/01/20   Johnson, Clanford L, MD  omeprazole (PRILOSEC) 20 MG capsule Take 1 capsule (20 mg total) by mouth daily before breakfast. 03/14/20   Johnson, Clanford L, MD  ondansetron (ZOFRAN-ODT) 8 MG disintegrating tablet Take 8 mg by mouth 3 (three) times daily as needed for vomiting or nausea (before each meal as needed). 06/07/20   [provider]  polyethylene glycol (MIRALAX / GLYCOLAX) 17 g packet Take 17 g by mouth daily. 06/01/20   [provider]  potassium chloride SA (KLOR-CON) 20 MEQ tablet Take 40 mEq by mouth 3 (three) times daily.    [provider]  rOPINIRole (REQUIP) 0.25 MG tablet Take 0.25 mg by mouth at bedtime. 04/21/20   [provider]  senna-docusate (SENOKOT-S) 8.6-50 MG tablet Take 1 tablet by mouth at bedtime as needed for mild constipation. 06/14/20   [provider]  simethicone (MYLICON) 254 MG chewable tablet Chew 125 mg by  mouth 3 (three) times daily as needed ("increased gas").    [provider]  spironolactone (ALDACTONE) 25 MG tablet Take 25 mg by mouth daily.    [provider]  venlafaxine XR (EFFEXOR-XR) 150 MG 24 hr capsule Take 150 mg by mouth daily with breakfast.    [provider]    Allergies    Hctz [hydrochlorothiazide]  Review of Systems   Review of Systems  Constitutional:  Negative for appetite change and fatigue.  HENT:  Negative for congestion, ear discharge and sinus pressure.   Eyes:  Negative for discharge.  Respiratory:  Negative for cough.   Cardiovascular:  Negative for chest pain.  Gastrointestinal:  Negative for abdominal pain and diarrhea.  Genitourinary:  Negative for frequency and hematuria.  Musculoskeletal:  Negative for back pain.       Pain right hip  Skin:  Negative for rash.  Neurological:  Negative for seizures and headaches.  Psychiatric/Behavioral:  Negative for hallucinations.    Physical Exam Updated Vital Signs BP (!) 142/71   Pulse 64   Temp 97.8 F (36.6 C) (Oral)   Resp 18   SpO2 97%   Physical Exam Vitals and nursing note reviewed.  Constitutional:      Appearance: He is well-developed.  HENT:     Head: Normocephalic.     Nose: Nose normal.  Eyes:     General: No scleral icterus.    Conjunctiva/sclera: Conjunctivae normal.  Neck:     Thyroid: No thyromegaly.  Cardiovascular:     Rate and Rhythm: Normal rate and regular rhythm.     Heart sounds: No murmur heard.   No friction rub. No gallop.  Pulmonary:     Breath sounds: No stridor. No wheezing or rales.  Chest:     Chest wall: No tenderness.  Abdominal:     General: There is no distension.     Tenderness: There is no abdominal tenderness. There is no rebound.  Musculoskeletal:        General: Normal range of motion.     Cervical back: Neck supple.     Comments: Tenderness right hip.  Incision appears intact  Lymphadenopathy:  Cervical: No cervical  adenopathy.  Skin:    Findings: No erythema or rash.  Neurological:     Mental Status: He is alert and oriented to person, place, and time.     Motor: No abnormal muscle tone.     Coordination: Coordination normal.  Psychiatric:        Behavior: Behavior normal.    ED Results / Procedures / Treatments   Labs (all labs ordered are listed, but only abnormal results are displayed) Labs Reviewed  CBC WITH DIFFERENTIAL/PLATELET - Abnormal; Notable for the following components:      Result Value   WBC 20.3 (*)    Neutro Abs 17.3 (*)    Abs Immature Granulocytes 0.16 (*)    All other components within normal limits  BASIC METABOLIC PANEL - Abnormal; Notable for the following components:   Glucose, Bld 201 (*)    BUN 26 (*)    Creatinine, Ser 1.44 (*)    GFR, Estimated 46 (*)    All other components within normal limits    EKG None  Radiology DG C-Arm 1-60 Min  Result Date: 03/20/2021 CLINICAL DATA:  Provided history: Status post hardware removal. Removal of hardware from right hip. Provided FLUOROSCOPY TIME:  1 second. EXAM: OPERATIVE right HIP (WITH PELVIS IF PERFORMED) 1 VIEW TECHNIQUE: Fluoroscopic spot image submitted for interpretation post-operatively. COMPARISON:  Radiographs of the right hip and pelvis 01/10/2021. FINDINGS: A single fluoroscopic image of the right hip is submitted. Previously demonstrated screws within the proximal right femur are no longer present. Overlying retractors. Correlate with the operative history. IMPRESSION: Single intraoperative fluoroscopic image of the right hip from hardware removal, as described. Electronically Signed   By: Kellie Simmering D.O.   On: 03/20/2021 13:43   DG HIP OPERATIVE UNILAT WITH PELVIS RIGHT  Result Date: 03/20/2021 CLINICAL DATA:  Provided history: Status post hardware removal. Removal of hardware from right hip. Provided FLUOROSCOPY TIME:  1 second. EXAM: OPERATIVE right HIP (WITH PELVIS IF PERFORMED) 1 VIEW TECHNIQUE:  Fluoroscopic spot image submitted for interpretation post-operatively. COMPARISON:  Radiographs of the right hip and pelvis 01/10/2021. FINDINGS: A single fluoroscopic image of the right hip is submitted. Previously demonstrated screws within the proximal right femur are no longer present. Overlying retractors. Correlate with the operative history. IMPRESSION: Single intraoperative fluoroscopic image of the right hip from hardware removal, as described. Electronically Signed   By: Kellie Simmering D.O.   On: 03/20/2021 13:43   DG Hip Unilat W or Wo Pelvis 2-3 Views Right  Result Date: 03/20/2021 CLINICAL DATA:  Fall, right hip pain EXAM: DG HIP (WITH OR WITHOUT PELVIS) 2-3V RIGHT COMPARISON:  01/10/2021 FINDINGS: Previously noted partially-threaded screws within the right femoral neck have been removed. Healed right subcapital femoral neck fracture with fracture fragments in anatomic alignment. No acute fracture or dislocation. Hip joint spaces are preserved. Vascular calcifications are seen within the medial thighs bilaterally. IMPRESSION: No acute fracture or dislocation. Electronically Signed   By: Fidela Salisbury M.D.   On: 03/20/2021 19:16    Procedures Procedures   Medications Ordered in ED Medications  sodium chloride 0.9 % bolus 500 mL (0 mLs Intravenous Stopped 03/20/21 1937)  HYDROcodone-acetaminophen (NORCO/VICODIN) 5-325 MG per tablet 1 tablet (1 tablet Oral Given 03/20/21 1958)    ED Course  I have reviewed the triage vital signs and the nursing notes.  Pertinent labs & imaging results that were available during my care of the patient were reviewed by me and considered  in my medical decision making (see chart for details).    MDM Rules/Calculators/A&P                          Contusion to right hip.  X-ray unremarkable.  He will be discharged back home to the nursing home and follow-up with Ortho Final Clinical Impression(s) / ED Diagnoses Final diagnoses:  Fall, initial encounter     Rx / DC Orders ED Discharge Orders     None        Stalin Ferguson, MD 03/27/21 1721

## 2021-03-20 NOTE — Transfer of Care (Signed)
Immediate Anesthesia Transfer of Care Note  Patient: Jeffrey Sherman  Procedure(s) Performed: HARDWARE REMOVAL right hip (Right: Hip)  Patient Location: PACU  Anesthesia Type:General  Level of Consciousness: drowsy  Airway & Oxygen Therapy: Patient Spontanous Breathing and Patient connected to face mask oxygen  Post-op Assessment: Report given to RN and Post -op Vital signs reviewed and stable  Post vital signs: Reviewed and stable  Last Vitals:  Vitals Value Taken Time  BP 128/58 03/20/21 1341  Temp    Pulse 53 03/20/21 1342  Resp 13 03/20/21 1342  SpO2 100 % 03/20/21 1342  Vitals shown include unvalidated device data.  Last Pain:  Vitals:   03/20/21 1040  TempSrc: Oral  PainSc: 0-No pain      Patients Stated Pain Goal: 8 (67/20/94 7096)  Complications: No notable events documented.

## 2021-03-20 NOTE — Anesthesia Procedure Notes (Signed)
Procedure Name: LMA Insertion Date/Time: 03/20/2021 12:26 PM Performed by: Orlie Dakin, CRNA Pre-anesthesia Checklist: Patient identified, Emergency Drugs available, Suction available and Patient being monitored Patient Re-evaluated:Patient Re-evaluated prior to induction Oxygen Delivery Method: Circle system utilized Preoxygenation: Pre-oxygenation with 100% oxygen Induction Type: IV induction LMA: LMA inserted LMA Size: 4.0 Tube type: Oral Number of attempts: 1 Placement Confirmation: positive ETCO2 Tube secured with: Tape Dental Injury: Teeth and Oropharynx as per pre-operative assessment

## 2021-03-20 NOTE — ED Notes (Signed)
[facility] staff here to transport pt

## 2021-03-20 NOTE — Brief Op Note (Signed)
03/20/2021  1:33 PM  PATIENT:  Jeffrey Sherman  87 y.o. male  PRE-OPERATIVE DIAGNOSIS:  painful retained hardware right hip  POST-OPERATIVE DIAGNOSIS:  painful retained hardware right hip  PROCEDURE:  Procedure(s): HARDWARE REMOVAL right hip (Right)  Findings at surgery: Healed femoral neck fracture 4 screws with washers   SURGEON:  Surgeon(s) and Role:    Carole Civil, MD - Primary  Jeffrey Sherman was seen in preop I checked his right hip he was noted to have normal skin envelope and labs were good and he was in stable condition and cleared for surgery.  Chart review was completed x-rays were reviewed.  I marked his right hip after confirming it as the surgical site.  He was taken to the operating room placed on the fracture table after intubation with an LMA.  He was placed on the fracture table in the traction device the left leg was abducted in a boot  The right leg was then prepped and draped up to the iliac crest down to the ankle.  Timeout was completed.  The previous incision was used.  10 blade was used to open the incision cut through the subcutaneous tissue down to the fascia.  Electrocautery was then used to open the fascia superficial and deep and then key elevator was used to expose the screws which had a large bursal sac over them this was excised.  The screws were removed by first turning them to the right and then backing the screw out holding onto the washer.  Each screw was removed in this fashion.  Irrigation was done.  Soft tissue bleeding was controlled with cautery.  After this a second irrigation was performed.  The bleeding at this point was coming from the screw holes and this was confirmed by placing my fingers over the screw holes and the bleeding stopped  Fascial layers were closed with #1 Braylon 0 Monocryl in several layers followed by 2-0 Monocryl in the subcuticular layer.  Benzoin and Steri-Strips followed.  Sterile dressing was applied  Patient  was extubated and taken to recovery room in stable condition  The postoperative plan is for him to be protected weightbearing as tolerated with a walker for the next 4 weeks  PHYSICIAN ASSISTANT:   ASSISTANTS: none  ANESTHESIA:   general  EBL:  20 mL   BLOOD ADMINISTERED:none  DRAINS: none   LOCAL MEDICATIONS USED:  MARCAINE     SPECIMEN:  No Specimen  DISPOSITION OF SPECIMEN:  N/A  COUNTS:  YES  TOURNIQUET:  * No tourniquets in log *  DICTATION: .Dragon Dictation  PLAN OF CARE: Discharge to home after PACU  PATIENT DISPOSITION:  PACU - hemodynamically stable.   Delay start of Pharmacological VTE agent (>24hrs) due to surgical blood loss or risk of bleeding: not applicable

## 2021-03-20 NOTE — Discharge Instructions (Addendum)
Follow-up with the orthopedic as planned earlier today

## 2021-03-21 ENCOUNTER — Non-Acute Institutional Stay (SKILLED_NURSING_FACILITY): Payer: Medicare Other | Admitting: Adult Health

## 2021-03-21 ENCOUNTER — Encounter: Payer: Self-pay | Admitting: Adult Health

## 2021-03-21 DIAGNOSIS — N183 Chronic kidney disease, stage 3 unspecified: Secondary | ICD-10-CM

## 2021-03-21 DIAGNOSIS — S72001S Fracture of unspecified part of neck of right femur, sequela: Secondary | ICD-10-CM | POA: Diagnosis not present

## 2021-03-21 DIAGNOSIS — I129 Hypertensive chronic kidney disease with stage 1 through stage 4 chronic kidney disease, or unspecified chronic kidney disease: Secondary | ICD-10-CM

## 2021-03-21 NOTE — Progress Notes (Signed)
Location:  [facility] Room Number: 156-D Place of Service:  SNF (31)   CODE STATUS: DNR  Allergies  Allergen Reactions   Hctz [Hydrochlorothiazide]     hypokalemia    Chief Complaint  Patient presents with   Acute Visit    Surgery follow-up    HPI:  He had a right femoral neck fracture in Oct 2021; on 03-10-20 he had left hip IM nailing done. He developed right hip pain. He was seen by orthopedics; evaluated and the decision was made to remove the hardware. On 03-20-21 he had the nails removed. Last night he did suffer a fall in the bathroom on his right hip. He was taken to the ED for further evaluation was found not to have any injury. Today he is slightly confused but is denying pain at this time. There is some mild bleeding present at the incision line. There are no indications of infection present.   Past Medical History:  Diagnosis Date   Anxiety    Cancer [facility])    prostate   Chronic chest wall pain    Essential hypertension, benign 06/20/2016   GAD (generalized anxiety disorder)    GERD (gastroesophageal reflux disease)    H/O echocardiogram 2016   normal   Heart murmur    "leaking heart valve"   High cholesterol 06/20/2016   HOH (hard of hearing)    left   Hx of falling    Interstitial pulmonary disease (HCC)    Lower GI bleed    MDD (major depressive disorder)    Murmur, cardiac    Poor historian    PUD (peptic ulcer disease)    RLS (restless legs syndrome)    Shortness of breath    Wrist fracture 12/16/2011    Past Surgical History:  Procedure Laterality Date   CHOLECYSTECTOMY N/A 06/28/2020   Procedure: LAPAROSCOPIC CHOLECYSTECTOMY;  Surgeon: [name], MD;  Location: [facility];  Service: General;  Laterality: N/A;  pt in [place]  2005   Rehman: Sigmoid colon diverticulosis, moderate sized external hemorrhoids. A few focal areas of mucosal erythema felt to be nonspecific.   ESOPHAGOGASTRODUODENOSCOPY      ESOPHAGOGASTRODUODENOSCOPY N/A 07/18/2016   Rehman: normal exam except duodenal scar. h.pylori serologies negative   FLEXIBLE SIGMOIDOSCOPY N/A 06/11/2020   Procedure: FLEXIBLE SIGMOIDOSCOPY;  Surgeon: Eloise Harman, DO;  Location: [facility];  Service: Endoscopy;  Laterality: N/A;   HIP PINNING,CANNULATED Right 03/10/2020   Procedure: INTERNAL FIXATION RIGHT HIP;  Surgeon: Carole Civil, MD;  Location: [facility];  Service: Orthopedics;  Laterality: Right;   ORIF WRIST FRACTURE  12/19/2011   Procedure: OPEN REDUCTION INTERNAL FIXATION (ORIF) WRIST FRACTURE;  Surgeon: Carole Civil, MD;  Location: [facility];  Service: Orthopedics;  Laterality: Right;   prostate cancer     Diagnosed in the 90s   TONSILLECTOMY      Social History   Socioeconomic History   Marital status: Widowed    Spouse name: Not on file   Number of children: Not on file   Years of education: Not on file   Highest education level: Not on file  Occupational History   Occupation: retired    Fish farm manager: RETIRED  Tobacco Use   Smoking status: Never   Smokeless tobacco: Never  Vaping Use   Vaping Use: Never used  Substance and Sexual Activity   Alcohol use: No   Drug use: No   Sexual activity: Not  Currently  Other Topics Concern   Not on file  Social History Narrative   Is long term resident of [facility]    Social Determinants of Health   Financial Resource Strain: Not on file  Food Insecurity: Not on file  Transportation Needs: Not on file  Physical Activity: Not on file  Stress: Not on file  Social Connections: Not on file  Intimate Partner Violence: Not on file   Family History  Problem Relation Age of Onset   Cancer Other    Heart attack Mother    Cancer Brother    Heart attack Brother    Heart disease Sister        by pass      VITAL SIGNS BP (!) 145/79   Pulse 61   Temp (!) 97.4 F (36.3 C)   Resp 16   Ht 5\' 9"  (1.753 m)   Wt 173 lb 3.2 oz (78.6 kg)   SpO2 98%   BMI 25.58 kg/m    Outpatient Encounter Medications as of 03/21/2021  Medication Sig Note   acetaminophen (TYLENOL) 500 MG tablet Take 500 mg by mouth every 6 (six) hours. Max 3000mg  in 24 hour period    acetaminophen-codeine (TYLENOL #3) 300-30 MG tablet Take 1 tablet by mouth every 6 (six) hours as needed for moderate pain.    alfuzosin (UROXATRAL) 10 MG 24 hr tablet Take 10 mg by mouth daily with breakfast.    antiseptic oral rinse (BIOTENE) LIQD 10 mLs by Mouth Rinse route in the morning and at bedtime.    Ascorbic Acid (VITAMIN C) 1000 MG tablet Take 1,000 mg by mouth daily.    aspirin 81 MG chewable tablet Chew 1 tablet (81 mg total) by mouth daily with breakfast.    Balsam [place]-Castor Oil (VENELEX) OINT Apply 1 application topically every 8 (eight) hours as needed (Apply to bilateral buttocks & sacral area qshift for blanchable erythema.).    calcium carbonate (TUMS - DOSED IN MG ELEMENTAL CALCIUM) 500 MG chewable tablet Chew 1 tablet by mouth 2 (two) times daily as needed for indigestion or heartburn.     diltiazem (CARDIZEM CD) 360 MG 24 hr capsule Take 1 capsule (360 mg total) by mouth daily.    ferrous sulfate 325 (65 FE) MG tablet Take 325 mg by mouth daily with breakfast. For Anemia and  Iron Deficiency    Flax Oil-Fish Oil-Borage Oil (FISH OIL-FLAX OIL-BORAGE OIL) CAPS Take 1 capsule by mouth daily.    hydrochlorothiazide (MICROZIDE) 12.5 MG capsule Take 12.5 mg by mouth daily.    ipratropium-albuterol (DUONEB) 0.5-2.5 (3) MG/3ML SOLN Take 3 mLs by nebulization every 6 (six) hours as needed (shortness of breath or wheezing).    isosorbide mononitrate (IMDUR) 30 MG 24 hr tablet Take 1 tablet (30 mg total) by mouth daily.    lisinopril (ZESTRIL) 40 MG tablet Take 1 tablet (40 mg total) by mouth daily.    loperamide (IMODIUM A-D) 2 MG tablet Take 2 mg by mouth as needed (after each stool. (do not exceed more than 4 tabs in 24 hours)).    Melatonin 10 MG TABS Take 10 mg by mouth at bedtime.     meloxicam (MOBIC) 7.5 MG tablet Take 7.5 mg by mouth daily. give for left hip pain    memantine (NAMENDA) 10 MG tablet Take 10 mg by mouth 2 (two) times daily.    methocarbamol (ROBAXIN) 500 MG tablet Take 500 mg by mouth daily.    metoprolol tartrate (LOPRESSOR)  25 MG tablet Take 1 tablet (25 mg total) by mouth 2 (two) times daily.    omeprazole (PRILOSEC) 20 MG capsule Take 1 capsule (20 mg total) by mouth daily before breakfast.    ondansetron (ZOFRAN) 8 MG tablet Take 8 mg by mouth every 8 (eight) hours as needed for nausea.    ondansetron (ZOFRAN-ODT) 8 MG disintegrating tablet Take 8 mg by mouth 3 (three) times daily as needed for vomiting or nausea (before each meal as needed).    potassium chloride SA (KLOR-CON) 20 MEQ tablet Take 40 mEq by mouth 3 (three) times daily. 03/09/2021: Long term regimen. No duration specified.   rOPINIRole (REQUIP) 0.25 MG tablet Take 0.25 mg by mouth at bedtime.    senna-docusate (SENOKOT-S) 8.6-50 MG tablet Take 1 tablet by mouth at bedtime as needed for mild constipation.    simethicone (MYLICON) 546 MG chewable tablet Chew 125 mg by mouth 3 (three) times daily as needed ("increased gas").    spironolactone (ALDACTONE) 25 MG tablet Take 25 mg by mouth daily.    venlafaxine XR (EFFEXOR-XR) 150 MG 24 hr capsule Take 150 mg by mouth daily with breakfast.    [DISCONTINUED] polyethylene glycol (MIRALAX / GLYCOLAX) 17 g packet Take 17 g by mouth daily.    No facility-administered encounter medications on file as of 03/21/2021.     SIGNIFICANT DIAGNOSTIC EXAMS  PREVIOUS   06-09-20: ct of abdomen and pelvis:  Cholelithiasis. Haziness noted around the gallbladder. Cannot exclude acute cholecystitis. Continued wall thickening in the left colon from the distal transverse colon through the distal descending colon concerning for colitis. Left colonic diverticulosis. Small bilateral pleural effusions, increasing since prior study. Bibasilar atelectasis. Coronary  artery disease, aortic atherosclerosis.  06-09-20: abdominal ultrasound:  1. Moderate gallbladder sludge with probable punctate stones. Slight increased wall thickness with small amount of fluid adjacent to the gallbladder but negative sonographic Murphy. Findings are suggestive of but not definitive for cholecystitis; consider correlation with nuclear medicine hepatobiliary imaging. 6 mm gallbladder polyp. 2. Simple appearing renal cysts. 3. Incidental note made of pleural effusions.  06-12-20: HIDA:  1. No scintigraphic evidence of acute cholecystitis. 2. Findings compatible with severe biliary dyskinesia with little to no gallbladder emptying and elicitation of abdominal pain with the CCK administration.  06-13-20: chest x-ray:  Diffuse interstitial opacity with Awanda Mink lines with possible trace pleural fluid. Stable heart size and aortic contours. Negative for air leak  NO NEW EXAMS.    LABS REVIEWED PREVIOUS    03-10-20: wbc 11.2; hgb 11.3; hct 35.9; mcv 82.7 plt 188; glucose 160; bun 18; creat 1.21; k+ 3.3; na++ 139; ca 8.8 liver normal 3.0; mag 2.3 03-12-20: wbc 10.4; hgb 9.3; hct 29.6; mcv 84.3 plt 178; glucose 138; bun 34; creat 1.77; k+ 3.8; na++ 138; ca 8.3 03-14-20: glucose 134; bun 36; creat 1.23; k+ 3.2; na++ 139; ca 8.8 mag 2.1   03-16-20: hgb 10.7; hct 33.4 glucose 127; bun 18; creat 0.86 k+ 3.3; na++ 139; ca 9.2 ;liver normal albumin 2.5 iron 23; tibc 221; ferritin 137 03-23-20: k+ 3.0  03-27-20: glucose 133; bun 34; creat 0.85; k+ 3.6; na++ 142; ca 9.0; AM cortisol: 10.6  05-22-20: wbc 19.4; hgb 14.6; hct 43.8; mcv 81.6 plt 254; glucose 211; bun 35; creat 1.20; k+ 3.0; na++ 138; ca 9.8; liver normal albumin 3.5 mag 1.9; urine culture: <10,000; blood culture: no growth 05-25-20: wbc 14.2; hgb 20.8; hct 34.8; mcv 84.7 plt 212; glucose 109; bun 24; creat 0.86; k+  3.4; na++ 137; ca 8.9; mag 1.9 05-28-20: wbc 10.9; hgb 11.8; hct 36.8; mcv 81.8 plt 227; glucose 132; bun 10; creat 0.82;  k+ 3.0; na++ 137; ca 8.7 06-01-20: wbc 12.2; hgb 12.5; hct 40.3; mcv 81.9 plt 136; glucose 136; bun 12; creat 0.81; k+ 3.0; na++ 140; ca 8.9 mag 1.9 06-05-20: wbc 13.6; hgb 10.2; hct 32.9; mcv 93.5 plt 266 glucose 125; bun 35; creat 1.26; k+ 3.5; na++ 137; ca 8.9  06-07-20: wbc 11.9; hgb 9.6; hct 29.8; mcv 82.8 plt 318; glucose 114; bun 42; creat 1.47; k+ 3.9; na++ 138; ca 8.8 GFR 46; liver normal albumin 2.1  06-08-20: glucose 107; bun 57; creat 1.81; k+ 4.3; na++ 135; ca 8.9 GFR 36 06-09-20: glucose 107; bun 52; creat 1.64; k+ 5.3; na++ 136; ca 9.3;  GFR 40; liver normal albumin 2.3 06-10-20: wbc 12.6; hgb 10.5; hct 34.2; mcv 83.4 plt 417; glucose 126; bun 39; creat 4.4; na++ 137; ca 9.5 GFR 58; liver normal albumin 2.2 06-12-20: wbc 8.2; hgb 10.4; hct 33.6; mcv 81.2 plt 364; glucose 123; bun 24; creat 0.87; k+ 3.5; na++ 138; GFR >60; liver normal albumin 1.9 06-13-20: urine culture: <10,000 colonies 06-14-20: wbc 9.0; hgb 10.5; hct 33.8; mcv 80.5 plt 267; glucose 122; bun 20; creat 0.97; k+3.3; na++ 140; ca 9.0 GFR >60 06-29-20: wbc 10.1; hgb 10.7; hct 36.1; mcv 83.0 plt 294; glucose 152; bun 26; creat 1.02; k+ 4.4; na++ 138 ca 8.8 GFR>60 07-24-20 PSA 16.0 08-14-20: glucose 110; bun 33; creat 1.00 ;k+ 2.5 na++ 140 08-15-20: glucose 114; bun 31; creat  0.84; k+ 3.1; na++ 142 08-21-20: k+ 3.5 10-04-20: albumin 3.2  10-19-20: PSA 12.25 (13.05)  12-21-20: wbc 4.6; hgb 13.4 hct 40.9 mcv 86.1 plt 223; glucose 104; bun 22; creat 0.89; k+ 3.0; na++ 143; ca 9.3 GFR>60; liver normal albumin 3.3; chol 128; ldl 71; trig 85 hdl 40   12-25-20: k+ 3.2 01-01-21: k+ 3.2 01-08-21: k+ 4.5 01-19-21: glucose 108; bun 20; creat 0.96; k+ 3.6; na++ 139; ca 9.0 GFR>60 02-05-21: glucose 109; bun 25; creat 1.05; k+ 4.5; na++ 141; ca 9.5; GFR>60.  03-13-21: wbc 7.3; hgb 14.0; hct 42.5; mcv 85.5 plt 212; glucose 126; bun 24; creat 1.03 ;k+ 3.9; na++ 139; ca 9.6; GFR>60  TODAY   03-15-21: hgb a1c 5.7 03-20-21: wbc 20.3; hgb 14.6; hct 44.4;  mcv 86.7 plt 229; glcusoe 201; bun 26; creat 1.44; k+ 4.2; na++ 138; ca 9.2; GFR 46    Review of Systems  Constitutional:  Negative for malaise/fatigue.  Respiratory:  Negative for cough and shortness of breath.   Cardiovascular:  Negative for chest pain, palpitations and leg swelling.  Gastrointestinal:  Negative for abdominal pain, constipation and heartburn.  Musculoskeletal:  Negative for back pain, joint pain and myalgias.  Skin: Negative.   Neurological:  Negative for dizziness.  Psychiatric/Behavioral:  The patient is not nervous/anxious.     Physical Exam Constitutional:      General: He is not in acute distress.    Appearance: He is well-developed. He is not diaphoretic.  Neck:     Thyroid: No thyromegaly.  Cardiovascular:     Rate and Rhythm: Normal rate and regular rhythm.     Pulses: Normal pulses.     Heart sounds: Normal heart sounds.  Pulmonary:     Effort: Pulmonary effort is normal. No respiratory distress.     Breath sounds: Normal breath sounds.  Abdominal:     General: Bowel sounds are normal.  There is no distension.     Palpations: Abdomen is soft.     Tenderness: There is no abdominal tenderness.  Musculoskeletal:        General: Normal range of motion.     Cervical back: Neck supple.     Right lower leg: No edema.     Left lower leg: No edema.  Lymphadenopathy:     Cervical: No cervical adenopathy.  Skin:    General: Skin is warm and dry.     Comments: Right hip incision line: without signs if infection present   Neurological:     Mental Status: He is alert. Mental status is at baseline.  Psychiatric:        Mood and Affect: Mood normal.      ASSESSMENT/ PLAN:  TODAY  Closed right hip fracture sequela Benign hypertension with chronic kidney disease stage III  He is taking multiple medications to manage his hypertension. He had been taking myrbetriq which has been stopped.  Will check cortisol and adrenosterone levels he does have a  history of renal stenosis  Will continue to monitor his status.    Ok Edwards NP [facility]  Contact [phone] Monday through Friday 8am- 5pm  After hours call [phone]

## 2021-03-22 ENCOUNTER — Other Ambulatory Visit (HOSPITAL_COMMUNITY)
Admission: RE | Admit: 2021-03-22 | Discharge: 2021-03-22 | Disposition: A | Discharge status: Home / Self Care | Payer: Medicare Other | Source: Skilled Nursing Facility | Attending: Adult Health | Admitting: Adult Health

## 2021-03-22 ENCOUNTER — Telehealth: Payer: Self-pay | Admitting: Orthopedic Surgery

## 2021-03-22 DIAGNOSIS — I10 Essential (primary) hypertension: Secondary | ICD-10-CM | POA: Diagnosis not present

## 2021-03-22 DIAGNOSIS — F331 Major depressive disorder, recurrent, moderate: Secondary | ICD-10-CM | POA: Diagnosis not present

## 2021-03-22 LAB — CORTISOL-AM, BLOOD: Cortisol - AM: 19.5 ug/dL (ref 6.7–22.6)

## 2021-03-22 NOTE — Telephone Encounter (Signed)
Done

## 2021-03-22 NOTE — Telephone Encounter (Signed)
Per patient's hardware removal surgery of 03/20/21, when is patient to return for post op visit? We are to call back to [place], [phone], to schedule

## 2021-03-26 ENCOUNTER — Other Ambulatory Visit (HOSPITAL_COMMUNITY)
Admission: RE | Admit: 2021-03-26 | Discharge: 2021-03-26 | Disposition: A | Discharge status: Home / Self Care | Payer: Medicare Other | Source: Skilled Nursing Facility | Attending: Adult Health | Admitting: Adult Health

## 2021-03-26 DIAGNOSIS — I10 Essential (primary) hypertension: Secondary | ICD-10-CM | POA: Diagnosis not present

## 2021-03-26 LAB — CBC WITH DIFFERENTIAL/PLATELET
Abs Immature Granulocytes: 0.16 10*3/uL — ABNORMAL HIGH (ref 0.00–0.07)
Basophils Absolute: 0 10*3/uL (ref 0.0–0.1)
Basophils Relative: 1 %
Eosinophils Absolute: 0.1 10*3/uL (ref 0.0–0.5)
Eosinophils Relative: 2 %
HCT: 37.9 % — ABNORMAL LOW (ref 39.0–52.0)
Hemoglobin: 12.4 g/dL — ABNORMAL LOW (ref 13.0–17.0)
Immature Granulocytes: 3 %
Lymphocytes Relative: 30 %
Lymphs Abs: 1.9 10*3/uL (ref 0.7–4.0)
MCH: 28.6 pg (ref 26.0–34.0)
MCHC: 32.7 g/dL (ref 30.0–36.0)
MCV: 87.5 fL (ref 80.0–100.0)
Monocytes Absolute: 0.5 10*3/uL (ref 0.1–1.0)
Monocytes Relative: 8 %
Neutro Abs: 3.6 10*3/uL (ref 1.7–7.7)
Neutrophils Relative %: 56 %
Platelets: 230 10*3/uL (ref 150–400)
RBC: 4.33 MIL/uL (ref 4.22–5.81)
RDW: 14.6 % (ref 11.5–15.5)
WBC: 6.3 10*3/uL (ref 4.0–10.5)
nRBC: 0 % (ref 0.0–0.2)

## 2021-03-26 LAB — BASIC METABOLIC PANEL
Anion gap: 4 — ABNORMAL LOW (ref 5–15)
BUN: 28 mg/dL — ABNORMAL HIGH (ref 8–23)
CO2: 28 mmol/L (ref 22–32)
Calcium: 9.2 mg/dL (ref 8.9–10.3)
Chloride: 106 mmol/L (ref 98–111)
Creatinine, Ser: 1.13 mg/dL (ref 0.61–1.24)
GFR, Estimated: >60 mL/min (ref >60)
Glucose, Bld: 117 mg/dL — ABNORMAL HIGH (ref 70–99)
Potassium: 4.4 mmol/L (ref 3.5–5.1)
Sodium: 138 mmol/L (ref 135–145)

## 2021-03-27 ENCOUNTER — Telehealth: Payer: Self-pay | Admitting: Orthopedic Surgery

## 2021-03-27 DIAGNOSIS — M6281 Muscle weakness (generalized): Secondary | ICD-10-CM | POA: Diagnosis not present

## 2021-03-27 DIAGNOSIS — R41841 Cognitive communication deficit: Secondary | ICD-10-CM | POA: Diagnosis not present

## 2021-03-27 DIAGNOSIS — Z9181 History of falling: Secondary | ICD-10-CM | POA: Diagnosis not present

## 2021-03-27 DIAGNOSIS — R2681 Unsteadiness on feet: Secondary | ICD-10-CM | POA: Diagnosis not present

## 2021-03-27 DIAGNOSIS — Z48815 Encounter for surgical aftercare following surgery on the digestive system: Secondary | ICD-10-CM | POA: Diagnosis not present

## 2021-03-27 DIAGNOSIS — Z741 Need for assistance with personal care: Secondary | ICD-10-CM | POA: Diagnosis not present

## 2021-03-27 DIAGNOSIS — R262 Difficulty in walking, not elsewhere classified: Secondary | ICD-10-CM | POA: Diagnosis not present

## 2021-03-27 DIAGNOSIS — Z472 Encounter for removal of internal fixation device: Secondary | ICD-10-CM | POA: Diagnosis not present

## 2021-03-27 NOTE — Telephone Encounter (Signed)
Tammy Ward called from [facility] and needs to know the restrictions for his Physical Therapy  Please call and advise.

## 2021-03-27 NOTE — Telephone Encounter (Signed)
The postoperative plan is for him to be protected weightbearing as tolerated with a walker for the next 4 weeks I called her to advise.

## 2021-03-28 DIAGNOSIS — M6281 Muscle weakness (generalized): Secondary | ICD-10-CM | POA: Diagnosis not present

## 2021-03-28 DIAGNOSIS — Z9181 History of falling: Secondary | ICD-10-CM | POA: Diagnosis not present

## 2021-03-28 DIAGNOSIS — R2681 Unsteadiness on feet: Secondary | ICD-10-CM | POA: Diagnosis not present

## 2021-03-28 DIAGNOSIS — R262 Difficulty in walking, not elsewhere classified: Secondary | ICD-10-CM | POA: Diagnosis not present

## 2021-03-28 DIAGNOSIS — Z48815 Encounter for surgical aftercare following surgery on the digestive system: Secondary | ICD-10-CM | POA: Diagnosis not present

## 2021-03-28 DIAGNOSIS — F331 Major depressive disorder, recurrent, moderate: Secondary | ICD-10-CM | POA: Diagnosis not present

## 2021-03-28 DIAGNOSIS — Z472 Encounter for removal of internal fixation device: Secondary | ICD-10-CM | POA: Diagnosis not present

## 2021-03-29 DIAGNOSIS — R2681 Unsteadiness on feet: Secondary | ICD-10-CM | POA: Diagnosis not present

## 2021-03-29 DIAGNOSIS — M6281 Muscle weakness (generalized): Secondary | ICD-10-CM | POA: Diagnosis not present

## 2021-03-29 DIAGNOSIS — Z9181 History of falling: Secondary | ICD-10-CM | POA: Diagnosis not present

## 2021-03-29 DIAGNOSIS — Z48815 Encounter for surgical aftercare following surgery on the digestive system: Secondary | ICD-10-CM | POA: Diagnosis not present

## 2021-03-29 DIAGNOSIS — R262 Difficulty in walking, not elsewhere classified: Secondary | ICD-10-CM | POA: Diagnosis not present

## 2021-03-29 DIAGNOSIS — Z472 Encounter for removal of internal fixation device: Secondary | ICD-10-CM | POA: Diagnosis not present

## 2021-03-29 LAB — ALDOSTERONE: Aldosterone: 7.6 ng/dL (ref 0.0–30.0)

## 2021-03-30 ENCOUNTER — Non-Acute Institutional Stay (SKILLED_NURSING_FACILITY): Payer: Medicare Other | Admitting: Adult Health

## 2021-03-30 ENCOUNTER — Encounter: Payer: Self-pay | Admitting: Adult Health

## 2021-03-30 DIAGNOSIS — R2681 Unsteadiness on feet: Secondary | ICD-10-CM | POA: Diagnosis not present

## 2021-03-30 DIAGNOSIS — R262 Difficulty in walking, not elsewhere classified: Secondary | ICD-10-CM | POA: Diagnosis not present

## 2021-03-30 DIAGNOSIS — F339 Major depressive disorder, recurrent, unspecified: Secondary | ICD-10-CM | POA: Diagnosis not present

## 2021-03-30 DIAGNOSIS — Z48815 Encounter for surgical aftercare following surgery on the digestive system: Secondary | ICD-10-CM | POA: Diagnosis not present

## 2021-03-30 DIAGNOSIS — Z9181 History of falling: Secondary | ICD-10-CM | POA: Diagnosis not present

## 2021-03-30 DIAGNOSIS — Z472 Encounter for removal of internal fixation device: Secondary | ICD-10-CM | POA: Diagnosis not present

## 2021-03-30 DIAGNOSIS — M6281 Muscle weakness (generalized): Secondary | ICD-10-CM | POA: Diagnosis not present

## 2021-03-30 NOTE — Progress Notes (Signed)
Location:  [facility] Room Number: 156 Place of Service:  SNF (31)   CODE STATUS: dnr   Allergies  Allergen Reactions   Hctz [Hydrochlorothiazide]     hypokalemia    Chief Complaint  Patient presents with   Acute Visit    Depression     HPI:  Staff is concerned about his worsening depression. He has told the staff that he no longer wants to have any visitors. He tells me that his depression and anxiety are chronic; but are getting worse. He is presently taking effexor xr 150 mg daily. He cannot take higher dose due to hypertension. He tells me that his appetite is the same. He does complain of insomnia. We discussed adjunct therapy such as antipsychotics. He is agreeable to try this medication class; is willing to try seroquel.    Past Medical History:  Diagnosis Date   Anxiety    Cancer [facility])    prostate   Chronic chest wall pain    Essential hypertension, benign 06/20/2016   GAD (generalized anxiety disorder)    GERD (gastroesophageal reflux disease)    H/O echocardiogram 2016   normal   Heart murmur    "leaking heart valve"   High cholesterol 06/20/2016   HOH (hard of hearing)    left   Hx of falling    Interstitial pulmonary disease (HCC)    Lower GI bleed    MDD (major depressive disorder)    Murmur, cardiac    Poor historian    PUD (peptic ulcer disease)    RLS (restless legs syndrome)    Shortness of breath    Wrist fracture 12/16/2011    Past Surgical History:  Procedure Laterality Date   CHOLECYSTECTOMY N/A 06/28/2020   Procedure: LAPAROSCOPIC CHOLECYSTECTOMY;  Surgeon: [name], MD;  Location: [facility] ORS;  Service: General;  Laterality: N/A;  pt in [place]  2005   Rehman: Sigmoid colon diverticulosis, moderate sized external hemorrhoids. A few focal areas of mucosal erythema felt to be nonspecific.   ESOPHAGOGASTRODUODENOSCOPY     ESOPHAGOGASTRODUODENOSCOPY N/A 07/18/2016   Rehman: normal exam except  duodenal scar. h.pylori serologies negative   FLEXIBLE SIGMOIDOSCOPY N/A 06/11/2020   Procedure: FLEXIBLE SIGMOIDOSCOPY;  Surgeon: Eloise Harman, DO;  Location: [facility] ENDO SUITE;  Service: Endoscopy;  Laterality: N/A;   HARDWARE REMOVAL Right 03/20/2021   Procedure: HARDWARE REMOVAL right hip;  Surgeon: Carole Civil, MD;  Location: [facility] ORS;  Service: Orthopedics;  Laterality: Right;   HIP PINNING,CANNULATED Right 03/10/2020   Procedure: INTERNAL FIXATION RIGHT HIP;  Surgeon: Carole Civil, MD;  Location: [facility] ORS;  Service: Orthopedics;  Laterality: Right;   ORIF WRIST FRACTURE  12/19/2011   Procedure: OPEN REDUCTION INTERNAL FIXATION (ORIF) WRIST FRACTURE;  Surgeon: Carole Civil, MD;  Location: [facility] OR[facility];  Service: Orthopedics;  Laterality: Right;   prostate cancer     Diagnosed in the 90s   TONSILLECTOMY      Social History   Socioeconomic History   Marital status: Widowed    Spouse name: Not on file   Number of children: Not on file   Years of education: Not on file   Highest education level: Not on file  Occupational History   Occupation: retired    Fish farm manager: RETIRED  Tobacco Use   Smoking status: Never   Smokeless tobacco: Never  Vaping Use   Vaping Use: Never used  Substance and Sexual Activity  Alcohol use: No   Drug use: No   Sexual activity: Not Currently  Other Topics Concern   Not on file  Social History Narrative   Is long term resident of [facility]    Social Determinants of Health   Financial Resource Strain: Not on file  Food Insecurity: Not on file  Transportation Needs: Not on file  Physical Activity: Not on file  Stress: Not on file  Social Connections: Not on file  Intimate Partner Violence: Not on file   Family History  Problem Relation Age of Onset   Cancer Other    Heart attack Mother    Cancer Brother    Heart attack Brother    Heart disease Sister        by pass      VITAL SIGNS BP 140/80   Pulse 81   Temp 97.6 F (36.4 C)    Ht 5\' 9"  (1.753 m)   Wt 173 lb 3.2 oz (78.6 kg)   BMI 25.58 kg/m   Outpatient Encounter Medications as of 03/30/2021  Medication Sig Note   acetaminophen (TYLENOL) 500 MG tablet Take 500 mg by mouth every 6 (six) hours. Max 3000mg  in 24 hour period    acetaminophen-codeine (TYLENOL #3) 300-30 MG tablet Take 1 tablet by mouth every 6 (six) hours as needed for moderate pain.    alfuzosin (UROXATRAL) 10 MG 24 hr tablet Take 10 mg by mouth daily with breakfast.    antiseptic oral rinse (BIOTENE) LIQD 10 mLs by Mouth Rinse route in the morning and at bedtime.    Ascorbic Acid (VITAMIN C) 1000 MG tablet Take 1,000 mg by mouth daily.    aspirin 81 MG chewable tablet Chew 1 tablet (81 mg total) by mouth daily with breakfast.    Balsam [place]-Castor Oil (VENELEX) OINT Apply 1 application topically every 8 (eight) hours as needed (Apply to bilateral buttocks & sacral area qshift for blanchable erythema.).    calcium carbonate (TUMS - DOSED IN MG ELEMENTAL CALCIUM) 500 MG chewable tablet Chew 1 tablet by mouth 2 (two) times daily as needed for indigestion or heartburn.     diltiazem (CARDIZEM CD) 360 MG 24 hr capsule Take 1 capsule (360 mg total) by mouth daily.    ferrous sulfate 325 (65 FE) MG tablet Take 325 mg by mouth daily with breakfast. For Anemia and  Iron Deficiency    Flax Oil-Fish Oil-Borage Oil (FISH OIL-FLAX OIL-BORAGE OIL) CAPS Take 1 capsule by mouth daily.    hydrochlorothiazide (MICROZIDE) 12.5 MG capsule Take 12.5 mg by mouth daily.    ipratropium-albuterol (DUONEB) 0.5-2.5 (3) MG/3ML SOLN Take 3 mLs by nebulization every 6 (six) hours as needed (shortness of breath or wheezing).    isosorbide mononitrate (IMDUR) 30 MG 24 hr tablet Take 1 tablet (30 mg total) by mouth daily.    lisinopril (ZESTRIL) 40 MG tablet Take 1 tablet (40 mg total) by mouth daily.    loperamide (IMODIUM A-D) 2 MG tablet Take 2 mg by mouth as needed (after each stool. (do not exceed more than 4 tabs in 24 hours)).     Melatonin 10 MG TABS Take 10 mg by mouth at bedtime.    meloxicam (MOBIC) 7.5 MG tablet Take 7.5 mg by mouth daily. give for left hip pain    memantine (NAMENDA) 10 MG tablet Take 10 mg by mouth 2 (two) times daily.    methocarbamol (ROBAXIN) 500 MG tablet Take 500 mg by mouth daily.  metoprolol tartrate (LOPRESSOR) 25 MG tablet Take 1 tablet (25 mg total) by mouth 2 (two) times daily.    omeprazole (PRILOSEC) 20 MG capsule Take 1 capsule (20 mg total) by mouth daily before breakfast.    ondansetron (ZOFRAN) 8 MG tablet Take 8 mg by mouth every 8 (eight) hours as needed for nausea.    ondansetron (ZOFRAN-ODT) 8 MG disintegrating tablet Take 8 mg by mouth 3 (three) times daily as needed for vomiting or nausea (before each meal as needed).    potassium chloride SA (KLOR-CON) 20 MEQ tablet Take 40 mEq by mouth 3 (three) times daily. 03/09/2021: Long term regimen. No duration specified.   rOPINIRole (REQUIP) 0.25 MG tablet Take 0.25 mg by mouth at bedtime.    senna-docusate (SENOKOT-S) 8.6-50 MG tablet Take 1 tablet by mouth at bedtime as needed for mild constipation.    simethicone (MYLICON) 664 MG chewable tablet Chew 125 mg by mouth 3 (three) times daily as needed ("increased gas").    spironolactone (ALDACTONE) 25 MG tablet Take 25 mg by mouth daily.    venlafaxine XR (EFFEXOR-XR) 150 MG 24 hr capsule Take 150 mg by mouth daily with breakfast.    No facility-administered encounter medications on file as of 03/30/2021.     SIGNIFICANT DIAGNOSTIC EXAMS  PREVIOUS   06-09-20: ct of abdomen and pelvis:  Cholelithiasis. Haziness noted around the gallbladder. Cannot exclude acute cholecystitis. Continued wall thickening in the left colon from the distal transverse colon through the distal descending colon concerning for colitis. Left colonic diverticulosis. Small bilateral pleural effusions, increasing since prior study. Bibasilar atelectasis. Coronary artery disease, aortic  atherosclerosis.  06-09-20: abdominal ultrasound:  1. Moderate gallbladder sludge with probable punctate stones. Slight increased wall thickness with small amount of fluid adjacent to the gallbladder but negative sonographic Murphy. Findings are suggestive of but not definitive for cholecystitis; consider correlation with nuclear medicine hepatobiliary imaging. 6 mm gallbladder polyp. 2. Simple appearing renal cysts. 3. Incidental note made of pleural effusions.  06-12-20: HIDA:  1. No scintigraphic evidence of acute cholecystitis. 2. Findings compatible with severe biliary dyskinesia with little to no gallbladder emptying and elicitation of abdominal pain with the CCK administration.  06-13-20: chest x-ray:  Diffuse interstitial opacity with Awanda Mink lines with possible trace pleural fluid. Stable heart size and aortic contours. Negative for air leak  NO NEW EXAMS.    LABS REVIEWED PREVIOUS    03-10-20: wbc 11.2; hgb 11.3; hct 35.9; mcv 82.7 plt 188; glucose 160; bun 18; creat 1.21; k+ 3.3; na++ 139; ca 8.8 liver normal 3.0; mag 2.3 03-12-20: wbc 10.4; hgb 9.3; hct 29.6; mcv 84.3 plt 178; glucose 138; bun 34; creat 1.77; k+ 3.8; na++ 138; ca 8.3 03-14-20: glucose 134; bun 36; creat 1.23; k+ 3.2; na++ 139; ca 8.8 mag 2.1   03-16-20: hgb 10.7; hct 33.4 glucose 127; bun 18; creat 0.86 k+ 3.3; na++ 139; ca 9.2 ;liver normal albumin 2.5 iron 23; tibc 221; ferritin 137 03-23-20: k+ 3.0  03-27-20: glucose 133; bun 34; creat 0.85; k+ 3.6; na++ 142; ca 9.0; AM cortisol: 10.6  05-22-20: wbc 19.4; hgb 14.6; hct 43.8; mcv 81.6 plt 254; glucose 211; bun 35; creat 1.20; k+ 3.0; na++ 138; ca 9.8; liver normal albumin 3.5 mag 1.9; urine culture: <10,000; blood culture: no growth 05-25-20: wbc 14.2; hgb 20.8; hct 34.8; mcv 84.7 plt 212; glucose 109; bun 24; creat 0.86; k+ 3.4; na++ 137; ca 8.9; mag 1.9 05-28-20: wbc 10.9; hgb 11.8; hct 36.8; mcv  81.8 plt 227; glucose 132; bun 10; creat 0.82; k+ 3.0; na++ 137; ca  8.7 06-01-20: wbc 12.2; hgb 12.5; hct 40.3; mcv 81.9 plt 136; glucose 136; bun 12; creat 0.81; k+ 3.0; na++ 140; ca 8.9 mag 1.9 06-05-20: wbc 13.6; hgb 10.2; hct 32.9; mcv 93.5 plt 266 glucose 125; bun 35; creat 1.26; k+ 3.5; na++ 137; ca 8.9  06-07-20: wbc 11.9; hgb 9.6; hct 29.8; mcv 82.8 plt 318; glucose 114; bun 42; creat 1.47; k+ 3.9; na++ 138; ca 8.8 GFR 46; liver normal albumin 2.1  06-08-20: glucose 107; bun 57; creat 1.81; k+ 4.3; na++ 135; ca 8.9 GFR 36 06-09-20: glucose 107; bun 52; creat 1.64; k+ 5.3; na++ 136; ca 9.3;  GFR 40; liver normal albumin 2.3 06-10-20: wbc 12.6; hgb 10.5; hct 34.2; mcv 83.4 plt 417; glucose 126; bun 39; creat 4.4; na++ 137; ca 9.5 GFR 58; liver normal albumin 2.2 06-12-20: wbc 8.2; hgb 10.4; hct 33.6; mcv 81.2 plt 364; glucose 123; bun 24; creat 0.87; k+ 3.5; na++ 138; GFR >60; liver normal albumin 1.9 06-13-20: urine culture: <10,000 colonies 06-14-20: wbc 9.0; hgb 10.5; hct 33.8; mcv 80.5 plt 267; glucose 122; bun 20; creat 0.97; k+3.3; na++ 140; ca 9.0 GFR >60 06-29-20: wbc 10.1; hgb 10.7; hct 36.1; mcv 83.0 plt 294; glucose 152; bun 26; creat 1.02; k+ 4.4; na++ 138 ca 8.8 GFR>60 07-24-20 PSA 16.0 08-14-20: glucose 110; bun 33; creat 1.00 ;k+ 2.5 na++ 140 08-15-20: glucose 114; bun 31; creat  0.84; k+ 3.1; na++ 142 08-21-20: k+ 3.5 10-04-20: albumin 3.2  10-19-20: PSA 12.25 (13.05)  12-21-20: wbc 4.6; hgb 13.4 hct 40.9 mcv 86.1 plt 223; glucose 104; bun 22; creat 0.89; k+ 3.0; na++ 143; ca 9.3 GFR>60; liver normal albumin 3.3; chol 128; ldl 71; trig 85 hdl 40   12-25-20: k+ 3.2 01-01-21: k+ 3.2 01-08-21: k+ 4.5 01-19-21: glucose 108; bun 20; creat 0.96; k+ 3.6; na++ 139; ca 9.0 GFR>60 02-05-21: glucose 109; bun 25; creat 1.05; k+ 4.5; na++ 141; ca 9.5; GFR>60.  03-13-21: wbc 7.3; hgb 14.0; hct 42.5; mcv 85.5 plt 212; glucose 126; bun 24; creat 1.03 ;k+ 3.9; na++ 139; ca 9.6; GFR>60  TODAY   03-15-21: hgb a1c 5.7 03-20-21: wbc 20.3; hgb 14.6; hct 44.4; mcv 86.7 plt 229;  glcusoe 201; bun 26; creat 1.44; k+ 4.2; na++ 138; ca 9.2; GFR 46  03-22-21: aldosterone 7.6; cortisol AM 19.5;  03-26-21: wbc 6.3; hgb 12.4; hct 37.9; mcv 87.5 plt 230; glucose 117; bun 28; creat 1.13; k+ 4.4; na++ 138; ca 9.2; GFR>60   Review of Systems  Constitutional:  Negative for malaise/fatigue.  Respiratory:  Negative for cough and shortness of breath.   Cardiovascular:  Negative for chest pain, palpitations and leg swelling.  Gastrointestinal:  Negative for abdominal pain, constipation and heartburn.  Musculoskeletal:  Negative for back pain, joint pain and myalgias.  Skin: Negative.   Neurological:  Negative for dizziness.  Psychiatric/Behavioral:  Positive for depression. Negative for suicidal ideas. The patient is nervous/anxious and has insomnia.     Physical Exam Constitutional:      General: He is not in acute distress.    Appearance: He is well-developed. He is not diaphoretic.  Neck:     Thyroid: No thyromegaly.  Cardiovascular:     Rate and Rhythm: Normal rate and regular rhythm.     Pulses: Normal pulses.     Heart sounds: Normal heart sounds.  Pulmonary:     Effort: Pulmonary effort  is normal. No respiratory distress.     Breath sounds: Normal breath sounds.  Abdominal:     General: Bowel sounds are normal. There is no distension.     Palpations: Abdomen is soft.     Tenderness: There is no abdominal tenderness.  Musculoskeletal:        General: Normal range of motion.     Cervical back: Neck supple.     Right lower leg: No edema.     Left lower leg: No edema.  Lymphadenopathy:     Cervical: No cervical adenopathy.  Skin:    General: Skin is warm and dry.  Neurological:     Mental Status: He is alert and oriented to person, place, and time.  Psychiatric:        Mood and Affect: Mood normal.     ASSESSMENT/ PLAN:  TODAY  Major depression recurrent chronic: is worse will add seroquel 12.5 mg nightly to help with his depressive and anxious thoughts  and to help with insomnia. Will monitor      Ok Edwards NP [facility]  Contact [phone] Monday through Friday 8am- 5pm  After hours call [phone]

## 2021-04-02 ENCOUNTER — Non-Acute Institutional Stay (SKILLED_NURSING_FACILITY): Payer: Medicare Other | Admitting: Adult Health

## 2021-04-02 ENCOUNTER — Encounter: Payer: Self-pay | Admitting: Adult Health

## 2021-04-02 DIAGNOSIS — M6281 Muscle weakness (generalized): Secondary | ICD-10-CM | POA: Diagnosis not present

## 2021-04-02 DIAGNOSIS — F339 Major depressive disorder, recurrent, unspecified: Secondary | ICD-10-CM | POA: Diagnosis not present

## 2021-04-02 DIAGNOSIS — Z48815 Encounter for surgical aftercare following surgery on the digestive system: Secondary | ICD-10-CM | POA: Diagnosis not present

## 2021-04-02 DIAGNOSIS — Z9181 History of falling: Secondary | ICD-10-CM | POA: Diagnosis not present

## 2021-04-02 DIAGNOSIS — F321 Major depressive disorder, single episode, moderate: Secondary | ICD-10-CM | POA: Diagnosis not present

## 2021-04-02 DIAGNOSIS — Z472 Encounter for removal of internal fixation device: Secondary | ICD-10-CM | POA: Diagnosis not present

## 2021-04-02 DIAGNOSIS — G3184 Mild cognitive impairment, so stated: Secondary | ICD-10-CM | POA: Diagnosis not present

## 2021-04-02 DIAGNOSIS — R262 Difficulty in walking, not elsewhere classified: Secondary | ICD-10-CM | POA: Diagnosis not present

## 2021-04-02 DIAGNOSIS — F319 Bipolar disorder, unspecified: Secondary | ICD-10-CM | POA: Diagnosis not present

## 2021-04-02 DIAGNOSIS — R2681 Unsteadiness on feet: Secondary | ICD-10-CM | POA: Diagnosis not present

## 2021-04-02 DIAGNOSIS — F411 Generalized anxiety disorder: Secondary | ICD-10-CM | POA: Diagnosis not present

## 2021-04-02 NOTE — Progress Notes (Signed)
Location:  [place] Room Number: 156 Place of Service:  SNF (31)   CODE STATUS: dnr   Allergies  Allergen Reactions   Hctz [Hydrochlorothiazide]     hypokalemia    Chief Complaint  Patient presents with   Acute Visit    Mood state     HPI:  His mood has worsened over the past weekend. He has been angry; is spending more time in bed. Saturday he declined his meals. He states that he wants to die. He refused to see his son over the weekend. Psych services has seen him today. They suggest changing him to lamictal.   Past Medical History:  Diagnosis Date   Anxiety    Cancer [facility])    prostate   Chronic chest wall pain    Essential hypertension, benign 06/20/2016   GAD (generalized anxiety disorder)    GERD (gastroesophageal reflux disease)    H/O echocardiogram 2016   normal   Heart murmur    "leaking heart valve"   High cholesterol 06/20/2016   HOH (hard of hearing)    left   Hx of falling    Interstitial pulmonary disease (HCC)    Lower GI bleed    MDD (major depressive disorder)    Murmur, cardiac    Poor historian    PUD (peptic ulcer disease)    RLS (restless legs syndrome)    Shortness of breath    Wrist fracture 12/16/2011    Past Surgical History:  Procedure Laterality Date   CHOLECYSTECTOMY N/A 06/28/2020   Procedure: LAPAROSCOPIC CHOLECYSTECTOMY;  Surgeon: [name], MD;  Location: [facility];  Service: General;  Laterality: N/A;  pt in [place]  2005   Rehman: Sigmoid colon diverticulosis, moderate sized external hemorrhoids. A few focal areas of mucosal erythema felt to be nonspecific.   ESOPHAGOGASTRODUODENOSCOPY     ESOPHAGOGASTRODUODENOSCOPY N/A 07/18/2016   Rehman: normal exam except duodenal scar. h.pylori serologies negative   FLEXIBLE SIGMOIDOSCOPY N/A 06/11/2020   Procedure: FLEXIBLE SIGMOIDOSCOPY;  Surgeon: Eloise Harman, DO;  Location: [facility] SUITE;  Service: Endoscopy;  Laterality: N/A;    HARDWARE REMOVAL Right 03/20/2021   Procedure: HARDWARE REMOVAL right hip;  Surgeon: Carole Civil, MD;  Location: [facility];  Service: Orthopedics;  Laterality: Right;   HIP PINNING,CANNULATED Right 03/10/2020   Procedure: INTERNAL FIXATION RIGHT HIP;  Surgeon: Carole Civil, MD;  Location: [facility];  Service: Orthopedics;  Laterality: Right;   ORIF WRIST FRACTURE  12/19/2011   Procedure: OPEN REDUCTION INTERNAL FIXATION (ORIF) WRIST FRACTURE;  Surgeon: Carole Civil, MD;  Location: [facility];  Service: Orthopedics;  Laterality: Right;   prostate cancer     Diagnosed in the 90s   TONSILLECTOMY      Social History   Socioeconomic History   Marital status: Widowed    Spouse name: Not on file   Number of children: Not on file   Years of education: Not on file   Highest education level: Not on file  Occupational History   Occupation: retired    Fish farm manager: RETIRED  Tobacco Use   Smoking status: Never   Smokeless tobacco: Never  Vaping Use   Vaping Use: Never used  Substance and Sexual Activity   Alcohol use: No   Drug use: No   Sexual activity: Not Currently  Other Topics Concern   Not on file  Social History Narrative   Is long term resident of [facility]  Social Determinants of Health   Financial Resource Strain: Not on file  Food Insecurity: Not on file  Transportation Needs: Not on file  Physical Activity: Not on file  Stress: Not on file  Social Connections: Not on file  Intimate Partner Violence: Not on file   Family History  Problem Relation Age of Onset   Cancer Other    Heart attack Mother    Cancer Brother    Heart attack Brother    Heart disease Sister        by pass      VITAL SIGNS BP 136/74   Pulse 82   Temp 97.8 F (36.6 C)   Resp 16   Ht 5\' 9"  (1.753 m)   Wt 173 lb 3.2 oz (78.6 kg)   BMI 25.58 kg/m   Outpatient Encounter Medications as of 04/02/2021  Medication Sig Note   acetaminophen (TYLENOL) 500 MG tablet Take 500 mg by mouth  every 6 (six) hours. Max 3000mg  in 24 hour period    acetaminophen-codeine (TYLENOL #3) 300-30 MG tablet Take 1 tablet by mouth every 6 (six) hours as needed for moderate pain.    alfuzosin (UROXATRAL) 10 MG 24 hr tablet Take 10 mg by mouth daily with breakfast.    antiseptic oral rinse (BIOTENE) LIQD 10 mLs by Mouth Rinse route in the morning and at bedtime.    Ascorbic Acid (VITAMIN C) 1000 MG tablet Take 1,000 mg by mouth daily.    aspirin 81 MG chewable tablet Chew 1 tablet (81 mg total) by mouth daily with breakfast.    Balsam [place]-Castor Oil (VENELEX) OINT Apply 1 application topically every 8 (eight) hours as needed (Apply to bilateral buttocks & sacral area qshift for blanchable erythema.).    calcium carbonate (TUMS - DOSED IN MG ELEMENTAL CALCIUM) 500 MG chewable tablet Chew 1 tablet by mouth 2 (two) times daily as needed for indigestion or heartburn.     diltiazem (CARDIZEM CD) 360 MG 24 hr capsule Take 1 capsule (360 mg total) by mouth daily.    ferrous sulfate 325 (65 FE) MG tablet Take 325 mg by mouth daily with breakfast. For Anemia and  Iron Deficiency    Flax Oil-Fish Oil-Borage Oil (FISH OIL-FLAX OIL-BORAGE OIL) CAPS Take 1 capsule by mouth daily.    hydrochlorothiazide (MICROZIDE) 12.5 MG capsule Take 12.5 mg by mouth daily.    ipratropium-albuterol (DUONEB) 0.5-2.5 (3) MG/3ML SOLN Take 3 mLs by nebulization every 6 (six) hours as needed (shortness of breath or wheezing).    isosorbide mononitrate (IMDUR) 30 MG 24 hr tablet Take 1 tablet (30 mg total) by mouth daily.    lisinopril (ZESTRIL) 40 MG tablet Take 1 tablet (40 mg total) by mouth daily.    loperamide (IMODIUM A-D) 2 MG tablet Take 2 mg by mouth as needed (after each stool. (do not exceed more than 4 tabs in 24 hours)).    Melatonin 10 MG TABS Take 10 mg by mouth at bedtime.    meloxicam (MOBIC) 7.5 MG tablet Take 7.5 mg by mouth daily. give for left hip pain    memantine (NAMENDA) 10 MG tablet Take 10 mg by mouth 2  (two) times daily.    methocarbamol (ROBAXIN) 500 MG tablet Take 500 mg by mouth daily.    metoprolol tartrate (LOPRESSOR) 25 MG tablet Take 1 tablet (25 mg total) by mouth 2 (two) times daily.    omeprazole (PRILOSEC) 20 MG capsule Take 1 capsule (20 mg total) by mouth  daily before breakfast.    ondansetron (ZOFRAN) 8 MG tablet Take 8 mg by mouth every 8 (eight) hours as needed for nausea.    potassium chloride SA (KLOR-CON) 20 MEQ tablet Take 40 mEq by mouth 3 (three) times daily. 03/09/2021: Long term regimen. No duration specified.   QUEtiapine (SEROQUEL) 25 MG tablet Take 12.5 mg by mouth at bedtime.    rOPINIRole (REQUIP) 0.25 MG tablet Take 0.25 mg by mouth at bedtime.    senna-docusate (SENOKOT-S) 8.6-50 MG tablet Take 1 tablet by mouth at bedtime as needed for mild constipation.    simethicone (MYLICON) 403 MG chewable tablet Chew 125 mg by mouth 3 (three) times daily as needed ("increased gas").    spironolactone (ALDACTONE) 25 MG tablet Take 25 mg by mouth daily.    venlafaxine XR (EFFEXOR-XR) 150 MG 24 hr capsule Take 150 mg by mouth daily with breakfast.    [DISCONTINUED] ondansetron (ZOFRAN-ODT) 8 MG disintegrating tablet Take 8 mg by mouth 3 (three) times daily as needed for vomiting or nausea (before each meal as needed).    No facility-administered encounter medications on file as of 04/02/2021.     SIGNIFICANT DIAGNOSTIC EXAMS  PREVIOUS   06-09-20: ct of abdomen and pelvis:  Cholelithiasis. Haziness noted around the gallbladder. Cannot exclude acute cholecystitis. Continued wall thickening in the left colon from the distal transverse colon through the distal descending colon concerning for colitis. Left colonic diverticulosis. Small bilateral pleural effusions, increasing since prior study. Bibasilar atelectasis. Coronary artery disease, aortic atherosclerosis.  06-09-20: abdominal ultrasound:  1. Moderate gallbladder sludge with probable punctate stones. Slight  increased wall thickness with small amount of fluid adjacent to the gallbladder but negative sonographic Murphy. Findings are suggestive of but not definitive for cholecystitis; consider correlation with nuclear medicine hepatobiliary imaging. 6 mm gallbladder polyp. 2. Simple appearing renal cysts. 3. Incidental note made of pleural effusions.  06-12-20: HIDA:  1. No scintigraphic evidence of acute cholecystitis. 2. Findings compatible with severe biliary dyskinesia with little to no gallbladder emptying and elicitation of abdominal pain with the CCK administration.  06-13-20: chest x-ray:  Diffuse interstitial opacity with Awanda Mink lines with possible trace pleural fluid. Stable heart size and aortic contours. Negative for air leak  NO NEW EXAMS.    LABS REVIEWED PREVIOUS    03-10-20: wbc 11.2; hgb 11.3; hct 35.9; mcv 82.7 plt 188; glucose 160; bun 18; creat 1.21; k+ 3.3; na++ 139; ca 8.8 liver normal 3.0; mag 2.3 03-12-20: wbc 10.4; hgb 9.3; hct 29.6; mcv 84.3 plt 178; glucose 138; bun 34; creat 1.77; k+ 3.8; na++ 138; ca 8.3 03-14-20: glucose 134; bun 36; creat 1.23; k+ 3.2; na++ 139; ca 8.8 mag 2.1   03-16-20: hgb 10.7; hct 33.4 glucose 127; bun 18; creat 0.86 k+ 3.3; na++ 139; ca 9.2 ;liver normal albumin 2.5 iron 23; tibc 221; ferritin 137 03-23-20: k+ 3.0  03-27-20: glucose 133; bun 34; creat 0.85; k+ 3.6; na++ 142; ca 9.0; AM cortisol: 10.6  05-22-20: wbc 19.4; hgb 14.6; hct 43.8; mcv 81.6 plt 254; glucose 211; bun 35; creat 1.20; k+ 3.0; na++ 138; ca 9.8; liver normal albumin 3.5 mag 1.9; urine culture: <10,000; blood culture: no growth 05-25-20: wbc 14.2; hgb 20.8; hct 34.8; mcv 84.7 plt 212; glucose 109; bun 24; creat 0.86; k+ 3.4; na++ 137; ca 8.9; mag 1.9 05-28-20: wbc 10.9; hgb 11.8; hct 36.8; mcv 81.8 plt 227; glucose 132; bun 10; creat 0.82; k+ 3.0; na++ 137; ca 8.7 06-01-20: wbc 12.2;  hgb 12.5; hct 40.3; mcv 81.9 plt 136; glucose 136; bun 12; creat 0.81; k+ 3.0; na++ 140; ca 8.9 mag  1.9 06-05-20: wbc 13.6; hgb 10.2; hct 32.9; mcv 93.5 plt 266 glucose 125; bun 35; creat 1.26; k+ 3.5; na++ 137; ca 8.9  06-07-20: wbc 11.9; hgb 9.6; hct 29.8; mcv 82.8 plt 318; glucose 114; bun 42; creat 1.47; k+ 3.9; na++ 138; ca 8.8 GFR 46; liver normal albumin 2.1  06-08-20: glucose 107; bun 57; creat 1.81; k+ 4.3; na++ 135; ca 8.9 GFR 36 06-09-20: glucose 107; bun 52; creat 1.64; k+ 5.3; na++ 136; ca 9.3;  GFR 40; liver normal albumin 2.3 06-10-20: wbc 12.6; hgb 10.5; hct 34.2; mcv 83.4 plt 417; glucose 126; bun 39; creat 4.4; na++ 137; ca 9.5 GFR 58; liver normal albumin 2.2 06-12-20: wbc 8.2; hgb 10.4; hct 33.6; mcv 81.2 plt 364; glucose 123; bun 24; creat 0.87; k+ 3.5; na++ 138; GFR >60; liver normal albumin 1.9 06-13-20: urine culture: <10,000 colonies 06-14-20: wbc 9.0; hgb 10.5; hct 33.8; mcv 80.5 plt 267; glucose 122; bun 20; creat 0.97; k+3.3; na++ 140; ca 9.0 GFR >60 06-29-20: wbc 10.1; hgb 10.7; hct 36.1; mcv 83.0 plt 294; glucose 152; bun 26; creat 1.02; k+ 4.4; na++ 138 ca 8.8 GFR>60 07-24-20 PSA 16.0 08-14-20: glucose 110; bun 33; creat 1.00 ;k+ 2.5 na++ 140 08-15-20: glucose 114; bun 31; creat  0.84; k+ 3.1; na++ 142 08-21-20: k+ 3.5 10-04-20: albumin 3.2  10-19-20: PSA 12.25 (13.05)  12-21-20: wbc 4.6; hgb 13.4 hct 40.9 mcv 86.1 plt 223; glucose 104; bun 22; creat 0.89; k+ 3.0; na++ 143; ca 9.3 GFR>60; liver normal albumin 3.3; chol 128; ldl 71; trig 85 hdl 40   12-25-20: k+ 3.2 01-01-21: k+ 3.2 01-08-21: k+ 4.5 01-19-21: glucose 108; bun 20; creat 0.96; k+ 3.6; na++ 139; ca 9.0 GFR>60 02-05-21: glucose 109; bun 25; creat 1.05; k+ 4.5; na++ 141; ca 9.5; GFR>60.  03-13-21: wbc 7.3; hgb 14.0; hct 42.5; mcv 85.5 plt 212; glucose 126; bun 24; creat 1.03 ;k+ 3.9; na++ 139; ca 9.6; GFR>60 03-15-21: hgb a1c 5.7 03-20-21: wbc 20.3; hgb 14.6; hct 44.4; mcv 86.7 plt 229; glcusoe 201; bun 26; creat 1.44; k+ 4.2; na++ 138; ca 9.2; GFR 46  03-22-21: aldosterone 7.6; cortisol AM 19.5;  03-26-21: wbc 6.3; hgb  12.4; hct 37.9; mcv 87.5 plt 230; glucose 117; bun 28; creat 1.13; k+ 4.4; na++ 138; ca 9.2; GFR>60   NO NEW LABS.   Review of Systems  Constitutional:  Negative for malaise/fatigue.  Respiratory:  Negative for cough and shortness of breath.   Cardiovascular:  Negative for chest pain, palpitations and leg swelling.  Gastrointestinal:  Negative for abdominal pain, constipation and heartburn.  Musculoskeletal:  Negative for back pain, joint pain and myalgias.  Skin: Negative.   Neurological:  Negative for dizziness.  Psychiatric/Behavioral:  Positive for depression. Negative for suicidal ideas. The patient is nervous/anxious and has insomnia.     Physical Exam Constitutional:      General: He is not in acute distress.    Appearance: He is well-developed. He is not diaphoretic.  Neck:     Thyroid: No thyromegaly.  Cardiovascular:     Rate and Rhythm: Normal rate and regular rhythm.     Pulses: Normal pulses.     Heart sounds: Normal heart sounds.  Pulmonary:     Effort: Pulmonary effort is normal. No respiratory distress.     Breath sounds: Normal breath sounds.  Abdominal:  General: Bowel sounds are normal. There is no distension.     Palpations: Abdomen is soft.     Tenderness: There is no abdominal tenderness.  Musculoskeletal:        General: Normal range of motion.     Cervical back: Neck supple.     Right lower leg: No edema.     Left lower leg: No edema.  Lymphadenopathy:     Cervical: No cervical adenopathy.  Skin:    General: Skin is warm and dry.  Neurological:     Mental Status: He is alert and oriented to person, place, and time.  Psychiatric:        Mood and Affect: Mood normal.     ASSESSMENT/ PLAN:  TODAY  Major depression recurrent chronic: is without change; at this time will not make further changes in his medications. He has been on seroquel for only several days; it is too soon to add medication. If his status does not improve over the next 1-2  weeks; will add lamictal at that time.     Ok Edwards NP [facility]  Contact [phone] Monday through Friday 8am- 5pm  After hours call [phone]

## 2021-04-03 ENCOUNTER — Encounter: Payer: Self-pay | Admitting: Adult Health

## 2021-04-03 ENCOUNTER — Non-Acute Institutional Stay (SKILLED_NURSING_FACILITY): Payer: Medicare Other | Admitting: Adult Health

## 2021-04-03 DIAGNOSIS — R262 Difficulty in walking, not elsewhere classified: Secondary | ICD-10-CM | POA: Diagnosis not present

## 2021-04-03 DIAGNOSIS — Z23 Encounter for immunization: Secondary | ICD-10-CM | POA: Diagnosis not present

## 2021-04-03 DIAGNOSIS — F339 Major depressive disorder, recurrent, unspecified: Secondary | ICD-10-CM | POA: Diagnosis not present

## 2021-04-03 DIAGNOSIS — Z9181 History of falling: Secondary | ICD-10-CM | POA: Diagnosis not present

## 2021-04-03 DIAGNOSIS — M6281 Muscle weakness (generalized): Secondary | ICD-10-CM | POA: Diagnosis not present

## 2021-04-03 DIAGNOSIS — R2681 Unsteadiness on feet: Secondary | ICD-10-CM | POA: Diagnosis not present

## 2021-04-03 DIAGNOSIS — Z472 Encounter for removal of internal fixation device: Secondary | ICD-10-CM | POA: Diagnosis not present

## 2021-04-03 DIAGNOSIS — Z48815 Encounter for surgical aftercare following surgery on the digestive system: Secondary | ICD-10-CM | POA: Diagnosis not present

## 2021-04-03 NOTE — Addendum Note (Signed)
Addended by: Logan Bores on: 04/03/2021 01:34 PM   Modules accepted: Orders

## 2021-04-03 NOTE — Progress Notes (Signed)
Location:  [facility] Room Number: 156 Place of Service:  SNF (31)   CODE STATUS: dnr   Allergies  Allergen Reactions   Hctz [Hydrochlorothiazide]     hypokalemia    Chief Complaint  Patient presents with   Acute Visit    Worsening depression     HPI:  Today he has stated that he is no longer going to take his potassium. When asked why; he states that he wants to die and that this will cause him to have a heart attack and die. When questioned about his blood pressure medications he states that he take them. He does not want a stroke; a heart attack is what will kill him. When asked about any other plans to kill himself he stated no that he is a chicken about doing that.   Past Medical History:  Diagnosis Date   Anxiety    Cancer [facility])    prostate   Chronic chest wall pain    Essential hypertension, benign 06/20/2016   GAD (generalized anxiety disorder)    GERD (gastroesophageal reflux disease)    H/O echocardiogram 2016   normal   Heart murmur    "leaking heart valve"   High cholesterol 06/20/2016   HOH (hard of hearing)    left   Hx of falling    Interstitial pulmonary disease (HCC)    Lower GI bleed    MDD (major depressive disorder)    Murmur, cardiac    Poor historian    PUD (peptic ulcer disease)    RLS (restless legs syndrome)    Shortness of breath    Wrist fracture 12/16/2011    Past Surgical History:  Procedure Laterality Date   CHOLECYSTECTOMY N/A 06/28/2020   Procedure: LAPAROSCOPIC CHOLECYSTECTOMY;  Surgeon: [name], MD;  Location: [facility] ORS;  Service: General;  Laterality: N/A;  pt in [place]  2005   Rehman: Sigmoid colon diverticulosis, moderate sized external hemorrhoids. A few focal areas of mucosal erythema felt to be nonspecific.   ESOPHAGOGASTRODUODENOSCOPY     ESOPHAGOGASTRODUODENOSCOPY N/A 07/18/2016   Rehman: normal exam except duodenal scar. h.pylori serologies negative   FLEXIBLE  SIGMOIDOSCOPY N/A 06/11/2020   Procedure: FLEXIBLE SIGMOIDOSCOPY;  Surgeon: Eloise Harman, DO;  Location: [facility] SUITE;  Service: Endoscopy;  Laterality: N/A;   HARDWARE REMOVAL Right 03/20/2021   Procedure: HARDWARE REMOVAL right hip;  Surgeon: Carole Civil, MD;  Location: [facility] ORS;  Service: Orthopedics;  Laterality: Right;   HIP PINNING,CANNULATED Right 03/10/2020   Procedure: INTERNAL FIXATION RIGHT HIP;  Surgeon: Carole Civil, MD;  Location: [facility];  Service: Orthopedics;  Laterality: Right;   ORIF WRIST FRACTURE  12/19/2011   Procedure: OPEN REDUCTION INTERNAL FIXATION (ORIF) WRIST FRACTURE;  Surgeon: Carole Civil, MD;  Location: [facility];  Service: Orthopedics;  Laterality: Right;   prostate cancer     Diagnosed in the 90s   TONSILLECTOMY      Social History   Socioeconomic History   Marital status: Widowed    Spouse name: Not on file   Number of children: Not on file   Years of education: Not on file   Highest education level: Not on file  Occupational History   Occupation: retired    Fish farm manager: RETIRED  Tobacco Use   Smoking status: Never   Smokeless tobacco: Never  Vaping Use   Vaping Use: Never used  Substance and Sexual Activity   Alcohol use:  No   Drug use: No   Sexual activity: Not Currently  Other Topics Concern   Not on file  Social History Narrative   Is long term resident of [facility]    Social Determinants of Health   Financial Resource Strain: Not on file  Food Insecurity: Not on file  Transportation Needs: Not on file  Physical Activity: Not on file  Stress: Not on file  Social Connections: Not on file  Intimate Partner Violence: Not on file   Family History  Problem Relation Age of Onset   Cancer Other    Heart attack Mother    Cancer Brother    Heart attack Brother    Heart disease Sister        by pass      VITAL SIGNS BP 136/72   Pulse 100   Temp 98 F (36.7 C)   Resp 16   Ht 5\' 9"  (1.753 m)   Wt 173 lb 3.2 oz (78.6  kg)   BMI 25.58 kg/m   Outpatient Encounter Medications as of 04/03/2021  Medication Sig Note   acetaminophen (TYLENOL) 500 MG tablet Take 500 mg by mouth every 6 (six) hours. Max 3000mg  in 24 hour period    acetaminophen-codeine (TYLENOL #3) 300-30 MG tablet Take 1 tablet by mouth every 6 (six) hours as needed for moderate pain.    alfuzosin (UROXATRAL) 10 MG 24 hr tablet Take 10 mg by mouth daily with breakfast.    antiseptic oral rinse (BIOTENE) LIQD 10 mLs by Mouth Rinse route in the morning and at bedtime.    Ascorbic Acid (VITAMIN C) 1000 MG tablet Take 1,000 mg by mouth daily.    aspirin 81 MG chewable tablet Chew 1 tablet (81 mg total) by mouth daily with breakfast.    Balsam [place]-Castor Oil (VENELEX) OINT Apply 1 application topically every 8 (eight) hours as needed (Apply to bilateral buttocks & sacral area qshift for blanchable erythema.).    calcium carbonate (TUMS - DOSED IN MG ELEMENTAL CALCIUM) 500 MG chewable tablet Chew 1 tablet by mouth 2 (two) times daily as needed for indigestion or heartburn.     diltiazem (CARDIZEM CD) 360 MG 24 hr capsule Take 1 capsule (360 mg total) by mouth daily.    ferrous sulfate 325 (65 FE) MG tablet Take 325 mg by mouth daily with breakfast. For Anemia and  Iron Deficiency    Flax Oil-Fish Oil-Borage Oil (FISH OIL-FLAX OIL-BORAGE OIL) CAPS Take 1 capsule by mouth daily.    hydrochlorothiazide (MICROZIDE) 12.5 MG capsule Take 12.5 mg by mouth daily.    ipratropium-albuterol (DUONEB) 0.5-2.5 (3) MG/3ML SOLN Take 3 mLs by nebulization every 6 (six) hours as needed (shortness of breath or wheezing).    isosorbide mononitrate (IMDUR) 30 MG 24 hr tablet Take 1 tablet (30 mg total) by mouth daily.    lisinopril (ZESTRIL) 40 MG tablet Take 1 tablet (40 mg total) by mouth daily.    loperamide (IMODIUM A-D) 2 MG tablet Take 2 mg by mouth as needed (after each stool. (do not exceed more than 4 tabs in 24 hours)).    Melatonin 10 MG TABS Take 10 mg by mouth  at bedtime.    meloxicam (MOBIC) 7.5 MG tablet Take 7.5 mg by mouth daily. give for left hip pain    memantine (NAMENDA) 10 MG tablet Take 10 mg by mouth 2 (two) times daily.    methocarbamol (ROBAXIN) 500 MG tablet Take 500 mg by mouth daily.  metoprolol tartrate (LOPRESSOR) 25 MG tablet Take 1 tablet (25 mg total) by mouth 2 (two) times daily.    omeprazole (PRILOSEC) 20 MG capsule Take 1 capsule (20 mg total) by mouth daily before breakfast.    ondansetron (ZOFRAN) 8 MG tablet Take 8 mg by mouth every 8 (eight) hours as needed for nausea.    potassium chloride SA (KLOR-CON) 20 MEQ tablet Take 40 mEq by mouth 3 (three) times daily. 03/09/2021: Long term regimen. No duration specified.   QUEtiapine (SEROQUEL) 25 MG tablet Take 12.5 mg by mouth at bedtime.    rOPINIRole (REQUIP) 0.25 MG tablet Take 0.25 mg by mouth at bedtime.    senna-docusate (SENOKOT-S) 8.6-50 MG tablet Take 1 tablet by mouth at bedtime as needed for mild constipation.    simethicone (MYLICON) 259 MG chewable tablet Chew 125 mg by mouth 3 (three) times daily as needed ("increased gas").    spironolactone (ALDACTONE) 25 MG tablet Take 25 mg by mouth daily.    venlafaxine XR (EFFEXOR-XR) 150 MG 24 hr capsule Take 150 mg by mouth daily with breakfast.    No facility-administered encounter medications on file as of 04/03/2021.     SIGNIFICANT DIAGNOSTIC EXAMS   PREVIOUS   06-09-20: ct of abdomen and pelvis:  Cholelithiasis. Haziness noted around the gallbladder. Cannot exclude acute cholecystitis. Continued wall thickening in the left colon from the distal transverse colon through the distal descending colon concerning for colitis. Left colonic diverticulosis. Small bilateral pleural effusions, increasing since prior study. Bibasilar atelectasis. Coronary artery disease, aortic atherosclerosis.  06-09-20: abdominal ultrasound:  1. Moderate gallbladder sludge with probable punctate stones. Slight increased wall  thickness with small amount of fluid adjacent to the gallbladder but negative sonographic Murphy. Findings are suggestive of but not definitive for cholecystitis; consider correlation with nuclear medicine hepatobiliary imaging. 6 mm gallbladder polyp. 2. Simple appearing renal cysts. 3. Incidental note made of pleural effusions.  06-12-20: HIDA:  1. No scintigraphic evidence of acute cholecystitis. 2. Findings compatible with severe biliary dyskinesia with little to no gallbladder emptying and elicitation of abdominal pain with the CCK administration.  06-13-20: chest x-ray:  Diffuse interstitial opacity with Awanda Mink lines with possible trace pleural fluid. Stable heart size and aortic contours. Negative for air leak  NO NEW EXAMS.    LABS REVIEWED PREVIOUS    03-10-20: wbc 11.2; hgb 11.3; hct 35.9; mcv 82.7 plt 188; glucose 160; bun 18; creat 1.21; k+ 3.3; na++ 139; ca 8.8 liver normal 3.0; mag 2.3 03-12-20: wbc 10.4; hgb 9.3; hct 29.6; mcv 84.3 plt 178; glucose 138; bun 34; creat 1.77; k+ 3.8; na++ 138; ca 8.3 03-14-20: glucose 134; bun 36; creat 1.23; k+ 3.2; na++ 139; ca 8.8 mag 2.1   03-16-20: hgb 10.7; hct 33.4 glucose 127; bun 18; creat 0.86 k+ 3.3; na++ 139; ca 9.2 ;liver normal albumin 2.5 iron 23; tibc 221; ferritin 137 03-23-20: k+ 3.0  03-27-20: glucose 133; bun 34; creat 0.85; k+ 3.6; na++ 142; ca 9.0; AM cortisol: 10.6  05-22-20: wbc 19.4; hgb 14.6; hct 43.8; mcv 81.6 plt 254; glucose 211; bun 35; creat 1.20; k+ 3.0; na++ 138; ca 9.8; liver normal albumin 3.5 mag 1.9; urine culture: <10,000; blood culture: no growth 05-25-20: wbc 14.2; hgb 20.8; hct 34.8; mcv 84.7 plt 212; glucose 109; bun 24; creat 0.86; k+ 3.4; na++ 137; ca 8.9; mag 1.9 05-28-20: wbc 10.9; hgb 11.8; hct 36.8; mcv 81.8 plt 227; glucose 132; bun 10; creat 0.82; k+ 3.0; na++ 137;  ca 8.7 06-01-20: wbc 12.2; hgb 12.5; hct 40.3; mcv 81.9 plt 136; glucose 136; bun 12; creat 0.81; k+ 3.0; na++ 140; ca 8.9 mag 1.9 06-05-20:  wbc 13.6; hgb 10.2; hct 32.9; mcv 93.5 plt 266 glucose 125; bun 35; creat 1.26; k+ 3.5; na++ 137; ca 8.9  06-07-20: wbc 11.9; hgb 9.6; hct 29.8; mcv 82.8 plt 318; glucose 114; bun 42; creat 1.47; k+ 3.9; na++ 138; ca 8.8 GFR 46; liver normal albumin 2.1  06-08-20: glucose 107; bun 57; creat 1.81; k+ 4.3; na++ 135; ca 8.9 GFR 36 06-09-20: glucose 107; bun 52; creat 1.64; k+ 5.3; na++ 136; ca 9.3;  GFR 40; liver normal albumin 2.3 06-10-20: wbc 12.6; hgb 10.5; hct 34.2; mcv 83.4 plt 417; glucose 126; bun 39; creat 4.4; na++ 137; ca 9.5 GFR 58; liver normal albumin 2.2 06-12-20: wbc 8.2; hgb 10.4; hct 33.6; mcv 81.2 plt 364; glucose 123; bun 24; creat 0.87; k+ 3.5; na++ 138; GFR >60; liver normal albumin 1.9 06-13-20: urine culture: <10,000 colonies 06-14-20: wbc 9.0; hgb 10.5; hct 33.8; mcv 80.5 plt 267; glucose 122; bun 20; creat 0.97; k+3.3; na++ 140; ca 9.0 GFR >60 06-29-20: wbc 10.1; hgb 10.7; hct 36.1; mcv 83.0 plt 294; glucose 152; bun 26; creat 1.02; k+ 4.4; na++ 138 ca 8.8 GFR>60 07-24-20 PSA 16.0 08-14-20: glucose 110; bun 33; creat 1.00 ;k+ 2.5 na++ 140 08-15-20: glucose 114; bun 31; creat  0.84; k+ 3.1; na++ 142 08-21-20: k+ 3.5 10-04-20: albumin 3.2  10-19-20: PSA 12.25 (13.05)  12-21-20: wbc 4.6; hgb 13.4 hct 40.9 mcv 86.1 plt 223; glucose 104; bun 22; creat 0.89; k+ 3.0; na++ 143; ca 9.3 GFR>60; liver normal albumin 3.3; chol 128; ldl 71; trig 85 hdl 40   12-25-20: k+ 3.2 01-01-21: k+ 3.2 01-08-21: k+ 4.5 01-19-21: glucose 108; bun 20; creat 0.96; k+ 3.6; na++ 139; ca 9.0 GFR>60 02-05-21: glucose 109; bun 25; creat 1.05; k+ 4.5; na++ 141; ca 9.5; GFR>60.  03-13-21: wbc 7.3; hgb 14.0; hct 42.5; mcv 85.5 plt 212; glucose 126; bun 24; creat 1.03 ;k+ 3.9; na++ 139; ca 9.6; GFR>60 03-15-21: hgb a1c 5.7 03-20-21: wbc 20.3; hgb 14.6; hct 44.4; mcv 86.7 plt 229; glcusoe 201; bun 26; creat 1.44; k+ 4.2; na++ 138; ca 9.2; GFR 46  03-22-21: aldosterone 7.6; cortisol AM 19.5;  03-26-21: wbc 6.3; hgb 12.4; hct 37.9;  mcv 87.5 plt 230; glucose 117; bun 28; creat 1.13; k+ 4.4; na++ 138; ca 9.2; GFR>60   NO NEW LABS.   Review of Systems  Constitutional:  Negative for malaise/fatigue.  Respiratory:  Negative for cough and shortness of breath.   Cardiovascular:  Negative for chest pain, palpitations and leg swelling.  Gastrointestinal:  Negative for abdominal pain, constipation and heartburn.  Musculoskeletal:  Negative for back pain, joint pain and myalgias.  Skin: Negative.   Neurological:  Negative for dizziness.  Psychiatric/Behavioral:  Positive for depression and suicidal ideas. The patient is nervous/anxious and has insomnia.        Wants to have a heart attack     Physical Exam Constitutional:      General: He is not in acute distress.    Appearance: He is well-developed. He is not diaphoretic.  Neck:     Thyroid: No thyromegaly.  Cardiovascular:     Rate and Rhythm: Normal rate and regular rhythm.     Pulses: Normal pulses.     Heart sounds: Normal heart sounds.  Pulmonary:     Effort: Pulmonary effort  is normal. No respiratory distress.     Breath sounds: Normal breath sounds.  Abdominal:     General: Bowel sounds are normal. There is no distension.     Palpations: Abdomen is soft.     Tenderness: There is no abdominal tenderness.  Musculoskeletal:        General: Normal range of motion.     Cervical back: Neck supple.     Right lower leg: No edema.     Left lower leg: No edema.  Lymphadenopathy:     Cervical: No cervical adenopathy.  Skin:    General: Skin is warm and dry.  Neurological:     Mental Status: He is alert and oriented to person, place, and time.  Psychiatric:     Comments: Laying in bed facing away from door      ASSESSMENT/ PLAN:  Major depression recurrent chronic   His status is worse. He is expressing suicidal ideation; he requires more advanced care. He is willing to go to hospital for inpatient care.    Ok Edwards NP [facility]   Contact [phone] Monday through Friday 8am- 5pm  After hours call [phone]

## 2021-04-04 ENCOUNTER — Inpatient Hospital Stay: Payer: Medicare Other

## 2021-04-04 ENCOUNTER — Encounter: Payer: Self-pay | Admitting: Orthopedic Surgery

## 2021-04-04 ENCOUNTER — Ambulatory Visit (INDEPENDENT_AMBULATORY_CARE_PROVIDER_SITE_OTHER): Payer: Medicare Other | Admitting: Orthopedic Surgery

## 2021-04-04 DIAGNOSIS — S72001A Fracture of unspecified part of neck of right femur, initial encounter for closed fracture: Secondary | ICD-10-CM

## 2021-04-04 DIAGNOSIS — R262 Difficulty in walking, not elsewhere classified: Secondary | ICD-10-CM | POA: Diagnosis not present

## 2021-04-04 DIAGNOSIS — Z9181 History of falling: Secondary | ICD-10-CM | POA: Diagnosis not present

## 2021-04-04 DIAGNOSIS — Z1159 Encounter for screening for other viral diseases: Secondary | ICD-10-CM | POA: Diagnosis not present

## 2021-04-04 DIAGNOSIS — M6281 Muscle weakness (generalized): Secondary | ICD-10-CM | POA: Diagnosis not present

## 2021-04-04 DIAGNOSIS — K529 Noninfective gastroenteritis and colitis, unspecified: Secondary | ICD-10-CM | POA: Diagnosis not present

## 2021-04-04 DIAGNOSIS — F331 Major depressive disorder, recurrent, moderate: Secondary | ICD-10-CM | POA: Diagnosis not present

## 2021-04-04 DIAGNOSIS — Z472 Encounter for removal of internal fixation device: Secondary | ICD-10-CM | POA: Diagnosis not present

## 2021-04-04 DIAGNOSIS — Z48815 Encounter for surgical aftercare following surgery on the digestive system: Secondary | ICD-10-CM | POA: Diagnosis not present

## 2021-04-04 DIAGNOSIS — R2681 Unsteadiness on feet: Secondary | ICD-10-CM | POA: Diagnosis not present

## 2021-04-04 NOTE — Progress Notes (Signed)
Chief Complaint  Patient presents with   Post-op Follow-up    Hardware removal right hip 03/20/21 states still painful not able to ambulate    87 year old male status post hardware removal right hip status post ORIF right hip for right hip femoral neck fracture now says he cannot walk his family is with him today.  He is becoming more belligerent at the nursing home and he is threatened suicide  His wound looks good is clean he can move his leg fine I took an x-ray there is no fracture the hardware was taken out that looks fine  Recommend he start aggressive physical therapy regain ambulation.  Follow-up as needed

## 2021-04-05 DIAGNOSIS — M6281 Muscle weakness (generalized): Secondary | ICD-10-CM | POA: Diagnosis not present

## 2021-04-05 DIAGNOSIS — Z9181 History of falling: Secondary | ICD-10-CM | POA: Diagnosis not present

## 2021-04-05 DIAGNOSIS — Z48815 Encounter for surgical aftercare following surgery on the digestive system: Secondary | ICD-10-CM | POA: Diagnosis not present

## 2021-04-05 DIAGNOSIS — Z472 Encounter for removal of internal fixation device: Secondary | ICD-10-CM | POA: Diagnosis not present

## 2021-04-05 DIAGNOSIS — R2681 Unsteadiness on feet: Secondary | ICD-10-CM | POA: Diagnosis not present

## 2021-04-05 DIAGNOSIS — R262 Difficulty in walking, not elsewhere classified: Secondary | ICD-10-CM | POA: Diagnosis not present

## 2021-04-06 DIAGNOSIS — M6281 Muscle weakness (generalized): Secondary | ICD-10-CM | POA: Diagnosis not present

## 2021-04-06 DIAGNOSIS — R2681 Unsteadiness on feet: Secondary | ICD-10-CM | POA: Diagnosis not present

## 2021-04-06 DIAGNOSIS — Z472 Encounter for removal of internal fixation device: Secondary | ICD-10-CM | POA: Diagnosis not present

## 2021-04-06 DIAGNOSIS — Z9181 History of falling: Secondary | ICD-10-CM | POA: Diagnosis not present

## 2021-04-06 DIAGNOSIS — R262 Difficulty in walking, not elsewhere classified: Secondary | ICD-10-CM | POA: Diagnosis not present

## 2021-04-06 DIAGNOSIS — Z48815 Encounter for surgical aftercare following surgery on the digestive system: Secondary | ICD-10-CM | POA: Diagnosis not present

## 2021-04-07 DIAGNOSIS — Z472 Encounter for removal of internal fixation device: Secondary | ICD-10-CM | POA: Diagnosis not present

## 2021-04-07 DIAGNOSIS — Z9181 History of falling: Secondary | ICD-10-CM | POA: Diagnosis not present

## 2021-04-07 DIAGNOSIS — R262 Difficulty in walking, not elsewhere classified: Secondary | ICD-10-CM | POA: Diagnosis not present

## 2021-04-07 DIAGNOSIS — Z48815 Encounter for surgical aftercare following surgery on the digestive system: Secondary | ICD-10-CM | POA: Diagnosis not present

## 2021-04-07 DIAGNOSIS — M6281 Muscle weakness (generalized): Secondary | ICD-10-CM | POA: Diagnosis not present

## 2021-04-07 DIAGNOSIS — R2681 Unsteadiness on feet: Secondary | ICD-10-CM | POA: Diagnosis not present

## 2021-04-09 DIAGNOSIS — R2681 Unsteadiness on feet: Secondary | ICD-10-CM | POA: Diagnosis not present

## 2021-04-09 DIAGNOSIS — Z472 Encounter for removal of internal fixation device: Secondary | ICD-10-CM | POA: Diagnosis not present

## 2021-04-09 DIAGNOSIS — Z48815 Encounter for surgical aftercare following surgery on the digestive system: Secondary | ICD-10-CM | POA: Diagnosis not present

## 2021-04-09 DIAGNOSIS — M6281 Muscle weakness (generalized): Secondary | ICD-10-CM | POA: Diagnosis not present

## 2021-04-09 DIAGNOSIS — R262 Difficulty in walking, not elsewhere classified: Secondary | ICD-10-CM | POA: Diagnosis not present

## 2021-04-09 DIAGNOSIS — Z9181 History of falling: Secondary | ICD-10-CM | POA: Diagnosis not present

## 2021-04-10 DIAGNOSIS — Z9181 History of falling: Secondary | ICD-10-CM | POA: Diagnosis not present

## 2021-04-10 DIAGNOSIS — R41841 Cognitive communication deficit: Secondary | ICD-10-CM | POA: Diagnosis not present

## 2021-04-10 DIAGNOSIS — R2681 Unsteadiness on feet: Secondary | ICD-10-CM | POA: Diagnosis not present

## 2021-04-10 DIAGNOSIS — Z48815 Encounter for surgical aftercare following surgery on the digestive system: Secondary | ICD-10-CM | POA: Diagnosis not present

## 2021-04-10 DIAGNOSIS — R262 Difficulty in walking, not elsewhere classified: Secondary | ICD-10-CM | POA: Diagnosis not present

## 2021-04-10 DIAGNOSIS — Z741 Need for assistance with personal care: Secondary | ICD-10-CM | POA: Diagnosis not present

## 2021-04-10 DIAGNOSIS — M6281 Muscle weakness (generalized): Secondary | ICD-10-CM | POA: Diagnosis not present

## 2021-04-10 DIAGNOSIS — Z472 Encounter for removal of internal fixation device: Secondary | ICD-10-CM | POA: Diagnosis not present

## 2021-04-11 DIAGNOSIS — M6281 Muscle weakness (generalized): Secondary | ICD-10-CM | POA: Diagnosis not present

## 2021-04-11 DIAGNOSIS — F331 Major depressive disorder, recurrent, moderate: Secondary | ICD-10-CM | POA: Diagnosis not present

## 2021-04-11 DIAGNOSIS — Z48815 Encounter for surgical aftercare following surgery on the digestive system: Secondary | ICD-10-CM | POA: Diagnosis not present

## 2021-04-11 DIAGNOSIS — Z472 Encounter for removal of internal fixation device: Secondary | ICD-10-CM | POA: Diagnosis not present

## 2021-04-11 DIAGNOSIS — Z9181 History of falling: Secondary | ICD-10-CM | POA: Diagnosis not present

## 2021-04-11 DIAGNOSIS — R2681 Unsteadiness on feet: Secondary | ICD-10-CM | POA: Diagnosis not present

## 2021-04-11 DIAGNOSIS — R262 Difficulty in walking, not elsewhere classified: Secondary | ICD-10-CM | POA: Diagnosis not present

## 2021-04-12 DIAGNOSIS — M6281 Muscle weakness (generalized): Secondary | ICD-10-CM | POA: Diagnosis not present

## 2021-04-12 DIAGNOSIS — Z472 Encounter for removal of internal fixation device: Secondary | ICD-10-CM | POA: Diagnosis not present

## 2021-04-12 DIAGNOSIS — R262 Difficulty in walking, not elsewhere classified: Secondary | ICD-10-CM | POA: Diagnosis not present

## 2021-04-12 DIAGNOSIS — Z9181 History of falling: Secondary | ICD-10-CM | POA: Diagnosis not present

## 2021-04-12 DIAGNOSIS — R2681 Unsteadiness on feet: Secondary | ICD-10-CM | POA: Diagnosis not present

## 2021-04-12 DIAGNOSIS — Z48815 Encounter for surgical aftercare following surgery on the digestive system: Secondary | ICD-10-CM | POA: Diagnosis not present

## 2021-04-13 ENCOUNTER — Encounter: Payer: Self-pay | Admitting: Adult Health

## 2021-04-13 ENCOUNTER — Non-Acute Institutional Stay (SKILLED_NURSING_FACILITY): Payer: Medicare Other | Admitting: Adult Health

## 2021-04-13 DIAGNOSIS — I700 Atherosclerosis of aorta: Secondary | ICD-10-CM

## 2021-04-13 DIAGNOSIS — M6281 Muscle weakness (generalized): Secondary | ICD-10-CM | POA: Diagnosis not present

## 2021-04-13 DIAGNOSIS — J849 Interstitial pulmonary disease, unspecified: Secondary | ICD-10-CM

## 2021-04-13 DIAGNOSIS — F0390 Unspecified dementia without behavioral disturbance: Secondary | ICD-10-CM | POA: Diagnosis not present

## 2021-04-13 DIAGNOSIS — R2681 Unsteadiness on feet: Secondary | ICD-10-CM | POA: Diagnosis not present

## 2021-04-13 DIAGNOSIS — F339 Major depressive disorder, recurrent, unspecified: Secondary | ICD-10-CM | POA: Diagnosis not present

## 2021-04-13 DIAGNOSIS — Z9181 History of falling: Secondary | ICD-10-CM | POA: Diagnosis not present

## 2021-04-13 DIAGNOSIS — Z48815 Encounter for surgical aftercare following surgery on the digestive system: Secondary | ICD-10-CM | POA: Diagnosis not present

## 2021-04-13 DIAGNOSIS — R262 Difficulty in walking, not elsewhere classified: Secondary | ICD-10-CM | POA: Diagnosis not present

## 2021-04-13 DIAGNOSIS — Z472 Encounter for removal of internal fixation device: Secondary | ICD-10-CM | POA: Diagnosis not present

## 2021-04-13 NOTE — Progress Notes (Signed)
Location:  [place] Room Number: 156 Place of Service:  SNF (31)   CODE STATUS: dnr   Allergies  Allergen Reactions   Hctz [Hydrochlorothiazide]     hypokalemia    Chief Complaint  Patient presents with   Acute Visit    Care plan meeting.     HPI:  We have come together for his care plan meeting. BIMS 15/15 mood 3/30: decreased energy. He has had suicidal ideation. He tells me that since his seroquel was adjusted he is feeling much better; that his mind is clear and there are no voices in his head. He has had one fall without injury. He is supervision to limited assist with his adls is occasionally incontinent of bladder and bowel. He is nonambulatory. Dietary: feeds self weight is stable. Therapy: PT standing and ambulation; OT adls; ST cognition is improving. He continues to be followed for his chronic illnesses including:   Aortic atherosclerosis   Dementia without behavioral disturbance   Major depression recurrent chronic  Interstitial lung disease  Past Medical History:  Diagnosis Date   Anxiety    Cancer [facility])    prostate   Chronic chest wall pain    Essential hypertension, benign 06/20/2016   GAD (generalized anxiety disorder)    GERD (gastroesophageal reflux disease)    H/O echocardiogram 2016   normal   Heart murmur    "leaking heart valve"   High cholesterol 06/20/2016   HOH (hard of hearing)    left   Hx of falling    Interstitial pulmonary disease (HCC)    Lower GI bleed    MDD (major depressive disorder)    Murmur, cardiac    Poor historian    PUD (peptic ulcer disease)    RLS (restless legs syndrome)    Shortness of breath    Wrist fracture 12/16/2011    Past Surgical History:  Procedure Laterality Date   CHOLECYSTECTOMY N/A 06/28/2020   Procedure: LAPAROSCOPIC CHOLECYSTECTOMY;  Surgeon: [name], MD;  Location: [facility] ORS;  Service: General;  Laterality: N/A;  pt in [place]  2005   Rehman:  Sigmoid colon diverticulosis, moderate sized external hemorrhoids. A few focal areas of mucosal erythema felt to be nonspecific.   ESOPHAGOGASTRODUODENOSCOPY     ESOPHAGOGASTRODUODENOSCOPY N/A 07/18/2016   Rehman: normal exam except duodenal scar. h.pylori serologies negative   FLEXIBLE SIGMOIDOSCOPY N/A 06/11/2020   Procedure: FLEXIBLE SIGMOIDOSCOPY;  Surgeon: Eloise Harman, DO;  Location: [facility] ENDO SUITE;  Service: Endoscopy;  Laterality: N/A;   HARDWARE REMOVAL Right 03/20/2021   Procedure: HARDWARE REMOVAL right hip;  Surgeon: Carole Civil, MD;  Location: [facility] ORS;  Service: Orthopedics;  Laterality: Right;   HIP PINNING,CANNULATED Right 03/10/2020   Procedure: INTERNAL FIXATION RIGHT HIP;  Surgeon: Carole Civil, MD;  Location: [facility] ORS;  Service: Orthopedics;  Laterality: Right;   ORIF WRIST FRACTURE  12/19/2011   Procedure: OPEN REDUCTION INTERNAL FIXATION (ORIF) WRIST FRACTURE;  Surgeon: Carole Civil, MD;  Location: [facility] ORS;  Service: Orthopedics;  Laterality: Right;   prostate cancer     Diagnosed in the 90s   TONSILLECTOMY      Social History   Socioeconomic History   Marital status: Widowed    Spouse name: Not on file   Number of children: Not on file   Years of education: Not on file   Highest education level: Not on file  Occupational History   Occupation: retired  Employer: RETIRED  Tobacco Use   Smoking status: Never   Smokeless tobacco: Never  Vaping Use   Vaping Use: Never used  Substance and Sexual Activity   Alcohol use: No   Drug use: No   Sexual activity: Not Currently  Other Topics Concern   Not on file  Social History Narrative   Is long term resident of [facility]    Social Determinants of Health   Financial Resource Strain: Not on file  Food Insecurity: Not on file  Transportation Needs: Not on file  Physical Activity: Not on file  Stress: Not on file  Social Connections: Not on file  Intimate Partner Violence: Not on file   Family  History  Problem Relation Age of Onset   Cancer Other    Heart attack Mother    Cancer Brother    Heart attack Brother    Heart disease Sister        by pass      VITAL SIGNS BP 133/69   Pulse 65   Temp 97.7 F (36.5 C)   Resp 18   Ht 5\' 9"  (1.753 m)   Wt 172 lb 6.4 oz (78.2 kg)   BMI 25.46 kg/m   Outpatient Encounter Medications as of 04/13/2021  Medication Sig Note   acetaminophen (TYLENOL) 500 MG tablet Take 500 mg by mouth every 6 (six) hours. Max 3000mg  in 24 hour period    alfuzosin (UROXATRAL) 10 MG 24 hr tablet Take 10 mg by mouth daily with breakfast.    antiseptic oral rinse (BIOTENE) LIQD 10 mLs by Mouth Rinse route in the morning and at bedtime.    Ascorbic Acid (VITAMIN C) 1000 MG tablet Take 1,000 mg by mouth daily.    Balsam [place]-Castor Oil (VENELEX) OINT Apply 1 application topically every 8 (eight) hours as needed (Apply to bilateral buttocks & sacral area qshift for blanchable erythema.).    calcium carbonate (TUMS - DOSED IN MG ELEMENTAL CALCIUM) 500 MG chewable tablet Chew 1 tablet by mouth 2 (two) times daily as needed for indigestion or heartburn.     diltiazem (CARDIZEM CD) 360 MG 24 hr capsule Take 1 capsule (360 mg total) by mouth daily.    ferrous sulfate 325 (65 FE) MG tablet Take 325 mg by mouth daily with breakfast. For Anemia and  Iron Deficiency    Flax Oil-Fish Oil-Borage Oil (FISH OIL-FLAX OIL-BORAGE OIL) CAPS Take 1 capsule by mouth daily.    hydrochlorothiazide (MICROZIDE) 12.5 MG capsule Take 12.5 mg by mouth daily.    ipratropium-albuterol (DUONEB) 0.5-2.5 (3) MG/3ML SOLN Take 3 mLs by nebulization every 6 (six) hours as needed (shortness of breath or wheezing).    isosorbide mononitrate (IMDUR) 30 MG 24 hr tablet Take 1 tablet (30 mg total) by mouth daily.    lisinopril (ZESTRIL) 40 MG tablet Take 1 tablet (40 mg total) by mouth daily.    loperamide (IMODIUM A-D) 2 MG tablet Take 2 mg by mouth as needed (after each stool. (do not exceed more  than 4 tabs in 24 hours)).    Melatonin 10 MG TABS Take 10 mg by mouth at bedtime.    meloxicam (MOBIC) 7.5 MG tablet Take 7.5 mg by mouth daily. give for left hip pain    memantine (NAMENDA) 10 MG tablet Take 10 mg by mouth 2 (two) times daily.    methocarbamol (ROBAXIN) 500 MG tablet Take 500 mg by mouth daily.    metoprolol tartrate (LOPRESSOR) 25 MG tablet Take  1 tablet (25 mg total) by mouth 2 (two) times daily.    omeprazole (PRILOSEC) 20 MG capsule Take 1 capsule (20 mg total) by mouth daily before breakfast.    ondansetron (ZOFRAN) 8 MG tablet Take 8 mg by mouth as needed for nausea.    OXYGEN Inhale 2 L into the lungs as needed (To keep O2 ststa > 90 %).    potassium chloride SA (KLOR-CON) 20 MEQ tablet Take 40 mEq by mouth 3 (three) times daily. 03/09/2021: Long term regimen. No duration specified.   QUEtiapine (SEROQUEL) 25 MG tablet Take 12.5 mg by mouth at bedtime.    rOPINIRole (REQUIP) 0.25 MG tablet Take 0.25 mg by mouth at bedtime.    senna-docusate (SENOKOT-S) 8.6-50 MG tablet Take 1 tablet by mouth at bedtime as needed for mild constipation.    simethicone (MYLICON) 017 MG chewable tablet Chew 125 mg by mouth 3 (three) times daily as needed ("increased gas").    spironolactone (ALDACTONE) 25 MG tablet Take 25 mg by mouth daily.    UNABLE TO FIND Med Name: Diet Regular    venlafaxine XR (EFFEXOR-XR) 150 MG 24 hr capsule Take 150 mg by mouth daily with breakfast.    No facility-administered encounter medications on file as of 04/13/2021.     SIGNIFICANT DIAGNOSTIC EXAMS   PREVIOUS   06-09-20: ct of abdomen and pelvis:  Cholelithiasis. Haziness noted around the gallbladder. Cannot exclude acute cholecystitis. Continued wall thickening in the left colon from the distal transverse colon through the distal descending colon concerning for colitis. Left colonic diverticulosis. Small bilateral pleural effusions, increasing since prior study. Bibasilar atelectasis. Coronary  artery disease, aortic atherosclerosis.  06-09-20: abdominal ultrasound:  1. Moderate gallbladder sludge with probable punctate stones. Slight increased wall thickness with small amount of fluid adjacent to the gallbladder but negative sonographic Murphy. Findings are suggestive of but not definitive for cholecystitis; consider correlation with nuclear medicine hepatobiliary imaging. 6 mm gallbladder polyp. 2. Simple appearing renal cysts. 3. Incidental note made of pleural effusions.  06-12-20: HIDA:  1. No scintigraphic evidence of acute cholecystitis. 2. Findings compatible with severe biliary dyskinesia with little to no gallbladder emptying and elicitation of abdominal pain with the CCK administration.  06-13-20: chest x-ray:  Diffuse interstitial opacity with Awanda Mink lines with possible trace pleural fluid. Stable heart size and aortic contours. Negative for air leak  NO NEW EXAMS.    LABS REVIEWED PREVIOUS    05-22-20: wbc 19.4; hgb 14.6; hct 43.8; mcv 81.6 plt 254; glucose 211; bun 35; creat 1.20; k+ 3.0; na++ 138; ca 9.8; liver normal albumin 3.5 mag 1.9; urine culture: <10,000; blood culture: no growth 05-25-20: wbc 14.2; hgb 20.8; hct 34.8; mcv 84.7 plt 212; glucose 109; bun 24; creat 0.86; k+ 3.4; na++ 137; ca 8.9; mag 1.9 05-28-20: wbc 10.9; hgb 11.8; hct 36.8; mcv 81.8 plt 227; glucose 132; bun 10; creat 0.82; k+ 3.0; na++ 137; ca 8.7 06-01-20: wbc 12.2; hgb 12.5; hct 40.3; mcv 81.9 plt 136; glucose 136; bun 12; creat 0.81; k+ 3.0; na++ 140; ca 8.9 mag 1.9 06-05-20: wbc 13.6; hgb 10.2; hct 32.9; mcv 93.5 plt 266 glucose 125; bun 35; creat 1.26; k+ 3.5; na++ 137; ca 8.9  06-07-20: wbc 11.9; hgb 9.6; hct 29.8; mcv 82.8 plt 318; glucose 114; bun 42; creat 1.47; k+ 3.9; na++ 138; ca 8.8 GFR 46; liver normal albumin 2.1  06-08-20: glucose 107; bun 57; creat 1.81; k+ 4.3; na++ 135; ca 8.9 GFR 36 06-09-20: glucose  107; bun 52; creat 1.64; k+ 5.3; na++ 136; ca 9.3;  GFR 40; liver normal  albumin 2.3 06-10-20: wbc 12.6; hgb 10.5; hct 34.2; mcv 83.4 plt 417; glucose 126; bun 39; creat 4.4; na++ 137; ca 9.5 GFR 58; liver normal albumin 2.2 06-12-20: wbc 8.2; hgb 10.4; hct 33.6; mcv 81.2 plt 364; glucose 123; bun 24; creat 0.87; k+ 3.5; na++ 138; GFR >60; liver normal albumin 1.9 06-13-20: urine culture: <10,000 colonies 06-14-20: wbc 9.0; hgb 10.5; hct 33.8; mcv 80.5 plt 267; glucose 122; bun 20; creat 0.97; k+3.3; na++ 140; ca 9.0 GFR >60 06-29-20: wbc 10.1; hgb 10.7; hct 36.1; mcv 83.0 plt 294; glucose 152; bun 26; creat 1.02; k+ 4.4; na++ 138 ca 8.8 GFR>60 07-24-20 PSA 16.0 08-14-20: glucose 110; bun 33; creat 1.00 ;k+ 2.5 na++ 140 08-15-20: glucose 114; bun 31; creat  0.84; k+ 3.1; na++ 142 08-21-20: k+ 3.5 10-04-20: albumin 3.2  10-19-20: PSA 12.25 (13.05)  12-21-20: wbc 4.6; hgb 13.4 hct 40.9 mcv 86.1 plt 223; glucose 104; bun 22; creat 0.89; k+ 3.0; na++ 143; ca 9.3 GFR>60; liver normal albumin 3.3; chol 128; ldl 71; trig 85 hdl 40   12-25-20: k+ 3.2 01-01-21: k+ 3.2 01-08-21: k+ 4.5 01-19-21: glucose 108; bun 20; creat 0.96; k+ 3.6; na++ 139; ca 9.0 GFR>60 02-05-21: glucose 109; bun 25; creat 1.05; k+ 4.5; na++ 141; ca 9.5; GFR>60.  03-13-21: wbc 7.3; hgb 14.0; hct 42.5; mcv 85.5 plt 212; glucose 126; bun 24; creat 1.03 ;k+ 3.9; na++ 139; ca 9.6; GFR>60 03-15-21: hgb a1c 5.7 03-20-21: wbc 20.3; hgb 14.6; hct 44.4; mcv 86.7 plt 229; glcusoe 201; bun 26; creat 1.44; k+ 4.2; na++ 138; ca 9.2; GFR 46  03-22-21: aldosterone 7.6; cortisol AM 19.5;  03-26-21: wbc 6.3; hgb 12.4; hct 37.9; mcv 87.5 plt 230; glucose 117; bun 28; creat 1.13; k+ 4.4; na++ 138; ca 9.2; GFR>60   NO NEW LABS.   Review of Systems  Constitutional:  Negative for malaise/fatigue.  Respiratory:  Negative for cough and shortness of breath.   Cardiovascular:  Negative for chest pain, palpitations and leg swelling.  Gastrointestinal:  Negative for abdominal pain, constipation and heartburn.  Musculoskeletal:  Negative for back  pain, joint pain and myalgias.  Skin: Negative.   Neurological:  Negative for dizziness.  Psychiatric/Behavioral:  The patient is not nervous/anxious.    Physical Exam Constitutional:      General: He is not in acute distress.    Appearance: He is well-developed. He is not diaphoretic.  Neck:     Thyroid: No thyromegaly.  Cardiovascular:     Rate and Rhythm: Normal rate and regular rhythm.     Pulses: Normal pulses.     Heart sounds: Normal heart sounds.  Pulmonary:     Effort: Pulmonary effort is normal. No respiratory distress.     Breath sounds: Normal breath sounds.  Abdominal:     General: Bowel sounds are normal. There is no distension.     Palpations: Abdomen is soft.     Tenderness: There is no abdominal tenderness.  Musculoskeletal:        General: Normal range of motion.     Cervical back: Neck supple.     Right lower leg: No edema.     Left lower leg: No edema.  Lymphadenopathy:     Cervical: No cervical adenopathy.  Skin:    General: Skin is warm and dry.  Neurological:     Mental Status: He is alert and  oriented to person, place, and time.  Psychiatric:        Mood and Affect: Mood normal.      ASSESSMENT/ PLAN:  TODAY  Aortic atherosclerosis Dementia without behavioral disturbance Major depression recurrent chronic Interstitial lung disease  Will continue current medications Will continue therapy as directed Will continue current plan of care Will continue to monitor his status.    Time spent with patient 40 minutes: therapy needs; medications; plan of care.    Ok Edwards NP [facility]  Contact [phone] Monday through Friday 8am- 5pm  After hours call [phone]

## 2021-04-15 DIAGNOSIS — Z472 Encounter for removal of internal fixation device: Secondary | ICD-10-CM | POA: Diagnosis not present

## 2021-04-15 DIAGNOSIS — M6281 Muscle weakness (generalized): Secondary | ICD-10-CM | POA: Diagnosis not present

## 2021-04-15 DIAGNOSIS — Z48815 Encounter for surgical aftercare following surgery on the digestive system: Secondary | ICD-10-CM | POA: Diagnosis not present

## 2021-04-15 DIAGNOSIS — R2681 Unsteadiness on feet: Secondary | ICD-10-CM | POA: Diagnosis not present

## 2021-04-15 DIAGNOSIS — R262 Difficulty in walking, not elsewhere classified: Secondary | ICD-10-CM | POA: Diagnosis not present

## 2021-04-15 DIAGNOSIS — Z9181 History of falling: Secondary | ICD-10-CM | POA: Diagnosis not present

## 2021-04-16 DIAGNOSIS — Z48815 Encounter for surgical aftercare following surgery on the digestive system: Secondary | ICD-10-CM | POA: Diagnosis not present

## 2021-04-16 DIAGNOSIS — M6281 Muscle weakness (generalized): Secondary | ICD-10-CM | POA: Diagnosis not present

## 2021-04-16 DIAGNOSIS — Z9181 History of falling: Secondary | ICD-10-CM | POA: Diagnosis not present

## 2021-04-16 DIAGNOSIS — R2681 Unsteadiness on feet: Secondary | ICD-10-CM | POA: Diagnosis not present

## 2021-04-16 DIAGNOSIS — R262 Difficulty in walking, not elsewhere classified: Secondary | ICD-10-CM | POA: Diagnosis not present

## 2021-04-16 DIAGNOSIS — Z472 Encounter for removal of internal fixation device: Secondary | ICD-10-CM | POA: Diagnosis not present

## 2021-04-17 DIAGNOSIS — Z9181 History of falling: Secondary | ICD-10-CM | POA: Diagnosis not present

## 2021-04-17 DIAGNOSIS — Z472 Encounter for removal of internal fixation device: Secondary | ICD-10-CM | POA: Diagnosis not present

## 2021-04-17 DIAGNOSIS — M6281 Muscle weakness (generalized): Secondary | ICD-10-CM | POA: Diagnosis not present

## 2021-04-17 DIAGNOSIS — Z48815 Encounter for surgical aftercare following surgery on the digestive system: Secondary | ICD-10-CM | POA: Diagnosis not present

## 2021-04-17 DIAGNOSIS — R2681 Unsteadiness on feet: Secondary | ICD-10-CM | POA: Diagnosis not present

## 2021-04-17 DIAGNOSIS — R262 Difficulty in walking, not elsewhere classified: Secondary | ICD-10-CM | POA: Diagnosis not present

## 2021-04-18 DIAGNOSIS — Z48815 Encounter for surgical aftercare following surgery on the digestive system: Secondary | ICD-10-CM | POA: Diagnosis not present

## 2021-04-18 DIAGNOSIS — Z9181 History of falling: Secondary | ICD-10-CM | POA: Diagnosis not present

## 2021-04-18 DIAGNOSIS — F331 Major depressive disorder, recurrent, moderate: Secondary | ICD-10-CM | POA: Diagnosis not present

## 2021-04-18 DIAGNOSIS — Z472 Encounter for removal of internal fixation device: Secondary | ICD-10-CM | POA: Diagnosis not present

## 2021-04-18 DIAGNOSIS — R2681 Unsteadiness on feet: Secondary | ICD-10-CM | POA: Diagnosis not present

## 2021-04-18 DIAGNOSIS — M6281 Muscle weakness (generalized): Secondary | ICD-10-CM | POA: Diagnosis not present

## 2021-04-18 DIAGNOSIS — R262 Difficulty in walking, not elsewhere classified: Secondary | ICD-10-CM | POA: Diagnosis not present

## 2021-04-19 DIAGNOSIS — M6281 Muscle weakness (generalized): Secondary | ICD-10-CM | POA: Diagnosis not present

## 2021-04-19 DIAGNOSIS — Z9181 History of falling: Secondary | ICD-10-CM | POA: Diagnosis not present

## 2021-04-19 DIAGNOSIS — Z48815 Encounter for surgical aftercare following surgery on the digestive system: Secondary | ICD-10-CM | POA: Diagnosis not present

## 2021-04-19 DIAGNOSIS — Z472 Encounter for removal of internal fixation device: Secondary | ICD-10-CM | POA: Diagnosis not present

## 2021-04-19 DIAGNOSIS — R262 Difficulty in walking, not elsewhere classified: Secondary | ICD-10-CM | POA: Diagnosis not present

## 2021-04-19 DIAGNOSIS — R2681 Unsteadiness on feet: Secondary | ICD-10-CM | POA: Diagnosis not present

## 2021-04-20 DIAGNOSIS — Z472 Encounter for removal of internal fixation device: Secondary | ICD-10-CM | POA: Diagnosis not present

## 2021-04-20 DIAGNOSIS — Z48815 Encounter for surgical aftercare following surgery on the digestive system: Secondary | ICD-10-CM | POA: Diagnosis not present

## 2021-04-20 DIAGNOSIS — Z9181 History of falling: Secondary | ICD-10-CM | POA: Diagnosis not present

## 2021-04-20 DIAGNOSIS — R262 Difficulty in walking, not elsewhere classified: Secondary | ICD-10-CM | POA: Diagnosis not present

## 2021-04-20 DIAGNOSIS — R2681 Unsteadiness on feet: Secondary | ICD-10-CM | POA: Diagnosis not present

## 2021-04-20 DIAGNOSIS — M6281 Muscle weakness (generalized): Secondary | ICD-10-CM | POA: Diagnosis not present

## 2021-04-23 ENCOUNTER — Other Ambulatory Visit: Payer: Self-pay

## 2021-04-23 ENCOUNTER — Other Ambulatory Visit: Payer: Medicaid Other

## 2021-04-23 DIAGNOSIS — C61 Malignant neoplasm of prostate: Secondary | ICD-10-CM

## 2021-04-23 DIAGNOSIS — Z472 Encounter for removal of internal fixation device: Secondary | ICD-10-CM | POA: Diagnosis not present

## 2021-04-23 DIAGNOSIS — Z48815 Encounter for surgical aftercare following surgery on the digestive system: Secondary | ICD-10-CM | POA: Diagnosis not present

## 2021-04-23 DIAGNOSIS — Z9181 History of falling: Secondary | ICD-10-CM | POA: Diagnosis not present

## 2021-04-23 DIAGNOSIS — R262 Difficulty in walking, not elsewhere classified: Secondary | ICD-10-CM | POA: Diagnosis not present

## 2021-04-23 DIAGNOSIS — M6281 Muscle weakness (generalized): Secondary | ICD-10-CM | POA: Diagnosis not present

## 2021-04-23 DIAGNOSIS — R2681 Unsteadiness on feet: Secondary | ICD-10-CM | POA: Diagnosis not present

## 2021-04-24 DIAGNOSIS — Z48815 Encounter for surgical aftercare following surgery on the digestive system: Secondary | ICD-10-CM | POA: Diagnosis not present

## 2021-04-24 DIAGNOSIS — Z472 Encounter for removal of internal fixation device: Secondary | ICD-10-CM | POA: Diagnosis not present

## 2021-04-24 DIAGNOSIS — Z9181 History of falling: Secondary | ICD-10-CM | POA: Diagnosis not present

## 2021-04-24 DIAGNOSIS — M6281 Muscle weakness (generalized): Secondary | ICD-10-CM | POA: Diagnosis not present

## 2021-04-24 DIAGNOSIS — R2681 Unsteadiness on feet: Secondary | ICD-10-CM | POA: Diagnosis not present

## 2021-04-24 DIAGNOSIS — R262 Difficulty in walking, not elsewhere classified: Secondary | ICD-10-CM | POA: Diagnosis not present

## 2021-04-24 LAB — TESTOSTERONE: Testosterone: <3 ng/dL — ABNORMAL LOW (ref 264–916)

## 2021-04-24 LAB — PSA: Prostate Specific Ag, Serum: 0.6 ng/mL (ref 0.0–4.0)

## 2021-04-25 ENCOUNTER — Ambulatory Visit (HOSPITAL_COMMUNITY)
Admission: RE | Admit: 2021-04-25 | Discharge: 2021-04-25 | Disposition: A | Discharge status: Home / Self Care | Payer: Medicare Other | Source: Ambulatory Visit | Attending: Urology | Admitting: Urology

## 2021-04-25 DIAGNOSIS — N401 Enlarged prostate with lower urinary tract symptoms: Secondary | ICD-10-CM | POA: Insufficient documentation

## 2021-04-25 DIAGNOSIS — M6281 Muscle weakness (generalized): Secondary | ICD-10-CM | POA: Diagnosis not present

## 2021-04-25 DIAGNOSIS — Z9181 History of falling: Secondary | ICD-10-CM | POA: Diagnosis not present

## 2021-04-25 DIAGNOSIS — N138 Other obstructive and reflux uropathy: Secondary | ICD-10-CM | POA: Insufficient documentation

## 2021-04-25 DIAGNOSIS — R262 Difficulty in walking, not elsewhere classified: Secondary | ICD-10-CM | POA: Diagnosis not present

## 2021-04-25 DIAGNOSIS — R2681 Unsteadiness on feet: Secondary | ICD-10-CM | POA: Diagnosis not present

## 2021-04-25 DIAGNOSIS — Z472 Encounter for removal of internal fixation device: Secondary | ICD-10-CM | POA: Diagnosis not present

## 2021-04-25 DIAGNOSIS — Z48815 Encounter for surgical aftercare following surgery on the digestive system: Secondary | ICD-10-CM | POA: Diagnosis not present

## 2021-04-26 DIAGNOSIS — Z48815 Encounter for surgical aftercare following surgery on the digestive system: Secondary | ICD-10-CM | POA: Diagnosis not present

## 2021-04-26 DIAGNOSIS — R262 Difficulty in walking, not elsewhere classified: Secondary | ICD-10-CM | POA: Diagnosis not present

## 2021-04-26 DIAGNOSIS — M6281 Muscle weakness (generalized): Secondary | ICD-10-CM | POA: Diagnosis not present

## 2021-04-26 DIAGNOSIS — Z472 Encounter for removal of internal fixation device: Secondary | ICD-10-CM | POA: Diagnosis not present

## 2021-04-26 DIAGNOSIS — Z9181 History of falling: Secondary | ICD-10-CM | POA: Diagnosis not present

## 2021-04-26 DIAGNOSIS — R2681 Unsteadiness on feet: Secondary | ICD-10-CM | POA: Diagnosis not present

## 2021-04-27 DIAGNOSIS — R2681 Unsteadiness on feet: Secondary | ICD-10-CM | POA: Diagnosis not present

## 2021-04-27 DIAGNOSIS — M6281 Muscle weakness (generalized): Secondary | ICD-10-CM | POA: Diagnosis not present

## 2021-04-27 DIAGNOSIS — Z9181 History of falling: Secondary | ICD-10-CM | POA: Diagnosis not present

## 2021-04-27 DIAGNOSIS — R262 Difficulty in walking, not elsewhere classified: Secondary | ICD-10-CM | POA: Diagnosis not present

## 2021-04-27 DIAGNOSIS — Z472 Encounter for removal of internal fixation device: Secondary | ICD-10-CM | POA: Diagnosis not present

## 2021-04-27 DIAGNOSIS — Z48815 Encounter for surgical aftercare following surgery on the digestive system: Secondary | ICD-10-CM | POA: Diagnosis not present

## 2021-04-30 ENCOUNTER — Encounter: Payer: Self-pay | Admitting: Urology

## 2021-04-30 ENCOUNTER — Ambulatory Visit (INDEPENDENT_AMBULATORY_CARE_PROVIDER_SITE_OTHER): Payer: Medicare Other | Admitting: Urology

## 2021-04-30 ENCOUNTER — Other Ambulatory Visit: Payer: Self-pay

## 2021-04-30 VITALS — BP 185/92 | HR 58

## 2021-04-30 DIAGNOSIS — R262 Difficulty in walking, not elsewhere classified: Secondary | ICD-10-CM | POA: Diagnosis not present

## 2021-04-30 DIAGNOSIS — N138 Other obstructive and reflux uropathy: Secondary | ICD-10-CM | POA: Diagnosis not present

## 2021-04-30 DIAGNOSIS — Z472 Encounter for removal of internal fixation device: Secondary | ICD-10-CM | POA: Diagnosis not present

## 2021-04-30 DIAGNOSIS — R3915 Urgency of urination: Secondary | ICD-10-CM

## 2021-04-30 DIAGNOSIS — Z9181 History of falling: Secondary | ICD-10-CM | POA: Diagnosis not present

## 2021-04-30 DIAGNOSIS — C61 Malignant neoplasm of prostate: Secondary | ICD-10-CM | POA: Diagnosis not present

## 2021-04-30 DIAGNOSIS — R2681 Unsteadiness on feet: Secondary | ICD-10-CM | POA: Diagnosis not present

## 2021-04-30 DIAGNOSIS — Z48815 Encounter for surgical aftercare following surgery on the digestive system: Secondary | ICD-10-CM | POA: Diagnosis not present

## 2021-04-30 DIAGNOSIS — N401 Enlarged prostate with lower urinary tract symptoms: Secondary | ICD-10-CM | POA: Diagnosis not present

## 2021-04-30 DIAGNOSIS — M6281 Muscle weakness (generalized): Secondary | ICD-10-CM | POA: Diagnosis not present

## 2021-04-30 LAB — URINALYSIS, ROUTINE W REFLEX MICROSCOPIC
Bilirubin, UA: NEGATIVE
Glucose, UA: NEGATIVE
Ketones, UA: NEGATIVE
Leukocytes,UA: NEGATIVE
Nitrite, UA: NEGATIVE
Specific Gravity, UA: 1.02 (ref 1.005–1.030)
Urobilinogen, Ur: 0.2 mg/dL (ref 0.2–1.0)
pH, UA: 5.5 (ref 5.0–7.5)

## 2021-04-30 LAB — MICROSCOPIC EXAMINATION
RBC, Urine: NONE SEEN /hpf (ref 0–2)
Renal Epithel, UA: NONE SEEN /hpf

## 2021-04-30 MED ORDER — GEMTESA 75 MG PO TABS: 1.0000 | ORAL_TABLET | Freq: Every day | ORAL | 11 refills | Status: DC

## 2021-04-30 MED ORDER — CIPROFLOXACIN HCL 500 MG PO TABS
500.0000 mg | ORAL_TABLET | Freq: Once | ORAL | Status: AC
  Administered 2021-04-30: 500 mg via ORAL

## 2021-04-30 NOTE — Progress Notes (Signed)
Urological Symptom Review  Patient is experiencing the following symptoms: N/a   Review of Systems  Gastrointestinal (upper)  :  Negative for upper GI symptoms Gastrointestinal (lower) : Negative for lower GI symptoms  Constitutional : Negative for symptoms  Skin: Negative for skin symptoms  Eyes: Negative for eye symptoms  Ear/Nose/Throat : Negative for Ear/Nose/Throat symptoms  Hematologic/Lymphatic:   Cardiovascular : Negative for cardiovascular symptoms  Respiratory : Negative for respiratory symptoms  Endocrine: Negative for endocrine symptoms  Musculoskeletal: Negative for musculoskeletal symptoms  Neurological: Negative for neurological symptoms  Psychologic: Negative for psychiatric symptoms

## 2021-04-30 NOTE — Progress Notes (Signed)
04/30/2021 9:08 AM   Jeffrey Sherman 05/08/1930 [id number]  Referring provider: Gerlene Fee, NP [address],  [place] [address]  No chief complaint on file. BPH, PCa treatment   HPI:  F/u -patient returns today with 2 issues.  One for cystoscopy (management of BPH and lower urinary tract symptoms) and two for management of prostate cancer.   1) BPH with LUTS - urinary frequency despite taking both alfuzosin and terazosin. Off alfuzosin due to dizziness. He does c/o about his urinary frequency.  Pt denies any recent UTIs, significant urinary incontinence, gross hematuria, or blood in his stool. AUASS is 15. Prostate was about 40 grams on CT in 2021. PVR = 100 ml.    Tried Myrbetriq 02/22. Not a lot of improvement. Prostate 27 grams on [place] Nov 2022.  Cystoscopy today with obstruction from lateral lobe hypertrophy.  Moderate bladder trabeculation.  He continues to complain of urinary frequency especially at night.  He can go 3 to 4 hours in the day and only 1 to 2 hours at night.    2) PCa - dx in 2002 with Dr. Serita Butcher with PSA 22.7, Gleason 3+4=7, 3+3=6. No definitive therapy. On ADT until 2013 when he started IADT. He stopped ADT after a 2015 dose and PSA rose to 28.7 in March 2021 and 24 in May 2021. He received a 4 month Lupron in May 2021 and PSA did not respond - it remained stable at 24.4 in Aug 2021 with T  32. He was adamant about not restarting ADT and hasnt had any further. His PSA has declined. His PSA is lower at 16 in Feb 2022.   Primary treatment: None  Current Tx: IADT    Staging: May 2019: Bone/CT scan both showed no evidence of metastatic disease.  Dec 2021 CT no evidence of mets - PSA 24   In the past ADT has helped his LUTS. He was given Eligard 45 mg May 2022 at PSA 12.25. His Nov 2022 PSA down to 0.6 with T < 3.       Today, seen for the above.      PMH: Past Medical History:  Diagnosis Date   Anxiety    Cancer [facility])    prostate    Chronic chest wall pain    Essential hypertension, benign 06/20/2016   GAD (generalized anxiety disorder)    GERD (gastroesophageal reflux disease)    H/O echocardiogram 2016   normal   Heart murmur    "leaking heart valve"   High cholesterol 06/20/2016   HOH (hard of hearing)    left   Hx of falling    Interstitial pulmonary disease (HCC)    Lower GI bleed    MDD (major depressive disorder)    Murmur, cardiac    Poor historian    PUD (peptic ulcer disease)    RLS (restless legs syndrome)    Shortness of breath    Wrist fracture 12/16/2011    Surgical History: Past Surgical History:  Procedure Laterality Date   CHOLECYSTECTOMY N/A 06/28/2020   Procedure: LAPAROSCOPIC CHOLECYSTECTOMY;  Surgeon: [name], MD;  Location: [facility];  Service: General;  Laterality: N/A;  pt in [place]  2005   Rehman: Sigmoid colon diverticulosis, moderate sized external hemorrhoids. A few focal areas of mucosal erythema felt to be nonspecific.   ESOPHAGOGASTRODUODENOSCOPY     ESOPHAGOGASTRODUODENOSCOPY N/A 07/18/2016   Rehman: normal exam except duodenal scar. h.pylori serologies negative  FLEXIBLE SIGMOIDOSCOPY N/A 06/11/2020   Procedure: FLEXIBLE SIGMOIDOSCOPY;  Surgeon: Eloise Harman, DO;  [facility];  Service: Endoscopy;  Laterality: N/A;   HARDWARE REMOVAL Right 03/20/2021   Procedure: HARDWARE REMOVAL right hip;  Surgeon: Carole Civil, MD;  Location: [facility];  Service: Orthopedics;  Laterality: Right;   HIP PINNING,CANNULATED Right 03/10/2020   Procedure: INTERNAL FIXATION RIGHT HIP;  Surgeon: Carole Civil, MD;  Location: [facility];  Service: Orthopedics;  Laterality: Right;   ORIF WRIST FRACTURE  12/19/2011   Procedure: OPEN REDUCTION INTERNAL FIXATION (ORIF) WRIST FRACTURE;  Surgeon: Carole Civil, MD;  Location: [facility];  Service: Orthopedics;  Laterality: Right;   prostate cancer     Diagnosed in the 90s   TONSILLECTOMY      Home  Medications:  Allergies as of 04/30/2021       Reactions   Hctz [hydrochlorothiazide]    hypokalemia        Medication List      Notice   This visit is during an admission. Changes to the med list made in this visit will be reflected in the After Visit Summary of the admission.     Allergies:  Allergies  Allergen Reactions   Hctz [Hydrochlorothiazide]     hypokalemia    Family History: Family History  Problem Relation Age of Onset   Cancer Other    Heart attack Mother    Cancer Brother    Heart attack Brother    Heart disease Sister        by pass    Social History:  reports that he has never smoked. He has never used smokeless tobacco. He reports that he does not drink alcohol and does not use drugs.   Blood pressure (!) 185/92, pulse (!) 58. PE: NED. A&Ox3.   No respiratory distress   Abd soft, NT, ND Normal phallus with bilateral descended testicles, uncircumcised, normal foreskin.  Glans and meatus normal.  Cystoscopy Procedure Note  Patient identification was confirmed, informed consent was obtained, and patient was prepped using Betadine solution.  Lidocaine jelly was administered per urethral meatus.     Pre-Procedure: - Inspection reveals a normal caliber ureteral meatus.  Procedure: The flexible cystoscope was introduced without difficulty - No urethral strictures/lesions are present. - enlarged prostate with visual obstruction from lateral lobe hypertrophy - normal bladder neck - Bilateral ureteral orifices identified - Bladder mucosa  reveals no ulcers, tumors, or lesions - No bladder stones - moderate trabeculation  Retroflexion shows normal bladder and bladder neck    Post-Procedure: - Patient tolerated the procedure well -He was given a Cipro.  Laboratory Data: Lab Results  Component Value Date   WBC 6.3 03/26/2021   HGB 12.4 (L) 03/26/2021   HCT 37.9 (L) 03/26/2021   MCV 87.5 03/26/2021   PLT 230 03/26/2021    Lab Results   Component Value Date   CREATININE 1.13 03/26/2021    Lab Results  Component Value Date   PSA 24.0 (H) 10/19/2019   PSA 28.7 (H) 08/25/2019    Lab Results  Component Value Date   TESTOSTERONE <3 (L) 04/23/2021    Lab Results  Component Value Date   HGBA1C 5.7 (H) 03/15/2021    Urinalysis    Component Value Date/Time   COLORURINE YELLOW 06/13/2020 2215   APPEARANCEUR Clear 10/23/2020 1012   LABSPEC 1.014 06/13/2020 2215   PHURINE 5.0 06/13/2020 2215   GLUCOSEU Negative 10/23/2020 1012   HGBUR NEGATIVE  06/13/2020 2215   BILIRUBINUR Negative 10/23/2020 [place] 06/13/2020 2215   PROTEINUR 2+ (A) 10/23/2020 1012   PROTEINUR 30 (A) 06/13/2020 2215   UROBILINOGEN negative (A) 10/19/2019 0958   UROBILINOGEN 0.2 05/08/2014 2045   NITRITE Negative 10/23/2020 1012   NITRITE NEGATIVE 06/13/2020 2215   LEUKOCYTESUR Negative 10/23/2020 1012   LEUKOCYTESUR NEGATIVE 06/13/2020 2215    Lab Results  Component Value Date   LABMICR See below: 10/23/2020   WBCUA None seen 10/23/2020   LABEPIT None seen 10/23/2020   MUCUS Present 10/23/2020   BACTERIA None seen 10/23/2020    Pertinent Imaging: Prostate [place] images    Assessment & Plan:    1. Urgency of micturition We discussed the nature risk and benefits of trial of Gemtesa.  I do not think he is a good anticholinergic candidate given the potassium use and his age.  We also discussed the nature risk and benefits of PTNS or UroLift.  We discussed how outlet procedures might reflect flow versus irritative symptoms (last success) and risk of bleeding, stricture and incontinence among others.  We discussed success rate with nocturia would be variable. They will consider.  - Urinalysis, Routine w reflex microscopic - ciprofloxacin (CIPRO) tablet 500 mg  2. BPH with obstruction/lower urinary tract symptoms Continue alfuzosin - Urinalysis, Routine w reflex microscopic - ciprofloxacin (CIPRO) tablet 500 mg  3.  Prostate cancer - good PSA response p ADT. Follow T and PSA.   No follow-ups on file.  Festus Aloe, MD  [facility]  [address] [place], [place] [address] [phone]

## 2021-05-01 DIAGNOSIS — M6281 Muscle weakness (generalized): Secondary | ICD-10-CM | POA: Diagnosis not present

## 2021-05-01 DIAGNOSIS — Z48815 Encounter for surgical aftercare following surgery on the digestive system: Secondary | ICD-10-CM | POA: Diagnosis not present

## 2021-05-01 DIAGNOSIS — Z9181 History of falling: Secondary | ICD-10-CM | POA: Diagnosis not present

## 2021-05-01 DIAGNOSIS — Z472 Encounter for removal of internal fixation device: Secondary | ICD-10-CM | POA: Diagnosis not present

## 2021-05-01 DIAGNOSIS — R262 Difficulty in walking, not elsewhere classified: Secondary | ICD-10-CM | POA: Diagnosis not present

## 2021-05-01 DIAGNOSIS — F331 Major depressive disorder, recurrent, moderate: Secondary | ICD-10-CM | POA: Diagnosis not present

## 2021-05-01 DIAGNOSIS — R2681 Unsteadiness on feet: Secondary | ICD-10-CM | POA: Diagnosis not present

## 2021-05-02 DIAGNOSIS — R262 Difficulty in walking, not elsewhere classified: Secondary | ICD-10-CM | POA: Diagnosis not present

## 2021-05-02 DIAGNOSIS — Z48815 Encounter for surgical aftercare following surgery on the digestive system: Secondary | ICD-10-CM | POA: Diagnosis not present

## 2021-05-02 DIAGNOSIS — Z472 Encounter for removal of internal fixation device: Secondary | ICD-10-CM | POA: Diagnosis not present

## 2021-05-02 DIAGNOSIS — Z9181 History of falling: Secondary | ICD-10-CM | POA: Diagnosis not present

## 2021-05-02 DIAGNOSIS — R2681 Unsteadiness on feet: Secondary | ICD-10-CM | POA: Diagnosis not present

## 2021-05-02 DIAGNOSIS — M6281 Muscle weakness (generalized): Secondary | ICD-10-CM | POA: Diagnosis not present

## 2021-05-03 DIAGNOSIS — M6281 Muscle weakness (generalized): Secondary | ICD-10-CM | POA: Diagnosis not present

## 2021-05-03 DIAGNOSIS — Z9181 History of falling: Secondary | ICD-10-CM | POA: Diagnosis not present

## 2021-05-03 DIAGNOSIS — Z48815 Encounter for surgical aftercare following surgery on the digestive system: Secondary | ICD-10-CM | POA: Diagnosis not present

## 2021-05-03 DIAGNOSIS — R2681 Unsteadiness on feet: Secondary | ICD-10-CM | POA: Diagnosis not present

## 2021-05-03 DIAGNOSIS — Z472 Encounter for removal of internal fixation device: Secondary | ICD-10-CM | POA: Diagnosis not present

## 2021-05-03 DIAGNOSIS — R262 Difficulty in walking, not elsewhere classified: Secondary | ICD-10-CM | POA: Diagnosis not present

## 2021-05-07 DIAGNOSIS — R262 Difficulty in walking, not elsewhere classified: Secondary | ICD-10-CM | POA: Diagnosis not present

## 2021-05-07 DIAGNOSIS — Z48815 Encounter for surgical aftercare following surgery on the digestive system: Secondary | ICD-10-CM | POA: Diagnosis not present

## 2021-05-07 DIAGNOSIS — Z9181 History of falling: Secondary | ICD-10-CM | POA: Diagnosis not present

## 2021-05-07 DIAGNOSIS — Z472 Encounter for removal of internal fixation device: Secondary | ICD-10-CM | POA: Diagnosis not present

## 2021-05-07 DIAGNOSIS — M6281 Muscle weakness (generalized): Secondary | ICD-10-CM | POA: Diagnosis not present

## 2021-05-07 DIAGNOSIS — R2681 Unsteadiness on feet: Secondary | ICD-10-CM | POA: Diagnosis not present

## 2021-05-08 DIAGNOSIS — M6281 Muscle weakness (generalized): Secondary | ICD-10-CM | POA: Diagnosis not present

## 2021-05-08 DIAGNOSIS — R262 Difficulty in walking, not elsewhere classified: Secondary | ICD-10-CM | POA: Diagnosis not present

## 2021-05-08 DIAGNOSIS — R2681 Unsteadiness on feet: Secondary | ICD-10-CM | POA: Diagnosis not present

## 2021-05-08 DIAGNOSIS — F331 Major depressive disorder, recurrent, moderate: Secondary | ICD-10-CM | POA: Diagnosis not present

## 2021-05-08 DIAGNOSIS — Z48815 Encounter for surgical aftercare following surgery on the digestive system: Secondary | ICD-10-CM | POA: Diagnosis not present

## 2021-05-08 DIAGNOSIS — Z472 Encounter for removal of internal fixation device: Secondary | ICD-10-CM | POA: Diagnosis not present

## 2021-05-08 DIAGNOSIS — Z9181 History of falling: Secondary | ICD-10-CM | POA: Diagnosis not present

## 2021-05-08 DIAGNOSIS — F419 Anxiety disorder, unspecified: Secondary | ICD-10-CM | POA: Diagnosis not present

## 2021-05-09 DIAGNOSIS — Z9181 History of falling: Secondary | ICD-10-CM | POA: Diagnosis not present

## 2021-05-09 DIAGNOSIS — M6281 Muscle weakness (generalized): Secondary | ICD-10-CM | POA: Diagnosis not present

## 2021-05-09 DIAGNOSIS — Z48815 Encounter for surgical aftercare following surgery on the digestive system: Secondary | ICD-10-CM | POA: Diagnosis not present

## 2021-05-09 DIAGNOSIS — Z472 Encounter for removal of internal fixation device: Secondary | ICD-10-CM | POA: Diagnosis not present

## 2021-05-09 DIAGNOSIS — R262 Difficulty in walking, not elsewhere classified: Secondary | ICD-10-CM | POA: Diagnosis not present

## 2021-05-09 DIAGNOSIS — R2681 Unsteadiness on feet: Secondary | ICD-10-CM | POA: Diagnosis not present

## 2021-05-11 DIAGNOSIS — Z48815 Encounter for surgical aftercare following surgery on the digestive system: Secondary | ICD-10-CM | POA: Diagnosis not present

## 2021-05-11 DIAGNOSIS — R41841 Cognitive communication deficit: Secondary | ICD-10-CM | POA: Diagnosis not present

## 2021-05-11 DIAGNOSIS — F331 Major depressive disorder, recurrent, moderate: Secondary | ICD-10-CM | POA: Diagnosis not present

## 2021-05-11 DIAGNOSIS — R2681 Unsteadiness on feet: Secondary | ICD-10-CM | POA: Diagnosis not present

## 2021-05-11 DIAGNOSIS — M6281 Muscle weakness (generalized): Secondary | ICD-10-CM | POA: Diagnosis not present

## 2021-05-11 DIAGNOSIS — Z472 Encounter for removal of internal fixation device: Secondary | ICD-10-CM | POA: Diagnosis not present

## 2021-05-11 DIAGNOSIS — Z9181 History of falling: Secondary | ICD-10-CM | POA: Diagnosis not present

## 2021-05-11 DIAGNOSIS — Z741 Need for assistance with personal care: Secondary | ICD-10-CM | POA: Diagnosis not present

## 2021-05-11 DIAGNOSIS — R262 Difficulty in walking, not elsewhere classified: Secondary | ICD-10-CM | POA: Diagnosis not present

## 2021-05-14 ENCOUNTER — Encounter: Payer: Self-pay | Admitting: Adult Health

## 2021-05-14 ENCOUNTER — Non-Acute Institutional Stay (SKILLED_NURSING_FACILITY): Payer: Medicare Other | Admitting: Adult Health

## 2021-05-14 DIAGNOSIS — Z9181 History of falling: Secondary | ICD-10-CM | POA: Diagnosis not present

## 2021-05-14 DIAGNOSIS — Z472 Encounter for removal of internal fixation device: Secondary | ICD-10-CM | POA: Diagnosis not present

## 2021-05-14 DIAGNOSIS — N3281 Overactive bladder: Secondary | ICD-10-CM | POA: Diagnosis not present

## 2021-05-14 DIAGNOSIS — C61 Malignant neoplasm of prostate: Secondary | ICD-10-CM | POA: Diagnosis not present

## 2021-05-14 DIAGNOSIS — K5909 Other constipation: Secondary | ICD-10-CM | POA: Diagnosis not present

## 2021-05-14 DIAGNOSIS — E785 Hyperlipidemia, unspecified: Secondary | ICD-10-CM | POA: Diagnosis not present

## 2021-05-14 DIAGNOSIS — I739 Peripheral vascular disease, unspecified: Secondary | ICD-10-CM | POA: Diagnosis not present

## 2021-05-14 DIAGNOSIS — L602 Onychogryphosis: Secondary | ICD-10-CM | POA: Diagnosis not present

## 2021-05-14 DIAGNOSIS — E876 Hypokalemia: Secondary | ICD-10-CM | POA: Diagnosis not present

## 2021-05-14 DIAGNOSIS — Z48815 Encounter for surgical aftercare following surgery on the digestive system: Secondary | ICD-10-CM | POA: Diagnosis not present

## 2021-05-14 DIAGNOSIS — R2681 Unsteadiness on feet: Secondary | ICD-10-CM | POA: Diagnosis not present

## 2021-05-14 DIAGNOSIS — M6281 Muscle weakness (generalized): Secondary | ICD-10-CM | POA: Diagnosis not present

## 2021-05-14 DIAGNOSIS — R262 Difficulty in walking, not elsewhere classified: Secondary | ICD-10-CM | POA: Diagnosis not present

## 2021-05-14 NOTE — Progress Notes (Signed)
Location:  [facility] Room Number: 156-D Place of Service:  SNF (31)   CODE STATUS: DNR  Allergies  Allergen Reactions   Hctz [Hydrochlorothiazide]     hypokalemia    Chief Complaint  Patient presents with   Medical Management of Chronic Issues               Prostate cancer/overactive bladder:  Hypokalemia:  Dyslipidemia  Chronic constipation    HPI:  He is  87 year old long term resident of this facility being seen for the management of his chronic illnesses: Prostate cancer/overactive bladder:  Hypokalemia:  Dyslipidemia  Chronic constipation. There are no reports of uncontrolled pain. No changes in appetite; weight is stable.   Past Medical History:  Diagnosis Date   Anxiety    Cancer [facility])    prostate   Chronic chest wall pain    Essential hypertension, benign 06/20/2016   GAD (generalized anxiety disorder)    GERD (gastroesophageal reflux disease)    H/O echocardiogram 2016   normal   Heart murmur    "leaking heart valve"   High cholesterol 06/20/2016   HOH (hard of hearing)    left   Hx of falling    Interstitial pulmonary disease (HCC)    Lower GI bleed    MDD (major depressive disorder)    Murmur, cardiac    Poor historian    PUD (peptic ulcer disease)    RLS (restless legs syndrome)    Shortness of breath    Wrist fracture 12/16/2011    Past Surgical History:  Procedure Laterality Date   CHOLECYSTECTOMY N/A 06/28/2020   Procedure: LAPAROSCOPIC CHOLECYSTECTOMY;  Surgeon: [name], MD;  Location: [facility];  Service: General;  Laterality: N/A;  pt in [place]  2005   Rehman: Sigmoid colon diverticulosis, moderate sized external hemorrhoids. A few focal areas of mucosal erythema felt to be nonspecific.   ESOPHAGOGASTRODUODENOSCOPY     ESOPHAGOGASTRODUODENOSCOPY N/A 07/18/2016   Rehman: normal exam except duodenal scar. h.pylori serologies negative   FLEXIBLE SIGMOIDOSCOPY N/A 06/11/2020   Procedure: FLEXIBLE  SIGMOIDOSCOPY;  Surgeon: Eloise Harman, DO;  Location: [facility];  Service: Endoscopy;  Laterality: N/A;   HARDWARE REMOVAL Right 03/20/2021   Procedure: HARDWARE REMOVAL right hip;  Surgeon: Carole Civil, MD;  Location: [facility];  Service: Orthopedics;  Laterality: Right;   HIP PINNING,CANNULATED Right 03/10/2020   Procedure: INTERNAL FIXATION RIGHT HIP;  Surgeon: Carole Civil, MD;  Location: [facility];  Service: Orthopedics;  Laterality: Right;   ORIF WRIST FRACTURE  12/19/2011   Procedure: OPEN REDUCTION INTERNAL FIXATION (ORIF) WRIST FRACTURE;  Surgeon: Carole Civil, MD;  Location: [facility];  Service: Orthopedics;  Laterality: Right;   prostate cancer     Diagnosed in the 90s   TONSILLECTOMY      Social History   Socioeconomic History   Marital status: Widowed    Spouse name: Not on file   Number of children: Not on file   Years of education: Not on file   Highest education level: Not on file  Occupational History   Occupation: retired    Fish farm manager: RETIRED  Tobacco Use   Smoking status: Never   Smokeless tobacco: Never  Vaping Use   Vaping Use: Never used  Substance and Sexual Activity   Alcohol use: No   Drug use: No   Sexual activity: Not Currently  Other Topics Concern   Not on  file  Social History Narrative   Is long term resident of [facility]    Social Determinants of Health   Financial Resource Strain: Not on file  Food Insecurity: Not on file  Transportation Needs: Not on file  Physical Activity: Not on file  Stress: Not on file  Social Connections: Not on file  Intimate Partner Violence: Not on file   Family History  Problem Relation Age of Onset   Cancer Other    Heart attack Mother    Cancer Brother    Heart attack Brother    Heart disease Sister        by pass      VITAL SIGNS BP 140/88   Pulse 61   Temp 98 F (36.7 C)   Resp 16   Ht 5\' 9"  (1.753 m)   Wt 174 lb 9.6 oz (79.2 kg)   SpO2 98%   BMI 25.78 kg/m   Outpatient  Encounter Medications as of 05/14/2021  Medication Sig Note   acetaminophen (TYLENOL) 500 MG tablet Take 500 mg by mouth every 6 (six) hours. Max 3000mg  in 24 hour period    alfuzosin (UROXATRAL) 10 MG 24 hr tablet Take 10 mg by mouth daily with breakfast.    Ascorbic Acid (VITAMIN C) 1000 MG tablet Take 1,000 mg by mouth daily.    Balsam [place]-Castor Oil (VENELEX) OINT Apply 1 application topically every 8 (eight) hours as needed (Apply to bilateral buttocks & sacral area qshift for blanchable erythema.).    calcium carbonate (TUMS - DOSED IN MG ELEMENTAL CALCIUM) 500 MG chewable tablet Chew 1 tablet by mouth 2 (two) times daily as needed for indigestion or heartburn.     diltiazem (CARDIZEM CD) 360 MG 24 hr capsule Take 1 capsule (360 mg total) by mouth daily.    ferrous sulfate 325 (65 FE) MG tablet Take 325 mg by mouth daily with breakfast. For Anemia and  Iron Deficiency    Flax Oil-Fish Oil-Borage Oil (FISH OIL-FLAX OIL-BORAGE OIL) CAPS Take 1 capsule by mouth daily.    hydrochlorothiazide (MICROZIDE) 12.5 MG capsule Take 12.5 mg by mouth daily.    ipratropium-albuterol (DUONEB) 0.5-2.5 (3) MG/3ML SOLN Take 3 mLs by nebulization every 6 (six) hours as needed (shortness of breath or wheezing).    isosorbide mononitrate (IMDUR) 30 MG 24 hr tablet Take 1 tablet (30 mg total) by mouth daily.    lisinopril (ZESTRIL) 40 MG tablet Take 1 tablet (40 mg total) by mouth daily.    loperamide (IMODIUM A-D) 2 MG tablet Take 2 mg by mouth as needed (after each stool. (do not exceed more than 4 tabs in 24 hours)).    Melatonin 10 MG TABS Take 10 mg by mouth at bedtime.    meloxicam (MOBIC) 7.5 MG tablet Take 7.5 mg by mouth daily. give for left hip pain    memantine (NAMENDA) 10 MG tablet Take 10 mg by mouth 2 (two) times daily.    methocarbamol (ROBAXIN) 500 MG tablet Take 500 mg by mouth daily.    metoprolol tartrate (LOPRESSOR) 25 MG tablet Take 1 tablet (25 mg total) by mouth 2 (two) times daily.     omeprazole (PRILOSEC) 20 MG capsule Take 1 capsule (20 mg total) by mouth daily before breakfast.    ondansetron (ZOFRAN) 8 MG tablet Take 8 mg by mouth as needed for nausea.    OXYGEN Inhale 2 L into the lungs as needed (To keep O2 ststa > 90 %).  potassium chloride SA (KLOR-CON) 20 MEQ tablet Take 40 mEq by mouth 3 (three) times daily. 03/09/2021: Long term regimen. No duration specified.   QUEtiapine (SEROQUEL) 25 MG tablet Take 12.5 mg by mouth at bedtime.    rOPINIRole (REQUIP) 0.25 MG tablet Take 0.25 mg by mouth at bedtime.    senna-docusate (SENOKOT-S) 8.6-50 MG tablet Take 1 tablet by mouth at bedtime as needed for mild constipation.    simethicone (MYLICON) 824 MG chewable tablet Chew 125 mg by mouth 3 (three) times daily as needed ("increased gas").    spironolactone (ALDACTONE) 25 MG tablet Take 25 mg by mouth daily.    UNABLE TO FIND Med Name: Diet Regular    venlafaxine XR (EFFEXOR-XR) 150 MG 24 hr capsule Take 150 mg by mouth daily with breakfast.    Vibegron (GEMTESA) 75 MG TABS Take 1 tablet by mouth daily.    [DISCONTINUED] antiseptic oral rinse (BIOTENE) LIQD 10 mLs by Mouth Rinse route in the morning and at bedtime.    No facility-administered encounter medications on file as of 05/14/2021.     SIGNIFICANT DIAGNOSTIC EXAMS   PREVIOUS   06-09-20: ct of abdomen and pelvis:  Cholelithiasis. Haziness noted around the gallbladder. Cannot exclude acute cholecystitis. Continued wall thickening in the left colon from the distal transverse colon through the distal descending colon concerning for colitis. Left colonic diverticulosis. Small bilateral pleural effusions, increasing since prior study. Bibasilar atelectasis. Coronary artery disease, aortic atherosclerosis.  06-09-20: abdominal ultrasound:  1. Moderate gallbladder sludge with probable punctate stones. Slight increased wall thickness with small amount of fluid adjacent to the gallbladder but negative sonographic  Murphy. Findings are suggestive of but not definitive for cholecystitis; consider correlation with nuclear medicine hepatobiliary imaging. 6 mm gallbladder polyp. 2. Simple appearing renal cysts. 3. Incidental note made of pleural effusions.  06-12-20: HIDA:  1. No scintigraphic evidence of acute cholecystitis. 2. Findings compatible with severe biliary dyskinesia with little to no gallbladder emptying and elicitation of abdominal pain with the CCK administration.  06-13-20: chest x-ray:  Diffuse interstitial opacity with Awanda Mink lines with possible trace pleural fluid. Stable heart size and aortic contours. Negative for air leak  NO NEW EXAMS.    LABS REVIEWED PREVIOUS    05-22-20: wbc 19.4; hgb 14.6; hct 43.8; mcv 81.6 plt 254; glucose 211; bun 35; creat 1.20; k+ 3.0; na++ 138; ca 9.8; liver normal albumin 3.5 mag 1.9; urine culture: <10,000; blood culture: no growth 05-25-20: wbc 14.2; hgb 20.8; hct 34.8; mcv 84.7 plt 212; glucose 109; bun 24; creat 0.86; k+ 3.4; na++ 137; ca 8.9; mag 1.9 05-28-20: wbc 10.9; hgb 11.8; hct 36.8; mcv 81.8 plt 227; glucose 132; bun 10; creat 0.82; k+ 3.0; na++ 137; ca 8.7 06-01-20: wbc 12.2; hgb 12.5; hct 40.3; mcv 81.9 plt 136; glucose 136; bun 12; creat 0.81; k+ 3.0; na++ 140; ca 8.9 mag 1.9 06-05-20: wbc 13.6; hgb 10.2; hct 32.9; mcv 93.5 plt 266 glucose 125; bun 35; creat 1.26; k+ 3.5; na++ 137; ca 8.9  06-07-20: wbc 11.9; hgb 9.6; hct 29.8; mcv 82.8 plt 318; glucose 114; bun 42; creat 1.47; k+ 3.9; na++ 138; ca 8.8 GFR 46; liver normal albumin 2.1  06-08-20: glucose 107; bun 57; creat 1.81; k+ 4.3; na++ 135; ca 8.9 GFR 36 06-09-20: glucose 107; bun 52; creat 1.64; k+ 5.3; na++ 136; ca 9.3;  GFR 40; liver normal albumin 2.3 06-10-20: wbc 12.6; hgb 10.5; hct 34.2; mcv 83.4 plt 417; glucose 126; bun 39; creat  4.4; na++ 137; ca 9.5 GFR 58; liver normal albumin 2.2 06-12-20: wbc 8.2; hgb 10.4; hct 33.6; mcv 81.2 plt 364; glucose 123; bun 24; creat 0.87; k+ 3.5; na++  138; GFR >60; liver normal albumin 1.9 06-13-20: urine culture: <10,000 colonies 06-14-20: wbc 9.0; hgb 10.5; hct 33.8; mcv 80.5 plt 267; glucose 122; bun 20; creat 0.97; k+3.3; na++ 140; ca 9.0 GFR >60 06-29-20: wbc 10.1; hgb 10.7; hct 36.1; mcv 83.0 plt 294; glucose 152; bun 26; creat 1.02; k+ 4.4; na++ 138 ca 8.8 GFR>60 07-24-20 PSA 16.0 08-14-20: glucose 110; bun 33; creat 1.00 ;k+ 2.5 na++ 140 08-15-20: glucose 114; bun 31; creat  0.84; k+ 3.1; na++ 142 08-21-20: k+ 3.5 10-04-20: albumin 3.2  10-19-20: PSA 12.25 (13.05)  12-21-20: wbc 4.6; hgb 13.4 hct 40.9 mcv 86.1 plt 223; glucose 104; bun 22; creat 0.89; k+ 3.0; na++ 143; ca 9.3 GFR>60; liver normal albumin 3.3; chol 128; ldl 71; trig 85 hdl 40   12-25-20: k+ 3.2 01-01-21: k+ 3.2 01-08-21: k+ 4.5 01-19-21: glucose 108; bun 20; creat 0.96; k+ 3.6; na++ 139; ca 9.0 GFR>60 02-05-21: glucose 109; bun 25; creat 1.05; k+ 4.5; na++ 141; ca 9.5; GFR>60.  03-13-21: wbc 7.3; hgb 14.0; hct 42.5; mcv 85.5 plt 212; glucose 126; bun 24; creat 1.03 ;k+ 3.9; na++ 139; ca 9.6; GFR>60 03-15-21: hgb a1c 5.7 03-20-21: wbc 20.3; hgb 14.6; hct 44.4; mcv 86.7 plt 229; glcusoe 201; bun 26; creat 1.44; k+ 4.2; na++ 138; ca 9.2; GFR 46  03-22-21: aldosterone 7.6; cortisol AM 19.5;  03-26-21: wbc 6.3; hgb 12.4; hct 37.9; mcv 87.5 plt 230; glucose 117; bun 28; creat 1.13; k+ 4.4; na++ 138; ca 9.2; GFR>60   NO NEW LABS.   Review of Systems  Constitutional:  Negative for malaise/fatigue.  Respiratory:  Negative for cough and shortness of breath.   Cardiovascular:  Negative for chest pain, palpitations and leg swelling.  Gastrointestinal:  Negative for abdominal pain, constipation and heartburn.  Musculoskeletal:  Negative for back pain, joint pain and myalgias.  Skin: Negative.   Neurological:  Negative for dizziness.  Psychiatric/Behavioral:  The patient is not nervous/anxious.     Physical Exam Constitutional:      General: He is not in acute distress.    Appearance: He  is well-developed. He is not diaphoretic.  Neck:     Thyroid: No thyromegaly.  Cardiovascular:     Rate and Rhythm: Normal rate and regular rhythm.     Pulses: Normal pulses.     Heart sounds: Normal heart sounds.  Pulmonary:     Effort: Pulmonary effort is normal. No respiratory distress.     Breath sounds: Normal breath sounds.  Abdominal:     General: Bowel sounds are normal. There is no distension.     Palpations: Abdomen is soft.     Tenderness: There is no abdominal tenderness.  Musculoskeletal:        General: Normal range of motion.     Cervical back: Neck supple.  Lymphadenopathy:     Cervical: No cervical adenopathy.  Skin:    General: Skin is warm and dry.  Neurological:     Mental Status: He is alert. Mental status is at baseline.  Psychiatric:        Mood and Affect: Mood normal.      ASSESSMENT/ PLAN:  TODAY  Prostate cancer/overactive bladder: is stable will continue urosatral 10 mg daily no oxybutrin due to history of fracture; off mybetriq due to hypertension;  is followed by urology   2. Hypokalemia: is stable k+ 3.9; will continue k+ 20 meq twice daily '  3. Dyslipidemia is stable will continue fish oil daily is off zocor  4. Chronic constipation will continue miralax daily and senna s as needed   PREVIOUS   5. Major depression recurrent chronic: is stable will continue effexor xr 150 mg daily and melatonin 10 mg nightly for sleep seroquel 12.5 mg nightly   6.RLS: is stable will continue requip 0.25 mg nightly   7. Iron deficiency anemia due to dietary intake: is stable hgb 14.0 will continue iron daily  8. Interstitial lung disease: is stable has prn 02  9. Left hip pain: is stable will continue voltaren gel 4 gm twice daily   10. Dementia without behavioral disturbance unspecified dementia type: is stable weight is 174 pounds; will continue namenda 10 mg twice daily   11. Aortic atherosclerosis (ct 06-09-20) will continue asa 81 mg daily    12. Protein calorie malnutrition severe: is stable albumin 3.2 will continue prostat 20 mL daily  13. Benign hypertension with chronic kidney disease stage III: is stable b/p 140/88 will continue lopressor 25 mg twice daily cardizem cd 360 mg daily lisinopril hct 40/125 mg daily  14. Gastroesophageal reflux disease without esophagitis: is stable will continue prilosec 20 mg daily      Ok Edwards NP [facility]  Contact [phone] Monday through Friday 8am- 5pm  After hours call [phone]

## 2021-05-15 DIAGNOSIS — M6281 Muscle weakness (generalized): Secondary | ICD-10-CM | POA: Diagnosis not present

## 2021-05-15 DIAGNOSIS — R2681 Unsteadiness on feet: Secondary | ICD-10-CM | POA: Diagnosis not present

## 2021-05-15 DIAGNOSIS — Z472 Encounter for removal of internal fixation device: Secondary | ICD-10-CM | POA: Diagnosis not present

## 2021-05-15 DIAGNOSIS — R262 Difficulty in walking, not elsewhere classified: Secondary | ICD-10-CM | POA: Diagnosis not present

## 2021-05-15 DIAGNOSIS — Z48815 Encounter for surgical aftercare following surgery on the digestive system: Secondary | ICD-10-CM | POA: Diagnosis not present

## 2021-05-15 DIAGNOSIS — Z9181 History of falling: Secondary | ICD-10-CM | POA: Diagnosis not present

## 2021-05-16 ENCOUNTER — Encounter: Payer: Self-pay | Admitting: Adult Health

## 2021-05-16 DIAGNOSIS — Z48815 Encounter for surgical aftercare following surgery on the digestive system: Secondary | ICD-10-CM | POA: Diagnosis not present

## 2021-05-16 DIAGNOSIS — Z472 Encounter for removal of internal fixation device: Secondary | ICD-10-CM | POA: Diagnosis not present

## 2021-05-16 DIAGNOSIS — M6281 Muscle weakness (generalized): Secondary | ICD-10-CM | POA: Diagnosis not present

## 2021-05-16 DIAGNOSIS — R2681 Unsteadiness on feet: Secondary | ICD-10-CM | POA: Diagnosis not present

## 2021-05-16 DIAGNOSIS — Z9181 History of falling: Secondary | ICD-10-CM | POA: Diagnosis not present

## 2021-05-16 DIAGNOSIS — F331 Major depressive disorder, recurrent, moderate: Secondary | ICD-10-CM | POA: Diagnosis not present

## 2021-05-16 DIAGNOSIS — R262 Difficulty in walking, not elsewhere classified: Secondary | ICD-10-CM | POA: Diagnosis not present

## 2021-05-17 DIAGNOSIS — R262 Difficulty in walking, not elsewhere classified: Secondary | ICD-10-CM | POA: Diagnosis not present

## 2021-05-17 DIAGNOSIS — Z9181 History of falling: Secondary | ICD-10-CM | POA: Diagnosis not present

## 2021-05-17 DIAGNOSIS — R2681 Unsteadiness on feet: Secondary | ICD-10-CM | POA: Diagnosis not present

## 2021-05-17 DIAGNOSIS — M6281 Muscle weakness (generalized): Secondary | ICD-10-CM | POA: Diagnosis not present

## 2021-05-17 DIAGNOSIS — Z472 Encounter for removal of internal fixation device: Secondary | ICD-10-CM | POA: Diagnosis not present

## 2021-05-17 DIAGNOSIS — Z48815 Encounter for surgical aftercare following surgery on the digestive system: Secondary | ICD-10-CM | POA: Diagnosis not present

## 2021-05-18 DIAGNOSIS — R262 Difficulty in walking, not elsewhere classified: Secondary | ICD-10-CM | POA: Diagnosis not present

## 2021-05-18 DIAGNOSIS — Z472 Encounter for removal of internal fixation device: Secondary | ICD-10-CM | POA: Diagnosis not present

## 2021-05-18 DIAGNOSIS — R2681 Unsteadiness on feet: Secondary | ICD-10-CM | POA: Diagnosis not present

## 2021-05-18 DIAGNOSIS — Z9181 History of falling: Secondary | ICD-10-CM | POA: Diagnosis not present

## 2021-05-18 DIAGNOSIS — Z48815 Encounter for surgical aftercare following surgery on the digestive system: Secondary | ICD-10-CM | POA: Diagnosis not present

## 2021-05-18 DIAGNOSIS — M6281 Muscle weakness (generalized): Secondary | ICD-10-CM | POA: Diagnosis not present

## 2021-05-21 DIAGNOSIS — M6281 Muscle weakness (generalized): Secondary | ICD-10-CM | POA: Diagnosis not present

## 2021-05-21 DIAGNOSIS — R262 Difficulty in walking, not elsewhere classified: Secondary | ICD-10-CM | POA: Diagnosis not present

## 2021-05-21 DIAGNOSIS — Z48815 Encounter for surgical aftercare following surgery on the digestive system: Secondary | ICD-10-CM | POA: Diagnosis not present

## 2021-05-21 DIAGNOSIS — Z9181 History of falling: Secondary | ICD-10-CM | POA: Diagnosis not present

## 2021-05-21 DIAGNOSIS — Z472 Encounter for removal of internal fixation device: Secondary | ICD-10-CM | POA: Diagnosis not present

## 2021-05-21 DIAGNOSIS — R2681 Unsteadiness on feet: Secondary | ICD-10-CM | POA: Diagnosis not present

## 2021-05-22 DIAGNOSIS — R2681 Unsteadiness on feet: Secondary | ICD-10-CM | POA: Diagnosis not present

## 2021-05-22 DIAGNOSIS — Z9181 History of falling: Secondary | ICD-10-CM | POA: Diagnosis not present

## 2021-05-22 DIAGNOSIS — R262 Difficulty in walking, not elsewhere classified: Secondary | ICD-10-CM | POA: Diagnosis not present

## 2021-05-22 DIAGNOSIS — Z472 Encounter for removal of internal fixation device: Secondary | ICD-10-CM | POA: Diagnosis not present

## 2021-05-22 DIAGNOSIS — M6281 Muscle weakness (generalized): Secondary | ICD-10-CM | POA: Diagnosis not present

## 2021-05-22 DIAGNOSIS — Z48815 Encounter for surgical aftercare following surgery on the digestive system: Secondary | ICD-10-CM | POA: Diagnosis not present

## 2021-05-23 DIAGNOSIS — M6281 Muscle weakness (generalized): Secondary | ICD-10-CM | POA: Diagnosis not present

## 2021-05-23 DIAGNOSIS — Z472 Encounter for removal of internal fixation device: Secondary | ICD-10-CM | POA: Diagnosis not present

## 2021-05-23 DIAGNOSIS — R262 Difficulty in walking, not elsewhere classified: Secondary | ICD-10-CM | POA: Diagnosis not present

## 2021-05-23 DIAGNOSIS — R2681 Unsteadiness on feet: Secondary | ICD-10-CM | POA: Diagnosis not present

## 2021-05-23 DIAGNOSIS — Z9181 History of falling: Secondary | ICD-10-CM | POA: Diagnosis not present

## 2021-05-23 DIAGNOSIS — Z48815 Encounter for surgical aftercare following surgery on the digestive system: Secondary | ICD-10-CM | POA: Diagnosis not present

## 2021-05-24 DIAGNOSIS — F331 Major depressive disorder, recurrent, moderate: Secondary | ICD-10-CM | POA: Diagnosis not present

## 2021-05-25 DIAGNOSIS — R2681 Unsteadiness on feet: Secondary | ICD-10-CM | POA: Diagnosis not present

## 2021-05-25 DIAGNOSIS — Z9181 History of falling: Secondary | ICD-10-CM | POA: Diagnosis not present

## 2021-05-25 DIAGNOSIS — Z472 Encounter for removal of internal fixation device: Secondary | ICD-10-CM | POA: Diagnosis not present

## 2021-05-25 DIAGNOSIS — M6281 Muscle weakness (generalized): Secondary | ICD-10-CM | POA: Diagnosis not present

## 2021-05-25 DIAGNOSIS — Z48815 Encounter for surgical aftercare following surgery on the digestive system: Secondary | ICD-10-CM | POA: Diagnosis not present

## 2021-05-25 DIAGNOSIS — R262 Difficulty in walking, not elsewhere classified: Secondary | ICD-10-CM | POA: Diagnosis not present

## 2021-05-29 DIAGNOSIS — F411 Generalized anxiety disorder: Secondary | ICD-10-CM | POA: Diagnosis not present

## 2021-05-29 DIAGNOSIS — F321 Major depressive disorder, single episode, moderate: Secondary | ICD-10-CM | POA: Diagnosis not present

## 2021-05-29 DIAGNOSIS — F319 Bipolar disorder, unspecified: Secondary | ICD-10-CM | POA: Diagnosis not present

## 2021-05-29 DIAGNOSIS — G3184 Mild cognitive impairment, so stated: Secondary | ICD-10-CM | POA: Diagnosis not present

## 2021-06-11 DIAGNOSIS — K529 Noninfective gastroenteritis and colitis, unspecified: Secondary | ICD-10-CM | POA: Diagnosis not present

## 2021-06-11 DIAGNOSIS — Z1159 Encounter for screening for other viral diseases: Secondary | ICD-10-CM | POA: Diagnosis not present

## 2021-06-13 ENCOUNTER — Encounter: Payer: Self-pay | Admitting: Adult Health

## 2021-06-13 ENCOUNTER — Non-Acute Institutional Stay (SKILLED_NURSING_FACILITY): Payer: Medicare Other | Admitting: Adult Health

## 2021-06-13 DIAGNOSIS — E43 Unspecified severe protein-calorie malnutrition: Secondary | ICD-10-CM

## 2021-06-13 DIAGNOSIS — F0390 Unspecified dementia without behavioral disturbance: Secondary | ICD-10-CM | POA: Diagnosis not present

## 2021-06-13 DIAGNOSIS — S72001A Fracture of unspecified part of neck of right femur, initial encounter for closed fracture: Secondary | ICD-10-CM | POA: Diagnosis not present

## 2021-06-13 DIAGNOSIS — F339 Major depressive disorder, recurrent, unspecified: Secondary | ICD-10-CM

## 2021-06-13 DIAGNOSIS — J849 Interstitial pulmonary disease, unspecified: Secondary | ICD-10-CM

## 2021-06-13 DIAGNOSIS — Z66 Do not resuscitate: Secondary | ICD-10-CM

## 2021-06-13 DIAGNOSIS — N1832 Chronic kidney disease, stage 3b: Secondary | ICD-10-CM

## 2021-06-13 DIAGNOSIS — K219 Gastro-esophageal reflux disease without esophagitis: Secondary | ICD-10-CM

## 2021-06-13 DIAGNOSIS — C61 Malignant neoplasm of prostate: Secondary | ICD-10-CM | POA: Diagnosis not present

## 2021-06-13 DIAGNOSIS — G2581 Restless legs syndrome: Secondary | ICD-10-CM

## 2021-06-13 DIAGNOSIS — N183 Chronic kidney disease, stage 3 unspecified: Secondary | ICD-10-CM

## 2021-06-13 DIAGNOSIS — D508 Other iron deficiency anemias: Secondary | ICD-10-CM

## 2021-06-13 DIAGNOSIS — K5909 Other constipation: Secondary | ICD-10-CM

## 2021-06-13 DIAGNOSIS — I700 Atherosclerosis of aorta: Secondary | ICD-10-CM

## 2021-06-13 DIAGNOSIS — I129 Hypertensive chronic kidney disease with stage 1 through stage 4 chronic kidney disease, or unspecified chronic kidney disease: Secondary | ICD-10-CM

## 2021-06-13 DIAGNOSIS — N3281 Overactive bladder: Secondary | ICD-10-CM | POA: Diagnosis not present

## 2021-06-13 NOTE — Progress Notes (Signed)
Location:  [facility] Room Number: 156-D Place of Service:  SNF (31)   CODE STATUS: DNR  Allergies  Allergen Reactions   Hctz [Hydrochlorothiazide]     hypokalemia    Chief Complaint  Patient presents with   Medical Management of Chronic Issues    Extended visit for yearly exam.     HPI:  He is a 88 year old long term resident of this facility being seen for his annual exam. He has been hospitalized last January for cholecystectomy. He was hospitalized in October to remove his hip hardware. He is ambulatory with a walker. His weight is stable. He does complain of increased anxiety; stating the anxiety is interfering with his daily life and his ability to sleep. He continues to be followed for his chronic illnesses including: Major depression recurrent chronic: RLS:  Iron deficiency anemia due to dietary intake: Interstitial long disease:  Left hip pain:  Past Medical History:  Diagnosis Date   Anxiety    Cancer ([place])    prostate   Chronic chest wall pain    Essential hypertension, benign 06/20/2016   GAD (generalized anxiety disorder)    GERD (gastroesophageal reflux disease)    H/O echocardiogram 2016   normal   Heart murmur    "leaking heart valve"   High cholesterol 06/20/2016   HOH (hard of hearing)    left   Hx of falling    Interstitial pulmonary disease (HCC)    Lower GI bleed    MDD (major depressive disorder)    Murmur, cardiac    Poor historian    PUD (peptic ulcer disease)    RLS (restless legs syndrome)    Shortness of breath    Wrist fracture 12/16/2011    Past Surgical History:  Procedure Laterality Date   CHOLECYSTECTOMY N/A 06/28/2020   Procedure: LAPAROSCOPIC CHOLECYSTECTOMY;  Surgeon: [name], MD;  Location: [facility];  Service: General;  Laterality: N/A;  pt in [place]  2005   Rehman: Sigmoid colon diverticulosis, moderate sized external hemorrhoids. A few focal areas of mucosal erythema felt  to be nonspecific.   ESOPHAGOGASTRODUODENOSCOPY     ESOPHAGOGASTRODUODENOSCOPY N/A 07/18/2016   Rehman: normal exam except duodenal scar. h.pylori serologies negative   FLEXIBLE SIGMOIDOSCOPY N/A 06/11/2020   Procedure: FLEXIBLE SIGMOIDOSCOPY;  Surgeon: Eloise Harman, DO;  Location: [facility];  Service: Endoscopy;  Laterality: N/A;   HARDWARE REMOVAL Right 03/20/2021   Procedure: HARDWARE REMOVAL right hip;  Surgeon: Carole Civil, MD;  Location: [facility];  Service: Orthopedics;  Laterality: Right;   HIP PINNING,CANNULATED Right 03/10/2020   Procedure: INTERNAL FIXATION RIGHT HIP;  Surgeon: Carole Civil, MD;  Location: [facility];  Service: Orthopedics;  Laterality: Right;   ORIF WRIST FRACTURE  12/19/2011   Procedure: OPEN REDUCTION INTERNAL FIXATION (ORIF) WRIST FRACTURE;  Surgeon: Carole Civil, MD;  Location: [facility];  Service: Orthopedics;  Laterality: Right;   prostate cancer     Diagnosed in the 90s   TONSILLECTOMY      Social History   Socioeconomic History   Marital status: Widowed    Spouse name: Not on file   Number of children: Not on file   Years of education: Not on file   Highest education level: Not on file  Occupational History   Occupation: retired    Fish farm manager: RETIRED  Tobacco Use   Smoking status: Never   Smokeless tobacco: Never  Vaping Use  Vaping Use: Never used  Substance and Sexual Activity   Alcohol use: No   Drug use: No   Sexual activity: Not Currently  Other Topics Concern   Not on file  Social History Narrative   Is long term resident of [facility]    Social Determinants of Health   Financial Resource Strain: Not on file  Food Insecurity: Not on file  Transportation Needs: Not on file  Physical Activity: Not on file  Stress: Not on file  Social Connections: Not on file  Intimate Partner Violence: Not on file   Family History  Problem Relation Age of Onset   Cancer Other    Heart attack Mother    Cancer Brother    Heart  attack Brother    Heart disease Sister        by pass      VITAL SIGNS BP 138/79    Pulse 61    Temp 98.6 F (37 C)    Resp 16    Ht 5\' 9"  (1.753 m)    Wt 174 lb 9.6 oz (79.2 kg)    SpO2 98%    BMI 25.78 kg/m   Outpatient Encounter Medications as of 06/13/2021  Medication Sig Note   acetaminophen (TYLENOL) 500 MG tablet Take 500 mg by mouth every 6 (six) hours. Max 3000mg  in 24 hour period    alfuzosin (UROXATRAL) 10 MG 24 hr tablet Take 10 mg by mouth daily. 6 pm    Ascorbic Acid (VITAMIN C) 1000 MG tablet Take 1,000 mg by mouth daily.    Balsam [place]-Castor Oil (VENELEX) OINT Apply 1 application topically every 8 (eight) hours as needed (Apply to bilateral buttocks & sacral area qshift for blanchable erythema.).    calcium carbonate (TUMS - DOSED IN MG ELEMENTAL CALCIUM) 500 MG chewable tablet Chew 1 tablet by mouth 2 (two) times daily as needed for indigestion or heartburn.     diltiazem (CARDIZEM CD) 360 MG 24 hr capsule Take 1 capsule (360 mg total) by mouth daily.    ferrous sulfate 325 (65 FE) MG tablet Take 325 mg by mouth daily with breakfast. For Anemia and  Iron Deficiency    Flax Oil-Fish Oil-Borage Oil (FISH OIL-FLAX OIL-BORAGE OIL) CAPS Take 1 capsule by mouth daily.    hydrochlorothiazide (MICROZIDE) 12.5 MG capsule Take 12.5 mg by mouth daily.    ipratropium-albuterol (DUONEB) 0.5-2.5 (3) MG/3ML SOLN Take 3 mLs by nebulization every 6 (six) hours as needed (shortness of breath or wheezing).    isosorbide mononitrate (IMDUR) 30 MG 24 hr tablet Take 1 tablet (30 mg total) by mouth daily.    lisinopril (ZESTRIL) 40 MG tablet Take 1 tablet (40 mg total) by mouth daily.    loperamide (IMODIUM A-D) 2 MG tablet Take 2 mg by mouth as needed (after each stool. (do not exceed more than 4 tabs in 24 hours)).    loratadine (CLARITIN) 10 MG tablet Take 10 mg by mouth daily.    Melatonin 10 MG TABS Take 10 mg by mouth at bedtime.    meloxicam (MOBIC) 7.5 MG tablet Take 7.5 mg by mouth  daily. give for left hip pain    memantine (NAMENDA) 10 MG tablet Take 10 mg by mouth 2 (two) times daily.    methocarbamol (ROBAXIN) 500 MG tablet Take 500 mg by mouth daily.    metoprolol tartrate (LOPRESSOR) 25 MG tablet Take 1 tablet (25 mg total) by mouth 2 (two) times daily.    omeprazole (  PRILOSEC) 20 MG capsule Take 1 capsule (20 mg total) by mouth daily before breakfast.    ondansetron (ZOFRAN) 8 MG tablet Take 8 mg by mouth as needed for nausea.    OXYGEN Inhale 2 L into the lungs as needed (To keep O2 ststa > 90 %).    polyethylene glycol (MIRALAX / GLYCOLAX) 17 g packet Take 17 g by mouth daily. On Tuesday, Thursday, and Saturday    potassium chloride SA (KLOR-CON) 20 MEQ tablet Take 40 mEq by mouth 3 (three) times daily. 03/09/2021: Long term regimen. No duration specified.   QUEtiapine (SEROQUEL) 25 MG tablet Take 12.5 mg by mouth at bedtime.    rOPINIRole (REQUIP) 0.25 MG tablet Take 0.25 mg by mouth at bedtime.    senna-docusate (SENOKOT-S) 8.6-50 MG tablet Take 1 tablet by mouth at bedtime as needed for mild constipation.    simethicone (MYLICON) 778 MG chewable tablet Chew 125 mg by mouth 3 (three) times daily as needed ("increased gas").    spironolactone (ALDACTONE) 25 MG tablet Take 25 mg by mouth daily.    UNABLE TO FIND Med Name: Diet Regular    venlafaxine XR (EFFEXOR-XR) 150 MG 24 hr capsule Take 150 mg by mouth daily with breakfast.    Vibegron (GEMTESA) 75 MG TABS Take 1 tablet by mouth daily.    No facility-administered encounter medications on file as of 06/13/2021.     SIGNIFICANT DIAGNOSTIC EXAMS   PREVIOUS   06-09-20: ct of abdomen and pelvis:  Cholelithiasis. Haziness noted around the gallbladder. Cannot exclude acute cholecystitis. Continued wall thickening in the left colon from the distal transverse colon through the distal descending colon concerning for colitis. Left colonic diverticulosis. Small bilateral pleural effusions, increasing since prior  study. Bibasilar atelectasis. Coronary artery disease, aortic atherosclerosis.  06-09-20: abdominal ultrasound:  1. Moderate gallbladder sludge with probable punctate stones. Slight increased wall thickness with small amount of fluid adjacent to the gallbladder but negative sonographic Murphy. Findings are suggestive of but not definitive for cholecystitis; consider correlation with nuclear medicine hepatobiliary imaging. 6 mm gallbladder polyp. 2. Simple appearing renal cysts. 3. Incidental note made of pleural effusions.  06-12-20: HIDA:  1. No scintigraphic evidence of acute cholecystitis. 2. Findings compatible with severe biliary dyskinesia with little to no gallbladder emptying and elicitation of abdominal pain with the CCK administration.  06-13-20: chest x-ray:  Diffuse interstitial opacity with Awanda Mink lines with possible trace pleural fluid. Stable heart size and aortic contours. Negative for air leak  NO NEW EXAMS.    LABS REVIEWED PREVIOUS    06-10-20: wbc 12.6; hgb 10.5; hct 34.2; mcv 83.4 plt 417; glucose 126; bun 39; creat 4.4; na++ 137; ca 9.5 GFR 58; liver normal albumin 2.2 06-12-20: wbc 8.2; hgb 10.4; hct 33.6; mcv 81.2 plt 364; glucose 123; bun 24; creat 0.87; k+ 3.5; na++ 138; GFR >60; liver normal albumin 1.9 06-13-20: urine culture: <10,000 colonies 06-14-20: wbc 9.0; hgb 10.5; hct 33.8; mcv 80.5 plt 267; glucose 122; bun 20; creat 0.97; k+3.3; na++ 140; ca 9.0 GFR >60 06-29-20: wbc 10.1; hgb 10.7; hct 36.1; mcv 83.0 plt 294; glucose 152; bun 26; creat 1.02; k+ 4.4; na++ 138 ca 8.8 GFR>60 07-24-20 PSA 16.0 08-14-20: glucose 110; bun 33; creat 1.00 ;k+ 2.5 na++ 140 08-15-20: glucose 114; bun 31; creat  0.84; k+ 3.1; na++ 142 08-21-20: k+ 3.5 10-04-20: albumin 3.2  10-19-20: PSA 12.25 (13.05)  12-21-20: wbc 4.6; hgb 13.4 hct 40.9 mcv 86.1 plt 223; glucose 104; bun  22; creat 0.89; k+ 3.0; na++ 143; ca 9.3 GFR>60; liver normal albumin 3.3; chol 128; ldl 71; trig 85 hdl 40   12-25-20: k+  3.2 01-01-21: k+ 3.2 01-08-21: k+ 4.5 01-19-21: glucose 108; bun 20; creat 0.96; k+ 3.6; na++ 139; ca 9.0 GFR>60 02-05-21: glucose 109; bun 25; creat 1.05; k+ 4.5; na++ 141; ca 9.5; GFR>60.  03-13-21: wbc 7.3; hgb 14.0; hct 42.5; mcv 85.5 plt 212; glucose 126; bun 24; creat 1.03 ;k+ 3.9; na++ 139; ca 9.6; GFR>60 03-15-21: hgb a1c 5.7 03-20-21: wbc 20.3; hgb 14.6; hct 44.4; mcv 86.7 plt 229; glcusoe 201; bun 26; creat 1.44; k+ 4.2; na++ 138; ca 9.2; GFR 46  03-22-21: aldosterone 7.6; cortisol AM 19.5;  03-26-21: wbc 6.3; hgb 12.4; hct 37.9; mcv 87.5 plt 230; glucose 117; bun 28; creat 1.13; k+ 4.4; na++ 138; ca 9.2; GFR>60   NO NEW LABS.   Review of Systems  Constitutional:  Negative for malaise/fatigue.  Respiratory:  Negative for cough and shortness of breath.   Cardiovascular:  Negative for chest pain, palpitations and leg swelling.  Gastrointestinal:  Negative for abdominal pain, constipation and heartburn.  Musculoskeletal:  Negative for back pain, joint pain and myalgias.  Skin: Negative.   Neurological:  Negative for dizziness.  Psychiatric/Behavioral:  The patient is not nervous/anxious.      Physical Exam Constitutional:      General: He is not in acute distress.    Appearance: He is well-developed. He is not diaphoretic.  HENT:     Right Ear: Tympanic membrane normal.     Left Ear: Tympanic membrane normal.     Nose: Nose normal.     Mouth/Throat:     Mouth: Mucous membranes are moist.     Pharynx: Oropharynx is clear.  Eyes:     Conjunctiva/sclera: Conjunctivae normal.  Neck:     Thyroid: No thyromegaly.  Cardiovascular:     Rate and Rhythm: Normal rate and regular rhythm.     Pulses: Normal pulses.     Heart sounds: Normal heart sounds.  Pulmonary:     Effort: Pulmonary effort is normal. No respiratory distress.     Breath sounds: Normal breath sounds.  Abdominal:     General: Bowel sounds are normal. There is no distension.     Palpations: Abdomen is soft.      Tenderness: There is no abdominal tenderness.  Musculoskeletal:        General: Normal range of motion.     Cervical back: Neck supple.     Right lower leg: No edema.     Left lower leg: No edema.  Lymphadenopathy:     Cervical: No cervical adenopathy.  Skin:    General: Skin is warm and dry.  Neurological:     Mental Status: He is alert. Mental status is at baseline.  Psychiatric:        Mood and Affect: Mood normal.     ASSESSMENT/ PLAN:  TODAY  Major depression recurrent chronic: continues to have anxiety: will continue effexor xr 150 mg daily; melatonin 10 mg nightly and will increase seroquel to 12.5 mg twice daily will monitor   2. RLS: is stable will continue requip 0.25 mg nightly   3. Iron deficiency anemia due to dietary intake: is stable hgb 14.0 will stop iron   4. Interstitial long disease: has duoneb every 6 hours as needed; has prn 02 claritin 10 mg daily   5. Left hip pain: has had hardware removed; will  continue mobic 7.5 mg daily robaxin 500 mg daily   6. Dementia without behavioral disturbance unspecified dementia type: is stable weight is 174 pounds; will continue namenda 10 mg twice daily   7. Aortic atherosclerosis: (ct 06-09-20) will monitor asa 81 mg daily   8. Protein calorie malnutrition severe: is stable albumin 3.2 will continue to monitor   9. Benign hypertension with chronic kidney disease stage III: is stable b/p 140/88 will continue lopressor 25 mg twice daily cardizem cd 360 mg daily lisinopril 40 mg daily hctz 12.5 mg daily aldactone 25 mg daily   10. Gastroesophageal reflux disease without esophagitis: is stable will continue prilosec 25 mg daily   11. Prostate cancer/over active bladder: is stable will continue urosatral 10 mg daily gemtesa 75 mg daily; no oxybutynin due to history of fracture; off mybtriq due to hypertension followed by urology   12.  Hypokalemia: is stable k+ 3.9 will continue k+ 20 meq twice daily   13. Dyslipidemia:  is stable will continue fish oil daily is off zocor  14. Chronic constipation: will continue miralax daily and senna s as needed   15. CKD stage 3b: is stable bun 28 creat 1.13 GFR >60        Ok Edwards NP [facility]  call [phone]

## 2021-06-15 ENCOUNTER — Encounter: Payer: Self-pay | Admitting: Adult Health

## 2021-06-21 DIAGNOSIS — Z1159 Encounter for screening for other viral diseases: Secondary | ICD-10-CM | POA: Diagnosis not present

## 2021-06-21 DIAGNOSIS — K529 Noninfective gastroenteritis and colitis, unspecified: Secondary | ICD-10-CM | POA: Diagnosis not present

## 2021-06-22 ENCOUNTER — Encounter: Payer: Self-pay | Admitting: Adult Health

## 2021-06-22 ENCOUNTER — Non-Acute Institutional Stay (SKILLED_NURSING_FACILITY): Payer: Medicare Other | Admitting: Adult Health

## 2021-06-22 DIAGNOSIS — H01005 Unspecified blepharitis left lower eyelid: Secondary | ICD-10-CM

## 2021-06-22 DIAGNOSIS — F331 Major depressive disorder, recurrent, moderate: Secondary | ICD-10-CM | POA: Diagnosis not present

## 2021-06-22 NOTE — Progress Notes (Signed)
Location:  [facility] Room Number: 156-D Place of Service:  SNF (31)   CODE STATUS: DNR  Allergies  Allergen Reactions   Hctz [Hydrochlorothiazide]     hypokalemia    Chief Complaint  Patient presents with   Acute Visit    Right eye infection    HPI:  His right lower lid has been noted to be red swollen and inflamed. There are no reports of fevers present. He does have some pain and itching present.   Past Medical History:  Diagnosis Date   Anxiety    Cancer [facility])    prostate   Chronic chest wall pain    Essential hypertension, benign 06/20/2016   GAD (generalized anxiety disorder)    GERD (gastroesophageal reflux disease)    H/O echocardiogram 2016   normal   Heart murmur    "leaking heart valve"   High cholesterol 06/20/2016   HOH (hard of hearing)    left   Hx of falling    Interstitial pulmonary disease (HCC)    Lower GI bleed    MDD (major depressive disorder)    Murmur, cardiac    Poor historian    PUD (peptic ulcer disease)    RLS (restless legs syndrome)    Shortness of breath    Wrist fracture 12/16/2011    Past Surgical History:  Procedure Laterality Date   CHOLECYSTECTOMY N/A 06/28/2020   Procedure: LAPAROSCOPIC CHOLECYSTECTOMY;  Surgeon: [name], MD;  Location: [facility] ORS;  Service: General;  Laterality: N/A;  pt in [place]  2005   Rehman: Sigmoid colon diverticulosis, moderate sized external hemorrhoids. A few focal areas of mucosal erythema felt to be nonspecific.   ESOPHAGOGASTRODUODENOSCOPY     ESOPHAGOGASTRODUODENOSCOPY N/A 07/18/2016   Rehman: normal exam except duodenal scar. h.pylori serologies negative   FLEXIBLE SIGMOIDOSCOPY N/A 06/11/2020   Procedure: FLEXIBLE SIGMOIDOSCOPY;  Surgeon: Eloise Harman, DO;  Location: [facility] ENDO SUITE;  Service: Endoscopy;  Laterality: N/A;   HARDWARE REMOVAL Right 03/20/2021   Procedure: HARDWARE REMOVAL right hip;  Surgeon: Carole Civil, MD;   Location: [facility] ORS;  Service: Orthopedics;  Laterality: Right;   HIP PINNING,CANNULATED Right 03/10/2020   Procedure: INTERNAL FIXATION RIGHT HIP;  Surgeon: Carole Civil, MD;  Location: [facility] ORS;  Service: Orthopedics;  Laterality: Right;   ORIF WRIST FRACTURE  12/19/2011   Procedure: OPEN REDUCTION INTERNAL FIXATION (ORIF) WRIST FRACTURE;  Surgeon: Carole Civil, MD;  Location: [facility] ORS;  Service: Orthopedics;  Laterality: Right;   prostate cancer     Diagnosed in the 90s   TONSILLECTOMY      Social History   Socioeconomic History   Marital status: Widowed    Spouse name: Not on file   Number of children: Not on file   Years of education: Not on file   Highest education level: Not on file  Occupational History   Occupation: retired    Fish farm manager: RETIRED  Tobacco Use   Smoking status: Never   Smokeless tobacco: Never  Vaping Use   Vaping Use: Never used  Substance and Sexual Activity   Alcohol use: No   Drug use: No   Sexual activity: Not Currently  Other Topics Concern   Not on file  Social History Narrative   Is long term resident of [facility]    Social Determinants of Health   Financial Resource Strain: Not on file  Food Insecurity: Not on file  Transportation Needs: Not  on file  Physical Activity: Not on file  Stress: Not on file  Social Connections: Not on file  Intimate Partner Violence: Not on file   Family History  Problem Relation Age of Onset   Cancer Other    Heart attack Mother    Cancer Brother    Heart attack Brother    Heart disease Sister        by pass      VITAL SIGNS BP 140/89    Pulse 63    Temp (!) 97.5 F (36.4 C)    Resp 16    Ht 5\' 9"  (1.753 m)    Wt 174 lb 9.6 oz (79.2 kg)    SpO2 98%    BMI 25.78 kg/m   Outpatient Encounter Medications as of 06/22/2021  Medication Sig Note   acetaminophen (TYLENOL) 500 MG tablet Take 500 mg by mouth every 6 (six) hours. Max 3000mg  in 24 hour period    alfuzosin (UROXATRAL) 10 MG 24 hr tablet Take  10 mg by mouth daily. 6 pm    Ascorbic Acid (VITAMIN C) 1000 MG tablet Take 1,000 mg by mouth daily.    Balsam [place]-Castor Oil (VENELEX) OINT Apply 1 application topically every 8 (eight) hours as needed (Apply to bilateral buttocks & sacral area qshift for blanchable erythema.).    calcium carbonate (TUMS - DOSED IN MG ELEMENTAL CALCIUM) 500 MG chewable tablet Chew 1 tablet by mouth 2 (two) times daily as needed for indigestion or heartburn.     diltiazem (CARDIZEM CD) 360 MG 24 hr capsule Take 1 capsule (360 mg total) by mouth daily.    ERYTHROMYCIN OP ointment; 5 mg/gram (0.5 %); amt: 1 application; ophthalmic (eye) Special Instructions: apply to left lower eyelid twice daily Twice A Day    Flax Oil-Fish Oil-Borage Oil (FISH OIL-FLAX OIL-BORAGE OIL) CAPS Take 1 capsule by mouth daily.    hydrochlorothiazide (MICROZIDE) 12.5 MG capsule Take 12.5 mg by mouth daily.    ipratropium-albuterol (DUONEB) 0.5-2.5 (3) MG/3ML SOLN Take 3 mLs by nebulization every 6 (six) hours as needed (shortness of breath or wheezing).    isosorbide mononitrate (IMDUR) 30 MG 24 hr tablet Take 1 tablet (30 mg total) by mouth daily.    lisinopril (ZESTRIL) 40 MG tablet Take 1 tablet (40 mg total) by mouth daily.    loperamide (IMODIUM A-D) 2 MG tablet Take 2 mg by mouth as needed (after each stool. (do not exceed more than 4 tabs in 24 hours)).    loratadine (CLARITIN) 10 MG tablet Take 10 mg by mouth daily.    Melatonin 10 MG TABS Take 10 mg by mouth at bedtime.    meloxicam (MOBIC) 7.5 MG tablet Take 7.5 mg by mouth daily. give for left hip pain    memantine (NAMENDA) 10 MG tablet Take 10 mg by mouth 2 (two) times daily.    methocarbamol (ROBAXIN) 500 MG tablet Take 500 mg by mouth daily.    metoprolol tartrate (LOPRESSOR) 25 MG tablet Take 1 tablet (25 mg total) by mouth 2 (two) times daily.    omeprazole (PRILOSEC) 20 MG capsule Take 1 capsule (20 mg total) by mouth daily before breakfast.    ondansetron (ZOFRAN) 8  MG tablet Take 8 mg by mouth as needed for nausea.    OXYGEN Inhale 2 L into the lungs as needed (To keep O2 ststa > 90 %).    polyethylene glycol (MIRALAX / GLYCOLAX) 17 g packet Take 17 g by mouth  daily. On Tuesday, Thursday, and Saturday    potassium chloride SA (KLOR-CON) 20 MEQ tablet Take 40 mEq by mouth 3 (three) times daily. 03/09/2021: Long term regimen. No duration specified.   QUEtiapine (SEROQUEL) 25 MG tablet Take 12.5 mg by mouth at bedtime.    rOPINIRole (REQUIP) 0.25 MG tablet Take 0.25 mg by mouth at bedtime.    senna-docusate (SENOKOT-S) 8.6-50 MG tablet Take 1 tablet by mouth at bedtime as needed for mild constipation.    simethicone (MYLICON) 086 MG chewable tablet Chew 125 mg by mouth 3 (three) times daily as needed ("increased gas").    spironolactone (ALDACTONE) 25 MG tablet Take 25 mg by mouth daily.    UNABLE TO FIND Diet:Regular    venlafaxine XR (EFFEXOR-XR) 150 MG 24 hr capsule Take 150 mg by mouth daily with breakfast.    Vibegron (GEMTESA) 75 MG TABS Take 1 tablet by mouth daily.    [DISCONTINUED] ferrous sulfate 325 (65 FE) MG tablet Take 325 mg by mouth daily with breakfast. For Anemia and  Iron Deficiency    No facility-administered encounter medications on file as of 06/22/2021.     SIGNIFICANT DIAGNOSTIC EXAMS  PREVIOUS   06-12-20: HIDA:  1. No scintigraphic evidence of acute cholecystitis. 2. Findings compatible with severe biliary dyskinesia with little to no gallbladder emptying and elicitation of abdominal pain with the CCK administration.  06-13-20: chest x-ray:  Diffuse interstitial opacity with Awanda Mink lines with possible trace pleural fluid. Stable heart size and aortic contours. Negative for air leak  NO NEW EXAMS.    LABS REVIEWED PREVIOUS    06-10-20: wbc 12.6; hgb 10.5; hct 34.2; mcv 83.4 plt 417; glucose 126; bun 39; creat 4.4; na++ 137; ca 9.5 GFR 58; liver normal albumin 2.2 06-12-20: wbc 8.2; hgb 10.4; hct 33.6; mcv 81.2 plt 364; glucose 123;  bun 24; creat 0.87; k+ 3.5; na++ 138; GFR >60; liver normal albumin 1.9 06-13-20: urine culture: <10,000 colonies 06-14-20: wbc 9.0; hgb 10.5; hct 33.8; mcv 80.5 plt 267; glucose 122; bun 20; creat 0.97; k+3.3; na++ 140; ca 9.0 GFR >60 06-29-20: wbc 10.1; hgb 10.7; hct 36.1; mcv 83.0 plt 294; glucose 152; bun 26; creat 1.02; k+ 4.4; na++ 138 ca 8.8 GFR>60 07-24-20 PSA 16.0 08-14-20: glucose 110; bun 33; creat 1.00 ;k+ 2.5 na++ 140 08-15-20: glucose 114; bun 31; creat  0.84; k+ 3.1; na++ 142 08-21-20: k+ 3.5 10-04-20: albumin 3.2  10-19-20: PSA 12.25 (13.05)  12-21-20: wbc 4.6; hgb 13.4 hct 40.9 mcv 86.1 plt 223; glucose 104; bun 22; creat 0.89; k+ 3.0; na++ 143; ca 9.3 GFR>60; liver normal albumin 3.3; chol 128; ldl 71; trig 85 hdl 40   12-25-20: k+ 3.2 01-01-21: k+ 3.2 01-08-21: k+ 4.5 01-19-21: glucose 108; bun 20; creat 0.96; k+ 3.6; na++ 139; ca 9.0 GFR>60 02-05-21: glucose 109; bun 25; creat 1.05; k+ 4.5; na++ 141; ca 9.5; GFR>60.  03-13-21: wbc 7.3; hgb 14.0; hct 42.5; mcv 85.5 plt 212; glucose 126; bun 24; creat 1.03 ;k+ 3.9; na++ 139; ca 9.6; GFR>60 03-15-21: hgb a1c 5.7 03-20-21: wbc 20.3; hgb 14.6; hct 44.4; mcv 86.7 plt 229; glcusoe 201; bun 26; creat 1.44; k+ 4.2; na++ 138; ca 9.2; GFR 46  03-22-21: aldosterone 7.6; cortisol AM 19.5;  03-26-21: wbc 6.3; hgb 12.4; hct 37.9; mcv 87.5 plt 230; glucose 117; bun 28; creat 1.13; k+ 4.4; na++ 138; ca 9.2; GFR>60   NO NEW LABS.   Review of Systems  Constitutional:  Negative for malaise/fatigue.  Eyes:        Left lower eye lid; pain itching; red swollen  Respiratory:  Negative for cough and shortness of breath.   Cardiovascular:  Negative for chest pain, palpitations and leg swelling.  Gastrointestinal:  Negative for abdominal pain, constipation and heartburn.  Musculoskeletal:  Negative for back pain, joint pain and myalgias.  Skin: Negative.   Neurological:  Negative for dizziness.  Psychiatric/Behavioral:  The patient is not nervous/anxious.      Physical Exam Constitutional:      General: He is not in acute distress.    Appearance: He is well-developed. He is not diaphoretic.  Eyes:     Comments: Left lower lid is red swollen inflamed   Neck:     Thyroid: No thyromegaly.  Cardiovascular:     Rate and Rhythm: Normal rate and regular rhythm.     Pulses: Normal pulses.     Heart sounds: Normal heart sounds.  Pulmonary:     Effort: Pulmonary effort is normal. No respiratory distress.     Breath sounds: Normal breath sounds.  Abdominal:     General: Bowel sounds are normal. There is no distension.     Palpations: Abdomen is soft.     Tenderness: There is no abdominal tenderness.  Musculoskeletal:        General: Normal range of motion.     Cervical back: Neck supple.     Right lower leg: No edema.     Left lower leg: No edema.  Lymphadenopathy:     Cervical: No cervical adenopathy.  Skin:    General: Skin is warm and dry.  Neurological:     Mental Status: He is alert. Mental status is at baseline.  Psychiatric:        Mood and Affect: Mood normal.     ASSESSMENT/ PLAN:  TODAY  Blepharitis on left: will begin EES oint twice daily for one week.    Ok Edwards NP [facility]   call [phone]

## 2021-06-26 ENCOUNTER — Encounter: Payer: Self-pay | Admitting: Adult Health

## 2021-06-26 DIAGNOSIS — F321 Major depressive disorder, single episode, moderate: Secondary | ICD-10-CM | POA: Diagnosis not present

## 2021-06-26 DIAGNOSIS — F319 Bipolar disorder, unspecified: Secondary | ICD-10-CM | POA: Diagnosis not present

## 2021-06-26 DIAGNOSIS — F411 Generalized anxiety disorder: Secondary | ICD-10-CM | POA: Diagnosis not present

## 2021-06-26 DIAGNOSIS — G3184 Mild cognitive impairment, so stated: Secondary | ICD-10-CM | POA: Diagnosis not present

## 2021-06-27 DIAGNOSIS — F331 Major depressive disorder, recurrent, moderate: Secondary | ICD-10-CM | POA: Diagnosis not present

## 2021-06-28 DIAGNOSIS — K529 Noninfective gastroenteritis and colitis, unspecified: Secondary | ICD-10-CM | POA: Diagnosis not present

## 2021-06-28 DIAGNOSIS — Z1159 Encounter for screening for other viral diseases: Secondary | ICD-10-CM | POA: Diagnosis not present

## 2021-07-03 ENCOUNTER — Encounter: Payer: Self-pay | Admitting: Adult Health

## 2021-07-03 NOTE — Progress Notes (Signed)
Location:  [facility] Room Number: 156-D Place of Service:  SNF (31)   CODE STATUS: DNR  Allergies  Allergen Reactions   Hctz [Hydrochlorothiazide]     hypokalemia    Chief Complaint  Patient presents with   Acute Visit    Hallucinations     HPI:    Past Medical History:  Diagnosis Date   Anxiety    Cancer ([facility] [facility])    prostate   Chronic chest wall pain    Essential hypertension, benign 06/20/2016   GAD (generalized anxiety disorder)    GERD (gastroesophageal reflux disease)    H/O echocardiogram 2016   normal   Heart murmur    "leaking heart valve"   High cholesterol 06/20/2016   HOH (hard of hearing)    left   Hx of falling    Interstitial pulmonary disease (HCC)    Lower GI bleed    MDD (major depressive disorder)    Murmur, cardiac    Poor historian    PUD (peptic ulcer disease)    RLS (restless legs syndrome)    Shortness of breath    Wrist fracture 12/16/2011    Past Surgical History:  Procedure Laterality Date   CHOLECYSTECTOMY N/A 06/28/2020   Procedure: LAPAROSCOPIC CHOLECYSTECTOMY;  Surgeon: [name], MD;  Location: [facility] ORS;  Service: General;  Laterality: N/A;  pt in [place]  2005   Rehman: Sigmoid colon diverticulosis, moderate sized external hemorrhoids. A few focal areas of mucosal erythema felt to be nonspecific.   ESOPHAGOGASTRODUODENOSCOPY     ESOPHAGOGASTRODUODENOSCOPY N/A 07/18/2016   Rehman: normal exam except duodenal scar. h.pylori serologies negative   FLEXIBLE SIGMOIDOSCOPY N/A 06/11/2020   Procedure: FLEXIBLE SIGMOIDOSCOPY;  Surgeon: Eloise Harman, DO;  Location: [facility] ENDO SUITE;  Service: Endoscopy;  Laterality: N/A;   HARDWARE REMOVAL Right 03/20/2021   Procedure: HARDWARE REMOVAL right hip;  Surgeon: Carole Civil, MD;  Location: [facility] ORS;  Service: Orthopedics;  Laterality: Right;   HIP PINNING,CANNULATED Right 03/10/2020   Procedure: INTERNAL FIXATION RIGHT HIP;  Surgeon:  Carole Civil, MD;  Location: [facility] ORS;  Service: Orthopedics;  Laterality: Right;   ORIF WRIST FRACTURE  12/19/2011   Procedure: OPEN REDUCTION INTERNAL FIXATION (ORIF) WRIST FRACTURE;  Surgeon: Carole Civil, MD;  Location: [facility] ORS;  Service: Orthopedics;  Laterality: Right;   prostate cancer     Diagnosed in the 90s   TONSILLECTOMY      Social History   Socioeconomic History   Marital status: Widowed    Spouse name: Not on file   Number of children: Not on file   Years of education: Not on file   Highest education level: Not on file  Occupational History   Occupation: retired    Fish farm manager: RETIRED  Tobacco Use   Smoking status: Never   Smokeless tobacco: Never  Vaping Use   Vaping Use: Never used  Substance and Sexual Activity   Alcohol use: No   Drug use: No   Sexual activity: Not Currently  Other Topics Concern   Not on file  Social History Narrative   Is long term resident of [facility]    Social Determinants of Health   Financial Resource Strain: Not on file  Food Insecurity: Not on file  Transportation Needs: Not on file  Physical Activity: Not on file  Stress: Not on file  Social Connections: Not on file  Intimate Partner Violence: Not on file   Family  History  Problem Relation Age of Onset   Cancer Other    Heart attack Mother    Cancer Brother    Heart attack Brother    Heart disease Sister        by pass      VITAL SIGNS BP (!) 146/67    Pulse (!) 57    Temp 97.9 F (36.6 C)    Ht 5\' 9"  (1.753 m)    Wt 174 lb 9.6 oz (79.2 kg)    BMI 25.78 kg/m   Outpatient Encounter Medications as of 07/03/2021  Medication Sig   acetaminophen (TYLENOL) 500 MG tablet Take 500 mg by mouth every 6 (six) hours. Max 3000mg  in 24 hour period   alfuzosin (UROXATRAL) 10 MG 24 hr tablet Take 10 mg by mouth daily. 6 pm   Ascorbic Acid (VITAMIN C) 1000 MG tablet Take 1,000 mg by mouth daily.   Balsam [place]-Castor Oil (VENELEX) OINT Apply 1 application topically every 8  (eight) hours as needed (Apply to bilateral buttocks & sacral area qshift for blanchable erythema.).   calcium carbonate (TUMS - DOSED IN MG ELEMENTAL CALCIUM) 500 MG chewable tablet Chew 1 tablet by mouth 2 (two) times daily as needed for indigestion or heartburn.    diltiazem (CARDIZEM CD) 360 MG 24 hr capsule Take 1 capsule (360 mg total) by mouth daily.   Flax Oil-Fish Oil-Borage Oil (FISH OIL-FLAX OIL-BORAGE OIL) CAPS Take 1 capsule by mouth daily.   hydrochlorothiazide (MICROZIDE) 12.5 MG capsule Take 12.5 mg by mouth daily.   ipratropium-albuterol (DUONEB) 0.5-2.5 (3) MG/3ML SOLN Take 3 mLs by nebulization every 6 (six) hours as needed (shortness of breath or wheezing).   isosorbide mononitrate (IMDUR) 30 MG 24 hr tablet Take 1 tablet (30 mg total) by mouth daily.   lisinopril (ZESTRIL) 40 MG tablet Take 1 tablet (40 mg total) by mouth daily.   loperamide (IMODIUM A-D) 2 MG tablet Take 2 mg by mouth as needed (after each stool. (do not exceed more than 4 tabs in 24 hours)).   loratadine (CLARITIN) 10 MG tablet Take 10 mg by mouth daily.   Melatonin 10 MG TABS Take 10 mg by mouth at bedtime.   meloxicam (MOBIC) 7.5 MG tablet Take 7.5 mg by mouth daily. give for left hip pain   memantine (NAMENDA) 10 MG tablet Take 10 mg by mouth 2 (two) times daily.   methocarbamol (ROBAXIN) 500 MG tablet Take 500 mg by mouth daily.   metoprolol tartrate (LOPRESSOR) 25 MG tablet Take 1 tablet (25 mg total) by mouth 2 (two) times daily.   omeprazole (PRILOSEC) 20 MG capsule Take 1 capsule (20 mg total) by mouth daily before breakfast.   ondansetron (ZOFRAN) 8 MG tablet Take 8 mg by mouth as needed for nausea.   OXYGEN Inhale 2 L into the lungs as needed (To keep O2 ststa > 90 %).   polyethylene glycol (MIRALAX / GLYCOLAX) 17 g packet Take 17 g by mouth daily. On Tuesday, Thursday, and Saturday   potassium chloride SA (KLOR-CON) 20 MEQ tablet Take 40 mEq by mouth 3 (three) times daily.   QUEtiapine (SEROQUEL)  25 MG tablet Take 12.5 mg by mouth at bedtime.   rOPINIRole (REQUIP) 0.25 MG tablet Take 0.25 mg by mouth at bedtime.   senna-docusate (SENOKOT-S) 8.6-50 MG tablet Take 1 tablet by mouth at bedtime as needed for mild constipation.   simethicone (MYLICON) 903 MG chewable tablet Chew 125 mg by mouth 3 (three) times  daily as needed ("increased gas").   spironolactone (ALDACTONE) 25 MG tablet Take 25 mg by mouth daily.   UNABLE TO FIND Diet:Regular   venlafaxine XR (EFFEXOR-XR) 150 MG 24 hr capsule Take 150 mg by mouth daily with breakfast.   Vibegron (GEMTESA) 75 MG TABS Take 1 tablet by mouth daily.   [DISCONTINUED] ERYTHROMYCIN OP ointment; 5 mg/gram (0.5 %); amt: 1 application; ophthalmic (eye) Special Instructions: apply to left lower eyelid twice daily Twice A Day   No facility-administered encounter medications on file as of 07/03/2021.     SIGNIFICANT DIAGNOSTIC EXAMS       ASSESSMENT/ PLAN:     Ok Edwards NP [facility]  Contact [phone] Monday through Friday 8am- 5pm  After hours call [phone]

## 2021-07-04 DIAGNOSIS — F331 Major depressive disorder, recurrent, moderate: Secondary | ICD-10-CM | POA: Diagnosis not present

## 2021-07-05 ENCOUNTER — Non-Acute Institutional Stay (SKILLED_NURSING_FACILITY): Payer: Medicare Other | Admitting: Adult Health

## 2021-07-05 ENCOUNTER — Encounter: Payer: Self-pay | Admitting: Adult Health

## 2021-07-05 DIAGNOSIS — I129 Hypertensive chronic kidney disease with stage 1 through stage 4 chronic kidney disease, or unspecified chronic kidney disease: Secondary | ICD-10-CM | POA: Diagnosis not present

## 2021-07-05 DIAGNOSIS — I700 Atherosclerosis of aorta: Secondary | ICD-10-CM | POA: Diagnosis not present

## 2021-07-05 DIAGNOSIS — F339 Major depressive disorder, recurrent, unspecified: Secondary | ICD-10-CM | POA: Diagnosis not present

## 2021-07-05 DIAGNOSIS — J849 Interstitial pulmonary disease, unspecified: Secondary | ICD-10-CM

## 2021-07-05 DIAGNOSIS — N183 Chronic kidney disease, stage 3 unspecified: Secondary | ICD-10-CM | POA: Diagnosis not present

## 2021-07-05 DIAGNOSIS — K529 Noninfective gastroenteritis and colitis, unspecified: Secondary | ICD-10-CM | POA: Diagnosis not present

## 2021-07-05 DIAGNOSIS — Z1159 Encounter for screening for other viral diseases: Secondary | ICD-10-CM | POA: Diagnosis not present

## 2021-07-05 NOTE — Progress Notes (Signed)
Location:  [place] Room Number: 156 Place of Service:  SNF (31) Provider:  Ok Edwards, NP   CODE STATUS: DNR  Allergies  Allergen Reactions   Hctz [Hydrochlorothiazide]     hypokalemia    Chief Complaint  Patient presents with   Acute Visit    Care plan meeting    HPI:  We have come together for her care plan meeting. BIMS 15/15 mood 6/30: anxious depression at time; hearing voices in head. He required supervision to independence with his adl care. He is continent of bladder and bowel. He has had no falls. Dietary: weight is 178.2 pounds regular diet feeds self appetite Is good. Therapy: none at this time. He continues to be followed for his chronic illnesses including: Aortic atherosclerosis   Benign hypertension with chronic kidney disease stage 3  Interstitial lung disease Major depression recurrent chronic     Past Medical History:  Diagnosis Date   Anxiety    Cancer [facility])    prostate   Chronic chest wall pain    Essential hypertension, benign 06/20/2016   GAD (generalized anxiety disorder)    GERD (gastroesophageal reflux disease)    H/O echocardiogram 2016   normal   Heart murmur    "leaking heart valve"   High cholesterol 06/20/2016   HOH (hard of hearing)    left   Hx of falling    Interstitial pulmonary disease (HCC)    Lower GI bleed    MDD (major depressive disorder)    Murmur, cardiac    Poor historian    PUD (peptic ulcer disease)    RLS (restless legs syndrome)    Shortness of breath    Wrist fracture 12/16/2011    Past Surgical History:  Procedure Laterality Date   CHOLECYSTECTOMY N/A 06/28/2020   Procedure: LAPAROSCOPIC CHOLECYSTECTOMY;  Surgeon: [name], MD;  Location: [facility] ORS;  Service: General;  Laterality: N/A;  pt in [place]  2005   Rehman: Sigmoid colon diverticulosis, moderate sized external hemorrhoids. A few focal areas of mucosal erythema felt to be nonspecific.    ESOPHAGOGASTRODUODENOSCOPY     ESOPHAGOGASTRODUODENOSCOPY N/A 07/18/2016   Rehman: normal exam except duodenal scar. h.pylori serologies negative   FLEXIBLE SIGMOIDOSCOPY N/A 06/11/2020   Procedure: FLEXIBLE SIGMOIDOSCOPY;  Surgeon: Eloise Harman, DO;  Location: [facility] ENDO SUITE;  Service: Endoscopy;  Laterality: N/A;   HARDWARE REMOVAL Right 03/20/2021   Procedure: HARDWARE REMOVAL right hip;  Surgeon: Carole Civil, MD;  Location: [facility] ORS;  Service: Orthopedics;  Laterality: Right;   HIP PINNING,CANNULATED Right 03/10/2020   Procedure: INTERNAL FIXATION RIGHT HIP;  Surgeon: Carole Civil, MD;  Location: [facility] ORS;  Service: Orthopedics;  Laterality: Right;   ORIF WRIST FRACTURE  12/19/2011   Procedure: OPEN REDUCTION INTERNAL FIXATION (ORIF) WRIST FRACTURE;  Surgeon: Carole Civil, MD;  Location: [facility] OR[facility];  Service: Orthopedics;  Laterality: Right;   prostate cancer     Diagnosed in the 90s   TONSILLECTOMY      Social History   Socioeconomic History   Marital status: Widowed    Spouse name: Not on file   Number of children: Not on file   Years of education: Not on file   Highest education level: Not on file  Occupational History   Occupation: retired    Fish farm manager: RETIRED  Tobacco Use   Smoking status: Never   Smokeless tobacco: Never  Vaping Use   Vaping Use: Never  used  Substance and Sexual Activity   Alcohol use: No   Drug use: No   Sexual activity: Not Currently  Other Topics Concern   Not on file  Social History Narrative   Is long term resident of [facility]    Social Determinants of Health   Financial Resource Strain: Not on file  Food Insecurity: Not on file  Transportation Needs: Not on file  Physical Activity: Not on file  Stress: Not on file  Social Connections: Not on file  Intimate Partner Violence: Not on file   Family History  Problem Relation Age of Onset   Cancer Other    Heart attack Mother    Cancer Brother    Heart attack Brother    Heart  disease Sister        by pass      VITAL SIGNS BP (!) 146/67    Pulse (!) 57    Temp 98.1 F (36.7 C)    Resp 16    Ht 5\' 9"  (1.753 m)    Wt 174 lb 9.6 oz (79.2 kg)    SpO2 98%    BMI 25.78 kg/m   Outpatient Encounter Medications as of 07/05/2021  Medication Sig   acetaminophen (TYLENOL) 500 MG tablet Take 500 mg by mouth every 6 (six) hours. Max 3000mg  in 24 hour period   alfuzosin (UROXATRAL) 10 MG 24 hr tablet Take 10 mg by mouth daily. 6 pm   Ascorbic Acid (VITAMIN C) 1000 MG tablet Take 1,000 mg by mouth daily.   Balsam [place]-Castor Oil (VENELEX) OINT Apply 1 application topically every 8 (eight) hours as needed (Apply to bilateral buttocks & sacral area qshift for blanchable erythema.).   calcium carbonate (TUMS - DOSED IN MG ELEMENTAL CALCIUM) 500 MG chewable tablet Chew 1 tablet by mouth 2 (two) times daily as needed for indigestion or heartburn.    diltiazem (CARDIZEM CD) 360 MG 24 hr capsule Take 1 capsule (360 mg total) by mouth daily.   Flax Oil-Fish Oil-Borage Oil (FISH OIL-FLAX OIL-BORAGE OIL) CAPS Take 1 capsule by mouth daily.   hydrochlorothiazide (MICROZIDE) 12.5 MG capsule Take 12.5 mg by mouth daily.   ipratropium-albuterol (DUONEB) 0.5-2.5 (3) MG/3ML SOLN Take 3 mLs by nebulization every 6 (six) hours as needed (shortness of breath or wheezing).   isosorbide mononitrate (IMDUR) 30 MG 24 hr tablet Take 1 tablet (30 mg total) by mouth daily.   lisinopril (ZESTRIL) 40 MG tablet Take 1 tablet (40 mg total) by mouth daily.   loperamide (IMODIUM A-D) 2 MG tablet Take 2 mg by mouth as needed (after each stool. (do not exceed more than 4 tabs in 24 hours)).   loratadine (CLARITIN) 10 MG tablet Take 10 mg by mouth daily.   Melatonin 10 MG TABS Take 10 mg by mouth at bedtime.   meloxicam (MOBIC) 7.5 MG tablet Take 7.5 mg by mouth daily. give for left hip pain   memantine (NAMENDA) 10 MG tablet Take 10 mg by mouth 2 (two) times daily.   methocarbamol (ROBAXIN) 500 MG tablet Take  500 mg by mouth daily.   metoprolol tartrate (LOPRESSOR) 25 MG tablet Take 1 tablet (25 mg total) by mouth 2 (two) times daily.   omeprazole (PRILOSEC) 20 MG capsule Take 1 capsule (20 mg total) by mouth daily before breakfast.   ondansetron (ZOFRAN) 8 MG tablet Take 8 mg by mouth as needed for nausea.   OXYGEN Inhale 2 L into the lungs as needed (To keep  O2 ststa > 90 %).   polyethylene glycol (MIRALAX / GLYCOLAX) 17 g packet Take 17 g by mouth daily. On Tuesday, Thursday, and Saturday   potassium chloride SA (KLOR-CON) 20 MEQ tablet Take 40 mEq by mouth 3 (three) times daily.   QUEtiapine (SEROQUEL) 25 MG tablet Take 12.5 mg by mouth at bedtime.   rOPINIRole (REQUIP) 0.25 MG tablet Take 0.25 mg by mouth at bedtime.   senna-docusate (SENOKOT-S) 8.6-50 MG tablet Take 1 tablet by mouth at bedtime as needed for mild constipation.   simethicone (MYLICON) 992 MG chewable tablet Chew 125 mg by mouth 3 (three) times daily as needed ("increased gas").   spironolactone (ALDACTONE) 25 MG tablet Take 25 mg by mouth daily.   UNABLE TO FIND Diet:Regular   venlafaxine XR (EFFEXOR-XR) 150 MG 24 hr capsule Take 150 mg by mouth daily with breakfast.   Vibegron (GEMTESA) 75 MG TABS Take 1 tablet by mouth daily.   No facility-administered encounter medications on file as of 07/05/2021.     SIGNIFICANT DIAGNOSTIC EXAMS  PREVIOUS   06-12-20: HIDA:  1. No scintigraphic evidence of acute cholecystitis. 2. Findings compatible with severe biliary dyskinesia with little to no gallbladder emptying and elicitation of abdominal pain with the CCK administration.  06-13-20: chest x-ray:  Diffuse interstitial opacity with Awanda Mink lines with possible trace pleural fluid. Stable heart size and aortic contours. Negative for air leak  NO NEW EXAMS.    LABS REVIEWED PREVIOUS    06-10-20: wbc 12.6; hgb 10.5; hct 34.2; mcv 83.4 plt 417; glucose 126; bun 39; creat 4.4; na++ 137; ca 9.5 GFR 58; liver normal albumin 2.2 06-12-20:  wbc 8.2; hgb 10.4; hct 33.6; mcv 81.2 plt 364; glucose 123; bun 24; creat 0.87; k+ 3.5; na++ 138; GFR >60; liver normal albumin 1.9 06-13-20: urine culture: <10,000 colonies 06-14-20: wbc 9.0; hgb 10.5; hct 33.8; mcv 80.5 plt 267; glucose 122; bun 20; creat 0.97; k+3.3; na++ 140; ca 9.0 GFR >60 06-29-20: wbc 10.1; hgb 10.7; hct 36.1; mcv 83.0 plt 294; glucose 152; bun 26; creat 1.02; k+ 4.4; na++ 138 ca 8.8 GFR>60 07-24-20 PSA 16.0 08-14-20: glucose 110; bun 33; creat 1.00 ;k+ 2.5 na++ 140 08-15-20: glucose 114; bun 31; creat  0.84; k+ 3.1; na++ 142 08-21-20: k+ 3.5 10-04-20: albumin 3.2  10-19-20: PSA 12.25 (13.05)  12-21-20: wbc 4.6; hgb 13.4 hct 40.9 mcv 86.1 plt 223; glucose 104; bun 22; creat 0.89; k+ 3.0; na++ 143; ca 9.3 GFR>60; liver normal albumin 3.3; chol 128; ldl 71; trig 85 hdl 40   12-25-20: k+ 3.2 01-01-21: k+ 3.2 01-08-21: k+ 4.5 01-19-21: glucose 108; bun 20; creat 0.96; k+ 3.6; na++ 139; ca 9.0 GFR>60 02-05-21: glucose 109; bun 25; creat 1.05; k+ 4.5; na++ 141; ca 9.5; GFR>60.  03-13-21: wbc 7.3; hgb 14.0; hct 42.5; mcv 85.5 plt 212; glucose 126; bun 24; creat 1.03 ;k+ 3.9; na++ 139; ca 9.6; GFR>60 03-15-21: hgb a1c 5.7 03-20-21: wbc 20.3; hgb 14.6; hct 44.4; mcv 86.7 plt 229; glcusoe 201; bun 26; creat 1.44; k+ 4.2; na++ 138; ca 9.2; GFR 46  03-22-21: aldosterone 7.6; cortisol AM 19.5;  03-26-21: wbc 6.3; hgb 12.4; hct 37.9; mcv 87.5 plt 230; glucose 117; bun 28; creat 1.13; k+ 4.4; na++ 138; ca 9.2; GFR>60   NO NEW LABS.   Review of Systems  Constitutional:  Negative for malaise/fatigue.  Respiratory:  Negative for cough and shortness of breath.   Cardiovascular:  Negative for chest pain, palpitations and leg swelling.  Gastrointestinal:  Negative for abdominal pain, constipation and heartburn.  Musculoskeletal:  Negative for back pain, joint pain and myalgias.  Skin: Negative.   Neurological:  Negative for dizziness.  Psychiatric/Behavioral:  The patient is not nervous/anxious.     Physical Exam Constitutional:      General: He is not in acute distress.    Appearance: He is well-developed. He is not diaphoretic.  Neck:     Thyroid: No thyromegaly.  Cardiovascular:     Rate and Rhythm: Normal rate and regular rhythm.     Pulses: Normal pulses.     Heart sounds: Normal heart sounds.  Pulmonary:     Effort: Pulmonary effort is normal. No respiratory distress.     Breath sounds: Normal breath sounds.  Abdominal:     General: Bowel sounds are normal. There is no distension.     Palpations: Abdomen is soft.     Tenderness: There is no abdominal tenderness.  Musculoskeletal:        General: Normal range of motion.     Cervical back: Neck supple.  Lymphadenopathy:     Cervical: No cervical adenopathy.  Skin:    General: Skin is warm and dry.  Neurological:     Mental Status: He is alert and oriented to person, place, and time.  Psychiatric:        Mood and Affect: Mood normal.      ASSESSMENT/ PLAN:  TODAY  Aortic atherosclerosis  Benign hypertension with chronic kidney disease stage 3 Interstitial lung disease Major depression recurrent chronic   Will continue current medications Will continue current plan of care Will continue to monitor his status  Time spent with patient: 40 minutes: plan of care, medications.    Ok Edwards NP [facility]  call [phone]

## 2021-07-05 NOTE — Progress Notes (Signed)
This encounter was created in error - please disregard.

## 2021-07-10 ENCOUNTER — Encounter: Payer: Self-pay | Admitting: Adult Health

## 2021-07-10 DIAGNOSIS — F419 Anxiety disorder, unspecified: Secondary | ICD-10-CM | POA: Diagnosis not present

## 2021-07-10 DIAGNOSIS — F331 Major depressive disorder, recurrent, moderate: Secondary | ICD-10-CM | POA: Diagnosis not present

## 2021-07-10 NOTE — Progress Notes (Signed)
Location:  [place] Room Number: 156 Place of Service:  SNF (31)   CODE STATUS: dnr  Allergies  Allergen Reactions   Hctz [Hydrochlorothiazide]     hypokalemia    Chief Complaint  Patient presents with   Acute Visit    Left great toe concerns    Error    HPI:    Past Medical History:  Diagnosis Date   Anxiety    Cancer ([place])    prostate   Chronic chest wall pain    Essential hypertension, benign 06/20/2016   GAD (generalized anxiety disorder)    GERD (gastroesophageal reflux disease)    H/O echocardiogram 2016   normal   Heart murmur    "leaking heart valve"   High cholesterol 06/20/2016   HOH (hard of hearing)    left   Hx of falling    Interstitial pulmonary disease (HCC)    Lower GI bleed    MDD (major depressive disorder)    Murmur, cardiac    Poor historian    PUD (peptic ulcer disease)    RLS (restless legs syndrome)    Shortness of breath    Wrist fracture 12/16/2011    Past Surgical History:  Procedure Laterality Date   CHOLECYSTECTOMY N/A 06/28/2020   Procedure: LAPAROSCOPIC CHOLECYSTECTOMY;  Surgeon: [name], MD;  Location: [facility] ORS;  Service: General;  Laterality: N/A;  pt in [place]  2005   Rehman: Sigmoid colon diverticulosis, moderate sized external hemorrhoids. A few focal areas of mucosal erythema felt to be nonspecific.   ESOPHAGOGASTRODUODENOSCOPY     ESOPHAGOGASTRODUODENOSCOPY N/A 07/18/2016   Rehman: normal exam except duodenal scar. h.pylori serologies negative   FLEXIBLE SIGMOIDOSCOPY N/A 06/11/2020   Procedure: FLEXIBLE SIGMOIDOSCOPY;  Surgeon: Eloise Harman, DO;  Location: [facility];  Service: Endoscopy;  Laterality: N/A;   HARDWARE REMOVAL Right 03/20/2021   Procedure: HARDWARE REMOVAL right hip;  Surgeon: Carole Civil, MD;  Location: [facility] OR[facility];  Service: Orthopedics;  Laterality: Right;   HIP PINNING,CANNULATED Right 03/10/2020    Procedure: INTERNAL FIXATION RIGHT HIP;  Surgeon: Carole Civil, MD;  Location: [facility] ORS;  Service: Orthopedics;  Laterality: Right;   ORIF WRIST FRACTURE  12/19/2011   Procedure: OPEN REDUCTION INTERNAL FIXATION (ORIF) WRIST FRACTURE;  Surgeon: Carole Civil, MD;  Location: [facility];  Service: Orthopedics;  Laterality: Right;   prostate cancer     Diagnosed in the 90s   TONSILLECTOMY      Social History   Socioeconomic History   Marital status: Widowed    Spouse name: Not on file   Number of children: Not on file   Years of education: Not on file   Highest education level: Not on file  Occupational History   Occupation: retired    Fish farm manager: RETIRED  Tobacco Use   Smoking status: Never   Smokeless tobacco: Never  Vaping Use   Vaping Use: Never used  Substance and Sexual Activity   Alcohol use: No   Drug use: No   Sexual activity: Not Currently  Other Topics Concern   Not on file  Social History Narrative   Is long term resident of [facility]    Social Determinants of Health   Financial Resource Strain: Not on file  Food Insecurity: Not on file  Transportation Needs: Not on file  Physical Activity: Not on file  Stress: Not on file  Social Connections: Not on file  Intimate Partner  Violence: Not on file   Family History  Problem Relation Age of Onset   Cancer Other    Heart attack Mother    Cancer Brother    Heart attack Brother    Heart disease Sister        by pass      VITAL SIGNS BP (!) 154/72    Pulse 60    Temp 98 F (36.7 C)    Resp 18    Ht 5\' 9"  (1.753 m)    Wt 174 lb 9.6 oz (79.2 kg)    BMI 25.78 kg/m   Outpatient Encounter Medications as of 07/10/2021  Medication Sig   acetaminophen (TYLENOL) 500 MG tablet Take 500 mg by mouth every 6 (six) hours. Max 3000mg  in 24 hour period   alfuzosin (UROXATRAL) 10 MG 24 hr tablet Take 10 mg by mouth daily. 6 pm   Ascorbic Acid (VITAMIN C) 1000 MG tablet Take 1,000 mg by mouth daily.    Balsam [place]-Castor Oil (VENELEX) OINT Apply 1 application topically every 8 (eight) hours as needed (Apply to bilateral buttocks & sacral area qshift for blanchable erythema.).   calcium carbonate (TUMS - DOSED IN MG ELEMENTAL CALCIUM) 500 MG chewable tablet Chew 1 tablet by mouth 2 (two) times daily as needed for indigestion or heartburn.    diltiazem (CARDIZEM CD) 360 MG 24 hr capsule Take 1 capsule (360 mg total) by mouth daily.   Flax Oil-Fish Oil-Borage Oil (FISH OIL-FLAX OIL-BORAGE OIL) CAPS Take 1 capsule by mouth daily.   hydrochlorothiazide (MICROZIDE) 12.5 MG capsule Take 12.5 mg by mouth daily.   ipratropium-albuterol (DUONEB) 0.5-2.5 (3) MG/3ML SOLN Take 3 mLs by nebulization every 6 (six) hours as needed (shortness of breath or wheezing).   isosorbide mononitrate (IMDUR) 30 MG 24 hr tablet Take 1 tablet (30 mg total) by mouth daily.   lisinopril (ZESTRIL) 40 MG tablet Take 1 tablet (40 mg total) by mouth daily.   loperamide (IMODIUM A-D) 2 MG tablet Take 2 mg by mouth as needed (after each stool. (do not exceed more than 4 tabs in 24 hours)).   loratadine (CLARITIN) 10 MG tablet Take 10 mg by mouth daily.   Melatonin 10 MG TABS Take 10 mg by mouth at bedtime.   meloxicam (MOBIC) 7.5 MG tablet Take 7.5 mg by mouth daily. give for left hip pain   memantine (NAMENDA) 10 MG tablet Take 10 mg by mouth 2 (two) times daily.   methocarbamol (ROBAXIN) 500 MG tablet Take 500 mg by mouth daily.   metoprolol tartrate (LOPRESSOR) 25 MG tablet Take 1 tablet (25 mg total) by mouth 2 (two) times daily.   omeprazole (PRILOSEC) 20 MG capsule Take 1 capsule (20 mg total) by mouth daily before breakfast.   ondansetron (ZOFRAN) 8 MG tablet Take 8 mg by mouth as needed for nausea.   OXYGEN Inhale 2 L into the lungs as needed (To keep O2 ststa > 90 %).   polyethylene glycol (MIRALAX / GLYCOLAX) 17 g packet Take 17 g by mouth daily. On Tuesday, Thursday, and Saturday   potassium  chloride SA (KLOR-CON) 20 MEQ tablet Take 40 mEq by mouth 3 (three) times daily.   QUEtiapine (SEROQUEL) 25 MG tablet Take 12.5 mg by mouth at bedtime.   rOPINIRole (REQUIP) 0.25 MG tablet Take 0.25 mg by mouth at bedtime.   senna-docusate (SENOKOT-S) 8.6-50 MG tablet Take 1 tablet by mouth at bedtime as needed for mild constipation.   simethicone (MYLICON) 242  MG chewable tablet Chew 125 mg by mouth 3 (three) times daily as needed ("increased gas").   spironolactone (ALDACTONE) 25 MG tablet Take 25 mg by mouth daily.   UNABLE TO FIND Diet:Regular   venlafaxine XR (EFFEXOR-XR) 150 MG 24 hr capsule Take 150 mg by mouth daily with breakfast.   Vibegron (GEMTESA) 75 MG TABS Take 1 tablet by mouth daily.   No facility-administered encounter medications on file as of 07/10/2021.     SIGNIFICANT DIAGNOSTIC EXAMS  PREVIOUS   06-12-20: HIDA:  1. No scintigraphic evidence of acute cholecystitis. 2. Findings compatible with severe biliary dyskinesia with little to no gallbladder emptying and elicitation of abdominal pain with the CCK administration.  06-13-20: chest x-ray:  Diffuse interstitial opacity with Awanda Mink lines with possible trace pleural fluid. Stable heart size and aortic contours. Negative for air leak  NO NEW EXAMS.    LABS REVIEWED PREVIOUS    06-10-20: wbc 12.6; hgb 10.5; hct 34.2; mcv 83.4 plt 417; glucose 126; bun 39; creat 4.4; na++ 137; ca 9.5 GFR 58; liver normal albumin 2.2 06-12-20: wbc 8.2; hgb 10.4; hct 33.6; mcv 81.2 plt 364; glucose 123; bun 24; creat 0.87; k+ 3.5; na++ 138; GFR >60; liver normal albumin 1.9 06-13-20: urine culture: <10,000 colonies 06-14-20: wbc 9.0; hgb 10.5; hct 33.8; mcv 80.5 plt 267; glucose 122; bun 20; creat 0.97; k+3.3; na++ 140; ca 9.0 GFR >60 06-29-20: wbc 10.1; hgb 10.7; hct 36.1; mcv 83.0 plt 294; glucose 152; bun 26; creat 1.02; k+ 4.4; na++ 138 ca 8.8 GFR>60 07-24-20 PSA 16.0 08-14-20: glucose 110; bun 33; creat 1.00 ;k+ 2.5 na++ 140 08-15-20:  glucose 114; bun 31; creat  0.84; k+ 3.1; na++ 142 08-21-20: k+ 3.5 10-04-20: albumin 3.2  10-19-20: PSA 12.25 (13.05)  12-21-20: wbc 4.6; hgb 13.4 hct 40.9 mcv 86.1 plt 223; glucose 104; bun 22; creat 0.89; k+ 3.0; na++ 143; ca 9.3 GFR>60; liver normal albumin 3.3; chol 128; ldl 71; trig 85 hdl 40   12-25-20: k+ 3.2 01-01-21: k+ 3.2 01-08-21: k+ 4.5 01-19-21: glucose 108; bun 20; creat 0.96; k+ 3.6; na++ 139; ca 9.0 GFR>60 02-05-21: glucose 109; bun 25; creat 1.05; k+ 4.5; na++ 141; ca 9.5; GFR>60.  03-13-21: wbc 7.3; hgb 14.0; hct 42.5; mcv 85.5 plt 212; glucose 126; bun 24; creat 1.03 ;k+ 3.9; na++ 139; ca 9.6; GFR>60 03-15-21: hgb a1c 5.7 03-20-21: wbc 20.3; hgb 14.6; hct 44.4; mcv 86.7 plt 229; glcusoe 201; bun 26; creat 1.44; k+ 4.2; na++ 138; ca 9.2; GFR 46  03-22-21: aldosterone 7.6; cortisol AM 19.5;  03-26-21: wbc 6.3; hgb 12.4; hct 37.9; mcv 87.5 plt 230; glucose 117; bun 28; creat 1.13; k+ 4.4; na++ 138; ca 9.2; GFR>60   NO NEW LABS.       ASSESSMENT/ PLAN:     Ok Edwards NP [facility]   call [phone]   This encounter was created in error - please disregard.

## 2021-07-12 DIAGNOSIS — K529 Noninfective gastroenteritis and colitis, unspecified: Secondary | ICD-10-CM | POA: Diagnosis not present

## 2021-07-12 DIAGNOSIS — Z1159 Encounter for screening for other viral diseases: Secondary | ICD-10-CM | POA: Diagnosis not present

## 2021-07-13 DIAGNOSIS — F331 Major depressive disorder, recurrent, moderate: Secondary | ICD-10-CM | POA: Diagnosis not present

## 2021-07-17 DIAGNOSIS — F331 Major depressive disorder, recurrent, moderate: Secondary | ICD-10-CM | POA: Diagnosis not present

## 2021-07-18 ENCOUNTER — Non-Acute Institutional Stay (SKILLED_NURSING_FACILITY): Payer: Medicare Other | Admitting: Internal Medicine

## 2021-07-18 ENCOUNTER — Encounter: Payer: Self-pay | Admitting: Internal Medicine

## 2021-07-18 DIAGNOSIS — I10 Essential (primary) hypertension: Secondary | ICD-10-CM | POA: Diagnosis not present

## 2021-07-18 DIAGNOSIS — N3281 Overactive bladder: Secondary | ICD-10-CM | POA: Diagnosis not present

## 2021-07-18 DIAGNOSIS — C61 Malignant neoplasm of prostate: Secondary | ICD-10-CM

## 2021-07-18 DIAGNOSIS — J849 Interstitial pulmonary disease, unspecified: Secondary | ICD-10-CM

## 2021-07-18 NOTE — Progress Notes (Signed)
NURSING HOME LOCATION:  [facility] ROOM NUMBER:  156 D  CODE STATUS:  DNR  PCP:  Durenda Age NP,PSC  This is a nursing facility follow up visit of chronic medical diagnoses & to document compliance with Regulation 483.30 (c) in The [place] Manual Phase 2 which mandates caregiver visit ( visits can alternate among physician, PA or NP as per statutes) within 10 days of 30 days / 60 days/ 90 days post admission to SNF date    Interim medical record and care since last SNF visit was updated with review of diagnostic studies and change in clinical status since last visit were documented.  HPI: he is a permanent resident of the facility with medical diagnoses of hypertension, GERD, interstitial lung disease, BPH with LUTS, and biochemical prostate cancer. Urologist Dr. Festus Aloe is to see him in March for follow-up of the prostate cancer & OAB. The PSA was diagnosed in 2002; PSA was 22.7.  Gleason 3+4 equal 7, 3+3 equal 6.  No definitive therapy at that time.  Subsequently on ADT until 2013 when he started ADT.  ADT discontinued in 2015 and PSA rose to 28.7 and 2021.  PSA did not respond to 62-month course of Lupron.  He was adamant about not restarting ADT and has had no further treatment.  PSA was 16 in February 2022.  Review of systems: He has intermittent behavioral issues.  He will decline to see his family for periods of time.  He also has had some visual hallucinations reported to the staff as "red dots on the walls".  He fixates on issues such as recently believing he has tuberculosis.  At admission to the SNF he had tuberculin skin test which was negative.  There is no current chest x-ray in Epic.  Last chest x-ray was 03/12/2020 which suggested chronic interstitial changes compared to 2019.  Previously patient and family had denied knowledge of this diagnosis. Today he describes nonproductive cough for 3-4 months.  He also describes sharp  upper chest pain which is not definitely exertional.  He also describes some abdominal discomfort without dyspepsia.  He has little numbness in the left lower extremity.  His major complaint is urinary frequency.  He seemed to indicate that he has not had therapeutic response with interventions to date.  Constitutional: No fever, significant weight change, fatigue  Eyes: No redness, discharge, pain, vision change ENT/mouth: No nasal congestion,  purulent discharge, earache, change in hearing, sore throat  Cardiovascular: No  palpitations, paroxysmal nocturnal dyspnea, claudication, edema  Respiratory: No hemoptysis, DOE, significant snoring, apnea   Gastrointestinal: No dysphagia, nausea /vomiting, rectal bleeding, melena, change in bowels Genitourinary: No dysuria, hematuria, pyuria Musculoskeletal: No joint stiffness, joint swelling, weakness, pain Dermatologic: No rash, pruritus, change in appearance of skin Neurologic: No dizziness, headache, syncope, seizures Psychiatric: No significant anxiety, depression, insomnia, anorexia Endocrine: No change in hair/skin/nails, excessive thirst, excessive hunger Hematologic/lymphatic: No significant bruising, lymphadenopathy, abnormal bleeding Allergy/immunology: No itchy/watery eyes, significant sneezing, urticaria, angioedema  Physical exam:  Pertinent or positive findings: Affect was clinically 1 of suspicion as a began to ask questions.  Alopecia totalis is present.  The maxilla is edentulous.  Breath sounds are decreased; there were no bibasilar rales present.  Heart sounds are distant.  Abdomen is protuberant.  Pedal pulses are decreased.  General appearance: Adequately nourished; no acute distress, increased work of breathing is present.   Lymphatic: No lymphadenopathy about the head, neck, axilla.  Eyes: No conjunctival inflammation or lid edema is present. There is no scleral icterus. Ears:  External ear exam shows no significant lesions or  deformities.   Nose:  External nasal examination shows no deformity or inflammation. Nasal mucosa are pink and moist without lesions, exudates Oral exam:  Lips and gums are healthy appearing. There is no oropharyngeal erythema or exudate. Neck:  No thyromegaly, masses, tenderness noted.    Heart:  Normal rate and regular rhythm. S1 and S2 normal without gallop, murmur, click, rub .  Lungs: Chest clear to auscultation without wheezes, rhonchi, rales, rubs. Abdomen: Bowel sounds are normal. Abdomen is soft and nontender with no organomegaly, hernias, masses. GU: Deferred  Extremities:  No cyanosis, clubbing, edema  Neurologic exam : Cn 2-7 intact Strength equal  in upper & lower extremities Balance, Rhomberg, finger to nose testing could not be completed due to clinical state Deep tendon reflexes are equal Skin: Warm & dry w/o tenting. No significant lesions or rash.  See summary under each active problem in the Problem List with associated updated therapeutic plan

## 2021-07-19 ENCOUNTER — Encounter: Payer: Self-pay | Admitting: Internal Medicine

## 2021-07-19 DIAGNOSIS — Z1159 Encounter for screening for other viral diseases: Secondary | ICD-10-CM | POA: Diagnosis not present

## 2021-07-19 DIAGNOSIS — K529 Noninfective gastroenteritis and colitis, unspecified: Secondary | ICD-10-CM | POA: Diagnosis not present

## 2021-07-19 NOTE — Assessment & Plan Note (Signed)
Serial blood pressures reviewed; today's blood pressure is an outlier.  Continue to monitor and treat the average rather than extremes.

## 2021-07-19 NOTE — Assessment & Plan Note (Signed)
Urology follow-up in March.  It is anticipated that watchful surveillance will be recommended as PSA  had dropped and he has multiple advanced comorbidities in the context of advanced age.

## 2021-07-19 NOTE — Patient Instructions (Signed)
See assessment and plan under each diagnosis in the problem list and acutely for this visit 

## 2021-07-19 NOTE — Assessment & Plan Note (Signed)
Staff reports he is voicing concern that he may have tuberculosis.  Tuberculin skin test negative at the time of admission to the SNF.  Updated chest x-ray discussed with NP.

## 2021-07-19 NOTE — Assessment & Plan Note (Signed)
Frequency persist despite active therapy.Urology follow-up in March.

## 2021-07-26 DIAGNOSIS — K529 Noninfective gastroenteritis and colitis, unspecified: Secondary | ICD-10-CM | POA: Diagnosis not present

## 2021-07-26 DIAGNOSIS — Z1159 Encounter for screening for other viral diseases: Secondary | ICD-10-CM | POA: Diagnosis not present

## 2021-07-27 DIAGNOSIS — F331 Major depressive disorder, recurrent, moderate: Secondary | ICD-10-CM | POA: Diagnosis not present

## 2021-08-02 DIAGNOSIS — K529 Noninfective gastroenteritis and colitis, unspecified: Secondary | ICD-10-CM | POA: Diagnosis not present

## 2021-08-02 DIAGNOSIS — Z1159 Encounter for screening for other viral diseases: Secondary | ICD-10-CM | POA: Diagnosis not present

## 2021-08-03 DIAGNOSIS — F331 Major depressive disorder, recurrent, moderate: Secondary | ICD-10-CM | POA: Diagnosis not present

## 2021-08-07 DIAGNOSIS — F411 Generalized anxiety disorder: Secondary | ICD-10-CM | POA: Diagnosis not present

## 2021-08-07 DIAGNOSIS — G3184 Mild cognitive impairment, so stated: Secondary | ICD-10-CM | POA: Diagnosis not present

## 2021-08-07 DIAGNOSIS — F321 Major depressive disorder, single episode, moderate: Secondary | ICD-10-CM | POA: Diagnosis not present

## 2021-08-07 DIAGNOSIS — F319 Bipolar disorder, unspecified: Secondary | ICD-10-CM | POA: Diagnosis not present

## 2021-08-08 ENCOUNTER — Telehealth: Payer: Self-pay

## 2021-08-08 ENCOUNTER — Other Ambulatory Visit: Payer: Self-pay

## 2021-08-08 DIAGNOSIS — F331 Major depressive disorder, recurrent, moderate: Secondary | ICD-10-CM | POA: Diagnosis not present

## 2021-08-08 DIAGNOSIS — F419 Anxiety disorder, unspecified: Secondary | ICD-10-CM | POA: Diagnosis not present

## 2021-08-08 DIAGNOSIS — C61 Malignant neoplasm of prostate: Secondary | ICD-10-CM

## 2021-08-08 NOTE — Telephone Encounter (Signed)
Received call from [facility] to fax over PSA/testosterone lab orders to [phone] ? ?Orders faxed ?

## 2021-08-09 ENCOUNTER — Other Ambulatory Visit: Payer: Self-pay | Admitting: Adult Health

## 2021-08-09 ENCOUNTER — Other Ambulatory Visit (HOSPITAL_COMMUNITY)
Admission: RE | Admit: 2021-08-09 | Discharge: 2021-08-09 | Disposition: A | Discharge status: Home / Self Care | Payer: Medicare Other | Source: Skilled Nursing Facility | Attending: Internal Medicine | Admitting: Internal Medicine

## 2021-08-09 ENCOUNTER — Encounter: Payer: Self-pay | Admitting: Adult Health

## 2021-08-09 ENCOUNTER — Non-Acute Institutional Stay (SKILLED_NURSING_FACILITY): Payer: Medicare Other | Admitting: Adult Health

## 2021-08-09 DIAGNOSIS — K529 Noninfective gastroenteritis and colitis, unspecified: Secondary | ICD-10-CM | POA: Diagnosis not present

## 2021-08-09 DIAGNOSIS — Z0000 Encounter for general adult medical examination without abnormal findings: Secondary | ICD-10-CM

## 2021-08-09 DIAGNOSIS — N3281 Overactive bladder: Secondary | ICD-10-CM | POA: Insufficient documentation

## 2021-08-09 DIAGNOSIS — Z1159 Encounter for screening for other viral diseases: Secondary | ICD-10-CM | POA: Diagnosis not present

## 2021-08-09 MED ORDER — ALPRAZOLAM 0.25 MG PO TABS: 0.2500 mg | ORAL_TABLET | Freq: Two times a day (BID) | ORAL | 0 refills | Status: AC | PRN

## 2021-08-09 NOTE — Progress Notes (Signed)
?Location:  [place] ?Nursing Home Room Number: 156-D ?Place of Service:  SNF (31) ? ? ?CODE STATUS: DNR ? ?Allergies  ?Allergen Reactions  ? Hctz [Hydrochlorothiazide]   ?  hypokalemia  ? ? ?Chief Complaint  ?Patient presents with  ? Medicare Wellness  ?  Annual Wellness Exam  ? ? ?HPI: ? ? ? ?Past Medical History:  ?Diagnosis Date  ? Anxiety   ? Cancer [facility])   ? prostate  ? Chronic chest wall pain   ? Essential hypertension, benign 06/20/2016  ? GAD (generalized anxiety disorder)   ? GERD (gastroesophageal reflux disease)   ? H/O echocardiogram 2016  ? normal  ? Heart murmur   ? "leaking heart valve"  ? High cholesterol 06/20/2016  ? HOH (hard of hearing)   ? left  ? Hx of falling   ? Interstitial pulmonary disease ([place])   ? Lower GI bleed   ? MDD (major depressive disorder)   ? Murmur, cardiac   ? Poor historian   ? PUD (peptic ulcer disease)   ? RLS (restless legs syndrome)   ? Shortness of breath   ? Wrist fracture 12/16/2011  ? ? ?Past Surgical History:  ?Procedure Laterality Date  ? CHOLECYSTECTOMY N/A 06/28/2020  ? Procedure: LAPAROSCOPIC CHOLECYSTECTOMY;  Surgeon: [name], MD;  Location: [facility];  Service: General;  Laterality: N/A;  pt in [place]  ? COLONOSCOPY  2005  ? Rehman: Sigmoid colon diverticulosis, moderate sized external hemorrhoids. A few focal areas of mucosal erythema felt to be nonspecific.  ? ESOPHAGOGASTRODUODENOSCOPY    ? ESOPHAGOGASTRODUODENOSCOPY N/A 07/18/2016  ? Rehman: normal exam except duodenal scar. h.pylori serologies negative  ? FLEXIBLE SIGMOIDOSCOPY N/A 06/11/2020  ? Procedure: FLEXIBLE SIGMOIDOSCOPY;  Surgeon: Eloise Harman, DO;  Location: [facility];  Service: Endoscopy;  Laterality: N/A;  ? HARDWARE REMOVAL Right 03/20/2021  ? Procedure: HARDWARE REMOVAL right hip;  Surgeon: Carole Civil, MD;  Location: [facility];  Service: Orthopedics;  Laterality: Right;  ? HIP PINNING,CANNULATED Right 03/10/2020  ? Procedure: INTERNAL FIXATION RIGHT HIP;   Surgeon: Carole Civil, MD;  Location: [facility];  Service: Orthopedics;  Laterality: Right;  ? ORIF WRIST FRACTURE  12/19/2011  ? Procedure: OPEN REDUCTION INTERNAL FIXATION (ORIF) WRIST FRACTURE;  Surgeon: Carole Civil, MD;  Location: [facility];  Service: Orthopedics;  Laterality: Right;  ? prostate cancer    ? Diagnosed in the 90s  ? TONSILLECTOMY    ? ? ?Social History  ? ?Socioeconomic History  ? Marital status: Widowed  ?  Spouse name: Not on file  ? Number of children: Not on file  ? Years of education: Not on file  ? Highest education level: Not on file  ?Occupational History  ? Occupation: retired  ?  Employer: RETIRED  ?Tobacco Use  ? Smoking status: Never  ? Smokeless tobacco: Never  ?Vaping Use  ? Vaping Use: Never used  ?Substance and Sexual Activity  ? Alcohol use: No  ? Drug use: No  ? Sexual activity: Not Currently  ?Other Topics Concern  ? Not on file  ?Social History Narrative  ? Is long term resident of [facility]   ? ?Social Determinants of Health  ? ?Financial Resource Strain: Not on file  ?Food Insecurity: Not on file  ?Transportation Needs: Not on file  ?Physical Activity: Not on file  ?Stress: Not on file  ?Social Connections: Not on file  ?Intimate Partner Violence: Not on file  ? ?  Family History  ?Problem Relation Age of Onset  ? Cancer Other   ? Heart attack Mother   ? Cancer Brother   ? Heart attack Brother   ? Heart disease Sister   ?     by pass  ? ? ? ? ?VITAL SIGNS ?BP 138/81   Pulse 60   Temp (!) 97.1 ?F (36.2 ?C)   Ht 5\' 9"  (1.753 m)   Wt 177 lb 3.2 oz (80.4 kg)   BMI 26.17 kg/m?  ? ?Outpatient Encounter Medications as of 08/09/2021  ?Medication Sig  ? acetaminophen (TYLENOL) 500 MG tablet Take 500 mg by mouth every 6 (six) hours. Max 3000mg  in 24 hour period  ? alfuzosin (UROXATRAL) 10 MG 24 hr tablet Take 10 mg by mouth daily. 6 pm  ? Ascorbic Acid (VITAMIN C) 1000 MG tablet Take 1,000 mg by mouth daily.  ? Balsam Engineer, materials (VENELEX) OINT Apply 1 application topically every  8 (eight) hours as needed (Apply to bilateral buttocks & sacral area qshift for blanchable erythema.).  ? calcium carbonate (TUMS - DOSED IN MG ELEMENTAL CALCIUM) 500 MG chewable tablet Chew 1 tablet by mouth 2 (two) times daily as needed for indigestion or heartburn.   ? diltiazem (CARDIZEM CD) 360 MG 24 hr capsule Take 1 capsule (360 mg total) by mouth daily.  ? Flax Oil-Fish Oil-Borage Oil (FISH OIL-FLAX OIL-BORAGE OIL) CAPS Take 1 capsule by mouth daily.  ? hydrochlorothiazide (MICROZIDE) 12.5 MG capsule Take 12.5 mg by mouth daily.  ? ipratropium-albuterol (DUONEB) 0.5-2.5 (3) MG/3ML SOLN Take 3 mLs by nebulization every 6 (six) hours as needed (shortness of breath or wheezing).  ? isosorbide mononitrate (IMDUR) 30 MG 24 hr tablet Take 1 tablet (30 mg total) by mouth daily.  ? lisinopril (ZESTRIL) 40 MG tablet Take 1 tablet (40 mg total) by mouth daily.  ? loperamide (IMODIUM A-D) 2 MG tablet Take 2 mg by mouth as needed (after each stool. (do not exceed more than 4 tabs in 24 hours)).  ? loratadine (CLARITIN) 10 MG tablet Take 10 mg by mouth daily.  ? Melatonin 10 MG TABS Take 10 mg by mouth at bedtime.  ? meloxicam (MOBIC) 7.5 MG tablet Take 7.5 mg by mouth daily. give for left hip pain  ? memantine (NAMENDA) 10 MG tablet Take 10 mg by mouth 2 (two) times daily.  ? methocarbamol (ROBAXIN) 500 MG tablet Take 500 mg by mouth daily.  ? metoprolol tartrate (LOPRESSOR) 25 MG tablet Take 1 tablet (25 mg total) by mouth 2 (two) times daily.  ? omeprazole (PRILOSEC) 20 MG capsule Take 1 capsule (20 mg total) by mouth daily before breakfast.  ? ondansetron (ZOFRAN) 8 MG tablet Take 8 mg by mouth as needed for nausea.  ? OXYGEN Inhale 2 L into the lungs as needed (To keep O2 ststa > 90 %).  ? polyethylene glycol (MIRALAX / GLYCOLAX) 17 g packet Take 17 g by mouth daily. On Tuesday, Thursday, and Saturday  ? potassium chloride SA (KLOR-CON) 20 MEQ tablet Take 40 mEq by mouth 3 (three) times daily.  ? QUEtiapine  (SEROQUEL) 25 MG tablet Take 12.5 mg by mouth 2 (two) times daily.  ? rOPINIRole (REQUIP) 0.25 MG tablet Take 0.25 mg by mouth at bedtime.  ? senna-docusate (SENOKOT-S) 8.6-50 MG tablet Take 1 tablet by mouth at bedtime as needed for mild constipation.  ? simethicone (MYLICON) 875 MG chewable tablet Chew 125 mg by mouth 3 (three) times daily as needed ("  increased gas").  ? spironolactone (ALDACTONE) 25 MG tablet Take 25 mg by mouth daily.  ? UNABLE TO FIND Diet:Regular  ? venlafaxine XR (EFFEXOR-XR) 150 MG 24 hr capsule Take 150 mg by mouth daily with breakfast.  ? Vibegron (GEMTESA) 75 MG TABS Take 1 tablet by mouth daily.  ? ?No facility-administered encounter medications on file as of 08/09/2021.  ? ? ? ?SIGNIFICANT DIAGNOSTIC EXAMS ? ? ? ? ? ? ?ASSESSMENT/ PLAN: ? ? ? ? ?Ok Edwards NP ?[facility]  ?Contact [phone] Monday through Friday 8am- 5pm  ?After hours call [phone]  ? ?

## 2021-08-09 NOTE — Progress Notes (Signed)
Subjective:   Jeffrey Sherman is a 88 y.o. male who presents for Medicare Annual/Subsequent preventive examination.  Review of Systems    Review of Systems  Constitutional:  Negative for malaise/fatigue.  Respiratory:  Negative for cough and shortness of breath.   Cardiovascular:  Negative for chest pain, palpitations and leg swelling.  Gastrointestinal:  Negative for abdominal pain, constipation and heartburn.  Musculoskeletal:  Negative for back pain, joint pain and myalgias.  Skin: Negative.   Neurological:  Negative for dizziness.  Psychiatric/Behavioral:  The patient is not nervous/anxious.    Cardiac Risk Factors include: advanced age (>10men, >20 women);male gender;sedentary lifestyle     Objective:    Today's Vitals   08/09/21 0928 08/09/21 1427  BP: 138/81   Pulse: 60   Temp: (!) 97.1 F (36.2 C)   Weight: 177 lb 3.2 oz (80.4 kg)   Height: 5\' 9"  (1.753 m)   PainSc:  0-No pain   Body mass index is 26.17 kg/m.  Advanced Directives 08/09/2021 07/05/2021 06/22/2021 06/13/2021 05/14/2021 04/03/2021 03/21/2021  Does Patient Have a Medical Advance Directive? Yes Yes Yes Yes Yes Yes Yes  Type of Advance Directive Living will;Out of facility DNR (pink MOST or yellow form) Living will;Out of facility DNR (pink MOST or yellow form) Living will;Out of facility DNR (pink MOST or yellow form) Living will;Out of facility DNR (pink MOST or yellow form) Living will;Out of facility DNR (pink MOST or yellow form) Out of facility DNR (pink MOST or yellow form);Living will Out of facility DNR (pink MOST or yellow form);Living will  Does patient want to make changes to medical advance directive? No - Patient declined No - Patient declined No - Patient declined No - Patient declined No - Patient declined No - Patient declined No - Patient declined  Copy of [place] in [place]  Would patient like information on creating a medical advance directive? - - - - - - -   Pre-existing out of facility DNR order (yellow form or pink MOST form) Yellow form placed in chart (order not valid for inpatient use) Yellow form placed in chart (order not valid for inpatient use) Yellow form placed in chart (order not valid for inpatient use) Yellow form placed in chart (order not valid for inpatient use) Yellow form placed in chart (order not valid for inpatient use) Yellow form placed in chart (order not valid for inpatient use) -    Current Medications (verified) Outpatient Encounter Medications as of 08/09/2021  Medication Sig   acetaminophen (TYLENOL) 500 MG tablet Take 500 mg by mouth every 6 (six) hours. Max 3000mg  in 24 hour period   alfuzosin (UROXATRAL) 10 MG 24 hr tablet Take 10 mg by mouth daily. 6 pm   Ascorbic Acid (VITAMIN C) 1000 MG tablet Take 1,000 mg by mouth daily.   Balsam [place]-Castor Oil (VENELEX) OINT Apply 1 application topically every 8 (eight) hours as needed (Apply to bilateral buttocks & sacral area qshift for blanchable erythema.).   calcium carbonate (TUMS - DOSED IN MG ELEMENTAL CALCIUM) 500 MG chewable tablet Chew 1 tablet by mouth 2 (two) times daily as needed for indigestion or heartburn.    diltiazem (CARDIZEM CD) 360 MG 24 hr capsule Take 1 capsule (360 mg total) by mouth daily.   Flax Oil-Fish Oil-Borage Oil (FISH OIL-FLAX OIL-BORAGE OIL) CAPS Take 1 capsule by mouth daily.   hydrochlorothiazide (MICROZIDE) 12.5 MG capsule Take 12.5 mg by  mouth daily.   ipratropium-albuterol (DUONEB) 0.5-2.5 (3) MG/3ML SOLN Take 3 mLs by nebulization every 6 (six) hours as needed (shortness of breath or wheezing).   isosorbide mononitrate (IMDUR) 30 MG 24 hr tablet Take 1 tablet (30 mg total) by mouth daily.   lisinopril (ZESTRIL) 40 MG tablet Take 1 tablet (40 mg total) by mouth daily.   loperamide (IMODIUM A-D) 2 MG tablet Take 2 mg by mouth as needed (after each stool. (do not exceed more than 4 tabs in 24 hours)).   loratadine (CLARITIN) 10 MG tablet Take  10 mg by mouth daily.   Melatonin 10 MG TABS Take 10 mg by mouth at bedtime.   meloxicam (MOBIC) 7.5 MG tablet Take 7.5 mg by mouth daily. give for left hip pain   memantine (NAMENDA) 10 MG tablet Take 10 mg by mouth 2 (two) times daily.   methocarbamol (ROBAXIN) 500 MG tablet Take 500 mg by mouth daily.   metoprolol tartrate (LOPRESSOR) 25 MG tablet Take 1 tablet (25 mg total) by mouth 2 (two) times daily.   omeprazole (PRILOSEC) 20 MG capsule Take 1 capsule (20 mg total) by mouth daily before breakfast.   ondansetron (ZOFRAN) 8 MG tablet Take 8 mg by mouth as needed for nausea.   OXYGEN Inhale 2 L into the lungs as needed (To keep O2 ststa > 90 %).   polyethylene glycol (MIRALAX / GLYCOLAX) 17 g packet Take 17 g by mouth daily. On Tuesday, Thursday, and Saturday   potassium chloride SA (KLOR-CON) 20 MEQ tablet Take 40 mEq by mouth 3 (three) times daily.   QUEtiapine (SEROQUEL) 25 MG tablet Take 12.5 mg by mouth 2 (two) times daily.   rOPINIRole (REQUIP) 0.25 MG tablet Take 0.25 mg by mouth at bedtime.   senna-docusate (SENOKOT-S) 8.6-50 MG tablet Take 1 tablet by mouth at bedtime as needed for mild constipation.   simethicone (MYLICON) 099 MG chewable tablet Chew 125 mg by mouth 3 (three) times daily as needed ("increased gas").   spironolactone (ALDACTONE) 25 MG tablet Take 25 mg by mouth daily.   UNABLE TO FIND Diet:Regular   venlafaxine XR (EFFEXOR-XR) 150 MG 24 hr capsule Take 150 mg by mouth daily with breakfast.   Vibegron (GEMTESA) 75 MG TABS Take 1 tablet by mouth daily.   No facility-administered encounter medications on file as of 08/09/2021.    Allergies (verified) Hctz [hydrochlorothiazide]   History: Past Medical History:  Diagnosis Date   Anxiety    Cancer ([place])    prostate   Chronic chest wall pain    Essential hypertension, benign 06/20/2016   GAD (generalized anxiety disorder)    GERD (gastroesophageal reflux disease)    H/O echocardiogram 2016   normal   Heart  murmur    "leaking heart valve"   High cholesterol 06/20/2016   HOH (hard of hearing)    left   Hx of falling    Interstitial pulmonary disease (HCC)    Lower GI bleed    MDD (major depressive disorder)    Murmur, cardiac    Poor historian    PUD (peptic ulcer disease)    RLS (restless legs syndrome)    Shortness of breath    Wrist fracture 12/16/2011   Past Surgical History:  Procedure Laterality Date   CHOLECYSTECTOMY N/A 06/28/2020   Procedure: LAPAROSCOPIC CHOLECYSTECTOMY;  Surgeon: [name], MD;  Location: [facility];  Service: General;  Laterality: N/A;  pt in [place]  2005  Rehman: Sigmoid colon diverticulosis, moderate sized external hemorrhoids. A few focal areas of mucosal erythema felt to be nonspecific.   ESOPHAGOGASTRODUODENOSCOPY     ESOPHAGOGASTRODUODENOSCOPY N/A 07/18/2016   Rehman: normal exam except duodenal scar. h.pylori serologies negative   FLEXIBLE SIGMOIDOSCOPY N/A 06/11/2020   Procedure: FLEXIBLE SIGMOIDOSCOPY;  Surgeon: Eloise Harman, DO;  Location: [facility];  Service: Endoscopy;  Laterality: N/A;   HARDWARE REMOVAL Right 03/20/2021   Procedure: HARDWARE REMOVAL right hip;  Surgeon: Carole Civil, MD;  Location: [facility];  Service: Orthopedics;  Laterality: Right;   HIP PINNING,CANNULATED Right 03/10/2020   Procedure: INTERNAL FIXATION RIGHT HIP;  Surgeon: Carole Civil, MD;  Location: [facility];  Service: Orthopedics;  Laterality: Right;   ORIF WRIST FRACTURE  12/19/2011   Procedure: OPEN REDUCTION INTERNAL FIXATION (ORIF) WRIST FRACTURE;  Surgeon: Carole Civil, MD;  Location: [facility];  Service: Orthopedics;  Laterality: Right;   prostate cancer     Diagnosed in the 90s   TONSILLECTOMY     Family History  Problem Relation Age of Onset   Cancer Other    Heart attack Mother    Cancer Brother    Heart attack Brother    Heart disease Sister        by pass   Social History   Socioeconomic History   Marital  status: Widowed    Spouse name: Not on file   Number of children: Not on file   Years of education: Not on file   Highest education level: Not on file  Occupational History   Occupation: retired    Fish farm manager: RETIRED  Tobacco Use   Smoking status: Never   Smokeless tobacco: Never  Vaping Use   Vaping Use: Never used  Substance and Sexual Activity   Alcohol use: No   Drug use: No   Sexual activity: Not Currently  Other Topics Concern   Not on file  Social History Narrative   [facility]    Social Determinants of Health   Financial Resource Strain: Not on file  Food Insecurity: Not on file  Transportation Needs: Not on file  Physical Activity: Not on file  Stress: Not on file  Social Connections: Not on file    Tobacco Counseling Counseling given: Not Answered   Clinical Intake:  Pre-visit preparation completed: Yes  Pain : No/denies pain Pain Score: 0-No pain     BMI - recorded: 26.17 Nutritional Status: BMI 25 -29 Overweight Nutritional Risks: Unintentional weight loss Diabetes: No  How often do you need to have someone help you when you read instructions, pamphlets, or other written materials from your doctor or pharmacy?: 3 - Sometimes  Diabetic?no  Interpreter Needed?: No      Activities of Daily Living In your present state of health, do you have any difficulty performing the following activities: 08/09/2021 03/13/2021  Hearing? N Y  Comment - bilateral hearig aids  Vision? N N  Difficulty concentrating or making decisions? N Y  Walking or climbing stairs? Y Y  Dressing or bathing? Y N  Doing errands, shopping? Y N  Preparing Food and eating ? Y -  Using the Toilet? N -  In the past six months, have you accidently leaked urine? Y -  Do you have problems with loss of bowel control? N -  Managing your Medications? Y -  Managing your Finances? Y -  Housekeeping or managing your Housekeeping? Y -  Some recent data  might be hidden     Patient Care Team: Gerlene Fee, NP as PCP - General ([place], [place] ([place])  Indicate any recent [place] you may have received from other than [facility] providers in the past year (date may be approximate).     Assessment:   This is a routine wellness examination for [place].  Hearing/Vision screen No results found.  Dietary issues and exercise activities discussed: Current Exercise Habits: The patient does not participate in regular exercise at present, Exercise limited by: None identified   Goals Addressed             This Visit's Progress    DIET - INCREASE WATER INTAKE   On track    Follow up with Provider as scheduled   On track    General - Client will not be readmitted within 30 days ([facility])   On track      Depression Screen PHQ 2/9 Scores 08/09/2021 06/26/2021 08/08/2020  PHQ - 2 Score 1 0 0  PHQ- 9 Score 4 - -    Fall Risk Fall Risk  08/09/2021 07/10/2021 06/26/2021 08/08/2020 06/20/2020  Falls in the past year? 0 0 0 0 1  Comment - - - - -  Number falls in past yr: 0 0 0 0 1  Injury with Fall? 0 0 0 0 1  Risk for fall due to : Impaired balance/gait;Impaired mobility Impaired balance/gait;Impaired mobility - No Fall Risks History of fall(s);Impaired mobility  Follow up - - - - Falls evaluation completed;Falls prevention discussed    FALL RISK PREVENTION PERTAINING TO THE HOME:  Any stairs in or around the home? Yes  If so, are there any without handrails? No  Home free of loose throw rugs in walkways, pet beds, electrical cords, etc? No  Adequate lighting in your home to reduce risk of falls? Yes   ASSISTIVE DEVICES UTILIZED TO PREVENT FALLS:  Life alert? No  Use of a cane, walker or w/c? Yes  Grab bars in the bathroom? Yes  Shower chair or bench in shower? Yes  Elevated toilet seat or a handicapped toilet? Yes   TIMED UP AND GO:  Was the test performed? No .  Length of time to ambulate   Cognitive  Function: MMSE - Mini Mental State Exam 08/09/2021 08/08/2020  Orientation to time 5 5  Orientation to Place 5 5  Registration 3 3  Attention/ Calculation 4 4  Recall 3 3  Language- name 2 objects 2 2  Language- repeat 1 1  Language- follow 3 step command 3 3  Language- read & follow direction 1 1  Write a sentence 0 0  Copy design 0 1  Total score 27 28     6CIT Screen 08/09/2021 08/08/2020  What Year? 0 points 0 points  What month? 0 points 0 points  What time? 0 points 0 points  Count back from 20 2 points 2 points  Months in reverse 2 points 2 points  Repeat phrase 0 points 0 points  Total Score 4 4    Immunizations Immunization History  Administered Date(s) Administered   Fluad Quad(high Dose 65+) 03/14/2021   Influenza Split 03/10/2013   Influenza, High Dose Seasonal PF 03/31/2018   Influenza-Unspecified 03/17/2020   MODERNA COVID-19 SARS-COV-2 PEDS BIVALENT BOOSTER 6Y-11Y 03/28/2021   Moderna Covid-19 Vaccine Bivalent Booster 60yrs & up 04/03/2021, 04/10/2021   Moderna SARS-COV2 Booster Vaccination 04/13/2020, 09/20/2020   Moderna Sars-Covid-2 Vaccination 06/22/2019, 08/02/2019  PNEUMOCOCCAL CONJUGATE-20 07/21/2020   Pneumococcal Conjugate-13 03/16/2014   Pneumococcal Polysaccharide-23 04/19/1997   Td 11/14/2006   Tdap 12/02/2019   Zoster Recombinat (Shingrix) 05/01/2019, 06/30/2019    TDAP status: Up to date  Flu Vaccine status: Up to date  Pneumococcal vaccine status: Up to date  Covid-19 vaccine status: Completed vaccines  Qualifies for Shingles Vaccine? Yes   Zostavax completed Yes   Shingrix Completed?: Yes  Screening Tests Health Maintenance  Topic Date Due   COVID-19 Vaccine (3 - Moderna risk series) 04/10/2021   TETANUS/TDAP  12/01/2029   Pneumonia Vaccine 58+ Years old  Completed   INFLUENZA VACCINE  Completed   Zoster Vaccines- Shingrix  Completed   HPV VACCINES  Aged Out    Health Maintenance  Health Maintenance Due  Topic Date Due    COVID-19 Vaccine (3 - Moderna risk series) 04/10/2021    Colorectal cancer screening: No longer required.   Lung Cancer Screening: (Low Dose CT Chest recommended if Age 86-80 years, 30 pack-year currently smoking OR have quit w/in 15years.) does not qualify.   Lung Cancer Screening Referral: n/a  Additional Screening:  Hepatitis C Screening: does not qualify;   Vision Screening: Recommended annual ophthalmology exams for early detection of glaucoma and other disorders of the eye. Is the patient up to date with their annual eye exam?  Yes  Who is the provider or what is the name of the office in which the patient attends annual eye exams?  If pt is not established with a provider, would they like to be referred to a provider to establish care? No .   Dental Screening: Recommended annual dental exams for proper oral hygiene  Community Resource Referral / Chronic Care Management: CRR required this visit?  No   CCM required this visit?  No      Plan:     I have personally reviewed and noted the following in the patients chart:   Medical and social history Use of alcohol, tobacco or illicit drugs  Current medications and supplements including opioid prescriptions. Patient is not currently taking opioid prescriptions. Functional ability and status Nutritional status Physical activity Advanced directives List of other physicians Hospitalizations, surgeries, and ER visits in previous 12 months Vitals Screenings to include cognitive, depression, and falls Referrals and appointments  In addition, I have reviewed and discussed with patient certain preventive protocols, quality metrics, and best practice recommendations. A written personalized care plan for preventive services as well as general preventive health recommendations were provided to patient.     Gerlene Fee, NP   08/09/2021   Nurse Notes:

## 2021-08-09 NOTE — Patient Instructions (Signed)
?  [name] , ?Thank you for taking time to come for your Medicare Wellness Visit. I appreciate your ongoing commitment to your health goals. Please review the following plan we discussed and let me know if I can assist you in the future.  ? ?These are the goals we discussed: ? Goals   ? ?  DIET - INCREASE WATER INTAKE   ?  Follow up with Provider as scheduled   ?  General - Client will not be readmitted within 30 days ([facility])   ? ?  ?  ?This is a list of the screening recommended for you and due dates:  ?Health Maintenance  ?Topic Date Due  ? COVID-19 Vaccine (3 - Moderna risk series) 04/10/2021  ? Tetanus Vaccine  12/01/2029  ? Pneumonia Vaccine  Completed  ? Flu Shot  Completed  ? Zoster (Shingles) Vaccine  Completed  ? HPV Vaccine  Aged Out  ? ? ?

## 2021-08-10 ENCOUNTER — Other Ambulatory Visit (HOSPITAL_COMMUNITY)
Admission: RE | Admit: 2021-08-10 | Discharge: 2021-08-10 | Disposition: A | Discharge status: Home / Self Care | Payer: Medicare Other | Source: Skilled Nursing Facility | Attending: Internal Medicine | Admitting: Internal Medicine

## 2021-08-10 DIAGNOSIS — F331 Major depressive disorder, recurrent, moderate: Secondary | ICD-10-CM | POA: Diagnosis not present

## 2021-08-10 DIAGNOSIS — C61 Malignant neoplasm of prostate: Secondary | ICD-10-CM | POA: Diagnosis not present

## 2021-08-10 LAB — PSA: Prostatic Specific Antigen: 0.17 ng/mL (ref 0.00–4.00)

## 2021-08-11 LAB — TESTOSTERONE: Testosterone: <3 ng/dL — ABNORMAL LOW (ref 264–916)

## 2021-08-13 ENCOUNTER — Encounter: Payer: Self-pay | Admitting: Adult Health

## 2021-08-13 ENCOUNTER — Non-Acute Institutional Stay (SKILLED_NURSING_FACILITY): Payer: Medicare Other | Admitting: Adult Health

## 2021-08-13 DIAGNOSIS — I700 Atherosclerosis of aorta: Secondary | ICD-10-CM | POA: Diagnosis not present

## 2021-08-13 DIAGNOSIS — M25552 Pain in left hip: Secondary | ICD-10-CM | POA: Diagnosis not present

## 2021-08-13 DIAGNOSIS — F0390 Unspecified dementia without behavioral disturbance: Secondary | ICD-10-CM | POA: Diagnosis not present

## 2021-08-13 DIAGNOSIS — G8929 Other chronic pain: Secondary | ICD-10-CM

## 2021-08-13 DIAGNOSIS — E441 Mild protein-calorie malnutrition: Secondary | ICD-10-CM

## 2021-08-13 NOTE — Progress Notes (Signed)
Location:  [place] Room Number: 156-D Place of Service:  SNF (31)   CODE STATUS: DNR  Allergies  Allergen Reactions   Hctz [Hydrochlorothiazide]     hypokalemia    Chief Complaint  Patient presents with   Medical Management of Chronic Issues          Left hip pain:   Dementia without behavioral disturbance unspecified dementia type: Aortic atherosclerosis: Protein calorie malnutrition:    HPI:  He is a 88 year old long term resident of this facility being seen for the management of her chronic illnesses:   Left hip pain:   Dementia without behavioral disturbance unspecified dementia type: Aortic atherosclerosis: Protein calorie malnutrition. There are no reports of uncontrolled pain. He is spending more time in his room and in bed per his choice. He was started on prn xanax for 2 weeks by his psychologist.   Past Medical History:  Diagnosis Date   Anxiety    Cancer [facility])    prostate   Chronic chest wall pain    Essential hypertension, benign 06/20/2016   GAD (generalized anxiety disorder)    GERD (gastroesophageal reflux disease)    H/O echocardiogram 2016   normal   Heart murmur    "leaking heart valve"   High cholesterol 06/20/2016   HOH (hard of hearing)    left   Hx of falling    Interstitial pulmonary disease (HCC)    Lower GI bleed    MDD (major depressive disorder)    Murmur, cardiac    Poor historian    PUD (peptic ulcer disease)    RLS (restless legs syndrome)    Shortness of breath    Wrist fracture 12/16/2011    Past Surgical History:  Procedure Laterality Date   CHOLECYSTECTOMY N/A 06/28/2020   Procedure: LAPAROSCOPIC CHOLECYSTECTOMY;  Surgeon: [name], MD;  Location: [facility];  Service: General;  Laterality: N/A;  pt in [place]  2005   Rehman: Sigmoid colon diverticulosis, moderate sized external hemorrhoids. A few focal areas of mucosal erythema felt to be nonspecific.    ESOPHAGOGASTRODUODENOSCOPY     ESOPHAGOGASTRODUODENOSCOPY N/A 07/18/2016   Rehman: normal exam except duodenal scar. h.pylori serologies negative   FLEXIBLE SIGMOIDOSCOPY N/A 06/11/2020   Procedure: FLEXIBLE SIGMOIDOSCOPY;  Surgeon: Eloise Harman, DO;  Location: [facility];  Service: Endoscopy;  Laterality: N/A;   HARDWARE REMOVAL Right 03/20/2021   Procedure: HARDWARE REMOVAL right hip;  Surgeon: Carole Civil, MD;  Location: [facility];  Service: Orthopedics;  Laterality: Right;   HIP PINNING,CANNULATED Right 03/10/2020   Procedure: INTERNAL FIXATION RIGHT HIP;  Surgeon: Carole Civil, MD;  Location: [facility];  Service: Orthopedics;  Laterality: Right;   ORIF WRIST FRACTURE  12/19/2011   Procedure: OPEN REDUCTION INTERNAL FIXATION (ORIF) WRIST FRACTURE;  Surgeon: Carole Civil, MD;  Location: [facility];  Service: Orthopedics;  Laterality: Right;   prostate cancer     Diagnosed in the 90s   TONSILLECTOMY      Social History   Socioeconomic History   Marital status: Widowed    Spouse name: Not on file   Number of children: Not on file   Years of education: Not on file   Highest education level: Not on file  Occupational History   Occupation: retired    Fish farm manager: RETIRED  Tobacco Use   Smoking status: Never   Smokeless tobacco: Never  Vaping Use   Vaping Use: Never  used  Substance and Sexual Activity   Alcohol use: No   Drug use: No   Sexual activity: Not Currently  Other Topics Concern   Not on file  Social History Narrative   Is long term resident of [facility]    Social Determinants of Health   Financial Resource Strain: Not on file  Food Insecurity: Not on file  Transportation Needs: Not on file  Physical Activity: Not on file  Stress: Not on file  Social Connections: Not on file  Intimate Partner Violence: Not on file   Family History  Problem Relation Age of Onset   Cancer Other    Heart attack Mother    Cancer Brother    Heart attack Brother    Heart  disease Sister        by pass      VITAL SIGNS BP 137/61    Pulse 78    Temp 98.6 F (37 C)    Resp 16    Ht '5\' 9"'$  (1.753 m)    Wt 177 lb 12.8 oz (80.6 kg)    SpO2 97%    BMI 26.26 kg/m   Outpatient Encounter Medications as of 08/13/2021  Medication Sig   acetaminophen (TYLENOL) 500 MG tablet Take 500 mg by mouth every 6 (six) hours. Max '3000mg'$  in 24 hour period   alfuzosin (UROXATRAL) 10 MG 24 hr tablet Take 10 mg by mouth daily. 6 pm   ALPRAZolam (XANAX) 0.25 MG tablet Take 1 tablet (0.25 mg total) by mouth 2 (two) times daily as needed for up to 14 days for anxiety.   Ascorbic Acid (VITAMIN C) 1000 MG tablet Take 1,000 mg by mouth daily.   Balsam [place]-Castor Oil (VENELEX) OINT Apply 1 application topically every 8 (eight) hours as needed (Apply to bilateral buttocks & sacral area qshift for blanchable erythema.).   calcium carbonate (TUMS - DOSED IN MG ELEMENTAL CALCIUM) 500 MG chewable tablet Chew 1 tablet by mouth 2 (two) times daily as needed for indigestion or heartburn.    diltiazem (CARDIZEM CD) 360 MG 24 hr capsule Take 1 capsule (360 mg total) by mouth daily.   Flax Oil-Fish Oil-Borage Oil (FISH OIL-FLAX OIL-BORAGE OIL) CAPS Take 1 capsule by mouth daily.   hydrochlorothiazide (MICROZIDE) 12.5 MG capsule Take 12.5 mg by mouth daily.   ipratropium-albuterol (DUONEB) 0.5-2.5 (3) MG/3ML SOLN Take 3 mLs by nebulization every 6 (six) hours as needed (shortness of breath or wheezing).   isosorbide mononitrate (IMDUR) 30 MG 24 hr tablet Take 1 tablet (30 mg total) by mouth daily.   lisinopril (ZESTRIL) 40 MG tablet Take 1 tablet (40 mg total) by mouth daily.   loperamide (IMODIUM A-D) 2 MG tablet Take 2 mg by mouth as needed (after each stool. (do not exceed more than 4 tabs in 24 hours)).   loratadine (CLARITIN) 10 MG tablet Take 10 mg by mouth daily.   Melatonin 10 MG TABS Take 10 mg by mouth at bedtime.   meloxicam (MOBIC) 7.5 MG tablet Take 7.5 mg by mouth daily. give for left hip  pain   memantine (NAMENDA) 10 MG tablet Take 10 mg by mouth 2 (two) times daily.   methocarbamol (ROBAXIN) 500 MG tablet Take 500 mg by mouth daily.   metoprolol tartrate (LOPRESSOR) 25 MG tablet Take 1 tablet (25 mg total) by mouth 2 (two) times daily.   omeprazole (PRILOSEC) 20 MG capsule Take 1 capsule (20 mg total) by mouth daily before breakfast.   ondansetron (  ZOFRAN) 8 MG tablet Take 8 mg by mouth as needed for nausea.   OXYGEN Inhale 2 L into the lungs as needed (To keep O2 ststa > 90 %).   polyethylene glycol (MIRALAX / GLYCOLAX) 17 g packet Take 17 g by mouth daily. On Tuesday, Thursday, and Saturday   potassium chloride SA (KLOR-CON) 20 MEQ tablet Take 40 mEq by mouth 3 (three) times daily.   QUEtiapine (SEROQUEL) 25 MG tablet Take 12.5 mg by mouth 2 (two) times daily.   rOPINIRole (REQUIP) 0.25 MG tablet Take 0.25 mg by mouth at bedtime.   senna-docusate (SENOKOT-S) 8.6-50 MG tablet Take 1 tablet by mouth at bedtime as needed for mild constipation.   simethicone (MYLICON) 836 MG chewable tablet Chew 125 mg by mouth 3 (three) times daily as needed ("increased gas").   spironolactone (ALDACTONE) 25 MG tablet Take 25 mg by mouth daily.   UNABLE TO FIND Diet:Regular   venlafaxine XR (EFFEXOR-XR) 150 MG 24 hr capsule Take 150 mg by mouth daily with breakfast.   Vibegron (GEMTESA) 75 MG TABS Take 1 tablet by mouth daily.   No facility-administered encounter medications on file as of 08/13/2021.     SIGNIFICANT DIAGNOSTIC EXAMS  PREVIOUS   06-09-20: ct of abdomen and pelvis:  Cholelithiasis. Haziness noted around the gallbladder. Cannot exclude acute cholecystitis. Continued wall thickening in the left colon from the distal transverse colon through the distal descending colon concerning for colitis. Left colonic diverticulosis. Small bilateral pleural effusions, increasing since prior study. Bibasilar atelectasis. Coronary artery disease, aortic atherosclerosis.  06-09-20:  abdominal ultrasound:  1. Moderate gallbladder sludge with probable punctate stones. Slight increased wall thickness with small amount of fluid adjacent to the gallbladder but negative sonographic Murphy. Findings are suggestive of but not definitive for cholecystitis; consider correlation with nuclear medicine hepatobiliary imaging. 6 mm gallbladder polyp. 2. Simple appearing renal cysts. 3. Incidental note made of pleural effusions.  06-12-20: HIDA:  1. No scintigraphic evidence of acute cholecystitis. 2. Findings compatible with severe biliary dyskinesia with little to no gallbladder emptying and elicitation of abdominal pain with the CCK administration.  06-13-20: chest x-ray:  Diffuse interstitial opacity with Awanda Mink lines with possible trace pleural fluid. Stable heart size and aortic contours. Negative for air leak  NO NEW EXAMS.    LABS REVIEWED PREVIOUS    08-14-20: glucose 110; bun 33; creat 1.00 ;k+ 2.5 na++ 140 08-15-20: glucose 114; bun 31; creat  0.84; k+ 3.1; na++ 142 08-21-20: k+ 3.5 10-04-20: albumin 3.2  10-19-20: PSA 12.25 (13.05)  12-21-20: wbc 4.6; hgb 13.4 hct 40.9 mcv 86.1 plt 223; glucose 104; bun 22; creat 0.89; k+ 3.0; na++ 143; ca 9.3 GFR>60; liver normal albumin 3.3; chol 128; ldl 71; trig 85 hdl 40   12-25-20: k+ 3.2 01-01-21: k+ 3.2 01-08-21: k+ 4.5 01-19-21: glucose 108; bun 20; creat 0.96; k+ 3.6; na++ 139; ca 9.0 GFR>60 02-05-21: glucose 109; bun 25; creat 1.05; k+ 4.5; na++ 141; ca 9.5; GFR>60.  03-13-21: wbc 7.3; hgb 14.0; hct 42.5; mcv 85.5 plt 212; glucose 126; bun 24; creat 1.03 ;k+ 3.9; na++ 139; ca 9.6; GFR>60 03-15-21: hgb a1c 5.7 03-20-21: wbc 20.3; hgb 14.6; hct 44.4; mcv 86.7 plt 229; glcusoe 201; bun 26; creat 1.44; k+ 4.2; na++ 138; ca 9.2; GFR 46  03-22-21: aldosterone 7.6; cortisol AM 19.5;  03-26-21: wbc 6.3; hgb 12.4; hct 37.9; mcv 87.5 plt 230; glucose 117; bun 28; creat 1.13; k+ 4.4; na++ 138; ca 9.2; GFR>60  TODAY  04-23-21: PSA 0.6; testosterone:  <3 08-10-21: PSA 0.17 testosterone <3   Review of Systems  Constitutional:  Negative for malaise/fatigue.  Respiratory:  Negative for cough and shortness of breath.   Cardiovascular:  Negative for chest pain, palpitations and leg swelling.  Gastrointestinal:  Negative for abdominal pain, constipation and heartburn.  Musculoskeletal:  Negative for back pain, joint pain and myalgias.  Skin: Negative.   Neurological:  Negative for dizziness.  Psychiatric/Behavioral:  The patient is not nervous/anxious.    Physical Exam Constitutional:      General: He is not in acute distress.    Appearance: He is well-developed. He is not diaphoretic.  Neck:     Thyroid: No thyromegaly.  Cardiovascular:     Rate and Rhythm: Normal rate and regular rhythm.     Pulses: Normal pulses.     Heart sounds: Normal heart sounds.  Pulmonary:     Effort: Pulmonary effort is normal. No respiratory distress.     Breath sounds: Normal breath sounds.  Abdominal:     General: Bowel sounds are normal. There is no distension.     Palpations: Abdomen is soft.     Tenderness: There is no abdominal tenderness.  Musculoskeletal:        General: Normal range of motion.     Cervical back: Neck supple.     Right lower leg: No edema.     Left lower leg: No edema.  Lymphadenopathy:     Cervical: No cervical adenopathy.  Skin:    General: Skin is warm and dry.  Neurological:     Mental Status: He is alert. Mental status is at baseline.  Psychiatric:        Mood and Affect: Mood normal.      ASSESSMENT/ PLAN:  TODAY  Left hip pain: has had hardware removed in past: will continue mobic 7.5 mg daily robaxin 500 mg daily; pain is currently managed.   2. Dementia without behavioral disturbance unspecified dementia type: is without change weight is 177 pounds; will continue namenda 10 mg twice daily   3. Aortic atherosclerosis: (ct 06-09-20) will monitor is on asa 81 mg daily   4. Protein calorie malnutrition:  albumin 3.2 will monitor   PREVIOUS   5. Benign hypertension with chronic kidney disease stage III: is stable b/p 137/61 will continue lopressor 25 mg twice daily cardizem cd 360 mg daily lisinopril 40 mg daily hctz 12.5 mg daily aldactone 25 mg daily   6. Gastroesophageal reflux disease without esophagitis: is stable will continue prilosec 25 mg daily   7. Prostate cancer/over active bladder: is stable will continue urosatral 10 mg daily gemtesa 75 mg daily; no oxybutynin due to history of fracture; off mybtriq due to hypertension followed by urology   8.  Hypokalemia: is stable k+ 3.9 will continue k+ 40 meq three times daily   9. Dyslipidemia: is stable will continue fish oil daily is off zocor  10. Chronic constipation: will continue miralax three times weekly  and senna s as needed   11. CKD stage 3b: is stable bun 28 creat 1.13 GFR >60    12. Major depression recurrent chronic: continues to have anxiety: will continue effexor xr 150 mg daily; melatonin 10 mg nightly and will increase seroquel to 12.5 mg twice daily will monitor   13. RLS: is stable will continue requip 0.25 mg nightly   14. Iron deficiency anemia due to dietary intake: is stable hgb 14.0 off iron  15. Interstitial long disease: has duoneb every 6 hours as needed; has prn 02 claritin 10 mg daily    Will check cbc; cmp tsh   Ok Edwards NP [facility]  call [phone]

## 2021-08-15 DIAGNOSIS — F331 Major depressive disorder, recurrent, moderate: Secondary | ICD-10-CM | POA: Diagnosis not present

## 2021-08-16 ENCOUNTER — Encounter: Payer: Self-pay | Admitting: Adult Health

## 2021-08-16 ENCOUNTER — Other Ambulatory Visit (HOSPITAL_COMMUNITY)
Admission: RE | Admit: 2021-08-16 | Discharge: 2021-08-16 | Disposition: A | Discharge status: Home / Self Care | Payer: Medicare Other | Source: Skilled Nursing Facility | Attending: Adult Health | Admitting: Adult Health

## 2021-08-16 ENCOUNTER — Non-Acute Institutional Stay (SKILLED_NURSING_FACILITY): Payer: Medicare Other | Admitting: Adult Health

## 2021-08-16 DIAGNOSIS — N183 Chronic kidney disease, stage 3 unspecified: Secondary | ICD-10-CM | POA: Diagnosis not present

## 2021-08-16 DIAGNOSIS — R7989 Other specified abnormal findings of blood chemistry: Secondary | ICD-10-CM

## 2021-08-16 DIAGNOSIS — I129 Hypertensive chronic kidney disease with stage 1 through stage 4 chronic kidney disease, or unspecified chronic kidney disease: Secondary | ICD-10-CM | POA: Diagnosis not present

## 2021-08-16 LAB — COMPREHENSIVE METABOLIC PANEL
ALT: 19 U/L (ref 0–44)
AST: 12 U/L — ABNORMAL LOW (ref 15–41)
Albumin: 3.4 g/dL — ABNORMAL LOW (ref 3.5–5.0)
Alkaline Phosphatase: 59 U/L (ref 38–126)
Anion gap: 6 (ref 5–15)
BUN: 40 mg/dL — ABNORMAL HIGH (ref 8–23)
CO2: 24 mmol/L (ref 22–32)
Calcium: 9.3 mg/dL (ref 8.9–10.3)
Chloride: 108 mmol/L (ref 98–111)
Creatinine, Ser: 1.46 mg/dL — ABNORMAL HIGH (ref 0.61–1.24)
GFR, Estimated: 46 mL/min — ABNORMAL LOW (ref >60)
Glucose, Bld: 98 mg/dL (ref 70–99)
Potassium: 5.7 mmol/L — ABNORMAL HIGH (ref 3.5–5.1)
Sodium: 138 mmol/L (ref 135–145)
Total Bilirubin: 0.3 mg/dL (ref 0.3–1.2)
Total Protein: 5.8 g/dL — ABNORMAL LOW (ref 6.5–8.1)

## 2021-08-16 LAB — CBC
HCT: 37.1 % — ABNORMAL LOW (ref 39.0–52.0)
Hemoglobin: 12 g/dL — ABNORMAL LOW (ref 13.0–17.0)
MCH: 28.8 pg (ref 26.0–34.0)
MCHC: 32.3 g/dL (ref 30.0–36.0)
MCV: 89 fL (ref 80.0–100.0)
Platelets: 181 10*3/uL (ref 150–400)
RBC: 4.17 MIL/uL — ABNORMAL LOW (ref 4.22–5.81)
RDW: 13.8 % (ref 11.5–15.5)
WBC: 6 10*3/uL (ref 4.0–10.5)
nRBC: 0 % (ref 0.0–0.2)

## 2021-08-16 LAB — TSH: TSH: 0.62 u[IU]/mL (ref 0.350–4.500)

## 2021-08-16 NOTE — Progress Notes (Signed)
Location:  [facility] Room Number: 156 Place of Service:  SNF (31)   CODE STATUS: dnr  Allergies  Allergen Reactions   Hctz [Hydrochlorothiazide]     hypokalemia    Chief Complaint  Patient presents with   Acute Visit    Follow up labs.     HPI:  Hie k+ level is slightly elevated with a borderline low tsh. He denies any pain. He denies any excessive lethargy. He denies any agitation or anxiety   Past Medical History:  Diagnosis Date   Anxiety    Cancer [facility])    prostate   Chronic chest wall pain    Essential hypertension, benign 06/20/2016   GAD (generalized anxiety disorder)    GERD (gastroesophageal reflux disease)    H/O echocardiogram 2016   normal   Heart murmur    "leaking heart valve"   High cholesterol 06/20/2016   HOH (hard of hearing)    left   Hx of falling    Interstitial pulmonary disease (HCC)    Lower GI bleed    MDD (major depressive disorder)    Murmur, cardiac    Poor historian    PUD (peptic ulcer disease)    RLS (restless legs syndrome)    Shortness of breath    Wrist fracture 12/16/2011    Past Surgical History:  Procedure Laterality Date   CHOLECYSTECTOMY N/A 06/28/2020   Procedure: LAPAROSCOPIC CHOLECYSTECTOMY;  Surgeon: [name], MD;  Location: [facility] ORS;  Service: General;  Laterality: N/A;  pt in [place]  2005   Rehman: Sigmoid colon diverticulosis, moderate sized external hemorrhoids. A few focal areas of mucosal erythema felt to be nonspecific.   ESOPHAGOGASTRODUODENOSCOPY     ESOPHAGOGASTRODUODENOSCOPY N/A 07/18/2016   Rehman: normal exam except duodenal scar. h.pylori serologies negative   FLEXIBLE SIGMOIDOSCOPY N/A 06/11/2020   Procedure: FLEXIBLE SIGMOIDOSCOPY;  Surgeon: Eloise Harman, DO;  Location: [facility] ENDO SUITE;  Service: Endoscopy;  Laterality: N/A;   HARDWARE REMOVAL Right 03/20/2021   Procedure: HARDWARE REMOVAL right hip;  Surgeon: Carole Civil, MD;  Location:  [facility] ORS;  Service: Orthopedics;  Laterality: Right;   HIP PINNING,CANNULATED Right 03/10/2020   Procedure: INTERNAL FIXATION RIGHT HIP;  Surgeon: Carole Civil, MD;  Location: [facility] ORS;  Service: Orthopedics;  Laterality: Right;   ORIF WRIST FRACTURE  12/19/2011   Procedure: OPEN REDUCTION INTERNAL FIXATION (ORIF) WRIST FRACTURE;  Surgeon: Carole Civil, MD;  Location: [facility] ORS;  Service: Orthopedics;  Laterality: Right;   prostate cancer     Diagnosed in the 90s   TONSILLECTOMY      Social History   Socioeconomic History   Marital status: Widowed    Spouse name: Not on file   Number of children: Not on file   Years of education: Not on file   Highest education level: Not on file  Occupational History   Occupation: retired    Fish farm manager: RETIRED  Tobacco Use   Smoking status: Never   Smokeless tobacco: Never  Vaping Use   Vaping Use: Never used  Substance and Sexual Activity   Alcohol use: No   Drug use: No   Sexual activity: Not Currently  Other Topics Concern   Not on file  Social History Narrative   Is long term resident of [facility]    Social Determinants of Health   Financial Resource Strain: Not on file  Food Insecurity: Not on file  Transportation Needs: Not  on file  Physical Activity: Not on file  Stress: Not on file  Social Connections: Not on file  Intimate Partner Violence: Not on file   Family History  Problem Relation Age of Onset   Cancer Other    Heart attack Mother    Cancer Brother    Heart attack Brother    Heart disease Sister        by pass      VITAL SIGNS BP 137/61    Pulse 78    Temp 98.6 F (37 C)    Resp 16    Ht '5\' 9"'$  (1.753 m)    Wt 174 lb 9.6 oz (79.2 kg)    SpO2 97%    BMI 25.78 kg/m   Outpatient Encounter Medications as of 08/16/2021  Medication Sig   acetaminophen (TYLENOL) 500 MG tablet Take 500 mg by mouth every 6 (six) hours. Max '3000mg'$  in 24 hour period   alfuzosin (UROXATRAL) 10 MG 24 hr tablet Take 10 mg by mouth daily. 6  pm   ALPRAZolam (XANAX) 0.25 MG tablet Take 1 tablet (0.25 mg total) by mouth 2 (two) times daily as needed for up to 14 days for anxiety.   Ascorbic Acid (VITAMIN C) 1000 MG tablet Take 1,000 mg by mouth daily.   Balsam [place]-Castor Oil (VENELEX) OINT Apply 1 application topically every 8 (eight) hours as needed (Apply to bilateral buttocks & sacral area qshift for blanchable erythema.).   calcium carbonate (TUMS - DOSED IN MG ELEMENTAL CALCIUM) 500 MG chewable tablet Chew 1 tablet by mouth 2 (two) times daily as needed for indigestion or heartburn.    diltiazem (CARDIZEM CD) 360 MG 24 hr capsule Take 1 capsule (360 mg total) by mouth daily.   Flax Oil-Fish Oil-Borage Oil (FISH OIL-FLAX OIL-BORAGE OIL) CAPS Take 1 capsule by mouth daily.   hydrochlorothiazide (MICROZIDE) 12.5 MG capsule Take 12.5 mg by mouth daily.   ipratropium-albuterol (DUONEB) 0.5-2.5 (3) MG/3ML SOLN Take 3 mLs by nebulization every 6 (six) hours as needed (shortness of breath or wheezing).   isosorbide mononitrate (IMDUR) 30 MG 24 hr tablet Take 1 tablet (30 mg total) by mouth daily.   lisinopril (ZESTRIL) 40 MG tablet Take 1 tablet (40 mg total) by mouth daily.   loperamide (IMODIUM A-D) 2 MG tablet Take 2 mg by mouth as needed (after each stool. (do not exceed more than 4 tabs in 24 hours)).   loratadine (CLARITIN) 10 MG tablet Take 10 mg by mouth daily.   Melatonin 10 MG TABS Take 10 mg by mouth at bedtime.   meloxicam (MOBIC) 7.5 MG tablet Take 7.5 mg by mouth daily. give for left hip pain   memantine (NAMENDA) 10 MG tablet Take 10 mg by mouth 2 (two) times daily.   methocarbamol (ROBAXIN) 500 MG tablet Take 500 mg by mouth daily.   metoprolol tartrate (LOPRESSOR) 25 MG tablet Take 1 tablet (25 mg total) by mouth 2 (two) times daily.   omeprazole (PRILOSEC) 20 MG capsule Take 1 capsule (20 mg total) by mouth daily before breakfast.   ondansetron (ZOFRAN) 8 MG tablet Take 8 mg by mouth as needed for nausea.   OXYGEN  Inhale 2 L into the lungs as needed (To keep O2 ststa > 90 %).   polyethylene glycol (MIRALAX / GLYCOLAX) 17 g packet Take 17 g by mouth daily. On Tuesday, Thursday, and Saturday   potassium chloride SA (KLOR-CON) 20 MEQ tablet Take 40 mEq by mouth 3 (three)  times daily.   QUEtiapine (SEROQUEL) 25 MG tablet Take 12.5 mg by mouth 2 (two) times daily.   rOPINIRole (REQUIP) 0.25 MG tablet Take 0.25 mg by mouth at bedtime.   senna-docusate (SENOKOT-S) 8.6-50 MG tablet Take 1 tablet by mouth at bedtime as needed for mild constipation.   simethicone (MYLICON) 759 MG chewable tablet Chew 125 mg by mouth 3 (three) times daily as needed ("increased gas").   spironolactone (ALDACTONE) 25 MG tablet Take 25 mg by mouth daily.   UNABLE TO FIND Diet:Regular   venlafaxine XR (EFFEXOR-XR) 150 MG 24 hr capsule Take 150 mg by mouth daily with breakfast.   Vibegron (GEMTESA) 75 MG TABS Take 1 tablet by mouth daily.   No facility-administered encounter medications on file as of 08/16/2021.     SIGNIFICANT DIAGNOSTIC EXAMS  PREVIOUS   06-09-20: ct of abdomen and pelvis:  Cholelithiasis. Haziness noted around the gallbladder. Cannot exclude acute cholecystitis. Continued wall thickening in the left colon from the distal transverse colon through the distal descending colon concerning for colitis. Left colonic diverticulosis. Small bilateral pleural effusions, increasing since prior study. Bibasilar atelectasis. Coronary artery disease, aortic atherosclerosis.  06-09-20: abdominal ultrasound:  1. Moderate gallbladder sludge with probable punctate stones. Slight increased wall thickness with small amount of fluid adjacent to the gallbladder but negative sonographic Murphy. Findings are suggestive of but not definitive for cholecystitis; consider correlation with nuclear medicine hepatobiliary imaging. 6 mm gallbladder polyp. 2. Simple appearing renal cysts. 3. Incidental note made of pleural  effusions.  06-12-20: HIDA:  1. No scintigraphic evidence of acute cholecystitis. 2. Findings compatible with severe biliary dyskinesia with little to no gallbladder emptying and elicitation of abdominal pain with the CCK administration.  06-13-20: chest x-ray:  Diffuse interstitial opacity with Awanda Mink lines with possible trace pleural fluid. Stable heart size and aortic contours. Negative for air leak  NO NEW EXAMS.    LABS REVIEWED PREVIOUS    08-14-20: glucose 110; bun 33; creat 1.00 ;k+ 2.5 na++ 140 08-15-20: glucose 114; bun 31; creat  0.84; k+ 3.1; na++ 142 08-21-20: k+ 3.5 10-04-20: albumin 3.2  10-19-20: PSA 12.25 (13.05)  12-21-20: wbc 4.6; hgb 13.4 hct 40.9 mcv 86.1 plt 223; glucose 104; bun 22; creat 0.89; k+ 3.0; na++ 143; ca 9.3 GFR>60; liver normal albumin 3.3; chol 128; ldl 71; trig 85 hdl 40   12-25-20: k+ 3.2 01-01-21: k+ 3.2 01-08-21: k+ 4.5 01-19-21: glucose 108; bun 20; creat 0.96; k+ 3.6; na++ 139; ca 9.0 GFR>60 02-05-21: glucose 109; bun 25; creat 1.05; k+ 4.5; na++ 141; ca 9.5; GFR>60.  03-13-21: wbc 7.3; hgb 14.0; hct 42.5; mcv 85.5 plt 212; glucose 126; bun 24; creat 1.03 ;k+ 3.9; na++ 139; ca 9.6; GFR>60 03-15-21: hgb a1c 5.7 03-20-21: wbc 20.3; hgb 14.6; hct 44.4; mcv 86.7 plt 229; glcusoe 201; bun 26; creat 1.44; k+ 4.2; na++ 138; ca 9.2; GFR 46  03-22-21: aldosterone 7.6; cortisol AM 19.5;  03-26-21: wbc 6.3; hgb 12.4; hct 37.9; mcv 87.5 plt 230; glucose 117; bun 28; creat 1.13; k+ 4.4; na++ 138; ca 9.2; GFR>60   TODAY  04-23-21: PSA 0.6; testosterone: <3 08-10-21: PSA 0.17 testosterone <3 08-16-21: wbc 6.0; hgb 12.0; hct 37.1; mcv 89.0 plt 181; glucose 98; bun 40; creat 1.46; k+ 5.7; na++ 138; ca 9.3 GFR>46 protein 5.8; albumin 3.4; tsh 0.620  Review of Systems  Constitutional:  Negative for malaise/fatigue.  Respiratory:  Negative for cough and shortness of breath.   Cardiovascular:  Negative for  chest pain, palpitations and leg swelling.  Gastrointestinal:  Negative  for abdominal pain, constipation and heartburn.  Musculoskeletal:  Negative for back pain, joint pain and myalgias.  Skin: Negative.   Neurological:  Negative for dizziness.  Psychiatric/Behavioral:  The patient is not nervous/anxious.     Physical Exam Constitutional:      General: He is not in acute distress.    Appearance: He is well-developed. He is not diaphoretic.  Neck:     Thyroid: No thyromegaly.  Cardiovascular:     Rate and Rhythm: Normal rate and regular rhythm.     Pulses: Normal pulses.     Heart sounds: Normal heart sounds.  Pulmonary:     Effort: Pulmonary effort is normal. No respiratory distress.     Breath sounds: Normal breath sounds.  Abdominal:     General: Bowel sounds are normal. There is no distension.     Palpations: Abdomen is soft.     Tenderness: There is no abdominal tenderness.  Musculoskeletal:        General: Normal range of motion.     Cervical back: Neck supple.     Right lower leg: No edema.     Left lower leg: Edema present.  Lymphadenopathy:     Cervical: No cervical adenopathy.  Skin:    General: Skin is warm and dry.  Neurological:     Mental Status: He is alert. Mental status is at baseline.  Psychiatric:        Mood and Affect: Mood normal.     ASSESSMENT/ PLAN:  TODAY  Benign hypertension with chronic kidney disease stage III Low tsh level  Will stop spironolactone and hctz Will lower k+ to 20 meq twice daily  Will check bmp; free t3; freet4; on 08-24-21.       Ok Edwards NP [facility]  call [phone]

## 2021-08-17 DIAGNOSIS — R7989 Other specified abnormal findings of blood chemistry: Secondary | ICD-10-CM | POA: Insufficient documentation

## 2021-08-17 HISTORY — DX: Other specified abnormal findings of blood chemistry: R79.89

## 2021-08-23 ENCOUNTER — Encounter: Payer: Self-pay | Admitting: Adult Health

## 2021-08-23 ENCOUNTER — Non-Acute Institutional Stay (SKILLED_NURSING_FACILITY): Payer: Medicare Other | Admitting: Adult Health

## 2021-08-23 DIAGNOSIS — F19951 Other psychoactive substance use, unspecified with psychoactive substance-induced psychotic disorder with hallucinations: Secondary | ICD-10-CM | POA: Diagnosis not present

## 2021-08-23 NOTE — Progress Notes (Signed)
?Location:  [facility] Room Number: 156-D ?Place of Service:  SNF (31) ? ? ?CODE STATUS: DNR ? ?Allergies  ?Allergen Reactions  ? Hctz [Hydrochlorothiazide]   ?  hypokalemia  ? ? ?Chief Complaint  ?Patient presents with  ? Acute Visit  ?  Hallucinations   ? ? ?HPI: ? ?He has been started on prn xanax about 2 weeks ago. He has been utilizing this medication on a daily basis. He is now reporting having hallucinations. He is seeing dogs and cats and is hoarding food to feed them. He feels that adding xanax has caused these hallucinations.  ? ?Past Medical History:  ?Diagnosis Date  ? Anxiety   ? Cancer [facility])   ? prostate  ? Chronic chest wall pain   ? Essential hypertension, benign 06/20/2016  ? GAD (generalized anxiety disorder)   ? GERD (gastroesophageal reflux disease)   ? H/O echocardiogram 2016  ? normal  ? Heart murmur   ? "leaking heart valve"  ? High cholesterol 06/20/2016  ? HOH (hard of hearing)   ? left  ? Hx of falling   ? Interstitial pulmonary disease ([place])   ? Lower GI bleed   ? MDD (major depressive disorder)   ? Murmur, cardiac   ? Poor historian   ? PUD (peptic ulcer disease)   ? RLS (restless legs syndrome)   ? Shortness of breath   ? Wrist fracture 12/16/2011  ? ? ?Past Surgical History:  ?Procedure Laterality Date  ? CHOLECYSTECTOMY N/A 06/28/2020  ? Procedure: LAPAROSCOPIC CHOLECYSTECTOMY;  Surgeon: [name], MD;  Location: [facility] ORS;  Service: General;  Laterality: N/A;  pt in [place]  ? COLONOSCOPY  2005  ? Rehman: Sigmoid colon diverticulosis, moderate sized external hemorrhoids. A few focal areas of mucosal erythema felt to be nonspecific.  ? ESOPHAGOGASTRODUODENOSCOPY    ? ESOPHAGOGASTRODUODENOSCOPY N/A 07/18/2016  ? Rehman: normal exam except duodenal scar. h.pylori serologies negative  ? FLEXIBLE SIGMOIDOSCOPY N/A 06/11/2020  ? Procedure: FLEXIBLE SIGMOIDOSCOPY;  Surgeon: Eloise Harman, DO;  Location: [facility];  Service: Endoscopy;  Laterality: N/A;  ?  HARDWARE REMOVAL Right 03/20/2021  ? Procedure: HARDWARE REMOVAL right hip;  Surgeon: Carole Civil, MD;  Location: [facility] ORS;  Service: Orthopedics;  Laterality: Right;  ? HIP PINNING,CANNULATED Right 03/10/2020  ? Procedure: INTERNAL FIXATION RIGHT HIP;  Surgeon: Carole Civil, MD;  Location: [facility] ORS;  Service: Orthopedics;  Laterality: Right;  ? ORIF WRIST FRACTURE  12/19/2011  ? Procedure: OPEN REDUCTION INTERNAL FIXATION (ORIF) WRIST FRACTURE;  Surgeon: Carole Civil, MD;  Location: [facility] ORS;  Service: Orthopedics;  Laterality: Right;  ? prostate cancer    ? Diagnosed in the 90s  ? TONSILLECTOMY    ? ? ?Social History  ? ?Socioeconomic History  ? Marital status: Widowed  ?  Spouse name: Not on file  ? Number of children: Not on file  ? Years of education: Not on file  ? Highest education level: Not on file  ?Occupational History  ? Occupation: retired  ?  Employer: RETIRED  ?Tobacco Use  ? Smoking status: Never  ? Smokeless tobacco: Never  ?Vaping Use  ? Vaping Use: Never used  ?Substance and Sexual Activity  ? Alcohol use: No  ? Drug use: No  ? Sexual activity: Not Currently  ?Other Topics Concern  ? Not on file  ?Social History Narrative  ? Is long term resident of [facility]   ? ?Social Determinants  of Health  ? ?Financial Resource Strain: Not on file  ?Food Insecurity: Not on file  ?Transportation Needs: Not on file  ?Physical Activity: Not on file  ?Stress: Not on file  ?Social Connections: Not on file  ?Intimate Partner Violence: Not on file  ? ?Family History  ?Problem Relation Age of Onset  ? Cancer Other   ? Heart attack Mother   ? Cancer Brother   ? Heart attack Brother   ? Heart disease Sister   ?     by pass  ? ? ? ? ?VITAL SIGNS ?BP 118/76   Pulse 78   Temp 98.6 ?F (37 ?C)   Resp 20   Ht '5\' 9"'$  (1.753 m)   Wt 177 lb (80.3 kg)   SpO2 97%   BMI 26.14 kg/m?  ? ?Outpatient Encounter Medications as of 08/23/2021  ?Medication Sig  ? acetaminophen (TYLENOL) 500 MG tablet Take 500 mg by mouth every  6 (six) hours. Max '3000mg'$  in 24 hour period  ? alfuzosin (UROXATRAL) 10 MG 24 hr tablet Take 10 mg by mouth daily. 6 pm  ? ALPRAZolam (XANAX) 0.25 MG tablet Take 1 tablet (0.25 mg total) by mouth 2 (two) times daily as needed for up to 14 days for anxiety.  ? Ascorbic Acid (VITAMIN C) 1000 MG tablet Take 1,000 mg by mouth daily.  ? Balsam Engineer, materials (VENELEX) OINT Apply 1 application topically every 8 (eight) hours as needed (Apply to bilateral buttocks & sacral area qshift for blanchable erythema.).  ? calcium carbonate (TUMS - DOSED IN MG ELEMENTAL CALCIUM) 500 MG chewable tablet Chew 1 tablet by mouth 2 (two) times daily as needed for indigestion or heartburn.   ? diltiazem (CARDIZEM CD) 360 MG 24 hr capsule Take 1 capsule (360 mg total) by mouth daily.  ? Flax Oil-Fish Oil-Borage Oil (FISH OIL-FLAX OIL-BORAGE OIL) CAPS Take 1 capsule by mouth daily.  ? ipratropium-albuterol (DUONEB) 0.5-2.5 (3) MG/3ML SOLN Take 3 mLs by nebulization every 6 (six) hours as needed (shortness of breath or wheezing).  ? isosorbide mononitrate (IMDUR) 30 MG 24 hr tablet Take 1 tablet (30 mg total) by mouth daily.  ? lisinopril (ZESTRIL) 40 MG tablet Take 1 tablet (40 mg total) by mouth daily.  ? loperamide (IMODIUM A-D) 2 MG tablet Take 2 mg by mouth as needed (after each stool. (do not exceed more than 4 tabs in 24 hours)).  ? loratadine (CLARITIN) 10 MG tablet Take 10 mg by mouth daily.  ? Melatonin 10 MG TABS Take 10 mg by mouth at bedtime.  ? meloxicam (MOBIC) 7.5 MG tablet Take 7.5 mg by mouth daily. give for left hip pain  ? memantine (NAMENDA) 10 MG tablet Take 10 mg by mouth 2 (two) times daily.  ? methocarbamol (ROBAXIN) 500 MG tablet Take 500 mg by mouth daily.  ? metoprolol tartrate (LOPRESSOR) 25 MG tablet Take 1 tablet (25 mg total) by mouth 2 (two) times daily.  ? omeprazole (PRILOSEC) 20 MG capsule Take 1 capsule (20 mg total) by mouth daily before breakfast.  ? ondansetron (ZOFRAN) 8 MG tablet Take 8 mg by mouth  as needed for nausea.  ? OXYGEN Inhale 2 L into the lungs as needed (To keep O2 ststa > 90 %).  ? polyethylene glycol (MIRALAX / GLYCOLAX) 17 g packet Take 17 g by mouth daily. On Tuesday, Thursday, and Saturday  ? potassium chloride SA (KLOR-CON) 20 MEQ tablet Take 20 mEq by mouth 2 (two) times daily.  ?  QUEtiapine (SEROQUEL) 25 MG tablet Take 12.5 mg by mouth 2 (two) times daily.  ? rOPINIRole (REQUIP) 0.25 MG tablet Take 0.25 mg by mouth at bedtime.  ? senna-docusate (SENOKOT-S) 8.6-50 MG tablet Take 1 tablet by mouth at bedtime as needed for mild constipation.  ? simethicone (MYLICON) 782 MG chewable tablet Chew 125 mg by mouth 3 (three) times daily as needed ("increased gas").  ? UNABLE TO FIND Diet:Regular  ? venlafaxine XR (EFFEXOR-XR) 150 MG 24 hr capsule Take 150 mg by mouth daily with breakfast.  ? Vibegron (GEMTESA) 75 MG TABS Take 1 tablet by mouth daily as needed.  ? [DISCONTINUED] hydrochlorothiazide (MICROZIDE) 12.5 MG capsule Take 12.5 mg by mouth daily.  ? [DISCONTINUED] spironolactone (ALDACTONE) 25 MG tablet Take 25 mg by mouth daily.  ? [DISCONTINUED] Vibegron (GEMTESA) 75 MG TABS Take 1 tablet by mouth daily. (Patient not taking: Reported on 08/23/2021)  ? ?No facility-administered encounter medications on file as of 08/23/2021.  ? ? ? ?SIGNIFICANT DIAGNOSTIC EXAMS ? ? ?PREVIOUS  ? ?06-09-20: ct of abdomen and pelvis:  ?Cholelithiasis. Haziness noted around the gallbladder. Cannot exclude acute cholecystitis. ?Continued wall thickening in the left colon from the distal transverse colon through the distal descending colon concerning for colitis. ?Left colonic diverticulosis. ?Small bilateral pleural effusions, increasing since prior study. ?Bibasilar atelectasis. ?Coronary artery disease, aortic atherosclerosis. ? ?06-09-20: abdominal ultrasound:  ?1. Moderate gallbladder sludge with probable punctate stones. Slight increased wall thickness with small amount of fluid adjacent to the gallbladder but  negative sonographic Murphy. Findings are suggestive of but not definitive for cholecystitis; consider correlation with nuclear medicine hepatobiliary imaging. 6 mm gallbladder polyp. ?2. Simple appearing

## 2021-08-24 ENCOUNTER — Encounter: Payer: Self-pay | Admitting: Adult Health

## 2021-08-24 ENCOUNTER — Other Ambulatory Visit (HOSPITAL_COMMUNITY)
Admission: RE | Admit: 2021-08-24 | Discharge: 2021-08-24 | Disposition: A | Discharge status: Home / Self Care | Payer: Medicare Other | Source: Skilled Nursing Facility | Attending: Adult Health | Admitting: Adult Health

## 2021-08-24 ENCOUNTER — Non-Acute Institutional Stay (SKILLED_NURSING_FACILITY): Payer: Medicare Other | Admitting: Adult Health

## 2021-08-24 DIAGNOSIS — N183 Chronic kidney disease, stage 3 unspecified: Secondary | ICD-10-CM | POA: Diagnosis not present

## 2021-08-24 DIAGNOSIS — I129 Hypertensive chronic kidney disease with stage 1 through stage 4 chronic kidney disease, or unspecified chronic kidney disease: Secondary | ICD-10-CM | POA: Insufficient documentation

## 2021-08-24 DIAGNOSIS — F331 Major depressive disorder, recurrent, moderate: Secondary | ICD-10-CM | POA: Diagnosis not present

## 2021-08-24 DIAGNOSIS — N1832 Chronic kidney disease, stage 3b: Secondary | ICD-10-CM | POA: Diagnosis not present

## 2021-08-24 LAB — BASIC METABOLIC PANEL
Anion gap: 8 (ref 5–15)
BUN: 46 mg/dL — ABNORMAL HIGH (ref 8–23)
CO2: 27 mmol/L (ref 22–32)
Calcium: 9.5 mg/dL (ref 8.9–10.3)
Chloride: 106 mmol/L (ref 98–111)
Creatinine, Ser: 2.07 mg/dL — ABNORMAL HIGH (ref 0.61–1.24)
GFR, Estimated: 30 mL/min — ABNORMAL LOW (ref >60)
Glucose, Bld: 104 mg/dL — ABNORMAL HIGH (ref 70–99)
Potassium: 4.7 mmol/L (ref 3.5–5.1)
Sodium: 141 mmol/L (ref 135–145)

## 2021-08-24 LAB — T4, FREE: Free T4: 0.83 ng/dL (ref 0.61–1.12)

## 2021-08-24 NOTE — Progress Notes (Signed)
?Location:  [place] ?Nursing Home Room Number: 156-D ?Place of Service:  SNF (31) ? ? ?CODE STATUS: DNR ? ?Allergies  ?Allergen Reactions  ? Hctz [Hydrochlorothiazide]   ?  hypokalemia  ? ? ?Chief Complaint  ?Patient presents with  ? Acute Visit  ?  Lab follow-up  ? ? ?HPI: ? ?His renal function is deteriorating. His creat is 2.07; with GFR of 30. He is presently taking lisinopril 40 mg daily for hypertension. He is not complaining of pain; no changes in vision; no dizziness.  ? ? ?Past Medical History:  ?Diagnosis Date  ? Anxiety   ? Cancer [facility])   ? prostate  ? Chronic chest wall pain   ? Essential hypertension, benign 06/20/2016  ? GAD (generalized anxiety disorder)   ? GERD (gastroesophageal reflux disease)   ? H/O echocardiogram 2016  ? normal  ? Heart murmur   ? "leaking heart valve"  ? High cholesterol 06/20/2016  ? HOH (hard of hearing)   ? left  ? Hx of falling   ? Interstitial pulmonary disease ([place])   ? Lower GI bleed   ? MDD (major depressive disorder)   ? Murmur, cardiac   ? Poor historian   ? PUD (peptic ulcer disease)   ? RLS (restless legs syndrome)   ? Shortness of breath   ? Wrist fracture 12/16/2011  ? ? ?Past Surgical History:  ?Procedure Laterality Date  ? CHOLECYSTECTOMY N/A 06/28/2020  ? Procedure: LAPAROSCOPIC CHOLECYSTECTOMY;  Surgeon: [name], MD;  Location: [facility];  Service: General;  Laterality: N/A;  pt in [place]  ? COLONOSCOPY  2005  ? Rehman: Sigmoid colon diverticulosis, moderate sized external hemorrhoids. A few focal areas of mucosal erythema felt to be nonspecific.  ? ESOPHAGOGASTRODUODENOSCOPY    ? ESOPHAGOGASTRODUODENOSCOPY N/A 07/18/2016  ? Rehman: normal exam except duodenal scar. h.pylori serologies negative  ? FLEXIBLE SIGMOIDOSCOPY N/A 06/11/2020  ? Procedure: FLEXIBLE SIGMOIDOSCOPY;  Surgeon: Eloise Harman, DO;  [facility];  Service: Endoscopy;  Laterality: N/A;  ? HARDWARE REMOVAL Right 03/20/2021  ? Procedure: HARDWARE REMOVAL  right hip;  Surgeon: Carole Civil, MD;  Location: [facility];  Service: Orthopedics;  Laterality: Right;  ? HIP PINNING,CANNULATED Right 03/10/2020  ? Procedure: INTERNAL FIXATION RIGHT HIP;  Surgeon: Carole Civil, MD;  Location: [facility];  Service: Orthopedics;  Laterality: Right;  ? ORIF WRIST FRACTURE  12/19/2011  ? Procedure: OPEN REDUCTION INTERNAL FIXATION (ORIF) WRIST FRACTURE;  Surgeon: Carole Civil, MD;  Location: [facility];  Service: Orthopedics;  Laterality: Right;  ? prostate cancer    ? Diagnosed in the 90s  ? TONSILLECTOMY    ? ? ?Social History  ? ?Socioeconomic History  ? Marital status: Widowed  ?  Spouse name: Not on file  ? Number of children: Not on file  ? Years of education: Not on file  ? Highest education level: Not on file  ?Occupational History  ? Occupation: retired  ?  Employer: RETIRED  ?Tobacco Use  ? Smoking status: Never  ? Smokeless tobacco: Never  ?Vaping Use  ? Vaping Use: Never used  ?Substance and Sexual Activity  ? Alcohol use: No  ? Drug use: No  ? Sexual activity: Not Currently  ?Other Topics Concern  ? Not on file  ?Social History Narrative  ? Is long term resident of [facility]   ? ?Social Determinants of Health  ? ?Financial Resource Strain: Not on file  ?Food Insecurity:  Not on file  ?Transportation Needs: Not on file  ?Physical Activity: Not on file  ?Stress: Not on file  ?Social Connections: Not on file  ?Intimate Partner Violence: Not on file  ? ?Family History  ?Problem Relation Age of Onset  ? Cancer Other   ? Heart attack Mother   ? Cancer Brother   ? Heart attack Brother   ? Heart disease Sister   ?     by pass  ? ? ? ? ?VITAL SIGNS ?BP 111/64   Pulse 64   Temp 98.6 ?F (37 ?C)   Resp 20   Ht '5\' 9"'$  (1.753 m)   Wt 177 lb (80.3 kg)   SpO2 97%   BMI 26.14 kg/m?  ? ?Outpatient Encounter Medications as of 08/24/2021  ?Medication Sig  ? acetaminophen (TYLENOL) 500 MG tablet Take 500 mg by mouth every 6 (six) hours. Max '3000mg'$  in 24 hour period  ? alfuzosin  (UROXATRAL) 10 MG 24 hr tablet Take 10 mg by mouth daily. 6 pm  ? Ascorbic Acid (VITAMIN C) 1000 MG tablet Take 1,000 mg by mouth daily.  ? Balsam Engineer, materials (VENELEX) OINT Apply 1 application topically every 8 (eight) hours as needed (Apply to bilateral buttocks & sacral area qshift for blanchable erythema.).  ? calcium carbonate (TUMS - DOSED IN MG ELEMENTAL CALCIUM) 500 MG chewable tablet Chew 1 tablet by mouth 2 (two) times daily as needed for indigestion or heartburn.   ? diltiazem (CARDIZEM CD) 360 MG 24 hr capsule Take 1 capsule (360 mg total) by mouth daily.  ? Flax Oil-Fish Oil-Borage Oil (FISH OIL-FLAX OIL-BORAGE OIL) CAPS Take 1 capsule by mouth daily.  ? ipratropium-albuterol (DUONEB) 0.5-2.5 (3) MG/3ML SOLN Take 3 mLs by nebulization every 6 (six) hours as needed (shortness of breath or wheezing).  ? isosorbide mononitrate (IMDUR) 30 MG 24 hr tablet Take 1 tablet (30 mg total) by mouth daily.  ? loperamide (IMODIUM A-D) 2 MG tablet Take 2 mg by mouth as needed (after each stool. (do not exceed more than 4 tabs in 24 hours)).  ? loratadine (CLARITIN) 10 MG tablet Take 10 mg by mouth daily.  ? Melatonin 10 MG TABS Take 10 mg by mouth at bedtime.  ? meloxicam (MOBIC) 7.5 MG tablet Take 7.5 mg by mouth daily. give for left hip pain  ? memantine (NAMENDA) 10 MG tablet Take 10 mg by mouth 2 (two) times daily.  ? methocarbamol (ROBAXIN) 500 MG tablet Take 500 mg by mouth daily.  ? metoprolol tartrate (LOPRESSOR) 25 MG tablet Take 1 tablet (25 mg total) by mouth 2 (two) times daily.  ? omeprazole (PRILOSEC) 20 MG capsule Take 1 capsule (20 mg total) by mouth daily before breakfast.  ? ondansetron (ZOFRAN) 8 MG tablet Take 8 mg by mouth as needed for nausea.  ? OXYGEN Inhale 2 L into the lungs as needed (To keep O2 ststa > 90 %).  ? polyethylene glycol (MIRALAX / GLYCOLAX) 17 g packet Take 17 g by mouth daily. On Tuesday, Thursday, and Saturday  ? potassium chloride SA (KLOR-CON) 20 MEQ tablet Take 20 mEq  by mouth 2 (two) times daily.  ? QUEtiapine (SEROQUEL) 25 MG tablet Take 12.5 mg by mouth 2 (two) times daily.  ? rOPINIRole (REQUIP) 0.25 MG tablet Take 0.25 mg by mouth at bedtime.  ? senna-docusate (SENOKOT-S) 8.6-50 MG tablet Take 1 tablet by mouth at bedtime as needed for mild constipation.  ? simethicone (MYLICON) 470 MG chewable tablet  Chew 125 mg by mouth 3 (three) times daily as needed ("increased gas").  ? UNABLE TO FIND Diet:Regular  ? venlafaxine XR (EFFEXOR-XR) 150 MG 24 hr capsule Take 150 mg by mouth daily with breakfast.  ? Vibegron (GEMTESA) 75 MG TABS Take 1 tablet by mouth daily as needed.  ? [DISCONTINUED] lisinopril (ZESTRIL) 40 MG tablet Take 1 tablet (40 mg total) by mouth daily.  ? ?No facility-administered encounter medications on file as of 08/24/2021.  ? ? ? ?SIGNIFICANT DIAGNOSTIC EXAMS ? ?NO NEW EXAMS.  ? ? ?LABS REVIEWED PREVIOUS   ? ?08-14-20: glucose 110; bun 33; creat 1.00 ;k+ 2.5 na++ 140 ?08-15-20: glucose 114; bun 31; creat  0.84; k+ 3.1; na++ 142 ?08-21-20: k+ 3.5 ?10-04-20: albumin 3.2  ?10-19-20: PSA 12.25 (13.05)  ?12-21-20: wbc 4.6; hgb 13.4 hct 40.9 mcv 86.1 plt 223; glucose 104; bun 22; creat 0.89; k+ 3.0; na++ 143; ca 9.3 GFR>60; liver normal albumin 3.3; chol 128; ldl 71; trig 85 hdl 40   ?12-25-20: k+ 3.2 ?01-01-21: k+ 3.2 ?01-08-21: k+ 4.5 ?01-19-21: glucose 108; bun 20; creat 0.96; k+ 3.6; na++ 139; ca 9.0 GFR>60 ?02-05-21: glucose 109; bun 25; creat 1.05; k+ 4.5; na++ 141; ca 9.5; GFR>60.  ?03-13-21: wbc 7.3; hgb 14.0; hct 42.5; mcv 85.5 plt 212; glucose 126; bun 24; creat 1.03 ;k+ 3.9; na++ 139; ca 9.6; GFR>60 ?03-15-21: hgb a1c 5.7 ?03-20-21: wbc 20.3; hgb 14.6; hct 44.4; mcv 86.7 plt 229; glcusoe 201; bun 26; creat 1.44; k+ 4.2; na++ 138; ca 9.2; GFR 46  ?03-22-21: aldosterone 7.6; cortisol AM 19.5;  ?03-26-21: wbc 6.3; hgb 12.4; hct 37.9; mcv 87.5 plt 230; glucose 117; bun 28; creat 1.13; k+ 4.4; na++ 138; ca 9.2; GFR>60  ?04-23-21: PSA 0.6; testosterone: <3 ?08-10-21: PSA 0.17  testosterone <3 ?08-16-21: wbc 6.0; hgb 12.0; hct 37.1; mcv 89.0 plt 181; glucose 98; bun 40; creat 1.46; k+ 5.7; na++ 138; ca 9.3 GFR>46 protein 5.8; albumin 3.4; tsh 0.620 ? ?TODAY ? ?08-24-21: glucose 104; bun 46; creat 2.07

## 2021-08-25 LAB — T3, FREE: T3, Free: 2.6 pg/mL (ref 2.0–4.4)

## 2021-08-27 ENCOUNTER — Other Ambulatory Visit: Payer: Self-pay

## 2021-08-27 ENCOUNTER — Encounter: Payer: Self-pay | Admitting: Urology

## 2021-08-27 ENCOUNTER — Ambulatory Visit (INDEPENDENT_AMBULATORY_CARE_PROVIDER_SITE_OTHER): Payer: Medicare Other | Admitting: Urology

## 2021-08-27 VITALS — BP 160/76 | HR 54

## 2021-08-27 DIAGNOSIS — C61 Malignant neoplasm of prostate: Secondary | ICD-10-CM

## 2021-08-27 DIAGNOSIS — R350 Frequency of micturition: Secondary | ICD-10-CM

## 2021-08-27 DIAGNOSIS — F19951 Other psychoactive substance use, unspecified with psychoactive substance-induced psychotic disorder with hallucinations: Secondary | ICD-10-CM | POA: Insufficient documentation

## 2021-08-27 NOTE — Progress Notes (Signed)
? ?08/27/2021 ?10:53 AM  ? ?Jeffrey Sherman ?1930/03/25 ?[id number] ? ?Referring provider: Gerlene Fee, NP ?[facility] ?[place],  [place] [address] ? ?No chief complaint on file. ? ? ?HPI: ? ? ?F/u - ?  ?1) BPH with LUTS - urinary frequency despite taking both alfuzosin and terazosin. Off alfuzosin due to dizziness. He does c/o about his urinary frequency.  Pt denies any recent UTIs, significant urinary incontinence, gross hematuria, or blood in his stool. AUASS is 15. Prostate was about 40 grams on CT in 2021. PVR = 100 ml.  ?  ?Tried Myrbetriq 02/22. Not a lot of improvement. Prostate 27 grams on [place] Nov 2022.  Cystoscopy Nov 2022 with obstruction from lateral lobe hypertrophy.  Moderate bladder trabeculation. ?  ?He continues to complain of urinary frequency especially at night.  He can go 3 to 4 hours in the day and only 1 to 2 hours at night. Trial of Gemtesa in Nov 2022. He still has some frequency and UUI.  ?  ?  ?2) PCa - dx in 2002 with Dr. Serita Butcher with PSA 22.7, GG1 and GG2 (Gleason 3+4=7, 3+3=6). No definitive therapy. On ADT until 2013 when he started IADT. He stopped ADT after a 2015 dose and PSA rose to 28.7 in March 2021 and 24 in May 2021. He received a 4 month Lupron in May 2021 and PSA did not respond - it remained stable at 24.4 in Aug 2021 with T 32. He was adamant about not restarting ADT until May 2022 when he wanted to restart due to LUTS. His PSA is lower at 16 in Feb 2022 and 12 around May 2022.  ?  ?Primary treatment: ?None ?  ?Current Tx: ?IADT  ?  ?Staging: ?May 2019: Bone/CT scan both showed no evidence of metastatic disease.  ?Dec 2021 CT no evidence of mets - PSA 24  ?  ?In the past ADT has helped his LUTS. He was given Eligard 45 mg May 2022 at PSA 12.25. His Nov 2022 PSA down to 0.6 with T < 3 and now Mar 2023 PSA 0.17 and T < 3.     ?  ?  ?Today, seen for the above.  ? ? ?PMH: ?Past Medical History:  ?Diagnosis Date  ? Anxiety   ? Cancer [facility])   ? prostate  ? Chronic chest  wall pain   ? Essential hypertension, benign 06/20/2016  ? GAD (generalized anxiety disorder)   ? GERD (gastroesophageal reflux disease)   ? H/O echocardiogram 2016  ? normal  ? Heart murmur   ? "leaking heart valve"  ? High cholesterol 06/20/2016  ? HOH (hard of hearing)   ? left  ? Hx of falling   ? Interstitial pulmonary disease ([place])   ? Lower GI bleed   ? MDD (major depressive disorder)   ? Murmur, cardiac   ? Poor historian   ? PUD (peptic ulcer disease)   ? RLS (restless legs syndrome)   ? Shortness of breath   ? Wrist fracture 12/16/2011  ? ? ?Surgical History: ?Past Surgical History:  ?Procedure Laterality Date  ? CHOLECYSTECTOMY N/A 06/28/2020  ? Procedure: LAPAROSCOPIC CHOLECYSTECTOMY;  Surgeon: [name], MD;  Location: [facility];  Service: General;  Laterality: N/A;  pt in [place]  ? COLONOSCOPY  2005  ? Rehman: Sigmoid colon diverticulosis, moderate sized external hemorrhoids. A few focal areas of mucosal erythema felt to be nonspecific.  ? ESOPHAGOGASTRODUODENOSCOPY    ? ESOPHAGOGASTRODUODENOSCOPY  N/A 07/18/2016  ? Rehman: normal exam except duodenal scar. h.pylori serologies negative  ? FLEXIBLE SIGMOIDOSCOPY N/A 06/11/2020  ? Procedure: FLEXIBLE SIGMOIDOSCOPY;  Surgeon: Eloise Harman, DO;  [facility];  Service: Endoscopy;  Laterality: N/A;  ? HARDWARE REMOVAL Right 03/20/2021  ? Procedure: HARDWARE REMOVAL right hip;  Surgeon: Carole Civil, MD;  Location: [facility];  Service: Orthopedics;  Laterality: Right;  ? HIP PINNING,CANNULATED Right 03/10/2020  ? Procedure: INTERNAL FIXATION RIGHT HIP;  Surgeon: Carole Civil, MD;  Location: [facility];  Service: Orthopedics;  Laterality: Right;  ? ORIF WRIST FRACTURE  12/19/2011  ? Procedure: OPEN REDUCTION INTERNAL FIXATION (ORIF) WRIST FRACTURE;  Surgeon: Carole Civil, MD;  Location: [facility];  Service: Orthopedics;  Laterality: Right;  ? prostate cancer    ? Diagnosed in the 90s  ? TONSILLECTOMY    ? ? ?Home Medications:   ?Allergies as of 08/27/2021   ? ?   Reactions  ? Hctz [hydrochlorothiazide]   ? hypokalemia  ? ?  ? ?  ?Medication List  ?  ? ? Notice   ?This visit is during an admission. Changes to the med list made in this visit will be reflected in the After Visit Summary of the admission. ?  ? ? ?Allergies:  ?Allergies  ?Allergen Reactions  ? Hctz [Hydrochlorothiazide]   ?  hypokalemia  ? ? ?Family History: ?Family History  ?Problem Relation Age of Onset  ? Cancer Other   ? Heart attack Mother   ? Cancer Brother   ? Heart attack Brother   ? Heart disease Sister   ?     by pass  ? ? ?Social History:  reports that he has never smoked. He has never used smokeless tobacco. He reports that he does not drink alcohol and does not use drugs. ? ? ?Physical Exam: ?BP (!) 160/76   Pulse (!) 54   ?Constitutional:  Alert and oriented, No acute distress. ?HEENT: [place] AT, moist mucus membranes.  Trachea midline, no masses. ?Cardiovascular: No clubbing, cyanosis, or edema. ?Respiratory: Normal respiratory effort, no increased work of breathing. ?GI: Abdomen is soft, nontender, nondistended, no abdominal masses ?GU: No CVA tenderness ?Skin: No rashes, bruises or suspicious lesions. ?Neurologic: Grossly intact, no focal deficits, moving all 4 extremities. ?Psychiatric: Normal mood and affect. ? ?Laboratory Data: ?Lab Results  ?Component Value Date  ? WBC 6.0 08/16/2021  ? HGB 12.0 (L) 08/16/2021  ? HCT 37.1 (L) 08/16/2021  ? MCV 89.0 08/16/2021  ? PLT 181 08/16/2021  ? ? ?Lab Results  ?Component Value Date  ? CREATININE 2.07 (H) 08/24/2021  ? ? ?Lab Results  ?Component Value Date  ? PSA 24.0 (H) 10/19/2019  ? PSA 28.7 (H) 08/25/2019  ? ? ?Lab Results  ?Component Value Date  ? TESTOSTERONE <3 (L) 08/10/2021  ? ? ?Lab Results  ?Component Value Date  ? HGBA1C 5.7 (H) 03/15/2021  ? ? ?Urinalysis ?   ?Component Value Date/Time  ? COLORURINE YELLOW 06/13/2020 2215  ? APPEARANCEUR Clear 04/30/2021 1021  ? LABSPEC 1.014 06/13/2020 2215  ? PHURINE 5.0  06/13/2020 2215  ? GLUCOSEU Negative 04/30/2021 1021  ? [place] NEGATIVE 06/13/2020 2215  ? BILIRUBINUR Negative 04/30/2021 1021  ? [place] NEGATIVE 06/13/2020 2215  ? PROTEINUR 1+ (A) 04/30/2021 1021  ? PROTEINUR 30 (A) 06/13/2020 2215  ? UROBILINOGEN negative (A) 10/19/2019 9211  ? UROBILINOGEN 0.2 05/08/2014 2045  ? NITRITE Negative 04/30/2021 1021  ? NITRITE NEGATIVE 06/13/2020 2215  ?  LEUKOCYTESUR Negative 04/30/2021 1021  ? LEUKOCYTESUR NEGATIVE 06/13/2020 2215  ? ? ?Lab Results  ?Component Value Date  ? LABMICR See below: 04/30/2021  ? WBCUA 0-5 04/30/2021  ? LABEPIT 0-10 04/30/2021  ? MUCUS Present 04/30/2021  ? BACTERIA Few 04/30/2021  ? ? ?Pertinent Imaging: ?Results for orders placed in visit on 10/09/00 ? ?DG Abd 1 View ? ?Narrative ?FINDINGS ?CLINICAL DATA:  88 YEAR OLD MALE WITH RENAL CALCULI, ABDOMINAL PAIN. ?SINGLE VIEW ABDOMEN - 10/09/00: ?FINDINGS:  A SINGLE FRONTAL VIEW OF THE ABDOMEN AND PELVIS DEMONSTRATES UNREMARKABLE BOWEL GAS ?PATTERN.  DEFINITE CALCIFICATIONS ARE NOTED OVERLYING THE RENAL SHADOWS OR EXPECTED COURSE OF THE ?URETERS BUT PLEASE NOTE THAT STOOL IN THE COLONIC GAS DECREASE SENSITIVITY, ESPECIALLY OVER THE ?RENAL SHADOWS.   MINIMAL DEGENERATIVE CHANGE IN THE LOWER LUMBAR SPINE ARE NOTED. ?IMPRESSION ?NO DEFINITE EVIDENCE OF CALCIFICATIONS OVERLYING THE ABDOMEN/PELVIS, BUT SOMEWHAT LIMITED ?SENSITIVITY OVER THE RENAL SHADOWS SECONDARY TO OVERLYING COLONIC BOWEL GAS/STOOL. ? ?No results found for this or any previous visit. ? ?No results found for this or any previous visit. ? ?No results found for this or any previous visit. ? ?Results for orders placed during the hospital encounter of 03/09/20 ? ?US RENAL ? ?Narrative ?CLINICAL DATA:  Acute kidney injury ? ?EXAM: ?RENAL / URINARY TRACT ULTRASOUND COMPLETE ? ?COMPARISON:  Abdominal ultrasound 04/21/2019 ? ?FINDINGS: ?Right Kidney: ? ?Renal measurements: 12.5 x 5.0 x 4.3 cm = volume: 141 mL. ?Echogenicity within normal limits.  Multiple cysts, largest measuring ?6.6 cm. No solid mass. No hydronephrosis. ? ?Left Kidney: ? ?Renal measurements: 11.8 x 5.6 x 5.5 cm = volume: 189 mL. ?Echogenicity within normal limits. Large cyst measuring 6.1 c

## 2021-08-28 ENCOUNTER — Encounter: Payer: Self-pay | Admitting: Adult Health

## 2021-08-28 ENCOUNTER — Non-Acute Institutional Stay (SKILLED_NURSING_FACILITY): Payer: Medicare Other | Admitting: Adult Health

## 2021-08-28 ENCOUNTER — Other Ambulatory Visit: Payer: Medicare Other

## 2021-08-28 DIAGNOSIS — F0390 Unspecified dementia without behavioral disturbance: Secondary | ICD-10-CM | POA: Diagnosis not present

## 2021-08-28 DIAGNOSIS — R443 Hallucinations, unspecified: Secondary | ICD-10-CM | POA: Diagnosis not present

## 2021-08-28 NOTE — Progress Notes (Signed)
? ?Location:  [place] [facility] Room Number: 156 ?Place of Service:  SNF (31) ? ? ?CODE STATUS: dnr  ? ?Allergies  ?Allergen Reactions  ? Hctz [Hydrochlorothiazide]   ?  hypokalemia  ? Xanax [Alprazolam] Other (See Comments)  ?  Causes hallucinations   ? ? ?Chief Complaint  ?Patient presents with  ? Acute Visit  ?  Hallucinations   ? ? ?HPI: ? ?He continues to have hallucinations of cats and dogs. He has saved food for them in the recent past. The hallucinations do not seen to bother him. The hallucinations are worse at bedtime. He is complaining of constipation; however; he is having bowel movements daily.  ? ?Past Medical History:  ?Diagnosis Date  ? Anxiety   ? Cancer [facility])   ? prostate  ? Chronic chest wall pain   ? Essential hypertension, benign 06/20/2016  ? GAD (generalized anxiety disorder)   ? GERD (gastroesophageal reflux disease)   ? H/O echocardiogram 2016  ? normal  ? Heart murmur   ? "leaking heart valve"  ? High cholesterol 06/20/2016  ? HOH (hard of hearing)   ? left  ? Hx of falling   ? Interstitial pulmonary disease ([place])   ? Lower GI bleed   ? MDD (major depressive disorder)   ? Murmur, cardiac   ? Poor historian   ? PUD (peptic ulcer disease)   ? RLS (restless legs syndrome)   ? Shortness of breath   ? Wrist fracture 12/16/2011  ? ? ?Past Surgical History:  ?Procedure Laterality Date  ? CHOLECYSTECTOMY N/A 06/28/2020  ? Procedure: LAPAROSCOPIC CHOLECYSTECTOMY;  Surgeon: [name], MD;  Location: [facility];  Service: General;  Laterality: N/A;  pt in [place]  ? COLONOSCOPY  2005  ? Rehman: Sigmoid colon diverticulosis, moderate sized external hemorrhoids. A few focal areas of mucosal erythema felt to be nonspecific.  ? ESOPHAGOGASTRODUODENOSCOPY    ? ESOPHAGOGASTRODUODENOSCOPY N/A 07/18/2016  ? Rehman: normal exam except duodenal scar. h.pylori serologies negative  ? FLEXIBLE SIGMOIDOSCOPY N/A 06/11/2020  ? Procedure: FLEXIBLE SIGMOIDOSCOPY;  Surgeon: Eloise Harman, DO;   [facility];  Service: Endoscopy;  Laterality: N/A;  ? HARDWARE REMOVAL Right 03/20/2021  ? Procedure: HARDWARE REMOVAL right hip;  Surgeon: Carole Civil, MD;  Location: [facility];  Service: Orthopedics;  Laterality: Right;  ? HIP PINNING,CANNULATED Right 03/10/2020  ? Procedure: INTERNAL FIXATION RIGHT HIP;  Surgeon: Carole Civil, MD;  Location: [facility];  Service: Orthopedics;  Laterality: Right;  ? ORIF WRIST FRACTURE  12/19/2011  ? Procedure: OPEN REDUCTION INTERNAL FIXATION (ORIF) WRIST FRACTURE;  Surgeon: Carole Civil, MD;  Location: [facility];  Service: Orthopedics;  Laterality: Right;  ? prostate cancer    ? Diagnosed in the 90s  ? TONSILLECTOMY    ? ? ?Social History  ? ?Socioeconomic History  ? Marital status: Widowed  ?  Spouse name: Not on file  ? Number of children: Not on file  ? Years of education: Not on file  ? Highest education level: Not on file  ?Occupational History  ? Occupation: retired  ?  Employer: RETIRED  ?Tobacco Use  ? Smoking status: Never  ? Smokeless tobacco: Never  ?Vaping Use  ? Vaping Use: Never used  ?Substance and Sexual Activity  ? Alcohol use: No  ? Drug use: No  ? Sexual activity: Not Currently  ?Other Topics Concern  ? Not on file  ?Social History Narrative  ?  Is long term resident of [facility]   ? ?Social Determinants of Health  ? ?Financial Resource Strain: Not on file  ?Food Insecurity: Not on file  ?Transportation Needs: Not on file  ?Physical Activity: Not on file  ?Stress: Not on file  ?Social Connections: Not on file  ?Intimate Partner Violence: Not on file  ? ?Family History  ?Problem Relation Age of Onset  ? Cancer Other   ? Heart attack Mother   ? Cancer Brother   ? Heart attack Brother   ? Heart disease Sister   ?     by pass  ? ? ? ? ?VITAL SIGNS ?BP (!) 141/70   Pulse (!) 58   Temp 98 ?F (36.7 ?C)   Resp 18   Ht '5\' 9"'$  (1.753 m)   Wt 179 lb (81.2 kg)   BMI 26.43 kg/m?  ? ?Outpatient Encounter Medications as of 08/28/2021  ?Medication Sig  ?  acetaminophen (TYLENOL) 500 MG tablet Take 500 mg by mouth every 6 (six) hours. Max '3000mg'$  in 24 hour period  ? alfuzosin (UROXATRAL) 10 MG 24 hr tablet Take 10 mg by mouth daily. 6 pm  ? Ascorbic Acid (VITAMIN C) 1000 MG tablet Take 1,000 mg by mouth daily.  ? Balsam Engineer, materials (VENELEX) OINT Apply 1 application topically every 8 (eight) hours as needed (Apply to bilateral buttocks & sacral area qshift for blanchable erythema.).  ? calcium carbonate (TUMS - DOSED IN MG ELEMENTAL CALCIUM) 500 MG chewable tablet Chew 1 tablet by mouth 2 (two) times daily as needed for indigestion or heartburn.   ? diltiazem (CARDIZEM CD) 360 MG 24 hr capsule Take 1 capsule (360 mg total) by mouth daily.  ? Flax Oil-Fish Oil-Borage Oil (FISH OIL-FLAX OIL-BORAGE OIL) CAPS Take 1 capsule by mouth daily.  ? isosorbide mononitrate (IMDUR) 30 MG 24 hr tablet Take 1 tablet (30 mg total) by mouth daily.  ? lisinopril (ZESTRIL) 20 MG tablet Take 20 mg by mouth daily.  ? loperamide (IMODIUM A-D) 2 MG tablet Take 2 mg by mouth as needed (after each stool. (do not exceed more than 4 tabs in 24 hours)).  ? loratadine (CLARITIN) 10 MG tablet Take 10 mg by mouth daily.  ? Melatonin 10 MG TABS Take 10 mg by mouth at bedtime.  ? meloxicam (MOBIC) 7.5 MG tablet Take 7.5 mg by mouth daily. give for left hip pain  ? memantine (NAMENDA) 10 MG tablet Take 10 mg by mouth 2 (two) times daily.  ? methocarbamol (ROBAXIN) 500 MG tablet Take 500 mg by mouth daily.  ? metoprolol tartrate (LOPRESSOR) 25 MG tablet Take 1 tablet (25 mg total) by mouth 2 (two) times daily.  ? omeprazole (PRILOSEC) 20 MG capsule Take 1 capsule (20 mg total) by mouth daily before breakfast.  ? ondansetron (ZOFRAN) 8 MG tablet Take 8 mg by mouth as needed for nausea.  ? OXYGEN Inhale 2 L into the lungs as needed (To keep O2 ststa > 90 %).  ? polyethylene glycol (MIRALAX / GLYCOLAX) 17 g packet Take 17 g by mouth daily. On Tuesday, Thursday, and Saturday  ? potassium chloride SA  (KLOR-CON) 20 MEQ tablet Take 20 mEq by mouth 2 (two) times daily.  ? QUEtiapine (SEROQUEL) 25 MG tablet Take 12.5 mg by mouth 2 (two) times daily.  ? rOPINIRole (REQUIP) 0.25 MG tablet Take 0.25 mg by mouth at bedtime.  ? senna-docusate (SENOKOT-S) 8.6-50 MG tablet Take 1 tablet by mouth at bedtime as needed  for mild constipation.  ? simethicone (MYLICON) 970 MG chewable tablet Chew 125 mg by mouth 3 (three) times daily as needed ("increased gas").  ? UNABLE TO FIND Diet:Regular  ? venlafaxine XR (EFFEXOR-XR) 150 MG 24 hr capsule Take 150 mg by mouth daily with breakfast.  ? Vibegron (GEMTESA) 75 MG TABS Take 1 tablet by mouth daily as needed.  ? [DISCONTINUED] ipratropium-albuterol (DUONEB) 0.5-2.5 (3) MG/3ML SOLN Take 3 mLs by nebulization every 6 (six) hours as needed (shortness of breath or wheezing).  ? ?No facility-administered encounter medications on file as of 08/28/2021.  ? ? ? ?SIGNIFICANT DIAGNOSTIC EXAMS ? ? ?NO NEW EXAMS.  ? ? ?LABS REVIEWED PREVIOUS   ? ?08-14-20: glucose 110; bun 33; creat 1.00 ;k+ 2.5 na++ 140 ?08-15-20: glucose 114; bun 31; creat  0.84; k+ 3.1; na++ 142 ?08-21-20: k+ 3.5 ?10-04-20: albumin 3.2  ?10-19-20: PSA 12.25 (13.05)  ?12-21-20: wbc 4.6; hgb 13.4 hct 40.9 mcv 86.1 plt 223; glucose 104; bun 22; creat 0.89; k+ 3.0; na++ 143; ca 9.3 GFR>60; liver normal albumin 3.3; chol 128; ldl 71; trig 85 hdl 40   ?12-25-20: k+ 3.2 ?01-01-21: k+ 3.2 ?01-08-21: k+ 4.5 ?01-19-21: glucose 108; bun 20; creat 0.96; k+ 3.6; na++ 139; ca 9.0 GFR>60 ?02-05-21: glucose 109; bun 25; creat 1.05; k+ 4.5; na++ 141; ca 9.5; GFR>60.  ?03-13-21: wbc 7.3; hgb 14.0; hct 42.5; mcv 85.5 plt 212; glucose 126; bun 24; creat 1.03 ;k+ 3.9; na++ 139; ca 9.6; GFR>60 ?03-15-21: hgb a1c 5.7 ?03-20-21: wbc 20.3; hgb 14.6; hct 44.4; mcv 86.7 plt 229; glcusoe 201; bun 26; creat 1.44; k+ 4.2; na++ 138; ca 9.2; GFR 46  ?03-22-21: aldosterone 7.6; cortisol AM 19.5;  ?03-26-21: wbc 6.3; hgb 12.4; hct 37.9; mcv 87.5 plt 230; glucose 117; bun  28; creat 1.13; k+ 4.4; na++ 138; ca 9.2; GFR>60  ?04-23-21: PSA 0.6; testosterone: <3 ?08-10-21: PSA 0.17 testosterone <3 ?08-16-21: wbc 6.0; hgb 12.0; hct 37.1; mcv 89.0 plt 181; glucose 98; bun 40; creat 1.46; k+

## 2021-08-30 ENCOUNTER — Other Ambulatory Visit (HOSPITAL_COMMUNITY)
Admission: RE | Admit: 2021-08-30 | Discharge: 2021-08-30 | Disposition: A | Discharge status: Home / Self Care | Payer: Medicare Other | Source: Skilled Nursing Facility | Attending: Adult Health | Admitting: Adult Health

## 2021-08-30 ENCOUNTER — Encounter: Payer: Self-pay | Admitting: Adult Health

## 2021-08-30 ENCOUNTER — Non-Acute Institutional Stay (SKILLED_NURSING_FACILITY): Payer: Medicare Other | Admitting: Adult Health

## 2021-08-30 DIAGNOSIS — I129 Hypertensive chronic kidney disease with stage 1 through stage 4 chronic kidney disease, or unspecified chronic kidney disease: Secondary | ICD-10-CM | POA: Diagnosis not present

## 2021-08-30 DIAGNOSIS — E538 Deficiency of other specified B group vitamins: Secondary | ICD-10-CM

## 2021-08-30 LAB — VITAMIN B12: Vitamin B-12: 205 pg/mL (ref 180–914)

## 2021-08-30 NOTE — Progress Notes (Signed)
?Location:  [place] [facility] Room Number: 156-D ?Place of Service:  SNF (31) ? ? ?CODE STATUS: DNR ? ?Allergies  ?Allergen Reactions  ? Hctz [Hydrochlorothiazide]   ?  hypokalemia  ? Xanax [Alprazolam] Other (See Comments)  ?  Causes hallucinations   ? ? ?Chief Complaint  ?Patient presents with  ? Acute Visit  ?  Labs follow-up  ? ? ?HPI: ? ?He still continues to have hallucinations about seeing cats and dogs. They are no causing him any distress. He is due for MRI of brain in early April. His vitamin b 12 level is 205. He denies any pain, numbness or tingling.  ? ?Past Medical History:  ?Diagnosis Date  ? Anxiety   ? Cancer [facility])   ? prostate  ? Chronic chest wall pain   ? Essential hypertension, benign 06/20/2016  ? GAD (generalized anxiety disorder)   ? GERD (gastroesophageal reflux disease)   ? H/O echocardiogram 2016  ? normal  ? Heart murmur   ? "leaking heart valve"  ? High cholesterol 06/20/2016  ? HOH (hard of hearing)   ? left  ? Hx of falling   ? Interstitial pulmonary disease ([place])   ? Lower GI bleed   ? MDD (major depressive disorder)   ? Murmur, cardiac   ? Poor historian   ? PUD (peptic ulcer disease)   ? RLS (restless legs syndrome)   ? Shortness of breath   ? Wrist fracture 12/16/2011  ? ? ?Past Surgical History:  ?Procedure Laterality Date  ? CHOLECYSTECTOMY N/A 06/28/2020  ? Procedure: LAPAROSCOPIC CHOLECYSTECTOMY;  Surgeon: [name], MD;  Location: [facility];  Service: General;  Laterality: N/A;  pt in [place]  ? COLONOSCOPY  2005  ? Rehman: Sigmoid colon diverticulosis, moderate sized external hemorrhoids. A few focal areas of mucosal erythema felt to be nonspecific.  ? ESOPHAGOGASTRODUODENOSCOPY    ? ESOPHAGOGASTRODUODENOSCOPY N/A 07/18/2016  ? Rehman: normal exam except duodenal scar. h.pylori serologies negative  ? FLEXIBLE SIGMOIDOSCOPY N/A 06/11/2020  ? Procedure: FLEXIBLE SIGMOIDOSCOPY;  Surgeon: Eloise Harman, DO;  [facility];  Service: Endoscopy;   Laterality: N/A;  ? HARDWARE REMOVAL Right 03/20/2021  ? Procedure: HARDWARE REMOVAL right hip;  Surgeon: Carole Civil, MD;  Location: [facility];  Service: Orthopedics;  Laterality: Right;  ? HIP PINNING,CANNULATED Right 03/10/2020  ? Procedure: INTERNAL FIXATION RIGHT HIP;  Surgeon: Carole Civil, MD;  Location: [facility];  Service: Orthopedics;  Laterality: Right;  ? ORIF WRIST FRACTURE  12/19/2011  ? Procedure: OPEN REDUCTION INTERNAL FIXATION (ORIF) WRIST FRACTURE;  Surgeon: Carole Civil, MD;  Location: [facility];  Service: Orthopedics;  Laterality: Right;  ? prostate cancer    ? Diagnosed in the 90s  ? TONSILLECTOMY    ? ? ?Social History  ? ?Socioeconomic History  ? Marital status: Widowed  ?  Spouse name: Not on file  ? Number of children: Not on file  ? Years of education: Not on file  ? Highest education level: Not on file  ?Occupational History  ? Occupation: retired  ?  Employer: RETIRED  ?Tobacco Use  ? Smoking status: Never  ? Smokeless tobacco: Never  ?Vaping Use  ? Vaping Use: Never used  ?Substance and Sexual Activity  ? Alcohol use: No  ? Drug use: No  ? Sexual activity: Not Currently  ?Other Topics Concern  ? Not on file  ?Social History Narrative  ? Is long term resident of  [facility]   ? ?Social Determinants of Health  ? ?Financial Resource Strain: Not on file  ?Food Insecurity: Not on file  ?Transportation Needs: Not on file  ?Physical Activity: Not on file  ?Stress: Not on file  ?Social Connections: Not on file  ?Intimate Partner Violence: Not on file  ? ?Family History  ?Problem Relation Age of Onset  ? Cancer Other   ? Heart attack Mother   ? Cancer Brother   ? Heart attack Brother   ? Heart disease Sister   ?     by pass  ? ? ? ? ?VITAL SIGNS ?BP (!) 141/70   Pulse (!) 58   Temp 98 ?F (36.7 ?C)   Resp 20   Ht '5\' 9"'$  (1.753 m)   Wt 179 lb (81.2 kg)   SpO2 97%   BMI 26.43 kg/m?  ? ?Outpatient Encounter Medications as of 08/30/2021  ?Medication Sig  ? acetaminophen (TYLENOL) 500 MG tablet  Take 500 mg by mouth every 6 (six) hours. Max '3000mg'$  in 24 hour period  ? alfuzosin (UROXATRAL) 10 MG 24 hr tablet Take 10 mg by mouth daily. 6 pm  ? Ascorbic Acid (VITAMIN C) 1000 MG tablet Take 1,000 mg by mouth daily.  ? Balsam Engineer, materials (VENELEX) OINT Apply 1 application topically every 8 (eight) hours as needed (Apply to bilateral buttocks & sacral area qshift for blanchable erythema.).  ? calcium carbonate (TUMS - DOSED IN MG ELEMENTAL CALCIUM) 500 MG chewable tablet Chew 1 tablet by mouth 2 (two) times daily as needed for indigestion or heartburn.   ? diltiazem (CARDIZEM CD) 360 MG 24 hr capsule Take 1 capsule (360 mg total) by mouth daily.  ? Flax Oil-Fish Oil-Borage Oil (FISH OIL-FLAX OIL-BORAGE OIL) CAPS Take 1 capsule by mouth daily.  ? isosorbide mononitrate (IMDUR) 30 MG 24 hr tablet Take 1 tablet (30 mg total) by mouth daily.  ? loperamide (IMODIUM A-D) 2 MG tablet Take 2 mg by mouth as needed (after each stool. (do not exceed more than 4 tabs in 24 hours)).  ? loratadine (CLARITIN) 10 MG tablet Take 10 mg by mouth daily.  ? Melatonin 10 MG TABS Take 10 mg by mouth at bedtime.  ? meloxicam (MOBIC) 7.5 MG tablet Take 7.5 mg by mouth daily. give for left hip pain  ? memantine (NAMENDA) 10 MG tablet Take 10 mg by mouth 2 (two) times daily.  ? methocarbamol (ROBAXIN) 500 MG tablet Take 500 mg by mouth daily.  ? metoprolol tartrate (LOPRESSOR) 25 MG tablet Take 1 tablet (25 mg total) by mouth 2 (two) times daily.  ? omeprazole (PRILOSEC) 20 MG capsule Take 1 capsule (20 mg total) by mouth daily before breakfast.  ? ondansetron (ZOFRAN) 8 MG tablet Take 8 mg by mouth as needed for nausea.  ? OXYGEN Inhale 2 L into the lungs as needed (To keep O2 ststa > 90 %).  ? polyethylene glycol (MIRALAX / GLYCOLAX) 17 g packet Take 17 g by mouth daily. On Tuesday, Thursday, and Saturday  ? potassium chloride SA (KLOR-CON) 20 MEQ tablet Take 20 mEq by mouth 2 (two) times daily.  ? QUEtiapine (SEROQUEL) 25 MG tablet  Take 12.5 mg by mouth 2 (two) times daily.  ? rOPINIRole (REQUIP) 0.25 MG tablet Take 0.25 mg by mouth at bedtime.  ? senna-docusate (SENOKOT-S) 8.6-50 MG tablet Take 1 tablet by mouth at bedtime as needed for mild constipation.  ? simethicone (MYLICON) 500 MG chewable tablet Chew 125 mg  by mouth 3 (three) times daily as needed ("increased gas").  ? UNABLE TO FIND Diet:Regular  ? venlafaxine XR (EFFEXOR-XR) 150 MG 24 hr capsule Take 150 mg by mouth daily with breakfast.  ? Vibegron (GEMTESA) 75 MG TABS Take 1 tablet by mouth daily as needed.  ? [DISCONTINUED] lisinopril (ZESTRIL) 20 MG tablet Take 20 mg by mouth daily.  ? ?No facility-administered encounter medications on file as of 08/30/2021.  ? ? ? ?SIGNIFICANT DIAGNOSTIC EXAMS ? ?NO NEW EXAMS.  ? ? ?LABS REVIEWED PREVIOUS   ? ?08-14-20: glucose 110; bun 33; creat 1.00 ;k+ 2.5 na++ 140 ?08-15-20: glucose 114; bun 31; creat  0.84; k+ 3.1; na++ 142 ?08-21-20: k+ 3.5 ?10-04-20: albumin 3.2  ?10-19-20: PSA 12.25 (13.05)  ?12-21-20: wbc 4.6; hgb 13.4 hct 40.9 mcv 86.1 plt 223; glucose 104; bun 22; creat 0.89; k+ 3.0; na++ 143; ca 9.3 GFR>60; liver normal albumin 3.3; chol 128; ldl 71; trig 85 hdl 40   ?12-25-20: k+ 3.2 ?01-01-21: k+ 3.2 ?01-08-21: k+ 4.5 ?01-19-21: glucose 108; bun 20; creat 0.96; k+ 3.6; na++ 139; ca 9.0 GFR>60 ?02-05-21: glucose 109; bun 25; creat 1.05; k+ 4.5; na++ 141; ca 9.5; GFR>60.  ?03-13-21: wbc 7.3; hgb 14.0; hct 42.5; mcv 85.5 plt 212; glucose 126; bun 24; creat 1.03 ;k+ 3.9; na++ 139; ca 9.6; GFR>60 ?03-15-21: hgb a1c 5.7 ?03-20-21: wbc 20.3; hgb 14.6; hct 44.4; mcv 86.7 plt 229; glcusoe 201; bun 26; creat 1.44; k+ 4.2; na++ 138; ca 9.2; GFR 46  ?03-22-21: aldosterone 7.6; cortisol AM 19.5;  ?03-26-21: wbc 6.3; hgb 12.4; hct 37.9; mcv 87.5 plt 230; glucose 117; bun 28; creat 1.13; k+ 4.4; na++ 138; ca 9.2; GFR>60  ?04-23-21: PSA 0.6; testosterone: <3 ?08-10-21: PSA 0.17 testosterone <3 ?08-16-21: wbc 6.0; hgb 12.0; hct 37.1; mcv 89.0 plt 181; glucose 98; bun  40; creat 1.46; k+ 5.7; na++ 138; ca 9.3 GFR>46 protein 5.8; albumin 3.4; tsh 0.620 ?08-24-21: glucose 104; bun 46; creat 2.07; k+ 4.7; na++ 141; ca 8.5; GFR 30  ? ?TODAY ? ?08-30-21: vitamin B12  205  ? ? ?

## 2021-08-31 DIAGNOSIS — F331 Major depressive disorder, recurrent, moderate: Secondary | ICD-10-CM | POA: Diagnosis not present

## 2021-08-31 DIAGNOSIS — R443 Hallucinations, unspecified: Secondary | ICD-10-CM

## 2021-08-31 HISTORY — DX: Hallucinations, unspecified: R44.3

## 2021-09-03 DIAGNOSIS — E538 Deficiency of other specified B group vitamins: Secondary | ICD-10-CM | POA: Insufficient documentation

## 2021-09-03 HISTORY — DX: Deficiency of other specified B group vitamins: E53.8

## 2021-09-04 DIAGNOSIS — F331 Major depressive disorder, recurrent, moderate: Secondary | ICD-10-CM | POA: Diagnosis not present

## 2021-09-05 DIAGNOSIS — F411 Generalized anxiety disorder: Secondary | ICD-10-CM | POA: Diagnosis not present

## 2021-09-05 DIAGNOSIS — F321 Major depressive disorder, single episode, moderate: Secondary | ICD-10-CM | POA: Diagnosis not present

## 2021-09-05 DIAGNOSIS — F319 Bipolar disorder, unspecified: Secondary | ICD-10-CM | POA: Diagnosis not present

## 2021-09-05 DIAGNOSIS — G3184 Mild cognitive impairment, so stated: Secondary | ICD-10-CM | POA: Diagnosis not present

## 2021-09-10 ENCOUNTER — Encounter: Payer: Self-pay | Admitting: Adult Health

## 2021-09-10 ENCOUNTER — Other Ambulatory Visit (HOSPITAL_COMMUNITY)
Admission: RE | Admit: 2021-09-10 | Discharge: 2021-09-10 | Disposition: A | Discharge status: Home / Self Care | Payer: Medicare Other | Source: Skilled Nursing Facility | Attending: Adult Health | Admitting: Adult Health

## 2021-09-10 ENCOUNTER — Non-Acute Institutional Stay (SKILLED_NURSING_FACILITY): Payer: Medicare Other | Admitting: Adult Health

## 2021-09-10 DIAGNOSIS — U071 COVID-19: Secondary | ICD-10-CM

## 2021-09-10 DIAGNOSIS — R7982 Elevated C-reactive protein (CRP): Secondary | ICD-10-CM | POA: Diagnosis not present

## 2021-09-10 DIAGNOSIS — R918 Other nonspecific abnormal finding of lung field: Secondary | ICD-10-CM | POA: Diagnosis not present

## 2021-09-10 LAB — CBC WITH DIFFERENTIAL/PLATELET
Abs Immature Granulocytes: 0.05 10*3/uL (ref 0.00–0.07)
Basophils Absolute: 0 10*3/uL (ref 0.0–0.1)
Basophils Relative: 0 %
Eosinophils Absolute: 0 10*3/uL (ref 0.0–0.5)
Eosinophils Relative: 0 %
HCT: 38.5 % — ABNORMAL LOW (ref 39.0–52.0)
Hemoglobin: 12.8 g/dL — ABNORMAL LOW (ref 13.0–17.0)
Immature Granulocytes: 1 %
Lymphocytes Relative: 16 %
Lymphs Abs: 1.4 10*3/uL (ref 0.7–4.0)
MCH: 29 pg (ref 26.0–34.0)
MCHC: 33.2 g/dL (ref 30.0–36.0)
MCV: 87.3 fL (ref 80.0–100.0)
Monocytes Absolute: 1.3 10*3/uL — ABNORMAL HIGH (ref 0.1–1.0)
Monocytes Relative: 15 %
Neutro Abs: 5.8 10*3/uL (ref 1.7–7.7)
Neutrophils Relative %: 68 %
Platelets: 167 10*3/uL (ref 150–400)
RBC: 4.41 MIL/uL (ref 4.22–5.81)
RDW: 14 % (ref 11.5–15.5)
WBC: 8.5 10*3/uL (ref 4.0–10.5)
nRBC: 0 % (ref 0.0–0.2)

## 2021-09-10 LAB — IRON AND TIBC
Iron: 38 ug/dL — ABNORMAL LOW (ref 45–182)
Saturation Ratios: 15 % — ABNORMAL LOW (ref 17.9–39.5)
TIBC: 261 ug/dL (ref 250–450)
UIBC: 223 ug/dL

## 2021-09-10 LAB — BASIC METABOLIC PANEL
Anion gap: 6 (ref 5–15)
BUN: 25 mg/dL — ABNORMAL HIGH (ref 8–23)
CO2: 27 mmol/L (ref 22–32)
Calcium: 9.6 mg/dL (ref 8.9–10.3)
Chloride: 107 mmol/L (ref 98–111)
Creatinine, Ser: 1.2 mg/dL (ref 0.61–1.24)
GFR, Estimated: 58 mL/min — ABNORMAL LOW (ref >60)
Glucose, Bld: 131 mg/dL — ABNORMAL HIGH (ref 70–99)
Potassium: 3.9 mmol/L (ref 3.5–5.1)
Sodium: 140 mmol/L (ref 135–145)

## 2021-09-10 LAB — SARS CORONAVIRUS 2 BY RT PCR (HOSPITAL ORDER, PERFORMED IN ~~LOC~~ HOSPITAL LAB): SARS Coronavirus 2: POSITIVE — AB

## 2021-09-10 LAB — C-REACTIVE PROTEIN: CRP: 6.7 mg/dL — ABNORMAL HIGH (ref <1.0)

## 2021-09-10 LAB — D-DIMER, QUANTITATIVE: D-Dimer, Quant: 0.43 ug/mL-FEU (ref 0.00–0.50)

## 2021-09-10 LAB — FOLATE: Folate: 14.2 ng/mL (ref >5.9)

## 2021-09-10 NOTE — Progress Notes (Signed)
?Location:  [place] ?Nursing Home Room Number: 132-P ?Place of Service:  SNF (31) ? ? ?CODE STATUS: DNR ? ?Allergies  ?Allergen Reactions  ? Hctz [Hydrochlorothiazide]   ?  hypokalemia  ? Xanax [Alprazolam] Other (See Comments)  ?  Causes hallucinations   ? ? ?Chief Complaint  ?Patient presents with  ? Acute Visit  ?  COVID positive ?  ? ? ?HPI: ? ?He has tested positive for covid. He denies any fevers; no cough or shortness of breath. No body aches or pains. His CRP is elevated at 6.7  ? ?Past Medical History:  ?Diagnosis Date  ? Anxiety   ? Cancer [facility])   ? prostate  ? Chronic chest wall pain   ? Essential hypertension, benign 06/20/2016  ? GAD (generalized anxiety disorder)   ? GERD (gastroesophageal reflux disease)   ? H/O echocardiogram 2016  ? normal  ? Heart murmur   ? "leaking heart valve"  ? High cholesterol 06/20/2016  ? HOH (hard of hearing)   ? left  ? Hx of falling   ? Interstitial pulmonary disease ([place])   ? Lower GI bleed   ? MDD (major depressive disorder)   ? Murmur, cardiac   ? Poor historian   ? PUD (peptic ulcer disease)   ? RLS (restless legs syndrome)   ? Shortness of breath   ? Wrist fracture 12/16/2011  ? ? ?Past Surgical History:  ?Procedure Laterality Date  ? CHOLECYSTECTOMY N/A 06/28/2020  ? Procedure: LAPAROSCOPIC CHOLECYSTECTOMY;  Surgeon: [name], MD;  Location: [facility];  Service: General;  Laterality: N/A;  pt in [place]  ? COLONOSCOPY  2005  ? Rehman: Sigmoid colon diverticulosis, moderate sized external hemorrhoids. A few focal areas of mucosal erythema felt to be nonspecific.  ? ESOPHAGOGASTRODUODENOSCOPY    ? ESOPHAGOGASTRODUODENOSCOPY N/A 07/18/2016  ? Rehman: normal exam except duodenal scar. h.pylori serologies negative  ? FLEXIBLE SIGMOIDOSCOPY N/A 06/11/2020  ? Procedure: FLEXIBLE SIGMOIDOSCOPY;  Surgeon: Eloise Harman, DO;  [facility];  Service: Endoscopy;  Laterality: N/A;  ? HARDWARE REMOVAL Right 03/20/2021  ? Procedure: HARDWARE  REMOVAL right hip;  Surgeon: Carole Civil, MD;  Location: [facility];  Service: Orthopedics;  Laterality: Right;  ? HIP PINNING,CANNULATED Right 03/10/2020  ? Procedure: INTERNAL FIXATION RIGHT HIP;  Surgeon: Carole Civil, MD;  Location: [facility];  Service: Orthopedics;  Laterality: Right;  ? ORIF WRIST FRACTURE  12/19/2011  ? Procedure: OPEN REDUCTION INTERNAL FIXATION (ORIF) WRIST FRACTURE;  Surgeon: Carole Civil, MD;  Location: [facility];  Service: Orthopedics;  Laterality: Right;  ? prostate cancer    ? Diagnosed in the 90s  ? TONSILLECTOMY    ? ? ?Social History  ? ?Socioeconomic History  ? Marital status: Widowed  ?  Spouse name: Not on file  ? Number of children: Not on file  ? Years of education: Not on file  ? Highest education level: Not on file  ?Occupational History  ? Occupation: retired  ?  Employer: RETIRED  ?Tobacco Use  ? Smoking status: Never  ? Smokeless tobacco: Never  ?Vaping Use  ? Vaping Use: Never used  ?Substance and Sexual Activity  ? Alcohol use: No  ? Drug use: No  ? Sexual activity: Not Currently  ?Other Topics Concern  ? Not on file  ?Social History Narrative  ? Is long term resident of [facility]   ? ?Social Determinants of Health  ? ?Financial Resource Strain: Not  on file  ?Food Insecurity: Not on file  ?Transportation Needs: Not on file  ?Physical Activity: Not on file  ?Stress: Not on file  ?Social Connections: Not on file  ?Intimate Partner Violence: Not on file  ? ?Family History  ?Problem Relation Age of Onset  ? Cancer Other   ? Heart attack Mother   ? Cancer Brother   ? Heart attack Brother   ? Heart disease Sister   ?     by pass  ? ? ? ? ?VITAL SIGNS ?BP 136/70   Pulse (!) 55   Temp (!) 96.4 ?F (35.8 ?C)   Resp 20   Ht '5\' 9"'$  (1.753 m)   Wt 177 lb 12.8 oz (80.6 kg)   SpO2 97%   BMI 26.26 kg/m?  ? ?Outpatient Encounter Medications as of 09/10/2021  ?Medication Sig  ? acetaminophen (TYLENOL) 500 MG tablet Take 500 mg by mouth every 6 (six) hours. Max '3000mg'$  in 24 hour  period  ? alfuzosin (UROXATRAL) 10 MG 24 hr tablet Take 10 mg by mouth daily. 6 pm  ? Ascorbic Acid (VITAMIN C) 1000 MG tablet Take 1,000 mg by mouth daily.  ? Balsam Engineer, materials (VENELEX) OINT Apply 1 application topically every 8 (eight) hours as needed (Apply to bilateral buttocks & sacral area qshift for blanchable erythema.).  ? calcium carbonate (TUMS - DOSED IN MG ELEMENTAL CALCIUM) 500 MG chewable tablet Chew 1 tablet by mouth 2 (two) times daily as needed for indigestion or heartburn.   ? diltiazem (CARDIZEM CD) 360 MG 24 hr capsule Take 1 capsule (360 mg total) by mouth daily.  ? Ergocalciferol (VITAMIN D2 PO) 1,250 mcg (50,000 unit); oral ?Special Instructions: Once A Day Every 7 Daysx 2 doses.  ? Flax Oil-Fish Oil-Borage Oil (FISH OIL-FLAX OIL-BORAGE OIL) CAPS Take 1 capsule by mouth daily.  ? isosorbide mononitrate (IMDUR) 30 MG 24 hr tablet Take 1 tablet (30 mg total) by mouth daily.  ? lisinopril (ZESTRIL) 20 MG tablet Take 20 mg by mouth daily. Hypertensive chronic kidney disease with stage 1 through stage 4 chronic kidney disease, or unspecified chronic kidney disease  ? loperamide (IMODIUM A-D) 2 MG tablet Take 2 mg by mouth as needed (after each stool. (do not exceed more than 4 tabs in 24 hours)).  ? loratadine (CLARITIN) 10 MG tablet Take 10 mg by mouth daily.  ? Melatonin 10 MG TABS Take 10 mg by mouth at bedtime.  ? meloxicam (MOBIC) 7.5 MG tablet Take 7.5 mg by mouth daily. give for left hip pain  ? memantine (NAMENDA) 10 MG tablet Take 10 mg by mouth 2 (two) times daily.  ? methocarbamol (ROBAXIN) 500 MG tablet Take 500 mg by mouth daily.  ? metoprolol tartrate (LOPRESSOR) 25 MG tablet Take 1 tablet (25 mg total) by mouth 2 (two) times daily.  ? molnupiravir EUA (LAGEVRIO) 200 MG CAPS capsule Take 4 capsules by mouth 2 (two) times daily.  ? omeprazole (PRILOSEC) 20 MG capsule Take 1 capsule (20 mg total) by mouth daily before breakfast.  ? ondansetron (ZOFRAN) 8 MG tablet Take 8 mg by  mouth as needed for nausea.  ? OXYGEN Inhale 2 L into the lungs as needed (To keep O2 ststa > 90 %).  ? polyethylene glycol (MIRALAX / GLYCOLAX) 17 g packet Take 17 g by mouth daily. On Tuesday, Thursday, and Saturday  ? POTASSIUM CHLORIDE ER PO ER particles/crystals; 20 mEq; Twice A Day; oral ?Special Instructions: hypokalemia  ? potassium  chloride SA (KLOR-CON) 20 MEQ tablet Take 20 mEq by mouth 2 (two) times daily.  ? QUEtiapine (SEROQUEL) 25 MG tablet Take 12.5 mg by mouth 2 (two) times daily.  ? rOPINIRole (REQUIP) 0.25 MG tablet Take 0.25 mg by mouth at bedtime.  ? senna-docusate (SENOKOT-S) 8.6-50 MG tablet Take 1 tablet by mouth at bedtime as needed for mild constipation.  ? simethicone (MYLICON) 518 MG chewable tablet Chew 125 mg by mouth 3 (three) times daily as needed ("increased gas").  ? UNABLE TO FIND Diet:Regular  ? venlafaxine XR (EFFEXOR-XR) 150 MG 24 hr capsule Take 150 mg by mouth daily with breakfast.  ? Vibegron (GEMTESA) 75 MG TABS Take 1 tablet by mouth daily as needed.  ? vitamin B-12 (CYANOCOBALAMIN) 1000 MCG tablet Take 1,000 mcg by mouth daily. for vitamin B 12 level 205  ? ?No facility-administered encounter medications on file as of 09/10/2021.  ? ? ? ?SIGNIFICANT DIAGNOSTIC EXAMS ? ? ?NO NEW EXAMS.  ? ? ?LABS REVIEWED PREVIOUS   ? ?10-04-20: albumin 3.2  ?10-19-20: PSA 12.25 (13.05)  ?12-21-20: wbc 4.6; hgb 13.4 hct 40.9 mcv 86.1 plt 223; glucose 104; bun 22; creat 0.89; k+ 3.0; na++ 143; ca 9.3 GFR>60; liver normal albumin 3.3; chol 128; ldl 71; trig 85 hdl 40   ?12-25-20: k+ 3.2 ?01-01-21: k+ 3.2 ?01-08-21: k+ 4.5 ?01-19-21: glucose 108; bun 20; creat 0.96; k+ 3.6; na++ 139; ca 9.0 GFR>60 ?02-05-21: glucose 109; bun 25; creat 1.05; k+ 4.5; na++ 141; ca 9.5; GFR>60.  ?03-13-21: wbc 7.3; hgb 14.0; hct 42.5; mcv 85.5 plt 212; glucose 126; bun 24; creat 1.03 ;k+ 3.9; na++ 139; ca 9.6; GFR>60 ?03-15-21: hgb a1c 5.7 ?03-20-21: wbc 20.3; hgb 14.6; hct 44.4; mcv 86.7 plt 229; glcusoe 201; bun 26; creat  1.44; k+ 4.2; na++ 138; ca 9.2; GFR 46  ?03-22-21: aldosterone 7.6; cortisol AM 19.5;  ?03-26-21: wbc 6.3; hgb 12.4; hct 37.9; mcv 87.5 plt 230; glucose 117; bun 28; creat 1.13; k+ 4.4; na++ 138; ca 9.2; GFR>60  ?11-14

## 2021-09-11 ENCOUNTER — Encounter: Payer: Self-pay | Admitting: Adult Health

## 2021-09-11 ENCOUNTER — Non-Acute Institutional Stay (SKILLED_NURSING_FACILITY): Payer: Medicare Other | Admitting: Adult Health

## 2021-09-11 DIAGNOSIS — J1282 Pneumonia due to coronavirus disease 2019: Secondary | ICD-10-CM | POA: Diagnosis not present

## 2021-09-11 DIAGNOSIS — U071 COVID-19: Secondary | ICD-10-CM

## 2021-09-11 NOTE — Progress Notes (Signed)
?Location:  [place] ?Nursing Home Room Number: 132-P ?Place of Service:  SNF (31) ? ? ?CODE STATUS: DNR ? ?Allergies  ?Allergen Reactions  ? Hctz [Hydrochlorothiazide]   ?  hypokalemia  ? Xanax [Alprazolam] Other (See Comments)  ?  Causes hallucinations   ? ? ?Chief Complaint  ?Patient presents with  ? Follow-up  ?  Follow-up on recent labs   ? ? ?HPI: ? ?He has covid 19. His chest x-ray demonstrate probable pneumonia. He denies any cough or shortness of breath. There are no reports of fevers.  ? ?Past Medical History:  ?Diagnosis Date  ? Anxiety   ? Cancer [facility])   ? prostate  ? Chronic chest wall pain   ? Essential hypertension, benign 06/20/2016  ? GAD (generalized anxiety disorder)   ? GERD (gastroesophageal reflux disease)   ? H/O echocardiogram 2016  ? normal  ? Heart murmur   ? "leaking heart valve"  ? High cholesterol 06/20/2016  ? HOH (hard of hearing)   ? left  ? Hx of falling   ? Interstitial pulmonary disease ([place])   ? Lower GI bleed   ? MDD (major depressive disorder)   ? Murmur, cardiac   ? Poor historian   ? PUD (peptic ulcer disease)   ? RLS (restless legs syndrome)   ? Shortness of breath   ? Wrist fracture 12/16/2011  ? ? ?Past Surgical History:  ?Procedure Laterality Date  ? CHOLECYSTECTOMY N/A 06/28/2020  ? Procedure: LAPAROSCOPIC CHOLECYSTECTOMY;  Surgeon: [name], MD;  Location: [facility];  Service: General;  Laterality: N/A;  pt in [place]  ? COLONOSCOPY  2005  ? Rehman: Sigmoid colon diverticulosis, moderate sized external hemorrhoids. A few focal areas of mucosal erythema felt to be nonspecific.  ? ESOPHAGOGASTRODUODENOSCOPY    ? ESOPHAGOGASTRODUODENOSCOPY N/A 07/18/2016  ? Rehman: normal exam except duodenal scar. h.pylori serologies negative  ? FLEXIBLE SIGMOIDOSCOPY N/A 06/11/2020  ? Procedure: FLEXIBLE SIGMOIDOSCOPY;  Surgeon: Eloise Harman, DO;  [facility];  Service: Endoscopy;  Laterality: N/A;  ? HARDWARE REMOVAL Right 03/20/2021  ? Procedure:  HARDWARE REMOVAL right hip;  Surgeon: Carole Civil, MD;  Location: [facility];  Service: Orthopedics;  Laterality: Right;  ? HIP PINNING,CANNULATED Right 03/10/2020  ? Procedure: INTERNAL FIXATION RIGHT HIP;  Surgeon: Carole Civil, MD;  Location: [facility];  Service: Orthopedics;  Laterality: Right;  ? ORIF WRIST FRACTURE  12/19/2011  ? Procedure: OPEN REDUCTION INTERNAL FIXATION (ORIF) WRIST FRACTURE;  Surgeon: Carole Civil, MD;  Location: [facility];  Service: Orthopedics;  Laterality: Right;  ? prostate cancer    ? Diagnosed in the 90s  ? TONSILLECTOMY    ? ? ?Social History  ? ?Socioeconomic History  ? Marital status: Widowed  ?  Spouse name: Not on file  ? Number of children: Not on file  ? Years of education: Not on file  ? Highest education level: Not on file  ?Occupational History  ? Occupation: retired  ?  Employer: RETIRED  ?Tobacco Use  ? Smoking status: Never  ? Smokeless tobacco: Never  ?Vaping Use  ? Vaping Use: Never used  ?Substance and Sexual Activity  ? Alcohol use: No  ? Drug use: No  ? Sexual activity: Not Currently  ?Other Topics Concern  ? Not on file  ?Social History Narrative  ? Is long term resident of [facility]   ? ?Social Determinants of Health  ? ?Financial Resource Strain: Not on file  ?  Food Insecurity: Not on file  ?Transportation Needs: Not on file  ?Physical Activity: Not on file  ?Stress: Not on file  ?Social Connections: Not on file  ?Intimate Partner Violence: Not on file  ? ?Family History  ?Problem Relation Age of Onset  ? Cancer Other   ? Heart attack Mother   ? Cancer Brother   ? Heart attack Brother   ? Heart disease Sister   ?     by pass  ? ? ? ? ?VITAL SIGNS ?BP (!) 175/80   Pulse 70   Temp (!) 97.5 ?F (36.4 ?C)   Resp 20   Ht '5\' 9"'$  (1.753 m)   Wt 177 lb 12.8 oz (80.6 kg)   SpO2 92%   BMI 26.26 kg/m?  ? ?Outpatient Encounter Medications as of 09/11/2021  ?Medication Sig  ? acetaminophen (TYLENOL) 500 MG tablet Take 500 mg by mouth every 6 (six) hours. Max '3000mg'$  in 24  hour period  ? alfuzosin (UROXATRAL) 10 MG 24 hr tablet Take 10 mg by mouth daily. 6 pm  ? Ascorbic Acid (VITAMIN C) 1000 MG tablet Take 1,000 mg by mouth daily.  ? Balsam Engineer, materials (VENELEX) OINT Apply 1 application topically every 8 (eight) hours as needed (Apply to bilateral buttocks & sacral area qshift for blanchable erythema.).  ? calcium carbonate (TUMS - DOSED IN MG ELEMENTAL CALCIUM) 500 MG chewable tablet Chew 1 tablet by mouth 2 (two) times daily as needed for indigestion or heartburn.   ? diltiazem (CARDIZEM CD) 360 MG 24 hr capsule Take 1 capsule (360 mg total) by mouth daily.  ? Ergocalciferol (VITAMIN D2 PO) 1,250 mcg (50,000 unit); oral ?Special Instructions: Once A Day Every 7 Daysx 2 doses.  ? Flax Oil-Fish Oil-Borage Oil (FISH OIL-FLAX OIL-BORAGE OIL) CAPS Take 1 capsule by mouth daily.  ? isosorbide mononitrate (IMDUR) 60 MG 24 hr tablet Take 60 mg by mouth daily.  ? lisinopril (ZESTRIL) 20 MG tablet Take 20 mg by mouth daily. Hypertensive chronic kidney disease with stage 1 through stage 4 chronic kidney disease, or unspecified chronic kidney disease  ? loperamide (IMODIUM A-D) 2 MG tablet Take 2 mg by mouth as needed (after each stool. (do not exceed more than 4 tabs in 24 hours)).  ? loratadine (CLARITIN) 10 MG tablet Take 10 mg by mouth daily.  ? Melatonin 10 MG TABS Take 10 mg by mouth at bedtime.  ? meloxicam (MOBIC) 7.5 MG tablet Take 7.5 mg by mouth daily. give for left hip pain  ? memantine (NAMENDA) 10 MG tablet Take 10 mg by mouth 2 (two) times daily.  ? methocarbamol (ROBAXIN) 500 MG tablet Take 500 mg by mouth daily.  ? metoprolol tartrate (LOPRESSOR) 25 MG tablet Take 1 tablet (25 mg total) by mouth 2 (two) times daily.  ? molnupiravir EUA (LAGEVRIO) 200 MG CAPS capsule Take 4 capsules by mouth 2 (two) times daily.  ? omeprazole (PRILOSEC) 20 MG capsule Take 1 capsule (20 mg total) by mouth daily before breakfast.  ? ondansetron (ZOFRAN) 8 MG tablet Take 8 mg by mouth as  needed for nausea.  ? OXYGEN Inhale 2 L into the lungs as needed (To keep O2 ststa > 90 %).  ? polyethylene glycol (MIRALAX / GLYCOLAX) 17 g packet Take 17 g by mouth daily. On Tuesday, Thursday, and Saturday  ? POTASSIUM CHLORIDE ER PO ER particles/crystals; 20 mEq; Twice A Day; oral ?Special Instructions: hypokalemia  ? potassium chloride SA (KLOR-CON) 20 MEQ tablet  Take 20 mEq by mouth 2 (two) times daily.  ? predniSONE (DELTASONE) 20 MG tablet Take 20 mg by mouth in the morning and at bedtime.  ? QUEtiapine (SEROQUEL) 25 MG tablet Take 12.5 mg by mouth 2 (two) times daily.  ? rOPINIRole (REQUIP) 0.25 MG tablet Take 0.25 mg by mouth at bedtime.  ? senna-docusate (SENOKOT-S) 8.6-50 MG tablet Take 1 tablet by mouth at bedtime as needed for mild constipation.  ? simethicone (MYLICON) 761 MG chewable tablet Chew 125 mg by mouth 3 (three) times daily as needed ("increased gas").  ? UNABLE TO FIND Diet:Regular  ? venlafaxine XR (EFFEXOR-XR) 150 MG 24 hr capsule Take 150 mg by mouth daily with breakfast.  ? Vibegron (GEMTESA) 75 MG TABS Take 1 tablet by mouth daily as needed.  ? vitamin B-12 (CYANOCOBALAMIN) 1000 MCG tablet Take 1,000 mcg by mouth daily. for vitamin B 12 level 205  ? [DISCONTINUED] isosorbide mononitrate (IMDUR) 30 MG 24 hr tablet Take 1 tablet (30 mg total) by mouth daily.  ? ?No facility-administered encounter medications on file as of 09/11/2021.  ? ? ? ?SIGNIFICANT DIAGNOSTIC EXAMS ? ? ?TODAY ? ?09-10-21: chest x-ray: patch asymmetric inflammatory changes. These could represent areas of atypical infection; focal atelectasis infarction or evolving consolidation among other possibilities.  ? ? ?LABS REVIEWED PREVIOUS   ? ?10-04-20: albumin 3.2  ?10-19-20: PSA 12.25 (13.05)  ?12-21-20: wbc 4.6; hgb 13.4 hct 40.9 mcv 86.1 plt 223; glucose 104; bun 22; creat 0.89; k+ 3.0; na++ 143; ca 9.3 GFR>60; liver normal albumin 3.3; chol 128; ldl 71; trig 85 hdl 40   ?12-25-20: k+ 3.2 ?01-01-21: k+ 3.2 ?01-08-21: k+  4.5 ?01-19-21: glucose 108; bun 20; creat 0.96; k+ 3.6; na++ 139; ca 9.0 GFR>60 ?02-05-21: glucose 109; bun 25; creat 1.05; k+ 4.5; na++ 141; ca 9.5; GFR>60.  ?03-13-21: wbc 7.3; hgb 14.0; hct 42.5; mcv 85.5 plt 212; glucose 126

## 2021-09-12 ENCOUNTER — Encounter: Payer: Self-pay | Admitting: Adult Health

## 2021-09-12 ENCOUNTER — Non-Acute Institutional Stay (SKILLED_NURSING_FACILITY): Payer: Medicare Other | Admitting: Adult Health

## 2021-09-12 DIAGNOSIS — C61 Malignant neoplasm of prostate: Secondary | ICD-10-CM | POA: Diagnosis not present

## 2021-09-12 DIAGNOSIS — K219 Gastro-esophageal reflux disease without esophagitis: Secondary | ICD-10-CM

## 2021-09-12 DIAGNOSIS — N3281 Overactive bladder: Secondary | ICD-10-CM | POA: Diagnosis not present

## 2021-09-12 DIAGNOSIS — N183 Chronic kidney disease, stage 3 unspecified: Secondary | ICD-10-CM | POA: Diagnosis not present

## 2021-09-12 DIAGNOSIS — E876 Hypokalemia: Secondary | ICD-10-CM

## 2021-09-12 DIAGNOSIS — I129 Hypertensive chronic kidney disease with stage 1 through stage 4 chronic kidney disease, or unspecified chronic kidney disease: Secondary | ICD-10-CM | POA: Diagnosis not present

## 2021-09-12 NOTE — Progress Notes (Signed)
?Location:  [place] ?Nursing Home Room Number: 132-P ?Place of Service:  SNF (31) ? ? ?CODE STATUS: DNR ? ?Allergies  ?Allergen Reactions  ? Hctz [Hydrochlorothiazide]   ?  hypokalemia  ? Xanax [Alprazolam] Other (See Comments)  ?  Causes hallucinations   ? ? ?Chief Complaint  ?Patient presents with  ? Medical Management of Chronic Issues ? ?            Benign hypertension with chronic kidney disease stage III   Gastroesophageal reflux disease without esophagitis:  Prostate caner/over active bladder;  Hypokalemia  ? ? ?HPI: ? ?He is a 88 year old long term resident of this facility being seen for the management of his chronic illnesses: Benign hypertension with chronic kidney disease stage III   Gastroesophageal reflux disease without esophagitis:  Prostate caner/over active bladder;  Hypokalemia. He is presently under treatment for covid. He denies any cough or shortness of breath. There are no fevers.  ? ?Past Medical History:  ?Diagnosis Date  ? Anxiety   ? Cancer [facility])   ? prostate  ? Chronic chest wall pain   ? Essential hypertension, benign 06/20/2016  ? GAD (generalized anxiety disorder)   ? GERD (gastroesophageal reflux disease)   ? H/O echocardiogram 2016  ? normal  ? Heart murmur   ? "leaking heart valve"  ? High cholesterol 06/20/2016  ? HOH (hard of hearing)   ? left  ? Hx of falling   ? Interstitial pulmonary disease ([place])   ? Lower GI bleed   ? MDD (major depressive disorder)   ? Murmur, cardiac   ? Poor historian   ? PUD (peptic ulcer disease)   ? RLS (restless legs syndrome)   ? Shortness of breath   ? Wrist fracture 12/16/2011  ? ? ?Past Surgical History:  ?Procedure Laterality Date  ? CHOLECYSTECTOMY N/A 06/28/2020  ? Procedure: LAPAROSCOPIC CHOLECYSTECTOMY;  Surgeon: [name], MD;  Location: [facility];  Service: General;  Laterality: N/A;  pt in [place]  ? COLONOSCOPY  2005  ? Rehman: Sigmoid colon diverticulosis, moderate sized external hemorrhoids. A few focal areas of  mucosal erythema felt to be nonspecific.  ? ESOPHAGOGASTRODUODENOSCOPY    ? ESOPHAGOGASTRODUODENOSCOPY N/A 07/18/2016  ? Rehman: normal exam except duodenal scar. h.pylori serologies negative  ? FLEXIBLE SIGMOIDOSCOPY N/A 06/11/2020  ? Procedure: FLEXIBLE SIGMOIDOSCOPY;  Surgeon: Eloise Harman, DO;  Location: [facility];  Service: Endoscopy;  Laterality: N/A;  ? HARDWARE REMOVAL Right 03/20/2021  ? Procedure: HARDWARE REMOVAL right hip;  Surgeon: Carole Civil, MD;  Location: [facility];  Service: Orthopedics;  Laterality: Right;  ? HIP PINNING,CANNULATED Right 03/10/2020  ? Procedure: INTERNAL FIXATION RIGHT HIP;  Surgeon: Carole Civil, MD;  Location: [facility];  Service: Orthopedics;  Laterality: Right;  ? ORIF WRIST FRACTURE  12/19/2011  ? Procedure: OPEN REDUCTION INTERNAL FIXATION (ORIF) WRIST FRACTURE;  Surgeon: Carole Civil, MD;  Location: [facility];  Service: Orthopedics;  Laterality: Right;  ? prostate cancer    ? Diagnosed in the 90s  ? TONSILLECTOMY    ? ? ?Social History  ? ?Socioeconomic History  ? Marital status: Widowed  ?  Spouse name: Not on file  ? Number of children: Not on file  ? Years of education: Not on file  ? Highest education level: Not on file  ?Occupational History  ? Occupation: retired  ?  Employer: RETIRED  ?Tobacco Use  ? Smoking status: Never  ?  Smokeless tobacco: Never  ?Vaping Use  ? Vaping Use: Never used  ?Substance and Sexual Activity  ? Alcohol use: No  ? Drug use: No  ? Sexual activity: Not Currently  ?Other Topics Concern  ? Not on file  ?Social History Narrative  ? Is long term resident of [facility]   ? ?Social Determinants of Health  ? ?Financial Resource Strain: Not on file  ?Food Insecurity: Not on file  ?Transportation Needs: Not on file  ?Physical Activity: Not on file  ?Stress: Not on file  ?Social Connections: Not on file  ?Intimate Partner Violence: Not on file  ? ?Family History  ?Problem Relation Age of Onset  ? Cancer Other   ? Heart attack Mother   ? Cancer  Brother   ? Heart attack Brother   ? Heart disease Sister   ?     by pass  ? ? ? ? ?VITAL SIGNS ?BP 140/85   Pulse (!) 56   Temp 99.7 ?F (37.6 ?C)   Resp 20   Ht '5\' 9"'$  (1.753 m)   Wt 177 lb 12.8 oz (80.6 kg)   SpO2 98%   BMI 26.26 kg/m?  ? ?Outpatient Encounter Medications as of 09/12/2021  ?Medication Sig  ? acetaminophen (TYLENOL) 500 MG tablet Take 500 mg by mouth every 6 (six) hours. Max '3000mg'$  in 24 hour period  ? alfuzosin (UROXATRAL) 10 MG 24 hr tablet Take 10 mg by mouth daily. 6 pm  ? Ascorbic Acid (VITAMIN C) 1000 MG tablet Take 1,000 mg by mouth daily.  ? Balsam Engineer, materials (VENELEX) OINT Apply 1 application topically every 8 (eight) hours as needed (Apply to bilateral buttocks & sacral area qshift for blanchable erythema.).  ? calcium carbonate (TUMS - DOSED IN MG ELEMENTAL CALCIUM) 500 MG chewable tablet Chew 1 tablet by mouth 2 (two) times daily as needed for indigestion or heartburn.   ? diltiazem (CARDIZEM CD) 360 MG 24 hr capsule Take 1 capsule (360 mg total) by mouth daily.  ? Ergocalciferol (VITAMIN D2 PO) 1,250 mcg (50,000 unit); oral ?Special Instructions: Once A Day Every 7 Daysx 2 doses.  ? Flax Oil-Fish Oil-Borage Oil (FISH OIL-FLAX OIL-BORAGE OIL) CAPS Take 1 capsule by mouth daily.  ? isosorbide mononitrate (IMDUR) 60 MG 24 hr tablet Take 60 mg by mouth daily.  ? lisinopril (ZESTRIL) 20 MG tablet Take 20 mg by mouth daily. Hypertensive chronic kidney disease with stage 1 through stage 4 chronic kidney disease, or unspecified chronic kidney disease  ? loperamide (IMODIUM A-D) 2 MG tablet Take 2 mg by mouth as needed (after each stool. (do not exceed more than 4 tabs in 24 hours)).  ? loratadine (CLARITIN) 10 MG tablet Take 10 mg by mouth daily.  ? Melatonin 10 MG TABS Take 10 mg by mouth at bedtime.  ? meloxicam (MOBIC) 7.5 MG tablet Take 7.5 mg by mouth daily. give for left hip pain  ? memantine (NAMENDA) 10 MG tablet Take 10 mg by mouth 2 (two) times daily.  ? methocarbamol  (ROBAXIN) 500 MG tablet Take 500 mg by mouth daily.  ? metoprolol tartrate (LOPRESSOR) 25 MG tablet Take 1 tablet (25 mg total) by mouth 2 (two) times daily.  ? [EXPIRED] molnupiravir EUA (LAGEVRIO) 200 MG CAPS capsule Take 4 capsules by mouth 2 (two) times daily.  ? omeprazole (PRILOSEC) 20 MG capsule Take 1 capsule (20 mg total) by mouth daily before breakfast.  ? ondansetron (ZOFRAN) 8 MG tablet Take 8 mg by  mouth as needed for nausea.  ? OXYGEN Inhale 2 L into the lungs as needed (To keep O2 ststa > 90 %).  ? polyethylene glycol (MIRALAX / GLYCOLAX) 17 g packet Take 17 g by mouth daily. On Tuesday, Thursday, and Saturday  ? potassium chloride SA (KLOR-CON) 20 MEQ tablet Take 20 mEq by mouth 2 (two) times daily.  ? predniSONE (DELTASONE) 20 MG tablet Take 20 mg by mouth in the morning and at bedtime.  ? QUEtiapine (SEROQUEL) 25 MG tablet Take 12.5 mg by mouth 2 (two) times daily.  ? rOPINIRole (REQUIP) 0.25 MG tablet Take 0.25 mg by mouth at bedtime.  ? senna-docusate (SENOKOT-S) 8.6-50 MG tablet Take 1 tablet by mouth at bedtime as needed for mild constipation.  ? simethicone (MYLICON) 818 MG chewable tablet Chew 125 mg by mouth 3 (three) times daily as needed ("increased gas").  ? UNABLE TO FIND Diet:Regular  ? venlafaxine XR (EFFEXOR-XR) 150 MG 24 hr capsule Take 150 mg by mouth daily with breakfast.  ? Vibegron (GEMTESA) 75 MG TABS Take 1 tablet by mouth daily as needed.  ? vitamin B-12 (CYANOCOBALAMIN) 1000 MCG tablet Take 1,000 mcg by mouth daily. for vitamin B 12 level 205  ? [DISCONTINUED] POTASSIUM CHLORIDE ER PO ER particles/crystals; 20 mEq; Twice A Day; oral ?Special Instructions: hypokalemia  ? ?No facility-administered encounter medications on file as of 09/12/2021.  ? ? ? ?SIGNIFICANT DIAGNOSTIC EXAMS ? ?TODAY ? ?09-10-21: chest x-ray: patch asymmetric inflammatory changes. These could represent areas of atypical infection; focal atelectasis infarction or evolving consolidation among other  possibilities.  ? ? ?LABS REVIEWED PREVIOUS   ? ?10-04-20: albumin 3.2  ?10-19-20: PSA 12.25 (13.05)  ?12-21-20: wbc 4.6; hgb 13.4 hct 40.9 mcv 86.1 plt 223; glucose 104; bun 22; creat 0.89; k+ 3.0; na++ 143; ca 9.3 GFR>60; liver n

## 2021-09-13 ENCOUNTER — Ambulatory Visit (HOSPITAL_COMMUNITY): Payer: Medicare Other

## 2021-09-13 DIAGNOSIS — U071 COVID-19: Secondary | ICD-10-CM

## 2021-09-13 DIAGNOSIS — R7982 Elevated C-reactive protein (CRP): Secondary | ICD-10-CM | POA: Insufficient documentation

## 2021-09-13 HISTORY — DX: COVID-19: U07.1

## 2021-09-14 DIAGNOSIS — U071 COVID-19: Secondary | ICD-10-CM | POA: Insufficient documentation

## 2021-09-14 DIAGNOSIS — J1282 Pneumonia due to coronavirus disease 2019: Secondary | ICD-10-CM | POA: Insufficient documentation

## 2021-09-17 ENCOUNTER — Other Ambulatory Visit (HOSPITAL_COMMUNITY)
Admission: RE | Admit: 2021-09-17 | Discharge: 2021-09-17 | Disposition: A | Discharge status: Home / Self Care | Payer: Medicare Other | Source: Skilled Nursing Facility | Attending: Adult Health | Admitting: Adult Health

## 2021-09-17 DIAGNOSIS — U071 COVID-19: Secondary | ICD-10-CM | POA: Insufficient documentation

## 2021-09-17 LAB — C-REACTIVE PROTEIN: CRP: <0.5 mg/dL (ref <1.0)

## 2021-09-19 ENCOUNTER — Encounter: Payer: Self-pay | Admitting: Adult Health

## 2021-09-21 DIAGNOSIS — F331 Major depressive disorder, recurrent, moderate: Secondary | ICD-10-CM | POA: Diagnosis not present

## 2021-09-27 ENCOUNTER — Ambulatory Visit (HOSPITAL_COMMUNITY)
Admission: RE | Admit: 2021-09-27 | Discharge: 2021-09-27 | Disposition: A | Discharge status: Home / Self Care | Payer: Medicare Other | Source: Ambulatory Visit | Attending: Adult Health | Admitting: Adult Health

## 2021-09-27 DIAGNOSIS — G9389 Other specified disorders of brain: Secondary | ICD-10-CM | POA: Diagnosis not present

## 2021-09-27 DIAGNOSIS — F0390 Unspecified dementia without behavioral disturbance: Secondary | ICD-10-CM | POA: Insufficient documentation

## 2021-09-28 DIAGNOSIS — F331 Major depressive disorder, recurrent, moderate: Secondary | ICD-10-CM | POA: Diagnosis not present

## 2021-10-03 DIAGNOSIS — F321 Major depressive disorder, single episode, moderate: Secondary | ICD-10-CM | POA: Diagnosis not present

## 2021-10-03 DIAGNOSIS — F411 Generalized anxiety disorder: Secondary | ICD-10-CM | POA: Diagnosis not present

## 2021-10-03 DIAGNOSIS — G3184 Mild cognitive impairment, so stated: Secondary | ICD-10-CM | POA: Diagnosis not present

## 2021-10-05 ENCOUNTER — Encounter: Payer: Self-pay | Admitting: Adult Health

## 2021-10-05 ENCOUNTER — Non-Acute Institutional Stay (SKILLED_NURSING_FACILITY): Payer: Medicare Other | Admitting: Adult Health

## 2021-10-05 DIAGNOSIS — I700 Atherosclerosis of aorta: Secondary | ICD-10-CM | POA: Diagnosis not present

## 2021-10-05 DIAGNOSIS — J849 Interstitial pulmonary disease, unspecified: Secondary | ICD-10-CM

## 2021-10-05 DIAGNOSIS — N1832 Chronic kidney disease, stage 3b: Secondary | ICD-10-CM | POA: Diagnosis not present

## 2021-10-05 DIAGNOSIS — F331 Major depressive disorder, recurrent, moderate: Secondary | ICD-10-CM | POA: Diagnosis not present

## 2021-10-05 NOTE — Progress Notes (Signed)
?Location:  [place] ?Nursing Home Room Number: 156-D ?Place of Service:  SNF (31) ? ? ?CODE STATUS: DNR ? ?Allergies  ?Allergen Reactions  ? Hctz [Hydrochlorothiazide]   ?  hypokalemia  ? Xanax [Alprazolam] Other (See Comments)  ?  Causes hallucinations   ? ? ?Chief Complaint  ?Patient presents with  ? Acute Visit  ?  Care plan meeting  ? ? ?HPI: ? ?We have come together for his care plan meeting. Family present. BIMS 15/15 mood 9/30: trouble sleeping; decreased energy; nervous; some depression; trouble concentrating. He does ambulate with a walker; has had no falls. He is independent to supervision for his adls. He is continent of bladder and bowel. Dietary: has a good appetite; weight is 182.2 pounds is stable; feeds self; regular diet.   Therapy: none at this time. He continues to be followed for his chronic illnesses including: Aortic atherosclerosis Stage 3b chronic kidney disease  Interstitial lung disease ? ?Past Medical History:  ?Diagnosis Date  ? Anxiety   ? Cancer [facility])   ? prostate  ? Chronic chest wall pain   ? Essential hypertension, benign 06/20/2016  ? GAD (generalized anxiety disorder)   ? GERD (gastroesophageal reflux disease)   ? H/O echocardiogram 2016  ? normal  ? Heart murmur   ? "leaking heart valve"  ? High cholesterol 06/20/2016  ? HOH (hard of hearing)   ? left  ? Hx of falling   ? Interstitial pulmonary disease ([place])   ? Lower GI bleed   ? MDD (major depressive disorder)   ? Murmur, cardiac   ? Poor historian   ? PUD (peptic ulcer disease)   ? RLS (restless legs syndrome)   ? Shortness of breath   ? Wrist fracture 12/16/2011  ? ? ?Past Surgical History:  ?Procedure Laterality Date  ? CHOLECYSTECTOMY N/A 06/28/2020  ? Procedure: LAPAROSCOPIC CHOLECYSTECTOMY;  Surgeon: [name], MD;  Location: [facility];  Service: General;  Laterality: N/A;  pt in [place]  ? COLONOSCOPY  2005  ? Rehman: Sigmoid colon diverticulosis, moderate sized external hemorrhoids. A few focal  areas of mucosal erythema felt to be nonspecific.  ? ESOPHAGOGASTRODUODENOSCOPY    ? ESOPHAGOGASTRODUODENOSCOPY N/A 07/18/2016  ? Rehman: normal exam except duodenal scar. h.pylori serologies negative  ? FLEXIBLE SIGMOIDOSCOPY N/A 06/11/2020  ? Procedure: FLEXIBLE SIGMOIDOSCOPY;  Surgeon: Eloise Harman, DO;  [facility];  Service: Endoscopy;  Laterality: N/A;  ? HARDWARE REMOVAL Right 03/20/2021  ? Procedure: HARDWARE REMOVAL right hip;  Surgeon: Carole Civil, MD;  Location: [facility];  Service: Orthopedics;  Laterality: Right;  ? HIP PINNING,CANNULATED Right 03/10/2020  ? Procedure: INTERNAL FIXATION RIGHT HIP;  Surgeon: Carole Civil, MD;  Location: [facility];  Service: Orthopedics;  Laterality: Right;  ? ORIF WRIST FRACTURE  12/19/2011  ? Procedure: OPEN REDUCTION INTERNAL FIXATION (ORIF) WRIST FRACTURE;  Surgeon: Carole Civil, MD;  Location: [facility];  Service: Orthopedics;  Laterality: Right;  ? prostate cancer    ? Diagnosed in the 90s  ? TONSILLECTOMY    ? ? ?Social History  ? ?Socioeconomic History  ? Marital status: Widowed  ?  Spouse name: Not on file  ? Number of children: Not on file  ? Years of education: Not on file  ? Highest education level: Not on file  ?Occupational History  ? Occupation: retired  ?  Employer: RETIRED  ?Tobacco Use  ? Smoking status: Never  ? Smokeless tobacco: Never  ?  Vaping Use  ? Vaping Use: Never used  ?Substance and Sexual Activity  ? Alcohol use: No  ? Drug use: No  ? Sexual activity: Not Currently  ?Other Topics Concern  ? Not on file  ?Social History Narrative  ? Is long term resident of [facility]   ? ?Social Determinants of Health  ? ?Financial Resource Strain: Not on file  ?Food Insecurity: Not on file  ?Transportation Needs: Not on file  ?Physical Activity: Not on file  ?Stress: Not on file  ?Social Connections: Not on file  ?Intimate Partner Violence: Not on file  ? ?Family History  ?Problem Relation Age of Onset  ? Cancer Other   ? Heart attack Mother    ? Cancer Brother   ? Heart attack Brother   ? Heart disease Sister   ?     by pass  ? ? ? ? ?VITAL SIGNS ?BP 138/80   Pulse (!) 53   Temp (!) 97.3 ?F (36.3 ?C)   Resp 20   Ht '5\' 9"'$  (1.753 m)   Wt 180 lb 6.4 oz (81.8 kg)   SpO2 98%   BMI 26.64 kg/m?  ? ?Outpatient Encounter Medications as of 10/05/2021  ?Medication Sig  ? acetaminophen (TYLENOL) 500 MG tablet Take 500 mg by mouth every 6 (six) hours. Max '3000mg'$  in 24 hour period  ? alfuzosin (UROXATRAL) 10 MG 24 hr tablet Take 10 mg by mouth daily. 6 pm  ? Ascorbic Acid (VITAMIN C) 1000 MG tablet Take 1,000 mg by mouth daily.  ? Balsam Engineer, materials (VENELEX) OINT Apply 1 application topically every 8 (eight) hours as needed (Apply to bilateral buttocks & sacral area qshift for blanchable erythema.).  ? calcium carbonate (TUMS - DOSED IN MG ELEMENTAL CALCIUM) 500 MG chewable tablet Chew 1 tablet by mouth 2 (two) times daily as needed for indigestion or heartburn.   ? diltiazem (CARDIZEM CD) 360 MG 24 hr capsule Take 1 capsule (360 mg total) by mouth daily.  ? Flax Oil-Fish Oil-Borage Oil (FISH OIL-FLAX OIL-BORAGE OIL) CAPS Take 1 capsule by mouth daily.  ? isosorbide mononitrate (IMDUR) 60 MG 24 hr tablet Take 60 mg by mouth daily.  ? lisinopril (ZESTRIL) 20 MG tablet Take 20 mg by mouth daily. Hypertensive chronic kidney disease with stage 1 through stage 4 chronic kidney disease, or unspecified chronic kidney disease  ? loperamide (IMODIUM A-D) 2 MG tablet Take 2 mg by mouth as needed (after each stool. (do not exceed more than 4 tabs in 24 hours)).  ? loratadine (CLARITIN) 10 MG tablet Take 10 mg by mouth daily.  ? Melatonin 10 MG TABS Take 10 mg by mouth at bedtime.  ? meloxicam (MOBIC) 7.5 MG tablet Take 7.5 mg by mouth daily. give for left hip pain  ? memantine (NAMENDA) 10 MG tablet Take 10 mg by mouth 2 (two) times daily.  ? methocarbamol (ROBAXIN) 500 MG tablet Take 500 mg by mouth daily.  ? metoprolol tartrate (LOPRESSOR) 25 MG tablet Take 1 tablet  (25 mg total) by mouth 2 (two) times daily.  ? omeprazole (PRILOSEC) 20 MG capsule Take 1 capsule (20 mg total) by mouth daily before breakfast.  ? ondansetron (ZOFRAN) 8 MG tablet Take 8 mg by mouth as needed for nausea.  ? OXYGEN Inhale 2 L into the lungs as needed (To keep O2 ststa > 90 %).  ? polyethylene glycol (MIRALAX / GLYCOLAX) 17 g packet Take 17 g by mouth daily. On Tuesday, Thursday, and  Saturday  ? potassium chloride SA (KLOR-CON) 20 MEQ tablet Take 20 mEq by mouth 2 (two) times daily.  ? QUEtiapine (SEROQUEL) 25 MG tablet Take 12.5 mg by mouth 2 (two) times daily.  ? rOPINIRole (REQUIP) 0.25 MG tablet Take 0.25 mg by mouth at bedtime.  ? senna-docusate (SENOKOT-S) 8.6-50 MG tablet Take 1 tablet by mouth at bedtime as needed for mild constipation.  ? simethicone (MYLICON) 481 MG chewable tablet Chew 125 mg by mouth 3 (three) times daily as needed ("increased gas").  ? UNABLE TO FIND Diet:Regular  ? venlafaxine XR (EFFEXOR-XR) 150 MG 24 hr capsule Take 150 mg by mouth daily with breakfast.  ? Vibegron (GEMTESA) 75 MG TABS Take 1 tablet by mouth daily as needed.  ? vitamin B-12 (CYANOCOBALAMIN) 1000 MCG tablet Take 1,000 mcg by mouth daily. for vitamin B 12 level 205  ? [DISCONTINUED] Ergocalciferol (VITAMIN D2 PO) 1,250 mcg (50,000 unit); oral ?Special Instructions: Once A Day Every 7 Daysx 2 doses.  ? [DISCONTINUED] predniSONE (DELTASONE) 20 MG tablet Take 20 mg by mouth in the morning and at bedtime.  ? ?No facility-administered encounter medications on file as of 10/05/2021.  ? ? ? ?SIGNIFICANT DIAGNOSTIC EXAMS ? ?PREVIOUS  ? ?09-10-21: chest x-ray: patch asymmetric inflammatory changes. These could represent areas of atypical infection; focal atelectasis infarction or evolving consolidation among other possibilities. ? ?NO NEW EXAMS.   ? ? ?LABS REVIEWED PREVIOUS   ? ?10-04-20: albumin 3.2  ?10-19-20: PSA 12.25 (13.05)  ?12-21-20: wbc 4.6; hgb 13.4 hct 40.9 mcv 86.1 plt 223; glucose 104; bun 22; creat  0.89; k+ 3.0; na++ 143; ca 9.3 GFR>60; liver normal albumin 3.3; chol 128; ldl 71; trig 85 hdl 40   ?12-25-20: k+ 3.2 ?01-01-21: k+ 3.2 ?01-08-21: k+ 4.5 ?01-19-21: glucose 108; bun 20; creat 0.96; k+ 3.6; na++ 139; ca 9

## 2021-10-09 DIAGNOSIS — F419 Anxiety disorder, unspecified: Secondary | ICD-10-CM | POA: Diagnosis not present

## 2021-10-09 DIAGNOSIS — F331 Major depressive disorder, recurrent, moderate: Secondary | ICD-10-CM | POA: Diagnosis not present

## 2021-10-10 DIAGNOSIS — F331 Major depressive disorder, recurrent, moderate: Secondary | ICD-10-CM | POA: Diagnosis not present

## 2021-10-17 ENCOUNTER — Encounter: Payer: Self-pay | Admitting: Internal Medicine

## 2021-10-17 ENCOUNTER — Non-Acute Institutional Stay (SKILLED_NURSING_FACILITY): Payer: Medicare Other | Admitting: Internal Medicine

## 2021-10-17 DIAGNOSIS — R109 Unspecified abdominal pain: Secondary | ICD-10-CM | POA: Diagnosis not present

## 2021-10-17 DIAGNOSIS — R1033 Periumbilical pain: Secondary | ICD-10-CM

## 2021-10-17 DIAGNOSIS — N1832 Chronic kidney disease, stage 3b: Secondary | ICD-10-CM | POA: Diagnosis not present

## 2021-10-17 DIAGNOSIS — D508 Other iron deficiency anemias: Secondary | ICD-10-CM | POA: Diagnosis not present

## 2021-10-17 DIAGNOSIS — R7989 Other specified abnormal findings of blood chemistry: Secondary | ICD-10-CM

## 2021-10-17 DIAGNOSIS — I10 Essential (primary) hypertension: Secondary | ICD-10-CM | POA: Diagnosis not present

## 2021-10-17 DIAGNOSIS — F331 Major depressive disorder, recurrent, moderate: Secondary | ICD-10-CM | POA: Diagnosis not present

## 2021-10-17 NOTE — Assessment & Plan Note (Signed)
He tested positive for COVID in early April.  Renal function was not significantly impacted as creatinine was 1.20 and GFR 58 indicating high stage IIIa CKD.  No change in medications necessary unless there is progression of CKD. ?

## 2021-10-17 NOTE — Assessment & Plan Note (Signed)
Current TSH is therapeutic at 0.620.  No change indicated. ?

## 2021-10-17 NOTE — Assessment & Plan Note (Signed)
>>  ASSESSMENT AND PLAN FOR CHRONIC KIDNEY DISEASE, STAGE II (MILD) WRITTEN ON 10/17/2021 11:52 AM BY HOPPER, Titus Dubin, MD  He tested positive for COVID in early April.  Renal function was not significantly impacted as creatinine was 1.20 and GFR 58 indicating high stage IIIa CKD.  No change in medications necessary unless there is progression of CKD.

## 2021-10-17 NOTE — Assessment & Plan Note (Signed)
See 10/17/2021 throbbing almost constant abdominal pain from the navel laterally to the right over 2-3 weeks.  Validates heartburn despite PPI.  Stool dark which he relates to medications. ?Discussed with NP; labs (CBC, lipase, LFTs) and imaging will be performed. ?

## 2021-10-17 NOTE — Progress Notes (Signed)
? ?NURSING HOME LOCATION:  [place] ?ROOM NUMBER:  156 D ? ?CODE STATUS:  DNR ? ?PCP: Ok Edwards NP,PSC ? ?This is a nursing facility follow up visit of chronic medical diagnoses & to document compliance with Regulation 483.30 (c) in [employer] [place] Phase [employer] which mandates caregiver visit ( visits can alternate among physician, PA or NP as per statutes) within 10 days of 30 days / 60 days/ 90 days post admission to SNF date   ? ?Interim medical record and care since last SNF visit was updated with review of diagnostic studies and change in clinical status since last visit were documented. ? ?HPI: He is a permanent resident of facility with medical diagnoses of history of prostate cancer, essential hypertension, general anxiety disorder, GERD, dyslipidemia, interstitial lung disease, history of peptic ulcer disease, and restless leg syndrome. ? ?Most recent labs were performed 09/10/2021 because of positive COVID screening .C-reactive protein was elevated at 6.7 but subsequently returned to normal at less than 0.5.  D-dimer was normal at 0.43.  He received oral antiviral as per protocol.  Mild anemia was present with H/H of 12.8/38.5; serially H/H stable to improved.  Creatinine 1.20 and GFR 58 indicating CKD stage IIIa. ?TSH is current and therapeutic with a value of 0.620. ? ?Review of systems: When I entered the room he appeared to be asleep but roused easily.  He began to tell me about abdominal pain present for the last 2 to 3 weeks.  It is described as throbbing and persistent from the umbilicus laterally to the right.  As he described this he seemed to have trouble with word retrieval.  He described it as external but inferred it was related to his colon.  He had difficulty retrieving the word "colon".  He also describes nausea without vomiting.  He validates heartburn. He describes the stools as black which he relates to medications but does not specifically mention  iron.  He states that he is "constantly nervous". ?He also describes vague chest pain inferiorly more so on the right than the left which is nonexertional. ? ?Constitutional: No fever, significant weight change  ?Eyes: No redness, discharge, pain, vision change ?ENT/mouth: No nasal congestion,  purulent discharge, earache, change in hearing, sore throat  ?Cardiovascular: No palpitations, paroxysmal nocturnal dyspnea, claudication, edema  ?Respiratory: No cough, sputum production, hemoptysis, DOE, significant snoring, apnea   ?Gastrointestinal: No dysphagia, abdominal pain, rectal bleeding ?Genitourinary: No dysuria, hematuria, pyuria, incontinence, nocturia ?Musculoskeletal: No joint stiffness, joint swelling, weakness, pain ?Dermatologic: No rash, pruritus, change in appearance of skin ?Neurologic: No dizziness, headache, syncope, seizures, numbness, tingling ?Psychiatric: No significant insomnia, anorexia ?Endocrine: No change in hair/skin/nails, excessive thirst, excessive hunger, excessive urination  ?Hematologic/lymphatic: No significant bruising, lymphadenopathy, abnormal bleeding ? ?Physical exam:  ?Pertinent or positive findings: As noted it was after noon, yet he was still in bed asleep.  Pattern alopecia is present.  Eyebrows are essentially absent.  He has erythematous, nonblanching circular areas on each cheek which he relates to scratching himself.  He is edentulous except for a few lower anterior mandibular teeth.  Heart sounds are markedly distant heard best at the apex but still decreased significantly.  Breath sounds are also decreased.  There is dullness to percussion in the left abdomen but clinically I cannot define splenomegaly.  Posterior tibial pulses are stronger than the dorsalis pedis pulses.  He has trace edema at the sock line. ? ?General appearance: Adequately nourished; no  acute distress, increased work of breathing is present.   ?Lymphatic: No lymphadenopathy about the head, neck,  axilla. ?Eyes: No conjunctival inflammation or lid edema is present. There is no scleral icterus. ?Ears:  External ear exam shows no significant lesions or deformities.   ?Nose:  External nasal examination shows no deformity or inflammation. Nasal mucosa are pink and moist without lesions, exudates ?Neck:  No thyromegaly, masses, tenderness noted.    ?Heart:  No gallop, murmur, click, rub .  ?Lungs:  without wheezes, rhonchi, rales, rubs. ?Abdomen: Bowel sounds are normal. Abdomen is soft and nontender with no organomegaly, hernias, masses. ?GU: Deferred  ?Extremities:  No cyanosis, clubbing  ?Neurologic exam :Balance, Rhomberg, finger to nose testing could not be completed due to clinical state ?Skin: Warm & dry w/o tenting. ?No significant rash. ? ?See summary under each active problem in the Problem List with associated updated therapeutic plan ? ? ?

## 2021-10-17 NOTE — Assessment & Plan Note (Addendum)
Blood pressure record reviewed.  Lowest blood pressure recorded was 120/58 with high of 184/80 . Total of 28 blood pressures reviewed; 15 documented systolics over 444. ?He is on high-dose rate limiting calcium channel blocker, normal dose of ACE inhibitor, and low-dose beta-blocker.  Addition of hydralazine will be discussed with [facility] NP. ?

## 2021-10-17 NOTE — Assessment & Plan Note (Signed)
Serially H/H has improved with current values of 12.8/38.5.  No bleeding dyscrasias reported by staff. ?

## 2021-10-17 NOTE — Patient Instructions (Signed)
See assessment and plan under each diagnosis in the problem list and acutely for this visit 

## 2021-10-18 ENCOUNTER — Other Ambulatory Visit (HOSPITAL_COMMUNITY)
Admission: RE | Admit: 2021-10-18 | Discharge: 2021-10-18 | Disposition: A | Discharge status: Home / Self Care | Payer: Medicare Other | Source: Skilled Nursing Facility | Attending: Adult Health | Admitting: Adult Health

## 2021-10-18 DIAGNOSIS — I129 Hypertensive chronic kidney disease with stage 1 through stage 4 chronic kidney disease, or unspecified chronic kidney disease: Secondary | ICD-10-CM | POA: Insufficient documentation

## 2021-10-18 LAB — CBC WITH DIFFERENTIAL/PLATELET
Abs Immature Granulocytes: 0.03 10*3/uL (ref 0.00–0.07)
Basophils Absolute: 0 10*3/uL (ref 0.0–0.1)
Basophils Relative: 1 %
Eosinophils Absolute: 0.1 10*3/uL (ref 0.0–0.5)
Eosinophils Relative: 2 %
HCT: 41.5 % (ref 39.0–52.0)
Hemoglobin: 13.3 g/dL (ref 13.0–17.0)
Immature Granulocytes: 1 %
Lymphocytes Relative: 39 %
Lymphs Abs: 2.4 10*3/uL (ref 0.7–4.0)
MCH: 28.2 pg (ref 26.0–34.0)
MCHC: 32 g/dL (ref 30.0–36.0)
MCV: 87.9 fL (ref 80.0–100.0)
Monocytes Absolute: 0.7 10*3/uL (ref 0.1–1.0)
Monocytes Relative: 11 %
Neutro Abs: 2.9 10*3/uL (ref 1.7–7.7)
Neutrophils Relative %: 46 %
Platelets: 190 10*3/uL (ref 150–400)
RBC: 4.72 MIL/uL (ref 4.22–5.81)
RDW: 14 % (ref 11.5–15.5)
WBC: 6.1 10*3/uL (ref 4.0–10.5)
nRBC: 0 % (ref 0.0–0.2)

## 2021-10-18 LAB — COMPREHENSIVE METABOLIC PANEL
ALT: 14 U/L (ref 0–44)
AST: 17 U/L (ref 15–41)
Albumin: 3.5 g/dL (ref 3.5–5.0)
Alkaline Phosphatase: 70 U/L (ref 38–126)
Anion gap: 6 (ref 5–15)
BUN: 25 mg/dL — ABNORMAL HIGH (ref 8–23)
CO2: 27 mmol/L (ref 22–32)
Calcium: 9.3 mg/dL (ref 8.9–10.3)
Chloride: 109 mmol/L (ref 98–111)
Creatinine, Ser: 0.95 mg/dL (ref 0.61–1.24)
GFR, Estimated: >60 mL/min (ref >60)
Glucose, Bld: 91 mg/dL (ref 70–99)
Potassium: 3.4 mmol/L — ABNORMAL LOW (ref 3.5–5.1)
Sodium: 142 mmol/L (ref 135–145)
Total Bilirubin: 0.5 mg/dL (ref 0.3–1.2)
Total Protein: 6 g/dL — ABNORMAL LOW (ref 6.5–8.1)

## 2021-10-18 LAB — LIPASE, BLOOD: Lipase: 30 U/L (ref 11–51)

## 2021-10-24 DIAGNOSIS — F331 Major depressive disorder, recurrent, moderate: Secondary | ICD-10-CM | POA: Diagnosis not present

## 2021-10-25 DIAGNOSIS — M25532 Pain in left wrist: Secondary | ICD-10-CM | POA: Diagnosis not present

## 2021-10-25 DIAGNOSIS — R2232 Localized swelling, mass and lump, left upper limb: Secondary | ICD-10-CM | POA: Diagnosis not present

## 2021-10-25 DIAGNOSIS — W19XXXA Unspecified fall, initial encounter: Secondary | ICD-10-CM | POA: Diagnosis not present

## 2021-10-25 DIAGNOSIS — R2241 Localized swelling, mass and lump, right lower limb: Secondary | ICD-10-CM | POA: Diagnosis not present

## 2021-10-25 DIAGNOSIS — M25571 Pain in right ankle and joints of right foot: Secondary | ICD-10-CM | POA: Diagnosis not present

## 2021-10-30 DIAGNOSIS — Z23 Encounter for immunization: Secondary | ICD-10-CM | POA: Diagnosis not present

## 2021-10-31 DIAGNOSIS — F331 Major depressive disorder, recurrent, moderate: Secondary | ICD-10-CM | POA: Diagnosis not present

## 2021-11-07 DIAGNOSIS — F411 Generalized anxiety disorder: Secondary | ICD-10-CM | POA: Diagnosis not present

## 2021-11-07 DIAGNOSIS — F331 Major depressive disorder, recurrent, moderate: Secondary | ICD-10-CM | POA: Diagnosis not present

## 2021-11-07 DIAGNOSIS — F321 Major depressive disorder, single episode, moderate: Secondary | ICD-10-CM | POA: Diagnosis not present

## 2021-11-07 DIAGNOSIS — G3184 Mild cognitive impairment, so stated: Secondary | ICD-10-CM | POA: Diagnosis not present

## 2021-11-08 DIAGNOSIS — N451 Epididymitis: Secondary | ICD-10-CM

## 2021-11-08 HISTORY — DX: Epididymitis: N45.1

## 2021-11-14 DIAGNOSIS — H2513 Age-related nuclear cataract, bilateral: Secondary | ICD-10-CM | POA: Diagnosis not present

## 2021-11-14 DIAGNOSIS — H43813 Vitreous degeneration, bilateral: Secondary | ICD-10-CM | POA: Diagnosis not present

## 2021-11-14 DIAGNOSIS — H524 Presbyopia: Secondary | ICD-10-CM | POA: Diagnosis not present

## 2021-11-14 DIAGNOSIS — F331 Major depressive disorder, recurrent, moderate: Secondary | ICD-10-CM | POA: Diagnosis not present

## 2021-11-15 ENCOUNTER — Non-Acute Institutional Stay (SKILLED_NURSING_FACILITY): Payer: Medicare Other | Admitting: Adult Health

## 2021-11-15 ENCOUNTER — Encounter: Payer: Self-pay | Admitting: Adult Health

## 2021-11-15 DIAGNOSIS — N5089 Other specified disorders of the male genital organs: Secondary | ICD-10-CM | POA: Diagnosis not present

## 2021-11-15 DIAGNOSIS — N1832 Chronic kidney disease, stage 3b: Secondary | ICD-10-CM | POA: Diagnosis not present

## 2021-11-15 DIAGNOSIS — E785 Hyperlipidemia, unspecified: Secondary | ICD-10-CM

## 2021-11-15 DIAGNOSIS — N50811 Right testicular pain: Secondary | ICD-10-CM | POA: Diagnosis not present

## 2021-11-15 DIAGNOSIS — N50812 Left testicular pain: Secondary | ICD-10-CM | POA: Diagnosis not present

## 2021-11-15 DIAGNOSIS — F339 Major depressive disorder, recurrent, unspecified: Secondary | ICD-10-CM

## 2021-11-15 DIAGNOSIS — K5909 Other constipation: Secondary | ICD-10-CM

## 2021-11-15 DIAGNOSIS — N433 Hydrocele, unspecified: Secondary | ICD-10-CM | POA: Diagnosis not present

## 2021-11-15 NOTE — Progress Notes (Unsigned)
Location:  [facility] Room Number: 156-D Place of Service:  SNF (31)   CODE STATUS: DNR  Allergies  Allergen Reactions   Hctz [Hydrochlorothiazide]     hypokalemia   Xanax [Alprazolam] Other (See Comments)    Causes hallucinations     Chief Complaint  Patient presents with   Medical Management of Chronic Issues                    Dyslipidemia:  Chronic constipation:  CKD stage 3b; Major depression recurrent chronic:    HPI:  He is a 88 year old long term resident of this facility being seen for the management of her chronic illnesses:  Dyslipidemia:  Chronic constipation:  CKD stage 3b; Major depression recurrent chronic. He is presently not being seen by therapy. He denies any uncontrolled pain. He states that his appetite is good. Denies any depressive or anxious thoughts.    Past Medical History:  Diagnosis Date   Anxiety    Cancer [facility])    prostate   Chronic chest wall pain    Essential hypertension, benign 06/20/2016   GAD (generalized anxiety disorder)    GERD (gastroesophageal reflux disease)    H/O echocardiogram 2016   normal   Heart murmur    "leaking heart valve"   High cholesterol 06/20/2016   HOH (hard of hearing)    left   Hx of falling    Interstitial pulmonary disease (HCC)    Lower GI bleed    MDD (major depressive disorder)    Murmur, cardiac    Poor historian    PUD (peptic ulcer disease)    RLS (restless legs syndrome)    Shortness of breath    Wrist fracture 12/16/2011    Past Surgical History:  Procedure Laterality Date   CHOLECYSTECTOMY N/A 06/28/2020   Procedure: LAPAROSCOPIC CHOLECYSTECTOMY;  Surgeon: [name], MD;  Location: [facility] ORS;  Service: General;  Laterality: N/A;  pt in [place]  2005   Rehman: Sigmoid colon diverticulosis, moderate sized external hemorrhoids. A few focal areas of mucosal erythema felt to be nonspecific.   ESOPHAGOGASTRODUODENOSCOPY      ESOPHAGOGASTRODUODENOSCOPY N/A 07/18/2016   Rehman: normal exam except duodenal scar. h.pylori serologies negative   FLEXIBLE SIGMOIDOSCOPY N/A 06/11/2020   Procedure: FLEXIBLE SIGMOIDOSCOPY;  Surgeon: Eloise Harman, DO;  Location: [facility] ENDO SU[facility];  Service: Endoscopy;  Laterality: N/A;   HARDWARE REMOVAL Right 03/20/2021   Procedure: HARDWARE REMOVAL right hip;  Surgeon: Carole Civil, MD;  Location: [facility] ORS;  Service: Orthopedics;  Laterality: Right;   HIP PINNING,CANNULATED Right 03/10/2020   Procedure: INTERNAL FIXATION RIGHT HIP;  Surgeon: Carole Civil, MD;  Location: [facility];  Service: Orthopedics;  Laterality: Right;   ORIF WRIST FRACTURE  12/19/2011   Procedure: OPEN REDUCTION INTERNAL FIXATION (ORIF) WRIST FRACTURE;  Surgeon: Carole Civil, MD;  Location: [facility];  Service: Orthopedics;  Laterality: Right;   prostate cancer     Diagnosed in the 90s   TONSILLECTOMY      Social History   Socioeconomic History   Marital status: Widowed    Spouse name: Not on file   Number of children: Not on file   Years of education: Not on file   Highest education level: Not on file  Occupational History   Occupation: retired    Fish farm manager: RETIRED  Tobacco Use   Smoking status: Never   Smokeless tobacco: Never  Vaping  Use   Vaping Use: Never used  Substance and Sexual Activity   Alcohol use: No   Drug use: No   Sexual activity: Not Currently  Other Topics Concern   Not on file  Social History Narrative   Is long term resident of [facility]    Social Determinants of Health   Financial Resource Strain: Not on file  Food Insecurity: Not on file  Transportation Needs: Not on file  Physical Activity: Not on file  Stress: Not on file  Social Connections: Not on file  Intimate Partner Violence: Not on file   Family History  Problem Relation Age of Onset   Cancer Other    Heart attack Mother    Cancer Brother    Heart attack Brother    Heart disease Sister        by pass       VITAL SIGNS BP (!) 157/77   Pulse 98   Temp 97.6 F (36.4 C)   Resp (!) 22   Ht '5\' 9"'$  (1.753 m)   Wt 178 lb 3.2 oz (80.8 kg)   SpO2 98%   BMI 26.32 kg/m   Outpatient Encounter Medications as of 11/15/2021  Medication Sig   acetaminophen (TYLENOL) 500 MG tablet Take 500 mg by mouth every 6 (six) hours. Max '3000mg'$  in 24 hour period   alfuzosin (UROXATRAL) 10 MG 24 hr tablet Take 10 mg by mouth daily. 6 pm   Ascorbic Acid (VITAMIN C) 1000 MG tablet Take 1,000 mg by mouth daily.   Balsam [place]-Castor Oil (VENELEX) OINT Apply 1 application topically every 8 (eight) hours as needed (Apply to bilateral buttocks & sacral area qshift for blanchable erythema.).   calcium carbonate (TUMS - DOSED IN MG ELEMENTAL CALCIUM) 500 MG chewable tablet Chew 1 tablet by mouth 2 (two) times daily as needed for indigestion or heartburn.    diltiazem (CARDIZEM CD) 360 MG 24 hr capsule Take 1 capsule (360 mg total) by mouth daily.   Flax Oil-Fish Oil-Borage Oil (FISH OIL-FLAX OIL-BORAGE OIL) CAPS Take 1 capsule by mouth daily.   hydrALAZINE (APRESOLINE) 10 MG tablet Take 10 mg by mouth 3 (three) times daily. Hypertensive chronic kidney disease with stage 1 through stage 4 chronic kidney disease, or unspecified chronic kidney disease   isosorbide mononitrate (IMDUR) 60 MG 24 hr tablet Take 60 mg by mouth daily.   lisinopril (ZESTRIL) 20 MG tablet Take 20 mg by mouth daily. Hypertensive chronic kidney disease with stage 1 through stage 4 chronic kidney disease, or unspecified chronic kidney disease   loperamide (IMODIUM A-D) 2 MG tablet Take 2 mg by mouth as needed (after each stool. (do not exceed more than 4 tabs in 24 hours)).   loratadine (CLARITIN) 10 MG tablet Take 10 mg by mouth daily.   Melatonin 10 MG TABS Take 10 mg by mouth at bedtime.   meloxicam (MOBIC) 7.5 MG tablet Take 7.5 mg by mouth daily. give for left hip pain   memantine (NAMENDA) 10 MG tablet Take 10 mg by mouth 2 (two) times daily.    methocarbamol (ROBAXIN) 500 MG tablet Take 500 mg by mouth daily.   metoprolol tartrate (LOPRESSOR) 25 MG tablet Take 1 tablet (25 mg total) by mouth 2 (two) times daily.   omeprazole (PRILOSEC) 20 MG capsule Take 1 capsule (20 mg total) by mouth daily before breakfast.   ondansetron (ZOFRAN) 8 MG tablet Take 8 mg by mouth as needed for nausea.   OXYGEN Inhale 2  L into the lungs as needed (To keep O2 ststa > 90 %).   polyethylene glycol (MIRALAX / GLYCOLAX) 17 g packet Take 17 g by mouth daily. On Tuesday, Thursday, and Saturday   potassium chloride SA (KLOR-CON) 20 MEQ tablet Take 20 mEq by mouth 2 (two) times daily.   QUEtiapine (SEROQUEL) 25 MG tablet Take 12.5 mg by mouth 2 (two) times daily.   rOPINIRole (REQUIP) 0.25 MG tablet Take 0.25 mg by mouth at bedtime.   senna-docusate (SENOKOT-S) 8.6-50 MG tablet Take 1 tablet by mouth at bedtime as needed for mild constipation.   simethicone (MYLICON) 409 MG chewable tablet Chew 125 mg by mouth 3 (three) times daily as needed ("increased gas").   UNABLE TO FIND Diet:Regular   venlafaxine XR (EFFEXOR-XR) 150 MG 24 hr capsule Take 150 mg by mouth daily with breakfast.   Vibegron (GEMTESA) 75 MG TABS Take 1 tablet by mouth daily as needed.   vitamin B-12 (CYANOCOBALAMIN) 1000 MCG tablet Take 1,000 mcg by mouth daily. for vitamin B 12 level 205   No facility-administered encounter medications on file as of 11/15/2021.     SIGNIFICANT DIAGNOSTIC EXAMS  PREVIOUS   09-10-21: chest x-ray: patch asymmetric inflammatory changes. These could represent areas of atypical infection; focal atelectasis infarction or evolving consolidation among other possibilities.  NO NEW EXAMS.     LABS REVIEWED PREVIOUS     12-21-20: wbc 4.6; hgb 13.4 hct 40.9 mcv 86.1 plt 223; glucose 104; bun 22; creat 0.89; k+ 3.0; na++ 143; ca 9.3 GFR>60; liver normal albumin 3.3; chol 128; ldl 71; trig 85 hdl 40   12-25-20: k+ 3.2 01-01-21: k+ 3.2 01-08-21: k+ 4.5 01-19-21: glucose  108; bun 20; creat 0.96; k+ 3.6; na++ 139; ca 9.0 GFR>60 02-05-21: glucose 109; bun 25; creat 1.05; k+ 4.5; na++ 141; ca 9.5; GFR>60.  03-13-21: wbc 7.3; hgb 14.0; hct 42.5; mcv 85.5 plt 212; glucose 126; bun 24; creat 1.03 ;k+ 3.9; na++ 139; ca 9.6; GFR>60 03-15-21: hgb a1c 5.7 03-20-21: wbc 20.3; hgb 14.6; hct 44.4; mcv 86.7 plt 229; glcusoe 201; bun 26; creat 1.44; k+ 4.2; na++ 138; ca 9.2; GFR 46  03-22-21: aldosterone 7.6; cortisol AM 19.5;  03-26-21: wbc 6.3; hgb 12.4; hct 37.9; mcv 87.5 plt 230; glucose 117; bun 28; creat 1.13; k+ 4.4; na++ 138; ca 9.2; GFR>60  04-23-21: PSA 0.6; testosterone: <3 08-10-21: PSA 0.17 testosterone <3 08-16-21: wbc 6.0; hgb 12.0; hct 37.1; mcv 89.0 plt 181; glucose 98; bun 40; creat 1.46; k+ 5.7; na++ 138; ca 9.3 GFR>46 protein 5.8; albumin 3.4; tsh 0.620 08-24-21: glucose 104; bun 46; creat 2.07; k+ 4.7; na++ 141; ca 8.5; GFR 30  08-30-21: vitamin B12  205  09-10-21: wbc 8.5; hgb 12.8; hct 38.5; mcv 87.3; plt 167; glucose 131; bun 25; creat 1.20; k+ 3.9; na++ 140; ca 9.6; GFR 58; iron 38; tibc 261; folate 14.2; CRP 6.7; d-dimer 0.43  TODAY  09-17-21: CRP <0.5 10-18-21: wbc 6.1; hgb 13.3; hct 41.5; mcv 89.7 plt 190; glucose 91; bun 25; creat 0.95; k+ 3.4; na++ 142; ca 9.3; gfr >60; protein 6.0; albumin 3.5   Review of Systems  Constitutional:  Negative for malaise/fatigue.  Respiratory:  Negative for cough and shortness of breath.   Cardiovascular:  Negative for chest pain, palpitations and leg swelling.  Gastrointestinal:  Negative for abdominal pain, constipation and heartburn.  Musculoskeletal:  Negative for back pain, joint pain and myalgias.  Skin: Negative.   Neurological:  Negative for dizziness.  Psychiatric/Behavioral:  The patient is not nervous/anxious.    Physical Exam Constitutional:      General: He is not in acute distress.    Appearance: He is well-developed. He is not diaphoretic.  Neck:     Thyroid: No thyromegaly.  Cardiovascular:      Rate and Rhythm: Normal rate and regular rhythm.     Pulses: Normal pulses.     Heart sounds: Normal heart sounds.  Pulmonary:     Effort: Pulmonary effort is normal. No respiratory distress.     Breath sounds: Normal breath sounds.  Abdominal:     General: Bowel sounds are normal. There is no distension.     Palpations: Abdomen is soft.     Tenderness: There is no abdominal tenderness.  Musculoskeletal:        General: Normal range of motion.     Cervical back: Neck supple.     Right lower leg: No edema.     Left lower leg: No edema.  Lymphadenopathy:     Cervical: No cervical adenopathy.  Skin:    General: Skin is warm and dry.  Neurological:     Mental Status: He is alert and oriented to person, place, and time.  Psychiatric:        Mood and Affect: Mood normal.      ASSESSMENT/ PLAN:  TODAY  Dyslipidemia: is stable is off zocor will continue fish oil 1 gm daily   2. Chronic constipation: will continue miralax three times weekly; and and senna s as needed  3. CKD stage 3b; is stable bun 25; creat 0.95; gfr>60  4. Major depression recurrent chronic: will continue effexor xr 150 mg daily melatonin 10 mg nightly and seroquel 12.5 mg twice daily    PREVIOUS   5. RLS: is stable will continue requip 0.25 mg nightly   6. Iron deficiency anemia due to dietary intake: is stable hgb 13.3 off iron    7. Interstitial long disease: has duoneb every 6 hours as needed; has prn 02 claritin 10 mg daily   8. Vitamin B 12 deficiency: level 205 will continue 1000 mcg daily   9. Left hip pain: has had hardware removed in past: will continue mobic 7.5 mg daily robaxin 500 mg daily; pain is currently managed.   10. Dementia without behavioral disturbance unspecified dementia type: is without change weight is 178 pounds; will continue namenda 10 mg twice daily   11. Aortic atherosclerosis: (ct 06-09-20) will monitor is on asa 81 mg daily   12. Protein calorie malnutrition: albumin  3.5 will monitor   13. Benign hypertension with chronic kidney disease stage III stable b/p 155/77 will continue lopressor 25 mg twice daily cardizem cd 360 mg daily lisinopril 20 mg daily   14. Gastroesophageal reflux disease without esophagitis: will continue prilosec 20 mg daily   15. Prostate caner/over active bladder; will continue urostral 10 mg daily; gemtesa 75 mg daily as needed: no oxybutynin due to history of fracture; off myebretriq due to hypertension  16. Hypokalemia k+ 3.4 will continue k+ 20 meq twice daily    Ok Edwards NP [facility]   call [phone]

## 2021-11-16 ENCOUNTER — Encounter: Payer: Self-pay | Admitting: Adult Health

## 2021-11-16 ENCOUNTER — Non-Acute Institutional Stay (SKILLED_NURSING_FACILITY): Payer: Medicare Other | Admitting: Adult Health

## 2021-11-16 DIAGNOSIS — N451 Epididymitis: Secondary | ICD-10-CM | POA: Diagnosis not present

## 2021-11-16 NOTE — Progress Notes (Unsigned)
Location:  [place] Room Number: 156-D Place of Service:  SNF (31)   CODE STATUS: DNR  Allergies  Allergen Reactions   Hctz [Hydrochlorothiazide]     hypokalemia   Xanax [Alprazolam] Other (See Comments)    Causes hallucinations     Chief Complaint  Patient presents with   Acute Visit    Epididymitis     HPI:  He is telling his family and staff that his testicles are hurting. He has told his family that he wants them "cut off". He tells me today that they hurt all the time and that the pain is sharpe in nature. There are no reports of fevers present. There is some swelling present.   Past Medical History:  Diagnosis Date   Anxiety    Cancer [facility])    prostate   Chronic chest wall pain    Essential hypertension, benign 06/20/2016   GAD (generalized anxiety disorder)    GERD (gastroesophageal reflux disease)    H/O echocardiogram 2016   normal   Heart murmur    "leaking heart valve"   High cholesterol 06/20/2016   HOH (hard of hearing)    left   Hx of falling    Interstitial pulmonary disease (HCC)    Lower GI bleed    MDD (major depressive disorder)    Murmur, cardiac    Poor historian    PUD (peptic ulcer disease)    RLS (restless legs syndrome)    Shortness of breath    Wrist fracture 12/16/2011    Past Surgical History:  Procedure Laterality Date   CHOLECYSTECTOMY N/A 06/28/2020   Procedure: LAPAROSCOPIC CHOLECYSTECTOMY;  Surgeon: [name], MD;  Location: [facility];  Service: General;  Laterality: N/A;  pt in [place]  2005   Rehman: Sigmoid colon diverticulosis, moderate sized external hemorrhoids. A few focal areas of mucosal erythema felt to be nonspecific.   ESOPHAGOGASTRODUODENOSCOPY     ESOPHAGOGASTRODUODENOSCOPY N/A 07/18/2016   Rehman: normal exam except duodenal scar. h.pylori serologies negative   FLEXIBLE SIGMOIDOSCOPY N/A 06/11/2020   Procedure: FLEXIBLE SIGMOIDOSCOPY;  Surgeon: Eloise Harman,  DO;  Location[facility];  Service: Endoscopy;  Laterality: N/A;   HARDWARE REMOVAL Right 03/20/2021   Procedure: HARDWARE REMOVAL right hip;  Surgeon: Carole Civil, MD;  Location: [facility];  Service: Orthopedics;  Laterality: Right;   HIP PINNING,CANNULATED Right 03/10/2020   Procedure: INTERNAL FIXATION RIGHT HIP;  Surgeon: Carole Civil, MD;  Location: [facility];  Service: Orthopedics;  Laterality: Right;   ORIF WRIST FRACTURE  12/19/2011   Procedure: OPEN REDUCTION INTERNAL FIXATION (ORIF) WRIST FRACTURE;  Surgeon: Carole Civil, MD;  Location: [facility];  Service: Orthopedics;  Laterality: Right;   prostate cancer     Diagnosed in the 90s   TONSILLECTOMY      Social History   Socioeconomic History   Marital status: Widowed    Spouse name: Not on file   Number of children: Not on file   Years of education: Not on file   Highest education level: Not on file  Occupational History   Occupation: retired    Fish farm manager: RETIRED  Tobacco Use   Smoking status: Never   Smokeless tobacco: Never  Vaping Use   Vaping Use: Never used  Substance and Sexual Activity   Alcohol use: No   Drug use: No   Sexual activity: Not Currently  Other Topics Concern   Not on file  Social History Narrative   Is long term resident of [facility]    Social Determinants of Health   Financial Resource Strain: Not on file  Food Insecurity: Not on file  Transportation Needs: Not on file  Physical Activity: Not on file  Stress: Not on file  Social Connections: Not on file  Intimate Partner Violence: Not on file   Family History  Problem Relation Age of Onset   Cancer Other    Heart attack Mother    Cancer Brother    Heart attack Brother    Heart disease Sister        by pass      VITAL SIGNS BP (!) 157/77   Pulse 98   Temp 97.9 F (36.6 C)   Resp (!) 22   Ht '5\' 9"'$  (1.753 m)   Wt 178 lb 3.2 oz (80.8 kg)   SpO2 98%   BMI 26.32 kg/m   Outpatient Encounter Medications as of  11/16/2021  Medication Sig   acetaminophen (TYLENOL) 500 MG tablet Take 500 mg by mouth every 6 (six) hours. Max '3000mg'$  in 24 hour period   alfuzosin (UROXATRAL) 10 MG 24 hr tablet Take 10 mg by mouth daily. 6 pm   Ascorbic Acid (VITAMIN C) 1000 MG tablet Take 1,000 mg by mouth daily.   Balsam [place]-Castor Oil (VENELEX) OINT Apply 1 application topically every 8 (eight) hours as needed (Apply to bilateral buttocks & sacral area qshift for blanchable erythema.).   calcium carbonate (TUMS - DOSED IN MG ELEMENTAL CALCIUM) 500 MG chewable tablet Chew 1 tablet by mouth 2 (two) times daily as needed for indigestion or heartburn.    diltiazem (CARDIZEM CD) 360 MG 24 hr capsule Take 1 capsule (360 mg total) by mouth daily.   Flax Oil-Fish Oil-Borage Oil (FISH OIL-FLAX OIL-BORAGE OIL) CAPS Take 1 capsule by mouth daily.   hydrALAZINE (APRESOLINE) 10 MG tablet Take 10 mg by mouth 3 (three) times daily. Hypertensive chronic kidney disease with stage 1 through stage 4 chronic kidney disease, or unspecified chronic kidney disease   isosorbide mononitrate (IMDUR) 60 MG 24 hr tablet Take 60 mg by mouth daily.   lisinopril (ZESTRIL) 20 MG tablet Take 20 mg by mouth daily. Hypertensive chronic kidney disease with stage 1 through stage 4 chronic kidney disease, or unspecified chronic kidney disease   loperamide (IMODIUM A-D) 2 MG tablet Take 2 mg by mouth as needed (after each stool. (do not exceed more than 4 tabs in 24 hours)).   loratadine (CLARITIN) 10 MG tablet Take 10 mg by mouth daily.   Melatonin 10 MG TABS Take 10 mg by mouth at bedtime.   meloxicam (MOBIC) 7.5 MG tablet Take 7.5 mg by mouth daily. give for left hip pain   memantine (NAMENDA) 10 MG tablet Take 10 mg by mouth 2 (two) times daily.   methocarbamol (ROBAXIN) 500 MG tablet Take 500 mg by mouth daily.   metoprolol tartrate (LOPRESSOR) 25 MG tablet Take 1 tablet (25 mg total) by mouth 2 (two) times daily.   omeprazole (PRILOSEC) 20 MG capsule Take 1  capsule (20 mg total) by mouth daily before breakfast.   ondansetron (ZOFRAN) 8 MG tablet Take 8 mg by mouth as needed for nausea.   OXYGEN Inhale 2 L into the lungs as needed (To keep O2 ststa > 90 %).   polyethylene glycol (MIRALAX / GLYCOLAX) 17 g packet Take 17 g by mouth daily. On Tuesday, Thursday, and Saturday   potassium chloride  SA (KLOR-CON) 20 MEQ tablet Take 20 mEq by mouth 2 (two) times daily.   QUEtiapine (SEROQUEL) 25 MG tablet Take 12.5 mg by mouth 2 (two) times daily.   rOPINIRole (REQUIP) 0.25 MG tablet Take 0.25 mg by mouth at bedtime.   senna-docusate (SENOKOT-S) 8.6-50 MG tablet Take 1 tablet by mouth at bedtime as needed for mild constipation.   simethicone (MYLICON) 086 MG chewable tablet Chew 125 mg by mouth 3 (three) times daily as needed ("increased gas").   UNABLE TO FIND Diet:Regular   venlafaxine XR (EFFEXOR-XR) 150 MG 24 hr capsule Take 150 mg by mouth daily with breakfast.   Vibegron (GEMTESA) 75 MG TABS Take 1 tablet by mouth daily as needed.   vitamin B-12 (CYANOCOBALAMIN) 1000 MCG tablet Take 1,000 mcg by mouth daily. for vitamin B 12 level 205   No facility-administered encounter medications on file as of 11/16/2021.     SIGNIFICANT DIAGNOSTIC EXAMS  PREVIOUS   09-10-21: chest x-ray: patch asymmetric inflammatory changes. These could represent areas of atypical infection; focal atelectasis infarction or evolving consolidation among other possibilities.  TODAY  11-15-21: scrotal ultrasounds No evidence of intratesticular   mass or torsion bilateral  No evidence of varicoceles bilateral  Small sized right hydrocele Bilateral epididymal head is enlarges may represent epididymitis    LABS REVIEWED PREVIOUS     12-21-20: wbc 4.6; hgb 13.4 hct 40.9 mcv 86.1 plt 223; glucose 104; bun 22; creat 0.89; k+ 3.0; na++ 143; ca 9.3 GFR>60; liver normal albumin 3.3; chol 128; ldl 71; trig 85 hdl 40   12-25-20: k+ 3.2 01-01-21: k+ 3.2 01-08-21: k+ 4.5 01-19-21: glucose  108; bun 20; creat 0.96; k+ 3.6; na++ 139; ca 9.0 GFR>60 02-05-21: glucose 109; bun 25; creat 1.05; k+ 4.5; na++ 141; ca 9.5; GFR>60.  03-13-21: wbc 7.3; hgb 14.0; hct 42.5; mcv 85.5 plt 212; glucose 126; bun 24; creat 1.03 ;k+ 3.9; na++ 139; ca 9.6; GFR>60 03-15-21: hgb a1c 5.7 03-20-21: wbc 20.3; hgb 14.6; hct 44.4; mcv 86.7 plt 229; glcusoe 201; bun 26; creat 1.44; k+ 4.2; na++ 138; ca 9.2; GFR 46  03-22-21: aldosterone 7.6; cortisol AM 19.5;  03-26-21: wbc 6.3; hgb 12.4; hct 37.9; mcv 87.5 plt 230; glucose 117; bun 28; creat 1.13; k+ 4.4; na++ 138; ca 9.2; GFR>60  04-23-21: PSA 0.6; testosterone: <3 08-10-21: PSA 0.17 testosterone <3 08-16-21: wbc 6.0; hgb 12.0; hct 37.1; mcv 89.0 plt 181; glucose 98; bun 40; creat 1.46; k+ 5.7; na++ 138; ca 9.3 GFR>46 protein 5.8; albumin 3.4; tsh 0.620 08-24-21: glucose 104; bun 46; creat 2.07; k+ 4.7; na++ 141; ca 8.5; GFR 30  08-30-21: vitamin B12  205  09-10-21: wbc 8.5; hgb 12.8; hct 38.5; mcv 87.3; plt 167; glucose 131; bun 25; creat 1.20; k+ 3.9; na++ 140; ca 9.6; GFR 58; iron 38; tibc 261; folate 14.2; CRP 6.7; d-dimer 0.43  TODAY  09-17-21: CRP <0.5 10-18-21: wbc 6.1; hgb 13.3; hct 41.5; mcv 89.7 plt 190; glucose 91; bun 25; creat 0.95; k+ 3.4; na++ 142; ca 9.3; gfr >60; protein 6.0; albumin 3.5    Review of Systems  Constitutional:  Negative for malaise/fatigue.  Respiratory:  Negative for cough and shortness of breath.   Cardiovascular:  Negative for chest pain, palpitations and leg swelling.  Gastrointestinal:  Negative for abdominal pain, constipation and heartburn.  Genitourinary:        Testicular  pain   Musculoskeletal:  Negative for back pain, joint pain and myalgias.  Skin: Negative.  Neurological:  Negative for dizziness.  Psychiatric/Behavioral:  The patient is not nervous/anxious.    Physical Exam Constitutional:      General: He is not in acute distress.    Appearance: He is well-developed. He is not diaphoretic.  Neck:      Thyroid: No thyromegaly.  Cardiovascular:     Rate and Rhythm: Normal rate and regular rhythm.     Pulses: Normal pulses.     Heart sounds: Normal heart sounds.  Pulmonary:     Effort: Pulmonary effort is normal. No respiratory distress.     Breath sounds: Normal breath sounds.  Abdominal:     General: Bowel sounds are normal. There is no distension.     Palpations: Abdomen is soft.     Tenderness: There is no abdominal tenderness.  Genitourinary:    Comments: Testicles are slightly swollen and tender to palpation  Musculoskeletal:        General: Normal range of motion.     Cervical back: Neck supple.  Lymphadenopathy:     Cervical: No cervical adenopathy.  Skin:    General: Skin is warm and dry.  Neurological:     Mental Status: He is alert and oriented to person, place, and time.  Psychiatric:        Mood and Affect: Mood normal.       ASSESSMENT/ PLAN:  TODAY  Bilateral epididymitis will begin levaquin 500 mg daily through 11-25-21.     Ok Edwards NP [facility]  call [phone]

## 2021-11-20 DIAGNOSIS — F419 Anxiety disorder, unspecified: Secondary | ICD-10-CM | POA: Diagnosis not present

## 2021-11-20 DIAGNOSIS — F331 Major depressive disorder, recurrent, moderate: Secondary | ICD-10-CM | POA: Diagnosis not present

## 2021-11-21 DIAGNOSIS — F331 Major depressive disorder, recurrent, moderate: Secondary | ICD-10-CM | POA: Diagnosis not present

## 2021-11-28 DIAGNOSIS — F331 Major depressive disorder, recurrent, moderate: Secondary | ICD-10-CM | POA: Diagnosis not present

## 2021-12-03 DIAGNOSIS — E1151 Type 2 diabetes mellitus with diabetic peripheral angiopathy without gangrene: Secondary | ICD-10-CM | POA: Diagnosis not present

## 2021-12-03 DIAGNOSIS — Z7984 Long term (current) use of oral hypoglycemic drugs: Secondary | ICD-10-CM | POA: Diagnosis not present

## 2021-12-03 DIAGNOSIS — M2042 Other hammer toe(s) (acquired), left foot: Secondary | ICD-10-CM | POA: Diagnosis not present

## 2021-12-03 DIAGNOSIS — B351 Tinea unguium: Secondary | ICD-10-CM | POA: Diagnosis not present

## 2021-12-05 DIAGNOSIS — F331 Major depressive disorder, recurrent, moderate: Secondary | ICD-10-CM | POA: Diagnosis not present

## 2021-12-05 DIAGNOSIS — G3184 Mild cognitive impairment, so stated: Secondary | ICD-10-CM | POA: Diagnosis not present

## 2021-12-05 DIAGNOSIS — F321 Major depressive disorder, single episode, moderate: Secondary | ICD-10-CM | POA: Diagnosis not present

## 2021-12-05 DIAGNOSIS — F411 Generalized anxiety disorder: Secondary | ICD-10-CM | POA: Diagnosis not present

## 2021-12-12 DIAGNOSIS — F331 Major depressive disorder, recurrent, moderate: Secondary | ICD-10-CM | POA: Diagnosis not present

## 2021-12-17 ENCOUNTER — Ambulatory Visit (INDEPENDENT_AMBULATORY_CARE_PROVIDER_SITE_OTHER): Payer: Medicare Other | Admitting: Physician Assistant

## 2021-12-17 VITALS — BP 145/82 | HR 101

## 2021-12-17 DIAGNOSIS — N3281 Overactive bladder: Secondary | ICD-10-CM

## 2021-12-17 DIAGNOSIS — N401 Enlarged prostate with lower urinary tract symptoms: Secondary | ICD-10-CM | POA: Diagnosis not present

## 2021-12-17 DIAGNOSIS — Z8546 Personal history of malignant neoplasm of prostate: Secondary | ICD-10-CM | POA: Diagnosis not present

## 2021-12-17 DIAGNOSIS — C61 Malignant neoplasm of prostate: Secondary | ICD-10-CM

## 2021-12-17 DIAGNOSIS — K4090 Unilateral inguinal hernia, without obstruction or gangrene, not specified as recurrent: Secondary | ICD-10-CM

## 2021-12-17 DIAGNOSIS — N138 Other obstructive and reflux uropathy: Secondary | ICD-10-CM | POA: Diagnosis not present

## 2021-12-17 DIAGNOSIS — R350 Frequency of micturition: Secondary | ICD-10-CM

## 2021-12-17 DIAGNOSIS — R3915 Urgency of urination: Secondary | ICD-10-CM | POA: Diagnosis not present

## 2021-12-17 LAB — MICROSCOPIC EXAMINATION
Bacteria, UA: NONE SEEN
RBC, Urine: NONE SEEN /hpf (ref 0–2)
Renal Epithel, UA: NONE SEEN /hpf
WBC, UA: NONE SEEN /hpf (ref 0–5)

## 2021-12-17 LAB — URINALYSIS, ROUTINE W REFLEX MICROSCOPIC
Bilirubin, UA: NEGATIVE
Glucose, UA: NEGATIVE
Ketones, UA: NEGATIVE
Leukocytes,UA: NEGATIVE
Nitrite, UA: NEGATIVE
RBC, UA: NEGATIVE
Specific Gravity, UA: 1.02 (ref 1.005–1.030)
Urobilinogen, Ur: 0.2 mg/dL (ref 0.2–1.0)
pH, UA: 5.5 (ref 5.0–7.5)

## 2021-12-17 LAB — BLADDER SCAN AMB NON-IMAGING: Scan Result: 43

## 2021-12-17 NOTE — Progress Notes (Signed)
Assessment: 1. Unilateral inguinal hernia without obstruction or gangrene, recurrence not specified  2. BPH with obstruction/lower urinary tract symptoms - BLADDER SCAN AMB NON-IMAGING - Urinalysis, Routine w reflex microscopic  3. Overactive bladder  4. Urinary frequency  5. Urgency of micturition  6. Prostate cancer [name]    Plan: Gen Surg appt for eval of right inguinal hernia. Continue Gemtesa and Uroxatrol. Discussed PTNS again and pt states he cannot get to the office for treatments. Pt is interested in discussing Urolift further with Dr. Junious Silk. Miralax for constipation.   Chief Complaint: No chief complaint on file.   HPI: Jeffrey Sherman is a 88 y.o. male with history of dementia and with history of  resides at [facility] care who presents for evaluation of right sided inguinal pain. The pt presents with his son who assists with history.  Patient was treated for bilateral epididymitis in June and reports no further symptoms related to that, but he continues to have right-sided inguinal pain which sometimes radiates to the groin.  He reports a history of hernia there and states he is aware that general surgery evaluation is indicated for hernias.  Urologic complaints include persistent urinary frequency and urgency with nocturia 5-6 times per night and significant incontinence.  Patient states he has follow-up with Dr. Junious Silk in December, but he would like to consider any other options for treatment of his incontinence and frequency.  He does not feel like PTNS is feasible because he is unable to get his facility to the office often enough to get through the treatment.  He is interested in discussing UroLift further.  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 He denies burning, dysuria, gross hematuria.  Other complaints include constipation.  Patient states he has not had a BM in more than a week.  Patient is also followed for prostate  cancer and follow-up scheduled for labs in December.  Patient's overall history is somewhat confusing because of his level of dementia. PVR = 43 mL UA = clear IPSS = 20, quality-of-life = 4   11/15/21-[facility] Provider tx pt for scrotal pain and Rx Levaquin 500 QD x 10 days 11-15-21: scrotal ultrasounds No evidence of intratesticular   mass or torsion bilateral  No evidence of varicoceles bilateral  Small sized right hydrocele Bilateral epididymal head is enlarges may represent epididymitis   08/27/21 F/u -   1) BPH with LUTS - urinary frequency despite taking both alfuzosin and terazosin. Off alfuzosin due to dizziness. He does c/o about his urinary frequency.  Pt denies any recent UTIs, significant urinary incontinence, gross hematuria, or blood in his stool. AUASS is 15. Prostate was about 40 grams on CT in 2021. PVR = 100 ml.    Tried Myrbetriq 02/22. Not a lot of improvement. Prostate 27 grams on [place] Nov 2022.  Cystoscopy Nov 2022 with obstruction from lateral lobe hypertrophy.  Moderate bladder trabeculation.   He continues to complain of urinary frequency especially at night.  He can go 3 to 4 hours in the day and only 1 to 2 hours at night. Trial of Gemtesa in Nov 2022. He still has some frequency and UUI.      2) PCa - dx in 2002 with Dr. Serita Butcher with PSA 22.7, GG1 and GG2 (Gleason 3+4=7, 3+3=6). No definitive therapy. On ADT until 2013 when he started IADT. He stopped ADT after a 2015 dose and PSA rose to 28.7 in March 2021 and 24 in  May 2021. He received a 4 month Lupron in May 2021 and PSA did not respond - it remained stable at 24.4 in Aug 2021 with T 32. He was adamant about not restarting ADT until May 2022 when he wanted to restart due to LUTS. His PSA is lower at 16 in Feb 2022 and 12 around May 2022.    Primary treatment: None   Current Tx: IADT    Staging: May 2019: Bone/CT scan both showed no evidence of metastatic disease.  Dec 2021 CT no evidence of mets - PSA 24    In  the past ADT has helped his LUTS. He was given Eligard 45 mg May 2022 at PSA 12.25. His Nov 2022 PSA down to 0.6 with T < 3 and now Mar 2023 PSA 0.17 and T < 3.         Portions of the above documentation were copied from a prior visit for review purposes only.  Allergies: Allergies  Allergen Reactions   Hctz [Hydrochlorothiazide]     hypokalemia   Xanax [Alprazolam] Other (See Comments)    Causes hallucinations     PMH: Past Medical History:  Diagnosis Date   Anxiety    Cancer ([place])    prostate   Chronic chest wall pain    Essential hypertension, benign 06/20/2016   GAD (generalized anxiety disorder)    GERD (gastroesophageal reflux disease)    H/O echocardiogram 2016   normal   Heart murmur    "leaking heart valve"   High cholesterol 06/20/2016   HOH (hard of hearing)    left   Hx of falling    Interstitial pulmonary disease (HCC)    Lower GI bleed    MDD (major depressive disorder)    Murmur, cardiac    Poor historian    PUD (peptic ulcer disease)    RLS (restless legs syndrome)    Shortness of breath    Wrist fracture 12/16/2011    PSH: Past Surgical History:  Procedure Laterality Date   CHOLECYSTECTOMY N/A 06/28/2020   Procedure: LAPAROSCOPIC CHOLECYSTECTOMY;  Surgeon: [name], MD;  Location: [facility];  Service: General;  Laterality: N/A;  pt in [place]  2005   Rehman: Sigmoid colon diverticulosis, moderate sized external hemorrhoids. A few focal areas of mucosal erythema felt to be nonspecific.   ESOPHAGOGASTRODUODENOSCOPY     ESOPHAGOGASTRODUODENOSCOPY N/A 07/18/2016   Rehman: normal exam except duodenal scar. h.pylori serologies negative   FLEXIBLE SIGMOIDOSCOPY N/A 06/11/2020   Procedure: FLEXIBLE SIGMOIDOSCOPY;  Surgeon: Eloise Harman, DO;  Location: [facility] ENDO SU[facility];  Service: Endoscopy;  Laterality: N/A;   HARDWARE REMOVAL Right 03/20/2021   Procedure: HARDWARE REMOVAL right hip;  Surgeon: Carole Civil, MD;  Location:  [facility] ORS;  Service: Orthopedics;  Laterality: Right;   HIP PINNING,CANNULATED Right 03/10/2020   Procedure: INTERNAL FIXATION RIGHT HIP;  Surgeon: Carole Civil, MD;  Location: [facility] ORS;  Service: Orthopedics;  Laterality: Right;   ORIF WRIST FRACTURE  12/19/2011   Procedure: OPEN REDUCTION INTERNAL FIXATION (ORIF) WRIST FRACTURE;  Surgeon: Carole Civil, MD;  Location: [facility] OR[facility];  Service: Orthopedics;  Laterality: Right;   prostate cancer     Diagnosed in the 90s   TONSILLECTOMY      SH: Social History   Tobacco Use   Smoking status: Never   Smokeless tobacco: Never  Vaping Use   Vaping Use: Never used  Substance Use Topics   Alcohol use: No  Drug use: No    ROS: All other review of systems were reviewed and are negative except what is noted above in HPI  PE: BP (!) 145/82   Pulse (!) 101  GENERAL APPEARANCE:  Well appearing, well developed, well nourished, NAD.  Pleasant and appropriate. HEENT:  Atraumatic, normocephalic.  Patient is hard of hearing NECK:  Supple. Trachea midline ABDOMEN:  Soft, mildly distended without tenderness. GU: Inguinal hernia appreciated on the right.  It is easily reducible.  Moderate tenderness with palpation.  No scrotal involvement appreciated. EXTREMITIES:  Moves all extremities well,.  Mobilizes by scooting his feet on the floor while in a wheelchair. NEUROLOGIC:  Alert and oriented to place MENTAL STATUS:  appropriate SKIN:  Warm, dry, and intact   Results: Laboratory Data: Lab Results  Component Value Date   WBC 6.1 10/18/2021   HGB 13.3 10/18/2021   HCT 41.5 10/18/2021   MCV 87.9 10/18/2021   PLT 190 10/18/2021    Lab Results  Component Value Date   CREATININE 0.95 10/18/2021    Lab Results  Component Value Date   PSA 24.0 (H) 10/19/2019   PSA 28.7 (H) 08/25/2019    Lab Results  Component Value Date   TESTOSTERONE <3 (L) 08/10/2021    Lab Results  Component Value Date   HGBA1C 5.7 (H) 03/15/2021     Urinalysis    Component Value Date/Time   COLORURINE YELLOW 06/13/2020 2215   APPEARANCEUR Clear 04/30/2021 1021   LABSPEC 1.014 06/13/2020 2215   PHURINE 5.0 06/13/2020 2215   GLUCOSEU Negative 04/30/2021 [place] 06/13/2020 2215   BILIRUBINUR Negative 04/30/2021 [place] 06/13/2020 2215   PROTEINUR 1+ (A) 04/30/2021 1021   PROTEINUR 30 (A) 06/13/2020 2215   UROBILINOGEN negative (A) 10/19/2019 0958   UROBILINOGEN 0.2 05/08/2014 2045   NITRITE Negative 04/30/2021 1021   NITRITE NEGATIVE 06/13/2020 2215   LEUKOCYTESUR Negative 04/30/2021 1021   LEUKOCYTESUR NEGATIVE 06/13/2020 2215    Lab Results  Component Value Date   LABMICR See below: 04/30/2021   WBCUA 0-5 04/30/2021   LABEPIT 0-10 04/30/2021   MUCUS Present 04/30/2021   BACTERIA Few 04/30/2021    Pertinent Imaging: Results for orders placed in visit on 10/09/00  DG Abd 1 View  Narrative FINDINGS CLINICAL DATA:  88 YEAR OLD MALE WITH RENAL CALCULI, ABDOMINAL PAIN. SINGLE VIEW ABDOMEN - 10/09/00: FINDINGS:  A SINGLE FRONTAL VIEW OF THE ABDOMEN AND PELVIS DEMONSTRATES UNREMARKABLE BOWEL GAS PATTERN.  DEFINITE CALCIFICATIONS ARE NOTED OVERLYING THE RENAL SHADOWS OR EXPECTED COURSE OF THE URETERS BUT PLEASE NOTE THAT STOOL IN THE COLONIC GAS DECREASE SENSITIVITY, ESPECIALLY OVER THE RENAL SHADOWS.   MINIMAL DEGENERATIVE CHANGE IN THE LOWER LUMBAR SPINE ARE NOTED. IMPRESSION NO DEFINITE EVIDENCE OF CALCIFICATIONS OVERLYING THE ABDOMEN/PELVIS, BUT SOMEWHAT LIMITED SENSITIVITY OVER THE RENAL SHADOWS SECONDARY TO OVERLYING COLONIC BOWEL GAS/STOOL.  No results found for this or any previous visit.  No results found for this or any previous visit.  No results found for this or any previous visit.  Results for orders placed during the hospital encounter of 03/09/20  US RENAL  Narrative CLINICAL DATA:  Acute kidney injury  EXAM: RENAL / URINARY TRACT ULTRASOUND  COMPLETE  COMPARISON:  Abdominal ultrasound 04/21/2019  FINDINGS: Right Kidney:  Renal measurements: 12.5 x 5.0 x 4.3 cm = volume: 141 mL. Echogenicity within normal limits. Multiple cysts, largest measuring 6.6 cm. No solid mass. No hydronephrosis.  Left Kidney:  Renal measurements:  11.8 x 5.6 x 5.5 cm = volume: 189 mL. Echogenicity within normal limits. Large cyst measuring 6.1 cm. No solid mass. No hydronephrosis.  Bladder:  Decompressed.  Other:  None.  IMPRESSION: No acute abnormality.  Bilateral renal cysts.   Electronically Signed By: Audie Pinto M.D. On: 03/12/2020 12:06  No results found for this or any previous visit.  No results found for this or any previous visit.  No results found for this or any previous visit.  No results found for this or any previous visit (from the past 24 hour(s)).

## 2021-12-18 ENCOUNTER — Encounter: Payer: Self-pay | Admitting: Adult Health

## 2021-12-18 ENCOUNTER — Non-Acute Institutional Stay (SKILLED_NURSING_FACILITY): Payer: Medicare Other | Admitting: Adult Health

## 2021-12-18 DIAGNOSIS — D508 Other iron deficiency anemias: Secondary | ICD-10-CM

## 2021-12-18 DIAGNOSIS — G2581 Restless legs syndrome: Secondary | ICD-10-CM | POA: Diagnosis not present

## 2021-12-18 DIAGNOSIS — E538 Deficiency of other specified B group vitamins: Secondary | ICD-10-CM | POA: Diagnosis not present

## 2021-12-18 NOTE — Progress Notes (Signed)
Location:  [place] Room Number: 156-D Place of Service:  SNF (31)   CODE STATUS: DNR  Allergies  Allergen Reactions   Hctz [Hydrochlorothiazide]     hypokalemia   Xanax [Alprazolam] Other (See Comments)    Causes hallucinations     Chief Complaint  Patient presents with   Medical Management of Chronic Issues                               RLS:  Iron deficiency anemia due to dietary intake: Interstitial lung disease:  Vitamin B 12 deficiency    HPI:  He is an 88 year old long term resident of this facility being seen for the management of his chronic illnesses:  RLS:  Iron deficiency anemia due to dietary intake: Interstitial lung disease:  Vitamin B 12 deficiency. He is awaiting surgical consult regarding his inguinal hernia. There are no reports of uncontrolled pain. He does spend most of his time in his room per his choice. His weight is stable.   Past Medical History:  Diagnosis Date   Anxiety    Cancer [facility])    prostate   Chronic chest wall pain    Essential hypertension, benign 06/20/2016   GAD (generalized anxiety disorder)    GERD (gastroesophageal reflux disease)    H/O echocardiogram 2016   normal   Heart murmur    "leaking heart valve"   High cholesterol 06/20/2016   HOH (hard of hearing)    left   Hx of falling    Interstitial pulmonary disease (HCC)    Lower GI bleed    MDD (major depressive disorder)    Murmur, cardiac    Poor historian    PUD (peptic ulcer disease)    RLS (restless legs syndrome)    Shortness of breath    Wrist fracture 12/16/2011    Past Surgical History:  Procedure Laterality Date   CHOLECYSTECTOMY N/A 06/28/2020   Procedure: LAPAROSCOPIC CHOLECYSTECTOMY;  Surgeon: [name], MD;  Location: [facility];  Service: General;  Laterality: N/A;  pt in [place]  2005   Rehman: Sigmoid colon diverticulosis, moderate sized external hemorrhoids. A few focal areas of mucosal erythema felt  to be nonspecific.   ESOPHAGOGASTRODUODENOSCOPY     ESOPHAGOGASTRODUODENOSCOPY N/A 07/18/2016   Rehman: normal exam except duodenal scar. h.pylori serologies negative   FLEXIBLE SIGMOIDOSCOPY N/A 06/11/2020   Procedure: FLEXIBLE SIGMOIDOSCOPY;  Surgeon: Eloise Harman, DO;  Location: [facility] ENDO SU[facility];  Service: Endoscopy;  Laterality: N/A;   HARDWARE REMOVAL Right 03/20/2021   Procedure: HARDWARE REMOVAL right hip;  Surgeon: Carole Civil, MD;  Location: [facility] ORS;  Service: Orthopedics;  Laterality: Right;   HIP PINNING,CANNULATED Right 03/10/2020   Procedure: INTERNAL FIXATION RIGHT HIP;  Surgeon: Carole Civil, MD;  Location: [facility] OR[facility];  Service: Orthopedics;  Laterality: Right;   ORIF WRIST FRACTURE  12/19/2011   Procedure: OPEN REDUCTION INTERNAL FIXATION (ORIF) WRIST FRACTURE;  Surgeon: Carole Civil, MD;  Location: [facility] ORS;  Service: Orthopedics;  Laterality: Right;   prostate cancer     Diagnosed in the 90s   TONSILLECTOMY      Social History   Socioeconomic History   Marital status: Widowed    Spouse name: Not on file   Number of children: Not on file   Years of education: Not on file   Highest education level: Not on file  Occupational History   Occupation: retired    Fish farm manager: RETIRED  Tobacco Use   Smoking status: Never   Smokeless tobacco: Never  Vaping Use   Vaping Use: Never used  Substance and Sexual Activity   Alcohol use: No   Drug use: No   Sexual activity: Not Currently  Other Topics Concern   Not on file  Social History Narrative   Is long term [facility] of [facility]    Social Determinants of Health   Financial Resource Strain: Not on file  Food Insecurity: Not on file  Transportation Needs: Not on file  Physical Activity: Not on file  Stress: Not on file  Social Connections: Not on file  Intimate Partner Violence: Not on file   Family History  Problem Relation Age of Onset   Cancer Other    Heart attack Mother    Cancer Brother    Heart  attack Brother    Heart disease Sister        by pass      VITAL SIGNS BP (!) 141/77   Pulse (!) 55   Temp (!) 97.4 F (36.3 C)   Resp 20   Ht '5\' 9"'$  (1.753 m)   Wt 178 lb 3.2 oz (80.8 kg)   SpO2 98%   BMI 26.32 kg/m   Outpatient Encounter Medications as of 12/18/2021  Medication Sig   acetaminophen (TYLENOL) 500 MG tablet Take 500 mg by mouth every 6 (six) hours. Max '3000mg'$  in 24 hour period   alfuzosin (UROXATRAL) 10 MG 24 hr tablet Take 10 mg by mouth daily. 6 pm   Ascorbic Acid (VITAMIN C) 1000 MG tablet Take 1,000 mg by mouth daily.   Balsam [place]-Castor Oil (VENELEX) OINT Apply 1 application topically every 8 (eight) hours as needed (Apply to bilateral buttocks & sacral area qshift for blanchable erythema.).   calcium carbonate (TUMS - DOSED IN MG ELEMENTAL CALCIUM) 500 MG chewable tablet Chew 1 tablet by mouth 2 (two) times daily as needed for indigestion or heartburn.    diltiazem (CARDIZEM CD) 360 MG 24 hr capsule Take 1 capsule (360 mg total) by mouth daily.   Flax Oil-Fish Oil-Borage Oil (FISH OIL-FLAX OIL-BORAGE OIL) CAPS Take 1 capsule by mouth daily.   hydrALAZINE (APRESOLINE) 10 MG tablet Take 10 mg by mouth 3 (three) times daily. Hypertensive chronic kidney disease with stage 1 through stage 4 chronic kidney disease, or unspecified chronic kidney disease   isosorbide mononitrate (IMDUR) 60 MG 24 hr tablet Take 60 mg by mouth daily.   lisinopril (ZESTRIL) 20 MG tablet Take 20 mg by mouth daily. Hypertensive chronic kidney disease with stage 1 through stage 4 chronic kidney disease, or unspecified chronic kidney disease   loperamide (IMODIUM A-D) 2 MG tablet Take 2 mg by mouth as needed (after each stool. (do not exceed more than 4 tabs in 24 hours)).   loratadine (CLARITIN) 10 MG tablet Take 10 mg by mouth daily.   Melatonin 10 MG TABS Take 10 mg by mouth at bedtime.   meloxicam (MOBIC) 7.5 MG tablet Take 7.5 mg by mouth daily. give for left hip pain   memantine  (NAMENDA) 10 MG tablet Take 10 mg by mouth 2 (two) times daily.   methocarbamol (ROBAXIN) 500 MG tablet Take 500 mg by mouth daily.   metoprolol tartrate (LOPRESSOR) 25 MG tablet Take 1 tablet (25 mg total) by mouth 2 (two) times daily.   omeprazole (PRILOSEC) 20 MG capsule Take 1 capsule (20 mg  total) by mouth daily before breakfast.   ondansetron (ZOFRAN) 8 MG tablet Take 8 mg by mouth as needed for nausea.   OXYGEN Inhale 2 L into the lungs as needed (To keep O2 ststa > 90 %).   polyethylene glycol (MIRALAX / GLYCOLAX) 17 g packet Take 17 g by mouth daily. On Tuesday, Thursday, and Saturday. At Bedtime   potassium chloride SA (KLOR-CON) 20 MEQ tablet Take 20 mEq by mouth 2 (two) times daily.   QUEtiapine (SEROQUEL) 25 MG tablet Take 25 mg by mouth 2 (two) times daily.   rOPINIRole (REQUIP) 0.25 MG tablet Take 0.25 mg by mouth at bedtime.   senna-docusate (SENOKOT-S) 8.6-50 MG tablet Take 1 tablet by mouth at bedtime as needed for mild constipation.   simethicone (MYLICON) 151 MG chewable tablet Chew 125 mg by mouth 3 (three) times daily as needed ("increased gas").   UNABLE TO FIND Diet:Regular   venlafaxine XR (EFFEXOR-XR) 150 MG 24 hr capsule Take 150 mg by mouth daily with breakfast.   Vibegron (GEMTESA) 75 MG TABS Take 1 tablet by mouth daily as needed.   vitamin B-12 (CYANOCOBALAMIN) 1000 MCG tablet Take 1,000 mcg by mouth daily. for vitamin B 12 level 205   [DISCONTINUED] predniSONE (DELTASONE) 20 MG tablet    [DISCONTINUED] QUEtiapine (SEROQUEL) 25 MG tablet Take 12.5 mg by mouth 2 (two) times daily.   No facility-administered encounter medications on file as of 12/18/2021.     SIGNIFICANT DIAGNOSTIC EXAMS   PREVIOUS   09-10-21: chest x-ray: patch asymmetric inflammatory changes. These could represent areas of atypical infection; focal atelectasis infarction or evolving consolidation among other possibilities.  11-15-21: scrotal ultrasounds No evidence of intratesticular   mass or  torsion bilateral  No evidence of varicoceles bilateral  Small sized right hydrocele Bilateral epididymal head is enlarges may represent epididymitis   NO NEW LABS.    LABS REVIEWED PREVIOUS     12-21-20: wbc 4.6; hgb 13.4 hct 40.9 mcv 86.1 plt 223; glucose 104; bun 22; creat 0.89; k+ 3.0; na++ 143; ca 9.3 GFR>60; liver normal albumin 3.3; chol 128; ldl 71; trig 85 hdl 40   12-25-20: k+ 3.2 01-01-21: k+ 3.2 01-08-21: k+ 4.5 01-19-21: glucose 108; bun 20; creat 0.96; k+ 3.6; na++ 139; ca 9.0 GFR>60 02-05-21: glucose 109; bun 25; creat 1.05; k+ 4.5; na++ 141; ca 9.5; GFR>60.  03-13-21: wbc 7.3; hgb 14.0; hct 42.5; mcv 85.5 plt 212; glucose 126; bun 24; creat 1.03 ;k+ 3.9; na++ 139; ca 9.6; GFR>60 03-15-21: hgb a1c 5.7 03-20-21: wbc 20.3; hgb 14.6; hct 44.4; mcv 86.7 plt 229; glcusoe 201; bun 26; creat 1.44; k+ 4.2; na++ 138; ca 9.2; GFR 46  03-22-21: aldosterone 7.6; cortisol AM 19.5;  03-26-21: wbc 6.3; hgb 12.4; hct 37.9; mcv 87.5 plt 230; glucose 117; bun 28; creat 1.13; k+ 4.4; na++ 138; ca 9.2; GFR>60  04-23-21: PSA 0.6; testosterone: <3 08-10-21: PSA 0.17 testosterone <3 08-16-21: wbc 6.0; hgb 12.0; hct 37.1; mcv 89.0 plt 181; glucose 98; bun 40; creat 1.46; k+ 5.7; na++ 138; ca 9.3 GFR>46 protein 5.8; albumin 3.4; tsh 0.620 08-24-21: glucose 104; bun 46; creat 2.07; k+ 4.7; na++ 141; ca 8.5; GFR 30  08-30-21: vitamin B12  205  09-10-21: wbc 8.5; hgb 12.8; hct 38.5; mcv 87.3; plt 167; glucose 131; bun 25; creat 1.20; k+ 3.9; na++ 140; ca 9.6; GFR 58; iron 38; tibc 261; folate 14.2; CRP 6.7; d-dimer 0.43 09-17-21: CRP <0.5 10-18-21: wbc 6.1; hgb 13.3; hct 41.5;  mcv 89.7 plt 190; glucose 91; bun 25; creat 0.95; k+ 3.4; na++ 142; ca 9.3; gfr >60; protein 6.0; albumin 3.5  NO NEW LABS.    Review of Systems  Constitutional:  Negative for malaise/fatigue.  Respiratory:  Negative for cough and shortness of breath.   Cardiovascular:  Negative for chest pain, palpitations and leg swelling.   Gastrointestinal:  Negative for abdominal pain, constipation and heartburn.  Musculoskeletal:  Negative for back pain, joint pain and myalgias.  Skin: Negative.   Neurological:  Negative for dizziness.  Psychiatric/Behavioral:  The patient is not nervous/anxious.       Physical Exam Constitutional:      General: He is not in acute distress.    Appearance: He is well-developed. He is not diaphoretic.  Neck:     Thyroid: No thyromegaly.  Cardiovascular:     Rate and Rhythm: Normal rate and regular rhythm.     Pulses: Normal pulses.     Heart sounds: Normal heart sounds.  Pulmonary:     Effort: Pulmonary effort is normal. No respiratory distress.     Breath sounds: Normal breath sounds.  Abdominal:     General: Bowel sounds are normal. There is no distension.     Palpations: Abdomen is soft.     Tenderness: There is no abdominal tenderness.  Musculoskeletal:        General: Normal range of motion.     Cervical back: Neck supple.     Right lower leg: No edema.     Left lower leg: No edema.  Lymphadenopathy:     Cervical: No cervical adenopathy.  Skin:    General: Skin is warm and dry.  Neurological:     Mental Status: He is alert. Mental status is at baseline.  Psychiatric:        Mood and Affect: Mood normal.     ASSESSMENT/ PLAN:  TODAY  RLS: will continue requip 0.25 mg nightly   2. Iron deficiency anemia due to dietary intake: hgb 13.3; is off iron  3. Interstitial lung disease: claritin 10 mg daily   4. Vitamin B 12 deficiency: level 205 will continue 1,000 mcg daily   PREVIOUS    5. Left hip pain: has had hardware removed in past: will continue mobic 7.5 mg daily robaxin 500 mg daily; pain is currently managed.   6. Dementia without behavioral disturbance unspecified dementia type: is without change weight is 178 pounds; will continue namenda 10 mg twice daily   7. Aortic atherosclerosis: (ct 06-09-20) will monitor is on asa 81 mg daily   8. Protein  calorie malnutrition: albumin 3.5 will monitor   9. Benign hypertension with chronic kidney disease stage III stable b/p 155/77 will continue lopressor 25 mg twice daily cardizem cd 360 mg daily lisinopril 20 mg daily   10. Gastroesophageal reflux disease without esophagitis: will continue prilosec 20 mg daily   11. Prostate caner/over active bladder; will continue urostral 10 mg daily; gemtesa 75 mg daily as needed: no oxybutynin due to history of fracture; off myebretriq due to hypertension  12. Hypokalemia k+ 3.4 will continue k+ 20 meq twice daily   13. Dyslipidemia: is stable is off zocor will continue fish oil 1 gm daily   14. Chronic constipation: will continue miralax three times weekly; and and senna s as needed  15. CKD stage 3b; is stable bun 25; creat 0.95; gfr>60  16. Major depression recurrent chronic: will continue effexor xr 150 mg daily melatonin 10 mg  nightly and seroquel 25 mg twice daily      Ok Edwards NP [facility]   call [phone]

## 2021-12-19 DIAGNOSIS — F331 Major depressive disorder, recurrent, moderate: Secondary | ICD-10-CM | POA: Diagnosis not present

## 2021-12-26 DIAGNOSIS — F331 Major depressive disorder, recurrent, moderate: Secondary | ICD-10-CM | POA: Diagnosis not present

## 2022-01-01 DIAGNOSIS — G3184 Mild cognitive impairment, so stated: Secondary | ICD-10-CM | POA: Diagnosis not present

## 2022-01-01 DIAGNOSIS — F411 Generalized anxiety disorder: Secondary | ICD-10-CM | POA: Diagnosis not present

## 2022-01-01 DIAGNOSIS — F321 Major depressive disorder, single episode, moderate: Secondary | ICD-10-CM | POA: Diagnosis not present

## 2022-01-02 DIAGNOSIS — F331 Major depressive disorder, recurrent, moderate: Secondary | ICD-10-CM | POA: Diagnosis not present

## 2022-01-09 DIAGNOSIS — F331 Major depressive disorder, recurrent, moderate: Secondary | ICD-10-CM | POA: Diagnosis not present

## 2022-01-15 ENCOUNTER — Encounter: Payer: Self-pay | Admitting: Internal Medicine

## 2022-01-15 ENCOUNTER — Non-Acute Institutional Stay (SKILLED_NURSING_FACILITY): Payer: Medicare Other | Admitting: Internal Medicine

## 2022-01-15 DIAGNOSIS — K4090 Unilateral inguinal hernia, without obstruction or gangrene, not specified as recurrent: Secondary | ICD-10-CM

## 2022-01-15 DIAGNOSIS — F419 Anxiety disorder, unspecified: Secondary | ICD-10-CM | POA: Diagnosis not present

## 2022-01-15 DIAGNOSIS — F0390 Unspecified dementia without behavioral disturbance: Secondary | ICD-10-CM | POA: Diagnosis not present

## 2022-01-15 DIAGNOSIS — N183 Chronic kidney disease, stage 3 unspecified: Secondary | ICD-10-CM

## 2022-01-15 DIAGNOSIS — I129 Hypertensive chronic kidney disease with stage 1 through stage 4 chronic kidney disease, or unspecified chronic kidney disease: Secondary | ICD-10-CM

## 2022-01-15 DIAGNOSIS — K4091 Unilateral inguinal hernia, without obstruction or gangrene, recurrent: Secondary | ICD-10-CM

## 2022-01-15 DIAGNOSIS — D508 Other iron deficiency anemias: Secondary | ICD-10-CM

## 2022-01-15 DIAGNOSIS — E441 Mild protein-calorie malnutrition: Secondary | ICD-10-CM | POA: Diagnosis not present

## 2022-01-15 DIAGNOSIS — F331 Major depressive disorder, recurrent, moderate: Secondary | ICD-10-CM | POA: Diagnosis not present

## 2022-01-15 HISTORY — DX: Unilateral inguinal hernia, without obstruction or gangrene, not specified as recurrent: K40.90

## 2022-01-15 NOTE — Progress Notes (Unsigned)
NURSING HOME LOCATION:  [facility] ROOM NUMBER:  [id number] D  CODE STATUS:  DNR  PCP:  Ok Edwards NP  This is a nursing facility follow up visit of chronic medical diagnoses & to document compliance with Regulation 483.30 (c) in [employer] Manual Phase 2 which mandates caregiver visit ( visits can alternate among physician, PA or NP as per statutes) within 10 days of 30 days / 60 days/ 90 days post admission to SNF date    Interim medical record and care since last SNF visit was updated with review of diagnostic studies and change in clinical status since last visit were documented.  HPI: He is a permanent resident of the facility with medical diagnoses of history of prostate cancer, essential hypertension, GAD, GERD, dyslipidemia, interstitial lung disease, major depressive disorder, and history of PUD. Surgeries and procedures include EGD, colonoscopy, cholecystectomy, and history of prostate biopsy in the 90s.  Labs are current; he has CKD stage II with creatinine of 0.95 and GFR greater than 60.  Albumin is low normal at 3.5 and total protein reduced at 6.CBC & dif are normal.  Review of systems: He was able to give the correct date but he seemed to exhibit some difficulty with word retrieval and with other memory issues.  Initially he stated he was doing "pretty good.".  He then began to complain of pain related to an inguinal hernia.  He states that "it is open and hurting.".  He also describes frequency with nocturia up to 5 times nightly.  He also validated nonspecific, throbbing right upper chest pain without any predisposing factors or exacerbating factors.  He also mentions shortness of breath which is a chronic issue.  He is aware that he has an appointment with "a woman" but could not tell me why or what specialty was.  The chart indicates that she is to see Dr. Curlene Labrum, General Surgery, on 8/22.  Apparently she will see him to consider  surgical correction of the inguinal hernia.   He validates chronic anxiety & depression, described in a flat monotone. I was told that he has psychiatric follow-up today; he is unaware of this appointment.  Physical exam:  Pertinent or positive findings: Affect is flat and as noted he has difficulty with memory and also word retrieval intermittently.  Initially was asleep and did not exhibit hypopnea, snoring, or apnea.  Upon awakening responses are slow.  Eyebrow density is decreased.  He is edentulous except for 5 lower mandibular teeth.  Heart sounds are distant. & breath sounds decreased.  Abdomen is protuberant.  I could not appreciate an inguinal hernia.  He has 1/2+ edema at the sock line.  Pedal pulses are decreased.  Interosseous wasting of the hands is noted.  He has irregular splotchy hyperpigmentation over the forearms and dorsum of the hands.  General appearance: no acute distress, increased work of breathing is present.   Lymphatic: No lymphadenopathy about the head, neck, axilla. Eyes: No conjunctival inflammation or lid edema is present. There is no scleral icterus. Ears:  External ear exam shows no significant lesions or deformities.   Nose:  External nasal examination shows no deformity or inflammation. Nasal mucosa are pink and moist without lesions, exudates Neck:  No thyromegaly, masses, tenderness noted.    Heart:  No gallop, murmur, click, rub .  Lungs: without wheezes, rhonchi, rales, rubs. Abdomen: Bowel sounds are normal. Abdomen is soft and nontender with no organomegaly, masses.  Extremities:  No cyanosis, clubbing Neurologic exam :Balance, Rhomberg, finger to nose testing could not be completed due to clinical state Skin: Warm & dry w/o tenting. No significant lesions or rash.  See summary under each active problem in the Problem List with associated updated therapeutic plan

## 2022-01-15 NOTE — Assessment & Plan Note (Signed)
General surgery consult 01/29/2022 with Dr. Curlene Labrum.

## 2022-01-15 NOTE — Assessment & Plan Note (Signed)
As of 10/18/2021 anemia has resolved completely.  No bleeding dyscrasias reported by staff.  Continue to monitor.

## 2022-01-15 NOTE — Assessment & Plan Note (Signed)
He does exhibit interosseous wasting.  Albumin is low normal at 3.5 and total protein is reduced to 6.0.  Nutritionist to continue to follow at [facility].

## 2022-01-15 NOTE — Patient Instructions (Signed)
See assessment and plan under each diagnosis in the problem list and acutely for this visit 

## 2022-01-15 NOTE — Assessment & Plan Note (Signed)
CKD is now stage II with a creatinine of 0.95 and GFR greater than 60.  No change in medications indicated.

## 2022-01-17 ENCOUNTER — Non-Acute Institutional Stay (SKILLED_NURSING_FACILITY): Payer: Medicare Other | Admitting: Adult Health

## 2022-01-17 DIAGNOSIS — I129 Hypertensive chronic kidney disease with stage 1 through stage 4 chronic kidney disease, or unspecified chronic kidney disease: Secondary | ICD-10-CM

## 2022-01-17 DIAGNOSIS — N183 Chronic kidney disease, stage 3 unspecified: Secondary | ICD-10-CM

## 2022-01-17 NOTE — Assessment & Plan Note (Signed)
He has difficulty with word retrieval & seems to confabulate , describing a painful "open" hernia not documented on exam.

## 2022-01-18 ENCOUNTER — Encounter: Payer: Self-pay | Admitting: Adult Health

## 2022-01-18 NOTE — Progress Notes (Signed)
Location:  [place] Room Number: 156 Place of Service:   snf    CODE STATUS: dnr   Allergies  Allergen Reactions   Hctz [Hydrochlorothiazide]     hypokalemia   Xanax [Alprazolam] Other (See Comments)    Causes hallucinations     Chief Complaint  Patient presents with   Acute Visit    Hypertension and heart rate     HPI:  He has had some bradycardic episodes; his blood pressure readings have been variable. From 139-195/ -61/84. There are no reports of chest pain; no shortness of breath. He denies any swelling of his feet.   Past Medical History:  Diagnosis Date   Anxiety    Cancer [facility])    prostate   Chronic chest wall pain    Essential hypertension, benign 06/20/2016   GAD (generalized anxiety disorder)    GERD (gastroesophageal reflux disease)    H/O echocardiogram 2016   normal   Heart murmur    "leaking heart valve"   High cholesterol 06/20/2016   HOH (hard of hearing)    left   Hx of falling    Interstitial pulmonary disease (HCC)    Lower GI bleed    MDD (major depressive disorder)    Murmur, cardiac    Poor historian    PUD (peptic ulcer disease)    RLS (restless legs syndrome)    Shortness of breath    Wrist fracture 12/16/2011    Past Surgical History:  Procedure Laterality Date   CHOLECYSTECTOMY N/A 06/28/2020   Procedure: LAPAROSCOPIC CHOLECYSTECTOMY;  Surgeon: [name], MD;  Location: [facility] ORS;  Service: General;  Laterality: N/A;  pt in [place]  2005   Rehman: Sigmoid colon diverticulosis, moderate sized external hemorrhoids. A few focal areas of mucosal erythema felt to be nonspecific.   ESOPHAGOGASTRODUODENOSCOPY     ESOPHAGOGASTRODUODENOSCOPY N/A 07/18/2016   Rehman: normal exam except duodenal scar. h.pylori serologies negative   FLEXIBLE SIGMOIDOSCOPY N/A 06/11/2020   Procedure: FLEXIBLE SIGMOIDOSCOPY;  Surgeon: Eloise Harman, DO;  Location: [facility] SUITE;  Service: Endoscopy;   Laterality: N/A;   HARDWARE REMOVAL Right 03/20/2021   Procedure: HARDWARE REMOVAL right hip;  Surgeon: Carole Civil, MD;  Location: [facility] OR[facility];  Service: Orthopedics;  Laterality: Right;   HIP PINNING,CANNULATED Right 03/10/2020   Procedure: INTERNAL FIXATION RIGHT HIP;  Surgeon: Carole Civil, MD;  Location: [facility];  Service: Orthopedics;  Laterality: Right;   ORIF WRIST FRACTURE  12/19/2011   Procedure: OPEN REDUCTION INTERNAL FIXATION (ORIF) WRIST FRACTURE;  Surgeon: Carole Civil, MD;  Location: [facility];  Service: Orthopedics;  Laterality: Right;   prostate cancer     Diagnosed in the 90s   TONSILLECTOMY      Social History   Socioeconomic History   Marital status: Widowed    Spouse name: Not on file   Number of children: Not on file   Years of education: Not on file   Highest education level: Not on file  Occupational History   Occupation: retired    Fish farm manager: RETIRED  Tobacco Use   Smoking status: Never   Smokeless tobacco: Never  Vaping Use   Vaping Use: Never used  Substance and Sexual Activity   Alcohol use: No   Drug use: No   Sexual activity: Not Currently  Other Topics Concern   Not on file  Social History Narrative   Is long term resident of [facility]  Social Determinants of Health   Financial Resource Strain: Not on file  Food Insecurity: Not on file  Transportation Needs: Not on file  Physical Activity: Not on file  Stress: Not on file  Social Connections: Not on file  Intimate Partner Violence: Not on file   Family History  Problem Relation Age of Onset   Cancer Other    Heart attack Mother    Cancer Brother    Heart attack Brother    Heart disease Sister        by pass      VITAL SIGNS BP 139/61   Pulse (!) 52   Temp (!) 97 F (36.1 C)   Resp 16   Ht 5' 9"  (1.753 m)   Wt 177 lb 6.4 oz (80.5 kg)   SpO2 97%   BMI 26.20 kg/m   Outpatient Encounter Medications as of 01/17/2022  Medication Sig   acetaminophen (TYLENOL) 500 MG  tablet Take 500 mg by mouth every 6 (six) hours. Max 3027m in 24 hour period   alfuzosin (UROXATRAL) 10 MG 24 hr tablet Take 10 mg by mouth daily. 6 pm   Ascorbic Acid (VITAMIN C) 1000 MG tablet Take 1,000 mg by mouth daily.   Balsam [place]-Castor Oil (VENELEX) OINT Apply 1 application topically every 8 (eight) hours as needed (Apply to bilateral buttocks & sacral area qshift for blanchable erythema.).   calcium carbonate (TUMS - DOSED IN MG ELEMENTAL CALCIUM) 500 MG chewable tablet Chew 1 tablet by mouth 2 (two) times daily as needed for indigestion or heartburn.    diltiazem (CARDIZEM CD) 360 MG 24 hr capsule Take 1 capsule (360 mg total) by mouth daily.   Flax Oil-Fish Oil-Borage Oil (FISH OIL-FLAX OIL-BORAGE OIL) CAPS Take 1 capsule by mouth daily.   hydrALAZINE (APRESOLINE) 10 MG tablet Take 10 mg by mouth 3 (three) times daily. Hypertensive chronic kidney disease with stage 1 through stage 4 chronic kidney disease, or unspecified chronic kidney disease   isosorbide mononitrate (IMDUR) 60 MG 24 hr tablet Take 60 mg by mouth daily.   lisinopril (ZESTRIL) 20 MG tablet Take 20 mg by mouth daily. Hypertensive chronic kidney disease with stage 1 through stage 4 chronic kidney disease, or unspecified chronic kidney disease   loperamide (IMODIUM A-D) 2 MG tablet Take 2 mg by mouth as needed (after each stool. (do not exceed more than 4 tabs in 24 hours)).   loratadine (CLARITIN) 10 MG tablet Take 10 mg by mouth daily.   Melatonin 10 MG TABS Take 10 mg by mouth at bedtime.   meloxicam (MOBIC) 7.5 MG tablet Take 7.5 mg by mouth daily. give for left hip pain   memantine (NAMENDA) 10 MG tablet Take 10 mg by mouth 2 (two) times daily.   methocarbamol (ROBAXIN) 500 MG tablet Take 500 mg by mouth daily.   metoprolol tartrate (LOPRESSOR) 25 MG tablet Take 1 tablet (25 mg total) by mouth 2 (two) times daily.   omeprazole (PRILOSEC) 20 MG capsule Take 1 capsule (20 mg total) by mouth daily before breakfast.    ondansetron (ZOFRAN) 8 MG tablet Take 8 mg by mouth as needed for nausea.   OXYGEN Inhale 2 L into the lungs as needed (To keep O2 ststa > 90 %).   polyethylene glycol (MIRALAX / GLYCOLAX) 17 g packet Take 17 g by mouth daily. On Tuesday, Thursday, and Saturday. At Bedtime   potassium chloride SA (KLOR-CON) 20 MEQ tablet Take 20 mEq by mouth 2 (two)  times daily.   QUEtiapine (SEROQUEL) 25 MG tablet Take 25 mg by mouth 2 (two) times daily.   rOPINIRole (REQUIP) 0.25 MG tablet Take 0.25 mg by mouth at bedtime.   senna-docusate (SENOKOT-S) 8.6-50 MG tablet Take 1 tablet by mouth at bedtime as needed for mild constipation.   simethicone (MYLICON) 938 MG chewable tablet Chew 125 mg by mouth 3 (three) times daily as needed ("increased gas").   UNABLE TO FIND Diet:Regular   venlafaxine XR (EFFEXOR-XR) 150 MG 24 hr capsule Take 150 mg by mouth daily with breakfast.   Vibegron (GEMTESA) 75 MG TABS Take 1 tablet by mouth daily as needed.   vitamin B-12 (CYANOCOBALAMIN) 1000 MCG tablet Take 1,000 mcg by mouth daily. for vitamin B 12 level 205   No facility-administered encounter medications on file as of 01/17/2022.     SIGNIFICANT DIAGNOSTIC EXAMS  PREVIOUS   09-10-21: chest x-ray: patch asymmetric inflammatory changes. These could represent areas of atypical infection; focal atelectasis infarction or evolving consolidation among other possibilities.  11-15-21: scrotal ultrasounds No evidence of intratesticular   mass or torsion bilateral  No evidence of varicoceles bilateral  Small sized right hydrocele Bilateral epididymal head is enlarges may represent epididymitis   NO NEW LABS.    LABS REVIEWED PREVIOUS     01-08-21: k+ 4.5 01-19-21: glucose 108; bun 20; creat 0.96; k+ 3.6; na++ 139; ca 9.0 GFR>60 02-05-21: glucose 109; bun 25; creat 1.05; k+ 4.5; na++ 141; ca 9.5; GFR>60.  03-13-21: wbc 7.3; hgb 14.0; hct 42.5; mcv 85.5 plt 212; glucose 126; bun 24; creat 1.03 ;k+ 3.9; na++ 139; ca 9.6;  GFR>60 03-15-21: hgb a1c 5.7 03-20-21: wbc 20.3; hgb 14.6; hct 44.4; mcv 86.7 plt 229; glcusoe 201; bun 26; creat 1.44; k+ 4.2; na++ 138; ca 9.2; GFR 46  03-22-21: aldosterone 7.6; cortisol AM 19.5;  03-26-21: wbc 6.3; hgb 12.4; hct 37.9; mcv 87.5 plt 230; glucose 117; bun 28; creat 1.13; k+ 4.4; na++ 138; ca 9.2; GFR>60  04-23-21: PSA 0.6; testosterone: <3 08-10-21: PSA 0.17 testosterone <3 08-16-21: wbc 6.0; hgb 12.0; hct 37.1; mcv 89.0 plt 181; glucose 98; bun 40; creat 1.46; k+ 5.7; na++ 138; ca 9.3 GFR>46 protein 5.8; albumin 3.4; tsh 0.620 08-24-21: glucose 104; bun 46; creat 2.07; k+ 4.7; na++ 141; ca 8.5; GFR 30  08-30-21: vitamin B12  205  09-10-21: wbc 8.5; hgb 12.8; hct 38.5; mcv 87.3; plt 167; glucose 131; bun 25; creat 1.20; k+ 3.9; na++ 140; ca 9.6; GFR 58; iron 38; tibc 261; folate 14.2; CRP 6.7; d-dimer 0.43 09-17-21: CRP <0.5 10-18-21: wbc 6.1; hgb 13.3; hct 41.5; mcv 89.7 plt 190; glucose 91; bun 25; creat 0.95; k+ 3.4; na++ 142; ca 9.3; gfr >60; protein 6.0; albumin 3.5  NO NEW LABS.    Review of Systems  Constitutional:  Negative for malaise/fatigue.  Respiratory:  Negative for cough and shortness of breath.   Cardiovascular:  Negative for chest pain, palpitations and leg swelling.  Gastrointestinal:  Negative for abdominal pain, constipation and heartburn.  Musculoskeletal:  Negative for back pain, joint pain and myalgias.  Skin: Negative.   Neurological:  Negative for dizziness.  Psychiatric/Behavioral:  The patient is not nervous/anxious.    Physical Exam Constitutional:      General: He is not in acute distress.    Appearance: He is well-developed. He is not diaphoretic.  Neck:     Thyroid: No thyromegaly.  Cardiovascular:     Rate and Rhythm: Normal rate and regular  rhythm.     Pulses: Normal pulses.     Heart sounds: Normal heart sounds.  Pulmonary:     Effort: Pulmonary effort is normal. No respiratory distress.     Breath sounds: Normal breath sounds.   Abdominal:     General: Bowel sounds are normal. There is no distension.     Palpations: Abdomen is soft.     Tenderness: There is no abdominal tenderness.  Musculoskeletal:        General: Normal range of motion.     Cervical back: Neck supple.     Right lower leg: No edema.     Left lower leg: No edema.  Lymphadenopathy:     Cervical: No cervical adenopathy.  Skin:    General: Skin is warm and dry.  Neurological:     Mental Status: He is alert. Mental status is at baseline.  Psychiatric:        Mood and Affect: Mood normal.      ASSESSMENT/ PLAN:  TODAY  Benign hypertension with chronic kidney disease stage III Will increase lisinopril to 30 mg daily  Will lower cardizem cd to 300 mg daily due to her bradycardia Will repeat BMP in 2 weeks.     Ok Edwards NP [facility]   call [phone]

## 2022-01-21 IMAGING — CT CT ABD-PELV W/ CM
2 of 5 series · 16 of 46 positions shown, 18 images · IV contrast (Omnipaque or Isovue)
Comparison: 06/05/2020

CLINICAL DATA: Abdominal distension

EXAM:
CT ABDOMEN AND PELVIS WITH CONTRAST
TECHNIQUE: Multidetector CT imaging of the abdomen and pelvis was performed
using the standard protocol following bolus administration of
intravenous contrast.
CONTRAST:  75mL OMNIPAQUE IOHEXOL 300 MG/ML  SOLN

[Series 2: axial st · axial · 0.95mm/px · z∈[-516,-96]mm · 13 of 96 slices shown, 15 images]
[im 6/96  soft-tissue]
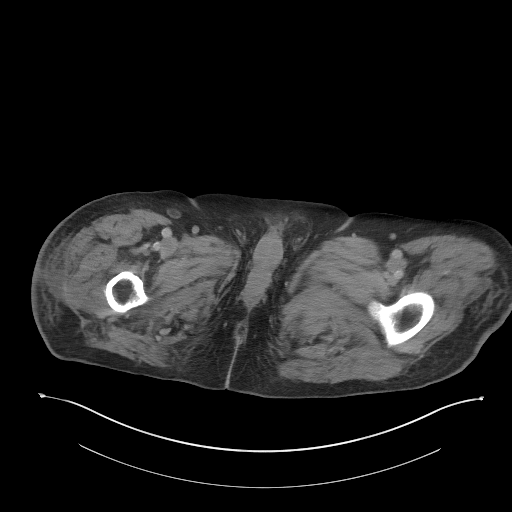
[im 6/96  bone]
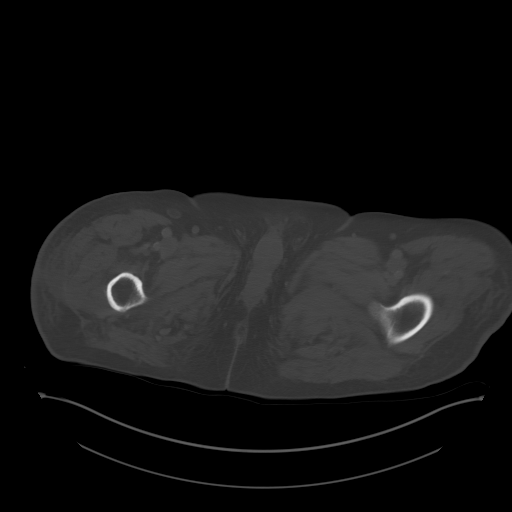
[im 12/96  soft-tissue]
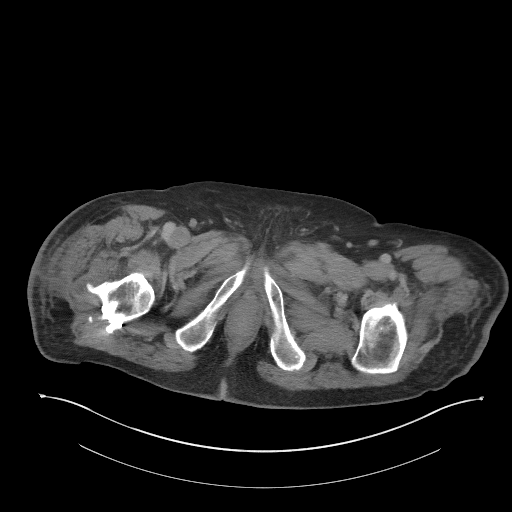
[im 18/96  soft-tissue]
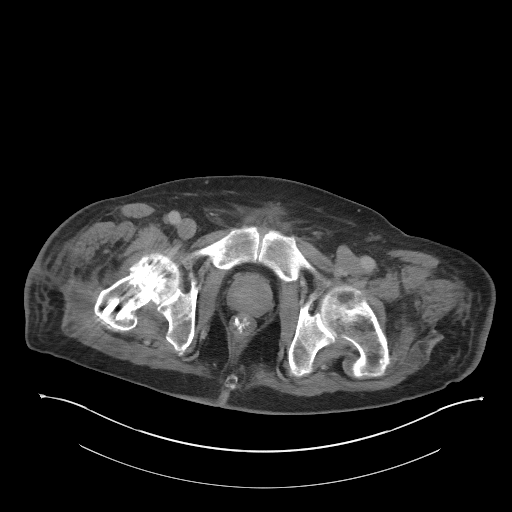
[im 30/96  soft-tissue]
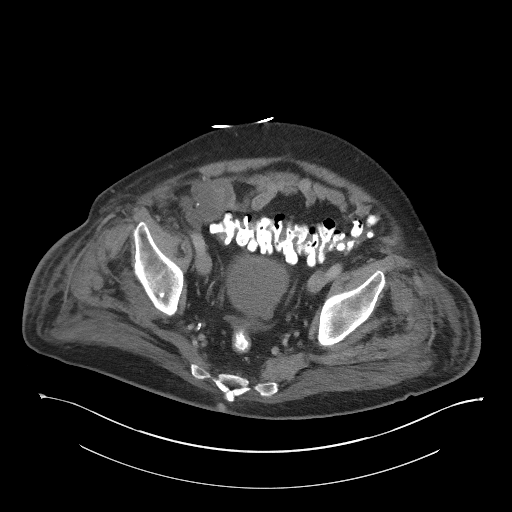
[im 36/96  soft-tissue]
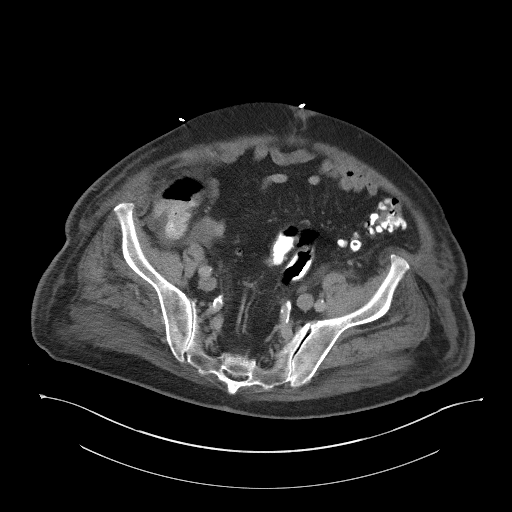
[im 42/96  soft-tissue]
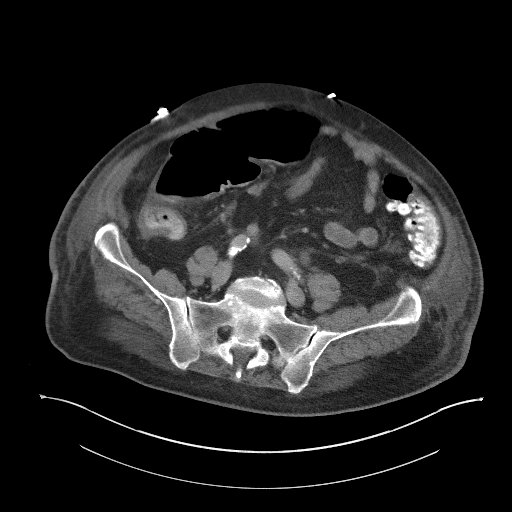
[im 48/96  soft-tissue]
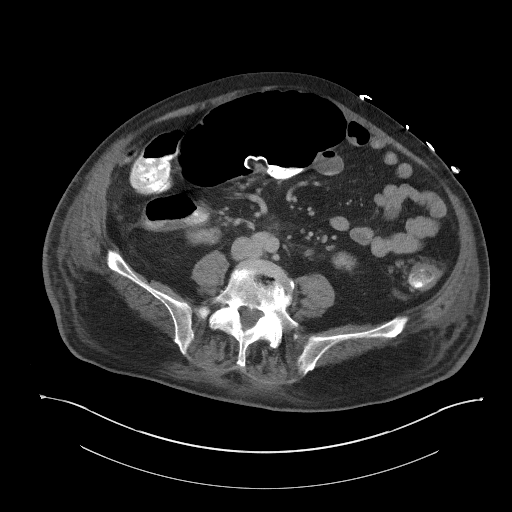
[im 54/96  soft-tissue]
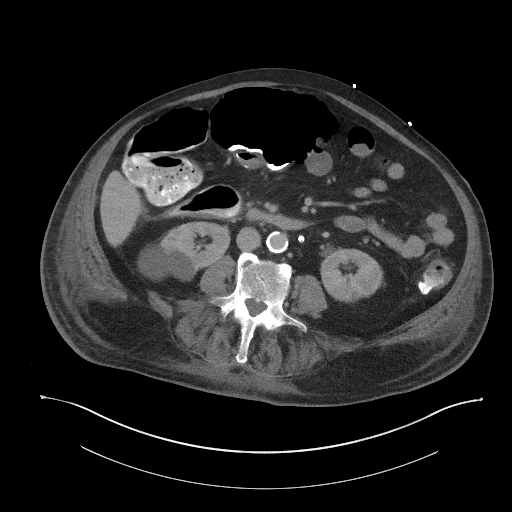
[im 60/96  soft-tissue]
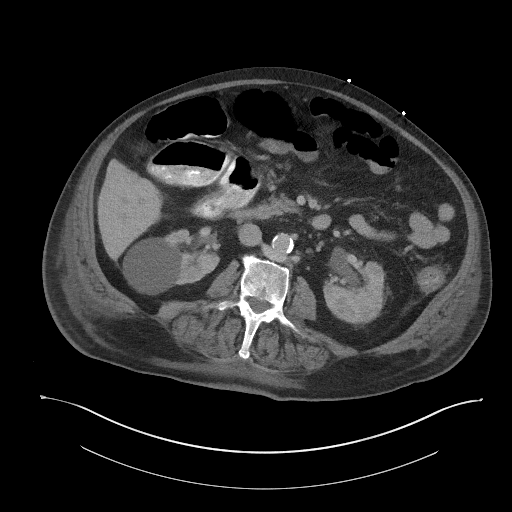
[im 60/96  bone]
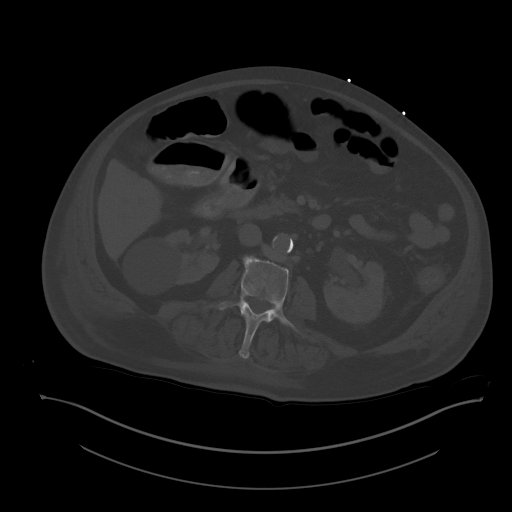
[im 66/96  soft-tissue]
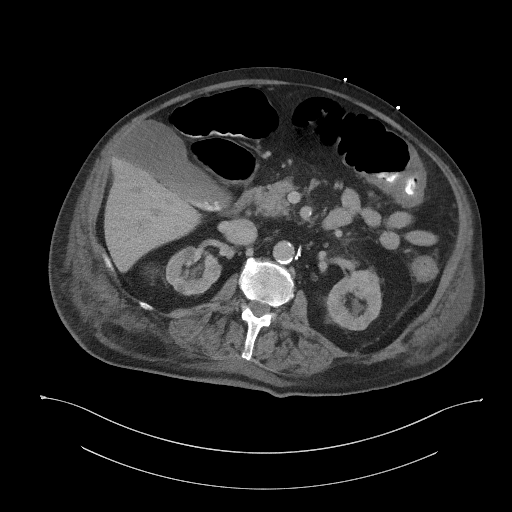
[im 78/96  soft-tissue]
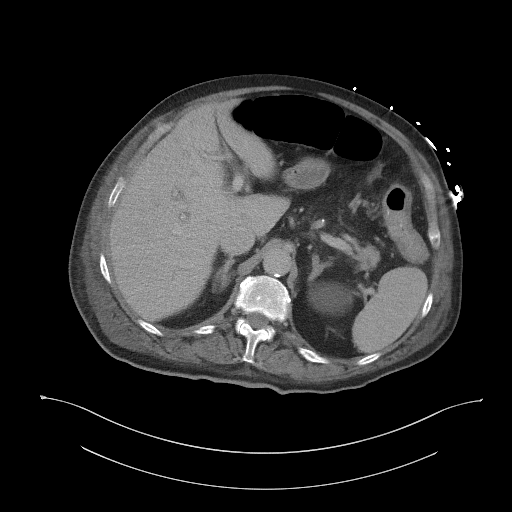
[im 84/96  soft-tissue]
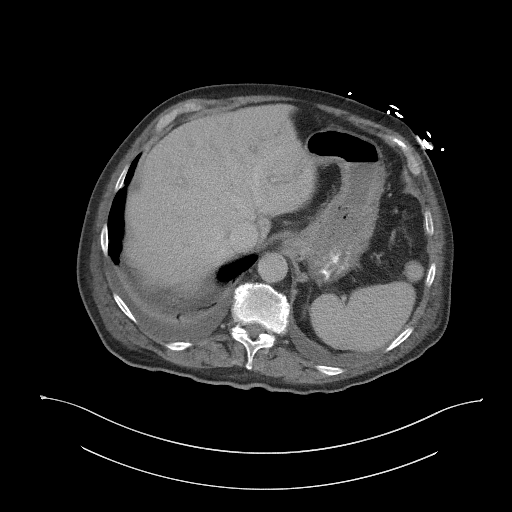
[im 90/96  soft-tissue]
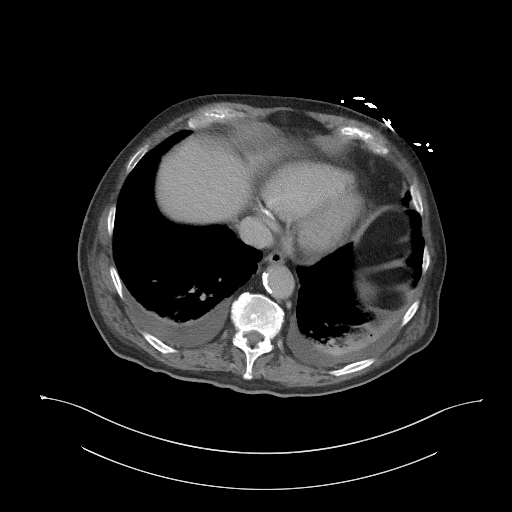

[Series 6: coronal st · coronal · 0.89mm/px · 3 of 114 slices shown]
[im 38/114  soft-tissue]
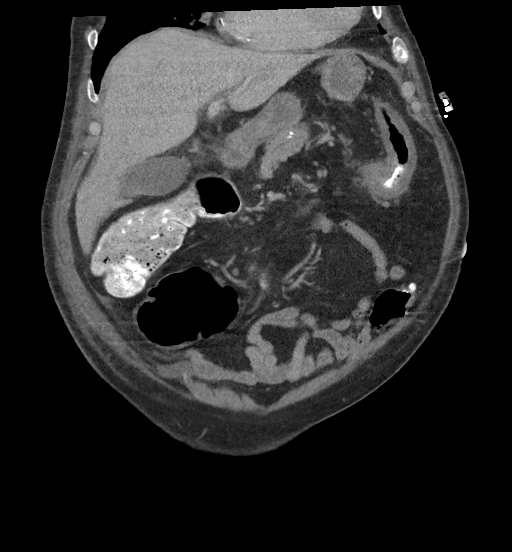
[im 51/114  soft-tissue]
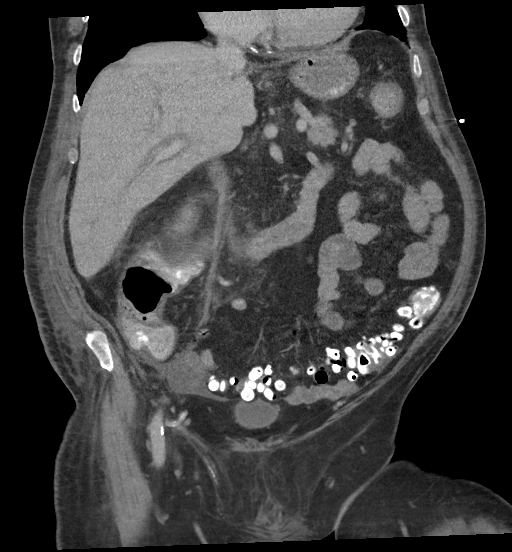
[im 63/114  soft-tissue]
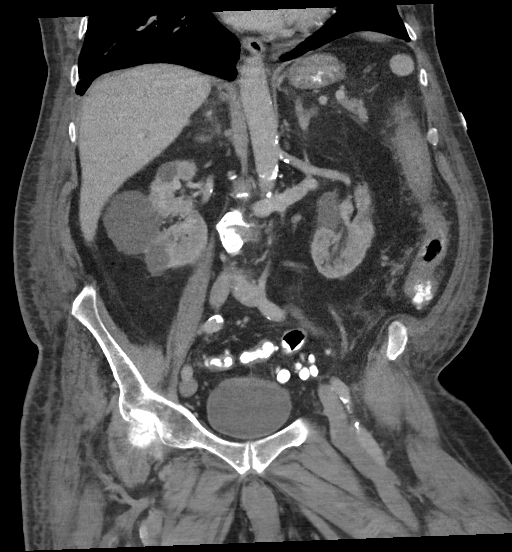

[16 of 46 positions shown; findings below may reference images not displayed]

FINDINGS: Lower chest: Small bilateral pleural effusions. Bibasilar
atelectasis. Diffuse coronary artery calcifications. Heart is normal
size.

Hepatobiliary: Gallbladder is distended. Layering stones and
possible sludge within the gallbladder. Slight indistinctness noted
around the gallbladder concerning for cholecystitis. Mild periportal
edema. No biliary ductal dilatation. Small cyst in the right hepatic
lobe 16 mm.

Pancreas: No focal abnormality or ductal dilatation.

Spleen: No focal abnormality.  Normal size.

Adrenals/Urinary Tract: Bilateral renal cysts. Slight fullness of
the left renal collecting system, stable since prior study. No
stones or overt hydronephrosis. Urinary bladder unremarkable.

Stomach/Bowel: Sigmoid and descending colonic diverticulosis. There
is wall thickening noted from the distal transverse colon through
the mid to distal descending colon concerning for colitis. Slight
surrounding inflammation. Remainder of the colon, stomach and small
bowel unremarkable.

Vascular/Lymphatic: Heavily calcified aorta and iliac vessels. No
aneurysm or adenopathy.

Reproductive: Mildly prominent prostate.

Other: No free fluid or free air.

Musculoskeletal: No acute bony abnormality.
IMPRESSION: Cholelithiasis. Haziness noted around the gallbladder. Cannot
exclude acute cholecystitis.

Continued wall thickening in the left colon from the distal
transverse colon through the distal descending colon concerning for
colitis.

Left colonic diverticulosis.

Small bilateral pleural effusions, increasing since prior study.
Bibasilar atelectasis.

Coronary artery disease, aortic atherosclerosis.

## 2022-01-29 ENCOUNTER — Encounter: Payer: Self-pay | Admitting: General Surgery

## 2022-01-29 ENCOUNTER — Non-Acute Institutional Stay (SKILLED_NURSING_FACILITY): Payer: Medicare Other | Admitting: Adult Health

## 2022-01-29 ENCOUNTER — Ambulatory Visit (INDEPENDENT_AMBULATORY_CARE_PROVIDER_SITE_OTHER): Payer: Medicare Other | Admitting: General Surgery

## 2022-01-29 ENCOUNTER — Encounter: Payer: Self-pay | Admitting: Adult Health

## 2022-01-29 VITALS — BP 162/92 | HR 80 | Temp 97.8°F | Resp 14

## 2022-01-29 DIAGNOSIS — R1031 Right lower quadrant pain: Secondary | ICD-10-CM

## 2022-01-29 DIAGNOSIS — I129 Hypertensive chronic kidney disease with stage 1 through stage 4 chronic kidney disease, or unspecified chronic kidney disease: Secondary | ICD-10-CM | POA: Diagnosis not present

## 2022-01-29 DIAGNOSIS — N183 Chronic kidney disease, stage 3 unspecified: Secondary | ICD-10-CM

## 2022-01-29 HISTORY — DX: Right lower quadrant pain: R10.31

## 2022-01-29 NOTE — Patient Instructions (Signed)
Will get a CT of the pelvis to look for inguinal hernia. No hernia was appreciated on exam today.   If you have worsening pain or notice a bulge please let someone know. If you have pain that is extreme or a hard knot/ bulge you will need to go to the hospital to get this evaluated.

## 2022-01-29 NOTE — Progress Notes (Signed)
[facility] History and Physical  Reason for Referral: Inguinal hernia?  Referring Physician: Gerlene Fee, NP; Urology Simonne Come, PA    Chief Complaint   Follow-up     Jeffrey Sherman is a 88 y.o. male.  HPI: Jeffrey Sherman is a very sweet 88 yo who is a longterm resident of the [facility]. He has been having some right sided groin pain over the last few weeks and concern for an inguinal hernia. He was seen by Urology for bladder obstruction and epididymitis that he was treated for in June.  When he saw Urology in July they were worried about a right sided inguinal hernia. They have referred him for evaluation and possible repair.   The patient denies ever having a bulge in the area. There is no documentation from his [facility] providers of them ever noticing a bulge or hernia.   Past Medical History:  Diagnosis Date   Anxiety    Cancer [facility])    prostate   Chronic chest wall pain    Essential hypertension, benign 06/20/2016   GAD (generalized anxiety disorder)    GERD (gastroesophageal reflux disease)    H/O echocardiogram 2016   normal   Heart murmur    "leaking heart valve"   High cholesterol 06/20/2016   HOH (hard of hearing)    left   Hx of falling    Interstitial pulmonary disease (HCC)    Lower GI bleed    MDD (major depressive disorder)    Murmur, cardiac    Poor historian    PUD (peptic ulcer disease)    RLS (restless legs syndrome)    Shortness of breath    Wrist fracture 12/16/2011    Past Surgical History:  Procedure Laterality Date   CHOLECYSTECTOMY N/A 06/28/2020   Procedure: LAPAROSCOPIC CHOLECYSTECTOMY;  Surgeon: [name], MD;  Location: [facility] ORS;  Service: General;  Laterality: N/A;  pt in [place]  2005   Rehman: Sigmoid colon diverticulosis, moderate sized external hemorrhoids. A few focal areas of mucosal erythema felt to be nonspecific.   ESOPHAGOGASTRODUODENOSCOPY      ESOPHAGOGASTRODUODENOSCOPY N/A 07/18/2016   Rehman: normal exam except duodenal scar. h.pylori serologies negative   FLEXIBLE SIGMOIDOSCOPY N/A 06/11/2020   Procedure: FLEXIBLE SIGMOIDOSCOPY;  Surgeon: Eloise Harman, DO;  Location: [facility] END[facility];  Service: Endoscopy;  Laterality: N/A;   HARDWARE REMOVAL Right 03/20/2021   Procedure: HARDWARE REMOVAL right hip;  Surgeon: Carole Civil, MD;  Location: [facility] ORS;  Service: Orthopedics;  Laterality: Right;   HIP PINNING,CANNULATED Right 03/10/2020   Procedure: INTERNAL FIXATION RIGHT HIP;  Surgeon: Carole Civil, MD;  Location: [facility] OR[facility];  Service: Orthopedics;  Laterality: Right;   ORIF WRIST FRACTURE  12/19/2011   Procedure: OPEN REDUCTION INTERNAL FIXATION (ORIF) WRIST FRACTURE;  Surgeon: Carole Civil, MD;  Location: [facility] OR[facility];  Service: Orthopedics;  Laterality: Right;   prostate cancer     Diagnosed in the 90s   TONSILLECTOMY      Family History  Problem Relation Age of Onset   Cancer Other    Heart attack Mother    Cancer Brother    Heart attack Brother    Heart disease Sister        by pass    Social History   Tobacco Use   Smoking status: Never   Smokeless tobacco: Never  Vaping Use   Vaping Use: Never used  Substance Use Topics  Alcohol use: No   Drug use: No    Medications: I have reviewed the patient's current medications. Current Outpatient Medications on File Prior to Visit  Medication Sig Dispense Refill   acetaminophen (TYLENOL) 500 MG tablet Take 500 mg by mouth every 6 (six) hours. Max 3033m in 24 hour period     alfuzosin (UROXATRAL) 10 MG 24 hr tablet Take 10 mg by mouth daily. 6 pm     Ascorbic Acid (VITAMIN C) 1000 MG tablet Take 1,000 mg by mouth daily.     Balsam [place]-Castor Oil (VENELEX) OINT Apply 1 application topically every 8 (eight) hours as needed (Apply to bilateral buttocks & sacral area qshift for blanchable erythema.).     calcium carbonate (TUMS - DOSED IN MG ELEMENTAL CALCIUM) 500  MG chewable tablet Chew 1 tablet by mouth 2 (two) times daily as needed for indigestion or heartburn.      diltiazem (CARDIZEM CD) 360 MG 24 hr capsule Take 1 capsule (360 mg total) by mouth daily. 30 capsule 3   Flax Oil-Fish Oil-Borage Oil (FISH OIL-FLAX OIL-BORAGE OIL) CAPS Take 1 capsule by mouth daily.     hydrALAZINE (APRESOLINE) 10 MG tablet Take 10 mg by mouth 3 (three) times daily. Hypertensive chronic kidney disease with stage 1 through stage 4 chronic kidney disease, or unspecified chronic kidney disease     isosorbide mononitrate (IMDUR) 60 MG 24 hr tablet Take 60 mg by mouth daily.     lisinopril (ZESTRIL) 20 MG tablet Take 20 mg by mouth daily. Hypertensive chronic kidney disease with stage 1 through stage 4 chronic kidney disease, or unspecified chronic kidney disease     loperamide (IMODIUM A-D) 2 MG tablet Take 2 mg by mouth as needed (after each stool. (do not exceed more than 4 tabs in 24 hours)).     loratadine (CLARITIN) 10 MG tablet Take 10 mg by mouth daily.     Melatonin 10 MG TABS Take 10 mg by mouth at bedtime.     meloxicam (MOBIC) 7.5 MG tablet Take 7.5 mg by mouth daily. give for left hip pain     memantine (NAMENDA) 10 MG tablet Take 10 mg by mouth 2 (two) times daily.     methocarbamol (ROBAXIN) 500 MG tablet Take 500 mg by mouth daily.     metoprolol tartrate (LOPRESSOR) 25 MG tablet Take 1 tablet (25 mg total) by mouth 2 (two) times daily.     omeprazole (PRILOSEC) 20 MG capsule Take 1 capsule (20 mg total) by mouth daily before breakfast. 30 capsule 5   ondansetron (ZOFRAN) 8 MG tablet Take 8 mg by mouth as needed for nausea.     OXYGEN Inhale 2 L into the lungs as needed (To keep O2 ststa > 90 %).     polyethylene glycol (MIRALAX / GLYCOLAX) 17 g packet Take 17 g by mouth daily. On Tuesday, Thursday, and Saturday. At Bedtime     potassium chloride SA (KLOR-CON) 20 MEQ tablet Take 20 mEq by mouth 2 (two) times daily.     QUEtiapine (SEROQUEL) 25 MG tablet Take 25  mg by mouth 2 (two) times daily.     rOPINIRole (REQUIP) 0.25 MG tablet Take 0.25 mg by mouth at bedtime.     senna-docusate (SENOKOT-S) 8.6-50 MG tablet Take 1 tablet by mouth at bedtime as needed for mild constipation.     simethicone (MYLICON) 1235MG chewable tablet Chew 125 mg by mouth 3 (three) times daily as needed ("increased gas").  UNABLE TO FIND Diet:Regular     venlafaxine XR (EFFEXOR-XR) 150 MG 24 hr capsule Take 150 mg by mouth daily with breakfast.     Vibegron (GEMTESA) 75 MG TABS Take 1 tablet by mouth daily as needed.     vitamin B-12 (CYANOCOBALAMIN) 1000 MCG tablet Take 1,000 mcg by mouth daily. for vitamin B 12 level 205     No current facility-administered medications on file prior to visit.    Allergies as of 01/29/2022       Reactions   Hctz [hydrochlorothiazide]    hypokalemia   Xanax [alprazolam] Other (See Comments)   Causes hallucinations         Medication List      Notice   This visit is during an admission. Changes to the med list made in this visit will be reflected in the After Visit Summary of the admission.      ROS:  A comprehensive review of systems was negative except for: Gastrointestinal: positive for right groin pain  Blood pressure (!) 162/92, pulse 80, temperature 97.8 F (36.6 C), temperature source Oral, resp. rate 14, SpO2 96 %. Physical Exam Vitals reviewed.  HENT:     Head: Normocephalic.     Nose: Nose normal.  Eyes:     Extraocular Movements: Extraocular movements intact.  Cardiovascular:     Rate and Rhythm: Normal rate.  Pulmonary:     Effort: Pulmonary effort is normal.     Breath sounds: Normal breath sounds.  Abdominal:     General: There is no distension.     Palpations: Abdomen is soft.     Tenderness: There is abdominal tenderness.     Comments: Right groin pain but no obvious hernia on exam laying flat or standing   Musculoskeletal:     Comments: Difficulty standing, weak  Skin:    General: Skin is  warm.  Neurological:     Mental Status: He is alert. Mental status is at baseline.  Psychiatric:        Mood and Affect: Mood normal.        Behavior: Behavior normal.     Results: CT 2021- reviewed no right sided hernia noted at that time   Assessment & Plan:  Jeffrey Sherman is a 88 y.o. male with concern for a right sided inguinal hernia with groin pain but no hernia on my exam. This could be a small hernia that I just do not appreciate today. Discussed options of returning for another exam in a few weeks versus CT of the pelvis to evaluate this further. His daughter in law agrees the CT pelvis would give them more reassurance and help with their planning. Discussed that even if he has a hernia we do not have to fix it unless it is causing him significant pain or we are worried about incarceration. He is 53 and did well with surgeries 2 years ago, but is getting older. Discussed that his dementia is worsening, and that we will only proceed with surgery if needed.   CT pelvis ordered  If you have worsening pain or notice a bulge please let someone know. If you have pain that is extreme or a hard knot/ bulge you will need to go to the hospital to get this evaluated.   All questions were answered to the satisfaction of the patient and family.   [name] 01/29/2022, 9:49 AM

## 2022-01-29 NOTE — Progress Notes (Unsigned)
Location:  [place] Room Number: 156 D Place of Service:  SNF (31) Provider:  Ok Edwards, NP   CODE STATUS: DNR  Allergies  Allergen Reactions   Hctz [Hydrochlorothiazide]     hypokalemia   Xanax [Alprazolam] Other (See Comments)    Causes hallucinations     Chief Complaint  Patient presents with   Acute Visit    Hypertension    HPI:  His blood pressure readings are elevated. He is presently taking lisinopril 30 mg daily; hydralazine 10 mg three times daily; cardizem cd 300 mg daily; imdur 60 mg daily; lopressor 25 mg daily. He is tolerating this medications without difficulty. There are no reports of dizziness; headaches of vision changes.    Past Medical History:  Diagnosis Date   Anxiety    Cancer [facility])    prostate   Chronic chest wall pain    Essential hypertension, benign 06/20/2016   GAD (generalized anxiety disorder)    GERD (gastroesophageal reflux disease)    H/O echocardiogram 2016   normal   Heart murmur    "leaking heart valve"   High cholesterol 06/20/2016   HOH (hard of hearing)    left   Hx of falling    Interstitial pulmonary disease (HCC)    Lower GI bleed    MDD (major depressive disorder)    Murmur, cardiac    Poor historian    PUD (peptic ulcer disease)    RLS (restless legs syndrome)    Shortness of breath    Wrist fracture 12/16/2011    Past Surgical History:  Procedure Laterality Date   CHOLECYSTECTOMY N/A 06/28/2020   Procedure: LAPAROSCOPIC CHOLECYSTECTOMY;  Surgeon: [name], MD;  Location: [facility];  Service: General;  Laterality: N/A;  pt in [place]  2005   Rehman: Sigmoid colon diverticulosis, moderate sized external hemorrhoids. A few focal areas of mucosal erythema felt to be nonspecific.   ESOPHAGOGASTRODUODENOSCOPY     ESOPHAGOGASTRODUODENOSCOPY N/A 07/18/2016   Rehman: normal exam except duodenal scar. h.pylori serologies negative   FLEXIBLE SIGMOIDOSCOPY N/A 06/11/2020    Procedure: FLEXIBLE SIGMOIDOSCOPY;  Surgeon: Eloise Harman, DO;  Location: [facility];  Service: Endoscopy;  Laterality: N/A;   HARDWARE REMOVAL Right 03/20/2021   Procedure: HARDWARE REMOVAL right hip;  Surgeon: Carole Civil, MD;  Location: [facility] OR[facility];  Service: Orthopedics;  Laterality: Right;   HIP PINNING,CANNULATED Right 03/10/2020   Procedure: INTERNAL FIXATION RIGHT HIP;  Surgeon: Carole Civil, MD;  Location: [facility] OR[facility];  Service: Orthopedics;  Laterality: Right;   ORIF WRIST FRACTURE  12/19/2011   Procedure: OPEN REDUCTION INTERNAL FIXATION (ORIF) WRIST FRACTURE;  Surgeon: Carole Civil, MD;  Location: [facility];  Service: Orthopedics;  Laterality: Right;   prostate cancer     Diagnosed in the 90s   TONSILLECTOMY      Social History   Socioeconomic History   Marital status: Widowed    Spouse name: Not on file   Number of children: Not on file   Years of education: Not on file   Highest education level: Not on file  Occupational History   Occupation: retired    Fish farm manager: RETIRED  Tobacco Use   Smoking status: Never   Smokeless tobacco: Never  Vaping Use   Vaping Use: Never used  Substance and Sexual Activity   Alcohol use: No   Drug use: No   Sexual activity: Not Currently  Other Topics Concern  Not on file  Social History Narrative   Is long term resident of [facility]    Social Determinants of Health   Financial Resource Strain: Not on file  Food Insecurity: Not on file  Transportation Needs: Not on file  Physical Activity: Not on file  Stress: Not on file  Social Connections: Not on file  Intimate Partner Violence: Not on file   Family History  Problem Relation Age of Onset   Cancer Other    Heart attack Mother    Cancer Brother    Heart attack Brother    Heart disease Sister        by pass      VITAL SIGNS BP (!) 145/57   Pulse 74   Temp 98.8 F (37.1 C)   Resp 18   Ht 5' 9"  (1.753 m)   Wt 177 lb 6.4 oz (80.5 kg)   SpO2 98%    BMI 26.20 kg/m   Outpatient Encounter Medications as of 01/29/2022  Medication Sig   acetaminophen (TYLENOL) 500 MG tablet Take 500 mg by mouth every 6 (six) hours. Max 3024m in 24 hour period   alfuzosin (UROXATRAL) 10 MG 24 hr tablet Take 10 mg by mouth daily. 6 pm   Ascorbic Acid (VITAMIN C) 1000 MG tablet Take 1,000 mg by mouth daily.   Balsam [place]-Castor Oil (VENELEX) OINT Apply 1 application topically every 8 (eight) hours as needed (Apply to bilateral buttocks & sacral area qshift for blanchable erythema.).   calcium carbonate (TUMS - DOSED IN MG ELEMENTAL CALCIUM) 500 MG chewable tablet Chew 1 tablet by mouth 2 (two) times daily as needed for indigestion or heartburn.    diltiazem (CARDIZEM CD) 360 MG 24 hr capsule Take 1 capsule (360 mg total) by mouth daily.   Flax Oil-Fish Oil-Borage Oil (FISH OIL-FLAX OIL-BORAGE OIL) CAPS Take 1 capsule by mouth daily.   hydrALAZINE (APRESOLINE) 10 MG tablet Take 10 mg by mouth 3 (three) times daily. Hypertensive chronic kidney disease with stage 1 through stage 4 chronic kidney disease, or unspecified chronic kidney disease   isosorbide mononitrate (IMDUR) 60 MG 24 hr tablet Take 60 mg by mouth daily.   lisinopril (ZESTRIL) 40 MG tablet Take 40 mg by mouth daily. Hypertensive chronic kidney disease with stage 1 through stage 4 chronic kidney disease, or unspecified chronic kidney disease   loperamide (IMODIUM A-D) 2 MG tablet Take 2 mg by mouth as needed (after each stool. (do not exceed more than 4 tabs in 24 hours)).   loratadine (CLARITIN) 10 MG tablet Take 10 mg by mouth daily.   Melatonin 10 MG TABS Take 10 mg by mouth at bedtime.   meloxicam (MOBIC) 7.5 MG tablet Take 7.5 mg by mouth daily. give for left hip pain   memantine (NAMENDA) 10 MG tablet Take 10 mg by mouth 2 (two) times daily.   methocarbamol (ROBAXIN) 500 MG tablet Take 500 mg by mouth daily.   metoprolol tartrate (LOPRESSOR) 25 MG tablet Take 1 tablet (25 mg total) by mouth 2  (two) times daily.   omeprazole (PRILOSEC) 20 MG capsule Take 1 capsule (20 mg total) by mouth daily before breakfast.   ondansetron (ZOFRAN) 8 MG tablet Take 8 mg by mouth as needed for nausea.   OXYGEN Inhale 2 L into the lungs as needed (To keep O2 ststa > 90 %).   polyethylene glycol (MIRALAX / GLYCOLAX) 17 g packet Take 17 g by mouth daily. On Tuesday, Thursday, and Saturday. At  Bedtime   potassium chloride SA (KLOR-CON) 20 MEQ tablet Take 20 mEq by mouth 2 (two) times daily.   QUEtiapine (SEROQUEL) 25 MG tablet Take 25 mg by mouth 2 (two) times daily.   rOPINIRole (REQUIP) 0.25 MG tablet Take 0.25 mg by mouth at bedtime.   senna-docusate (SENOKOT-S) 8.6-50 MG tablet Take 1 tablet by mouth at bedtime as needed for mild constipation.   simethicone (MYLICON) 735 MG chewable tablet Chew 125 mg by mouth 3 (three) times daily as needed ("increased gas").   UNABLE TO FIND Diet:Regular   venlafaxine XR (EFFEXOR-XR) 150 MG 24 hr capsule Take 150 mg by mouth daily with breakfast.   Vibegron (GEMTESA) 75 MG TABS Take 1 tablet by mouth daily as needed.   vitamin B-12 (CYANOCOBALAMIN) 1000 MCG tablet Take 1,000 mcg by mouth daily. for vitamin B 12 level 205   No facility-administered encounter medications on file as of 01/29/2022.     SIGNIFICANT DIAGNOSTIC EXAMS  PREVIOUS   09-10-21: chest x-ray: patch asymmetric inflammatory changes. These could represent areas of atypical infection; focal atelectasis infarction or evolving consolidation among other possibilities.  11-15-21: scrotal ultrasounds No evidence of intratesticular   mass or torsion bilateral  No evidence of varicoceles bilateral  Small sized right hydrocele Bilateral epididymal head is enlarges may represent epididymitis   TODAY  01-23-22: dexa scan: t score -1.331.    LABS REVIEWED PREVIOUS     01-08-21: k+ 4.5 01-19-21: glucose 108; bun 20; creat 0.96; k+ 3.6; na++ 139; ca 9.0 GFR>60 02-05-21: glucose 109; bun 25; creat 1.05; k+  4.5; na++ 141; ca 9.5; GFR>60.  03-13-21: wbc 7.3; hgb 14.0; hct 42.5; mcv 85.5 plt 212; glucose 126; bun 24; creat 1.03 ;k+ 3.9; na++ 139; ca 9.6; GFR>60 03-15-21: hgb a1c 5.7 03-20-21: wbc 20.3; hgb 14.6; hct 44.4; mcv 86.7 plt 229; glcusoe 201; bun 26; creat 1.44; k+ 4.2; na++ 138; ca 9.2; GFR 46  03-22-21: aldosterone 7.6; cortisol AM 19.5;  03-26-21: wbc 6.3; hgb 12.4; hct 37.9; mcv 87.5 plt 230; glucose 117; bun 28; creat 1.13; k+ 4.4; na++ 138; ca 9.2; GFR>60  04-23-21: PSA 0.6; testosterone: <3 08-10-21: PSA 0.17 testosterone <3 08-16-21: wbc 6.0; hgb 12.0; hct 37.1; mcv 89.0 plt 181; glucose 98; bun 40; creat 1.46; k+ 5.7; na++ 138; ca 9.3 GFR>46 protein 5.8; albumin 3.4; tsh 0.620 08-24-21: glucose 104; bun 46; creat 2.07; k+ 4.7; na++ 141; ca 8.5; GFR 30  08-30-21: vitamin B12  205  09-10-21: wbc 8.5; hgb 12.8; hct 38.5; mcv 87.3; plt 167; glucose 131; bun 25; creat 1.20; k+ 3.9; na++ 140; ca 9.6; GFR 58; iron 38; tibc 261; folate 14.2; CRP 6.7; d-dimer 0.43 09-17-21: CRP <0.5 10-18-21: wbc 6.1; hgb 13.3; hct 41.5; mcv 89.7 plt 190; glucose 91; bun 25; creat 0.95; k+ 3.4; na++ 142; ca 9.3; gfr >60; protein 6.0; albumin 3.5  NO NEW LABS.    Review of Systems  Constitutional:  Negative for malaise/fatigue.  Respiratory:  Negative for cough and shortness of breath.   Cardiovascular:  Negative for chest pain, palpitations and leg swelling.  Gastrointestinal:  Negative for abdominal pain, constipation and heartburn.  Musculoskeletal:  Negative for back pain, joint pain and myalgias.  Skin: Negative.   Neurological:  Negative for dizziness.  Psychiatric/Behavioral:  The patient is not nervous/anxious.     Physical Exam Constitutional:      General: He is not in acute distress.    Appearance: He is well-developed. He is not  diaphoretic.  Neck:     Thyroid: No thyromegaly.  Cardiovascular:     Rate and Rhythm: Normal rate and regular rhythm.     Pulses: Normal pulses.     Heart sounds:  Normal heart sounds.  Pulmonary:     Effort: Pulmonary effort is normal. No respiratory distress.     Breath sounds: Normal breath sounds.  Abdominal:     General: Bowel sounds are normal. There is no distension.     Palpations: Abdomen is soft.     Tenderness: There is no abdominal tenderness.  Musculoskeletal:        General: Normal range of motion.     Cervical back: Neck supple.     Right lower leg: No edema.     Left lower leg: No edema.  Lymphadenopathy:     Cervical: No cervical adenopathy.  Skin:    General: Skin is warm and dry.  Neurological:     Mental Status: He is alert. Mental status is at baseline.  Psychiatric:        Mood and Affect: Mood normal.       ASSESSMENT/ PLAN:  TODAY  Benign hypertension with chronic kidney disease stage 3: b/p 145/57 will increase lisinopril 40 mg daily and will repeat BMP on 02-11-22   Ok Edwards NP [facility]  call [phone]

## 2022-01-30 DIAGNOSIS — F331 Major depressive disorder, recurrent, moderate: Secondary | ICD-10-CM | POA: Diagnosis not present

## 2022-01-31 ENCOUNTER — Encounter (HOSPITAL_COMMUNITY)
Admission: RE | Admit: 2022-01-31 | Discharge: 2022-01-31 | Disposition: A | Discharge status: Home / Self Care | Payer: Medicare Other | Source: Skilled Nursing Facility | Attending: Adult Health | Admitting: Adult Health

## 2022-01-31 DIAGNOSIS — I129 Hypertensive chronic kidney disease with stage 1 through stage 4 chronic kidney disease, or unspecified chronic kidney disease: Secondary | ICD-10-CM | POA: Insufficient documentation

## 2022-01-31 LAB — BASIC METABOLIC PANEL
Anion gap: 6 (ref 5–15)
BUN: 20 mg/dL (ref 8–23)
CO2: 27 mmol/L (ref 22–32)
Calcium: 9.5 mg/dL (ref 8.9–10.3)
Chloride: 110 mmol/L (ref 98–111)
Creatinine, Ser: 0.94 mg/dL (ref 0.61–1.24)
GFR, Estimated: >60 mL/min (ref >60)
Glucose, Bld: 100 mg/dL — ABNORMAL HIGH (ref 70–99)
Potassium: 3.5 mmol/L (ref 3.5–5.1)
Sodium: 143 mmol/L (ref 135–145)

## 2022-02-06 DIAGNOSIS — F331 Major depressive disorder, recurrent, moderate: Secondary | ICD-10-CM | POA: Diagnosis not present

## 2022-02-10 IMAGING — DX DG CHEST 2V
2 series · 2 of 2 positions shown · non-contrast
Comparison: 06/13/2020

CLINICAL DATA: Shortness of breath

EXAM:
CHEST - 2 VIEW

[chest lat]
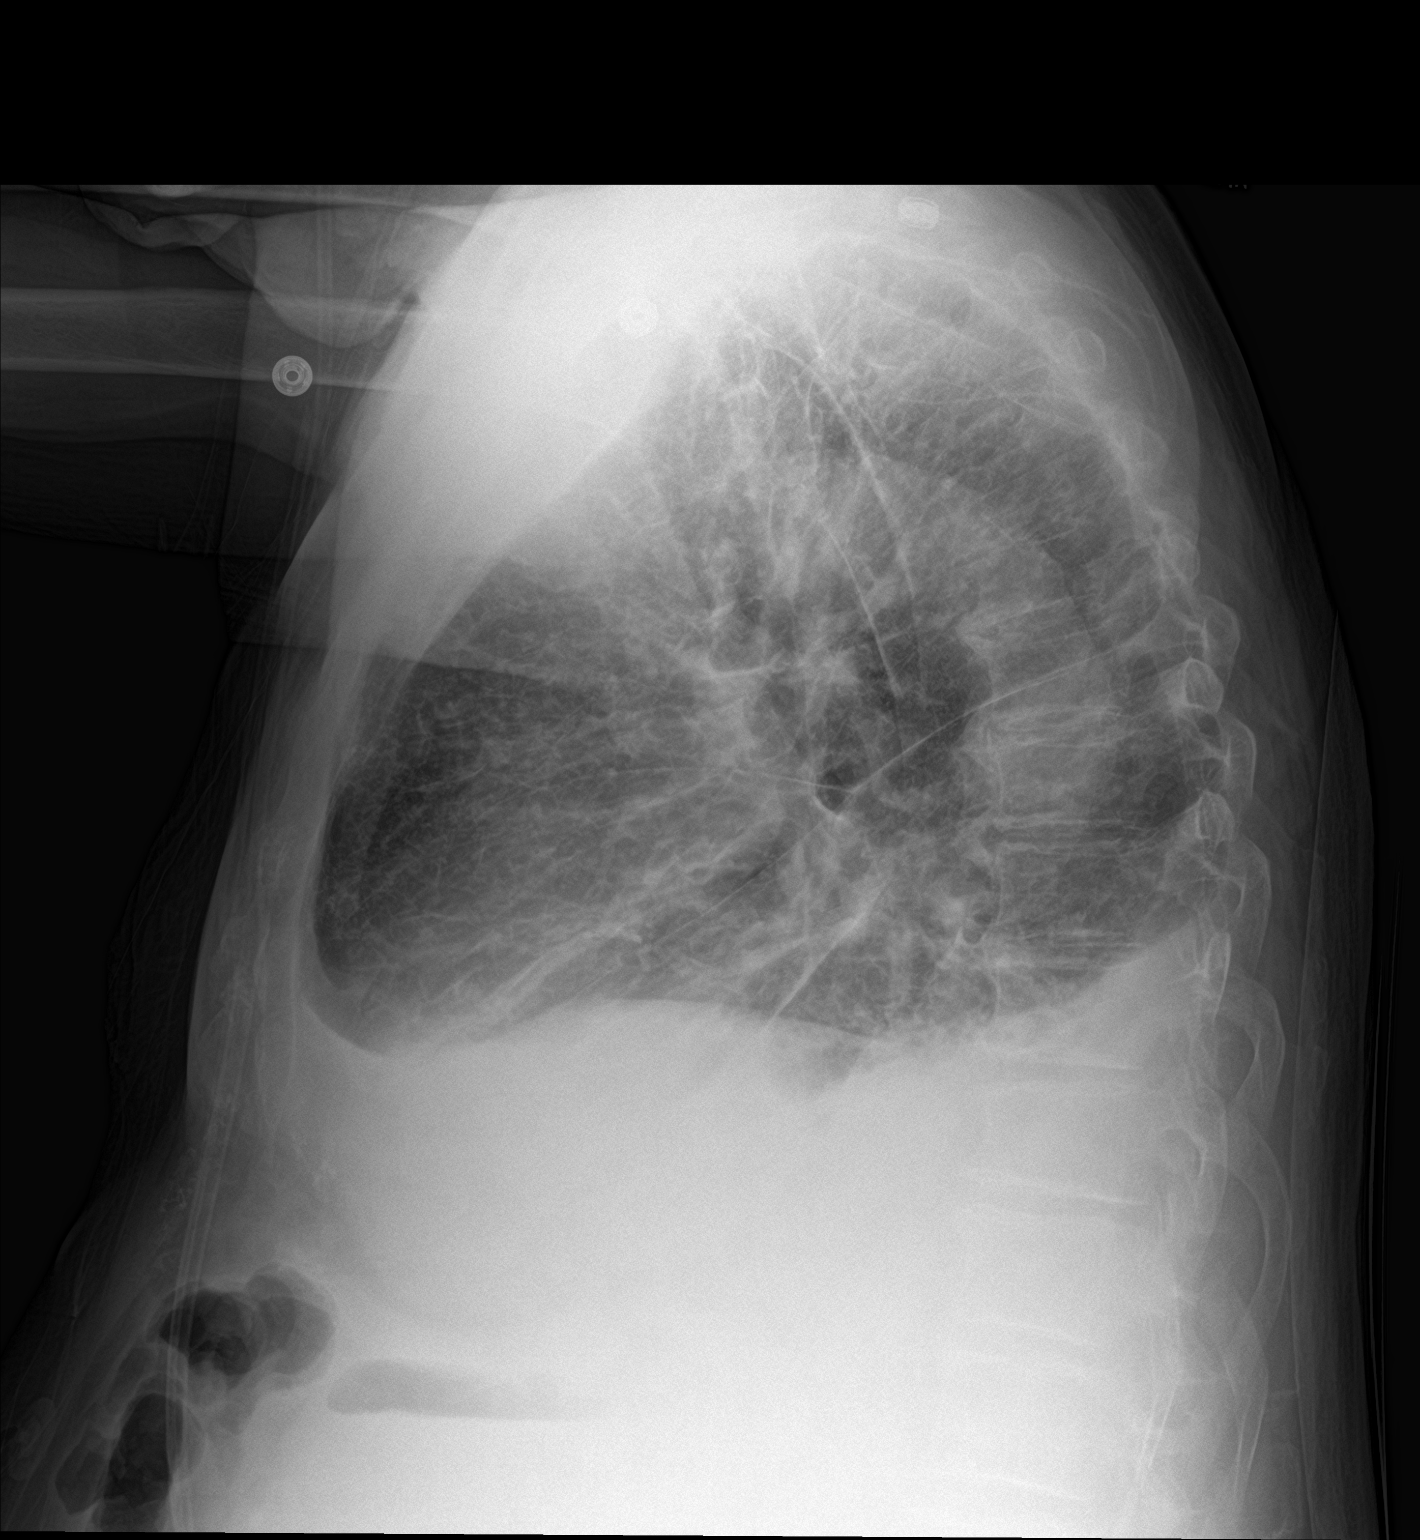

[chest ap]
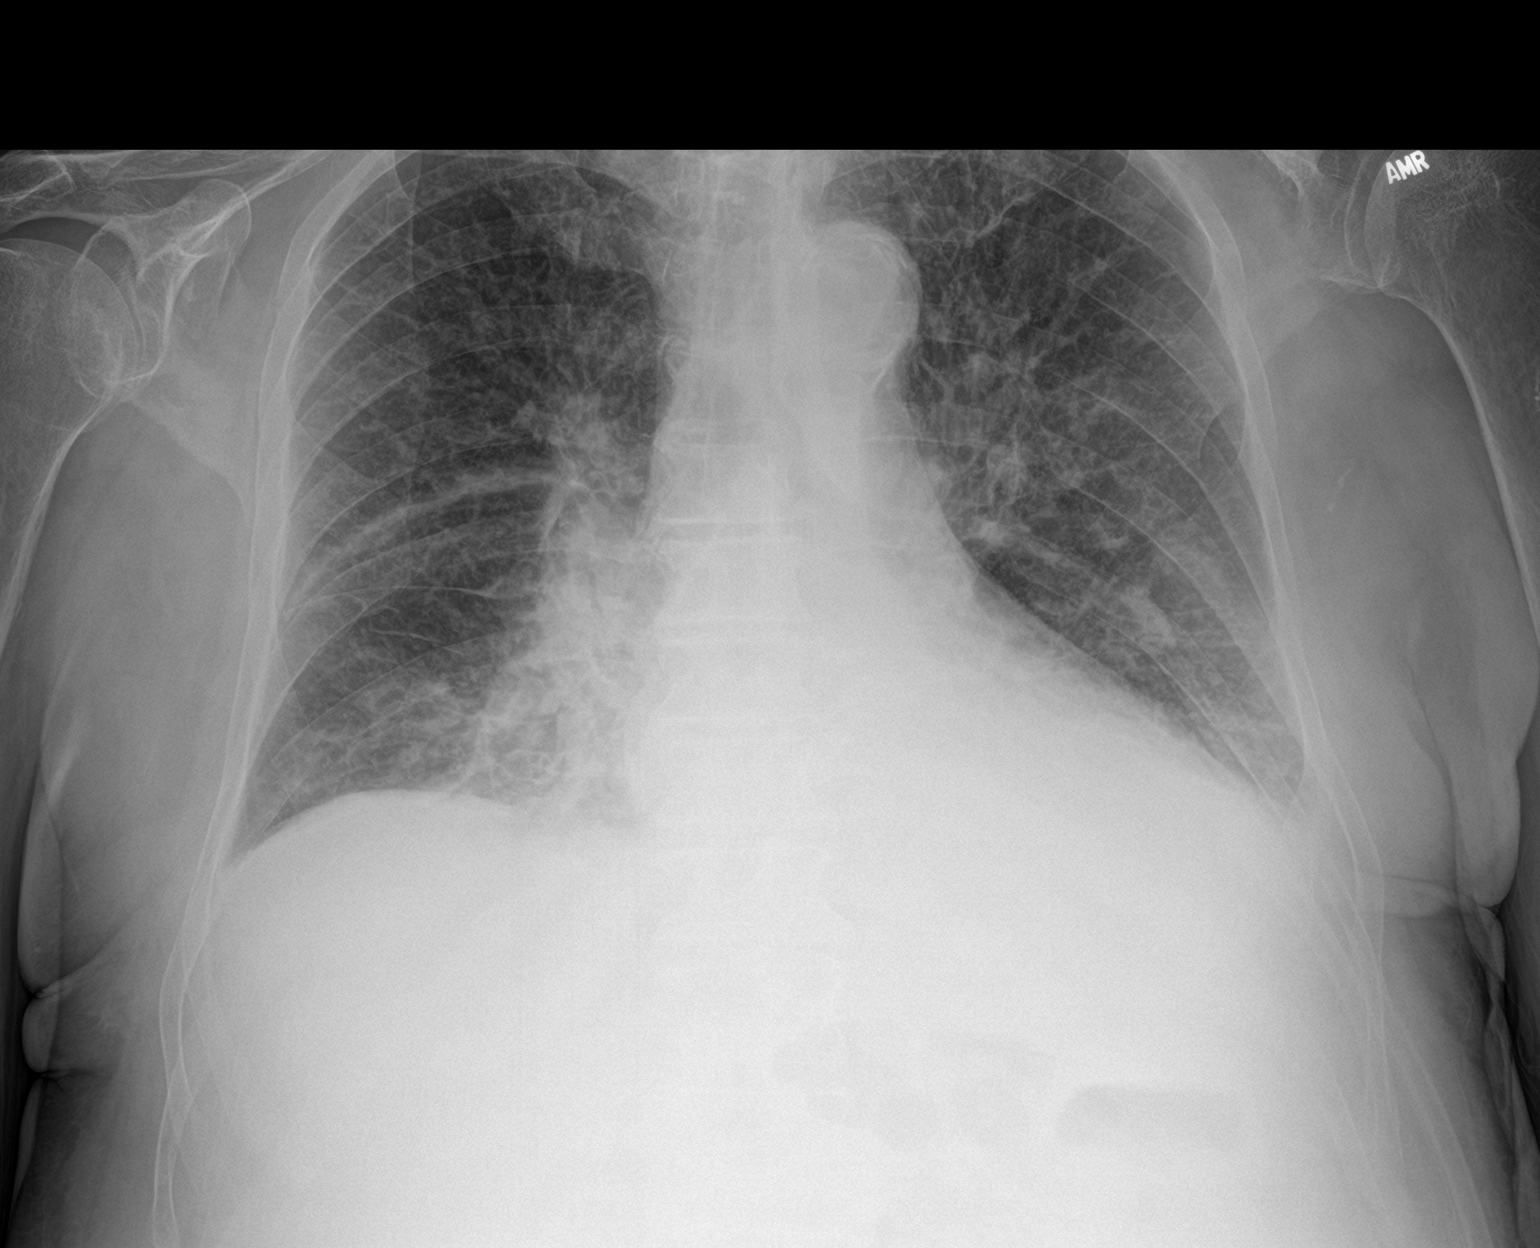

[2 of 2 positions shown; findings below may reference images not displayed]

FINDINGS: No significant change in chest radiographs with diffuse bilateral
interstitial pulmonary opacity, small bilateral pleural effusions,
and cardiomegaly. No new or focal airspace opacity. Disc
degenerative disease of the thoracic spine.
IMPRESSION: No significant change in chest radiographs with diffuse bilateral
interstitial pulmonary opacity, small bilateral pleural effusions,
and cardiomegaly, findings consistent with edema. No new or focal
airspace opacity.

## 2022-02-11 ENCOUNTER — Encounter (HOSPITAL_COMMUNITY)
Admission: RE | Admit: 2022-02-11 | Discharge: 2022-02-11 | Disposition: A | Discharge status: Home / Self Care | Payer: Medicare Other | Source: Skilled Nursing Facility | Attending: Adult Health | Admitting: Adult Health

## 2022-02-11 DIAGNOSIS — I129 Hypertensive chronic kidney disease with stage 1 through stage 4 chronic kidney disease, or unspecified chronic kidney disease: Secondary | ICD-10-CM | POA: Insufficient documentation

## 2022-02-11 LAB — BASIC METABOLIC PANEL
Anion gap: 4 — ABNORMAL LOW (ref 5–15)
BUN: 19 mg/dL (ref 8–23)
CO2: 29 mmol/L (ref 22–32)
Calcium: 9.3 mg/dL (ref 8.9–10.3)
Chloride: 108 mmol/L (ref 98–111)
Creatinine, Ser: 0.91 mg/dL (ref 0.61–1.24)
GFR, Estimated: >60 mL/min (ref >60)
Glucose, Bld: 102 mg/dL — ABNORMAL HIGH (ref 70–99)
Potassium: 3.3 mmol/L — ABNORMAL LOW (ref 3.5–5.1)
Sodium: 141 mmol/L (ref 135–145)

## 2022-02-13 ENCOUNTER — Encounter: Payer: Self-pay | Admitting: Adult Health

## 2022-02-13 ENCOUNTER — Non-Acute Institutional Stay (SKILLED_NURSING_FACILITY): Payer: Medicare Other | Admitting: Adult Health

## 2022-02-13 DIAGNOSIS — I700 Atherosclerosis of aorta: Secondary | ICD-10-CM | POA: Diagnosis not present

## 2022-02-13 DIAGNOSIS — G8929 Other chronic pain: Secondary | ICD-10-CM | POA: Diagnosis not present

## 2022-02-13 DIAGNOSIS — E441 Mild protein-calorie malnutrition: Secondary | ICD-10-CM | POA: Diagnosis not present

## 2022-02-13 DIAGNOSIS — F0390 Unspecified dementia without behavioral disturbance: Secondary | ICD-10-CM

## 2022-02-13 DIAGNOSIS — F331 Major depressive disorder, recurrent, moderate: Secondary | ICD-10-CM | POA: Diagnosis not present

## 2022-02-13 DIAGNOSIS — M25552 Pain in left hip: Secondary | ICD-10-CM | POA: Diagnosis not present

## 2022-02-13 NOTE — Progress Notes (Signed)
Location:  [place] Room Number: 156-D Place of Service:  SNF (31) Provider: Ok Edwards, NP  CODE STATUS: DNR  Allergies  Allergen Reactions   Hctz [Hydrochlorothiazide]     hypokalemia   Xanax [Alprazolam] Other (See Comments)    Causes hallucinations     Chief Complaint  Patient presents with   Medical Management of Chronic Issues                                     Left hip pain:  Dementia without behavioral disturbance unspecified dementia type: Aortic atherosclerosis  Protein calorie malnutrition:    HPI:  He is a 88 year old long term resident of this facility being seen for the management of his chronic illnesses:  Left hip pain:  Dementia without behavioral disturbance unspecified dementia type: Aortic atherosclerosis  Protein calorie malnutrition. He does spend a lot of his time in bed and room per his choice. He denies any uncontrolled pain. He states that his appetite is good; weight is stable.   Past Medical History:  Diagnosis Date   Anxiety    Cancer [facility])    prostate   Chronic chest wall pain    Essential hypertension, benign 06/20/2016   GAD (generalized anxiety disorder)    GERD (gastroesophageal reflux disease)    H/O echocardiogram 2016   normal   Heart murmur    "leaking heart valve"   High cholesterol 06/20/2016   HOH (hard of hearing)    left   Hx of falling    Interstitial pulmonary disease (HCC)    Lower GI bleed    MDD (major depressive disorder)    Murmur, cardiac    Poor historian    PUD (peptic ulcer disease)    RLS (restless legs syndrome)    Shortness of breath    Wrist fracture 12/16/2011    Past Surgical History:  Procedure Laterality Date   CHOLECYSTECTOMY N/A 06/28/2020   Procedure: LAPAROSCOPIC CHOLECYSTECTOMY;  Surgeon: [name], MD;  Location: [facility] OR[facility];  Service: General;  Laterality: N/A;  pt in [place]  2005   Rehman: Sigmoid colon diverticulosis, moderate sized  external hemorrhoids. A few focal areas of mucosal erythema felt to be nonspecific.   ESOPHAGOGASTRODUODENOSCOPY     ESOPHAGOGASTRODUODENOSCOPY N/A 07/18/2016   Rehman: normal exam except duodenal scar. h.pylori serologies negative   FLEXIBLE SIGMOIDOSCOPY N/A 06/11/2020   Procedure: FLEXIBLE SIGMOIDOSCOPY;  Surgeon: Eloise Harman, DO;  Location: [facility] ENDO SU[facility];  Service: Endoscopy;  Laterality: N/A;   HARDWARE REMOVAL Right 03/20/2021   Procedure: HARDWARE REMOVAL right hip;  Surgeon: Carole Civil, MD;  Location: [facility] ORS;  Service: Orthopedics;  Laterality: Right;   HIP PINNING,CANNULATED Right 03/10/2020   Procedure: INTERNAL FIXATION RIGHT HIP;  Surgeon: Carole Civil, MD;  Location: [facility] ORS;  Service: Orthopedics;  Laterality: Right;   ORIF WRIST FRACTURE  12/19/2011   Procedure: OPEN REDUCTION INTERNAL FIXATION (ORIF) WRIST FRACTURE;  Surgeon: Carole Civil, MD;  Location: [facility] OR[facility];  Service: Orthopedics;  Laterality: Right;   prostate cancer     Diagnosed in the 90s   TONSILLECTOMY      Social History   Socioeconomic History   Marital status: Widowed    Spouse name: Not on file   Number of children: Not on file   Years of education: Not on file  Highest education level: Not on file  Occupational History   Occupation: retired    Fish farm manager: RETIRED  Tobacco Use   Smoking status: Never   Smokeless tobacco: Never  Vaping Use   Vaping Use: Never used  Substance and Sexual Activity   Alcohol use: No   Drug use: No   Sexual activity: Not Currently  Other Topics Concern   Not on file  Social History Narrative   Is long term resident of [facility]    Social Determinants of Health   Financial Resource Strain: Not on file  Food Insecurity: Not on file  Transportation Needs: Not on file  Physical Activity: Not on file  Stress: Not on file  Social Connections: Not on file  Intimate Partner Violence: Not on file   Family History  Problem Relation Age of Onset    Cancer Other    Heart attack Mother    Cancer Brother    Heart attack Brother    Heart disease Sister        by pass      VITAL SIGNS BP (!) 141/79   Pulse 74   Temp (!) 97 F (36.1 C)   Resp 20   Ht 5' 9"  (1.753 m)   Wt 177 lb 6.4 oz (80.5 kg)   SpO2 97%   BMI 26.20 kg/m   Outpatient Encounter Medications as of 02/13/2022  Medication Sig   acetaminophen (TYLENOL) 500 MG tablet Take 500 mg by mouth every 6 (six) hours. Max 3078m in 24 hour period   alfuzosin (UROXATRAL) 10 MG 24 hr tablet Take 10 mg by mouth daily. 6 pm   Ascorbic Acid (VITAMIN C) 1000 MG tablet Take 1,000 mg by mouth daily.   Balsam [place]-Castor Oil (VENELEX) OINT Apply 1 application topically every 8 (eight) hours as needed (Apply to bilateral buttocks & sacral area qshift for blanchable erythema.).   calcium carbonate (TUMS - DOSED IN MG ELEMENTAL CALCIUM) 500 MG chewable tablet Chew 1 tablet by mouth 2 (two) times daily as needed for indigestion or heartburn.    Calcium Carbonate Antacid 600 MG chewable tablet Chew 1,500 mg by mouth daily.   Cholecalciferol (D3-1000) 25 MCG (1000 UT) tablet Take 1,000 Units by mouth daily.   dilTIAZem HCl ER 300 MG TB24 Take 1 capsule by mouth daily.   Flax Oil-Fish Oil-Borage Oil (FISH OIL-FLAX OIL-BORAGE OIL) CAPS Take 1 capsule by mouth daily.   hydrALAZINE (APRESOLINE) 10 MG tablet Take 10 mg by mouth 3 (three) times daily. Hypertensive chronic kidney disease with stage 1 through stage 4 chronic kidney disease, or unspecified chronic kidney disease   isosorbide mononitrate (IMDUR) 60 MG 24 hr tablet Take 60 mg by mouth daily.   lisinopril (ZESTRIL) 40 MG tablet Take 40 mg by mouth daily. Hypertensive chronic kidney disease with stage 1 through stage 4 chronic kidney disease, or unspecified chronic kidney disease   loperamide (IMODIUM A-D) 2 MG tablet Take 2 mg by mouth as needed (after each stool. (do not exceed more than 4 tabs in 24 hours)).   loratadine (CLARITIN) 10 MG  tablet Take 10 mg by mouth daily.   Melatonin 10 MG TABS Take 10 mg by mouth at bedtime.   meloxicam (MOBIC) 7.5 MG tablet Take 7.5 mg by mouth daily. give for left hip pain   memantine (NAMENDA) 10 MG tablet Take 10 mg by mouth 2 (two) times daily.   methocarbamol (ROBAXIN) 500 MG tablet Take 500 mg by mouth daily.  metoprolol tartrate (LOPRESSOR) 25 MG tablet Take 1 tablet (25 mg total) by mouth 2 (two) times daily.   omeprazole (PRILOSEC) 20 MG capsule Take 1 capsule (20 mg total) by mouth daily before breakfast.   ondansetron (ZOFRAN) 8 MG tablet Take 8 mg by mouth as needed for nausea.   OXYGEN Inhale 2 L into the lungs as needed (To keep O2 ststa > 90 %).   polyethylene glycol (MIRALAX / GLYCOLAX) 17 g packet Take 17 g by mouth daily. On Tuesday, Thursday, and Saturday. At Bedtime   polyethylene glycol (MIRALAX / GLYCOLAX) 17 g packet Take 17 g by mouth at bedtime.   potassium chloride SA (KLOR-CON) 20 MEQ tablet Take 20 mEq by mouth 2 (two) times daily.   QUEtiapine (SEROQUEL) 25 MG tablet Take 25 mg by mouth 2 (two) times daily.   rOPINIRole (REQUIP) 0.25 MG tablet Take 0.25 mg by mouth at bedtime.   saline (AYR) GEL Place 1 Application into both nostrils every 6 (six) hours as needed.   senna-docusate (SENOKOT-S) 8.6-50 MG tablet Take 1 tablet by mouth at bedtime as needed for mild constipation.   simethicone (MYLICON) 010 MG chewable tablet Chew 125 mg by mouth 3 (three) times daily as needed ("increased gas").   UNABLE TO FIND Diet:Regular   venlafaxine XR (EFFEXOR-XR) 150 MG 24 hr capsule Take 150 mg by mouth daily with breakfast.   Vibegron (GEMTESA) 75 MG TABS Take 1 tablet by mouth daily as needed.   vitamin B-12 (CYANOCOBALAMIN) 1000 MCG tablet Take 1,000 mcg by mouth daily. for vitamin B 12 level 205   [DISCONTINUED] diltiazem (CARDIZEM CD) 360 MG 24 hr capsule Take 1 capsule (360 mg total) by mouth daily.   No facility-administered encounter medications on file as of  02/13/2022.     SIGNIFICANT DIAGNOSTIC EXAMS  PREVIOUS   09-10-21: chest x-ray: patch asymmetric inflammatory changes. These could represent areas of atypical infection; focal atelectasis infarction or evolving consolidation among other possibilities.  11-15-21: scrotal ultrasounds No evidence of intratesticular   mass or torsion bilateral  No evidence of varicoceles bilateral  Small sized right hydrocele Bilateral epididymal head is enlarges may represent epididymitis   TODAY  01-23-22: dexa: t score -1.331   LABS REVIEWED PREVIOUS      03-13-21: wbc 7.3; hgb 14.0; hct 42.5; mcv 85.5 plt 212; glucose 126; bun 24; creat 1.03 ;k+ 3.9; na++ 139; ca 9.6; GFR>60 03-15-21: hgb a1c 5.7 03-20-21: wbc 20.3; hgb 14.6; hct 44.4; mcv 86.7 plt 229; glcusoe 201; bun 26; creat 1.44; k+ 4.2; na++ 138; ca 9.2; GFR 46  03-22-21: aldosterone 7.6; cortisol AM 19.5;  03-26-21: wbc 6.3; hgb 12.4; hct 37.9; mcv 87.5 plt 230; glucose 117; bun 28; creat 1.13; k+ 4.4; na++ 138; ca 9.2; GFR>60  04-23-21: PSA 0.6; testosterone: <3 08-10-21: PSA 0.17 testosterone <3 08-16-21: wbc 6.0; hgb 12.0; hct 37.1; mcv 89.0 plt 181; glucose 98; bun 40; creat 1.46; k+ 5.7; na++ 138; ca 9.3 GFR>46 protein 5.8; albumin 3.4; tsh 0.620 08-24-21: glucose 104; bun 46; creat 2.07; k+ 4.7; na++ 141; ca 8.5; GFR 30  08-30-21: vitamin B12  205  09-10-21: wbc 8.5; hgb 12.8; hct 38.5; mcv 87.3; plt 167; glucose 131; bun 25; creat 1.20; k+ 3.9; na++ 140; ca 9.6; GFR 58; iron 38; tibc 261; folate 14.2; CRP 6.7; d-dimer 0.43 09-17-21: CRP <0.5 10-18-21: wbc 6.1; hgb 13.3; hct 41.5; mcv 89.7 plt 190; glucose 91; bun 25; creat 0.95; k+ 3.4; na++ 142; ca  9.3; gfr >60; protein 6.0; albumin 3.5  TODAY  01-31-22: glucose 100; bun 20; creat 0.94; k+ 3.5; na++ 143; ca 9.5 gfr >60 02-11-22: glucose 102; bun 19; creat 0.91; k+ 3.3; na++ 141; ca 9.3; gfr >60   Review of Systems  Constitutional:  Negative for malaise/fatigue.  Respiratory:  Negative for cough  and shortness of breath.   Cardiovascular:  Negative for chest pain, palpitations and leg swelling.  Gastrointestinal:  Negative for abdominal pain, constipation and heartburn.  Musculoskeletal:  Negative for back pain, joint pain and myalgias.  Skin: Negative.   Neurological:  Negative for dizziness.  Psychiatric/Behavioral:  The patient is not nervous/anxious.    Physical Exam Constitutional:      General: He is not in acute distress.    Appearance: He is well-developed. He is not diaphoretic.  Neck:     Thyroid: No thyromegaly.  Cardiovascular:     Rate and Rhythm: Normal rate and regular rhythm.     Pulses: Normal pulses.     Heart sounds: Normal heart sounds.  Pulmonary:     Effort: Pulmonary effort is normal. No respiratory distress.     Breath sounds: Normal breath sounds.  Abdominal:     General: Bowel sounds are normal. There is no distension.     Palpations: Abdomen is soft.     Tenderness: There is no abdominal tenderness.  Musculoskeletal:        General: Normal range of motion.     Cervical back: Neck supple.     Right lower leg: No edema.     Left lower leg: No edema.  Lymphadenopathy:     Cervical: No cervical adenopathy.  Skin:    General: Skin is warm and dry.  Neurological:     Mental Status: He is alert. Mental status is at baseline.  Psychiatric:        Mood and Affect: Mood normal.     ASSESSMENT/ PLAN:  TODAY  Left hip pain: will continue mobic 7.5 mg daily robaxin 500 mg daily   2. Dementia without behavioral disturbance unspecified dementia type: weight is 177 pounds will continue namenda 10 mg twice daily   3. Aortic atherosclerosis (ct 06-09-20) is on asa 81 mg daily   4. Protein calorie malnutrition: albumin 3.5 will monitor    PREVIOUS   5. Benign hypertension with chronic kidney disease stage III stable b/p 141/79 will continue lopressor 25 mg twice daily cardizem cd 300 mg daily lisinopril 40 mg daily   6. Gastroesophageal reflux  disease without esophagitis: will continue prilosec 20 mg daily   7. Prostate caner/over active bladder; will continue urostral 10 mg daily; gemtesa 75 mg daily as needed: no oxybutynin due to history of fracture; off myebretriq due to hypertension  8. Hypokalemia k+ 3.3 will continue k+ 20 meq twice daily   9. Dyslipidemia: is stable is off zocor will continue fish oil 1 gm daily   10. Chronic constipation: will continue miralax three times weekly; and and senna s as needed  11. CKD stage 3b; is stable bun 19; creat 0.91; gfr>60  12. Major depression recurrent chronic: will continue effexor xr 150 mg daily melatonin 10 mg nightly and seroquel 25 mg twice daily   13. RLS: will continue requip 0.25 mg nightly   14. Iron deficiency anemia due to dietary intake: hgb 13.3; is off iron  15. Interstitial lung disease: claritin 10 mg daily   16. Vitamin B 12 deficiency: level 205 will continue 1,000  mcg daily  17. Osteopenia: t score -1.331 is on calcium and vitamin d supplements      Ok Edwards NP [facility]   call [phone]

## 2022-02-18 ENCOUNTER — Other Ambulatory Visit (HOSPITAL_COMMUNITY)
Admission: RE | Admit: 2022-02-18 | Discharge: 2022-02-18 | Disposition: A | Discharge status: Home / Self Care | Payer: Medicare Other | Source: Skilled Nursing Facility | Attending: Adult Health | Admitting: Adult Health

## 2022-02-18 DIAGNOSIS — I129 Hypertensive chronic kidney disease with stage 1 through stage 4 chronic kidney disease, or unspecified chronic kidney disease: Secondary | ICD-10-CM | POA: Insufficient documentation

## 2022-02-18 DIAGNOSIS — N189 Chronic kidney disease, unspecified: Secondary | ICD-10-CM | POA: Diagnosis not present

## 2022-02-18 LAB — BASIC METABOLIC PANEL
Anion gap: 6 (ref 5–15)
BUN: 27 mg/dL — ABNORMAL HIGH (ref 8–23)
CO2: 28 mmol/L (ref 22–32)
Calcium: 9.3 mg/dL (ref 8.9–10.3)
Chloride: 107 mmol/L (ref 98–111)
Creatinine, Ser: 1.1 mg/dL (ref 0.61–1.24)
GFR, Estimated: >60 mL/min (ref >60)
Glucose, Bld: 114 mg/dL — ABNORMAL HIGH (ref 70–99)
Potassium: 3.8 mmol/L (ref 3.5–5.1)
Sodium: 141 mmol/L (ref 135–145)

## 2022-02-20 DIAGNOSIS — F331 Major depressive disorder, recurrent, moderate: Secondary | ICD-10-CM | POA: Diagnosis not present

## 2022-02-25 ENCOUNTER — Other Ambulatory Visit (HOSPITAL_COMMUNITY)
Admission: RE | Admit: 2022-02-25 | Discharge: 2022-02-25 | Disposition: A | Discharge status: Home / Self Care | Payer: Medicare Other | Source: Skilled Nursing Facility | Attending: Adult Health | Admitting: Adult Health

## 2022-02-25 DIAGNOSIS — N289 Disorder of kidney and ureter, unspecified: Secondary | ICD-10-CM | POA: Insufficient documentation

## 2022-02-25 LAB — CREATININE, SERUM
Creatinine, Ser: 1 mg/dL (ref 0.61–1.24)
GFR, Estimated: >60 mL/min (ref >60)

## 2022-02-25 LAB — BUN: BUN: 21 mg/dL (ref 8–23)

## 2022-02-27 DIAGNOSIS — F331 Major depressive disorder, recurrent, moderate: Secondary | ICD-10-CM | POA: Diagnosis not present

## 2022-02-28 ENCOUNTER — Ambulatory Visit (HOSPITAL_COMMUNITY)
Admission: RE | Admit: 2022-02-28 | Discharge: 2022-02-28 | Disposition: A | Discharge status: Home / Self Care | Payer: Medicare Other | Source: Ambulatory Visit | Attending: General Surgery | Admitting: General Surgery

## 2022-02-28 DIAGNOSIS — N433 Hydrocele, unspecified: Secondary | ICD-10-CM | POA: Diagnosis not present

## 2022-02-28 DIAGNOSIS — R1031 Right lower quadrant pain: Secondary | ICD-10-CM | POA: Insufficient documentation

## 2022-02-28 DIAGNOSIS — K5730 Diverticulosis of large intestine without perforation or abscess without bleeding: Secondary | ICD-10-CM | POA: Diagnosis not present

## 2022-02-28 DIAGNOSIS — K429 Umbilical hernia without obstruction or gangrene: Secondary | ICD-10-CM | POA: Diagnosis not present

## 2022-02-28 DIAGNOSIS — N323 Diverticulum of bladder: Secondary | ICD-10-CM | POA: Diagnosis not present

## 2022-02-28 MED ORDER — IOHEXOL 300 MG/ML  SOLN
75.0000 mL | Freq: Once | INTRAMUSCULAR | Status: AC | PRN
  Administered 2022-02-28: 75 mL via INTRAVENOUS

## 2022-03-03 NOTE — Progress Notes (Signed)
Can you let the patient/ his daughter in law know that he does not have any groin hernias. I am not sure why he was having the pain. Nothing to do surgically.

## 2022-03-05 DIAGNOSIS — F411 Generalized anxiety disorder: Secondary | ICD-10-CM | POA: Diagnosis not present

## 2022-03-05 DIAGNOSIS — F321 Major depressive disorder, single episode, moderate: Secondary | ICD-10-CM | POA: Diagnosis not present

## 2022-03-05 DIAGNOSIS — G3184 Mild cognitive impairment, so stated: Secondary | ICD-10-CM | POA: Diagnosis not present

## 2022-03-06 DIAGNOSIS — F331 Major depressive disorder, recurrent, moderate: Secondary | ICD-10-CM | POA: Diagnosis not present

## 2022-03-12 DIAGNOSIS — F331 Major depressive disorder, recurrent, moderate: Secondary | ICD-10-CM | POA: Diagnosis not present

## 2022-03-12 DIAGNOSIS — F02A3 Dementia in other diseases classified elsewhere, mild, with mood disturbance: Secondary | ICD-10-CM | POA: Diagnosis not present

## 2022-03-12 DIAGNOSIS — F419 Anxiety disorder, unspecified: Secondary | ICD-10-CM | POA: Diagnosis not present

## 2022-03-13 DIAGNOSIS — Z23 Encounter for immunization: Secondary | ICD-10-CM | POA: Diagnosis not present

## 2022-03-13 DIAGNOSIS — F331 Major depressive disorder, recurrent, moderate: Secondary | ICD-10-CM | POA: Diagnosis not present

## 2022-03-18 DIAGNOSIS — F0390 Unspecified dementia without behavioral disturbance: Secondary | ICD-10-CM | POA: Diagnosis not present

## 2022-03-18 DIAGNOSIS — Z1383 Encounter for screening for respiratory disorder NEC: Secondary | ICD-10-CM | POA: Diagnosis not present

## 2022-03-18 DIAGNOSIS — Z1159 Encounter for screening for other viral diseases: Secondary | ICD-10-CM | POA: Diagnosis not present

## 2022-03-19 DIAGNOSIS — R1312 Dysphagia, oropharyngeal phase: Secondary | ICD-10-CM | POA: Diagnosis not present

## 2022-03-19 DIAGNOSIS — F0390 Unspecified dementia without behavioral disturbance: Secondary | ICD-10-CM | POA: Diagnosis not present

## 2022-03-20 DIAGNOSIS — F331 Major depressive disorder, recurrent, moderate: Secondary | ICD-10-CM | POA: Diagnosis not present

## 2022-03-20 DIAGNOSIS — R1312 Dysphagia, oropharyngeal phase: Secondary | ICD-10-CM | POA: Diagnosis not present

## 2022-03-20 DIAGNOSIS — F0390 Unspecified dementia without behavioral disturbance: Secondary | ICD-10-CM | POA: Diagnosis not present

## 2022-03-22 DIAGNOSIS — F0390 Unspecified dementia without behavioral disturbance: Secondary | ICD-10-CM | POA: Diagnosis not present

## 2022-03-22 DIAGNOSIS — R1312 Dysphagia, oropharyngeal phase: Secondary | ICD-10-CM | POA: Diagnosis not present

## 2022-03-24 DIAGNOSIS — R1312 Dysphagia, oropharyngeal phase: Secondary | ICD-10-CM | POA: Diagnosis not present

## 2022-03-24 DIAGNOSIS — F0390 Unspecified dementia without behavioral disturbance: Secondary | ICD-10-CM | POA: Diagnosis not present

## 2022-03-26 DIAGNOSIS — F0390 Unspecified dementia without behavioral disturbance: Secondary | ICD-10-CM | POA: Diagnosis not present

## 2022-03-26 DIAGNOSIS — R1312 Dysphagia, oropharyngeal phase: Secondary | ICD-10-CM | POA: Diagnosis not present

## 2022-03-27 DIAGNOSIS — F0390 Unspecified dementia without behavioral disturbance: Secondary | ICD-10-CM | POA: Diagnosis not present

## 2022-03-27 DIAGNOSIS — F331 Major depressive disorder, recurrent, moderate: Secondary | ICD-10-CM | POA: Diagnosis not present

## 2022-03-27 DIAGNOSIS — R1312 Dysphagia, oropharyngeal phase: Secondary | ICD-10-CM | POA: Diagnosis not present

## 2022-03-28 ENCOUNTER — Encounter: Payer: Self-pay | Admitting: Adult Health

## 2022-03-28 ENCOUNTER — Non-Acute Institutional Stay (SKILLED_NURSING_FACILITY): Payer: Medicare Other | Admitting: Adult Health

## 2022-03-28 DIAGNOSIS — R443 Hallucinations, unspecified: Secondary | ICD-10-CM | POA: Diagnosis not present

## 2022-03-28 DIAGNOSIS — F0390 Unspecified dementia without behavioral disturbance: Secondary | ICD-10-CM | POA: Diagnosis not present

## 2022-03-28 DIAGNOSIS — R1312 Dysphagia, oropharyngeal phase: Secondary | ICD-10-CM | POA: Diagnosis not present

## 2022-03-28 NOTE — Progress Notes (Signed)
Location:  [place] Room Number: 156 Place of Service:  SNF (31) Provider:  Ok Edwards, NP  CODE STATUS: DNR  Allergies  Allergen Reactions   Hctz [Hydrochlorothiazide]     hypokalemia   Xanax [Alprazolam] Other (See Comments)    Causes hallucinations     Chief Complaint  Patient presents with   Acute Visit    Hallucinations    HPI:  He is having worsening hallucinations. He is intermittently seeing people; bugs; and other animals. There are no reports of agitation. He has had one fall without injury. He is being seen by ST for dysphagia.   Past Medical History:  Diagnosis Date   Anxiety    Cancer [facility])    prostate   Chronic chest wall pain    Essential hypertension, benign 06/20/2016   GAD (generalized anxiety disorder)    GERD (gastroesophageal reflux disease)    H/O echocardiogram 2016   normal   Heart murmur    "leaking heart valve"   High cholesterol 06/20/2016   HOH (hard of hearing)    left   Hx of falling    Interstitial pulmonary disease (HCC)    Lower GI bleed    MDD (major depressive disorder)    Murmur, cardiac    Poor historian    PUD (peptic ulcer disease)    RLS (restless legs syndrome)    Shortness of breath    Wrist fracture 12/16/2011    Past Surgical History:  Procedure Laterality Date   CHOLECYSTECTOMY N/A 06/28/2020   Procedure: LAPAROSCOPIC CHOLECYSTECTOMY;  Surgeon: [name], MD;  Location: [facility] ORS;  Service: General;  Laterality: N/A;  pt in [place]  2005   Rehman: Sigmoid colon diverticulosis, moderate sized external hemorrhoids. A few focal areas of mucosal erythema felt to be nonspecific.   ESOPHAGOGASTRODUODENOSCOPY     ESOPHAGOGASTRODUODENOSCOPY N/A 07/18/2016   Rehman: normal exam except duodenal scar. h.pylori serologies negative   FLEXIBLE SIGMOIDOSCOPY N/A 06/11/2020   Procedure: FLEXIBLE SIGMOIDOSCOPY;  Surgeon: Eloise Harman, DO;  Location: [facility];  Service:  Endoscopy;  Laterality: N/A;   HARDWARE REMOVAL Right 03/20/2021   Procedure: HARDWARE REMOVAL right hip;  Surgeon: Carole Civil, MD;  Location: [facility] ORS;  Service: Orthopedics;  Laterality: Right;   HIP PINNING,CANNULATED Right 03/10/2020   Procedure: INTERNAL FIXATION RIGHT HIP;  Surgeon: Carole Civil, MD;  Location: [facility];  Service: Orthopedics;  Laterality: Right;   ORIF WRIST FRACTURE  12/19/2011   Procedure: OPEN REDUCTION INTERNAL FIXATION (ORIF) WRIST FRACTURE;  Surgeon: Carole Civil, MD;  Location: [facility];  Service: Orthopedics;  Laterality: Right;   prostate cancer     Diagnosed in the 90s   TONSILLECTOMY      Social History   Socioeconomic History   Marital status: Widowed    Spouse name: Not on file   Number of children: Not on file   Years of education: Not on file   Highest education level: Not on file  Occupational History   Occupation: retired    Fish farm manager: RETIRED  Tobacco Use   Smoking status: Never   Smokeless tobacco: Never  Vaping Use   Vaping Use: Never used  Substance and Sexual Activity   Alcohol use: No   Drug use: No   Sexual activity: Not Currently  Other Topics Concern   Not on file  Social History Narrative   Is long term resident of [facility]  Social Determinants of Health   Financial Resource Strain: Not on file  Food Insecurity: Not on file  Transportation Needs: Not on file  Physical Activity: Not on file  Stress: Not on file  Social Connections: Not on file  Intimate Partner Violence: Not on file   Family History  Problem Relation Age of Onset   Cancer Other    Heart attack Mother    Cancer Brother    Heart attack Brother    Heart disease Sister        by pass      VITAL SIGNS BP (!) 145/79   Pulse 61   Temp 98.5 F (36.9 C)   Resp 20   Ht 5' 9"  (1.753 m)   Wt 175 lb 9.6 oz (79.7 kg)   SpO2 97%   BMI 25.93 kg/m   Outpatient Encounter Medications as of 03/28/2022  Medication Sig   acetaminophen  (TYLENOL) 500 MG tablet Take 500 mg by mouth every 6 (six) hours. Max 3070m in 24 hour period   alfuzosin (UROXATRAL) 10 MG 24 hr tablet Take 10 mg by mouth daily. 6 pm   Ascorbic Acid (VITAMIN C) 1000 MG tablet Take 1,000 mg by mouth daily.   Balsam [place]-Castor Oil (VENELEX) OINT Apply 1 application topically every 8 (eight) hours as needed (Apply to bilateral buttocks & sacral area qshift for blanchable erythema.).   calcium carbonate (TUMS - DOSED IN MG ELEMENTAL CALCIUM) 500 MG chewable tablet Chew 1 tablet by mouth daily.   Cholecalciferol (D3-1000) 25 MCG (1000 UT) tablet Take 1,000 Units by mouth daily.   dilTIAZem HCl ER 300 MG TB24 Take 1 capsule by mouth daily.   Flax Oil-Fish Oil-Borage Oil (FISH OIL-FLAX OIL-BORAGE OIL) CAPS Take 1 capsule by mouth daily.   hydrALAZINE (APRESOLINE) 10 MG tablet Take 10 mg by mouth 3 (three) times daily. Hypertensive chronic kidney disease with stage 1 through stage 4 chronic kidney disease, or unspecified chronic kidney disease   isosorbide mononitrate (IMDUR) 60 MG 24 hr tablet Take 60 mg by mouth daily.   lisinopril (ZESTRIL) 40 MG tablet Take 40 mg by mouth daily. Hypertensive chronic kidney disease with stage 1 through stage 4 chronic kidney disease, or unspecified chronic kidney disease   loratadine (CLARITIN) 10 MG tablet Take 10 mg by mouth daily.   Melatonin 10 MG TABS Take 10 mg by mouth at bedtime.   meloxicam (MOBIC) 7.5 MG tablet Take 7.5 mg by mouth daily. give for left hip pain   memantine (NAMENDA) 10 MG tablet Take 10 mg by mouth 2 (two) times daily.   methocarbamol (ROBAXIN) 500 MG tablet Take 500 mg by mouth daily.   metoprolol tartrate (LOPRESSOR) 25 MG tablet Take 1 tablet (25 mg total) by mouth 2 (two) times daily.   omeprazole (PRILOSEC) 20 MG capsule Take 1 capsule (20 mg total) by mouth daily before breakfast.   OXYGEN Inhale 2 L into the lungs as needed (To keep O2 ststa > 90 %).   polyethylene glycol (MIRALAX / GLYCOLAX) 17 g  packet Take 17 g by mouth daily. On Tuesday, Thursday, and Saturday. At Bedtime   polyethylene glycol (MIRALAX / GLYCOLAX) 17 g packet Take 17 g by mouth at bedtime.   potassium chloride SA (KLOR-CON) 20 MEQ tablet Take 20 mEq by mouth 2 (two) times daily.   QUEtiapine (SEROQUEL) 25 MG tablet Take 25 mg by mouth 2 (two) times daily.   rOPINIRole (REQUIP) 0.25 MG tablet Take 0.25 mg  by mouth at bedtime.   saline (AYR) GEL Place 1 Application into both nostrils every 6 (six) hours as needed.   UNABLE TO FIND Diet:Regular   venlafaxine XR (EFFEXOR-XR) 150 MG 24 hr capsule Take 150 mg by mouth daily with breakfast.   Vibegron (GEMTESA) 75 MG TABS Take 1 tablet by mouth daily as needed.   vitamin B-12 (CYANOCOBALAMIN) 1000 MCG tablet Take 1,000 mcg by mouth daily. for vitamin B 12 level 205   [DISCONTINUED] Calcium Carbonate Antacid 600 MG chewable tablet Chew 1,500 mg by mouth daily.   [DISCONTINUED] loperamide (IMODIUM A-D) 2 MG tablet Take 2 mg by mouth as needed (after each stool. (do not exceed more than 4 tabs in 24 hours)).   [DISCONTINUED] ondansetron (ZOFRAN) 8 MG tablet Take 8 mg by mouth as needed for nausea.   [DISCONTINUED] senna-docusate (SENOKOT-S) 8.6-50 MG tablet Take 1 tablet by mouth at bedtime as needed for mild constipation.   [DISCONTINUED] simethicone (MYLICON) 629 MG chewable tablet Chew 125 mg by mouth 3 (three) times daily as needed ("increased gas").   No facility-administered encounter medications on file as of 03/28/2022.     SIGNIFICANT DIAGNOSTIC EXAMS  PREVIOUS   09-10-21: chest x-ray: patch asymmetric inflammatory changes. These could represent areas of atypical infection; focal atelectasis infarction or evolving consolidation among other possibilities.  11-15-21: scrotal ultrasounds No evidence of intratesticular   mass or torsion bilateral  No evidence of varicoceles bilateral  Small sized right hydrocele Bilateral epididymal head is enlarges may represent  epididymitis   01-23-22: dexa: t score -1.331  NO NEW LABS.    LABS REVIEWED PREVIOUS      03-13-21: wbc 7.3; hgb 14.0; hct 42.5; mcv 85.5 plt 212; glucose 126; bun 24; creat 1.03 ;k+ 3.9; na++ 139; ca 9.6; GFR>60 03-15-21: hgb a1c 5.7 03-20-21: wbc 20.3; hgb 14.6; hct 44.4; mcv 86.7 plt 229; glcusoe 201; bun 26; creat 1.44; k+ 4.2; na++ 138; ca 9.2; GFR 46  03-22-21: aldosterone 7.6; cortisol AM 19.5;  03-26-21: wbc 6.3; hgb 12.4; hct 37.9; mcv 87.5 plt 230; glucose 117; bun 28; creat 1.13; k+ 4.4; na++ 138; ca 9.2; GFR>60  04-23-21: PSA 0.6; testosterone: <3 08-10-21: PSA 0.17 testosterone <3 08-16-21: wbc 6.0; hgb 12.0; hct 37.1; mcv 89.0 plt 181; glucose 98; bun 40; creat 1.46; k+ 5.7; na++ 138; ca 9.3 GFR>46 protein 5.8; albumin 3.4; tsh 0.620 08-24-21: glucose 104; bun 46; creat 2.07; k+ 4.7; na++ 141; ca 8.5; GFR 30  08-30-21: vitamin B12  205  09-10-21: wbc 8.5; hgb 12.8; hct 38.5; mcv 87.3; plt 167; glucose 131; bun 25; creat 1.20; k+ 3.9; na++ 140; ca 9.6; GFR 58; iron 38; tibc 261; folate 14.2; CRP 6.7; d-dimer 0.43 09-17-21: CRP <0.5 10-18-21: wbc 6.1; hgb 13.3; hct 41.5; mcv 89.7 plt 190; glucose 91; bun 25; creat 0.95; k+ 3.4; na++ 142; ca 9.3; gfr >60; protein 6.0; albumin 3.5 01-31-22: glucose 100; bun 20; creat 0.94; k+ 3.5; na++ 143; ca 9.5 gfr >60 02-11-22: glucose 102; bun 19; creat 0.91; k+ 3.3; na++ 141; ca 9.3; gfr >60   NO NEW LABS.   Review of Systems  Constitutional:  Negative for malaise/fatigue.  Respiratory:  Negative for cough and shortness of breath.   Cardiovascular:  Negative for chest pain, palpitations and leg swelling.  Gastrointestinal:  Negative for abdominal pain, constipation and heartburn.  Musculoskeletal:  Negative for back pain, joint pain and myalgias.  Skin: Negative.   Neurological:  Negative for dizziness.  Psychiatric/Behavioral:  Positive for depression and hallucinations. Negative for suicidal ideas. The patient is not nervous/anxious.      Physical Exam Constitutional:      General: He is not in acute distress.    Appearance: He is well-developed. He is not diaphoretic.  Neck:     Thyroid: No thyromegaly.  Cardiovascular:     Rate and Rhythm: Normal rate and regular rhythm.     Pulses: Normal pulses.     Heart sounds: Normal heart sounds.  Pulmonary:     Effort: Pulmonary effort is normal. No respiratory distress.     Breath sounds: Normal breath sounds.  Abdominal:     General: Bowel sounds are normal. There is no distension.     Palpations: Abdomen is soft.     Tenderness: There is no abdominal tenderness.  Musculoskeletal:        General: Normal range of motion.     Cervical back: Neck supple.     Right lower leg: No edema.     Left lower leg: No edema.  Lymphadenopathy:     Cervical: No cervical adenopathy.  Skin:    General: Skin is warm and dry.  Neurological:     Mental Status: He is alert. Mental status is at baseline.  Psychiatric:        Mood and Affect: Mood normal.       ASSESSMENT/ PLAN:  TODAY  Hallucination: is worse; will increase seroquel to 50 mg twice daily and will monitor his response    Ok Edwards NP [facility]  call [phone]

## 2022-03-29 DIAGNOSIS — F0390 Unspecified dementia without behavioral disturbance: Secondary | ICD-10-CM | POA: Diagnosis not present

## 2022-03-29 DIAGNOSIS — R1312 Dysphagia, oropharyngeal phase: Secondary | ICD-10-CM | POA: Diagnosis not present

## 2022-04-01 ENCOUNTER — Non-Acute Institutional Stay (SKILLED_NURSING_FACILITY): Payer: Medicare Other | Admitting: Adult Health

## 2022-04-01 ENCOUNTER — Ambulatory Visit (HOSPITAL_COMMUNITY)
Admission: RE | Admit: 2022-04-01 | Discharge: 2022-04-01 | Disposition: A | Discharge status: Home / Self Care | Payer: Medicare Other | Source: Skilled Nursing Facility | Attending: Adult Health | Admitting: Adult Health

## 2022-04-01 ENCOUNTER — Other Ambulatory Visit (HOSPITAL_COMMUNITY)
Admission: RE | Admit: 2022-04-01 | Discharge: 2022-04-01 | Disposition: A | Discharge status: Home / Self Care | Payer: Medicare Other | Source: Skilled Nursing Facility | Attending: Adult Health | Admitting: Adult Health

## 2022-04-01 ENCOUNTER — Encounter: Payer: Self-pay | Admitting: Adult Health

## 2022-04-01 DIAGNOSIS — F01518 Vascular dementia, unspecified severity, with other behavioral disturbance: Secondary | ICD-10-CM | POA: Diagnosis not present

## 2022-04-01 DIAGNOSIS — R4182 Altered mental status, unspecified: Secondary | ICD-10-CM | POA: Diagnosis not present

## 2022-04-01 DIAGNOSIS — R41841 Cognitive communication deficit: Secondary | ICD-10-CM | POA: Diagnosis not present

## 2022-04-01 DIAGNOSIS — Z043 Encounter for examination and observation following other accident: Secondary | ICD-10-CM | POA: Diagnosis not present

## 2022-04-01 DIAGNOSIS — E069 Thyroiditis, unspecified: Secondary | ICD-10-CM | POA: Diagnosis not present

## 2022-04-01 LAB — COMPREHENSIVE METABOLIC PANEL
ALT: 14 U/L (ref 0–44)
AST: 12 U/L — ABNORMAL LOW (ref 15–41)
Albumin: 3.5 g/dL (ref 3.5–5.0)
Alkaline Phosphatase: 75 U/L (ref 38–126)
Anion gap: 7 (ref 5–15)
BUN: 37 mg/dL — ABNORMAL HIGH (ref 8–23)
CO2: 25 mmol/L (ref 22–32)
Calcium: 9.7 mg/dL (ref 8.9–10.3)
Chloride: 112 mmol/L — ABNORMAL HIGH (ref 98–111)
Creatinine, Ser: 1.07 mg/dL (ref 0.61–1.24)
GFR, Estimated: >60 mL/min (ref >60)
Glucose, Bld: 115 mg/dL — ABNORMAL HIGH (ref 70–99)
Potassium: 4.1 mmol/L (ref 3.5–5.1)
Sodium: 144 mmol/L (ref 135–145)
Total Bilirubin: 0.3 mg/dL (ref 0.3–1.2)
Total Protein: 6 g/dL — ABNORMAL LOW (ref 6.5–8.1)

## 2022-04-01 LAB — CBC
HCT: 41 % (ref 39.0–52.0)
Hemoglobin: 13.3 g/dL (ref 13.0–17.0)
MCH: 28.2 pg (ref 26.0–34.0)
MCHC: 32.4 g/dL (ref 30.0–36.0)
MCV: 86.9 fL (ref 80.0–100.0)
Platelets: 174 10*3/uL (ref 150–400)
RBC: 4.72 MIL/uL (ref 4.22–5.81)
RDW: 14.3 % (ref 11.5–15.5)
WBC: 6.9 10*3/uL (ref 4.0–10.5)
nRBC: 0 % (ref 0.0–0.2)

## 2022-04-01 LAB — TSH: TSH: 0.157 u[IU]/mL — ABNORMAL LOW (ref 0.350–4.500)

## 2022-04-01 LAB — VITAMIN B12: Vitamin B-12: 856 pg/mL (ref 180–914)

## 2022-04-01 NOTE — Progress Notes (Unsigned)
Location:  [place] Room Number: 156 Place of Service:  SNF (31)   CODE STATUS: dnr  Allergies  Allergen Reactions   Hctz [Hydrochlorothiazide]     hypokalemia   Xanax [Alprazolam] Other (See Comments)    Causes hallucinations     Chief Complaint  Patient presents with   Acute Visit    Change in status     HPI:  He has had 2 falls over the past weekend with a small hematoma on the occipital area. He was wandering and required a wonder guard placed. He is oriented to self with a SLUMS of 1/30. He has been having increased confusion over the past couple of weeks.   Past Medical History:  Diagnosis Date   Anxiety    Cancer [facility])    prostate   Chronic chest wall pain    Essential hypertension, benign 06/20/2016   GAD (generalized anxiety disorder)    GERD (gastroesophageal reflux disease)    H/O echocardiogram 2016   normal   Heart murmur    "leaking heart valve"   High cholesterol 06/20/2016   HOH (hard of hearing)    left   Hx of falling    Interstitial pulmonary disease (HCC)    Lower GI bleed    MDD (major depressive disorder)    Murmur, cardiac    Poor historian    PUD (peptic ulcer disease)    RLS (restless legs syndrome)    Shortness of breath    Wrist fracture 12/16/2011    Past Surgical History:  Procedure Laterality Date   CHOLECYSTECTOMY N/A 06/28/2020   Procedure: LAPAROSCOPIC CHOLECYSTECTOMY;  Surgeon: [name], MD;  Location: [facility] ORS;  Service: General;  Laterality: N/A;  pt in [place]  2005   Rehman: Sigmoid colon diverticulosis, moderate sized external hemorrhoids. A few focal areas of mucosal erythema felt to be nonspecific.   ESOPHAGOGASTRODUODENOSCOPY     ESOPHAGOGASTRODUODENOSCOPY N/A 07/18/2016   Rehman: normal exam except duodenal scar. h.pylori serologies negative   FLEXIBLE SIGMOIDOSCOPY N/A 06/11/2020   Procedure: FLEXIBLE SIGMOIDOSCOPY;  Surgeon: Eloise Harman, DO;  Location: [facility]  ENDO SUITE;  Service: Endoscopy;  Laterality: N/A;   HARDWARE REMOVAL Right 03/20/2021   Procedure: HARDWARE REMOVAL right hip;  Surgeon: Carole Civil, MD;  Location: [facility] ORS;  Service: Orthopedics;  Laterality: Right;   HIP PINNING,CANNULATED Right 03/10/2020   Procedure: INTERNAL FIXATION RIGHT HIP;  Surgeon: Carole Civil, MD;  Location: [facility] ORS;  Service: Orthopedics;  Laterality: Right;   ORIF WRIST FRACTURE  12/19/2011   Procedure: OPEN REDUCTION INTERNAL FIXATION (ORIF) WRIST FRACTURE;  Surgeon: Carole Civil, MD;  Location: [facility] ORS;  Service: Orthopedics;  Laterality: Right;   prostate cancer     Diagnosed in the 90s   TONSILLECTOMY      Social History   Socioeconomic History   Marital status: Widowed    Spouse name: Not on file   Number of children: Not on file   Years of education: Not on file   Highest education level: Not on file  Occupational History   Occupation: retired    Fish farm manager: RETIRED  Tobacco Use   Smoking status: Never   Smokeless tobacco: Never  Vaping Use   Vaping Use: Never used  Substance and Sexual Activity   Alcohol use: No   Drug use: No   Sexual activity: Not Currently  Other Topics Concern   Not on file  Social  History Narrative   Is long term resident of [facility]    Social Determinants of Health   Financial Resource Strain: Not on file  Food Insecurity: Not on file  Transportation Needs: Not on file  Physical Activity: Not on file  Stress: Not on file  Social Connections: Not on file  Intimate Partner Violence: Not on file   Family History  Problem Relation Age of Onset   Cancer Other    Heart attack Mother    Cancer Brother    Heart attack Brother    Heart disease Sister        by pass      VITAL SIGNS BP 138/61   Pulse 70   Temp (!) 97.4 F (36.3 C)   Resp 20   Ht 5' 9"  (1.753 m)   Wt 175 lb 9.6 oz (79.7 kg)   BMI 25.93 kg/m   Outpatient Encounter Medications as of 04/01/2022  Medication Sig    acetaminophen (TYLENOL) 500 MG tablet Take 500 mg by mouth every 6 (six) hours. Max 3044m in 24 hour period   alfuzosin (UROXATRAL) 10 MG 24 hr tablet Take 10 mg by mouth daily. 6 pm   Ascorbic Acid (VITAMIN C) 1000 MG tablet Take 1,000 mg by mouth daily.   Balsam [place]-Castor Oil (VENELEX) OINT Apply 1 application topically every 8 (eight) hours as needed (Apply to bilateral buttocks & sacral area qshift for blanchable erythema.).   calcium carbonate (TUMS - DOSED IN MG ELEMENTAL CALCIUM) 500 MG chewable tablet Chew 1 tablet by mouth daily.   Cholecalciferol (D3-1000) 25 MCG (1000 UT) tablet Take 1,000 Units by mouth daily.   dilTIAZem HCl ER 300 MG TB24 Take 1 capsule by mouth daily.   Flax Oil-Fish Oil-Borage Oil (FISH OIL-FLAX OIL-BORAGE OIL) CAPS Take 1 capsule by mouth daily.   hydrALAZINE (APRESOLINE) 10 MG tablet Take 10 mg by mouth 3 (three) times daily. Hypertensive chronic kidney disease with stage 1 through stage 4 chronic kidney disease, or unspecified chronic kidney disease   isosorbide mononitrate (IMDUR) 60 MG 24 hr tablet Take 60 mg by mouth daily.   lisinopril (ZESTRIL) 40 MG tablet Take 40 mg by mouth daily. Hypertensive chronic kidney disease with stage 1 through stage 4 chronic kidney disease, or unspecified chronic kidney disease   loratadine (CLARITIN) 10 MG tablet Take 10 mg by mouth daily.   Melatonin 10 MG TABS Take 10 mg by mouth at bedtime.   meloxicam (MOBIC) 7.5 MG tablet Take 7.5 mg by mouth daily. give for left hip pain   memantine (NAMENDA) 10 MG tablet Take 10 mg by mouth 2 (two) times daily.   methocarbamol (ROBAXIN) 500 MG tablet Take 500 mg by mouth daily.   metoprolol tartrate (LOPRESSOR) 25 MG tablet Take 1 tablet (25 mg total) by mouth 2 (two) times daily.   omeprazole (PRILOSEC) 20 MG capsule Take 1 capsule (20 mg total) by mouth daily before breakfast.   OXYGEN Inhale 2 L into the lungs as needed (To keep O2 ststa > 90 %).   polyethylene glycol (MIRALAX /  GLYCOLAX) 17 g packet Take 17 g by mouth daily. On Tuesday, Thursday, and Saturday. At Bedtime   polyethylene glycol (MIRALAX / GLYCOLAX) 17 g packet Take 17 g by mouth at bedtime.   potassium chloride SA (KLOR-CON) 20 MEQ tablet Take 20 mEq by mouth 2 (two) times daily.   QUEtiapine (SEROQUEL) 25 MG tablet Take 25 mg by mouth 2 (two) times daily.  rOPINIRole (REQUIP) 0.25 MG tablet Take 0.25 mg by mouth at bedtime.   saline (AYR) GEL Place 1 Application into both nostrils every 6 (six) hours as needed.   UNABLE TO FIND Diet:Regular   venlafaxine XR (EFFEXOR-XR) 150 MG 24 hr capsule Take 150 mg by mouth daily with breakfast.   Vibegron (GEMTESA) 75 MG TABS Take 1 tablet by mouth daily as needed.   vitamin B-12 (CYANOCOBALAMIN) 1000 MCG tablet Take 1,000 mcg by mouth daily. for vitamin B 12 level 205   No facility-administered encounter medications on file as of 04/01/2022.     SIGNIFICANT DIAGNOSTIC EXAMS  PREVIOUS   09-10-21: chest x-ray: patch asymmetric inflammatory changes. These could represent areas of atypical infection; focal atelectasis infarction or evolving consolidation among other possibilities.  11-15-21: scrotal ultrasounds No evidence of intratesticular   mass or torsion bilateral  No evidence of varicoceles bilateral  Small sized right hydrocele Bilateral epididymal head is enlarges may represent epididymitis   01-23-22: dexa: t score -1.331  NO NEW LABS.    LABS REVIEWED PREVIOUS      03-13-21: wbc 7.3; hgb 14.0; hct 42.5; mcv 85.5 plt 212; glucose 126; bun 24; creat 1.03 ;k+ 3.9; na++ 139; ca 9.6; GFR>60 03-15-21: hgb a1c 5.7 03-20-21: wbc 20.3; hgb 14.6; hct 44.4; mcv 86.7 plt 229; glcusoe 201; bun 26; creat 1.44; k+ 4.2; na++ 138; ca 9.2; GFR 46  03-22-21: aldosterone 7.6; cortisol AM 19.5;  03-26-21: wbc 6.3; hgb 12.4; hct 37.9; mcv 87.5 plt 230; glucose 117; bun 28; creat 1.13; k+ 4.4; na++ 138; ca 9.2; GFR>60  04-23-21: PSA 0.6; testosterone: <3 08-10-21: PSA  0.17 testosterone <3 08-16-21: wbc 6.0; hgb 12.0; hct 37.1; mcv 89.0 plt 181; glucose 98; bun 40; creat 1.46; k+ 5.7; na++ 138; ca 9.3 GFR>46 protein 5.8; albumin 3.4; tsh 0.620 08-24-21: glucose 104; bun 46; creat 2.07; k+ 4.7; na++ 141; ca 8.5; GFR 30  08-30-21: vitamin B12  205  09-10-21: wbc 8.5; hgb 12.8; hct 38.5; mcv 87.3; plt 167; glucose 131; bun 25; creat 1.20; k+ 3.9; na++ 140; ca 9.6; GFR 58; iron 38; tibc 261; folate 14.2; CRP 6.7; d-dimer 0.43 09-17-21: CRP <0.5 10-18-21: wbc 6.1; hgb 13.3; hct 41.5; mcv 89.7 plt 190; glucose 91; bun 25; creat 0.95; k+ 3.4; na++ 142; ca 9.3; gfr >60; protein 6.0; albumin 3.5 01-31-22: glucose 100; bun 20; creat 0.94; k+ 3.5; na++ 143; ca 9.5 gfr >60 02-11-22: glucose 102; bun 19; creat 0.91; k+ 3.3; na++ 141; ca 9.3; gfr >60   TODAY  04-01-22: wbc 6.9; hgb 13.3; hct 41.0; mcv 86.9 plt 174; glucose 115; bun 37; creat 1.07; k+ 4.1; na++ 144; ca 9.7; gfr >60; protein 6.0; albumin 3.5; vitamin B12: 856; tsh 0.157   Review of Systems  Reason unable to perform ROS: did not participate.    Physical Exam Constitutional:      General: He is not in acute distress.    Appearance: He is well-developed. He is not diaphoretic.  Neck:     Thyroid: Thyroid mass present. No thyromegaly.  Cardiovascular:     Rate and Rhythm: Normal rate and regular rhythm.     Pulses: Normal pulses.     Heart sounds: Normal heart sounds.  Pulmonary:     Effort: Pulmonary effort is normal. No respiratory distress.     Breath sounds: Normal breath sounds.  Abdominal:     General: Bowel sounds are normal. There is no distension.     Palpations:  Abdomen is soft.     Tenderness: There is no abdominal tenderness.  Musculoskeletal:        General: Normal range of motion.     Cervical back: Neck supple.  Lymphadenopathy:     Cervical: No cervical adenopathy.  Skin:    General: Skin is warm and dry.     Comments: Has a hematoma on occipital region.   Neurological:     Mental  Status: He is alert.  Psychiatric:        Mood and Affect: Mood normal.       ASSESSMENT/ PLAN:  TODAY  Vascular dementia with behavioral disturbance Hyperthyroiditis  Will get ct of head; Will check free t4 and rpr Will stop mobic These behavioral changes since increasing his seroquel will return back to 25 mg twice daily  Will stop mobic Will get ct of thyroid due to nodule  Will monitor his status.   Time spent with patient 50 minutes: change in status; new orders   Ok Edwards NP [facility]   call [phone]

## 2022-04-02 ENCOUNTER — Other Ambulatory Visit (HOSPITAL_COMMUNITY)
Admission: RE | Admit: 2022-04-02 | Discharge: 2022-04-02 | Disposition: A | Discharge status: Home / Self Care | Payer: Medicare Other | Source: Skilled Nursing Facility | Attending: Adult Health | Admitting: Adult Health

## 2022-04-02 ENCOUNTER — Ambulatory Visit (HOSPITAL_COMMUNITY)
Admission: RE | Admit: 2022-04-02 | Discharge: 2022-04-02 | Disposition: A | Discharge status: Home / Self Care | Payer: Medicare Other | Source: Skilled Nursing Facility | Attending: Adult Health | Admitting: Adult Health

## 2022-04-02 DIAGNOSIS — F0390 Unspecified dementia without behavioral disturbance: Secondary | ICD-10-CM | POA: Diagnosis not present

## 2022-04-02 DIAGNOSIS — E041 Nontoxic single thyroid nodule: Secondary | ICD-10-CM | POA: Insufficient documentation

## 2022-04-02 DIAGNOSIS — E0590 Thyrotoxicosis, unspecified without thyrotoxic crisis or storm: Secondary | ICD-10-CM | POA: Insufficient documentation

## 2022-04-02 DIAGNOSIS — R1312 Dysphagia, oropharyngeal phase: Secondary | ICD-10-CM | POA: Diagnosis not present

## 2022-04-02 LAB — T4, FREE: Free T4: 0.8 ng/dL (ref 0.61–1.12)

## 2022-04-02 LAB — RPR: RPR Ser Ql: NONREACTIVE

## 2022-04-03 DIAGNOSIS — Z23 Encounter for immunization: Secondary | ICD-10-CM | POA: Diagnosis not present

## 2022-04-03 DIAGNOSIS — G3184 Mild cognitive impairment, so stated: Secondary | ICD-10-CM | POA: Diagnosis not present

## 2022-04-03 DIAGNOSIS — F411 Generalized anxiety disorder: Secondary | ICD-10-CM | POA: Diagnosis not present

## 2022-04-03 DIAGNOSIS — F331 Major depressive disorder, recurrent, moderate: Secondary | ICD-10-CM | POA: Diagnosis not present

## 2022-04-03 DIAGNOSIS — F321 Major depressive disorder, single episode, moderate: Secondary | ICD-10-CM | POA: Diagnosis not present

## 2022-04-04 DIAGNOSIS — F01518 Vascular dementia, unspecified severity, with other behavioral disturbance: Secondary | ICD-10-CM | POA: Insufficient documentation

## 2022-04-04 DIAGNOSIS — E069 Thyroiditis, unspecified: Secondary | ICD-10-CM

## 2022-04-04 DIAGNOSIS — R1312 Dysphagia, oropharyngeal phase: Secondary | ICD-10-CM | POA: Diagnosis not present

## 2022-04-04 DIAGNOSIS — F0390 Unspecified dementia without behavioral disturbance: Secondary | ICD-10-CM | POA: Diagnosis not present

## 2022-04-04 HISTORY — DX: Thyroiditis, unspecified: E06.9

## 2022-04-05 ENCOUNTER — Encounter: Payer: Self-pay | Admitting: Adult Health

## 2022-04-05 ENCOUNTER — Encounter (HOSPITAL_COMMUNITY)
Admission: RE | Admit: 2022-04-05 | Discharge: 2022-04-05 | Disposition: A | Discharge status: Home / Self Care | Payer: Medicare Other | Source: Skilled Nursing Facility | Attending: Adult Health | Admitting: Adult Health

## 2022-04-05 ENCOUNTER — Non-Acute Institutional Stay (SKILLED_NURSING_FACILITY): Payer: Medicare Other | Admitting: Adult Health

## 2022-04-05 DIAGNOSIS — I509 Heart failure, unspecified: Secondary | ICD-10-CM | POA: Diagnosis not present

## 2022-04-05 DIAGNOSIS — I129 Hypertensive chronic kidney disease with stage 1 through stage 4 chronic kidney disease, or unspecified chronic kidney disease: Secondary | ICD-10-CM | POA: Diagnosis present

## 2022-04-05 DIAGNOSIS — I700 Atherosclerosis of aorta: Secondary | ICD-10-CM

## 2022-04-05 DIAGNOSIS — F01518 Vascular dementia, unspecified severity, with other behavioral disturbance: Secondary | ICD-10-CM

## 2022-04-05 DIAGNOSIS — N1832 Chronic kidney disease, stage 3b: Secondary | ICD-10-CM | POA: Diagnosis not present

## 2022-04-05 DIAGNOSIS — R1312 Dysphagia, oropharyngeal phase: Secondary | ICD-10-CM | POA: Diagnosis not present

## 2022-04-05 DIAGNOSIS — N189 Chronic kidney disease, unspecified: Secondary | ICD-10-CM | POA: Diagnosis not present

## 2022-04-05 DIAGNOSIS — I130 Hypertensive heart and chronic kidney disease with heart failure and stage 1 through stage 4 chronic kidney disease, or unspecified chronic kidney disease: Secondary | ICD-10-CM | POA: Insufficient documentation

## 2022-04-05 DIAGNOSIS — F0390 Unspecified dementia without behavioral disturbance: Secondary | ICD-10-CM | POA: Diagnosis not present

## 2022-04-05 LAB — COMPREHENSIVE METABOLIC PANEL
ALT: 27 U/L (ref 0–44)
AST: 27 U/L (ref 15–41)
Albumin: 3.7 g/dL (ref 3.5–5.0)
Alkaline Phosphatase: 79 U/L (ref 38–126)
Anion gap: 7 (ref 5–15)
BUN: 28 mg/dL — ABNORMAL HIGH (ref 8–23)
CO2: 25 mmol/L (ref 22–32)
Calcium: 9.9 mg/dL (ref 8.9–10.3)
Chloride: 110 mmol/L (ref 98–111)
Creatinine, Ser: 1.14 mg/dL (ref 0.61–1.24)
GFR, Estimated: >60 mL/min (ref >60)
Glucose, Bld: 140 mg/dL — ABNORMAL HIGH (ref 70–99)
Potassium: 4.2 mmol/L (ref 3.5–5.1)
Sodium: 142 mmol/L (ref 135–145)
Total Bilirubin: 0.6 mg/dL (ref 0.3–1.2)
Total Protein: 6.4 g/dL — ABNORMAL LOW (ref 6.5–8.1)

## 2022-04-05 LAB — CBC
HCT: 39.1 % (ref 39.0–52.0)
Hemoglobin: 12.8 g/dL — ABNORMAL LOW (ref 13.0–17.0)
MCH: 28.3 pg (ref 26.0–34.0)
MCHC: 32.7 g/dL (ref 30.0–36.0)
MCV: 86.3 fL (ref 80.0–100.0)
Platelets: 196 10*3/uL (ref 150–400)
RBC: 4.53 MIL/uL (ref 4.22–5.81)
RDW: 14 % (ref 11.5–15.5)
WBC: 6.6 10*3/uL (ref 4.0–10.5)
nRBC: 0 % (ref 0.0–0.2)

## 2022-04-05 LAB — TROPONIN I (HIGH SENSITIVITY): Troponin I (High Sensitivity): 24 ng/L — ABNORMAL HIGH (ref <18)

## 2022-04-05 LAB — LACTIC ACID, PLASMA: Lactic Acid, Venous: 1.4 mmol/L (ref 0.5–1.9)

## 2022-04-05 LAB — BRAIN NATRIURETIC PEPTIDE: B Natriuretic Peptide: 368 pg/mL — ABNORMAL HIGH (ref 0.0–100.0)

## 2022-04-05 NOTE — Progress Notes (Signed)
Location:  [facility]   Place of Service:   SNF    CODE STATUS: dnr   Allergies  Allergen Reactions   Hctz [Hydrochlorothiazide]     hypokalemia   Xanax [Alprazolam] Other (See Comments)    Causes hallucinations     Chief Complaint  Patient presents with   Acute Visit    Care plan meeting.    HPI:  We have come together for his care plan meeting. Family present  BIMS 8/15 mood 4/30: nervous depression. Is ambulatory has has 3 falls one with injury to head. He is independent with his adl care. He is occasionally incontinent of bladder and continent of bowel. Dietary: setup for eating. Weight is 176.1 pounds; diet is regular thin liquids  appetite 50-100%. Therapy: speech therapy for cognition 1/30 SLUMS; and swallowing . Activities: participates at times. He continues to be followed for his chronic illnesses including:  Aortic atherosclerosis Vascular dementia with behavioral disturbance CKD (chronic kidney disease) stage 3b. This AM he had a pre-syncopal episode; ekg sinus rhythm   Past Medical History:  Diagnosis Date   Anxiety    Cancer ([place])    prostate   Chronic chest wall pain    Essential hypertension, benign 06/20/2016   GAD (generalized anxiety disorder)    GERD (gastroesophageal reflux disease)    H/O echocardiogram 2016   normal   Heart murmur    "leaking heart valve"   High cholesterol 06/20/2016   HOH (hard of hearing)    left   Hx of falling    Interstitial pulmonary disease (HCC)    Lower GI bleed    MDD (major depressive disorder)    Murmur, cardiac    Poor historian    PUD (peptic ulcer disease)    RLS (restless legs syndrome)    Shortness of breath    Wrist fracture 12/16/2011    Past Surgical History:  Procedure Laterality Date   CHOLECYSTECTOMY N/A 06/28/2020   Procedure: LAPAROSCOPIC CHOLECYSTECTOMY;  Surgeon: [name], MD;  Location: [facility] ORS;  Service: General;  Laterality: N/A;  pt in [place]  2005    Rehman: Sigmoid colon diverticulosis, moderate sized external hemorrhoids. A few focal areas of mucosal erythema felt to be nonspecific.   ESOPHAGOGASTRODUODENOSCOPY     ESOPHAGOGASTRODUODENOSCOPY N/A 07/18/2016   Rehman: normal exam except duodenal scar. h.pylori serologies negative   FLEXIBLE SIGMOIDOSCOPY N/A 06/11/2020   Procedure: FLEXIBLE SIGMOIDOSCOPY;  Surgeon: Eloise Harman, DO;  Location: [facility] ENDO SUITE;  Service: Endoscopy;  Laterality: N/A;   HARDWARE REMOVAL Right 03/20/2021   Procedure: HARDWARE REMOVAL right hip;  Surgeon: Carole Civil, MD;  Location: [facility] ORS;  Service: Orthopedics;  Laterality: Right;   HIP PINNING,CANNULATED Right 03/10/2020   Procedure: INTERNAL FIXATION RIGHT HIP;  Surgeon: Carole Civil, MD;  Location: [facility] ORS;  Service: Orthopedics;  Laterality: Right;   ORIF WRIST FRACTURE  12/19/2011   Procedure: OPEN REDUCTION INTERNAL FIXATION (ORIF) WRIST FRACTURE;  Surgeon: Carole Civil, MD;  Location: [facility] ORS;  Service: Orthopedics;  Laterality: Right;   prostate cancer     Diagnosed in the 90s   TONSILLECTOMY      Social History   Socioeconomic History   Marital status: Widowed    Spouse name: Not on file   Number of children: Not on file   Years of education: Not on file   Highest education level: Not on file  Occupational History   Occupation:  retired    Fish farm manager: RETIRED  Tobacco Use   Smoking status: Never   Smokeless tobacco: Never  Vaping Use   Vaping Use: Never used  Substance and Sexual Activity   Alcohol use: No   Drug use: No   Sexual activity: Not Currently  Other Topics Concern   Not on file  Social History Narrative   Is long term resident of [facility]    Social Determinants of Health   Financial Resource Strain: Not on file  Food Insecurity: Not on file  Transportation Needs: Not on file  Physical Activity: Not on file  Stress: Not on file  Social Connections: Not on file  Intimate Partner Violence: Not on file    Family History  Problem Relation Age of Onset   Cancer Other    Heart attack Mother    Cancer Brother    Heart attack Brother    Heart disease Sister        by pass      VITAL SIGNS BP 138/61   Pulse 71   Temp (!) 97.4 F (36.3 C)   Resp 20   Ht 5' 9"  (1.753 m)   Wt 175 lb 9.6 oz (79.7 kg)   SpO2 97%   BMI 25.93 kg/m   Outpatient Encounter Medications as of 04/05/2022  Medication Sig   acetaminophen (TYLENOL) 500 MG tablet Take 500 mg by mouth every 6 (six) hours. Max 3012m in 24 hour period   alfuzosin (UROXATRAL) 10 MG 24 hr tablet Take 10 mg by mouth daily. 6 pm   Ascorbic Acid (VITAMIN C) 1000 MG tablet Take 1,000 mg by mouth daily.   Balsam [place]-Castor Oil (VENELEX) OINT Apply 1 application topically every 8 (eight) hours as needed (Apply to bilateral buttocks & sacral area qshift for blanchable erythema.).   calcium carbonate (TUMS - DOSED IN MG ELEMENTAL CALCIUM) 500 MG chewable tablet Chew 1 tablet by mouth daily.   Cholecalciferol (D3-1000) 25 MCG (1000 UT) tablet Take 1,000 Units by mouth daily.   dilTIAZem HCl ER 300 MG TB24 Take 1 capsule by mouth daily.   Flax Oil-Fish Oil-Borage Oil (FISH OIL-FLAX OIL-BORAGE OIL) CAPS Take 1 capsule by mouth daily.   hydrALAZINE (APRESOLINE) 10 MG tablet Take 10 mg by mouth 3 (three) times daily. Hypertensive chronic kidney disease with stage 1 through stage 4 chronic kidney disease, or unspecified chronic kidney disease   isosorbide mononitrate (IMDUR) 60 MG 24 hr tablet Take 60 mg by mouth daily.   lisinopril (ZESTRIL) 40 MG tablet Take 40 mg by mouth daily. Hypertensive chronic kidney disease with stage 1 through stage 4 chronic kidney disease, or unspecified chronic kidney disease   loratadine (CLARITIN) 10 MG tablet Take 10 mg by mouth daily.   Melatonin 10 MG TABS Take 10 mg by mouth at bedtime.   meloxicam (MOBIC) 7.5 MG tablet Take 7.5 mg by mouth daily. give for left hip pain   memantine (NAMENDA) 10 MG tablet Take  10 mg by mouth 2 (two) times daily.   methocarbamol (ROBAXIN) 500 MG tablet Take 500 mg by mouth daily.   metoprolol tartrate (LOPRESSOR) 25 MG tablet Take 1 tablet (25 mg total) by mouth 2 (two) times daily.   omeprazole (PRILOSEC) 20 MG capsule Take 1 capsule (20 mg total) by mouth daily before breakfast.   OXYGEN Inhale 2 L into the lungs as needed (To keep O2 ststa > 90 %).   polyethylene glycol (MIRALAX / GLYCOLAX) 17 g packet Take  17 g by mouth at bedtime.   potassium chloride SA (KLOR-CON) 20 MEQ tablet Take 20 mEq by mouth 2 (two) times daily.   QUEtiapine (SEROQUEL) 25 MG tablet Take 25 mg by mouth 2 (two) times daily.   rOPINIRole (REQUIP) 0.25 MG tablet Take 0.25 mg by mouth at bedtime.   saline (AYR) GEL Place 1 Application into both nostrils every 6 (six) hours as needed.   UNABLE TO FIND Diet:Regular   venlafaxine XR (EFFEXOR-XR) 150 MG 24 hr capsule Take 150 mg by mouth daily with breakfast.   Vibegron (GEMTESA) 75 MG TABS Take 1 tablet by mouth daily as needed.   vitamin B-12 (CYANOCOBALAMIN) 1000 MCG tablet Take 1,000 mcg by mouth daily. for vitamin B 12 level 205   No facility-administered encounter medications on file as of 04/05/2022.     SIGNIFICANT DIAGNOSTIC EXAMS  PREVIOUS   09-10-21: chest x-ray: patch asymmetric inflammatory changes. These could represent areas of atypical infection; focal atelectasis infarction or evolving consolidation among other possibilities.  11-15-21: scrotal ultrasounds No evidence of intratesticular   mass or torsion bilateral  No evidence of varicoceles bilateral  Small sized right hydrocele Bilateral epididymal head is enlarges may represent epididymitis   01-23-22: dexa: t score -1.331  TODAY  04-02-22: thyroid ultrasound:  There are some other scattered small cysts in both lobes. No enlarged or abnormal appearing lymph nodes are identified. IMPRESSION: 1.3 cm right inferior cystic nodule does not meet criteria for biopsy or  follow-up.  04-05-22: EKG; sinus rhythm with  first degree av block    LABS REVIEWED PREVIOUS      03-13-21: wbc 7.3; hgb 14.0; hct 42.5; mcv 85.5 plt 212; glucose 126; bun 24; creat 1.03 ;k+ 3.9; na++ 139; ca 9.6; GFR>60 03-15-21: hgb a1c 5.7 03-20-21: wbc 20.3; hgb 14.6; hct 44.4; mcv 86.7 plt 229; glcusoe 201; bun 26; creat 1.44; k+ 4.2; na++ 138; ca 9.2; GFR 46  03-22-21: aldosterone 7.6; cortisol AM 19.5;  03-26-21: wbc 6.3; hgb 12.4; hct 37.9; mcv 87.5 plt 230; glucose 117; bun 28; creat 1.13; k+ 4.4; na++ 138; ca 9.2; GFR>60  04-23-21: PSA 0.6; testosterone: <3 08-10-21: PSA 0.17 testosterone <3 08-16-21: wbc 6.0; hgb 12.0; hct 37.1; mcv 89.0 plt 181; glucose 98; bun 40; creat 1.46; k+ 5.7; na++ 138; ca 9.3 GFR>46 protein 5.8; albumin 3.4; tsh 0.620 08-24-21: glucose 104; bun 46; creat 2.07; k+ 4.7; na++ 141; ca 8.5; GFR 30  08-30-21: vitamin B12  205  09-10-21: wbc 8.5; hgb 12.8; hct 38.5; mcv 87.3; plt 167; glucose 131; bun 25; creat 1.20; k+ 3.9; na++ 140; ca 9.6; GFR 58; iron 38; tibc 261; folate 14.2; CRP 6.7; d-dimer 0.43 09-17-21: CRP <0.5 10-18-21: wbc 6.1; hgb 13.3; hct 41.5; mcv 89.7 plt 190; glucose 91; bun 25; creat 0.95; k+ 3.4; na++ 142; ca 9.3; gfr >60; protein 6.0; albumin 3.5 01-31-22: glucose 100; bun 20; creat 0.94; k+ 3.5; na++ 143; ca 9.5 gfr >60 02-11-22: glucose 102; bun 19; creat 0.91; k+ 3.3; na++ 141; ca 9.3; gfr >60   TODAY  04-01-22: wbc 6.9; hgb 13.3; hct 41.0; mcv 86.9 plt 174; glucose 115; bun 37; creat 1.07; k+ 4.1; na++ 144; ca 9.7; gfr >60; protein 6.0; albumin 3.5; vitamin B12: 856; tsh 0.157   Review of Systems  Constitutional:  Negative for malaise/fatigue.  Respiratory:  Negative for cough and shortness of breath.   Cardiovascular:  Negative for chest pain, palpitations and leg swelling.  Gastrointestinal:  Negative for  abdominal pain, constipation and heartburn.  Musculoskeletal:  Negative for back pain, joint pain and myalgias.  Skin: Negative.    Neurological:  Negative for dizziness.  Psychiatric/Behavioral:  The patient is not nervous/anxious.    Physical Exam Constitutional:      General: He is not in acute distress.    Appearance: He is well-developed. He is not diaphoretic.  Neck:     Thyroid: Thyroid mass present. No thyromegaly.     Comments: 1.3 cm right inferior cystic nodule Cardiovascular:     Rate and Rhythm: Normal rate and regular rhythm.     Pulses: Normal pulses.     Heart sounds: Normal heart sounds.  Pulmonary:     Effort: Pulmonary effort is normal. No respiratory distress.     Breath sounds: Normal breath sounds.  Abdominal:     General: Bowel sounds are normal. There is no distension.     Palpations: Abdomen is soft.     Tenderness: There is no abdominal tenderness.  Musculoskeletal:        General: Normal range of motion.     Cervical back: Neck supple.     Right lower leg: No edema.     Left lower leg: No edema.  Lymphadenopathy:     Cervical: No cervical adenopathy.  Skin:    General: Skin is warm and dry.  Neurological:     Mental Status: He is alert. Mental status is at baseline.     Comments: Has mild lip puckering   Psychiatric:        Mood and Affect: Mood normal.      ASSESSMENT/ PLAN:  TODAY  Aortic atherosclerosis Vascular dementia with behavioral disturbance CKD (chronic kidney disease) stage 3b   Will stop vitamin C and fish oil in one week will lower seroquel to 25 mg nightly for one week then stop  Will check cbc; cmp bnp; troponin lactic acid.   Will continue current plan of care Will continue to monitor his status.    Time spent with patient: 40 minutes: medications; health status; dietary.    Ok Edwards NP [facility]   call [phone]

## 2022-04-08 DIAGNOSIS — F0390 Unspecified dementia without behavioral disturbance: Secondary | ICD-10-CM | POA: Diagnosis not present

## 2022-04-08 DIAGNOSIS — R1312 Dysphagia, oropharyngeal phase: Secondary | ICD-10-CM | POA: Diagnosis not present

## 2022-04-10 DIAGNOSIS — F0390 Unspecified dementia without behavioral disturbance: Secondary | ICD-10-CM | POA: Diagnosis not present

## 2022-04-10 DIAGNOSIS — R1312 Dysphagia, oropharyngeal phase: Secondary | ICD-10-CM | POA: Diagnosis not present

## 2022-04-11 DIAGNOSIS — R1312 Dysphagia, oropharyngeal phase: Secondary | ICD-10-CM | POA: Diagnosis not present

## 2022-04-11 DIAGNOSIS — F0390 Unspecified dementia without behavioral disturbance: Secondary | ICD-10-CM | POA: Diagnosis not present

## 2022-04-12 DIAGNOSIS — F0390 Unspecified dementia without behavioral disturbance: Secondary | ICD-10-CM | POA: Diagnosis not present

## 2022-04-12 DIAGNOSIS — R1312 Dysphagia, oropharyngeal phase: Secondary | ICD-10-CM | POA: Diagnosis not present

## 2022-04-13 DIAGNOSIS — R1312 Dysphagia, oropharyngeal phase: Secondary | ICD-10-CM | POA: Diagnosis not present

## 2022-04-13 DIAGNOSIS — F0390 Unspecified dementia without behavioral disturbance: Secondary | ICD-10-CM | POA: Diagnosis not present

## 2022-04-15 ENCOUNTER — Encounter: Payer: Self-pay | Admitting: Adult Health

## 2022-04-15 ENCOUNTER — Encounter: Payer: Self-pay | Admitting: Family Medicine

## 2022-04-15 ENCOUNTER — Non-Acute Institutional Stay (INDEPENDENT_AMBULATORY_CARE_PROVIDER_SITE_OTHER): Payer: Medicare Other | Admitting: Family Medicine

## 2022-04-15 DIAGNOSIS — F03C18 Unspecified dementia, severe, with other behavioral disturbance: Secondary | ICD-10-CM | POA: Diagnosis present

## 2022-04-15 DIAGNOSIS — H9190 Unspecified hearing loss, unspecified ear: Secondary | ICD-10-CM | POA: Insufficient documentation

## 2022-04-15 DIAGNOSIS — M8580 Other specified disorders of bone density and structure, unspecified site: Secondary | ICD-10-CM | POA: Insufficient documentation

## 2022-04-15 DIAGNOSIS — R1312 Dysphagia, oropharyngeal phase: Secondary | ICD-10-CM | POA: Diagnosis not present

## 2022-04-15 DIAGNOSIS — F0390 Unspecified dementia without behavioral disturbance: Secondary | ICD-10-CM | POA: Diagnosis not present

## 2022-04-15 NOTE — Progress Notes (Signed)
This encounter was created in error - please disregard.

## 2022-04-15 NOTE — Progress Notes (Signed)
Location:  [facility] of Service[facility]  CODE STATUS: DNR  Allergies  Allergen Reactions   Hctz [Hydrochlorothiazide]     hypokalemia   Xanax [Alprazolam] Other (See Comments)    Causes hallucinations    Chief Complaint  Patient presents with   Dementia   HPI: [name] was visited by the following members of [facility]: Dr. Ezequiel Essex (PGY3), Dr. Pearla Dubonnet (PGY2), and Dr. Sherren Mocha McDiarmid (faculty).  We performed our history and physical in his room, room #156.  He has no specific pain complaints at this time, and when generally prompted, he reports he is doing well.  Left hip pain Chronic, cannot elucidate acute worsening.  He reports that this has been an ongoing problem for years.  No known trauma or injuries.  Depressed mood Seroquel recently reduced.  Mr. Tozzi does not believe his depression is bothering him much now.  Has no complaints about it. He reports not enjoying much at all at the nursing home, however he does enjoy when his family visits.  Urinary incontinence Under the care of urologist for BPH, prostate cancer, and urinary incontinence.  Recently Myrbetriq removed.  He is still on 2 agents, alfuzosin 10 mg and Vibegron (gemtesa) 75 mg.  He reports ongoing urinary incontinence, worse at night.  Still urinating every 1-2 hours nightly.  He reports this is less frequent during the day, but he often cannot make it to the bathroom.  Weight loss Chart indicates patient has lost about 7 pounds since September.  He reports good appetite and no problems with his dentures at this time.  He does endorse some epigastric and generalized abdominal pain.  Difficult to get details on whether or not this is strictly postprandial or quarts other times.  No specific foods trigger.  Also endorses daily bowel movements that are hard and require him to strain.  Past Medical History:  Diagnosis Date   Anxiety    Cancer [facility])     prostate   Chronic chest wall pain    Closed displaced fracture of right femoral neck ([place]) 03/09/2020   9/30-10/10/2019 minimally displaced right femoral neck fracture sustained in a mechanical fall  Cannulated right hip pinning by Dr. Arther Abbott 10/1  Full dose aspirin prophylaxis through 11/5 with subsequent dosage of 81 mg daily.   Essential hypertension, benign 06/20/2016   GAD (generalized anxiety disorder)    GERD (gastroesophageal reflux disease)    H/O echocardiogram 2016   normal   Heart murmur    "leaking heart valve"   High cholesterol 06/20/2016   HOH (hard of hearing)    left   Hx of falling    Interstitial pulmonary disease (HCC)    Lower GI bleed    MDD (major depressive disorder)    Murmur, cardiac    Poor historian    Prostate cancer ([place])    PUD (peptic ulcer disease)    RLS (restless legs syndrome)    Shortness of breath    Wrist fracture 12/16/2011    Past Surgical History:  Procedure Laterality Date   CHOLECYSTECTOMY N/A 06/28/2020   Procedure: LAPAROSCOPIC CHOLECYSTECTOMY;  Surgeon: [name], MD;  Location: [facility];  Service: General;  Laterality: N/A;  pt in [place]  2005   [place]: Sigmoid colon diverticulosis, moderate sized external hemorrhoids. A few focal areas of mucosal erythema felt to be nonspecific.   ESOPHAGOGASTRODUODENOSCOPY     ESOPHAGOGASTRODUODENOSCOPY N/A  07/18/2016   [facility]: normal exam except duodenal scar. h.pylori serologies negative   FLEXIBLE SIGMOIDOSCOPY N/A 06/11/2020   Procedure: FLEXIBLE SIGMOIDOSCOPY;  Surgeon: Eloise Harman, DO;  Location: [facility]ITE;  Service: Endoscopy;  Laterality: N/A;   HARDWARE REMOVAL Right 03/20/2021   Procedure: HARDWARE REMOVAL right hip;  Surgeon: Carole Civil, MD;  Location: [facility];  Service: Orthopedics;  Laterality: Right;   HIP PINNING,CANNULATED Right 03/10/2020   Procedure: INTERNAL FIXATION RIGHT HIP;  Surgeon: Carole Civil, MD;  Location: [facility]  ORS;  Service: Orthopedics;  Laterality: Right;   ORIF WRIST FRACTURE  12/19/2011   Procedure: OPEN REDUCTION INTERNAL FIXATION (ORIF) WRIST FRACTURE;  Surgeon: Carole Civil, MD;  Location: [facility];  Service: Orthopedics;  Laterality: Right;   prostate cancer     Diagnosed in the 90s   TONSILLECTOMY      Social History   Socioeconomic History   Marital status: Widowed    Spouse name: Not on file   Number of children: Not on file   Years of education: Not on file   Highest education level: Not on file  Occupational History   Occupation: retired    Fish farm manager: RETIRED  Tobacco Use   Smoking status: Never   Smokeless tobacco: Never  Vaping Use   Vaping Use: Never used  Substance and Sexual Activity   Alcohol use: No   Drug use: No   Sexual activity: Not Currently  Other Topics Concern   Not on file  Social History Narrative   Is long term resident of [facility]    Social Determinants of Health   Financial Resource Strain: Not on file  Food Insecurity: Not on file  Transportation Needs: Not on file  Physical Activity: Not on file  Stress: Not on file  Social Connections: Not on file  Intimate Partner Violence: Not on file   Family History  Problem Relation Age of Onset   Cancer Other    Heart attack Mother    Cancer Brother    Heart attack Brother    Heart disease Sister        by pass   VITAL SIGNS There were no vitals taken for this visit. Vital signs reviewed in chart, diastolic pressures appropriate.  Since September, range 61-79.  General: Awake, alert, generally hard of hearing HEENT: Peripheral vision difficult to elucidate due to cooperation, but could generally identify number of fingers held in left and right periphery Cardiac: Regular rate and rhythm, no murmurs appreciated Respiratory: CTAB, although somewhat difficult to auscultate due to poor effort Abdominal: Mild abdominal distention, no TTP, bowel sounds present Neuro: No cogwheel rigidity noted of  BUE, smooth passive movements, able to move all extremities spontaneously MSK: Able to use feet to propel wheelchair towards bed, able to use handrails to stand, pivot, and lay himself in bed from the wheelchair  Outpatient Encounter Medications as of 04/15/2022  Medication Sig   acetaminophen (TYLENOL) 500 MG tablet Take 500 mg by mouth every 6 (six) hours. Max 30108m in 24 hour period   alfuzosin (UROXATRAL) 10 MG 24 hr tablet Take 10 mg by mouth daily. 6 pm   Ascorbic Acid (VITAMIN C) 1000 MG tablet Take 1,000 mg by mouth daily.   calcium carbonate (TUMS - DOSED IN MG ELEMENTAL CALCIUM) 500 MG chewable tablet Chew 1 tablet by mouth daily.   Cholecalciferol (D3-1000) 25 MCG (1000 UT) tablet Take 1,000 Units by mouth daily.   dilTIAZem HCl ER 300 MG  TB24 Take 1 capsule by mouth daily.   Flax Oil-Fish Oil-Borage Oil (FISH OIL-FLAX OIL-BORAGE OIL) CAPS Take 1 capsule by mouth daily.   hydrALAZINE (APRESOLINE) 10 MG tablet Take 10 mg by mouth 3 (three) times daily. Hypertensive chronic kidney disease with stage 1 through stage 4 chronic kidney disease, or unspecified chronic kidney disease   isosorbide mononitrate (IMDUR) 60 MG 24 hr tablet Take 60 mg by mouth daily.   lisinopril (ZESTRIL) 40 MG tablet Take 40 mg by mouth daily. Hypertensive chronic kidney disease with stage 1 through stage 4 chronic kidney disease, or unspecified chronic kidney disease   loratadine (CLARITIN) 10 MG tablet Take 10 mg by mouth daily.   Melatonin 10 MG TABS Take 10 mg by mouth at bedtime.   memantine (NAMENDA) 10 MG tablet Take 10 mg by mouth 2 (two) times daily.   methocarbamol (ROBAXIN) 500 MG tablet Take 500 mg by mouth daily.   metoprolol tartrate (LOPRESSOR) 25 MG tablet Take 1 tablet (25 mg total) by mouth 2 (two) times daily.   omeprazole (PRILOSEC) 20 MG capsule Take 1 capsule (20 mg total) by mouth daily before breakfast.   OXYGEN Inhale 2 L into the lungs as needed (To keep O2 ststa > 90 %).   polyethylene  glycol (MIRALAX / GLYCOLAX) 17 g packet Take 17 g by mouth at bedtime.   potassium chloride SA (KLOR-CON) 20 MEQ tablet Take 20 mEq by mouth 2 (two) times daily.   QUEtiapine (SEROQUEL) 25 MG tablet Take 25 mg by mouth 2 (two) times daily.   rOPINIRole (REQUIP) 0.25 MG tablet Take 0.25 mg by mouth at bedtime.   saline (AYR) GEL Place 1 Application into both nostrils every 6 (six) hours as needed.   UNABLE TO FIND Diet:Regular   venlafaxine XR (EFFEXOR-XR) 150 MG 24 hr capsule Take 150 mg by mouth daily with breakfast.   Vibegron (GEMTESA) 75 MG TABS Take 1 tablet by mouth daily as needed.   vitamin B-12 (CYANOCOBALAMIN) 1000 MCG tablet Take 1,000 mcg by mouth daily. for vitamin B 12 level 205   No facility-administered encounter medications on file as of 04/15/2022.   SIGNIFICANT DIAGNOSTIC EXAMS  CT HEAD WITHOUT CONTRAST 04/01/2022 FINDINGS: Brain: Chronic white matter ischemic change. No evidence of acute infarction, hemorrhage, hydrocephalus, extra-axial collection or mass lesion/mass effect. Vascular: No hyperdense vessel or unexpected calcification. Skull: Normal. Negative for fracture or focal lesion Sinuses/Orbits: No acute finding. Other: None. IMPRESSION: No acute intracranial abnormality.   THYROID ULTRASOUND 04/02/2022 FINDINGS: Parenchymal Echotexture: Mildly heterogenous Isthmus: 0.5 cm Right lobe: 3.2 x 1.8 x 2.1 cm Left lobe: 3.4 x 1.2 x 1.5 cm _______________________________________________________ Estimated total number of nodules >/= 1 cm: 1 Number of spongiform nodules >/=  2 cm not described below (TR1): 0 Number of mixed cystic and solid nodules >/= 1.5 cm not described below ([place]): 0 _______________________________________________________  Nodule # 1: Location: Right; Inferior Maximum size: 1.3 cm; Other 2 dimensions: 1.2 x 1.0 cm Composition: cystic/almost completely cystic (0) Echogenicity: anechoic (0) Shape: not taller-than-wide (0) Margins:  smooth (0) Echogenic foci: none (0) ACR TI-RADS total points: 0. ACR TI-RADS risk category: TR1 (0-1 points). ACR TI-RADS recommendations:   This nodule does NOT meet TI-RADS criteria for biopsy or dedicated follow-up. _______________________________________________________   There are some other scattered small cysts in both lobes. No enlarged or abnormal appearing lymph nodes are identified.   IMPRESSION: 1.3 cm right inferior cystic nodule does not meet criteria for biopsy or follow-up.  ASSESSMENT/ PLAN:  Left hip pain Up out of bed as tolerated.  Could trial PT.  Has CKD stage III, so would avoid p.o. NSAIDs.  However, could apply Voltaren to left hip.  Also consider lidocaine patches or cream.   Depressed mood, dementia with behavioral disturbance Agree with Seroquel taper off.  Would keep venlafaxine at current dose.  Of note, venlafaxine can produce cognitive decline in elderly populations. Monitor for agitation, psychosis, and other behavior changes off of Seroquel.  CT head and thyroid studies unremarkable - no acute intracranial or thyroid conditions thought to be etiology.  Urinary incontinence Continue current agents, alfuzosin 10 mg and Vibegron (gemtesa) 75 mg.  Consider encouraging timed voiding on his own if possible.  Consider avoiding or reducing fluid intake before bed.  Weight loss Wonderfully due to cognitive decline versus postprandial abdominal pain.  He is currently on MiraLAX nightly and Protonix 20 mg daily.  Could increase MiraLAX to twice daily, titrating to loose stools.  Would not recommend additional acid reducer on top of Protonix, but we do have room to go up on Protonix if he reports epigastric burning or similar acid reflux symptoms.  Continue protein supplement shake.  Hypertension Pressures since September reviewed,  all within good ranges.  Systolics range 440-347, diastolics range 42-59.  Given normal diastolics, would not titrate  antihypertensives any higher out of concern for orthostatic hypotension.   Thank you for the opportunity to collaborate in the care for this patient.  Ezequiel Essex, MD PGY-3 [place]

## 2022-04-17 DIAGNOSIS — F331 Major depressive disorder, recurrent, moderate: Secondary | ICD-10-CM | POA: Diagnosis not present

## 2022-04-25 NOTE — Addendum Note (Signed)
Addended by: Gerlene Fee on: 04/25/2022 03:36 PM   Modules accepted: Level of Service

## 2022-04-26 DIAGNOSIS — R262 Difficulty in walking, not elsewhere classified: Secondary | ICD-10-CM | POA: Diagnosis not present

## 2022-04-26 DIAGNOSIS — M6281 Muscle weakness (generalized): Secondary | ICD-10-CM | POA: Diagnosis not present

## 2022-04-26 DIAGNOSIS — Z9181 History of falling: Secondary | ICD-10-CM | POA: Diagnosis not present

## 2022-04-26 DIAGNOSIS — R278 Other lack of coordination: Secondary | ICD-10-CM | POA: Diagnosis not present

## 2022-04-26 DIAGNOSIS — M5136 Other intervertebral disc degeneration, lumbar region: Secondary | ICD-10-CM | POA: Diagnosis not present

## 2022-04-26 DIAGNOSIS — F0390 Unspecified dementia without behavioral disturbance: Secondary | ICD-10-CM | POA: Diagnosis not present

## 2022-04-26 DIAGNOSIS — Z741 Need for assistance with personal care: Secondary | ICD-10-CM | POA: Diagnosis not present

## 2022-04-28 DIAGNOSIS — R278 Other lack of coordination: Secondary | ICD-10-CM | POA: Diagnosis not present

## 2022-04-28 DIAGNOSIS — M5136 Other intervertebral disc degeneration, lumbar region: Secondary | ICD-10-CM | POA: Diagnosis not present

## 2022-04-28 DIAGNOSIS — M6281 Muscle weakness (generalized): Secondary | ICD-10-CM | POA: Diagnosis not present

## 2022-04-28 DIAGNOSIS — Z9181 History of falling: Secondary | ICD-10-CM | POA: Diagnosis not present

## 2022-04-28 DIAGNOSIS — R262 Difficulty in walking, not elsewhere classified: Secondary | ICD-10-CM | POA: Diagnosis not present

## 2022-04-28 DIAGNOSIS — F0390 Unspecified dementia without behavioral disturbance: Secondary | ICD-10-CM | POA: Diagnosis not present

## 2022-04-29 ENCOUNTER — Other Ambulatory Visit (HOSPITAL_COMMUNITY)
Admission: RE | Admit: 2022-04-29 | Discharge: 2022-04-29 | Disposition: A | Discharge status: Home / Self Care | Payer: Medicare Other | Source: Skilled Nursing Facility | Attending: Adult Health | Admitting: Adult Health

## 2022-04-29 ENCOUNTER — Encounter (INDEPENDENT_AMBULATORY_CARE_PROVIDER_SITE_OTHER): Payer: Self-pay | Admitting: Gastroenterology

## 2022-04-29 DIAGNOSIS — F0390 Unspecified dementia without behavioral disturbance: Secondary | ICD-10-CM | POA: Diagnosis not present

## 2022-04-29 DIAGNOSIS — Z125 Encounter for screening for malignant neoplasm of prostate: Secondary | ICD-10-CM | POA: Diagnosis not present

## 2022-04-29 DIAGNOSIS — N179 Acute kidney failure, unspecified: Secondary | ICD-10-CM | POA: Insufficient documentation

## 2022-04-29 DIAGNOSIS — R262 Difficulty in walking, not elsewhere classified: Secondary | ICD-10-CM | POA: Diagnosis not present

## 2022-04-29 DIAGNOSIS — Z9181 History of falling: Secondary | ICD-10-CM | POA: Diagnosis not present

## 2022-04-29 DIAGNOSIS — R972 Elevated prostate specific antigen [PSA]: Secondary | ICD-10-CM | POA: Insufficient documentation

## 2022-04-29 DIAGNOSIS — M5136 Other intervertebral disc degeneration, lumbar region: Secondary | ICD-10-CM | POA: Diagnosis not present

## 2022-04-29 DIAGNOSIS — M6281 Muscle weakness (generalized): Secondary | ICD-10-CM | POA: Diagnosis not present

## 2022-04-29 DIAGNOSIS — R278 Other lack of coordination: Secondary | ICD-10-CM | POA: Diagnosis not present

## 2022-04-29 LAB — PSA: Prostatic Specific Antigen: 1.22 ng/mL (ref 0.00–4.00)

## 2022-04-30 DIAGNOSIS — M6281 Muscle weakness (generalized): Secondary | ICD-10-CM | POA: Diagnosis not present

## 2022-04-30 DIAGNOSIS — M5136 Other intervertebral disc degeneration, lumbar region: Secondary | ICD-10-CM | POA: Diagnosis not present

## 2022-04-30 DIAGNOSIS — Z9181 History of falling: Secondary | ICD-10-CM | POA: Diagnosis not present

## 2022-04-30 DIAGNOSIS — F0390 Unspecified dementia without behavioral disturbance: Secondary | ICD-10-CM | POA: Diagnosis not present

## 2022-04-30 DIAGNOSIS — R262 Difficulty in walking, not elsewhere classified: Secondary | ICD-10-CM | POA: Diagnosis not present

## 2022-04-30 DIAGNOSIS — R278 Other lack of coordination: Secondary | ICD-10-CM | POA: Diagnosis not present

## 2022-05-01 DIAGNOSIS — F0390 Unspecified dementia without behavioral disturbance: Secondary | ICD-10-CM | POA: Diagnosis not present

## 2022-05-01 DIAGNOSIS — R278 Other lack of coordination: Secondary | ICD-10-CM | POA: Diagnosis not present

## 2022-05-01 DIAGNOSIS — R262 Difficulty in walking, not elsewhere classified: Secondary | ICD-10-CM | POA: Diagnosis not present

## 2022-05-01 DIAGNOSIS — M6281 Muscle weakness (generalized): Secondary | ICD-10-CM | POA: Diagnosis not present

## 2022-05-01 DIAGNOSIS — F331 Major depressive disorder, recurrent, moderate: Secondary | ICD-10-CM | POA: Diagnosis not present

## 2022-05-01 DIAGNOSIS — Z9181 History of falling: Secondary | ICD-10-CM | POA: Diagnosis not present

## 2022-05-01 DIAGNOSIS — M5136 Other intervertebral disc degeneration, lumbar region: Secondary | ICD-10-CM | POA: Diagnosis not present

## 2022-05-02 DIAGNOSIS — R278 Other lack of coordination: Secondary | ICD-10-CM | POA: Diagnosis not present

## 2022-05-02 DIAGNOSIS — Z9181 History of falling: Secondary | ICD-10-CM | POA: Diagnosis not present

## 2022-05-02 DIAGNOSIS — F0390 Unspecified dementia without behavioral disturbance: Secondary | ICD-10-CM | POA: Diagnosis not present

## 2022-05-02 DIAGNOSIS — R262 Difficulty in walking, not elsewhere classified: Secondary | ICD-10-CM | POA: Diagnosis not present

## 2022-05-02 DIAGNOSIS — M5136 Other intervertebral disc degeneration, lumbar region: Secondary | ICD-10-CM | POA: Diagnosis not present

## 2022-05-02 DIAGNOSIS — M6281 Muscle weakness (generalized): Secondary | ICD-10-CM | POA: Diagnosis not present

## 2022-05-06 DIAGNOSIS — Z9181 History of falling: Secondary | ICD-10-CM | POA: Diagnosis not present

## 2022-05-06 DIAGNOSIS — F0390 Unspecified dementia without behavioral disturbance: Secondary | ICD-10-CM | POA: Diagnosis not present

## 2022-05-06 DIAGNOSIS — R278 Other lack of coordination: Secondary | ICD-10-CM | POA: Diagnosis not present

## 2022-05-06 DIAGNOSIS — M6281 Muscle weakness (generalized): Secondary | ICD-10-CM | POA: Diagnosis not present

## 2022-05-06 DIAGNOSIS — M5136 Other intervertebral disc degeneration, lumbar region: Secondary | ICD-10-CM | POA: Diagnosis not present

## 2022-05-06 DIAGNOSIS — R262 Difficulty in walking, not elsewhere classified: Secondary | ICD-10-CM | POA: Diagnosis not present

## 2022-05-07 DIAGNOSIS — M6281 Muscle weakness (generalized): Secondary | ICD-10-CM | POA: Diagnosis not present

## 2022-05-07 DIAGNOSIS — Z9181 History of falling: Secondary | ICD-10-CM | POA: Diagnosis not present

## 2022-05-07 DIAGNOSIS — M5136 Other intervertebral disc degeneration, lumbar region: Secondary | ICD-10-CM | POA: Diagnosis not present

## 2022-05-07 DIAGNOSIS — F0390 Unspecified dementia without behavioral disturbance: Secondary | ICD-10-CM | POA: Diagnosis not present

## 2022-05-07 DIAGNOSIS — R278 Other lack of coordination: Secondary | ICD-10-CM | POA: Diagnosis not present

## 2022-05-07 DIAGNOSIS — R262 Difficulty in walking, not elsewhere classified: Secondary | ICD-10-CM | POA: Diagnosis not present

## 2022-05-08 DIAGNOSIS — R278 Other lack of coordination: Secondary | ICD-10-CM | POA: Diagnosis not present

## 2022-05-08 DIAGNOSIS — M5136 Other intervertebral disc degeneration, lumbar region: Secondary | ICD-10-CM | POA: Diagnosis not present

## 2022-05-08 DIAGNOSIS — M6281 Muscle weakness (generalized): Secondary | ICD-10-CM | POA: Diagnosis not present

## 2022-05-08 DIAGNOSIS — Z9181 History of falling: Secondary | ICD-10-CM | POA: Diagnosis not present

## 2022-05-08 DIAGNOSIS — R262 Difficulty in walking, not elsewhere classified: Secondary | ICD-10-CM | POA: Diagnosis not present

## 2022-05-08 DIAGNOSIS — F0390 Unspecified dementia without behavioral disturbance: Secondary | ICD-10-CM | POA: Diagnosis not present

## 2022-05-09 DIAGNOSIS — F0390 Unspecified dementia without behavioral disturbance: Secondary | ICD-10-CM | POA: Diagnosis not present

## 2022-05-09 DIAGNOSIS — M6281 Muscle weakness (generalized): Secondary | ICD-10-CM | POA: Diagnosis not present

## 2022-05-09 DIAGNOSIS — R278 Other lack of coordination: Secondary | ICD-10-CM | POA: Diagnosis not present

## 2022-05-09 DIAGNOSIS — M5136 Other intervertebral disc degeneration, lumbar region: Secondary | ICD-10-CM | POA: Diagnosis not present

## 2022-05-09 DIAGNOSIS — R262 Difficulty in walking, not elsewhere classified: Secondary | ICD-10-CM | POA: Diagnosis not present

## 2022-05-09 DIAGNOSIS — Z9181 History of falling: Secondary | ICD-10-CM | POA: Diagnosis not present

## 2022-05-10 DIAGNOSIS — M5136 Other intervertebral disc degeneration, lumbar region: Secondary | ICD-10-CM | POA: Diagnosis not present

## 2022-05-10 DIAGNOSIS — R262 Difficulty in walking, not elsewhere classified: Secondary | ICD-10-CM | POA: Diagnosis not present

## 2022-05-10 DIAGNOSIS — F0390 Unspecified dementia without behavioral disturbance: Secondary | ICD-10-CM | POA: Diagnosis not present

## 2022-05-10 DIAGNOSIS — Z9181 History of falling: Secondary | ICD-10-CM | POA: Diagnosis not present

## 2022-05-10 DIAGNOSIS — Z741 Need for assistance with personal care: Secondary | ICD-10-CM | POA: Diagnosis not present

## 2022-05-10 DIAGNOSIS — M6281 Muscle weakness (generalized): Secondary | ICD-10-CM | POA: Diagnosis not present

## 2022-05-10 DIAGNOSIS — R278 Other lack of coordination: Secondary | ICD-10-CM | POA: Diagnosis not present

## 2022-05-12 ENCOUNTER — Encounter (HOSPITAL_COMMUNITY)
Admission: RE | Admit: 2022-05-12 | Discharge: 2022-05-12 | Disposition: A | Discharge status: Home / Self Care | Payer: Medicare Other | Source: Skilled Nursing Facility | Attending: Adult Health | Admitting: Adult Health

## 2022-05-12 DIAGNOSIS — N179 Acute kidney failure, unspecified: Secondary | ICD-10-CM | POA: Insufficient documentation

## 2022-05-12 DIAGNOSIS — I129 Hypertensive chronic kidney disease with stage 1 through stage 4 chronic kidney disease, or unspecified chronic kidney disease: Secondary | ICD-10-CM | POA: Insufficient documentation

## 2022-05-13 DIAGNOSIS — R262 Difficulty in walking, not elsewhere classified: Secondary | ICD-10-CM | POA: Diagnosis not present

## 2022-05-13 DIAGNOSIS — M5136 Other intervertebral disc degeneration, lumbar region: Secondary | ICD-10-CM | POA: Diagnosis not present

## 2022-05-13 DIAGNOSIS — F0390 Unspecified dementia without behavioral disturbance: Secondary | ICD-10-CM | POA: Diagnosis not present

## 2022-05-13 DIAGNOSIS — R278 Other lack of coordination: Secondary | ICD-10-CM | POA: Diagnosis not present

## 2022-05-13 DIAGNOSIS — Z9181 History of falling: Secondary | ICD-10-CM | POA: Diagnosis not present

## 2022-05-13 DIAGNOSIS — M6281 Muscle weakness (generalized): Secondary | ICD-10-CM | POA: Diagnosis not present

## 2022-05-14 DIAGNOSIS — Z9181 History of falling: Secondary | ICD-10-CM | POA: Diagnosis not present

## 2022-05-14 DIAGNOSIS — R262 Difficulty in walking, not elsewhere classified: Secondary | ICD-10-CM | POA: Diagnosis not present

## 2022-05-14 DIAGNOSIS — M5136 Other intervertebral disc degeneration, lumbar region: Secondary | ICD-10-CM | POA: Diagnosis not present

## 2022-05-14 DIAGNOSIS — F0390 Unspecified dementia without behavioral disturbance: Secondary | ICD-10-CM | POA: Diagnosis not present

## 2022-05-14 DIAGNOSIS — R278 Other lack of coordination: Secondary | ICD-10-CM | POA: Diagnosis not present

## 2022-05-14 DIAGNOSIS — M6281 Muscle weakness (generalized): Secondary | ICD-10-CM | POA: Diagnosis not present

## 2022-05-14 LAB — TESTOSTERONE: Testosterone: 28 ng/dL — ABNORMAL LOW (ref 264–916)

## 2022-05-15 DIAGNOSIS — R278 Other lack of coordination: Secondary | ICD-10-CM | POA: Diagnosis not present

## 2022-05-15 DIAGNOSIS — F0390 Unspecified dementia without behavioral disturbance: Secondary | ICD-10-CM | POA: Diagnosis not present

## 2022-05-15 DIAGNOSIS — M5136 Other intervertebral disc degeneration, lumbar region: Secondary | ICD-10-CM | POA: Diagnosis not present

## 2022-05-15 DIAGNOSIS — M6281 Muscle weakness (generalized): Secondary | ICD-10-CM | POA: Diagnosis not present

## 2022-05-15 DIAGNOSIS — Z9181 History of falling: Secondary | ICD-10-CM | POA: Diagnosis not present

## 2022-05-15 DIAGNOSIS — R262 Difficulty in walking, not elsewhere classified: Secondary | ICD-10-CM | POA: Diagnosis not present

## 2022-05-16 DIAGNOSIS — R278 Other lack of coordination: Secondary | ICD-10-CM | POA: Diagnosis not present

## 2022-05-16 DIAGNOSIS — R262 Difficulty in walking, not elsewhere classified: Secondary | ICD-10-CM | POA: Diagnosis not present

## 2022-05-16 DIAGNOSIS — M6281 Muscle weakness (generalized): Secondary | ICD-10-CM | POA: Diagnosis not present

## 2022-05-16 DIAGNOSIS — Z9181 History of falling: Secondary | ICD-10-CM | POA: Diagnosis not present

## 2022-05-16 DIAGNOSIS — M5136 Other intervertebral disc degeneration, lumbar region: Secondary | ICD-10-CM | POA: Diagnosis not present

## 2022-05-16 DIAGNOSIS — F0390 Unspecified dementia without behavioral disturbance: Secondary | ICD-10-CM | POA: Diagnosis not present

## 2022-05-17 DIAGNOSIS — F0390 Unspecified dementia without behavioral disturbance: Secondary | ICD-10-CM | POA: Diagnosis not present

## 2022-05-17 DIAGNOSIS — F331 Major depressive disorder, recurrent, moderate: Secondary | ICD-10-CM | POA: Diagnosis not present

## 2022-05-17 DIAGNOSIS — M6281 Muscle weakness (generalized): Secondary | ICD-10-CM | POA: Diagnosis not present

## 2022-05-17 DIAGNOSIS — R278 Other lack of coordination: Secondary | ICD-10-CM | POA: Diagnosis not present

## 2022-05-17 DIAGNOSIS — R262 Difficulty in walking, not elsewhere classified: Secondary | ICD-10-CM | POA: Diagnosis not present

## 2022-05-17 DIAGNOSIS — M5136 Other intervertebral disc degeneration, lumbar region: Secondary | ICD-10-CM | POA: Diagnosis not present

## 2022-05-17 DIAGNOSIS — Z9181 History of falling: Secondary | ICD-10-CM | POA: Diagnosis not present

## 2022-05-18 DIAGNOSIS — R262 Difficulty in walking, not elsewhere classified: Secondary | ICD-10-CM | POA: Diagnosis not present

## 2022-05-18 DIAGNOSIS — M6281 Muscle weakness (generalized): Secondary | ICD-10-CM | POA: Diagnosis not present

## 2022-05-18 DIAGNOSIS — F0390 Unspecified dementia without behavioral disturbance: Secondary | ICD-10-CM | POA: Diagnosis not present

## 2022-05-18 DIAGNOSIS — Z9181 History of falling: Secondary | ICD-10-CM | POA: Diagnosis not present

## 2022-05-18 DIAGNOSIS — R278 Other lack of coordination: Secondary | ICD-10-CM | POA: Diagnosis not present

## 2022-05-18 DIAGNOSIS — M5136 Other intervertebral disc degeneration, lumbar region: Secondary | ICD-10-CM | POA: Diagnosis not present

## 2022-05-20 DIAGNOSIS — Z9181 History of falling: Secondary | ICD-10-CM | POA: Diagnosis not present

## 2022-05-20 DIAGNOSIS — R262 Difficulty in walking, not elsewhere classified: Secondary | ICD-10-CM | POA: Diagnosis not present

## 2022-05-20 DIAGNOSIS — M5136 Other intervertebral disc degeneration, lumbar region: Secondary | ICD-10-CM | POA: Diagnosis not present

## 2022-05-20 DIAGNOSIS — R278 Other lack of coordination: Secondary | ICD-10-CM | POA: Diagnosis not present

## 2022-05-20 DIAGNOSIS — M6281 Muscle weakness (generalized): Secondary | ICD-10-CM | POA: Diagnosis not present

## 2022-05-20 DIAGNOSIS — F0390 Unspecified dementia without behavioral disturbance: Secondary | ICD-10-CM | POA: Diagnosis not present

## 2022-05-21 DIAGNOSIS — M6281 Muscle weakness (generalized): Secondary | ICD-10-CM | POA: Diagnosis not present

## 2022-05-21 DIAGNOSIS — F0390 Unspecified dementia without behavioral disturbance: Secondary | ICD-10-CM | POA: Diagnosis not present

## 2022-05-21 DIAGNOSIS — R278 Other lack of coordination: Secondary | ICD-10-CM | POA: Diagnosis not present

## 2022-05-21 DIAGNOSIS — M5136 Other intervertebral disc degeneration, lumbar region: Secondary | ICD-10-CM | POA: Diagnosis not present

## 2022-05-21 DIAGNOSIS — Z9181 History of falling: Secondary | ICD-10-CM | POA: Diagnosis not present

## 2022-05-21 DIAGNOSIS — R262 Difficulty in walking, not elsewhere classified: Secondary | ICD-10-CM | POA: Diagnosis not present

## 2022-05-22 DIAGNOSIS — F0390 Unspecified dementia without behavioral disturbance: Secondary | ICD-10-CM | POA: Diagnosis not present

## 2022-05-22 DIAGNOSIS — R278 Other lack of coordination: Secondary | ICD-10-CM | POA: Diagnosis not present

## 2022-05-22 DIAGNOSIS — Z9181 History of falling: Secondary | ICD-10-CM | POA: Diagnosis not present

## 2022-05-22 DIAGNOSIS — M6281 Muscle weakness (generalized): Secondary | ICD-10-CM | POA: Diagnosis not present

## 2022-05-22 DIAGNOSIS — R262 Difficulty in walking, not elsewhere classified: Secondary | ICD-10-CM | POA: Diagnosis not present

## 2022-05-22 DIAGNOSIS — M5136 Other intervertebral disc degeneration, lumbar region: Secondary | ICD-10-CM | POA: Diagnosis not present

## 2022-05-23 ENCOUNTER — Non-Acute Institutional Stay (SKILLED_NURSING_FACILITY): Payer: Medicare Other | Admitting: Internal Medicine

## 2022-05-23 ENCOUNTER — Encounter: Payer: Self-pay | Admitting: Internal Medicine

## 2022-05-23 DIAGNOSIS — E441 Mild protein-calorie malnutrition: Secondary | ICD-10-CM | POA: Diagnosis not present

## 2022-05-23 DIAGNOSIS — I10 Essential (primary) hypertension: Secondary | ICD-10-CM

## 2022-05-23 DIAGNOSIS — F0390 Unspecified dementia without behavioral disturbance: Secondary | ICD-10-CM | POA: Diagnosis not present

## 2022-05-23 DIAGNOSIS — D508 Other iron deficiency anemias: Secondary | ICD-10-CM

## 2022-05-23 DIAGNOSIS — R7989 Other specified abnormal findings of blood chemistry: Secondary | ICD-10-CM

## 2022-05-23 DIAGNOSIS — M6281 Muscle weakness (generalized): Secondary | ICD-10-CM | POA: Diagnosis not present

## 2022-05-23 DIAGNOSIS — I700 Atherosclerosis of aorta: Secondary | ICD-10-CM | POA: Diagnosis not present

## 2022-05-23 DIAGNOSIS — R278 Other lack of coordination: Secondary | ICD-10-CM | POA: Diagnosis not present

## 2022-05-23 DIAGNOSIS — R262 Difficulty in walking, not elsewhere classified: Secondary | ICD-10-CM | POA: Diagnosis not present

## 2022-05-23 DIAGNOSIS — M5136 Other intervertebral disc degeneration, lumbar region: Secondary | ICD-10-CM | POA: Diagnosis not present

## 2022-05-23 DIAGNOSIS — Z9181 History of falling: Secondary | ICD-10-CM | POA: Diagnosis not present

## 2022-05-23 NOTE — Assessment & Plan Note (Signed)
Since September systolics have ranged from 138 up to today's 824 and diastolics 17-53.  This is despite 4 drug therapy with diltiazem, hydralazine, lisinopril, and metoprolol.  Monitor will continue and the metoprolol dose increased if there is persistent systolic hypertension.

## 2022-05-23 NOTE — Assessment & Plan Note (Addendum)
He describes left chest pain up to level 9 after exercise last week.  When I asked if he told the staff; his response was "they knew about it."  I asked him to alert the staff should he have chest pain.  Imdur will be continued.  If significant systolic hypertension persists; the beta-blocker could be titrated up to 50 mg twice daily.

## 2022-05-23 NOTE — Assessment & Plan Note (Addendum)
Most recent TSH was reduced at 0.157 with a normal free T4.  This may represent subclinical hyperthyroidism.  Repeat TSH with total T3 and free T4 in 1 month.

## 2022-05-23 NOTE — Assessment & Plan Note (Signed)
Minimal anemia as recurred with H/H of 12.8/39.1, down from prior values of 13.3/41.  No bleeding dyscrasias reported.  Continue to monitor.

## 2022-05-23 NOTE — Progress Notes (Signed)
NURSING HOME LOCATION:  [facility] ROOM NUMBER:  [id number]  CODE STATUS:  DNR  PCP:  Ok Edwards NP  This is a nursing facility follow up visit of chronic medical diagnoses & to document compliance with Regulation 483.30 (c) in The [place] Manual Phase 2 which mandates caregiver visit ( visits can alternate among physician, PA or NP as per statutes) within 10 days of 30 days / 60 days/ 90 days post admission to SNF date    Interim medical record and care since last SNF visit was updated with review of diagnostic studies and change in clinical status since last visit were documented.  HPI: He is a permanent resident of this facility with medical diagnoses of history of prostate cancer, essential hypertension, GAD, GERD, dyslipidemia, IPF, RLS, and history of peptic ulcer disease.  Labs are current and revealed CKD stage II with creatinine of 1.14 and GFR greater than 60.  Minimal prerenal azotemia was present with a BUN of 28.  Total protein was slightly reduced at 6.4 but albumin was low normal at 3.7. Minimal anemia was present with H/H of 12.8/39.1, down from prior values of 13.3/41.  TSH was reduced at 0.157; free T4 was normal at 0.8.  Review of systems: Dementia invalidated responses.  Initially he told me he was doing "all right."  He then validated having heartburn and occasional dysphagia.  He also mentioned that he had chest pain last week "from exertion."  This was described as localized to the left chest without radiation and "hard" up to a level 9.  When I asked if he had told the staff , he stated "they knew about it."  In reference to the mild anemia; he denies any bleeding dyscrasias.  Constitutional: No fever, significant weight change, fatigue  Eyes: No redness, discharge, pain, vision change ENT/mouth: No nasal congestion,  purulent discharge, earache, change in hearing, sore throat  Cardiovascular: No palpitations, paroxysmal nocturnal  dyspnea, claudication, edema  Respiratory: No cough, sputum production, hemoptysis, DOE, significant snoring, apnea   Gastrointestinal: No abdominal pain, nausea /vomiting, rectal bleeding, melena, change in bowels Genitourinary: No dysuria, hematuria, pyuria, incontinence, nocturia Musculoskeletal: No joint stiffness, joint swelling, weakness, pain Dermatologic: No rash, pruritus, change in appearance of skin Neurologic: No dizziness, headache, syncope, seizures, numbness, tingling Psychiatric: No significant anxiety, depression, insomnia, anorexia Endocrine: No change in hair/skin/nails, excessive thirst, excessive hunger, excessive urination  Hematologic/lymphatic: No significant bruising, lymphadenopathy, abnormal bleeding Allergy/immunology: No itchy/watery eyes, significant sneezing, urticaria, angioedema  Physical exam:  Pertinent or positive findings: He is essentially bald except for thin hair over the posterior crown.  Eyebrows are absent.  The maxilla is edentulous.  He has a few remaining mandibular teeth.  Breath sounds are decreased superiorly.  Abdomen is protuberant.  Pedal pulses are nonpalpable.  He has an antiwandering bracelet over the right ankle.  Interosseous wasting is noted.  Deep tendon reflexes are 0-1/2+ in the upper extremities.  General appearance: Adequately nourished; no acute distress, increased work of breathing is present.   Lymphatic: No lymphadenopathy about the head, neck, axilla. Eyes: No conjunctival inflammation or lid edema is present. There is no scleral icterus. Ears:  External ear exam shows no significant lesions or deformities.   Nose:  External nasal examination shows no deformity or inflammation. Nasal mucosa are pink and moist without lesions, exudates Neck:  No thyromegaly, masses, tenderness noted.    Heart:  Normal rate and regular rhythm. S1 and  S2 normal without gallop, murmur, click, rub .  Lungs:  without wheezes, rhonchi, rales,  rubs. Abdomen: Bowel sounds are normal. Abdomen is soft and nontender with no organomegaly, hernias, masses. GU: Deferred  Extremities:  No cyanosis, clubbing, edema  Neurologic exam :Balance, Rhomberg, finger to nose testing could not be completed due to clinical state Skin: Warm & dry w/o tenting. No significant lesions or rash.  See summary under each active problem in the Problem List with associated updated therapeutic plan

## 2022-05-23 NOTE — Assessment & Plan Note (Signed)
Total protein has improved to 6.4 but is still low.  Albumin remains low normal at 3.7.  Nutritionist to continue to follow at [facility].

## 2022-05-23 NOTE — Patient Instructions (Signed)
See assessment and plan under each diagnosis in the problem list and acutely for this visit 

## 2022-05-24 DIAGNOSIS — F0390 Unspecified dementia without behavioral disturbance: Secondary | ICD-10-CM | POA: Diagnosis not present

## 2022-05-24 DIAGNOSIS — R262 Difficulty in walking, not elsewhere classified: Secondary | ICD-10-CM | POA: Diagnosis not present

## 2022-05-24 DIAGNOSIS — Z9181 History of falling: Secondary | ICD-10-CM | POA: Diagnosis not present

## 2022-05-24 DIAGNOSIS — M5136 Other intervertebral disc degeneration, lumbar region: Secondary | ICD-10-CM | POA: Diagnosis not present

## 2022-05-24 DIAGNOSIS — M6281 Muscle weakness (generalized): Secondary | ICD-10-CM | POA: Diagnosis not present

## 2022-05-24 DIAGNOSIS — R278 Other lack of coordination: Secondary | ICD-10-CM | POA: Diagnosis not present

## 2022-05-27 ENCOUNTER — Ambulatory Visit: Payer: Medicare Other | Admitting: Urology

## 2022-05-29 ENCOUNTER — Non-Acute Institutional Stay (SKILLED_NURSING_FACILITY): Payer: Medicare Other | Admitting: Adult Health

## 2022-05-29 ENCOUNTER — Other Ambulatory Visit (HOSPITAL_COMMUNITY)
Admission: RE | Admit: 2022-05-29 | Discharge: 2022-05-29 | Disposition: A | Discharge status: Home / Self Care | Payer: Medicare Other | Source: Skilled Nursing Facility | Attending: Adult Health | Admitting: Adult Health

## 2022-05-29 ENCOUNTER — Other Ambulatory Visit: Payer: Self-pay | Admitting: Adult Health

## 2022-05-29 DIAGNOSIS — I10 Essential (primary) hypertension: Secondary | ICD-10-CM | POA: Diagnosis not present

## 2022-05-29 DIAGNOSIS — R569 Unspecified convulsions: Secondary | ICD-10-CM

## 2022-05-29 DIAGNOSIS — R55 Syncope and collapse: Secondary | ICD-10-CM | POA: Insufficient documentation

## 2022-05-29 LAB — CBC WITH DIFFERENTIAL/PLATELET
Abs Immature Granulocytes: 0.03 10*3/uL (ref 0.00–0.07)
Basophils Absolute: 0 10*3/uL (ref 0.0–0.1)
Basophils Relative: 0 %
Eosinophils Absolute: 0 10*3/uL (ref 0.0–0.5)
Eosinophils Relative: 0 %
HCT: 42.8 % (ref 39.0–52.0)
Hemoglobin: 13.8 g/dL (ref 13.0–17.0)
Immature Granulocytes: 0 %
Lymphocytes Relative: 25 %
Lymphs Abs: 1.7 10*3/uL (ref 0.7–4.0)
MCH: 27.5 pg (ref 26.0–34.0)
MCHC: 32.2 g/dL (ref 30.0–36.0)
MCV: 85.4 fL (ref 80.0–100.0)
Monocytes Absolute: 0.4 10*3/uL (ref 0.1–1.0)
Monocytes Relative: 6 %
Neutro Abs: 4.6 10*3/uL (ref 1.7–7.7)
Neutrophils Relative %: 69 %
Platelets: 205 10*3/uL (ref 150–400)
RBC: 5.01 MIL/uL (ref 4.22–5.81)
RDW: 14.3 % (ref 11.5–15.5)
WBC: 6.7 10*3/uL (ref 4.0–10.5)
nRBC: 0 % (ref 0.0–0.2)

## 2022-05-29 LAB — LACTIC ACID, PLASMA: Lactic Acid, Venous: 1.6 mmol/L (ref 0.5–1.9)

## 2022-05-29 LAB — BASIC METABOLIC PANEL
Anion gap: 9 (ref 5–15)
BUN: 18 mg/dL (ref 8–23)
CO2: 25 mmol/L (ref 22–32)
Calcium: 9.5 mg/dL (ref 8.9–10.3)
Chloride: 107 mmol/L (ref 98–111)
Creatinine, Ser: 1.09 mg/dL (ref 0.61–1.24)
GFR, Estimated: >60 mL/min (ref >60)
Glucose, Bld: 142 mg/dL — ABNORMAL HIGH (ref 70–99)
Potassium: 3.5 mmol/L (ref 3.5–5.1)
Sodium: 141 mmol/L (ref 135–145)

## 2022-05-29 LAB — TROPONIN I (HIGH SENSITIVITY)
Troponin I (High Sensitivity): 12 ng/L (ref <18)
Troponin I (High Sensitivity): 14 ng/L (ref <18)

## 2022-05-30 ENCOUNTER — Other Ambulatory Visit (HOSPITAL_COMMUNITY)
Admission: RE | Admit: 2022-05-30 | Discharge: 2022-05-30 | Disposition: A | Discharge status: Home / Self Care | Payer: Medicare Other | Source: Skilled Nursing Facility | Attending: Adult Health | Admitting: Adult Health

## 2022-05-30 ENCOUNTER — Ambulatory Visit (HOSPITAL_COMMUNITY): Payer: Medicare Other

## 2022-05-30 DIAGNOSIS — R55 Syncope and collapse: Secondary | ICD-10-CM | POA: Diagnosis not present

## 2022-05-30 DIAGNOSIS — I10 Essential (primary) hypertension: Secondary | ICD-10-CM | POA: Insufficient documentation

## 2022-05-30 LAB — TROPONIN I (HIGH SENSITIVITY): Troponin I (High Sensitivity): 14 ng/L (ref <18)

## 2022-06-04 ENCOUNTER — Ambulatory Visit (HOSPITAL_COMMUNITY): Payer: Medicare Other

## 2022-06-05 ENCOUNTER — Encounter: Payer: Self-pay | Admitting: Adult Health

## 2022-06-05 DIAGNOSIS — R569 Unspecified convulsions: Secondary | ICD-10-CM | POA: Insufficient documentation

## 2022-06-05 NOTE — Progress Notes (Signed)
Location:  [facility] Room Number: 156 Place of Service:  SNF (31)   CODE STATUS: dnr   Allergies  Allergen Reactions   Hctz [Hydrochlorothiazide]     hypokalemia   Xanax [Alprazolam] Other (See Comments)    Causes hallucinations     Chief Complaint  Patient presents with   Acute Visit    New onset seizure     HPI:  This AM his roommate witnessed him shaking on the right arm. He was unresponsive to verbal stimuli. The episode lasted less than one minute. He is more disoriented and unable to answer questions. He has been incontinent of urine.   Past Medical History:  Diagnosis Date   Anxiety    Cancer [facility])    prostate   Chronic chest wall pain    Closed displaced fracture of right femoral neck ([place]) 03/09/2020   9/30-10/10/2019 minimally displaced right femoral neck fracture sustained in a mechanical fall  Cannulated right hip pinning by Dr. Arther Abbott 10/1  Full dose aspirin prophylaxis through 11/5 with subsequent dosage of 81 mg daily.   Essential hypertension, benign 06/20/2016   GAD (generalized anxiety disorder)    GERD (gastroesophageal reflux disease)    H/O echocardiogram 2016   normal   Heart murmur    "leaking heart valve"   High cholesterol 06/20/2016   HOH (hard of hearing)    left   Hx of falling    Interstitial pulmonary disease (HCC)    Lower GI bleed    MDD (major depressive disorder)    Murmur, cardiac    Poor historian    Prostate cancer ([place])    PUD (peptic ulcer disease)    RLS (restless legs syndrome)    Shortness of breath    Wrist fracture 12/16/2011    Past Surgical History:  Procedure Laterality Date   CHOLECYSTECTOMY N/A 06/28/2020   Procedure: LAPAROSCOPIC CHOLECYSTECTOMY;  Surgeon: [name], MD;  Location: [facility] ORS;  Service: General;  Laterality: N/A;  pt in [place]  2005   Rehman: Sigmoid colon diverticulosis, moderate sized external hemorrhoids. A few focal areas of  mucosal erythema felt to be nonspecific.   ESOPHAGOGASTRODUODENOSCOPY     ESOPHAGOGASTRODUODENOSCOPY N/A 07/18/2016   Rehman: normal exam except duodenal scar. h.pylori serologies negative   FLEXIBLE SIGMOIDOSCOPY N/A 06/11/2020   Procedure: FLEXIBLE SIGMOIDOSCOPY;  Surgeon: Eloise Harman, DO;  Location: [facility] ENDO SUITE;  Service: Endoscopy;  Laterality: N/A;   HARDWARE REMOVAL Right 03/20/2021   Procedure: HARDWARE REMOVAL right hip;  Surgeon: Carole Civil, MD;  Location: [facility] ORS;  Service: Orthopedics;  Laterality: Right;   HIP PINNING,CANNULATED Right 03/10/2020   Procedure: INTERNAL FIXATION RIGHT HIP;  Surgeon: Carole Civil, MD;  Location: [facility] ORS;  Service: Orthopedics;  Laterality: Right;   ORIF WRIST FRACTURE  12/19/2011   Procedure: OPEN REDUCTION INTERNAL FIXATION (ORIF) WRIST FRACTURE;  Surgeon: Carole Civil, MD;  Location: [facility] ORS;  Service: Orthopedics;  Laterality: Right;   prostate cancer     Diagnosed in the 90s   TONSILLECTOMY      Social History   Socioeconomic History   Marital status: Widowed    Spouse name: Not on file   Number of children: Not on file   Years of education: Not on file   Highest education level: Not on file  Occupational History   Occupation: retired    Fish farm manager: RETIRED  Tobacco Use   Smoking status:  Never   Smokeless tobacco: Never  Vaping Use   Vaping Use: Never used  Substance and Sexual Activity   Alcohol use: No   Drug use: No   Sexual activity: Not Currently  Other Topics Concern   Not on file  Social History Narrative   Is long term resident of [facility]    Social Determinants of Health   Financial Resource Strain: Not on file  Food Insecurity: Not on file  Transportation Needs: Not on file  Physical Activity: Not on file  Stress: Not on file  Social Connections: Not on file  Intimate Partner Violence: Not on file   Family History  Problem Relation Age of Onset   Cancer Other    Heart attack Mother    Cancer  Brother    Heart attack Brother    Heart disease Sister        by pass      VITAL SIGNS BP (!) 170/94   Pulse 72   Temp (!) 97.4 F (36.3 C)   Resp 20   Ht 5' 9"  (1.753 m)   Wt 166 lb 12.8 oz (75.7 kg)   SpO2 97%   BMI 24.63 kg/m   Outpatient Encounter Medications as of 05/29/2022  Medication Sig   acetaminophen (TYLENOL) 500 MG tablet Take 500 mg by mouth every 6 (six) hours. Max 3033m in 24 hour period   alfuzosin (UROXATRAL) 10 MG 24 hr tablet Take 10 mg by mouth daily. 6 pm   Ascorbic Acid (VITAMIN C) 1000 MG tablet Take 1,000 mg by mouth daily.   calcium carbonate (TUMS - DOSED IN MG ELEMENTAL CALCIUM) 500 MG chewable tablet Chew 1 tablet by mouth daily.   Cholecalciferol (D3-1000) 25 MCG (1000 UT) tablet Take 1,000 Units by mouth daily.   dilTIAZem HCl ER 300 MG TB24 Take 1 capsule by mouth daily.   Flax Oil-Fish Oil-Borage Oil (FISH OIL-FLAX OIL-BORAGE OIL) CAPS Take 1 capsule by mouth daily.   hydrALAZINE (APRESOLINE) 10 MG tablet Take 10 mg by mouth 3 (three) times daily. Hypertensive chronic kidney disease with stage 1 through stage 4 chronic kidney disease, or unspecified chronic kidney disease   isosorbide mononitrate (IMDUR) 60 MG 24 hr tablet Take 60 mg by mouth daily.   lisinopril (ZESTRIL) 40 MG tablet Take 40 mg by mouth daily. Hypertensive chronic kidney disease with stage 1 through stage 4 chronic kidney disease, or unspecified chronic kidney disease   loratadine (CLARITIN) 10 MG tablet Take 10 mg by mouth daily.   Melatonin 10 MG TABS Take 10 mg by mouth at bedtime.   memantine (NAMENDA) 10 MG tablet Take 10 mg by mouth 2 (two) times daily.   methocarbamol (ROBAXIN) 500 MG tablet Take 500 mg by mouth daily.   metoprolol tartrate (LOPRESSOR) 25 MG tablet Take 1 tablet (25 mg total) by mouth 2 (two) times daily.   omeprazole (PRILOSEC) 20 MG capsule Take 1 capsule (20 mg total) by mouth daily before breakfast.   OXYGEN Inhale 2 L into the lungs as needed (To  keep O2 ststa > 90 %).   polyethylene glycol (MIRALAX / GLYCOLAX) 17 g packet Take 17 g by mouth at bedtime.   potassium chloride SA (KLOR-CON) 20 MEQ tablet Take 20 mEq by mouth 2 (two) times daily.   QUEtiapine (SEROQUEL) 25 MG tablet Take 25 mg by mouth 2 (two) times daily.   rOPINIRole (REQUIP) 0.25 MG tablet Take 0.25 mg by mouth at bedtime.   saline (AYR)  GEL Place 1 Application into both nostrils every 6 (six) hours as needed.   UNABLE TO FIND Diet:Regular   venlafaxine XR (EFFEXOR-XR) 150 MG 24 hr capsule Take 150 mg by mouth daily with breakfast.   Vibegron (GEMTESA) 75 MG TABS Take 1 tablet by mouth daily as needed.   vitamin B-12 (CYANOCOBALAMIN) 1000 MCG tablet Take 1,000 mcg by mouth daily. for vitamin B 12 level 205   No facility-administered encounter medications on file as of 05/29/2022.     SIGNIFICANT DIAGNOSTIC EXAMS  PREVIOUS   09-10-21: chest x-ray: patch asymmetric inflammatory changes. These could represent areas of atypical infection; focal atelectasis infarction or evolving consolidation among other possibilities.  11-15-21: scrotal ultrasounds No evidence of intratesticular   mass or torsion bilateral  No evidence of varicoceles bilateral  Small sized right hydrocele Bilateral epididymal head is enlarges may represent epididymitis   01-23-22: dexa: t score -1.331  NO NEW LABS.    LABS REVIEWED PREVIOUS      08-10-21: PSA 0.17 testosterone <3 08-16-21: wbc 6.0; hgb 12.0; hct 37.1; mcv 89.0 plt 181; glucose 98; bun 40; creat 1.46; k+ 5.7; na++ 138; ca 9.3 GFR>46 protein 5.8; albumin 3.4; tsh 0.620 08-24-21: glucose 104; bun 46; creat 2.07; k+ 4.7; na++ 141; ca 8.5; GFR 30  08-30-21: vitamin B12  205  09-10-21: wbc 8.5; hgb 12.8; hct 38.5; mcv 87.3; plt 167; glucose 131; bun 25; creat 1.20; k+ 3.9; na++ 140; ca 9.6; GFR 58; iron 38; tibc 261; folate 14.2; CRP 6.7; d-dimer 0.43 09-17-21: CRP <0.5 10-18-21: wbc 6.1; hgb 13.3; hct 41.5; mcv 89.7 plt 190; glucose 91; bun  25; creat 0.95; k+ 3.4; na++ 142; ca 9.3; gfr >60; protein 6.0; albumin 3.5 01-31-22: glucose 100; bun 20; creat 0.94; k+ 3.5; na++ 143; ca 9.5 gfr >60 02-11-22: glucose 102; bun 19; creat 0.91; k+ 3.3; na++ 141; ca 9.3; gfr >60  04-01-22: wbc 6.9; hgb 13.3; hct 41.0; mcv 86.9 plt 174; glucose 115; bun 37; creat 1.07; k+ 4.1; na++ 144; ca 9.7; gfr >60; protein 6.0; albumin 3.5; vitamin B12: 856; tsh 0.157  NO NEW LABS.     Review of Systems  Unable to perform ROS: Medical condition    Physical Exam Constitutional:      General: He is not in acute distress.    Appearance: He is well-developed. He is not diaphoretic.  Neck:     Thyroid: No thyromegaly.  Cardiovascular:     Rate and Rhythm: Normal rate and regular rhythm.     Pulses: Normal pulses.     Heart sounds: Normal heart sounds.  Pulmonary:     Effort: Pulmonary effort is normal. No respiratory distress.     Breath sounds: Normal breath sounds.  Abdominal:     General: Bowel sounds are normal. There is no distension.     Palpations: Abdomen is soft.     Tenderness: There is no abdominal tenderness.  Genitourinary:    Comments: Has been incontinent of urine  Musculoskeletal:        General: Normal range of motion.     Cervical back: Neck supple.     Right lower leg: No edema.     Left lower leg: No edema.  Lymphadenopathy:     Cervical: No cervical adenopathy.  Skin:    General: Skin is warm and dry.  Neurological:     Mental Status: He is alert.     Comments: Is disoriented   Psychiatric:  Mood and Affect: Mood normal.       ASSESSMENT/ PLAN:  TODAY   New onset seizure: will setup neurology consult and EEG;EKG; cbc; bmp tropinin every 8 hours X 3.  will begin keppra 500 mg twice daily and will monitor     Ok Edwards NP [facility] Adult Medicine   call [phone]

## 2022-06-17 ENCOUNTER — Non-Acute Institutional Stay: Payer: Self-pay | Admitting: Family Medicine

## 2022-06-17 DIAGNOSIS — N3281 Overactive bladder: Secondary | ICD-10-CM

## 2022-06-17 DIAGNOSIS — F0390 Unspecified dementia without behavioral disturbance: Secondary | ICD-10-CM

## 2022-06-17 DIAGNOSIS — I10 Essential (primary) hypertension: Secondary | ICD-10-CM

## 2022-06-17 DIAGNOSIS — R569 Unspecified convulsions: Secondary | ICD-10-CM

## 2022-06-17 NOTE — Progress Notes (Signed)
Location:  [place] of Service:   [facility]   CODE STATUS: DNR   Allergies  Allergen Reactions   Hctz [Hydrochlorothiazide]     hypokalemia   Xanax [Alprazolam] Other (See Comments)    Causes hallucinations     HPI: NAS WAFER was visited by the following members of the [place] [facility]: Dr. Sharion Settler (PGY-3), Dr. Pearla Dubonnet (PGY-2), and Dr. Sherren Mocha McDiarmid (Faculty). He is in good spirits though minimally talkative today, responding to questions in just 1-2 words.   Since our team last visited with Mr. Vanatta on 11/6 he has been noted to have recurrent falls (6 total since then). He was referred back to PT/OT on 11/13 (evaluated and treatment began 11/17) but discharged after roughly a month due to reaching maximum potential.   He had a brief period of unresponsiveness on 12/20 that self-subsided. This was thought to be due to new onset seizure. Neurology consult placed, EEG ordered, and he was started on Keppra 500 mg BID. Reviewed lab work (CBC, BMP, lactic acid, Trop x3) which were all reassuring.   Additionally, there was questionable chest pain on 12/29 but no further workup obtained as patient denied any chest pain upon further discussion. Mr. Kisling states that he intermittently has brief episodes of chest pain that subside and are not associated with N/V, SOB, nor do they radiate.   Frequent Falls: Has been discharged from PT and OT since he has reached maximum therapeutic potential. He does not recall falling. He states that he does not normally walk. He was not interested in trial of getting out of wheelchair today.   Depression and Dementia with Behavioral Disturbance: Has been tapered off of Seroquel. He reports no concerns today.   Urinary incontinence: currently on Alfuzosin 10 mg and Vibegron (gemtesa) 75 mg. He reports no problems with urinary incontinence today.    Past Medical History:  Diagnosis  Date   Anxiety    Cancer [facility])    prostate   Chronic chest wall pain    Closed displaced fracture of right femoral neck ([place]) 03/09/2020   9/30-10/10/2019 minimally displaced right femoral neck fracture sustained in a mechanical fall  Cannulated right hip pinning by Dr. Arther Abbott 10/1  Full dose aspirin prophylaxis through 11/5 with subsequent dosage of 81 mg daily.   Essential hypertension, benign 06/20/2016   GAD (generalized anxiety disorder)    GERD (gastroesophageal reflux disease)    H/O echocardiogram 2016   normal   Heart murmur    "leaking heart valve"   High cholesterol 06/20/2016   HOH (hard of hearing)    left   Hx of falling    Interstitial pulmonary disease (HCC)    Lower GI bleed    MDD (major depressive disorder)    Murmur, cardiac    Poor historian    Prostate cancer ([place])    PUD (peptic ulcer disease)    RLS (restless legs syndrome)    Shortness of breath    Wrist fracture 12/16/2011    Past Surgical History:  Procedure Laterality Date   CHOLECYSTECTOMY N/A 06/28/2020   Procedure: LAPAROSCOPIC CHOLECYSTECTOMY;  Surgeon: [name], MD;  Location: [facility] ORS;  Service: General;  Laterality: N/A;  pt in [place]  2005   Rehman: Sigmoid colon diverticulosis, moderate sized external hemorrhoids. A few focal areas of mucosal erythema felt to be nonspecific.   ESOPHAGOGASTRODUODENOSCOPY  ESOPHAGOGASTRODUODENOSCOPY N/A 07/18/2016   Rehman: normal exam except duodenal scar. h.pylori serologies negative   FLEXIBLE SIGMOIDOSCOPY N/A 06/11/2020   Procedure: FLEXIBLE SIGMOIDOSCOPY;  Surgeon: Eloise Harman, DO;  Location: [facility] ENDO SUITE;  Service: Endoscopy;  Laterality: N/A;   HARDWARE REMOVAL Right 03/20/2021   Procedure: HARDWARE REMOVAL right hip;  Surgeon: Carole Civil, MD;  Location: [facility] ORS;  Service: Orthopedics;  Laterality: Right;   HIP PINNING,CANNULATED Right 03/10/2020   Procedure: INTERNAL FIXATION RIGHT HIP;  Surgeon:  Carole Civil, MD;  Location: [facility] ORS;  Service: Orthopedics;  Laterality: Right;   ORIF WRIST FRACTURE  12/19/2011   Procedure: OPEN REDUCTION INTERNAL FIXATION (ORIF) WRIST FRACTURE;  Surgeon: Carole Civil, MD;  Location: [facility] ORS;  Service: Orthopedics;  Laterality: Right;   prostate cancer     Diagnosed in the 90s   TONSILLECTOMY      Social History   Socioeconomic History   Marital status: Widowed    Spouse name: Not on file   Number of children: Not on file   Years of education: Not on file   Highest education level: Not on file  Occupational History   Occupation: retired    Fish farm manager: RETIRED  Tobacco Use   Smoking status: Never   Smokeless tobacco: Never  Vaping Use   Vaping Use: Never used  Substance and Sexual Activity   Alcohol use: No   Drug use: No   Sexual activity: Not Currently  Other Topics Concern   Not on file  Social History Narrative   Is long term resident of [facility]    Social Determinants of Health   Financial Resource Strain: Not on file  Food Insecurity: Not on file  Transportation Needs: Not on file  Physical Activity: Not on file  Stress: Not on file  Social Connections: Not on file  Intimate Partner Violence: Not on file   Family History  Problem Relation Age of Onset   Cancer Other    Heart attack Mother    Cancer Brother    Heart attack Brother    Heart disease Sister        by pass   VITAL SIGNS There were no vitals taken for this visit.  Outpatient Encounter Medications as of 06/17/2022  Medication Sig   acetaminophen (TYLENOL) 500 MG tablet Take 500 mg by mouth every 6 (six) hours. Max '3000mg'$  in 24 hour period   alfuzosin (UROXATRAL) 10 MG 24 hr tablet Take 10 mg by mouth daily. 6 pm   Ascorbic Acid (VITAMIN C) 1000 MG tablet Take 1,000 mg by mouth daily.   calcium carbonate (TUMS - DOSED IN MG ELEMENTAL CALCIUM) 500 MG chewable tablet Chew 1 tablet by mouth daily.   Cholecalciferol (D3-1000) 25 MCG (1000 UT) tablet Take  1,000 Units by mouth daily.   dilTIAZem HCl ER 300 MG TB24 Take 1 capsule by mouth daily.   Flax Oil-Fish Oil-Borage Oil (FISH OIL-FLAX OIL-BORAGE OIL) CAPS Take 1 capsule by mouth daily.   hydrALAZINE (APRESOLINE) 10 MG tablet Take 10 mg by mouth 3 (three) times daily. Hypertensive chronic kidney disease with stage 1 through stage 4 chronic kidney disease, or unspecified chronic kidney disease   isosorbide mononitrate (IMDUR) 60 MG 24 hr tablet Take 60 mg by mouth daily.   levETIRAcetam (KEPPRA) 500 MG tablet Take 500 mg by mouth 2 (two) times daily.   lisinopril (ZESTRIL) 40 MG tablet Take 40 mg by mouth daily. Hypertensive chronic kidney disease with stage 1  through stage 4 chronic kidney disease, or unspecified chronic kidney disease   loratadine (CLARITIN) 10 MG tablet Take 10 mg by mouth daily.   Melatonin 10 MG TABS Take 10 mg by mouth at bedtime.   memantine (NAMENDA) 10 MG tablet Take 10 mg by mouth 2 (two) times daily.   methocarbamol (ROBAXIN) 500 MG tablet Take 500 mg by mouth daily.   metoprolol tartrate (LOPRESSOR) 25 MG tablet Take 1 tablet (25 mg total) by mouth 2 (two) times daily.   omeprazole (PRILOSEC) 20 MG capsule Take 1 capsule (20 mg total) by mouth daily before breakfast.   OXYGEN Inhale 2 L into the lungs as needed (To keep O2 ststa > 90 %).   polyethylene glycol (MIRALAX / GLYCOLAX) 17 g packet Take 17 g by mouth at bedtime.   potassium chloride SA (KLOR-CON) 20 MEQ tablet Take 20 mEq by mouth 2 (two) times daily.   QUEtiapine (SEROQUEL) 25 MG tablet Take 25 mg by mouth 2 (two) times daily.   rOPINIRole (REQUIP) 0.25 MG tablet Take 0.25 mg by mouth at bedtime.   saline (AYR) GEL Place 1 Application into both nostrils every 6 (six) hours as needed.   UNABLE TO FIND Diet:Regular   venlafaxine XR (EFFEXOR-XR) 150 MG 24 hr capsule Take 150 mg by mouth daily with breakfast.   Vibegron (GEMTESA) 75 MG TABS Take 1 tablet by mouth daily as needed.   vitamin B-12  (CYANOCOBALAMIN) 1000 MCG tablet Take 1,000 mcg by mouth daily. for vitamin B 12 level 205   No facility-administered encounter medications on file as of 06/17/2022.     SIGNIFICANT DIAGNOSTIC EXAMS General: Awake, alert, oriented to name and place but not time or situation, in no acute distress HEENT: Slight seborrheic dermatitis, actinic keratosis on head and scalp  Cardio: RRR  Respiratory: CTAB without wheezing/rhonchi/rales Abdomen: Soft, non-tender to palpation of all quadrants, non-distended MSK: Sitting in wheelchair. Able to move arms and legs but was not interested in trying to get out of wheelchair.  Extremities: without edema Neuro: Speech is clear and intact, no focal deficits, no facial asymmetry, responds to questions with 1-2 word phrases. No cogwheel rigidity  Psych: Normal mood and flat affect but smiles intermittently   ASSESSMENT/ PLAN:  Frequent Falls: Has reached maximum potential with PT/OT. Try to mitigate fall risk as able. Unable to assess for balance/instability or strength much today as patient was not wanting to participate in this part of examination. Medications reviewed, consider transitioning from methocarbamol to tizanidine or baclofen as needed.   Urinary Incontinence: Denies any problems with urinary incontinence today. Continue alfuzosin and vibegron.   New onset Seizure: Neurology appointment scheduled for 2/13. EEG was ordered but does not appear to be scheduled. Upon review of notes, he has not seemed to have any recurrence of seizure-like activity. Continue Keppra 500 BID for now.   Hypertension: Continue current management. BP reviewed and appear to mostly range 010-932 systolic, 35-57 diastolic. Would want to avoid hypotension due to his high fall risk.   Chest Pain: Intermittent and short lasting episodes. Consider GERD vs cardiac etiology. No pain today. Monitor and further workup pending recurrence/severity. No recent EKGs performed to review.    Thank you for allowing [place] to participate in Mr. Mikelson care.   Sharion Settler, DO  Family Medicine Resident, PGY-3   [facility] Adult Medicine  Contact [phone] Monday through Friday 8am- 5pm  After hours call [phone]

## 2022-06-19 ENCOUNTER — Encounter: Payer: Self-pay | Admitting: Adult Health

## 2022-06-19 ENCOUNTER — Non-Acute Institutional Stay (SKILLED_NURSING_FACILITY): Payer: Medicare Other | Admitting: Adult Health

## 2022-06-19 ENCOUNTER — Ambulatory Visit: Payer: Medicare Other | Admitting: Neurology

## 2022-06-19 DIAGNOSIS — C61 Malignant neoplasm of prostate: Secondary | ICD-10-CM

## 2022-06-19 DIAGNOSIS — N183 Chronic kidney disease, stage 3 unspecified: Secondary | ICD-10-CM

## 2022-06-19 DIAGNOSIS — K219 Gastro-esophageal reflux disease without esophagitis: Secondary | ICD-10-CM

## 2022-06-19 DIAGNOSIS — I129 Hypertensive chronic kidney disease with stage 1 through stage 4 chronic kidney disease, or unspecified chronic kidney disease: Secondary | ICD-10-CM | POA: Diagnosis not present

## 2022-06-19 DIAGNOSIS — N3281 Overactive bladder: Secondary | ICD-10-CM

## 2022-06-19 NOTE — Progress Notes (Signed)
Location:  [place] Room Number: 156 D Place of Service:  SNF (31)   CODE STATUS: DNR  Allergies  Allergen Reactions   Hctz [Hydrochlorothiazide]     hypokalemia   Xanax [Alprazolam] Other (See Comments)    Causes hallucinations     Chief Complaint  Patient presents with   Medical Management of Chronic Issues                     Benign hypertension with chronic kidney disease stage III: Gastroesophageal reflux disease without esophagitis:  Prostate cancer/over active bladder:  Seizure:    HPI:  He is a 89 year old long term resident of this facility being seen for the management of his chronic illnesses:Benign hypertension with chronic kidney disease stage III: Gastroesophageal reflux disease without esophagitis:  Prostate cancer/over active bladder:  Seizure. There have been no reports of further seizure activity; neurology appointment is pending. He has lost weight in the past month. There are no reports of uncontrolled pain.    Past Medical History:  Diagnosis Date   Anxiety    Cancer [facility])    prostate   Chronic chest wall pain    Closed displaced fracture of right femoral neck ([place]) 03/09/2020   9/30-10/10/2019 minimally displaced right femoral neck fracture sustained in a mechanical fall  Cannulated right hip pinning by Dr. Arther Abbott 10/1  Full dose aspirin prophylaxis through 11/5 with subsequent dosage of 81 mg daily.   Essential hypertension, benign 06/20/2016   GAD (generalized anxiety disorder)    GERD (gastroesophageal reflux disease)    H/O echocardiogram 2016   normal   Heart murmur    "leaking heart valve"   High cholesterol 06/20/2016   HOH (hard of hearing)    left   Hx of falling    Interstitial pulmonary disease (HCC)    Lower GI bleed    MDD (major depressive disorder)    Murmur, cardiac    Poor historian    Prostate cancer ([place])    PUD (peptic ulcer disease)    RLS (restless legs syndrome)    Shortness of breath     Wrist fracture 12/16/2011    Past Surgical History:  Procedure Laterality Date   CHOLECYSTECTOMY N/A 06/28/2020   Procedure: LAPAROSCOPIC CHOLECYSTECTOMY;  Surgeon: [name], MD;  Location: [facility] ORS;  Service: General;  Laterality: N/A;  pt in [place]  2005   Rehman: Sigmoid colon diverticulosis, moderate sized external hemorrhoids. A few focal areas of mucosal erythema felt to be nonspecific.   ESOPHAGOGASTRODUODENOSCOPY     ESOPHAGOGASTRODUODENOSCOPY N/A 07/18/2016   Rehman: normal exam except duodenal scar. h.pylori serologies negative   FLEXIBLE SIGMOIDOSCOPY N/A 06/11/2020   Procedure: FLEXIBLE SIGMOIDOSCOPY;  Surgeon: Eloise Harman, DO;  Location: [facility] ENDO SUITE;  Service: Endoscopy;  Laterality: N/A;   HARDWARE REMOVAL Right 03/20/2021   Procedure: HARDWARE REMOVAL right hip;  Surgeon: Carole Civil, MD;  Location: [facility] ORS;  Service: Orthopedics;  Laterality: Right;   HIP PINNING,CANNULATED Right 03/10/2020   Procedure: INTERNAL FIXATION RIGHT HIP;  Surgeon: Carole Civil, MD;  Location: [facility] ORS;  Service: Orthopedics;  Laterality: Right;   ORIF WRIST FRACTURE  12/19/2011   Procedure: OPEN REDUCTION INTERNAL FIXATION (ORIF) WRIST FRACTURE;  Surgeon: Carole Civil, MD;  Location: [facility] ORS;  Service: Orthopedics;  Laterality: Right;   prostate cancer     Diagnosed in the 90s   TONSILLECTOMY  Social History   Socioeconomic History   Marital status: Widowed    Spouse name: Not on file   Number of children: Not on file   Years of education: Not on file   Highest education level: Not on file  Occupational History   Occupation: retired    Fish farm manager: RETIRED  Tobacco Use   Smoking status: Never   Smokeless tobacco: Never  Vaping Use   Vaping Use: Never used  Substance and Sexual Activity   Alcohol use: No   Drug use: No   Sexual activity: Not Currently  Other Topics Concern   Not on file  Social History Narrative   Is long term  resident of [facility]    Social Determinants of Health   Financial Resource Strain: Not on file  Food Insecurity: Not on file  Transportation Needs: Not on file  Physical Activity: Not on file  Stress: Not on file  Social Connections: Not on file  Intimate Partner Violence: Not on file   Family History  Problem Relation Age of Onset   Cancer Other    Heart attack Mother    Cancer Brother    Heart attack Brother    Heart disease Sister        by pass      VITAL SIGNS BP (!) 151/81 Comment: Vitals recorded fromMatrix, [facility] EMR system  Pulse (!) 59 Comment: Vitals recoded from [facility] system, [employer]  Temp (!) 97.1 F (36.2 C)   Resp (!) 21 Comment: Vitals recoded from [facility]'s EMR system, [employer]  Ht '5\' 9"'$  (1.753 m)   Wt 166 lb (75.3 kg)   BMI 24.51 kg/m   Outpatient Encounter Medications as of 06/19/2022  Medication Sig   acetaminophen (TYLENOL) 500 MG tablet Take 500 mg by mouth every 6 (six) hours. Max '3000mg'$  in 24 hour period   alfuzosin (UROXATRAL) 10 MG 24 hr tablet Take 10 mg by mouth daily. 6 pm   calcium carbonate (OSCAL) 1500 (600 Ca) MG TABS tablet Take 1,500 mg by mouth daily with breakfast.   Cholecalciferol (D3-1000) 25 MCG (1000 UT) tablet Take 1,000 Units by mouth daily.   dilTIAZem HCl ER 300 MG TB24 Take 1 capsule by mouth daily.   hydrALAZINE (APRESOLINE) 10 MG tablet Take 10 mg by mouth 3 (three) times daily. Hypertensive chronic kidney disease with stage 1 through stage 4 chronic kidney disease, or unspecified chronic kidney disease   isosorbide mononitrate (IMDUR) 60 MG 24 hr tablet Take 60 mg by mouth daily.   levETIRAcetam (KEPPRA) 500 MG tablet Take 500 mg by mouth 2 (two) times daily.   lidocaine 4 % Place 1 patch onto the skin daily. Apply to left hip for chronic pain   lisinopril (ZESTRIL) 40 MG tablet Take 40 mg by mouth daily. Hypertensive chronic kidney disease with stage 1 through stage 4 chronic kidney  disease, or unspecified chronic kidney disease   loratadine (CLARITIN) 10 MG tablet Take 10 mg by mouth daily.   Melatonin 10 MG TABS Take 10 mg by mouth at bedtime.   memantine (NAMENDA) 10 MG tablet Take 10 mg by mouth 2 (two) times daily.   methocarbamol (ROBAXIN) 500 MG tablet Take 500 mg by mouth daily.   metoprolol tartrate (LOPRESSOR) 25 MG tablet Take 1 tablet (25 mg total) by mouth 2 (two) times daily.   omeprazole (PRILOSEC) 20 MG capsule Take 1 capsule (20 mg total) by mouth daily before breakfast.  polyethylene glycol (MIRALAX / GLYCOLAX) 17 g packet Take 17 g by mouth at bedtime.   potassium chloride SA (KLOR-CON) 20 MEQ tablet Take 40 mEq by mouth 2 (two) times daily.   rOPINIRole (REQUIP) 0.25 MG tablet Take 0.25 mg by mouth at bedtime.   saline (AYR) GEL Place 1 Application into both nostrils every 6 (six) hours as needed.   UNABLE TO FIND Diet:Regular   venlafaxine XR (EFFEXOR-XR) 150 MG 24 hr capsule Take 150 mg by mouth daily with breakfast.   Vibegron (GEMTESA) 75 MG TABS Take 1 tablet by mouth daily as needed.   vitamin B-12 (CYANOCOBALAMIN) 1000 MCG tablet Take 1,000 mcg by mouth daily. for vitamin B 12 level 205   [DISCONTINUED] Ascorbic Acid (VITAMIN C) 1000 MG tablet Take 1,000 mg by mouth daily.   [DISCONTINUED] calcium carbonate (TUMS - DOSED IN MG ELEMENTAL CALCIUM) 500 MG chewable tablet Chew 1 tablet by mouth daily.   [DISCONTINUED] Flax Oil-Fish Oil-Borage Oil (FISH OIL-FLAX OIL-BORAGE OIL) CAPS Take 1 capsule by mouth daily.   [DISCONTINUED] OXYGEN Inhale 2 L into the lungs as needed (To keep O2 ststa > 90 %).   [DISCONTINUED] QUEtiapine (SEROQUEL) 25 MG tablet Take 25 mg by mouth 2 (two) times daily.   No facility-administered encounter medications on file as of 06/19/2022.     SIGNIFICANT DIAGNOSTIC EXAMS   PREVIOUS   09-10-21: chest x-ray: patch asymmetric inflammatory changes. These could represent areas of atypical infection; focal atelectasis  infarction or evolving consolidation among other possibilities.  11-15-21: scrotal ultrasounds No evidence of intratesticular   mass or torsion bilateral  No evidence of varicoceles bilateral  Small sized right hydrocele Bilateral epididymal head is enlarges may represent epididymitis   TODAY  01-23-22: dexa: t score -1.331   LABS REVIEWED PREVIOUS      08-10-21: PSA 0.17 testosterone <3 08-16-21: wbc 6.0; hgb 12.0; hct 37.1; mcv 89.0 plt 181; glucose 98; bun 40; creat 1.46; k+ 5.7; na++ 138; ca 9.3 GFR>46 protein 5.8; albumin 3.4; tsh 0.620 08-24-21: glucose 104; bun 46; creat 2.07; k+ 4.7; na++ 141; ca 8.5; GFR 30  08-30-21: vitamin B12  205  09-10-21: wbc 8.5; hgb 12.8; hct 38.5; mcv 87.3; plt 167; glucose 131; bun 25; creat 1.20; k+ 3.9; na++ 140; ca 9.6; GFR 58; iron 38; tibc 261; folate 14.2; CRP 6.7; d-dimer 0.43 09-17-21: CRP <0.5 10-18-21: wbc 6.1; hgb 13.3; hct 41.5; mcv 89.7 plt 190; glucose 91; bun 25; creat 0.95; k+ 3.4; na++ 142; ca 9.3; gfr >60; protein 6.0; albumin 3.5  TODAY  01-31-22: glucose 100; bun 20; creat 0.94; k+ 3.5; na++ 143; ca 9.5 gfr >60 02-11-22: glucose 102; bun 19; creat 0.91; k+ 3.3; na++ 141; ca 9.3; gfr >60 02-18-22: glucose 114; bun 27; creat 1.10; k+ 3.8; na++ 141; ca 9.3; gfr >60 04-01-22; wbc 6.9; hgb 13.3; hct 41.0; mcv 86.9 plt 174; glucose 115; bun 37; creat 1.07; k+ 4.1; na++ 144; ca 9.7; gfr >60; protein 6.0 albumin 3.5; tsh 0.157 vitamin B12; 856;  04-02-22: free t4: 0.80; RPR negative 04-05-22: wbc 6.6; hgb 12.8; hct 39.1; mcv 86.3 plt 196; glucose 140; bun 28; creat 1.14; k+ 4.2; na++ 142; ca 9.9; gfr >60; protein 6.4; albumin 3.7 05-29-22: wb 6.7; hgb 13.8; hct 42.8; mcv 85.4 plt 205; glucose 142; bun 18; creat 1.09; k+ 3.5; na++ 141; ca 9.5; gfr >60    Review of Systems  Constitutional:  Negative for malaise/fatigue.  Respiratory:  Negative for cough and  shortness of breath.   Cardiovascular:  Negative for chest pain, palpitations and leg swelling.   Gastrointestinal:  Negative for abdominal pain, constipation and heartburn.  Musculoskeletal:  Negative for back pain, joint pain and myalgias.  Skin: Negative.   Neurological:  Negative for dizziness.  Psychiatric/Behavioral:  The patient is not nervous/anxious.    Physical Exam Constitutional:      General: He is not in acute distress.    Appearance: He is well-developed. He is not diaphoretic.  Neck:     Thyroid: No thyromegaly.  Cardiovascular:     Rate and Rhythm: Normal rate and regular rhythm.     Pulses: Normal pulses.     Heart sounds: Normal heart sounds.  Pulmonary:     Effort: Pulmonary effort is normal. No respiratory distress.     Breath sounds: Normal breath sounds.  Abdominal:     General: Bowel sounds are normal. There is no distension.     Palpations: Abdomen is soft.     Tenderness: There is no abdominal tenderness.  Musculoskeletal:        General: Normal range of motion.     Cervical back: Neck supple.  Lymphadenopathy:     Cervical: No cervical adenopathy.  Skin:    General: Skin is warm and dry.  Neurological:     Mental Status: He is alert. Mental status is at baseline.  Psychiatric:        Mood and Affect: Mood normal.     ASSESSMENT/ PLAN:  TODAY  Benign hypertension with chronic kidney disease stage III: b/p 151/81: will continue lopressor 25 mg twice daily; cardizem er 300 mg daily lisinopril 40 mg daily   2. Gastroesophageal reflux disease without esophagitis: will continue prilosec 20 mg daily   3. Prostate cancer/over active bladder: will conitue uroxatral 10 mg daily gemtea 75 mg daily as needed: no oxybutynin due to history of hip fracture; off myebretiq due to hypertension   4. Seizure: new onset will continue keppra 500 mg twice daily    PREVIOUS   5. Hypokalemia k+ 3.5 will continue k+ 20 meq twice daily   6. Dyslipidemia: is stable is off zocor    7. Chronic constipation: will continue miralax three times weekly; and and  senna s as needed  8. CKD stage 3b; is stable bun 18; creat 1.09; gfr>60  9. Major depression recurrent chronic: will continue effexor xr 150 mg daily melatonin 10 mg nightly and is off seroquel    10. RLS: will continue requip 0.25 mg nightly   11. Iron deficiency anemia due to dietary intake: hgb 13.8; is off iron  12. Interstitial lung disease: claritin 10 mg daily   13. Vitamin B 12 deficiency: will monitor   14. Osteopenia: t score -1.331   15. Left hip pain: will continue mobic 7.5 mg daily robaxin 500 mg daily   16. Dementia without behavioral disturbance unspecified dementia type: weight is 166 pounds will continue namenda 10 mg twice daily   17. Aortic atherosclerosis (ct 06-09-20) is on asa 81 mg daily   18. Protein calorie malnutrition: albumin 3.7 will monitor    Ok Edwards NP [facility]   call [phone]

## 2022-06-24 ENCOUNTER — Other Ambulatory Visit (HOSPITAL_COMMUNITY)
Admission: RE | Admit: 2022-06-24 | Discharge: 2022-06-24 | Disposition: A | Discharge status: Home / Self Care | Payer: Medicare Other | Source: Skilled Nursing Facility | Attending: Adult Health | Admitting: Adult Health

## 2022-06-24 DIAGNOSIS — F0390 Unspecified dementia without behavioral disturbance: Secondary | ICD-10-CM | POA: Insufficient documentation

## 2022-06-24 LAB — HEMOGLOBIN AND HEMATOCRIT, BLOOD
HCT: 40.4 % (ref 39.0–52.0)
Hemoglobin: 13 g/dL (ref 13.0–17.0)

## 2022-06-24 LAB — T4, FREE: Free T4: 0.95 ng/dL (ref 0.61–1.12)

## 2022-06-24 LAB — TSH: TSH: 1.153 u[IU]/mL (ref 0.350–4.500)

## 2022-06-25 LAB — T3: T3, Total: 105 ng/dL (ref 71–180)

## 2022-07-05 ENCOUNTER — Non-Acute Institutional Stay (SKILLED_NURSING_FACILITY): Payer: Medicare Other | Admitting: Adult Health

## 2022-07-05 ENCOUNTER — Encounter: Payer: Self-pay | Admitting: Adult Health

## 2022-07-05 DIAGNOSIS — J849 Interstitial pulmonary disease, unspecified: Secondary | ICD-10-CM | POA: Diagnosis not present

## 2022-07-05 DIAGNOSIS — F0150 Vascular dementia without behavioral disturbance: Secondary | ICD-10-CM

## 2022-07-05 DIAGNOSIS — I700 Atherosclerosis of aorta: Secondary | ICD-10-CM | POA: Diagnosis not present

## 2022-07-05 HISTORY — DX: Vascular dementia, unspecified severity, without behavioral disturbance, psychotic disturbance, mood disturbance, and anxiety: F01.50

## 2022-07-05 NOTE — Progress Notes (Signed)
Location:  [place] Room Number: 156 D Place of Service:  SNF (31)   CODE STATUS: DNR  Allergies  Allergen Reactions   Hctz [Hydrochlorothiazide]     hypokalemia   Xanax [Alprazolam] Other (See Comments)    Causes hallucinations     Chief Complaint  Patient presents with   Acute Visit    Care plan meeting     HPI:  We have come together for his care plan meeting. *** BIMS 9/15 mood 12/30: indicates depression. He is nonambulatory with #8 falls he had a hematoma on side of head and 2 skin tears. He requires extensive assist to dependent with his adls. He is incontinent of bladder and bowel. Dietary: *** weight is 166 pounds on regular diet appetite 75-100%; requires setup for meals. Therapy: ***. Activities: ***. He continues to be followed for his chronic illnesses including:  Aortic atherosclerosis Interstitial lung disease  Vascular dementia without behavioral disturbance  Past Medical History:  Diagnosis Date   Anxiety    Cancer [facility])    prostate   Chronic chest wall pain    Closed displaced fracture of right femoral neck ([place]) 03/09/2020   9/30-10/10/2019 minimally displaced right femoral neck fracture sustained in a mechanical fall  Cannulated right hip pinning by Dr. Arther Abbott 10/1  Full dose aspirin prophylaxis through 11/5 with subsequent dosage of 81 mg daily.   Essential hypertension, benign 06/20/2016   GAD (generalized anxiety disorder)    GERD (gastroesophageal reflux disease)    H/O echocardiogram 2016   normal   Heart murmur    "leaking heart valve"   High cholesterol 06/20/2016   HOH (hard of hearing)    left   Hx of falling    Interstitial pulmonary disease (HCC)    Lower GI bleed    MDD (major depressive disorder)    Murmur, cardiac    Poor historian    Prostate cancer ([place])    PUD (peptic ulcer disease)    RLS (restless legs syndrome)    Shortness of breath    Wrist fracture 12/16/2011    Past Surgical History:   Procedure Laterality Date   CHOLECYSTECTOMY N/A 06/28/2020   Procedure: LAPAROSCOPIC CHOLECYSTECTOMY;  Surgeon: [name], MD;  Location: [facility] ORS;  Service: General;  Laterality: N/A;  pt in [place]  2005   Rehman: Sigmoid colon diverticulosis, moderate sized external hemorrhoids. A few focal areas of mucosal erythema felt to be nonspecific.   ESOPHAGOGASTRODUODENOSCOPY     ESOPHAGOGASTRODUODENOSCOPY N/A 07/18/2016   Rehman: normal exam except duodenal scar. h.pylori serologies negative   FLEXIBLE SIGMOIDOSCOPY N/A 06/11/2020   Procedure: FLEXIBLE SIGMOIDOSCOPY;  Surgeon: Eloise Harman, DO;  Location: [facility] ENDO SU[facility];  Service: Endoscopy;  Laterality: N/A;   HARDWARE REMOVAL Right 03/20/2021   Procedure: HARDWARE REMOVAL right hip;  Surgeon: Carole Civil, MD;  Location: [facility] ORS;  Service: Orthopedics;  Laterality: Right;   HIP PINNING,CANNULATED Right 03/10/2020   Procedure: INTERNAL FIXATION RIGHT HIP;  Surgeon: Carole Civil, MD;  Location: [facility] ORS;  Service: Orthopedics;  Laterality: Right;   ORIF WRIST FRACTURE  12/19/2011   Procedure: OPEN REDUCTION INTERNAL FIXATION (ORIF) WRIST FRACTURE;  Surgeon: Carole Civil, MD;  Location: [facility] ORS;  Service: Orthopedics;  Laterality: Right;   prostate cancer     Diagnosed in the 90s   TONSILLECTOMY      Social History   Socioeconomic History   Marital status: Widowed  Spouse name: Not on file   Number of children: Not on file   Years of education: Not on file   Highest education level: Not on file  Occupational History   Occupation: retired    Fish farm manager: RETIRED  Tobacco Use   Smoking status: Never   Smokeless tobacco: Never  Vaping Use   Vaping Use: Never used  Substance and Sexual Activity   Alcohol use: No   Drug use: No   Sexual activity: Not Currently  Other Topics Concern   Not on file  Social History Narrative   Is long term resident of [facility]    Social Determinants of Health    Financial Resource Strain: Not on file  Food Insecurity: Not on file  Transportation Needs: Not on file  Physical Activity: Not on file  Stress: Not on file  Social Connections: Not on file  Intimate Partner Violence: Not on file   Family History  Problem Relation Age of Onset   Cancer Other    Heart attack Mother    Cancer Brother    Heart attack Brother    Heart disease Sister        by pass      VITAL SIGNS BP (!) 144/73   Pulse 71   Ht '5\' 9"'$  (1.753 m)   Wt 166 lb (75.3 kg)   BMI 24.51 kg/m   Outpatient Encounter Medications as of 07/05/2022  Medication Sig   acetaminophen (TYLENOL) 500 MG tablet Take 500 mg by mouth every 6 (six) hours. Max '3000mg'$  in 24 hour period   alfuzosin (UROXATRAL) 10 MG 24 hr tablet Take 10 mg by mouth daily. 6 pm   dilTIAZem HCl ER 300 MG TB24 Take 1 capsule by mouth daily.   hydrALAZINE (APRESOLINE) 10 MG tablet Take 10 mg by mouth 3 (three) times daily. Hypertensive chronic kidney disease with stage 1 through stage 4 chronic kidney disease, or unspecified chronic kidney disease   isosorbide mononitrate (IMDUR) 60 MG 24 hr tablet Take 60 mg by mouth daily.   levETIRAcetam (KEPPRA) 500 MG tablet Take 500 mg by mouth 2 (two) times daily.   lidocaine 4 % Place 1 patch onto the skin daily. Apply to left hip for chronic pain   lisinopril (ZESTRIL) 40 MG tablet Take 40 mg by mouth daily. Hypertensive chronic kidney disease with stage 1 through stage 4 chronic kidney disease, or unspecified chronic kidney disease   loratadine (CLARITIN) 10 MG tablet Take 10 mg by mouth daily.   Melatonin 10 MG TABS Take 10 mg by mouth at bedtime.   memantine (NAMENDA) 10 MG tablet Take 10 mg by mouth 2 (two) times daily.   methocarbamol (ROBAXIN) 500 MG tablet Take 500 mg by mouth daily.   metoprolol tartrate (LOPRESSOR) 25 MG tablet Take 1 tablet (25 mg total) by mouth 2 (two) times daily.   omeprazole (PRILOSEC) 20 MG capsule Take 1 capsule (20 mg total) by  mouth daily before breakfast.   polyethylene glycol (MIRALAX / GLYCOLAX) 17 g packet Take 17 g by mouth 2 (two) times daily.   potassium chloride SA (KLOR-CON) 20 MEQ tablet Take 40 mEq by mouth 2 (two) times daily.   rOPINIRole (REQUIP) 0.25 MG tablet Take 0.25 mg by mouth at bedtime.   saline (AYR) GEL Place 1 Application into both nostrils every 6 (six) hours as needed.   UNABLE TO FIND Diet:Regular   UNABLE TO FIND Med Name:  120 ml MEDPASS BID daily to support weight  stability Twice A Day Between Meals; 09:00 AM, 09:00 PM   venlafaxine XR (EFFEXOR-XR) 150 MG 24 hr capsule Take 150 mg by mouth daily with breakfast.   Vibegron (GEMTESA) 75 MG TABS Take 1 tablet by mouth daily as needed.   vitamin B-12 (CYANOCOBALAMIN) 1000 MCG tablet Take 1,000 mcg by mouth daily. for vitamin B 12 level 205   [DISCONTINUED] calcium carbonate (OSCAL) 1500 (600 Ca) MG TABS tablet Take 1,500 mg by mouth daily with breakfast.   [DISCONTINUED] Cholecalciferol (D3-1000) 25 MCG (1000 UT) tablet Take 1,000 Units by mouth daily.   No facility-administered encounter medications on file as of 07/05/2022.     SIGNIFICANT DIAGNOSTIC EXAMS  PREVIOUS   09-10-21: chest x-ray: patch asymmetric inflammatory changes. These could represent areas of atypical infection; focal atelectasis infarction or evolving consolidation among other possibilities.  11-15-21: scrotal ultrasounds No evidence of intratesticular   mass or torsion bilateral  No evidence of varicoceles bilateral  Small sized right hydrocele Bilateral epididymal head is enlarges may represent epididymitis   TODAY  01-23-22: dexa: t score -1.331   LABS REVIEWED PREVIOUS      08-10-21: PSA 0.17 testosterone <3 08-16-21: wbc 6.0; hgb 12.0; hct 37.1; mcv 89.0 plt 181; glucose 98; bun 40; creat 1.46; k+ 5.7; na++ 138; ca 9.3 GFR>46 protein 5.8; albumin 3.4; tsh 0.620 08-24-21: glucose 104; bun 46; creat 2.07; k+ 4.7; na++ 141; ca 8.5; GFR 30  08-30-21: vitamin B12   205  09-10-21: wbc 8.5; hgb 12.8; hct 38.5; mcv 87.3; plt 167; glucose 131; bun 25; creat 1.20; k+ 3.9; na++ 140; ca 9.6; GFR 58; iron 38; tibc 261; folate 14.2; CRP 6.7; d-dimer 0.43 09-17-21: CRP <0.5 10-18-21: wbc 6.1; hgb 13.3; hct 41.5; mcv 89.7 plt 190; glucose 91; bun 25; creat 0.95; k+ 3.4; na++ 142; ca 9.3; gfr >60; protein 6.0; albumin 3.5 01-31-22: glucose 100; bun 20; creat 0.94; k+ 3.5; na++ 143; ca 9.5 gfr >60 02-11-22: glucose 102; bun 19; creat 0.91; k+ 3.3; na++ 141; ca 9.3; gfr >60 02-18-22: glucose 114; bun 27; creat 1.10; k+ 3.8; na++ 141; ca 9.3; gfr >60 04-01-22; wbc 6.9; hgb 13.3; hct 41.0; mcv 86.9 plt 174; glucose 115; bun 37; creat 1.07; k+ 4.1; na++ 144; ca 9.7; gfr >60; protein 6.0 albumin 3.5; tsh 0.157 vitamin B12; 856;  04-02-22: free t4: 0.80; RPR negative 04-05-22: wbc 6.6; hgb 12.8; hct 39.1; mcv 86.3 plt 196; glucose 140; bun 28; creat 1.14; k+ 4.2; na++ 142; ca 9.9; gfr >60; protein 6.4; albumin 3.7 05-29-22: wb 6.7; hgb 13.8; hct 42.8; mcv 85.4 plt 205; glucose 142; bun 18; creat 1.09; k+ 3.5; na++ 141; ca 9.5; gfr >60   NO NEW LABS.    Review of Systems  Constitutional:  Negative for malaise/fatigue.  Respiratory:  Negative for cough and shortness of breath.   Cardiovascular:  Negative for chest pain, palpitations and leg swelling.  Gastrointestinal:  Negative for abdominal pain, constipation and heartburn.  Musculoskeletal:  Negative for back pain, joint pain and myalgias.  Skin: Negative.   Neurological:  Negative for dizziness.  Psychiatric/Behavioral:  The patient is not nervous/anxious.    Physical Exam Constitutional:      General: He is not in acute distress.    Appearance: He is well-developed. He is not diaphoretic.  Neck:     Thyroid: No thyromegaly.  Cardiovascular:     Rate and Rhythm: Normal rate and regular rhythm.     Pulses: Normal pulses.  Heart sounds: Normal heart sounds.  Pulmonary:     Effort: Pulmonary effort is normal. No  respiratory distress.     Breath sounds: Normal breath sounds.  Abdominal:     General: Bowel sounds are normal. There is no distension.     Palpations: Abdomen is soft.     Tenderness: There is no abdominal tenderness.  Musculoskeletal:        General: Normal range of motion.     Cervical back: Neck supple.     Right lower leg: No edema.     Left lower leg: No edema.  Lymphadenopathy:     Cervical: No cervical adenopathy.  Skin:    General: Skin is warm and dry.  Neurological:     Mental Status: He is alert. Mental status is at baseline.  Psychiatric:        Mood and Affect: Mood normal.      ASSESSMENT/ PLAN:  TODAY  Aortic atherosclerosis Interstitial lung disease Vascular dementia without behavioral disturbance  Will continue current medications Will continue current plan of care Will continue to monitor his status.   Time spent with patient: 40 minutes: overall health status; dietary; medications.    Ok Edwards NP [facility]  call [phone]

## 2022-07-08 ENCOUNTER — Ambulatory Visit: Payer: Medicare Other | Admitting: Urology

## 2022-07-18 ENCOUNTER — Non-Acute Institutional Stay (SKILLED_NURSING_FACILITY): Payer: Medicare Other | Admitting: Adult Health

## 2022-07-18 ENCOUNTER — Encounter: Payer: Self-pay | Admitting: Adult Health

## 2022-07-18 DIAGNOSIS — E876 Hypokalemia: Secondary | ICD-10-CM

## 2022-07-18 DIAGNOSIS — N1832 Chronic kidney disease, stage 3b: Secondary | ICD-10-CM

## 2022-07-18 DIAGNOSIS — E785 Hyperlipidemia, unspecified: Secondary | ICD-10-CM

## 2022-07-18 DIAGNOSIS — K5909 Other constipation: Secondary | ICD-10-CM

## 2022-07-18 NOTE — Progress Notes (Signed)
Location:  [facility] Room Number: 156D Place of Service:  SNF (31)   CODE STATUS: DNR   Allergies  Allergen Reactions   Hctz [Hydrochlorothiazide]     hypokalemia   Xanax [Alprazolam] Other (See Comments)    Causes hallucinations     Chief Complaint  Patient presents with   Medical Management of Chronic Issues                   Hypokalemia Dyslipidemia:  Chronic constipation:  CKD stage 3b     HPI:  He is a 89 year old long term resident of this facility being seen for the management of his chronic illnesses:  Hypokalemia Dyslipidemia:  Chronic constipation:  CKD stage 3b. There are no reports of uncontrolled pain. He has had one fall this past week without injury. His weight is stable at this time.   Past Medical History:  Diagnosis Date   Anxiety    Cancer [facility])    prostate   Chronic chest wall pain    Closed displaced fracture of right femoral neck ([place]) 03/09/2020   9/30-10/10/2019 minimally displaced right femoral neck fracture sustained in a mechanical fall  Cannulated right hip pinning by Dr. Arther Abbott 10/1  Full dose aspirin prophylaxis through 11/5 with subsequent dosage of 81 mg daily.   Essential hypertension, benign 06/20/2016   GAD (generalized anxiety disorder)    GERD (gastroesophageal reflux disease)    H/O echocardiogram 2016   normal   Heart murmur    "leaking heart valve"   High cholesterol 06/20/2016   HOH (hard of hearing)    left   Hx of falling    Interstitial pulmonary disease (HCC)    Lower GI bleed    MDD (major depressive disorder)    Murmur, cardiac    Poor historian    Prostate cancer ([place])    PUD (peptic ulcer disease)    RLS (restless legs syndrome)    Shortness of breath    Wrist fracture 12/16/2011    Past Surgical History:  Procedure Laterality Date   CHOLECYSTECTOMY N/A 06/28/2020   Procedure: LAPAROSCOPIC CHOLECYSTECTOMY;  Surgeon: [name], MD;  Location: [facility];  Service:  General;  Laterality: N/A;  pt in [place]  2005   Rehman: Sigmoid colon diverticulosis, moderate sized external hemorrhoids. A few focal areas of mucosal erythema felt to be nonspecific.   ESOPHAGOGASTRODUODENOSCOPY     ESOPHAGOGASTRODUODENOSCOPY N/A 07/18/2016   Rehman: normal exam except duodenal scar. h.pylori serologies negative   FLEXIBLE SIGMOIDOSCOPY N/A 06/11/2020   Procedure: FLEXIBLE SIGMOIDOSCOPY;  Surgeon: Eloise Harman, DO;  Location: [facility];  Service: Endoscopy;  Laterality: N/A;   HARDWARE REMOVAL Right 03/20/2021   Procedure: HARDWARE REMOVAL right hip;  Surgeon: Carole Civil, MD;  Location: [facility];  Service: Orthopedics;  Laterality: Right;   HIP PINNING,CANNULATED Right 03/10/2020   Procedure: INTERNAL FIXATION RIGHT HIP;  Surgeon: Carole Civil, MD;  Location: [facility];  Service: Orthopedics;  Laterality: Right;   ORIF WRIST FRACTURE  12/19/2011   Procedure: OPEN REDUCTION INTERNAL FIXATION (ORIF) WRIST FRACTURE;  Surgeon: Carole Civil, MD;  Location: [facility];  Service: Orthopedics;  Laterality: Right;   prostate cancer     Diagnosed in the 90s   TONSILLECTOMY      Social History   Socioeconomic History   Marital status: Widowed    Spouse name: Not on file   Number of  children: Not on file   Years of education: Not on file   Highest education level: Not on file  Occupational History   Occupation: retired    Fish farm manager: RETIRED  Tobacco Use   Smoking status: Never   Smokeless tobacco: Never  Vaping Use   Vaping Use: Never used  Substance and Sexual Activity   Alcohol use: No   Drug use: No   Sexual activity: Not Currently  Other Topics Concern   Not on file  Social History Narrative   Is long term resident of [facility]    Social Determinants of Health   Financial Resource Strain: Not on file  Food Insecurity: Not on file  Transportation Needs: Not on file  Physical Activity: Not on file  Stress: Not on file  Social  Connections: Not on file  Intimate Partner Violence: Not on file   Family History  Problem Relation Age of Onset   Cancer Other    Heart attack Mother    Cancer Brother    Heart attack Brother    Heart disease Sister        by pass      VITAL SIGNS BP (!) 153/66   Pulse 82   Temp 97.8 F (36.6 C)   Resp (!) 22   Ht 5' 9"$  (1.753 m)   Wt 162 lb 8 oz (73.7 kg)   SpO2 97%   BMI 24.00 kg/m   Outpatient Encounter Medications as of 07/18/2022  Medication Sig   acetaminophen (TYLENOL) 500 MG tablet Take 500 mg by mouth every 6 (six) hours. Max 3057m in 24 hour period   alfuzosin (UROXATRAL) 10 MG 24 hr tablet Take 10 mg by mouth daily. 6 pm   dilTIAZem HCl ER 300 MG TB24 Take 1 capsule by mouth daily.   hydrALAZINE (APRESOLINE) 10 MG tablet Take 10 mg by mouth 3 (three) times daily. Hypertensive chronic kidney disease with stage 1 through stage 4 chronic kidney disease, or unspecified chronic kidney disease   isosorbide mononitrate (IMDUR) 60 MG 24 hr tablet Take 60 mg by mouth daily.   levETIRAcetam (KEPPRA) 500 MG tablet Take 500 mg by mouth 2 (two) times daily.   lidocaine 4 % Place 1 patch onto the skin daily. Apply to left hip for chronic pain   lisinopril (ZESTRIL) 40 MG tablet Take 40 mg by mouth daily. Hypertensive chronic kidney disease with stage 1 through stage 4 chronic kidney disease, or unspecified chronic kidney disease   loratadine (CLARITIN) 10 MG tablet Take 10 mg by mouth daily.   Melatonin 10 MG TABS Take 10 mg by mouth at bedtime.   memantine (NAMENDA) 10 MG tablet Take 10 mg by mouth 2 (two) times daily.   methocarbamol (ROBAXIN) 500 MG tablet Take 500 mg by mouth daily.   metoprolol tartrate (LOPRESSOR) 25 MG tablet Take 1 tablet (25 mg total) by mouth 2 (two) times daily.   omeprazole (PRILOSEC) 20 MG capsule Take 1 capsule (20 mg total) by mouth daily before breakfast.   polyethylene glycol (MIRALAX / GLYCOLAX) 17 g packet Take 17 g by mouth 2 (two) times  daily.   potassium chloride SA (KLOR-CON) 20 MEQ tablet Take 40 mEq by mouth 2 (two) times daily.   rOPINIRole (REQUIP) 0.25 MG tablet Take 0.25 mg by mouth at bedtime.   saline (AYR) GEL Place 1 Application into both nostrils every 6 (six) hours as needed.   UNABLE TO FIND Diet:Regular   UNABLE TO FIND Med Name:  120 ml MEDPASS BID daily to support weight stability Twice A Day Between Meals; 09:00 AM, 09:00 PM   venlafaxine XR (EFFEXOR-XR) 150 MG 24 hr capsule Take 150 mg by mouth daily with breakfast.   Vibegron (GEMTESA) 75 MG TABS Take 1 tablet by mouth daily as needed.   vitamin B-12 (CYANOCOBALAMIN) 1000 MCG tablet Take 1,000 mcg by mouth daily. for vitamin B 12 level 205   No facility-administered encounter medications on file as of 07/18/2022.     SIGNIFICANT DIAGNOSTIC EXAMS  PREVIOUS   09-10-21: chest x-ray: patch asymmetric inflammatory changes. These could represent areas of atypical infection; focal atelectasis infarction or evolving consolidation among other possibilities.  11-15-21: scrotal ultrasounds No evidence of intratesticular   mass or torsion bilateral  No evidence of varicoceles bilateral  Small sized right hydrocele Bilateral epididymal head is enlarges may represent epididymitis   TODAY  01-23-22: dexa: t score -1.331   LABS REVIEWED PREVIOUS      08-10-21: PSA 0.17 testosterone <3 08-16-21: wbc 6.0; hgb 12.0; hct 37.1; mcv 89.0 plt 181; glucose 98; bun 40; creat 1.46; k+ 5.7; na++ 138; ca 9.3 GFR>46 protein 5.8; albumin 3.4; tsh 0.620 08-24-21: glucose 104; bun 46; creat 2.07; k+ 4.7; na++ 141; ca 8.5; GFR 30  08-30-21: vitamin B12  205  09-10-21: wbc 8.5; hgb 12.8; hct 38.5; mcv 87.3; plt 167; glucose 131; bun 25; creat 1.20; k+ 3.9; na++ 140; ca 9.6; GFR 58; iron 38; tibc 261; folate 14.2; CRP 6.7; d-dimer 0.43 09-17-21: CRP <0.5 10-18-21: wbc 6.1; hgb 13.3; hct 41.5; mcv 89.7 plt 190; glucose 91; bun 25; creat 0.95; k+ 3.4; na++ 142; ca 9.3; gfr >60; protein 6.0;  albumin 3.5 01-31-22: glucose 100; bun 20; creat 0.94; k+ 3.5; na++ 143; ca 9.5 gfr >60 02-11-22: glucose 102; bun 19; creat 0.91; k+ 3.3; na++ 141; ca 9.3; gfr >60 02-18-22: glucose 114; bun 27; creat 1.10; k+ 3.8; na++ 141; ca 9.3; gfr >60 04-01-22; wbc 6.9; hgb 13.3; hct 41.0; mcv 86.9 plt 174; glucose 115; bun 37; creat 1.07; k+ 4.1; na++ 144; ca 9.7; gfr >60; protein 6.0 albumin 3.5; tsh 0.157 vitamin B12; 856;  04-02-22: free t4: 0.80; RPR negative 04-05-22: wbc 6.6; hgb 12.8; hct 39.1; mcv 86.3 plt 196; glucose 140; bun 28; creat 1.14; k+ 4.2; na++ 142; ca 9.9; gfr >60; protein 6.4; albumin 3.7 05-29-22: wb 6.7; hgb 13.8; hct 42.8; mcv 85.4 plt 205; glucose 142; bun 18; creat 1.09; k+ 3.5; na++ 141; ca 9.5; gfr >60    NO NEW LABS.   Review of Systems  Constitutional:  Negative for malaise/fatigue.  Respiratory:  Negative for cough and shortness of breath.   Cardiovascular:  Negative for chest pain, palpitations and leg swelling.  Gastrointestinal:  Negative for abdominal pain, constipation and heartburn.  Musculoskeletal:  Negative for back pain, joint pain and myalgias.  Skin: Negative.   Neurological:  Negative for dizziness.  Psychiatric/Behavioral:  The patient is not nervous/anxious.    Physical Exam Constitutional:      General: He is not in acute distress.    Appearance: He is well-developed. He is not diaphoretic.  Neck:     Thyroid: No thyromegaly.  Cardiovascular:     Rate and Rhythm: Normal rate and regular rhythm.     Pulses: Normal pulses.     Heart sounds: Normal heart sounds.  Pulmonary:     Effort: Pulmonary effort is normal. No respiratory distress.     Breath sounds: Normal  breath sounds.  Abdominal:     General: Bowel sounds are normal. There is no distension.     Palpations: Abdomen is soft.     Tenderness: There is no abdominal tenderness.  Musculoskeletal:        General: Normal range of motion.     Cervical back: Neck supple.     Right lower leg: No  edema.     Left lower leg: No edema.  Lymphadenopathy:     Cervical: No cervical adenopathy.  Skin:    General: Skin is warm and dry.  Neurological:     Mental Status: He is alert. Mental status is at baseline.  Psychiatric:        Mood and Affect: Mood normal.     ASSESSMENT/ PLAN:  TODAY  Hypokalemia k+ 3.5 will continue k+ 20 meq twice daily   2. Dyslipidemia: is off zocor due to advanced age  13. Chronic constipation: will continue miralax three times weekly and senna s as needed   4. CKD stage 3b: bun 18; creat 1.09; gfr>60    PREVIOUS   5. Major depression recurrent chronic: will continue effexor xr 150 mg daily melatonin 10 mg nightly and is off seroquel    6. RLS: will continue requip 0.25 mg nightly   7. Iron deficiency anemia due to dietary intake: hgb 13.8; is off iron  8. Interstitial lung disease: claritin 10 mg daily   9. Vitamin B 12 deficiency: will monitor   10. Osteopenia: t score -1.331   11. Left hip pain: will continue mobic 7.5 mg daily robaxin 500 mg daily   12. Dementia without behavioral disturbance unspecified dementia type: weight is 166 pounds will continue namenda 10 mg twice daily   13. Aortic atherosclerosis (ct 06-09-20) is on asa 81 mg daily   14. Protein calorie malnutrition: albumin 3.7 will monitor   15. Benign hypertension with chronic kidney disease stage III: b/p 153/66: will continue lopressor 25 mg twice daily; cardizem er 300 mg daily lisinopril 40 mg daily   16. Gastroesophageal reflux disease without esophagitis: will continue prilosec 20 mg daily   17. Prostate cancer/over active bladder: will conitue uroxatral 10 mg daily gemtea 75 mg daily as needed: no oxybutynin due to history of hip fracture; off myebretiq due to hypertension   18. Seizure:  will continue keppra 500 mg twice daily family has opted not to have neurology consult.       Ok Edwards NP [facility]  call [phone]

## 2022-07-23 ENCOUNTER — Ambulatory Visit: Payer: Medicare Other | Admitting: Neurology

## 2022-08-08 ENCOUNTER — Encounter: Payer: Self-pay | Admitting: Radiology

## 2022-08-12 ENCOUNTER — Non-Acute Institutional Stay (INDEPENDENT_AMBULATORY_CARE_PROVIDER_SITE_OTHER): Payer: Medicare Other | Admitting: Adult Health

## 2022-08-12 ENCOUNTER — Encounter: Payer: Self-pay | Admitting: Adult Health

## 2022-08-12 DIAGNOSIS — Z0000 Encounter for general adult medical examination without abnormal findings: Secondary | ICD-10-CM | POA: Diagnosis not present

## 2022-08-12 NOTE — Patient Instructions (Signed)
  Jeffrey Sherman , Thank you for taking time to come for your Medicare Wellness Visit. I appreciate your ongoing commitment to your health goals. Please review the following plan we discussed and let me know if I can assist you in the future.   These are the goals we discussed:  Goals      DIET - INCREASE WATER INTAKE     Follow up with Provider as scheduled     General - Client will not be readmitted within 30 days (C-SNP)        This is a list of the screening recommended for you and due dates:  Health Maintenance  Topic Date Due   COVID-19 Vaccine (5 - 2023-24 season) 04/29/2023*   Medicare Annual Wellness Visit  08/12/2023   DTaP/Tdap/Td vaccine (3 - Td or Tdap) 12/01/2029   Pneumonia Vaccine  Completed   Flu Shot  Completed   Zoster (Shingles) Vaccine  Completed   HPV Vaccine  Aged Out  *Topic was postponed. The date shown is not the original due date.

## 2022-08-12 NOTE — Progress Notes (Signed)
Subjective:   Jeffrey Sherman is a 89 y.o. male who presents for Medicare Annual/Subsequent preventive examination.  Review of Systems    Review of Systems  Constitutional:  Negative for malaise/fatigue.  Respiratory:  Negative for cough and shortness of breath.   Cardiovascular:  Negative for chest pain, palpitations and leg swelling.  Gastrointestinal:  Negative for abdominal pain, constipation and heartburn.  Musculoskeletal:  Negative for back pain, joint pain and myalgias.  Skin: Negative.   Neurological:  Negative for dizziness.  Psychiatric/Behavioral:  The patient is not nervous/anxious.     Cardiac Risk Factors include: advanced age (>78mn, >>79women);hypertension;male gender;sedentary lifestyle     Objective:    Today's Vitals   08/12/22 1159  BP: 138/80  Pulse: 64  Temp: (!) 97.2 F (36.2 C)  Weight: 160 lb 14.4 oz (73 kg)  Height: '5\' 9"'$  (1.753 m)   Body mass index is 23.76 kg/m.     07/18/2022    9:51 AM 07/05/2022    9:41 AM 06/19/2022    1:13 PM 04/05/2022    9:10 AM 03/28/2022   12:09 PM 02/13/2022   12:15 PM 01/29/2022    3:12 PM  Advanced Directives  Does Patient Have a Medical Advance Directive? Yes Yes Yes Yes Yes Yes Yes  Type of Advance Directive Living will;Out of facility DNR (pink MOST or yellow form) Living will;Out of facility DNR (pink MOST or yellow form) Living will;Out of facility DNR (pink MOST or yellow form) Living will;Out of facility DNR (pink MOST or yellow form) Living will Living will;Out of facility DNR (pink MOST or yellow form) Living will;Out of facility DNR (pink MOST or yellow form)  Does patient want to make changes to medical advance directive? No - Patient declined No - Patient declined No - Patient declined No - Patient declined No - Patient declined No - Patient declined No - Patient declined  Pre-existing out of facility DNR order (yellow form or pink MOST form) Pink MOST form placed in chart (order not valid for inpatient  use);Yellow form placed in chart (order not valid for inpatient use) Yellow form placed in chart (order not valid for inpatient use) Yellow form placed in chart (order not valid for inpatient use) Yellow form placed in chart (order not valid for inpatient use) Yellow form placed in chart (order not valid for inpatient use)  Yellow form placed in chart (order not valid for inpatient use)    Current Medications (verified) Outpatient Encounter Medications as of 08/12/2022  Medication Sig   acetaminophen (TYLENOL) 500 MG tablet Take 500 mg by mouth every 6 (six) hours. Max '3000mg'$  in 24 hour period   alfuzosin (UROXATRAL) 10 MG 24 hr tablet Take 10 mg by mouth daily. 6 pm   dilTIAZem HCl ER 300 MG TB24 Take 1 capsule by mouth daily.   hydrALAZINE (APRESOLINE) 10 MG tablet Take 10 mg by mouth 3 (three) times daily. Hypertensive chronic kidney disease with stage 1 through stage 4 chronic kidney disease, or unspecified chronic kidney disease   isosorbide mononitrate (IMDUR) 60 MG 24 hr tablet Take 60 mg by mouth daily.   levETIRAcetam (KEPPRA) 500 MG tablet Take 500 mg by mouth 2 (two) times daily.   lidocaine 4 % Place 1 patch onto the skin daily. Apply to left hip for chronic pain   lisinopril (ZESTRIL) 40 MG tablet Take 40 mg by mouth daily. Hypertensive chronic kidney disease with stage 1 through stage 4 chronic kidney disease, or  unspecified chronic kidney disease   loratadine (CLARITIN) 10 MG tablet Take 10 mg by mouth daily.   Melatonin 10 MG TABS Take 10 mg by mouth at bedtime.   memantine (NAMENDA) 10 MG tablet Take 10 mg by mouth 2 (two) times daily.   methocarbamol (ROBAXIN) 500 MG tablet Take 500 mg by mouth daily.   metoprolol tartrate (LOPRESSOR) 25 MG tablet Take 1 tablet (25 mg total) by mouth 2 (two) times daily.   omeprazole (PRILOSEC) 20 MG capsule Take 1 capsule (20 mg total) by mouth daily before breakfast.   polyethylene glycol (MIRALAX / GLYCOLAX) 17 g packet Take 17 g by mouth 2  (two) times daily.   potassium chloride SA (KLOR-CON) 20 MEQ tablet Take 40 mEq by mouth 2 (two) times daily.   rOPINIRole (REQUIP) 0.25 MG tablet Take 0.25 mg by mouth at bedtime.   saline (AYR) GEL Place 1 Application into both nostrils every 6 (six) hours as needed.   UNABLE TO FIND Diet:Regular   UNABLE TO FIND Med Name:  120 ml MEDPASS BID daily to support weight stability Twice A Day Between Meals; 09:00 AM, 09:00 PM   venlafaxine XR (EFFEXOR-XR) 150 MG 24 hr capsule Take 150 mg by mouth daily with breakfast.   Vibegron (GEMTESA) 75 MG TABS Take 1 tablet by mouth daily as needed.   vitamin B-12 (CYANOCOBALAMIN) 1000 MCG tablet Take 1,000 mcg by mouth daily. for vitamin B 12 level 205   No facility-administered encounter medications on file as of 08/12/2022.    Allergies (verified) Hctz [hydrochlorothiazide] and Xanax [alprazolam]   History: Past Medical History:  Diagnosis Date   Anxiety    Cancer [facility])    prostate   Chronic chest wall pain    Closed displaced fracture of right femoral neck ([place]) 03/09/2020   9/30-10/10/2019 minimally displaced right femoral neck fracture sustained in a mechanical fall  Cannulated right hip pinning by Dr. Arther Abbott 10/1  Full dose aspirin prophylaxis through 11/5 with subsequent dosage of 81 mg daily.   Essential hypertension, benign 06/20/2016   GAD (generalized anxiety disorder)    GERD (gastroesophageal reflux disease)    H/O echocardiogram 2016   normal   Heart murmur    "leaking heart valve"   High cholesterol 06/20/2016   HOH (hard of hearing)    left   Hx of falling    Interstitial pulmonary disease (HCC)    Lower GI bleed    MDD (major depressive disorder)    Murmur, cardiac    Poor historian    Prostate cancer ([place])    PUD (peptic ulcer disease)    RLS (restless legs syndrome)    Shortness of breath    Wrist fracture 12/16/2011   Past Surgical History:  Procedure Laterality Date   CHOLECYSTECTOMY N/A 06/28/2020    Procedure: LAPAROSCOPIC CHOLECYSTECTOMY;  Surgeon: [name], MD;  Location: [facility];  Service: General;  Laterality: N/A;  pt in [place]  2005   Rehman: Sigmoid colon diverticulosis, moderate sized external hemorrhoids. A few focal areas of mucosal erythema felt to be nonspecific.   ESOPHAGOGASTRODUODENOSCOPY     ESOPHAGOGASTRODUODENOSCOPY N/A 07/18/2016   Rehman: normal exam except duodenal scar. h.pylori serologies negative   FLEXIBLE SIGMOIDOSCOPY N/A 06/11/2020   Procedure: FLEXIBLE SIGMOIDOSCOPY;  Surgeon: Eloise Harman, DO;  Location: [facility];  Service: Endoscopy;  Laterality: N/A;   HARDWARE REMOVAL Right 03/20/2021   Procedure: HARDWARE REMOVAL right hip;  Surgeon:  Carole Civil, MD;  Location: [facility];  Service: Orthopedics;  Laterality: Right;   HIP PINNING,CANNULATED Right 03/10/2020   Procedure: INTERNAL FIXATION RIGHT HIP;  Surgeon: Carole Civil, MD;  Location: [facility];  Service: Orthopedics;  Laterality: Right;   ORIF WRIST FRACTURE  12/19/2011   Procedure: OPEN REDUCTION INTERNAL FIXATION (ORIF) WRIST FRACTURE;  Surgeon: Carole Civil, MD;  Location: [facility];  Service: Orthopedics;  Laterality: Right;   prostate cancer     Diagnosed in the 90s   TONSILLECTOMY     Family History  Problem Relation Age of Onset   Cancer Other    Heart attack Mother    Cancer Brother    Heart attack Brother    Heart disease Sister        by pass   Social History   Socioeconomic History   Marital status: Widowed    Spouse name: Not on file   Number of children: Not on file   Years of education: Not on file   Highest education level: Not on file  Occupational History   Occupation: retired    Fish farm manager: RETIRED  Tobacco Use   Smoking status: Never   Smokeless tobacco: Never  Vaping Use   Vaping Use: Never used  Substance and Sexual Activity   Alcohol use: No   Drug use: No   Sexual activity: Not Currently  Other Topics Concern   Not  on file  Social History Narrative   Is long term resident of [facility]    Social Determinants of Health   Financial Resource Strain: Not on file  Food Insecurity: Not on file  Transportation Needs: Not on file  Physical Activity: Not on file  Stress: Not on file  Social Connections: Not on file    Tobacco Counseling Counseling given: Not Answered   Clinical Intake:  Pre-visit preparation completed: Yes  Pain : No/denies pain     BMI - recorded: 23.76 Nutritional Status: BMI of 19-24  Normal Nutritional Risks: Unintentional weight loss Diabetes: No  How often do you need to have someone help you when you read instructions, pamphlets, or other written materials from your doctor or pharmacy?: 5 - [place] Needed?: No      Activities of Daily Living    08/12/2022   12:12 PM  In your present state of health, do you have any difficulty performing the following activities:  Hearing? 0  Vision? 0  Difficulty concentrating or making decisions? 1  Walking or climbing stairs? 1  Dressing or bathing? 1  Doing errands, shopping? 1  Preparing Food and eating ? Y  Using the Toilet? Y  In the past six months, have you accidently leaked urine? Y  Do you have problems with loss of bowel control? Y  Managing your Medications? Y  Managing your Finances? Y  Housekeeping or managing your Housekeeping? Y    Patient Care Team: Gerlene Fee, NP as PCP - [facility] ([facility], [place] ([place])  Indicate any recent [place] you may have received from other than [facility] providers in the past year (date may be approximate).     Assessment:   This is a routine wellness examination for [place].  Hearing/Vision screen No results found.  Dietary issues and exercise activities discussed: Current Exercise Habits: The patient does not participate in regular exercise at present, Exercise limited by: None identified    Goals Addressed  This Visit's Progress    DIET - INCREASE WATER INTAKE   On track    Follow up with Provider as scheduled   On track    General - Client will not be readmitted within 30 days ([facility])   On track      Depression Screen    08/12/2022   12:10 PM 07/18/2022    9:48 AM 08/09/2021    2:26 PM 06/26/2021    1:35 PM 08/08/2020   10:11 AM  PHQ 2/9 Scores  PHQ - 2 Score 2 0 1 0 0  PHQ- 9 Score 6  4      Fall Risk    08/12/2022   12:09 PM 07/18/2022    9:47 AM 08/09/2021    2:26 PM 07/10/2021   10:35 AM 06/26/2021    1:36 PM  Fall Risk   Falls in the past year? 1 0 0 0 0  Number falls in past yr: 1 0 0 0 0  Injury with Fall? 0 0 0 0 0  Risk for fall due to : History of fall(s);Impaired balance/gait;Impaired mobility No Fall Risks Impaired balance/gait;Impaired mobility Impaired balance/gait;Impaired mobility   Follow up  Falls evaluation completed       FALL RISK PREVENTION PERTAINING TO THE HOME:  Any stairs in or around the home? Yes  If so, are there any without handrails? No  Home free of loose throw rugs in walkways, pet beds, electrical cords, etc? Yes  Adequate lighting in your home to reduce risk of falls? Yes   ASSISTIVE DEVICES UTILIZED TO PREVENT FALLS:  Life alert? No  Use of a cane, walker or w/c? Yes  Grab bars in the bathroom? Yes  Shower chair or bench in shower? Yes  Elevated toilet seat or a handicapped toilet? Yes   TIMED UP AND GO:  Was the test performed? No .     Cognitive Function:    08/12/2022   12:12 PM 08/09/2021    2:29 PM 08/08/2020   10:13 AM  MMSE - Mini Mental State Exam  Not completed: Unable to complete    Orientation to time  5 5  Orientation to Place  5 5  Registration  3 3  Attention/ Calculation  4 4  Recall  3 3  Language- name 2 objects  2 2  Language- repeat  1 1  Language- follow 3 step command  3 3  Language- read & follow direction  1 1  Write a sentence  0 0  Copy design  0 1  Total score  27 28         08/12/2022   12:13 PM 08/09/2021    2:30 PM 08/08/2020   10:14 AM  6CIT Screen  What Year? 4 points 0 points 0 points  What month? 3 points 0 points 0 points  What time? 3 points 0 points 0 points  Count back from 20 4 points 2 points 2 points  Months in reverse 4 points 2 points 2 points  Repeat phrase 4 points 0 points 0 points  Total Score 22 points 4 points 4 points    Immunizations Immunization History  Administered Date(s) Administered   Covid-19, Mrna,Vaccine(Spikevax)8yr and older 04/03/2022   Fluad Quad(high Dose 65+) 03/14/2021   Influenza Split 03/10/2013   Influenza, High Dose Seasonal PF 03/31/2018, 03/13/2022   Influenza-Unspecified 03/17/2020   MODERNA COVID-19 SARS-COV-2 PEDS BIVALENT BOOSTER 6Y-11Y 03/28/2021   Moderna Covid-19 Vaccine Bivalent Booster 136yr& up 04/03/2021,  04/10/2021, 05/14/2021, 06/15/2021, 07/11/2021   Moderna SARS-COV2 Booster Vaccination 04/13/2020, 09/20/2020, 04/03/2021, 10/30/2021   Moderna Sars-Covid-2 Vaccination 06/22/2019, 08/02/2019   PNEUMOCOCCAL CONJUGATE-20 07/21/2020   Pneumococcal Conjugate-13 03/16/2014   Pneumococcal Polysaccharide-23 04/19/1997   Td 11/14/2006   Tdap 12/02/2019   Zoster Recombinat (Shingrix) 05/01/2019, 06/30/2019    TDAP status: Up to date  Flu Vaccine status: Up to date  Pneumococcal vaccine status: Up to date  Covid-19 vaccine status: Completed vaccines  Qualifies for Shingles Vaccine? Yes   Zostavax completed Yes   Shingrix Completed?: No.    Education has been provided regarding the importance of this vaccine. Patient has been advised to call insurance company to determine out of pocket expense if they have not yet received this vaccine. Advised may also receive vaccine at local pharmacy or Health Dept. Verbalized acceptance and understanding.  Screening Tests Health Maintenance  Topic Date Due   COVID-19 Vaccine (5 - 2023-24 season) 04/29/2023 (Originally 05/29/2022)   Medicare Annual  Wellness (AWV)  08/12/2023   DTaP/Tdap/Td (3 - Td or Tdap) 12/01/2029   Pneumonia Vaccine 51+ Years old  Completed   INFLUENZA VACCINE  Completed   Zoster Vaccines- Shingrix  Completed   HPV VACCINES  Aged Out    Health Maintenance  There are no preventive care reminders to display for this patient.   Colorectal cancer screening: No longer required.   Lung Cancer Screening: (Low Dose CT Chest recommended if Age 67-80 years, 30 pack-year currently smoking OR have quit w/in 15years.) does not qualify.   Lung Cancer Screening Referral: n/a  Additional Screening:  Hepatitis C Screening: does not qualify; Completed   Vision Screening: Recommended annual ophthalmology exams for early detection of glaucoma and other disorders of the eye. Is the patient up to date with their annual eye exam?  No  Who is the provider or what is the name of the office in which the patient attends annual eye exams?  If pt is not established with a provider, would they like to be referred to a provider to establish care? No .   Dental Screening: Recommended annual dental exams for proper oral hygiene  Community Resource Referral / Chronic Care Management: CRR required this visit?  No   CCM required this visit?  No      Plan:     I have personally reviewed and noted the following in the patient's chart:   Medical and social history Use of alcohol, tobacco or illicit drugs  Current medications and supplements including opioid prescriptions. Patient is not currently taking opioid prescriptions. Functional ability and status Nutritional status Physical activity Advanced directives List of other physicians Hospitalizations, surgeries, and ER visits in previous 12 months Vitals Screenings to include cognitive, depression, and falls Referrals and appointments  In addition, I have reviewed and discussed with patient certain preventive protocols, quality metrics, and best practice recommendations. A  written personalized care plan for preventive services as well as general preventive health recommendations were provided to patient.     Gerlene Fee, NP   08/12/2022   Nurse Notes: this exam has been performed by myself at this facility

## 2022-08-14 ENCOUNTER — Non-Acute Institutional Stay (SKILLED_NURSING_FACILITY): Payer: Medicare Other | Admitting: Adult Health

## 2022-08-14 ENCOUNTER — Encounter: Payer: Self-pay | Admitting: Adult Health

## 2022-08-14 DIAGNOSIS — R627 Adult failure to thrive: Secondary | ICD-10-CM | POA: Diagnosis not present

## 2022-08-14 DIAGNOSIS — F0150 Vascular dementia without behavioral disturbance: Secondary | ICD-10-CM | POA: Diagnosis not present

## 2022-08-14 HISTORY — DX: Adult failure to thrive: R62.7

## 2022-08-14 NOTE — Progress Notes (Signed)
Location:  [place] Room Number: 156 D Place of Service:  SNF (31)   CODE STATUS: DNR  Allergies  Allergen Reactions   Hctz [Hydrochlorothiazide]     hypokalemia   Xanax [Alprazolam] Other (See Comments)    Causes hallucinations     Chief Complaint  Patient presents with   Acute Visit    Change in status     HPI:  He is having a global decline in his status. He now requires assist with his meals and to have his meat chopped up. He now requires to have his medications crushed. His current weight is 160 pounds; Jan weight was 166 pounds. His MOST form indicates to have comfort care only; with no ivf or abt; no tube feeding. Will reduce; discontinue medications as able.   Past Medical History:  Diagnosis Date   Anxiety    Cancer [facility])    prostate   Chronic chest wall pain    Closed displaced fracture of right femoral neck ([place]) 03/09/2020   9/30-10/10/2019 minimally displaced right femoral neck fracture sustained in a mechanical fall  Cannulated right hip pinning by Dr. Arther Abbott 10/1  Full dose aspirin prophylaxis through 11/5 with subsequent dosage of 81 mg daily.   Essential hypertension, benign 06/20/2016   GAD (generalized anxiety disorder)    GERD (gastroesophageal reflux disease)    H/O echocardiogram 2016   normal   Heart murmur    "leaking heart valve"   High cholesterol 06/20/2016   HOH (hard of hearing)    left   Hx of falling    Interstitial pulmonary disease (HCC)    Lower GI bleed    MDD (major depressive disorder)    Murmur, cardiac    Poor historian    Prostate cancer ([place])    PUD (peptic ulcer disease)    RLS (restless legs syndrome)    Shortness of breath    Wrist fracture 12/16/2011    Past Surgical History:  Procedure Laterality Date   CHOLECYSTECTOMY N/A 06/28/2020   Procedure: LAPAROSCOPIC CHOLECYSTECTOMY;  Surgeon: [name], MD;  Location: [facility] ORS;  Service: General;  Laterality: N/A;  pt in [place]  2005   Rehman: Sigmoid colon diverticulosis, moderate sized external hemorrhoids. A few focal areas of mucosal erythema felt to be nonspecific.   ESOPHAGOGASTRODUODENOSCOPY     ESOPHAGOGASTRODUODENOSCOPY N/A 07/18/2016   Rehman: normal exam except duodenal scar. h.pylori serologies negative   FLEXIBLE SIGMOIDOSCOPY N/A 06/11/2020   Procedure: FLEXIBLE SIGMOIDOSCOPY;  Surgeon: Eloise Harman, DO;  Location: [facility] ENDO SUITE;  Service: Endoscopy;  Laterality: N/A;   HARDWARE REMOVAL Right 03/20/2021   Procedure: HARDWARE REMOVAL right hip;  Surgeon: Carole Civil, MD;  Location: [facility] ORS;  Service: Orthopedics;  Laterality: Right;   HIP PINNING,CANNULATED Right 03/10/2020   Procedure: INTERNAL FIXATION RIGHT HIP;  Surgeon: Carole Civil, MD;  Location: [facility] ORS;  Service: Orthopedics;  Laterality: Right;   ORIF WRIST FRACTURE  12/19/2011   Procedure: OPEN REDUCTION INTERNAL FIXATION (ORIF) WRIST FRACTURE;  Surgeon: Carole Civil, MD;  Location: [facility] ORS;  Service: Orthopedics;  Laterality: Right;   prostate cancer     Diagnosed in the 90s   TONSILLECTOMY      Social History   Socioeconomic History   Marital status: Widowed    Spouse name: Not on file   Number of children: Not on file   Years of education: Not on file  Highest education level: Not on file  Occupational History   Occupation: retired    Fish farm manager: RETIRED  Tobacco Use   Smoking status: Never   Smokeless tobacco: Never  Vaping Use   Vaping Use: Never used  Substance and Sexual Activity   Alcohol use: No   Drug use: No   Sexual activity: Not Currently  Other Topics Concern   Not on file  Social History Narrative   Is long term resident of [facility]    Social Determinants of Health   Financial Resource Strain: Not on file  Food Insecurity: Not on file  Transportation Needs: Not on file  Physical Activity: Not on file  Stress: Not on file  Social Connections: Not on file  Intimate  Partner Violence: Not on file   Family History  Problem Relation Age of Onset   Cancer Other    Heart attack Mother    Cancer Brother    Heart attack Brother    Heart disease Sister        by pass      VITAL SIGNS BP 138/86   Pulse 63   Temp (!) 97.2 F (36.2 C)   Ht '5\' 9"'$  (1.753 m)   Wt 160 lb (72.6 kg)   SpO2 97%   BMI 23.63 kg/m   Outpatient Encounter Medications as of 08/14/2022  Medication Sig   acetaminophen (TYLENOL) 500 MG tablet Take 500 mg by mouth every 6 (six) hours. Max '3000mg'$  in 24 hour period   alfuzosin (UROXATRAL) 10 MG 24 hr tablet Take 10 mg by mouth daily. 6 pm   Baclofen 5 MG TABS Take 1 tablet by mouth every 8 (eight) hours as needed.   dilTIAZem HCl ER 300 MG TB24 Take 1 capsule by mouth daily.   hydrALAZINE (APRESOLINE) 10 MG tablet Take 10 mg by mouth 3 (three) times daily. Hypertensive chronic kidney disease with stage 1 through stage 4 chronic kidney disease, or unspecified chronic kidney disease   isosorbide mononitrate (IMDUR) 60 MG 24 hr tablet Take 60 mg by mouth daily.   levETIRAcetam (KEPPRA) 500 MG tablet Take 500 mg by mouth 2 (two) times daily.   lidocaine 4 % Place 1 patch onto the skin daily. Apply to left hip for chronic pain   lisinopril (ZESTRIL) 40 MG tablet Take 40 mg by mouth daily. Hypertensive chronic kidney disease with stage 1 through stage 4 chronic kidney disease, or unspecified chronic kidney disease   loratadine (CLARITIN) 10 MG tablet Take 10 mg by mouth daily.   Melatonin 10 MG TABS Take 10 mg by mouth at bedtime.   memantine (NAMENDA) 10 MG tablet Take 10 mg by mouth 2 (two) times daily.   methocarbamol (ROBAXIN) 500 MG tablet Take 500 mg by mouth daily.   metoprolol tartrate (LOPRESSOR) 25 MG tablet Take 1 tablet (25 mg total) by mouth 2 (two) times daily.   omeprazole (PRILOSEC) 20 MG capsule Take 1 capsule (20 mg total) by mouth daily before breakfast.   polyethylene glycol (MIRALAX / GLYCOLAX) 17 g packet Take 17 g by  mouth 2 (two) times daily.   potassium chloride SA (KLOR-CON) 20 MEQ tablet Take 40 mEq by mouth 2 (two) times daily.   rOPINIRole (REQUIP) 0.25 MG tablet Take 0.25 mg by mouth at bedtime.   saline (AYR) GEL Place 1 Application into both nostrils every 6 (six) hours as needed.   UNABLE TO FIND Diet:Regular   UNABLE TO FIND Med Name:  120 ml MEDPASS  BID daily to support weight stability Twice A Day Between Meals; 09:00 AM, 09:00 PM   venlafaxine XR (EFFEXOR-XR) 150 MG 24 hr capsule Take 150 mg by mouth daily with breakfast.   Vibegron (GEMTESA) 75 MG TABS Take 1 tablet by mouth daily as needed.   vitamin B-12 (CYANOCOBALAMIN) 1000 MCG tablet Take 1,000 mcg by mouth daily. for vitamin B 12 level 205   No facility-administered encounter medications on file as of 08/14/2022.     SIGNIFICANT DIAGNOSTIC EXAMS   PREVIOUS   09-10-21: chest x-ray: patch asymmetric inflammatory changes. These could represent areas of atypical infection; focal atelectasis infarction or evolving consolidation among other possibilities.  11-15-21: scrotal ultrasounds No evidence of intratesticular   mass or torsion bilateral  No evidence of varicoceles bilateral  Small sized right hydrocele Bilateral epididymal head is enlarges may represent epididymitis   TODAY  01-23-22: dexa: t score -1.331   LABS REVIEWED PREVIOUS      08-10-21: PSA 0.17 testosterone <3 08-16-21: wbc 6.0; hgb 12.0; hct 37.1; mcv 89.0 plt 181; glucose 98; bun 40; creat 1.46; k+ 5.7; na++ 138; ca 9.3 GFR>46 protein 5.8; albumin 3.4; tsh 0.620 08-24-21: glucose 104; bun 46; creat 2.07; k+ 4.7; na++ 141; ca 8.5; GFR 30  08-30-21: vitamin B12  205  09-10-21: wbc 8.5; hgb 12.8; hct 38.5; mcv 87.3; plt 167; glucose 131; bun 25; creat 1.20; k+ 3.9; na++ 140; ca 9.6; GFR 58; iron 38; tibc 261; folate 14.2; CRP 6.7; d-dimer 0.43 09-17-21: CRP <0.5 10-18-21: wbc 6.1; hgb 13.3; hct 41.5; mcv 89.7 plt 190; glucose 91; bun 25; creat 0.95; k+ 3.4; na++ 142; ca 9.3;  gfr >60; protein 6.0; albumin 3.5 01-31-22: glucose 100; bun 20; creat 0.94; k+ 3.5; na++ 143; ca 9.5 gfr >60 02-11-22: glucose 102; bun 19; creat 0.91; k+ 3.3; na++ 141; ca 9.3; gfr >60 02-18-22: glucose 114; bun 27; creat 1.10; k+ 3.8; na++ 141; ca 9.3; gfr >60 04-01-22; wbc 6.9; hgb 13.3; hct 41.0; mcv 86.9 plt 174; glucose 115; bun 37; creat 1.07; k+ 4.1; na++ 144; ca 9.7; gfr >60; protein 6.0 albumin 3.5; tsh 0.157 vitamin B12; 856;  04-02-22: free t4: 0.80; RPR negative 04-05-22: wbc 6.6; hgb 12.8; hct 39.1; mcv 86.3 plt 196; glucose 140; bun 28; creat 1.14; k+ 4.2; na++ 142; ca 9.9; gfr >60; protein 6.4; albumin 3.7 05-29-22: wb 6.7; hgb 13.8; hct 42.8; mcv 85.4 plt 205; glucose 142; bun 18; creat 1.09; k+ 3.5; na++ 141; ca 9.5; gfr >60    NO NEW LABS.   Review of Systems  Reason unable to perform ROS: unable to fully participate.   Physical Exam Constitutional:      General: He is not in acute distress.    Appearance: He is underweight. He is not diaphoretic.  Neck:     Thyroid: No thyromegaly.  Cardiovascular:     Rate and Rhythm: Normal rate and regular rhythm.     Pulses: Normal pulses.     Heart sounds: Normal heart sounds.  Pulmonary:     Effort: Pulmonary effort is normal. No respiratory distress.     Breath sounds: Normal breath sounds.  Abdominal:     General: Bowel sounds are normal. There is no distension.     Palpations: Abdomen is soft.     Tenderness: There is no abdominal tenderness.  Musculoskeletal:        General: Normal range of motion.     Cervical back: Neck supple.  Right lower leg: No edema.     Left lower leg: No edema.  Lymphadenopathy:     Cervical: No cervical adenopathy.  Skin:    General: Skin is warm and dry.  Neurological:     Mental Status: He is alert. Mental status is at baseline.  Psychiatric:        Mood and Affect: Mood normal.       ASSESSMENT/ PLAN:  TODAY  Failure to thrive in adult Vascular dementia without  behavioral disturbance  Will stop the following medications: lidocaine; robaxin; alfuzosin; baclofen; claritin; vitamin B12; wean off namenda Will change the following medications: lower keppra to 250 mg twice daily; will change to isosorbide 30 mg twice daily and effexor 75 mg twice daily    Ok Edwards NP [facility]   call [phone]

## 2022-08-19 ENCOUNTER — Non-Acute Institutional Stay: Payer: Self-pay | Admitting: Student

## 2022-08-19 ENCOUNTER — Encounter: Payer: Self-pay | Admitting: Student

## 2022-08-19 DIAGNOSIS — K219 Gastro-esophageal reflux disease without esophagitis: Secondary | ICD-10-CM

## 2022-08-19 DIAGNOSIS — F0150 Vascular dementia without behavioral disturbance: Secondary | ICD-10-CM

## 2022-08-19 DIAGNOSIS — R627 Adult failure to thrive: Secondary | ICD-10-CM

## 2022-08-19 DIAGNOSIS — I10 Essential (primary) hypertension: Secondary | ICD-10-CM

## 2022-08-19 DIAGNOSIS — N3281 Overactive bladder: Secondary | ICD-10-CM

## 2022-08-19 DIAGNOSIS — E876 Hypokalemia: Secondary | ICD-10-CM

## 2022-08-19 DIAGNOSIS — K5909 Other constipation: Secondary | ICD-10-CM

## 2022-08-19 DIAGNOSIS — R569 Unspecified convulsions: Secondary | ICD-10-CM

## 2022-08-19 NOTE — Assessment & Plan Note (Signed)
Worsening function 2/2 dementia. Continuing with comfort care measures, PRN medications to assist with comfort. Assist with meals, cutting meat for eating. See MOST form above in H&P.

## 2022-08-19 NOTE — Assessment & Plan Note (Signed)
Keppra 250 BID, no new seizure like activity, seizure and fall precautions.

## 2022-08-19 NOTE — Assessment & Plan Note (Addendum)
BP: (!) 141/79 as of yesterday. Poorly controlled on drug regimen Do not want to increase medication burden, will continue to monitor. Metoprolol 25 BID, Hydral 10 TID, Dilt 300 mg daily, Lisinopril 40 mg daily, isosorbide dinitrate 30 mg BID

## 2022-08-19 NOTE — Assessment & Plan Note (Addendum)
Miralax for comfort, most recent BM documented over last couple of days.

## 2022-08-19 NOTE — Progress Notes (Signed)
Location:  [place] of Service:   [facility]    CODE STATUS: DNR  Allergies  Allergen Reactions   Hctz [Hydrochlorothiazide]     hypokalemia   Xanax [Alprazolam] Other (See Comments)    Causes hallucinations     HPI: Jeffrey Sherman was visited by the following members of the [facility]: Dr. Erskine Emery (PGY-2), Dr. Alcus Dad (PGY-3), Dr. Sherren Mocha McDiarmid (faculty).    Since last visit, has been seen on multiple occasions for chronic conditions with notable weight loss and worsening function.   He had his care plan meeting on 07/05/22. Noted to have multiple falls, now nonambulatory, required set up for meals at that time. Did not attend activities.  On 07/18/2022, was seen for chronic conditions, noted to have one fall and stable weight without uncontrolled pain.   Patient had annual Medicare preventative examination on 08/12/22.   On 08/14/22, was seen by Ok Edwards, NP for global decline in his status. At that time, required assist with meals, had to have meat chopped up for eating and medications crushed. Weight decreased from 166>160 lbs. No IVF or ABT, no tube feeds outlined on MOST form, comfort care measures only. At this visit, discontinued Lidocaine, robaxin, alfuzosin, baclofen, claritin, B12, weaned off of namenda, lowered Keppra to 250 mg BID, changed to isosorbide 30 mg BID, effexor 75 mg BID. Had 76-100% documented lunch yesterday and taking fluids (342m) documented.   Per MOST Form effective 07/01/22: Do Not Attempt Resuscitation (DNR/no CPR), Comfort Measures: Keep clean, warm and dry. Use medication by any route, positioning, wound care and other measures to relieve pain and suffering. Use oxygen, suction and manual treatment of airway obstruction as needed for comfort. Do not transfer to hospital unless comfort needs cannot be met in current location. No antibiotics. No IV fluids. No feeding tube.  Past  Medical History:  Diagnosis Date   Anxiety    Cancer ([facility]    prostate   Chronic chest wall pain    Closed displaced fracture of right [place] 03/09/2020   9/30-10/10/2019 minimally displaced right femoral neck fracture sustained in a mechanical fall  Cannulated right hip pinning by Dr. SArther Abbott10/1  Full dose aspirin prophylaxis through 11/5 with subsequent dosage of 81 mg daily.   Essential hypertension, benign 06/20/2016   GAD (generalized anxiety disorder)    GERD (gastroesophageal reflux disease)    H/O echocardiogram 2016   normal   Heart murmur    "leaking heart valve"   High cholesterol 06/20/2016   HOH (hard of hearing)    left   Hx of falling    Interstitial pulmonary disease (HCC)    Lower GI bleed    MDD (major depressive disorder)    Murmur, cardiac    Poor historian    [place]    PUD (peptic ulcer disease)    RLS (restless legs syndrome)    Shortness of breath    Wrist fracture 12/16/2011    Past Surgical History:  Procedure Laterality Date   CHOLECYSTECTOMY N/A 06/28/2020   Procedure: LAPAROSCOPIC CHOLECYSTECTOMY;  Surgeon: BVirl Cagey MD;  Location: [facility] ORS;  Service: General;  Laterality: N/A;  [place]   Rehman: Sigmoid colon diverticulosis, moderate sized external hemorrhoids. A few focal areas of mucosal erythema felt to be nonspecific.   ESOPHAGOGASTRODUODENOSCOPY     ESOPHAGOGASTRODUODENOSCOPY N/A 07/18/2016  Rehman: normal exam except duodenal scar. h.pylori serologies negative   FLEXIBLE SIGMOIDOSCOPY N/A 06/11/2020   Procedure: FLEXIBLE SIGMOIDOSCOPY;  Surgeon: Eloise Harman, DO;  Location: [facility] ENDO SUITE;  Service: Endoscopy;  Laterality: N/A;   HARDWARE REMOVAL Right 03/20/2021   Procedure: HARDWARE REMOVAL right hip;  Surgeon: Carole Civil, MD;  Location: [facility] ORS;  Service: Orthopedics;  Laterality: Right;   HIP PINNING,CANNULATED Right 03/10/2020   Procedure: INTERNAL  FIXATION RIGHT HIP;  Surgeon: Carole Civil, MD;  Location: [facility] ORS;  Service: Orthopedics;  Laterality: Right;   ORIF WRIST FRACTURE  12/19/2011   Procedure: OPEN REDUCTION INTERNAL FIXATION (ORIF) WRIST FRACTURE;  Surgeon: Carole Civil, MD;  Location: [facility] ORS;  Service: Orthopedics;  Laterality: Right;   prostate cancer     Diagnosed in the 90s   TONSILLECTOMY      Social History   Socioeconomic History   Marital status: Widowed    Spouse name: Not on file   Number of children: Not on file   Years of education: Not on file   Highest education level: Not on file  Occupational History   Occupation: retired    Fish farm manager: RETIRED  Tobacco Use   Smoking status: Never   Smokeless tobacco: Never  Vaping Use   Vaping Use: Never used  Substance and Sexual Activity   Alcohol use: No   Drug use: No   Sexual activity: Not Currently  Other Topics Concern   Not on file  Social History Narrative   Is long term resident of [facility]    Social Determinants of Health   Financial Resource Strain: Not on file  Food Insecurity: Not on file  Transportation Needs: Not on file  Physical Activity: Not on file  Stress: Not on file  Social Connections: Not on file  Intimate Partner Violence: Not on file   Family History  Problem Relation Age of Onset   Cancer Other    Heart attack Mother    Cancer Brother    Heart attack Brother    Heart disease Sister        by pass   ROS: limited 2/2 mental status. No complaints today    VITAL SIGNS BP (!) 141/79   Pulse 76   Temp (!) 97.2 F (36.2 C)   Resp 20   SpO2 97%     Physical Exam Vitals reviewed.  Constitutional:      Appearance: Normal appearance.  HENT:     Head: Normocephalic and atraumatic.     Nose: Nose normal.     Mouth/Throat:     Mouth: Mucous membranes are moist.  Eyes:     Conjunctiva/sclera: Conjunctivae normal.  Cardiovascular:     Rate and Rhythm: Normal rate and regular rhythm.  Pulmonary:     Effort:  Pulmonary effort is normal.     Breath sounds: Normal breath sounds.  Abdominal:     General: Abdomen is flat. Bowel sounds are normal.     Palpations: Abdomen is soft.  Musculoskeletal:        General: Normal range of motion.     Cervical back: Normal range of motion.  Skin:    Capillary Refill: Capillary refill takes less than 2 seconds.  Neurological:     Mental Status: He is alert.      Outpatient Encounter Medications as of 08/19/2022  Medication Sig   acetaminophen (TYLENOL) 500 MG tablet Take 500 mg by mouth every 6 (six) hours. Max '3000mg'$  in  24 hour period   dilTIAZem HCl ER 300 MG TB24 Take 1 capsule by mouth daily.   hydrALAZINE (APRESOLINE) 10 MG tablet Take 10 mg by mouth 3 (three) times daily. Hypertensive chronic kidney disease with stage 1 through stage 4 chronic kidney disease, or unspecified chronic kidney disease   isosorbide dinitrate (ISORDIL) 30 MG tablet Take 30 mg by mouth 2 (two) times daily.   levETIRAcetam (KEPPRA) 250 MG tablet Take 250 mg by mouth 2 (two) times daily.   lisinopril (ZESTRIL) 40 MG tablet Take 40 mg by mouth daily. Hypertensive chronic kidney disease with stage 1 through stage 4 chronic kidney disease, or unspecified chronic kidney disease   Melatonin 10 MG TABS Take 10 mg by mouth at bedtime.   metoprolol tartrate (LOPRESSOR) 25 MG tablet Take 1 tablet (25 mg total) by mouth 2 (two) times daily.   omeprazole (PRILOSEC) 20 MG capsule Take 1 capsule (20 mg total) by mouth daily before breakfast.   polyethylene glycol (MIRALAX / GLYCOLAX) 17 g packet Take 17 g by mouth 2 (two) times daily.   potassium chloride SA (KLOR-CON) 20 MEQ tablet Take 40 mEq by mouth 2 (two) times daily.   rOPINIRole (REQUIP) 0.25 MG tablet Take 0.25 mg by mouth at bedtime.   saline (AYR) GEL Place 1 Application into both nostrils every 6 (six) hours as needed.   UNABLE TO FIND Diet:Regular   UNABLE TO FIND Med Name:  120 ml MEDPASS BID daily to support weight  stability Twice A Day Between Meals; 09:00 AM, 09:00 PM   venlafaxine (EFFEXOR) 75 MG tablet Take 75 mg by mouth 2 (two) times daily with a meal.   Vibegron (GEMTESA) 75 MG TABS Take 1 tablet by mouth daily as needed.   vitamin B-12 (CYANOCOBALAMIN) 1000 MCG tablet Take 1,000 mcg by mouth daily. for vitamin B 12 level 205   No facility-administered encounter medications on file as of 08/19/2022.     SIGNIFICANT DIAGNOSTIC EXAMS   PREVIOUS    09-10-21: chest x-ray: patch asymmetric inflammatory changes. These could represent areas of atypical infection; focal atelectasis infarction or evolving consolidation among other possibilities.   11-15-21: scrotal ultrasounds No evidence of intratesticular   mass or torsion bilateral  No evidence of varicoceles bilateral  Small sized right hydrocele Bilateral epididymal head is enlarges may represent epididymitis    TODAY   01-23-22: dexa: t score -1.331     LABS REVIEWED PREVIOUS      08-10-21: PSA 0.17 testosterone <3 08-16-21: wbc 6.0; hgb 12.0; hct 37.1; mcv 89.0 plt 181; glucose 98; bun 40; creat 1.46; k+ 5.7; na++ 138; ca 9.3 GFR>46 protein 5.8; albumin 3.4; tsh 0.620 08-24-21: glucose 104; bun 46; creat 2.07; k+ 4.7; na++ 141; ca 8.5; GFR 30  08-30-21: vitamin B12  205  09-10-21: wbc 8.5; hgb 12.8; hct 38.5; mcv 87.3; plt 167; glucose 131; bun 25; creat 1.20; k+ 3.9; na++ 140; ca 9.6; GFR 58; iron 38; tibc 261; folate 14.2; CRP 6.7; d-dimer 0.43 09-17-21: CRP <0.5 10-18-21: wbc 6.1; hgb 13.3; hct 41.5; mcv 89.7 plt 190; glucose 91; bun 25; creat 0.95; k+ 3.4; na++ 142; ca 9.3; gfr >60; protein 6.0; albumin 3.5 01-31-22: glucose 100; bun 20; creat 0.94; k+ 3.5; na++ 143; ca 9.5 gfr >60 02-11-22: glucose 102; bun 19; creat 0.91; k+ 3.3; na++ 141; ca 9.3; gfr >60 02-18-22: glucose 114; bun 27; creat 1.10; k+ 3.8; na++ 141; ca 9.3; gfr >60 04-01-22; wbc 6.9; hgb 13.3; hct  41.0; mcv 86.9 plt 174; glucose 115; bun 37; creat 1.07; k+ 4.1; na++ 144; ca 9.7;  gfr >60; protein 6.0 albumin 3.5; tsh 0.157 vitamin B12; 856;  04-02-22: free t4: 0.80; RPR negative 04-05-22: wbc 6.6; hgb 12.8; hct 39.1; mcv 86.3 plt 196; glucose 140; bun 28; creat 1.14; k+ 4.2; na++ 142; ca 9.9; gfr >60; protein 6.4; albumin 3.7 05-29-22: wb 6.7; hgb 13.8; hct 42.8; mcv 85.4 plt 205; glucose 142; bun 18; creat 1.09; k+ 3.5; na++ 141; ca 9.5; gfr >60     NO NEW LABS.      ASSESSMENT/ PLAN:  Essential hypertension BP: (!) 141/79 as of yesterday. Poorly controlled on drug regimen Do not want to increase medication burden, will continue to monitor. Metoprolol 25 BID, Hydral 10 TID, Dilt 300 mg daily, Lisinopril 40 mg daily, isosorbide dinitrate 30 mg BID   Gastroesophageal reflux disease without esophagitis Continue PPI for comfort   Chronic constipation Miralax for comfort, most recent BM documented over last couple of days.   Vascular dementia without behavioral disturbance (HCC) Currently on Namenda, Ropinirole, and effexor (See dosages in H&P). Patient without behavioral disturbance.   Overactive bladder Gemtesa   Failure to thrive in adult Worsening function 2/2 dementia. Continuing with comfort care measures, PRN medications to assist with comfort. Assist with meals, cutting meat for eating. See MOST form above in H&P.   Hypokalemia Requires intermittent K supplementation. Most recent 05/2022 3.5   New onset seizure ([place]) Keppra 250 BID, no new seizure like activity, seizure and fall precautions.     Erskine Emery PGY-2 [facility]

## 2022-08-19 NOTE — Assessment & Plan Note (Signed)
Currently on Namenda, Ropinirole, and effexor (See dosages in H&P). Patient without behavioral disturbance.

## 2022-08-19 NOTE — Assessment & Plan Note (Signed)
Continue PPI for comfort

## 2022-08-19 NOTE — Assessment & Plan Note (Signed)
Gemtesa

## 2022-08-19 NOTE — Assessment & Plan Note (Signed)
Requires intermittent K supplementation. Most recent 05/2022 3.5

## 2022-08-30 NOTE — Progress Notes (Signed)
I saw the patient with the resident and discussed the patient's assessment and plan with the resident.

## 2022-09-03 ENCOUNTER — Non-Acute Institutional Stay (SKILLED_NURSING_FACILITY): Payer: Medicare Other | Admitting: Internal Medicine

## 2022-09-03 ENCOUNTER — Encounter: Payer: Self-pay | Admitting: Internal Medicine

## 2022-09-03 DIAGNOSIS — R627 Adult failure to thrive: Secondary | ICD-10-CM

## 2022-09-03 DIAGNOSIS — E441 Mild protein-calorie malnutrition: Secondary | ICD-10-CM | POA: Diagnosis not present

## 2022-09-03 DIAGNOSIS — N1832 Chronic kidney disease, stage 3b: Secondary | ICD-10-CM

## 2022-09-03 DIAGNOSIS — F0150 Vascular dementia without behavioral disturbance: Secondary | ICD-10-CM

## 2022-09-03 DIAGNOSIS — I10 Essential (primary) hypertension: Secondary | ICD-10-CM

## 2022-09-03 NOTE — Assessment & Plan Note (Signed)
Most recent albumin was low normal at 3.7 and total protein minimally reduced to 6.4.  Serially these values have improved.  Nutritionist continues to follow at the SNF.

## 2022-09-03 NOTE — Assessment & Plan Note (Addendum)
He has had a 6 pound weight loss since January.  He is now nonambulatory and requires help with all ADLs.  Comfort care designation is appropriate.  I shall discuss with NP whether any lab screenings might be of benefit.

## 2022-09-03 NOTE — Assessment & Plan Note (Addendum)
He can provide no history and cannot follow commands.  He could not give me the number of his room.  This represents progression of his neurocognitive issues. TFTs & B12 have been normal. No VDRL/RPR on record.

## 2022-09-03 NOTE — Progress Notes (Signed)
   NURSING HOME LOCATION:  [facility] ROOM NUMBER:  [id number]  CODE STATUS:  DNR  PCP:  Ok Edwards NP  This is a nursing facility follow up visit of chronic medical diagnoses & to document compliance with Regulation 483.30 (c) in The [place] Manual Phase 2 which mandates caregiver visit ( visits can alternate among physician, PA or NP as per statutes) within 10 days of 30 days / 60 days/ 90 days post admission to SNF date    Interim medical record and care since last SNF visit was updated with review of diagnostic studies and change in clinical status since last visit were documented.  HPI: He is a permanent resident of the facility with medical diagnoses of history of prostate cancer, essential hypertension, GAD, GERD, dyslipidemia, interstitial lung disease, history of peptic ulcer disease, OAB, and restless leg syndrome. Most recent labs were performed 06/24/2022 and revealed normal thyroid function tests and hemoglobin/hematocrit.  Despite his GU history; renal function is stable and stage II.  Review of systems: I had made attempts to see him earlier but he was in rehab on 1 occasion and then at lunch.  I found him at the nurses station and took him back to his room.  He could not tell me his room number.  In fact he was essentially nonverbal with only very weak incomplete responses.  He had difficulty following commands so intraoral exam was limited and strength could not be assessed. I reviewed the NP note of 08/14/2022.  She reported global decline in status.  He apparently now requires assistance with meals and meat mostly chopped up.  Weight is down at least 6 pounds since January.  Comfort care had been initiated. He is now nonambulatory due to multiple falls.  Physical exam:  Pertinent or positive findings: As noted he was essentially nonverbal.  He stared blankly ahead.  Pattern alopecia is present.  As noted his voice is very weak and speech was almost  indiscernible.  As noted intraoral exam was limited.  The maxilla may be edentulous.  He has a few lower mandibular teeth.  Heart rhythm was regular except for an occasional premature.  Breath sounds were decreased.  Abdomen is protuberant.  Pedal pulses are not palpable.  Limb atrophy is present with interosseous wasting.  He has mild intermittent myoclonic jerking.  General appearance:  no acute distress, increased work of breathing is present.   Lymphatic: No lymphadenopathy about the head, neck, axilla. Eyes: No conjunctival inflammation or lid edema is present. There is no scleral icterus. Ears:  External ear exam shows no significant lesions or deformities.   Nose:  External nasal examination shows no deformity or inflammation. Nasal mucosa are pink and moist without lesions, exudates Neck:  No thyromegaly, masses, tenderness noted.    Heart:  No gallop, murmur, click, rub .  Lungs: without wheezes, rhonchi, rales, rubs. Abdomen: Bowel sounds are normal. Abdomen is soft and nontender with no organomegaly, hernias, masses. GU: Deferred  Extremities:  No cyanosis, clubbing, edema  Neurologic exam :Balance, Rhomberg, finger to nose testing could not be completed due to clinical state Deep tendon reflexes are equal Skin: Warm & dry w/o tenting. No significant lesions or rash.  See summary under each active problem in the Problem List with associated updated therapeutic plan

## 2022-09-03 NOTE — Assessment & Plan Note (Signed)
>>  ASSESSMENT AND PLAN FOR CHRONIC KIDNEY DISEASE, STAGE II (MILD) WRITTEN ON 09/03/2022 11:40 AM BY HOPPER, Titus Dubin, MD  Current renal function reveals stage II CKD.  No indication for change in meds or dosages.

## 2022-09-03 NOTE — Patient Instructions (Signed)
See assessment and plan under each diagnosis in the problem list and acutely for this visit 

## 2022-09-03 NOTE — Assessment & Plan Note (Signed)
Current renal function reveals stage II CKD.  No indication for change in meds or dosages.

## 2022-09-03 NOTE — Assessment & Plan Note (Signed)
Blood pressure control is adequate on 3 agents.  Continue to monitor.

## 2022-09-11 ENCOUNTER — Encounter: Payer: Self-pay | Admitting: Adult Health

## 2022-09-11 ENCOUNTER — Non-Acute Institutional Stay (SKILLED_NURSING_FACILITY): Payer: Medicare Other | Admitting: Adult Health

## 2022-09-11 DIAGNOSIS — R634 Abnormal weight loss: Secondary | ICD-10-CM

## 2022-09-11 DIAGNOSIS — F0150 Vascular dementia without behavioral disturbance: Secondary | ICD-10-CM | POA: Diagnosis not present

## 2022-09-11 DIAGNOSIS — R627 Adult failure to thrive: Secondary | ICD-10-CM | POA: Diagnosis not present

## 2022-09-11 NOTE — Progress Notes (Signed)
Location:  [facility] Room Number: 156-D Place of Service:  SNF (31)   CODE STATUS: DNR  Allergies  Allergen Reactions   Hctz [Hydrochlorothiazide]     hypokalemia   Xanax [Alprazolam] Other (See Comments)    Causes hallucinations     Chief Complaint  Patient presents with   Acute Visit    Weight loss.    HPI:  He is losing weight. His weight 6 months ago was 169.6 pounds his April weight is 148.4 pounds; he has lost 12.5 pounds in one month. His family is wanting comfort care only; no IVF; no abt; tube feeding. He is being seen by OT for self feeding and ST for dysphagia; he has been choking on water. He now requires assist for feeding. There are no reports of pain present. His appetite is very poor.   Past Medical History:  Diagnosis Date   Anxiety    Cancer    prostate   Chronic chest wall pain    Closed displaced fracture of right femoral neck 03/09/2020   9/30-10/10/2019 minimally displaced right femoral neck fracture sustained in a mechanical fall  Cannulated right hip pinning by Dr. Fuller Canada 10/1  Full dose aspirin prophylaxis through 11/5 with subsequent dosage of 81 mg daily.   Essential hypertension, benign 06/20/2016   GAD (generalized anxiety disorder)    GERD (gastroesophageal reflux disease)    H/O echocardiogram 2016   normal   Heart murmur    "leaking heart valve"   High cholesterol 06/20/2016   HOH (hard of hearing)    left   Hx of falling    Interstitial pulmonary disease    Lower GI bleed    MDD (major depressive disorder)    Murmur, cardiac    Poor historian    Prostate cancer    PUD (peptic ulcer disease)    RLS (restless legs syndrome)    Shortness of breath    Wrist fracture 12/16/2011    Past Surgical History:  Procedure Laterality Date   CHOLECYSTECTOMY N/A 06/28/2020   Procedure: LAPAROSCOPIC CHOLECYSTECTOMY;  Surgeon: Lucretia Roers, MD;  Location: [facility];  Service: General;  Laterality: N/A;  pt  in [facility]   COLONOSCOPY  2005   Rehman: Sigmoid colon diverticulosis, moderate sized external hemorrhoids. A few focal areas of mucosal erythema felt to be nonspecific.   ESOPHAGOGASTRODUODENOSCOPY     ESOPHAGOGASTRODUODENOSCOPY N/A 07/18/2016   Rehman: normal exam except duodenal scar. h.pylori serologies negative   FLEXIBLE SIGMOIDOSCOPY N/A 06/11/2020   Procedure: FLEXIBLE SIGMOIDOSCOPY;  Surgeon: Lanelle Bal, DO;  Location: [facility];  Service: Endoscopy;  Laterality: N/A;   HARDWARE REMOVAL Right 03/20/2021   Procedure: HARDWARE REMOVAL right hip;  Surgeon: Vickki Hearing, MD;  Location: [facility];  Service: Orthopedics;  Laterality: Right;   HIP PINNING,CANNULATED Right 03/10/2020   Procedure: INTERNAL FIXATION RIGHT HIP;  Surgeon: Vickki Hearing, MD;  Location: [facility];  Service: Orthopedics;  Laterality: Right;   ORIF WRIST FRACTURE  12/19/2011   Procedure: OPEN REDUCTION INTERNAL FIXATION (ORIF) WRIST FRACTURE;  Surgeon: Vickki Hearing, MD;  Location: [facility];  Service: Orthopedics;  Laterality: Right;   prostate cancer     Diagnosed in the 90s   TONSILLECTOMY      Social History   Socioeconomic History   Marital status: Widowed    Spouse name: Not on file   Number of children: Not on file   Years of education: Not  on file   Highest education level: Not on file  Occupational History   Occupation: retired    Associate Professor: RETIRED  Tobacco Use   Smoking status: Never   Smokeless tobacco: Never  Vaping Use   Vaping Use: Never used  Substance and Sexual Activity   Alcohol use: No   Drug use: No   Sexual activity: Not Currently  Other Topics Concern   Not on file  Social History Narrative   Is long term resident of [facility]    Social Determinants of Health   Financial Resource Strain: Not on file  Food Insecurity: Not on file  Transportation Needs: Not on file  Physical Activity: Not on file  Stress: Not on file  Social Connections: Not on file   Intimate Partner Violence: Not on file   Family History  Problem Relation Age of Onset   Cancer Other    Heart attack Mother    Cancer Brother    Heart attack Brother    Heart disease Sister        by pass      VITAL SIGNS BP (!) 148/74   Pulse 78   Temp (!) 97.4 F (36.3 C)   Resp 20   Ht 5\' 9"  (1.753 m)   Wt 148 lb 6.4 oz (67.3 kg)   SpO2 96%   BMI 21.91 kg/m   Outpatient Encounter Medications as of 09/11/2022  Medication Sig   acetaminophen (TYLENOL) 500 MG tablet Take 500 mg by mouth every 6 (six) hours. Max 3000mg  in 24 hour period   [employer] (VENELEX) OINT Apply 1 application  topically as directed. Every shift, Day, evening, and night.   dilTIAZem HCl ER 300 MG TB24 Take 1 capsule by mouth daily.   hydrALAZINE (APRESOLINE) 10 MG tablet Take 10 mg by mouth 3 (three) times daily. Hypertensive chronic kidney disease with stage 1 through stage 4 chronic kidney disease, or unspecified chronic kidney disease   isosorbide dinitrate (ISORDIL) 30 MG tablet Take 30 mg by mouth 2 (two) times daily.   levETIRAcetam (KEPPRA) 250 MG tablet Take 250 mg by mouth 2 (two) times daily.   levETIRAcetam (KEPPRA) 500 MG tablet Take 500 mg by mouth 2 (two) times daily.   lidocaine (LMX) 4 % cream Apply 1 Application topically daily at 12 noon.   lisinopril (ZESTRIL) 40 MG tablet Take 40 mg by mouth daily. Hypertensive chronic kidney disease with stage 1 through stage 4 chronic kidney disease, or unspecified chronic kidney disease   Melatonin 10 MG TABS Take 10 mg by mouth at bedtime.   metoprolol tartrate (LOPRESSOR) 25 MG tablet Take 1 tablet (25 mg total) by mouth 2 (two) times daily.   omeprazole (PRILOSEC) 20 MG capsule Take 1 capsule (20 mg total) by mouth daily before breakfast.   polyethylene glycol (MIRALAX / GLYCOLAX) 17 g packet Take 17 g by mouth 2 (two) times daily.   potassium chloride SA (KLOR-CON) 20 MEQ tablet Take 40 mEq by mouth 2 (two) times daily.    rOPINIRole (REQUIP) 0.25 MG tablet Take 0.25 mg by mouth at bedtime.   saline (AYR) GEL Place 1 Application into both nostrils every 6 (six) hours as needed.   UNABLE TO FIND Diet:Regular   UNABLE TO FIND Med Name:  120 ml MEDPASS BID daily to support weight stability Twice A Day Between Meals; 09:00 AM, 09:00 PM   venlafaxine (EFFEXOR) 75 MG tablet Take 75 mg by mouth 2 (two) times daily with  a meal.   Vibegron (GEMTESA) 75 MG TABS Take 1 tablet by mouth daily as needed.   [DISCONTINUED] levETIRAcetam (KEPPRA) 250 MG tablet Take 250 mg by mouth 2 (two) times daily.   [DISCONTINUED] vitamin B-12 (CYANOCOBALAMIN) 1000 MCG tablet Take 1,000 mcg by mouth daily. for vitamin B 12 level 205   No facility-administered encounter medications on file as of 09/11/2022.     SIGNIFICANT DIAGNOSTIC EXAMS  PREVIOUS   09-10-21: chest x-ray: patch asymmetric inflammatory changes. These could represent areas of atypical infection; focal atelectasis infarction or evolving consolidation among other possibilities.  11-15-21: scrotal ultrasounds No evidence of intratesticular   mass or torsion bilateral  No evidence of varicoceles bilateral  Small sized right hydrocele Bilateral epididymal head is enlarges may represent epididymitis   TODAY  01-23-22: dexa: t score -1.331   LABS REVIEWED PREVIOUS     09-10-21: wbc 8.5; hgb 12.8; hct 38.5; mcv 87.3; plt 167; glucose 131; bun 25; creat 1.20; k+ 3.9; na++ 140; ca 9.6; GFR 58; iron 38; tibc 261; folate 14.2; CRP 6.7; d-dimer 0.43 09-17-21: CRP <0.5 10-18-21: wbc 6.1; hgb 13.3; hct 41.5; mcv 89.7 plt 190; glucose 91; bun 25; creat 0.95; k+ 3.4; na++ 142; ca 9.3; gfr >60; protein 6.0; albumin 3.5 01-31-22: glucose 100; bun 20; creat 0.94; k+ 3.5; na++ 143; ca 9.5 gfr >60 02-11-22: glucose 102; bun 19; creat 0.91; k+ 3.3; na++ 141; ca 9.3; gfr >60 02-18-22: glucose 114; bun 27; creat 1.10; k+ 3.8; na++ 141; ca 9.3; gfr >60 04-01-22; wbc 6.9; hgb 13.3; hct 41.0; mcv 86.9  plt 174; glucose 115; bun 37; creat 1.07; k+ 4.1; na++ 144; ca 9.7; gfr >60; protein 6.0 albumin 3.5; tsh 0.157 vitamin B12; 856;  04-02-22: free t4: 0.80; RPR negative 04-05-22: wbc 6.6; hgb 12.8; hct 39.1; mcv 86.3 plt 196; glucose 140; bun 28; creat 1.14; k+ 4.2; na++ 142; ca 9.9; gfr >60; protein 6.4; albumin 3.7 05-29-22: wb 6.7; hgb 13.8; hct 42.8; mcv 85.4 plt 205; glucose 142; bun 18; creat 1.09; k+ 3.5; na++ 141; ca 9.5; gfr >60    NO NEW LABS.   Review of Systems  Reason unable to perform ROS: unable to fully participate.    Physical Exam Constitutional:      General: He is not in acute distress.    Appearance: He is underweight. He is not diaphoretic.  Neck:     Thyroid: No thyromegaly.  Cardiovascular:     Rate and Rhythm: Normal rate and regular rhythm.     Pulses: Normal pulses.     Heart sounds: Normal heart sounds.  Pulmonary:     Effort: Pulmonary effort is normal. No respiratory distress.     Breath sounds: Normal breath sounds.  Abdominal:     General: Bowel sounds are normal. There is no distension.     Palpations: Abdomen is soft.     Tenderness: There is no abdominal tenderness.  Musculoskeletal:        General: Normal range of motion.     Cervical back: Neck supple.     Right lower leg: No edema.     Left lower leg: No edema.  Lymphadenopathy:     Cervical: No cervical adenopathy.  Skin:    General: Skin is warm and dry.  Neurological:     Mental Status: He is alert. Mental status is at baseline.  Psychiatric:        Mood and Affect: Mood normal.  ASSESSMENT/ PLAN:  TODAY  Failure to thrive in adult Weight loss unintentional Vascular dementia without behavioral disturbance  At this time; will not make other changes; he has had a 13 % weight loss in the past 6 months. The focus of his care is for comfort only; he is not any distress; does not appear to be in any pain.    Synthia Innocent NP [facility]  call [phone]

## 2022-09-16 DIAGNOSIS — R634 Abnormal weight loss: Secondary | ICD-10-CM | POA: Insufficient documentation

## 2022-10-03 ENCOUNTER — Encounter: Payer: Self-pay | Admitting: Adult Health

## 2022-10-03 NOTE — Progress Notes (Signed)
Location:  [facility] Room Number: 156 Place of Service:  SNF (31)   CODE STATUS: dnr   Allergies  Allergen Reactions   Hctz [Hydrochlorothiazide]     hypokalemia   Xanax [Alprazolam] Other (See Comments)    Causes hallucinations     Chief Complaint  Patient presents with   Acute Visit    Care plan meeting    HPI:  We have come together for his care plan meeting.  BIMS 7/15 mood 9/30: decreased energy; nervous; depression; stated would get family to attack staff. Is nonambulatory with multiple falls with minor injuries. He is dependent care with his adls needs. He is incontinent of bladder and bowel. Dietary: weight is 148.8 pounds down 13% over the past 6 months. He requires assist with meals; regular diet appetite 25-50%. Therapy: stand pivot dependent; bed mobility max assist nonambulatory wheelchair max assist without progress last day 10-09-22. Activities: spends time in hallway people watching. He continues to be followed for his chronic illnesses including:  Aortic atherosclerosis  Benign hypertension with chronic kidney disease stage III  Interstitial lung disease  Vascular dementia with behavioral disturbance  Past Medical History:  Diagnosis Date   Anxiety    Cancer    prostate   Chronic chest wall pain    Closed displaced fracture of right femoral neck 03/09/2020   9/30-10/10/2019 minimally displaced right femoral neck fracture sustained in a mechanical fall  Cannulated right hip pinning by Dr. Fuller Canada 10/1  Full dose aspirin prophylaxis through 11/5 with subsequent dosage of 81 mg daily.   Essential hypertension, benign 06/20/2016   GAD (generalized anxiety disorder)    GERD (gastroesophageal reflux disease)    H/O echocardiogram 2016   normal   Heart murmur    "leaking heart valve"   High cholesterol 06/20/2016   HOH (hard of hearing)    left   Hx of falling    Interstitial pulmonary disease    Lower GI bleed    MDD (major  depressive disorder)    Murmur, cardiac    Poor historian    Prostate cancer    PUD (peptic ulcer disease)    RLS (restless legs syndrome)    Shortness of breath    Wrist fracture 12/16/2011    Past Surgical History:  Procedure Laterality Date   CHOLECYSTECTOMY N/A 06/28/2020   Procedure: LAPAROSCOPIC CHOLECYSTECTOMY;  Surgeon: Lucretia Roers, MD;  Location: [facility];  Service: General;  Laterality: N/A;  pt in [facility]   COLONOSCOPY  2005   Rehman: Sigmoid colon diverticulosis, moderate sized external hemorrhoids. A few focal areas of mucosal erythema felt to be nonspecific.   ESOPHAGOGASTRODUODENOSCOPY     ESOPHAGOGASTRODUODENOSCOPY N/A 07/18/2016   Rehman: normal exam except duodenal scar. h.pylori serologies negative   FLEXIBLE SIGMOIDOSCOPY N/A 06/11/2020   Procedure: FLEXIBLE SIGMOIDOSCOPY;  Surgeon: Lanelle Bal, DO;  [facility]: [facility];  Service: Endoscopy;  Laterality: N/A;   HARDWARE REMOVAL Right 03/20/2021   Procedure: HARDWARE REMOVAL right hip;  Surgeon: Vickki Hearing, MD;  Location: [facility];  Service: Orthopedics;  Laterality: Right;   HIP PINNING,CANNULATED Right 03/10/2020   Procedure: INTERNAL FIXATION RIGHT HIP;  Surgeon: Vickki Hearing, MD;  Location: [facility];  Service: Orthopedics;  Laterality: Right;   ORIF WRIST FRACTURE  12/19/2011   Procedure: OPEN REDUCTION INTERNAL FIXATION (ORIF) WRIST FRACTURE;  Surgeon: Vickki Hearing, MD;  Location: [facility];  Service: Orthopedics;  Laterality: Right;  prostate cancer     Diagnosed in the 90s   TONSILLECTOMY      Social History   Socioeconomic History   Marital status: Widowed    Spouse name: Not on file   Number of children: Not on file   Years of education: Not on file   Highest education level: Not on file  Occupational History   Occupation: retired    Associate Professor: RETIRED  Tobacco Use   Smoking status: Never   Smokeless tobacco: Never  Vaping Use   Vaping Use: Never used  Substance  and Sexual Activity   Alcohol use: No   Drug use: No   Sexual activity: Not Currently  Other Topics Concern   Not on file  Social History Narrative   Is long term resident of [facility]    Social Determinants of Health   Financial Resource Strain: Not on file  Food Insecurity: Not on file  Transportation Needs: Not on file  Physical Activity: Not on file  Stress: Not on file  Social Connections: Not on file  Intimate Partner Violence: Not on file   Family History  Problem Relation Age of Onset   Cancer Other    Heart attack Mother    Cancer Brother    Heart attack Brother    Heart disease Sister        by pass      VITAL SIGNS BP 128/60   Pulse 80   Temp (!) 97 F (36.1 C)   Resp 20   Ht 5\' 9"  (1.753 m)   Wt 148 lb 6.4 oz (67.3 kg)   SpO2 94%   BMI 21.91 kg/m   Outpatient Encounter Medications as of 10/04/2022  Medication Sig   acetaminophen (TYLENOL) 500 MG tablet Take 500 mg by mouth every 6 (six) hours. Max 3000mg  in 24 hour period   [employer] (VENELEX) OINT Apply 1 application  topically as directed. Every shift, Day, evening, and night.   dilTIAZem HCl ER 300 MG TB24 Take 1 capsule by mouth daily.   hydrALAZINE (APRESOLINE) 10 MG tablet Take 10 mg by mouth 3 (three) times daily. Hypertensive chronic kidney disease with stage 1 through stage 4 chronic kidney disease, or unspecified chronic kidney disease   isosorbide dinitrate (ISORDIL) 30 MG tablet Take 30 mg by mouth 2 (two) times daily.   levETIRAcetam (KEPPRA) 250 MG tablet Take 250 mg by mouth 2 (two) times daily.   levETIRAcetam (KEPPRA) 500 MG tablet Take 500 mg by mouth 2 (two) times daily.   lidocaine (LMX) 4 % cream Apply 1 Application topically daily at 12 noon.   lisinopril (ZESTRIL) 40 MG tablet Take 40 mg by mouth daily. Hypertensive chronic kidney disease with stage 1 through stage 4 chronic kidney disease, or unspecified chronic kidney disease   Melatonin 10 MG TABS Take 10 mg by mouth at  bedtime.   metoprolol tartrate (LOPRESSOR) 25 MG tablet Take 1 tablet (25 mg total) by mouth 2 (two) times daily.   omeprazole (PRILOSEC) 20 MG capsule Take 1 capsule (20 mg total) by mouth daily before breakfast.   polyethylene glycol (MIRALAX / GLYCOLAX) 17 g packet Take 17 g by mouth 2 (two) times daily.   potassium chloride SA (KLOR-CON) 20 MEQ tablet Take 40 mEq by mouth 2 (two) times daily.   rOPINIRole (REQUIP) 0.25 MG tablet Take 0.25 mg by mouth at bedtime.   saline (AYR) GEL Place 1 Application into both nostrils every 6 (six) hours as  needed.   UNABLE TO FIND Diet:Regular   UNABLE TO FIND Med Name:  120 ml MEDPASS BID daily to support weight stability Twice A Day Between Meals; 09:00 AM, 09:00 PM   venlafaxine (EFFEXOR) 75 MG tablet Take 75 mg by mouth 2 (two) times daily with a meal.   Vibegron (GEMTESA) 75 MG TABS Take 1 tablet by mouth daily as needed.   No facility-administered encounter medications on file as of 10/04/2022.     SIGNIFICANT DIAGNOSTIC EXAMS  PREVIOUS   09-10-21: chest x-ray: patch asymmetric inflammatory changes. These could represent areas of atypical infection; focal atelectasis infarction or evolving consolidation among other possibilities.  11-15-21: scrotal ultrasounds No evidence of intratesticular   mass or torsion bilateral  No evidence of varicoceles bilateral  Small sized right hydrocele Bilateral epididymal head is enlarges may represent epididymitis   TODAY  01-23-22: dexa: t score -1.331   LABS REVIEWED PREVIOUS     09-10-21: wbc 8.5; hgb 12.8; hct 38.5; mcv 87.3; plt 167; glucose 131; bun 25; creat 1.20; k+ 3.9; na++ 140; ca 9.6; GFR 58; iron 38; tibc 261; folate 14.2; CRP 6.7; d-dimer 0.43 09-17-21: CRP <0.5 10-18-21: wbc 6.1; hgb 13.3; hct 41.5; mcv 89.7 plt 190; glucose 91; bun 25; creat 0.95; k+ 3.4; na++ 142; ca 9.3; gfr >60; protein 6.0; albumin 3.5 01-31-22: glucose 100; bun 20; creat 0.94; k+ 3.5; na++ 143; ca 9.5 gfr >60 02-11-22:  glucose 102; bun 19; creat 0.91; k+ 3.3; na++ 141; ca 9.3; gfr >60 02-18-22: glucose 114; bun 27; creat 1.10; k+ 3.8; na++ 141; ca 9.3; gfr >60 04-01-22; wbc 6.9; hgb 13.3; hct 41.0; mcv 86.9 plt 174; glucose 115; bun 37; creat 1.07; k+ 4.1; na++ 144; ca 9.7; gfr >60; protein 6.0 albumin 3.5; tsh 0.157 vitamin B12; 856;  04-02-22: free t4: 0.80; RPR negative 04-05-22: wbc 6.6; hgb 12.8; hct 39.1; mcv 86.3 plt 196; glucose 140; bun 28; creat 1.14; k+ 4.2; na++ 142; ca 9.9; gfr >60; protein 6.4; albumin 3.7 05-29-22: wb 6.7; hgb 13.8; hct 42.8; mcv 85.4 plt 205; glucose 142; bun 18; creat 1.09; k+ 3.5; na++ 141; ca 9.5; gfr >60    NO NEW LABS.   Review of Systems  Reason unable to perform ROS: unable to fully participate.    Physical Exam Constitutional:      General: He is not in acute distress.    Appearance: He is underweight. He is not diaphoretic.  Neck:     Thyroid: No thyromegaly.  Cardiovascular:     Rate and Rhythm: Normal rate and regular rhythm.     Pulses: Normal pulses.     Heart sounds: Normal heart sounds.  Pulmonary:     Effort: Pulmonary effort is normal. No respiratory distress.     Breath sounds: Normal breath sounds.  Abdominal:     General: Bowel sounds are normal. There is no distension.     Palpations: Abdomen is soft.     Tenderness: There is no abdominal tenderness.  Musculoskeletal:        General: Normal range of motion.     Cervical back: Neck supple.     Right lower leg: No edema.     Left lower leg: No edema.  Lymphadenopathy:     Cervical: No cervical adenopathy.  Skin:    General: Skin is warm and dry.  Neurological:     Mental Status: He is alert. Mental status is at baseline.  Psychiatric:  Mood and Affect: Mood normal.       ASSESSMENT/ PLAN:  TODAY  Aortic atherosclerosis Benign hypertension with chronic kidney disease stage III Interstitial lung disease Vascular dementia with behavioral disturbance   Will continue  current medications Will continue therapy as directed Will continue to monitor his status.   Time spent with patient: 40 minutes: therapy; overall health status; medications.    Synthia Innocent NP [facility]   call [phone]

## 2022-10-04 ENCOUNTER — Non-Acute Institutional Stay (SKILLED_NURSING_FACILITY): Payer: Medicare Other | Admitting: Adult Health

## 2022-10-04 DIAGNOSIS — I700 Atherosclerosis of aorta: Secondary | ICD-10-CM

## 2022-10-04 DIAGNOSIS — J849 Interstitial pulmonary disease, unspecified: Secondary | ICD-10-CM

## 2022-10-04 DIAGNOSIS — I129 Hypertensive chronic kidney disease with stage 1 through stage 4 chronic kidney disease, or unspecified chronic kidney disease: Secondary | ICD-10-CM | POA: Diagnosis not present

## 2022-10-04 DIAGNOSIS — N183 Chronic kidney disease, stage 3 unspecified: Secondary | ICD-10-CM

## 2022-10-04 DIAGNOSIS — F01518 Vascular dementia, unspecified severity, with other behavioral disturbance: Secondary | ICD-10-CM | POA: Diagnosis not present

## 2022-10-07 ENCOUNTER — Encounter: Payer: Self-pay | Admitting: Adult Health

## 2022-10-07 ENCOUNTER — Non-Acute Institutional Stay (SKILLED_NURSING_FACILITY): Payer: Medicare Other | Admitting: Adult Health

## 2022-10-07 DIAGNOSIS — G2581 Restless legs syndrome: Secondary | ICD-10-CM

## 2022-10-07 DIAGNOSIS — D508 Other iron deficiency anemias: Secondary | ICD-10-CM

## 2022-10-07 DIAGNOSIS — F339 Major depressive disorder, recurrent, unspecified: Secondary | ICD-10-CM | POA: Diagnosis not present

## 2022-10-07 DIAGNOSIS — J849 Interstitial pulmonary disease, unspecified: Secondary | ICD-10-CM | POA: Diagnosis not present

## 2022-10-07 NOTE — Progress Notes (Unsigned)
Location:  [facility] Room Number: 156 D Place of Service:  SNF (31)   CODE STATUS: DNR  Allergies  Allergen Reactions   Hctz [Hydrochlorothiazide]     hypokalemia   Xanax [Alprazolam] Other (See Comments)    Causes hallucinations     Chief Complaint  Patient presents with   Medical Management of Chronic Issues    Routine visit     HPI:    Past Medical History:  Diagnosis Date   Anxiety    Cancer (HCC)    prostate   Chronic chest wall pain    Closed displaced fracture of right femoral neck (HCC) 03/09/2020   9/30-10/10/2019 minimally displaced right femoral neck fracture sustained in a mechanical fall  Cannulated right hip pinning by Dr. Fuller Canada 10/1  Full dose aspirin prophylaxis through 11/5 with subsequent dosage of 81 mg daily.   Essential hypertension, benign 06/20/2016   GAD (generalized anxiety disorder)    GERD (gastroesophageal reflux disease)    H/O echocardiogram 2016   normal   Heart murmur    "leaking heart valve"   High cholesterol 06/20/2016   HOH (hard of hearing)    left   Hx of falling    Interstitial pulmonary disease (HCC)    Lower GI bleed    MDD (major depressive disorder)    Murmur, cardiac    Poor historian    Prostate cancer (HCC)    PUD (peptic ulcer disease)    RLS (restless legs syndrome)    Shortness of breath    Wrist fracture 12/16/2011    Past Surgical History:  Procedure Laterality Date   CHOLECYSTECTOMY N/A 06/28/2020   Procedure: LAPAROSCOPIC CHOLECYSTECTOMY;  Surgeon: Lucretia Roers, MD;  Location: [facility];  Service: General;  Laterality: N/A;  pt in [facility]   COLONOSCOPY  2005   Rehman: Sigmoid colon diverticulosis, moderate sized external hemorrhoids. A few focal areas of mucosal erythema felt to be nonspecific.   ESOPHAGOGASTRODUODENOSCOPY     ESOPHAGOGASTRODUODENOSCOPY N/A 07/18/2016   Rehman: normal exam except duodenal scar. h.pylori serologies negative   FLEXIBLE  SIGMOIDOSCOPY N/A 06/11/2020   Procedure: FLEXIBLE SIGMOIDOSCOPY;  Surgeon: Lanelle Bal, DO;  Location: [facility];  Service: Endoscopy;  Laterality: N/A;   HARDWARE REMOVAL Right 03/20/2021   Procedure: HARDWARE REMOVAL right hip;  Surgeon: Vickki Hearing, MD;  Location: [facility];  Service: Orthopedics;  Laterality: Right;   HIP PINNING,CANNULATED Right 03/10/2020   Procedure: INTERNAL FIXATION RIGHT HIP;  Surgeon: Vickki Hearing, MD;  Location: [facility];  Service: Orthopedics;  Laterality: Right;   ORIF WRIST FRACTURE  12/19/2011   Procedure: OPEN REDUCTION INTERNAL FIXATION (ORIF) WRIST FRACTURE;  Surgeon: Vickki Hearing, MD;  Location: [facility];  Service: Orthopedics;  Laterality: Right;   prostate cancer     Diagnosed in the 90s   TONSILLECTOMY      Social History   Socioeconomic History   Marital status: Widowed    Spouse name: Not on file   Number of children: Not on file   Years of education: Not on file   Highest education level: Not on file  Occupational History   Occupation: retired    Associate Professor: RETIRED  Tobacco Use   Smoking status: Never   Smokeless tobacco: Never  Vaping Use   Vaping Use: Never used  Substance and Sexual Activity   Alcohol use: No   Drug use: No   Sexual activity: Not Currently  Other  Topics Concern   Not on file  Social History Narrative   Is long term resident of [facility]    Social Determinants of Health   Financial Resource Strain: Not on file  Food Insecurity: Not on file  Transportation Needs: Not on file  Physical Activity: Not on file  Stress: Not on file  Social Connections: Not on file  Intimate Partner Violence: Not on file   Family History  Problem Relation Age of Onset   Cancer Other    Heart attack Mother    Cancer Brother    Heart attack Brother    Heart disease Sister        by pass      VITAL SIGNS BP (!) 141/71   Pulse 77   Temp 98.8 F (37.1 C)   Resp 20   Ht 5\' 9"  (1.753 m)   Wt 148 lb 6.4 oz  (67.3 kg)   SpO2 97%   BMI 21.91 kg/m   Outpatient Encounter Medications as of 10/07/2022  Medication Sig   acetaminophen (TYLENOL) 500 MG tablet Take 500 mg by mouth 3 (three) times daily. Max 3000mg  in 24 hour period   [employer] (VENELEX) OINT Apply 1 application  topically as directed. Every shift, Day, evening, and night.   dilTIAZem HCl ER 300 MG TB24 Take 1 capsule by mouth daily.   hydrALAZINE (APRESOLINE) 10 MG tablet Take 10 mg by mouth 3 (three) times daily. Hypertensive chronic kidney disease with stage 1 through stage 4 chronic kidney disease, or unspecified chronic kidney disease   isosorbide dinitrate (ISORDIL) 30 MG tablet Take 30 mg by mouth 2 (two) times daily.   levETIRAcetam (KEPPRA) 250 MG tablet Take 250 mg by mouth 2 (two) times daily.   lisinopril (ZESTRIL) 40 MG tablet Take 40 mg by mouth daily. Hypertensive chronic kidney disease with stage 1 through stage 4 chronic kidney disease, or unspecified chronic kidney disease   melatonin 5 MG TABS Take 5 mg by mouth at bedtime.   metoprolol tartrate (LOPRESSOR) 25 MG tablet Take 1 tablet (25 mg total) by mouth 2 (two) times daily.   Nutritional Supplements (ENSURE ENLIVE PO) Take 1 Can by mouth 2 (two) times daily.   omeprazole (PRILOSEC) 20 MG capsule Take 1 capsule (20 mg total) by mouth daily before breakfast.   polyethylene glycol (MIRALAX / GLYCOLAX) 17 g packet Take 17 g by mouth 2 (two) times daily.   potassium chloride (MICRO-K) 10 MEQ CR capsule Take 40 mEq by mouth daily.   rOPINIRole (REQUIP) 0.25 MG tablet Take 0.25 mg by mouth at bedtime.   saline (AYR) GEL Place 1 Application into both nostrils every 6 (six) hours as needed.   UNABLE TO FIND Diet: DYS 3/Thin Liquids   venlafaxine (EFFEXOR) 75 MG tablet Take 75 mg by mouth 2 (two) times daily with a meal.   [DISCONTINUED] levETIRAcetam (KEPPRA) 500 MG tablet Take 500 mg by mouth 2 (two) times daily.   [DISCONTINUED] lidocaine (LMX) 4 % cream Apply 1  Application topically daily at 12 noon.   [DISCONTINUED] Melatonin 10 MG TABS Take 10 mg by mouth at bedtime.   [DISCONTINUED] potassium chloride SA (KLOR-CON) 20 MEQ tablet Take 40 mEq by mouth 2 (two) times daily.   [DISCONTINUED] UNABLE TO FIND Med Name:  120 ml MEDPASS BID daily to support weight stability Twice A Day Between Meals; 09:00 AM, 09:00 PM   [DISCONTINUED] Vibegron (GEMTESA) 75 MG TABS Take 1 tablet by mouth daily  as needed.   No facility-administered encounter medications on file as of 10/07/2022.     SIGNIFICANT DIAGNOSTIC EXAMS       ASSESSMENT/ PLAN:     Synthia Innocent NP [facility]  Contact [phone] Monday through Friday 8am- 5pm  After hours call [phone]

## 2022-10-17 ENCOUNTER — Non-Acute Institutional Stay (SKILLED_NURSING_FACILITY): Payer: Medicare Other | Admitting: Adult Health

## 2022-10-17 ENCOUNTER — Encounter: Payer: Self-pay | Admitting: Adult Health

## 2022-10-17 ENCOUNTER — Other Ambulatory Visit: Payer: Self-pay | Admitting: Adult Health

## 2022-10-17 DIAGNOSIS — F01518 Vascular dementia, unspecified severity, with other behavioral disturbance: Secondary | ICD-10-CM

## 2022-10-17 DIAGNOSIS — G8929 Other chronic pain: Secondary | ICD-10-CM | POA: Diagnosis not present

## 2022-10-17 DIAGNOSIS — R569 Unspecified convulsions: Secondary | ICD-10-CM

## 2022-10-17 MED ORDER — LORAZEPAM 2 MG/ML IJ SOLN: 1.0000 mg | INTRAMUSCULAR | 0 refills | Status: DC | PRN

## 2022-10-17 NOTE — Progress Notes (Signed)
Location:  [facility] Room Number: 156 D Place of Service:  SNF (31)   CODE STATUS: DNR  Allergies  Allergen Reactions   Hctz [Hydrochlorothiazide]     hypokalemia   Xanax [Alprazolam] Other (See Comments)    Causes hallucinations     Chief Complaint  Patient presents with   Acute Visit    Fall    HPI:  He has had 17 falls since Nov of last year; 8 of which happened since April. He is wanting "to go home" and is "leaving". He was wheeling down the hallway and was upset as he could not find his room. He falls increased infrequency since starting on keppra for presumed seizure activity. He has been declining globally for the past several months. There have been no significant injury.   Past Medical History:  Diagnosis Date   Anxiety    Cancer [facility])    prostate   Chronic chest wall pain    Closed displaced fracture of right femoral neck (HCC) 03/09/2020   9/30-10/10/2019 minimally displaced right femoral neck fracture sustained in a mechanical fall  Cannulated right hip pinning by Dr. Fuller Canada 10/1  Full dose aspirin prophylaxis through 11/5 with subsequent dosage of 81 mg daily.   Essential hypertension, benign 06/20/2016   GAD (generalized anxiety disorder)    GERD (gastroesophageal reflux disease)    H/O echocardiogram 2016   normal   Heart murmur    "leaking heart valve"   High cholesterol 06/20/2016   HOH (hard of hearing)    left   Hx of falling    Interstitial pulmonary disease (HCC)    Lower GI bleed    MDD (major depressive disorder)    Murmur, cardiac    Poor historian    Prostate cancer (HCC)    PUD (peptic ulcer disease)    RLS (restless legs syndrome)    Shortness of breath    Wrist fracture 12/16/2011    Past Surgical History:  Procedure Laterality Date   CHOLECYSTECTOMY N/A 06/28/2020   Procedure: LAPAROSCOPIC CHOLECYSTECTOMY;  Surgeon: Lucretia Roers, MD;  Location: [facility];  Service: General;  Laterality: N/A;   pt in [facility]   COLONOSCOPY  2005   Rehman: Sigmoid colon diverticulosis, moderate sized external hemorrhoids. A few focal areas of mucosal erythema felt to be nonspecific.   ESOPHAGOGASTRODUODENOSCOPY     ESOPHAGOGASTRODUODENOSCOPY N/A 07/18/2016   Rehman: normal exam except duodenal scar. h.pylori serologies negative   FLEXIBLE SIGMOIDOSCOPY N/A 06/11/2020   Procedure: FLEXIBLE SIGMOIDOSCOPY;  Surgeon: Lanelle Bal, DO;  Location: [facility];  Service: Endoscopy;  Laterality: N/A;   HARDWARE REMOVAL Right 03/20/2021   Procedure: HARDWARE REMOVAL right hip;  Surgeon: Vickki Hearing, MD;  Location: [facility];  Service: Orthopedics;  Laterality: Right;   HIP PINNING,CANNULATED Right 03/10/2020   Procedure: INTERNAL FIXATION RIGHT HIP;  Surgeon: Vickki Hearing, MD;  Location: [facility];  Service: Orthopedics;  Laterality: Right;   ORIF WRIST FRACTURE  12/19/2011   Procedure: OPEN REDUCTION INTERNAL FIXATION (ORIF) WRIST FRACTURE;  Surgeon: Vickki Hearing, MD;  Location: [facility];  Service: Orthopedics;  Laterality: Right;   prostate cancer     Diagnosed in the 90s   TONSILLECTOMY      Social History   Socioeconomic History   Marital status: Widowed    Spouse name: Not on file   Number of children: Not on file   Years of education: Not on file  Highest education level: Not on file  Occupational History   Occupation: retired    Associate Professor: RETIRED  Tobacco Use   Smoking status: Never   Smokeless tobacco: Never  Vaping Use   Vaping Use: Never used  Substance and Sexual Activity   Alcohol use: No   Drug use: No   Sexual activity: Not Currently  Other Topics Concern   Not on file  Social History Narrative   Is long term resident of [facility]    Social Determinants of Health   Financial Resource Strain: Not on file  Food Insecurity: Not on file  Transportation Needs: Not on file  Physical Activity: Not on file  Stress: Not on file  Social Connections: Not on file   Intimate Partner Violence: Not on file   Family History  Problem Relation Age of Onset   Cancer Other    Heart attack Mother    Cancer Brother    Heart attack Brother    Heart disease Sister        by pass      VITAL SIGNS BP 136/73   Pulse 74   Ht 5\' 9"  (1.753 m)   Wt 147 lb (66.7 kg)   BMI 21.71 kg/m   Outpatient Encounter Medications as of 10/17/2022  Medication Sig   acetaminophen (TYLENOL) 500 MG tablet Take 500 mg by mouth 3 (three) times daily. Max 3000mg  in 24 hour period   [employer] (VENELEX) OINT Apply 1 application  topically as directed. Every shift, Day, evening, and night.   dilTIAZem HCl ER 300 MG TB24 Take 1 capsule by mouth daily.   hydrALAZINE (APRESOLINE) 10 MG tablet Take 10 mg by mouth 3 (three) times daily. Hypertensive chronic kidney disease with stage 1 through stage 4 chronic kidney disease, or unspecified chronic kidney disease   isosorbide dinitrate (ISORDIL) 30 MG tablet Take 30 mg by mouth 2 (two) times daily.   levETIRAcetam (KEPPRA) 250 MG tablet Take 250 mg by mouth 2 (two) times daily.   lisinopril (ZESTRIL) 40 MG tablet Take 40 mg by mouth daily. Hypertensive chronic kidney disease with stage 1 through stage 4 chronic kidney disease, or unspecified chronic kidney disease   metoprolol tartrate (LOPRESSOR) 25 MG tablet Take 1 tablet (25 mg total) by mouth 2 (two) times daily.   Nutritional Supplements (ENSURE ENLIVE PO) Take 1 Can by mouth 2 (two) times daily.   omeprazole (PRILOSEC) 20 MG capsule Take 1 capsule (20 mg total) by mouth daily before breakfast.   polyethylene glycol (MIRALAX / GLYCOLAX) 17 g packet Take 17 g by mouth 2 (two) times daily.   potassium chloride (MICRO-K) 10 MEQ CR capsule Take 40 mEq by mouth daily.   rOPINIRole (REQUIP) 0.25 MG tablet Take 0.25 mg by mouth at bedtime.   saline (AYR) GEL Place 1 Application into both nostrils every 6 (six) hours as needed.   traZODone (DESYREL) 50 MG tablet Take 25 mg by  mouth at bedtime.   UNABLE TO FIND Diet: DYS 3/Thin Liquids   venlafaxine (EFFEXOR) 75 MG tablet Take 75 mg by mouth 2 (two) times daily with a meal.   [DISCONTINUED] melatonin 5 MG TABS Take 5 mg by mouth at bedtime.   No facility-administered encounter medications on file as of 10/17/2022.     SIGNIFICANT DIAGNOSTIC EXAMS  PREVIOUS   11-15-21: scrotal ultrasound No evidence of intratesticular   mass or torsion bilateral  No evidence of varicoceles bilateral  Small sized right hydrocele Bilateral  epididymal head is enlarges may represent epididymitis   01-23-22: dexa: t score -1.331  NO NEW EXAMS    LABS REVIEWED PREVIOUS      10-18-21: wbc 6.1; hgb 13.3; hct 41.5; mcv 89.7 plt 190; glucose 91; bun 25; creat 0.95; k+ 3.4; na++ 142; ca 9.3; gfr >60; protein 6.0; albumin 3.5 01-31-22: glucose 100; bun 20; creat 0.94; k+ 3.5; na++ 143; ca 9.5 gfr >60 02-11-22: glucose 102; bun 19; creat 0.91; k+ 3.3; na++ 141; ca 9.3; gfr >60 02-18-22: glucose 114; bun 27; creat 1.10; k+ 3.8; na++ 141; ca 9.3; gfr >60 04-01-22; wbc 6.9; hgb 13.3; hct 41.0; mcv 86.9 plt 174; glucose 115; bun 37; creat 1.07; k+ 4.1; na++ 144; ca 9.7; gfr >60; protein 6.0 albumin 3.5; tsh 0.157 vitamin B12; 856;  04-02-22: free t4: 0.80; RPR negative 04-05-22: wbc 6.6; hgb 12.8; hct 39.1; mcv 86.3 plt 196; glucose 140; bun 28; creat 1.14; k+ 4.2; na++ 142; ca 9.9; gfr >60; protein 6.4; albumin 3.7 05-29-22: wb 6.7; hgb 13.8; hct 42.8; mcv 85.4 plt 205; glucose 142; bun 18; creat 1.09; k+ 3.5; na++ 141; ca 9.5; gfr >60    NO NEW LABS.   Review of Systems  Reason unable to perform ROS: unable to fully participate.   Physical Exam Constitutional:      General: He is not in acute distress.    Appearance: He is well-developed. He is not diaphoretic.  Neck:     Thyroid: No thyromegaly.  Cardiovascular:     Rate and Rhythm: Normal rate and regular rhythm.     Pulses: Normal pulses.     Heart sounds: Normal heart sounds.   Pulmonary:     Effort: Pulmonary effort is normal. No respiratory distress.     Breath sounds: Normal breath sounds.  Abdominal:     General: Bowel sounds are normal. There is no distension.     Palpations: Abdomen is soft.     Tenderness: There is no abdominal tenderness.  Musculoskeletal:        General: Normal range of motion.     Cervical back: Neck supple.     Right lower leg: No edema.     Left lower leg: No edema.  Lymphadenopathy:     Cervical: No cervical adenopathy.  Skin:    General: Skin is warm and dry.  Neurological:     Mental Status: He is alert. Mental status is at baseline.  Psychiatric:        Mood and Affect: Mood normal.        ASSESSMENT/ PLAN:  TODAY  New onset seizure Chronic generalized pain Vascular dementia with behavioral disturbance  Will wean off keppra Will begin tylenol 1 gm three times daily Will begin IM ativan for any future seizure activity.    Synthia Innocent NP [facility]  call [phone]

## 2022-10-21 ENCOUNTER — Encounter: Payer: Self-pay | Admitting: Family Medicine

## 2022-10-21 ENCOUNTER — Non-Acute Institutional Stay (SKILLED_NURSING_FACILITY): Payer: Medicare Other | Admitting: Family Medicine

## 2022-10-21 DIAGNOSIS — R569 Unspecified convulsions: Secondary | ICD-10-CM

## 2022-10-21 DIAGNOSIS — G2581 Restless legs syndrome: Secondary | ICD-10-CM

## 2022-10-21 DIAGNOSIS — R634 Abnormal weight loss: Secondary | ICD-10-CM

## 2022-10-21 DIAGNOSIS — F01518 Vascular dementia, unspecified severity, with other behavioral disturbance: Secondary | ICD-10-CM

## 2022-10-21 DIAGNOSIS — N182 Chronic kidney disease, stage 2 (mild): Secondary | ICD-10-CM

## 2022-10-21 DIAGNOSIS — I700 Atherosclerosis of aorta: Secondary | ICD-10-CM

## 2022-10-21 DIAGNOSIS — Z8639 Personal history of other endocrine, nutritional and metabolic disease: Secondary | ICD-10-CM

## 2022-10-21 DIAGNOSIS — G4700 Insomnia, unspecified: Secondary | ICD-10-CM

## 2022-10-21 DIAGNOSIS — F339 Major depressive disorder, recurrent, unspecified: Secondary | ICD-10-CM

## 2022-10-21 DIAGNOSIS — I129 Hypertensive chronic kidney disease with stage 1 through stage 4 chronic kidney disease, or unspecified chronic kidney disease: Secondary | ICD-10-CM

## 2022-10-21 DIAGNOSIS — K219 Gastro-esophageal reflux disease without esophagitis: Secondary | ICD-10-CM

## 2022-10-21 DIAGNOSIS — K5909 Other constipation: Secondary | ICD-10-CM

## 2022-10-21 NOTE — Assessment & Plan Note (Signed)
Established problem Well Controlled.  No signs of complications, medication side effects, or red flags. Continue polyethylene glycol powder 17 g daily

## 2022-10-21 NOTE — Progress Notes (Signed)
Location:  [facility]   Place of Service:   [facility]  This is a nursing facility follow up visit of chronic medical diagnoses & to document compliance with CFR Regulation 483.30 (b) in Requirements for Long Term Care Facilities.   Interim medical record and care since last SNF visit was updated with review of diagnostic studies and change in clinical status since last visit were documented.   CODE STATUS: Do Not Resuscitate(+), MOST (+), DNI except for comfort  Allergies  Allergen Reactions   Hctz [Hydrochlorothiazide]     hypokalemia   Xanax [Alprazolam] Other (See Comments)    Causes hallucinations     Chief Complaint  Patient presents with   Review of patient's total program of care    HPI:  Jeffrey Sherman is a longterm resident of the [facility] with important chronic conditions including hypertensive chronic kidney disease stage 2, involuntary weight loss,  ILD, MDD,    Past Medical History:  Diagnosis Date   Anxiety    Biliary dyskinesia    Cancer (HCC)    prostate   Chronic chest wall pain    Closed displaced fracture of right femoral neck (HCC) 03/09/2020   9/30-10/10/2019 minimally displaced right femoral neck fracture sustained in a mechanical fall  Cannulated right hip pinning by Dr. Fuller Canada 10/1  Full dose aspirin prophylaxis through 11/5 with subsequent dosage of 81 mg daily.   Epididymitis 11/2021   Bilateral [facility]   Essential hypertension, benign 06/20/2016   Failure to thrive in adult 08/14/2022   GAD (generalized anxiety disorder)    GERD (gastroesophageal reflux disease)    H/O echocardiogram 2016   normal   Hallucination 08/31/2021   Heart murmur    "leaking heart valve"   High cholesterol 06/20/2016   HOH (hard of hearing)    left   Hx of falling    Hyperthyroiditis 04/04/2022   Inguinal hernia 01/15/2022   12/17/2021 Urology PA documented small right inguinal hernia which was easily reducible.  General surgery  assessment scheduled for 01/29/2022.  There is a discrepancy between the severity of his complaints related to this and the physical findings.  He would be a high risk surgical candidate because of advanced age and other comorbidities.   Interstitial pulmonary disease (HCC)    Iron deficiency anemia due to dietary causes 03/22/2020   Low serum thyroid stimulating hormone (TSH) 08/17/2021   Lower GI bleed    MDD (major depressive disorder)    Murmur, cardiac    Poor historian    Prostate cancer (HCC)    PUD (peptic ulcer disease)    Right inguinal pain 01/29/2022   RLS (restless legs syndrome)    SARS-CoV-2 positive 09/13/2021   Shortness of breath    Vascular dementia without behavioral disturbance (HCC) 07/05/2022   Vitamin B 12 deficiency 09/03/2021   Wrist fracture 12/16/2011    Past Surgical History:  Procedure Laterality Date   CHOLECYSTECTOMY N/A 06/28/2020   Procedure: LAPAROSCOPIC CHOLECYSTECTOMY;  Surgeon: Lucretia Roers, MD;  Location: [facility];  Service: General;  Laterality: N/A;  pt in [facility]   COLONOSCOPY  2005   Rehman: Sigmoid colon diverticulosis, moderate sized external hemorrhoids. A few focal areas of mucosal erythema felt to be nonspecific.   ESOPHAGOGASTRODUODENOSCOPY     ESOPHAGOGASTRODUODENOSCOPY N/A 07/18/2016   Rehman: normal exam except duodenal scar. h.pylori serologies negative   FLEXIBLE SIGMOIDOSCOPY N/A 06/11/2020   Procedure: FLEXIBLE SIGMOIDOSCOPY;  Surgeon: Lanelle Bal, DO;  Location: [facility];  Service: Endoscopy;  Laterality: N/A;   HARDWARE REMOVAL Right 03/20/2021   Procedure: HARDWARE REMOVAL right hip;  Surgeon: Vickki Hearing, MD;  Location: [facility];  Service: Orthopedics;  Laterality: Right;   HIP PINNING,CANNULATED Right 03/10/2020   Procedure: INTERNAL FIXATION RIGHT HIP;  Surgeon: Vickki Hearing, MD;  Location: [facility];  Service: Orthopedics;  Laterality: Right;   ORIF WRIST FRACTURE  12/19/2011   Procedure: OPEN  REDUCTION INTERNAL FIXATION (ORIF) WRIST FRACTURE;  Surgeon: Vickki Hearing, MD;  Location: [facility];  Service: Orthopedics;  Laterality: Right;   prostate cancer     Diagnosed in the 90s   TONSILLECTOMY      Social History   Socioeconomic History   Marital status: Widowed    Spouse name: Not on file   Number of children: Not on file   Years of education: Not on file   Highest education level: Not on file  Occupational History   Occupation: retired    Associate Professor: RETIRED  Tobacco Use   Smoking status: Never   Smokeless tobacco: Never  Vaping Use   Vaping Use: Never used  Substance and Sexual Activity   Alcohol use: No   Drug use: No   Sexual activity: Not Currently  Other Topics Concern   Not on file  Social History Narrative   Is long term resident of [facility]    Social Determinants of Health   Financial Resource Strain: Not on file  Food Insecurity: Not on file  Transportation Needs: Not on file  Physical Activity: Not on file  Stress: Not on file  Social Connections: Not on file  Intimate Partner Violence: Not on file   Family History  Problem Relation Age of Onset   Cancer Other    Heart attack Mother    Cancer Brother    Heart attack Brother    Heart disease Sister        by pass      VITAL SIGNS There were no vitals taken for this visit.  Outpatient Encounter Medications as of 10/21/2022  Medication Sig   acetaminophen (TYLENOL) 500 MG tablet Take 1,000 mg by mouth 3 (three) times daily. Max 3000mg  in 24 hour period   dilTIAZem HCl ER 300 MG TB24 Take 1 capsule by mouth daily.   hydrALAZINE (APRESOLINE) 10 MG tablet Take 10 mg by mouth 3 (three) times daily. Hypertensive chronic kidney disease with stage 1 through stage 4 chronic kidney disease, or unspecified chronic kidney disease   isosorbide dinitrate (ISORDIL) 30 MG tablet Take 30 mg by mouth 2 (two) times daily.   levETIRAcetam (KEPPRA) 250 MG tablet Take 250 mg by mouth 2 (two) times daily.    lisinopril (ZESTRIL) 40 MG tablet Take 40 mg by mouth daily. Hypertensive chronic kidney disease with stage 1 through stage 4 chronic kidney disease, or unspecified chronic kidney disease   LORazepam (ATIVAN) 2 MG/ML injection Inject 0.5 mLs (1 mg total) into the muscle every 15 (fifteen) minutes as needed. Give up to 3 doses for seizure activity lasting longer than 30 seconds.   metoprolol tartrate (LOPRESSOR) 25 MG tablet Take 1 tablet (25 mg total) by mouth 2 (two) times daily.   Nutritional Supplements (ENSURE ENLIVE PO) Take 1 Can by mouth 2 (two) times daily.   omeprazole (PRILOSEC) 20 MG capsule Take 1 capsule (20 mg total) by mouth daily before breakfast.   polyethylene glycol (MIRALAX / GLYCOLAX) 17 g packet Take 17 g  by mouth 2 (two) times daily.   potassium chloride (MICRO-K) 10 MEQ CR capsule Take 40 mEq by mouth daily.   rOPINIRole (REQUIP) 0.25 MG tablet Take 0.25 mg by mouth at bedtime.   saline (AYR) GEL Place 1 Application into both nostrils every 6 (six) hours as needed.   traZODone (DESYREL) 50 MG tablet Take 25 mg by mouth at bedtime.   UNABLE TO FIND Diet: DYS 3/Thin Liquids   venlafaxine (EFFEXOR) 75 MG tablet Take 75 mg by mouth 2 (two) times daily with a meal.   [DISCONTINUED] Balsam [place]-Castor Oil (VENELEX) OINT Apply 1 application  topically as directed. Every shift, Day, evening, and night.   No facility-administered encounter medications on file as of 10/21/2022.     SIGNIFICANT DIAGNOSTIC EXAMS  None       ASSESSMENT/ PLAN:  Visit Problem List with A/P  Aortic atherosclerosis (HCC) Established problem. Stable. Patient is at goal of being asymptomatic No signs of complications, medication side effects, or red flags. Continue current medications including Cardizem 300 mg daily, Hydralazine 10 three times a day, Lisinopril 40 mg daily, metoprolol tartrate 25 mg twice a day,    Major depression, recurrent, chronic (HCC) Established problem Uncontrolled.   Patient is not at goal of decreased anxiety Start: Buspirone 5 mg three times a day for augmentation Continue: Effexor 75 mg at 9 am and at 4 pm   Vascular dementia with behavior disturbance (HCC) Established problem Primary behavioral issue is patient attempting to get out of bed without assistance.  This has resulted in multiple falls without injury over last several weeks.  Chronic Medications associated with falls have been eliminated from patient's regiment.  I suspect Jeffrey Fadler lack of safety awareness due to his dementia contributes to his falls.  Non-restrictive safety measures are in place to mitigate the harms from falls during his unsupervised transfers from bed. .   Plan to continue current safety measures of mats around bed and bed at lowest poistion.   Weight loss, unintentional Established problem Weight loss from early March to early April has stabilized at around 147 pounds.  Patient has nutritional supplements.  If weight loss recurs could consider reduction or elimination of Effexor which has associations with anorexia and weight loss.   Chronic constipation Established problem Well Controlled.  No signs of complications, medication side effects, or red flags. Continue polyethylene glycol powder 17 g daily   Gastroesophageal reflux disease without esophagitis Established problem Well Controlled. Patient is at goal of being asymptomatic No signs of complications, medication side effects, or red flags. Continue current omeprazole 20 mg daily   History of hypokalemia Established problem Patient tolerating KCl supplement of 40 mEq daily. Plan recheck serum potassium at next regulatory visit.   New onset seizure [facility]) Established problem that has improved and has meet goal of being without seizures.  Patient in process of titrating off Keppra. Recent reduction of Keppra to 250 mg twice a day Plan to continue to tirtration off Keppra  completely   Restless leg syndrome Established problem Well Controlled. Patient is at goal of asymptomatic. No signs of complications, medication side effects, or red flags. Continue ropinirole 0.25 mg ahs   Insomnia Established problem Well Controlled. Patient is at sleep goals No signs of complications, medication side effects, or red flags. Continue Trazodone 25 mg qhs       Synthia Innocent NP [facility]  Contact [phone] Monday through Friday 8am- 5pm  After hours call [phone]

## 2022-10-21 NOTE — Assessment & Plan Note (Signed)
Established problem Weight loss from early March to early April has stabilized at around 147 pounds.  Patient has nutritional supplements.  If weight loss recurs could consider reduction or elimination of Effexor which has associations with anorexia and weight loss.

## 2022-10-21 NOTE — Assessment & Plan Note (Signed)
Established problem Well Controlled. Patient is at goal of asymptomatic. No signs of complications, medication side effects, or red flags. Continue ropinirole 0.25 mg ahs

## 2022-10-21 NOTE — Assessment & Plan Note (Signed)
Established problem Uncontrolled.  Patient is not at goal of decreased anxiety Start: Buspirone 5 mg three times a day for augmentation Continue: Effexor 75 mg at 9 am and at 4 pm

## 2022-10-21 NOTE — Assessment & Plan Note (Signed)
Established problem Patient tolerating KCl supplement of 40 mEq daily. Plan recheck serum potassium at next regulatory visit.

## 2022-10-21 NOTE — Assessment & Plan Note (Addendum)
Established problem. Stable. Patient is at goal of being asymptomatic No signs of complications, medication side effects, or red flags. Continue current medications including Cardizem 300 mg daily, Hydralazine 10 three times a day, Lisinopril 40 mg daily, metoprolol tartrate 25 mg twice a day,

## 2022-10-21 NOTE — Assessment & Plan Note (Signed)
Established problem Well Controlled. Patient is at sleep goals No signs of complications, medication side effects, or red flags. Continue Trazodone 25 mg qhs

## 2022-10-21 NOTE — Assessment & Plan Note (Signed)
Established problem that has improved and has meet goal of being without seizures.  Patient in process of titrating off Keppra. Recent reduction of Keppra to 250 mg twice a day Plan to continue to tirtration off Keppra completely

## 2022-10-21 NOTE — Assessment & Plan Note (Signed)
Established problem Well Controlled. Patient is at goal of being asymptomatic No signs of complications, medication side effects, or red flags. Continue current omeprazole 20 mg daily

## 2022-10-21 NOTE — Assessment & Plan Note (Signed)
Established problem Primary behavioral issue is patient attempting to get out of bed without assistance.  This has resulted in multiple falls without injury over last several weeks.  Chronic Medications associated with falls have been eliminated from patient's regiment.  I suspect Jeffrey Sherman lack of safety awareness due to his dementia contributes to his falls.  Non-restrictive safety measures are in place to mitigate the harms from falls during his unsupervised transfers from bed. .   Plan to continue current safety measures of mats around bed and bed at lowest poistion.

## 2022-10-22 DIAGNOSIS — G8929 Other chronic pain: Secondary | ICD-10-CM | POA: Insufficient documentation

## 2022-10-31 ENCOUNTER — Encounter: Payer: Self-pay | Admitting: Adult Health

## 2022-10-31 ENCOUNTER — Non-Acute Institutional Stay (SKILLED_NURSING_FACILITY): Payer: Medicare Other | Admitting: Adult Health

## 2022-10-31 DIAGNOSIS — F01518 Vascular dementia, unspecified severity, with other behavioral disturbance: Secondary | ICD-10-CM | POA: Diagnosis not present

## 2022-10-31 DIAGNOSIS — I700 Atherosclerosis of aorta: Secondary | ICD-10-CM | POA: Diagnosis not present

## 2022-10-31 NOTE — Progress Notes (Signed)
Location:  [facility] Room Number: 156 Place of Service:  SNF (31)   CODE STATUS: DNR  Allergies  Allergen Reactions   Hctz [Hydrochlorothiazide]     hypokalemia   Xanax [Alprazolam] Other (See Comments)    Causes hallucinations     Chief Complaint  Patient presents with   Acute Visit    Sundowning    HPI:  He is having more episodes of sun downing. He has had increased agitation. He continues to have falls. There are no indications of pain present. He is on routine tylenol.   Past Medical History:  Diagnosis Date   Anxiety    Biliary dyskinesia    Cancer [facility])    prostate   Chronic chest wall pain    Closed displaced fracture of right femoral neck (HCC) 03/09/2020   9/30-10/10/2019 minimally displaced right femoral neck fracture sustained in a mechanical fall  Cannulated right hip pinning by Dr. Fuller Canada 10/1  Full dose aspirin prophylaxis through 11/5 with subsequent dosage of 81 mg daily.   Epididymitis 11/2021   Bilateral [facility]   Essential hypertension, benign 06/20/2016   Failure to thrive in adult 08/14/2022   GAD (generalized anxiety disorder)    GERD (gastroesophageal reflux disease)    H/O echocardiogram 2016   normal   Hallucination 08/31/2021   Heart murmur    "leaking heart valve"   High cholesterol 06/20/2016   HOH (hard of hearing)    left   Hx of falling    Hyperthyroiditis 04/04/2022   Inguinal hernia 01/15/2022   12/17/2021 Urology PA documented small right inguinal hernia which was easily reducible.  General surgery assessment scheduled for 01/29/2022.  There is a discrepancy between the severity of his complaints related to this and the physical findings.  He would be a high risk surgical candidate because of advanced age and other comorbidities.   Interstitial lung disease (HCC) 03/15/2020   03/12/2020 chronic interstitial changes stable compared to 2019 suggesting chronic interstitial lung disease.     Patient and family deny knowledge of this diagnosis; he has never been on maintenance oxygen.  Patient has never smoked.  He worked for over 2 decades at a [employer] and on one occasion was exposed to hydrochloric acid fumes.   Interstitial pulmonary disease (HCC)    Iron deficiency anemia due to dietary causes 03/22/2020   Low serum thyroid stimulating hormone (TSH) 08/17/2021   Lower GI bleed    MDD (major depressive disorder)    Murmur, cardiac    Poor historian    Prostate cancer (HCC)    PUD (peptic ulcer disease)    Right inguinal pain 01/29/2022   RLS (restless legs syndrome)    SARS-CoV-2 positive 09/13/2021   Shortness of breath    Vascular dementia without behavioral disturbance (HCC) 07/05/2022   Vitamin B 12 deficiency 09/03/2021   Wrist fracture 12/16/2011    Past Surgical History:  Procedure Laterality Date   CHOLECYSTECTOMY N/A 06/28/2020   Procedure: LAPAROSCOPIC CHOLECYSTECTOMY;  Surgeon: Lucretia Roers, MD;  Location: [facility];  Service: General;  Laterality: N/A;  pt in [facility]   COLONOSCOPY  2005   Rehman: Sigmoid colon diverticulosis, moderate sized external hemorrhoids. A few focal areas of mucosal erythema felt to be nonspecific.   ESOPHAGOGASTRODUODENOSCOPY     ESOPHAGOGASTRODUODENOSCOPY N/A 07/18/2016   Rehman: normal exam except duodenal scar. h.pylori serologies negative   FLEXIBLE SIGMOIDOSCOPY N/A 06/11/2020   Procedure: FLEXIBLE SIGMOIDOSCOPY;  Surgeon: Lanelle Bal, DO;  [facility];  Service: Endoscopy;  Laterality: N/A;   HARDWARE REMOVAL Right 03/20/2021   Procedure: HARDWARE REMOVAL right hip;  Surgeon: Vickki Hearing, MD;  Location: [facility];  Service: Orthopedics;  Laterality: Right;   HIP PINNING,CANNULATED Right 03/10/2020   Procedure: INTERNAL FIXATION RIGHT HIP;  Surgeon: Vickki Hearing, MD;  Location: [facility];  Service: Orthopedics;  Laterality: Right;   ORIF WRIST FRACTURE  12/19/2011   Procedure:  OPEN REDUCTION INTERNAL FIXATION (ORIF) WRIST FRACTURE;  Surgeon: Vickki Hearing, MD;  Location: [facility];  Service: Orthopedics;  Laterality: Right;   prostate cancer     Diagnosed in the 90s   TONSILLECTOMY      Social History   Socioeconomic History   Marital status: Widowed    Spouse name: Not on file   Number of children: Not on file   Years of education: Not on file   Highest education level: Not on file  Occupational History   Occupation: retired    Associate Professor: RETIRED  Tobacco Use   Smoking status: Never   Smokeless tobacco: Never  Vaping Use   Vaping Use: Never used  Substance and Sexual Activity   Alcohol use: No   Drug use: No   Sexual activity: Not Currently  Other Topics Concern   Not on file  Social History Narrative   Is long term resident of [facility]    Social Determinants of Health   Financial Resource Strain: Not on file  Food Insecurity: Not on file  Transportation Needs: Not on file  Physical Activity: Not on file  Stress: Not on file  Social Connections: Not on file  Intimate Partner Violence: Not on file   Family History  Problem Relation Age of Onset   Cancer Other    Heart attack Mother    Cancer Brother    Heart attack Brother    Heart disease Sister        by pass      VITAL SIGNS BP 138/80   Pulse 76   Temp (!) 96.8 F (36 C)   Resp 20   Ht 5\' 9"  (1.753 m)   Wt 147 lb 3.2 oz (66.8 kg)   SpO2 98%   BMI 21.74 kg/m   Outpatient Encounter Medications as of 10/31/2022  Medication Sig   acetaminophen (TYLENOL) 500 MG tablet Take 1,000 mg by mouth 3 (three) times daily. Max 3000mg  in 24 hour period   [employer] (VENELEX) OINT Apply topically. Apply to sacrum, coccyx and bilateral buttocks qshift.   busPIRone (BUSPAR) 5 MG tablet Take 5 mg by mouth 3 (three) times daily.   dilTIAZem HCl ER 300 MG TB24 Take 1 capsule by mouth daily.   hydrALAZINE (APRESOLINE) 10 MG tablet Take 10 mg by mouth 3 (three) times daily.  Hypertensive chronic kidney disease with stage 1 through stage 4 chronic kidney disease, or unspecified chronic kidney disease   isosorbide dinitrate (ISORDIL) 30 MG tablet Take 30 mg by mouth 2 (two) times daily.   lisinopril (ZESTRIL) 40 MG tablet Take 40 mg by mouth daily. Hypertensive chronic kidney disease with stage 1 through stage 4 chronic kidney disease, or unspecified chronic kidney disease   LORazepam (ATIVAN) 2 MG/ML injection Inject 0.5 mLs (1 mg total) into the muscle every 15 (fifteen) minutes as needed. Give up to 3 doses for seizure activity lasting longer than 30 seconds.   metoprolol tartrate (LOPRESSOR) 25 MG tablet  Take 1 tablet (25 mg total) by mouth 2 (two) times daily.   Nutritional Supplements (ENSURE ENLIVE PO) Take 1 Can by mouth 2 (two) times daily.   omeprazole (PRILOSEC) 20 MG capsule Take 1 capsule (20 mg total) by mouth daily before breakfast.   polyethylene glycol (MIRALAX / GLYCOLAX) 17 g packet Take 17 g by mouth 2 (two) times daily.   potassium chloride (MICRO-K) 10 MEQ CR capsule Take 40 mEq by mouth daily.   rOPINIRole (REQUIP) 0.25 MG tablet Take 0.25 mg by mouth at bedtime.   saline (AYR) GEL Place 1 Application into both nostrils every 6 (six) hours as needed.   traZODone (DESYREL) 50 MG tablet Take 25 mg by mouth at bedtime.   UNABLE TO FIND Diet: DYS 3/Thin Liquids   venlafaxine (EFFEXOR) 75 MG tablet Take 75 mg by mouth 2 (two) times daily with a meal.   [DISCONTINUED] levETIRAcetam (KEPPRA) 250 MG tablet Take 250 mg by mouth 2 (two) times daily.   No facility-administered encounter medications on file as of 10/31/2022.     SIGNIFICANT DIAGNOSTIC EXAMS   PREVIOUS   11-15-21: scrotal ultrasound No evidence of intratesticular   mass or torsion bilateral  No evidence of varicoceles bilateral  Small sized right hydrocele Bilateral epididymal head is enlarges may represent epididymitis   01-23-22: dexa: t score -1.331  NO NEW EXAMS    LABS  REVIEWED PREVIOUS      10-18-21: wbc 6.1; hgb 13.3; hct 41.5; mcv 89.7 plt 190; glucose 91; bun 25; creat 0.95; k+ 3.4; na++ 142; ca 9.3; gfr >60; protein 6.0; albumin 3.5 01-31-22: glucose 100; bun 20; creat 0.94; k+ 3.5; na++ 143; ca 9.5 gfr >60 02-11-22: glucose 102; bun 19; creat 0.91; k+ 3.3; na++ 141; ca 9.3; gfr >60 02-18-22: glucose 114; bun 27; creat 1.10; k+ 3.8; na++ 141; ca 9.3; gfr >60 04-01-22; wbc 6.9; hgb 13.3; hct 41.0; mcv 86.9 plt 174; glucose 115; bun 37; creat 1.07; k+ 4.1; na++ 144; ca 9.7; gfr >60; protein 6.0 albumin 3.5; tsh 0.157 vitamin B12; 856;  04-02-22: free t4: 0.80; RPR negative 04-05-22: wbc 6.6; hgb 12.8; hct 39.1; mcv 86.3 plt 196; glucose 140; bun 28; creat 1.14; k+ 4.2; na++ 142; ca 9.9; gfr >60; protein 6.4; albumin 3.7 05-29-22: wb 6.7; hgb 13.8; hct 42.8; mcv 85.4 plt 205; glucose 142; bun 18; creat 1.09; k+ 3.5; na++ 141; ca 9.5; gfr >60    NO NEW LABS.   Review of Systems  Reason unable to perform ROS: unable to fully participate.   Physical Exam Constitutional:      General: He is not in acute distress.    Appearance: He is well-developed. He is not diaphoretic.  Neck:     Thyroid: No thyromegaly.  Cardiovascular:     Rate and Rhythm: Normal rate and regular rhythm.     Pulses: Normal pulses.     Heart sounds: Normal heart sounds.  Pulmonary:     Effort: Pulmonary effort is normal. No respiratory distress.     Breath sounds: Normal breath sounds.  Abdominal:     General: Bowel sounds are normal. There is no distension.     Palpations: Abdomen is soft.     Tenderness: There is no abdominal tenderness.  Musculoskeletal:        General: Normal range of motion.     Cervical back: Neck supple.     Right lower leg: No edema.     Left lower leg: No  edema.  Lymphadenopathy:     Cervical: No cervical adenopathy.  Skin:    General: Skin is warm and dry.  Neurological:     Mental Status: He is alert. Mental status is at baseline.  Psychiatric:         Mood and Affect: Mood normal.       ASSESSMENT/ PLAN:   TODAY  Vascular dementia with behavioral disturbance Will not make changes at this time. He continues to decline in his status. Will monitor.   Synthia Innocent NP [facility]  call [phone]

## 2022-11-05 ENCOUNTER — Encounter: Payer: Self-pay | Admitting: Adult Health

## 2022-11-05 ENCOUNTER — Non-Acute Institutional Stay (SKILLED_NURSING_FACILITY): Payer: Medicare Other | Admitting: Adult Health

## 2022-11-05 DIAGNOSIS — G8929 Other chronic pain: Secondary | ICD-10-CM

## 2022-11-05 DIAGNOSIS — E538 Deficiency of other specified B group vitamins: Secondary | ICD-10-CM

## 2022-11-05 DIAGNOSIS — R627 Adult failure to thrive: Secondary | ICD-10-CM

## 2022-11-05 DIAGNOSIS — M8580 Other specified disorders of bone density and structure, unspecified site: Secondary | ICD-10-CM

## 2022-11-05 DIAGNOSIS — R52 Pain, unspecified: Secondary | ICD-10-CM

## 2022-11-05 DIAGNOSIS — F01518 Vascular dementia, unspecified severity, with other behavioral disturbance: Secondary | ICD-10-CM

## 2022-11-05 NOTE — Progress Notes (Unsigned)
Location:  [facility] Room Number: 156-D Place of Service:  SNF (31)   CODE STATUS: DNR  Allergies  Allergen Reactions   Hctz [Hydrochlorothiazide]     hypokalemia   Xanax [Alprazolam] Other (See Comments)    Causes hallucinations     Chief Complaint  Patient presents with   Medical Management of Chronic Issues                 Vitamin B 12 deficiency:  Osteopenia: Left hip pain: Dementia without behavioral disturbance; unspecified dementia type/failure to thrive in adult:      HPI:  He is a 89 year old long term resident of this facility being seen for the management of his chronic illnesses:  Vitamin B 12 deficiency:  Osteopenia: Left hip pain: Dementia without behavioral disturbance; unspecified dementia type/failure to thrive in adult. His weight is stable; he continues to have numerous falls without injury   Past Medical History:  Diagnosis Date   Anxiety    Biliary dyskinesia    Cancer [facility])    prostate   Chronic chest wall pain    Closed displaced fracture of right femoral neck (HCC) 03/09/2020   9/30-10/10/2019 minimally displaced right femoral neck fracture sustained in a mechanical fall  Cannulated right hip pinning by Dr. Fuller Canada 10/1  Full dose aspirin prophylaxis through 11/5 with subsequent dosage of 81 mg daily.   Epididymitis 11/2021   Bilateral [facility]   Essential hypertension, benign 06/20/2016   Failure to thrive in adult 08/14/2022   GAD (generalized anxiety disorder)    GERD (gastroesophageal reflux disease)    H/O echocardiogram 2016   normal   Hallucination 08/31/2021   Heart murmur    "leaking heart valve"   High cholesterol 06/20/2016   HOH (hard of hearing)    left   Hx of falling    Hyperthyroiditis 04/04/2022   Inguinal hernia 01/15/2022   12/17/2021 Urology PA documented small right inguinal hernia which was easily reducible.  General surgery assessment scheduled for 01/29/2022.  There is a  discrepancy between the severity of his complaints related to this and the physical findings.  He would be a high risk surgical candidate because of advanced age and other comorbidities.   Interstitial lung disease (HCC) 03/15/2020   03/12/2020 chronic interstitial changes stable compared to 2019 suggesting chronic interstitial lung disease.    Patient and family deny knowledge of this diagnosis; he has never been on maintenance oxygen.  Patient has never smoked.  He worked for over 2 decades at a [employer] and on one occasion was exposed to hydrochloric acid fumes.   Interstitial pulmonary disease (HCC)    Iron deficiency anemia due to dietary causes 03/22/2020   Low serum thyroid stimulating hormone (TSH) 08/17/2021   Lower GI bleed    MDD (major depressive disorder)    Murmur, cardiac    Poor historian    Prostate cancer (HCC)    PUD (peptic ulcer disease)    Right inguinal pain 01/29/2022   RLS (restless legs syndrome)    SARS-CoV-2 positive 09/13/2021   Shortness of breath    Vascular dementia without behavioral disturbance (HCC) 07/05/2022   Vitamin B 12 deficiency 09/03/2021   Wrist fracture 12/16/2011    Past Surgical History:  Procedure Laterality Date   CHOLECYSTECTOMY N/A 06/28/2020   Procedure: LAPAROSCOPIC CHOLECYSTECTOMY;  Surgeon: Lucretia Roers, MD;  Location: [facility] ORS;  Service: General;  Laterality: N/A;  pt  in [facility]   COLONOSCOPY  2005   Rehman: Sigmoid colon diverticulosis, moderate sized external hemorrhoids. A few focal areas of mucosal erythema felt to be nonspecific.   ESOPHAGOGASTRODUODENOSCOPY     ESOPHAGOGASTRODUODENOSCOPY N/A 07/18/2016   Rehman: normal exam except duodenal scar. h.pylori serologies negative   FLEXIBLE SIGMOIDOSCOPY N/A 06/11/2020   Procedure: FLEXIBLE SIGMOIDOSCOPY;  Surgeon: Lanelle Bal, DO;  Location: [facility] ENDO SUITE;  Service: Endoscopy;  Laterality: N/A;   HARDWARE REMOVAL Right 03/20/2021   Procedure: HARDWARE  REMOVAL right hip;  Surgeon: Vickki Hearing, MD;  Location: [facility] ORS;  Service: Orthopedics;  Laterality: Right;   HIP PINNING,CANNULATED Right 03/10/2020   Procedure: INTERNAL FIXATION RIGHT HIP;  Surgeon: Vickki Hearing, MD;  Location: [facility];  Service: Orthopedics;  Laterality: Right;   ORIF WRIST FRACTURE  12/19/2011   Procedure: OPEN REDUCTION INTERNAL FIXATION (ORIF) WRIST FRACTURE;  Surgeon: Vickki Hearing, MD;  Location: [facility];  Service: Orthopedics;  Laterality: Right;   prostate cancer     Diagnosed in the 90s   TONSILLECTOMY      Social History   Socioeconomic History   Marital status: Widowed    Spouse name: Not on file   Number of children: Not on file   Years of education: Not on file   Highest education level: Not on file  Occupational History   Occupation: retired    Associate Professor: RETIRED  Tobacco Use   Smoking status: Never   Smokeless tobacco: Never  Vaping Use   Vaping Use: Never used  Substance and Sexual Activity   Alcohol use: No   Drug use: No   Sexual activity: Not Currently  Other Topics Concern   Not on file  Social History Narrative   Is long term resident of [facility]    Social Determinants of Health   Financial Resource Strain: Not on file  Food Insecurity: Not on file  Transportation Needs: Not on file  Physical Activity: Not on file  Stress: Not on file  Social Connections: Not on file  Intimate Partner Violence: Not on file   Family History  Problem Relation Age of Onset   Cancer Other    Heart attack Mother    Cancer Brother    Heart attack Brother    Heart disease Sister        by pass      VITAL SIGNS BP 139/75   Pulse 81   Temp (!) 97 F (36.1 C)   Resp 20   Ht 5\' 9"  (1.753 m)   Wt 147 lb 3.2 oz (66.8 kg)   SpO2 96%   BMI 21.74 kg/m   Outpatient Encounter Medications as of 11/05/2022  Medication Sig   acetaminophen (TYLENOL) 500 MG tablet Take 1,000 mg by mouth 3 (three) times daily. Max 3000mg  in 24 hour  period   [employer] (VENELEX) OINT Apply topically. Apply to sacrum, coccyx and bilateral buttocks qshift.   busPIRone (BUSPAR) 5 MG tablet Take 5 mg by mouth 3 (three) times daily.   dilTIAZem HCl ER 300 MG TB24 Take 1 capsule by mouth daily.   hydrALAZINE (APRESOLINE) 10 MG tablet Take 10 mg by mouth 3 (three) times daily. Hypertensive chronic kidney disease with stage 1 through stage 4 chronic kidney disease, or unspecified chronic kidney disease   isosorbide dinitrate (ISORDIL) 30 MG tablet Take 30 mg by mouth 2 (two) times daily.   lisinopril (ZESTRIL) 40 MG tablet Take 40 mg by mouth  daily. Hypertensive chronic kidney disease with stage 1 through stage 4 chronic kidney disease, or unspecified chronic kidney disease   LORazepam (ATIVAN) 2 MG/ML injection Inject 0.5 mLs (1 mg total) into the muscle every 15 (fifteen) minutes as needed. Give up to 3 doses for seizure activity lasting longer than 30 seconds.   metoprolol tartrate (LOPRESSOR) 25 MG tablet Take 1 tablet (25 mg total) by mouth 2 (two) times daily.   Nutritional Supplements (ENSURE ENLIVE PO) Take 1 Can by mouth 2 (two) times daily.   omeprazole (PRILOSEC) 20 MG capsule Take 1 capsule (20 mg total) by mouth daily before breakfast.   polyethylene glycol (MIRALAX / GLYCOLAX) 17 g packet Take 17 g by mouth 2 (two) times daily.   potassium chloride (MICRO-K) 10 MEQ CR capsule Take 40 mEq by mouth daily.   rOPINIRole (REQUIP) 0.25 MG tablet Take 0.25 mg by mouth at bedtime.   saline (AYR) GEL Place 1 Application into both nostrils every 6 (six) hours as needed.   traZODone (DESYREL) 50 MG tablet Take 25 mg by mouth at bedtime.   UNABLE TO FIND Diet: DYS 3/Thin Liquids   venlafaxine (EFFEXOR) 75 MG tablet Take 75 mg by mouth 2 (two) times daily with a meal.   No facility-administered encounter medications on file as of 11/05/2022.     SIGNIFICANT DIAGNOSTIC EXAMS  PREVIOUS   11-15-21: scrotal ultrasounds No evidence of  intratesticular   mass or torsion bilateral  No evidence of varicoceles bilateral  Small sized right hydrocele Bilateral epididymal head is enlarges may represent epididymitis   01-23-22: dexa: t score -1.331  NO NEW EXAMS    LABS REVIEWED PREVIOUS      10-18-21: wbc 6.1; hgb 13.3; hct 41.5; mcv 89.7 plt 190; glucose 91; bun 25; creat 0.95; k+ 3.4; na++ 142; ca 9.3; gfr >60; protein 6.0; albumin 3.5 01-31-22: glucose 100; bun 20; creat 0.94; k+ 3.5; na++ 143; ca 9.5 gfr >60 02-11-22: glucose 102; bun 19; creat 0.91; k+ 3.3; na++ 141; ca 9.3; gfr >60 02-18-22: glucose 114; bun 27; creat 1.10; k+ 3.8; na++ 141; ca 9.3; gfr >60 04-01-22; wbc 6.9; hgb 13.3; hct 41.0; mcv 86.9 plt 174; glucose 115; bun 37; creat 1.07; k+ 4.1; na++ 144; ca 9.7; gfr >60; protein 6.0 albumin 3.5; tsh 0.157 vitamin B12; 856;  04-02-22: free t4: 0.80; RPR negative 04-05-22: wbc 6.6; hgb 12.8; hct 39.1; mcv 86.3 plt 196; glucose 140; bun 28; creat 1.14; k+ 4.2; na++ 142; ca 9.9; gfr >60; protein 6.4; albumin 3.7 05-29-22: wb 6.7; hgb 13.8; hct 42.8; mcv 85.4 plt 205; glucose 142; bun 18; creat 1.09; k+ 3.5; na++ 141; ca 9.5; gfr >60    NO NEW LABS.   Review of Systems  Constitutional:  Negative for malaise/fatigue.  Respiratory:  Negative for cough and shortness of breath.   Cardiovascular:  Negative for chest pain, palpitations and leg swelling.  Gastrointestinal:  Negative for abdominal pain, constipation and heartburn.  Musculoskeletal:  Negative for back pain, joint pain and myalgias.  Skin: Negative.   Neurological:  Negative for dizziness.  Psychiatric/Behavioral:  The patient is not nervous/anxious.    Physical Exam Constitutional:      General: He is not in acute distress.    Appearance: He is well-developed. He is not diaphoretic.  Neck:     Thyroid: No thyromegaly.  Cardiovascular:     Rate and Rhythm: Normal rate and regular rhythm.     Pulses: Normal pulses.  Heart sounds: Normal heart sounds.   Pulmonary:     Effort: Pulmonary effort is normal. No respiratory distress.     Breath sounds: Normal breath sounds.  Abdominal:     General: Bowel sounds are normal. There is no distension.     Palpations: Abdomen is soft.     Tenderness: There is no abdominal tenderness.  Musculoskeletal:        General: Normal range of motion.     Cervical back: Neck supple.     Right lower leg: No edema.     Left lower leg: No edema.  Lymphadenopathy:     Cervical: No cervical adenopathy.  Skin:    General: Skin is warm and dry.  Neurological:     Mental Status: He is alert. Mental status is at baseline.  Psychiatric:        Mood and Affect: Mood normal.         ASSESSMENT/ PLAN:  TODAY  Vitamin B 12 deficiency: will monitor  2. Osteopenia: t score -1.331  3. Left hip pain: will continue tylenol 1 gm three times daily   4. Dementia without behavioral disturbance; unspecified dementia type/failure to thrive in adult: weight is 147 pounds.   PREVIOUS     5. Aortic atherosclerosis (ct 06-09-20)   6. Protein calorie malnutrition: albumin 3.7 will monitor   7. Benign hypertension with chronic kidney disease stage III: b/p 139/75: will continue lopressor 25 mg twice daily; cardizem er 300 mg daily lisinopril 40 mg daily, hydralazine 10 mg three times daily isordil 30 mg twice daily    8. Gastroesophageal reflux disease without esophagitis: will continue prilosec 20 mg daily   9. Prostate cancer/over active bladder: will monitor  no oxybutynin due to history of hip fracture; off myebretiq due to hypertension   10. Seizure:  will continue keppra 250 mg twice daily family has opted not to have neurology consult.    11, Hypokalemia k+ 3.5 will continue k+ 40 meq daily    12. Dyslipidemia: is off zocor due to advanced age  25. Chronic constipation: will continue miralax twice daily   14. CKD stage 3b: bun 18; creat 1.09; gfr>60   15. Major depression recurrent chronic: will  continue effexor 75 mg twice daily is off seroquel  16. RLS will continue requip 0.25 mg nightly   17. Iron deficiency anemia due to dietary intake hgb 13.8 is off iron  18. Interstitial lung disease: will monitor     Synthia Innocent NP [facility]   call [phone]

## 2022-11-14 ENCOUNTER — Encounter: Payer: Self-pay | Admitting: Adult Health

## 2022-11-14 ENCOUNTER — Non-Acute Institutional Stay (SKILLED_NURSING_FACILITY): Payer: Medicare Other | Admitting: Adult Health

## 2022-11-14 DIAGNOSIS — F339 Major depressive disorder, recurrent, unspecified: Secondary | ICD-10-CM | POA: Diagnosis not present

## 2022-11-14 DIAGNOSIS — R634 Abnormal weight loss: Secondary | ICD-10-CM | POA: Diagnosis not present

## 2022-11-14 DIAGNOSIS — F01518 Vascular dementia, unspecified severity, with other behavioral disturbance: Secondary | ICD-10-CM

## 2022-11-14 DIAGNOSIS — R627 Adult failure to thrive: Secondary | ICD-10-CM | POA: Diagnosis not present

## 2022-11-14 NOTE — Progress Notes (Signed)
Location:  [facility] Room Number: 156 Place of Service:  SNF (31)   CODE STATUS: dnr  Allergies  Allergen Reactions   Hctz [Hydrochlorothiazide]     hypokalemia   Xanax [Alprazolam] Other (See Comments)    Causes hallucinations     Chief Complaint  Patient presents with   Acute Visit    Weight loss     HPI:  He is losing weight. January 166.2 pounds to June 154.8 pounds. He continues to have falls. He has delusions: is seeing people. They do not cause him any distress. He is up at night. The trazodone is not effective in helping him to sleep. There are no indications of restless legs. He is on the xr formulation of effexor; will need different formulation as the xr cannot be crushed. He is more anxious in the evening hours.   Past Medical History:  Diagnosis Date   Anxiety    Biliary dyskinesia    Cancer [facility])    prostate   Chronic chest wall pain    Closed displaced fracture of right femoral neck (HCC) 03/09/2020   9/30-10/10/2019 minimally displaced right femoral neck fracture sustained in a mechanical fall  Cannulated right hip pinning by Dr. Fuller Canada 10/1  Full dose aspirin prophylaxis through 11/5 with subsequent dosage of 81 mg daily.   Epididymitis 11/2021   Bilateral [facility]   Essential hypertension, benign 06/20/2016   Failure to thrive in adult 08/14/2022   GAD (generalized anxiety disorder)    GERD (gastroesophageal reflux disease)    H/O echocardiogram 2016   normal   Hallucination 08/31/2021   Heart murmur    "leaking heart valve"   High cholesterol 06/20/2016   HOH (hard of hearing)    left   Hx of falling    Hyperthyroiditis 04/04/2022   Inguinal hernia 01/15/2022   12/17/2021 Urology PA documented small right inguinal hernia which was easily reducible.  General surgery assessment scheduled for 01/29/2022.  There is a discrepancy between the severity of his complaints related to this and the physical findings.   He would be a high risk surgical candidate because of advanced age and other comorbidities.   Interstitial lung disease (HCC) 03/15/2020   03/12/2020 chronic interstitial changes stable compared to 2019 suggesting chronic interstitial lung disease.    Patient and family deny knowledge of this diagnosis; he has never been on maintenance oxygen.  Patient has never smoked.  He worked for over 2 decades at a [employer] and on one occasion was exposed to hydrochloric acid fumes.   Interstitial pulmonary disease (HCC)    Iron deficiency anemia due to dietary causes 03/22/2020   Low serum thyroid stimulating hormone (TSH) 08/17/2021   Lower GI bleed    MDD (major depressive disorder)    Murmur, cardiac    Poor historian    Prostate cancer (HCC)    PUD (peptic ulcer disease)    Right inguinal pain 01/29/2022   RLS (restless legs syndrome)    SARS-CoV-2 positive 09/13/2021   Shortness of breath    Vascular dementia without behavioral disturbance (HCC) 07/05/2022   Vitamin B 12 deficiency 09/03/2021   Wrist fracture 12/16/2011    Past Surgical History:  Procedure Laterality Date   CHOLECYSTECTOMY N/A 06/28/2020   Procedure: LAPAROSCOPIC CHOLECYSTECTOMY;  Surgeon: Lucretia Roers, MD;  Location: [facility] ORS;  Service: General;  Laterality: N/A;  pt in [facility]   COLONOSCOPY  2005   Rehman:  Sigmoid colon diverticulosis, moderate sized external hemorrhoids. A few focal areas of mucosal erythema felt to be nonspecific.   ESOPHAGOGASTRODUODENOSCOPY     ESOPHAGOGASTRODUODENOSCOPY N/A 07/18/2016   Rehman: normal exam except duodenal scar. h.pylori serologies negative   FLEXIBLE SIGMOIDOSCOPY N/A 06/11/2020   Procedure: FLEXIBLE SIGMOIDOSCOPY;  Surgeon: Lanelle Bal, DO;  Location: [facility];  Service: Endoscopy;  Laterality: N/A;   HARDWARE REMOVAL Right 03/20/2021   Procedure: HARDWARE REMOVAL right hip;  Surgeon: Vickki Hearing, MD;  Location: [facility] ORS;  Service:  Orthopedics;  Laterality: Right;   HIP PINNING,CANNULATED Right 03/10/2020   Procedure: INTERNAL FIXATION RIGHT HIP;  Surgeon: Vickki Hearing, MD;  Location: [facility];  Service: Orthopedics;  Laterality: Right;   ORIF WRIST FRACTURE  12/19/2011   Procedure: OPEN REDUCTION INTERNAL FIXATION (ORIF) WRIST FRACTURE;  Surgeon: Vickki Hearing, MD;  Location: [facility];  Service: Orthopedics;  Laterality: Right;   prostate cancer     Diagnosed in the 90s   TONSILLECTOMY      Social History   Socioeconomic History   Marital status: Widowed    Spouse name: Not on file   Number of children: Not on file   Years of education: Not on file   Highest education level: Not on file  Occupational History   Occupation: retired    Associate Professor: RETIRED  Tobacco Use   Smoking status: Never   Smokeless tobacco: Never  Vaping Use   Vaping Use: Never used  Substance and Sexual Activity   Alcohol use: No   Drug use: No   Sexual activity: Not Currently  Other Topics Concern   Not on file  Social History Narrative   Is long term resident of [facility]    Social Determinants of Health   Financial Resource Strain: Not on file  Food Insecurity: Not on file  Transportation Needs: Not on file  Physical Activity: Not on file  Stress: Not on file  Social Connections: Not on file  Intimate Partner Violence: Not on file   Family History  Problem Relation Age of Onset   Cancer Other    Heart attack Mother    Cancer Brother    Heart attack Brother    Heart disease Sister        by pass      VITAL SIGNS BP 136/84   Pulse 62   Temp 98.1 F (36.7 C)   Resp 18   Ht 5\' 9"  (1.753 m)   Wt 154 lb 12.8 oz (70.2 kg)   SpO2 97%   BMI 22.86 kg/m   Outpatient Encounter Medications as of 11/14/2022  Medication Sig   acetaminophen (TYLENOL) 500 MG tablet Take 1,000 mg by mouth 3 (three) times daily. Max 3000mg  in 24 hour period   [employer] (VENELEX) OINT Apply topically. Apply to sacrum, coccyx  and bilateral buttocks qshift.   busPIRone (BUSPAR) 5 MG tablet Take 5 mg by mouth 3 (three) times daily.   dilTIAZem HCl ER 300 MG TB24 Take 1 capsule by mouth daily.   hydrALAZINE (APRESOLINE) 10 MG tablet Take 10 mg by mouth 3 (three) times daily. Hypertensive chronic kidney disease with stage 1 through stage 4 chronic kidney disease, or unspecified chronic kidney disease   isosorbide dinitrate (ISORDIL) 30 MG tablet Take 30 mg by mouth 2 (two) times daily.   lisinopril (ZESTRIL) 40 MG tablet Take 40 mg by mouth daily. Hypertensive chronic kidney disease with stage 1 through stage 4 chronic  kidney disease, or unspecified chronic kidney disease   LORazepam (ATIVAN) 2 MG/ML injection Inject 0.5 mLs (1 mg total) into the muscle every 15 (fifteen) minutes as needed. Give up to 3 doses for seizure activity lasting longer than 30 seconds.   metoprolol tartrate (LOPRESSOR) 25 MG tablet Take 1 tablet (25 mg total) by mouth 2 (two) times daily.   Nutritional Supplements (ENSURE ENLIVE PO) Take 1 Can by mouth 2 (two) times daily.   omeprazole (PRILOSEC) 20 MG capsule Take 1 capsule (20 mg total) by mouth daily before breakfast.   polyethylene glycol (MIRALAX / GLYCOLAX) 17 g packet Take 17 g by mouth 2 (two) times daily.   potassium chloride (MICRO-K) 10 MEQ CR capsule Take 40 mEq by mouth daily.   rOPINIRole (REQUIP) 0.25 MG tablet Take 0.25 mg by mouth at bedtime.   saline (AYR) GEL Place 1 Application into both nostrils every 6 (six) hours as needed.   traZODone (DESYREL) 50 MG tablet Take 25 mg by mouth at bedtime.   UNABLE TO FIND Diet: DYS 3/Thin Liquids   venlafaxine (EFFEXOR) 75 MG tablet Take 75 mg by mouth 2 (two) times daily with a meal.   No facility-administered encounter medications on file as of 11/14/2022.     SIGNIFICANT DIAGNOSTIC EXAMS  PREVIOUS   11-15-21: scrotal ultrasounds No evidence of intratesticular   mass or torsion bilateral  No evidence of varicoceles bilateral  Small  sized right hydrocele Bilateral epididymal head is enlarges may represent epididymitis   01-23-22: dexa: t score -1.331  NO NEW EXAMS    LABS REVIEWED PREVIOUS      10-18-21: wbc 6.1; hgb 13.3; hct 41.5; mcv 89.7 plt 190; glucose 91; bun 25; creat 0.95; k+ 3.4; na++ 142; ca 9.3; gfr >60; protein 6.0; albumin 3.5 01-31-22: glucose 100; bun 20; creat 0.94; k+ 3.5; na++ 143; ca 9.5 gfr >60 02-11-22: glucose 102; bun 19; creat 0.91; k+ 3.3; na++ 141; ca 9.3; gfr >60 02-18-22: glucose 114; bun 27; creat 1.10; k+ 3.8; na++ 141; ca 9.3; gfr >60 04-01-22; wbc 6.9; hgb 13.3; hct 41.0; mcv 86.9 plt 174; glucose 115; bun 37; creat 1.07; k+ 4.1; na++ 144; ca 9.7; gfr >60; protein 6.0 albumin 3.5; tsh 0.157 vitamin B12; 856;  04-02-22: free t4: 0.80; RPR negative 04-05-22: wbc 6.6; hgb 12.8; hct 39.1; mcv 86.3 plt 196; glucose 140; bun 28; creat 1.14; k+ 4.2; na++ 142; ca 9.9; gfr >60; protein 6.4; albumin 3.7 05-29-22: wb 6.7; hgb 13.8; hct 42.8; mcv 85.4 plt 205; glucose 142; bun 18; creat 1.09; k+ 3.5; na++ 141; ca 9.5; gfr >60    NO NEW LABS.   Review of Systems  Constitutional:  Negative for malaise/fatigue.  Respiratory:  Negative for cough and shortness of breath.   Cardiovascular:  Negative for chest pain, palpitations and leg swelling.  Gastrointestinal:  Negative for abdominal pain, constipation and heartburn.  Musculoskeletal:  Negative for back pain, joint pain and myalgias.  Skin: Negative.   Neurological:  Negative for dizziness.  Psychiatric/Behavioral:  The patient is not nervous/anxious.    Physical Exam Constitutional:      General: He is not in acute distress.    Appearance: He is underweight. He is not diaphoretic.  Neck:     Thyroid: No thyromegaly.  Cardiovascular:     Rate and Rhythm: Normal rate and regular rhythm.     Pulses: Normal pulses.     Heart sounds: Normal heart sounds.  Pulmonary:  Effort: Pulmonary effort is normal. No respiratory distress.     Breath  sounds: Normal breath sounds.  Abdominal:     General: Bowel sounds are normal. There is no distension.     Palpations: Abdomen is soft.     Tenderness: There is no abdominal tenderness.  Musculoskeletal:        General: Normal range of motion.     Cervical back: Neck supple.     Right lower leg: No edema.     Left lower leg: No edema.  Lymphadenopathy:     Cervical: No cervical adenopathy.  Skin:    General: Skin is warm and dry.  Neurological:     Mental Status: He is alert. Mental status is at baseline.  Psychiatric:        Mood and Affect: Mood normal.        ASSESSMENT/ PLAN:  TODAY  Weight loss unintentional Adult failure to thrive  Vascular dementia with behavioral disturbance Major depression, chronic recurrent  Will change effexor to 75 mg twice daily  Will wean off trazodone and reqiup Will increase buspar to 5 mg in the AM and 10 mg in the afternoon and night.  Will check cmp; vitamin D and B12   Synthia Innocent NP [facility]  call [phone]

## 2022-11-27 ENCOUNTER — Encounter: Payer: Self-pay | Admitting: Internal Medicine

## 2022-11-27 ENCOUNTER — Non-Acute Institutional Stay (SKILLED_NURSING_FACILITY): Payer: Medicare Other | Admitting: Internal Medicine

## 2022-11-27 DIAGNOSIS — R627 Adult failure to thrive: Secondary | ICD-10-CM | POA: Diagnosis not present

## 2022-11-27 DIAGNOSIS — F01518 Vascular dementia, unspecified severity, with other behavioral disturbance: Secondary | ICD-10-CM | POA: Diagnosis not present

## 2022-11-27 DIAGNOSIS — I10 Essential (primary) hypertension: Secondary | ICD-10-CM

## 2022-11-27 DIAGNOSIS — I700 Atherosclerosis of aorta: Secondary | ICD-10-CM | POA: Diagnosis not present

## 2022-11-27 NOTE — Assessment & Plan Note (Addendum)
He has severe dementia of, confabulating about multiple symptoms.  He has difficulty following commands.  He could not even give me the number of his room.  No new behavioral issues reported by staff. For most part he remains in his wheelchair @ Nurses' station for close observation as fall prevention if he attempts to ambulate.

## 2022-11-27 NOTE — Assessment & Plan Note (Signed)
He describes atypical chest pain but this is 1 of many symptoms.  Clinically appears to be confabulating.

## 2022-11-27 NOTE — Progress Notes (Signed)
NURSING HOME LOCATION:  [facility] ROOM NUMBER:  [id number]  CODE STATUS:  DNR  PCP:  Synthia Innocent NP  This is a nursing facility follow up visit of chronic medical diagnoses & to document compliance with Regulation 483.30 (c) in [employer] Survey Manual Phase 2 which mandates caregiver visit ( visits can alternate among physician, PA or NP as per statutes) within 10 days of 30 days / 60 days/ 90 days post admission to SNF date    Interim medical record and care since last SNF visit was updated with review of diagnostic studies and change in clinical status since last visit were documented.  HPI: He is a permanent resident of this facility with history of biliary dyskinesia, chronic chest wall pain, essential hypertension, adult failure to thrive, GAD,history of hypothyroidism, dyslipidemia, interstitial lung disease, iron deficiency, hypothyroidism, RLS, vascular dementia, history of B12 deficiency and history of prostate cancer.  Labs were last performed in January of this year and revealed absence of anemia and therapeutic thyroid function test.  Review of systems: Dementia invalidated responses.  He could not give me his room number.  She confabulated about multiple issues.  He stated that he is having a headache in the left retroauricular area which is present most of the time as a burning sensation for the past 2 years.  He said "nobody can figure it out."  He also describes left sternal area chest pain described as sharp which "chokes me."  He then went on to run his hand down the left shin stating that that hurt.  He jumped as if in pain when I lightly touch the left patella.  Constitutional: No fever, significant weight change, fatigue  Eyes: No redness, discharge, pain, vision change ENT/mouth: No nasal congestion,  purulent discharge, earache, change in hearing, sore throat  Cardiovascular: No chest pain, palpitations, paroxysmal nocturnal dyspnea,  claudication, edema  Respiratory: No cough, sputum production, hemoptysis, DOE, significant snoring, apnea   Gastrointestinal: No heartburn, dysphagia, abdominal pain, nausea /vomiting, rectal bleeding, melena, change in bowels Genitourinary: No dysuria, hematuria, pyuria, incontinence, nocturia Musculoskeletal: No joint stiffness, joint swelling, weakness, pain Dermatologic: No rash, pruritus, change in appearance of skin Neurologic: No dizziness, headache, syncope, seizures, numbness, tingling Psychiatric: No significant anxiety, depression, insomnia, anorexia Endocrine: No change in hair/skin/nails, excessive thirst, excessive hunger, excessive urination  Hematologic/lymphatic: No significant bruising, lymphadenopathy, abnormal bleeding Allergy/immunology: No itchy/watery eyes, significant sneezing, urticaria, angioedema  Physical exam:  Pertinent or positive findings: When located he was in his wheelchair at the nurses station for close monitoring.  When I ask if I could take him back to his room; he could not give me the room number.  Pattern alopecia is present.  He exhibits a puzzled type countenance.  He is edentulous except for a few anterior mandibular teeth.  He has a grade 1/2 systolic murmur at the base.  Second heart sounds increased.  Breath sounds are decreased.  Pedal pulses are decreased to palpation.  He has 1+ edema at the ankles.  Limb atrophy is present especially over the lower extremities.  He has interosseous wasting.  He is wearing an antiwandering bracelet at the left ankle.  He had difficulty following any commands. He has a papular lesion in the left retroauricular area without signs of associated cellulitis.  General appearance: Adequately nourished; no acute distress, increased work of breathing is present.   Lymphatic: No lymphadenopathy about the head, neck, axilla. Eyes: No conjunctival  inflammation or lid edema is present. There is no scleral icterus. Ears:   External ear exam shows no significant lesions or deformities.   Nose:  External nasal examination shows no deformity or inflammation. Nasal mucosa are pink and moist without lesions, exudates Oral exam:  Lips and gums are healthy appearing. There is no oropharyngeal erythema or exudate. Neck:  No thyromegaly, masses, tenderness noted.    Heart:  Normal rate and regular rhythm. S1 and S2 normal without gallop, murmur, click, rub .  Lungs: Chest clear to auscultation without wheezes, rhonchi, rales, rubs. Abdomen: Bowel sounds are normal. Abdomen is soft and nontender with no organomegaly, hernias, masses. GU: Deferred  Extremities:  No cyanosis, clubbing, edema  Neurologic exam : Cn 2-7 intact Strength equal  in upper & lower extremities Balance, Rhomberg, finger to nose testing could not be completed due to clinical state Deep tendon reflexes are equal Skin: Warm & dry w/o tenting. No significant lesions or rash.  See summary under each active problem in the Problem List with associated updated therapeutic plan

## 2022-11-27 NOTE — Patient Instructions (Signed)
See assessment and plan under each diagnosis in the problem list and acutely for this visit 

## 2022-11-27 NOTE — Assessment & Plan Note (Signed)
Today his systolic blood pressure is mildly elevated.  It is an outlier as serially systolic has been less than 140 and also as low as 115.  Continue to monitor.  No change indicated unless the systolic blood pressure is consistently above 140.

## 2022-11-28 NOTE — Assessment & Plan Note (Addendum)
His insidious mental & physical decline continues w/o dramatic signs or symptoms. For the most part his status is almost vegetative, essentially sitting in the wheelchair  w/o interaction.

## 2022-12-03 ENCOUNTER — Encounter: Payer: Self-pay | Admitting: Internal Medicine

## 2022-12-03 ENCOUNTER — Other Ambulatory Visit (HOSPITAL_COMMUNITY)
Admission: RE | Admit: 2022-12-03 | Discharge: 2022-12-03 | Disposition: A | Discharge status: Home / Self Care | Payer: Medicare Other | Source: Ambulatory Visit | Attending: *Deleted | Admitting: *Deleted

## 2022-12-03 ENCOUNTER — Non-Acute Institutional Stay (SKILLED_NURSING_FACILITY): Payer: Medicare Other | Admitting: Internal Medicine

## 2022-12-03 DIAGNOSIS — I10 Essential (primary) hypertension: Secondary | ICD-10-CM | POA: Diagnosis not present

## 2022-12-03 DIAGNOSIS — M25561 Pain in right knee: Secondary | ICD-10-CM | POA: Diagnosis not present

## 2022-12-03 LAB — URIC ACID: Uric Acid, Serum: 5.2 mg/dL (ref 3.7–8.6)

## 2022-12-03 NOTE — Patient Instructions (Signed)
See assessment and plan under each diagnosis in the problem list and acutely for this visit 

## 2022-12-03 NOTE — Assessment & Plan Note (Signed)
Hypertension has been resistant; he is presently on 4 antihypertensive medication.  Recent blood pressures range from a low of 128/80 up to a high of 173/78.  Hydralazine will be increased to 25 mg 3 times daily from 10 mg 3 times daily with ongoing monitor.

## 2022-12-03 NOTE — Assessment & Plan Note (Signed)
Portable imaging of the right knee.  Uric acid to rule out acute gout.  Voltaren gel twice daily topically.  If symptoms persist or progress; referral to Orthopedist for diagnostic tap of effusion.

## 2022-12-03 NOTE — Progress Notes (Signed)
NURSING HOME LOCATION:  [facility] ROOM NUMBER:  [id number]  CODE STATUS: DNR    PCP:  Synthia Innocent NP  This is a nursing facility follow up visit for specific acute issue of pain and swelling of the knee.  Interim medical record and care since last SNF visit was updated with review of diagnostic studies and change in clinical status since last visit were documented.  HPI: In the context of his dementia he has fallen repeatedly with over 30 interventions.  Staff now notes swelling in his knee and he complains of pain. He states that "I do not want to follow anymore."  He then went on to say "I want a prize, gold monkey."  Staff has ordered such on [employer] and states that he will receive it if he does not attempt to mobilize by himself resulting in a fall for 1 week.  Review of systems: 7 (pain to the right knee.  He denies trauma to the knee and one of his multiple falls.  Constitutional: No fever, significant weight change, fatigue  Eyes: No redness, discharge, pain, vision change ENT/mouth: No nasal congestion,  purulent discharge, earache, change in hearing, sore throat  Cardiovascular: No chest pain, palpitations, paroxysmal nocturnal dyspnea, claudication, edema  Respiratory: No cough, sputum production, hemoptysis, DOE, significant snoring, apnea   Gastrointestinal: No heartburn, dysphagia, abdominal pain, nausea /vomiting, rectal bleeding, melena, change in bowels Genitourinary: No dysuria, hematuria, pyuria, incontinence, nocturia Musculoskeletal: No joint stiffness, joint swelling, weakness, pain Dermatologic: No rash, pruritus, change in appearance of skin Neurologic: No dizziness, headache, syncope, seizures, numbness, tingling Psychiatric: No significant anxiety, depression, insomnia, anorexia Endocrine: No change in hair/skin/nails, excessive thirst, excessive hunger, excessive urination  Hematologic/lymphatic: No significant bruising, lymphadenopathy,  abnormal bleeding Allergy/immunology: No itchy/watery eyes, significant sneezing, urticaria, angioedema  Physical exam:  Pertinent or positive findings: Affect is inappropriate.  Deep labs as he was describing pain in the neck.  Heart sounds are distant.  No carotid bruits or aortic bruits are present.  Pedal pulses are decreased.  He has trace edema at the right sock line.  He is wearing antiwondering bracelet at the left ankle.  The right knee is fusiform enlarged with palpable effusion.  There is slight increase in temperature to palpation over the right knee compared to the left.  There is no rash or ecchymotic change to the right knee.  There is subjective pain with extension of the knee.  General appearance: Adequately nourished; no acute distress, increased work of breathing is present.   Lymphatic: No lymphadenopathy about the head, neck, axilla. Eyes: No conjunctival inflammation or lid edema is present. There is no scleral icterus. Ears:  External ear exam shows no significant lesions or deformities.   Nose:  External nasal examination shows no deformity or inflammation. Nasal mucosa are pink and moist without lesions, exudates Oral exam:  Lips and gums are healthy appearing. There is no oropharyngeal erythema or exudate. Neck:  No thyromegaly, masses, tenderness noted.    Heart:  Normal rate and regular rhythm. S1 and S2 normal without gallop, murmur, click, rub .  Lungs: Chest clear to auscultation without wheezes, rhonchi, rales, rubs. Abdomen: Bowel sounds are normal. Abdomen is soft and nontender with no organomegaly, hernias, masses. GU: Deferred  Extremities:  No cyanosis, clubbing, edema  Neurologic exam : Cn 2-7 intact Strength equal  in upper & lower extremities Balance, Rhomberg, finger to nose testing could not be completed due to clinical state Deep tendon  reflexes are equal Skin: Warm & dry w/o tenting. No significant lesions or rash.  See summary under each active  problem in the Problem List with associated updated therapeutic plan

## 2022-12-07 ENCOUNTER — Emergency Department (HOSPITAL_COMMUNITY): Payer: Medicare Other

## 2022-12-07 ENCOUNTER — Encounter (HOSPITAL_COMMUNITY): Payer: Self-pay | Admitting: Emergency Medicine

## 2022-12-07 ENCOUNTER — Other Ambulatory Visit: Payer: Self-pay

## 2022-12-07 ENCOUNTER — Emergency Department (HOSPITAL_COMMUNITY)
Admission: EM | Admit: 2022-12-07 | Discharge: 2022-12-08 | Disposition: A | Discharge status: Home / Self Care | Payer: Medicare Other | Attending: Emergency Medicine | Admitting: Emergency Medicine

## 2022-12-07 DIAGNOSIS — Z8546 Personal history of malignant neoplasm of prostate: Secondary | ICD-10-CM | POA: Insufficient documentation

## 2022-12-07 DIAGNOSIS — I10 Essential (primary) hypertension: Secondary | ICD-10-CM | POA: Insufficient documentation

## 2022-12-07 DIAGNOSIS — F03918 Unspecified dementia, unspecified severity, with other behavioral disturbance: Secondary | ICD-10-CM | POA: Diagnosis not present

## 2022-12-07 DIAGNOSIS — R4182 Altered mental status, unspecified: Secondary | ICD-10-CM | POA: Diagnosis present

## 2022-12-07 DIAGNOSIS — Z79899 Other long term (current) drug therapy: Secondary | ICD-10-CM | POA: Diagnosis not present

## 2022-12-07 LAB — COMPREHENSIVE METABOLIC PANEL
ALT: 29 U/L (ref 0–44)
AST: 24 U/L (ref 15–41)
Albumin: 3.4 g/dL — ABNORMAL LOW (ref 3.5–5.0)
Alkaline Phosphatase: 92 U/L (ref 38–126)
Anion gap: 9 (ref 5–15)
BUN: 24 mg/dL — ABNORMAL HIGH (ref 8–23)
CO2: 26 mmol/L (ref 22–32)
Calcium: 9.4 mg/dL (ref 8.9–10.3)
Chloride: 104 mmol/L (ref 98–111)
Creatinine, Ser: 0.88 mg/dL (ref 0.61–1.24)
GFR, Estimated: >60 mL/min (ref >60)
Glucose, Bld: 133 mg/dL — ABNORMAL HIGH (ref 70–99)
Potassium: 3.4 mmol/L — ABNORMAL LOW (ref 3.5–5.1)
Sodium: 139 mmol/L (ref 135–145)
Total Bilirubin: 0.6 mg/dL (ref 0.3–1.2)
Total Protein: 6.4 g/dL — ABNORMAL LOW (ref 6.5–8.1)

## 2022-12-07 LAB — URINALYSIS, ROUTINE W REFLEX MICROSCOPIC
Bacteria, UA: NONE SEEN
Bilirubin Urine: NEGATIVE
Glucose, UA: NEGATIVE mg/dL
Hgb urine dipstick: NEGATIVE
Ketones, ur: NEGATIVE mg/dL
Leukocytes,Ua: NEGATIVE
Nitrite: NEGATIVE
Protein, ur: 100 mg/dL — AB
Specific Gravity, Urine: 1.008 (ref 1.005–1.030)
pH: 7 (ref 5.0–8.0)

## 2022-12-07 LAB — CBC WITH DIFFERENTIAL/PLATELET
Abs Immature Granulocytes: 0.03 10*3/uL (ref 0.00–0.07)
Basophils Absolute: 0 10*3/uL (ref 0.0–0.1)
Basophils Relative: 0 %
Eosinophils Absolute: 0 10*3/uL (ref 0.0–0.5)
Eosinophils Relative: 0 %
HCT: 41.5 % (ref 39.0–52.0)
Hemoglobin: 13.8 g/dL (ref 13.0–17.0)
Immature Granulocytes: 0 %
Lymphocytes Relative: 15 %
Lymphs Abs: 1.1 10*3/uL (ref 0.7–4.0)
MCH: 29.1 pg (ref 26.0–34.0)
MCHC: 33.3 g/dL (ref 30.0–36.0)
MCV: 87.4 fL (ref 80.0–100.0)
Monocytes Absolute: 0.8 10*3/uL (ref 0.1–1.0)
Monocytes Relative: 11 %
Neutro Abs: 5.4 10*3/uL (ref 1.7–7.7)
Neutrophils Relative %: 74 %
Platelets: 210 10*3/uL (ref 150–400)
RBC: 4.75 MIL/uL (ref 4.22–5.81)
RDW: 14 % (ref 11.5–15.5)
WBC: 7.3 10*3/uL (ref 4.0–10.5)
nRBC: 0 % (ref 0.0–0.2)

## 2022-12-07 LAB — CBG MONITORING, ED: Glucose-Capillary: 145 mg/dL — ABNORMAL HIGH (ref 70–99)

## 2022-12-07 MED ORDER — ROPINIROLE HCL 0.25 MG PO TABS
0.2500 mg | ORAL_TABLET | Freq: Every day | ORAL | Status: DC
  Administered 2022-12-07: 0.25 mg via ORAL
  Filled 2022-12-07: qty 1

## 2022-12-07 MED ORDER — ENSURE ENLIVE PO LIQD
1.0000 | Freq: Two times a day (BID) | ORAL | Status: DC
  Filled 2022-12-07 (×4): qty 237

## 2022-12-07 MED ORDER — HYDRALAZINE HCL 10 MG PO TABS
10.0000 mg | ORAL_TABLET | Freq: Three times a day (TID) | ORAL | Status: DC
  Administered 2022-12-07: 10 mg via ORAL
  Filled 2022-12-07: qty 1

## 2022-12-07 MED ORDER — VENLAFAXINE HCL 75 MG PO TABS: 75.0000 mg | ORAL_TABLET | Freq: Two times a day (BID) | ORAL | Status: DC

## 2022-12-07 MED ORDER — ACETAMINOPHEN 500 MG PO TABS
1000.0000 mg | ORAL_TABLET | Freq: Three times a day (TID) | ORAL | Status: DC
  Administered 2022-12-07: 1000 mg via ORAL
  Filled 2022-12-07: qty 2

## 2022-12-07 MED ORDER — METOPROLOL TARTRATE 25 MG PO TABS
25.0000 mg | ORAL_TABLET | Freq: Two times a day (BID) | ORAL | Status: DC
  Administered 2022-12-07: 25 mg via ORAL
  Filled 2022-12-07: qty 1

## 2022-12-07 MED ORDER — PANTOPRAZOLE SODIUM 40 MG PO TBEC: 40.0000 mg | DELAYED_RELEASE_TABLET | Freq: Every day | ORAL | Status: DC

## 2022-12-07 MED ORDER — POTASSIUM CHLORIDE CRYS ER 20 MEQ PO TBCR: 10.0000 meq | EXTENDED_RELEASE_TABLET | Freq: Every day | ORAL | Status: DC

## 2022-12-07 MED ORDER — ZIPRASIDONE MESYLATE 20 MG IM SOLR
10.0000 mg | Freq: Once | INTRAMUSCULAR | Status: AC
  Administered 2022-12-07: 10 mg via INTRAMUSCULAR
  Filled 2022-12-07: qty 20

## 2022-12-07 MED ORDER — BUSPIRONE HCL 5 MG PO TABS
5.0000 mg | ORAL_TABLET | Freq: Three times a day (TID) | ORAL | Status: DC
  Administered 2022-12-07: 5 mg via ORAL
  Filled 2022-12-07: qty 1

## 2022-12-07 MED ORDER — STERILE WATER FOR INJECTION IJ SOLN
INTRAMUSCULAR | Status: AC
  Filled 2022-12-07: qty 10

## 2022-12-07 MED ORDER — DILTIAZEM HCL ER COATED BEADS 300 MG PO CP24: 300.0000 mg | ORAL_CAPSULE | Freq: Every day | ORAL | Status: DC

## 2022-12-07 MED ORDER — POLYETHYLENE GLYCOL 3350 17 G PO PACK
17.0000 g | PACK | Freq: Two times a day (BID) | ORAL | Status: DC
  Administered 2022-12-07: 17 g via ORAL
  Filled 2022-12-07: qty 1

## 2022-12-07 MED ORDER — LISINOPRIL 10 MG PO TABS: 40.0000 mg | ORAL_TABLET | Freq: Every day | ORAL | Status: DC

## 2022-12-07 MED ORDER — TRAZODONE HCL 50 MG PO TABS
25.0000 mg | ORAL_TABLET | Freq: Every day | ORAL | Status: DC
  Administered 2022-12-07: 25 mg via ORAL
  Filled 2022-12-07: qty 1

## 2022-12-07 MED ORDER — ISOSORBIDE DINITRATE 30 MG PO TABS
30.0000 mg | ORAL_TABLET | Freq: Two times a day (BID) | ORAL | Status: DC
  Filled 2022-12-07 (×4): qty 1

## 2022-12-07 NOTE — ED Notes (Signed)
Pt encouraged to pee into urinal. Pt positioned with penis in urinal, asked staff to "give me a few minutes." Will return to bedside shortly and assess for need for in/out cath.
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
## 2022-12-07 NOTE — BH Assessment (Signed)
Contacted [facility] to arrange tele-psychiatry consult. The coordinator said they would call TTS back with a estimated time and name of provider.   Pamalee Leyden, [facility], [facility] Triage Specialist
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
## 2022-12-08 MED ORDER — QUETIAPINE FUMARATE 25 MG PO TABS: 12.5000 mg | ORAL_TABLET | Freq: Two times a day (BID) | ORAL | 1 refills | Status: DC | PRN

## 2022-12-08 MED ORDER — ACETAMINOPHEN 500 MG PO TABS
1000.0000 mg | ORAL_TABLET | Freq: Once | ORAL | Status: AC
  Administered 2022-12-08: 1000 mg via ORAL
  Filled 2022-12-08: qty 2

## 2022-12-08 NOTE — Discharge Instructions (Addendum)
You were evaluated in the Emergency Department and after careful evaluation, we did not find any emergent condition requiring admission or further testing in the hospital.  Your exam/testing today is overall reassuring.  Recommend using the Seroquel as needed for agitation, follow-up with your regular doctors.  Please return to the Emergency Department if you experience any worsening of your condition.   Thank you for allowing [place] to be a part of your care.

## 2022-12-08 NOTE — BH Assessment (Signed)
Received message from [place], RN stating that Pt is now awake and conversant. Notified [facility] and coordinator said Norval Morton, NP will see Pt at 0630.   Pamalee Leyden, [facility], [facility] Triage Specialist

## 2022-12-08 NOTE — BH Assessment (Addendum)
Received call from Marcy Salvo with [facility] stating that Norval Morton, NP will be available to perform consult at 0045. Created secure message including Marcy Salvo, Norval Morton, NP, Dr Jacalyn Lefevre, and Carlton Adam, RN to coordinate consult.   Pamalee Leyden, [facility], [facility] Triage Specialist

## 2022-12-08 NOTE — ED Notes (Signed)
Attempted to call report to [facility].  No answer at this time.

## 2022-12-08 NOTE — ED Notes (Signed)
Pt unable to stay awake for TTS evaluation (Received Geodon and later home trazodone). Plan to reassess at 8am or sooner if nursing finds patient is awake and conversant.

## 2022-12-08 NOTE — ED Provider Notes (Signed)
  Provider Note MRN:  [id number]  Arrival date & time: 12/08/22    ED Course and Medical Decision Making  Assumed care from Dr. Particia Nearing at shift change.  Dementia with behavioral disturbance, evaluated by TTS, recommending Seroquel twice daily.  Patient had some hypertension that responded to his normal medications.  Otherwise was at his baseline per report.  Appropriate for discharge.  Procedures  Final Clinical Impressions(s) / ED Diagnoses     ICD-10-CM   1. Dementia with behavioral disturbance (HCC)  F03.918       ED Discharge Orders          Ordered    QUEtiapine (SEROQUEL) 25 MG tablet  2 times daily PRN        12/08/22 0731              Discharge Instructions      You were evaluated in the Emergency Department and after careful evaluation, we did not find any emergent condition requiring admission or further testing in the hospital.  Your exam/testing today is overall reassuring.  Recommend using the Seroquel as needed for agitation, follow-up with your regular doctors.  Please return to the Emergency Department if you experience any worsening of your condition.   Thank you for allowing [place] to be a part of your care.    Elmer Sow. Pilar Plate, MD [facility] Emergency Medicine [facility] [email or web address]    Sabas Sous, MD 12/08/22 [phone]

## 2022-12-08 NOTE — ED Notes (Signed)
Report called to [facility], they will come to pick up pt.

## 2022-12-08 NOTE — BH Assessment (Signed)
12/08/2022 @ 0107: Per Jeffrey Apa Candis Shine, NP ([facility]) posted in secure message: "Jeffrey Sherman is unable to stay awake possibly due to receiving IM geodon. Recommend we let him sleep and we can schedule him to be seen in the morning for possible discharge back to the LTC. Its not uncommon for a 89 year old geriatric pt with dementia to get agitated."

## 2022-12-08 NOTE — Consult Note (Signed)
[facility] Consult Note  Patient Name: Jeffrey Sherman MRN: [id number] DOB: 11/19/1929 DATE OF Consult: 12/08/2022  PRIMARY PSYCHIATRIC DIAGNOSES  1.  Vascular dementia with behavioral disturbance   RECOMMENDATIONS  Recommendations: Medication recommendations: Consider Seroquel 12.5mg  PO BID PRN for agitation. Please monitor Orthostatic hypotension, sedation, fasting glucose and lipids. Address patient's foot pain. Non-Medication/therapeutic recommendations: follow up with outpatient provider Communication: Treatment team members (and family members if applicable) who were involved in treatment/care discussions and planning, and with whom we spoke or engaged with via secure text/chat, include the following: [place] RN  Thank you for involving [place] in the care of this patient. If you have any additional questions or concerns, please call [phone] and ask for me or the provider on-call.  TELEPSYCHIATRY ATTESTATION & CONSENT  As the provider for this telehealth consult, I attest that I verified the patient's identity using two separate identifiers, introduced myself to the patient, provided my credentials, disclosed my location, and performed this encounter via a HIPAA-compliant, real-time, face-to-face, two-way, interactive audio and video platform and with the full consent and agreement of the patient (or guardian as applicable.)  Patient physical location: [facility]. Telehealth provider physical location: home office in state of [place].  Video start time: 0509 (Central Time) Video end time: 0514 (Central Time)  IDENTIFYING DATA  Jeffrey Sherman is a 89 y.o. year-old male for whom a psychiatric consultation has been ordered by the primary provider. The patient was identified using two separate identifiers.  CHIEF COMPLAINT/REASON FOR CONSULT  agitation  HISTORY OF PRESENT ILLNESS (HPI)  Patient is a 89 year old male with a history of Vascular dementia, MDD and multiple  medical issues. He was brought to the ER for evaluation from the nursing care facility where he resides due to being agitated and combative.  He reports that his foot is hurting though unable to state what foot or provide further information about the cause or nature of the pain. The patient's assigned nurse reports that patient has a history of making offhanded comments about wanting to shoot people, particularly when he is upset or does not like what is happening around him. These comments have been made while in the ER though patient denies having a gun when asked. The patient's cognitive function appears to be impaired, as he struggles to answer questions. He refers to himself as "stupid" but does not provide any further information about his current state.  PAST PSYCHIATRIC HISTORY   Otherwise as per HPI above.  PAST MEDICAL HISTORY  Past Medical History:  Diagnosis Date   Anxiety    Biliary dyskinesia    Cancer [facility])    prostate   Chronic chest wall pain    Closed displaced fracture of right femoral neck (HCC) 03/09/2020   9/30-10/10/2019 minimally displaced right femoral neck fracture sustained in a mechanical fall  Cannulated right hip pinning by Dr. Fuller [place] 10/1  Full dose aspirin prophylaxis through 11/5 with subsequent dosage of 81 mg daily.   Epididymitis 11/2021   Bilateral [facility]   Essential hypertension, benign 06/20/2016   Failure to thrive in adult 08/14/2022   GAD (generalized anxiety disorder)    GERD (gastroesophageal reflux disease)    H/O echocardiogram 2016   normal   Hallucination 08/31/2021   Heart murmur    "leaking heart valve"   High cholesterol 06/20/2016   HOH (hard of hearing)    left   Hx of falling    Hyperthyroiditis 04/04/2022   Inguinal hernia  01/15/2022   12/17/2021 Urology PA documented small right inguinal hernia which was easily reducible.  General surgery assessment scheduled for 01/29/2022.  There is a discrepancy between the  severity of his complaints related to this and the physical findings.  He would be a high risk surgical candidate because of advanced age and other comorbidities.   Interstitial lung disease (HCC) 03/15/2020   03/12/2020 chronic interstitial changes stable compared to 2019 suggesting chronic interstitial lung disease.    Patient and family deny knowledge of this diagnosis; he has never been on maintenance oxygen.  Patient has never smoked.  He worked for over 2 decades at a [employer] and on one occasion was exposed to hydrochloric acid fumes.   Interstitial pulmonary disease (HCC)    Iron deficiency anemia due to dietary causes 03/22/2020   Low serum thyroid stimulating hormone (TSH) 08/17/2021   Lower GI bleed    MDD (major depressive disorder)    Murmur, cardiac    Poor historian    Prostate cancer (HCC)    PUD (peptic ulcer disease)    Right inguinal pain 01/29/2022   RLS (restless legs syndrome)    SARS-CoV-2 positive 09/13/2021   Shortness of breath    Vascular dementia without behavioral disturbance (HCC) 07/05/2022   Vitamin B 12 deficiency 09/03/2021   Wrist fracture 12/16/2011     HOME MEDICATIONS  Facility Ordered Medications  Medication   [COMPLETED] ziprasidone (GEODON) injection 10 mg   [COMPLETED] sterile water (preservative free) injection   acetaminophen (TYLENOL) tablet 1,000 mg   busPIRone (BUSPAR) tablet 5 mg   diltiazem (CARDIZEM CD) 24 hr capsule 300 mg   hydrALAZINE (APRESOLINE) tablet 10 mg   isosorbide dinitrate (ISORDIL) tablet 30 mg   lisinopril (ZESTRIL) tablet 40 mg   metoprolol tartrate (LOPRESSOR) tablet 25 mg   feeding supplement (ENSURE ENLIVE / ENSURE PLUS) liquid 237 mL   pantoprazole (PROTONIX) EC tablet 40 mg   polyethylene glycol (MIRALAX / GLYCOLAX) packet 17 g   potassium chloride SA (KLOR-CON M) CR tablet 10 mEq   rOPINIRole (REQUIP) tablet 0.25 mg   traZODone (DESYREL) tablet 25 mg   venlafaxine (EFFEXOR) tablet 75 mg    acetaminophen (TYLENOL) tablet 1,000 mg   PTA Medications  Medication Sig   omeprazole (PRILOSEC) 20 MG capsule Take 1 capsule (20 mg total) by mouth daily before breakfast.   acetaminophen (TYLENOL) 500 MG tablet Take 1,000 mg by mouth 3 (three) times daily. Max 3000mg  in 24 hour period   rOPINIRole (REQUIP) 0.25 MG tablet Take 0.25 mg by mouth at bedtime.   metoprolol tartrate (LOPRESSOR) 25 MG tablet Take 1 tablet (25 mg total) by mouth 2 (two) times daily.   UNABLE TO FIND Diet: DYS 3/Thin Liquids   lisinopril (ZESTRIL) 40 MG tablet Take 40 mg by mouth daily. Hypertensive chronic kidney disease with stage 1 through stage 4 chronic kidney disease, or unspecified chronic kidney disease   hydrALAZINE (APRESOLINE) 10 MG tablet Take 10 mg by mouth 3 (three) times daily. Hypertensive chronic kidney disease with stage 1 through stage 4 chronic kidney disease, or unspecified chronic kidney disease   polyethylene glycol (MIRALAX / GLYCOLAX) 17 g packet Take 17 g by mouth 2 (two) times daily.   dilTIAZem HCl ER 300 MG TB24 Take 1 capsule by mouth daily.   saline (AYR) GEL Place 1 Application into both nostrils every 6 (six) hours as needed.   isosorbide dinitrate (ISORDIL) 30 MG tablet Take 30 mg  by mouth 2 (two) times daily.   venlafaxine (EFFEXOR) 75 MG tablet Take 75 mg by mouth 2 (two) times daily with a meal.   potassium chloride (MICRO-K) 10 MEQ CR capsule Take 40 mEq by mouth daily.   Nutritional Supplements (ENSURE ENLIVE PO) Take 1 Can by mouth 2 (two) times daily.   traZODone (DESYREL) 50 MG tablet Take 25 mg by mouth at bedtime.   LORazepam (ATIVAN) 2 MG/ML injection Inject 0.5 mLs (1 mg total) into the muscle every 15 (fifteen) minutes as needed. Give up to 3 doses for seizure activity lasting longer than 30 seconds.   busPIRone (BUSPAR) 5 MG tablet Take 5 mg by mouth 3 (three) times daily.   Balsam [place]-Castor Oil (VENELEX) OINT Apply topically. Apply to sacrum, coccyx and bilateral  buttocks qshift.     ALLERGIES  Allergies  Allergen Reactions   Hctz [Hydrochlorothiazide]     hypokalemia   Xanax [Alprazolam] Other (See Comments)    Causes hallucinations     SOCIAL & SUBSTANCE USE HISTORY  Social History   Socioeconomic History   Marital status: Widowed    Spouse name: Not on file   Number of children: Not on file   Years of education: Not on file   Highest education level: Not on file  Occupational History   Occupation: retired    Associate Professor: RETIRED  Tobacco Use   Smoking status: Never   Smokeless tobacco: Never  Vaping Use   Vaping Use: Never used  Substance and Sexual Activity   Alcohol use: No   Drug use: No   Sexual activity: Not Currently  Other Topics Concern   Not on file  Social History Narrative   Is [facility] term resident of [facility]    Social Determinants of Health   Financial Resource Strain: Not on file  Food Insecurity: Not on file  Transportation Needs: Not on file  Physical Activity: Not on file  Stress: Not on file  Social Connections: Not on file   Social History   Tobacco Use  Smoking Status Never  Smokeless Tobacco Never   Social History   Substance and Sexual Activity  Alcohol Use No   Social History   Substance and Sexual Activity  Drug Use No    Additional pertinent information .  FAMILY HISTORY  Family History  Problem Relation Age of Onset   Cancer Other    Heart attack Mother    Cancer Brother    Heart attack Brother    Heart disease Sister        by pass   Family Psychiatric History (if known):  unknown  MENTAL STATUS EXAM (MSE)  Presentation  General Appearance:  Appropriate for Environment  Eye Contact:Good Speech:Garbled Speech Volume:Normal Handedness:-- (observed holding a cup with his left hand)  Mood and Affect  Mood:No data recorded Affect:Appropriate  Thought Process  Thought Processes:Disorganized Descriptions of Associations:Tangential  Orientation:-- (oriented only to  self)  Thought Content:Scattered; Tangential  History of Schizophrenia/Schizoaffective disorder:No data recorded Duration of Psychotic Symptoms:No data recorded Hallucinations:Hallucinations: -- (unable to assess)  Ideas of Reference:-- (unable to assess)  Suicidal Thoughts:Suicidal Thoughts: -- (unable to assess)  Homicidal Thoughts:Homicidal Thoughts: -- (unable to assess)   Sensorium  Memory:-- (unable to assess) Judgment:Impaired Insight:Poor  Executive Functions  Concentration:Poor Attention Span:Poor Recall:Poor Fund of Knowledge:Poor Language:Poor  Psychomotor Activity  Psychomotor Activity:No data recorded  Assets  Assets:No data recorded  Sleep  Sleep:No data recorded  VITALS  Blood pressure (!) 208/82, pulse Marland Kitchen)  55, temperature 97.7 F (36.5 C), temperature source Oral, resp. rate 20, height 5\' 9"  (1.753 m), weight 70 kg, SpO2 99 %.  LABS  Admission on 12/07/2022  Component Date Value Ref Range Status   Glucose-Capillary 12/07/2022 145 (H)  70 - 99 mg/dL Final   Glucose reference range applies only to samples taken after fasting for at least 8 hours.   WBC 12/07/2022 7.3  4.0 - 10.5 K/uL Final   RBC 12/07/2022 4.75  4.22 - 5.81 MIL/uL Final   Hemoglobin 12/07/2022 13.8  13.0 - 17.0 g/dL Final   HCT 16/03/9603 41.5  39.0 - 52.0 % Final   MCV 12/07/2022 87.4  80.0 - 100.0 fL Final   MCH 12/07/2022 29.1  26.0 - 34.0 pg Final   MCHC 12/07/2022 33.3  30.0 - 36.0 g/dL Final   RDW 54/02/8118 14.0  11.5 - 15.5 % Final   Platelets 12/07/2022 210  150 - 400 K/uL Final   nRBC 12/07/2022 0.0  0.0 - 0.2 % Final   Neutrophils Relative % 12/07/2022 74  % Final   Neutro Abs 12/07/2022 5.4  1.7 - 7.7 K/uL Final   Lymphocytes Relative 12/07/2022 15  % Final   Lymphs Abs 12/07/2022 1.1  0.7 - 4.0 K/uL Final   Monocytes Relative 12/07/2022 11  % Final   Monocytes Absolute 12/07/2022 0.8  0.1 - 1.0 K/uL Final   Eosinophils Relative 12/07/2022 0  % Final   Eosinophils  Absolute 12/07/2022 0.0  0.0 - 0.5 K/uL Final   Basophils Relative 12/07/2022 0  % Final   Basophils Absolute 12/07/2022 0.0  0.0 - 0.1 K/uL Final   Immature Granulocytes 12/07/2022 0  % Final   Abs Immature Granulocytes 12/07/2022 0.03  0.00 - 0.07 K/uL Final   Performed at [facility], [address] [place], [place] [address]   Sodium 12/07/2022 139  135 - 145 mmol/L Final   Potassium 12/07/2022 3.4 (L)  3.5 - 5.1 mmol/L Final   Chloride 12/07/2022 104  98 - 111 mmol/L Final   CO2 12/07/2022 26  22 - 32 mmol/L Final   Glucose, Bld 12/07/2022 133 (H)  70 - 99 mg/dL Final   Glucose reference range applies only to samples taken after fasting for at least 8 hours.   BUN 12/07/2022 24 (H)  8 - 23 mg/dL Final   Creatinine, Ser 12/07/2022 0.88  0.61 - 1.24 mg/dL Final   Calcium 95/62/1308 9.4  8.9 - 10.3 mg/dL Final   Total Protein 65/78/4696 6.4 (L)  6.5 - 8.1 g/dL Final   Albumin 29/52/8413 3.4 (L)  3.5 - 5.0 g/dL Final   AST 24/40/1027 24  15 - 41 U/L Final   ALT 12/07/2022 29  0 - 44 U/L Final   Alkaline Phosphatase 12/07/2022 92  38 - 126 U/L Final   Total Bilirubin 12/07/2022 0.6  0.3 - 1.2 mg/dL Final   GFR, Estimated 12/07/2022 >60  >60 mL/min Final   Comment: (NOTE) Calculated using the CKD-EPI Creatinine Equation (2021)    Anion gap 12/07/2022 9  5 - 15 Final   Performed at [facility], [address] [place], [place] [address]   Color, Urine 12/07/2022 STRAW (A)  YELLOW Final   APPearance 12/07/2022 CLEAR  CLEAR Final   Specific Gravity, Urine 12/07/2022 1.008  1.005 - 1.030 Final   pH 12/07/2022 7.0  5.0 - 8.0 Final   Glucose, UA 12/07/2022 NEGATIVE  NEGATIVE mg/dL Final   Hgb urine dipstick 12/07/2022  NEGATIVE  NEGATIVE Final   Bilirubin Urine 12/07/2022 NEGATIVE  NEGATIVE Final   Ketones, ur 12/07/2022 NEGATIVE  NEGATIVE mg/dL Final   Protein, ur 82/95/6213 100 (A)  NEGATIVE mg/dL Final   Nitrite 08/65/7846 NEGATIVE  NEGATIVE Final   Leukocytes,Ua 12/07/2022 NEGATIVE   NEGATIVE Final   RBC / HPF 12/07/2022 0-5  0 - 5 RBC/hpf Final   WBC, UA 12/07/2022 0-5  0 - 5 WBC/hpf Final   Bacteria, UA 12/07/2022 NONE SEEN  NONE SEEN Final   Squamous Epithelial / HPF 12/07/2022 0-5  0 - 5 /HPF Final   Performed at [facility], [address], [place], [place] [address]    PSYCHIATRIC REVIEW OF SYSTEMS (ROS)  - Patient exhibited confusion and disorientation - Expressed pain in the foot   Additional findings:      Musculoskeletal: unable to assess      Gait & Station: unable to assess      Pain Screening: reports having foot pain      Nutrition & Dental Concerns: unable to assess  RISK FORMULATION/ASSESSMENT  Is the patient experiencing any suicidal or homicidal ideations: unable to assess       Explain if yes:  Protective factors considered for safety management: access to appropriate clinical intervention  Risk factors/concerns considered for safety management:  Physical illness/chronic pain Age over 53 Aggression Male gender  Is there a safety management plan with the patient and treatment team to minimize risk factors and promote protective factors: Yes           Explain: medication management, outpatient follow up Is crisis care placement or psychiatric hospitalization recommended: No     Based on my current evaluation and risk assessment, patient is determined at this time to be at:  Moderate Risk  *RISK ASSESSMENT Risk assessment is a dynamic process; it is possible that this patient's condition, and risk level, may change. This should be re-evaluated and managed over time as appropriate. Please re-consult psychiatric consult services if additional assistance is needed in terms of risk assessment and management. If your team decides to discharge this patient, please advise the patient how to best access emergency psychiatric services, or to call 911, if their condition worsens or they feel unsafe in any way.   Norval Morton, NP Telepsychiatry  Consult Services

## 2022-12-08 NOTE — ED Notes (Signed)
Spoke to EDP about BP trending back up. No new orders. Plan to give 10am meds as ordered.

## 2022-12-08 NOTE — ED Notes (Signed)
Attempted to call report to [facility], no answer, HIPAA compliant message left at this time.

## 2022-12-09 ENCOUNTER — Encounter: Payer: Self-pay | Admitting: Adult Health

## 2022-12-09 ENCOUNTER — Non-Acute Institutional Stay (SKILLED_NURSING_FACILITY): Payer: Medicare Other | Admitting: Adult Health

## 2022-12-09 DIAGNOSIS — F01C11 Vascular dementia, severe, with agitation: Secondary | ICD-10-CM

## 2022-12-09 DIAGNOSIS — I129 Hypertensive chronic kidney disease with stage 1 through stage 4 chronic kidney disease, or unspecified chronic kidney disease: Secondary | ICD-10-CM | POA: Diagnosis not present

## 2022-12-09 DIAGNOSIS — N182 Chronic kidney disease, stage 2 (mild): Secondary | ICD-10-CM
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
## 2022-12-10 DIAGNOSIS — F01518 Vascular dementia, unspecified severity, with other behavioral disturbance: Secondary | ICD-10-CM | POA: Insufficient documentation

## 2022-12-10 DIAGNOSIS — F01C11 Vascular dementia, severe, with agitation: Secondary | ICD-10-CM | POA: Insufficient documentation

## 2022-12-23 ENCOUNTER — Encounter: Payer: Self-pay | Admitting: Student

## 2022-12-23 ENCOUNTER — Non-Acute Institutional Stay: Payer: Self-pay | Admitting: Student

## 2022-12-23 DIAGNOSIS — F339 Major depressive disorder, recurrent, unspecified: Secondary | ICD-10-CM

## 2022-12-23 DIAGNOSIS — G2581 Restless legs syndrome: Secondary | ICD-10-CM

## 2022-12-23 DIAGNOSIS — K219 Gastro-esophageal reflux disease without esophagitis: Secondary | ICD-10-CM

## 2022-12-23 DIAGNOSIS — I10 Essential (primary) hypertension: Secondary | ICD-10-CM

## 2022-12-23 DIAGNOSIS — E441 Mild protein-calorie malnutrition: Secondary | ICD-10-CM

## 2022-12-23 DIAGNOSIS — I700 Atherosclerosis of aorta: Secondary | ICD-10-CM

## 2022-12-23 DIAGNOSIS — F01518 Vascular dementia, unspecified severity, with other behavioral disturbance: Secondary | ICD-10-CM
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
## 2023-01-02 ENCOUNTER — Non-Acute Institutional Stay (SKILLED_NURSING_FACILITY): Payer: Medicare Other | Admitting: Adult Health

## 2023-01-02 ENCOUNTER — Encounter: Payer: Self-pay | Admitting: Adult Health

## 2023-01-02 DIAGNOSIS — F01C11 Vascular dementia, severe, with agitation: Secondary | ICD-10-CM

## 2023-01-02 DIAGNOSIS — F339 Major depressive disorder, recurrent, unspecified: Secondary | ICD-10-CM

## 2023-01-02 DIAGNOSIS — I700 Atherosclerosis of aorta: Secondary | ICD-10-CM

## 2023-01-02 NOTE — Progress Notes (Signed)
Location:  [facility] Room Number: 156 Place of Service:  SNF (31)   CODE STATUS: dnr   Allergies  Allergen Reactions   Hctz [Hydrochlorothiazide]     hypokalemia   Xanax [Alprazolam] Other (See Comments)    Causes hallucinations     Chief Complaint  Patient presents with   Acute Visit    Care plan meeting.     HPI:  We have come together for his care plan meeting. Family present  BIMS 6/15 mood 9/30: decreased energy, depression, nervous. He is nonambulatory has had 25 falls with no major injuries. He is dependent with his adl care. He is incontinent of bladder and frequently incontinent of bowel. Dietary: is on D3 diet with thin liquids weight is 151 pounds appetite 1-50%. Therapy: none at this time. Activities: socializing, TV, pet therapy. He continues to be followed for his chronic illnesses including: Aortic atherosclerosis     Severe vascular dementia with agitation    Major depression recurrent chronic  Past Medical History:  Diagnosis Date   Anxiety    Biliary dyskinesia    Cancer [facility])    prostate   Chronic chest wall pain    Closed displaced fracture of right femoral neck (HCC) 03/09/2020   9/30-10/10/2019 minimally displaced right femoral neck fracture sustained in a mechanical fall  Cannulated right hip pinning by Dr. Fuller Canada 10/1  Full dose aspirin prophylaxis through 11/5 with subsequent dosage of 81 mg daily.   Epididymitis 11/2021   Bilateral [facility]   Essential hypertension, benign 06/20/2016   Failure to thrive in adult 08/14/2022   GAD (generalized anxiety disorder)    GERD (gastroesophageal reflux disease)    H/O echocardiogram 2016   normal   Hallucination 08/31/2021   Heart murmur    "leaking heart valve"   High cholesterol 06/20/2016   HOH (hard of hearing)    left   Hx of falling    Hyperthyroiditis 04/04/2022   Inguinal hernia 01/15/2022   12/17/2021 Urology PA documented small right inguinal hernia  which was easily reducible.  General surgery assessment scheduled for 01/29/2022.  There is a discrepancy between the severity of his complaints related to this and the physical findings.  He would be a high risk surgical candidate because of advanced age and other comorbidities.   Interstitial lung disease (HCC) 03/15/2020   03/12/2020 chronic interstitial changes stable compared to 2019 suggesting chronic interstitial lung disease.    Patient and family deny knowledge of this diagnosis; he has never been on maintenance oxygen.  Patient has never smoked.  He worked for over 2 decades at a [employer] and on one occasion was exposed to hydrochloric acid fumes.   Interstitial pulmonary disease (HCC)    Iron deficiency anemia due to dietary causes 03/22/2020   Low serum thyroid stimulating hormone (TSH) 08/17/2021   Lower GI bleed    MDD (major depressive disorder)    Murmur, cardiac    Poor historian    Prostate cancer (HCC)    PUD (peptic ulcer disease)    Right inguinal pain 01/29/2022   RLS (restless legs syndrome)    SARS-CoV-2 positive 09/13/2021   Shortness of breath    Vascular dementia without behavioral disturbance (HCC) 07/05/2022   Vitamin B 12 deficiency 09/03/2021   Wrist fracture 12/16/2011    Past Surgical History:  Procedure Laterality Date   CHOLECYSTECTOMY N/A 06/28/2020   Procedure: LAPAROSCOPIC CHOLECYSTECTOMY;  Surgeon: Lucretia Roers,  MD;  Location: [facility]S;  Service: General;  Laterality: N/A;  pt in [facility]   COLONOSCOPY  2005   Rehman: Sigmoid colon diverticulosis, moderate sized external hemorrhoids. A few focal areas of mucosal erythema felt to be nonspecific.   ESOPHAGOGASTRODUODENOSCOPY     ESOPHAGOGASTRODUODENOSCOPY N/A 07/18/2016   Rehman: normal exam except duodenal scar. h.pylori serologies negative   FLEXIBLE SIGMOIDOSCOPY N/A 06/11/2020   Procedure: FLEXIBLE SIGMOIDOSCOPY;  Surgeon: Lanelle Bal, DO;  Location: [facility];   Service: Endoscopy;  Laterality: N/A;   HARDWARE REMOVAL Right 03/20/2021   Procedure: HARDWARE REMOVAL right hip;  Surgeon: Vickki Hearing, MD;  Location: [facility];  Service: Orthopedics;  Laterality: Right;   HIP PINNING,CANNULATED Right 03/10/2020   Procedure: INTERNAL FIXATION RIGHT HIP;  Surgeon: Vickki Hearing, MD;  Location: [facility];  Service: Orthopedics;  Laterality: Right;   ORIF WRIST FRACTURE  12/19/2011   Procedure: OPEN REDUCTION INTERNAL FIXATION (ORIF) WRIST FRACTURE;  Surgeon: Vickki Hearing, MD;  Location: [facility];  Service: Orthopedics;  Laterality: Right;   prostate cancer     Diagnosed in the 90s   TONSILLECTOMY      Social History   Socioeconomic History   Marital status: Widowed    Spouse name: Not on file   Number of children: Not on file   Years of education: Not on file   Highest education level: Not on file  Occupational History   Occupation: retired    Associate Professor: RETIRED  Tobacco Use   Smoking status: Never   Smokeless tobacco: Never  Vaping Use   Vaping status: Never Used  Substance and Sexual Activity   Alcohol use: No   Drug use: No   Sexual activity: Not Currently  Other Topics Concern   Not on file  Social History Narrative   Is long term resident of [facility]    Social Determinants of Health   Financial Resource Strain: Not on file  Food Insecurity: Not on file  Transportation Needs: Not on file  Physical Activity: Not on file  Stress: Not on file  Social Connections: Not on file  Intimate Partner Violence: Not on file   Family History  Problem Relation Age of Onset   Cancer Other    Heart attack Mother    Cancer Brother    Heart attack Brother    Heart disease Sister        by pass      VITAL SIGNS BP (!) 142/88   Pulse 66   Temp 98.4 F (36.9 C)   Resp 18   Ht 5\' 9"  (1.753 m)   Wt 151 lb (68.5 kg)   SpO2 96%   BMI 22.30 kg/m   Outpatient Encounter Medications as of 01/02/2023  Medication Sig   acetaminophen  (TYLENOL) 500 MG tablet Take 1,000 mg by mouth 3 (three) times daily. Max 3000mg  in 24 hour period   [employer] (VENELEX) OINT Apply topically. Apply to sacrum, coccyx and bilateral buttocks qshift.   busPIRone (BUSPAR) 10 MG tablet Take 10 mg by mouth 3 (three) times daily.   dilTIAZem HCl ER 300 MG TB24 Take 1 capsule by mouth daily.   hydrALAZINE (APRESOLINE) 10 MG tablet Take 10 mg by mouth 3 (three) times daily. Hypertensive chronic kidney disease with stage 1 through stage 4 chronic kidney disease, or unspecified chronic kidney disease   isosorbide dinitrate (ISORDIL) 30 MG tablet Take 30 mg by mouth 2 (two) times daily.  lisinopril (ZESTRIL) 40 MG tablet Take 40 mg by mouth daily. Hypertensive chronic kidney disease with stage 1 through stage 4 chronic kidney disease, or unspecified chronic kidney disease   metoprolol tartrate (LOPRESSOR) 25 MG tablet Take 1 tablet (25 mg total) by mouth 2 (two) times daily.   Nutritional Supplements (ENSURE ENLIVE PO) Take 1 Can by mouth 2 (two) times daily.   omeprazole (PRILOSEC) 20 MG capsule Take 1 capsule (20 mg total) by mouth daily before breakfast.   polyethylene glycol (MIRALAX / GLYCOLAX) 17 g packet Take 17 g by mouth 2 (two) times daily.   potassium chloride (MICRO-K) 10 MEQ CR capsule Take 40 mEq by mouth daily.   QUEtiapine (SEROQUEL) 25 MG tablet Take 0.5 tablets (12.5 mg total) by mouth 2 (two) times daily as needed.   rOPINIRole (REQUIP) 0.25 MG tablet Take 0.25 mg by mouth at bedtime.   saline (AYR) GEL Place 1 Application into both nostrils every 6 (six) hours as needed.   traZODone (DESYREL) 50 MG tablet Take 25 mg by mouth at bedtime.   UNABLE TO FIND Diet: DYS 3/Thin Liquids   venlafaxine (EFFEXOR) 75 MG tablet Take 75 mg by mouth 2 (two) times daily with a meal.   [DISCONTINUED] LORazepam (ATIVAN) 2 MG/ML injection Inject 0.5 mLs (1 mg total) into the muscle every 15 (fifteen) minutes as needed. Give up to 3 doses for  seizure activity lasting longer than 30 seconds.   [DISCONTINUED] busPIRone (BUSPAR) 5 MG tablet Take 5 mg by mouth 3 (three) times daily.   No facility-administered encounter medications on file as of 01/02/2023.     SIGNIFICANT DIAGNOSTIC EXAMS  01-23-22: dexa: t score -1.331  NO NEW EXAMS    LABS REVIEWED PREVIOUS      01-31-22: glucose 100; bun 20; creat 0.94; k+ 3.5; na++ 143; ca 9.5 gfr >60 02-11-22: glucose 102; bun 19; creat 0.91; k+ 3.3; na++ 141; ca 9.3; gfr >60 02-18-22: glucose 114; bun 27; creat 1.10; k+ 3.8; na++ 141; ca 9.3; gfr >60 04-01-22; wbc 6.9; hgb 13.3; hct 41.0; mcv 86.9 plt 174; glucose 115; bun 37; creat 1.07; k+ 4.1; na++ 144; ca 9.7; gfr >60; protein 6.0 albumin 3.5; tsh 0.157 vitamin B12; 856;  04-02-22: free t4: 0.80; RPR negative 04-05-22: wbc 6.6; hgb 12.8; hct 39.1; mcv 86.3 plt 196; glucose 140; bun 28; creat 1.14; k+ 4.2; na++ 142; ca 9.9; gfr >60; protein 6.4; albumin 3.7 05-29-22: wb 6.7; hgb 13.8; hct 42.8; mcv 85.4 plt 205; glucose 142; bun 18; creat 1.09; k+ 3.5; na++ 141; ca 9.5; gfr >60   06-24-22: hgb 13.0; hct 40.4; tsh 1.153 free t4: 0.95  12-03-22: uric acid: 5.2 12-07-22: wbc 7.3; hgb 13.8; hct 41.5; mcv 87.4 plt 210; glucose 133; bun 24; creat 0.88; k+ 34; na++ 139; ca 9.4 gfr >60 protein 6.4 albumin 3.4   NO NEW LABS.   Review of Systems  Unable to perform ROS: Dementia   Physical Exam Constitutional:      General: He is not in acute distress.    Appearance: He is well-developed. He is not diaphoretic.  Neck:     Thyroid: No thyromegaly.  Cardiovascular:     Rate and Rhythm: Normal rate and regular rhythm.     Pulses: Normal pulses.     Heart sounds: Normal heart sounds.  Pulmonary:     Effort: Pulmonary effort is normal. No respiratory distress.     Breath sounds: Normal breath sounds.  Abdominal:  General: Bowel sounds are normal. There is no distension.     Palpations: Abdomen is soft.     Tenderness: There is no abdominal  tenderness.  Musculoskeletal:        General: Normal range of motion.     Cervical back: Neck supple.     Right lower leg: No edema.     Left lower leg: No edema.  Lymphadenopathy:     Cervical: No cervical adenopathy.  Skin:    General: Skin is warm and dry.  Neurological:     Mental Status: He is alert. Mental status is at baseline.  Psychiatric:        Mood and Affect: Mood normal.      ASSESSMENT/ PLAN:  TODAY  Aortic atherosclerosis Severe vascular dementia with agitation Major depression recurrent chronic  Will continue current medications Will continue current plan of care Will continue to monitor his status.    Time spent with patient: 40 minutes: medications; plan of care; dietary.    Synthia Innocent NP [facility]   call [phone]

## 2023-01-08 ENCOUNTER — Encounter: Payer: Self-pay | Admitting: Adult Health

## 2023-01-08 ENCOUNTER — Non-Acute Institutional Stay (SKILLED_NURSING_FACILITY): Payer: Medicare Other | Admitting: Adult Health

## 2023-01-08 DIAGNOSIS — K219 Gastro-esophageal reflux disease without esophagitis: Secondary | ICD-10-CM

## 2023-01-08 DIAGNOSIS — N182 Chronic kidney disease, stage 2 (mild): Secondary | ICD-10-CM

## 2023-01-08 DIAGNOSIS — I129 Hypertensive chronic kidney disease with stage 1 through stage 4 chronic kidney disease, or unspecified chronic kidney disease: Secondary | ICD-10-CM

## 2023-01-08 DIAGNOSIS — I700 Atherosclerosis of aorta: Secondary | ICD-10-CM

## 2023-01-08 DIAGNOSIS — E441 Mild protein-calorie malnutrition: Secondary | ICD-10-CM | POA: Diagnosis not present

## 2023-01-08 NOTE — Progress Notes (Signed)
Location:  [facility] Room Number: 156 Place of Service:  SNF (31)   CODE STATUS: dnr   Allergies  Allergen Reactions   Hctz [Hydrochlorothiazide]     hypokalemia   Xanax [Alprazolam] Other (See Comments)    Causes hallucinations     Chief Complaint  Patient presents with   Medical Management of Chronic Issues         Aortic atherosclerosis     Protein calorie malnutrition:    Benign hypertension with chronic kidney disease stage III:     Gastroesophageal reflux disease without esophagitis     HPI:  He is a 89 year old long term resident of this facility being seen for the management of his chronic illnesses: Aortic atherosclerosis     Protein calorie malnutrition:    Benign hypertension with chronic kidney disease stage III:     Gastroesophageal reflux disease without esophagitis.  He continues to get out of bed daily. There are no reports of uncontrolled pain. He has had several falls without injuries.   Past Medical History:  Diagnosis Date   Anxiety    Biliary dyskinesia    Cancer [facility])    prostate   Chronic chest wall pain    Closed displaced fracture of right femoral neck (HCC) 03/09/2020   9/30-10/10/2019 minimally displaced right femoral neck fracture sustained in a mechanical fall  Cannulated right hip pinning by Dr. Fuller Canada 10/1  Full dose aspirin prophylaxis through 11/5 with subsequent dosage of 81 mg daily.   Epididymitis 11/2021   Bilateral [facility]   Essential hypertension, benign 06/20/2016   Failure to thrive in adult 08/14/2022   GAD (generalized anxiety disorder)    GERD (gastroesophageal reflux disease)    H/O echocardiogram 2016   normal   Hallucination 08/31/2021   Heart murmur    "leaking heart valve"   High cholesterol 06/20/2016   HOH (hard of hearing)    left   Hx of falling    Hyperthyroiditis 04/04/2022   Inguinal hernia 01/15/2022   12/17/2021 Urology PA documented small right inguinal hernia  which was easily reducible.  General surgery assessment scheduled for 01/29/2022.  There is a discrepancy between the severity of his complaints related to this and the physical findings.  He would be a high risk surgical candidate because of advanced age and other comorbidities.   Interstitial lung disease (HCC) 03/15/2020   03/12/2020 chronic interstitial changes stable compared to 2019 suggesting chronic interstitial lung disease.    Patient and family deny knowledge of this diagnosis; he has never been on maintenance oxygen.  Patient has never smoked.  He worked for over 2 decades at a [employer] and on one occasion was exposed to hydrochloric acid fumes.   Interstitial pulmonary disease (HCC)    Iron deficiency anemia due to dietary causes 03/22/2020   Low serum thyroid stimulating hormone (TSH) 08/17/2021   Lower GI bleed    MDD (major depressive disorder)    Murmur, cardiac    Poor historian    Prostate cancer (HCC)    PUD (peptic ulcer disease)    Right inguinal pain 01/29/2022   RLS (restless legs syndrome)    SARS-CoV-2 positive 09/13/2021   Shortness of breath    Vascular dementia without behavioral disturbance (HCC) 07/05/2022   Vitamin B 12 deficiency 09/03/2021   Wrist fracture 12/16/2011    Past Surgical History:  Procedure Laterality Date   CHOLECYSTECTOMY N/A 06/28/2020  Procedure: LAPAROSCOPIC CHOLECYSTECTOMY;  Surgeon: Lucretia Roers, MD;  Location: [facility];  Service: General;  Laterality: N/A;  pt in [facility]   COLONOSCOPY  2005   Rehman: Sigmoid colon diverticulosis, moderate sized external hemorrhoids. A few focal areas of mucosal erythema felt to be nonspecific.   ESOPHAGOGASTRODUODENOSCOPY     ESOPHAGOGASTRODUODENOSCOPY N/A 07/18/2016   Rehman: normal exam except duodenal scar. h.pylori serologies negative   FLEXIBLE SIGMOIDOSCOPY N/A 06/11/2020   Procedure: FLEXIBLE SIGMOIDOSCOPY;  Surgeon: Lanelle Bal, DO;  Location: [facility];   Service: Endoscopy;  Laterality: N/A;   HARDWARE REMOVAL Right 03/20/2021   Procedure: HARDWARE REMOVAL right hip;  Surgeon: Vickki Hearing, MD;  Location: [facility];  Service: Orthopedics;  Laterality: Right;   HIP PINNING,CANNULATED Right 03/10/2020   Procedure: INTERNAL FIXATION RIGHT HIP;  Surgeon: Vickki Hearing, MD;  Location: [facility];  Service: Orthopedics;  Laterality: Right;   ORIF WRIST FRACTURE  12/19/2011   Procedure: OPEN REDUCTION INTERNAL FIXATION (ORIF) WRIST FRACTURE;  Surgeon: Vickki Hearing, MD;  Location: [facility];  Service: Orthopedics;  Laterality: Right;   prostate cancer     Diagnosed in the 90s   TONSILLECTOMY      Social History   Socioeconomic History   Marital status: Widowed    Spouse name: Not on file   Number of children: Not on file   Years of education: Not on file   Highest education level: Not on file  Occupational History   Occupation: retired    Associate Professor: RETIRED  Tobacco Use   Smoking status: Never   Smokeless tobacco: Never  Vaping Use   Vaping status: Never Used  Substance and Sexual Activity   Alcohol use: No   Drug use: No   Sexual activity: Not Currently  Other Topics Concern   Not on file  Social History Narrative   Is long term resident of [facility]    Social Determinants of Health   Financial Resource Strain: Not on file  Food Insecurity: Not on file  Transportation Needs: Not on file  Physical Activity: Not on file  Stress: Not on file  Social Connections: Not on file  Intimate Partner Violence: Not on file   Family History  Problem Relation Age of Onset   Cancer Other    Heart attack Mother    Cancer Brother    Heart attack Brother    Heart disease Sister        by pass      VITAL SIGNS BP 114/63   Pulse 69   Temp (!) 97.3 F (36.3 C)   Resp (!) 69   Ht 5\' 9"  (1.753 m)   Wt 151 lb (68.5 kg)   SpO2 97%   BMI 22.30 kg/m   Outpatient Encounter Medications as of 01/08/2023  Medication Sig    acetaminophen (TYLENOL) 500 MG tablet Take 1,000 mg by mouth 3 (three) times daily. Max 3000mg  in 24 hour period   [employer] (VENELEX) OINT Apply topically. Apply to sacrum, coccyx and bilateral buttocks qshift.   busPIRone (BUSPAR) 10 MG tablet Take 10 mg by mouth 3 (three) times daily.   dilTIAZem HCl ER 300 MG TB24 Take 1 capsule by mouth daily.   hydrALAZINE (APRESOLINE) 10 MG tablet Take 10 mg by mouth 3 (three) times daily. Hypertensive chronic kidney disease with stage 1 through stage 4 chronic kidney disease, or unspecified chronic kidney disease   isosorbide dinitrate (ISORDIL) 30 MG tablet Take  30 mg by mouth 2 (two) times daily.   lisinopril (ZESTRIL) 40 MG tablet Take 40 mg by mouth daily. Hypertensive chronic kidney disease with stage 1 through stage 4 chronic kidney disease, or unspecified chronic kidney disease   metoprolol tartrate (LOPRESSOR) 25 MG tablet Take 1 tablet (25 mg total) by mouth 2 (two) times daily.   Nutritional Supplements (ENSURE ENLIVE PO) Take 1 Can by mouth 2 (two) times daily.   omeprazole (PRILOSEC) 20 MG capsule Take 1 capsule (20 mg total) by mouth daily before breakfast.   polyethylene glycol (MIRALAX / GLYCOLAX) 17 g packet Take 17 g by mouth 2 (two) times daily.   potassium chloride (MICRO-K) 10 MEQ CR capsule Take 40 mEq by mouth daily.   QUEtiapine (SEROQUEL) 25 MG tablet Take 0.5 tablets (12.5 mg total) by mouth 2 (two) times daily as needed.   rOPINIRole (REQUIP) 0.25 MG tablet Take 0.25 mg by mouth at bedtime.   saline (AYR) GEL Place 1 Application into both nostrils every 6 (six) hours as needed.   traZODone (DESYREL) 50 MG tablet Take 25 mg by mouth at bedtime.   UNABLE TO FIND Diet: DYS 3/Thin Liquids   venlafaxine (EFFEXOR) 75 MG tablet Take 75 mg by mouth 2 (two) times daily with a meal.   No facility-administered encounter medications on file as of 01/08/2023.     SIGNIFICANT DIAGNOSTIC EXAMS   PREVIOUS   11-15-21: scrotal  ultrasounds No evidence of intratesticular   mass or torsion bilateral  No evidence of varicoceles bilateral  Small sized right hydrocele Bilateral epididymal head is enlarges may represent epididymitis   01-23-22: dexa: t score -1.331  NO NEW EXAMS    LABS REVIEWED PREVIOUS      01-31-22: glucose 100; bun 20; creat 0.94; k+ 3.5; na++ 143; ca 9.5 gfr >60 02-11-22: glucose 102; bun 19; creat 0.91; k+ 3.3; na++ 141; ca 9.3; gfr >60 02-18-22: glucose 114; bun 27; creat 1.10; k+ 3.8; na++ 141; ca 9.3; gfr >60 04-01-22; wbc 6.9; hgb 13.3; hct 41.0; mcv 86.9 plt 174; glucose 115; bun 37; creat 1.07; k+ 4.1; na++ 144; ca 9.7; gfr >60; protein 6.0 albumin 3.5; tsh 0.157 vitamin B12; 856;  04-02-22: free t4: 0.80; RPR negative 04-05-22: wbc 6.6; hgb 12.8; hct 39.1; mcv 86.3 plt 196; glucose 140; bun 28; creat 1.14; k+ 4.2; na++ 142; ca 9.9; gfr >60; protein 6.4; albumin 3.7 05-29-22: wb 6.7; hgb 13.8; hct 42.8; mcv 85.4 plt 205; glucose 142; bun 18; creat 1.09; k+ 3.5; na++ 141; ca 9.5; gfr >60   06-24-22: hgb 13.0; hct 40.4; tsh 1.153 free t4: 0.95  12-03-22: uric acid: 5.2 12-07-22: wbc 7.3; hgb 13.8; hct 41.5; mcv 87.4 plt 210; glucose 133; bun 24; creat 0.88; k+ 34; na++ 139; ca 9.4 gfr >60 protein 6.4 albumin 3.4   NO NEW LABS.   Review of Systems  Unable to perform ROS: Dementia   Physical Exam Constitutional:      General: He is not in acute distress.    Appearance: He is well-developed. He is not diaphoretic.  Neck:     Thyroid: No thyromegaly.  Cardiovascular:     Rate and Rhythm: Normal rate and regular rhythm.     Pulses: Normal pulses.     Heart sounds: Normal heart sounds.  Pulmonary:     Effort: Pulmonary effort is normal. No respiratory distress.     Breath sounds: Normal breath sounds.  Abdominal:     General: Bowel sounds  are normal. There is no distension.     Palpations: Abdomen is soft.     Tenderness: There is no abdominal tenderness.  Musculoskeletal:         General: Normal range of motion.     Cervical back: Neck supple.     Right lower leg: No edema.     Left lower leg: No edema.  Lymphadenopathy:     Cervical: No cervical adenopathy.  Skin:    General: Skin is warm and dry.  Neurological:     Mental Status: He is alert. Mental status is at baseline.  Psychiatric:        Mood and Affect: Mood normal.      ASSESSMENT/ PLAN:  TODAY  Aortic atherosclerosis (ct 06-09-20) no statin due to advanced age  72. Protein calorie malnutrition: albumin 3.7 will monitor   3. Benign hypertension with chronic kidney disease stage III: b/p 114/63 will continue lopressor 25 mg twice daily cardizem er 300 mg daily lisinopril 40 mg daily hydralazine 10 mg three times daily isordil 30 mg twice daily   4. Gastroesophageal reflux disease without esophagitis: will continue prilosec 20 mg daily   PREVIOUS     5. Prostate cancer/over active bladder: will monitor  no oxybutynin due to history of hip fracture; off myebretiq due to hypertension   6, Hypokalemia k+ 3.5 will continue k+ 40 meq daily    7. Dyslipidemia: is off zocor due to advanced age  18. Chronic constipation: will continue miralax twice daily   9. CKD stage 3b: bun 18; creat 1.09; gfr>60   10. Major depression recurrent chronic: will continue effexor 75 mg twice daily will continue seroquel 12.5 mg twice daily   11. RLS will continue requip 0.25 mg nightly   12. Iron deficiency anemia due to dietary intake hgb 13.8 is off iron  13. Interstitial lung disease: will monitor   14. Vitamin B 12 deficiency: will monitor  15. Osteopenia: t score -1.331  16. Left hip pain: will continue tylenol 1 gm three times daily   17. Dementia without behavioral disturbance; unspecified dementia type/failure to thrive in adult: weight is 151 pounds.     Synthia Innocent NP [facility]  call [phone]

## 2023-01-21 ENCOUNTER — Non-Acute Institutional Stay: Payer: Self-pay | Admitting: Adult Health

## 2023-01-21 ENCOUNTER — Encounter: Payer: Self-pay | Admitting: Adult Health

## 2023-01-21 ENCOUNTER — Other Ambulatory Visit (HOSPITAL_COMMUNITY)
Admission: RE | Admit: 2023-01-21 | Discharge: 2023-01-21 | Disposition: A | Discharge status: Home / Self Care | Payer: Medicare Other | Source: Skilled Nursing Facility | Attending: Internal Medicine | Admitting: Internal Medicine

## 2023-01-21 DIAGNOSIS — R768 Other specified abnormal immunological findings in serum: Secondary | ICD-10-CM

## 2023-01-21 DIAGNOSIS — Z20822 Contact with and (suspected) exposure to covid-19: Secondary | ICD-10-CM | POA: Insufficient documentation

## 2023-01-21 DIAGNOSIS — F01C11 Vascular dementia, severe, with agitation: Secondary | ICD-10-CM | POA: Diagnosis not present

## 2023-01-21 DIAGNOSIS — N182 Chronic kidney disease, stage 2 (mild): Secondary | ICD-10-CM | POA: Insufficient documentation

## 2023-01-21 DIAGNOSIS — I129 Hypertensive chronic kidney disease with stage 1 through stage 4 chronic kidney disease, or unspecified chronic kidney disease: Secondary | ICD-10-CM | POA: Diagnosis not present

## 2023-01-21 LAB — CBC
HCT: 43.7 % (ref 39.0–52.0)
Hemoglobin: 13.8 g/dL (ref 13.0–17.0)
MCH: 28.2 pg (ref 26.0–34.0)
MCHC: 31.6 g/dL (ref 30.0–36.0)
MCV: 89.2 fL (ref 80.0–100.0)
Platelets: 266 10*3/uL (ref 150–400)
RBC: 4.9 MIL/uL (ref 4.22–5.81)
RDW: 14 % (ref 11.5–15.5)
WBC: 9.9 10*3/uL (ref 4.0–10.5)
nRBC: 0 % (ref 0.0–0.2)

## 2023-01-21 LAB — BASIC METABOLIC PANEL
Anion gap: 9 (ref 5–15)
BUN: 26 mg/dL — ABNORMAL HIGH (ref 8–23)
CO2: 29 mmol/L (ref 22–32)
Calcium: 9.5 mg/dL (ref 8.9–10.3)
Chloride: 102 mmol/L (ref 98–111)
Creatinine, Ser: 0.91 mg/dL (ref 0.61–1.24)
GFR, Estimated: >60 mL/min (ref >60)
Glucose, Bld: 125 mg/dL — ABNORMAL HIGH (ref 70–99)
Potassium: 3.2 mmol/L — ABNORMAL LOW (ref 3.5–5.1)
Sodium: 140 mmol/L (ref 135–145)

## 2023-01-21 LAB — D-DIMER, QUANTITATIVE: D-Dimer, Quant: 0.48 ug/mL-FEU (ref 0.00–0.50)

## 2023-01-21 NOTE — Progress Notes (Unsigned)
Location:  [facility] Room Number: 156 Place of Service:  SNF (31)   CODE STATUS: dnr   Allergies  Allergen Reactions   Hctz [Hydrochlorothiazide]     hypokalemia   Xanax [Alprazolam] Other (See Comments)    Causes hallucinations     Chief Complaint  Patient presents with   Acute Visit    COVID +    HPI:  He has tested positive for COVID. There are no reports of cough; no shortness of breath; no fevers present. Staff report that he is having increased episodes of agitation; from which he is not easily side tracked from his behaviors. His blood pressure readings remain elevated.   Past Medical History:  Diagnosis Date   Anxiety    Biliary dyskinesia    Cancer [facility])    prostate   Chronic chest wall pain    Closed displaced fracture of right femoral neck (HCC) 03/09/2020   9/30-10/10/2019 minimally displaced right femoral neck fracture sustained in a mechanical fall  Cannulated right hip pinning by Dr. Fuller Canada 10/1  Full dose aspirin prophylaxis through 11/5 with subsequent dosage of 81 mg daily.   Epididymitis 11/2021   Bilateral [facility]   Essential hypertension, benign 06/20/2016   Failure to thrive in adult 08/14/2022   GAD (generalized anxiety disorder)    GERD (gastroesophageal reflux disease)    H/O echocardiogram 2016   normal   Hallucination 08/31/2021   Heart murmur    "leaking heart valve"   High cholesterol 06/20/2016   HOH (hard of hearing)    left   Hx of falling    Hyperthyroiditis 04/04/2022   Inguinal hernia 01/15/2022   12/17/2021 Urology PA documented small right inguinal hernia which was easily reducible.  General surgery assessment scheduled for 01/29/2022.  There is a discrepancy between the severity of his complaints related to this and the physical findings.  He would be a high risk surgical candidate because of advanced age and other comorbidities.   Interstitial lung disease (HCC) 03/15/2020    03/12/2020 chronic interstitial changes stable compared to 2019 suggesting chronic interstitial lung disease.    Patient and family deny knowledge of this diagnosis; he has never been on maintenance oxygen.  Patient has never smoked.  He worked for over 2 decades at a [employer] and on one occasion was exposed to hydrochloric acid fumes.   Interstitial pulmonary disease (HCC)    Iron deficiency anemia due to dietary causes 03/22/2020   Low serum thyroid stimulating hormone (TSH) 08/17/2021   Lower GI bleed    MDD (major depressive disorder)    Murmur, cardiac    Poor historian    Prostate cancer (HCC)    PUD (peptic ulcer disease)    Right inguinal pain 01/29/2022   RLS (restless legs syndrome)    SARS-CoV-2 positive 09/13/2021   Shortness of breath    Vascular dementia without behavioral disturbance (HCC) 07/05/2022   Vitamin B 12 deficiency 09/03/2021   Wrist fracture 12/16/2011    Past Surgical History:  Procedure Laterality Date   CHOLECYSTECTOMY N/A 06/28/2020   Procedure: LAPAROSCOPIC CHOLECYSTECTOMY;  Surgeon: Lucretia Roers, MD;  Location: [facility] ORS;  Service: General;  Laterality: N/A;  pt in [facility]   COLONOSCOPY  2005   Rehman: Sigmoid colon diverticulosis, moderate sized external hemorrhoids. A few focal areas of mucosal erythema felt to be nonspecific.   ESOPHAGOGASTRODUODENOSCOPY     ESOPHAGOGASTRODUODENOSCOPY N/A 07/18/2016   Rehman:  normal exam except duodenal scar. h.pylori serologies negative   FLEXIBLE SIGMOIDOSCOPY N/A 06/11/2020   Procedure: FLEXIBLE SIGMOIDOSCOPY;  Surgeon: Lanelle Bal, DO;  Location: [facility];  Service: Endoscopy;  Laterality: N/A;   HARDWARE REMOVAL Right 03/20/2021   Procedure: HARDWARE REMOVAL right hip;  Surgeon: Vickki Hearing, MD;  Location: [facility];  Service: Orthopedics;  Laterality: Right;   HIP PINNING,CANNULATED Right 03/10/2020   Procedure: INTERNAL FIXATION RIGHT HIP;  Surgeon: Vickki Hearing, MD;   Location: [facility];  Service: Orthopedics;  Laterality: Right;   ORIF WRIST FRACTURE  12/19/2011   Procedure: OPEN REDUCTION INTERNAL FIXATION (ORIF) WRIST FRACTURE;  Surgeon: Vickki Hearing, MD;  Location: [facility];  Service: Orthopedics;  Laterality: Right;   prostate cancer     Diagnosed in the 90s   TONSILLECTOMY      Social History   Socioeconomic History   Marital status: Widowed    Spouse name: Not on file   Number of children: Not on file   Years of education: Not on file   Highest education level: Not on file  Occupational History   Occupation: retired    Associate Professor: RETIRED  Tobacco Use   Smoking status: Never   Smokeless tobacco: Never  Vaping Use   Vaping status: Never Used  Substance and Sexual Activity   Alcohol use: No   Drug use: No   Sexual activity: Not Currently  Other Topics Concern   Not on file  Social History Narrative   Is long term resident of [facility]    Social Determinants of Health   Financial Resource Strain: Not on file  Food Insecurity: Not on file  Transportation Needs: Not on file  Physical Activity: Not on file  Stress: Not on file  Social Connections: Not on file  Intimate Partner Violence: Not on file   Family History  Problem Relation Age of Onset   Cancer Other    Heart attack Mother    Cancer Brother    Heart attack Brother    Heart disease Sister        by pass      VITAL SIGNS BP (!) 174/78   Pulse (!) 56   Temp 98.4 F (36.9 C)   Resp 20   Ht 5\' 9"  (1.753 m)   Wt 157 lb 12.8 oz (71.6 kg)   SpO2 98%   BMI 23.30 kg/m   Outpatient Encounter Medications as of 01/21/2023  Medication Sig   acetaminophen (TYLENOL) 500 MG tablet Take 1,000 mg by mouth 3 (three) times daily. Max 3000mg  in 24 hour period   [employer] (VENELEX) OINT Apply topically. Apply to sacrum, coccyx and bilateral buttocks qshift.   busPIRone (BUSPAR) 10 MG tablet Take 10 mg by mouth 3 (three) times daily.   dilTIAZem HCl ER 300 MG TB24  Take 1 capsule by mouth daily.   hydrALAZINE (APRESOLINE) 10 MG tablet Take 10 mg by mouth 3 (three) times daily. Hypertensive chronic kidney disease with stage 1 through stage 4 chronic kidney disease, or unspecified chronic kidney disease   isosorbide dinitrate (ISORDIL) 30 MG tablet Take 30 mg by mouth 2 (two) times daily.   lisinopril (ZESTRIL) 40 MG tablet Take 40 mg by mouth daily. Hypertensive chronic kidney disease with stage 1 through stage 4 chronic kidney disease, or unspecified chronic kidney disease   metoprolol tartrate (LOPRESSOR) 25 MG tablet Take 1 tablet (25 mg total) by mouth 2 (two) times daily.  Nutritional Supplements (ENSURE ENLIVE PO) Take 1 Can by mouth 2 (two) times daily.   omeprazole (PRILOSEC) 20 MG capsule Take 1 capsule (20 mg total) by mouth daily before breakfast.   polyethylene glycol (MIRALAX / GLYCOLAX) 17 g packet Take 17 g by mouth 2 (two) times daily.   potassium chloride (MICRO-K) 10 MEQ CR capsule Take 40 mEq by mouth daily.   QUEtiapine (SEROQUEL) 25 MG tablet Take 0.5 tablets (12.5 mg total) by mouth 2 (two) times daily as needed.   rOPINIRole (REQUIP) 0.25 MG tablet Take 0.25 mg by mouth at bedtime.   saline (AYR) GEL Place 1 Application into both nostrils every 6 (six) hours as needed.   traZODone (DESYREL) 50 MG tablet Take 25 mg by mouth at bedtime.   UNABLE TO FIND Diet: DYS 3/Thin Liquids   venlafaxine (EFFEXOR) 75 MG tablet Take 75 mg by mouth 2 (two) times daily with a meal.   No facility-administered encounter medications on file as of 01/21/2023.     SIGNIFICANT DIAGNOSTIC EXAMS  PREVIOUS   01-23-22: dexa: t score -1.331  NO NEW EXAMS    LABS REVIEWED PREVIOUS      01-31-22: glucose 100; bun 20; creat 0.94; k+ 3.5; na++ 143; ca 9.5 gfr >60 02-11-22: glucose 102; bun 19; creat 0.91; k+ 3.3; na++ 141; ca 9.3; gfr >60 02-18-22: glucose 114; bun 27; creat 1.10; k+ 3.8; na++ 141; ca 9.3; gfr >60 04-01-22; wbc 6.9; hgb 13.3; hct 41.0; mcv  86.9 plt 174; glucose 115; bun 37; creat 1.07; k+ 4.1; na++ 144; ca 9.7; gfr >60; protein 6.0 albumin 3.5; tsh 0.157 vitamin B12; 856;  04-02-22: free t4: 0.80; RPR negative 04-05-22: wbc 6.6; hgb 12.8; hct 39.1; mcv 86.3 plt 196; glucose 140; bun 28; creat 1.14; k+ 4.2; na++ 142; ca 9.9; gfr >60; protein 6.4; albumin 3.7 05-29-22: wb 6.7; hgb 13.8; hct 42.8; mcv 85.4 plt 205; glucose 142; bun 18; creat 1.09; k+ 3.5; na++ 141; ca 9.5; gfr >60   06-24-22: hgb 13.0; hct 40.4; tsh 1.153 free t4: 0.95  12-03-22: uric acid: 5.2 12-07-22: wbc 7.3; hgb 13.8; hct 41.5; mcv 87.4 plt 210; glucose 133; bun 24; creat 0.88; k+ 34; na++ 139; ca 9.4 gfr >60 protein 6.4 albumin 3.4   TODAY  01-21-23: wbc 9.9; hgb 13.8; hct 43.7; mcv 89.2 plt 266; glucose 125; bun 26; creat 0.91; k+ 3.2; na++ 140; ca 9.5; gfr >60; d-dimer 0.48    Review of Systems  Unable to perform ROS: Dementia    Physical Exam Constitutional:      General: He is not in acute distress.    Appearance: He is well-developed. He is not diaphoretic.  Neck:     Thyroid: No thyromegaly.  Cardiovascular:     Rate and Rhythm: Normal rate and regular rhythm.     Pulses: Normal pulses.     Heart sounds: Normal heart sounds.  Pulmonary:     Effort: Pulmonary effort is normal. No respiratory distress.     Breath sounds: Normal breath sounds.  Abdominal:     General: Bowel sounds are normal. There is no distension.     Palpations: Abdomen is soft.     Tenderness: There is no abdominal tenderness.  Musculoskeletal:        General: Normal range of motion.     Cervical back: Neck supple.  Lymphadenopathy:     Cervical: No cervical adenopathy.  Skin:    General: Skin is warm and dry.  Neurological:     Mental Status: He is alert. Mental status is at baseline.  Psychiatric:        Mood and Affect: Mood normal.       ASSESSMENT/ PLAN:  TODAY  Sars -CoV2 positive antibodies Hypertension with stage 11 chronic renal disease Severe  vascular dementia with agitation  Will increase seroquel to 12.5 mg in the AM and 25 mg in the PM Will complete molnupiravir Will begin clonidine 0.1 mg daily Will give k+ 40 one time    Synthia Innocent NP [facility]  call [phone]

## 2023-01-22 DIAGNOSIS — R768 Other specified abnormal immunological findings in serum: Secondary | ICD-10-CM | POA: Insufficient documentation

## 2023-02-05 ENCOUNTER — Non-Acute Institutional Stay (SKILLED_NURSING_FACILITY): Payer: Medicare Other | Admitting: Adult Health

## 2023-02-05 ENCOUNTER — Encounter: Payer: Self-pay | Admitting: Adult Health

## 2023-02-05 DIAGNOSIS — C61 Malignant neoplasm of prostate: Secondary | ICD-10-CM

## 2023-02-05 DIAGNOSIS — K5909 Other constipation: Secondary | ICD-10-CM

## 2023-02-05 DIAGNOSIS — Z66 Do not resuscitate: Secondary | ICD-10-CM | POA: Diagnosis not present

## 2023-02-05 DIAGNOSIS — N3281 Overactive bladder: Secondary | ICD-10-CM | POA: Diagnosis not present

## 2023-02-05 NOTE — Progress Notes (Signed)
Location:  [facility] Room Number: 156-D Place of Service:  SNF (31)   CODE STATUS: DNR  Allergies  Allergen Reactions   Hctz [Hydrochlorothiazide]     hypokalemia   Xanax [Alprazolam] Other (See Comments)    Causes hallucinations     Chief Complaint  Patient presents with   Medical Management of Chronic Issues                      Prostate cancer/over active bladder:    Hypokalemia: Chronic constipation:   CKD stage 3b     HPI:  Jeffrey Sherman is a 89 year old long term resident of this facility being seen for the management of his chronic illnesses:  Prostate cancer/over active bladder:    Hypokalemia: Chronic constipation:   CKD stage 3b. Jeffrey Sherman has been treated for COVID this pas month without complications. Jeffrey Sherman continues to have falls without injuries. There are no indications of uncontrolled pain present.   Past Medical History:  Diagnosis Date   Anxiety    Biliary dyskinesia    Cancer [facility])    prostate   Chronic chest wall pain    Closed displaced fracture of right femoral neck (HCC) 03/09/2020   9/30-10/10/2019 minimally displaced right femoral neck fracture sustained in a mechanical fall  Cannulated right hip pinning by Dr. Fuller Canada 10/1  Full dose aspirin prophylaxis through 11/5 with subsequent dosage of 81 mg daily.   Epididymitis 11/2021   Bilateral [facility]   Essential hypertension, benign 06/20/2016   Failure to thrive in adult 08/14/2022   GAD (generalized anxiety disorder)    GERD (gastroesophageal reflux disease)    H/O echocardiogram 2016   normal   Hallucination 08/31/2021   Heart murmur    "leaking heart valve"   High cholesterol 06/20/2016   HOH (hard of hearing)    left   Hx of falling    Hyperthyroiditis 04/04/2022   Inguinal hernia 01/15/2022   12/17/2021 Urology PA documented small right inguinal hernia which was easily reducible.  General surgery assessment scheduled for 01/29/2022.  There is a discrepancy between the  severity of his complaints related to this and the physical findings.  Jeffrey Sherman would be a high risk surgical candidate because of advanced age and other comorbidities.   Interstitial lung disease (HCC) 03/15/2020   03/12/2020 chronic interstitial changes stable compared to 2019 suggesting chronic interstitial lung disease.    Patient and family deny knowledge of this diagnosis; Jeffrey Sherman has never been on maintenance oxygen.  Patient has never smoked.  Jeffrey Sherman worked for over 2 decades at a [employer] and on one occasion was exposed to hydrochloric acid fumes.   Interstitial pulmonary disease (HCC)    Iron deficiency anemia due to dietary causes 03/22/2020   Low serum thyroid stimulating hormone (TSH) 08/17/2021   Lower GI bleed    MDD (major depressive disorder)    Murmur, cardiac    Poor historian    Prostate cancer (HCC)    PUD (peptic ulcer disease)    Right inguinal pain 01/29/2022   RLS (restless legs syndrome)    SARS-CoV-2 positive 09/13/2021   Shortness of breath    Vascular dementia without behavioral disturbance (HCC) 07/05/2022   Vitamin B 12 deficiency 09/03/2021   Wrist fracture 12/16/2011    Past Surgical History:  Procedure Laterality Date   CHOLECYSTECTOMY N/A 06/28/2020   Procedure: LAPAROSCOPIC CHOLECYSTECTOMY;  Surgeon: Lucretia Roers, MD;  Location: [facility];  Service: General;  Laterality: N/A;  pt in [facility]   COLONOSCOPY  2005   Rehman: Sigmoid colon diverticulosis, moderate sized external hemorrhoids. A few focal areas of mucosal erythema felt to be nonspecific.   ESOPHAGOGASTRODUODENOSCOPY     ESOPHAGOGASTRODUODENOSCOPY N/A 07/18/2016   Rehman: normal exam except duodenal scar. h.pylori serologies negative   FLEXIBLE SIGMOIDOSCOPY N/A 06/11/2020   Procedure: FLEXIBLE SIGMOIDOSCOPY;  Surgeon: Lanelle Bal, DO;  Location: [facility];  Service: Endoscopy;  Laterality: N/A;   HARDWARE REMOVAL Right 03/20/2021   Procedure: HARDWARE REMOVAL right hip;   Surgeon: Vickki Hearing, MD;  Location: [facility];  Service: Orthopedics;  Laterality: Right;   HIP PINNING,CANNULATED Right 03/10/2020   Procedure: INTERNAL FIXATION RIGHT HIP;  Surgeon: Vickki Hearing, MD;  Location: [facility];  Service: Orthopedics;  Laterality: Right;   ORIF WRIST FRACTURE  12/19/2011   Procedure: OPEN REDUCTION INTERNAL FIXATION (ORIF) WRIST FRACTURE;  Surgeon: Vickki Hearing, MD;  Location: [facility];  Service: Orthopedics;  Laterality: Right;   prostate cancer     Diagnosed in the 90s   TONSILLECTOMY      Social History   Socioeconomic History   Marital status: Widowed    Spouse name: Not on file   Number of children: Not on file   Years of education: Not on file   Highest education level: Not on file  Occupational History   Occupation: retired    Associate Professor: RETIRED  Tobacco Use   Smoking status: Never   Smokeless tobacco: Never  Vaping Use   Vaping status: Never Used  Substance and Sexual Activity   Alcohol use: No   Drug use: No   Sexual activity: Not Currently  Other Topics Concern   Not on file  Social History Narrative   Is long term resident of [facility]    Social Determinants of Health   Financial Resource Strain: Not on file  Food Insecurity: Not on file  Transportation Needs: Not on file  Physical Activity: Not on file  Stress: Not on file  Social Connections: Not on file  Intimate Partner Violence: Not on file   Family History  Problem Relation Age of Onset   Cancer Other    Heart attack Mother    Cancer Brother    Heart attack Brother    Heart disease Sister        by pass      VITAL SIGNS BP (!) 122/54   Pulse 62   Temp (!) 97 F (36.1 C)   Ht 5\' 9"  (1.753 m)   Wt 157 lb (71.2 kg)   SpO2 96%   BMI 23.18 kg/m   Outpatient Encounter Medications as of 02/05/2023  Medication Sig   acetaminophen (TYLENOL) 500 MG tablet Take 1,000 mg by mouth 3 (three) times daily. Max 3000mg  in 24 hour period   [employer]  (VENELEX) OINT Apply topically. Apply to sacrum, coccyx and bilateral buttocks qshift.   busPIRone (BUSPAR) 10 MG tablet Take 10 mg by mouth 3 (three) times daily.   cloNIDine (CATAPRES) 0.1 MG tablet Take 0.1 mg by mouth daily.   dilTIAZem HCl ER 300 MG TB24 Take 1 capsule by mouth daily.   hydrALAZINE (APRESOLINE) 25 MG tablet Take 25 mg by mouth 3 (three) times daily.   isosorbide dinitrate (ISORDIL) 30 MG tablet Take 30 mg by mouth 2 (two) times daily.   lisinopril (ZESTRIL) 40 MG tablet Take 40 mg by mouth daily. Hypertensive chronic  kidney disease with stage 1 through stage 4 chronic kidney disease, or unspecified chronic kidney disease   metoprolol tartrate (LOPRESSOR) 50 MG tablet Take 50 mg by mouth 2 (two) times daily.   Nutritional Supplements (ENSURE ENLIVE PO) Take 1 Can by mouth 2 (two) times daily.   omeprazole (PRILOSEC) 20 MG capsule Take 1 capsule (20 mg total) by mouth daily before breakfast.   polyethylene glycol (MIRALAX / GLYCOLAX) 17 g packet Take 17 g by mouth 2 (two) times daily.   potassium chloride (MICRO-K) 10 MEQ CR capsule Take 40 mEq by mouth daily.   QUEtiapine (SEROQUEL) 25 MG tablet Take 12.5 mg by mouth daily. 9 am and 25 mg in the PM    saline (AYR) GEL Place 1 Application into both nostrils every 6 (six) hours as needed.   UNABLE TO FIND Diet: DYS 3/Thin Liquids   venlafaxine (EFFEXOR) 75 MG tablet Take 75 mg by mouth 2 (two) times daily with a meal.   No facility-administered encounter medications on file as of 02/05/2023.     SIGNIFICANT DIAGNOSTIC EXAMS  PREVIOUS   01-23-22: dexa: t score -1.331  NO NEW EXAMS    LABS REVIEWED PREVIOUS      01-31-22: glucose 100; bun 20; creat 0.94; k+ 3.5; na++ 143; ca 9.5 gfr >60 02-11-22: glucose 102; bun 19; creat 0.91; k+ 3.3; na++ 141; ca 9.3; gfr >60 02-18-22: glucose 114; bun 27; creat 1.10; k+ 3.8; na++ 141; ca 9.3; gfr >60 04-01-22; wbc 6.9; hgb 13.3; hct 41.0; mcv 86.9 plt 174; glucose 115; bun 37; creat  1.07; k+ 4.1; na++ 144; ca 9.7; gfr >60; protein 6.0 albumin 3.5; tsh 0.157 vitamin B12; 856;  04-02-22: free t4: 0.80; RPR negative 04-05-22: wbc 6.6; hgb 12.8; hct 39.1; mcv 86.3 plt 196; glucose 140; bun 28; creat 1.14; k+ 4.2; na++ 142; ca 9.9; gfr >60; protein 6.4; albumin 3.7 05-29-22: wb 6.7; hgb 13.8; hct 42.8; mcv 85.4 plt 205; glucose 142; bun 18; creat 1.09; k+ 3.5; na++ 141; ca 9.5; gfr >60   06-24-22: hgb 13.0; hct 40.4; tsh 1.153 free t4: 0.95  12-03-22: uric acid: 5.2 12-07-22: wbc 7.3; hgb 13.8; hct 41.5; mcv 87.4 plt 210; glucose 133; bun 24; creat 0.88; k+ 34; na++ 139; ca 9.4 gfr >60 protein 6.4 albumin 3.4  01-21-23: wbc 9.9; hgb 13.8; hct 43.7; mcv 89.2 plt 266; glucose 125; bun 26; creat 0.91; k+ 3.2; na++ 140; ca 9.5; gfr >60; d-dimer 0.48  NO NEW LABS.    Review of Systems  Unable to perform ROS: Dementia   Physical Exam Constitutional:      General: Jeffrey Sherman is not in acute distress.    Appearance: Jeffrey Sherman is well-developed. Jeffrey Sherman is not diaphoretic.  Neck:     Thyroid: No thyromegaly.  Cardiovascular:     Rate and Rhythm: Normal rate and regular rhythm.     Pulses: Normal pulses.     Heart sounds: Normal heart sounds.  Pulmonary:     Effort: Pulmonary effort is normal. No respiratory distress.     Breath sounds: Normal breath sounds.  Abdominal:     General: Bowel sounds are normal. There is no distension.     Palpations: Abdomen is soft.     Tenderness: There is no abdominal tenderness.  Musculoskeletal:        General: Normal range of motion.     Cervical back: Neck supple.     Right lower leg: No edema.     Left  lower leg: No edema.  Lymphadenopathy:     Cervical: No cervical adenopathy.  Skin:    General: Skin is warm and dry.  Neurological:     Mental Status: Jeffrey Sherman is alert. Mental status is at baseline.     Comments: 07-11-22: SLUMS 3/30  Psychiatric:        Mood and Affect: Mood normal.     ASSESSMENT/ PLAN:  TODAY  Prostate cancer/over active bladder: is  not on medications due to history of falls; unable to take myrbetriq due to hypertension  2. Hypokalemia: k+ 3.5 will continue k+ 40 meq daily   3. Chronic constipation: will continue miralax twice daily   4. CKD stage 3b bun 26 creat 0.91 gfr >60   PREVIOUS     5. Major depression recurrent chronic: will continue effexor 75 mg twice daily will continue seroquel 12.5 mg in AM and 25 mg in the PM   6. RLS is off requip  7. Iron deficiency anemia due to dietary intake hgb 13.8 is off iron  8. Interstitial lung disease: will monitor   9. Vitamin B 12 deficiency: will monitor  10. Osteopenia: t score -1.331  11. Left hip pain: will continue tylenol 1 gm three times daily   12. Dementia without behavioral disturbance; unspecified dementia type/failure to thrive in adult: weight is 157 pounds.   13. Aortic atherosclerosis (ct 06-09-20) no statin due to advanced age  68. Protein calorie malnutrition: albumin 3.7 will monitor   15. Benign hypertension with chronic kidney disease stage III: b/p 122/54 will continue lopressor 50 mg twice daily cardizem er 300 mg daily lisinopril 40 mg daily hydralazine 25 mg three times daily isordil 30 mg twice daily   16. Gastroesophageal reflux disease without esophagitis: will continue prilosec 20 mg daily   Will check cmp; tsh     Synthia Innocent NP [facility]  call [phone]

## 2023-02-06 ENCOUNTER — Other Ambulatory Visit (HOSPITAL_COMMUNITY)
Admission: RE | Admit: 2023-02-06 | Discharge: 2023-02-06 | Disposition: A | Discharge status: Home / Self Care | Payer: Medicare Other | Source: Skilled Nursing Facility | Attending: Adult Health | Admitting: Adult Health

## 2023-02-06 DIAGNOSIS — E039 Hypothyroidism, unspecified: Secondary | ICD-10-CM | POA: Diagnosis not present

## 2023-02-06 DIAGNOSIS — N182 Chronic kidney disease, stage 2 (mild): Secondary | ICD-10-CM | POA: Diagnosis present

## 2023-02-06 LAB — COMPREHENSIVE METABOLIC PANEL
ALT: 16 U/L (ref 0–44)
AST: 11 U/L — ABNORMAL LOW (ref 15–41)
Albumin: 2.8 g/dL — ABNORMAL LOW (ref 3.5–5.0)
Alkaline Phosphatase: 85 U/L (ref 38–126)
Anion gap: 7 (ref 5–15)
BUN: 52 mg/dL — ABNORMAL HIGH (ref 8–23)
CO2: 24 mmol/L (ref 22–32)
Calcium: 9.1 mg/dL (ref 8.9–10.3)
Chloride: 108 mmol/L (ref 98–111)
Creatinine, Ser: 1.27 mg/dL — ABNORMAL HIGH (ref 0.61–1.24)
GFR, Estimated: 53 mL/min — ABNORMAL LOW (ref >60)
Glucose, Bld: 99 mg/dL (ref 70–99)
Potassium: 4.3 mmol/L (ref 3.5–5.1)
Sodium: 139 mmol/L (ref 135–145)
Total Bilirubin: 0.5 mg/dL (ref 0.3–1.2)
Total Protein: 5.5 g/dL — ABNORMAL LOW (ref 6.5–8.1)

## 2023-02-06 LAB — TSH: TSH: 0.64 u[IU]/mL (ref 0.350–4.500)

## 2023-03-03 ENCOUNTER — Non-Acute Institutional Stay: Payer: Self-pay | Admitting: Student

## 2023-03-03 DIAGNOSIS — R296 Repeated falls: Secondary | ICD-10-CM | POA: Insufficient documentation

## 2023-03-03 NOTE — Assessment & Plan Note (Addendum)
Recurrent issue. Almost always while leaning forward in wheelchair. Is already receiving PT services. He was able to demonstrate a transfer from the chair to the bed for [place] but did forget to lock the chair at first.  There is no evidence of acute injury from the most recent fall, but he is certainly at an increased risk for fractures given his documented osteopenia and overall frailty with these frequent falls.  - Will discuss with PT to see if we can obtain a knee/thigh riser for his wheelchair to prevent these forward fall events.

## 2023-03-03 NOTE — Addendum Note (Signed)
Addended by: Sharee Holster on: 03/03/2023 12:35 PM   Modules accepted: Level of Service

## 2023-03-03 NOTE — Progress Notes (Signed)
Location:  [facility]   Place of Service:   Patient's room 156-D    CODE STATUS: DNR  Allergies  Allergen Reactions   Hctz [Hydrochlorothiazide]     hypokalemia   Xanax [Alprazolam] Other (See Comments)    Causes hallucinations     No chief complaint on file.   HPI: Patient has a fall on 9/21 where he was found by nursing staff to be lying on his R side. He denied any acute pain or injury at the time of the event. He falls frequently. Before the fall on 9/21 he also fell on 9/17, 8/25, 8/24, and 8/15.  On review of documentation from nursing staff, it seems that he frequently falls out of his wheelchair when leaning forward.  Of note, he is comfort care.   Past Medical History:  Diagnosis Date   Anxiety    Biliary dyskinesia    Cancer [facility])    prostate   Chronic chest wall pain    Closed displaced fracture of right femoral neck (HCC) 03/09/2020   9/30-10/10/2019 minimally displaced right femoral neck fracture sustained in a mechanical fall  Cannulated right hip pinning by Dr. Fuller Canada 10/1  Full dose aspirin prophylaxis through 11/5 with subsequent dosage of 81 mg daily.   Epididymitis 11/2021   Bilateral [facility]   Essential hypertension, benign 06/20/2016   Failure to thrive in adult 08/14/2022   GAD (generalized anxiety disorder)    GERD (gastroesophageal reflux disease)    H/O echocardiogram 2016   normal   Hallucination 08/31/2021   Heart murmur    "leaking heart valve"   High cholesterol 06/20/2016   HOH (hard of hearing)    left   Hx of falling    Hyperthyroiditis 04/04/2022   Inguinal hernia 01/15/2022   12/17/2021 Urology PA documented small right inguinal hernia which was easily reducible.  General surgery assessment scheduled for 01/29/2022.  There is a discrepancy between the severity of his complaints related to this and the physical findings.  He would be a high risk surgical candidate because of advanced age and other  comorbidities.   Interstitial lung disease (HCC) 03/15/2020   03/12/2020 chronic interstitial changes stable compared to 2019 suggesting chronic interstitial lung disease.    Patient and family deny knowledge of this diagnosis; he has never been on maintenance oxygen.  Patient has never smoked.  He worked for over 2 decades at a [employer] and on one occasion was exposed to hydrochloric acid fumes.   Interstitial pulmonary disease (HCC)    Iron deficiency anemia due to dietary causes 03/22/2020   Low serum thyroid stimulating hormone (TSH) 08/17/2021   Lower GI bleed    MDD (major depressive disorder)    Murmur, cardiac    Poor historian    Prostate cancer (HCC)    PUD (peptic ulcer disease)    Right inguinal pain 01/29/2022   RLS (restless legs syndrome)    SARS-CoV-2 positive 09/13/2021   Shortness of breath    Vascular dementia without behavioral disturbance (HCC) 07/05/2022   Vitamin B 12 deficiency 09/03/2021   Wrist fracture 12/16/2011    Past Surgical History:  Procedure Laterality Date   CHOLECYSTECTOMY N/A 06/28/2020   Procedure: LAPAROSCOPIC CHOLECYSTECTOMY;  Surgeon: Lucretia Roers, MD;  Location: [facility];  Service: General;  Laterality: N/A;  pt in [facility]   COLONOSCOPY  2005   Rehman: Sigmoid colon diverticulosis, moderate sized external hemorrhoids. A few focal areas of mucosal  erythema felt to be nonspecific.   ESOPHAGOGASTRODUODENOSCOPY     ESOPHAGOGASTRODUODENOSCOPY N/A 07/18/2016   Rehman: normal exam except duodenal scar. h.pylori serologies negative   FLEXIBLE SIGMOIDOSCOPY N/A 06/11/2020   Procedure: FLEXIBLE SIGMOIDOSCOPY;  Surgeon: Lanelle Bal, DO;  Location: [facility];  Service: Endoscopy;  Laterality: N/A;   HARDWARE REMOVAL Right 03/20/2021   Procedure: HARDWARE REMOVAL right hip;  Surgeon: Vickki Hearing, MD;  Location: [facility];  Service: Orthopedics;  Laterality: Right;   HIP PINNING,CANNULATED Right 03/10/2020   Procedure:  INTERNAL FIXATION RIGHT HIP;  Surgeon: Vickki Hearing, MD;  Location: [facility];  Service: Orthopedics;  Laterality: Right;   ORIF WRIST FRACTURE  12/19/2011   Procedure: OPEN REDUCTION INTERNAL FIXATION (ORIF) WRIST FRACTURE;  Surgeon: Vickki Hearing, MD;  Location: [facility];  Service: Orthopedics;  Laterality: Right;   prostate cancer     Diagnosed in the 90s   TONSILLECTOMY      Social History   Socioeconomic History   Marital status: Widowed    Spouse name: Not on file   Number of children: Not on file   Years of education: Not on file   Highest education level: Not on file  Occupational History   Occupation: retired    Associate Professor: RETIRED  Tobacco Use   Smoking status: Never   Smokeless tobacco: Never  Vaping Use   Vaping status: Never Used  Substance and Sexual Activity   Alcohol use: No   Drug use: No   Sexual activity: Not Currently  Other Topics Concern   Not on file  Social History Narrative   Is long term resident of [facility]    Social Determinants of Health   Financial Resource Strain: Not on file  Food Insecurity: Not on file  Transportation Needs: Not on file  Physical Activity: Not on file  Stress: Not on file  Social Connections: Not on file  Intimate Partner Violence: Not on file   Family History  Problem Relation Age of Onset   Cancer Other    Heart attack Mother    Cancer Brother    Heart attack Brother    Heart disease Sister        by pass      VITAL SIGNS There were no vitals taken for this visit.  Physical Exam Constitutional:      General: He is not in acute distress.    Comments: Pleasantly confused and in good spirits. Demonstrated a transfer from the chair to the bed.   HENT:     Head:     Comments: There is a yellowed bruise to the left side of his head in the temporal region, not skin tears or lacerations.  Eyes:     General: No scleral icterus. Pulmonary:     Comments: Normal WOB on RA, speaking in full  sentences Musculoskeletal:     Comments: Without edema or deformity. All four extremities and major joints are nontender to palpation. Bilateral wrists, elbows, knees, and hips with full passive ROM without pain.       Outpatient Encounter Medications as of 03/03/2023  Medication Sig   acetaminophen (TYLENOL) 500 MG tablet Take 1,000 mg by mouth 3 (three) times daily. Max 3000mg  in 24 hour period   [employer] (VENELEX) OINT Apply topically. Apply to sacrum, coccyx and bilateral buttocks qshift.   busPIRone (BUSPAR) 10 MG tablet Take 10 mg by mouth 3 (three) times daily.   cloNIDine (CATAPRES) 0.1 MG tablet  Take 0.1 mg by mouth daily.   dilTIAZem HCl ER 300 MG TB24 Take 1 capsule by mouth daily.   hydrALAZINE (APRESOLINE) 25 MG tablet Take 25 mg by mouth 3 (three) times daily.   isosorbide dinitrate (ISORDIL) 30 MG tablet Take 30 mg by mouth 2 (two) times daily.   lisinopril (ZESTRIL) 40 MG tablet Take 40 mg by mouth daily. Hypertensive chronic kidney disease with stage 1 through stage 4 chronic kidney disease, or unspecified chronic kidney disease   metoprolol tartrate (LOPRESSOR) 50 MG tablet Take 50 mg by mouth 2 (two) times daily.   Nutritional Supplements (ENSURE ENLIVE PO) Take 1 Can by mouth 2 (two) times daily.   omeprazole (PRILOSEC) 20 MG capsule Take 1 capsule (20 mg total) by mouth daily before breakfast.   polyethylene glycol (MIRALAX / GLYCOLAX) 17 g packet Take 17 g by mouth 2 (two) times daily.   potassium chloride (MICRO-K) 10 MEQ CR capsule Take 40 mEq by mouth daily.   QUEtiapine (SEROQUEL) 25 MG tablet Take 12.5 mg by mouth daily. 9 am   saline (AYR) GEL Place 1 Application into both nostrils every 6 (six) hours as needed.   UNABLE TO FIND Diet: DYS 3/Thin Liquids   venlafaxine (EFFEXOR) 75 MG tablet Take 75 mg by mouth 2 (two) times daily with a meal.   No facility-administered encounter medications on file as of 03/03/2023.     SIGNIFICANT DIAGNOSTIC  EXAMS  No new imaging or labs.      ASSESSMENT/ PLAN:  Frequent falls Recurrent issue. Almost always while leaning forward in wheelchair. Is already receiving PT services. Has fall precautions on board.  There is no evidence of acute injury from the most recent fall, but he is certainly at an increased risk for fractures given his documented osteopenia and overall frailty with these frequent falls.  - Will discuss with PT to see if we can obtain a knee/thigh riser for his wheelchair to prevent these forward fall events.   Comfort Measures Only Patient noted to have rising Creatinine and BUN on his last labwork from 8/29. But given that he is comfort measures only, would not draw any further labs or work this up any further.   Eliezer Mccoy, MD

## 2023-03-05 ENCOUNTER — Encounter: Payer: Self-pay | Admitting: Internal Medicine

## 2023-03-05 ENCOUNTER — Non-Acute Institutional Stay (SKILLED_NURSING_FACILITY): Payer: Self-pay | Admitting: Internal Medicine

## 2023-03-05 DIAGNOSIS — C61 Malignant neoplasm of prostate: Secondary | ICD-10-CM | POA: Diagnosis not present

## 2023-03-05 DIAGNOSIS — I1A0 Resistant hypertension: Secondary | ICD-10-CM

## 2023-03-05 DIAGNOSIS — F01518 Vascular dementia, unspecified severity, with other behavioral disturbance: Secondary | ICD-10-CM | POA: Diagnosis not present

## 2023-03-05 DIAGNOSIS — R7989 Other specified abnormal findings of blood chemistry: Secondary | ICD-10-CM

## 2023-03-05 NOTE — Patient Instructions (Signed)
See assessment and plan under each diagnosis in the problem list and acutely for this visit

## 2023-03-05 NOTE — Assessment & Plan Note (Addendum)
TSH is 0.64, down from 1.153 in January.  In October 2023 TSH was 0.157.  Serial monitor again necessary to rule out subclinical hyperthyroidism.

## 2023-03-05 NOTE — Progress Notes (Unsigned)
   NURSING HOME LOCATION:  [facility] ROOM NUMBER:  [id number] D  CODE STATUS:  DNR  PCP:  Synthia Innocent NP  This is a nursing facility follow up visit of chronic medical diagnoses & to document compliance with Regulation 483.30 (c) in The Long Term Care Survey Manual Phase 2 which mandates caregiver visit ( visits can alternate among physician, PA or NP as per statutes) within 10 days of 30 days / 60 days/ 90 days post admission to SNF date    Interim medical record and care since last SNF visit was updated with review of diagnostic studies and change in clinical status since last visit were documented.  HPI: He is a permanent resident of this facility with adult failure to thrive syndrome, history of prostate cancer, essential hypertension, GAD, GERD, dyslipidemia, interstitial lung disease, major depression disorder, history of B12 deficiency, and vascular dementia with behavioral disturbance.  Labs were performed last month because of COVID positivity.  SNF C-19 protocol was followed. These revealed a normal CBC and CKD stage IIIa with a creatinine 1.27 and eGFR of 53, down from prior values of 0.91 and eGFR greater than 60.  BUN has risen from 26-52 indicating a prerenal component to his CKD. D-dimer was 0.48; C-reactive protein was not performed. TSH was 0.640 ,down from 1.153 in January.  Review of systems: Dementia invalidated responses. Date given as   Constitutional: No fever, significant weight change, fatigue  Eyes: No redness, discharge, pain, vision change ENT/mouth: No nasal congestion,  purulent discharge, earache, change in hearing, sore throat  Cardiovascular: No chest pain, palpitations, paroxysmal nocturnal dyspnea, claudication, edema  Respiratory: No cough, sputum production, hemoptysis, DOE, significant snoring, apnea   Gastrointestinal: No heartburn, dysphagia, abdominal pain, nausea /vomiting, rectal bleeding, melena, change in bowels Genitourinary: No  dysuria, hematuria, pyuria, incontinence, nocturia Musculoskeletal: No joint stiffness, joint swelling, weakness, pain Dermatologic: No rash, pruritus, change in appearance of skin Neurologic: No dizziness, headache, syncope, seizures, numbness, tingling Psychiatric: No significant anxiety, depression, insomnia, anorexia Endocrine: No change in hair/skin/nails, excessive thirst, excessive hunger, excessive urination  Hematologic/lymphatic: No significant bruising, lymphadenopathy, abnormal bleeding Allergy/immunology: No itchy/watery eyes, significant sneezing, urticaria, angioedema  Physical exam:  Pertinent or positive findings: General appearance: Adequately nourished; no acute distress, increased work of breathing is present.   Lymphatic: No lymphadenopathy about the head, neck, axilla. Eyes: No conjunctival inflammation or lid edema is present. There is no scleral icterus. Ears:  External ear exam shows no significant lesions or deformities.   Nose:  External nasal examination shows no deformity or inflammation. Nasal mucosa are pink and moist without lesions, exudates Oral exam:  Lips and gums are healthy appearing. There is no oropharyngeal erythema or exudate. Neck:  No thyromegaly, masses, tenderness noted.    Heart:  Normal rate and regular rhythm. S1 and S2 normal without gallop, murmur, click, rub .  Lungs: Chest clear to auscultation without wheezes, rhonchi, rales, rubs. Abdomen: Bowel sounds are normal. Abdomen is soft and nontender with no organomegaly, hernias, masses. GU: Deferred  Extremities:  No cyanosis, clubbing, edema  Neurologic exam : Cn 2-7 intact Strength equal  in upper & lower extremities Balance, Rhomberg, finger to nose testing could not be completed due to clinical state Deep tendon reflexes are equal Skin: Warm & dry w/o tenting. No significant lesions or rash.  See summary under each active problem in the Problem List with associated updated  therapeutic plan

## 2023-03-05 NOTE — Assessment & Plan Note (Addendum)
He has significant systolic blood pressure elevations despite 5 drug therapy with clonidine 0.1 mg daily, diltiazem 300 mg daily hydralazine 25 mg 3 times daily, lisinopril 40 mg daily, and metoprolol 50 mg twice a day. Recorded blood pressures reviewed; systolic blood pressure has ranged from a low 114 up to high of 178.  The majority of the readings are significantly higher than 140.  Additionally he has bradycardia which limits options.  At this time clonidine 0.1 could be increased to twice a day and hydralazine increased to 50 mg 3 times daily.  Diltiazem and metoprolol cannot be increased due to the bradycardia.  Lisinopril is max dose.  He should be evaluated for secondary causes  of RESISTENT HTN particularly hyperaldosteronism.  Appropriate lab test will be scheduled.

## 2023-03-05 NOTE — Assessment & Plan Note (Signed)
It is possible that subclinical prostate cancer is contributing to his adult failure to thrive; but because of his advanced age and multiple comorbidities palliative status is appropriate.

## 2023-03-06 NOTE — Assessment & Plan Note (Signed)
Oreinted only to self. He continues to attempt to ambulate w/o help despite myriad falls. [facility] NP feels Effexor may be component in his resistant HTN , but behaviors exacerbate when dosage weaning attempted.

## 2023-03-11 ENCOUNTER — Non-Acute Institutional Stay (SKILLED_NURSING_FACILITY): Payer: Medicare Other | Admitting: Adult Health

## 2023-03-11 ENCOUNTER — Encounter: Payer: Self-pay | Admitting: Adult Health

## 2023-03-11 DIAGNOSIS — F03911 Unspecified dementia, unspecified severity, with agitation: Secondary | ICD-10-CM

## 2023-03-11 DIAGNOSIS — F01518 Vascular dementia, unspecified severity, with other behavioral disturbance: Secondary | ICD-10-CM

## 2023-03-11 NOTE — Progress Notes (Unsigned)
Location:  [facility] Room Number: 156 Place of Service:  SNF (31)   CODE STATUS: dnr   Allergies  Allergen Reactions   Hctz [Hydrochlorothiazide]     hypokalemia   Xanax [Alprazolam] Other (See Comments)    Causes hallucinations     Chief Complaint  Patient presents with   Acute Visit    Behaviors     HPI:  He vascular dementia. The focus of his care is for comfort. His family is wanting that he be as "happy as possible". He has had more agitation present. He is verbally aggressive and physically aggressive with staff members. He is not sleeping well at night. His behaviors usually start in the late afternoon.   Past Medical History:  Diagnosis Date   Anxiety    Biliary dyskinesia    Cancer [facility])    prostate   Chronic chest wall pain    Closed displaced fracture of right femoral neck (HCC) 03/09/2020   9/30-10/10/2019 minimally displaced right femoral neck fracture sustained in a mechanical fall  Cannulated right hip pinning by Dr. Fuller Canada 10/1  Full dose aspirin prophylaxis through 11/5 with subsequent dosage of 81 mg daily.   Epididymitis 11/2021   Bilateral [facility]   Essential hypertension, benign 06/20/2016   Failure to thrive in adult 08/14/2022   GAD (generalized anxiety disorder)    GERD (gastroesophageal reflux disease)    H/O echocardiogram 2016   normal   Hallucination 08/31/2021   Heart murmur    "leaking heart valve"   High cholesterol 06/20/2016   HOH (hard of hearing)    left   Hx of falling    Hyperthyroiditis 04/04/2022   Inguinal hernia 01/15/2022   12/17/2021 Urology PA documented small right inguinal hernia which was easily reducible.  General surgery assessment scheduled for 01/29/2022.  There is a discrepancy between the severity of his complaints related to this and the physical findings.  He would be a high risk surgical candidate because of advanced age and other comorbidities.   Interstitial lung  disease (HCC) 03/15/2020   03/12/2020 chronic interstitial changes stable compared to 2019 suggesting chronic interstitial lung disease.    Patient and family deny knowledge of this diagnosis; he has never been on maintenance oxygen.  Patient has never smoked.  He worked for over 2 decades at a [employer] and on one occasion was exposed to hydrochloric acid fumes.   Interstitial pulmonary disease (HCC)    Iron deficiency anemia due to dietary causes 03/22/2020   Low serum thyroid stimulating hormone (TSH) 08/17/2021   Lower GI bleed    MDD (major depressive disorder)    Murmur, cardiac    Poor historian    Prostate cancer (HCC)    PUD (peptic ulcer disease)    Right inguinal pain 01/29/2022   RLS (restless legs syndrome)    SARS-CoV-2 positive 09/13/2021   Shortness of breath    Vascular dementia without behavioral disturbance (HCC) 07/05/2022   Vitamin B 12 deficiency 09/03/2021   Wrist fracture 12/16/2011    Past Surgical History:  Procedure Laterality Date   CHOLECYSTECTOMY N/A 06/28/2020   Procedure: LAPAROSCOPIC CHOLECYSTECTOMY;  Surgeon: Lucretia Roers, MD;  Location: [facility];  Service: General;  Laterality: N/A;  pt in [facility]   COLONOSCOPY  2005   Rehman: Sigmoid colon diverticulosis, moderate sized external hemorrhoids. A few focal areas of mucosal erythema felt to be nonspecific.   ESOPHAGOGASTRODUODENOSCOPY  ESOPHAGOGASTRODUODENOSCOPY N/A 07/18/2016   Rehman: normal exam except duodenal scar. h.pylori serologies negative   FLEXIBLE SIGMOIDOSCOPY N/A 06/11/2020   Procedure: FLEXIBLE SIGMOIDOSCOPY;  Surgeon: Lanelle Bal, DO;  Location: [facility];  Service: Endoscopy;  Laterality: N/A;   HARDWARE REMOVAL Right 03/20/2021   Procedure: HARDWARE REMOVAL right hip;  Surgeon: Vickki Hearing, MD;  Location: [facility];  Service: Orthopedics;  Laterality: Right;   HIP PINNING,CANNULATED Right 03/10/2020   Procedure: INTERNAL FIXATION RIGHT HIP;   Surgeon: Vickki Hearing, MD;  Location: [facility];  Service: Orthopedics;  Laterality: Right;   ORIF WRIST FRACTURE  12/19/2011   Procedure: OPEN REDUCTION INTERNAL FIXATION (ORIF) WRIST FRACTURE;  Surgeon: Vickki Hearing, MD;  Location: [facility];  Service: Orthopedics;  Laterality: Right;   prostate cancer     Diagnosed in the 90s   TONSILLECTOMY      Social History   Socioeconomic History   Marital status: Widowed    Spouse name: Not on file   Number of children: Not on file   Years of education: Not on file   Highest education level: Not on file  Occupational History   Occupation: retired    Associate Professor: RETIRED  Tobacco Use   Smoking status: Never   Smokeless tobacco: Never  Vaping Use   Vaping status: Never Used  Substance and Sexual Activity   Alcohol use: No   Drug use: No   Sexual activity: Not Currently  Other Topics Concern   Not on file  Social History Narrative   Is long term resident of [facility]    Social Determinants of Health   Financial Resource Strain: Not on file  Food Insecurity: Not on file  Transportation Needs: Not on file  Physical Activity: Not on file  Stress: Not on file  Social Connections: Not on file  Intimate Partner Violence: Not on file   Family History  Problem Relation Age of Onset   Cancer Other    Heart attack Mother    Cancer Brother    Heart attack Brother    Heart disease Sister        by pass      VITAL SIGNS BP (!) 143/70   Pulse 82   Temp 97.9 F (36.6 C)   Resp 16   Ht 5\' 9"  (1.753 m)   Wt 154 lb 3.2 oz (69.9 kg)   SpO2 97%   BMI 22.77 kg/m   Outpatient Encounter Medications as of 03/11/2023  Medication Sig   acetaminophen (TYLENOL) 500 MG tablet Take 1,000 mg by mouth 3 (three) times daily. Max 3000mg  in 24 hour period   [employer] (VENELEX) OINT Apply topically. Apply to sacrum, coccyx and bilateral buttocks qshift.   busPIRone (BUSPAR) 10 MG tablet Take 10 mg by mouth 3 (three) times daily.    cloNIDine (CATAPRES) 0.1 MG tablet Take 0.1 mg by mouth daily.   dilTIAZem HCl ER 300 MG TB24 Take 1 capsule by mouth daily.   hydrALAZINE (APRESOLINE) 25 MG tablet Take 25 mg by mouth 3 (three) times daily.   isosorbide dinitrate (ISORDIL) 30 MG tablet Take 30 mg by mouth 2 (two) times daily.   lisinopril (ZESTRIL) 40 MG tablet Take 40 mg by mouth daily. Hypertensive chronic kidney disease with stage 1 through stage 4 chronic kidney disease, or unspecified chronic kidney disease   metoprolol tartrate (LOPRESSOR) 50 MG tablet Take 50 mg by mouth 2 (two) times daily.   Nutritional Supplements (ENSURE  ENLIVE PO) Take 1 Can by mouth 2 (two) times daily.   omeprazole (PRILOSEC) 20 MG capsule Take 1 capsule (20 mg total) by mouth daily before breakfast.   polyethylene glycol (MIRALAX / GLYCOLAX) 17 g packet Take 17 g by mouth 2 (two) times daily.   potassium chloride (MICRO-K) 10 MEQ CR capsule Take 40 mEq by mouth daily.   QUEtiapine (SEROQUEL) 25 MG tablet Take 12.5 mg by mouth daily. 9 am   saline (AYR) GEL Place 1 Application into both nostrils every 6 (six) hours as needed.   UNABLE TO FIND Diet: DYS 3/Thin Liquids   venlafaxine (EFFEXOR) 75 MG tablet Take 75 mg by mouth 2 (two) times daily with a meal.   No facility-administered encounter medications on file as of 03/11/2023.     SIGNIFICANT DIAGNOSTIC EXAMS  PREVIOUS   01-23-22: dexa: t score -1.331  NO NEW EXAMS    LABS REVIEWED PREVIOUS      01-31-22: glucose 100; bun 20; creat 0.94; k+ 3.5; na++ 143; ca 9.5 gfr >60 02-11-22: glucose 102; bun 19; creat 0.91; k+ 3.3; na++ 141; ca 9.3; gfr >60 02-18-22: glucose 114; bun 27; creat 1.10; k+ 3.8; na++ 141; ca 9.3; gfr >60 04-01-22; wbc 6.9; hgb 13.3; hct 41.0; mcv 86.9 plt 174; glucose 115; bun 37; creat 1.07; k+ 4.1; na++ 144; ca 9.7; gfr >60; protein 6.0 albumin 3.5; tsh 0.157 vitamin B12; 856;  04-02-22: free t4: 0.80; RPR negative 04-05-22: wbc 6.6; hgb 12.8; hct 39.1; mcv 86.3 plt  196; glucose 140; bun 28; creat 1.14; k+ 4.2; na++ 142; ca 9.9; gfr >60; protein 6.4; albumin 3.7 05-29-22: wb 6.7; hgb 13.8; hct 42.8; mcv 85.4 plt 205; glucose 142; bun 18; creat 1.09; k+ 3.5; na++ 141; ca 9.5; gfr >60   06-24-22: hgb 13.0; hct 40.4; tsh 1.153 free t4: 0.95  12-03-22: uric acid: 5.2 12-07-22: wbc 7.3; hgb 13.8; hct 41.5; mcv 87.4 plt 210; glucose 133; bun 24; creat 0.88; k+ 34; na++ 139; ca 9.4 gfr >60 protein 6.4 albumin 3.4  01-21-23: wbc 9.9; hgb 13.8; hct 43.7; mcv 89.2 plt 266; glucose 125; bun 26; creat 0.91; k+ 3.2; na++ 140; ca 9.5; gfr >60; d-dimer 0.48  NO NEW LABS.    Review of Systems  Unable to perform ROS: Dementia    Physical Exam Constitutional:      General: He is not in acute distress.    Appearance: He is well-developed. He is not diaphoretic.  Neck:     Thyroid: No thyromegaly.  Cardiovascular:     Rate and Rhythm: Normal rate and regular rhythm.     Pulses: Normal pulses.     Heart sounds: Normal heart sounds.  Pulmonary:     Effort: Pulmonary effort is normal. No respiratory distress.     Breath sounds: Normal breath sounds.  Abdominal:     General: Bowel sounds are normal. There is no distension.     Palpations: Abdomen is soft.     Tenderness: There is no abdominal tenderness.  Musculoskeletal:        General: Normal range of motion.     Cervical back: Neck supple.     Right lower leg: No edema.     Left lower leg: No edema.  Lymphadenopathy:     Cervical: No cervical adenopathy.  Skin:    General: Skin is warm and dry.  Neurological:     Mental Status: He is alert. Mental status is at baseline.  Comments: 07-11-22: SLUMS 3/30   Psychiatric:        Mood and Affect: Mood normal.       ASSESSMENT/ PLAN:  TODAY  Vascular dementia with behavioral disturbance Agitation due to dementia   Will change his seroquel to 12.5 mg twice daily and 25 mg nightly  Will stop prilosec Will begin pepcid 20 mg nightly    Synthia Innocent  NP [facility] Adult Medicine  call [phone]

## 2023-03-12 DIAGNOSIS — F03911 Unspecified dementia, unspecified severity, with agitation: Secondary | ICD-10-CM | POA: Insufficient documentation

## 2023-03-28 ENCOUNTER — Encounter: Payer: Self-pay | Admitting: Adult Health

## 2023-03-28 ENCOUNTER — Non-Acute Institutional Stay (SKILLED_NURSING_FACILITY): Payer: Medicare Other | Admitting: Adult Health

## 2023-03-28 DIAGNOSIS — F01518 Vascular dementia, unspecified severity, with other behavioral disturbance: Secondary | ICD-10-CM

## 2023-03-28 DIAGNOSIS — F339 Major depressive disorder, recurrent, unspecified: Secondary | ICD-10-CM

## 2023-03-28 DIAGNOSIS — I700 Atherosclerosis of aorta: Secondary | ICD-10-CM

## 2023-03-28 NOTE — Progress Notes (Signed)
Location:  [facility] Room Number: 156D Place of Service:  SNF (31)   CODE STATUS: DNR  Allergies  Allergen Reactions   Hctz [Hydrochlorothiazide]     hypokalemia   Xanax [Alprazolam] Other (See Comments)    Causes hallucinations     Chief Complaint  Patient presents with   Acute Visit    care plan meeting    HPI:  We have come together for his care plan meeting. BIMS 6/15 mood 9/30: not sleeping well; nervous, depression. He is nonambulatory has had several falls without injury. He is dependent assist with his adls. He is incontinent of bladder and frequently incontinent of bowel. Dietary: D3 diet, setup for meals, appetite 1-100% weight is 149 pounds. Activities: does participate. He will continue to be followed for his chronic illnesses including:   Aortic atherosclerosis  Vascular dementia with behavioral disturbance   Major depression recurrent chronic   Past Medical History:  Diagnosis Date   Anxiety    Biliary dyskinesia    Cancer [facility])    prostate   Chronic chest wall pain    Closed displaced fracture of right femoral neck (HCC) 03/09/2020   9/30-10/10/2019 minimally displaced right femoral neck fracture sustained in a mechanical fall  Cannulated right hip pinning by Dr. Fuller Canada 10/1  Full dose aspirin prophylaxis through 11/5 with subsequent dosage of 81 mg daily.   Epididymitis 11/2021   Bilateral [facility]   Essential hypertension, benign 06/20/2016   Failure to thrive in adult 08/14/2022   GAD (generalized anxiety disorder)    GERD (gastroesophageal reflux disease)    H/O echocardiogram 2016   normal   Hallucination 08/31/2021   Heart murmur    "leaking heart valve"   High cholesterol 06/20/2016   HOH (hard of hearing)    left   Hx of falling    Hyperthyroiditis 04/04/2022   Inguinal hernia 01/15/2022   12/17/2021 Urology PA documented small right inguinal hernia which was easily reducible.  General surgery  assessment scheduled for 01/29/2022.  There is a discrepancy between the severity of his complaints related to this and the physical findings.  He would be a high risk surgical candidate because of advanced age and other comorbidities.   Interstitial lung disease (HCC) 03/15/2020   03/12/2020 chronic interstitial changes stable compared to 2019 suggesting chronic interstitial lung disease.    Patient and family deny knowledge of this diagnosis; he has never been on maintenance oxygen.  Patient has never smoked.  He worked for over 2 decades at a [employer] and on one occasion was exposed to hydrochloric acid fumes.   Interstitial pulmonary disease (HCC)    Iron deficiency anemia due to dietary causes 03/22/2020   Low serum thyroid stimulating hormone (TSH) 08/17/2021   Lower GI bleed    MDD (major depressive disorder)    Murmur, cardiac    Poor historian    Prostate cancer (HCC)    PUD (peptic ulcer disease)    Right inguinal pain 01/29/2022   RLS (restless legs syndrome)    SARS-CoV-2 positive 09/13/2021   Shortness of breath    Vascular dementia without behavioral disturbance (HCC) 07/05/2022   Vitamin B 12 deficiency 09/03/2021   Wrist fracture 12/16/2011    Past Surgical History:  Procedure Laterality Date   CHOLECYSTECTOMY N/A 06/28/2020   Procedure: LAPAROSCOPIC CHOLECYSTECTOMY;  Surgeon: Lucretia Roers, MD;  Location: [facility] ORS;  Service: General;  Laterality: N/A;  pt in [facility]  COLONOSCOPY  2005   Rehman: Sigmoid colon diverticulosis, moderate sized external hemorrhoids. A few focal areas of mucosal erythema felt to be nonspecific.   ESOPHAGOGASTRODUODENOSCOPY     ESOPHAGOGASTRODUODENOSCOPY N/A 07/18/2016   Rehman: normal exam except duodenal scar. h.pylori serologies negative   FLEXIBLE SIGMOIDOSCOPY N/A 06/11/2020   Procedure: FLEXIBLE SIGMOIDOSCOPY;  Surgeon: Lanelle Bal, DO;  Location: [facility] ENDO SUITE;  Service: Endoscopy;  Laterality: N/A;   HARDWARE  REMOVAL Right 03/20/2021   Procedure: HARDWARE REMOVAL right hip;  Surgeon: Vickki Hearing, MD;  Location: [facility] ORS;  Service: Orthopedics;  Laterality: Right;   HIP PINNING,CANNULATED Right 03/10/2020   Procedure: INTERNAL FIXATION RIGHT HIP;  Surgeon: Vickki Hearing, MD;  Location: [facility];  Service: Orthopedics;  Laterality: Right;   ORIF WRIST FRACTURE  12/19/2011   Procedure: OPEN REDUCTION INTERNAL FIXATION (ORIF) WRIST FRACTURE;  Surgeon: Vickki Hearing, MD;  Location: [facility] ORS;  Service: Orthopedics;  Laterality: Right;   prostate cancer     Diagnosed in the 90s   TONSILLECTOMY      Social History   Socioeconomic History   Marital status: Widowed    Spouse name: Not on file   Number of children: Not on file   Years of education: Not on file   Highest education level: Not on file  Occupational History   Occupation: retired    Associate Professor: RETIRED  Tobacco Use   Smoking status: Never   Smokeless tobacco: Never  Vaping Use   Vaping status: Never Used  Substance and Sexual Activity   Alcohol use: No   Drug use: No   Sexual activity: Not Currently  Other Topics Concern   Not on file  Social History Narrative   Is long term resident of [facility]    Social Determinants of Health   Financial Resource Strain: Not on file  Food Insecurity: Not on file  Transportation Needs: Not on file  Physical Activity: Not on file  Stress: Not on file  Social Connections: Not on file  Intimate Partner Violence: Not on file   Family History  Problem Relation Age of Onset   Cancer Other    Heart attack Mother    Cancer Brother    Heart attack Brother    Heart disease Sister        by pass      VITAL SIGNS BP (!) 145/72   Pulse 75   Temp 98.1 F (36.7 C) (Temporal)   Resp 16   Ht 5\' 9"  (1.753 m)   Wt 149 lb (67.6 kg)   SpO2 97%   BMI 22.00 kg/m   Outpatient Encounter Medications as of 03/28/2023  Medication Sig   acetaminophen (TYLENOL) 500 MG tablet Take 1,000 mg  by mouth 3 (three) times daily. Max 3000mg  in 24 hour period   [employer] (VENELEX) OINT Apply topically. Apply to sacrum, coccyx and bilateral buttocks qshift.   busPIRone (BUSPAR) 10 MG tablet Take 10 mg by mouth 3 (three) times daily.   cloNIDine (CATAPRES) 0.1 MG tablet Take 0.1 mg by mouth daily.   dilTIAZem HCl ER 300 MG TB24 Take 1 capsule by mouth daily.   famotidine (PEPCID) 40 MG tablet Take 40 mg by mouth at bedtime.   hydrALAZINE (APRESOLINE) 25 MG tablet Take 25 mg by mouth 3 (three) times daily.   isosorbide dinitrate (ISORDIL) 30 MG tablet Take 30 mg by mouth 2 (two) times daily.   lisinopril (ZESTRIL) 40 MG tablet Take 40  mg by mouth daily. Hypertensive chronic kidney disease with stage 1 through stage 4 chronic kidney disease, or unspecified chronic kidney disease   metoprolol tartrate (LOPRESSOR) 50 MG tablet Take 50 mg by mouth 2 (two) times daily.   Nutritional Supplements (ENSURE ENLIVE PO) Take 1 Can by mouth 2 (two) times daily.   polyethylene glycol (MIRALAX / GLYCOLAX) 17 g packet Take 17 g by mouth 2 (two) times daily.   potassium chloride (MICRO-K) 10 MEQ CR capsule Take 40 mEq by mouth daily.   QUEtiapine (SEROQUEL) 25 MG tablet Take 12.5 mg by mouth 2 (two) times daily. 6 AM and 2 PM   saline (AYR) GEL Place 1 Application into both nostrils every 6 (six) hours as needed.   UNABLE TO FIND Diet: DYS 3/Thin Liquids   venlafaxine (EFFEXOR) 75 MG tablet Take 75 mg by mouth 2 (two) times daily with a meal.   No facility-administered encounter medications on file as of 03/28/2023.     SIGNIFICANT DIAGNOSTIC EXAMS  PREVIOUS   01-23-22: dexa: t score -1.331  NO NEW EXAMS    LABS REVIEWED PREVIOUS      01-31-22: glucose 100; bun 20; creat 0.94; k+ 3.5; na++ 143; ca 9.5 gfr >60 02-11-22: glucose 102; bun 19; creat 0.91; k+ 3.3; na++ 141; ca 9.3; gfr >60 02-18-22: glucose 114; bun 27; creat 1.10; k+ 3.8; na++ 141; ca 9.3; gfr >60 04-01-22; wbc 6.9; hgb  13.3; hct 41.0; mcv 86.9 plt 174; glucose 115; bun 37; creat 1.07; k+ 4.1; na++ 144; ca 9.7; gfr >60; protein 6.0 albumin 3.5; tsh 0.157 vitamin B12; 856;  04-02-22: free t4: 0.80; RPR negative 04-05-22: wbc 6.6; hgb 12.8; hct 39.1; mcv 86.3 plt 196; glucose 140; bun 28; creat 1.14; k+ 4.2; na++ 142; ca 9.9; gfr >60; protein 6.4; albumin 3.7 05-29-22: wb 6.7; hgb 13.8; hct 42.8; mcv 85.4 plt 205; glucose 142; bun 18; creat 1.09; k+ 3.5; na++ 141; ca 9.5; gfr >60   06-24-22: hgb 13.0; hct 40.4; tsh 1.153 free t4: 0.95  12-03-22: uric acid: 5.2 12-07-22: wbc 7.3; hgb 13.8; hct 41.5; mcv 87.4 plt 210; glucose 133; bun 24; creat 0.88; k+ 34; na++ 139; ca 9.4 gfr >60 protein 6.4 albumin 3.4  01-21-23: wbc 9.9; hgb 13.8; hct 43.7; mcv 89.2 plt 266; glucose 125; bun 26; creat 0.91; k+ 3.2; na++ 140; ca 9.5; gfr >60; d-dimer 0.48  NO NEW LABS.     Review of Systems  Unable to perform ROS: Dementia   Physical Exam Constitutional:      General: He is not in acute distress.    Appearance: He is well-developed. He is not diaphoretic.  Neck:     Thyroid: No thyromegaly.  Cardiovascular:     Rate and Rhythm: Normal rate and regular rhythm.     Pulses: Normal pulses.     Heart sounds: Normal heart sounds.  Pulmonary:     Effort: Pulmonary effort is normal. No respiratory distress.     Breath sounds: Normal breath sounds.  Abdominal:     General: Bowel sounds are normal. There is no distension.     Palpations: Abdomen is soft.     Tenderness: There is no abdominal tenderness.  Musculoskeletal:        General: Normal range of motion.     Cervical back: Neck supple.     Right lower leg: No edema.     Left lower leg: No edema.  Lymphadenopathy:     Cervical: No  cervical adenopathy.  Skin:    General: Skin is warm and dry.  Neurological:     Mental Status: He is alert. Mental status is at baseline.     Comments: 07-11-22: SLUMS 3/30        Psychiatric:        Mood and Affect: Mood normal.        ASSESSMENT/ PLAN:  TODAY  Aortic atherosclerosis Vascular dementia with behavioral disturbance Major depression recurrent chronic    Will continue current medications Will continue current plan of care Will continue to monitor his status   Time spent with patient: 40 minutes: medications; dietary; activities.    Synthia Innocent NP [facility]   call [phone]

## 2023-04-01 ENCOUNTER — Non-Acute Institutional Stay (SKILLED_NURSING_FACILITY): Payer: Medicare Other | Admitting: Adult Health

## 2023-04-01 ENCOUNTER — Encounter: Payer: Self-pay | Admitting: Adult Health

## 2023-04-01 DIAGNOSIS — G2581 Restless legs syndrome: Secondary | ICD-10-CM

## 2023-04-01 DIAGNOSIS — N182 Chronic kidney disease, stage 2 (mild): Secondary | ICD-10-CM | POA: Diagnosis not present

## 2023-04-01 DIAGNOSIS — I129 Hypertensive chronic kidney disease with stage 1 through stage 4 chronic kidney disease, or unspecified chronic kidney disease: Secondary | ICD-10-CM | POA: Diagnosis not present

## 2023-04-01 DIAGNOSIS — F339 Major depressive disorder, recurrent, unspecified: Secondary | ICD-10-CM

## 2023-04-01 NOTE — Progress Notes (Unsigned)
Location:  [facility] Room Number: 156 Place of Service:  SNF (31)   CODE STATUS: dnr   Allergies  Allergen Reactions   Hctz [Hydrochlorothiazide]     hypokalemia   Xanax [Alprazolam] Other (See Comments)    Causes hallucinations     Chief Complaint  Patient presents with   Medical Management of Chronic Issues          Benign hypertension with chronic kidney disease stage III:    Major depression recurrent chronic;    RLS:     HPI:  He is a 89 year old long term resident of this facility being seen for the management of his chronic illnesses: Benign hypertension with chronic kidney disease stage III:    Major depression recurrent chronic;    RLS. There are no reports of uncontrolled pain. His falls have slowed down. He is slowly losing weight; which is an expected outcome in the end stages of dementia.   Past Medical History:  Diagnosis Date   Anxiety    Biliary dyskinesia    Cancer [facility])    prostate   Chronic chest wall pain    Closed displaced fracture of right femoral neck (HCC) 03/09/2020   9/30-10/10/2019 minimally displaced right femoral neck fracture sustained in a mechanical fall  Cannulated right hip pinning by Dr. Fuller Canada 10/1  Full dose aspirin prophylaxis through 11/5 with subsequent dosage of 81 mg daily.   Epididymitis 11/2021   Bilateral [facility]   Essential hypertension, benign 06/20/2016   Failure to thrive in adult 08/14/2022   GAD (generalized anxiety disorder)    GERD (gastroesophageal reflux disease)    H/O echocardiogram 2016   normal   Hallucination 08/31/2021   Heart murmur    "leaking heart valve"   High cholesterol 06/20/2016   HOH (hard of hearing)    left   Hx of falling    Hyperthyroiditis 04/04/2022   Inguinal hernia 01/15/2022   12/17/2021 Urology PA documented small right inguinal hernia which was easily reducible.  General surgery assessment scheduled for 01/29/2022.  There is a discrepancy  between the severity of his complaints related to this and the physical findings.  He would be a high risk surgical candidate because of advanced age and other comorbidities.   Interstitial lung disease (HCC) 03/15/2020   03/12/2020 chronic interstitial changes stable compared to 2019 suggesting chronic interstitial lung disease.    Patient and family deny knowledge of this diagnosis; he has never been on maintenance oxygen.  Patient has never smoked.  He worked for over 2 decades at a [employer] and on one occasion was exposed to hydrochloric acid fumes.   Interstitial pulmonary disease (HCC)    Iron deficiency anemia due to dietary causes 03/22/2020   Low serum thyroid stimulating hormone (TSH) 08/17/2021   Lower GI bleed    MDD (major depressive disorder)    Murmur, cardiac    Poor historian    Prostate cancer (HCC)    PUD (peptic ulcer disease)    Right inguinal pain 01/29/2022   RLS (restless legs syndrome)    SARS-CoV-2 positive 09/13/2021   Shortness of breath    Vascular dementia without behavioral disturbance (HCC) 07/05/2022   Vitamin B 12 deficiency 09/03/2021   Wrist fracture 12/16/2011    Past Surgical History:  Procedure Laterality Date   CHOLECYSTECTOMY N/A 06/28/2020   Procedure: LAPAROSCOPIC CHOLECYSTECTOMY;  Surgeon: Lucretia Roers, MD;  Location: [facility] ORS;  Service: General;  Laterality: N/A;  pt in [facility]   COLONOSCOPY  2005   Rehman: Sigmoid colon diverticulosis, moderate sized external hemorrhoids. A few focal areas of mucosal erythema felt to be nonspecific.   ESOPHAGOGASTRODUODENOSCOPY     ESOPHAGOGASTRODUODENOSCOPY N/A 07/18/2016   Rehman: normal exam except duodenal scar. h.pylori serologies negative   FLEXIBLE SIGMOIDOSCOPY N/A 06/11/2020   Procedure: FLEXIBLE SIGMOIDOSCOPY;  Surgeon: Lanelle Bal, DO;  Location: [facility];  Service: Endoscopy;  Laterality: N/A;   HARDWARE REMOVAL Right 03/20/2021   Procedure: HARDWARE REMOVAL  right hip;  Surgeon: Vickki Hearing, MD;  Location: [facility];  Service: Orthopedics;  Laterality: Right;   HIP PINNING,CANNULATED Right 03/10/2020   Procedure: INTERNAL FIXATION RIGHT HIP;  Surgeon: Vickki Hearing, MD;  Location: [facility];  Service: Orthopedics;  Laterality: Right;   ORIF WRIST FRACTURE  12/19/2011   Procedure: OPEN REDUCTION INTERNAL FIXATION (ORIF) WRIST FRACTURE;  Surgeon: Vickki Hearing, MD;  Location: [facility];  Service: Orthopedics;  Laterality: Right;   prostate cancer     Diagnosed in the 90s   TONSILLECTOMY      Social History   Socioeconomic History   Marital status: Widowed    Spouse name: Not on file   Number of children: Not on file   Years of education: Not on file   Highest education level: Not on file  Occupational History   Occupation: retired    Associate Professor: RETIRED  Tobacco Use   Smoking status: Never   Smokeless tobacco: Never  Vaping Use   Vaping status: Never Used  Substance and Sexual Activity   Alcohol use: No   Drug use: No   Sexual activity: Not Currently  Other Topics Concern   Not on file  Social History Narrative   Is long term resident of [facility]    Social Determinants of Health   Financial Resource Strain: Not on file  Food Insecurity: Not on file  Transportation Needs: Not on file  Physical Activity: Not on file  Stress: Not on file  Social Connections: Not on file  Intimate Partner Violence: Not on file   Family History  Problem Relation Age of Onset   Cancer Other    Heart attack Mother    Cancer Brother    Heart attack Brother    Heart disease Sister        by pass      VITAL SIGNS BP (!) 170/77   Pulse 74   Temp 98.5 F (36.9 C)   Resp 18   Ht 5\' 9"  (1.753 m)   Wt 149 lb (67.6 kg)   SpO2 96%   BMI 22.00 kg/m   Outpatient Encounter Medications as of 04/01/2023  Medication Sig   acetaminophen (TYLENOL) 500 MG tablet Take 1,000 mg by mouth 3 (three) times daily. Max 3000mg  in 24 hour period    [employer] (VENELEX) OINT Apply topically. Apply to sacrum, coccyx and bilateral buttocks qshift.   busPIRone (BUSPAR) 10 MG tablet Take 10 mg by mouth 3 (three) times daily.   cloNIDine (CATAPRES) 0.1 MG tablet Take 0.1 mg by mouth daily.   dilTIAZem HCl ER 300 MG TB24 Take 1 capsule by mouth daily.   famotidine (PEPCID) 40 MG tablet Take 40 mg by mouth at bedtime.   hydrALAZINE (APRESOLINE) 25 MG tablet Take 25 mg by mouth 3 (three) times daily.   isosorbide dinitrate (ISORDIL) 30 MG tablet Take 30 mg by mouth 2 (two)  times daily.   lisinopril (ZESTRIL) 40 MG tablet Take 40 mg by mouth daily. Hypertensive chronic kidney disease with stage 1 through stage 4 chronic kidney disease, or unspecified chronic kidney disease   metoprolol tartrate (LOPRESSOR) 50 MG tablet Take 50 mg by mouth 2 (two) times daily.   Nutritional Supplements (ENSURE ENLIVE PO) Take 1 Can by mouth 2 (two) times daily.   polyethylene glycol (MIRALAX / GLYCOLAX) 17 g packet Take 17 g by mouth 2 (two) times daily.   potassium chloride (MICRO-K) 10 MEQ CR capsule Take 40 mEq by mouth daily.   QUEtiapine (SEROQUEL) 25 MG tablet Take 12.5 mg by mouth 2 (two) times daily. 6 AM and 2 PM and 25 mg nightly    saline (AYR) GEL Place 1 Application into both nostrils every 6 (six) hours as needed.   UNABLE TO FIND Diet: DYS 3/Thin Liquids   venlafaxine (EFFEXOR) 75 MG tablet Take 75 mg by mouth 2 (two) times daily with a meal.   No facility-administered encounter medications on file as of 04/01/2023.     SIGNIFICANT DIAGNOSTIC EXAMS  PREVIOUS   01-23-22: dexa: t score -1.331  NO NEW EXAMS    LABS REVIEWED PREVIOUS      04-01-22; wbc 6.9; hgb 13.3; hct 41.0; mcv 86.9 plt 174; glucose 115; bun 37; creat 1.07; k+ 4.1; na++ 144; ca 9.7; gfr >60; protein 6.0 albumin 3.5; tsh 0.157 vitamin B12; 856;  04-02-22: free t4: 0.80; RPR negative 04-05-22: wbc 6.6; hgb 12.8; hct 39.1; mcv 86.3 plt 196; glucose 140; bun 28;  creat 1.14; k+ 4.2; na++ 142; ca 9.9; gfr >60; protein 6.4; albumin 3.7 05-29-22: wb 6.7; hgb 13.8; hct 42.8; mcv 85.4 plt 205; glucose 142; bun 18; creat 1.09; k+ 3.5; na++ 141; ca 9.5; gfr >60   06-24-22: hgb 13.0; hct 40.4; tsh 1.153 free t4: 0.95  12-03-22: uric acid: 5.2 12-07-22: wbc 7.3; hgb 13.8; hct 41.5; mcv 87.4 plt 210; glucose 133; bun 24; creat 0.88; k+ 34; na++ 139; ca 9.4 gfr >60 protein 6.4 albumin 3.4  01-21-23: wbc 9.9; hgb 13.8; hct 43.7; mcv 89.2 plt 266; glucose 125; bun 26; creat 0.91; k+ 3.2; na++ 140; ca 9.5; gfr >60; d-dimer 0.48  NO NEW LABS.    Review of Systems  Unable to perform ROS: Dementia   Physical Exam Constitutional:      General: He is not in acute distress.    Appearance: He is well-developed. He is not diaphoretic.  Neck:     Thyroid: No thyromegaly.  Cardiovascular:     Rate and Rhythm: Normal rate and regular rhythm.     Pulses: Normal pulses.     Heart sounds: Normal heart sounds.  Pulmonary:     Effort: Pulmonary effort is normal. No respiratory distress.     Breath sounds: Normal breath sounds.  Abdominal:     General: Bowel sounds are normal. There is no distension.     Palpations: Abdomen is soft.     Tenderness: There is no abdominal tenderness.  Musculoskeletal:        General: Normal range of motion.     Cervical back: Neck supple.     Right lower leg: No edema.     Left lower leg: No edema.  Lymphadenopathy:     Cervical: No cervical adenopathy.  Skin:    General: Skin is warm and dry.  Neurological:     Mental Status: He is alert. Mental status is at baseline.  Comments: 07-11-22: SLUMS 3/30       Psychiatric:        Mood and Affect: Mood normal.     ASSESSMENT/ PLAN:  TODAY  Benign hypertension with chronic kidney disease stage III: b/p 177/70 will continue lopressor 50 mg twice daily; cardizem cr 300 mg daily; lisinopril 40 mg daily; hydralazine 25 mg three times daily; isordil 30 mg twice daily and will increase  clonidine to 0.1 mg twice daily   2. Major depression recurrent chronic; will continue effexor 75 mg twice daily and will continue seroquel 12.5 mg twice daily and 25 mg nightly will monitor   3. RLS: is now off requip   PREVIOUS     4. Iron deficiency anemia due to dietary intake hgb 13.8 is off iron  5.  Interstitial lung disease: will monitor   6. Vitamin B 12 deficiency: will monitor  7 Osteopenia: t score -1.331  8. Left hip pain: will continue tylenol 1 gm three times daily   9. Dementia without behavioral disturbance; unspecified dementia type/failure to thrive in adult: weight is 149 pounds.   10. Aortic atherosclerosis (ct 06-09-20) no statin due to advanced age  43. Protein calorie malnutrition: albumin 3.7 will monitor   12. Gastroesophageal reflux disease without esophagitis: will continue prilosec 20 mg daily   13. Prostate cancer/over active bladder: is not on medications due to history of falls; unable to take myrbetriq due to hypertension  14. Hypokalemia: k+ 3.5 will continue k+ 40 meq daily   15. Chronic constipation: will continue miralax twice daily   16. CKD stage 3b bun 26 creat 0.91 gfr >60     Synthia Innocent NP [facility]  call [phone]

## 2023-05-05 ENCOUNTER — Non-Acute Institutional Stay (SKILLED_NURSING_FACILITY): Payer: Medicare Other | Admitting: Adult Health

## 2023-05-05 ENCOUNTER — Encounter: Payer: Self-pay | Admitting: Adult Health

## 2023-05-05 DIAGNOSIS — D508 Other iron deficiency anemias: Secondary | ICD-10-CM

## 2023-05-05 DIAGNOSIS — E538 Deficiency of other specified B group vitamins: Secondary | ICD-10-CM

## 2023-05-05 DIAGNOSIS — J849 Interstitial pulmonary disease, unspecified: Secondary | ICD-10-CM | POA: Diagnosis not present

## 2023-05-05 NOTE — Progress Notes (Unsigned)
Location:  [facility] Room Number: [id number] Place of Service:  SNF (31)   CODE STATUS: dnr   Allergies  Allergen Reactions   Hctz [Hydrochlorothiazide]     hypokalemia   Xanax [Alprazolam] Other (See Comments)    Causes hallucinations     Chief Complaint  Patient presents with   Medical Management of Chronic Issues        Iron deficiency anemia due to dietary intake:    Interstitial lung disease:   Vitamin B12 deficiency     HPI:  Jeffrey Sherman is a 89 year old long term resident of this facility being seen for the management of his chronic illnesses:   Iron deficiency anemia due to dietary intake:    Interstitial lung disease:   Vitamin B12 deficiency.  There are no reports of uncontrolled pain. His blood pressure readings are elevated but reasonably managed.   Past Medical History:  Diagnosis Date   Anxiety    Biliary dyskinesia    Cancer [facility])    prostate   Chronic chest wall pain    Closed displaced fracture of right femoral neck (HCC) 03/09/2020   9/30-10/10/2019 minimally displaced right femoral neck fracture sustained in a mechanical fall  Cannulated right hip pinning by Dr. Fuller Canada 10/1  Full dose aspirin prophylaxis through 11/5 with subsequent dosage of 81 mg daily.   Epididymitis 11/2021   Bilateral [facility]   Essential hypertension, benign 06/20/2016   Failure to thrive in adult 08/14/2022   GAD (generalized anxiety disorder)    GERD (gastroesophageal reflux disease)    H/O echocardiogram 2016   normal   Hallucination 08/31/2021   Heart murmur    "leaking heart valve"   High cholesterol 06/20/2016   HOH (hard of hearing)    left   Hx of falling    Hyperthyroiditis 04/04/2022   Inguinal hernia 01/15/2022   12/17/2021 Urology PA documented small right inguinal hernia which was easily reducible.  General surgery assessment scheduled for 01/29/2022.  There is a discrepancy between the severity of his complaints related to this and  the physical findings.  Jeffrey Sherman would be a high risk surgical candidate because of advanced age and other comorbidities.   Interstitial lung disease (HCC) 03/15/2020   03/12/2020 chronic interstitial changes stable compared to 2019 suggesting chronic interstitial lung disease.    Patient and family deny knowledge of this diagnosis; Jeffrey Sherman has never been on maintenance oxygen.  Patient has never smoked.  Jeffrey Sherman worked for over 2 decades at a [employer] and on one occasion was exposed to hydrochloric acid fumes.   Interstitial pulmonary disease (HCC)    Iron deficiency anemia due to dietary causes 03/22/2020   Low serum thyroid stimulating hormone (TSH) 08/17/2021   Lower GI bleed    MDD (major depressive disorder)    Murmur, cardiac    Poor historian    Prostate cancer (HCC)    PUD (peptic ulcer disease)    Right inguinal pain 01/29/2022   RLS (restless legs syndrome)    SARS-CoV-2 positive 09/13/2021   Shortness of breath    Vascular dementia without behavioral disturbance (HCC) 07/05/2022   Vitamin B 12 deficiency 09/03/2021   Wrist fracture 12/16/2011    Past Surgical History:  Procedure Laterality Date   CHOLECYSTECTOMY N/A 06/28/2020   Procedure: LAPAROSCOPIC CHOLECYSTECTOMY;  Surgeon: Lucretia Roers, MD;  Location: [facility] ORS;  Service: General;  Laterality: N/A;  pt in [facility]   COLONOSCOPY  2005   Rehman: Sigmoid colon diverticulosis, moderate sized external hemorrhoids. A few focal areas of mucosal erythema felt to be nonspecific.   ESOPHAGOGASTRODUODENOSCOPY     ESOPHAGOGASTRODUODENOSCOPY N/A 07/18/2016   Rehman: normal exam except duodenal scar. h.pylori serologies negative   FLEXIBLE SIGMOIDOSCOPY N/A 06/11/2020   Procedure: FLEXIBLE SIGMOIDOSCOPY;  Surgeon: Lanelle Bal, DO;  Location: [facility] ENDO SUITE;  Service: Endoscopy;  Laterality: N/A;   HARDWARE REMOVAL Right 03/20/2021   Procedure: HARDWARE REMOVAL right hip;  Surgeon: Vickki Hearing, MD;  Location: [facility]  ORS;  Service: Orthopedics;  Laterality: Right;   HIP PINNING,CANNULATED Right 03/10/2020   Procedure: INTERNAL FIXATION RIGHT HIP;  Surgeon: Vickki Hearing, MD;  Location: [facility] ORS;  Service: Orthopedics;  Laterality: Right;   ORIF WRIST FRACTURE  12/19/2011   Procedure: OPEN REDUCTION INTERNAL FIXATION (ORIF) WRIST FRACTURE;  Surgeon: Vickki Hearing, MD;  Location: [facility] ORS;  Service: Orthopedics;  Laterality: Right;   prostate cancer     Diagnosed in the 90s   TONSILLECTOMY      Social History   Socioeconomic History   Marital status: Widowed    Spouse name: Not on file   Number of children: Not on file   Years of education: Not on file   Highest education level: Not on file  Occupational History   Occupation: retired    Associate Professor: RETIRED  Tobacco Use   Smoking status: Never   Smokeless tobacco: Never  Vaping Use   Vaping status: Never Used  Substance and Sexual Activity   Alcohol use: No   Drug use: No   Sexual activity: Not Currently  Other Topics Concern   Not on file  Social History Narrative   Is long term resident of [facility]    Social Determinants of Health   Financial Resource Strain: Not on file  Food Insecurity: Not on file  Transportation Needs: Not on file  Physical Activity: Not on file  Stress: Not on file  Social Connections: Not on file  Intimate Partner Violence: Not on file   Family History  Problem Relation Age of Onset   Cancer Other    Heart attack Mother    Cancer Brother    Heart attack Brother    Heart disease Sister        by pass      VITAL SIGNS BP (!) 141/79   Pulse 82   Temp (!) 97.5 F (36.4 C)   Ht 5\' 9"  (1.753 m)   Wt 154 lb (69.9 kg)   BMI 22.74 kg/m   Outpatient Encounter Medications as of 05/05/2023  Medication Sig   acetaminophen (TYLENOL) 500 MG tablet Take 1,000 mg by mouth 3 (three) times daily. Max 3000mg  in 24 hour period   [employer] (VENELEX) OINT Apply topically. Apply to sacrum, coccyx and  bilateral buttocks qshift.   busPIRone (BUSPAR) 10 MG tablet Take 10 mg by mouth 3 (three) times daily.   cloNIDine (CATAPRES) 0.1 MG tablet Take 0.1 mg by mouth 2 (two) times daily.   dilTIAZem HCl ER 300 MG TB24 Take 1 capsule by mouth daily.   famotidine (PEPCID) 40 MG tablet Take 40 mg by mouth at bedtime.   hydrALAZINE (APRESOLINE) 25 MG tablet Take 25 mg by mouth 3 (three) times daily.   isosorbide dinitrate (ISORDIL) 30 MG tablet Take 30 mg by mouth 2 (two) times daily.   lisinopril (ZESTRIL) 40 MG tablet Take 40 mg by mouth daily. Hypertensive chronic kidney  disease with stage 1 through stage 4 chronic kidney disease, or unspecified chronic kidney disease   metoprolol tartrate (LOPRESSOR) 50 MG tablet Take 50 mg by mouth 2 (two) times daily.   Nutritional Supplements (ENSURE ENLIVE PO) Take 1 Can by mouth 2 (two) times daily.   polyethylene glycol (MIRALAX / GLYCOLAX) 17 g packet Take 17 g by mouth 2 (two) times daily.   potassium chloride (MICRO-K) 10 MEQ CR capsule Take 40 mEq by mouth daily.   QUEtiapine (SEROQUEL) 25 MG tablet Take 12.5 mg by mouth 2 (two) times daily. 6 AM and 2 PM and 25 mg nightly   saline (AYR) GEL Place 1 Application into both nostrils every 6 (six) hours as needed.   UNABLE TO FIND Diet: DYS 3/Thin Liquids   venlafaxine (EFFEXOR) 75 MG tablet Take 75 mg by mouth 2 (two) times daily with a meal.   No facility-administered encounter medications on file as of 05/05/2023.     SIGNIFICANT DIAGNOSTIC EXAMS  PREVIOUS   01-23-22: dexa: t score -1.331  NO NEW EXAMS    LABS REVIEWED PREVIOUS      04-01-22; wbc 6.9; hgb 13.3; hct 41.0; mcv 86.9 plt 174; glucose 115; bun 37; creat 1.07; k+ 4.1; na++ 144; ca 9.7; gfr >60; protein 6.0 albumin 3.5; tsh 0.157 vitamin B12; 856;  04-02-22: free t4: 0.80; RPR negative 04-05-22: wbc 6.6; hgb 12.8; hct 39.1; mcv 86.3 plt 196; glucose 140; bun 28; creat 1.14; k+ 4.2; na++ 142; ca 9.9; gfr >60; protein 6.4; albumin  3.7 05-29-22: wb 6.7; hgb 13.8; hct 42.8; mcv 85.4 plt 205; glucose 142; bun 18; creat 1.09; k+ 3.5; na++ 141; ca 9.5; gfr >60   06-24-22: hgb 13.0; hct 40.4; tsh 1.153 free t4: 0.95  12-03-22: uric acid: 5.2 12-07-22: wbc 7.3; hgb 13.8; hct 41.5; mcv 87.4 plt 210; glucose 133; bun 24; creat 0.88; k+ 34; na++ 139; ca 9.4 gfr >60 protein 6.4 albumin 3.4  01-21-23: wbc 9.9; hgb 13.8; hct 43.7; mcv 89.2 plt 266; glucose 125; bun 26; creat 0.91; k+ 3.2; na++ 140; ca 9.5; gfr >60; d-dimer 0.48  TODAY  02-06-23: glucose 99; bun 52; creat 1.27; k+ 4.3; na++ 139; ca 9.1 gfr 53; protein 5.5 albumin 2.8; tsh 0.640  Review of Systems  Unable to perform ROS: Dementia   Physical Exam Constitutional:      General: Jeffrey Sherman is not in acute distress.    Appearance: Jeffrey Sherman is well-developed. Jeffrey Sherman is not diaphoretic.  Neck:     Thyroid: No thyromegaly.  Cardiovascular:     Rate and Rhythm: Normal rate and regular rhythm.     Pulses: Normal pulses.     Heart sounds: Normal heart sounds.  Pulmonary:     Effort: Pulmonary effort is normal. No respiratory distress.     Breath sounds: Normal breath sounds.  Abdominal:     General: Bowel sounds are normal. There is no distension.     Palpations: Abdomen is soft.     Tenderness: There is no abdominal tenderness.  Musculoskeletal:        General: Normal range of motion.     Cervical back: Neck supple.     Right lower leg: No edema.     Left lower leg: No edema.  Lymphadenopathy:     Cervical: No cervical adenopathy.  Skin:    General: Skin is warm and dry.  Neurological:     Mental Status: Jeffrey Sherman is alert. Mental status is at baseline.  Comments:  07-11-22: SLUMS 3/30        Psychiatric:        Mood and Affect: Mood normal.     ASSESSMENT/ PLAN:  TODAY  Iron deficiency anemia due to dietary intake: hgb 13.8 is off iron  2. Interstitial lung disease: will monitor  3. Vitamin B12 deficiency: will monitor   PREVIOUS     4 Osteopenia: t score -1.331  5.  Left hip pain: will continue tylenol 1 gm three times daily   6. Dementia without behavioral disturbance; unspecified dementia type/failure to thrive in adult: weight is 154 pounds.   7. Aortic atherosclerosis (ct 06-09-20) no statin due to advanced age  76. Protein calorie malnutrition: albumin 3.7 will monitor   9. Gastroesophageal reflux disease without esophagitis: will continue prilosec 20 mg daily   10. Prostate cancer/over active bladder: is not on medications due to history of falls; unable to take myrbetriq due to hypertension  11. Hypokalemia: k+ 3.5 will continue k+ 40 meq daily   12. Chronic constipation: will continue miralax twice daily   13. CKD stage 3b bun 26 creat 0.91 gfr >60   14. Benign hypertension with chronic kidney disease stage III: b/p 141/79 will continue lopressor 50 mg twice daily; cardizem cr 300 mg daily; lisinopril 40 mg daily; hydralazine 25 mg three times daily; isordil 30 mg twice daily and clonidine to 0.1 mg twice daily   15. Major depression recurrent chronic; will continue effexor 75 mg twice daily and will continue seroquel 12.5 mg twice daily and 25 mg nightly will monitor   16. RLS: is now off requip     Synthia Innocent NP [facility]   call [phone]

## 2023-05-06 DIAGNOSIS — D509 Iron deficiency anemia, unspecified: Secondary | ICD-10-CM | POA: Insufficient documentation

## 2023-05-12 ENCOUNTER — Non-Acute Institutional Stay (SKILLED_NURSING_FACILITY): Payer: Medicare Other | Admitting: Internal Medicine

## 2023-05-12 DIAGNOSIS — J849 Interstitial pulmonary disease, unspecified: Secondary | ICD-10-CM

## 2023-05-12 DIAGNOSIS — N1831 Chronic kidney disease, stage 3a: Secondary | ICD-10-CM

## 2023-05-12 DIAGNOSIS — F01518 Vascular dementia, unspecified severity, with other behavioral disturbance: Secondary | ICD-10-CM

## 2023-05-12 DIAGNOSIS — R627 Adult failure to thrive: Secondary | ICD-10-CM

## 2023-05-12 NOTE — Progress Notes (Unsigned)
NURSING HOME LOCATION:  [facility] ROOM NUMBER:  [id number]  CODE STATUS:  DNR  PCP:  Synthia Innocent NP  This is a nursing facility follow up visit of chronic medical diagnoses& to document compliance with Regulation 483.30 (c) in The Long Term Care Survey Manual Phase 2 which mandates caregiver visit ( visits can alternate among physician, PA or NP as per statutes) within 10 days of 30 days / 60 days/ 90 days post admission to SNF date    Interim medical record and care since last SNF visit was updated with review of diagnostic studies and change in clinical status since last visit were documented.  HPI: He is a permanent resident of this facility with medical diagnoses of dyslipidemia, history of prostate cancer, GERD, GAD, vascular dementia with behavioral issues, essential hypertension, and adult failure to thrive. Most current labs reveal CKD stage IIIa with a creatinine of 1.27 and GFR 53.  Protein/caloric malnutrition was documented by an albumin of 2.8 and total protein of 5.5.  Review of systems: Dementia invalidated responses.  He denies any active symptoms.  Responses were essentially monosyllabic.  He had difficulty following commands. Staff reports that he continues to exhibit behavioral issues with verbal and physical manifestations, more commonly in the afternoon suggesting sundowning.  Constitutional: No fever, significant weight change, fatigue  Eyes: No redness, discharge, pain, vision change ENT/mouth: No nasal congestion,  purulent discharge, earache, change in hearing, sore throat  Cardiovascular: No chest pain, palpitations, paroxysmal nocturnal dyspnea, claudication, edema  Respiratory: No cough, sputum production, hemoptysis, DOE, significant snoring, apnea   Gastrointestinal: No heartburn, dysphagia, abdominal pain, nausea /vomiting, rectal bleeding, melena, change in bowels Genitourinary: No dysuria, hematuria, pyuria, incontinence,  nocturia Musculoskeletal: No joint stiffness, joint swelling, weakness, pain Dermatologic: No rash, pruritus, change in appearance of skin Neurologic: No dizziness, headache, syncope, seizures, numbness, tingling Psychiatric: No significant anxiety, depression, insomnia, anorexia Endocrine: No change in hair/skin/nails, excessive thirst, excessive hunger, excessive urination  Hematologic/lymphatic: No significant bruising, lymphadenopathy, abnormal bleeding Allergy/immunology: No itchy/watery eyes, significant sneezing, urticaria, angioedema  Physical exam:  Pertinent or positive findings:  He appears malnourished & chronically ill.  He had difficulty following commands.  He has pattern alopecia.  He is Transport planner.  He is missing multiple teeth.  He has retained food from lunch.  The right nasolabial fold is decreased.  Heart sounds are markedly distant.  Breath sounds are decreased.  Pedal pulses are not palpable.  He has trace edema in the right lower extremity and 1/2+ left lower extremity.  There is limb atrophy and interosseous wasting.  There are extensive keratotic lesions of the upper extremities with marked dryness of the skin.  There is a large keratotic lesion over the left thumb.There is scattered irregular eschar in front of the left ear.  There is a crusted keratotic lesion over the superior aspect of the right ear. General appearance: Adequately nourished; no acute distress, increased work of breathing is present.   Lymphatic: No lymphadenopathy about the head, neck, axilla. Eyes: No conjunctival inflammation or lid edema is present. There is no scleral icterus. Ears:  External ear exam shows no significant lesions or deformities.   Nose:  External nasal examination shows no deformity or inflammation. Nasal mucosa are pink and moist without lesions, exudates Oral exam:  Lips and gums are healthy appearing. There is no oropharyngeal erythema or exudate. Neck:  No thyromegaly, masses,  tenderness noted.    Heart:  Normal rate and  regular rhythm. S1 and S2 normal without gallop, murmur, click, rub .  Lungs: Chest clear to auscultation without wheezes, rhonchi, rales, rubs. Abdomen: Bowel sounds are normal. Abdomen is soft and nontender with no organomegaly, hernias, masses. GU: Deferred  Extremities:  No cyanosis, clubbing, edema  Neurologic exam : Cn 2-7 intact Strength equal  in upper & lower extremities Balance, Rhomberg, finger to nose testing could not be completed due to clinical state Deep tendon reflexes are equal Skin: Warm & dry w/o tenting. No significant lesions or rash.  See summary under each active problem in the Problem List with associated updated therapeutic plan

## 2023-05-13 ENCOUNTER — Encounter: Payer: Self-pay | Admitting: Internal Medicine

## 2023-05-13 DIAGNOSIS — N183 Chronic kidney disease, stage 3 unspecified: Secondary | ICD-10-CM | POA: Insufficient documentation

## 2023-05-13 NOTE — Assessment & Plan Note (Signed)
Current albumin is 2.8 and total protein 5.5 documenting protein/caloric malnutrition.  He also has limb atrophy and interosseous wasting.  His advanced dementia prevents him from adhering to nutritional recommendations.

## 2023-05-13 NOTE — Patient Instructions (Signed)
See assessment and plan under each diagnosis in the problem list and acutely for this visit 

## 2023-05-13 NOTE — Assessment & Plan Note (Signed)
Staff reports that he can be physically and verbally abusive.  These behaviors are typically more common in the afternoon suggesting sundowning.  Staff tries to keep him in his wheelchair around the nurses station to prevent him from trying to ambulate resulting in recurrent falls.

## 2023-05-13 NOTE — Assessment & Plan Note (Signed)
Current creatinine is 1.27 and GFR 53 confirming CKD stage IIIa.  Med list reviewed; no indication for change in meds or dosages.

## 2023-05-14 NOTE — Assessment & Plan Note (Signed)
No pulmonary symptoms & good O2 sats on RA despite PMH of IPF. Continue to monitor.

## 2023-06-06 ENCOUNTER — Other Ambulatory Visit (HOSPITAL_COMMUNITY)
Admission: RE | Admit: 2023-06-06 | Discharge: 2023-06-06 | Disposition: A | Discharge status: Home / Self Care | Payer: Medicare Other | Source: Skilled Nursing Facility | Attending: Adult Health | Admitting: Adult Health

## 2023-06-06 DIAGNOSIS — N182 Chronic kidney disease, stage 2 (mild): Secondary | ICD-10-CM | POA: Diagnosis present

## 2023-06-06 LAB — BASIC METABOLIC PANEL
Anion gap: 5 (ref 5–15)
BUN: 40 mg/dL — ABNORMAL HIGH (ref 8–23)
CO2: 28 mmol/L (ref 22–32)
Calcium: 9.8 mg/dL (ref 8.9–10.3)
Chloride: 109 mmol/L (ref 98–111)
Creatinine, Ser: 1.15 mg/dL (ref 0.61–1.24)
GFR, Estimated: >60 mL/min (ref >60)
Glucose, Bld: 107 mg/dL — ABNORMAL HIGH (ref 70–99)
Potassium: 4.5 mmol/L (ref 3.5–5.1)
Sodium: 142 mmol/L (ref 135–145)

## 2023-06-08 ENCOUNTER — Other Ambulatory Visit (HOSPITAL_COMMUNITY)
Admission: RE | Admit: 2023-06-08 | Discharge: 2023-06-08 | Disposition: A | Discharge status: Home / Self Care | Payer: Medicare Other | Source: Skilled Nursing Facility | Attending: Adult Health | Admitting: Adult Health

## 2023-06-08 DIAGNOSIS — Z20822 Contact with and (suspected) exposure to covid-19: Secondary | ICD-10-CM | POA: Insufficient documentation

## 2023-06-08 DIAGNOSIS — R051 Acute cough: Secondary | ICD-10-CM | POA: Diagnosis not present

## 2023-06-08 DIAGNOSIS — Z1383 Encounter for screening for respiratory disorder NEC: Secondary | ICD-10-CM | POA: Insufficient documentation

## 2023-06-08 DIAGNOSIS — Z20828 Contact with and (suspected) exposure to other viral communicable diseases: Secondary | ICD-10-CM | POA: Insufficient documentation

## 2023-06-08 DIAGNOSIS — R0981 Nasal congestion: Secondary | ICD-10-CM | POA: Diagnosis not present

## 2023-06-08 LAB — RESP PANEL BY RT-PCR (RSV, FLU A&B, COVID)  RVPGX2
Influenza A by PCR: NEGATIVE
Influenza B by PCR: NEGATIVE
Resp Syncytial Virus by PCR: NEGATIVE
SARS Coronavirus 2 by RT PCR: NEGATIVE

## 2023-06-26 ENCOUNTER — Non-Acute Institutional Stay (SKILLED_NURSING_FACILITY): Payer: Medicare Other | Admitting: Adult Health

## 2023-06-26 ENCOUNTER — Encounter: Payer: Self-pay | Admitting: Adult Health

## 2023-06-26 DIAGNOSIS — F01518 Vascular dementia, unspecified severity, with other behavioral disturbance: Secondary | ICD-10-CM | POA: Diagnosis not present

## 2023-06-26 DIAGNOSIS — G8929 Other chronic pain: Secondary | ICD-10-CM

## 2023-06-26 DIAGNOSIS — M8580 Other specified disorders of bone density and structure, unspecified site: Secondary | ICD-10-CM

## 2023-06-26 DIAGNOSIS — I700 Atherosclerosis of aorta: Secondary | ICD-10-CM

## 2023-06-26 NOTE — Progress Notes (Signed)
Location:  [facility] Room Number: [id number] Place of Service:  SNF (31)   CODE STATUS: dnr   Allergies  Allergen Reactions   Hctz [Hydrochlorothiazide]     hypokalemia   Xanax [Alprazolam] Other (See Comments)    Causes hallucinations     Chief Complaint  Patient presents with   Medical Management of Chronic Issues         Osteopenia  Left hip pain:  Dementia without behavioral disturbance; unspecified dementia type /  failure to thrive in adult:      HPI:  He is a 89 year old long term resident of this facility being seen for the management of his chronic illnesses Osteopenia  Left hip pain:  Dementia without behavioral disturbance; unspecified dementia type /  failure to thrive in adult. He continues to gradually lose weight. He is currently at 137 pounds. There are no reports of uncontrolled pain; no reports of agitation present.   Past Medical History:  Diagnosis Date   Anxiety    Biliary dyskinesia    Cancer [facility])    prostate   Chronic chest wall pain    Closed displaced fracture of right femoral neck (HCC) 03/09/2020   9/30-10/10/2019 minimally displaced right femoral neck fracture sustained in a mechanical fall  Cannulated right hip pinning by Dr. Fuller Canada 10/1  Full dose aspirin prophylaxis through 11/5 with subsequent dosage of 81 mg daily.   Epididymitis 11/2021   Bilateral [facility]   Essential hypertension, benign 06/20/2016   Failure to thrive in adult 08/14/2022   GAD (generalized anxiety disorder)    GERD (gastroesophageal reflux disease)    H/O echocardiogram 2016   normal   Hallucination 08/31/2021   Heart murmur    "leaking heart valve"   High cholesterol 06/20/2016   HOH (hard of hearing)    left   Hx of falling    Hyperthyroiditis 04/04/2022   Inguinal hernia 01/15/2022   12/17/2021 Urology PA documented small right inguinal hernia which was easily reducible.  General surgery assessment scheduled for 01/29/2022.   There is a discrepancy between the severity of his complaints related to this and the physical findings.  He would be a high risk surgical candidate because of advanced age and other comorbidities.   Interstitial lung disease (HCC) 03/15/2020   03/12/2020 chronic interstitial changes stable compared to 2019 suggesting chronic interstitial lung disease.    Patient and family deny knowledge of this diagnosis; he has never been on maintenance oxygen.  Patient has never smoked.  He worked for over 2 decades at a [employer] and on one occasion was exposed to hydrochloric acid fumes.   Interstitial pulmonary disease (HCC)    Iron deficiency anemia due to dietary causes 03/22/2020   Low serum thyroid stimulating hormone (TSH) 08/17/2021   Lower GI bleed    MDD (major depressive disorder)    Murmur, cardiac    Poor historian    Prostate cancer (HCC)    PUD (peptic ulcer disease)    Right inguinal pain 01/29/2022   RLS (restless legs syndrome)    SARS-CoV-2 positive 09/13/2021   Shortness of breath    Vascular dementia without behavioral disturbance (HCC) 07/05/2022   Vitamin B 12 deficiency 09/03/2021   Wrist fracture 12/16/2011    Past Surgical History:  Procedure Laterality Date   CHOLECYSTECTOMY N/A 06/28/2020   Procedure: LAPAROSCOPIC CHOLECYSTECTOMY;  Surgeon: Lucretia Roers, MD;  Location: [facility];  Service: General;  Laterality: N/A;  pt in [facility]   COLONOSCOPY  2005   Rehman: Sigmoid colon diverticulosis, moderate sized external hemorrhoids. A few focal areas of mucosal erythema felt to be nonspecific.   ESOPHAGOGASTRODUODENOSCOPY     ESOPHAGOGASTRODUODENOSCOPY N/A 07/18/2016   Rehman: normal exam except duodenal scar. h.pylori serologies negative   FLEXIBLE SIGMOIDOSCOPY N/A 06/11/2020   Procedure: FLEXIBLE SIGMOIDOSCOPY;  Surgeon: Lanelle Bal, DO;  Location: [facility];  Service: Endoscopy;  Laterality: N/A;   HARDWARE REMOVAL Right 03/20/2021    Procedure: HARDWARE REMOVAL right hip;  Surgeon: Vickki Hearing, MD;  Location: [facility];  Service: Orthopedics;  Laterality: Right;   HIP PINNING,CANNULATED Right 03/10/2020   Procedure: INTERNAL FIXATION RIGHT HIP;  Surgeon: Vickki Hearing, MD;  Location: [facility];  Service: Orthopedics;  Laterality: Right;   ORIF WRIST FRACTURE  12/19/2011   Procedure: OPEN REDUCTION INTERNAL FIXATION (ORIF) WRIST FRACTURE;  Surgeon: Vickki Hearing, MD;  Location: [facility];  Service: Orthopedics;  Laterality: Right;   prostate cancer     Diagnosed in the 90s   TONSILLECTOMY      Social History   Socioeconomic History   Marital status: Widowed    Spouse name: Not on file   Number of children: Not on file   Years of education: Not on file   Highest education level: Not on file  Occupational History   Occupation: retired    Associate Professor: RETIRED  Tobacco Use   Smoking status: Never   Smokeless tobacco: Never  Vaping Use   Vaping status: Never Used  Substance and Sexual Activity   Alcohol use: No   Drug use: No   Sexual activity: Not Currently  Other Topics Concern   Not on file  Social History Narrative   Is long term resident of [facility]    Social Drivers of Health   Financial Resource Strain: Not on file  Food Insecurity: Not on file  Transportation Needs: Not on file  Physical Activity: Not on file  Stress: Not on file  Social Connections: Not on file  Intimate Partner Violence: Not on file   Family History  Problem Relation Age of Onset   Cancer Other    Heart attack Mother    Cancer Brother    Heart attack Brother    Heart disease Sister        by pass      VITAL SIGNS BP (!) 149/74   Pulse 65   Temp (!) 97.2 F (36.2 C)   Resp 20   Ht 5\' 7"  (1.702 m)   Wt 137 lb (62.1 kg)   SpO2 97%   BMI 21.46 kg/m   Outpatient Encounter Medications as of 06/26/2023  Medication Sig   acetaminophen (TYLENOL) 500 MG tablet Take 1,000 mg by mouth 3 (three) times daily. Max 3000mg   in 24 hour period   [employer] (VENELEX) OINT Apply topically. Apply to sacrum, coccyx and bilateral buttocks qshift.   busPIRone (BUSPAR) 10 MG tablet Take 10 mg by mouth 3 (three) times daily.   cloNIDine (CATAPRES) 0.1 MG tablet Take 0.1 mg by mouth 2 (two) times daily.   dilTIAZem HCl ER 300 MG TB24 Take 1 capsule by mouth daily.   famotidine (PEPCID) 40 MG tablet Take 40 mg by mouth at bedtime.   hydrALAZINE (APRESOLINE) 25 MG tablet Take 25 mg by mouth 3 (three) times daily.   isosorbide dinitrate (ISORDIL) 30 MG tablet Take 30 mg by mouth 2 (  two) times daily.   lisinopril (ZESTRIL) 40 MG tablet Take 40 mg by mouth daily. Hypertensive chronic kidney disease with stage 1 through stage 4 chronic kidney disease, or unspecified chronic kidney disease   metoprolol tartrate (LOPRESSOR) 50 MG tablet Take 50 mg by mouth 2 (two) times daily.   Nutritional Supplements (ENSURE ENLIVE PO) Take 1 Can by mouth 2 (two) times daily.   polyethylene glycol (MIRALAX / GLYCOLAX) 17 g packet Take 17 g by mouth 2 (two) times daily.   potassium chloride (MICRO-K) 10 MEQ CR capsule Take 40 mEq by mouth daily.   QUEtiapine (SEROQUEL) 25 MG tablet Take 12.5 mg by mouth 2 (two) times daily. 6 AM and 2 PM and 25 mg nightly   saline (AYR) GEL Place 1 Application into both nostrils every 6 (six) hours as needed.   UNABLE TO FIND Diet: DYS 3/Thin Liquids   venlafaxine (EFFEXOR) 75 MG tablet Take 75 mg by mouth 2 (two) times daily with a meal.   No facility-administered encounter medications on file as of 06/26/2023.     SIGNIFICANT DIAGNOSTIC EXAMS   PREVIOUS   01-23-22: dexa: t score -1.331  NO NEW EXAMS    LABS REVIEWED PREVIOUS       06-24-22: hgb 13.0; hct 40.4; tsh 1.153 free t4: 0.95  12-03-22: uric acid: 5.2 12-07-22: wbc 7.3; hgb 13.8; hct 41.5; mcv 87.4 plt 210; glucose 133; bun 24; creat 0.88; k+ 34; na++ 139; ca 9.4 gfr >60 protein 6.4 albumin 3.4  01-21-23: wbc 9.9; hgb 13.8; hct 43.7;  mcv 89.2 plt 266; glucose 125; bun 26; creat 0.91; k+ 3.2; na++ 140; ca 9.5; gfr >60; d-dimer 0.48 02-06-23: glucose 99; bun 52; creat 1.27; k+ 4.3; na++ 139; ca 9.1 gfr 53; protein 5.5 albumin 2.8; tsh 0.640  NO NEW LABS.   Review of Systems  Unable to perform ROS: Dementia   Physical Exam Constitutional:      General: He is not in acute distress.    Appearance: He is well-developed. He is not diaphoretic.  Neck:     Thyroid: No thyromegaly.  Cardiovascular:     Rate and Rhythm: Normal rate and regular rhythm.     Pulses: Normal pulses.     Heart sounds: Normal heart sounds.  Pulmonary:     Effort: Pulmonary effort is normal. No respiratory distress.     Breath sounds: Normal breath sounds.  Abdominal:     General: Bowel sounds are normal. There is no distension.     Palpations: Abdomen is soft.     Tenderness: There is no abdominal tenderness.  Musculoskeletal:        General: Normal range of motion.     Cervical back: Neck supple.     Right lower leg: No edema.     Left lower leg: No edema.  Lymphadenopathy:     Cervical: No cervical adenopathy.  Skin:    General: Skin is warm and dry.  Neurological:     Mental Status: He is alert. Mental status is at baseline.     Comments: 07-11-22: SLUMS 3/30         Psychiatric:        Mood and Affect: Mood normal.     ASSESSMENT/ PLAN:  TODAY  Osteopenia t score -1.331  2. Left hip pain: is chronic in nature: will continue tylenol 1 gm three times daily   3. Dementia without behavioral disturbance; unspecified dementia type /  failure to thrive in adult:  he is losing weight with his current weight at 137 pounds.    PREVIOUS     4. Aortic atherosclerosis (ct 06-09-20) no statin due to advanced age  82. Protein calorie malnutrition: albumin 3.7 will monitor   6. Gastroesophageal reflux disease without esophagitis: is off PPI    7. Prostate cancer/over active bladder: is not on medications due to history of falls; unable  to take myrbetriq due to hypertension  8. Hypokalemia: k+ 3.5 will continue k+ 40 meq daily   9. Chronic constipation: will continue miralax twice daily   10. CKD stage 3b bun 26 creat 0.91 gfr >60   11. Benign hypertension with chronic kidney disease stage III: b/p 149/74 will continue lopressor 50 mg twice daily; cardizem cr 300 mg daily; lisinopril 40 mg daily; hydralazine 25 mg three times daily; isordil 30 mg twice daily and clonidine to 0.1 mg twice daily   12. Major depression recurrent chronic; will continue effexor 75 mg twice daily and will continue seroquel 12.5 mg twice daily and 25 mg nightly will monitor   13. RLS: is now off requip   14. Iron deficiency anemia due to dietary intake: hgb 13.8 is off iron  15. Interstitial lung disease: will monitor  16. Vitamin B12 deficiency: will monitor    Synthia Innocent NP [facility]  call [phone]

## 2023-06-27 ENCOUNTER — Non-Acute Institutional Stay (SKILLED_NURSING_FACILITY): Payer: Self-pay | Admitting: Adult Health

## 2023-06-27 ENCOUNTER — Encounter: Payer: Self-pay | Admitting: Adult Health

## 2023-06-27 DIAGNOSIS — N182 Chronic kidney disease, stage 2 (mild): Secondary | ICD-10-CM | POA: Diagnosis not present

## 2023-06-27 DIAGNOSIS — I700 Atherosclerosis of aorta: Secondary | ICD-10-CM | POA: Diagnosis not present

## 2023-06-27 DIAGNOSIS — I129 Hypertensive chronic kidney disease with stage 1 through stage 4 chronic kidney disease, or unspecified chronic kidney disease: Secondary | ICD-10-CM | POA: Diagnosis not present

## 2023-06-27 DIAGNOSIS — F339 Major depressive disorder, recurrent, unspecified: Secondary | ICD-10-CM

## 2023-06-27 NOTE — Progress Notes (Signed)
Location:  [facility] Room Number: [id number] Place of Service:  SNF (31)   CODE STATUS: dnr   Allergies  Allergen Reactions   Hctz [Hydrochlorothiazide]     hypokalemia   Xanax [Alprazolam] Other (See Comments)    Causes hallucinations     Chief Complaint  Patient presents with   Acute Visit    Care plan meeting     HPI:  We have come together for his care plan meeting. Family present. BIMS 3/15 mood 6/30: decreased energy, not sleeping well. He is wheelchair bound with 3 falls one with a skin tear. He is dependent assist with his adl care. He is incontinent of bladder and frequently incontinent of bowel. Dietary: D3 diet appetite 1-50% weight is 137 pounds. Therapy none at this time. He is having periods of anxiety. His blood pressure readings are elevated. He will continue to be followed for his chronic illnesses including: Aortic atherosclerosis   Benign hypertension associated with chronic kidney disease stage II    Major depression recurrent chronic  Past Medical History:  Diagnosis Date   Anxiety    Biliary dyskinesia    Cancer [facility])    prostate   Chronic chest wall pain    Closed displaced fracture of right femoral neck (HCC) 03/09/2020   9/30-10/10/2019 minimally displaced right femoral neck fracture sustained in a mechanical fall  Cannulated right hip pinning by Dr. Fuller Canada 10/1  Full dose aspirin prophylaxis through 11/5 with subsequent dosage of 81 mg daily.   Epididymitis 11/2021   Bilateral [facility]   Essential hypertension, benign 06/20/2016   Failure to thrive in adult 08/14/2022   GAD (generalized anxiety disorder)    GERD (gastroesophageal reflux disease)    H/O echocardiogram 2016   normal   Hallucination 08/31/2021   Heart murmur    "leaking heart valve"   High cholesterol 06/20/2016   HOH (hard of hearing)    left   Hx of falling    Hyperthyroiditis 04/04/2022   Inguinal hernia 01/15/2022   12/17/2021 Urology PA  documented small right inguinal hernia which was easily reducible.  General surgery assessment scheduled for 01/29/2022.  There is a discrepancy between the severity of his complaints related to this and the physical findings.  He would be a high risk surgical candidate because of advanced age and other comorbidities.   Interstitial lung disease (HCC) 03/15/2020   03/12/2020 chronic interstitial changes stable compared to 2019 suggesting chronic interstitial lung disease.    Patient and family deny knowledge of this diagnosis; he has never been on maintenance oxygen.  Patient has never smoked.  He worked for over 2 decades at a [employer] and on one occasion was exposed to hydrochloric acid fumes.   Interstitial pulmonary disease (HCC)    Iron deficiency anemia due to dietary causes 03/22/2020   Low serum thyroid stimulating hormone (TSH) 08/17/2021   Lower GI bleed    MDD (major depressive disorder)    Murmur, cardiac    Poor historian    Prostate cancer (HCC)    PUD (peptic ulcer disease)    Right inguinal pain 01/29/2022   RLS (restless legs syndrome)    SARS-CoV-2 positive 09/13/2021   Shortness of breath    Vascular dementia without behavioral disturbance (HCC) 07/05/2022   Vitamin B 12 deficiency 09/03/2021   Wrist fracture 12/16/2011    Past Surgical History:  Procedure Laterality Date   CHOLECYSTECTOMY N/A 06/28/2020   Procedure:  LAPAROSCOPIC CHOLECYSTECTOMY;  Surgeon: Lucretia Roers, MD;  Location: [facility];  Service: General;  Laterality: N/A;  pt in [facility]   COLONOSCOPY  2005   Rehman: Sigmoid colon diverticulosis, moderate sized external hemorrhoids. A few focal areas of mucosal erythema felt to be nonspecific.   ESOPHAGOGASTRODUODENOSCOPY     ESOPHAGOGASTRODUODENOSCOPY N/A 07/18/2016   Rehman: normal exam except duodenal scar. h.pylori serologies negative   FLEXIBLE SIGMOIDOSCOPY N/A 06/11/2020   Procedure: FLEXIBLE SIGMOIDOSCOPY;  Surgeon: Lanelle Bal, DO;  Location: [facility];  Service: Endoscopy;  Laterality: N/A;   HARDWARE REMOVAL Right 03/20/2021   Procedure: HARDWARE REMOVAL right hip;  Surgeon: Vickki Hearing, MD;  Location: [facility];  Service: Orthopedics;  Laterality: Right;   HIP PINNING,CANNULATED Right 03/10/2020   Procedure: INTERNAL FIXATION RIGHT HIP;  Surgeon: Vickki Hearing, MD;  Location: [facility];  Service: Orthopedics;  Laterality: Right;   ORIF WRIST FRACTURE  12/19/2011   Procedure: OPEN REDUCTION INTERNAL FIXATION (ORIF) WRIST FRACTURE;  Surgeon: Vickki Hearing, MD;  Location: [facility];  Service: Orthopedics;  Laterality: Right;   prostate cancer     Diagnosed in the 90s   TONSILLECTOMY      Social History   Socioeconomic History   Marital status: Widowed    Spouse name: Not on file   Number of children: Not on file   Years of education: Not on file   Highest education level: Not on file  Occupational History   Occupation: retired    Associate Professor: RETIRED  Tobacco Use   Smoking status: Never   Smokeless tobacco: Never  Vaping Use   Vaping status: Never Used  Substance and Sexual Activity   Alcohol use: No   Drug use: No   Sexual activity: Not Currently  Other Topics Concern   Not on file  Social History Narrative   Is long [facility] resident of [facility]    Social Drivers of Health   Financial Resource Strain: Not on file  Food Insecurity: Not on file  Transportation Needs: Not on file  Physical Activity: Not on file  Stress: Not on file  Social Connections: Not on file  Intimate Partner Violence: Not on file   Family History  Problem Relation Age of Onset   Cancer Other    Heart attack Mother    Cancer Brother    Heart attack Brother    Heart disease Sister        by pass      VITAL SIGNS BP (!) 154/76   Pulse 65   Temp (!) 97.2 F (36.2 C)   Resp 20   Ht 5\' 7"  (1.702 m)   Wt 137 lb (62.1 kg)   SpO2 98%   BMI 21.46 kg/m   Outpatient Encounter Medications as of 06/27/2023   Medication Sig   acetaminophen (TYLENOL) 500 MG tablet Take 1,000 mg by mouth 3 (three) times daily. Max 3000mg  in 24 hour period   [employer] (VENELEX) OINT Apply topically. Apply to sacrum, coccyx and bilateral buttocks qshift.   busPIRone (BUSPAR) 10 MG tablet Take 10 mg by mouth 3 (three) times daily.   cloNIDine (CATAPRES) 0.1 MG tablet Take 0.1 mg by mouth 2 (two) times daily.   dilTIAZem HCl ER 300 MG TB24 Take 1 capsule by mouth daily.   famotidine (PEPCID) 40 MG tablet Take 40 mg by mouth at bedtime.   hydrALAZINE (APRESOLINE) 25 MG tablet Take 25 mg by mouth 3 (three)  times daily.   isosorbide dinitrate (ISORDIL) 30 MG tablet Take 30 mg by mouth 2 (two) times daily.   lisinopril (ZESTRIL) 40 MG tablet Take 40 mg by mouth daily. Hypertensive chronic kidney disease with stage 1 through stage 4 chronic kidney disease, or unspecified chronic kidney disease   metoprolol tartrate (LOPRESSOR) 50 MG tablet Take 50 mg by mouth 2 (two) times daily.   Nutritional Supplements (ENSURE ENLIVE PO) Take 1 Can by mouth 2 (two) times daily.   polyethylene glycol (MIRALAX / GLYCOLAX) 17 g packet Take 17 g by mouth 2 (two) times daily.   potassium chloride (MICRO-K) 10 MEQ CR capsule Take 40 mEq by mouth daily.   QUEtiapine (SEROQUEL) 25 MG tablet Take 12.5 mg by mouth 2 (two) times daily. 6 AM and 2 PM and 25 mg nightly   saline (AYR) GEL Place 1 Application into both nostrils every 6 (six) hours as needed.   UNABLE TO FIND Diet: DYS 3/Thin Liquids   venlafaxine (EFFEXOR) 75 MG tablet Take 75 mg by mouth 2 (two) times daily with a meal.   No facility-administered encounter medications on file as of 06/27/2023.     SIGNIFICANT DIAGNOSTIC EXAMS  PREVIOUS   01-23-22: dexa: t score -1.331  NO NEW EXAMS   LABS REVIEWED PREVIOUS       06-24-22: hgb 13.0; hct 40.4; tsh 1.153 free t4: 0.95  12-03-22: uric acid: 5.2 12-07-22: wbc 7.3; hgb 13.8; hct 41.5; mcv 87.4 plt 210; glucose 133; bun  24; creat 0.88; k+ 34; na++ 139; ca 9.4 gfr >60 protein 6.4 albumin 3.4  01-21-23: wbc 9.9; hgb 13.8; hct 43.7; mcv 89.2 plt 266; glucose 125; bun 26; creat 0.91; k+ 3.2; na++ 140; ca 9.5; gfr >60; d-dimer 0.48 02-06-23: glucose 99; bun 52; creat 1.27; k+ 4.3; na++ 139; ca 9.1 gfr 53; protein 5.5 albumin 2.8; tsh 0.640  NO NEW LABS.   Review of Systems  Unable to perform ROS: Dementia   Physical Exam Constitutional:      General: He is not in acute distress.    Appearance: He is underweight. He is not diaphoretic.  Neck:     Thyroid: No thyromegaly.  Cardiovascular:     Rate and Rhythm: Normal rate and regular rhythm.     Pulses: Normal pulses.     Heart sounds: Normal heart sounds.  Pulmonary:     Effort: Pulmonary effort is normal. No respiratory distress.     Breath sounds: Normal breath sounds.  Abdominal:     General: Bowel sounds are normal. There is no distension.     Palpations: Abdomen is soft.     Tenderness: There is no abdominal tenderness.  Musculoskeletal:        General: Normal range of motion.     Cervical back: Neck supple.     Right lower leg: No edema.     Left lower leg: No edema.  Lymphadenopathy:     Cervical: No cervical adenopathy.  Skin:    General: Skin is warm and dry.  Neurological:     Mental Status: He is alert. Mental status is at baseline.     Comments:  07-11-22: SLUMS 3/30              Psychiatric:        Mood and Affect: Mood normal.      ASSESSMENT/ PLAN:  TODAY  Aortic atherosclerosis Benign hypertension associated with chronic kidney disease stage II Major depression recurrent chronic  Will  increase buspar to 15 mg three times daily  Will increase clonidine to 0.2 mg twice daily  Will continue to monitor his status.   Time spent with patient: 40 minutes: medications; overall status; dietary.    Synthia Innocent NP [facility]  call [phone]

## 2023-07-14 ENCOUNTER — Encounter: Payer: Self-pay | Admitting: Student

## 2023-07-14 ENCOUNTER — Non-Acute Institutional Stay (INDEPENDENT_AMBULATORY_CARE_PROVIDER_SITE_OTHER): Payer: Self-pay | Admitting: Student

## 2023-07-14 DIAGNOSIS — F339 Major depressive disorder, recurrent, unspecified: Secondary | ICD-10-CM

## 2023-07-14 DIAGNOSIS — E441 Mild protein-calorie malnutrition: Secondary | ICD-10-CM

## 2023-07-14 DIAGNOSIS — F01518 Vascular dementia, unspecified severity, with other behavioral disturbance: Secondary | ICD-10-CM

## 2023-07-14 DIAGNOSIS — I129 Hypertensive chronic kidney disease with stage 1 through stage 4 chronic kidney disease, or unspecified chronic kidney disease: Secondary | ICD-10-CM | POA: Diagnosis not present

## 2023-07-14 DIAGNOSIS — N182 Chronic kidney disease, stage 2 (mild): Secondary | ICD-10-CM | POA: Diagnosis not present

## 2023-07-14 NOTE — Addendum Note (Signed)
Addended by: Sharee Holster on: 07/14/2023 11:40 AM   Modules accepted: Level of Service

## 2023-07-14 NOTE — Progress Notes (Signed)
Location:  [facility]   Place of Service:   [facility]    CODE STATUS: DNR  Allergies  Allergen Reactions   Hctz [Hydrochlorothiazide]     hypokalemia   Xanax [Alprazolam] Other (See Comments)    Causes hallucinations     HPI: Jeffrey Sherman was visited by the following members of the [facility]: Dr. Alfredo Martinez (PGY-2) and Dr. Tawanna Cooler McDiarmid (faculty).    Previous Visits:  Patient had care plan meeting with family on 06/27/23. "BIMS 3/15 mood 06/30: decreased energy, not sleeping well." For depression, Buspar was increased to 15 mg TID, Clonidine 0.2 BID. He had reported period of anxiety. Is wheelchair bound with three previous falls, one with skin tear.   Per MOST Form effective 07/01/22: Do Not Attempt Resuscitation (DNR/no CPR), Comfort Measures: Keep clean, warm and dry. Use medication by any route, positioning, wound care and other measures to relieve pain and suffering. Use oxygen, suction and manual treatment of airway obstruction as needed for comfort. Do not transfer to hospital unless comfort needs cannot be met in current location. No antibiotics. No IV fluids. No feeding tube.  Past Medical History:  Diagnosis Date   Anxiety    Biliary dyskinesia    Cancer [facility])    prostate   Chronic chest wall pain    Closed displaced fracture of right femoral neck (HCC) 03/09/2020   9/30-10/10/2019 minimally displaced right femoral neck fracture sustained in a mechanical fall  Cannulated right hip pinning by Dr. Fuller Canada 10/1  Full dose aspirin prophylaxis through 11/5 with subsequent dosage of 81 mg daily.   Epididymitis 11/2021   Bilateral [facility]   Essential hypertension, benign 06/20/2016   Failure to thrive in adult 08/14/2022   GAD (generalized anxiety disorder)    GERD (gastroesophageal reflux disease)    H/O echocardiogram 2016   normal   Hallucination 08/31/2021   Heart murmur    "leaking heart valve"    High cholesterol 06/20/2016   HOH (hard of hearing)    left   Hx of falling    Hyperthyroiditis 04/04/2022   Inguinal hernia 01/15/2022   12/17/2021 Urology PA documented small right inguinal hernia which was easily reducible.  General surgery assessment scheduled for 01/29/2022.  There is a discrepancy between the severity of his complaints related to this and the physical findings.  He would be a high risk surgical candidate because of advanced age and other comorbidities.   Interstitial lung disease (HCC) 03/15/2020   03/12/2020 chronic interstitial changes stable compared to 2019 suggesting chronic interstitial lung disease.    Patient and family deny knowledge of this diagnosis; he has never been on maintenance oxygen.  Patient has never smoked.  He worked for over 2 decades at a [employer] and on one occasion was exposed to hydrochloric acid fumes.   Interstitial pulmonary disease (HCC)    Iron deficiency anemia due to dietary causes 03/22/2020   Low serum thyroid stimulating hormone (TSH) 08/17/2021   Lower GI bleed    MDD (major depressive disorder)    Murmur, cardiac    Poor historian    Prostate cancer (HCC)    PUD (peptic ulcer disease)    Right inguinal pain 01/29/2022   RLS (restless legs syndrome)    SARS-CoV-2 positive 09/13/2021   Shortness of breath    Vascular dementia without behavioral disturbance (HCC) 07/05/2022   Vitamin B 12 deficiency 09/03/2021   Wrist fracture  12/16/2011    Past Surgical History:  Procedure Laterality Date   CHOLECYSTECTOMY N/A 06/28/2020   Procedure: LAPAROSCOPIC CHOLECYSTECTOMY;  Surgeon: Lucretia Roers, MD;  Location: [facility];  Service: General;  Laterality: N/A;  pt in [facility]   COLONOSCOPY  2005   Rehman: Sigmoid colon diverticulosis, moderate sized external hemorrhoids. A few focal areas of mucosal erythema felt to be nonspecific.   ESOPHAGOGASTRODUODENOSCOPY     ESOPHAGOGASTRODUODENOSCOPY N/A 07/18/2016    Rehman: normal exam except duodenal scar. h.pylori serologies negative   FLEXIBLE SIGMOIDOSCOPY N/A 06/11/2020   Procedure: FLEXIBLE SIGMOIDOSCOPY;  Surgeon: Lanelle Bal, DO;  Location: [facility];  Service: Endoscopy;  Laterality: N/A;   HARDWARE REMOVAL Right 03/20/2021   Procedure: HARDWARE REMOVAL right hip;  Surgeon: Vickki Hearing, MD;  Location: [facility];  Service: Orthopedics;  Laterality: Right;   HIP PINNING,CANNULATED Right 03/10/2020   Procedure: INTERNAL FIXATION RIGHT HIP;  Surgeon: Vickki Hearing, MD;  Location: [facility];  Service: Orthopedics;  Laterality: Right;   ORIF WRIST FRACTURE  12/19/2011   Procedure: OPEN REDUCTION INTERNAL FIXATION (ORIF) WRIST FRACTURE;  Surgeon: Vickki Hearing, MD;  Location: [facility];  Service: Orthopedics;  Laterality: Right;   prostate cancer     Diagnosed in the 90s   TONSILLECTOMY      Social History   Socioeconomic History   Marital status: Widowed    Spouse name: Not on file   Number of children: Not on file   Years of education: Not on file   Highest education level: Not on file  Occupational History   Occupation: retired    Associate Professor: RETIRED  Tobacco Use   Smoking status: Never   Smokeless tobacco: Never  Vaping Use   Vaping status: Never Used  Substance and Sexual Activity   Alcohol use: No   Drug use: No   Sexual activity: Not Currently  Other Topics Concern   Not on file  Social History Narrative   Is long term resident of [facility]    Social Drivers of Health   Financial Resource Strain: Not on file  Food Insecurity: Not on file  Transportation Needs: Not on file  Physical Activity: Not on file  Stress: Not on file  Social Connections: Not on file  Intimate Partner Violence: Not on file   Family History  Problem Relation Age of Onset   Cancer Other    Heart attack Mother    Cancer Brother    Heart attack Brother    Heart disease Sister        by pass   ROS: limited 2/2 mental status. No  complaints today    VITAL SIGNS Recent vitals assessed through [facility]  Physical Exam Vitals reviewed.  Constitutional:      General: He is not in acute distress.    Appearance: Normal appearance. He is not ill-appearing.  HENT:     Head: Normocephalic and atraumatic.     Nose: Nose normal.     Mouth/Throat:     Mouth: Mucous membranes are moist.  Eyes:     Conjunctiva/sclera: Conjunctivae normal.  Cardiovascular:     Rate and Rhythm: Normal rate.     Heart sounds: Normal heart sounds.  Pulmonary:     Effort: Pulmonary effort is normal.     Breath sounds: Normal breath sounds.  Abdominal:     General: Abdomen is flat. Bowel sounds are normal.     Palpations: Abdomen is soft.  Musculoskeletal:  General: Normal range of motion.     Cervical back: Normal range of motion.     Comments:  RLE contractures with limited extension below knee  Skin:    General: Skin is warm.     Capillary Refill: Capillary refill takes 2 to 3 seconds.  Neurological:     Mental Status: He is alert.  Psychiatric:        Mood and Affect: Mood normal.      Outpatient Encounter Medications as of 07/14/2023  Medication Sig   acetaminophen (TYLENOL) 500 MG tablet Take 1,000 mg by mouth 3 (three) times daily. Max 3000mg  in 24 hour period   [employer] (VENELEX) OINT Apply topically. Apply to sacrum, coccyx and bilateral buttocks qshift.   busPIRone (BUSPAR) 15 MG tablet Take 15 mg by mouth 3 (three) times daily.   cloNIDine (CATAPRES) 0.2 MG tablet Take 0.2 mg by mouth 2 (two) times daily.   dilTIAZem HCl ER 300 MG TB24 Take 1 capsule by mouth daily.   famotidine (PEPCID) 40 MG tablet Take 40 mg by mouth at bedtime.   hydrALAZINE (APRESOLINE) 25 MG tablet Take 25 mg by mouth 3 (three) times daily.   isosorbide dinitrate (ISORDIL) 30 MG tablet Take 30 mg by mouth 2 (two) times daily.   lisinopril (ZESTRIL) 40 MG tablet Take 40 mg by mouth daily. Hypertensive chronic kidney disease  with stage 1 through stage 4 chronic kidney disease, or unspecified chronic kidney disease   metoprolol tartrate (LOPRESSOR) 50 MG tablet Take 50 mg by mouth 2 (two) times daily.   Nutritional Supplements (ENSURE ENLIVE PO) Take 1 Can by mouth 2 (two) times daily.   polyethylene glycol (MIRALAX / GLYCOLAX) 17 g packet Take 17 g by mouth 2 (two) times daily.   potassium chloride (MICRO-K) 10 MEQ CR capsule Take 40 mEq by mouth daily.   QUEtiapine (SEROQUEL) 25 MG tablet Take 12.5 mg by mouth 2 (two) times daily. 6 AM and 2 PM and 25 mg nightly   saline (AYR) GEL Place 1 Application into both nostrils every 6 (six) hours as needed.   UNABLE TO FIND Diet: DYS 3/Thin Liquids   venlafaxine (EFFEXOR) 75 MG tablet Take 75 mg by mouth 2 (two) times daily with a meal.   No facility-administered encounter medications on file as of 07/14/2023.     SIGNIFICANT DIAGNOSTIC EXAMS   PREVIOUS    09-10-21: chest x-ray: patch asymmetric inflammatory changes. These could represent areas of atypical infection; focal atelectasis infarction or evolving consolidation among other possibilities.   11-15-21: scrotal ultrasounds No evidence of intratesticular   mass or torsion bilateral  No evidence of varicoceles bilateral  Small sized right hydrocele Bilateral epididymal head is enlarges may represent epididymitis   CT head 12/07/22: Chronic atrophic and ischemic changes without acute abnormality.   XR right hand 12/07/22: No evidence of acute right hand fracture.   CXR 12/07/22: No acute chest disease    LABS REVIEWED PREVIOUS      06/06/23 BUN 40, Ca 9.8, Cl 109, Cr 1.15, AG 5, GFR >60, Gluc 107, K 4.5, Na 142  ASSESSMENT/ PLAN:   Recurrent Falls  Wheelchair bound with three recent falls documented. Continue with fall precautions and monitoring as able. No acute change in mental status on examination today.   Vascular Dementia with Behavioral Disturbance  Buspar was increased to 15 mg TID, Clonidine 0.2  BID on last recorded Care Plan Meeting, Seroquel 12.5 mg BID and 25 mg at  bedtime. On venlafaxine 75 twice a day as well for depression.  Monitor for mood changes and assess for aggression as indicated   Major depression GAD Buspar was increased to 15 mg TID, Clonidine 0.2 BID on last recorded Care Plan Meeting.   Chronic Knee Pain  Tylenol 1gm TID   Aortic Atherosclerosis  HTN Cardizem 300 mg daily, Hydralazine 25 three times a day, Lisinopril 40 mg daily, metoprolol tartrate 50 mg twice a day, isosorbide dinitrate 30 BID. No statin indicated.   Gastroesophageal reflux disease without esophagitis No longer on PPI    Chronic constipation Miralax for comfort, most recent BM documented over last couple of days.   Failure to thrive in adult  weight loss Worsening function 2/2 dementia. Continuing with comfort care measures, PRN medications to assist with comfort. Assist with meals, cutting meat for eating. See MOST form above in H&P.   Has seen progressive weight loss over last few months. 148>137 lbs. Monitoring Albumin.    Hypokalemia Requires K supplementation. Continue 40 mEq K daily    New onset seizure (HCC) Keppra was weaned.   RLS  Insomnia  Off of Requip   Emaad Nanna [employer] PGY-3 [facility] Residency  [facility]

## 2023-07-30 ENCOUNTER — Non-Acute Institutional Stay (SKILLED_NURSING_FACILITY): Payer: Self-pay | Admitting: Adult Health

## 2023-07-30 ENCOUNTER — Encounter: Payer: Self-pay | Admitting: Adult Health

## 2023-07-30 DIAGNOSIS — K219 Gastro-esophageal reflux disease without esophagitis: Secondary | ICD-10-CM

## 2023-07-30 DIAGNOSIS — I700 Atherosclerosis of aorta: Secondary | ICD-10-CM | POA: Diagnosis not present

## 2023-07-30 DIAGNOSIS — E441 Mild protein-calorie malnutrition: Secondary | ICD-10-CM | POA: Diagnosis not present

## 2023-07-30 NOTE — Progress Notes (Signed)
 Location:  [facility] Room Number: 156 Place of Service:  SNF (31)   CODE STATUS: dnr   Allergies  Allergen Reactions   Hctz [Hydrochlorothiazide]     hypokalemia   Xanax [Alprazolam] Other (See Comments)    Causes hallucinations     Chief Complaint  Patient presents with   Medical Management of Chronic Issues         Aortic atherosclerosis    Protein calorie malnutrition: Gastroesophageal reflux disease without esophagitis:     HPI:  He is being seen for the management of his chronic illnesses: Aortic atherosclerosis    Protein calorie malnutrition: Gastroesophageal reflux disease without esophagitis. There are no reports of uncontrolled pain. There are no reports of agitation or anxiety present. He does have occasional falls without injury.   Past Medical History:  Diagnosis Date   Anxiety    Biliary dyskinesia    Cancer [facility])    prostate   Chronic chest wall pain    Closed displaced fracture of right femoral neck (HCC) 03/09/2020   9/30-10/10/2019 minimally displaced right femoral neck fracture sustained in a mechanical fall  Cannulated right hip pinning by Dr. Fuller Canada 10/1  Full dose aspirin prophylaxis through 11/5 with subsequent dosage of 81 mg daily.   Epididymitis 11/2021   Bilateral [facility]   Essential hypertension, benign 06/20/2016   Failure to thrive in adult 08/14/2022   GAD (generalized anxiety disorder)    GERD (gastroesophageal reflux disease)    H/O echocardiogram 2016   normal   Hallucination 08/31/2021   Heart murmur    "leaking heart valve"   High cholesterol 06/20/2016   HOH (hard of hearing)    left   Hx of falling    Hyperthyroiditis 04/04/2022   Inguinal hernia 01/15/2022   12/17/2021 Urology PA documented small right inguinal hernia which was easily reducible.  General surgery assessment scheduled for 01/29/2022.  There is a discrepancy between the severity of his complaints related to this and the  physical findings.  He would be a high risk surgical candidate because of advanced age and other comorbidities.   Interstitial lung disease (HCC) 03/15/2020   03/12/2020 chronic interstitial changes stable compared to 2019 suggesting chronic interstitial lung disease.    Patient and family deny knowledge of this diagnosis; he has never been on maintenance oxygen.  Patient has never smoked.  He worked for over 2 decades at a [employer] and on one occasion was exposed to hydrochloric acid fumes.   Interstitial pulmonary disease (HCC)    Iron deficiency anemia due to dietary causes 03/22/2020   Low serum thyroid stimulating hormone (TSH) 08/17/2021   Lower GI bleed    MDD (major depressive disorder)    Murmur, cardiac    Poor historian    Prostate cancer (HCC)    PUD (peptic ulcer disease)    Right inguinal pain 01/29/2022   RLS (restless legs syndrome)    SARS-CoV-2 positive 09/13/2021   Shortness of breath    Vascular dementia without behavioral disturbance (HCC) 07/05/2022   Vitamin B 12 deficiency 09/03/2021   Wrist fracture 12/16/2011    Past Surgical History:  Procedure Laterality Date   CHOLECYSTECTOMY N/A 06/28/2020   Procedure: LAPAROSCOPIC CHOLECYSTECTOMY;  Surgeon: Lucretia Roers, MD;  Location: [facility];  Service: General;  Laterality: N/A;  pt in [facility]   COLONOSCOPY  2005   Rehman: Sigmoid colon diverticulosis, moderate sized external hemorrhoids. A few focal  areas of mucosal erythema felt to be nonspecific.   ESOPHAGOGASTRODUODENOSCOPY     ESOPHAGOGASTRODUODENOSCOPY N/A 07/18/2016   Rehman: normal exam except duodenal scar. h.pylori serologies negative   FLEXIBLE SIGMOIDOSCOPY N/A 06/11/2020   Procedure: FLEXIBLE SIGMOIDOSCOPY;  Surgeon: Lanelle Bal, DO;  Location: [facility];  Service: Endoscopy;  Laterality: N/A;   HARDWARE REMOVAL Right 03/20/2021   Procedure: HARDWARE REMOVAL right hip;  Surgeon: Vickki Hearing, MD;  Location: [facility];   Service: Orthopedics;  Laterality: Right;   HIP PINNING,CANNULATED Right 03/10/2020   Procedure: INTERNAL FIXATION RIGHT HIP;  Surgeon: Vickki Hearing, MD;  Location: [facility];  Service: Orthopedics;  Laterality: Right;   ORIF WRIST FRACTURE  12/19/2011   Procedure: OPEN REDUCTION INTERNAL FIXATION (ORIF) WRIST FRACTURE;  Surgeon: Vickki Hearing, MD;  Location: [facility];  Service: Orthopedics;  Laterality: Right;   prostate cancer     Diagnosed in the 90s   TONSILLECTOMY      Social History   Socioeconomic History   Marital status: Widowed    Spouse name: Not on file   Number of children: Not on file   Years of education: Not on file   Highest education level: Not on file  Occupational History   Occupation: retired    Associate Professor: RETIRED  Tobacco Use   Smoking status: Never   Smokeless tobacco: Never  Vaping Use   Vaping status: Never Used  Substance and Sexual Activity   Alcohol use: No   Drug use: No   Sexual activity: Not Currently  Other Topics Concern   Not on file  Social History Narrative   Is long term resident of [facility]    Social Drivers of Health   Financial Resource Strain: Not on file  Food Insecurity: Not on file  Transportation Needs: Not on file  Physical Activity: Not on file  Stress: Not on file  Social Connections: Not on file  Intimate Partner Violence: Not on file   Family History  Problem Relation Age of Onset   Cancer Other    Heart attack Mother    Cancer Brother    Heart attack Brother    Heart disease Sister        by pass      VITAL SIGNS BP 139/78   Pulse (!) 57   Temp 97.8 F (36.6 C)   Resp 18   Ht 5\' 7"  (1.702 m)   Wt 142 lb 3.2 oz (64.5 kg)   SpO2 97%   BMI 22.27 kg/m   Outpatient Encounter Medications as of 07/30/2023  Medication Sig   acetaminophen (TYLENOL) 500 MG tablet Take 1,000 mg by mouth 3 (three) times daily. Max 3000mg  in 24 hour period   [employer] (VENELEX) OINT Apply topically. Apply to  sacrum, coccyx and bilateral buttocks qshift.   busPIRone (BUSPAR) 15 MG tablet Take 15 mg by mouth 3 (three) times daily.   cloNIDine (CATAPRES) 0.2 MG tablet Take 0.2 mg by mouth 2 (two) times daily.   dilTIAZem HCl ER 300 MG TB24 Take 1 capsule by mouth daily.   famotidine (PEPCID) 40 MG tablet Take 40 mg by mouth at bedtime.   hydrALAZINE (APRESOLINE) 25 MG tablet Take 25 mg by mouth 3 (three) times daily.   isosorbide dinitrate (ISORDIL) 30 MG tablet Take 30 mg by mouth 2 (two) times daily.   lisinopril (ZESTRIL) 40 MG tablet Take 40 mg by mouth daily. Hypertensive chronic kidney disease with stage 1 through  stage 4 chronic kidney disease, or unspecified chronic kidney disease   metoprolol tartrate (LOPRESSOR) 50 MG tablet Take 50 mg by mouth 2 (two) times daily.   Nutritional Supplements (ENSURE ENLIVE PO) Take 1 Can by mouth 2 (two) times daily.   polyethylene glycol (MIRALAX / GLYCOLAX) 17 g packet Take 17 g by mouth 2 (two) times daily.   potassium chloride (MICRO-K) 10 MEQ CR capsule Take 40 mEq by mouth daily.   QUEtiapine (SEROQUEL) 25 MG tablet Take 12.5 mg by mouth 2 (two) times daily. 6 AM and 2 PM and 25 mg nightly   saline (AYR) GEL Place 1 Application into both nostrils every 6 (six) hours as needed.   UNABLE TO FIND Diet: DYS 3/Thin Liquids   venlafaxine (EFFEXOR) 75 MG tablet Take 75 mg by mouth 2 (two) times daily with a meal.   No facility-administered encounter medications on file as of 07/30/2023.     SIGNIFICANT DIAGNOSTIC EXAMS  PREVIOUS   01-23-22: dexa: t score -1.331  NO NEW EXAMS    LABS REVIEWED PREVIOUS       06-24-22: hgb 13.0; hct 40.4; tsh 1.153 free t4: 0.95  12-03-22: uric acid: 5.2 12-07-22: wbc 7.3; hgb 13.8; hct 41.5; mcv 87.4 plt 210; glucose 133; bun 24; creat 0.88; k+ 34; na++ 139; ca 9.4 gfr >60 protein 6.4 albumin 3.4  01-21-23: wbc 9.9; hgb 13.8; hct 43.7; mcv 89.2 plt 266; glucose 125; bun 26; creat 0.91; k+ 3.2; na++ 140; ca 9.5; gfr >60;  d-dimer 0.48 02-06-23: glucose 99; bun 52; creat 1.27; k+ 4.3; na++ 139; ca 9.1 gfr 53; protein 5.5 albumin 2.8; tsh 0.640  NO NEW LABS.   Review of Systems  Unable to perform ROS: Dementia   Physical Exam Constitutional:      General: He is not in acute distress.    Appearance: He is well-developed. He is not diaphoretic.  Neck:     Thyroid: No thyromegaly.  Cardiovascular:     Rate and Rhythm: Normal rate and regular rhythm.     Heart sounds: Normal heart sounds.  Pulmonary:     Effort: Pulmonary effort is normal. No respiratory distress.     Breath sounds: Normal breath sounds.  Abdominal:     General: Bowel sounds are normal. There is no distension.     Palpations: Abdomen is soft.     Tenderness: There is no abdominal tenderness.  Musculoskeletal:        General: Normal range of motion.     Cervical back: Neck supple.     Right lower leg: No edema.     Left lower leg: No edema.  Lymphadenopathy:     Cervical: No cervical adenopathy.  Skin:    General: Skin is warm and dry.  Neurological:     Mental Status: He is alert. Mental status is at baseline.     Comments: SLUMS 3/30     Psychiatric:        Mood and Affect: Mood normal.     ASSESSMENT/ PLAN:  TODAY  Aortic atherosclerosis (ct 06-09-20) no statin due to advanced age  88. Protein calorie malnutrition: albumin 3.7 will continue supplements as directed  3. Gastroesophageal reflux disease without esophagitis: is off PPI    PREVIOUS      4. Prostate cancer/over active bladder: is not on medications due to history of falls; unable to take myrbetriq due to hypertension  5. Hypokalemia: k+ 3.5 will continue k+ 40 meq daily  6. Chronic constipation: will continue miralax twice daily   7. CKD stage 3b bun 26 creat 0.91 gfr >60   8. Benign hypertension with chronic kidney disease stage III: b/p 139/78 will continue lopressor 50 mg twice daily; cardizem cr 300 mg daily; lisinopril 40 mg daily; hydralazine 25  mg three times daily; isordil 30 mg twice daily and clonidine to 0.1 mg twice daily   9. Major depression recurrent chronic; will continue effexor 75 mg twice daily and will continue seroquel 12.5 mg twice daily and 25 mg nightly will monitor   10. RLS: is now off requip   11. Iron deficiency anemia due to dietary intake: hgb 13.8 is off iron  12. Interstitial lung disease: will monitor  13. Vitamin B12 deficiency: will monitor   14. Osteopenia t score -1.331  15. Left hip pain: is chronic in nature: will continue tylenol 1 gm three times daily   16. Dementia without behavioral disturbance; unspecified dementia type /  failure to thrive in adult: he is losing weight with his current weight at 142 pounds.     Synthia Innocent NP [facility]  call [phone]

## 2023-08-15 ENCOUNTER — Other Ambulatory Visit: Payer: Self-pay | Admitting: Adult Health

## 2023-08-15 ENCOUNTER — Non-Acute Institutional Stay (SKILLED_NURSING_FACILITY): Payer: Self-pay | Admitting: Adult Health

## 2023-08-15 DIAGNOSIS — J849 Interstitial pulmonary disease, unspecified: Secondary | ICD-10-CM

## 2023-08-15 DIAGNOSIS — F01518 Vascular dementia, unspecified severity, with other behavioral disturbance: Secondary | ICD-10-CM | POA: Diagnosis not present

## 2023-08-15 DIAGNOSIS — R627 Adult failure to thrive: Secondary | ICD-10-CM | POA: Diagnosis not present

## 2023-08-15 MED ORDER — MORPHINE SULFATE (CONCENTRATE) 20 MG/ML PO SOLN: 5.0000 mg | ORAL | 0 refills | Status: AC | PRN

## 2023-08-16 ENCOUNTER — Encounter: Payer: Self-pay | Admitting: Adult Health

## 2023-08-16 NOTE — Progress Notes (Signed)
 Location:  [facility] Room Number: 156 Place of Service:  SNF (31)   CODE STATUS: dnr   Allergies  Allergen Reactions   Hctz [Hydrochlorothiazide]     hypokalemia   Xanax [Alprazolam] Other (See Comments)    Causes hallucinations     Chief Complaint  Patient presents with   Acute Visit    Change in status     HPI:  He has had a change in status. His 02 sats are in the 70-80's. There are no reports of cough. He is more lethargic he has required 02 at 3 liters. He has not gotten out of bed. There are no reports of fevers. The focus of his care is for comfort only.   Past Medical History:  Diagnosis Date   Anxiety    Biliary dyskinesia    Cancer [facility])    prostate   Chronic chest wall pain    Closed displaced fracture of right femoral neck (HCC) 03/09/2020   9/30-10/10/2019 minimally displaced right femoral neck fracture sustained in a mechanical fall  Cannulated right hip pinning by Dr. Fuller Canada 10/1  Full dose aspirin prophylaxis through 11/5 with subsequent dosage of 81 mg daily.   Epididymitis 11/2021   Bilateral [facility]   Essential hypertension, benign 06/20/2016   Failure to thrive in adult 08/14/2022   GAD (generalized anxiety disorder)    GERD (gastroesophageal reflux disease)    H/O echocardiogram 2016   normal   Hallucination 08/31/2021   Heart murmur    "leaking heart valve"   High cholesterol 06/20/2016   HOH (hard of hearing)    left   Hx of falling    Hyperthyroiditis 04/04/2022   Inguinal hernia 01/15/2022   12/17/2021 Urology PA documented small right inguinal hernia which was easily reducible.  General surgery assessment scheduled for 01/29/2022.  There is a discrepancy between the severity of his complaints related to this and the physical findings.  He would be a high risk surgical candidate because of advanced age and other comorbidities.   Interstitial lung disease (HCC) 03/15/2020   03/12/2020 chronic  interstitial changes stable compared to 2019 suggesting chronic interstitial lung disease.    Patient and family deny knowledge of this diagnosis; he has never been on maintenance oxygen.  Patient has never smoked.  He worked for over 2 decades at a [employer] and on one occasion was exposed to hydrochloric acid fumes.   Interstitial pulmonary disease (HCC)    Iron deficiency anemia due to dietary causes 03/22/2020   Low serum thyroid stimulating hormone (TSH) 08/17/2021   Lower GI bleed    MDD (major depressive disorder)    Murmur, cardiac    Poor historian    Prostate cancer (HCC)    PUD (peptic ulcer disease)    Right inguinal pain 01/29/2022   RLS (restless legs syndrome)    SARS-CoV-2 positive 09/13/2021   Shortness of breath    Vascular dementia without behavioral disturbance (HCC) 07/05/2022   Vitamin B 12 deficiency 09/03/2021   Wrist fracture 12/16/2011    Past Surgical History:  Procedure Laterality Date   CHOLECYSTECTOMY N/A 06/28/2020   Procedure: LAPAROSCOPIC CHOLECYSTECTOMY;  Surgeon: Lucretia Roers, MD;  Location: [facility] ORS;  Service: General;  Laterality: N/A;  pt in [facility]   COLONOSCOPY  2005   Rehman: Sigmoid colon diverticulosis, moderate sized external hemorrhoids. A few focal areas of mucosal erythema felt to be nonspecific.   ESOPHAGOGASTRODUODENOSCOPY  ESOPHAGOGASTRODUODENOSCOPY N/A 07/18/2016   Rehman: normal exam except duodenal scar. h.pylori serologies negative   FLEXIBLE SIGMOIDOSCOPY N/A 06/11/2020   Procedure: FLEXIBLE SIGMOIDOSCOPY;  Surgeon: Lanelle Bal, DO;  Location: [facility] SUITE;  Service: Endoscopy;  Laterality: N/A;   HARDWARE REMOVAL Right 03/20/2021   Procedure: HARDWARE REMOVAL right hip;  Surgeon: Vickki Hearing, MD;  Location: [facility] ORS;  Service: Orthopedics;  Laterality: Right;   HIP PINNING,CANNULATED Right 03/10/2020   Procedure: INTERNAL FIXATION RIGHT HIP;  Surgeon: Vickki Hearing, MD;  Location: [facility]  ORS;  Service: Orthopedics;  Laterality: Right;   ORIF WRIST FRACTURE  12/19/2011   Procedure: OPEN REDUCTION INTERNAL FIXATION (ORIF) WRIST FRACTURE;  Surgeon: Vickki Hearing, MD;  Location: [facility];  Service: Orthopedics;  Laterality: Right;   prostate cancer     Diagnosed in the 90s   TONSILLECTOMY      Social History   Socioeconomic History   Marital status: Widowed    Spouse name: Not on file   Number of children: Not on file   Years of education: Not on file   Highest education level: Not on file  Occupational History   Occupation: retired    Associate Professor: RETIRED  Tobacco Use   Smoking status: Never   Smokeless tobacco: Never  Vaping Use   Vaping status: Never Used  Substance and Sexual Activity   Alcohol use: No   Drug use: No   Sexual activity: Not Currently  Other Topics Concern   Not on file  Social History Narrative   Is long term resident of [facility]    Social Drivers of Health   Financial Resource Strain: Not on file  Food Insecurity: Not on file  Transportation Needs: Not on file  Physical Activity: Not on file  Stress: Not on file  Social Connections: Not on file  Intimate Partner Violence: Not on file   Family History  Problem Relation Age of Onset   Cancer Other    Heart attack Mother    Cancer Brother    Heart attack Brother    Heart disease Sister        by pass      VITAL SIGNS BP (!) 151/62   Pulse 70   Temp 97.6 F (36.4 C)   Resp 16   Ht 5\' 7"  (1.702 m)   Wt 143 lb 9.6 oz (65.1 kg)   SpO2 (!) 88%   PF (!) 3 L/min   BMI 22.49 kg/m   Outpatient Encounter Medications as of 08/15/2023  Medication Sig   acetaminophen (TYLENOL) 500 MG tablet Take 1,000 mg by mouth 3 (three) times daily. Max 3000mg  in 24 hour period   [employer] (VENELEX) OINT Apply topically. Apply to sacrum, coccyx and bilateral buttocks qshift.   busPIRone (BUSPAR) 15 MG tablet Take 15 mg by mouth 3 (three) times daily.   cloNIDine (CATAPRES) 0.2 MG  tablet Take 0.2 mg by mouth 2 (two) times daily.   dilTIAZem HCl ER 300 MG TB24 Take 1 capsule by mouth daily.   famotidine (PEPCID) 40 MG tablet Take 40 mg by mouth at bedtime.   hydrALAZINE (APRESOLINE) 25 MG tablet Take 25 mg by mouth 3 (three) times daily.   isosorbide dinitrate (ISORDIL) 30 MG tablet Take 30 mg by mouth 2 (two) times daily.   lisinopril (ZESTRIL) 40 MG tablet Take 40 mg by mouth daily. Hypertensive chronic kidney disease with stage 1 through stage 4 chronic kidney disease, or unspecified chronic  kidney disease   metoprolol tartrate (LOPRESSOR) 50 MG tablet Take 50 mg by mouth 2 (two) times daily.   morphine (ROXANOL) 20 MG/ML concentrated solution Take 0.25 mLs (5 mg total) by mouth every 4 (four) hours as needed for severe pain (pain score 7-10).   Nutritional Supplements (ENSURE ENLIVE PO) Take 1 Can by mouth 2 (two) times daily.   polyethylene glycol (MIRALAX / GLYCOLAX) 17 g packet Take 17 g by mouth 2 (two) times daily.   potassium chloride (MICRO-K) 10 MEQ CR capsule Take 40 mEq by mouth daily.   QUEtiapine (SEROQUEL) 25 MG tablet Take 12.5 mg by mouth 2 (two) times daily. 6 AM and 2 PM and 25 mg nightly   saline (AYR) GEL Place 1 Application into both nostrils every 6 (six) hours as needed.   UNABLE TO FIND Diet: DYS 3/Thin Liquids   venlafaxine (EFFEXOR) 75 MG tablet Take 75 mg by mouth 2 (two) times daily with a meal.   No facility-administered encounter medications on file as of 08/15/2023.     SIGNIFICANT DIAGNOSTIC EXAMS  LABS REVIEWED PREVIOUS       06-24-22: hgb 13.0; hct 40.4; tsh 1.153 free t4: 0.95  12-03-22: uric acid: 5.2 12-07-22: wbc 7.3; hgb 13.8; hct 41.5; mcv 87.4 plt 210; glucose 133; bun 24; creat 0.88; k+ 34; na++ 139; ca 9.4 gfr >60 protein 6.4 albumin 3.4  01-21-23: wbc 9.9; hgb 13.8; hct 43.7; mcv 89.2 plt 266; glucose 125; bun 26; creat 0.91; k+ 3.2; na++ 140; ca 9.5; gfr >60; d-dimer 0.48 02-06-23: glucose 99; bun 52; creat 1.27; k+ 4.3; na++  139; ca 9.1 gfr 53; protein 5.5 albumin 2.8; tsh 0.640  NO NEW LABS.   Review of Systems  Unable to perform ROS: Dementia   Physical Exam Constitutional:      General: He is not in acute distress.    Appearance: He is underweight. He is not diaphoretic.  Neck:     Thyroid: No thyromegaly.  Cardiovascular:     Rate and Rhythm: Normal rate and regular rhythm.     Heart sounds: Normal heart sounds.  Pulmonary:     Effort: No respiratory distress.     Comments: Increased effort Diminished sounds  Abdominal:     General: Bowel sounds are normal. There is no distension.     Palpations: Abdomen is soft.     Tenderness: There is no abdominal tenderness.  Musculoskeletal:        General: Normal range of motion.     Cervical back: Neck supple.  Lymphadenopathy:     Cervical: No cervical adenopathy.  Skin:    General: Skin is warm and dry.  Neurological:     Mental Status: He is alert. Mental status is at baseline.  Psychiatric:        Mood and Affect: Mood normal.      ASSESSMENT/ PLAN:  TODAY  Interstitial lung disease Failure to thrive in adult Vascular dementia with behavioral disturbance   Will add; roxanol 5 mg every 5 hours as needed; will continue his current medications; as he is still able to take them.     Synthia Innocent NP [facility]   call [phone]

## 2023-09-09 DEATH — deceased

## 2024-03-24 ENCOUNTER — Encounter (INDEPENDENT_AMBULATORY_CARE_PROVIDER_SITE_OTHER): Payer: Self-pay | Admitting: Gastroenterology
